# Patient Record
Sex: Female | Born: 1985
Health system: Southern US, Community
[De-identification: ages and names within clinical notes are randomized; demographics above are authoritative.]

## PROBLEM LIST (undated history)

## (undated) DIAGNOSIS — S069X9A Unspecified intracranial injury with loss of consciousness of unspecified duration, initial encounter: Secondary | ICD-10-CM

## (undated) DIAGNOSIS — S069XAA Unspecified intracranial injury with loss of consciousness status unknown, initial encounter: Secondary | ICD-10-CM

## (undated) DIAGNOSIS — I10 Essential (primary) hypertension: Secondary | ICD-10-CM

## (undated) DIAGNOSIS — I255 Ischemic cardiomyopathy: Secondary | ICD-10-CM

## (undated) DIAGNOSIS — R87629 Unspecified abnormal cytological findings in specimens from vagina: Secondary | ICD-10-CM

## (undated) DIAGNOSIS — E785 Hyperlipidemia, unspecified: Secondary | ICD-10-CM

## (undated) DIAGNOSIS — I2510 Atherosclerotic heart disease of native coronary artery without angina pectoris: Secondary | ICD-10-CM

## (undated) HISTORY — DX: Hyperlipidemia, unspecified: E78.5

## (undated) HISTORY — DX: Essential (primary) hypertension: I10

## (undated) HISTORY — DX: Atherosclerotic heart disease of native coronary artery without angina pectoris: I25.10

## (undated) HISTORY — DX: Unspecified abnormal cytological findings in specimens from vagina: R87.629

## (undated) HISTORY — DX: Ischemic cardiomyopathy: I25.5

## (undated) HISTORY — PX: CARDIAC CATHETERIZATION: SHX172

---

## 2014-05-06 ENCOUNTER — Encounter: Payer: Self-pay | Admitting: *Deleted

## 2015-02-07 NOTE — L&D Delivery Note (Addendum)
Delivery Note   Date of Delivery:   10/16/2015 Primary OB:   [facility] Gestational Age/EDD: 3968w0d by 10/23/2015, by Last Menstrual Period  Antepartum complications:  OB History    Gravida Para Term Preterm AB Living   3 1 1   1 1    SAB TAB Ectopic Multiple Live Births     1     1      Delivered By:   Vena AustriaStaebler, Andreas MD  Delivery Type:   TSVD Anesthesia:     IV Analgesia  Intrapartum complications:  GBS:     positive Laceration:     not checked (delivery handed over to Dr. Valentino Saxonherry) Episiotomy:    none Placenta:    Spontaneous Estimated Blood Loss:  pending Baby:     Liveborn female  APGAR (1 MIN): pending APGAR (5 MINS): pending Weight: pending Complications: precipitous delivery, thick meconium.  Remainder of delivery note per Dr. Lamar Laundryherry  Called in because baby was delivery precipitously, delivered without difficulty.  Vigorous at birth      Delivery Summary for UAL CorporationShatoya A [name]  Labor Events:   Preterm labor:   Rupture date:   Rupture time:   Rupture type: Artificial  Fluid Color: Moderate Meconium  Induction:   Augmentation:   Complications:   Cervical ripening:          Delivery:   Episiotomy:   Lacerations:   Repair suture:   Repair # of packets:   Blood loss (ml): 400   Information for the patient's newborn:  Arnetha Courserlston, Girl Beverly [name] [[id number]][[id number]]    Delivery 10/16/2015 2:19 PM by  Vaginal, Spontaneous Delivery Sex:  female Gestational Age: 4568w0d Delivery Clinician:   Living?:         APGARS  One minute Five minutes Ten minutes  Skin color:        Heart rate:        Grimace:        Muscle tone:        Breathing:        Totals: 8  9      Presentation/position:      Resuscitation:   Cord information:    Disposition of cord blood:     Blood gases sent?  Complications:   Placenta: Delivered:       appearance Newborn Measurements: Weight: 8 lb 0.8 oz (3650 g)  Height: 19.69"  Head circumference:    Chest circumference:    Other  providers:    Additional  information: Forceps:   Vacuum:   Breech:   Observed anomalies         Delivery Note At 2:19 PM a viable and healthy female was delivered via Vaginal, Spontaneous Delivery (Presentation: Vertex).  APGAR: 8, 9; weight 8 lb 0.8 oz (3650 g).   Placenta status: intact, spontaneous removal.  Cord: 3-vessel with the following complications: None.  Cord pH: Not obtained.  Anesthesia: IV analgesia Episiotomy:  None Lacerations: 1st degree;Perineal Suture Repair: 2.0 Vicryl Est. Blood Loss (mL): 400  Mom to postpartum.  Baby to Couplet care / Skin to Skin.      [name], MD [facility] 10/16/2015 2:57 PM

## 2015-02-19 DIAGNOSIS — Z3169 Encounter for other general counseling and advice on procreation: Secondary | ICD-10-CM | POA: Diagnosis not present

## 2015-02-19 DIAGNOSIS — Z3201 Encounter for pregnancy test, result positive: Secondary | ICD-10-CM | POA: Diagnosis not present

## 2015-03-26 ENCOUNTER — Ambulatory Visit (INDEPENDENT_AMBULATORY_CARE_PROVIDER_SITE_OTHER): Payer: 59 | Admitting: Obstetrics and Gynecology

## 2015-03-26 VITALS — BP 122/78 | HR 83 | Ht 70.0 in | Wt 226.5 lb

## 2015-03-26 DIAGNOSIS — Z1389 Encounter for screening for other disorder: Secondary | ICD-10-CM

## 2015-03-26 DIAGNOSIS — Z36 Encounter for antenatal screening of mother: Secondary | ICD-10-CM | POA: Diagnosis not present

## 2015-03-26 DIAGNOSIS — Z369 Encounter for antenatal screening, unspecified: Secondary | ICD-10-CM

## 2015-03-26 DIAGNOSIS — Z331 Pregnant state, incidental: Secondary | ICD-10-CM

## 2015-03-26 DIAGNOSIS — Z113 Encounter for screening for infections with a predominantly sexual mode of transmission: Secondary | ICD-10-CM

## 2015-03-26 DIAGNOSIS — R638 Other symptoms and signs concerning food and fluid intake: Secondary | ICD-10-CM | POA: Diagnosis not present

## 2015-03-26 DIAGNOSIS — Z3687 Encounter for antenatal screening for uncertain dates: Secondary | ICD-10-CM

## 2015-03-26 DIAGNOSIS — Z3490 Encounter for supervision of normal pregnancy, unspecified, unspecified trimester: Secondary | ICD-10-CM

## 2015-03-26 NOTE — Patient Instructions (Signed)
Minor Illnesses and Medications in Pregnancy  Cold/Flu:  Sudafed for congestion- Robitussin (plain) for cough- Tylenol for discomfort.  Please follow the directions on the label.  Try not to take any more than needed.  OTC Saline nasal spray and air humidifier or cool-mist  Vaporizer to sooth nasal irritation and to loosen congestion.  It is also important to increase intake of non carbonated fluids, especially if you have a fever.  Constipation:  Colace-2 capsules at bedtime; Metamucil- follow directions on label; Senokot- 1 tablet at bedtime.  Any one of these medications can be used.  It is also very important to increase fluids and fruits along with regular exercise.  If problem persists please call the office.  Diarrhea:  Kaopectate as directed on the label.  Eat a bland diet and increase fluids.  Avoid highly seasoned foods.  Headache:  Tylenol 1 or 2 tablets every 3-4 hours as needed  Indigestion:  Maalox, Mylanta, Tums or Rolaids- as directed on label.  Also try to eat small meals and avoid fatty, greasy or spicy foods.  Nausea with or without Vomiting:  Nausea in pregnancy is caused by increased levels of hormones in the body which influence the digestive system and cause irritation when stomach acids accumulate.  Symptoms usually subside after 1st trimester of pregnancy.  Try the following: 1. Keep saltines, graham crackers or dry toast by your bed to eat upon awakening. 2. Don't let your stomach get empty.  Try to eat 5-6 small meals per day instead of 3 large ones. 3. Avoid greasy fatty or highly seasoned foods.  4. Take OTC Unisom 1 tablet at bed time along with OTC Vitamin B6 25-50 mg 3 times per day.    If nausea continues with vomiting and you are unable to keep down food and fluids you may need a prescription medication.  Please notify your provider.   Sore throat:  Chloraseptic spray, throat lozenges and or plain Tylenol.  Vaginal Yeast Infection:  OTC Monistat for 7 days as  directed on label.  If symptoms do not resolve within a week notify provider.  If any of the above problems do not subside with recommended treatment please call the office for further assistance.   Do not take Aspirin, Advil, Motrin or Ibuprofen.  * * OTC= Over the counter Pregnancy and Zika Virus Disease Zika virus disease, or Zika, is an illness that can spread to people from mosquitoes that carry the virus. It may also spread from person to person through infected body fluids. Zika first occurred in [place], but recently it has spread to new areas. The virus occurs in tropical climates. The location of Zika continues to change. Most people who become infected with Zika virus do not develop serious illness. However, Zika may cause birth defects in an unborn baby whose mother is infected with the virus. It may also increase the risk of miscarriage. WHAT ARE THE SYMPTOMS OF ZIKA VIRUS DISEASE? In many cases, people who have been infected with Zika virus do not develop any symptoms. If symptoms appear, they usually start about a week after the person is infected. Symptoms are usually mild. They may include: 2. Fever. 3. Rash. 4. Red eyes. 5. Joint pain. HOW DOES ZIKA VIRUS DISEASE SPREAD? The main way that Zika virus spreads is through the bite of a certain type of mosquito. Unlike most types of mosquitos, which bite only at night, the type of mosquito that carries Zika virus bites both at night and   during the day. Zika virus can also spread through sexual contact, through a blood transfusion, and from a mother to her baby before or during birth. Once you have had Zika virus disease, it is unlikely that you will get it again. CAN I PASS ZIKA TO MY BABY DURING PREGNANCY? Yes, Zika can pass from a mother to her baby before or during birth. WHAT PROBLEMS CAN ZIKA CAUSE FOR MY BABY? A woman who is infected with Zika virus while pregnant is at risk of having her baby born with a condition in which the  brain or head is smaller than expected (microcephaly). Babies who have microcephaly can have developmental delays, seizures, hearing problems, and vision problems. Having Zika virus disease during pregnancy can also increase the risk of miscarriage. HOW CAN ZIKA VIRUS DISEASE BE PREVENTED? There is no vaccine to prevent Zika. The best way to prevent the disease is to avoid infected mosquitoes and avoid exposure to body fluids that can spread the virus. Avoid any possible exposure to Zika by taking the following precautions. For women and their sex partners:  Avoid traveling to high-risk areas. The locations where Zika is being reported change often. To identify high-risk areas, check the CDC travel website: www.cdc.gov/zika/geo/index.html  If you or your sex partner must travel to a high-risk area, talk with a health care provider before and after traveling.  Take all precautions to avoid mosquito bites if you live in, or travel to, any of the high-risk areas. Insect repellents are safe to use during pregnancy.  Ask your health care provider when it is safe to have sexual contact. For women:  If you are pregnant or trying to become pregnant, avoid sexual contact with persons who may have been exposed to Zika virus, persons who have possible symptoms of Zika, or persons whose history you are unsure about. If you choose to have sexual contact with someone who may have been exposed to Zika virus, use condoms correctly during the entire duration of sexual activity, every time. Do not share sexual devices, as you may be exposed to body fluids.  Ask your health care provider about when it is safe to attempt pregnancy after a possible exposure to Zika virus. WHAT STEPS SHOULD I TAKE TO AVOID MOSQUITO BITES? Take these steps to avoid mosquito bites when you are in a high-risk area:  Wear loose clothing that covers your arms and legs.  Limit your outdoor activities.  Do not open windows unless they  have window screens.  Sleep under mosquito nets.  Use insect repellent. The best insect repellents have:  DEET, picaridin, oil of lemon eucalyptus (OLE), or IR3535 in them.  Higher amounts of an active ingredient in them.  Remember that insect repellents are safe to use during pregnancy.  Do not use OLE on children who are younger than 3 years of age. Do not use insect repellent on babies who are younger than 2 months of age.  Cover your child's stroller with mosquito netting. Make sure the netting fits snugly and that any loose netting does not cover your child's mouth or nose. Do not use a blanket as a mosquito-protection cover.  Do not apply insect repellent underneath clothing.  If you are using sunscreen, apply the sunscreen before applying the insect repellent.  Treat clothing with permethrin. Do not apply permethrin directly to your skin. Follow label directions for safe use.  Get rid of standing water, where mosquitoes may reproduce. Standing water is often found in items such   as buckets, bowls, animal food dishes, and flowerpots. When you return from traveling to any high-risk area, continue taking actions to protect yourself against mosquito bites for 3 weeks, even if you show no signs of illness. This will prevent spreading Zika virus to uninfected mosquitoes. WHAT SHOULD I KNOW ABOUT THE SEXUAL TRANSMISSION OF ZIKA? People can spread Zika to their sexual partners during vaginal, anal, or oral sex, or by sharing sexual devices. Many people with Zika do not develop symptoms, so a person could spread the disease without knowing that they are infected. The greatest risk is to women who are pregnant or who may become pregnant. Zika virus can live longer in semen than it can live in blood. Couples can prevent sexual transmission of the virus by:  Using condoms correctly during the entire duration of sexual activity, every time. This includes vaginal, anal, and oral sex.  Not  sharing sexual devices. Sharing increases your risk of being exposed to body fluid from another person.  Avoiding all sexual activity until your health care provider says it is safe. SHOULD I BE TESTED FOR ZIKA VIRUS? A sample of your blood can be tested for Zika virus. A pregnant woman should be tested if she may have been exposed to the virus or if she has symptoms of Zika. She may also have additional tests done during her pregnancy, such ultrasound testing. Talk with your health care provider about which tests are recommended.   This information is not intended to replace advice given to you by your health care provider. Make sure you discuss any questions you have with your health care provider.   Document Released: 10/14/2014 Document Reviewed: 10/07/2014 Elsevier Interactive Patient Education 2016 Else[employer] Hyperemesis Gravidarum Hyperemesis gravidarum is a severe form of nausea and vomiting that happens during pregnancy. Hyperemesis is worse than morning sickness. It may cause you to have nausea or vomiting all day for many days. It may keep you from eating and drinking enough food and liquids. Hyperemesis usually occurs during the first half (the first 20 weeks) of pregnancy. It often goes away once a woman is in her second half of pregnancy. However, sometimes hyperemesis continues through an entire pregnancy.  CAUSES  The cause of this condition is not completely known but is thought to be related to changes in the body's hormones when pregnant. It could be from the high level of the pregnancy hormone or an increase in estrogen in the body.  SIGNS AND SYMPTOMS  6. Severe nausea and vomiting. 7. Nausea that does not go away. 8. Vomiting that does not allow you to keep any food down. 9. Weight loss and body fluid loss (dehydration). 10. Having no desire to eat or not liking food you have previously enjoyed. DIAGNOSIS  Your health care provider will do a physical exam and ask you  about your symptoms. He or she may also order blood tests and urine tests to make sure something else is not causing the problem.  TREATMENT  You may only need medicine to control the problem. If medicines do not control the nausea and vomiting, you will be treated in the hospital to prevent dehydration, increased acid in the blood (acidosis), weight loss, and changes in the electrolytes in your body that may harm the unborn baby (fetus). You may need IV fluids.  HOME CARE INSTRUCTIONS   Only take over-the-counter or prescription medicines as directed by your health care provider.  Try eating a couple of dry crackers or   toast in the morning before getting out of bed.  Avoid foods and smells that upset your stomach.  Avoid fatty and spicy foods.  Eat 5-6 small meals a day.  Do not drink when eating meals. Drink between meals.  For snacks, eat high-protein foods, such as cheese.  Eat or suck on things that have ginger in them. Ginger helps nausea.  Avoid food preparation. The smell of food can spoil your appetite.  Avoid iron pills and iron in your multivitamins until after 3-4 months of being pregnant. However, consult with your health care provider before stopping any prescribed iron pills. SEEK MEDICAL CARE IF:   Your abdominal pain increases.  You have a severe headache.  You have vision problems.  You are losing weight. SEEK IMMEDIATE MEDICAL CARE IF:   You are unable to keep fluids down.  You vomit blood.  You have constant nausea and vomiting.  You have excessive weakness.  You have extreme thirst.  You have dizziness or fainting.  You have a fever or persistent symptoms for more than 2-3 days.  You have a fever and your symptoms suddenly get worse. MAKE SURE YOU:   Understand these instructions.  Will watch your condition.  Will get help right away if you are not doing well or get worse.   This information is not intended to replace advice given to you  by your health care provider. Make sure you discuss any questions you have with your health care provider.   Document Released: 01/23/2005 Document Revised: 11/13/2012 Document Reviewed: 09/04/2012 Elsevier Interactive Patient Education 2016 Elsevier Inc. Commonly Asked Questions During Pregnancy  Cats: A parasite can be excreted in cat feces.  To avoid exposure you need to have another person empty the little box.  If you must empty the litter box you will need to wear gloves.  Wash your hands after handling your cat.  This parasite can also be found in raw or undercooked meat so this should also be avoided.  Colds, Sore Throats, Flu: Please check your medication sheet to see what you can take for symptoms.  If your symptoms are unrelieved by these medications please call the office.  Dental Work: Most any dental work your dentist recommends is permitted.  X-rays should only be taken during the first trimester if absolutely necessary.  Your abdomen should be shielded with a lead apron during all x-rays.  Please notify your provider prior to receiving any x-rays.  Novocaine is fine; gas is not recommended.  If your dentist requires a note from us prior to dental work please call the office and we will provide one for you.  Exercise: Exercise is an important part of staying healthy during your pregnancy.  You may continue most exercises you were accustomed to prior to pregnancy.  Later in your pregnancy you will most likely notice you have difficulty with activities requiring balance like riding a bicycle.  It is important that you listen to your body and avoid activities that put you at a higher risk of falling.  Adequate rest and staying well hydrated are a must!  If you have questions about the safety of specific activities ask your provider.    Exposure to Children with illness: Try to avoid obvious exposure; report any symptoms to us when noted,  If you have chicken pos, red measles or mumps, you  should be immune to these diseases.   Please do not take any vaccines while pregnant unless you have checked with   your OB provider.  Fetal Movement: After 28 weeks we recommend you do "kick counts" twice daily.  Lie or sit down in a calm quiet environment and count your baby movements "kicks".  You should feel your baby at least 10 times per hour.  If you have not felt 10 kicks within the first hour get up, walk around and have something sweet to eat or drink then repeat for an additional hour.  If count remains less than 10 per hour notify your provider.  Fumigating: Follow your pest control agent's advice as to how long to stay out of your home.  Ventilate the area well before re-entering.  Hemorrhoids:   Most over-the-counter preparations can be used during pregnancy.  Check your medication to see what is safe to use.  It is important to use a stool softener or fiber in your diet and to drink lots of liquids.  If hemorrhoids seem to be getting worse please call the office.   Hot Tubs:  Hot tubs Jacuzzis and saunas are not recommended while pregnant.  These increase your internal body temperature and should be avoided.  Intercourse:  Sexual intercourse is safe during pregnancy as long as you are comfortable, unless otherwise advised by your provider.  Spotting may occur after intercourse; report any bright red bleeding that is heavier than spotting.  Labor:  If you know that you are in labor, please go to the hospital.  If you are unsure, please call the office and let us help you decide what to do.  Lifting, straining, etc:  If your job requires heavy lifting or straining please check with your provider for any limitations.  Generally, you should not lift items heavier than that you can lift simply with your hands and arms (no back muscles)  Painting:  Paint fumes do not harm your pregnancy, but may make you ill and should be avoided if possible.  Latex or water based paints have less odor than  oils.  Use adequate ventilation while painting.  Permanents & Hair Color:  Chemicals in hair dyes are not recommended as they cause increase hair dryness which can increase hair loss during pregnancy.  " Highlighting" and permanents are allowed.  Dye may be absorbed differently and permanents may not hold as well during pregnancy.  Sunbathing:  Use a sunscreen, as skin burns easily during pregnancy.  Drink plenty of fluids; avoid over heating.  Tanning Beds:  Because their possible side effects are still unknown, tanning beds are not recommended.  Ultrasound Scans:  Routine ultrasounds are performed at approximately 20 weeks.  You will be able to see your baby's general anatomy an if you would like to know the gender this can usually be determined as well.  If it is questionable when you conceived you may also receive an ultrasound early in your pregnancy for dating purposes.  Otherwise ultrasound exams are not routinely performed unless there is a medical necessity.  Although you can request a scan we ask that you pay for it when conducted because insurance does not cover " patient request" scans.  Work: If your pregnancy proceeds without complications you may work until your due date, unless your physician or employer advises otherwise.  Round Ligament Pain/Pelvic Discomfort:  Sharp, shooting pains not associated with bleeding are fairly common, usually occurring in the second trimester of pregnancy.  They tend to be worse when standing up or when you remain standing for long periods of time.  These are the result   of pressure of certain pelvic ligaments called "round ligaments".  Rest, Tylenol and heat seem to be the most effective relief.  As the womb and fetus grow, they rise out of the pelvis and the discomfort improves.  Please notify the office if your pain seems different than that described.  It may represent a more serious condition.   

## 2015-03-26 NOTE — Progress Notes (Signed)
I have reviewed the record and concur with information as documented by Fenton Malling, LPN.

## 2015-03-26 NOTE — Progress Notes (Signed)
   Carmin A Raul Del presents for NOB nurse interview visit. G-3.  [id number]. Pregnancy confirmation at [facility] On 02/19/2015 with EDD: 10/24/2015. Pregnancy education material explained and given. No cats in the home. NOB labs ordered. TSH/HbgA1c due to Increased BMI, HIV labs and Drug screen were explained optional and she could opt out of tests but did not decline. Drug screen ordered. Pt has smoked marijuana/cigarettes and stopped after she found out she was pregnant. PNV encouraged. NT to discuss with provider. To schedule ultrasound for dating/viability. Pt. To follow up with provider in 3 weeks for NOB physical.  All questions answered.  ZIKA EXPOSURE SCREEN:  The patient has not traveled to a [place] Virus endemic area within the past 6 months, nor has she had unprotected sex with a partner who has travelled to a [place] endemic region within the past 6 months. The patient has been advised to notify us if these factors change any time during this current pregnancy, so adequate testing and monitoring can be initiated.

## 2015-03-27 LAB — PAIN MGT SCRN (14 DRUGS), UR
AMPHETAMINE SCRN UR: NEGATIVE ng/mL
BUPRENORPHINE, URINE: NEGATIVE ng/mL
Barbiturate Screen, Ur: NEGATIVE ng/mL
Benzodiazepine Screen, Urine: NEGATIVE ng/mL
CANNABINOIDS UR QL SCN: POSITIVE ng/mL
COCAINE(METAB.) SCREEN, URINE: NEGATIVE ng/mL
Creatinine(Crt), U: 308.7 mg/dL — ABNORMAL HIGH (ref 20.0–300.0)
FENTANYL, URINE: NEGATIVE pg/mL
MEPERIDINE SCREEN, URINE: NEGATIVE ng/mL
METHADONE SCREEN, URINE: NEGATIVE ng/mL
OPIATE SCRN UR: NEGATIVE ng/mL
Oxycodone+Oxymorphone Ur Ql Scn: NEGATIVE ng/mL
PCP SCRN UR: NEGATIVE ng/mL
PROPOXYPHENE SCREEN: NEGATIVE ng/mL
Ph of Urine: 5.8 (ref 4.5–8.9)
TRAMADOL UR QL SCN: NEGATIVE ng/mL

## 2015-03-27 LAB — URINALYSIS, ROUTINE W REFLEX MICROSCOPIC
BILIRUBIN UA: NEGATIVE
GLUCOSE, UA: NEGATIVE
LEUKOCYTES UA: NEGATIVE
Nitrite, UA: NEGATIVE
PH UA: 6 (ref 5.0–7.5)
RBC UA: NEGATIVE
UUROB: 1 mg/dL (ref 0.2–1.0)

## 2015-03-27 LAB — NICOTINE SCREEN, URINE: COTININE UR QL SCN: NEGATIVE ng/mL

## 2015-03-28 LAB — CULTURE, OB URINE

## 2015-03-28 LAB — URINE CULTURE, OB REFLEX

## 2015-03-29 LAB — GC/CHLAMYDIA PROBE AMP
CHLAMYDIA, DNA PROBE: NEGATIVE
NEISSERIA GONORRHOEAE BY PCR: NEGATIVE

## 2015-03-30 LAB — ANTIBODY SCREEN: Antibody Screen: NEGATIVE

## 2015-03-30 LAB — HIV ANTIBODY (ROUTINE TESTING W REFLEX): HIV Screen 4th Generation wRfx: NONREACTIVE

## 2015-03-30 LAB — VARICELLA ZOSTER ANTIBODY, IGM: Varicella IgM: <0.91 index (ref 0.00–0.90)

## 2015-03-30 LAB — CBC WITH DIFFERENTIAL/PLATELET
BASOS: 0 %
Basophils Absolute: 0 10*3/uL (ref 0.0–0.2)
EOS (ABSOLUTE): 0.1 10*3/uL (ref 0.0–0.4)
EOS: 1 %
HEMATOCRIT: 35.3 % (ref 34.0–46.6)
Hemoglobin: 11.9 g/dL (ref 11.1–15.9)
IMMATURE GRANULOCYTES: 0 %
Immature Grans (Abs): 0 10*3/uL (ref 0.0–0.1)
LYMPHS ABS: 2.8 10*3/uL (ref 0.7–3.1)
Lymphs: 31 %
MCH: 30 pg (ref 26.6–33.0)
MCHC: 33.7 g/dL (ref 31.5–35.7)
MCV: 89 fL (ref 79–97)
MONOS ABS: 0.6 10*3/uL (ref 0.1–0.9)
Monocytes: 7 %
Neutrophils Absolute: 5.4 10*3/uL (ref 1.4–7.0)
Neutrophils: 61 %
Platelets: 434 10*3/uL — ABNORMAL HIGH (ref 150–379)
RBC: 3.97 x10E6/uL (ref 3.77–5.28)
RDW: 14.3 % (ref 12.3–15.4)
WBC: 9 10*3/uL (ref 3.4–10.8)

## 2015-03-30 LAB — HEMOGLOBIN A1C
Est. average glucose Bld gHb Est-mCnc: 120 mg/dL
Hgb A1c MFr Bld: 5.8 % — ABNORMAL HIGH (ref 4.8–5.6)

## 2015-03-30 LAB — RUBELLA ANTIBODY, IGM

## 2015-03-30 LAB — RH TYPE: Rh Factor: POSITIVE

## 2015-03-30 LAB — ABO

## 2015-03-30 LAB — HEPATITIS B SURFACE ANTIGEN: HEP B S AG: NEGATIVE

## 2015-03-30 LAB — SICKLE CELL SCREEN: Sickle Cell Screen: NEGATIVE

## 2015-03-30 LAB — RPR: RPR Ser Ql: NONREACTIVE

## 2015-03-30 LAB — TSH: TSH: 1.38 u[IU]/mL (ref 0.450–4.500)

## 2015-04-09 ENCOUNTER — Ambulatory Visit (INDEPENDENT_AMBULATORY_CARE_PROVIDER_SITE_OTHER): Payer: 59

## 2015-04-09 DIAGNOSIS — Z3687 Encounter for antenatal screening for uncertain dates: Secondary | ICD-10-CM

## 2015-04-09 DIAGNOSIS — Z36 Encounter for antenatal screening of mother: Secondary | ICD-10-CM | POA: Diagnosis not present

## 2015-04-09 DIAGNOSIS — Z331 Pregnant state, incidental: Secondary | ICD-10-CM

## 2015-04-09 DIAGNOSIS — Z3490 Encounter for supervision of normal pregnancy, unspecified, unspecified trimester: Secondary | ICD-10-CM

## 2015-04-22 ENCOUNTER — Ambulatory Visit (INDEPENDENT_AMBULATORY_CARE_PROVIDER_SITE_OTHER): Payer: 59 | Admitting: Obstetrics and Gynecology

## 2015-04-22 ENCOUNTER — Encounter: Payer: Self-pay | Admitting: Obstetrics and Gynecology

## 2015-04-22 VITALS — BP 111/72 | HR 73 | Wt 230.5 lb

## 2015-04-22 DIAGNOSIS — O99210 Obesity complicating pregnancy, unspecified trimester: Secondary | ICD-10-CM

## 2015-04-22 DIAGNOSIS — Z124 Encounter for screening for malignant neoplasm of cervix: Secondary | ICD-10-CM

## 2015-04-22 DIAGNOSIS — Z3482 Encounter for supervision of other normal pregnancy, second trimester: Secondary | ICD-10-CM | POA: Diagnosis not present

## 2015-04-22 DIAGNOSIS — R110 Nausea: Secondary | ICD-10-CM

## 2015-04-22 DIAGNOSIS — Z3492 Encounter for supervision of normal pregnancy, unspecified, second trimester: Secondary | ICD-10-CM

## 2015-04-22 DIAGNOSIS — Z8742 Personal history of other diseases of the female genital tract: Secondary | ICD-10-CM

## 2015-04-22 DIAGNOSIS — R7303 Prediabetes: Secondary | ICD-10-CM

## 2015-04-22 LAB — POCT URINALYSIS DIPSTICK
Bilirubin, UA: NEGATIVE
Blood, UA: NEGATIVE
Glucose, UA: NEGATIVE
Ketones, UA: 5
LEUKOCYTES UA: NEGATIVE
Nitrite, UA: NEGATIVE
PH UA: 7.5
PROTEIN UA: NEGATIVE
Spec Grav, UA: 1.01
UROBILINOGEN UA: NEGATIVE

## 2015-04-22 MED ORDER — METRONIDAZOLE 500 MG PO TABS: 500.0000 mg | ORAL_TABLET | Freq: Two times a day (BID) | ORAL | Status: DC

## 2015-04-22 NOTE — Progress Notes (Signed)
OBSTETRIC INITIAL PRENATAL VISIT  Subjective:    Beverly [name] is being seen today for her first obstetrical visit.  This is not a planned pregnancy. She is a 30 y.o. 763P1011 female at 9252w5d gestation, Estimated Date of Delivery: 10/23/15 with last menstrual period of 01/16/2015 (approximate), consistent with 7 week sono. Her obstetrical history is significant for obesity (Class I). Relationship with FOB: significant other, not living together. Patient does intend to breast feed. Pregnancy history fully reviewed.  Patient was previously on OCPs (however notes seldomly missing a pill) at time of conception (on pills x 5 months, transitioning from a Nexplanon).    Obstetric History   G3   P1   T1   P0   A1   TAB1   SAB0   E0   M0   L1     # Outcome Date GA Lbr Len/2nd Weight Sex Delivery Anes PTL Lv  3 Current           2 Term 06/10/11 6722w0d  7 lb 5 oz (3.317 kg) M Vag-Spont  N Y  1 TAB 2012        FD      Gynecologic History:  Last pap smear was 2012.  Results were normal.  Admits h/o abnormal pap smear in the past (~age 24, thinks it was HPV+). Was just followed, per patient.  Denies history of STIs.    Past Medical History  Diagnosis Date  . Vaginal Pap smear, abnormal     when she was 30yo  . Hypertension     last pregnancy    Family History  Problem Relation Age of Onset  . Diabetes Maternal Grandmother   . Diabetes Paternal Grandmother   . Hyperlipidemia Mother   . Hypertension Mother   . Rashes / Skin problems Son     ezcema  . Seizures Son   . Seizures Maternal Grandmother   . Thyroid disease Maternal Grandmother     History reviewed. No pertinent past surgical history.    Social History   Social History  . Marital Status: Single    Spouse Name: N/A  . Number of Children: N/A  . 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 Smoking status: Former Games developermoker  . Smokeless tobacco: Never Used  .  Alcohol Use: No  . Drug Use: 5.00 per week    Special: Marijuana     Comment: stopped when she found out she was pregnant  . Sexual Activity:    Partners: Male    Pharmacist, hospitalBirth Control/ Protection: None   Other Topics Concern  . Not on file   Social History Narrative    Current Outpatient Prescriptions on File Prior to Visit  Medication Sig Dispense Refill  . Prenatal Vit-Fe Fumarate-FA (PRENATAL MULTIVITAMIN) TABS tablet Take 1 tablet by mouth daily at 12 noon.     No current facility-administered medications on file prior to visit.    No Known Allergies   Review of Systems General:Positive -Weight Loss; Not Present- Fever, Weight Gain. Skin:Not Present- Rash. HEENT:Not Present- Blurred Vision, Headache and Bleeding Gums. Respiratory:Not Present- Difficulty Breathing. Breast:Not Present- Breast Mass. Cardiovascular:Not Present- Chest Pain, Elevated Blood Pressure, Fainting / Blacking Out and Shortness of Breath. Gastrointestinal:Present - Nausea. Not Present- Abdominal Pain, Constipation, Vomiting. Female Genitourinary:Present - Vaginal discharge (x 2 months, with odor, thin, white). Not Present- Frequency, Painful Urination, Pelvic Pain, Vaginal Bleeding, Vaginal Discharge,  Contractions, regular, Fetal Movements Decreased, Urinary Complaints and Vaginal Fluid. Musculoskeletal:Not Present- Back Pain and Leg Cramps. Neurological:Not Present- Dizziness. Psychiatric:Not Present- Depression.     Objective:   Blood pressure 111/72, pulse 73, weight 230 lb 8 oz (104.554 kg), last menstrual period 01/16/2015.  Body mass index is 33.07 kg/(m^2).  General Appearance:    Alert, cooperative, no distress, appears stated age, mild obesity  Head:    Normocephalic, without obvious abnormality, atraumatic  Eyes:    PERRL, conjunctiva/corneas clear, EOM's intact, both eyes  Ears:    Normal external ear canals, both ears  Nose:   Nares normal, septum midline, mucosa normal, no  drainage or sinus tenderness  Throat:   Lips, mucosa, and tongue normal; teeth and gums normal  Neck:   Supple, symmetrical, trachea midline, no adenopathy; thyroid: no enlargement/tenderness/nodules; no carotid bruit or JVD  Back:     Symmetric, no curvature, ROM normal, no CVA tenderness  Lungs:     Clear to auscultation bilaterally, respirations unlabored  Chest Wall:    No tenderness or deformity   Heart:    Regular rate and rhythm, S1 and S2 normal, no murmur, rub or gallop  Breast Exam:    No tenderness, masses, or nipple abnormality  Abdomen:     Soft, non-tender, bowel sounds active all four quadrants, no masses, no organomegaly.  FHT 153 bpm.  Genitalia:    Pelvic:external genitalia normal, vagina without lesions or tenderness, rectovaginal septum normal.  Moderate amount of thin white discharge, positive whiff test. Cervix normal in appearance, no cervical motion tenderness, no adnexal masses or tenderness.  Pregnancy positive findings: uterine enlargement: 13 wk size, nontender.   Rectal:    Normal external sphincter.  No hemorrhoids appreciated. Internal exam not done.   Extremities:   Extremities normal, atraumatic, no cyanosis or edema  Pulses:   2+ and symmetric all extremities  Skin:   Skin color, texture, turgor normal, no rashes or lesions  Lymph nodes:   Cervical, supraclavicular, and axillary nodes normal  Neurologic:   CNII-XII intact, normal strength, sensation and reflexes throughout      Assessment:   Pregnancy at 13 and 5/7 weeks   Nausea of pregnancy Bacterial vaginosis Mild obesity (Class I) Remote h/o abnormal pap    Plan:   Initial labs reviewed. Pap smear performed today.  Prenatal vitamins encouraged. Problem list reviewed and updated. Counseled on Vitamin B6 and doxylamine.  Patient notes mostly food aversions with associated nausea.  Counseled on avoiding certain foods that can cause intolerance.  Mild obesity with elevated HgbA1c (5.8%).  Will need  early glucola.  To be performed next visit.  Bacterial vaginosis to be treated with Flagyl 500 mg BID x 7 days.  Discussed diagnosis, hygiene precautions.  New OB counseling:  The patient has been given an overview regarding routine prenatal care.  Recommendations regarding diet, weight gain, and exercise in pregnancy were given. Prenatal testing, optional genetic testing, and ultrasound use in pregnancy were reviewed.  AFP3 discussed: undecided.  Given information on Panorama and 1st trimester screen.  Patient understands that if serum screen desired, needs to be performed by next week, otherwise, would need to have 2nd trimester screen.  Benefits of Breast Feeding were discussed. The patient is encouraged to consider nursing her baby post partum. Follow up in 4 weeks.  50% of 30 min visit spent on counseling and coordination of care.

## 2015-04-22 NOTE — Patient Instructions (Signed)

## 2015-04-26 ENCOUNTER — Ambulatory Visit: Payer: Self-pay | Admitting: Physician Assistant

## 2015-04-26 ENCOUNTER — Encounter: Payer: Self-pay | Admitting: Physician Assistant

## 2015-04-26 VITALS — BP 112/70 | Temp 98.5°F

## 2015-04-26 DIAGNOSIS — R21 Rash and other nonspecific skin eruption: Secondary | ICD-10-CM

## 2015-04-26 NOTE — Progress Notes (Signed)
S: c/o rash on abdomen, itchy, no pain, no fever/chills, pt is 14 weeks preg  O: vitals wnl, nad, skin on abdomen with several herald patch appearing lesions, no scaling, no drainage, no vesicles, n/v intact  A: rash  P: reassurance, may just be from pregnancy

## 2015-04-28 ENCOUNTER — Telehealth: Payer: Self-pay | Admitting: Obstetrics and Gynecology

## 2015-04-28 DIAGNOSIS — O2686 Pruritic urticarial papules and plaques of pregnancy (PUPPP): Secondary | ICD-10-CM

## 2015-04-28 LAB — PAP IG, CT-NG, RFX HPV ASCU
CHLAMYDIA, NUC. ACID AMP: NEGATIVE
Gonococcus by Nucleic Acid Amp: NEGATIVE
PAP Smear Comment: 0

## 2015-04-28 MED ORDER — HYDROCORTISONE 2.5 % EX CREA: TOPICAL_CREAM | Freq: Two times a day (BID) | CUTANEOUS | Status: DC

## 2015-04-28 NOTE — Telephone Encounter (Signed)
Called pt no answer, LM informing her that rx would be sent in for hydrocortisone 2.5% cream.

## 2015-04-28 NOTE — Telephone Encounter (Signed)
She went to employee health with rash on her stomach and thigh and breast area. She said they called it pupps. Is there something she can put on it, they wouldn't give her anything b/c she is pregnant. She is about to scratch herself to death. It is very itchy.

## 2015-05-19 ENCOUNTER — Encounter: Payer: 59 | Admitting: Obstetrics and Gynecology

## 2015-05-20 ENCOUNTER — Other Ambulatory Visit: Payer: 59

## 2015-06-11 ENCOUNTER — Other Ambulatory Visit: Payer: Self-pay | Admitting: Obstetrics and Gynecology

## 2015-06-11 ENCOUNTER — Other Ambulatory Visit: Payer: 59

## 2015-06-11 ENCOUNTER — Telehealth: Payer: Self-pay | Admitting: Obstetrics and Gynecology

## 2015-06-11 DIAGNOSIS — Z3493 Encounter for supervision of normal pregnancy, unspecified, third trimester: Secondary | ICD-10-CM

## 2015-06-11 DIAGNOSIS — O99210 Obesity complicating pregnancy, unspecified trimester: Secondary | ICD-10-CM | POA: Diagnosis not present

## 2015-06-11 DIAGNOSIS — Z131 Encounter for screening for diabetes mellitus: Secondary | ICD-10-CM

## 2015-06-11 DIAGNOSIS — R7303 Prediabetes: Secondary | ICD-10-CM | POA: Diagnosis not present

## 2015-06-11 DIAGNOSIS — Z3492 Encounter for supervision of normal pregnancy, unspecified, second trimester: Secondary | ICD-10-CM

## 2015-06-12 LAB — GLUCOSE, 1 HOUR GESTATIONAL: Gestational Diabetes Screen: 135 mg/dL (ref 65–139)

## 2015-06-22 ENCOUNTER — Encounter: Payer: 59 | Admitting: Obstetrics and Gynecology

## 2015-06-22 ENCOUNTER — Ambulatory Visit (INDEPENDENT_AMBULATORY_CARE_PROVIDER_SITE_OTHER): Payer: 59 | Admitting: Obstetrics and Gynecology

## 2015-06-22 ENCOUNTER — Ambulatory Visit (INDEPENDENT_AMBULATORY_CARE_PROVIDER_SITE_OTHER): Payer: 59

## 2015-06-22 VITALS — BP 107/67 | HR 92 | Wt 231.0 lb

## 2015-06-22 DIAGNOSIS — Z1379 Encounter for other screening for genetic and chromosomal anomalies: Secondary | ICD-10-CM | POA: Diagnosis not present

## 2015-06-22 DIAGNOSIS — Z3492 Encounter for supervision of normal pregnancy, unspecified, second trimester: Secondary | ICD-10-CM

## 2015-06-22 DIAGNOSIS — Z3482 Encounter for supervision of other normal pregnancy, second trimester: Secondary | ICD-10-CM

## 2015-06-22 DIAGNOSIS — O0932 Supervision of pregnancy with insufficient antenatal care, second trimester: Secondary | ICD-10-CM

## 2015-06-22 LAB — POCT URINALYSIS DIPSTICK
BILIRUBIN UA: NEGATIVE
Glucose, UA: NEGATIVE
KETONES UA: NEGATIVE
Nitrite, UA: NEGATIVE
PH UA: 6
RBC UA: NEGATIVE
Spec Grav, UA: >=1.03
Urobilinogen, UA: NEGATIVE

## 2015-06-22 NOTE — Progress Notes (Signed)
ROB: Patient doing well.  Denies complaints. Missed last appointment due to son being ill, just now able to reschedule. S/p anatomy scan today (normal but incomplete). Desires Panorama, will order.  RTC in 4 weeks.

## 2015-07-06 ENCOUNTER — Encounter: Payer: Self-pay | Admitting: Obstetrics and Gynecology

## 2015-07-29 ENCOUNTER — Ambulatory Visit (INDEPENDENT_AMBULATORY_CARE_PROVIDER_SITE_OTHER): Payer: 59 | Admitting: Obstetrics and Gynecology

## 2015-07-29 ENCOUNTER — Ambulatory Visit (INDEPENDENT_AMBULATORY_CARE_PROVIDER_SITE_OTHER): Payer: 59

## 2015-07-29 VITALS — BP 104/64 | HR 85 | Wt 237.2 lb

## 2015-07-29 DIAGNOSIS — Z23 Encounter for immunization: Secondary | ICD-10-CM

## 2015-07-29 DIAGNOSIS — R638 Other symptoms and signs concerning food and fluid intake: Secondary | ICD-10-CM

## 2015-07-29 DIAGNOSIS — Z3493 Encounter for supervision of normal pregnancy, unspecified, third trimester: Secondary | ICD-10-CM | POA: Diagnosis not present

## 2015-07-29 DIAGNOSIS — Z131 Encounter for screening for diabetes mellitus: Secondary | ICD-10-CM

## 2015-07-29 DIAGNOSIS — Z3482 Encounter for supervision of other normal pregnancy, second trimester: Secondary | ICD-10-CM | POA: Diagnosis not present

## 2015-07-29 DIAGNOSIS — Z3492 Encounter for supervision of normal pregnancy, unspecified, second trimester: Secondary | ICD-10-CM

## 2015-07-29 LAB — POCT URINALYSIS DIPSTICK
Bilirubin, UA: NEGATIVE
GLUCOSE UA: NEGATIVE
Ketones, UA: NEGATIVE
NITRITE UA: NEGATIVE
RBC UA: NEGATIVE
Spec Grav, UA: 1.01
UROBILINOGEN UA: NEGATIVE
pH, UA: 7.5

## 2015-07-29 MED ORDER — TETANUS-DIPHTH-ACELL PERTUSSIS 5-2.5-18.5 LF-MCG/0.5 IM SUSP
0.5000 mL | Freq: Once | INTRAMUSCULAR | Status: AC
  Administered 2015-07-29: 0.5 mL via INTRAMUSCULAR

## 2015-07-29 NOTE — Progress Notes (Signed)
ROB: Denies complaints. For 28 week labs next visit (patient to late in day to perform glucola). Discussed cord blood banking. Tdap given today, blood consent signed. Discussed contraception, desires Nexplanon. Desires to breast feed. Patient's TWG ok as she has gained back what was lost during early portion of pregnancy (TWG thus 11 lbs). RTC in 2 weeks.

## 2015-07-30 ENCOUNTER — Other Ambulatory Visit: Payer: 59

## 2015-08-20 ENCOUNTER — Other Ambulatory Visit: Payer: 59

## 2015-08-20 DIAGNOSIS — Z131 Encounter for screening for diabetes mellitus: Secondary | ICD-10-CM | POA: Diagnosis not present

## 2015-08-20 DIAGNOSIS — Z3493 Encounter for supervision of normal pregnancy, unspecified, third trimester: Secondary | ICD-10-CM | POA: Diagnosis not present

## 2015-08-21 LAB — GLUCOSE, 1 HOUR GESTATIONAL: Gestational Diabetes Screen: 120 mg/dL (ref 65–139)

## 2015-08-21 LAB — CBC
HEMATOCRIT: 29.9 % — AB (ref 34.0–46.6)
HEMOGLOBIN: 9.4 g/dL — AB (ref 11.1–15.9)
MCH: 27.5 pg (ref 26.6–33.0)
MCHC: 31.4 g/dL — ABNORMAL LOW (ref 31.5–35.7)
MCV: 87 fL (ref 79–97)
Platelets: 331 10*3/uL (ref 150–379)
RBC: 3.42 x10E6/uL — AB (ref 3.77–5.28)
RDW: 15 % (ref 12.3–15.4)
WBC: 7.9 10*3/uL (ref 3.4–10.8)

## 2015-08-27 ENCOUNTER — Telehealth: Payer: Self-pay

## 2015-08-27 DIAGNOSIS — D509 Iron deficiency anemia, unspecified: Secondary | ICD-10-CM

## 2015-08-27 DIAGNOSIS — O99013 Anemia complicating pregnancy, third trimester: Principal | ICD-10-CM

## 2015-08-27 MED ORDER — FERROUS SULFATE 325 (65 FE) MG PO TABS: 325.0000 mg | ORAL_TABLET | Freq: Every day | ORAL | Status: DC

## 2015-08-27 NOTE — Telephone Encounter (Signed)
-----   Message from Hildred LaserAnika Cherry, MD sent at 08/25/2015 12:00 AM EDT ----- Please inform patient of normal glucola, but needs to begin iron supplement twice daily for anemia.  She can get OTC or we can prescribe her something.

## 2015-08-27 NOTE — Telephone Encounter (Signed)
Called pt no answer, LM for pt informing her of information below. RX sent in for iron supplement

## 2015-09-01 ENCOUNTER — Encounter: Payer: Self-pay | Admitting: Obstetrics and Gynecology

## 2015-09-01 ENCOUNTER — Ambulatory Visit (INDEPENDENT_AMBULATORY_CARE_PROVIDER_SITE_OTHER): Payer: 59 | Admitting: Obstetrics and Gynecology

## 2015-09-01 VITALS — BP 104/66 | HR 97 | Wt 241.7 lb

## 2015-09-01 DIAGNOSIS — R638 Other symptoms and signs concerning food and fluid intake: Secondary | ICD-10-CM

## 2015-09-01 DIAGNOSIS — Z3483 Encounter for supervision of other normal pregnancy, third trimester: Secondary | ICD-10-CM

## 2015-09-01 DIAGNOSIS — O99019 Anemia complicating pregnancy, unspecified trimester: Secondary | ICD-10-CM

## 2015-09-01 DIAGNOSIS — D509 Iron deficiency anemia, unspecified: Secondary | ICD-10-CM | POA: Insufficient documentation

## 2015-09-01 DIAGNOSIS — Z3493 Encounter for supervision of normal pregnancy, unspecified, third trimester: Secondary | ICD-10-CM

## 2015-09-01 DIAGNOSIS — O99013 Anemia complicating pregnancy, third trimester: Secondary | ICD-10-CM

## 2015-09-01 LAB — POCT URINALYSIS DIPSTICK
BILIRUBIN UA: NEGATIVE
GLUCOSE UA: NEGATIVE
KETONES UA: 40
NITRITE UA: NEGATIVE
PH UA: 6.5
RBC UA: NEGATIVE
Spec Grav, UA: >=1.03
Urobilinogen, UA: NEGATIVE

## 2015-09-01 MED ORDER — PRENATAL GUMMIES/DHA & FA 0.4-32.5 MG PO CHEW: 2.0000 | CHEWABLE_TABLET | Freq: Every day | ORAL | 3 refills | Status: DC

## 2015-09-01 MED ORDER — POLYSACCHARIDE IRON COMPLEX 150 MG PO CAPS: 150.0000 mg | ORAL_CAPSULE | Freq: Every day | ORAL | Status: DC

## 2015-09-01 NOTE — Progress Notes (Signed)
ROB: Patient denies complaints.  Notes that she has to space visits longer due to her son's multiple doctor's appointments.  S/p normal 1 hr glucola.  Mild anemia noted.  Advised on taking iron tablet daily.  Also encouraged patient to take PNV.  Notes difficulty swallowing. Advised on gummies. RTC in 3 weeks. For 36 week labs at that time.

## 2015-09-22 ENCOUNTER — Encounter: Payer: 59 | Admitting: Obstetrics and Gynecology

## 2015-09-22 NOTE — Progress Notes (Signed)
Patient left before seeing provider.

## 2015-09-28 ENCOUNTER — Ambulatory Visit (INDEPENDENT_AMBULATORY_CARE_PROVIDER_SITE_OTHER): Payer: 59 | Admitting: Obstetrics and Gynecology

## 2015-09-28 VITALS — BP 100/63 | HR 88 | Wt 241.6 lb

## 2015-09-28 DIAGNOSIS — Z3493 Encounter for supervision of normal pregnancy, unspecified, third trimester: Secondary | ICD-10-CM

## 2015-09-28 DIAGNOSIS — O2613 Low weight gain in pregnancy, third trimester: Secondary | ICD-10-CM

## 2015-09-28 DIAGNOSIS — Z113 Encounter for screening for infections with a predominantly sexual mode of transmission: Secondary | ICD-10-CM | POA: Diagnosis not present

## 2015-09-28 DIAGNOSIS — Z36 Encounter for antenatal screening of mother: Secondary | ICD-10-CM | POA: Diagnosis not present

## 2015-09-28 DIAGNOSIS — Z3685 Encounter for antenatal screening for Streptococcus B: Secondary | ICD-10-CM

## 2015-09-28 DIAGNOSIS — O2610 Low weight gain in pregnancy, unspecified trimester: Secondary | ICD-10-CM | POA: Insufficient documentation

## 2015-09-28 DIAGNOSIS — Z3483 Encounter for supervision of other normal pregnancy, third trimester: Secondary | ICD-10-CM

## 2015-09-28 LAB — POCT URINALYSIS DIPSTICK
BILIRUBIN UA: NEGATIVE
Blood, UA: NEGATIVE
Glucose, UA: NEGATIVE
Ketones, UA: 40
NITRITE UA: NEGATIVE
PH UA: 6
Spec Grav, UA: 1.025
Urobilinogen, UA: 0.2

## 2015-09-28 NOTE — Progress Notes (Signed)
ROB: Denies complaints. Does note CSX CorporationBraxton Hicks.  Discussed labor precautions. 36 week labs done today.  TWG 9 lbs (however patient had also lost 6 lbs in 1st trimester, so has truly gained total of 15 lbs this pregnancy).  RTC in 1-2 weeks (needs spacing due to schedule).

## 2015-10-01 LAB — GC/CHLAMYDIA PROBE AMP
Chlamydia trachomatis, NAA: NEGATIVE
NEISSERIA GONORRHOEAE BY PCR: NEGATIVE

## 2015-10-02 LAB — CULTURE, BETA STREP (GROUP B ONLY): STREP GP B CULTURE: POSITIVE — AB

## 2015-10-12 ENCOUNTER — Ambulatory Visit (INDEPENDENT_AMBULATORY_CARE_PROVIDER_SITE_OTHER): Payer: 59 | Admitting: Obstetrics and Gynecology

## 2015-10-12 VITALS — BP 112/70 | HR 90 | Wt 241.9 lb

## 2015-10-12 DIAGNOSIS — O26899 Other specified pregnancy related conditions, unspecified trimester: Secondary | ICD-10-CM

## 2015-10-12 DIAGNOSIS — Z3493 Encounter for supervision of normal pregnancy, unspecified, third trimester: Secondary | ICD-10-CM

## 2015-10-12 DIAGNOSIS — R102 Pelvic and perineal pain: Secondary | ICD-10-CM

## 2015-10-12 DIAGNOSIS — Z3483 Encounter for supervision of other normal pregnancy, third trimester: Secondary | ICD-10-CM

## 2015-10-12 LAB — POCT URINALYSIS DIPSTICK
Bilirubin, UA: NEGATIVE
Blood, UA: NEGATIVE
GLUCOSE UA: NEGATIVE
KETONES UA: NEGATIVE
Leukocytes, UA: NEGATIVE
Nitrite, UA: NEGATIVE
PH UA: 6.5
SPEC GRAV UA: 1.025
Urobilinogen, UA: NEGATIVE

## 2015-10-12 NOTE — Progress Notes (Signed)
ROB: Patient notes increased pelvic pressure and discomfort.  Inquires about elective IOL as she notes that she is miserable.  Advised on Tylenol, warm baths, belly bands. Discussed option of elective IOL at 39 weeks, including risks and benefits.  Patient desires to proceed.  Scheduled for 10/16/2015 (will come in evening before for antibiotics and cervical ripening).

## 2015-10-15 ENCOUNTER — Inpatient Hospital Stay
Admission: EM | Admit: 2015-10-15 | Discharge: 2015-10-18 | DRG: 775 | Disposition: A | Discharge status: Home / Self Care | Payer: 59 | Attending: Obstetrics and Gynecology | Admitting: Obstetrics and Gynecology

## 2015-10-15 DIAGNOSIS — D62 Acute posthemorrhagic anemia: Secondary | ICD-10-CM | POA: Diagnosis present

## 2015-10-15 DIAGNOSIS — Z8249 Family history of ischemic heart disease and other diseases of the circulatory system: Secondary | ICD-10-CM | POA: Diagnosis not present

## 2015-10-15 DIAGNOSIS — O99824 Streptococcus B carrier state complicating childbirth: Secondary | ICD-10-CM | POA: Diagnosis present

## 2015-10-15 DIAGNOSIS — R102 Pelvic and perineal pain: Secondary | ICD-10-CM

## 2015-10-15 DIAGNOSIS — Z833 Family history of diabetes mellitus: Secondary | ICD-10-CM

## 2015-10-15 DIAGNOSIS — Z3A39 39 weeks gestation of pregnancy: Secondary | ICD-10-CM | POA: Diagnosis present

## 2015-10-15 DIAGNOSIS — O9081 Anemia of the puerperium: Secondary | ICD-10-CM | POA: Diagnosis present

## 2015-10-15 DIAGNOSIS — Z87891 Personal history of nicotine dependence: Secondary | ICD-10-CM

## 2015-10-15 DIAGNOSIS — Z3493 Encounter for supervision of normal pregnancy, unspecified, third trimester: Secondary | ICD-10-CM | POA: Diagnosis not present

## 2015-10-15 DIAGNOSIS — Z79899 Other long term (current) drug therapy: Secondary | ICD-10-CM | POA: Diagnosis not present

## 2015-10-15 DIAGNOSIS — O26899 Other specified pregnancy related conditions, unspecified trimester: Secondary | ICD-10-CM

## 2015-10-15 LAB — CBC
HEMATOCRIT: 31.5 % — AB (ref 35.0–47.0)
HEMOGLOBIN: 10.8 g/dL — AB (ref 12.0–16.0)
MCH: 28.1 pg (ref 26.0–34.0)
MCHC: 34.2 g/dL (ref 32.0–36.0)
MCV: 82.4 fL (ref 80.0–100.0)
PLATELETS: 306 10*3/uL (ref 150–440)
RBC: 3.82 MIL/uL (ref 3.80–5.20)
RDW: 17.5 % — ABNORMAL HIGH (ref 11.5–14.5)
WBC: 8.3 10*3/uL (ref 3.6–11.0)

## 2015-10-15 LAB — TYPE AND SCREEN
ABO/RH(D): A POS
Antibody Screen: NEGATIVE

## 2015-10-15 MED ORDER — LACTATED RINGERS IV SOLN
INTRAVENOUS | Status: DC
  Administered 2015-10-15 – 2015-10-16 (×2): via INTRAVENOUS

## 2015-10-15 MED ORDER — SODIUM CHLORIDE 0.9 % IV SOLN
2.0000 g | Freq: Once | INTRAVENOUS | Status: AC
  Administered 2015-10-15: 2 g via INTRAVENOUS
  Filled 2015-10-15: qty 2000

## 2015-10-15 MED ORDER — MISOPROSTOL 25 MCG QUARTER TABLET
25.0000 ug | ORAL_TABLET | ORAL | Status: DC | PRN
  Administered 2015-10-15 – 2015-10-16 (×3): 25 ug via VAGINAL
  Filled 2015-10-15 (×3): qty 1

## 2015-10-15 MED ORDER — SODIUM CHLORIDE 0.9 % IV SOLN
1.0000 g | INTRAVENOUS | Status: DC
  Administered 2015-10-16 (×4): 1 g via INTRAVENOUS
  Filled 2015-10-15 (×4): qty 1000

## 2015-10-15 MED ORDER — ZOLPIDEM TARTRATE 5 MG PO TABS
5.0000 mg | ORAL_TABLET | Freq: Every evening | ORAL | Status: DC | PRN
Start: 2015-10-15 — End: 2015-10-16
  Administered 2015-10-16: 5 mg via ORAL
  Filled 2015-10-15: qty 1

## 2015-10-15 MED ORDER — OXYCODONE-ACETAMINOPHEN 5-325 MG PO TABS: 1.0000 | ORAL_TABLET | ORAL | Status: DC | PRN

## 2015-10-15 MED ORDER — OXYTOCIN 40 UNITS IN LACTATED RINGERS INFUSION - SIMPLE MED
2.5000 [IU]/h | INTRAVENOUS | Status: DC
  Filled 2015-10-15 (×2): qty 1000

## 2015-10-15 MED ORDER — LIDOCAINE HCL (PF) 1 % IJ SOLN
30.0000 mL | INTRAMUSCULAR | Status: DC | PRN
  Administered 2015-10-16: 5 mL via SUBCUTANEOUS
  Filled 2015-10-15: qty 30

## 2015-10-15 MED ORDER — BUTORPHANOL TARTRATE 1 MG/ML IJ SOLN
1.0000 mg | INTRAMUSCULAR | Status: DC | PRN
  Administered 2015-10-16: 1 mg via INTRAVENOUS
  Filled 2015-10-15: qty 1

## 2015-10-15 MED ORDER — ACETAMINOPHEN 325 MG PO TABS: 650.0000 mg | ORAL_TABLET | ORAL | Status: DC | PRN

## 2015-10-15 MED ORDER — OXYTOCIN BOLUS FROM INFUSION
500.0000 mL | Freq: Once | INTRAVENOUS | Status: AC
  Administered 2015-10-16: 500 mL via INTRAVENOUS

## 2015-10-15 MED ORDER — LACTATED RINGERS IV SOLN
500.0000 mL | INTRAVENOUS | Status: DC | PRN
  Administered 2015-10-16: 1000 mL via INTRAVENOUS

## 2015-10-15 MED ORDER — TERBUTALINE SULFATE 1 MG/ML IJ SOLN: 0.2500 mg | Freq: Once | INTRAMUSCULAR | Status: DC | PRN

## 2015-10-15 MED ORDER — OXYCODONE-ACETAMINOPHEN 5-325 MG PO TABS: 2.0000 | ORAL_TABLET | ORAL | Status: DC | PRN

## 2015-10-15 MED ORDER — ONDANSETRON HCL 4 MG/2ML IJ SOLN: 4.0000 mg | Freq: Four times a day (QID) | INTRAMUSCULAR | Status: DC | PRN

## 2015-10-15 MED ORDER — SOD CITRATE-CITRIC ACID 500-334 MG/5ML PO SOLN: 30.0000 mL | ORAL | Status: DC | PRN

## 2015-10-16 ENCOUNTER — Encounter: Payer: Self-pay | Admitting: Obstetrics and Gynecology

## 2015-10-16 DIAGNOSIS — Z3493 Encounter for supervision of normal pregnancy, unspecified, third trimester: Secondary | ICD-10-CM

## 2015-10-16 MED ORDER — LACTATED RINGERS IV SOLN: INTRAVENOUS | Status: DC

## 2015-10-16 MED ORDER — OXYCODONE-ACETAMINOPHEN 5-325 MG PO TABS
1.0000 | ORAL_TABLET | ORAL | Status: DC | PRN
  Administered 2015-10-16 – 2015-10-17 (×4): 1 via ORAL
  Filled 2015-10-16 (×3): qty 1

## 2015-10-16 MED ORDER — IBUPROFEN 600 MG PO TABS
ORAL_TABLET | ORAL | Status: AC
  Administered 2015-10-16: 600 mg via ORAL
  Filled 2015-10-16: qty 1

## 2015-10-16 MED ORDER — DIPHENHYDRAMINE HCL 25 MG PO CAPS
25.0000 mg | ORAL_CAPSULE | Freq: Four times a day (QID) | ORAL | Status: DC | PRN
  Administered 2015-10-17: 25 mg via ORAL
  Filled 2015-10-16: qty 1

## 2015-10-16 MED ORDER — WITCH HAZEL-GLYCERIN EX PADS: 1.0000 "application " | MEDICATED_PAD | CUTANEOUS | Status: DC | PRN

## 2015-10-16 MED ORDER — COCONUT OIL OIL
1.0000 "application " | TOPICAL_OIL | Status: DC | PRN
  Administered 2015-10-17: 1 via TOPICAL
  Filled 2015-10-16: qty 120

## 2015-10-16 MED ORDER — SENNOSIDES-DOCUSATE SODIUM 8.6-50 MG PO TABS
2.0000 | ORAL_TABLET | ORAL | Status: DC
  Administered 2015-10-18: 2 via ORAL
  Filled 2015-10-16: qty 2

## 2015-10-16 MED ORDER — OXYTOCIN 10 UNIT/ML IJ SOLN
INTRAMUSCULAR | Status: AC
  Filled 2015-10-16: qty 2

## 2015-10-16 MED ORDER — ONDANSETRON HCL 4 MG/2ML IJ SOLN: 4.0000 mg | INTRAMUSCULAR | Status: DC | PRN

## 2015-10-16 MED ORDER — AMMONIA AROMATIC IN INHA
RESPIRATORY_TRACT | Status: AC
  Filled 2015-10-16: qty 10

## 2015-10-16 MED ORDER — ONDANSETRON HCL 4 MG PO TABS: 4.0000 mg | ORAL_TABLET | ORAL | Status: DC | PRN

## 2015-10-16 MED ORDER — PRENATAL MULTIVITAMIN CH
1.0000 | ORAL_TABLET | Freq: Every day | ORAL | Status: DC
  Administered 2015-10-17 – 2015-10-18 (×2): 1 via ORAL
  Filled 2015-10-16 (×2): qty 1

## 2015-10-16 MED ORDER — DIBUCAINE 1 % RE OINT: 1.0000 "application " | TOPICAL_OINTMENT | RECTAL | Status: DC | PRN

## 2015-10-16 MED ORDER — IBUPROFEN 600 MG PO TABS
600.0000 mg | ORAL_TABLET | Freq: Four times a day (QID) | ORAL | Status: DC
  Administered 2015-10-16 – 2015-10-18 (×8): 600 mg via ORAL
  Filled 2015-10-16 (×7): qty 1

## 2015-10-16 MED ORDER — OXYCODONE-ACETAMINOPHEN 5-325 MG PO TABS: 2.0000 | ORAL_TABLET | ORAL | Status: DC | PRN

## 2015-10-16 MED ORDER — ACETAMINOPHEN 325 MG PO TABS: 650.0000 mg | ORAL_TABLET | ORAL | Status: DC | PRN

## 2015-10-16 MED ORDER — BENZOCAINE-MENTHOL 20-0.5 % EX AERO
1.0000 "application " | INHALATION_SPRAY | CUTANEOUS | Status: DC | PRN
  Filled 2015-10-16: qty 56

## 2015-10-16 MED ORDER — ZOLPIDEM TARTRATE 5 MG PO TABS: 5.0000 mg | ORAL_TABLET | Freq: Every evening | ORAL | Status: DC | PRN

## 2015-10-16 MED ORDER — OXYCODONE-ACETAMINOPHEN 5-325 MG PO TABS
ORAL_TABLET | ORAL | Status: AC
  Administered 2015-10-16: 1 via ORAL
  Filled 2015-10-16: qty 1

## 2015-10-16 MED ORDER — OXYTOCIN 40 UNITS IN LACTATED RINGERS INFUSION - SIMPLE MED: 1.0000 m[IU]/min | INTRAVENOUS | Status: DC

## 2015-10-16 MED ORDER — MISOPROSTOL 200 MCG PO TABS
ORAL_TABLET | ORAL | Status: AC
  Administered 2015-10-16: 25 ug via VAGINAL
  Filled 2015-10-16: qty 4

## 2015-10-16 MED ORDER — SIMETHICONE 80 MG PO CHEW: 80.0000 mg | CHEWABLE_TABLET | ORAL | Status: DC | PRN

## 2015-10-16 MED ORDER — TETANUS-DIPHTH-ACELL PERTUSSIS 5-2.5-18.5 LF-MCG/0.5 IM SUSP: 0.5000 mL | Freq: Once | INTRAMUSCULAR | Status: DC

## 2015-10-16 NOTE — Progress Notes (Signed)
Intrapartum Progress Note  S: Patient complains of contractions, pain 4/10  O: Blood pressure 117/70, pulse 85, temperature 98.5 F (36.9 C), temperature source Oral, resp. rate 16, height 5\' 9"  (1.753 m), weight 240 lb (108.9 kg), last menstrual period 01/16/2015. Gen App: NAD, mild distress with contractions Abdomen: soft, gravid FHT: baseline 130 bpm.  Accels present.  Decels absent. moderate in degree variability.   Tocometer: contractions q 2-4 minutes Cervix: 2.5-3/60/-3 Extremities: Nontender, no edema.   Labs: No new labs   Assessment:  1: SIUP at 6466w0d 2. Elective IOL at term 3. GBS  Positive  Plan:  1. AROM with moderate meconium. Fetal tracing reassuring.  Continue to monitor, will amnioinfuse if necessary.  2. Augmentation with Pitocin as needed, per protocol. 3. Pain management with IV meds for now, epidural is desired.  4. Continue Ampicillin for GBS prophylaxis 5. Anticipate vaginal delivery   Hildred LaserAnika Kaari Zeigler, MD  [facility] 10/16/2015 12:29 PM

## 2015-10-16 NOTE — H&P (Signed)
Obstetric History and Physical  Beverly [name] is a 30 y.o. G3P1011 with IUP at [redacted]w[redacted]d presenting for elective IOL at term. Patient states she has been having  irregular, every 10-12 minutes contractions, none vaginal bleeding, intact membranes, with active fetal movement.    Prenatal Course Source of Care: [facility] with onset of care at 9 weeks Pregnancy complications or risks: Patient Active Problem List   Diagnosis Date Noted  . Pelvic pain affecting pregnancy 10/15/2015  . Supervision of normal pregnancy in third trimester 09/28/2015  . Poor weight gain of pregnancy 09/28/2015  . Iron deficiency anemia of pregnancy 09/01/2015  . Increased BMI (body mass index) 07/29/2015   She plans to breastfeed She desires Nexplanon for postpartum contraception.   Prenatal labs and studies: ABO, Rh: --/--/A POS (09/08 2044) Antibody: NEG (09/08 2044) Rubella: <20.0 (02/17 1458) RPR: Non Reactive (02/17 1458)  HBsAg: Negative (02/17 1458)  HIV: Non Reactive (02/17 1458)  GBS: Positive (08/22 0430) 1 hr Glucola:  Normal (120) Genetic screening normal Panorama Anatomy US normal   Past Medical History:  Diagnosis Date  . Hypertension    last pregnancy  . Vaginal Pap smear, abnormal    when she was 30yo    No past surgical history on file.  OB History  Gravida Para Term Preterm AB Living  3 1 1   1 1   SAB TAB Ectopic Multiple Live Births    1     1    # Outcome Date GA Lbr Len/2nd Weight Sex Delivery Anes PTL Lv  3 Current           2 Term 06/10/11 [redacted]w[redacted]d  7 lb 5 oz (3.317 kg) M Vag-Spont  N LIV  1 TAB 2012        FD      Social History   Social History  . Marital status: Single    Spouse name: N/A  . Number of children: N/A  . 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 Smoking status: Former Games developer  . Smokeless tobacco: Never Used  . Alcohol use No  . Drug use:     Frequency: 5.0 times per  week    Types: Marijuana     Comment: stopped when she found out she was pregnant  . Sexual activity: Yes    Partners: Male    Birth control/ protection: None   Other Topics Concern  . None   Social History Narrative  . None    Family History  Problem Relation Age of Onset  . Hyperlipidemia Mother   . Hypertension Mother   . Diabetes Maternal Grandmother   . Seizures Maternal Grandmother   . Thyroid disease Maternal Grandmother   . Diabetes Paternal Grandmother   . Rashes / Skin problems Son     ezcema  . Seizures Son     Facility-Administered Medications Prior to Admission  Medication Dose Route Frequency Provider Last Rate Last Dose  . iron polysaccharides (NIFEREX) capsule 150 mg  150 mg Oral Daily [name], MD       Prescriptions Prior to Admission  Medication Sig Dispense Refill Last Dose  . Prenatal MV-Min-FA-Omega-3 (PRENATAL GUMMIES/DHA & FA) 0.4-32.5 MG CHEW Chew 2 tablets by mouth daily. 60 tablet 3 Taking    No Known Allergies  Review of Systems: Negative except for what is mentioned in HPI.  Physical Exam: BP 132/72  Pulse 86   Temp 97.8 F (36.6 C) (Oral)   Resp 18   Ht 5\' 9"  (1.753 m)   Wt 240 lb (108.9 kg)   LMP 01/16/2015 (Approximate)   BMI 35.44 kg/m  CONSTITUTIONAL: Well-developed, well-nourished female in no acute distress.  HENT:  Normocephalic, atraumatic, External right and left ear normal. Oropharynx is clear and moist EYES: Conjunctivae and EOM are normal. Pupils are equal, round, and reactive to light. No scleral icterus.  NECK: Normal range of motion, supple, no masses SKIN: Skin is warm and dry. No rash noted. Not diaphoretic. No erythema. No pallor. NEUROLOGIC: Alert and oriented to person, place, and time. Normal reflexes, muscle tone coordination. No cranial nerve deficit noted. PSYCHIATRIC: Normal mood and affect. Normal behavior. Normal judgment and thought content. CARDIOVASCULAR: Normal heart rate noted, regular  rhythm RESPIRATORY: Effort and breath sounds normal, no problems with respiration noted ABDOMEN: Soft, nontender, nondistended, gravid. MUSCULOSKELETAL: Normal range of motion. No edema and no tenderness. 2+ distal pulses.  Cervical Exam: Dilatation 1 cm   Effacement 40%   Station -3   Presentation: cephalic FHT:  Baseline rate 125 bpm   Variability moderate  Accelerations present   Decelerations none Contractions: occasional   Pertinent Labs/Studies:   Results for orders placed or performed during the hospital encounter of 10/15/15 (from the past 24 hour(s))  CBC     Status: Abnormal   Collection Time: 10/15/15  8:44 PM  Result Value Ref Range   WBC 8.3 3.6 - 11.0 K/uL   RBC 3.82 3.80 - 5.20 MIL/uL   Hemoglobin 10.8 (L) 12.0 - 16.0 g/dL   HCT 19.131.5 (L) 47.835.0 - 29.547.0 %   MCV 82.4 80.0 - 100.0 fL   MCH 28.1 26.0 - 34.0 pg   MCHC 34.2 32.0 - 36.0 g/dL   RDW 62.117.5 (H) 30.811.5 - 65.714.5 %   Platelets 306 150 - 440 K/uL  Type and screen     Status: None   Collection Time: 10/15/15  8:44 PM  Result Value Ref Range   ABO/RH(D) A POS    Antibody Screen NEG    Sample Expiration 10/18/2015     Assessment : Beverly [name] is a 30 y.o. G3P1011 at 132w0d being admitted for elective IOL at term.  Plan: Labor:  Induction with Cytotec, per protocol FWB: Reassuring fetal heart tracing.  GBS positive.  Receiving Ampicillin for GBS prophylaxis.  Delivery plan: Hopeful for vaginal delivery   Hildred LaserAnika Coral Timme, MD [facility]

## 2015-10-16 NOTE — Progress Notes (Signed)
3rd dose of Cytotec placed at 8:12 a.m.   Hildred LaserAnika Donta Fuster, MD

## 2015-10-17 LAB — CBC
HEMATOCRIT: 26.8 % — AB (ref 35.0–47.0)
HEMOGLOBIN: 8.9 g/dL — AB (ref 12.0–16.0)
MCH: 27.4 pg (ref 26.0–34.0)
MCHC: 33.3 g/dL (ref 32.0–36.0)
MCV: 82.3 fL (ref 80.0–100.0)
Platelets: 291 10*3/uL (ref 150–440)
RBC: 3.26 MIL/uL — ABNORMAL LOW (ref 3.80–5.20)
RDW: 17 % — AB (ref 11.5–14.5)
WBC: 11.8 10*3/uL — ABNORMAL HIGH (ref 3.6–11.0)

## 2015-10-17 LAB — RPR: RPR: NONREACTIVE

## 2015-10-17 MED ORDER — FERROUS SULFATE 325 (65 FE) MG PO TABS
325.0000 mg | ORAL_TABLET | Freq: Two times a day (BID) | ORAL | Status: DC
  Administered 2015-10-17 – 2015-10-18 (×2): 325 mg via ORAL
  Filled 2015-10-17 (×2): qty 1

## 2015-10-17 NOTE — Progress Notes (Signed)
Post Partum Day # 1, s/p SVD  Subjective: no complaints, up ad lib, voiding and tolerating PO  Objective: Temp:  [97.5 F (36.4 C)-98.5 F (36.9 C)] 98.3 F (36.8 C) (09/10 0716) Pulse Rate:  [71-96] 93 (09/10 0716) Resp:  [18] 18 (09/10 0716) BP: (108-146)/(69-89) 128/72 (09/10 0716) SpO2:  [97 %-99 %] 99 % (09/10 0716)  Physical Exam:  General: alert and no distress  Lungs: clear to auscultation bilaterally Breasts: normal appearance, no masses or tenderness Heart: regular rate and rhythm, S1, S2 normal, no murmur, click, rub or gallop Pelvis: Lochia: appropriate, Uterine Fundus: firm Extremities: DVT Evaluation: No evidence of DVT seen on physical exam. Negative Homan's sign. No cords or calf tenderness. No significant calf/ankle edema.   Recent Labs  10/15/15 2044 10/17/15 0531  HGB 10.8* 8.9*  HCT 31.5* 26.8*    Assessment/Plan: 30 y.o. Z6X0960G3P2012 female PPD#1 s/p routine SVD at 39 weeks.  Plan for discharge tomorrow, Breastfeeding, Lactation consult and Contraception breastfeeding Mild anemia postpartum, asymptomatic.  Will treat with iron PO supplementation.    LOS: 2 days   Hildred LaserAnika Kahlia Lagunes [facility]

## 2015-10-18 DIAGNOSIS — Z0289 Encounter for other administrative examinations: Secondary | ICD-10-CM

## 2015-10-18 LAB — VARICELLA ZOSTER ANTIBODY, IGG: Varicella IgG: 3315 index (ref >165)

## 2015-10-18 LAB — RUBELLA SCREEN: Rubella: 3.04 index (ref >0.99)

## 2015-10-18 MED ORDER — VARICELLA VIRUS VACCINE LIVE 1350 PFU/0.5ML IJ SUSR
0.5000 mL | Freq: Once | INTRAMUSCULAR | Status: DC
  Filled 2015-10-18: qty 0.5

## 2015-10-18 MED ORDER — IBUPROFEN 800 MG PO TABS: 800.0000 mg | ORAL_TABLET | Freq: Three times a day (TID) | ORAL | 1 refills | Status: DC | PRN

## 2015-10-18 MED ORDER — MEASLES, MUMPS & RUBELLA VAC ~~LOC~~ INJ
0.5000 mL | INJECTION | Freq: Once | SUBCUTANEOUS | Status: DC
  Filled 2015-10-18: qty 0.5

## 2015-10-18 MED ORDER — DOCUSATE SODIUM 100 MG PO CAPS: 100.0000 mg | ORAL_CAPSULE | Freq: Two times a day (BID) | ORAL | 2 refills | Status: DC | PRN

## 2015-10-18 NOTE — Discharge Instructions (Signed)
General Postpartum Discharge Instructions ° °Do not drink alcohol or take tranquilizers.  °Do not take medicine that has not been prescribed by your doctor.  °Take showers instead of baths until your doctor gives you permission to take baths.  °No sexual intercourse or placement of anything in the vagina for 6 weeks or as instructed by your doctor. °Only take prescription or over-the-counter medicines  for pain, discomfort, or fever as directed by your doctor. Take medicines (antibiotics) that kill germs if they are prescribed for you. °  °Call the office or go to the Emergency Room if:  °You feel sick to your stomach (nauseous).  °You start to throw up (vomit).  °You have trouble eating or drinking.  °You have an oral temperature above 101.  °You have constipation that is not helped by adjusting diet or increasing fluid intake. Pain medicines are a common cause of constipation.  °You have foul smelling vaginal discharge or odor.  °You have bleeding requiring changing more than 1 pad per hour. °You have any other concerns. ° °SEEK IMMEDIATE MEDICAL CARE IF:  °You have persistent dizziness.  °You have difficulty breathing or shortness of breath.  °You have an oral temperature above 102.5, not controlled by medicine.  ° ° ° ° °

## 2015-10-18 NOTE — Progress Notes (Signed)
Post Partum Day # 2, s/p SVD  Subjective: no complaints, up ad lib, voiding and tolerating PO  Objective: Temp:  [97.7 F (36.5 C)-98.5 F (36.9 C)] 98.3 F (36.8 C) (09/11 0815) Pulse Rate:  [73-86] 76 (09/11 0815) Resp:  [17-20] 17 (09/11 0815) BP: (122-139)/(73-79) 139/75 (09/11 0815) SpO2:  [98 %-100 %] 98 % (09/11 0815)  Physical Exam:  General: alert and no distress  Lungs: clear to auscultation bilaterally Breasts: normal appearance, no masses or tenderness Heart: regular rate and rhythm, S1, S2 normal, no murmur, click, rub or gallop Pelvis: Lochia: appropriate, Uterine Fundus: firm Extremities: DVT Evaluation: No evidence of DVT seen on physical exam. Negative Homan's sign. No cords or calf tenderness. No significant calf/ankle edema.   Recent Labs  10/15/15 2044 10/17/15 0531  HGB 10.8* 8.9*  HCT 31.5* 26.8*    Assessment/Plan: 30 y.o. Z6X0960G3P2012 female PPD#2 s/p routine SVD at 39 weeks.  Discharge home, Breastfeeding and Contraception Nexplanon Mild anemia postpartum, asymptomatic.  Will treat with iron PO supplementation.    LOS: 3 days   Hildred LaserAnika Megan Presti [facility]

## 2015-10-18 NOTE — Progress Notes (Signed)
Patient understands all discharge instructions and the need to make follow up appointments. Patient discharge via wheelchair with auxillary. 

## 2015-10-18 NOTE — Discharge Summary (Signed)
Obstetric Discharge Summary Reason for Admission: induction of labor, elective at 39 weeks Prenatal Procedures: ultrasound Intrapartum Procedures: spontaneous vaginal delivery Postpartum Procedures: none Complications-Operative and Postpartum: 1st degree perineal laceration Hemoglobin  Date Value Ref Range Status  10/17/2015 8.9 (L) 12.0 - 16.0 g/dL Final   HCT  Date Value Ref Range Status  10/17/2015 26.8 (L) 35.0 - 47.0 % Final   Hematocrit  Date Value Ref Range Status  08/20/2015 29.9 (L) 34.0 - 46.6 % Final    Physical Exam:  General: alert and no distress Lochia: appropriate Uterine Fundus: firm Incision: None DVT Evaluation: No evidence of DVT seen on physical exam. Negative Homan's sign. No cords or calf tenderness. No significant calf/ankle edema.  Discharge Diagnoses: Term Pregnancy-delivered and Mild anemia (secondary to anticipated acute blood loss), asymptomatic  Discharge Information: Date: 10/18/2015 Activity: pelvic rest Diet: routine Medications: PNV, Ibuprofen, Colace and Iron Condition: stable Instructions: refer to practice specific booklet Discharge to: home Follow-up Information    Beverly LaserAnika Cambrie Sonnenfeld, MD. Schedule an appointment as soon as possible for a visit in 6 week(s).   Specialties:  Obstetrics and Gynecology, Radiology Why:  postpartum visit. 8 weeks for Nexplanon insertion Contact information: [address] [place] [address] [phone]           Newborn Data: Live born female  Birth Weight: 8 lb 0.8 oz (3650 g) APGAR: 8, 9  Home with mother.  Beverly Lasernika Stavroula [name] 10/18/2015, 8:52 AM

## 2015-10-19 ENCOUNTER — Encounter: Payer: 59 | Admitting: Obstetrics and Gynecology

## 2015-10-26 ENCOUNTER — Encounter: Payer: 59 | Admitting: Obstetrics and Gynecology

## 2015-11-30 ENCOUNTER — Ambulatory Visit: Payer: 59 | Admitting: Obstetrics and Gynecology

## 2016-01-24 NOTE — Telephone Encounter (Signed)
error 

## 2016-03-16 ENCOUNTER — Ambulatory Visit (INDEPENDENT_AMBULATORY_CARE_PROVIDER_SITE_OTHER): Payer: 59 | Admitting: Obstetrics and Gynecology

## 2016-03-16 ENCOUNTER — Encounter: Payer: Self-pay | Admitting: Obstetrics and Gynecology

## 2016-03-16 VITALS — BP 143/82 | HR 65 | Wt 207.1 lb

## 2016-03-16 DIAGNOSIS — Z3201 Encounter for pregnancy test, result positive: Secondary | ICD-10-CM

## 2016-03-16 DIAGNOSIS — N912 Amenorrhea, unspecified: Secondary | ICD-10-CM

## 2016-03-16 DIAGNOSIS — Z331 Pregnant state, incidental: Secondary | ICD-10-CM | POA: Diagnosis not present

## 2016-03-16 LAB — POCT URINE PREGNANCY: PREG TEST UR: POSITIVE — AB

## 2016-03-16 NOTE — Progress Notes (Signed)
    GYNECOLOGY PROGRESS NOTE  Subjective:    Patient ID: Beverly [name], female    DOB: 02/28/1985, 31 y.o.   MRN: [id number]  HPI  Patient is a 31 y.o. 403P2012 female who presented for discussion of birth control.  Patient's UPT was positive, informed by nurse.  Patient left prior to being seen by MD.

## 2016-04-20 ENCOUNTER — Encounter: Payer: 59 | Admitting: Obstetrics and Gynecology

## 2016-05-18 ENCOUNTER — Ambulatory Visit (INDEPENDENT_AMBULATORY_CARE_PROVIDER_SITE_OTHER): Payer: 59 | Admitting: Obstetrics and Gynecology

## 2016-05-18 ENCOUNTER — Encounter: Payer: Self-pay | Admitting: Obstetrics and Gynecology

## 2016-05-18 VITALS — BP 124/76 | HR 56 | Ht 70.0 in | Wt 203.1 lb

## 2016-05-18 DIAGNOSIS — Z9289 Personal history of other medical treatment: Secondary | ICD-10-CM | POA: Diagnosis not present

## 2016-05-18 DIAGNOSIS — Z30011 Encounter for initial prescription of contraceptive pills: Secondary | ICD-10-CM | POA: Diagnosis not present

## 2016-05-18 MED ORDER — NORETHINDRONE 0.35 MG PO TABS
1.0000 | ORAL_TABLET | Freq: Every day | ORAL | 11 refills | Status: DC
Start: 2016-05-18 — End: 2018-04-17

## 2016-05-18 NOTE — Progress Notes (Signed)
Pt is interested in starting OCPs.

## 2016-05-18 NOTE — Progress Notes (Signed)
     Subjective:    Beverly [name] is a 31 y.o.  G57P2012 female who presents for contraception counseling. The patient has no complaints today. The patient is sexually active. Pertinent past medical history: patient with recent h/o elective pregnancy termination 4-6 weeks ago.    Menstrual History: OB History    Gravida Para Term Preterm AB Living   SAB TAB Ectopic Multiple Live Births     1   0 2      Menarche age: 34 Patient's last menstrual period was 05/14/2016 (exact date).    The following portions of the patient's history were reviewed and updated as appropriate: allergies, current medications, past family history, past medical history, past social history, past surgical history and problem list.  Review of Systems A comprehensive review of systems was negative.   Objective:    BP 124/76   Pulse (!) 56   Ht  (1.778 m)   Wt 203 lb 2 oz (92.1 kg)   LMP 05/14/2016 (Exact Date)   BMI 29.15 kg/m  General appearance: alert and no distress  Remainder of exam deferred.  Assessment:    31 y.o. female seeking contraception, no contraindications.  H/o recent pregnancy termination  Plan:   Reviewed all forms of birth control options available including abstinence; over the counter/barrier methods; hormonal contraceptive medication including pill, patch, ring, injection,contraceptive implant; hormonal and nonhormonal IUDs; permanent sterilization options including vasectomy and the various tubal sterilization modalities. Risks and benefits reviewed.  Questions were answered.  Patient desires a method that will not interfere with her breastfeeding.  Reviewed best options. Patient desires to try mini-pill.  Stressed importance of taking medication at the same time each day. Patient notes understanding.  Instructed on Sunday start as patient's LMP was this week.    [name], MD [facility]

## 2016-08-31 DIAGNOSIS — H5213 Myopia, bilateral: Secondary | ICD-10-CM | POA: Diagnosis not present

## 2016-09-01 DIAGNOSIS — Z0184 Encounter for antibody response examination: Secondary | ICD-10-CM | POA: Diagnosis not present

## 2018-04-17 ENCOUNTER — Ambulatory Visit: Payer: Self-pay | Admitting: Obstetrics and Gynecology

## 2018-04-17 ENCOUNTER — Other Ambulatory Visit: Payer: Self-pay

## 2018-04-17 ENCOUNTER — Encounter: Payer: Self-pay | Admitting: Obstetrics and Gynecology

## 2018-04-17 VITALS — BP 122/68 | HR 82 | Ht 70.0 in | Wt 214.8 lb

## 2018-04-17 DIAGNOSIS — R1909 Other intra-abdominal and pelvic swelling, mass and lump: Secondary | ICD-10-CM

## 2018-04-17 NOTE — Progress Notes (Signed)
    GYNECOLOGY PROGRESS NOTE  Subjective:    Patient ID: Beverly [name], female    DOB: 24-Sep-1985, 33 y.o.   MRN: [id number]  HPI  Patient is a 33 y.o. G21P2012 female who presents for complaints of a growth from her umbilicus, thinks it is a hernia. .  Notes that she first noticed it after the birth of her child in 2017. Has slowly been growing since then, with uptake in growth rate over the past year.  She does report some occasional sharp pains from the area (usually around the time of her cycle). Denies any associated nausea/vomiting, fevers, chills. Desires removal.   The following portions of the patient's history were reviewed and updated as appropriate:  She  has a past medical history of Hypertension and Vaginal Pap smear, abnormal.   She  has no past surgical history on file.   Her family history includes Diabetes in her maternal grandmother and paternal grandmother; Hyperlipidemia in her mother; Hypertension in her mother; Rashes / Skin problems in her son; Seizures in her maternal grandmother and son; Thyroid disease in her maternal grandmother.   She  reports that she has quit smoking. She has never used smokeless tobacco. She reports current alcohol use. She reports that she does not use drugs.    No current outpatient medications on file prior to visit.   Current Facility-Administered Medications on File Prior to Visit  Medication Dose Route Frequency Provider Last Rate Last Dose  . iron polysaccharides (NIFEREX) capsule 150 mg  150 mg Oral Daily [name], MD        She has No Known Allergies..   Review of Systems Pertinent items noted in HPI and remainder of comprehensive ROS otherwise negative.   Objective:   Blood pressure 122/68, pulse 82, height 5\' 10"  (1.778 m), weight 214 lb 12.8 oz (97.4 kg), last menstrual period 04/14/2018, unknown if currently breastfeeding. General appearance: alert and no distress Abdomen: normal findings: bowel sounds normal, no  organomegaly and soft, non-tender Skin: 2 x 4 flesh-colored verrucous mass protruding from the umbilicus, contiguous with the skin, non-tender, mobile, firm. No discharge or drainage noted.   Assessment:   Umbilical mass  Plan:   - Umbilical mass with appearance of possible large keloid vs other etiology present.  By exam does not appear to have underlying umbilical hernia contained but cannot be certain.   Does however appear to be benign.  Discussed findings with patient. Recommend that she see a Development worker, international aid for desired removal.  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 Advised that it will likely be an outpatient procedure.  - To f/u if symptoms worsen prior to being seen by General Surgery.    [name], MD [facility]

## 2018-04-17 NOTE — Progress Notes (Signed)
Pt stated that after the birth of her baby 2017 she noticed sharp pains in her belly button area. Pt stated that she has always had a belly button that you could not see but know she stated it looks like she has 3 belly button. Pt stated that the pain from the area is always there during her cycle.

## 2018-04-26 ENCOUNTER — Encounter: Payer: Self-pay | Admitting: Obstetrics and Gynecology

## 2018-06-18 ENCOUNTER — Telehealth: Payer: Self-pay | Admitting: Obstetrics and Gynecology

## 2018-06-18 NOTE — Telephone Encounter (Signed)
The patient called and stated that she would like to speak with her nurse. The patient is needing to know where her referral was specifically sent so she is able to reach out to them. The patient stated that her issue is getting worse and is hoping to speak with someone as soon as possible. Please advise.

## 2018-06-18 NOTE — Telephone Encounter (Signed)
Spoke with patient and gave information regarding referral. -[facility]

## 2019-09-07 DIAGNOSIS — I213 ST elevation (STEMI) myocardial infarction of unspecified site: Secondary | ICD-10-CM

## 2019-09-07 HISTORY — DX: ST elevation (STEMI) myocardial infarction of unspecified site: I21.3

## 2019-09-23 ENCOUNTER — Other Ambulatory Visit: Payer: Self-pay

## 2019-09-23 ENCOUNTER — Inpatient Hospital Stay
Admission: EM | Admit: 2019-09-23 | Discharge: 2019-09-23 | DRG: 776 | Disposition: A | Discharge status: Other Acute Inpatient Hospital | Payer: Medicaid Other | Attending: Internal Medicine | Admitting: Internal Medicine

## 2019-09-23 ENCOUNTER — Encounter: Payer: Self-pay | Admitting: Emergency Medicine

## 2019-09-23 ENCOUNTER — Emergency Department: Payer: Medicaid Other

## 2019-09-23 ENCOUNTER — Encounter
Admission: EM | Disposition: A | Discharge status: Other Acute Inpatient Hospital | Payer: Self-pay | Source: Home / Self Care | Attending: Internal Medicine

## 2019-09-23 DIAGNOSIS — I5041 Acute combined systolic (congestive) and diastolic (congestive) heart failure: Secondary | ICD-10-CM | POA: Diagnosis present

## 2019-09-23 DIAGNOSIS — I469 Cardiac arrest, cause unspecified: Secondary | ICD-10-CM

## 2019-09-23 DIAGNOSIS — I472 Ventricular tachycardia: Secondary | ICD-10-CM

## 2019-09-23 DIAGNOSIS — I4901 Ventricular fibrillation: Secondary | ICD-10-CM | POA: Diagnosis present

## 2019-09-23 DIAGNOSIS — I462 Cardiac arrest due to underlying cardiac condition: Secondary | ICD-10-CM | POA: Diagnosis present

## 2019-09-23 DIAGNOSIS — I253 Aneurysm of heart: Secondary | ICD-10-CM

## 2019-09-23 DIAGNOSIS — O9943 Diseases of the circulatory system complicating the puerperium: Principal | ICD-10-CM | POA: Diagnosis present

## 2019-09-23 DIAGNOSIS — I4729 Other ventricular tachycardia: Secondary | ICD-10-CM

## 2019-09-23 DIAGNOSIS — I2102 ST elevation (STEMI) myocardial infarction involving left anterior descending coronary artery: Secondary | ICD-10-CM | POA: Diagnosis not present

## 2019-09-23 DIAGNOSIS — I2582 Chronic total occlusion of coronary artery: Secondary | ICD-10-CM | POA: Diagnosis present

## 2019-09-23 DIAGNOSIS — Z87891 Personal history of nicotine dependence: Secondary | ICD-10-CM

## 2019-09-23 DIAGNOSIS — R079 Chest pain, unspecified: Secondary | ICD-10-CM

## 2019-09-23 DIAGNOSIS — I429 Cardiomyopathy, unspecified: Secondary | ICD-10-CM | POA: Diagnosis not present

## 2019-09-23 DIAGNOSIS — R0789 Other chest pain: Secondary | ICD-10-CM | POA: Diagnosis not present

## 2019-09-23 DIAGNOSIS — Z20822 Contact with and (suspected) exposure to covid-19: Secondary | ICD-10-CM | POA: Diagnosis present

## 2019-09-23 DIAGNOSIS — R778 Other specified abnormalities of plasma proteins: Secondary | ICD-10-CM | POA: Diagnosis not present

## 2019-09-23 HISTORY — PX: LEFT HEART CATH AND CORONARY ANGIOGRAPHY: CATH118249

## 2019-09-23 HISTORY — DX: Ventricular fibrillation: I49.01

## 2019-09-23 LAB — BASIC METABOLIC PANEL
Anion gap: 12 (ref 5–15)
Anion gap: 13 (ref 5–15)
BUN: 6 mg/dL (ref 6–20)
BUN: 8 mg/dL (ref 6–20)
CO2: 24 mmol/L (ref 22–32)
CO2: 21 mmol/L — ABNORMAL LOW (ref 22–32)
Calcium: 8.4 mg/dL — ABNORMAL LOW (ref 8.9–10.3)
Calcium: 7.8 mg/dL — ABNORMAL LOW (ref 8.9–10.3)
Chloride: 105 mmol/L (ref 98–111)
Chloride: 103 mmol/L (ref 98–111)
Creatinine, Ser: 0.52 mg/dL (ref 0.44–1.00)
Creatinine, Ser: 0.7 mg/dL (ref 0.44–1.00)
GFR calc Af Amer: >60 mL/min (ref >60)
GFR calc Af Amer: >60 mL/min (ref >60)
GFR calc non Af Amer: >60 mL/min (ref >60)
GFR calc non Af Amer: >60 mL/min (ref >60)
Glucose, Bld: 98 mg/dL (ref 70–99)
Glucose, Bld: 110 mg/dL — ABNORMAL HIGH (ref 70–99)
Potassium: 3.1 mmol/L — ABNORMAL LOW (ref 3.5–5.1)
Potassium: 3.1 mmol/L — ABNORMAL LOW (ref 3.5–5.1)
Sodium: 141 mmol/L (ref 135–145)
Sodium: 137 mmol/L (ref 135–145)

## 2019-09-23 LAB — TROPONIN I (HIGH SENSITIVITY)
Troponin I (High Sensitivity): 127 ng/L (ref <18)
Troponin I (High Sensitivity): 2078 ng/L (ref <18)

## 2019-09-23 LAB — SARS CORONAVIRUS 2 BY RT PCR (HOSPITAL ORDER, PERFORMED IN ~~LOC~~ HOSPITAL LAB): SARS Coronavirus 2: NEGATIVE

## 2019-09-23 LAB — CBC
HCT: 30.5 % — ABNORMAL LOW (ref 36.0–46.0)
HCT: 26.7 % — ABNORMAL LOW (ref 36.0–46.0)
Hemoglobin: 9.9 g/dL — ABNORMAL LOW (ref 12.0–15.0)
Hemoglobin: 8.4 g/dL — ABNORMAL LOW (ref 12.0–15.0)
MCH: 26.5 pg (ref 26.0–34.0)
MCH: 26.8 pg (ref 26.0–34.0)
MCHC: 32.5 g/dL (ref 30.0–36.0)
MCHC: 31.5 g/dL (ref 30.0–36.0)
MCV: 81.8 fL (ref 80.0–100.0)
MCV: 85 fL (ref 80.0–100.0)
Platelets: 569 10*3/uL — ABNORMAL HIGH (ref 150–400)
Platelets: 590 10*3/uL — ABNORMAL HIGH (ref 150–400)
RBC: 3.73 MIL/uL — ABNORMAL LOW (ref 3.87–5.11)
RBC: 3.14 MIL/uL — ABNORMAL LOW (ref 3.87–5.11)
RDW: 16.3 % — ABNORMAL HIGH (ref 11.5–15.5)
RDW: 16.6 % — ABNORMAL HIGH (ref 11.5–15.5)
WBC: 9 10*3/uL (ref 4.0–10.5)
WBC: 20.3 10*3/uL — ABNORMAL HIGH (ref 4.0–10.5)
nRBC: 0 % (ref 0.0–0.2)
nRBC: 0 % (ref 0.0–0.2)

## 2019-09-23 LAB — URINALYSIS, COMPLETE (UACMP) WITH MICROSCOPIC
Bacteria, UA: NONE SEEN
Bilirubin Urine: NEGATIVE
Glucose, UA: NEGATIVE mg/dL
Ketones, ur: 80 mg/dL — AB
Nitrite: NEGATIVE
Protein, ur: NEGATIVE mg/dL
Specific Gravity, Urine: 1.014 (ref 1.005–1.030)
pH: 7 (ref 5.0–8.0)

## 2019-09-23 LAB — BLOOD GAS, ARTERIAL
Acid-base deficit: 2.3 mmol/L — ABNORMAL HIGH (ref 0.0–2.0)
Bicarbonate: 22.4 mmol/L (ref 20.0–28.0)
FIO2: 0.45
MECHVT: 450 mL
O2 Saturation: 94.7 %
PEEP: 5 cmH2O
Patient temperature: 37
RATE: 16 resp/min
pCO2 arterial: 37 mmHg (ref 32.0–48.0)
pH, Arterial: 7.39 (ref 7.350–7.450)
pO2, Arterial: 75 mmHg — ABNORMAL LOW (ref 83.0–108.0)

## 2019-09-23 LAB — BRAIN NATRIURETIC PEPTIDE: B Natriuretic Peptide: 72.4 pg/mL (ref 0.0–100.0)

## 2019-09-23 LAB — TRIGLYCERIDES: Triglycerides: 117 mg/dL (ref <150)

## 2019-09-23 LAB — PROTIME-INR
INR: 1.3 — ABNORMAL HIGH (ref 0.8–1.2)
Prothrombin Time: 16 seconds — ABNORMAL HIGH (ref 11.4–15.2)

## 2019-09-23 LAB — GLUCOSE, CAPILLARY: Glucose-Capillary: 88 mg/dL (ref 70–99)

## 2019-09-23 LAB — MAGNESIUM: Magnesium: 2.1 mg/dL (ref 1.7–2.4)

## 2019-09-23 LAB — APTT: aPTT: 45 seconds — ABNORMAL HIGH (ref 24–36)

## 2019-09-23 SURGERY — LEFT HEART CATH AND CORONARY ANGIOGRAPHY
Anesthesia: Moderate Sedation

## 2019-09-23 MED ORDER — POTASSIUM CHLORIDE 10 MEQ/100ML IV SOLN
10.0000 meq | INTRAVENOUS | Status: AC
  Administered 2019-09-23 (×3): 10 meq via INTRAVENOUS
  Filled 2019-09-23 (×3): qty 100

## 2019-09-23 MED ORDER — MIDAZOLAM HCL 2 MG/2ML IJ SOLN
INTRAMUSCULAR | Status: AC
  Filled 2019-09-23: qty 2

## 2019-09-23 MED ORDER — MAGNESIUM SULFATE 2 GM/50ML IV SOLN
INTRAVENOUS | Status: AC
  Administered 2019-09-23: 2 g
  Filled 2019-09-23: qty 50

## 2019-09-23 MED ORDER — FENTANYL CITRATE (PF) 100 MCG/2ML IJ SOLN
100.0000 ug | INTRAMUSCULAR | Status: AC | PRN
  Administered 2019-09-23 (×3): 100 ug via INTRAVENOUS
  Filled 2019-09-23 (×4): qty 2

## 2019-09-23 MED ORDER — PROPOFOL 1000 MG/100ML IV EMUL
0.0000 ug/kg/min | INTRAVENOUS | Status: DC
  Administered 2019-09-23: 50 ug/kg/min via INTRAVENOUS
  Administered 2019-09-23: 30 ug/kg/min via INTRAVENOUS
  Filled 2019-09-23: qty 100

## 2019-09-23 MED ORDER — IOHEXOL 300 MG/ML  SOLN
INTRAMUSCULAR | Status: DC | PRN
  Administered 2019-09-23: 95 mL

## 2019-09-23 MED ORDER — FAMOTIDINE IN NACL 20-0.9 MG/50ML-% IV SOLN
20.0000 mg | Freq: Two times a day (BID) | INTRAVENOUS | Status: DC
  Filled 2019-09-23: qty 50

## 2019-09-23 MED ORDER — FUROSEMIDE 10 MG/ML IJ SOLN
INTRAMUSCULAR | Status: AC
  Filled 2019-09-23: qty 4

## 2019-09-23 MED ORDER — LIDOCAINE HCL (PF) 1 % IJ SOLN
INTRAMUSCULAR | Status: DC | PRN
  Administered 2019-09-23: 4 mL

## 2019-09-23 MED ORDER — SODIUM CHLORIDE 0.9 % IV SOLN: INTRAVENOUS | Status: DC

## 2019-09-23 MED ORDER — AMIODARONE HCL IN DEXTROSE 360-4.14 MG/200ML-% IV SOLN
60.0000 mg/h | INTRAVENOUS | Status: AC
  Administered 2019-09-23: 60 mg/h via INTRAVENOUS

## 2019-09-23 MED ORDER — IOHEXOL 350 MG/ML SOLN
100.0000 mL | Freq: Once | INTRAVENOUS | Status: AC | PRN
  Administered 2019-09-23: 100 mL via INTRAVENOUS

## 2019-09-23 MED ORDER — BIVALIRUDIN TRIFLUOROACETATE 250 MG IV SOLR
INTRAVENOUS | Status: AC
  Filled 2019-09-23: qty 250

## 2019-09-23 MED ORDER — NOREPINEPHRINE 4 MG/250ML-% IV SOLN: 0.0000 ug/min | INTRAVENOUS | Status: DC

## 2019-09-23 MED ORDER — LIDOCAINE HCL (PF) 1 % IJ SOLN
INTRAMUSCULAR | Status: AC
  Filled 2019-09-23: qty 30

## 2019-09-23 MED ORDER — AMIODARONE HCL IN DEXTROSE 360-4.14 MG/200ML-% IV SOLN
INTRAVENOUS | Status: AC
  Administered 2019-09-23: 60 mg/h via INTRAVENOUS
  Filled 2019-09-23: qty 200

## 2019-09-23 MED ORDER — HEPARIN (PORCINE) IN NACL 1000-0.9 UT/500ML-% IV SOLN
INTRAVENOUS | Status: DC | PRN
  Administered 2019-09-23: 500 mL

## 2019-09-23 MED ORDER — PROPOFOL 1000 MG/100ML IV EMUL
INTRAVENOUS | Status: AC
  Administered 2019-09-23: 5 ug/kg/min
  Filled 2019-09-23: qty 100

## 2019-09-23 MED ORDER — HEPARIN SODIUM (PORCINE) 5000 UNIT/ML IJ SOLN
5000.0000 [IU] | Freq: Three times a day (TID) | INTRAMUSCULAR | Status: DC
  Filled 2019-09-23: qty 1

## 2019-09-23 MED ORDER — HEPARIN (PORCINE) IN NACL 1000-0.9 UT/500ML-% IV SOLN
INTRAVENOUS | Status: AC
  Filled 2019-09-23: qty 1000

## 2019-09-23 MED ORDER — MIDAZOLAM HCL 2 MG/2ML IJ SOLN: 2.0000 mg | INTRAMUSCULAR | Status: DC | PRN

## 2019-09-23 MED ORDER — PROPOFOL 1000 MG/100ML IV EMUL
INTRAVENOUS | Status: AC
  Filled 2019-09-23: qty 100

## 2019-09-23 MED ORDER — FUROSEMIDE 10 MG/ML IJ SOLN
INTRAMUSCULAR | Status: DC | PRN
  Administered 2019-09-23: 40 mg via INTRAVENOUS

## 2019-09-23 MED ORDER — FENTANYL CITRATE (PF) 100 MCG/2ML IJ SOLN
100.0000 ug | INTRAMUSCULAR | Status: DC | PRN
  Administered 2019-09-23: 100 ug via INTRAVENOUS

## 2019-09-23 MED ORDER — FENTANYL CITRATE (PF) 100 MCG/2ML IJ SOLN
INTRAMUSCULAR | Status: AC
  Filled 2019-09-23: qty 2

## 2019-09-23 MED ORDER — ASPIRIN 300 MG RE SUPP
300.0000 mg | RECTAL | Status: AC
  Administered 2019-09-23: 300 mg via RECTAL
  Filled 2019-09-23: qty 1

## 2019-09-23 MED ORDER — AMIODARONE HCL IN DEXTROSE 360-4.14 MG/200ML-% IV SOLN: 30.0000 mg/h | INTRAVENOUS | Status: DC

## 2019-09-23 MED ORDER — MIDAZOLAM HCL 2 MG/2ML IJ SOLN
2.0000 mg | INTRAMUSCULAR | Status: AC | PRN
  Administered 2019-09-23 (×3): 2 mg via INTRAVENOUS
  Filled 2019-09-23 (×3): qty 2

## 2019-09-23 MED ORDER — MIDAZOLAM HCL 2 MG/2ML IJ SOLN
2.0000 mg | Freq: Once | INTRAMUSCULAR | Status: AC
  Administered 2019-09-23: 2 mg via INTRAVENOUS
  Filled 2019-09-23: qty 2

## 2019-09-23 MED ORDER — MIDAZOLAM HCL 2 MG/2ML IJ SOLN
INTRAMUSCULAR | Status: DC | PRN
  Administered 2019-09-23 (×3): 2 mg via INTRAVENOUS

## 2019-09-23 MED ORDER — FENTANYL CITRATE (PF) 100 MCG/2ML IJ SOLN
INTRAMUSCULAR | Status: DC | PRN
  Administered 2019-09-23 (×3): 25 ug via INTRAVENOUS

## 2019-09-23 MED ORDER — AMIODARONE IV BOLUS ONLY 150 MG/100ML
INTRAVENOUS | Status: AC
  Filled 2019-09-23: qty 100

## 2019-09-23 MED ORDER — AMIODARONE HCL IN DEXTROSE 360-4.14 MG/200ML-% IV SOLN
INTRAVENOUS | Status: AC
  Filled 2019-09-23: qty 200

## 2019-09-23 SURGICAL SUPPLY — 9 items
CATH INFINITI 5FR ANG PIGTAIL (CATHETERS) ×2 IMPLANT
CATH INFINITI 5FR JL4 (CATHETERS) ×2 IMPLANT
CATH INFINITI JR4 5F (CATHETERS) ×2 IMPLANT
KIT MANI 3VAL PERCEP (MISCELLANEOUS) ×2 IMPLANT
NEEDLE PERC 18GX7CM (NEEDLE) ×2 IMPLANT
PACK CARDIAC CATH (CUSTOM PROCEDURE TRAY) ×2 IMPLANT
SHEATH AVANTI 5FR X 11CM (SHEATH) ×2 IMPLANT
SHEATH AVANTI 6FR X 11CM (SHEATH) IMPLANT
WIRE GUIDERIGHT .035X150 (WIRE) ×2 IMPLANT

## 2019-09-23 NOTE — Code Documentation (Signed)
Pt intubated by Dr. Larinda Buttery at this time and is being bagged by Dr. Larinda Buttery. Rt on way to room.

## 2019-09-23 NOTE — Code Documentation (Signed)
First Epi given by Herbert Seta RN per Dr. Larinda Buttery order. Pads in place at this time.

## 2019-09-23 NOTE — Code Documentation (Signed)
LR bolus started at this time.

## 2019-09-23 NOTE — H&P (View-Only) (Signed)
Cardiology Consultation:   Patient ID: Beverly [name] MRN: [id number]; DOB: [birth year]  Admit date: 09/23/2019 Date of Consult: 09/23/2019  Primary Care Provider: Patient, No Pcp Per [facility] Cardiologist: New - Nneka Blanda [facility] Electrophysiologist:  None    Patient Profile:   Beverly [name] is a 34 y.o. female with a hx of gestational hypertension who is being seen today for the evaluation of cardiac arrest and abnormal EKG at the request of Dr. Larinda Buttery.  History of Present Illness:   Beverly [name] presented to the emergency department today for evaluation of intermittent chest pain and fatigue.  She is currently intubated and sedated, with history obtained from the ED team, chart, and the patient's mother.  EMS was 1st summoned earlier today due to complaints of vague chest discomfort.  She has had experience this a few weeks earlier while still pregnant but did not seek further evaluation.  Since her emergency C-section 2 weeks ago, Beverly [name] has felt very fatigued.  It should be noted that she had significant anemia around the time of her delivery requiring PRBC transfusion.  Upon arrival in the ED, Beverly [name]'s chest pain had actually resolved.  Initial evaluation was unrevealing other than a mildly elevated high-sensitivity troponin of 127.  Initial EKG showed normal sinus rhythm with left posterior fascicular block, poor R wave progression, and nonspecific ST segment changes.  She suddenly became unresponsive and was noted to have polymorphic VT that degenerated rapidly to VF.  CPR was started and the patient ultimately successfully resuscitated after multiple defibrillations.  Initial post ROSC EKG showed sinus tachycardia versus atrial flutter with widespread ST segment elevation.  Repeat EKG then showed resolution of ST elevation with global ST depression.  Most recent tracing demonstrates sinus tachycardia with nonspecific ST changes.   Past Medical History:  Diagnosis Date     Hypertension    last pregnancy   Vaginal Pap smear, abnormal    when she was 34yo    History reviewed. No pertinent surgical history.   Home Medications:  Prior to Admission medications   Medication Sig Start Date [place] Rose[place]ar Date Taking? Authorizing Provider  acetaminophen (TYLENOL) 500 MG tablet Take 500-1,000 mg by mouth every 6 (six) hours as needed for mild pain or fever.   Yes [provider]  ibuprofen (ADVIL) 200 MG tablet Take 400-800 mg by mouth every 6 (six) hours as needed for fever or mild pain.   Yes [provider]  oxyCODONE (OXY IR/ROXICODONE) 5 MG immediate release tablet Take 5 mg by mouth every 4 (four) hours as needed for severe pain.   Yes [provider]    Inpatient Medications: Scheduled Meds:  heparin  5,000 Units Subcutaneous Q8H   iron polysaccharides  150 mg Oral Daily   Continuous Infusions:  sodium chloride 10 mL/hr at 09/23/19 1915   amiodarone 60 mg/hr (09/23/19 1743)   amiodarone     famotidine (PEPCID) IV     norepinephrine (LEVOPHED) Adult infusion     potassium chloride 10 mEq (09/23/19 1920)   propofol (DIPRIVAN) infusion 40 mcg/kg/min (09/23/19 1854)   PRN Meds: fentaNYL (SUBLIMAZE) injection, fentaNYL (SUBLIMAZE) injection, iohexol, midazolam, midazolam  Allergies:   No Known Allergies  Social History:   Social History   Socioeconomic History   Marital status: Single    Spouse name: Not on file   Number of children: Not on file   Years of education: Not on file   Highest education level: Not on file  Occupational History   Occupation: oncology    Employer: [employer]  Tobacco Use   Smoking status: Former Smoker   Smokeless tobacco: Never Used  Building services engineer Use: Never used  Substance and Sexual Activity   Alcohol use: Yes    Alcohol/week: 0.0 standard drinks    Comment: occass   Drug use: No    Frequency: 5.0 times per week    Types: Marijuana    Comment: stopped when  she found out she was pregnant   Sexual activity: Not Currently    Partners: Male    Birth control/protection: None  Other Topics Concern   Not on file  Social History Narrative   Not on file   Social Determinants of Health   Financial Resource Strain:    Difficulty of Paying Living Expenses:   Food Insecurity:    Worried About Programme researcher, broadcasting/film/video in the Last Year:    Barista in the Last Year:   Transportation Needs:    Freight forwarder (Medical):    Lack of Transportation (Non-Medical):   Physical Activity:    Days of Exercise per Week:    Minutes of Exercise per Session:   Stress:    Feeling of Stress :   Social Connections:    Frequency of Communication with Friends and Family:    Frequency of Social Gatherings with Friends and Family:    Attends Religious Services:    Active Member of Clubs or Organizations:    Attends Engineer, structural:    Marital Status:   Intimate Partner Violence:    Fear of Current or Ex-Partner:    Emotionally Abused:    Physically Abused:    Sexually Abused:     Family History:   Family History  Problem Relation Age of Onset   Hyperlipidemia Mother    Hypertension Mother    Diabetes Maternal Grandmother    Seizures Maternal Grandmother    Thyroid disease Maternal Grandmother    Diabetes Paternal Grandmother    Rashes / Skin problems Son        ezcema   Seizures Son      ROS:  Review of Systems  Unable to perform ROS: Intubated    Physical Exam/Data:   Vitals:   09/23/19 1855 09/23/19 1910 09/23/19 1915 09/23/19 1941  BP: 113/83 126/85 126/86 118/86  Pulse: (!) 126 (!) 119 (!) 117 (!) 118  Resp: (!) 29 (!) 29 (!) 28 (!) 23  Temp:      TempSrc:      SpO2: 100% 100% 100% 100%  Weight:      Height:       No intake or output data in the 24 hours ending 09/23/19 2009 Last 3 Weights 09/23/2019 04/17/2018 05/18/2016  Weight (lbs) 235 lb 214 lb 12.8 oz 203 lb 2 oz  Weight  (kg) 106.595 kg 97.433 kg 92.137 kg  Some encounter information is confidential and restricted. Go to Review Flowsheets activity to see all data.     Body mass index is 33.72 kg/m.  General: Intubated and sedated.  Mother and significant other at the bedside. HEENT: Endotracheal tube in place. Lymph: no adenopathy Neck: Unable to assess JVP due to positioning and support devices. Endocrine:  No thryomegaly Vascular: 1+ radial pulses bilaterally. Cardiac: Tachycardic but regular without murmurs, rubs, or gallops. Lungs: Clear anteriorly without wheezes or crackles. Abd: soft, nontender, no hepatomegaly  Ext: no edema Musculoskeletal:  No deformities  Skin: warm and dry  Neuro: Intubated and sedated.  Not responsive to verbal or tactile stimuli. Psych: Unable to assess due to intubation/sedation.  EKG:  The EKG from today at 19:24 was personally reviewed and demonstrates: Sinus tachycardia with left axis deviation, septal Q waves, and nonspecific ST segment changes. Telemetry:  Telemetry was personally reviewed and demonstrates: Sinus rhythm and sinus tachycardia.  At 17:15, patient had sudden onset of polymorphic ventricular tachycardia degenerating to ventricular fibrillation.  Relevant CV Studies: Limited bedside echo demonstrates normal LV size with LVEF approximately 35%.  There appears to be mid and apical anterior/anterolateral as well as apical inferior hypokinesis/akinesis.  A small pericardial effusion is also noted.  Laboratory Data:  High Sensitivity Troponin:   Recent Labs  Lab 09/23/19 1539  TROPONINIHS 127*     Chemistry Recent Labs  Lab 09/23/19 1539  NA 141  K 3.1*  CL 105  CO2 24  GLUCOSE 98  BUN 6  CREATININE 0.52  CALCIUM 8.4*  GFRNONAA >60  GFRAA >60  ANIONGAP 12    No results for input(s): PROT, ALBUMIN, AST, ALT, ALKPHOS, BILITOT in the last 168 hours. Hematology Recent Labs  Lab 09/23/19 1539 09/23/19 1844  WBC 9.0 20.3*  RBC 3.73* 3.14*    HGB 9.9* 8.4*  HCT 30.5* 26.7*  MCV 81.8 85.0  MCH 26.5 26.8  MCHC 32.5 31.5  RDW 16.3* 16.6*  PLT 569* 590*   BNP Recent Labs  Lab 09/23/19 1539  BNP 72.4    DDimer No results for input(s): DDIMER in the last 168 hours.   Radiology/Studies:  DG Chest 2 View  Result Date: 09/23/2019 CLINICAL DATA:  34 year old female with chest pain EXAM: CHEST - 2 VIEW COMPARISON:  None. FINDINGS: Small bilateral pleural effusions, right greater left. Minimal bibasilar atelectasis. Infiltrate is not excluded clinical correlation is recommended. No pneumothorax. There is mild cardiomegaly. No acute osseous pathology. IMPRESSION: 1. Small bilateral pleural effusions, right greater left. 2. Mild cardiomegaly. Electronically Signed   By: Elgie CollardArash  Radparvar M.D.   On: 09/23/2019 16:19   DG Abdomen 1 View  Result Date: 09/23/2019 CLINICAL DATA:  Intubated, enteric catheter placement EXAM: ABDOMEN - 1 VIEW COMPARISON:  None. FINDINGS: Two frontal views of the lower chest and upper abdomen demonstrates enteric catheter tip projecting over gastric body. Defibrillator pad overlies the cardiac apex. Bowel gas pattern is unremarkable. IMPRESSION: 1. Enteric catheter overlying gastric body. Electronically Signed   By: Sharlet SalinaMichael  Brown M.D.   On: 09/23/2019 18:43   DG Chest Portable 1 View  Result Date: 09/23/2019 CLINICAL DATA:  Chest pain, intubated EXAM: PORTABLE CHEST 1 VIEW COMPARISON:  09/23/2019 FINDINGS: Single frontal view of the chest demonstrates endotracheal tube overlying tracheal air column tip 1.6 cm above carina. Enteric catheter passes below the diaphragm tip excluded by collimation. External defibrillator pads overlie the cardiac apex. The cardiac silhouette is enlarged but stable. Progressive central vascular congestion with small bilateral pleural effusions. No pneumothorax. IMPRESSION: 1. Support devices as above. 2. Progressive central vascular congestion with trace bilateral pleural effusion.  Electronically Signed   By: Sharlet SalinaMichael  Brown M.D.   On: 09/23/2019 18:42    Assessment and Plan:   Cardiac arrest: Patient noted to have sudden onset of polymorphic VT degenerating to VF for which she was successfully resuscitated.  EKG prior to this showed some subtle nonspecific ST segment changes but no obvious ischemia.  Given patient's age and recent delivery, considerations include SCAD leading to ischemia mediated VT/VF as  well as acute PE.  Patient is currently undergoing CT to exclude PE as well as intracranial and abdominal pathology.  If no clear etiology for the patient's cardiac arrest is identified, we will plan to proceed with left heart catheterization and possible PCI this evening.  I have spoken with the patient's mother, Beverly [name], who has provided consent on her daughter's behalf.  I have reviewed the risks, indications, and alternatives to cardiac catheterization, possible angioplasty, and stenting with the patient. Risks include but are not limited to bleeding, infection, vascular injury, stroke, myocardial infection, arrhythmia, kidney injury, radiation-related injury in the case of prolonged fluoroscopy use, emergency cardiac surgery, and death. The patient understands the risks of serious complication is 1-2 in 1000 with diagnostic cardiac cath and 1-2% or less with angioplasty/stenting.  In the meantime, patient should be maintained on amiodarone.  If blood pressure tolerates, low-dose beta-blocker will be added as well.  Defer targeted temperature therapy to ED and critical care teams.  Elevated troponin and cardiomyopathy: Concerning for ACS/SCAD.  PE or stress-induced cardiomyopathy would be other considerations.  Patient currently undergoing CTA of the chest to exclude PE.  If no obvious cause for her cardiomyopathy/cardiac arrest is identified by CT imaging, we'll plan to proceed with urgent cardiac catheterization as above.  Unless she has total occlusion of a major  epicardial coronary artery, I would favor conservative management.  If blood pressure tolerates, consider addition of low-dose beta-blocker later today as well as ACE inhibitor/ARB prior to discharge.  I suggest trending troponin until it has peaked, then stop.  Heparin should be deferred pending exclusion of SCAD.  Disposition: Patient to be admitted to ICU following aforementioned work-up, with further management of ventilator support per critical care team.  For questions or updates, please contact [facility] Please consult [email or web address] for contact info under [facility].  Signed, Yvonne Kendall, MD  09/23/2019 8:09 PM

## 2019-09-23 NOTE — Interval H&P Note (Signed)
History and Physical Interval Note:  09/23/2019 9:51 PM  Beverly [name]  has presented today for surgery, with the diagnosis of chest pain and cardiac arrest.  The various methods of treatment have been discussed with the patient and family. After consideration of risks, benefits and other options for treatment, the patient has consented to  Procedure(s): Coronary/Graft Acute MI Revascularization (N/A) LEFT HEART CATH AND CORONARY ANGIOGRAPHY (N/A) as a surgical intervention.  The patient's history has been reviewed, patient examined, no change in status, stable for surgery.  I have reviewed the patient's chart and labs.  Questions were answered to the patient's satisfaction.  Cath Lab Visit (complete for each Cath Lab visit)  Clinical Evaluation Leading to the Procedure:   ACS: Yes.    Non-ACS:  N/A  [name]

## 2019-09-23 NOTE — ED Notes (Signed)
Waiting for respiratory to take patient to CT at this time.

## 2019-09-23 NOTE — Code Documentation (Signed)
Pt shocked at this time x1.

## 2019-09-23 NOTE — Consult Note (Addendum)
°Cardiology Consultation:  ° °Patient ID: Beverly [name] °MRN: [id number]; DOB: 07/11/1985 ° °Admit date: 09/23/2019 °Date of Consult: 09/23/2019 ° °Primary Care Provider: Patient, No Pcp Per °[facility] Cardiologist: New - Tessy Pawelski °[facility] Electrophysiologist:  None  ° ° °Patient Profile:  ° °Beverly [name] is a 34 y.o. female with a hx of gestational hypertension who is being seen today for the evaluation of cardiac arrest and abnormal EKG at the request of Dr. Jessup. ° °History of Present Illness:  ° °Beverly [name] presented to the emergency department today for evaluation of intermittent chest pain and fatigue.  She is currently intubated and sedated, with history obtained from the ED team, chart, and the patient's mother.  EMS was 1st summoned earlier today due to complaints of vague chest discomfort.  She has had experience this a few weeks earlier while still pregnant but did not seek further evaluation.  Since her emergency C-section 2 weeks ago, Beverly [name] has felt very fatigued.  It should be noted that she had significant anemia around the time of her delivery requiring PRBC transfusion. ° °Upon arrival in the ED, Beverly [name]'s chest pain had actually resolved.  Initial evaluation was unrevealing other than a mildly elevated high-sensitivity troponin of 127.  Initial EKG showed normal sinus rhythm with left posterior fascicular block, poor R wave progression, and nonspecific ST segment changes.  She suddenly became unresponsive and was noted to have polymorphic VT that degenerated rapidly to VF.  CPR was started and the patient ultimately successfully resuscitated after multiple defibrillations.  Initial post ROSC EKG showed sinus tachycardia versus atrial flutter with widespread ST segment elevation.  Repeat EKG then showed resolution of ST elevation with global ST depression.  Most recent tracing demonstrates sinus tachycardia with nonspecific ST changes. ° ° °Past Medical History:  °Diagnosis Date    °• Hypertension   ° last pregnancy  °• Vaginal Pap smear, abnormal   ° when she was 34yo  ° ° °History reviewed. No pertinent surgical history.  ° °Home Medications:  °Prior to Admission medications   °Medication Sig Start Date Isolde Skaff Date Taking? Authorizing Provider  °acetaminophen (TYLENOL) 500 MG tablet Take 500-1,000 mg by mouth every 6 (six) hours as needed for mild pain or fever.   Yes [provider]  °ibuprofen (ADVIL) 200 MG tablet Take 400-800 mg by mouth every 6 (six) hours as needed for fever or mild pain.   Yes [provider]  °oxyCODONE (OXY IR/ROXICODONE) 5 MG immediate release tablet Take 5 mg by mouth every 4 (four) hours as needed for severe pain.   Yes [provider]  ° ° °Inpatient Medications: °Scheduled Meds: °• heparin  5,000 Units Subcutaneous Q8H  °• iron polysaccharides  150 mg Oral Daily  ° °Continuous Infusions: °• sodium chloride 10 mL/hr at 09/23/19 1915  °• amiodarone 60 mg/hr (09/23/19 1743)  °• amiodarone    °• famotidine (PEPCID) IV    °• norepinephrine (LEVOPHED) Adult infusion    °• potassium chloride 10 mEq (09/23/19 1920)  °• propofol (DIPRIVAN) infusion 40 mcg/kg/min (09/23/19 1854)  ° °PRN Meds: °fentaNYL (SUBLIMAZE) injection, fentaNYL (SUBLIMAZE) injection, iohexol, midazolam, midazolam ° °Allergies:   No Known Allergies ° °Social History:   °Social History  ° °Socioeconomic History  °• Marital status: Single  °  Spouse name: Not on file  °• Number of children: Not on file  °• Years of education: Not on file  °• Highest education level: Not on file  °  Occupational History  °• Occupation: oncology  °  Employer: [employer]  °Tobacco Use  °• Smoking status: Former Smoker  °• Smokeless tobacco: Never Used  °Vaping Use  °• Vaping Use: Never used  °Substance and Sexual Activity  °• Alcohol use: Yes  °  Alcohol/week: 0.0 standard drinks  °  Comment: occass  °• Drug use: No  °  Frequency: 5.0 times per week  °  Types: Marijuana  °  Comment: stopped when  she found out she was pregnant  °• Sexual activity: Not Currently  °  Partners: Male  °  Birth control/protection: None  °Other Topics Concern  °• Not on file  °Social History Narrative  °• Not on file  ° °Social Determinants of Health  ° °Financial Resource Strain:   °• Difficulty of Paying Living Expenses:   °Food Insecurity:   °• Worried About Running Out of Food in the Last Year:   °• Ran Out of Food in the Last Year:   °Transportation Needs:   °• Lack of Transportation (Medical):   °• Lack of Transportation (Non-Medical):   °Physical Activity:   °• Days of Exercise per Week:   °• Minutes of Exercise per Session:   °Stress:   °• Feeling of Stress :   °Social Connections:   °• Frequency of Communication with Friends and Family:   °• Frequency of Social Gatherings with Friends and Family:   °• Attends Religious Services:   °• Active Member of Clubs or Organizations:   °• Attends Club or Organization Meetings:   °• Marital Status:   °Intimate Partner Violence:   °• Fear of Current or Ex-Partner:   °• Emotionally Abused:   °• Physically Abused:   °• Sexually Abused:   °  °Family History:   °Family History  °Problem Relation Age of Onset  °• Hyperlipidemia Mother   °• Hypertension Mother   °• Diabetes Maternal Grandmother   °• Seizures Maternal Grandmother   °• Thyroid disease Maternal Grandmother   °• Diabetes Paternal Grandmother   °• Rashes / Skin problems Son   °     ezcema  °• Seizures Son   °  ° °ROS:  °Review of Systems  °Unable to perform ROS: Intubated  ° ° °Physical Exam/Data:  ° °Vitals:  ° 09/23/19 1855 09/23/19 1910 09/23/19 1915 09/23/19 1941  °BP: 113/83 126/85 126/86 118/86  °Pulse: (!) 126 (!) 119 (!) 117 (!) 118  °Resp: (!) 29 (!) 29 (!) 28 (!) 23  °Temp:      °TempSrc:      °SpO2: 100% 100% 100% 100%  °Weight:      °Height:      ° °No intake or output data in the 24 hours ending 09/23/19 2009 °Last 3 Weights 09/23/2019 04/17/2018 05/18/2016  °Weight (lbs) 235 lb 214 lb 12.8 oz 203 lb 2 oz  °Weight  (kg) 106.595 kg 97.433 kg 92.137 kg  °Some encounter information is confidential and restricted. Go to Review Flowsheets activity to see all data.  °   °Body mass index is 33.72 kg/m².  °General: Intubated and sedated.  Mother and significant other at the bedside. °HEENT: Endotracheal tube in place. °Lymph: no adenopathy °Neck: Unable to assess JVP due to positioning and support devices. °Endocrine:  No thryomegaly °Vascular: 1+ radial pulses bilaterally. °Cardiac: Tachycardic but regular without murmurs, rubs, or gallops. °Lungs: Clear anteriorly without wheezes or crackles. °Abd: soft, nontender, no hepatomegaly  °Ext: no edema °Musculoskeletal:  No deformities °  Skin: warm and dry  °Neuro: Intubated and sedated.  Not responsive to verbal or tactile stimuli. °Psych: Unable to assess due to intubation/sedation. ° °EKG:  The EKG from today at 19:24 was personally reviewed and demonstrates: Sinus tachycardia with left axis deviation, septal Q waves, and nonspecific ST segment changes. °Telemetry:  Telemetry was personally reviewed and demonstrates: Sinus rhythm and sinus tachycardia.  At 17:15, patient had sudden onset of polymorphic ventricular tachycardia degenerating to ventricular fibrillation. ° °Relevant CV Studies: °Limited bedside echo demonstrates normal LV size with LVEF approximately 35%.  There appears to be mid and apical anterior/anterolateral as well as apical inferior hypokinesis/akinesis.  A small pericardial effusion is also noted. ° °Laboratory Data: ° °High Sensitivity Troponin:   °Recent Labs  °Lab 09/23/19 °1539  °TROPONINIHS 127*  °   °Chemistry °Recent Labs  °Lab 09/23/19 °1539  °NA 141  °K 3.1*  °CL 105  °CO2 24  °GLUCOSE 98  °BUN 6  °CREATININE 0.52  °CALCIUM 8.4*  °GFRNONAA >60  °GFRAA >60  °ANIONGAP 12  °  °No results for input(s): PROT, ALBUMIN, AST, ALT, ALKPHOS, BILITOT in the last 168 hours. °Hematology °Recent Labs  °Lab 09/23/19 °1539 09/23/19 °1844  °WBC 9.0 20.3*  °RBC 3.73* 3.14*    °HGB 9.9* 8.4*  °HCT 30.5* 26.7*  °MCV 81.8 85.0  °MCH 26.5 26.8  °MCHC 32.5 31.5  °RDW 16.3* 16.6*  °PLT 569* 590*  ° °BNP °Recent Labs  °Lab 09/23/19 °1539  °BNP 72.4  °  °DDimer No results for input(s): DDIMER in the last 168 hours. ° ° °Radiology/Studies:  °DG Chest 2 View ° °Result Date: 09/23/2019 °CLINICAL DATA:  34-year-old female with chest pain EXAM: CHEST - 2 VIEW COMPARISON:  None. FINDINGS: Small bilateral pleural effusions, right greater left. Minimal bibasilar atelectasis. Infiltrate is not excluded clinical correlation is recommended. No pneumothorax. There is mild cardiomegaly. No acute osseous pathology. IMPRESSION: 1. Small bilateral pleural effusions, right greater left. 2. Mild cardiomegaly. Electronically Signed   By: Arash  Radparvar M.D.   On: 09/23/2019 16:19  ° °DG Abdomen 1 View ° °Result Date: 09/23/2019 °CLINICAL DATA:  Intubated, enteric catheter placement EXAM: ABDOMEN - 1 VIEW COMPARISON:  None. FINDINGS: Two frontal views of the lower chest and upper abdomen demonstrates enteric catheter tip projecting over gastric body. Defibrillator pad overlies the cardiac apex. Bowel gas pattern is unremarkable. IMPRESSION: 1. Enteric catheter overlying gastric body. Electronically Signed   By: Michael  Brown M.D.   On: 09/23/2019 18:43  ° °DG Chest Portable 1 View ° °Result Date: 09/23/2019 °CLINICAL DATA:  Chest pain, intubated EXAM: PORTABLE CHEST 1 VIEW COMPARISON:  09/23/2019 FINDINGS: Single frontal view of the chest demonstrates endotracheal tube overlying tracheal air column tip 1.6 cm above carina. Enteric catheter passes below the diaphragm tip excluded by collimation. External defibrillator pads overlie the cardiac apex. The cardiac silhouette is enlarged but stable. Progressive central vascular congestion with small bilateral pleural effusions. No pneumothorax. IMPRESSION: 1. Support devices as above. 2. Progressive central vascular congestion with trace bilateral pleural effusion.  Electronically Signed   By: Michael  Brown M.D.   On: 09/23/2019 18:42  ° ° °Assessment and Plan:  ° °Cardiac arrest: °Patient noted to have sudden onset of polymorphic VT degenerating to VF for which she was successfully resuscitated.  EKG prior to this showed some subtle nonspecific ST segment changes but no obvious ischemia.  Given patient's age and recent delivery, considerations include SCAD leading to ischemia mediated VT/VF as   well as acute PE.  Patient is currently undergoing CT to exclude PE as well as intracranial and abdominal pathology.  If no clear etiology for the patient's cardiac arrest is identified, we will plan to proceed with left heart catheterization and possible PCI this evening.  I have spoken with the patient's mother, Yolonda Scotton, who has provided consent on her daughter's behalf.  I have reviewed the risks, indications, and alternatives to cardiac catheterization, possible angioplasty, and stenting with the patient. Risks include but are not limited to bleeding, infection, vascular injury, stroke, myocardial infection, arrhythmia, kidney injury, radiation-related injury in the case of prolonged fluoroscopy use, emergency cardiac surgery, and death. The patient understands the risks of serious complication is 1-2 in 1000 with diagnostic cardiac cath and 1-2% or less with angioplasty/stenting.  In the meantime, patient should be maintained on amiodarone.  If blood pressure tolerates, low-dose beta-blocker will be added as well.  Defer targeted temperature therapy to ED and critical care teams. ° °Elevated troponin and cardiomyopathy: °Concerning for ACS/SCAD.  PE or stress-induced cardiomyopathy would be other considerations.  Patient currently undergoing CTA of the chest to exclude PE.  If no obvious cause for her cardiomyopathy/cardiac arrest is identified by CT imaging, we'll plan to proceed with urgent cardiac catheterization as above.  Unless she has total occlusion of a major  epicardial coronary artery, I would favor conservative management.  If blood pressure tolerates, consider addition of low-dose beta-blocker later today as well as ACE inhibitor/ARB prior to discharge.  I suggest trending troponin until it has peaked, then stop.  Heparin should be deferred pending exclusion of SCAD. ° °Disposition: °Patient to be admitted to ICU following aforementioned work-up, with further management of ventilator support per critical care team. ° °For questions or updates, please contact [facility] °Please consult [email or web address] for contact info under [facility]. ° °Signed, °Terri Rorrer, MD  °09/23/2019 8:09 PM ° °

## 2019-09-23 NOTE — Code Documentation (Signed)
Epi given by Preston Fleeting.

## 2019-09-23 NOTE — ED Notes (Signed)
Pt states that she has had slight chest pain and has felt slightly off and that's why she called EMS. NAD noted and pt placed on cardiac leads by this Rn at this time.

## 2019-09-23 NOTE — ED Notes (Signed)
Pt changed at this time by this Rn and Mac Rn. Patient had jelly like orange diarrhea. Pt sheets changed at this time, clean new chux and brief placed under patient. Patient in bed at this time and appears to be comfortable.

## 2019-09-23 NOTE — ED Provider Notes (Signed)
[facility] Emergency Department Provider Note   ____________________________________________   First MD Initiated Contact with Patient 09/23/19 1640     (approximate)  I have reviewed the triage vital signs and the nursing notes.   HISTORY  Chief Complaint Chest Pain    HPI Beverly [name] is a 34 y.o. female with past medical history of gestational hypertension who presents to the ED complaining of chest pain.  Patient reports that she had an emergency C-section performed 2 weeks ago at [facility] due to premature rupture of membranes with breech presentation.  She had no issues following delivery and was discharged on time, per chart had mildly elevated blood pressure during admission which was not treated.  She developed a dull ache in her chest shortly after eating earlier today that has since resolved.  She denies any associated shortness of breath, had some leg swelling following delivery that has since resolved.  She denies any recent fevers or cough.        Past Medical History:  Diagnosis Date  . Hypertension    last pregnancy  . Vaginal Pap smear, abnormal    when she was 34yo    Patient Active Problem List   Diagnosis Date Noted  . Cardiac arrest ([place]) 09/23/2019  . Pelvic pain affecting pregnancy 10/15/2015  . Supervision of normal pregnancy in third trimester 09/28/2015  . Poor weight gain of pregnancy 09/28/2015  . Iron deficiency anemia of pregnancy 09/01/2015  . Increased BMI (body mass index) 07/29/2015    History reviewed. No pertinent surgical history.  Prior to Admission medications   Medication Sig Start Date End Date Taking? Authorizing Provider  acetaminophen (TYLENOL) 500 MG tablet Take 500-1,000 mg by mouth every 6 (six) hours as needed for mild pain or fever.   Yes [provider]  ibuprofen (ADVIL) 200 MG tablet Take 400-800 mg by mouth every 6 (six) hours as needed for fever or mild pain.   Yes [provider]  oxyCODONE (OXY IR/ROXICODONE) 5 MG immediate release tablet Take 5 mg by mouth every 4 (four) hours as needed for severe pain.   Yes [provider]    Allergies Patient has no known allergies.  Family History  Problem Relation Age of Onset  . Hyperlipidemia Mother   . Hypertension Mother   . Diabetes Maternal Grandmother   . Seizures Maternal Grandmother   . Thyroid disease Maternal Grandmother   . Diabetes Paternal Grandmother   . Rashes / Skin problems Son        ezcema  . Seizures Son     Social History Social History   Tobacco Use  . Smoking status: Former Research scientist (life sciences)  . Smokeless tobacco: Never Used  Vaping Use  . Vaping Use: Never used  Substance Use Topics  . Alcohol use: Yes    Alcohol/week: 0.0 standard drinks    Comment: occass  . Drug use: No    Frequency: 5.0 times per week    Types: Marijuana    Comment: stopped when she found out she was pregnant    Review of Systems  Constitutional: No fever/chills Eyes: No visual changes. ENT: No sore throat. Cardiovascular: Positive for chest pain. Respiratory: Denies shortness of breath. Gastrointestinal: No abdominal pain.  No nausea, no vomiting.  No diarrhea.  No constipation. Genitourinary: Negative for dysuria. Musculoskeletal: Negative for back pain. Skin: Negative for rash. Neurological: Negative for headaches, focal weakness or numbness.  ____________________________________________   PHYSICAL EXAM:  VITAL  SIGNS: ED Triage Vitals  Enc Vitals Group     BP 09/23/19 1522 (!) 149/92     Pulse Rate 09/23/19 1522 85     Resp 09/23/19 1522 16     Temp 09/23/19 1522 98.9 F (37.2 C)     Temp Source 09/23/19 1522 Oral     SpO2 09/23/19 1522 97 %     Weight 09/23/19 1551 235 lb (106.6 kg)     Height 09/23/19 1551 _0  (1.778 m)     Head Circumference --      Peak Flow --      Pain Score 09/23/19 1525 0     Pain Loc --      Pain Edu? --      Excl. in [place]? --      Constitutional: Alert and oriented. Eyes: Conjunctivae are normal. Head: Atraumatic. Nose: No congestion/rhinnorhea. Mouth/Throat: Mucous membranes are moist. Neck: Normal ROM Cardiovascular: Normal rate, regular rhythm. Grossly normal heart sounds. Respiratory: Normal respiratory effort.  No retractions. Lungs CTAB. Gastrointestinal: Soft and mildly tender to palpation diffusely. C-section site clean, dry, and intact. No distention. Genitourinary: deferred Musculoskeletal: No lower extremity tenderness nor edema. Neurologic:  Normal speech and language. No gross focal neurologic deficits are appreciated. Skin:  Skin is warm, dry and intact. No rash noted. Psychiatric: Mood and affect are normal. Speech and behavior are normal.  ____________________________________________   LABS (all labs ordered are listed, but only abnormal results are displayed)  Labs Reviewed  BASIC METABOLIC PANEL - Abnormal; Notable for the following components:      Result Value   Potassium 3.1 (*)    Calcium 8.4 (*)    All other components within normal limits  CBC - Abnormal; Notable for the following components:   RBC 3.73 (*)    Hemoglobin 9.9 (*)    HCT 30.5 (*)    RDW 16.3 (*)    Platelets 569 (*)    All other components within normal limits  URINALYSIS, COMPLETE (UACMP) WITH MICROSCOPIC - Abnormal; Notable for the following components:   Color, Urine YELLOW (*)    APPearance HAZY (*)    Hgb urine dipstick MODERATE (*)    Ketones, ur 80 (*)    Leukocytes,Ua SMALL (*)    All other components within normal limits  PROTIME-INR - Abnormal; Notable for the following components:   Prothrombin Time 16.0 (*)    INR 1.3 (*)    All other components within normal limits  APTT - Abnormal; Notable for the following components:   aPTT 45 (*)    All other components within normal limits  BLOOD GAS, ARTERIAL - Abnormal; Notable for the following components:   pO2, Arterial 75 (*)    Acid-base  deficit 2.3 (*)    All other components within normal limits  CBC - Abnormal; Notable for the following components:   WBC 20.3 (*)    RBC 3.14 (*)    Hemoglobin 8.4 (*)    HCT 26.7 (*)    RDW 16.6 (*)    Platelets 590 (*)    All other components within normal limits  TROPONIN I (HIGH SENSITIVITY) - Abnormal; Notable for the following components:   Troponin I (High Sensitivity) 127 (*)    All other components within normal limits  SARS CORONAVIRUS 2 BY RT PCR (HOSPITAL ORDER, [facility])  BRAIN NATRIURETIC PEPTIDE  TRIGLYCERIDES  MAGNESIUM  GLUCOSE, CAPILLARY  BLOOD GAS, ARTERIAL  BASIC METABOLIC PANEL  BLOOD GAS, ARTERIAL  BASIC METABOLIC PANEL  POC URINE PREG, ED  TROPONIN I (HIGH SENSITIVITY)   ____________________________________________  EKG  ED ECG REPORT I, Blake Divine, the attending physician, personally viewed and interpreted this ECG.   Date: 09/23/2019  EKG Time: 15:16  Rate: 86  Rhythm: normal sinus rhythm  Axis: Normal  Intervals:none  ST&T Change: Q waves inveriorly  ED ECG REPORT I, Blake Divine, the attending physician, personally viewed and interpreted this ECG.   Date: 09/23/2019  EKG Time: 17:41  Rate: 140  Rhythm: normal sinus rhythm vs atrial flutter  Axis: LAD  Intervals:Prolonged QT  ST&T Change: ST elevation inferiorly and anteriorly  ED ECG REPORT I, Blake Divine, the attending physician, personally viewed and interpreted this ECG.   Date: 09/23/2019  EKG Time: 18:00  Rate: 115  Rhythm: sinus tachycardia  Axis: Normal  Intervals:Prolonged QT  ST&T Change: Anterolateral Q waves     PROCEDURES  Procedure(s) performed (including Critical Care):  Procedure Name: Intubation Date/Time: 09/23/2019 6:55 PM Performed by: Blake Divine, MD Pre-anesthesia Checklist: Patient identified, Patient being monitored, Emergency Drugs available, Timeout performed and Suction available Oxygen Delivery  Method: Ambu bag Preoxygenation: Pre-oxygenation with 100% oxygen Ventilation: Mask ventilation without difficulty Laryngoscope Size: Mac and 4 Grade View: Grade I Tube size: 7.0 mm Number of attempts: 1 Airway Equipment and Method: Video-laryngoscopy Placement Confirmation: ETT inserted through vocal cords under direct vision,  CO2 detector and Breath sounds checked- equal and bilateral Secured at: 24 cm Tube secured with: ETT holder    .Critical Care Performed by: Blake Divine, MD Authorized by: Blake Divine, MD   Critical care provider statement:    Critical care time (minutes):  70   Critical care time was exclusive of:  Separately billable procedures and treating other patients and teaching time   Critical care was necessary to treat or prevent imminent or life-threatening deterioration of the following conditions:  Cardiac failure   Critical care was time spent personally by me on the following activities:  Discussions with consultants, evaluation of patient's response to treatment, examination of patient, ordering and performing treatments and interventions, ordering and review of laboratory studies, ordering and review of radiographic studies, pulse oximetry, re-evaluation of patient's condition, obtaining history from patient or surrogate and review of old charts   I assumed direction of critical care for this patient from another provider in my specialty: no       ____________________________________________   INITIAL IMPRESSION / ASSESSMENT AND PLAN / ED COURSE       34 year old female with possible history of gestational hypertension presents to the ED complaining of central chest ache that has since resolved.  She is now 2 weeks postpartum following emergency C-section at [facility] but had uncomplicated course after this.  Blood pressure noted to be mildly elevated here, however this is consistent with her history of gestational hypertension and I do not suspect  preeclampsia given no protein in her urine.  Chest x-ray does show bilateral pleural effusions with mild cardiomegaly, patient additionally noted to have mildly elevated troponin at 127.  Presentation concerning for postpartum cardiomyopathy.  Case discussed with Dr. Amalia Hailey of OB/GYN, who recommends further consultation with cardiology.  Repeat troponin pending at this time.  ----------------------------------------- 6:56 PM on 09/23/2019 -----------------------------------------  Called to patient's room when she was found to be in cardiac arrest.  CPR initiated and initial rhythm appears consistent with ventricular fibrillation.  Patient given epinephrine x1 and defibrillated 3 times, after which ROSC  was achieved.  She was given amiodarone bolus and started on drip, also given 2 g of magnesium.  Initial EKG following ROSC has inferior and anterior ST elevation, case discussed with Dr. Saunders Revel of cardiology who unfortunately is already in the Cath Lab with another patient.  He recommends discussion with [facility] for potential transfer to Cath Lab.  Case discussed with Dr. Irish Lack at [facility] and EKG at this time appears improved with resolution of ST elevation.  Heart rate is also improved and it is possible that the ST elevation was due to atrial flutter.  Dr. Irish Lack states that transfer for Cath Lab not indicated at this time as ACS seems less likely to be the etiology of patient's cardiac arrest.  He will further discuss with Dr. Saunders Revel once he is done in the Cath Lab.  I also spoke with Dr. Harrell Gave of cardiology who agrees with plan to hold off on heparin, would consider scad which we will further evaluate for with CTA to additionally assess for PE.  Patient remains hemodynamically stable at this time, is waking up more and fighting the ventilator, but no obvious purposeful movement. Differential also includes torsades as latest EKG shows prolonged QT, patient given IV magnesium. Case  discussed with Dr. Mortimer Fries in the ICU for admission.  ----------------------------------------- 9:02 PM on 09/23/2019 -----------------------------------------  CT head is negative for acute process, CTA of chest shows no evidence of PE but does show atelectasis versus infiltrate.  I would favor atelectasis given she did not have any symptoms of pneumonia prior to her arrest.  She has a trace amount of fluid intrabdominally, likely related to her recent C-section.  Patient was evaluated by Dr. Saunders Revel of cardiology, who will plan on taking patient to the Cath Lab.      ____________________________________________   FINAL CLINICAL IMPRESSION(S) / ED DIAGNOSES  Final diagnoses:  Cardiac arrest ([place])  Nonspecific chest pain     ED Discharge Orders    None       Note:  This document was prepared using Dragon voice recognition software and may include unintentional dictation errors.   Blake Divine, MD 09/23/19 2103

## 2019-09-23 NOTE — Code Documentation (Addendum)
Pt shocked for 3rd time and 4th time. 200 joules for each shock.

## 2019-09-23 NOTE — Progress Notes (Signed)
   On call STEMI pacer activated at [facility] for this patient at [facility], as [facility] cath team already doing a case.  Was called for possible STEMI in this 34 y/o who is 2 weeks postpartum.  I reviewed the ECGs.  Initial ECG did not show significant ST elevation.  After VF arrest, ECG showed HR in th 140 with markedly abnormal ST changes.  Possible atrial flutter, given the rate.    After [place], ECG showed sinus tach with mild, diffuse ST depression which seems compatible with recent cardiac arrest. D/w Dr. Larinda Buttery.  Doubt the underlying pathology is STEMI.  Cancel code STEMI and continue with workup.  Stat echo would be helpful as well to look for cardiac issues.   Corky Crafts, MD

## 2019-09-23 NOTE — Code Documentation (Signed)
2 mg of magnesium started by Herbert Seta RN.

## 2019-09-23 NOTE — Code Documentation (Signed)
Propafol drip started at 5 at this time per Dr. Larinda Buttery by Herbert Seta RN.

## 2019-09-23 NOTE — ED Notes (Signed)
Pt back from CT at this time 

## 2019-09-23 NOTE — Code Documentation (Signed)
Epi given by Heather RN.  

## 2019-09-23 NOTE — Code Documentation (Signed)
Amiodarone drip started a 60 mg/hr.

## 2019-09-23 NOTE — H&P (Addendum)
Cardiology Admission History and Physical:   Patient ID: Beverly [name] MRN: [id number]; DOB: 1985-04-10   Admission date: 09/23/19  Primary Care Provider: Patient, No Pcp Per [facility] Cardiologist: No primary care provider on file.  [facility] Electrophysiologist:  None   Chief Complaint:  Cardiac Arrest  Patient Profile:   Beverly [name] is a 34 y.o. female with gestational hypertension and recent C-section two weeks prior.   History of Present Illness:   [name] presented to the ED 09/23/19 with intermittent chest discomfort.  Had discomfort during her pregnancy (third trimester) but was accomapnied by increase fatigue post delivery.  Patient had pRBCs for deliver but deliver was otherwise uneventful.  In [facility]  ED hsTrop 127.  CP had resolved.  Initial EKG:  Sinus rhythm, broad T waves without TWI; qtc 464.  VF Arrest at 1727:  PMVT-VF.  Defib X 4; Mg Amio 300 mg total and drip, and Epi given.  Change in ST morphology at 1741 and 1800 post arrest.    Bedside Echo suggestive of LAD disease with EF 35%.  LHC showed an ostial LAD lesion and dissection flap in the ostium could not be ruled out.  Amiodarone drip continue; sedation started; patient intubated as part of CODE BLUE.  Through EMS Care, patient had occasional gag and RR over set rate; given fentanyl by EMS.  At [facility], BP 118/70 with heart rate 110.  EKG showed sinus tach with improvement of ST changes (worse at 1741).  No further VT.  Family notes that she had one episode 6 months back of "heart fluttering."  Notes that she mentioned it again after taking tylenol two weeks prior. No family hx of SCD, unexplained cardiomyopathy, accidental death (swimming/car crash).  No hx of arrythmia; maternal grandmother had heart surgery as an older woman NOS in the setting of smoking and medication non-adherence.  No issues with two other pregnancies.   Past Medical History:  Diagnosis Date  . Hypertension     last pregnancy  . Vaginal Pap smear, abnormal    when she was 34yo    No past surgical history on file.   Medications Prior to Admission: Prior to Admission medications   Medication Sig Start Date End Date Taking? Authorizing Provider  acetaminophen (TYLENOL) 500 MG tablet Take 500-1,000 mg by mouth every 6 (six) hours as needed for mild pain or fever.    [provider]  ibuprofen (ADVIL) 200 MG tablet Take 400-800 mg by mouth every 6 (six) hours as needed for fever or mild pain.    [provider]  oxyCODONE (OXY IR/ROXICODONE) 5 MG immediate release tablet Take 5 mg by mouth every 4 (four) hours as needed for severe pain.    [provider]    Allergies:   No Known Allergies  Social History:   Social History   Socioeconomic History  . Marital status: Single    Spouse name: Not on file  . Number of children: Not on file  . Years of education: Not on file  . Highest education level: Not on file  Occupational History  . Occupation: oncology    Employer: [employer]  Tobacco Use  . Smoking status: Former Games developer  . Smokeless tobacco: Never Used  Vaping Use  . Vaping Use: Never used  Substance and Sexual Activity  . Alcohol use: Yes    Alcohol/week: 0.0 standard drinks    Comment: occass  . Drug use: No  Frequency: 5.0 times per week    Types: Marijuana    Comment: stopped when she found out she was pregnant  . Sexual activity: Not Currently    Partners: Male    Birth control/protection: None  Other Topics Concern  . Not on file  Social History Narrative  . Not on file   Social Determinants of Health   Financial Resource Strain:   . Difficulty of Paying Living Expenses:   Food Insecurity:   . Worried About Programme researcher, broadcasting/film/video in the Last Year:   . Barista in the Last Year:   Transportation Needs:   . Freight forwarder (Medical):   Marland Kitchen Lack of Transportation (Non-Medical):   Physical Activity:   . Days of Exercise per  Week:   . Minutes of Exercise per Session:   Stress:   . Feeling of Stress :   Social Connections:   . Frequency of Communication with Friends and Family:   . Frequency of Social Gatherings with Friends and Family:   . Attends Religious Services:   . Active Member of Clubs or Organizations:   . Attends Banker Meetings:   Marland Kitchen Marital Status:   Intimate Partner Violence:   . Fear of Current or Ex-Partner:   . Emotionally Abused:   Marland Kitchen Physically Abused:   . Sexually Abused:     Family History:  As above and: The patient's family history includes Diabetes in her maternal grandmother and paternal grandmother; Hyperlipidemia in her mother; Hypertension in her mother; Rashes / Skin problems in her son; Seizures in her maternal grandmother and son; Thyroid disease in her maternal grandmother.    ROS:  Unable to obtain:  Intubated and sedated.  Physical Exam/Data:   Vitals:   09/24/19 0026  Pulse: (!) 120  Resp: (!) 23  Temp: (!) 101.3 F (38.5 C)  TempSrc: Oral  Weight: 103.1 kg  Height: 5\' 10"  (1.778 m)   No intake or output data in the 24 hours ending 09/24/19 0052 Last 3 Weights 09/24/2019 09/23/2019 04/17/2018  Weight (lbs) 227 lb 4.7 oz 235 lb 214 lb 12.8 oz  Weight (kg) 103.1 kg 106.595 kg 97.433 kg  Some encounter information is confidential and restricted. Go to Review Flowsheets activity to see all data.     Body mass index is 32.61 kg/m.  General:  Intubated and sedated female HEENT: normal Lymph: no adenopathy Neck: no JVD while supine Endocrine:  No thryomegaly Cardiac:  normal S1, S2; regular tachycardia; no murmur  Lungs:  clear to auscultation bilaterally, mechanical breath sounds Abd: soft, nontender, no hepatomegaly; C section scare and abdominal surgery scar (from endometriosis surgery) Ext: no edema Skin: warm and dry   EKG:  sinus tachy with improvement of ST changes in series  Laboratory Data:  High Sensitivity Troponin:   Recent Labs    Lab 09/23/19 1539 09/23/19 1816  TROPONINIHS 127* 2,078*      Chemistry Recent Labs  Lab 09/23/19 1539 09/23/19 2240  NA 141 137  K 3.1* 3.1*  CL 105 103  CO2 24 21*  GLUCOSE 98 110*  BUN 6 8  CREATININE 0.52 0.70  CALCIUM 8.4* 7.8*  GFRNONAA >60 >60  GFRAA >60 >60  ANIONGAP 12 13    Hematology Recent Labs  Lab 09/23/19 1539 09/23/19 1844  WBC 9.0 20.3*  RBC 3.73* 3.14*  HGB 9.9* 8.4*  HCT 30.5* 26.7*  MCV 81.8 85.0  MCH 26.5 26.8  MCHC 32.5 31.5  RDW 16.3* 16.6*  PLT 569* 590*   BNP Recent Labs  Lab 09/23/19 1539  BNP 72.4     Radiology/Studies:  DG Chest 2 View  Result Date: 09/23/2019 CLINICAL DATA:  34 year old female with chest pain EXAM: CHEST - 2 VIEW COMPARISON:  None. FINDINGS: Small bilateral pleural effusions, right greater left. Minimal bibasilar atelectasis. Infiltrate is not excluded clinical correlation is recommended. No pneumothorax. There is mild cardiomegaly. No acute osseous pathology. IMPRESSION: 1. Small bilateral pleural effusions, right greater left. 2. Mild cardiomegaly. Electronically Signed   By: Elgie Collard M.D.   On: 09/23/2019 16:19   DG Abdomen 1 View  Result Date: 09/23/2019 CLINICAL DATA:  Intubated, enteric catheter placement EXAM: ABDOMEN - 1 VIEW COMPARISON:  None. FINDINGS: Two frontal views of the lower chest and upper abdomen demonstrates enteric catheter tip projecting over gastric body. Defibrillator pad overlies the cardiac apex. Bowel gas pattern is unremarkable. IMPRESSION: 1. Enteric catheter overlying gastric body. Electronically Signed   By: Sharlet Salina M.D.   On: 09/23/2019 18:43   CT Head Wo Contrast  Result Date: 09/23/2019 CLINICAL DATA:  Cardiac arrest EXAM: CT HEAD WITHOUT CONTRAST TECHNIQUE: Contiguous axial images were obtained from the base of the skull through the vertex without intravenous contrast. COMPARISON:  None. FINDINGS: Brain: There is no mass, hemorrhage or extra-axial collection. The  size and configuration of the ventricles and extra-axial CSF spaces are normal. The brain parenchyma is normal, without acute or chronic infarction. Punctate colloid cyst at the level of the foramina of [place] is incidentally noted. Vascular: No abnormal hyperdensity of the major intracranial arteries or dural venous sinuses. No intracranial atherosclerosis. Skull: The visualized skull base, calvarium and extracranial soft tissues are normal. Sinuses/Orbits: No fluid levels or advanced mucosal thickening of the visualized paranasal sinuses. No mastoid or middle ear effusion. The orbits are normal. IMPRESSION: Normal head CT. Electronically Signed   By: Deatra Robinson M.D.   On: 09/23/2019 20:43   CT Angio Chest PE W/Cm &/Or Wo Cm  Result Date: 09/23/2019 CLINICAL DATA:  Chest pain. COVID positive. EXAM: CT ANGIOGRAPHY CHEST WITH CONTRAST TECHNIQUE: Multidetector CT imaging of the chest was performed using the standard protocol during bolus administration of intravenous contrast. Multiplanar CT image reconstructions and MIPs were obtained to evaluate the vascular anatomy. CONTRAST:  OMNIPAQUE IOHEXOL 350 MG/ML SOLN COMPARISON:  None. FINDINGS: Cardiovascular: Satisfactory opacification of the pulmonary arteries to the segmental level. No evidence of pulmonary embolism. Normal heart size. A very small pericardial effusion is present. Mediastinum/Nodes: No enlarged mediastinal, hilar, or axillary lymph nodes. Endotracheal and nasogastric tubes are in place. Thyroid gland, trachea, and esophagus demonstrate no significant findings. Lungs/Pleura: Moderate severity infiltrates and/or atelectasis are seen within the bilateral upper lobes, right greater than left, and posterior aspects of the bilateral lower lobes, left greater than right. Small bilateral pleural effusions are seen. No pneumothorax is identified. Upper Abdomen: A mild amount of fluid is suspected within the region in between the right kidney and  liver. Musculoskeletal: No chest wall abnormality. No acute or significant osseous findings. Review of the MIP images confirms the above findings. IMPRESSION: 1. No CT evidence of acute pulmonary embolism. 2. Moderate severity infiltrates and/or atelectasis along the bilateral upper lobes, right greater than left, and posterior aspects of the bilateral lower lobes, left greater than right. 3. Small bilateral pleural effusions. 4. Very small pericardial effusion. 5. Mild amount of fluid suspected within the region in between the right kidney and  liver. Electronically Signed   By: Aram Candelahaddeus  Houston M.D.   On: 09/23/2019 20:43   CT Abdomen Pelvis W Contrast  Result Date: 09/23/2019 CLINICAL DATA:  Abdominal pain. EXAM: CT ABDOMEN AND PELVIS WITH CONTRAST TECHNIQUE: Multidetector CT imaging of the abdomen and pelvis was performed using the standard protocol following bolus administration of intravenous contrast. CONTRAST:  100mL OMNIPAQUE IOHEXOL 350 MG/ML SOLN COMPARISON:  None. FINDINGS: Lower chest: Moderate severity areas of atelectasis and/or infiltrate are seen within the posterior aspect of the bilateral lung bases, left greater than right. Small bilateral pleural effusions are seen. A small pericardial effusion is noted. Hepatobiliary: No focal liver abnormality is seen. No gallstones, gallbladder wall thickening, or biliary dilatation. Pancreas: Unremarkable. No pancreatic ductal dilatation or surrounding inflammatory changes. Spleen: Normal in size without focal abnormality. Adrenals/Urinary Tract: Adrenal glands are unremarkable. Kidneys are normal, without renal calculi, focal lesion, or hydronephrosis. A Foley catheter is seen within the lumen of the urinary bladder. Stomach/Bowel: A nasogastric tube is seen with its distal tip noted within the body of the stomach. Stomach is within normal limits. Appendix appears normal. No evidence of bowel dilatation. Vascular/Lymphatic: No significant vascular  findings are present. No enlarged abdominal or pelvic lymph nodes. Reproductive: The uterus is enlarged and heterogeneous in appearance. Other: A C-section scar seen along the midline of the anterior pelvic wall. A small amount of fluid is seen in between the right kidney and right lobe of the liver, as well as within the posterior aspect of the pelvis. Musculoskeletal: No acute or significant osseous findings. IMPRESSION: 1. Moderate severity areas of atelectasis and/or infiltrate within the posterior aspect of the bilateral lung bases, left greater than right, with small bilateral pleural effusions. 2. Small pericardial effusion. 3. Enlarged, heterogeneous uterus, likely representing a fibroid uterus. 4. Small amount of fluid seen in between the right kidney and right lobe of the liver, as well as within the posterior aspect of the pelvis. Electronically Signed   By: Aram Candelahaddeus  Houston M.D.   On: 09/23/2019 20:48   CARDIAC CATHETERIZATION  Result Date: 09/23/2019 Conclusions: 1. No coronary artery occlusion or critical stenosis. 2. There is aneurysmal dilation of the ostial and proximal LAD with possible ulceration or dissection of uncertain chronicity.  TIMI-3 flow is noted throughout the LAD and its branches. 3. No angiographically significant disease involving the LCx or RCA. 4. Severely reduced left ventricular systolic function with mid/apical anterior, apical, and apical inferior hypokinesis/akinesis. 5. Severely elevated left ventricular filling pressure. Recommendations: 1. Continue indefinite aspirin 81 mg daily.  Defer P2Y12 inhibitor given recent acute blood loss anemia in the setting of c-section. 2. Defer IV heparin given concern for possible dissection involving aneurysmal segment of proximal LAD. 3. [facility] and surgical consultation/intervention if the patient were to become unstable with evidence of compromised flow in the LAD. 4. Continue amiodarone  infusion. 5. Continue diuresis; patient received furosemide 40 mg IV x 1 at the end of catheterization. 6. Add evidence based medical therapy for heart failure as blood pressure tolerates and the patient is better compensated. Yvonne Kendallhristopher End, MD [facility]   DG Chest Portable 1 View  Result Date: 09/23/2019 CLINICAL DATA:  Chest pain, intubated EXAM: PORTABLE CHEST 1 VIEW COMPARISON:  09/23/2019 FINDINGS: Single frontal view of the chest demonstrates endotracheal tube overlying tracheal air column tip 1.6 cm above carina. Enteric catheter passes below the diaphragm tip excluded by collimation. External defibrillator pads overlie the cardiac apex. The  cardiac silhouette is enlarged but stable. Progressive central vascular congestion with small bilateral pleural effusions. No pneumothorax. IMPRESSION: 1. Support devices as above. 2. Progressive central vascular congestion with trace bilateral pleural effusion. Electronically Signed   By: Sharlet Salina M.D.   On: 09/23/2019 18:42    Assessment and Plan:   Coronary Artery Aneursm Possible Coronary Artery Dissection New Cardiomyopathy Polymorphic VT/VF- Cardiac arrest Recent Prior C Section Critically ill patient - will continue ASA and [place] Heparin - will continue Amiodarone drip and propofol; no plans for extubation overnight/early AM - if decompensates will discussed with interventional team/surgery for possible emergency surgery - will plan for AM lasix repeat dosing (elevated LVEDP on LCP) - AM Early Echo   - Discussed with critical care team: -- Discuss the merits of cooling given 6 hours without and relatively short, inpatient, arrest; will defer at this time -- vent management -- critical care support; HD stable, will defer central access at this time  Severity of Illness: The appropriate patient status for this patient is INPATIENT. Inpatient status is judged to be reasonable and necessary in order to provide the required intensity  of service to ensure the patient's safety. The patient's presenting symptoms, physical exam findings, and initial radiographic and laboratory data in the context of their chronic comorbidities is felt to place them at high risk for further clinical deterioration. Furthermore, it is not anticipated that the patient will be medically stable for discharge from the hospital within 2 midnights of admission. The following factors support the patient status of inpatient.   " The patient's presenting symptoms include cardiac arrest/chest pain. " The worrisome physical exam findings include VT Arrest. " The initial radiographic and laboratory data are worrisome because of NSTEMI; coronary artery aneurysm. " The chronic co-morbidities include recent surgery.   * I certify that at the point of admission it is my clinical judgment that the patient will require inpatient hospital care spanning beyond 2 midnights from the point of admission due to high intensity of service, high risk for further deterioration and high frequency of surveillance required.*    For questions or updates, please contact [facility] Please consult [email or web address] for contact info under     Signed, Christell Constant, MD  09/24/2019 12:52 AM    Discussed with 2H team, critical care team, and family.  hPOA [facility] [phone].  Riley Lam MD

## 2019-09-23 NOTE — ED Notes (Signed)
Trop 127, charge RN notified.

## 2019-09-23 NOTE — ED Triage Notes (Addendum)
First nurse note- here for CP. 2 weeks PP with HTN per EMS.  NAD at this time.  Denies swelling. Pulled for EKG

## 2019-09-23 NOTE — Code Documentation (Signed)
This RN found patient pulseless at bedside at this time. This RN started compressions and called for help from charge RN. Doctor Larinda Buttery at bedside with additional staff coming in.

## 2019-09-23 NOTE — ED Notes (Signed)
Dr. End at bedside. 

## 2019-09-23 NOTE — Code Documentation (Signed)
amio 150 mg ordered by [place]. Pulses back at this time. Amiodarone 150 mg bolus given at this time.

## 2019-09-24 ENCOUNTER — Inpatient Hospital Stay (HOSPITAL_COMMUNITY): Payer: Medicaid Other

## 2019-09-24 ENCOUNTER — Encounter: Payer: Self-pay | Admitting: Internal Medicine

## 2019-09-24 ENCOUNTER — Inpatient Hospital Stay (HOSPITAL_COMMUNITY)
Admission: EM | Admit: 2019-09-24 | Discharge: 2019-11-28 | DRG: 004 | Disposition: A | Discharge status: Home Health | Payer: Medicaid Other | Source: Other Acute Inpatient Hospital | Attending: Student | Admitting: Student

## 2019-09-24 DIAGNOSIS — Z87891 Personal history of nicotine dependence: Secondary | ICD-10-CM

## 2019-09-24 DIAGNOSIS — J9601 Acute respiratory failure with hypoxia: Secondary | ICD-10-CM | POA: Diagnosis present

## 2019-09-24 DIAGNOSIS — I2102 ST elevation (STEMI) myocardial infarction involving left anterior descending coronary artery: Principal | ICD-10-CM | POA: Diagnosis present

## 2019-09-24 DIAGNOSIS — I469 Cardiac arrest, cause unspecified: Secondary | ICD-10-CM

## 2019-09-24 DIAGNOSIS — I4729 Other ventricular tachycardia: Secondary | ICD-10-CM

## 2019-09-24 DIAGNOSIS — E876 Hypokalemia: Secondary | ICD-10-CM | POA: Diagnosis present

## 2019-09-24 DIAGNOSIS — I472 Ventricular tachycardia: Secondary | ICD-10-CM

## 2019-09-24 DIAGNOSIS — D649 Anemia, unspecified: Secondary | ICD-10-CM | POA: Diagnosis present

## 2019-09-24 DIAGNOSIS — G931 Anoxic brain damage, not elsewhere classified: Secondary | ICD-10-CM | POA: Diagnosis not present

## 2019-09-24 DIAGNOSIS — R197 Diarrhea, unspecified: Secondary | ICD-10-CM | POA: Diagnosis not present

## 2019-09-24 DIAGNOSIS — D75839 Thrombocytosis, unspecified: Secondary | ICD-10-CM | POA: Diagnosis not present

## 2019-09-24 DIAGNOSIS — I471 Supraventricular tachycardia: Secondary | ICD-10-CM | POA: Diagnosis present

## 2019-09-24 DIAGNOSIS — J9690 Respiratory failure, unspecified, unspecified whether with hypoxia or hypercapnia: Secondary | ICD-10-CM | POA: Diagnosis present

## 2019-09-24 DIAGNOSIS — R1310 Dysphagia, unspecified: Secondary | ICD-10-CM

## 2019-09-24 DIAGNOSIS — R34 Anuria and oliguria: Secondary | ICD-10-CM | POA: Diagnosis not present

## 2019-09-24 DIAGNOSIS — I2542 Coronary artery dissection: Secondary | ICD-10-CM | POA: Diagnosis present

## 2019-09-24 DIAGNOSIS — R7989 Other specified abnormal findings of blood chemistry: Secondary | ICD-10-CM | POA: Diagnosis present

## 2019-09-24 DIAGNOSIS — E669 Obesity, unspecified: Secondary | ICD-10-CM | POA: Diagnosis present

## 2019-09-24 DIAGNOSIS — J156 Pneumonia due to other aerobic Gram-negative bacteria: Secondary | ICD-10-CM | POA: Diagnosis present

## 2019-09-24 DIAGNOSIS — I255 Ischemic cardiomyopathy: Secondary | ICD-10-CM | POA: Diagnosis present

## 2019-09-24 DIAGNOSIS — R7303 Prediabetes: Secondary | ICD-10-CM

## 2019-09-24 DIAGNOSIS — Z98891 History of uterine scar from previous surgery: Secondary | ICD-10-CM

## 2019-09-24 DIAGNOSIS — J9611 Chronic respiratory failure with hypoxia: Secondary | ICD-10-CM | POA: Diagnosis not present

## 2019-09-24 DIAGNOSIS — O903 Peripartum cardiomyopathy: Secondary | ICD-10-CM | POA: Diagnosis present

## 2019-09-24 DIAGNOSIS — G9389 Other specified disorders of brain: Secondary | ICD-10-CM | POA: Diagnosis present

## 2019-09-24 DIAGNOSIS — I5041 Acute combined systolic (congestive) and diastolic (congestive) heart failure: Secondary | ICD-10-CM | POA: Diagnosis not present

## 2019-09-24 DIAGNOSIS — Z9911 Dependence on respirator [ventilator] status: Secondary | ICD-10-CM

## 2019-09-24 DIAGNOSIS — O9089 Other complications of the puerperium, not elsewhere classified: Secondary | ICD-10-CM | POA: Diagnosis present

## 2019-09-24 DIAGNOSIS — F419 Anxiety disorder, unspecified: Secondary | ICD-10-CM | POA: Diagnosis present

## 2019-09-24 DIAGNOSIS — G9341 Metabolic encephalopathy: Secondary | ICD-10-CM | POA: Diagnosis present

## 2019-09-24 DIAGNOSIS — J168 Pneumonia due to other specified infectious organisms: Secondary | ICD-10-CM | POA: Diagnosis not present

## 2019-09-24 DIAGNOSIS — R413 Other amnesia: Secondary | ICD-10-CM | POA: Diagnosis present

## 2019-09-24 DIAGNOSIS — I2541 Coronary artery aneurysm: Secondary | ICD-10-CM | POA: Diagnosis present

## 2019-09-24 DIAGNOSIS — E11649 Type 2 diabetes mellitus with hypoglycemia without coma: Secondary | ICD-10-CM | POA: Diagnosis present

## 2019-09-24 DIAGNOSIS — G4700 Insomnia, unspecified: Secondary | ICD-10-CM | POA: Diagnosis present

## 2019-09-24 DIAGNOSIS — R5381 Other malaise: Secondary | ICD-10-CM | POA: Diagnosis not present

## 2019-09-24 DIAGNOSIS — I462 Cardiac arrest due to underlying cardiac condition: Secondary | ICD-10-CM | POA: Diagnosis present

## 2019-09-24 DIAGNOSIS — Z833 Family history of diabetes mellitus: Secondary | ICD-10-CM

## 2019-09-24 DIAGNOSIS — J15211 Pneumonia due to Methicillin susceptible Staphylococcus aureus: Secondary | ICD-10-CM | POA: Diagnosis present

## 2019-09-24 DIAGNOSIS — Z930 Tracheostomy status: Secondary | ICD-10-CM

## 2019-09-24 DIAGNOSIS — Z6833 Body mass index (BMI) 33.0-33.9, adult: Secondary | ICD-10-CM

## 2019-09-24 DIAGNOSIS — I110 Hypertensive heart disease with heart failure: Secondary | ICD-10-CM | POA: Diagnosis present

## 2019-09-24 DIAGNOSIS — Z452 Encounter for adjustment and management of vascular access device: Secondary | ICD-10-CM | POA: Diagnosis not present

## 2019-09-24 DIAGNOSIS — R569 Unspecified convulsions: Secondary | ICD-10-CM | POA: Diagnosis present

## 2019-09-24 DIAGNOSIS — R410 Disorientation, unspecified: Secondary | ICD-10-CM | POA: Diagnosis not present

## 2019-09-24 DIAGNOSIS — Z4659 Encounter for fitting and adjustment of other gastrointestinal appliance and device: Secondary | ICD-10-CM

## 2019-09-24 DIAGNOSIS — Z95828 Presence of other vascular implants and grafts: Secondary | ICD-10-CM

## 2019-09-24 DIAGNOSIS — E1165 Type 2 diabetes mellitus with hyperglycemia: Secondary | ICD-10-CM | POA: Diagnosis not present

## 2019-09-24 DIAGNOSIS — R1312 Dysphagia, oropharyngeal phase: Secondary | ICD-10-CM | POA: Diagnosis present

## 2019-09-24 DIAGNOSIS — R0789 Other chest pain: Secondary | ICD-10-CM | POA: Diagnosis present

## 2019-09-24 DIAGNOSIS — I4901 Ventricular fibrillation: Secondary | ICD-10-CM | POA: Diagnosis not present

## 2019-09-24 DIAGNOSIS — I2510 Atherosclerotic heart disease of native coronary artery without angina pectoris: Secondary | ICD-10-CM | POA: Diagnosis present

## 2019-09-24 DIAGNOSIS — Z8249 Family history of ischemic heart disease and other diseases of the circulatory system: Secondary | ICD-10-CM

## 2019-09-24 LAB — CBC
HCT: 32 % — ABNORMAL LOW (ref 36.0–46.0)
HCT: 32 % — ABNORMAL LOW (ref 36.0–46.0)
Hemoglobin: 9.8 g/dL — ABNORMAL LOW (ref 12.0–15.0)
Hemoglobin: 9.7 g/dL — ABNORMAL LOW (ref 12.0–15.0)
MCH: 25.4 pg — ABNORMAL LOW (ref 26.0–34.0)
MCH: 25.5 pg — ABNORMAL LOW (ref 26.0–34.0)
MCHC: 30.6 g/dL (ref 30.0–36.0)
MCHC: 30.3 g/dL (ref 30.0–36.0)
MCV: 82.9 fL (ref 80.0–100.0)
MCV: 84.2 fL (ref 80.0–100.0)
Platelets: 672 10*3/uL — ABNORMAL HIGH (ref 150–400)
Platelets: 757 10*3/uL — ABNORMAL HIGH (ref 150–400)
RBC: 3.86 MIL/uL — ABNORMAL LOW (ref 3.87–5.11)
RBC: 3.8 MIL/uL — ABNORMAL LOW (ref 3.87–5.11)
RDW: 16.5 % — ABNORMAL HIGH (ref 11.5–15.5)
RDW: 16.8 % — ABNORMAL HIGH (ref 11.5–15.5)
WBC: 11.6 10*3/uL — ABNORMAL HIGH (ref 4.0–10.5)
WBC: 19 10*3/uL — ABNORMAL HIGH (ref 4.0–10.5)
nRBC: 0 % (ref 0.0–0.2)
nRBC: 0 % (ref 0.0–0.2)

## 2019-09-24 LAB — ECHOCARDIOGRAM COMPLETE
Height: 70 in
S' Lateral: 3.7 cm
Single Plane A4C EF: 34.6 %
Weight: 3636.71 oz

## 2019-09-24 LAB — BASIC METABOLIC PANEL
Anion gap: 14 (ref 5–15)
Anion gap: 15 (ref 5–15)
Anion gap: 14 (ref 5–15)
BUN: 8 mg/dL (ref 6–20)
BUN: 7 mg/dL (ref 6–20)
BUN: 8 mg/dL (ref 6–20)
CO2: 21 mmol/L — ABNORMAL LOW (ref 22–32)
CO2: 21 mmol/L — ABNORMAL LOW (ref 22–32)
CO2: 22 mmol/L (ref 22–32)
Calcium: 8 mg/dL — ABNORMAL LOW (ref 8.9–10.3)
Calcium: 8.5 mg/dL — ABNORMAL LOW (ref 8.9–10.3)
Calcium: 8.8 mg/dL — ABNORMAL LOW (ref 8.9–10.3)
Chloride: 102 mmol/L (ref 98–111)
Chloride: 102 mmol/L (ref 98–111)
Chloride: 102 mmol/L (ref 98–111)
Creatinine, Ser: 0.94 mg/dL (ref 0.44–1.00)
Creatinine, Ser: 0.75 mg/dL (ref 0.44–1.00)
Creatinine, Ser: 0.74 mg/dL (ref 0.44–1.00)
GFR calc Af Amer: >60 mL/min (ref >60)
GFR calc Af Amer: >60 mL/min (ref >60)
GFR calc Af Amer: >60 mL/min (ref >60)
GFR calc non Af Amer: >60 mL/min (ref >60)
GFR calc non Af Amer: >60 mL/min (ref >60)
GFR calc non Af Amer: >60 mL/min (ref >60)
Glucose, Bld: 114 mg/dL — ABNORMAL HIGH (ref 70–99)
Glucose, Bld: 125 mg/dL — ABNORMAL HIGH (ref 70–99)
Glucose, Bld: 90 mg/dL (ref 70–99)
Potassium: 2.7 mmol/L — CL (ref 3.5–5.1)
Potassium: 4.1 mmol/L (ref 3.5–5.1)
Potassium: 3.7 mmol/L (ref 3.5–5.1)
Sodium: 137 mmol/L (ref 135–145)
Sodium: 138 mmol/L (ref 135–145)
Sodium: 138 mmol/L (ref 135–145)

## 2019-09-24 LAB — POCT I-STAT 7, (LYTES, BLD GAS, ICA,H+H)
Acid-Base Excess: 0 mmol/L (ref 0.0–2.0)
Acid-base deficit: 1 mmol/L (ref 0.0–2.0)
Bicarbonate: 24 mmol/L (ref 20.0–28.0)
Bicarbonate: 22.4 mmol/L (ref 20.0–28.0)
Calcium, Ion: 1.1 mmol/L — ABNORMAL LOW (ref 1.15–1.40)
Calcium, Ion: 1.08 mmol/L — ABNORMAL LOW (ref 1.15–1.40)
HCT: 31 % — ABNORMAL LOW (ref 36.0–46.0)
HCT: 30 % — ABNORMAL LOW (ref 36.0–46.0)
Hemoglobin: 10.5 g/dL — ABNORMAL LOW (ref 12.0–15.0)
Hemoglobin: 10.2 g/dL — ABNORMAL LOW (ref 12.0–15.0)
O2 Saturation: 95 %
O2 Saturation: 99 %
Patient temperature: 38.3
Patient temperature: 38
Potassium: 3 mmol/L — ABNORMAL LOW (ref 3.5–5.1)
Potassium: 3.3 mmol/L — ABNORMAL LOW (ref 3.5–5.1)
Sodium: 137 mmol/L (ref 135–145)
Sodium: 138 mmol/L (ref 135–145)
TCO2: 25 mmol/L (ref 22–32)
TCO2: 23 mmol/L (ref 22–32)
pCO2 arterial: 39.7 mmHg (ref 32.0–48.0)
pCO2 arterial: 33.5 mmHg (ref 32.0–48.0)
pH, Arterial: 7.396 (ref 7.350–7.450)
pH, Arterial: 7.436 (ref 7.350–7.450)
pO2, Arterial: 80 mmHg — ABNORMAL LOW (ref 83.0–108.0)
pO2, Arterial: 157 mmHg — ABNORMAL HIGH (ref 83.0–108.0)

## 2019-09-24 LAB — GLUCOSE, CAPILLARY
Glucose-Capillary: 103 mg/dL — ABNORMAL HIGH (ref 70–99)
Glucose-Capillary: 114 mg/dL — ABNORMAL HIGH (ref 70–99)
Glucose-Capillary: 117 mg/dL — ABNORMAL HIGH (ref 70–99)
Glucose-Capillary: 79 mg/dL (ref 70–99)
Glucose-Capillary: 81 mg/dL (ref 70–99)
Glucose-Capillary: 81 mg/dL (ref 70–99)
Glucose-Capillary: 96 mg/dL (ref 70–99)

## 2019-09-24 LAB — TROPONIN I (HIGH SENSITIVITY)
Troponin I (High Sensitivity): 24421 ng/L (ref <18)
Troponin I (High Sensitivity): >27000 ng/L (ref <18)

## 2019-09-24 LAB — COMPREHENSIVE METABOLIC PANEL
ALT: 69 U/L — ABNORMAL HIGH (ref 0–44)
AST: 200 U/L — ABNORMAL HIGH (ref 15–41)
Albumin: 2.7 g/dL — ABNORMAL LOW (ref 3.5–5.0)
Alkaline Phosphatase: 99 U/L (ref 38–126)
Anion gap: 15 (ref 5–15)
BUN: 8 mg/dL (ref 6–20)
CO2: 21 mmol/L — ABNORMAL LOW (ref 22–32)
Calcium: 8 mg/dL — ABNORMAL LOW (ref 8.9–10.3)
Chloride: 101 mmol/L (ref 98–111)
Creatinine, Ser: 0.88 mg/dL (ref 0.44–1.00)
GFR calc Af Amer: >60 mL/min (ref >60)
GFR calc non Af Amer: >60 mL/min (ref >60)
Glucose, Bld: 115 mg/dL — ABNORMAL HIGH (ref 70–99)
Potassium: 2.7 mmol/L — CL (ref 3.5–5.1)
Sodium: 137 mmol/L (ref 135–145)
Total Bilirubin: 0.6 mg/dL (ref 0.3–1.2)
Total Protein: 6.9 g/dL (ref 6.5–8.1)

## 2019-09-24 LAB — HEMOGLOBIN A1C
Hgb A1c MFr Bld: 5.7 % — ABNORMAL HIGH (ref 4.8–5.6)
Mean Plasma Glucose: 116.89 mg/dL

## 2019-09-24 LAB — PROTIME-INR
INR: 1.2 (ref 0.8–1.2)
Prothrombin Time: 14.7 seconds (ref 11.4–15.2)

## 2019-09-24 LAB — TSH: TSH: 0.511 u[IU]/mL (ref 0.350–4.500)

## 2019-09-24 LAB — MAGNESIUM
Magnesium: 2 mg/dL (ref 1.7–2.4)
Magnesium: 2 mg/dL (ref 1.7–2.4)

## 2019-09-24 LAB — MRSA PCR SCREENING: MRSA by PCR: NEGATIVE

## 2019-09-24 LAB — TRIGLYCERIDES: Triglycerides: 102 mg/dL (ref <150)

## 2019-09-24 LAB — PHOSPHORUS: Phosphorus: 4.5 mg/dL (ref 2.5–4.6)

## 2019-09-24 LAB — BRAIN NATRIURETIC PEPTIDE: B Natriuretic Peptide: 250.4 pg/mL — ABNORMAL HIGH (ref 0.0–100.0)

## 2019-09-24 LAB — APTT: aPTT: 32 seconds (ref 24–36)

## 2019-09-24 MED ORDER — FENTANYL CITRATE (PF) 100 MCG/2ML IJ SOLN
50.0000 ug | INTRAMUSCULAR | Status: DC | PRN
  Administered 2019-09-24 – 2019-10-04 (×19): 100 ug via INTRAVENOUS
  Filled 2019-09-24 (×3): qty 2
  Filled 2019-09-24: qty 4
  Filled 2019-09-24: qty 2
  Filled 2019-09-24: qty 4
  Filled 2019-09-24: qty 2
  Filled 2019-09-24 (×3): qty 4
  Filled 2019-09-24 (×4): qty 2
  Filled 2019-09-24: qty 4
  Filled 2019-09-24: qty 2

## 2019-09-24 MED ORDER — ACETAMINOPHEN 325 MG PO TABS
650.0000 mg | ORAL_TABLET | ORAL | Status: DC | PRN
  Administered 2019-09-24 – 2019-09-26 (×5): 650 mg
  Filled 2019-09-24 (×7): qty 2

## 2019-09-24 MED ORDER — POTASSIUM CHLORIDE 20 MEQ/15ML (10%) PO SOLN
40.0000 meq | Freq: Two times a day (BID) | ORAL | Status: AC
  Administered 2019-09-24 (×2): 40 meq
  Filled 2019-09-24 (×2): qty 30

## 2019-09-24 MED ORDER — CHLORHEXIDINE GLUCONATE CLOTH 2 % EX PADS
6.0000 | MEDICATED_PAD | Freq: Every day | CUTANEOUS | Status: DC
  Administered 2019-09-24 – 2019-11-05 (×42): 6 via TOPICAL

## 2019-09-24 MED ORDER — DOCUSATE SODIUM 50 MG/5ML PO LIQD
100.0000 mg | Freq: Two times a day (BID) | ORAL | Status: DC
  Administered 2019-09-24 – 2019-09-29 (×5): 100 mg via ORAL
  Filled 2019-09-24 (×5): qty 10

## 2019-09-24 MED ORDER — SODIUM CHLORIDE 0.9 % IV SOLN: INTRAVENOUS | Status: DC

## 2019-09-24 MED ORDER — ORAL CARE MOUTH RINSE
15.0000 mL | OROMUCOSAL | Status: DC
  Administered 2019-09-24 – 2019-11-01 (×304): 15 mL via OROMUCOSAL

## 2019-09-24 MED ORDER — PANTOPRAZOLE SODIUM 40 MG IV SOLR
40.0000 mg | INTRAVENOUS | Status: DC
  Administered 2019-09-24 – 2019-10-11 (×18): 40 mg via INTRAVENOUS
  Filled 2019-09-24 (×19): qty 40

## 2019-09-24 MED ORDER — INSULIN ASPART 100 UNIT/ML ~~LOC~~ SOLN
0.0000 [IU] | SUBCUTANEOUS | Status: DC
  Administered 2019-09-27 – 2019-09-30 (×5): 2 [IU] via SUBCUTANEOUS
  Administered 2019-09-30: 3 [IU] via SUBCUTANEOUS
  Administered 2019-09-30 – 2019-10-31 (×44): 2 [IU] via SUBCUTANEOUS
  Administered 2019-11-01: 3 [IU] via SUBCUTANEOUS
  Administered 2019-11-02 – 2019-11-05 (×7): 2 [IU] via SUBCUTANEOUS
  Administered 2019-11-05: 3 [IU] via SUBCUTANEOUS
  Administered 2019-11-06 – 2019-11-09 (×3): 2 [IU] via SUBCUTANEOUS
  Administered 2019-11-09 – 2019-11-11 (×3): 3 [IU] via SUBCUTANEOUS
  Administered 2019-11-11 – 2019-11-12 (×4): 2 [IU] via SUBCUTANEOUS
  Administered 2019-11-12: 3 [IU] via SUBCUTANEOUS
  Administered 2019-11-12 – 2019-11-26 (×14): 2 [IU] via SUBCUTANEOUS

## 2019-09-24 MED ORDER — FUROSEMIDE 10 MG/ML IJ SOLN
40.0000 mg | Freq: Once | INTRAMUSCULAR | Status: AC
  Administered 2019-09-24: 40 mg via INTRAVENOUS
  Filled 2019-09-24: qty 4

## 2019-09-24 MED ORDER — AMIODARONE HCL IN DEXTROSE 360-4.14 MG/200ML-% IV SOLN
60.0000 mg/h | INTRAVENOUS | Status: DC
  Filled 2019-09-24: qty 400

## 2019-09-24 MED ORDER — CHLORHEXIDINE GLUCONATE 0.12% ORAL RINSE (MEDLINE KIT)
15.0000 mL | Freq: Two times a day (BID) | OROMUCOSAL | Status: DC
  Administered 2019-09-24 – 2019-11-05 (×82): 15 mL via OROMUCOSAL

## 2019-09-24 MED ORDER — POTASSIUM CHLORIDE 10 MEQ/100ML IV SOLN
10.0000 meq | INTRAVENOUS | Status: DC
  Filled 2019-09-24: qty 100

## 2019-09-24 MED ORDER — AMIODARONE HCL IN DEXTROSE 360-4.14 MG/200ML-% IV SOLN: 60.0000 mg/h | INTRAVENOUS | Status: DC

## 2019-09-24 MED ORDER — POTASSIUM CHLORIDE 10 MEQ/100ML IV SOLN
10.0000 meq | INTRAVENOUS | Status: AC
  Administered 2019-09-24 (×4): 10 meq via INTRAVENOUS
  Filled 2019-09-24 (×4): qty 100

## 2019-09-24 MED ORDER — PROPOFOL 1000 MG/100ML IV EMUL
0.0000 ug/kg/min | INTRAVENOUS | Status: DC
  Administered 2019-09-24: 40 ug/kg/min via INTRAVENOUS
  Administered 2019-09-24: 25 ug/kg/min via INTRAVENOUS
  Administered 2019-09-24: 40 ug/kg/min via INTRAVENOUS
  Administered 2019-09-24: 30 ug/kg/min via INTRAVENOUS
  Administered 2019-09-24: 40 ug/kg/min via INTRAVENOUS
  Administered 2019-09-24: 35 ug/kg/min via INTRAVENOUS
  Administered 2019-09-25 – 2019-09-26 (×8): 45 ug/kg/min via INTRAVENOUS
  Administered 2019-09-26: 25 ug/kg/min via INTRAVENOUS
  Filled 2019-09-24: qty 200
  Filled 2019-09-24: qty 100
  Filled 2019-09-24: qty 200
  Filled 2019-09-24 (×2): qty 100
  Filled 2019-09-24: qty 200
  Filled 2019-09-24 (×7): qty 100

## 2019-09-24 MED ORDER — LORAZEPAM 2 MG/ML IJ SOLN: 2.0000 mg | INTRAMUSCULAR | Status: DC | PRN

## 2019-09-24 MED ORDER — POTASSIUM CHLORIDE 20 MEQ/15ML (10%) PO SOLN
40.0000 meq | Freq: Once | ORAL | Status: AC
  Administered 2019-09-24: 40 meq
  Filled 2019-09-24: qty 30

## 2019-09-24 MED ORDER — FENTANYL CITRATE (PF) 100 MCG/2ML IJ SOLN
50.0000 ug | INTRAMUSCULAR | Status: AC | PRN
  Administered 2019-09-24 – 2019-09-25 (×3): 50 ug via INTRAVENOUS
  Filled 2019-09-24: qty 2

## 2019-09-24 MED ORDER — AMIODARONE HCL IN DEXTROSE 360-4.14 MG/200ML-% IV SOLN: 30.0000 mg/h | INTRAVENOUS | Status: DC

## 2019-09-24 MED ORDER — SODIUM CHLORIDE 0.9 % IV SOLN
2.0000 g | Freq: Every day | INTRAVENOUS | Status: AC
  Administered 2019-09-24 – 2019-09-28 (×5): 2 g via INTRAVENOUS
  Filled 2019-09-24: qty 20
  Filled 2019-09-24: qty 2
  Filled 2019-09-24: qty 20
  Filled 2019-09-24 (×2): qty 2

## 2019-09-24 MED ORDER — SODIUM CHLORIDE 0.9% FLUSH
10.0000 mL | Freq: Two times a day (BID) | INTRAVENOUS | Status: DC
  Administered 2019-09-24 – 2019-10-03 (×15): 10 mL
  Administered 2019-10-05: 30 mL
  Administered 2019-10-06 – 2019-10-07 (×4): 10 mL

## 2019-09-24 MED ORDER — POLYETHYLENE GLYCOL 3350 17 G PO PACK
17.0000 g | PACK | Freq: Every day | ORAL | Status: DC
  Administered 2019-09-25 – 2019-09-27 (×3): 17 g via ORAL
  Filled 2019-09-24 (×3): qty 1

## 2019-09-24 MED ORDER — LORAZEPAM 2 MG/ML IJ SOLN
INTRAMUSCULAR | Status: AC
  Administered 2019-09-24: 2 mg via INTRAVENOUS
  Filled 2019-09-24: qty 1

## 2019-09-24 MED ORDER — ACETAMINOPHEN 325 MG PO TABS
650.0000 mg | ORAL_TABLET | ORAL | Status: DC | PRN
  Filled 2019-09-24: qty 2

## 2019-09-24 MED ORDER — SODIUM CHLORIDE 0.9 % IV SOLN
500.0000 mg | Freq: Every day | INTRAVENOUS | Status: DC
  Administered 2019-09-24: 500 mg via INTRAVENOUS
  Filled 2019-09-24 (×2): qty 500

## 2019-09-24 MED ORDER — LEVETIRACETAM IN NACL 1000 MG/100ML IV SOLN
1000.0000 mg | Freq: Two times a day (BID) | INTRAVENOUS | Status: DC
  Administered 2019-09-24 (×3): 1000 mg via INTRAVENOUS
  Filled 2019-09-24 (×3): qty 100

## 2019-09-24 MED ORDER — ASPIRIN EC 81 MG PO TBEC: 81.0000 mg | DELAYED_RELEASE_TABLET | Freq: Every day | ORAL | Status: DC

## 2019-09-24 MED ORDER — LORAZEPAM 2 MG/ML IJ SOLN: 2.0000 mg | Freq: Once | INTRAMUSCULAR | Status: AC

## 2019-09-24 MED ORDER — SODIUM CHLORIDE 0.9% FLUSH: 10.0000 mL | INTRAVENOUS | Status: DC | PRN

## 2019-09-24 MED ORDER — SODIUM CHLORIDE 0.9 % IV SOLN
1.0000 g | Freq: Every day | INTRAVENOUS | Status: DC
  Administered 2019-09-24: 1 g via INTRAVENOUS
  Filled 2019-09-24 (×2): qty 10

## 2019-09-24 NOTE — CV Procedure (Signed)
Echo attempted 2 times today patient havinf other procedures done. Will try again after dialysis.

## 2019-09-24 NOTE — Progress Notes (Signed)
eLink Physician-Brief Progress Note Patient Name: Beverly [name] DOB: 1985/07/12 MRN: [id number]   Date of Service  09/24/2019  HPI/Events of Note  Request to review CXR for ETT position and Abdominal film for gastric tube position. ETT tip 2 cm above carina and gastric tube tip in mid stomach   eICU Interventions  ETT and gastric tube positions are satisfactory.      Intervention Category Major Interventions: Other:  Lenell Antu 09/24/2019, 1:36 AM

## 2019-09-24 NOTE — Progress Notes (Signed)
LTM started; no initial skin breakdown was seen; nurse educated on event button; pt being monitored by [facility], same leads.

## 2019-09-24 NOTE — Progress Notes (Signed)
EEG Completed; Results Pending  

## 2019-09-24 NOTE — Consult Note (Signed)
Pt unresponsive, mother and other family members at bedside. The family states pt got sick suddenly. They says that it's 50/50 chance this could go either way. The chaplain offered caring and supportive presence, prayers and blessings. Further visits will be offered.

## 2019-09-24 NOTE — Progress Notes (Addendum)
NAME:  Beverly [name], MRN:  [id number], DOB:  1985/03/21, LOS: 0 ADMISSION DATE:  09/24/2019, CONSULTATION DATE:  8/18 REFERRING MD:  Dr. Eldridge Dace, CHIEF COMPLAINT:  VF arrest   Brief History   34 year old female 2 weeks post partum presented with chest pain and suffered VF arrest.    History of present illness   Patient is encephalopathic and/or intubated. Therefore history has been obtained from chart review.  34 year old female with PMH as below, which is significant for HTN and 2 weeks post-partum. She had emergency C-section delivery due to premature rupture of membranes. She did have some gestational hypertension, but her pregnancy was otherwise uncomplicated. On 8/17 she developed dull chest pain shortly after eating, which prompted ED presentation at [facility]. Resolved spontaneously. No associated symptoms. While in ED, she suffered VT > VF arrest with ROSC after defib x 3 and epi x 1. Immediately after arrest ST elevation was noted on EKG, which did improve on repeat. Differential ACS, SCAD, and toresades/metabolic. Electrolytes were replaced. She was taken to the cath lab at [facility]  Where aneurysmal dilation of the ostial and proximal LAD and was transferred to [facility] for ICU admission.   Past Medical History   has a past medical history of Hypertension and Vaginal Pap smear, abnormal.  Significant Hospital Events   8/17 chest pain, VF arrest  Consults:    Procedures:  8/18> Central Line  Significant Diagnostic Tests:  LHC 8/17 > No coronary artery occlusion or critical stenosis. There is aneurysmal dilation of the ostial and proximal LAD with possible ulceration or dissection of uncertain chronicity.  TIMI-3 flow is noted throughout the LAD and its branches. Severely reduced left ventricular systolic function with mid/apical anterior, apical, and apical inferior hypokinesis/akinesis. LV gram 30% EF.  CT head 8/17 > normal  CTA chest 8/17 > no evidence of PE,  moderate severity infiltrates vs ATX, small bilateral pleural effusions. Very small pericaradial effusion.  CT abdomen/pelvis 8/17 > Enlarged, heterogeneous uterus, Small amount of fluid seen in between the right kidney and right lobe of the liver.   Micro Data:  Blood 8/18 >  Antimicrobials:  Ceftriazone 8/18 > Azithromycin 8/18 >  Interim history/subjective:  Sedated, intubated. Spoke with mother at bedside regarding plan and answered all questions. Continuing to keep normothermic until AM 8/20.   Objective   Blood pressure (!) 115/91, pulse (!) 109, temperature 97.9 F (36.6 C), resp. rate (!) 21, height 5\' 10"  (1.778 m), weight 103.1 kg, SpO2 96 %, unknown if currently breastfeeding.    Vent Mode: PRVC FiO2 (%):  [40 %-100 %] 40 % Set Rate:  [16 bmp] 16 bmp Vt Set:  [450 mL-540 mL] 540 mL PEEP:  [5 cmH20-10 cmH20] 8 cmH20 Plateau Pressure:  [10 cmH20] 10 cmH20   Intake/Output Summary (Last 24 hours) at 09/24/2019 0715 Last data filed at 09/24/2019 0700 Gross per 24 hour  Intake 1215.3 ml  Output 925 ml  Net 290.3 ml   Filed Weights   09/24/19 0026 09/24/19 0500  Weight: 103.1 kg 103.1 kg    Examination: Physical Exam Constitutional:      General: She is not in acute distress.    Appearance: She is not diaphoretic.     Comments: Sedated, intubated. Occasional shivering.  HENT:     Head: Normocephalic and atraumatic.  Eyes:     Conjunctiva/sclera: Conjunctivae normal.     Pupils: Pupils are equal, round, and reactive to light.  Cardiovascular:     Rate and Rhythm: Regular rhythm. Tachycardia present.     Pulses: Normal pulses.     Heart sounds: Normal heart sounds. No murmur heard.  No friction rub. No gallop.   Pulmonary:     Effort: Pulmonary effort is normal.     Breath sounds: Normal breath sounds.     Comments: Coarse breathing sounds auscultated bilaterally.  Abdominal:     General: Abdomen is flat. Bowel sounds are normal.     Tenderness: There is no  abdominal tenderness. There is no guarding.  Musculoskeletal:        General: No swelling.     Right lower leg: No edema.     Left lower leg: No edema.  Neurological:     Comments: Recoils to pain.     Resolved Hospital Problem list     Assessment & Plan:   VF Arrest: New Onset Cardiomyopathy, Peripartum vs Stress Induced Hypokalemia  Etiology not entirely clear. Cath non-revealing for underlying etiology. S/P Central line placement.  - management per cardiology - Amiodarone infusion - ASA, SQ heparin - Febrile on admission on TTM for 37 F to keep normothermic. Will take off 9/20 in the AM.   Acute HFrEF - Management per cardiology.   - Echo pending - Diuresis per primary, UOP O/N.   Acute hypoxemic respiratory failure secondary to cardiac arrest, pulmonary edema. Concern for PNA in L base. WBC trending down 11.6 from 20.3, fevers ON with Tmax of 101.3 F. Currently cooled to 58F - Full vent support, SBT. - Propofol, PRN fentanyl for RASS goal 0 to -1 - VAP bundle - Empiric CAP ceftriaxone and azithromycin one dose given ON.   Hypokalemia K: 3.3 this AM. Repleting.  - replete K goal > 4   Post-partum: 2 weeks post emergency C-section.  - Monitor   Best practice:  Diet: NPO Pain/Anxiety/Delirium protocol (if indicated): Propofol per PAD protocol VAP protocol (if indicated): yes DVT prophylaxis: SQH GI prophylaxis: PPI Glucose control: SSI Mobility: BR Code Status: FULL Family Communication: spoke with mother at bedside.  Disposition: ICU  Labs   CBC: Recent Labs  Lab 09/23/19 1539 09/23/19 1844 09/24/19 0039 09/24/19 0057 09/24/19 0338  WBC 9.0 20.3* 11.6*  --   --   HGB 9.9* 8.4* 9.8* 10.5* 10.2*  HCT 30.5* 26.7* 32.0* 31.0* 30.0*  MCV 81.8 85.0 82.9  --   --   PLT 569* 590* 672*  --   --     Basic Metabolic Panel: Recent Labs  Lab 09/23/19 1539 09/23/19 1851 09/23/19 2240 09/24/19 0039 09/24/19 0057 09/24/19 0338  NA 141  --  137  137  137 137 138  K 3.1*  --  3.1* 2.7*  2.7* 3.0* 3.3*  CL 105  --  103 101  102  --   --   CO2 24  --  21* 21*  21*  --   --   GLUCOSE 98  --  110* 115*  114*  --   --   BUN 6  --  8 8  8   --   --   CREATININE 0.52  --  0.70 0.88  0.94  --   --   CALCIUM 8.4*  --  7.8* 8.0*  8.0*  --   --   MG  --  2.1  --  2.0  --   --    GFR: Estimated Creatinine Clearance: 119.2 mL/min (by C-G formula based on  SCr of 0.88 mg/dL). Recent Labs  Lab 09/23/19 1539 09/23/19 1844 09/24/19 0039  WBC 9.0 20.3* 11.6*    Liver Function Tests: Recent Labs  Lab 09/24/19 0039  AST 200*  ALT 69*  ALKPHOS 99  BILITOT 0.6  PROT 6.9  ALBUMIN 2.7*   No results for input(s): LIPASE, AMYLASE in the last 168 hours. No results for input(s): AMMONIA in the last 168 hours.  ABG    Component Value Date/Time   PHART 7.436 09/24/2019 0338   PCO2ART 33.5 09/24/2019 0338   PO2ART 157 (H) 09/24/2019 0338   HCO3 22.4 09/24/2019 0338   TCO2 23 09/24/2019 0338   ACIDBASEDEF 1.0 09/24/2019 0338   O2SAT 99.0 09/24/2019 0338     Coagulation Profile: Recent Labs  Lab 09/23/19 1844  INR 1.3*    Cardiac Enzymes: No results for input(s): CKTOTAL, CKMB, CKMBINDEX, TROPONINI in the last 168 hours.  HbA1C: Hgb A1c MFr Bld  Date/Time Value Ref Range Status  03/26/2015 02:58 PM 5.8 (H) 4.8 - 5.6 % Final    Comment:             Pre-diabetes: 5.7 - 6.4          Diabetes: >6.4          Glycemic control for adults with diabetes: <7.0     CBG: Recent Labs  Lab 09/23/19 2056 09/24/19 0029 09/24/19 0341  GLUCAP 88 114* 96    Review of Systems:   Patient is encephalopathic and/or intubated. Therefore history has been obtained from chart review.   Past Medical History  She,  has a past medical history of Hypertension and Vaginal Pap smear, abnormal.   Surgical History   No past surgical history on file.   Social History   reports that she has quit smoking. She has never used smokeless  tobacco. She reports current alcohol use. She reports that she does not use drugs.   Family History   Her family history includes Diabetes in her maternal grandmother and paternal grandmother; Hyperlipidemia in her mother; Hypertension in her mother; Rashes / Skin problems in her son; Seizures in her maternal grandmother and son; Thyroid disease in her maternal grandmother.   Allergies No Known Allergies   Home Medications  Prior to Admission medications   Medication Sig Start Date End Date Taking? Authorizing Provider  acetaminophen (TYLENOL) 500 MG tablet Take 500-1,000 mg by mouth every 6 (six) hours as needed for mild pain or fever.    [provider]  ibuprofen (ADVIL) 200 MG tablet Take 400-800 mg by mouth every 6 (six) hours as needed for fever or mild pain.    [provider]  oxyCODONE (OXY IR/ROXICODONE) 5 MG immediate release tablet Take 5 mg by mouth every 4 (four) hours as needed for severe pain.    [provider]         Dolan Amen, MD IMTS, PGY-2 09/24/2019,12:59 PM

## 2019-09-24 NOTE — Consult Note (Addendum)
NAME:  Beverly [name], MRN:  [id number], DOB:  04/28/1985, LOS: 0 ADMISSION DATE:  09/24/2019, CONSULTATION DATE:  8/18 REFERRING MD:  Dr. Eldridge Dace, CHIEF COMPLAINT:  VF arrest   Brief History   34 year old female 2 weeks post partum presented with chest pain and suffered VF arrest.    History of present illness   Patient is encephalopathic and/or intubated. Therefore history has been obtained from chart review.  34 year old female with PMH as below, which is significant for HTN and 2 weeks post-partum. She had emergency C-section delivery due to premature rupture of membranes. She did have some gestational hypertension, but her pregnancy was otherwise uncomplicated. On 8/17 she developed dull chest pain shortly after eating, which prompted ED presentation at [facility]. Resolved spontaneously. No associated symptoms. While in ED, she suffered VT > VF arrest with ROSC after defib x 3 and epi x 1. Immediately after arrest ST elevation was noted on EKG, which did improve on repeat. Differential ACS, SCAD, and toresades/metabolic. Electrolytes were replaced. She was taken to the cath lab at [facility]  Where aneurysmal dilation of the ostial and proximal LAD and was transferred to [facility] for ICU admission.   Past Medical History   has a past medical history of Hypertension and Vaginal Pap smear, abnormal.  Significant Hospital Events   8/17 chest pain, VF arrest  Consults:    Procedures:    Significant Diagnostic Tests:  LHC 8/17 > No coronary artery occlusion or critical stenosis. There is aneurysmal dilation of the ostial and proximal LAD with possible ulceration or dissection of uncertain chronicity.  TIMI-3 flow is noted throughout the LAD and its branches. Severely reduced left ventricular systolic function with mid/apical anterior, apical, and apical inferior hypokinesis/akinesis. LV gram 30% EF.  CT head 8/17 > normal  CTA chest 8/17 > no evidence of PE, moderate severity  infiltrates vs ATX, small bilateral pleural effusions. Very small pericaradial effusion.  CT abdomen/pelvis 8/17 > Enlarged, heterogeneous uterus, Small amount of fluid seen in between the right kidney and right lobe of the liver.   Micro Data:  Blood 8/18 >  Antimicrobials:  Ceftriazone 8/18 > Azithromycin 8/18 >  Interim history/subjective:    Objective   Pulse (!) 120, temperature (!) 101.3 F (38.5 C), temperature source Oral, resp. rate (!) 23, height 5\' 10"  (1.778 m), weight 103.1 kg, unknown if currently breastfeeding.    Vent Mode: PRVC FiO2 (%):  [45 %-100 %] 100 % Set Rate:  [16 bmp] 16 bmp Vt Set:  [450 mL-540 mL] 540 mL PEEP:  [5 cmH20-8 cmH20] 8 cmH20 Plateau Pressure:  [10 cmH20] 10 cmH20  No intake or output data in the 24 hours ending 09/24/19 0053 Filed Weights   09/24/19 0026  Weight: 103.1 kg    Examination: General: overweight young adult female in NAD HENT: [place]/AT, no appreciable JVD Lungs:coarse bilaterally.  Cardiovascular: RRR, no MRG Abdomen: Soft, protuberant.  Extremities: No acute deformity Neuro: Sedated. Occasional eye opening and gagging that is rhythmic in nature. Upward gaze deviation. Pupils 37mm and sluggish.   Resolved Hospital Problem list     Assessment & Plan:   VF arrest: etiology not entirely clear. Likely cardiac of some kind. Stress vs peripartum cardiomyopathy or less likely SCAD per cardiology team. Cath not obvious.   - management per cardiology - Amiodarone infusion - ASA, SQ heparin - Trend troponin - Febrile on admission, will start TTM 36 for fever avoidance as no  cooling blankets are available.  - Will place CVL due to poor venous access and multiple drips.   Acute HFrEF - Management per cardiology.   - Echo pending - Diuresis per primary  Acute hypoxemic respiratory failure secondary to cardiac arrest, pulmonary edema. Concern for PNA in L base.  - Full vent support - SBT in AM - Propofol, PRN fentanyl for  RASS goal 0 to -1 - VAP bundle - Empiric CAP coverage, low threshold to DC.   Hypokalemia - replete K goal > 4   Post-partum: 2 weeks post emergency C-section.  - Monitor   Best practice:  Diet: NPO Pain/Anxiety/Delirium protocol (if indicated): Propofol per PAD protocol VAP protocol (if indicated): yes DVT prophylaxis: SQH GI prophylaxis: PPI Glucose control: SSI Mobility: BR Code Status: FULL Family Communication: Family updated bedside.  Disposition: ICU  Labs   CBC: Recent Labs  Lab 09/23/19 1539 09/23/19 1844  WBC 9.0 20.3*  HGB 9.9* 8.4*  HCT 30.5* 26.7*  MCV 81.8 85.0  PLT 569* 590*    Basic Metabolic Panel: Recent Labs  Lab 09/23/19 1539 09/23/19 1851 09/23/19 2240  NA 141  --  137  K 3.1*  --  3.1*  CL 105  --  103  CO2 24  --  21*  GLUCOSE 98  --  110*  BUN 6  --  8  CREATININE 0.52  --  0.70  CALCIUM 8.4*  --  7.8*  MG  --  2.1  --    GFR: Estimated Creatinine Clearance: 131.2 mL/min (by C-G formula based on SCr of 0.7 mg/dL). Recent Labs  Lab 09/23/19 1539 09/23/19 1844  WBC 9.0 20.3*    Liver Function Tests: No results for input(s): AST, ALT, ALKPHOS, BILITOT, PROT, ALBUMIN in the last 168 hours. No results for input(s): LIPASE, AMYLASE in the last 168 hours. No results for input(s): AMMONIA in the last 168 hours.  ABG    Component Value Date/Time   PHART 7.39 09/23/2019 2044   PCO2ART 37 09/23/2019 2044   PO2ART 75 (L) 09/23/2019 2044   HCO3 22.4 09/23/2019 2044   ACIDBASEDEF 2.3 (H) 09/23/2019 2044   O2SAT 94.7 09/23/2019 2044     Coagulation Profile: Recent Labs  Lab 09/23/19 1844  INR 1.3*    Cardiac Enzymes: No results for input(s): CKTOTAL, CKMB, CKMBINDEX, TROPONINI in the last 168 hours.  HbA1C: Hgb A1c MFr Bld  Date/Time Value Ref Range Status  03/26/2015 02:58 PM 5.8 (H) 4.8 - 5.6 % Final    Comment:             Pre-diabetes: 5.7 - 6.4          Diabetes: >6.4          Glycemic control for adults with  diabetes: <7.0     CBG: Recent Labs  Lab 09/23/19 2056 09/24/19 0029  GLUCAP 88 114*    Review of Systems:   Patient is encephalopathic and/or intubated. Therefore history has been obtained from chart review.   Past Medical History  She,  has a past medical history of Hypertension and Vaginal Pap smear, abnormal.   Surgical History   No past surgical history on file.   Social History   reports that she has quit smoking. She has never used smokeless tobacco. She reports current alcohol use. She reports that she does not use drugs.   Family History   Her family history includes Diabetes in her maternal grandmother and paternal grandmother; Hyperlipidemia in  her mother; Hypertension in her mother; Rashes / Skin problems in her son; Seizures in her maternal grandmother and son; Thyroid disease in her maternal grandmother.   Allergies No Known Allergies   Home Medications  Prior to Admission medications   Medication Sig Start Date End Date Taking? Authorizing Provider  acetaminophen (TYLENOL) 500 MG tablet Take 500-1,000 mg by mouth every 6 (six) hours as needed for mild pain or fever.    [provider]  ibuprofen (ADVIL) 200 MG tablet Take 400-800 mg by mouth every 6 (six) hours as needed for fever or mild pain.    [provider]  oxyCODONE (OXY IR/ROXICODONE) 5 MG immediate release tablet Take 5 mg by mouth every 4 (four) hours as needed for severe pain.    [provider]     Critical care time: 50 mins     Joneen Roach, AGACNP-BC [place] [facility]  See Amion for personal pager PCCM on call pager [phone]  09/24/2019 2:43 AM

## 2019-09-24 NOTE — Progress Notes (Signed)
  Echocardiogram 2D Echocardiogram has been performed.  Delcie Roch 09/24/2019, 4:01 PM

## 2019-09-24 NOTE — Procedures (Signed)
Central Venous Catheter Insertion Procedure Note [place] CURL [id number] 18-Apr-1985  Procedure: Insertion of Central Venous Catheter Indications: Assessment of intravascular volume, Drug and/or fluid administration and Frequent blood sampling  Procedure Details Consent: Risks of procedure as well as the alternatives and risks of each were explained to the (patient/caregiver).  Consent for procedure obtained. Time Out: Verified patient identification, verified procedure, site/side was marked, verified correct patient position, special equipment/implants available, medications/allergies/relevent history reviewed, required imaging and test results available.  Performed  Maximum sterile technique was used including antiseptics, cap, gloves, gown, hand hygiene, mask and sheet. Skin prep: Chlorhexidine; local anesthetic administered A antimicrobial bonded/coated triple lumen catheter was placed in the left internal jugular vein using the Seldinger technique. Catheter placed to 19 cm. Blood aspirated via all 3 ports and then flushed x 3. Line sutured x 2 and dressing applied.  Ultrasound guidance used.Yes.    Evaluation Blood flow good Complications: No apparent complications Patient did tolerate procedure well. Chest X-ray ordered to verify placement.  CXR: pending.       Joneen Roach, AGACNP-BC [place] [facility]  See Amion for personal pager PCCM on call pager [phone]  09/24/2019 3:20 AM

## 2019-09-24 NOTE — Progress Notes (Signed)
Progress Note  Patient Name: Beverly [name] Date of Encounter: 09/24/2019  [facility] Cardiologist: No primary care provider on file.   Subjective   Intubated, sedated, hypothermia protocol.  Mother is at the bedside.  Inpatient Medications    Scheduled Meds: . [START ON 09/25/2019] aspirin EC  81 mg Oral Daily  . chlorhexidine gluconate (MEDLINE KIT)  15 mL Mouth Rinse BID  . Chlorhexidine Gluconate Cloth  6 each Topical Daily  . docusate  100 mg Oral BID  . insulin aspart  0-15 Units Subcutaneous Q4H  . mouth rinse  15 mL Mouth Rinse 10 times per day  . pantoprazole (PROTONIX) IV  40 mg Intravenous Q24H  . polyethylene glycol  17 g Oral Daily  . potassium chloride  40 mEq Per Tube BID  . sodium chloride flush  10-40 mL Intracatheter Q12H   Continuous Infusions: . sodium chloride 10 mL/hr at 09/24/19 0900  . amiodarone 30 mg/hr (09/24/19 0629)  . azithromycin Stopped (09/24/19 0600)  . cefTRIAXone (ROCEPHIN)  IV Stopped (09/24/19 0449)  . levETIRAcetam 1,000 mg (09/24/19 1014)  . propofol (DIPRIVAN) infusion 40 mcg/kg/min (09/24/19 1000)   PRN Meds: acetaminophen, fentaNYL (SUBLIMAZE) injection, fentaNYL (SUBLIMAZE) injection, sodium chloride flush   Vital Signs    Vitals:   09/24/19 0800 09/24/19 0850 09/24/19 0900 09/24/19 1000  BP: (!) 120/92 (!) 117/53 (!) 117/53 117/84  Pulse: (!) 111 (!) 107 (!) 107 (!) 105  Resp: (!) 22 (!) '29 18 15  ' Temp: (!) 97 F (36.1 C) (!) 97.5 F (36.4 C) 98.1 F (36.7 C) 97.9 F (36.6 C)  TempSrc: Esophageal     SpO2: 97% 95% 94% 93%  Weight:      Height:        Intake/Output Summary (Last 24 hours) at 09/24/2019 1045 Last data filed at 09/24/2019 0900 Gross per 24 hour  Intake 1398.38 ml  Output 1000 ml  Net 398.38 ml   Last 3 Weights 09/24/2019 09/24/2019 09/23/2019  Weight (lbs) 227 lb 4.7 oz 227 lb 4.7 oz 235 lb  Weight (kg) 103.1 kg 103.1 kg 106.595 kg  Some encounter information is confidential and restricted.  Go to Review Flowsheets activity to see all data.      Telemetry    Sinus rhythm with no arrhythmia - Personally Reviewed  ECG    Sinus tachycardia 107 bpm, age-indeterminate anterolateral infarct, QT prolongation with QTC 600 ms - Personally Reviewed  Physical Exam  Intubated, sedated on hypothermia protocol GEN: No acute distress.   Neck: No JVD Cardiac:  Tachycardic and regular, no murmurs, rubs, or gallops.  Respiratory:  Coarse breath sounds bilaterally. GI: Soft, nontender, non-distended  MS: No edema; No deformity. Neuro:  Nonfocal  Psych: Normal affect   Labs    High Sensitivity Troponin:   Recent Labs  Lab 09/23/19 1539 09/23/19 1816 09/24/19 0039 09/24/19 0826  TROPONINIHS 127* 2,078* 24,421* >27,000*      Chemistry Recent Labs  Lab 09/23/19 2240 09/23/19 2240 09/24/19 0039 09/24/19 0039 09/24/19 0057 09/24/19 0338 09/24/19 0826  NA 137   < > 137  137   < > 137 138 138  K 3.1*   < > 2.7*  2.7*   < > 3.0* 3.3* 4.1  CL 103  --  101  102  --   --   --  102  CO2 21*  --  21*  21*  --   --   --  21*  GLUCOSE 110*  --  115*  114*  --   --   --  125*  BUN 8  --  8  8  --   --   --  7  CREATININE 0.70  --  0.88  0.94  --   --   --  0.75  CALCIUM 7.8*  --  8.0*  8.0*  --   --   --  8.5*  PROT  --   --  6.9  --   --   --   --   ALBUMIN  --   --  2.7*  --   --   --   --   AST  --   --  200*  --   --   --   --   ALT  --   --  69*  --   --   --   --   ALKPHOS  --   --  99  --   --   --   --   BILITOT  --   --  0.6  --   --   --   --   GFRNONAA >60  --  >60  >60  --   --   --  >60  GFRAA >60  --  >60  >60  --   --   --  >60  ANIONGAP 13  --  15  14  --   --   --  15   < > = values in this interval not displayed.     Hematology Recent Labs  Lab 09/23/19 1844 09/23/19 1844 09/24/19 0039 09/24/19 0039 09/24/19 0057 09/24/19 0338 09/24/19 0826  WBC 20.3*  --  11.6*  --   --   --  19.0*  RBC 3.14*  --  3.86*  --   --   --  3.80*  HGB 8.4*    < > 9.8*   < > 10.5* 10.2* 9.7*  HCT 26.7*   < > 32.0*   < > 31.0* 30.0* 32.0*  MCV 85.0  --  82.9  --   --   --  84.2  MCH 26.8  --  25.4*  --   --   --  25.5*  MCHC 31.5  --  30.6  --   --   --  30.3  RDW 16.6*  --  16.5*  --   --   --  16.8*  PLT 590*  --  672*  --   --   --  757*   < > = values in this interval not displayed.    BNP Recent Labs  Lab 09/23/19 1539 09/24/19 0039  BNP 72.4 250.4*     DDimer No results for input(s): DDIMER in the last 168 hours.   Radiology    DG Chest 1 View  Result Date: 09/24/2019 CLINICAL DATA:  Post cardiac arrest. EXAM: CHEST  1 VIEW COMPARISON:  Radiograph and CT yesterday. FINDINGS: Endotracheal tube tip 2.3 cm from the carina. Enteric tube tip and side-port below the diaphragm not included in the field of view. Similar cardiomegaly, pericardial effusion on prior CT. Slight progression in diffuse bilateral airspace opacities. More confluent retrocardiac opacity. Small pleural effusions which were better seen on CT. No pneumothorax. IMPRESSION: 1. Endotracheal tube tip 2.3 cm from the carina. Enteric tube tip and side-port below the diaphragm not included in the field of view. 2. Slight progression in diffuse bilateral airspace opacities, pulmonary edema versus multifocal  pneumonia. 3. Similar cardiomegaly, pericardial effusion on prior CT. Electronically Signed   By: Keith Rake M.D.   On: 09/24/2019 01:37   DG Chest 2 View  Result Date: 09/23/2019 CLINICAL DATA:  34 year old female with chest pain EXAM: CHEST - 2 VIEW COMPARISON:  None. FINDINGS: Small bilateral pleural effusions, right greater left. Minimal bibasilar atelectasis. Infiltrate is not excluded clinical correlation is recommended. No pneumothorax. There is mild cardiomegaly. No acute osseous pathology. IMPRESSION: 1. Small bilateral pleural effusions, right greater left. 2. Mild cardiomegaly. Electronically Signed   By: Anner Crete M.D.   On: 09/23/2019 16:19   DG  Abdomen 1 View  Result Date: 09/23/2019 CLINICAL DATA:  Intubated, enteric catheter placement EXAM: ABDOMEN - 1 VIEW COMPARISON:  None. FINDINGS: Two frontal views of the lower chest and upper abdomen demonstrates enteric catheter tip projecting over gastric body. Defibrillator pad overlies the cardiac apex. Bowel gas pattern is unremarkable. IMPRESSION: 1. Enteric catheter overlying gastric body. Electronically Signed   By: Randa Ngo M.D.   On: 09/23/2019 18:43   CT Head Wo Contrast  Result Date: 09/23/2019 CLINICAL DATA:  Cardiac arrest EXAM: CT HEAD WITHOUT CONTRAST TECHNIQUE: Contiguous axial images were obtained from the base of the skull through the vertex without intravenous contrast. COMPARISON:  None. FINDINGS: Brain: There is no mass, hemorrhage or extra-axial collection. The size and configuration of the ventricles and extra-axial CSF spaces are normal. The brain parenchyma is normal, without acute or chronic infarction. Punctate colloid cyst at the level of the foramina of [place] is incidentally noted. Vascular: No abnormal hyperdensity of the major intracranial arteries or dural venous sinuses. No intracranial atherosclerosis. Skull: The visualized skull base, calvarium and extracranial soft tissues are normal. Sinuses/Orbits: No fluid levels or advanced mucosal thickening of the visualized paranasal sinuses. No mastoid or middle ear effusion. The orbits are normal. IMPRESSION: Normal head CT. Electronically Signed   By: Ulyses Jarred M.D.   On: 09/23/2019 20:43   CT Angio Chest PE W/Cm &/Or Wo Cm  Result Date: 09/23/2019 CLINICAL DATA:  Chest pain. COVID positive. EXAM: CT ANGIOGRAPHY CHEST WITH CONTRAST TECHNIQUE: Multidetector CT imaging of the chest was performed using the standard protocol during bolus administration of intravenous contrast. Multiplanar CT image reconstructions and MIPs were obtained to evaluate the vascular anatomy. CONTRAST:  145m OMNIPAQUE IOHEXOL 350 MG/ML SOLN  COMPARISON:  None. FINDINGS: Cardiovascular: Satisfactory opacification of the pulmonary arteries to the segmental level. No evidence of pulmonary embolism. Normal heart size. A very small pericardial effusion is present. Mediastinum/Nodes: No enlarged mediastinal, hilar, or axillary lymph nodes. Endotracheal and nasogastric tubes are in place. Thyroid gland, trachea, and esophagus demonstrate no significant findings. Lungs/Pleura: Moderate severity infiltrates and/or atelectasis are seen within the bilateral upper lobes, right greater than left, and posterior aspects of the bilateral lower lobes, left greater than right. Small bilateral pleural effusions are seen. No pneumothorax is identified. Upper Abdomen: A mild amount of fluid is suspected within the region in between the right kidney and liver. Musculoskeletal: No chest wall abnormality. No acute or significant osseous findings. Review of the MIP images confirms the above findings. IMPRESSION: 1. No CT evidence of acute pulmonary embolism. 2. Moderate severity infiltrates and/or atelectasis along the bilateral upper lobes, right greater than left, and posterior aspects of the bilateral lower lobes, left greater than right. 3. Small bilateral pleural effusions. 4. Very small pericardial effusion. 5. Mild amount of fluid suspected within the region in between the right kidney and  liver. Electronically Signed   By: Virgina Norfolk M.D.   On: 09/23/2019 20:43   CT Abdomen Pelvis W Contrast  Result Date: 09/23/2019 CLINICAL DATA:  Abdominal pain. EXAM: CT ABDOMEN AND PELVIS WITH CONTRAST TECHNIQUE: Multidetector CT imaging of the abdomen and pelvis was performed using the standard protocol following bolus administration of intravenous contrast. CONTRAST:  125m OMNIPAQUE IOHEXOL 350 MG/ML SOLN COMPARISON:  None. FINDINGS: Lower chest: Moderate severity areas of atelectasis and/or infiltrate are seen within the posterior aspect of the bilateral lung bases,  left greater than right. Small bilateral pleural effusions are seen. A small pericardial effusion is noted. Hepatobiliary: No focal liver abnormality is seen. No gallstones, gallbladder wall thickening, or biliary dilatation. Pancreas: Unremarkable. No pancreatic ductal dilatation or surrounding inflammatory changes. Spleen: Normal in size without focal abnormality. Adrenals/Urinary Tract: Adrenal glands are unremarkable. Kidneys are normal, without renal calculi, focal lesion, or hydronephrosis. A Foley catheter is seen within the lumen of the urinary bladder. Stomach/Bowel: A nasogastric tube is seen with its distal tip noted within the body of the stomach. Stomach is within normal limits. Appendix appears normal. No evidence of bowel dilatation. Vascular/Lymphatic: No significant vascular findings are present. No enlarged abdominal or pelvic lymph nodes. Reproductive: The uterus is enlarged and heterogeneous in appearance. Other: A C-section scar seen along the midline of the anterior pelvic wall. A small amount of fluid is seen in between the right kidney and right lobe of the liver, as well as within the posterior aspect of the pelvis. Musculoskeletal: No acute or significant osseous findings. IMPRESSION: 1. Moderate severity areas of atelectasis and/or infiltrate within the posterior aspect of the bilateral lung bases, left greater than right, with small bilateral pleural effusions. 2. Small pericardial effusion. 3. Enlarged, heterogeneous uterus, likely representing a fibroid uterus. 4. Small amount of fluid seen in between the right kidney and right lobe of the liver, as well as within the posterior aspect of the pelvis. Electronically Signed   By: TVirgina NorfolkM.D.   On: 09/23/2019 20:48   CARDIAC CATHETERIZATION  Result Date: 09/23/2019 Conclusions: 1. No coronary artery occlusion or critical stenosis. 2. There is aneurysmal dilation of the ostial and proximal LAD with possible ulceration or  dissection of uncertain chronicity.  TIMI-3 flow is noted throughout the LAD and its branches. 3. No angiographically significant disease involving the LCx or RCA. 4. Severely reduced left ventricular systolic function with mid/apical anterior, apical, and apical inferior hypokinesis/akinesis. 5. Severely elevated left ventricular filling pressure. Recommendations: 1. Continue indefinite aspirin 81 mg daily.  Defer P2Y12 inhibitor given recent acute blood loss anemia in the setting of c-section. 2. Defer IV heparin given concern for possible dissection involving aneurysmal segment of proximal LAD. 3. [facility] and [facility] consultation/intervention if the patient were to become unstable with evidence of compromised flow in the LAD. 4. Continue amiodarone infusion. 5. Continue diuresis; patient received furosemide 40 mg IV x 1 at the end of catheterization. 6. Add evidence based medical therapy for heart failure as blood pressure tolerates and the patient is better compensated. CNelva Bush MD [facility]   DG CHEST PORT 1 VIEW  Result Date: 09/24/2019 CLINICAL DATA:  Central line EXAM: PORTABLE CHEST 1 VIEW COMPARISON:  Earlier today FINDINGS: New left IJ line with tip at the upper right atrium. No pneumothorax or mediastinal widening. Enteric tube which reaches the stomach, which remains gas distended. Endotracheal tube tip is just below the clavicular heads. Cardiopericardial  enlargement and bilateral airspace disease. IMPRESSION: 1. New central line with tip at the upper right atrium. No pneumothorax. 2. Unremarkable enteric tube positioning. The stomach remains gas distended. 3. Stable airspace disease and cardiac enlargement. Electronically Signed   By: Monte Fantasia M.D.   On: 09/24/2019 04:22   DG Chest Portable 1 View  Result Date: 09/23/2019 CLINICAL DATA:  Chest pain, intubated EXAM: PORTABLE CHEST 1 VIEW COMPARISON:  09/23/2019 FINDINGS: Single  frontal view of the chest demonstrates endotracheal tube overlying tracheal air column tip 1.6 cm above carina. Enteric catheter passes below the diaphragm tip excluded by collimation. External defibrillator pads overlie the cardiac apex. The cardiac silhouette is enlarged but stable. Progressive central vascular congestion with small bilateral pleural effusions. No pneumothorax. IMPRESSION: 1. Support devices as above. 2. Progressive central vascular congestion with trace bilateral pleural effusion. Electronically Signed   By: Randa Ngo M.D.   On: 09/23/2019 18:42   EEG adult  Result Date: 09/24/2019 [name], MD     09/24/2019  9:55 AM Patient Name: FEY COGHILL MRN: [id number] Epilepsy Attending: [name] Referring Physician/Provider: Dr. Therese [name] Date: 09/24/2019 Duration: 25.44 mins Patient history: 34 year old female status post cardiac arrest and seizure-like activity.  EEG to assess for seizures. Level of alertness: Comatose AEDs during EEG study: Keppra, propofol Technical aspects: This EEG study was done with scalp electrodes positioned according to the 10-20 International system of electrode placement. Electrical activity was acquired at a sampling rate of '500Hz'  and reviewed with a high frequency filter of '70Hz'  and a low frequency filter of '1Hz' . EEG data were recorded continuously and digitally stored. Description: EEG showed continuous generalized 3 to 5 Hz theta-delta slowing which at times appeared sharply contoured.  EEG was reactive to tactile stimulation.  Hyperventilation and photic stimulation were not performed.   ABNORMALITY -Continuous slow, generalized IMPRESSION: This study is suggestive of severe diffuse encephalopathy, nonspecific to etiology.  No seizures or definite epileptiform discharges were seen throughout the recording. [name]    Cardiac Studies   See section above  Patient Profile     34 y.o. female with anterior MI and  ventricular fibrillation cardiac arrest  Assessment & Plan    1.  VF arrest/torsades: Suspect ischemia driven.  Reviewed cardiac catheterization films.  At the time of heart catheterization following her arrest, the patient was found to have aneurysmal dilatation of the proximal LAD with a filling defect suggestive of dissection.  Clinical scenario in a young woman 2 weeks postpartum puts her at high risk of spontaneous coronary artery dissection.  Agree with current supportive measures.  The patient is treated with aspirin.  Probably best to avoid heparin at this point unless recurrent ischemia is demonstrated.  She has had a large infarct with troponin greater than 27,000.  Awaiting 2D echocardiogram.  Currently stable without further arrhythmia on IV amiodarone. 2.  Acute encephalopathy: TTM management per the critical care team.  The patient will undergo cooling over the next 24 hours.  Explained the rationale of this to the patient's mother at the bedside. 3.  Acute systolic heart failure: Diuresed with IV furosemide as tolerated.  Await 2D echocardiogram.  Bilateral pulmonary edema demonstrated on chest x-ray this morning. 4.  QT prolongation: Suspect combination of amiodarone, azithromycin, and hypothermia.  We will stop amiodarone this morning as her rhythm is stable.  We will start patient on low-dose beta-blockade.  The patient is critically ill with multiple organ systems failure  and requires high complexity decision making for assessment and support, frequent evaluation and titration of therapies, application of advanced monitoring technologies and extensive interpretation of multiple databases.   Critical Care Time devoted to patient care services described in this note is 40 minutes.  For questions or updates, please contact [place] Please consult [email or web address] for contact info under        Signed, Sherren Mocha, MD  09/24/2019, 10:45 AM

## 2019-09-24 NOTE — Progress Notes (Signed)
[place] pads placed and 36 TTM started at 0130

## 2019-09-24 NOTE — Procedures (Signed)
Patient Name: Beverly [name]  MRN: [id number]  Epilepsy Attending: Charlsie Quest  Referring Physician/Provider: Dr. Lindie Spruce Date: 09/24/2019 Duration: 25.44 mins  Patient history: 34 year old female status post cardiac arrest and seizure-like activity.  EEG to assess for seizures.  Level of alertness: Comatose  AEDs during EEG study: Keppra, propofol  Technical aspects: This EEG study was done with scalp electrodes positioned according to the 10-20 International system of electrode placement. Electrical activity was acquired at a sampling rate of 500Hz  and reviewed with a high frequency filter of 70Hz  and a low frequency filter of 1Hz . EEG data were recorded continuously and digitally stored.   Description: EEG showed continuous generalized 3 to 5 Hz theta-delta slowing which at times appeared sharply contoured.  EEG was reactive to tactile stimulation.  Hyperventilation and photic stimulation were not performed.     ABNORMALITY -Continuous slow, generalized  IMPRESSION: This study is suggestive of severe diffuse encephalopathy, nonspecific to etiology.  No seizures or definite epileptiform discharges were seen throughout the recording.   [name] 

## 2019-09-25 LAB — CBC
HCT: 32.7 % — ABNORMAL LOW (ref 36.0–46.0)
Hemoglobin: 10 g/dL — ABNORMAL LOW (ref 12.0–15.0)
MCH: 26 pg (ref 26.0–34.0)
MCHC: 30.6 g/dL (ref 30.0–36.0)
MCV: 85.2 fL (ref 80.0–100.0)
Platelets: 697 10*3/uL — ABNORMAL HIGH (ref 150–400)
RBC: 3.84 MIL/uL — ABNORMAL LOW (ref 3.87–5.11)
RDW: 16.8 % — ABNORMAL HIGH (ref 11.5–15.5)
WBC: 12.5 10*3/uL — ABNORMAL HIGH (ref 4.0–10.5)
nRBC: 0 % (ref 0.0–0.2)

## 2019-09-25 LAB — GLUCOSE, CAPILLARY
Glucose-Capillary: 107 mg/dL — ABNORMAL HIGH (ref 70–99)
Glucose-Capillary: 61 mg/dL — ABNORMAL LOW (ref 70–99)
Glucose-Capillary: 61 mg/dL — ABNORMAL LOW (ref 70–99)
Glucose-Capillary: 66 mg/dL — ABNORMAL LOW (ref 70–99)
Glucose-Capillary: 71 mg/dL (ref 70–99)
Glucose-Capillary: 74 mg/dL (ref 70–99)
Glucose-Capillary: 84 mg/dL (ref 70–99)
Glucose-Capillary: 85 mg/dL (ref 70–99)

## 2019-09-25 LAB — COMPREHENSIVE METABOLIC PANEL
ALT: 64 U/L — ABNORMAL HIGH (ref 0–44)
AST: 151 U/L — ABNORMAL HIGH (ref 15–41)
Albumin: 2.7 g/dL — ABNORMAL LOW (ref 3.5–5.0)
Alkaline Phosphatase: 95 U/L (ref 38–126)
Anion gap: 14 (ref 5–15)
BUN: 10 mg/dL (ref 6–20)
CO2: 21 mmol/L — ABNORMAL LOW (ref 22–32)
Calcium: 8.9 mg/dL (ref 8.9–10.3)
Chloride: 101 mmol/L (ref 98–111)
Creatinine, Ser: 0.74 mg/dL (ref 0.44–1.00)
GFR calc Af Amer: >60 mL/min (ref >60)
GFR calc non Af Amer: >60 mL/min (ref >60)
Glucose, Bld: 83 mg/dL (ref 70–99)
Potassium: 3.8 mmol/L (ref 3.5–5.1)
Sodium: 136 mmol/L (ref 135–145)
Total Bilirubin: 0.7 mg/dL (ref 0.3–1.2)
Total Protein: 6.9 g/dL (ref 6.5–8.1)

## 2019-09-25 LAB — PHOSPHORUS: Phosphorus: 4 mg/dL (ref 2.5–4.6)

## 2019-09-25 LAB — TRIGLYCERIDES: Triglycerides: 155 mg/dL — ABNORMAL HIGH (ref <150)

## 2019-09-25 LAB — MAGNESIUM: Magnesium: 1.8 mg/dL (ref 1.7–2.4)

## 2019-09-25 MED ORDER — CARVEDILOL 3.125 MG PO TABS
3.1250 mg | ORAL_TABLET | Freq: Two times a day (BID) | ORAL | Status: DC
  Administered 2019-09-25 – 2019-09-26 (×3): 3.125 mg
  Filled 2019-09-25 (×2): qty 1

## 2019-09-25 MED ORDER — CARVEDILOL 3.125 MG PO TABS: 3.1250 mg | ORAL_TABLET | Freq: Two times a day (BID) | ORAL | Status: DC

## 2019-09-25 MED ORDER — FUROSEMIDE 10 MG/ML IJ SOLN
20.0000 mg | Freq: Once | INTRAMUSCULAR | Status: AC
  Administered 2019-09-25: 20 mg via INTRAVENOUS
  Filled 2019-09-25: qty 2

## 2019-09-25 MED ORDER — DEXTROSE 50 % IV SOLN
INTRAVENOUS | Status: AC
  Administered 2019-09-25: 50 mL
  Filled 2019-09-25: qty 50

## 2019-09-25 MED ORDER — ASPIRIN 81 MG PO CHEW
81.0000 mg | CHEWABLE_TABLET | Freq: Every day | ORAL | Status: DC
  Administered 2019-09-25 – 2019-11-28 (×65): 81 mg
  Filled 2019-09-25 (×64): qty 1

## 2019-09-25 MED ORDER — MAGNESIUM SULFATE 2 GM/50ML IV SOLN
2.0000 g | Freq: Once | INTRAVENOUS | Status: AC
  Administered 2019-09-25: 2 g via INTRAVENOUS
  Filled 2019-09-25: qty 50

## 2019-09-25 MED ORDER — PROSOURCE TF PO LIQD
90.0000 mL | Freq: Three times a day (TID) | ORAL | Status: DC
  Administered 2019-09-25 – 2019-09-28 (×10): 90 mL
  Administered 2019-09-28: 45 mL
  Administered 2019-09-29 (×2): 90 mL
  Filled 2019-09-25 (×12): qty 90

## 2019-09-25 MED ORDER — LEVETIRACETAM IN NACL 1000 MG/100ML IV SOLN
1000.0000 mg | Freq: Once | INTRAVENOUS | Status: AC
  Administered 2019-09-25: 1000 mg via INTRAVENOUS
  Filled 2019-09-25: qty 100

## 2019-09-25 MED ORDER — LEVETIRACETAM IN NACL 1500 MG/100ML IV SOLN
1500.0000 mg | Freq: Two times a day (BID) | INTRAVENOUS | Status: DC
  Administered 2019-09-25 – 2019-10-06 (×23): 1500 mg via INTRAVENOUS
  Filled 2019-09-25 (×24): qty 100

## 2019-09-25 MED ORDER — VITAL HIGH PROTEIN PO LIQD
1000.0000 mL | ORAL | Status: DC
  Administered 2019-09-25 – 2019-09-29 (×5): 1000 mL

## 2019-09-25 MED ORDER — POTASSIUM CHLORIDE 20 MEQ/15ML (10%) PO SOLN
40.0000 meq | Freq: Once | ORAL | Status: AC
  Administered 2019-09-25: 40 meq via ORAL
  Filled 2019-09-25: qty 30

## 2019-09-25 MED ORDER — MIDAZOLAM HCL 2 MG/2ML IJ SOLN
1.0000 mg | INTRAMUSCULAR | Status: DC | PRN
  Administered 2019-09-26 (×3): 2 mg via INTRAVENOUS
  Administered 2019-09-28: 1 mg via INTRAVENOUS
  Administered 2019-09-28: 2 mg via INTRAVENOUS
  Administered 2019-09-29 (×3): 1 mg via INTRAVENOUS
  Filled 2019-09-25 (×7): qty 2

## 2019-09-25 MED FILL — Fentanyl Citrate Preservative Free (PF) Inj 100 MCG/2ML: INTRAMUSCULAR | Qty: 2 | Status: AC

## 2019-09-25 NOTE — Progress Notes (Signed)
eLink Physician-Brief Progress Note Patient Name: Beverly [name] DOB: [birth year] MRN: [id number]   Date of Service  09/25/2019  HPI/Events of Note  Patient appeared to posture and this was reported to the neurologist who reviewed her cEEG and felt was suggestive of seizures so he increased her Keppra dose.  eICU Interventions  Will add PRN Versed for treatment of active seizures and to supplement her propofol since bedside RN reports that she has been intermittently biting on her ET tube.        Thomasene Lot Zebastian Carico 09/25/2019, 4:06 AM

## 2019-09-25 NOTE — Procedures (Addendum)
Patient Name: Beverly [name]  MRN: [id number]  Epilepsy Attending: Charlsie Quest  Referring Physician/Provider: Dr. Lindie Spruce Duration: 09/24/2019 0950 to 09/25/2019 0950  Patient history: 34 year old female status post cardiac arrest and seizure-like activity.  EEG to assess for seizures.  Level of alertness: Comatose  AEDs during EEG study: Keppra, propofol  Technical aspects: This EEG study was done with scalp electrodes positioned according to the 10-20 International system of electrode placement. Electrical activity was acquired at a sampling rate of 500Hz  and reviewed with a high frequency filter of 70Hz  and a low frequency filter of 1Hz . EEG data were recorded continuously and digitally stored.   Description: EEG showed continuous generalized 3 to 5 Hz theta-delta slowing which at times appeared sharply contoured. EEG was reactive to tactile stimulation.  Hyperventilation and photic stimulation were not performed.    Event button was pressed on 09/25/2019 at 0339 for unclear reasons. Concomitant eeg before, during and after the event didn't show any eeg change to suggest seizure.  ABNORMALITY -Continuous slow, generalized  IMPRESSION: This study is suggestive of severe diffuse encephalopathy, nonspecific to etiology.  No seizures or definite epileptiform discharges were seen throughout the recording.   Beverly [name] 

## 2019-09-25 NOTE — Progress Notes (Signed)
NAME:  Beverly [name], MRN:  [id number], DOB:  1985-05-29, LOS: 1 ADMISSION DATE:  09/24/2019, CONSULTATION DATE:  8/18 REFERRING MD:  Dr. Eldridge Dace, CHIEF COMPLAINT:  VF arrest   Brief History   34 year old female 2 weeks post partum presented with chest pain and suffered VF arrest.    History of present illness   Patient is encephalopathic and/or intubated. Therefore history has been obtained from chart review.  34 year old female with PMH as below, which is significant for HTN and 2 weeks post-partum. She had emergency C-section delivery due to premature rupture of membranes. She did have some gestational hypertension, but her pregnancy was otherwise uncomplicated. On 8/17 she developed dull chest pain shortly after eating, which prompted ED presentation at [facility]. Resolved spontaneously. No associated symptoms. While in ED, she suffered VT > VF arrest with ROSC after defib x 3 and epi x 1. Immediately after arrest ST elevation was noted on EKG, which did improve on repeat. Differential ACS, SCAD, and toresades/metabolic. Electrolytes were replaced. She was taken to the cath lab at [facility]  Where aneurysmal dilation of the ostial and proximal LAD and was transferred to [facility] for ICU admission.   Past Medical History   has a past medical history of Hypertension and Vaginal Pap smear, abnormal.  Significant Hospital Events   8/17 chest pain, VF arrest  Consults:    Procedures:  8/18> Central Line  Significant Diagnostic Tests:  LHC 8/17 > No coronary artery occlusion or critical stenosis. There is aneurysmal dilation of the ostial and proximal LAD with possible ulceration or dissection of uncertain chronicity.  TIMI-3 flow is noted throughout the LAD and its branches. Severely reduced left ventricular systolic function with mid/apical anterior, apical, and apical inferior hypokinesis/akinesis. LV gram 30% EF.  CT head 8/17 > normal  CTA chest 8/17 > no evidence of PE,  moderate severity infiltrates vs ATX, small bilateral pleural effusions. Very small pericaradial effusion.  CT abdomen/pelvis 8/17 > Enlarged, heterogeneous uterus, Small amount of fluid seen in between the right kidney and right lobe of the liver. Echo 8/18> LVEF 30-35%, septal and apical akinesis distal anterior wall an d mid/distal inferior wall hypokinesis hyperdynamic basal function. LV size mildly dilated.    Micro Data:  Blood 8/18 >  Antimicrobials:  Ceftriazone 8/18 > Azithromycin 8/18 >  Interim history/subjective:   Possible seizure activity reported ON, spoke with neurology and no seizure events on EEG. Spoke with mother about weaning sedation tonight, and seeing how [name] is Friday morning. All questions and concerns were addressed.   Objective   Blood pressure 125/86, pulse (!) 111, temperature 99.1 F (37.3 C), resp. rate 19, height 5\' 10"  (1.778 m), weight 104.5 kg, SpO2 96 %, unknown if currently breastfeeding.    Vent Mode: PRVC FiO2 (%):  [40 %] 40 % Set Rate:  [16 bmp] 16 bmp Vt Set:  [540 mL] 540 mL PEEP:  [8 cmH20] 8 cmH20 Plateau Pressure:  [9 cmH20-22 cmH20] 22 cmH20   Intake/Output Summary (Last 24 hours) at 09/25/2019 0739 Last data filed at 09/25/2019 0700 Gross per 24 hour  Intake 1525.29 ml  Output 2915 ml  Net -1389.71 ml   Filed Weights   09/24/19 0026 09/24/19 0500 09/25/19 0400  Weight: 103.1 kg 103.1 kg 104.5 kg    Examination: Physical Exam Constitutional:      General: She is not in acute distress.    Appearance: Normal appearance.  Comments: Sedated, intubated  HENT:     Head: Normocephalic and atraumatic.  Cardiovascular:     Rate and Rhythm: Regular rhythm. Tachycardia present.     Heart sounds: Normal heart sounds. No murmur heard.  No friction rub. No gallop.   Pulmonary:     Effort: Pulmonary effort is normal.     Breath sounds: No wheezing, rhonchi or rales.  Abdominal:     General: Abdomen is flat. Bowel sounds are  normal.     Tenderness: There is no abdominal tenderness.  Musculoskeletal:     Right lower leg: No edema.     Left lower leg: No edema.      Resolved Hospital Problem list   Hypokalemia  Assessment & Plan:   VF Arrest: New Onset Peripartum Cardiomyopathy: No further VF/VT, amio DC'd yesterday in light of prolonged QTc, which has normalized this AM.  - management per cardiology - ASA, SQ heparin - TTM to keep normothermic 37 F weaning off sedation at midnight.   Acute HFrEF   Given total lasix of 80 mg IV yesterday with total 2.7L Output, good UOP down 1.3L today. Echo showed LVEF of 30-35% with anterior infarct.  - Coreg 3.125 mg BID by cardiology - Continue to diurese per primary.   Acute Hypoxemic Respiratory Failure 2/2 to Cardiac Arrest, Pulmonary Edema.  - Full vent support, SBT. - Propofol, PRN fentanyl, PRN Versed for RASS goal 0 to -1 - VAP bundle   Hypokalemia K: 3.8 this AM. Continuing to replete as needed for K>4.  - replete K goal > 4   Post-partum: 2 weeks post emergency C-section.  - Monitor   Best practice:  Diet: NPO Pain/Anxiety/Delirium protocol (if indicated): Propofol per PAD protocol VAP protocol (if indicated): yes DVT prophylaxis: SCDs GI prophylaxis: PPI Glucose control: SSI Mobility: BR Code Status: FULL Family Communication: spoke with mother at bedside.  Disposition: ICU  Labs   CBC: Recent Labs  Lab 09/23/19 1539 09/23/19 1539 09/23/19 1844 09/23/19 1844 09/24/19 0039 09/24/19 0057 09/24/19 0338 09/24/19 0826 09/25/19 0325  WBC 9.0  --  20.3*  --  11.6*  --   --  19.0* 12.5*  HGB 9.9*   < > 8.4*   < > 9.8* 10.5* 10.2* 9.7* 10.0*  HCT 30.5*   < > 26.7*   < > 32.0* 31.0* 30.0* 32.0* 32.7*  MCV 81.8  --  85.0  --  82.9  --   --  84.2 85.2  PLT 569*  --  590*  --  672*  --   --  757* 697*   < > = values in this interval not displayed.    Basic Metabolic Panel: Recent Labs  Lab 09/23/19 1539 09/23/19 1539 09/23/19 1851  09/23/19 2240 09/23/19 2240 09/24/19 0039 09/24/19 0057 09/24/19 0338 09/24/19 0826 09/24/19 1630  NA 141   < >  --  137   < > 137  137 137 138 138 138  K 3.1*   < >  --  3.1*   < > 2.7*  2.7* 3.0* 3.3* 4.1 3.7  CL 105  --   --  103  --  101  102  --   --  102 102  CO2 24  --   --  21*  --  21*  21*  --   --  21* 22  GLUCOSE 98  --   --  110*  --  115*  114*  --   --  125* 90  BUN 6  --   --  8  --  8  8  --   --  7 8  CREATININE 0.52  --   --  0.70  --  0.88  0.94  --   --  0.75 0.74  CALCIUM 8.4*  --   --  7.8*  --  8.0*  8.0*  --   --  8.5* 8.8*  MG  --   --  2.1  --   --  2.0  --   --  2.0  --   PHOS  --   --   --   --   --   --   --   --  4.5  --    < > = values in this interval not displayed.   GFR: Estimated Creatinine Clearance: 132.1 mL/min (by C-G formula based on SCr of 0.74 mg/dL). Recent Labs  Lab 09/23/19 1844 09/24/19 0039 09/24/19 0826 09/25/19 0325  WBC 20.3* 11.6* 19.0* 12.5*    Liver Function Tests: Recent Labs  Lab 09/24/19 0039  AST 200*  ALT 69*  ALKPHOS 99  BILITOT 0.6  PROT 6.9  ALBUMIN 2.7*   No results for input(s): LIPASE, AMYLASE in the last 168 hours. No results for input(s): AMMONIA in the last 168 hours.  ABG    Component Value Date/Time   PHART 7.436 09/24/2019 0338   PCO2ART 33.5 09/24/2019 0338   PO2ART 157 (H) 09/24/2019 0338   HCO3 22.4 09/24/2019 0338   TCO2 23 09/24/2019 0338   ACIDBASEDEF 1.0 09/24/2019 0338   O2SAT 99.0 09/24/2019 0338     Coagulation Profile: Recent Labs  Lab 09/23/19 1844 09/24/19 0824  INR 1.3* 1.2    Cardiac Enzymes: No results for input(s): CKTOTAL, CKMB, CKMBINDEX, TROPONINI in the last 168 hours.  HbA1C: Hgb A1c MFr Bld  Date/Time Value Ref Range Status  09/24/2019 08:26 AM 5.7 (H) 4.8 - 5.6 % Final    Comment:    (NOTE) Pre diabetes:          5.7%-6.4%  Diabetes:              >6.4%  Glycemic control for   <7.0% adults with diabetes   03/26/2015 02:58 PM 5.8 (H) 4.8  - 5.6 % Final    Comment:             Pre-diabetes: 5.7 - 6.4          Diabetes: >6.4          Glycemic control for adults with diabetes: <7.0     CBG: Recent Labs  Lab 09/24/19 1502 09/24/19 1938 09/24/19 2346 09/25/19 0411 09/25/19 0414  GLUCAP 81 79 81 66* 84    Review of Systems:   Patient is encephalopathic and/or intubated. Therefore history has been obtained from chart review.   Past Medical History  She,  has a past medical history of Hypertension and Vaginal Pap smear, abnormal.   Surgical History    Past Surgical History:  Procedure Laterality Date  . LEFT HEART CATH AND CORONARY ANGIOGRAPHY N/A 09/23/2019   Procedure: LEFT HEART CATH AND CORONARY ANGIOGRAPHY;  Surgeon: Yvonne KendallEnd, Christopher, MD;  Location: [facility] INVASIVE CV LAB;  Service: Cardiovascular;  Laterality: N/A;     Social History   reports that she has quit smoking. She has never used smokeless tobacco. She reports current alcohol use. She reports that she does not use drugs.   Family History  Her family history includes Diabetes in her maternal grandmother and paternal grandmother; Hyperlipidemia in her mother; Hypertension in her mother; Rashes / Skin problems in her son; Seizures in her maternal grandmother and son; Thyroid disease in her maternal grandmother.   Allergies No Known Allergies   Home Medications  Prior to Admission medications   Medication Sig Start Date End Date Taking? Authorizing Provider  acetaminophen (TYLENOL) 500 MG tablet Take 500-1,000 mg by mouth every 6 (six) hours as needed for mild pain or fever.    [provider]  ibuprofen (ADVIL) 200 MG tablet Take 400-800 mg by mouth every 6 (six) hours as needed for fever or mild pain.    [provider]  oxyCODONE (OXY IR/ROXICODONE) 5 MG immediate release tablet Take 5 mg by mouth every 4 (four) hours as needed for severe pain.    [provider]         Dolan Amen, MD IMTS, PGY-2 09/25/2019,7:39  AM

## 2019-09-25 NOTE — Progress Notes (Signed)
Initial Nutrition Assessment  DOCUMENTATION CODES:   Not applicable  INTERVENTION:   Tube feeding:  -Vital High Protein @ 45 ml/hr via OG (1080 ml) -90 ml ProSource TID  Provides: 1320 kcals (2080 kcal with propofol), 161 grams protein, 903 ml free water.    NUTRITION DIAGNOSIS:   Increased nutrient needs related to post-op healing as evidenced by estimated needs.  GOAL:   Patient will meet greater than or equal to 90% of their needs   MONITOR:   Vent status, Skin, TF tolerance, I & O's, Labs, Weight trends  REASON FOR ASSESSMENT:   Ventilator   ASSESSMENT:   Patient with PMH significant for HTN and 2 weeks post partum emergency C-section. Presents this admission with new onset peripartum cardiomyopathy with VF arrest.   On TTM36. Seizure activity noted overnight, neurological status continues to be assessed. On propofol. Will discuss feeding with CCM. OG confirmed in stomach.   Admission weight: 103.1 kg  Current weight: 104.5 kg   Patient is currently intubated on ventilator support MV: 12 L/min Temp (24hrs), Avg:98.8 F (37.1 C), Min:96.8 F (36 C), Max:99.7 F (37.6 C)  Propofol: 28.8 ml/hr- provides 760 kcal from lipids daily   I/O: -1,158 ml since admit  UOP: 2,905 ml x 24 hrs   Drips: propofol Medications: colace, SS novolog, miralax Labs: CBG 61-84  Diet Order:   Diet Order            Diet NPO time specified  Diet effective now                 EDUCATION NEEDS:   Not appropriate for education at this time  Skin:  Skin Assessment: Reviewed RN Assessment  Last BM:  8/18  Height:   Ht Readings from Last 1 Encounters:  09/24/19 5\' 10"  (1.778 m)    Weight:   Wt Readings from Last 1 Encounters:  09/25/19 104.5 kg    BMI:  Body mass index is 33.06 kg/m.  Estimated Nutritional Needs:   Kcal:  2034 kcal  Protein:  151-189 grams  Fluid:  >/= 2 L/day   20[address] RD, LDN Clinical Nutrition Pager listed in AMION

## 2019-09-25 NOTE — Progress Notes (Signed)
Progress Note  Patient Name: Beverly [name] Date of Encounter: 09/25/2019  [facility] Cardiologist: No primary care provider on file.   Subjective   Intubated, sedated.  Mother at bedside.  Events overnight noted.  Patient had some activity the may have been indicative of seizures overnight.  Her Keppra dose was increased.  Inpatient Medications    Scheduled Meds: . aspirin EC  81 mg Oral Daily  . chlorhexidine gluconate (MEDLINE KIT)  15 mL Mouth Rinse BID  . Chlorhexidine Gluconate Cloth  6 each Topical Daily  . docusate  100 mg Oral BID  . insulin aspart  0-15 Units Subcutaneous Q4H  . mouth rinse  15 mL Mouth Rinse 10 times per day  . pantoprazole (PROTONIX) IV  40 mg Intravenous Q24H  . polyethylene glycol  17 g Oral Daily  . sodium chloride flush  10-40 mL Intracatheter Q12H   Continuous Infusions: . sodium chloride 10 mL/hr at 09/25/19 0700  . cefTRIAXone (ROCEPHIN)  IV Stopped (09/24/19 2250)  . levETIRAcetam    . propofol (DIPRIVAN) infusion 45 mcg/kg/min (09/25/19 0743)   PRN Meds: acetaminophen, fentaNYL (SUBLIMAZE) injection, midazolam, sodium chloride flush   Vital Signs    Vitals:   09/25/19 0530 09/25/19 0600 09/25/19 0700 09/25/19 0818  BP: 112/74 107/70 125/86 115/83  Pulse: 99 (!) 101 (!) 111 (!) 106  Resp: _0 Temp: (!) 96.8 F (36 C) (!) 97.3 F (36.3 C) 99.1 F (37.3 C)   TempSrc:      SpO2: 100% 99% 96% 100%  Weight:      Height:        Intake/Output Summary (Last 24 hours) at 09/25/2019 0845 Last data filed at 09/25/2019 0700 Gross per 24 hour  Intake 1411.2 ml  Output 2840 ml  Net -1428.8 ml   Last 3 Weights 09/25/2019 09/24/2019 09/24/2019  Weight (lbs) 230 lb 6.1 oz 227 lb 4.7 oz 227 lb 4.7 oz  Weight (kg) 104.5 kg 103.1 kg 103.1 kg  Some encounter information is confidential and restricted. Go to Review Flowsheets activity to see all data.      Telemetry    Sinus rhythm without arrhythmia - Personally  Reviewed  ECG    Sinus tachycardia 108 bpm, age-indeterminate anterior/lateral infarct - Personally Reviewed  Physical Exam  Intubated, sedated GEN: No acute distress.   Neck: No JVD Cardiac:  Tachycardic and regular, no murmurs, rubs, or gallops.  Respiratory: Clear to auscultation bilaterally. GI: Soft, nontender, non-distended  MS: No edema; No deformity. Neuro:  Nonfocal  Psych: Unable to assess  Labs    High Sensitivity Troponin:   Recent Labs  Lab 09/23/19 1539 09/23/19 1816 09/24/19 0039 09/24/19 0826  TROPONINIHS 127* 2,078* 24,421* >27,000*      Chemistry Recent Labs  Lab 09/24/19 0039 09/24/19 0057 09/24/19 0826 09/24/19 1630 09/25/19 0325  NA 137  137   < > 138 138 136  K 2.7*  2.7*   < > 4.1 3.7 3.8  CL 101  102   < > 102 102 101  CO2 21*  21*   < > 21* 22 21*  GLUCOSE 115*  114*   < > 125* 90 83  BUN 8  8   < > _1 CREATININE 0.88  0.94   < > 0.75 0.74 0.74  CALCIUM 8.0*  8.0*   < > 8.5* 8.8* 8.9  PROT 6.9  --   --   --  6.9  ALBUMIN 2.7*  --   --   --  2.7*  AST 200*  --   --   --  151*  ALT 69*  --   --   --  64*  ALKPHOS 99  --   --   --  95  BILITOT 0.6  --   --   --  0.7  GFRNONAA >60  >60   < > >60 >60 >60  GFRAA >60  >60   < > >60 >60 >60  ANIONGAP 15  14   < > _0 < > = values in this interval not displayed.     Hematology Recent Labs  Lab 09/24/19 0039 09/24/19 0057 09/24/19 0338 09/24/19 0826 09/25/19 0325  WBC 11.6*  --   --  19.0* 12.5*  RBC 3.86*  --   --  3.80* 3.84*  HGB 9.8*   < > 10.2* 9.7* 10.0*  HCT 32.0*   < > 30.0* 32.0* 32.7*  MCV 82.9  --   --  84.2 85.2  MCH 25.4*  --   --  25.5* 26.0  MCHC 30.6  --   --  30.3 30.6  RDW 16.5*  --   --  16.8* 16.8*  PLT 672*  --   --  757* 697*   < > = values in this interval not displayed.    BNP Recent Labs  Lab 09/23/19 1539 09/24/19 0039  BNP 72.4 250.4*     DDimer No results for input(s): DDIMER in the last 168 hours.   Radiology     DG Chest 1 View  Result Date: 09/24/2019 CLINICAL DATA:  Post cardiac arrest. EXAM: CHEST  1 VIEW COMPARISON:  Radiograph and CT yesterday. FINDINGS: Endotracheal tube tip 2.3 cm from the carina. Enteric tube tip and side-port below the diaphragm not included in the field of view. Similar cardiomegaly, pericardial effusion on prior CT. Slight progression in diffuse bilateral airspace opacities. More confluent retrocardiac opacity. Small pleural effusions which were better seen on CT. No pneumothorax. IMPRESSION: 1. Endotracheal tube tip 2.3 cm from the carina. Enteric tube tip and side-port below the diaphragm not included in the field of view. 2. Slight progression in diffuse bilateral airspace opacities, pulmonary edema versus multifocal pneumonia. 3. Similar cardiomegaly, pericardial effusion on prior CT. Electronically Signed   By: Keith Rake M.D.   On: 09/24/2019 01:37   DG Chest 2 View  Result Date: 09/23/2019 CLINICAL DATA:  34 year old female with chest pain EXAM: CHEST - 2 VIEW COMPARISON:  None. FINDINGS: Small bilateral pleural effusions, right greater left. Minimal bibasilar atelectasis. Infiltrate is not excluded clinical correlation is recommended. No pneumothorax. There is mild cardiomegaly. No acute osseous pathology. IMPRESSION: 1. Small bilateral pleural effusions, right greater left. 2. Mild cardiomegaly. Electronically Signed   By: Anner Crete M.D.   On: 09/23/2019 16:19   DG Abdomen 1 View  Result Date: 09/23/2019 CLINICAL DATA:  Intubated, enteric catheter placement EXAM: ABDOMEN - 1 VIEW COMPARISON:  None. FINDINGS: Two frontal views of the lower chest and upper abdomen demonstrates enteric catheter tip projecting over gastric body. Defibrillator pad overlies the cardiac apex. Bowel gas pattern is unremarkable. IMPRESSION: 1. Enteric catheter overlying gastric body. Electronically Signed   By: Randa Ngo M.D.   On: 09/23/2019 18:43   CT Head Wo Contrast  Result  Date: 09/23/2019 CLINICAL DATA:  Cardiac arrest EXAM: CT HEAD WITHOUT CONTRAST TECHNIQUE: Contiguous axial images were obtained  from the base of the skull through the vertex without intravenous contrast. COMPARISON:  None. FINDINGS: Brain: There is no mass, hemorrhage or extra-axial collection. The size and configuration of the ventricles and extra-axial CSF spaces are normal. The brain parenchyma is normal, without acute or chronic infarction. Punctate colloid cyst at the level of the foramina of [place] is incidentally noted. Vascular: No abnormal hyperdensity of the major intracranial arteries or dural venous sinuses. No intracranial atherosclerosis. Skull: The visualized skull base, calvarium and extracranial soft tissues are normal. Sinuses/Orbits: No fluid levels or advanced mucosal thickening of the visualized paranasal sinuses. No mastoid or middle ear effusion. The orbits are normal. IMPRESSION: Normal head CT. Electronically Signed   By: Ulyses Jarred M.D.   On: 09/23/2019 20:43   CT Angio Chest PE W/Cm &/Or Wo Cm  Result Date: 09/23/2019 CLINICAL DATA:  Chest pain. COVID positive. EXAM: CT ANGIOGRAPHY CHEST WITH CONTRAST TECHNIQUE: Multidetector CT imaging of the chest was performed using the standard protocol during bolus administration of intravenous contrast. Multiplanar CT image reconstructions and MIPs were obtained to evaluate the vascular anatomy. CONTRAST:  197m OMNIPAQUE IOHEXOL 350 MG/ML SOLN COMPARISON:  None. FINDINGS: Cardiovascular: Satisfactory opacification of the pulmonary arteries to the segmental level. No evidence of pulmonary embolism. Normal heart size. A very small pericardial effusion is present. Mediastinum/Nodes: No enlarged mediastinal, hilar, or axillary lymph nodes. Endotracheal and nasogastric tubes are in place. Thyroid gland, trachea, and esophagus demonstrate no significant findings. Lungs/Pleura: Moderate severity infiltrates and/or atelectasis are seen within the  bilateral upper lobes, right greater than left, and posterior aspects of the bilateral lower lobes, left greater than right. Small bilateral pleural effusions are seen. No pneumothorax is identified. Upper Abdomen: A mild amount of fluid is suspected within the region in between the right kidney and liver. Musculoskeletal: No chest wall abnormality. No acute or significant osseous findings. Review of the MIP images confirms the above findings. IMPRESSION: 1. No CT evidence of acute pulmonary embolism. 2. Moderate severity infiltrates and/or atelectasis along the bilateral upper lobes, right greater than left, and posterior aspects of the bilateral lower lobes, left greater than right. 3. Small bilateral pleural effusions. 4. Very small pericardial effusion. 5. Mild amount of fluid suspected within the region in between the right kidney and liver. Electronically Signed   By: TVirgina NorfolkM.D.   On: 09/23/2019 20:43   CT Abdomen Pelvis W Contrast  Result Date: 09/23/2019 CLINICAL DATA:  Abdominal pain. EXAM: CT ABDOMEN AND PELVIS WITH CONTRAST TECHNIQUE: Multidetector CT imaging of the abdomen and pelvis was performed using the standard protocol following bolus administration of intravenous contrast. CONTRAST:  1047mOMNIPAQUE IOHEXOL 350 MG/ML SOLN COMPARISON:  None. FINDINGS: Lower chest: Moderate severity areas of atelectasis and/or infiltrate are seen within the posterior aspect of the bilateral lung bases, left greater than right. Small bilateral pleural effusions are seen. A small pericardial effusion is noted. Hepatobiliary: No focal liver abnormality is seen. No gallstones, gallbladder wall thickening, or biliary dilatation. Pancreas: Unremarkable. No pancreatic ductal dilatation or surrounding inflammatory changes. Spleen: Normal in size without focal abnormality. Adrenals/Urinary Tract: Adrenal glands are unremarkable. Kidneys are normal, without renal calculi, focal lesion, or hydronephrosis. A  Foley catheter is seen within the lumen of the urinary bladder. Stomach/Bowel: A nasogastric tube is seen with its distal tip noted within the body of the stomach. Stomach is within normal limits. Appendix appears normal. No evidence of bowel dilatation. Vascular/Lymphatic: No significant vascular findings are present. No enlarged  abdominal or pelvic lymph nodes. Reproductive: The uterus is enlarged and heterogeneous in appearance. Other: A C-section scar seen along the midline of the anterior pelvic wall. A small amount of fluid is seen in between the right kidney and right lobe of the liver, as well as within the posterior aspect of the pelvis. Musculoskeletal: No acute or significant osseous findings. IMPRESSION: 1. Moderate severity areas of atelectasis and/or infiltrate within the posterior aspect of the bilateral lung bases, left greater than right, with small bilateral pleural effusions. 2. Small pericardial effusion. 3. Enlarged, heterogeneous uterus, likely representing a fibroid uterus. 4. Small amount of fluid seen in between the right kidney and right lobe of the liver, as well as within the posterior aspect of the pelvis. Electronically Signed   By: Virgina Norfolk M.D.   On: 09/23/2019 20:48   CARDIAC CATHETERIZATION  Result Date: 09/23/2019 Conclusions: 1. No coronary artery occlusion or critical stenosis. 2. There is aneurysmal dilation of the ostial and proximal LAD with possible ulceration or dissection of uncertain chronicity.  TIMI-3 flow is noted throughout the LAD and its branches. 3. No angiographically significant disease involving the LCx or RCA. 4. Severely reduced left ventricular systolic function with mid/apical anterior, apical, and apical inferior hypokinesis/akinesis. 5. Severely elevated left ventricular filling pressure. Recommendations: 1. Continue indefinite aspirin 81 mg daily.  Defer P2Y12 inhibitor given recent acute blood loss anemia in the setting of c-section. 2. Defer  IV heparin given concern for possible dissection involving aneurysmal segment of proximal LAD. 3. Transfer to [facility] for continued management and surgical consultation/intervention if the patient were to become unstable with evidence of compromised flow in the LAD. 4. Continue amiodarone infusion. 5. Continue diuresis; patient received furosemide 40 mg IV x 1 at the end of catheterization. 6. Add evidence based medical therapy for heart failure as blood pressure tolerates and the patient is better compensated. Nelva Bush, MD [facility]   DG CHEST PORT 1 VIEW  Result Date: 09/24/2019 CLINICAL DATA:  Central line EXAM: PORTABLE CHEST 1 VIEW COMPARISON:  Earlier today FINDINGS: New left IJ line with tip at the upper right atrium. No pneumothorax or mediastinal widening. Enteric tube which reaches the stomach, which remains gas distended. Endotracheal tube tip is just below the clavicular heads. Cardiopericardial enlargement and bilateral airspace disease. IMPRESSION: 1. New central line with tip at the upper right atrium. No pneumothorax. 2. Unremarkable enteric tube positioning. The stomach remains gas distended. 3. Stable airspace disease and cardiac enlargement. Electronically Signed   By: Monte Fantasia M.D.   On: 09/24/2019 04:22   DG Chest Portable 1 View  Result Date: 09/23/2019 CLINICAL DATA:  Chest pain, intubated EXAM: PORTABLE CHEST 1 VIEW COMPARISON:  09/23/2019 FINDINGS: Single frontal view of the chest demonstrates endotracheal tube overlying tracheal air column tip 1.6 cm above carina. Enteric catheter passes below the diaphragm tip excluded by collimation. External defibrillator pads overlie the cardiac apex. The cardiac silhouette is enlarged but stable. Progressive central vascular congestion with small bilateral pleural effusions. No pneumothorax. IMPRESSION: 1. Support devices as above. 2. Progressive central vascular congestion with trace bilateral pleural effusion.  Electronically Signed   By: Randa Ngo M.D.   On: 09/23/2019 18:42   EEG adult  Result Date: 09/24/2019 [name], MD     09/24/2019  9:55 AM Patient Name: Beverly [name] MRN: [id number] Epilepsy Attending: [name] Referring Physician/Provider: Dr. Therese [name] Date: 09/24/2019 Duration: 25.44 mins Patient history: 34 year old female status  post cardiac arrest and seizure-like activity.  EEG to assess for seizures. Level of alertness: Comatose AEDs during EEG study: Keppra, propofol Technical aspects: This EEG study was done with scalp electrodes positioned according to the 10-20 International system of electrode placement. Electrical activity was acquired at a sampling rate of _0  and reviewed with a high frequency filter of _1  and a low frequency filter of _2 . EEG data were recorded continuously and digitally stored. Description: EEG showed continuous generalized 3 to 5 Hz theta-delta slowing which at times appeared sharply contoured.  EEG was reactive to tactile stimulation.  Hyperventilation and photic stimulation were not performed.   ABNORMALITY -Continuous slow, generalized IMPRESSION: This study is suggestive of severe diffuse encephalopathy, nonspecific to etiology.  No seizures or definite epileptiform discharges were seen throughout the recording. [name]   ECHOCARDIOGRAM COMPLETE  Result Date: 09/24/2019    ECHOCARDIOGRAM REPORT   Patient Name:   Beverly [name] Date of Exam: 09/24/2019 Medical Rec #:  [id number]        Height:       70.0 in Accession #:    [id number]       Weight:       227.3 lb Date of Birth:  01-12-1986         BSA:          2.204 m Patient Age:    34 years         BP:           125/83 mmHg Patient Gender: F                HR:           108 bpm. Exam Location:  Inpatient Procedure: 2D Echo Indications:    cardiac arrest  History:        Patient has no prior history of Echocardiogram examinations.                 Arrythmias:V-Tach.   Sonographer:    Johny Chess Referring Phys: [id number] [place] A Gas[place] S[place]  Sonographer Comments: Echo performed with patient supine and on artificial respirator. Image acquisition challenging due to respiratory motion and Image acquisition challenging due to patient body habitus. IMPRESSIONS  1. Septal and apical akinesis Distal anterior wall and mid/distal inferior wall hypokinesis Hyperdynamic basal function . Left ventricular ejection fraction, by estimation, is 30 to 35%. The left ventricle has moderately decreased function. The left ventricle demonstrates regional wall motion abnormalities (see scoring diagram/findings for description). The left ventricular internal cavity size was mildly dilated. Left ventricular diastolic parameters are indeterminate.  2. Right ventricular systolic function is normal. The right ventricular size is normal.  3. The mitral valve is normal in structure. Trivial mitral valve regurgitation. No evidence of mitral stenosis.  4. The aortic valve was not well visualized. Aortic valve regurgitation is not visualized. No aortic stenosis is present.  5. The inferior vena cava is normal in size with greater than 50% respiratory variability, suggesting right atrial pressure of 3 mmHg. FINDINGS  Left Ventricle: Septal and apical akinesis Distal anterior wall and mid/distal inferior wall hypokinesis Hyperdynamic basal function. Left ventricular ejection fraction, by estimation, is 30 to 35%. The left ventricle has moderately decreased function. The left ventricle demonstrates regional wall motion abnormalities. The left ventricular internal cavity size was mildly dilated. There is no left ventricular hypertrophy. Left ventricular diastolic parameters are indeterminate. Right Ventricle: The right ventricular size is normal. No increase in right ventricular wall  thickness. Right ventricular systolic function is normal. Left Atrium: Left atrial size was normal in size. Right Atrium: Right  atrial size was normal in size. Pericardium: Trivial pericardial effusion is present. The pericardial effusion is lateral to the left ventricle. Mitral Valve: The mitral valve is normal in structure. Normal mobility of the mitral valve leaflets. Trivial mitral valve regurgitation. No evidence of mitral valve stenosis. Tricuspid Valve: The tricuspid valve is normal in structure. Tricuspid valve regurgitation is not demonstrated. No evidence of tricuspid stenosis. Aortic Valve: The aortic valve was not well visualized. Aortic valve regurgitation is not visualized. No aortic stenosis is present. Pulmonic Valve: The pulmonic valve was normal in structure. Pulmonic valve regurgitation is not visualized. No evidence of pulmonic stenosis. Aorta: The aortic root is normal in size and structure. Venous: The inferior vena cava is normal in size with greater than 50% respiratory variability, suggesting right atrial pressure of 3 mmHg. IAS/Shunts: No atrial level shunt detected by color flow Doppler.  LEFT VENTRICLE PLAX 2D LVIDd:         5.10 cm LVIDs:         3.70 cm LV PW:         1.00 cm LV IVS:        1.10 cm LVOT diam:     2.10 cm LV SV:         56 LV SV Index:   26 LVOT Area:     3.46 cm  LV Volumes (MOD) LV vol d, MOD A4C: 112.0 ml LV vol s, MOD A4C: 73.3 ml LV SV MOD A4C:     112.0 ml RIGHT VENTRICLE RV S prime:     13.30 cm/s TAPSE (M-mode): 2.2 cm LEFT ATRIUM             Index       RIGHT ATRIUM           Index LA diam:        3.10 cm 1.41 cm/m  RA Area:     14.70 cm LA Vol (A2C):   64.1 ml 29.08 ml/m RA Volume:   38.80 ml  17.60 ml/m LA Vol (A4C):   70.3 ml 31.90 ml/m LA Biplane Vol: 74.0 ml 33.58 ml/m  AORTIC VALVE LVOT Vmax:   93.30 cm/s LVOT Vmean:  66.000 cm/s LVOT VTI:    0.163 m  AORTA Ao Root diam: 3.30 cm Ao Asc diam:  2.80 cm  SHUNTS Systemic VTI:  0.16 m Systemic Diam: 2.10 cm Jenkins Rouge MD Electronically signed by Jenkins Rouge MD Signature Date/Time: 09/24/2019/4:08:10 PM    Final     Cardiac  Studies   2D Echo: IMPRESSIONS    1. Septal and apical akinesis Distal anterior wall and mid/distal  inferior wall hypokinesis Hyperdynamic basal function . Left ventricular  ejection fraction, by estimation, is 30 to 35%. The left ventricle has  moderately decreased function. The left  ventricle demonstrates regional wall motion abnormalities (see scoring  diagram/findings for description). The left ventricular internal cavity  size was mildly dilated. Left ventricular diastolic parameters are  indeterminate.  2. Right ventricular systolic function is normal. The right ventricular  size is normal.  3. The mitral valve is normal in structure. Trivial mitral valve  regurgitation. No evidence of mitral stenosis.  4. The aortic valve was not well visualized. Aortic valve regurgitation  is not visualized. No aortic stenosis is present.  5. The inferior vena cava is normal in size with greater than 50%  respiratory variability, suggesting right atrial pressure of 3 mmHg.  Patient Profile     34 y.o. female with VF arrest and anterior MI  Assessment & Plan    1.  VF arrest/torsades: Suspect ischemia driven.  Reviewed cardiac catheterization films.  At the time of heart catheterization following her arrest, the patient was found to have aneurysmal dilatation of the proximal LAD with a filling defect suggestive of dissection.  Clinical scenario in a young woman 2 weeks postpartum puts her at high risk of spontaneous coronary artery dissection.  Agree with current supportive measures.  The patient is treated with aspirin.  Probably best to avoid heparin at this point unless recurrent ischemia is demonstrated.  She has had a large infarct with troponin greater than 27,000. 2D echo consistent with anterior infarct, LVEF 35%.   amiodarone stopped yesterday because of QT prolongation - QT normalized today. No further arrhythmia. 2.  Acute encephalopathy: TTM management per the critical care  team. Seizure activity noted overnight. Neuro team following. Treated with Keppra.  3.  Acute systolic heart failure: Diuresed with IV furosemide as tolerated. LVEF 35%. Received IV lasix x 2 yesterday with good urine output, creatinine and electrolytes ok this am. Continue to supplement K (3.8 this am). Start carvedilol 3.125 mg BID. 4.  QT prolongation: Suspect combination of amiodarone, azithromycin, and hypothermia.  improved today after stopping IV amiodarone. azithro stopped yesterday as well.   Plan discussed with the patient's mother.  She will start rewarming later today.  Clinical follow-up of her neurologic status.  Stable from cardiac perspective this morning.  As she is oxygenating well today, will not give IV furosemide this morning.  The patient is critically ill with multiple organ systems failure and requires high complexity decision making for assessment and support, frequent evaluation and titration of therapies, application of advanced monitoring technologies and extensive interpretation of multiple databases.   Critical Care Time devoted to patient care services described in this note is 35 minutes.  For questions or updates, please contact [place] Please consult [email or web address] for contact info under     Signed, Sherren Mocha, MD  09/25/2019, 8:45 AM

## 2019-09-25 NOTE — Progress Notes (Signed)
Spoke to Dr. Warrick Parisian about leaving pt on CPAP/PS over night.  Pt is pulling Vt of 700-800 on PS is more synchronous with the vent and looks more comfortable. Will continue to monitor pt closely for any changes and full vent support if needed.

## 2019-09-25 NOTE — Discharge Summary (Signed)
Discharge Summary    Patient ID: Beverly [name] MRN: [id number]; DOB: 1985/06/04  Admit date: 09/23/2019 Discharge date: 09/25/2019  Primary Care Provider: Patient, No Pcp Per  Primary Cardiologist: New - Shelton Soler Primary Electrophysiologist:  None   Discharge Diagnoses    Active Problems:   Cardiac arrest [facility])    Diagnostic Studies/Procedures    LHC (09/23/19): 1. No coronary artery occlusion or critical stenosis. 2. There is aneurysmal dilation of the ostial and proximal LAD with possible ulceration or dissection of uncertain chronicity.  TIMI-3 flow is noted throughout the LAD and its branches. 3. No angiographically significant disease involving the LCx or RCA. 4. Severely reduced left ventricular systolic function with mid/apical anterior, apical, and apical inferior hypokinesis/akinesis. 5. Severely elevated left ventricular filling pressure.  _____________   History of Present Illness     Beverly [name] is a 34 y.o. female with gestational hypertension, who initially presented to the emergency department with intermittent chest pain 2 weeks status post emergency C-section.  In the emergency department, she had witnessed cardiac arrest with polymorphic ventricular tachycardia degenerating quickly to ventricular fibrillation.  Hospital Course     Consultants: Critical care medicine  Following VF arrest, the patient was defibrillated multiple times with ultimate return of ROSC after about 5 minutes.  She was intubated and sedated.  Initial post ROSC EKG showed tachycardia with widespread ST elevation most pronounced in the anterior leads.  Repeat EKG shortly thereafter showed resolution of ST elevation.  However, given VT/VF arrest, dynamic EKG changes, and presenting chest pain 2 weeks postpartum, the patient was taken for urgent cardiac catheterization.  This revealed aneurysmal dilation of the ostial/proximal LAD with concern for possible dissection within the aneurysmal  segment.  The patient was hemodynamically stable without further ventricular tachycardia on amiodarone infusion.  Decision was made to transfer her urgently to [facility] for continued postarrest management and to have cardiac surgery available for emergent CABG were her proximal LAD to reocclude.  Did the patient have an acute coronary syndrome (MI, NSTEMI, STEMI, etc) this admission?:  Yes                               AHA/ACC Clinical Performance & Quality Measures: 1. Aspirin prescribed? - Yes 2. ADP Receptor Inhibitor (Plavix/Clopidogrel, Brilinta/Ticagrelor or Effient/Prasugrel) prescribed (includes medically managed patients)? - No - Concern for spontaneous coronary artery dissection and anemia recently postpartum 3. Beta Blocker prescribed? - No - Resolving shock 4. High Intensity Statin (Lipitor 40-80mg  or Crestor 20-40mg ) prescribed? - No - Patient acutely ill, intubated and undergoing targeted temperature management. 5. EF assessed during THIS hospitalization? - Yes 6. For EF <40%, was ACEI/ARB prescribed? - No - Reason:  Acute decompensated heart failure. 7. For EF <40%, Aldosterone Antagonist (Spironolactone or Eplerenone) prescribed? - No - Reason:  Acute decompensated heart failure with recent cardiac arrest.  Ongoing work-up prior to initiation of aldosterone antagonist. 8. Cardiac Rehab Phase II ordered (including medically managed patients)? - No - Patient critically ill and requiring transfer to higher level of care.   _____________  Discharge Vitals Blood pressure (!) 140/93, pulse (!) 117, temperature 98.9 F (37.2 C), temperature source Oral, resp. rate 20, height  (1.778 m), weight 106.6 kg, SpO2 99 %, unknown if currently breastfeeding.  Filed Weights   09/23/19 1551  Weight: 106.6 kg    Labs & Radiologic Studies    CBC Recent Labs  09/24/19 0826 09/25/19 0325  WBC 19.0* 12.5*  HGB 9.7* 10.0*  HCT 32.0* 32.7*  MCV 84.2 85.2  PLT 757* 697*    Basic Metabolic Panel Recent Labs    16/10/96 0826 09/24/19 0826 09/24/19 1630 09/25/19 0325  NA 138   < > 138 136  K 4.1   < > 3.7 3.8  CL 102   < > 102 101  CO2 21*   < > 22 21*  GLUCOSE 125*   < > 90 83  BUN 7   < > 8 10  CREATININE 0.75   < > 0.74 0.74  CALCIUM 8.5*   < > 8.8* 8.9  MG 2.0  --   --  1.8  PHOS 4.5  --   --  4.0   < > = values in this interval not displayed.   Liver Function Tests Recent Labs    09/24/19 0039 09/25/19 0325  AST 200* 151*  ALT 69* 64*  ALKPHOS 99 95  BILITOT 0.6 0.7  PROT 6.9 6.9  ALBUMIN 2.7* 2.7*   No results for input(s): LIPASE, AMYLASE in the last 72 hours. High Sensitivity Troponin:   Recent Labs  Lab 09/23/19 1539 09/23/19 1816 09/24/19 0039 09/24/19 0826  TROPONINIHS 127* 2,078* 24,421* >27,000*    BNP Invalid input(s): POCBNP D-Dimer No results for input(s): DDIMER in the last 72 hours. Hemoglobin A1C Recent Labs    09/24/19 0826  HGBA1C 5.7*   Fasting Lipid Panel Recent Labs    09/25/19 0325  TRIG 155*   Thyroid Function Tests Recent Labs    09/24/19 0039  TSH 0.511   _____________  DG Chest 1 View  Result Date: 09/24/2019 CLINICAL DATA:  Post cardiac arrest. EXAM: CHEST  1 VIEW COMPARISON:  Radiograph and CT yesterday. FINDINGS: Endotracheal tube tip 2.3 cm from the carina. Enteric tube tip and side-port below the diaphragm not included in the field of view. Similar cardiomegaly, pericardial effusion on prior CT. Slight progression in diffuse bilateral airspace opacities. More confluent retrocardiac opacity. Small pleural effusions which were better seen on CT. No pneumothorax. IMPRESSION: 1. Endotracheal tube tip 2.3 cm from the carina. Enteric tube tip and side-port below the diaphragm not included in the field of view. 2. Slight progression in diffuse bilateral airspace opacities, pulmonary edema versus multifocal pneumonia. 3. Similar cardiomegaly, pericardial effusion on prior CT. Electronically  Signed   By: Narda Rutherford M.D.   On: 09/24/2019 01:37   DG Chest 2 View  Result Date: 09/23/2019 CLINICAL DATA:  35 year old female with chest pain EXAM: CHEST - 2 VIEW COMPARISON:  None. FINDINGS: Small bilateral pleural effusions, right greater left. Minimal bibasilar atelectasis. Infiltrate is not excluded clinical correlation is recommended. No pneumothorax. There is mild cardiomegaly. No acute osseous pathology. IMPRESSION: 1. Small bilateral pleural effusions, right greater left. 2. Mild cardiomegaly. Electronically Signed   By: Elgie Collard M.D.   On: 09/23/2019 16:19   DG Abdomen 1 View  Result Date: 09/23/2019 CLINICAL DATA:  Intubated, enteric catheter placement EXAM: ABDOMEN - 1 VIEW COMPARISON:  None. FINDINGS: Two frontal views of the lower chest and upper abdomen demonstrates enteric catheter tip projecting over gastric body. Defibrillator pad overlies the cardiac apex. Bowel gas pattern is unremarkable. IMPRESSION: 1. Enteric catheter overlying gastric body. Electronically Signed   By: Sharlet Salina M.D.   On: 09/23/2019 18:43   CT Head Wo Contrast  Result Date: 09/23/2019 CLINICAL DATA:  Cardiac arrest EXAM: CT HEAD WITHOUT CONTRAST  TECHNIQUE: Contiguous axial images were obtained from the base of the skull through the vertex without intravenous contrast. COMPARISON:  None. FINDINGS: Brain: There is no mass, hemorrhage or extra-axial collection. The size and configuration of the ventricles and extra-axial CSF spaces are normal. The brain parenchyma is normal, without acute or chronic infarction. Punctate colloid cyst at the level of the foramina of [place] is incidentally noted. Vascular: No abnormal hyperdensity of the major intracranial arteries or dural venous sinuses. No intracranial atherosclerosis. Skull: The visualized skull base, calvarium and extracranial soft tissues are normal. Sinuses/Orbits: No fluid levels or advanced mucosal thickening of the visualized paranasal  sinuses. No mastoid or middle ear effusion. The orbits are normal. IMPRESSION: Normal head CT. Electronically Signed   By: Deatra Robinson M.D.   On: 09/23/2019 20:43   CT Angio Chest PE W/Cm &/Or Wo Cm  Result Date: 09/23/2019 CLINICAL DATA:  Chest pain. COVID positive. EXAM: CT ANGIOGRAPHY CHEST WITH CONTRAST TECHNIQUE: Multidetector CT imaging of the chest was performed using the standard protocol during bolus administration of intravenous contrast. Multiplanar CT image reconstructions and MIPs were obtained to evaluate the vascular anatomy. CONTRAST:  OMNIPAQUE IOHEXOL 350 MG/ML SOLN COMPARISON:  None. FINDINGS: Cardiovascular: Satisfactory opacification of the pulmonary arteries to the segmental level. No evidence of pulmonary embolism. Normal heart size. A very small pericardial effusion is present. Mediastinum/Nodes: No enlarged mediastinal, hilar, or axillary lymph nodes. Endotracheal and nasogastric tubes are in place. Thyroid gland, trachea, and esophagus demonstrate no significant findings. Lungs/Pleura: Moderate severity infiltrates and/or atelectasis are seen within the bilateral upper lobes, right greater than left, and posterior aspects of the bilateral lower lobes, left greater than right. Small bilateral pleural effusions are seen. No pneumothorax is identified. Upper Abdomen: A mild amount of fluid is suspected within the region in between the right kidney and liver. Musculoskeletal: No chest wall abnormality. No acute or significant osseous findings. Review of the MIP images confirms the above findings. IMPRESSION: 1. No CT evidence of acute pulmonary embolism. 2. Moderate severity infiltrates and/or atelectasis along the bilateral upper lobes, right greater than left, and posterior aspects of the bilateral lower lobes, left greater than right. 3. Small bilateral pleural effusions. 4. Very small pericardial effusion. 5. Mild amount of fluid suspected within the region in between the right  kidney and liver. Electronically Signed   By: Aram Candela M.D.   On: 09/23/2019 20:43   CT Abdomen Pelvis W Contrast  Result Date: 09/23/2019 CLINICAL DATA:  Abdominal pain. EXAM: CT ABDOMEN AND PELVIS WITH CONTRAST TECHNIQUE: Multidetector CT imaging of the abdomen and pelvis was performed using the standard protocol following bolus administration of intravenous contrast. CONTRAST:  OMNIPAQUE IOHEXOL 350 MG/ML SOLN COMPARISON:  None. FINDINGS: Lower chest: Moderate severity areas of atelectasis and/or infiltrate are seen within the posterior aspect of the bilateral lung bases, left greater than right. Small bilateral pleural effusions are seen. A small pericardial effusion is noted. Hepatobiliary: No focal liver abnormality is seen. No gallstones, gallbladder wall thickening, or biliary dilatation. Pancreas: Unremarkable. No pancreatic ductal dilatation or surrounding inflammatory changes. Spleen: Normal in size without focal abnormality. Adrenals/Urinary Tract: Adrenal glands are unremarkable. Kidneys are normal, without renal calculi, focal lesion, or hydronephrosis. A Foley catheter is seen within the lumen of the urinary bladder. Stomach/Bowel: A nasogastric tube is seen with its distal tip noted within the body of the stomach. Stomach is within normal limits. Appendix appears normal. No evidence of bowel dilatation. Vascular/Lymphatic: No significant  vascular findings are present. No enlarged abdominal or pelvic lymph nodes. Reproductive: The uterus is enlarged and heterogeneous in appearance. Other: A C-section scar seen along the midline of the anterior pelvic wall. A small amount of fluid is seen in between the right kidney and right lobe of the liver, as well as within the posterior aspect of the pelvis. Musculoskeletal: No acute or significant osseous findings. IMPRESSION: 1. Moderate severity areas of atelectasis and/or infiltrate within the posterior aspect of the bilateral lung bases,  left greater than right, with small bilateral pleural effusions. 2. Small pericardial effusion. 3. Enlarged, heterogeneous uterus, likely representing a fibroid uterus. 4. Small amount of fluid seen in between the right kidney and right lobe of the liver, as well as within the posterior aspect of the pelvis. Electronically Signed   By: Aram Candelahaddeus  Houston M.D.   On: 09/23/2019 20:48   CARDIAC CATHETERIZATION  Result Date: 09/23/2019 Conclusions: 1. No coronary artery occlusion or critical stenosis. 2. There is aneurysmal dilation of the ostial and proximal LAD with possible ulceration or dissection of uncertain chronicity.  TIMI-3 flow is noted throughout the LAD and its branches. 3. No angiographically significant disease involving the LCx or RCA. 4. Severely reduced left ventricular systolic function with mid/apical anterior, apical, and apical inferior hypokinesis/akinesis. 5. Severely elevated left ventricular filling pressure. Recommendations: 1. Continue indefinite aspirin 81 mg daily.  Defer P2Y12 inhibitor given recent acute blood loss anemia in the setting of c-section. 2. Defer IV heparin given concern for possible dissection involving aneurysmal segment of proximal LAD. 3. [facility] and surgical consultation/intervention if the patient were to become unstable with evidence of compromised flow in the LAD. 4. Continue amiodarone infusion. 5. Continue diuresis; patient received furosemide 40 mg IV x 1 at the [place] [facility] of catheterization. 6. Add evidence based medical therapy for heart failure as blood pressure tolerates and the patient is better compensated. Yvonne Kendallhristopher Keshana Klemz, MD [facility]   DG CHEST PORT 1 VIEW  Result Date: 09/24/2019 CLINICAL DATA:  Central line EXAM: PORTABLE CHEST 1 VIEW COMPARISON:  Earlier today FINDINGS: New left IJ line with tip at the upper right atrium. No pneumothorax or mediastinal widening. Enteric tube which reaches the stomach, which  remains gas distended. Endotracheal tube tip is just below the clavicular heads. Cardiopericardial enlargement and bilateral airspace disease. IMPRESSION: 1. New central line with tip at the upper right atrium. No pneumothorax. 2. Unremarkable enteric tube positioning. The stomach remains gas distended. 3. Stable airspace disease and cardiac enlargement. Electronically Signed   By: Marnee SpringJonathon  Watts M.D.   On: 09/24/2019 04:22   DG Chest Portable 1 View  Result Date: 09/23/2019 CLINICAL DATA:  Chest pain, intubated EXAM: PORTABLE CHEST 1 VIEW COMPARISON:  09/23/2019 FINDINGS: Single frontal view of the chest demonstrates endotracheal tube overlying tracheal air column tip 1.6 cm above carina. Enteric catheter passes below the diaphragm tip excluded by collimation. External defibrillator pads overlie the cardiac apex. The cardiac silhouette is enlarged but stable. Progressive central vascular congestion with small bilateral pleural effusions. No pneumothorax. IMPRESSION: 1. Support devices as above. 2. Progressive central vascular congestion with trace bilateral pleural effusion. Electronically Signed   By: Sharlet SalinaMichael  Brown M.D.   On: 09/23/2019 18:42   EEG adult  Result Date: 09/24/2019 Charlsie QuestYadav, Priyanka O, MD     09/24/2019  9:55 AM Patient Name: Ventura BrunsShatoya A Rowlands MRN: [id number] Epilepsy Attending: Charlsie QuestPriyanka O Yadav Referring Physician/Provider: Dr. Lindie SpruceMahesh Chandrashaker Date: 09/24/2019 Duration: 25.44  mins Patient history: 34 year old female status post cardiac arrest and seizure-like activity.  EEG to assess for seizures. Level of alertness: Comatose AEDs during EEG study: Keppra, propofol Technical aspects: This EEG study was done with scalp electrodes positioned according to the 10-20 International system of electrode placement. Electrical activity was acquired at a sampling rate of 500Hz  and reviewed with a high frequency filter of 70Hz  and a low frequency filter of 1Hz . EEG data were recorded continuously and  digitally stored. Description: EEG showed continuous generalized 3 to 5 Hz theta-delta slowing which at times appeared sharply contoured.  EEG was reactive to tactile stimulation.  Hyperventilation and photic stimulation were not performed.   ABNORMALITY -Continuous slow, generalized IMPRESSION: This study is suggestive of severe diffuse encephalopathy, nonspecific to etiology.  No seizures or definite epileptiform discharges were seen throughout the recording. Priyanka   Overnight EEG with video  Result Date: 09/25/2019 , MD     09/25/2019  9:43 AM Patient Name: MICHAELLA IMAI MRN: Charlsie Quest Epilepsy Attending: 09/27/2019 Referring Physician/Provider: Dr. [name] Duration: 09/24/2019 0950 to 09/25/2019 0930  Patient history: 34 year old female status post cardiac arrest and seizure-like activity.  EEG to assess for seizures.  Level of alertness: Comatose  AEDs during EEG study: Keppra, propofol  Technical aspects: This EEG study was done with scalp electrodes positioned according to the 10-20 International system of electrode placement. Electrical activity was acquired at a sampling rate of 500Hz  and reviewed with a high frequency filter of 70Hz  and a low frequency filter of 1Hz . EEG data were recorded continuously and digitally stored.  Description: EEG showed continuous generalized 3 to 5 Hz theta-delta slowing which at times appeared sharply contoured. EEG was reactive to tactile stimulation.  Hyperventilation and photic stimulation were not performed.  Event button was pressed on 09/25/2019 at 0339 for unclear reasons. Concomitant eeg before, during and after the event didn't show any eeg change to suggest seizure.  ABNORMALITY -Continuous slow, generalized  IMPRESSION: This study is suggestive of severe diffuse encephalopathy, nonspecific to etiology.  No seizures or definite epileptiform discharges were seen throughout the recording.   09/27/2019    ECHOCARDIOGRAM COMPLETE  Result Date: 09/24/2019    ECHOCARDIOGRAM REPORT   Patient Name:   COLANDRA OHANIAN Date of Exam: 09/24/2019 Medical Rec #:         Height:       70.0 in Accession #:    09/27/2019       Weight:       227.3 lb Date of Birth:  06/16/1985         BSA:          2.204 m Patient Age:    34 years         BP:           125/83 mmHg Patient Gender: F                HR:           108 bpm. Exam Location:  Inpatient Procedure: 2D Echo Indications:    cardiac arrest  History:        Patient has no prior history of Echocardiogram examinations.                 Arrythmias:V-Tach.  Sonographer:    [name] Referring Phys: 09/26/2019 MAHESH A [id number]  Sonographer Comments: Echo performed with patient supine and on artificial respirator. Image acquisition  challenging due to respiratory motion and Image acquisition challenging due to patient body habitus. IMPRESSIONS  1. Septal and apical akinesis Distal anterior wall and mid/distal inferior wall hypokinesis Hyperdynamic basal function . Left ventricular ejection fraction, by estimation, is 30 to 35%. The left ventricle has moderately decreased function. The left ventricle demonstrates regional wall motion abnormalities (see scoring diagram/findings for description). The left ventricular internal cavity size was mildly dilated. Left ventricular diastolic parameters are indeterminate.  2. Right ventricular systolic function is normal. The right ventricular size is normal.  3. The mitral valve is normal in structure. Trivial mitral valve regurgitation. No evidence of mitral stenosis.  4. The aortic valve was not well visualized. Aortic valve regurgitation is not visualized. No aortic stenosis is present.  5. The inferior vena cava is normal in size with greater than 50% respiratory variability, suggesting right atrial pressure of 3 mmHg. FINDINGS  Left Ventricle: Septal and apical akinesis Distal anterior wall and mid/distal inferior  wall hypokinesis Hyperdynamic basal function. Left ventricular ejection fraction, by estimation, is 30 to 35%. The left ventricle has moderately decreased function. The left ventricle demonstrates regional wall motion abnormalities. The left ventricular internal cavity size was mildly dilated. There is no left ventricular hypertrophy. Left ventricular diastolic parameters are indeterminate. Right Ventricle: The right ventricular size is normal. No increase in right ventricular wall thickness. Right ventricular systolic function is normal. Left Atrium: Left atrial size was normal in size. Right Atrium: Right atrial size was normal in size. Pericardium: Trivial pericardial effusion is present. The pericardial effusion is lateral to the left ventricle. Mitral Valve: The mitral valve is normal in structure. Normal mobility of the mitral valve leaflets. Trivial mitral valve regurgitation. No evidence of mitral valve stenosis. Tricuspid Valve: The tricuspid valve is normal in structure. Tricuspid valve regurgitation is not demonstrated. No evidence of tricuspid stenosis. Aortic Valve: The aortic valve was not well visualized. Aortic valve regurgitation is not visualized. No aortic stenosis is present. Pulmonic Valve: The pulmonic valve was normal in structure. Pulmonic valve regurgitation is not visualized. No evidence of pulmonic stenosis. Aorta: The aortic root is normal in size and structure. Venous: The inferior vena cava is normal in size with greater than 50% respiratory variability, suggesting right atrial pressure of 3 mmHg. IAS/Shunts: No atrial level shunt detected by color flow Doppler.  LEFT VENTRICLE PLAX 2D LVIDd:         5.10 cm LVIDs:         3.70 cm LV PW:         1.00 cm LV IVS:        1.10 cm LVOT diam:     2.10 cm LV SV:         56 LV SV Index:   26 LVOT Area:     3.46 cm  LV Volumes (MOD) LV vol d, MOD A4C: 112.0 ml LV vol s, MOD A4C: 73.3 ml LV SV MOD A4C:     112.0 ml RIGHT VENTRICLE RV S prime:      13.30 cm/s TAPSE (M-mode): 2.2 cm LEFT ATRIUM             Index       RIGHT ATRIUM           Index LA diam:        3.10 cm 1.41 cm/m  RA Area:     14.70 cm LA Vol (A2C):   64.1 ml 29.08 ml/m RA Volume:   38.80 ml  17.60 ml/m LA Vol (A4C):   70.3 ml 31.90 ml/m LA Biplane Vol: 74.0 ml 33.58 ml/m  AORTIC VALVE LVOT Vmax:   93.30 cm/s LVOT Vmean:  66.000 cm/s LVOT VTI:    0.163 m  AORTA Ao Root diam: 3.30 cm Ao Asc diam:  2.80 cm  SHUNTS Systemic VTI:  0.16 m Systemic Diam: 2.10 cm Charlton Haws MD Electronically signed by Charlton Haws MD Signature Date/Time: 09/24/2019/4:08:10 PM    Final    Disposition   Pt is being discharged home today in good condition.  Follow-up Plans & Appointments       Discharge Medications   Continue amiodarone infusion, aspirin, and propofol pending transfer to [facility].   Outstanding Labs/Studies   Transthoracic echocardiogram ordered, to be completed at [facility].  Duration of Discharge Encounter   Greater than 30 minutes including physician time.  Signed, Yvonne Kendall, MD 09/25/2019, 1:46 PM

## 2019-09-25 NOTE — Progress Notes (Signed)
eLink Physician-Brief Progress Note Patient Name: Beverly [name] DOB: 16-Dec-1985 MRN: [id number]   Date of Service  09/25/2019  HPI/Events of Note  Patient comfortable on pressure support and dyssynchronous on full ventilator support, RT asking if patient  Can be left on pressure support.  eICU Interventions  RT instructed to leave patient on PS and only rest on the ventilator for development of respiratory fatigue.        Thomasene Lot Maryssa Giampietro 09/25/2019, 7:55 PM

## 2019-09-25 NOTE — Progress Notes (Signed)
Chaplain engaged in initial visit with Beverly [name] and her mother Beverly [name].  Chaplain offered support and will continue to follow-up.

## 2019-09-26 LAB — BASIC METABOLIC PANEL
Anion gap: 9 (ref 5–15)
BUN: 12 mg/dL (ref 6–20)
CO2: 24 mmol/L (ref 22–32)
Calcium: 8.6 mg/dL — ABNORMAL LOW (ref 8.9–10.3)
Chloride: 104 mmol/L (ref 98–111)
Creatinine, Ser: 0.59 mg/dL (ref 0.44–1.00)
GFR calc Af Amer: >60 mL/min (ref >60)
GFR calc non Af Amer: >60 mL/min (ref >60)
Glucose, Bld: 94 mg/dL (ref 70–99)
Potassium: 3.7 mmol/L (ref 3.5–5.1)
Sodium: 137 mmol/L (ref 135–145)

## 2019-09-26 LAB — CBC
HCT: 32.7 % — ABNORMAL LOW (ref 36.0–46.0)
Hemoglobin: 9.9 g/dL — ABNORMAL LOW (ref 12.0–15.0)
MCH: 26.2 pg (ref 26.0–34.0)
MCHC: 30.3 g/dL (ref 30.0–36.0)
MCV: 86.5 fL (ref 80.0–100.0)
Platelets: 726 10*3/uL — ABNORMAL HIGH (ref 150–400)
RBC: 3.78 MIL/uL — ABNORMAL LOW (ref 3.87–5.11)
RDW: 16.9 % — ABNORMAL HIGH (ref 11.5–15.5)
WBC: 7.8 10*3/uL (ref 4.0–10.5)
nRBC: 0 % (ref 0.0–0.2)

## 2019-09-26 LAB — GLUCOSE, CAPILLARY
Glucose-Capillary: 74 mg/dL (ref 70–99)
Glucose-Capillary: 78 mg/dL (ref 70–99)
Glucose-Capillary: 83 mg/dL (ref 70–99)
Glucose-Capillary: 84 mg/dL (ref 70–99)
Glucose-Capillary: 87 mg/dL (ref 70–99)
Glucose-Capillary: 95 mg/dL (ref 70–99)

## 2019-09-26 LAB — MAGNESIUM: Magnesium: 2.1 mg/dL (ref 1.7–2.4)

## 2019-09-26 LAB — LIPID PANEL
Cholesterol: 220 mg/dL — ABNORMAL HIGH (ref 0–200)
HDL: 48 mg/dL (ref >40)
LDL Cholesterol: 122 mg/dL — ABNORMAL HIGH (ref 0–99)
Total CHOL/HDL Ratio: 4.6 RATIO
Triglycerides: 251 mg/dL — ABNORMAL HIGH (ref <150)
VLDL: 50 mg/dL — ABNORMAL HIGH (ref 0–40)

## 2019-09-26 LAB — TRIGLYCERIDES: Triglycerides: 248 mg/dL — ABNORMAL HIGH (ref <150)

## 2019-09-26 MED ORDER — ENOXAPARIN SODIUM 40 MG/0.4ML ~~LOC~~ SOLN
40.0000 mg | SUBCUTANEOUS | Status: AC
  Administered 2019-09-26 – 2019-10-06 (×11): 40 mg via SUBCUTANEOUS
  Filled 2019-09-26 (×11): qty 0.4

## 2019-09-26 MED ORDER — POTASSIUM CHLORIDE 20 MEQ/15ML (10%) PO SOLN
40.0000 meq | Freq: Once | ORAL | Status: AC
  Administered 2019-09-26: 40 meq via ORAL
  Filled 2019-09-26: qty 30

## 2019-09-26 MED ORDER — LOSARTAN POTASSIUM 25 MG PO TABS
25.0000 mg | ORAL_TABLET | Freq: Every day | ORAL | Status: DC
  Filled 2019-09-26: qty 1

## 2019-09-26 MED ORDER — CARVEDILOL 6.25 MG PO TABS
6.2500 mg | ORAL_TABLET | Freq: Two times a day (BID) | ORAL | Status: DC
  Administered 2019-09-26 – 2019-09-27 (×2): 6.25 mg
  Filled 2019-09-26: qty 1

## 2019-09-26 MED ORDER — PROPOFOL 1000 MG/100ML IV EMUL
0.0000 ug/kg/min | INTRAVENOUS | Status: DC
  Administered 2019-09-26 – 2019-09-27 (×2): 50 ug/kg/min via INTRAVENOUS
  Administered 2019-09-27: 5 ug/kg/min via INTRAVENOUS
  Administered 2019-09-28: 10 ug/kg/min via INTRAVENOUS
  Administered 2019-09-28 (×2): 40 ug/kg/min via INTRAVENOUS
  Administered 2019-09-29: 10 ug/kg/min via INTRAVENOUS
  Administered 2019-09-29 (×2): 50 ug/kg/min via INTRAVENOUS
  Administered 2019-09-30 (×2): 20 ug/kg/min via INTRAVENOUS
  Filled 2019-09-26 (×23): qty 100

## 2019-09-26 MED ORDER — ATORVASTATIN CALCIUM 80 MG PO TABS
80.0000 mg | ORAL_TABLET | Freq: Every day | ORAL | Status: DC
  Administered 2019-09-26 – 2019-11-28 (×64): 80 mg
  Filled 2019-09-26 (×64): qty 1

## 2019-09-26 MED ORDER — ATORVASTATIN CALCIUM 80 MG PO TABS: 80.0000 mg | ORAL_TABLET | Freq: Every day | ORAL | Status: DC

## 2019-09-26 MED ORDER — LOSARTAN POTASSIUM 25 MG PO TABS
25.0000 mg | ORAL_TABLET | Freq: Every day | ORAL | Status: DC
  Administered 2019-09-26 – 2019-09-28 (×3): 25 mg
  Filled 2019-09-26 (×3): qty 1

## 2019-09-26 NOTE — Plan of Care (Signed)
Pt remains in 2heart, intubated and sedated. With TTM in use. Patients temp frequently above goal temp despite the use of tylenol. Continuous EEG in progress.

## 2019-09-26 NOTE — Progress Notes (Signed)
NAME:  Beverly [name], MRN:  [id number], DOB:  07/11/1985, LOS: 2 ADMISSION DATE:  09/24/2019, CONSULTATION DATE:  8/18 REFERRING MD:  Dr. Eldridge DaceVaranasi, CHIEF COMPLAINT:  VF arrest   Brief History   34 year old female 2 weeks post partum presented with chest pain and suffered VF arrest.    History of present illness   Patient is encephalopathic and/or intubated. Therefore history has been obtained from chart review.  34 year old female with PMH as below, which is significant for HTN and 2 weeks post-partum. She had emergency C-section delivery due to premature rupture of membranes. She did have some gestational hypertension, but her pregnancy was otherwise uncomplicated. On 8/17 she developed dull chest pain shortly after eating, which prompted [facility]. Resolved spontaneously. No associated symptoms. While in ED, she suffered VT > VF arrest with ROSC after defib x 3 and epi x 1. Immediately after arrest ST elevation was noted on EKG, which did improve on repeat. Differential ACS, SCAD, and toresades/metabolic. Electrolytes were replaced. She [facility]  Where aneurysmal dilation of the ostial and proximal LAD and [facility]   Past Medical History   has a past medical history of Hypertension and Vaginal Pap smear, abnormal.  Significant Hospital Events   8/17 chest pain, VF arrest  Consults:    Procedures:  8/18> Central Line  Significant Diagnostic Tests:  LHC 8/17 > No coronary artery occlusion or critical stenosis. There is aneurysmal dilation of the ostial and proximal LAD with possible ulceration or dissection of uncertain chronicity.  TIMI-3 flow is noted throughout the LAD and its branches. Severely reduced left ventricular systolic function with mid/apical anterior, apical, and apical inferior hypokinesis/akinesis. LV gram 30% EF.  CT head 8/17 > normal  CTA chest 8/17 > no evidence of PE,  moderate severity infiltrates vs ATX, small bilateral pleural effusions. Very small pericaradial effusion.  CT abdomen/pelvis 8/17 > Enlarged, heterogeneous uterus, Small amount of fluid seen in between the right kidney and right lobe of the liver. Echo 8/18> LVEF 30-35%, septal and apical akinesis distal anterior wall an d mid/distal inferior wall hypokinesis hyperdynamic basal function. LV size mildly dilated.    Micro Data:  Blood 8/18 >  Antimicrobials:  Ceftriazone 8/18 > Azithromycin 8/18 >8/18  Interim history/subjective:  Patient placed on pressure support (CPAP/PS) ON, pulling Vt of 700-900. Patient still sedated.  Spoke with nursing at bedside this AM. States that patient's temperatures have continued to fluctuate, still kept normothermic by [employer]. Nursing noted that propofol has been decreased from 45 to 20, but has noticed increased activity on the EEG after propofol was decreased.   Spoke with mother at bedside, discussed holding sedation to allow her to awake. If she continues to improve, may extubate today.   Objective   Blood pressure 110/89, pulse 98, temperature 98.6 F (37 C), temperature source Esophageal, resp. rate 18, height 5\' 10"  (1.778 m), weight 104.5 kg, SpO2 100 %, unknown if currently breastfeeding. CVP:  [3 mmHg-11 mmHg] 7 mmHg  Vent Mode: CPAP;PSV FiO2 (%):  [40 %] 40 % PEEP:  [5 cmH20] 5 cmH20 Pressure Support:  [10 cmH20] 10 cmH20   Intake/Output Summary (Last 24 hours) at 09/26/2019 0701 Last data filed at 09/26/2019 0600 Gross per 24 hour  Intake 2048.44 ml  Output 2055 ml  Net -6.56 ml   Filed Weights   09/24/19 0500 09/25/19 0400  09/26/19 0400  Weight: 103.1 kg 104.5 kg 104.5 kg    Examination:  Physical Exam Constitutional:      Comments: Sedated, intubated  HENT:     Mouth/Throat:     Comments: Large amount of clear oral secretions.  Eyes:     Pupils: Pupils are equal, round, and reactive to light.  Cardiovascular:     Rate and  Rhythm: Normal rate and regular rhythm.     Pulses: Normal pulses.     Heart sounds: Normal heart sounds. No murmur heard.  No friction rub. No gallop.   Pulmonary:     Effort: Pulmonary effort is normal.     Breath sounds: No wheezing, rhonchi or rales.  Chest:     Chest wall: No tenderness.  Abdominal:     General: Abdomen is flat. Bowel sounds are normal.     Tenderness: There is no abdominal tenderness.  Skin:    General: Skin is warm.      Resolved Hospital Problem list   Hypokalemia  Assessment & Plan:   VF Arrest New Onset Post Partum Cardiomyopathy: - Management per cardiology - ASA  - TTM to keep normothermic 37 F  Acute HFrEF Negative 6 mL,CVP range 7-9, doing well with intermittent lasix.   - Coreg 3.125 mg BID by cardiology - Losartan 25 mg QD by cardiology - Continue to diurese per primary.   Acute Hypoxemic Respiratory Failure 2/2 to Cardiac Arrest, Pulmonary Edema.  - Off sedation this morning, continue to monitor for mental status improvement. May extubate later this afternoon if mentation improves.  - VAP bundle   Hypokalemia K: 3.7 this AM. Continuing to replete as needed for K>4.  - Replete K goal > 4  - Trend BMP  Post-partum:  2 weeks post emergency C-section.  - Monitor   Best practice:  Diet: TF Pain/Anxiety/Delirium protocol (if indicated): Propofol per PAD protocol VAP protocol (if indicated): yes DVT prophylaxis: SCDs GI prophylaxis: PPI Glucose control: SSI Mobility: BR Code Status: FULL  Family Communication: Mother updated at bedside. Disposition: ICU  Labs   CBC: Recent Labs  Lab 09/23/19 1844 09/23/19 1844 09/24/19 0039 09/24/19 0039 09/24/19 0057 09/24/19 0338 09/24/19 0826 09/25/19 0325 09/26/19 0427  WBC 20.3*  --  11.6*  --   --   --  19.0* 12.5* 7.8  HGB 8.4*   < > 9.8*   < > 10.5* 10.2* 9.7* 10.0* 9.9*  HCT 26.7*   < > 32.0*   < > 31.0* 30.0* 32.0* 32.7* 32.7*  MCV 85.0  --  82.9  --   --   --  84.2 85.2  86.5  PLT 590*  --  672*  --   --   --  757* 697* 726*   < > = values in this interval not displayed.    Basic Metabolic Panel: Recent Labs  Lab 09/23/19 1851 09/23/19 2240 09/24/19 0039 09/24/19 0057 09/24/19 0338 09/24/19 0826 09/24/19 1630 09/25/19 0325 09/26/19 0427  NA  --    < > 137  137   < > 138 138 138 136 137  K  --    < > 2.7*  2.7*   < > 3.3* 4.1 3.7 3.8 3.7  CL  --    < > 101  102  --   --  102 102 101 104  CO2  --    < > 21*  21*  --   --  21* 22 21* 24  GLUCOSE  --    < > 115*  114*  --   --  125* 90 83 94  BUN  --    < > 8  8  --   --  7 8 10 12   CREATININE  --    < > 0.88  0.94  --   --  0.75 0.74 0.74 0.59  CALCIUM  --    < > 8.0*  8.0*  --   --  8.5* 8.8* 8.9 8.6*  MG 2.1  --  2.0  --   --  2.0  --  1.8 2.1  PHOS  --   --   --   --   --  4.5  --  4.0  --    < > = values in this interval not displayed.   GFR: Estimated Creatinine Clearance: 132.1 mL/min (by C-G formula based on SCr of 0.59 mg/dL). Recent Labs  Lab 09/24/19 0039 09/24/19 0826 09/25/19 0325 09/26/19 0427  WBC 11.6* 19.0* 12.5* 7.8    Liver Function Tests: Recent Labs  Lab 09/24/19 0039 09/25/19 0325  AST 200* 151*  ALT 69* 64*  ALKPHOS 99 95  BILITOT 0.6 0.7  PROT 6.9 6.9  ALBUMIN 2.7* 2.7*   No results for input(s): LIPASE, AMYLASE in the last 168 hours. No results for input(s): AMMONIA in the last 168 hours.  ABG    Component Value Date/Time   PHART 7.436 09/24/2019 0338   PCO2ART 33.5 09/24/2019 0338   PO2ART 157 (H) 09/24/2019 0338   HCO3 22.4 09/24/2019 0338   TCO2 23 09/24/2019 0338   ACIDBASEDEF 1.0 09/24/2019 0338   O2SAT 99.0 09/24/2019 0338     Coagulation Profile: Recent Labs  Lab 09/23/19 1844 09/24/19 0824  INR 1.3* 1.2    Cardiac Enzymes: No results for input(s): CKTOTAL, CKMB, CKMBINDEX, TROPONINI in the last 168 hours.  HbA1C: Hgb A1c MFr Bld  Date/Time Value Ref Range Status  09/24/2019 08:26 AM 5.7 (H) 4.8 - 5.6 % Final     Comment:    (NOTE) Pre diabetes:          5.7%-6.4%  Diabetes:              >6.4%  Glycemic control for   <7.0% adults with diabetes   03/26/2015 02:58 PM 5.8 (H) 4.8 - 5.6 % Final    Comment:             Pre-diabetes: 5.7 - 6.4          Diabetes: >6.4          Glycemic control for adults with diabetes: <7.0     CBG: Recent Labs  Lab 09/25/19 1222 09/25/19 1603 09/25/19 1954 09/25/19 2346 09/26/19 0346  GLUCAP 61* 107* 74 85 95    Review of Systems:   Patient is encephalopathic and/or intubated. Therefore history has been obtained from chart review.   Past Medical History  She,  has a past medical history of Hypertension and Vaginal Pap smear, abnormal.   Surgical History    Past Surgical History:  Procedure Laterality Date  . LEFT HEART CATH AND CORONARY ANGIOGRAPHY N/A 09/23/2019   Procedure: LEFT HEART CATH AND CORONARY ANGIOGRAPHY;  Surgeon: 09/25/2019, MD;  Location: [facility] INVASIVE CV LAB;  Service: Cardiovascular;  Laterality: N/A;     Social History   reports that she has quit smoking. She has never used smokeless tobacco. She reports current alcohol use. She reports that she  does not use drugs.   Family History   Her family history includes Diabetes in her maternal grandmother and paternal grandmother; Hyperlipidemia in her mother; Hypertension in her mother; Rashes / Skin problems in her son; Seizures in her maternal grandmother and son; Thyroid disease in her maternal grandmother.   Allergies No Known Allergies   Home Medications  Prior to Admission medications   Medication Sig Start Date End Date Taking? Authorizing Provider  acetaminophen (TYLENOL) 500 MG tablet Take 500-1,000 mg by mouth every 6 (six) hours as needed for mild pain or fever.    [provider]  ibuprofen (ADVIL) 200 MG tablet Take 400-800 mg by mouth every 6 (six) hours as needed for fever or mild pain.    [provider]  oxyCODONE (OXY IR/ROXICODONE) 5 MG  immediate release tablet Take 5 mg by mouth every 4 (four) hours as needed for severe pain.    [provider]         Dolan Amen, MD IMTS, PGY-2 09/26/2019,7:01 AM

## 2019-09-26 NOTE — Progress Notes (Signed)
Discontinued LTM, no skin breakdown was seen.

## 2019-09-26 NOTE — Progress Notes (Signed)
Progress Note  Patient Name: Beverly [name] Date of Encounter: 09/26/2019  [facility] Cardiologist: No primary care provider on file.   Subjective   Intubated, sedated, not responsive  Inpatient Medications    Scheduled Meds: . aspirin  81 mg Per Tube Daily  . carvedilol  3.125 mg Per Tube BID WC  . chlorhexidine gluconate (MEDLINE KIT)  15 mL Mouth Rinse BID  . Chlorhexidine Gluconate Cloth  6 each Topical Daily  . docusate  100 mg Oral BID  . feeding supplement (PROSource TF)  90 mL Per Tube TID  . feeding supplement (VITAL HIGH PROTEIN)  1,000 mL Per Tube Q24H  . insulin aspart  0-15 Units Subcutaneous Q4H  . mouth rinse  15 mL Mouth Rinse 10 times per day  . pantoprazole (PROTONIX) IV  40 mg Intravenous Q24H  . polyethylene glycol  17 g Oral Daily  . sodium chloride flush  10-40 mL Intracatheter Q12H   Continuous Infusions: . sodium chloride 10 mL/hr at 09/26/19 0700  . cefTRIAXone (ROCEPHIN)  IV 2 g (09/25/19 2214)  . levETIRAcetam 1,500 mg (09/25/19 2108)  . propofol (DIPRIVAN) infusion 30 mcg/kg/min (09/26/19 0700)   PRN Meds: acetaminophen, fentaNYL (SUBLIMAZE) injection, midazolam, sodium chloride flush   Vital Signs    Vitals:   09/26/19 0400 09/26/19 0500 09/26/19 0600 09/26/19 0700  BP: 130/88 119/87 110/89 115/87  Pulse: (!) 107 100 98 93  Resp: 20 17 18 13   Temp: 98.6 F (37 C) (!) 97.5 F (36.4 C) 98.6 F (37 C) 98.4 F (36.9 C)  TempSrc:   Esophageal   SpO2: 100% 100% 100% 100%  Weight: 104.5 kg     Height:        Intake/Output Summary (Last 24 hours) at 09/26/2019 0758 Last data filed at 09/26/2019 0700 Gross per 24 hour  Intake 2177.57 ml  Output 2055 ml  Net 122.57 ml   Last 3 Weights 09/26/2019 09/25/2019 09/24/2019  Weight (lbs) 230 lb 6.1 oz 230 lb 6.1 oz 227 lb 4.7 oz  Weight (kg) 104.5 kg 104.5 kg 103.1 kg  Some encounter information is confidential and restricted. Go to Review Flowsheets activity to see all data.       Telemetry    Sinus rhythm/sinus tach, no significant or sustained arrhythmia - Personally Reviewed   Physical Exam  Intubated, unresponsive GEN: as above   Neck: No JVD Cardiac: tachy and irregular, no murmurs, rubs, or gallops.  Respiratory: Clear to auscultation bilaterally. GI: Soft, nontender, non-distended  MS: trace diffuse edema; No deformity. Neuro:  non-responsive, pupils reactive Psych: unable to assess Lines: left IJ, peripheral IV's clear  Labs    High Sensitivity Troponin:   Recent Labs  Lab 09/23/19 1539 09/23/19 1816 09/24/19 0039 09/24/19 0826  TROPONINIHS 127* 2,078* 24,421* >27,000*      Chemistry Recent Labs  Lab 09/24/19 0039 09/24/19 0057 09/24/19 1630 09/25/19 0325 09/26/19 0427  NA 137  137   < > 138 136 137  K 2.7*  2.7*   < > 3.7 3.8 3.7  CL 101  102   < > 102 101 104  CO2 21*  21*   < > 22 21* 24  GLUCOSE 115*  114*   < > 90 83 94  BUN 8  8   < > 8 10 12   CREATININE 0.88  0.94   < > 0.74 0.74 0.59  CALCIUM 8.0*  8.0*   < > 8.8* 8.9 8.6*  PROT  6.9  --   --  6.9  --   ALBUMIN 2.7*  --   --  2.7*  --   AST 200*  --   --  151*  --   ALT 69*  --   --  64*  --   ALKPHOS 99  --   --  95  --   BILITOT 0.6  --   --  0.7  --   GFRNONAA >60  >60   < > >60 >60 >60  GFRAA >60  >60   < > >60 >60 >60  ANIONGAP 15  14   < > 14 14 9    < > = values in this interval not displayed.     Hematology Recent Labs  Lab 09/24/19 0826 09/25/19 0325 09/26/19 0427  WBC 19.0* 12.5* 7.8  RBC 3.80* 3.84* 3.78*  HGB 9.7* 10.0* 9.9*  HCT 32.0* 32.7* 32.7*  MCV 84.2 85.2 86.5  MCH 25.5* 26.0 26.2  MCHC 30.3 30.6 30.3  RDW 16.8* 16.8* 16.9*  PLT 757* 697* 726*    BNP Recent Labs  Lab 09/23/19 1539 09/24/19 0039  BNP 72.4 250.4*     DDimer No results for input(s): DDIMER in the last 168 hours.   Radiology    EEG adult  Result Date: 09/24/2019 [name], MD     09/24/2019  9:55 AM Patient Name: Beverly [name] MRN:  [id number] Epilepsy Attending: [name] Referring Physician/Provider: Dr. Therese [name] Date: 09/24/2019 Duration: 25.44 mins Patient history: 34 year old female status post cardiac arrest and seizure-like activity.  EEG to assess for seizures. Level of alertness: Comatose AEDs during EEG study: Keppra, propofol Technical aspects: This EEG study was done with scalp electrodes positioned according to the 10-20 International system of electrode placement. Electrical activity was acquired at a sampling rate of 500Hz  and reviewed with a high frequency filter of 70Hz  and a low frequency filter of 1Hz . EEG data were recorded continuously and digitally stored. Description: EEG showed continuous generalized 3 to 5 Hz theta-delta slowing which at times appeared sharply contoured.  EEG was reactive to tactile stimulation.  Hyperventilation and photic stimulation were not performed.   ABNORMALITY -Continuous slow, generalized IMPRESSION: This study is suggestive of severe diffuse encephalopathy, nonspecific to etiology.  No seizures or definite epileptiform discharges were seen throughout the recording. Priyanka Barbra Sarks   Overnight EEG with video  Result Date: 09/25/2019 [name], MD     09/25/2019  9:43 AM Patient Name: Beverly [name] MRN: [id number] Epilepsy Attending: [name] Referring Physician/Provider: Dr. Therese [name] Duration: 09/24/2019 0950 to 09/25/2019 0930  Patient history: 34 year old female status post cardiac arrest and seizure-like activity.  EEG to assess for seizures.  Level of alertness: Comatose  AEDs during EEG study: Keppra, propofol  Technical aspects: This EEG study was done with scalp electrodes positioned according to the 10-20 International system of electrode placement. Electrical activity was acquired at a sampling rate of 500Hz  and reviewed with a high frequency filter of 70Hz  and a low frequency filter of 1Hz . EEG data were recorded continuously and  digitally stored.  Description: EEG showed continuous generalized 3 to 5 Hz theta-delta slowing which at times appeared sharply contoured. EEG was reactive to tactile stimulation.  Hyperventilation and photic stimulation were not performed.  Event button was pressed on 09/25/2019 at 0339 for unclear reasons. Concomitant eeg before, during and after the event didn't show any eeg change to suggest seizure.  ABNORMALITY -  Continuous slow, generalized  IMPRESSION: This study is suggestive of severe diffuse encephalopathy, nonspecific to etiology.  No seizures or definite epileptiform discharges were seen throughout the recording.   [name]   ECHOCARDIOGRAM COMPLETE  Result Date: 09/24/2019    ECHOCARDIOGRAM REPORT   Patient Name:   ADRIEANNA BOTELER Date of Exam: 09/24/2019 Medical Rec #:  [id number]        Height:       70.0 in Accession #:    [id number]       Weight:       227.3 lb Date of Birth:  09/11/1985         BSA:          2.204 m Patient Age:    34 years         BP:           125/83 mmHg Patient Gender: F                HR:           108 bpm. Exam Location:  Inpatient Procedure: 2D Echo Indications:    cardiac arrest  History:        Patient has no prior history of Echocardiogram examinations.                 Arrythmias:V-Tach.  Sonographer:    Johny Chess Referring Phys: [id number] [place] A Gasper Sells  Sonographer Comments: Echo performed with patient supine and on artificial respirator. Image acquisition challenging due to respiratory motion and Image acquisition challenging due to patient body habitus. IMPRESSIONS  1. Septal and apical akinesis Distal anterior wall and mid/distal inferior wall hypokinesis Hyperdynamic basal function . Left ventricular ejection fraction, by estimation, is 30 to 35%. The left ventricle has moderately decreased function. The left ventricle demonstrates regional wall motion abnormalities (see scoring diagram/findings for description). The left ventricular  internal cavity size was mildly dilated. Left ventricular diastolic parameters are indeterminate.  2. Right ventricular systolic function is normal. The right ventricular size is normal.  3. The mitral valve is normal in structure. Trivial mitral valve regurgitation. No evidence of mitral stenosis.  4. The aortic valve was not well visualized. Aortic valve regurgitation is not visualized. No aortic stenosis is present.  5. The inferior vena cava is normal in size with greater than 50% respiratory variability, suggesting right atrial pressure of 3 mmHg. FINDINGS  Left Ventricle: Septal and apical akinesis Distal anterior wall and mid/distal inferior wall hypokinesis Hyperdynamic basal function. Left ventricular ejection fraction, by estimation, is 30 to 35%. The left ventricle has moderately decreased function. The left ventricle demonstrates regional wall motion abnormalities. The left ventricular internal cavity size was mildly dilated. There is no left ventricular hypertrophy. Left ventricular diastolic parameters are indeterminate. Right Ventricle: The right ventricular size is normal. No increase in right ventricular wall thickness. Right ventricular systolic function is normal. Left Atrium: Left atrial size was normal in size. Right Atrium: Right atrial size was normal in size. Pericardium: Trivial pericardial effusion is present. The pericardial effusion is lateral to the left ventricle. Mitral Valve: The mitral valve is normal in structure. Normal mobility of the mitral valve leaflets. Trivial mitral valve regurgitation. No evidence of mitral valve stenosis. Tricuspid Valve: The tricuspid valve is normal in structure. Tricuspid valve regurgitation is not demonstrated. No evidence of tricuspid stenosis. Aortic Valve: The aortic valve was not well visualized. Aortic valve regurgitation is not visualized. No aortic stenosis is present. Pulmonic Valve:  The pulmonic valve was normal in structure. Pulmonic valve  regurgitation is not visualized. No evidence of pulmonic stenosis. Aorta: The aortic root is normal in size and structure. Venous: The inferior vena cava is normal in size with greater than 50% respiratory variability, suggesting right atrial pressure of 3 mmHg. IAS/Shunts: No atrial level shunt detected by color flow Doppler.  LEFT VENTRICLE PLAX 2D LVIDd:         5.10 cm LVIDs:         3.70 cm LV PW:         1.00 cm LV IVS:        1.10 cm LVOT diam:     2.10 cm LV SV:         56 LV SV Index:   26 LVOT Area:     3.46 cm  LV Volumes (MOD) LV vol d, MOD A4C: 112.0 ml LV vol s, MOD A4C: 73.3 ml LV SV MOD A4C:     112.0 ml RIGHT VENTRICLE RV S prime:     13.30 cm/s TAPSE (M-mode): 2.2 cm LEFT ATRIUM             Index       RIGHT ATRIUM           Index LA diam:        3.10 cm 1.41 cm/m  RA Area:     14.70 cm LA Vol (A2C):   64.1 ml 29.08 ml/m RA Volume:   38.80 ml  17.60 ml/m LA Vol (A4C):   70.3 ml 31.90 ml/m LA Biplane Vol: 74.0 ml 33.58 ml/m  AORTIC VALVE LVOT Vmax:   93.30 cm/s LVOT Vmean:  66.000 cm/s LVOT VTI:    0.163 m  AORTA Ao Root diam: 3.30 cm Ao Asc diam:  2.80 cm  SHUNTS Systemic VTI:  0.16 m Systemic Diam: 2.10 cm Jenkins Rouge MD Electronically signed by Jenkins Rouge MD Signature Date/Time: 09/24/2019/4:08:10 PM    Final     Cardiac Studies   See section above (reviewed)  Patient Profile     34 y.o. female 2 weeks post-partum with VF/torsades arrest in the setting of anterior MI  Assessment & Plan    1. VF arrest (in-hospital): in setting of anterior infarct. Rhythm has stabilized, off amiodarone, no significant arrhythmia on continuous telemetry. Continue beta-blockade. 2. Acute systolic heart failure: secondary to anterior MI, LVEF 35%. Tolerating carvedilol at low dose. Start losartan 25 mg today. Has diuresed well with intermittent IV lasix. CVP < 10.  3. CAD/Anterior MI: angio reviewed again. Appears to have aneurysmal proximal LAD with dissection, likely culprit. Had TIMI-3 flow  at cath but large infarct. Would anticipate relook catheterization once she recovers, likely medical therapy but will need to reassess. Continue ASA,  beta-blocker. Add lipid panel and start statin. LFT's were elevated but likely secondary to MI. Repeat LFT"s in am.  4. Encephalopathy, post-arrest. TTM continues, normothermia being maintained. Doesn't appear to be making much progress yet.  Neuro following.   Cardiac dispo: add losartan, await neurologic recovery, anticipate relook cardiac cath pending neuro status/overall recovery.   For questions or updates, please contact [facility] Please consult [email or web address] for contact info under        Signed, Sherren Mocha, MD  09/26/2019, 7:58 AM

## 2019-09-26 NOTE — Procedures (Addendum)
Patient Name:Beverly [name] MWN:[id number] Epilepsy Attending:Jozef Eisenbeis [name] Referring Physician/Provider:Dr. Lindie Spruce Duration:09/25/2019 0950 to 09/26/2019 1150  Patient history:34 year old female status post cardiac arrest and seizure-like activity. EEG to assess for seizures.  Level of alertness:Comatose  AEDs during EEG study:Keppra, propofol  Technical aspects: This EEG study was done with scalp electrodes positioned according to the 10-20 International system of electrode placement. Electrical activity was acquired at a sampling rate of 500Hz  and reviewed with a high frequency filter of 70Hz  and a low frequency filter of 1Hz . EEG data were recorded continuously and digitally stored.   Description:EEG showed continuous generalized 6-9Hz  theta-alpha activity as well as 2-3Hz  delta activity. EEG was reactive to tactile stimulation. Hyperventilation and photic stimulation were not performed.   Event button was pressed on 09/26/2019 at 0753 and at 0927 for unclear reasons. Concomitant eeg before, during and after the event didn't show any eeg change to suggest seizure.   ABNORMALITY -Continuous slow, generalized  IMPRESSION: This study issuggestive of severe diffuse encephalopathy, nonspecific to etiology.No seizures ordefinite epileptiform discharges were seen throughout the recording.  EEG appears to be improving compared to previous study.    Alishia Lebo 

## 2019-09-27 LAB — BASIC METABOLIC PANEL
Anion gap: 13 (ref 5–15)
Anion gap: 13 (ref 5–15)
BUN: 17 mg/dL (ref 6–20)
BUN: 17 mg/dL (ref 6–20)
CO2: 24 mmol/L (ref 22–32)
CO2: 25 mmol/L (ref 22–32)
Calcium: 9.2 mg/dL (ref 8.9–10.3)
Calcium: 9 mg/dL (ref 8.9–10.3)
Chloride: 104 mmol/L (ref 98–111)
Chloride: 104 mmol/L (ref 98–111)
Creatinine, Ser: 0.72 mg/dL (ref 0.44–1.00)
Creatinine, Ser: 0.66 mg/dL (ref 0.44–1.00)
GFR calc Af Amer: >60 mL/min (ref >60)
GFR calc Af Amer: >60 mL/min (ref >60)
GFR calc non Af Amer: >60 mL/min (ref >60)
GFR calc non Af Amer: >60 mL/min (ref >60)
Glucose, Bld: 95 mg/dL (ref 70–99)
Glucose, Bld: 111 mg/dL — ABNORMAL HIGH (ref 70–99)
Potassium: 3.7 mmol/L (ref 3.5–5.1)
Potassium: 3.5 mmol/L (ref 3.5–5.1)
Sodium: 141 mmol/L (ref 135–145)
Sodium: 142 mmol/L (ref 135–145)

## 2019-09-27 LAB — CORTISOL: Cortisol, Plasma: 27 ug/dL

## 2019-09-27 LAB — HEPATIC FUNCTION PANEL
ALT: 40 U/L (ref 0–44)
AST: 41 U/L (ref 15–41)
Albumin: 2.4 g/dL — ABNORMAL LOW (ref 3.5–5.0)
Alkaline Phosphatase: 84 U/L (ref 38–126)
Bilirubin, Direct: <0.1 mg/dL (ref 0.0–0.2)
Total Bilirubin: 0.6 mg/dL (ref 0.3–1.2)
Total Protein: 6.6 g/dL (ref 6.5–8.1)

## 2019-09-27 LAB — GLUCOSE, CAPILLARY
Glucose-Capillary: 100 mg/dL — ABNORMAL HIGH (ref 70–99)
Glucose-Capillary: 121 mg/dL — ABNORMAL HIGH (ref 70–99)
Glucose-Capillary: 126 mg/dL — ABNORMAL HIGH (ref 70–99)
Glucose-Capillary: 72 mg/dL (ref 70–99)
Glucose-Capillary: 87 mg/dL (ref 70–99)
Glucose-Capillary: 95 mg/dL (ref 70–99)

## 2019-09-27 LAB — CBC
HCT: 33.2 % — ABNORMAL LOW (ref 36.0–46.0)
Hemoglobin: 9.8 g/dL — ABNORMAL LOW (ref 12.0–15.0)
MCH: 25.9 pg — ABNORMAL LOW (ref 26.0–34.0)
MCHC: 29.5 g/dL — ABNORMAL LOW (ref 30.0–36.0)
MCV: 87.6 fL (ref 80.0–100.0)
Platelets: 679 10*3/uL — ABNORMAL HIGH (ref 150–400)
RBC: 3.79 MIL/uL — ABNORMAL LOW (ref 3.87–5.11)
RDW: 17.1 % — ABNORMAL HIGH (ref 11.5–15.5)
WBC: 8.6 10*3/uL (ref 4.0–10.5)
nRBC: 0 % (ref 0.0–0.2)

## 2019-09-27 LAB — COOXEMETRY PANEL
Carboxyhemoglobin: 1.2 % (ref 0.5–1.5)
Methemoglobin: 1.3 % (ref 0.0–1.5)
O2 Saturation: 72.1 %
Total hemoglobin: 9.6 g/dL — ABNORMAL LOW (ref 12.0–16.0)

## 2019-09-27 LAB — TRIGLYCERIDES: Triglycerides: 368 mg/dL — ABNORMAL HIGH (ref <150)

## 2019-09-27 MED ORDER — CARVEDILOL 6.25 MG PO TABS
6.2500 mg | ORAL_TABLET | Freq: Two times a day (BID) | ORAL | Status: DC
  Administered 2019-09-27 – 2019-09-28 (×2): 6.25 mg
  Filled 2019-09-27 (×2): qty 1

## 2019-09-27 MED ORDER — DIGOXIN 125 MCG PO TABS
0.1250 mg | ORAL_TABLET | Freq: Every day | ORAL | Status: DC
  Administered 2019-09-27 – 2019-09-29 (×3): 0.125 mg via ORAL
  Filled 2019-09-27 (×3): qty 1

## 2019-09-27 MED ORDER — ACETAMINOPHEN 325 MG PO TABS
650.0000 mg | ORAL_TABLET | ORAL | Status: DC
  Filled 2019-09-27: qty 2

## 2019-09-27 MED ORDER — ACETAMINOPHEN 160 MG/5ML PO SOLN
650.0000 mg | ORAL | Status: DC
  Administered 2019-09-27 – 2019-10-06 (×54): 650 mg
  Filled 2019-09-27 (×54): qty 20.3

## 2019-09-27 MED ORDER — SODIUM CHLORIDE 0.9 % IV BOLUS
500.0000 mL | Freq: Once | INTRAVENOUS | Status: AC
  Administered 2019-09-27: 500 mL via INTRAVENOUS

## 2019-09-27 MED ORDER — DEXTROSE 10 % IV SOLN: INTRAVENOUS | Status: DC

## 2019-09-27 MED ORDER — SPIRONOLACTONE 12.5 MG HALF TABLET
12.5000 mg | ORAL_TABLET | Freq: Every day | ORAL | Status: DC
  Administered 2019-09-27 – 2019-09-28 (×2): 12.5 mg via ORAL
  Filled 2019-09-27 (×3): qty 1

## 2019-09-27 MED ORDER — POTASSIUM CHLORIDE 20 MEQ PO PACK
40.0000 meq | PACK | Freq: Once | ORAL | Status: AC
  Administered 2019-09-27: 40 meq via ORAL
  Filled 2019-09-27: qty 2

## 2019-09-27 MED ORDER — POTASSIUM CHLORIDE CRYS ER 20 MEQ PO TBCR
40.0000 meq | EXTENDED_RELEASE_TABLET | Freq: Once | ORAL | Status: DC
  Filled 2019-09-27: qty 2

## 2019-09-27 NOTE — Progress Notes (Addendum)
ADVANCED HF PROGRESS  Patient Name: Beverly [name] Date of Encounter: 09/27/2019  [facility] Cardiologist: No primary care provider on file.   Subjective   Rewarmed but remains unresponsive. Sedation stopped this am. Remains febrile. CVP 2-3  Inpatient Medications    Scheduled Meds: . acetaminophen (TYLENOL) oral liquid 160 mg/5 mL  650 mg Per Tube Q4H  . aspirin  81 mg Per Tube Daily  . atorvastatin  80 mg Per Tube Daily  . carvedilol  6.25 mg Per Tube BID  . chlorhexidine gluconate (MEDLINE KIT)  15 mL Mouth Rinse BID  . Chlorhexidine Gluconate Cloth  6 each Topical Daily  . docusate  100 mg Oral BID  . enoxaparin (LOVENOX) injection  40 mg Subcutaneous Q24H  . feeding supplement (PROSource TF)  90 mL Per Tube TID  . feeding supplement (VITAL HIGH PROTEIN)  1,000 mL Per Tube Q24H  . insulin aspart  0-15 Units Subcutaneous Q4H  . losartan  25 mg Per Tube Daily  . mouth rinse  15 mL Mouth Rinse 10 times per day  . pantoprazole (PROTONIX) IV  40 mg Intravenous Q24H  . polyethylene glycol  17 g Oral Daily  . sodium chloride flush  10-40 mL Intracatheter Q12H   Continuous Infusions: . sodium chloride 10 mL/hr at 09/27/19 0900  . cefTRIAXone (ROCEPHIN)  IV 2 g (09/26/19 2148)  . dextrose 20 mL/hr at 09/27/19 0900  . levETIRAcetam 1,500 mg (09/26/19 2127)  . propofol (DIPRIVAN) infusion Stopped (09/27/19 0915)   PRN Meds: fentaNYL (SUBLIMAZE) injection, midazolam, sodium chloride flush   Vital Signs    Vitals:   09/27/19 0645 09/27/19 0700 09/27/19 0800 09/27/19 0843  BP: 123/83 (!) 130/94 123/90   Pulse: (!) 115 (!) 120 (!) 122   Resp: 14 14 12 17   Temp: 98.8 F (37.1 C) 99 F (37.2 C) 99.9 F (37.7 C)   TempSrc:  Esophageal    SpO2: 100% 100% 100%   Weight:      Height:        Intake/Output Summary (Last 24 hours) at 09/27/2019 1006 Last data filed at 09/27/2019 0900 Gross per 24 hour  Intake 1242.86 ml  Output 1735 ml  Net -492.14 ml   Last 3  Weights 09/27/2019 09/26/2019 09/25/2019  Weight (lbs) 218 lb 15.7 oz 230 lb 6.1 oz 230 lb 6.1 oz  Weight (kg) 99.33 kg 104.5 kg 104.5 kg  Some encounter information is confidential and restricted. Go to Review Flowsheets activity to see all data.      Telemetry    Sinus tach 120s Personally reviewed   Physical Exam   General: Intubated. Unresponsive HEENT: normal + ETT Neck: supple. no JVD. Cor: PMI nondisplaced.Tachy regular + s3 Lungs: clear Abdomen: soft, nontender, nondistended. No hepatosplenomegaly. No bruits or masses. Good bowel sounds. Extremities: no cyanosis, clubbing, rash, edema Neuro: alert & orientedx3, cranial nerves grossly intact. moves all 4 extremities w/o difficulty. Affect pleasant   Labs    High Sensitivity Troponin:   Recent Labs  Lab 09/23/19 1539 09/23/19 1816 09/24/19 0039 09/24/19 0826  TROPONINIHS 127* 2,078* 24,421* >27,000*      Chemistry Recent Labs  Lab 09/24/19 0039 09/24/19 0057 09/25/19 0325 09/25/19 0325 09/26/19 0427 09/27/19 0321 09/27/19 0642  NA 137  137   < > 136   < > 137 141 142  K 2.7*  2.7*   < > 3.8   < > 3.7 3.7 3.5  CL 101  102   < >  101   < > 104 104 104  CO2 21*  21*   < > 21*   < > 24 24 25   GLUCOSE 115*  114*   < > 83   < > 94 95 111*  BUN 8  8   < > 10   < > 12 17 17   CREATININE 0.88  0.94   < > 0.74   < > 0.59 0.72 0.66  CALCIUM 8.0*  8.0*   < > 8.9   < > 8.6* 9.2 9.0  PROT 6.9  --  6.9  --   --  6.6  --   ALBUMIN 2.7*  --  2.7*  --   --  2.4*  --   AST 200*  --  151*  --   --  41  --   ALT 69*  --  64*  --   --  40  --   ALKPHOS 99  --  95  --   --  84  --   BILITOT 0.6  --  0.7  --   --  0.6  --   GFRNONAA >60  >60   < > >60   < > >60 >60 >60  GFRAA >60  >60   < > >60   < > >60 >60 >60  ANIONGAP 15  14   < > 14   < > 9 13 13    < > = values in this interval not displayed.     Hematology Recent Labs  Lab 09/25/19 0325 09/26/19 0427 09/27/19 0328  WBC 12.5* 7.8 8.6  RBC 3.84* 3.78*  3.79*  HGB 10.0* 9.9* 9.8*  HCT 32.7* 32.7* 33.2*  MCV 85.2 86.5 87.6  MCH 26.0 26.2 25.9*  MCHC 30.6 30.3 29.5*  RDW 16.8* 16.9* 17.1*  PLT 697* 726* 679*    BNP Recent Labs  Lab 09/23/19 1539 09/24/19 0039  BNP 72.4 250.4*     DDimer No results for input(s): DDIMER in the last 168 hours.   Radiology    No results found.  Cardiac Studies   See section above (reviewed)  Patient Profile     34 y.o. female 2 weeks post-partum with VF/torsades arrest in the setting of anterior MI  Assessment & Plan    1. VF arrest (in-hospital): in setting of anterior infarct. Rhythm has stabilized, off amiodarone, no significant arrhythmia on continuous telemetry. Continue beta-blockade.  - Now rewarmed but not waking up. Concern for anoxic injury - Sedation stopped by CCM this am. They are considering MRI in am if not improving  - EEG 8/20 diffuse slowing but improved 2. Acute systolic heart failure: secondary to anterior MI, LVEF 35%.  - Tolerating carvedilol 6.25 bid - On losartan 25 mg  - Has diuresed well with intermit IV lasix. CVP < 10 - Remains tachy. K low - Add spiro 12.5. Check co-ox - CVP 2-3 can give back some fluid if needed 3. CAD/Anterior MI: angio reviewed again. Appears to have aneurysmal proximal LAD with dissection, likely culprit. Had TIMI-3 flow at cath but large infarct. Would anticipate relook catheterization once she recovers, likely medical therapy but will need to reassess.  - Continue ASA,  beta-blocker and statin.  4. Encephalopathy, post-arrest. - plan as above 5. Hypokalemia - supp. Add spiro  CRITICAL CARE Performed by: Glori Bickers  Total critical care time: 35 minutes  Critical care time was exclusive of separately billable procedures and treating other patients.  Critical  care was necessary to treat or prevent imminent or life-threatening deterioration.  Critical care was time spent personally by me (independent of midlevel providers  or residents) on the following activities: development of treatment plan with patient and/or surrogate as well as nursing, discussions with consultants, evaluation of patient's response to treatment, examination of patient, obtaining history from patient or surrogate, ordering and performing treatments and interventions, ordering and review of laboratory studies, ordering and review of radiographic studies, pulse oximetry and re-evaluation of patient's condition.    For questions or updates, please contact [place] Please consult [email or web address] for contact info under        Signed, Glori Bickers, MD  09/27/2019, 10:06 AM

## 2019-09-27 NOTE — Plan of Care (Signed)
SAT / SBT Attempt today started at 0907 - Propofol stopped all sedation off MV from [facility] to PS/CPAP 40% 8/5 - Patient tolerating MV well, taking spontaneous breath with mild stimulation.  RR 7-12.   Sedation remained off.  No PRNs given.  RASS -4 (GOAL -1)  +Gag, -Cough, +Pupils (Brisk/ PERRLA), +Dolls Eyes, +Corneals, +Pain Localized BLE, -Pain response BUE/Clavicular, No following commands, No spontaneous movement at this time.  NSR-ST.  CVP 1-5.  12hr UOP .  Administering 500 saline bolus per orders - awaiting results.  Mother at bedside, visiting: updated. Verbalized understanding and stated no additional questions at this time.     Docia Chuck, RN CVICU      Problem: Education: Goal: Knowledge of General Education information will improve Description: Including pain rating scale, medication(s)/side effects and non-pharmacologic comfort measures Outcome: Progressing   Problem: Health Behavior/Discharge Planning: Goal: Ability to manage health-related needs will improve Outcome: Progressing   Problem: Clinical Measurements: Goal: Ability to maintain clinical measurements within normal limits will improve Outcome: Progressing Goal: Will remain free from infection Outcome: Progressing Goal: Diagnostic test results will improve Outcome: Progressing Goal: Respiratory complications will improve Outcome: Progressing Goal: Cardiovascular complication will be avoided Outcome: Progressing   Problem: Activity: Goal: Risk for activity intolerance will decrease Outcome: Progressing   Problem: Nutrition: Goal: Adequate nutrition will be maintained Outcome: Progressing   Problem: Coping: Goal: Level of anxiety will decrease Outcome: Progressing   Problem: Elimination: Goal: Will not experience complications related to bowel motility Outcome: Progressing Goal: Will not experience complications related to urinary retention Outcome: Progressing   Problem: Pain  Managment: Goal: General experience of comfort will improve Outcome: Progressing   Problem: Safety: Goal: Ability to remain free from injury will improve Outcome: Progressing   Problem: Skin Integrity: Goal: Risk for impaired skin integrity will decrease Outcome: Progressing

## 2019-09-27 NOTE — Progress Notes (Signed)
NAME:  REDELL BHANDARI, MRN:  [id number], DOB:  1985-10-13, LOS: 3 ADMISSION DATE:  09/24/2019, CONSULTATION DATE:  8/18 REFERRING MD:  Dr. Eldridge Dace, CHIEF COMPLAINT:  VF arrest   Brief History   34 year old female 2 weeks post partum presented with chest pain and suffered VF arrest.    History of present illness   Patient is encephalopathic and/or intubated. Therefore history has been obtained from chart review.  34 year old female with PMH as below, which is significant for HTN and 2 weeks post-partum. She had emergency C-section delivery due to premature rupture of membranes. She did have some gestational hypertension, but her pregnancy was otherwise uncomplicated. On 8/17 she developed dull chest pain shortly after eating, which prompted ED presentation at [facility]. Resolved spontaneously. No associated symptoms. While in ED, she suffered VT > VF arrest with ROSC after defib x 3 and epi x 1. Immediately after arrest ST elevation was noted on EKG, which did improve on repeat. Differential ACS, SCAD, and toresades/metabolic. Electrolytes were replaced. She was taken to the cath lab at [facility]  Where aneurysmal dilation of the ostial and proximal LAD and was transferred to [facility] for ICU admission.   Past Medical History   has a past medical history of Hypertension and Vaginal Pap smear, abnormal.  Significant Hospital Events   8/17 chest pain, VF arrest  Consults:    Procedures:  8/18> Central Line  Significant Diagnostic Tests:  LHC 8/17 > No coronary artery occlusion or critical stenosis. There is aneurysmal dilation of the ostial and proximal LAD with possible ulceration or dissection of uncertain chronicity.  TIMI-3 flow is noted throughout the LAD and its branches. Severely reduced left ventricular systolic function with mid/apical anterior, apical, and apical inferior hypokinesis/akinesis. LV gram 30% EF.  CT head 8/17 > normal  CTA chest 8/17 > no evidence of PE,  moderate severity infiltrates vs ATX, small bilateral pleural effusions. Very small pericaradial effusion.  CT abdomen/pelvis 8/17 > Enlarged, heterogeneous uterus, Small amount of fluid seen in between the right kidney and right lobe of the liver. Echo 8/18> LVEF 30-35%, septal and apical akinesis distal anterior wall an d mid/distal inferior wall hypokinesis hyperdynamic basal function. LV size mildly dilated.    Micro Data:  Blood 8/18 >  Antimicrobials:  Ceftriazone 8/18 > Azithromycin 8/18 >8/18  Interim history/subjective:  Patient placed on pressure support (CPAP/PS) ON, pulling Vt of 700-900. Patient still sedated.  Spoke with nursing at bedside this AM. States that patient's temperatures have continued to fluctuate, still kept normothermic by [employer]. Nursing noted that propofol has been decreased from 45 to 20, but has noticed increased activity on the EEG after propofol was decreased.   Spoke with mother at bedside, discussed holding sedation to allow her to awake. If she continues to improve, may extubate today.   Objective   Blood pressure 123/90, pulse (!) 122, temperature 99.9 F (37.7 C), resp. rate 17, height 5\' 10"  (1.778 m), weight 99.3 kg, SpO2 100 %, unknown if currently breastfeeding. CVP:  [0 mmHg-10 mmHg] 2 mmHg  Vent Mode: CPAP;PSV FiO2 (%):  [40 %] 40 % Set Rate:  [16 bmp] 16 bmp Vt Set:  [540 mL] 540 mL PEEP:  [5 cmH20] 5 cmH20 Pressure Support:  [5 cmH20-10 cmH20] 5 cmH20 Plateau Pressure:  [12 cmH20-14 cmH20] 14 cmH20   Intake/Output Summary (Last 24 hours) at 09/27/2019 0859 Last data filed at 09/27/2019 0700 Gross per 24 hour  Intake 1622.41 ml  Output 1880 ml  Net -257.59 ml   Filed Weights   09/25/19 0400 09/26/19 0400 09/27/19 0500  Weight: 104.5 kg 104.5 kg 99.3 kg    Examination: GEN: sedated woman on vent HEENT: ETT in place, mild thick secretions CV: RRR, ext warm PULM: Scattered rhonci and wheezing, triggering vent GI: soft,  hypoactive BS EXT: no edema NEURO:  No corneals Pupils are reactive Opens eyes to pain but no motor response Triggers vent, has cough/gag  PSYCH: cannot assess SKIN: no rashes  Persistent hypoglycemia now on d10 gtt TG a bit up BMP stable Stable thrombocythemia  Resolved Hospital Problem list   Hypokalemia  Assessment & Plan:   VF Arrest New Onset Post Partum Cardiomyopathy HFrEF - HF management and diuretics per cardiology  - TTM to keep normothermic 37 F - EEG showing some improvement - Agitation with any sedation wean - Will benefit from MRI brain if still nothing purposeful on sedation weans, plan on doing this tomorrow  Acute Hypoxemic Respiratory Failure 2/2 to Cardiac Arrest, Pulmonary Edema.  - Mental status precludes extubation at this time - Vent support, VAP bundle  Persistent hypoglycemia - Check cortisol - TF + D10   Best practice:  Diet: TF Pain/Anxiety/Delirium protocol (if indicated): wean VAP protocol (if indicated): yes DVT prophylaxis: lovenox GI prophylaxis: PPI Glucose control: SSI Mobility: BR Code Status: FULL  Family Communication: will update when they come in Disposition: ICU    The patient is critically ill with multiple organ systems failure and requires high complexity decision making for assessment and support, frequent evaluation and titration of therapies, application of advanced monitoring technologies and extensive interpretation of multiple databases. Critical Care Time devoted to patient care services described in this note independent of APP/resident time (if applicable)  is 34 minutes.   Myrla Halsted MD [facility] 09/27/2019 9:09 AM Personal pager: [phone] If unanswered, please page CCM On-call: [phone]

## 2019-09-27 NOTE — Progress Notes (Signed)
eLink Physician-Brief Progress Note Patient Name: Beverly [name] DOB: 17-Apr-1985 MRN: [id number]   Date of Service  09/27/2019  HPI/Events of Note  Hypoglycemia - Blood glucose = 83 --> 74 --> 72. Tube feeds at goal.   eICU Interventions  Plan: 1. D10W to run IV at 20 mL/hour.      Intervention Category Major Interventions: Other:  Lenell Antu 09/27/2019, 5:45 AM

## 2019-09-28 ENCOUNTER — Inpatient Hospital Stay (HOSPITAL_COMMUNITY): Payer: Medicaid Other

## 2019-09-28 DIAGNOSIS — I469 Cardiac arrest, cause unspecified: Secondary | ICD-10-CM | POA: Diagnosis not present

## 2019-09-28 DIAGNOSIS — G931 Anoxic brain damage, not elsewhere classified: Secondary | ICD-10-CM

## 2019-09-28 LAB — CBC
HCT: 29.3 % — ABNORMAL LOW (ref 36.0–46.0)
Hemoglobin: 8.8 g/dL — ABNORMAL LOW (ref 12.0–15.0)
MCH: 26.2 pg (ref 26.0–34.0)
MCHC: 30 g/dL (ref 30.0–36.0)
MCV: 87.2 fL (ref 80.0–100.0)
Platelets: 655 10*3/uL — ABNORMAL HIGH (ref 150–400)
RBC: 3.36 MIL/uL — ABNORMAL LOW (ref 3.87–5.11)
RDW: 17.1 % — ABNORMAL HIGH (ref 11.5–15.5)
WBC: 7.9 10*3/uL (ref 4.0–10.5)
nRBC: 0 % (ref 0.0–0.2)

## 2019-09-28 LAB — BASIC METABOLIC PANEL
Anion gap: 9 (ref 5–15)
BUN: 14 mg/dL (ref 6–20)
CO2: 27 mmol/L (ref 22–32)
Calcium: 8.9 mg/dL (ref 8.9–10.3)
Chloride: 106 mmol/L (ref 98–111)
Creatinine, Ser: 0.45 mg/dL (ref 0.44–1.00)
GFR calc Af Amer: >60 mL/min (ref >60)
GFR calc non Af Amer: >60 mL/min (ref >60)
Glucose, Bld: 126 mg/dL — ABNORMAL HIGH (ref 70–99)
Potassium: 3.4 mmol/L — ABNORMAL LOW (ref 3.5–5.1)
Sodium: 142 mmol/L (ref 135–145)

## 2019-09-28 LAB — GLUCOSE, CAPILLARY
Glucose-Capillary: 112 mg/dL — ABNORMAL HIGH (ref 70–99)
Glucose-Capillary: 100 mg/dL — ABNORMAL HIGH (ref 70–99)
Glucose-Capillary: 108 mg/dL — ABNORMAL HIGH (ref 70–99)
Glucose-Capillary: 96 mg/dL (ref 70–99)
Glucose-Capillary: 95 mg/dL (ref 70–99)
Glucose-Capillary: 78 mg/dL (ref 70–99)

## 2019-09-28 LAB — MAGNESIUM: Magnesium: 1.8 mg/dL (ref 1.7–2.4)

## 2019-09-28 LAB — TRIGLYCERIDES: Triglycerides: 213 mg/dL — ABNORMAL HIGH (ref <150)

## 2019-09-28 MED ORDER — MAGNESIUM SULFATE 2 GM/50ML IV SOLN
2.0000 g | Freq: Once | INTRAVENOUS | Status: AC
  Administered 2019-09-28: 2 g via INTRAVENOUS
  Filled 2019-09-28: qty 50

## 2019-09-28 MED ORDER — POTASSIUM CHLORIDE 20 MEQ/15ML (10%) PO SOLN
40.0000 meq | Freq: Once | ORAL | Status: AC
  Administered 2019-09-28: 40 meq
  Filled 2019-09-28: qty 30

## 2019-09-28 MED ORDER — PROPRANOLOL HCL 20 MG PO TABS
20.0000 mg | ORAL_TABLET | Freq: Two times a day (BID) | ORAL | Status: DC
  Administered 2019-09-28 – 2019-09-29 (×2): 20 mg via ORAL
  Filled 2019-09-28 (×2): qty 1

## 2019-09-28 MED ORDER — SODIUM CHLORIDE 0.9 % IV BOLUS
250.0000 mL | Freq: Once | INTRAVENOUS | Status: AC
  Administered 2019-09-28: 250 mL via INTRAVENOUS

## 2019-09-28 NOTE — Progress Notes (Signed)
Assisted tele visit to patient with mother.  Maudry Zeidan DNP eLink RN

## 2019-09-28 NOTE — Progress Notes (Signed)
ADVANCED HF PROGRESS  Patient Name: Beverly [name] Date of Encounter: 09/28/2019  [facility] Cardiologist: No primary care provider on file.   Subjective   Remains on TTM for normothermia at 37 degrees.  Remains unresponsive on vent. Had recurrent seizures/posturing this am. Given versed. Brain MRI pending.   Co-ox 72% CVP 2-3     Inpatient Medications    Scheduled Meds: . acetaminophen (TYLENOL) oral liquid 160 mg/5 mL  650 mg Per Tube Q4H  . aspirin  81 mg Per Tube Daily  . atorvastatin  80 mg Per Tube Daily  . carvedilol  6.25 mg Per Tube BID  . chlorhexidine gluconate (MEDLINE KIT)  15 mL Mouth Rinse BID  . Chlorhexidine Gluconate Cloth  6 each Topical Daily  . digoxin  0.125 mg Oral Daily  . docusate  100 mg Oral BID  . enoxaparin (LOVENOX) injection  40 mg Subcutaneous Q24H  . feeding supplement (PROSource TF)  90 mL Per Tube TID  . feeding supplement (VITAL HIGH PROTEIN)  1,000 mL Per Tube Q24H  . insulin aspart  0-15 Units Subcutaneous Q4H  . losartan  25 mg Per Tube Daily  . mouth rinse  15 mL Mouth Rinse 10 times per day  . pantoprazole (PROTONIX) IV  40 mg Intravenous Q24H  . polyethylene glycol  17 g Oral Daily  . sodium chloride flush  10-40 mL Intracatheter Q12H  . spironolactone  12.5 mg Oral Daily   Continuous Infusions: . sodium chloride 10 mL/hr at 09/27/19 2300  . cefTRIAXone (ROCEPHIN)  IV 2 g (09/27/19 2217)  . dextrose 20 mL/hr at 09/28/19 0841  . levETIRAcetam 1,500 mg (09/27/19 2152)  . magnesium sulfate bolus IVPB 2 g (09/28/19 0838)  . propofol (DIPRIVAN) infusion 10 mcg/kg/min (09/28/19 0738)   PRN Meds: fentaNYL (SUBLIMAZE) injection, midazolam, sodium chloride flush   Vital Signs    Vitals:   09/28/19 0630 09/28/19 0645 09/28/19 0700 09/28/19 0808  BP: 138/83 (!) 136/98 (!) 136/99   Pulse: (!) 107 (!) 106 (!) 101   Resp: (!) 25 15 15 12   Temp: 98.8 F (37.1 C) 99.1 F (37.3 C) 99.5 F (37.5 C)   TempSrc:      SpO2: 100%  100% 100%   Weight:      Height:        Intake/Output Summary (Last 24 hours) at 09/28/2019 0915 Last data filed at 09/28/2019 0900 Gross per 24 hour  Intake 2337.53 ml  Output 1170 ml  Net 1167.53 ml   Last 3 Weights 09/28/2019 09/27/2019 09/26/2019  Weight (lbs) 221 lb 6.5 oz 218 lb 15.7 oz 230 lb 6.1 oz  Weight (kg) 100.43 kg 99.33 kg 104.5 kg  Some encounter information is confidential and restricted. Go to Review Flowsheets activity to see all data.      Telemetry    Sinus 90-110Personally reviewed   Physical Exam   General: Intubated/unresponsive HEENT: normal Neck: supple. no JVD. Carotids 2+ bilat; no bruits. No lymphadenopathy or thryomegaly appreciated. Cor: PMI nondisplaced. Regular mildly tachy . No rubs, gallops or murmurs. Lungs: clear Abdomen: soft, nontender, nondistended. No hepatosplenomegaly. No bruits or masses. Good bowel sounds. Extremities: no cyanosis, clubbing, rash, edema Neuro: unresponsive   Labs    High Sensitivity Troponin:   Recent Labs  Lab 09/23/19 1539 09/23/19 1816 09/24/19 0039 09/24/19 0826  TROPONINIHS 127* 2,078* 24,421* >27,000*      Chemistry Recent Labs  Lab 09/24/19 4174 09/24/19 0057 09/25/19 0325 09/26/19 0814  09/27/19 0321 09/27/19 0642 09/28/19 0227  NA 137  137   < > 136   < > 141 142 142  K 2.7*  2.7*   < > 3.8   < > 3.7 3.5 3.4*  CL 101  102   < > 101   < > 104 104 106  CO2 21*  21*   < > 21*   < > 24 25 27   GLUCOSE 115*  114*   < > 83   < > 95 111* 126*  BUN 8  8   < > 10   < > 17 17 14   CREATININE 0.88  0.94   < > 0.74   < > 0.72 0.66 0.45  CALCIUM 8.0*  8.0*   < > 8.9   < > 9.2 9.0 8.9  PROT 6.9  --  6.9  --  6.6  --   --   ALBUMIN 2.7*  --  2.7*  --  2.4*  --   --   AST 200*  --  151*  --  41  --   --   ALT 69*  --  64*  --  40  --   --   ALKPHOS 99  --  95  --  84  --   --   BILITOT 0.6  --  0.7  --  0.6  --   --   GFRNONAA >60  >60   < > >60   < > >60 >60 >60  GFRAA >60  >60   < > >60    < > >60 >60 >60  ANIONGAP 15  14   < > 14   < > 13 13 9    < > = values in this interval not displayed.     Hematology Recent Labs  Lab 09/26/19 0427 09/27/19 0328 09/28/19 0227  WBC 7.8 8.6 7.9  RBC 3.78* 3.79* 3.36*  HGB 9.9* 9.8* 8.8*  HCT 32.7* 33.2* 29.3*  MCV 86.5 87.6 87.2  MCH 26.2 25.9* 26.2  MCHC 30.3 29.5* 30.0  RDW 16.9* 17.1* 17.1*  PLT 726* 679* 655*    BNP Recent Labs  Lab 09/23/19 1539 09/24/19 0039  BNP 72.4 250.4*     DDimer No results for input(s): DDIMER in the last 168 hours.   Radiology    No results found.  Cardiac Studies   See section above (reviewed)  Patient Profile     34 y.o. female 2 weeks post-partum with VF/torsades arrest in the setting of anterior MI  Assessment & Plan    1. VF arrest (in-hospital): in setting of anterior infarct. Rhythm has stabilized, off amiodarone, no significant arrhythmia on continuous telemetry. Continue beta-blockade.  - Now rewarmed but not waking up. Concern for anoxic injury - EEG 8/20 diffuse slowing but improved - More seizures/posturing this am. Brain MRI pending. Prognosis very concerning  2. Acute systolic heart failure: secondary to anterior MI, LVEF 35%.  - On carvedilol 6.25 bid, losartan 25, dig 0.125 and spiro 12.5 - Co-ox 71% - CVP 2-3 can give back some fluid if needed 3. CAD/Anterior MI: angio reviewed again. Appears to have aneurysmal proximal LAD with dissection, likely culprit. Had TIMI-3 flow at cath but large infarct. Would anticipate relook catheterization once she recovers, likely medical therapy but will need to reassess.  - Continue ASA,  beta-blocker and statin.  4. Encephalopathy, post-arrest. - plan as above 5. Hypokalemia/hypomag - continue to [place] Performed  by: Glori Bickers  Total critical care time: 35 minutes  Critical care time was exclusive of separately billable procedures and treating other patients.  Critical care was necessary to  treat or prevent imminent or life-threatening deterioration.  Critical care was time spent personally by me (independent of midlevel providers or residents) on the following activities: development of treatment plan with patient and/or surrogate as well as nursing, discussions with consultants, evaluation of patient's response to treatment, examination of patient, obtaining history from patient or surrogate, ordering and performing treatments and interventions, ordering and review of laboratory studies, ordering and review of radiographic studies, pulse oximetry and re-evaluation of patient's condition.    For questions or updates, please contact [place] Please consult [email or web address] for contact info under        Signed, Glori Bickers, MD  09/28/2019, 9:15 AM

## 2019-09-28 NOTE — Progress Notes (Signed)
eLink Physician-Brief Progress Note Patient Name: Beverly [name] DOB: 05-30-1985 MRN: [id number]   Date of Service  09/28/2019  HPI/Events of Note  Oliguria - CVP = 4 and LVEF = 30-35%  eICU Interventions  Plan: 1. Bolus with 0.9 NaCl 250 mL IV over 1 hour.      Intervention Category Major Interventions: Other:  Charma Mocarski Dennard Nip 09/28/2019, 1:19 AM

## 2019-09-28 NOTE — Plan of Care (Signed)
  Problem: Clinical Measurements: Goal: Will remain free from infection Outcome: Progressing Goal: Respiratory complications will improve Outcome: Progressing Goal: Cardiovascular complication will be avoided Outcome: Progressing   Problem: Nutrition: Goal: Adequate nutrition will be maintained Outcome: Progressing   Problem: Skin Integrity: Goal: Risk for impaired skin integrity will decrease Outcome: Progressing

## 2019-09-28 NOTE — Procedures (Signed)
History: 34 year old female with concern for post anoxic encephalopathy  Sedation: Propofol  Technique: This is a 21 channel routine scalp EEG performed at the bedside with bipolar and monopolar montages arranged in accordance to the international 10/20 system of electrode placement. One channel was dedicated to EKG recording.    Background: The beginning of the EEG appears most consistent with a sedated EEG with frontocentrally, runs of alpha range activity.  Following stimulation/reduction of propofol, she does have some generalized irregular delta and theta range activities which become more prominent after stimulation(reactive EEG), but no definite posterior dominant rhythm is seen.  Photic stimulation: Physiologic driving is not performed  EEG Abnormalities: 1) generalized irregular delta and theta range activities 2) absent posterior dominant rhythm  Clinical Interpretation: This EEG is consistent with a generalized nonspecific cerebral dysfunction as can be seen with propofol sedation, anoxic encephalopathy, toxic/metabolic encephalopathy, among other causes.  There was reactivity to external stimuli seen on the study.  There was no seizure or seizure predisposition recorded on this study. Please note that lack of epileptiform activity on EEG does not preclude the possibility of epilepsy.   Ritta Slot, MD [facility] [phone]  If 7pm- 7am, please page neurology on call as listed in AMION.

## 2019-09-28 NOTE — Progress Notes (Signed)
NAME:  Beverly [name], MRN:  [id number], DOB:  Dec 10, 1985, LOS: 4 ADMISSION DATE:  09/24/2019, CONSULTATION DATE:  8/18 REFERRING MD:  Dr. Eldridge Dace, CHIEF COMPLAINT:  VF arrest   Brief History   34 year old female 2 weeks post partum presented with chest pain and suffered VF arrest.    History of present illness   Patient is encephalopathic and/or intubated. Therefore history has been obtained from chart review.  34 year old female with PMH as below, which is significant for HTN and 2 weeks post-partum. She had emergency C-section delivery due to premature rupture of membranes. She did have some gestational hypertension, but her pregnancy was otherwise uncomplicated. On 8/17 she developed dull chest pain shortly after eating, which prompted ED presentation at [facility]. Resolved spontaneously. No associated symptoms. While in ED, she suffered VT > VF arrest with ROSC after defib x 3 and epi x 1. Immediately after arrest ST elevation was noted on EKG, which did improve on repeat. Differential ACS, SCAD, and toresades/metabolic. Electrolytes were replaced. She was taken to the cath lab at [facility]  Where aneurysmal dilation of the ostial and proximal LAD and was transferred to [facility] for ICU admission.   Past Medical History   has a past medical history of Hypertension and Vaginal Pap smear, abnormal.  Significant Hospital Events   8/17 chest pain, VF arrest  Consults:    Procedures:  8/18> Central Line  Significant Diagnostic Tests:  LHC 8/17 > No coronary artery occlusion or critical stenosis. There is aneurysmal dilation of the ostial and proximal LAD with possible ulceration or dissection of uncertain chronicity.  TIMI-3 flow is noted throughout the LAD and its branches. Severely reduced left ventricular systolic function with mid/apical anterior, apical, and apical inferior hypokinesis/akinesis. LV gram 30% EF.  CT head 8/17 > normal  CTA chest 8/17 > no evidence of PE,  moderate severity infiltrates vs ATX, small bilateral pleural effusions. Very small pericaradial effusion.  CT abdomen/pelvis 8/17 > Enlarged, heterogeneous uterus, Small amount of fluid seen in between the right kidney and right lobe of the liver. Echo 8/18> LVEF 30-35%, septal and apical akinesis distal anterior wall an d mid/distal inferior wall hypokinesis hyperdynamic basal function. LV size mildly dilated.    Micro Data:  Blood 8/18 >  Antimicrobials:  Ceftriazone 8/18 > Azithromycin 8/18 >8/18  Interim history/subjective:  Remains poorly responsive. On PS. Posturing vs. Seizure like activity this am given versed.  Objective   Blood pressure (!) 136/98, pulse (!) 106, temperature 99.1 F (37.3 C), resp. rate 15, height 5\' 10"  (1.778 m), weight 100.4 kg, SpO2 100 %, unknown if currently breastfeeding. CVP:  [0 mmHg-24 mmHg] 18 mmHg  Vent Mode: PRVC FiO2 (%):  [40 %] 40 % Set Rate:  [15 bmp] 15 bmp Vt Set:  [520 mL] 520 mL PEEP:  [5 cmH20] 5 cmH20 Pressure Support:  [5 cmH20-8 cmH20] 8 cmH20 Plateau Pressure:  [14 cmH20-17 cmH20] 17 cmH20   Intake/Output Summary (Last 24 hours) at 09/28/2019 0705 Last data filed at 09/28/2019 0600 Gross per 24 hour  Intake 2804.08 ml  Output 1095 ml  Net 1709.08 ml   Filed Weights   09/26/19 0400 09/27/19 0500 09/28/19 0332  Weight: 104.5 kg 99.3 kg 100.4 kg    Examination: GEN: sedated woman on vent HEENT: ETT in place, mild thick secretions CV: RRR, ext warm PULM: Scattered rhonci and wheezing, triggering vent GI: soft, hypoactive BS EXT: no edema NEURO:  No corneals Pupils are reactive Doll's eyes present Mild shoulder shrug nonlocalizing to pain Triggers vent, has weak cough/gag PSYCH: cannot assess SKIN: no rashes  Persistent hypoglycemia now on d10 gtt TG a bit up BMP stable Stable thrombocythemia  Resolved Hospital Problem list   Hypokalemia  Assessment & Plan:   VF Arrest New Onset Post Partum  Cardiomyopathy HFrEF - HF management and diuretics per cardiology  - TTM to keep normothermic 37 F -  MRI Brain today  Acute Hypoxemic Respiratory Failure 2/2 to Cardiac Arrest, Pulmonary Edema.  - Mental status precludes extubation at this time - Vent support, VAP bundle  Persistent hypoglycemia- cortisol okay - TF + D10   Best practice:  Diet: TF Pain/Anxiety/Delirium protocol (if indicated): wean VAP protocol (if indicated): yes DVT prophylaxis: lovenox GI prophylaxis: PPI Glucose control: SSI Mobility: BR Code Status: FULL  Family Communication: updated mother at bedside 8/21 Disposition: ICU    The patient is critically ill with multiple organ systems failure and requires high complexity decision making for assessment and support, frequent evaluation and titration of therapies, application of advanced monitoring technologies and extensive interpretation of multiple databases. Critical Care Time devoted to patient care services described in this note independent of APP/resident time (if applicable)  is 38 minutes.   Myrla Halsted MD [facility] 09/28/2019 7:05 AM Personal pager: [phone] If unanswered, please page CCM On-call: #[phone]

## 2019-09-28 NOTE — Progress Notes (Signed)
EEG complete - results pending 

## 2019-09-29 ENCOUNTER — Inpatient Hospital Stay (HOSPITAL_COMMUNITY): Payer: Medicaid Other

## 2019-09-29 DIAGNOSIS — I4901 Ventricular fibrillation: Secondary | ICD-10-CM

## 2019-09-29 DIAGNOSIS — Z9911 Dependence on respirator [ventilator] status: Secondary | ICD-10-CM

## 2019-09-29 DIAGNOSIS — G9341 Metabolic encephalopathy: Secondary | ICD-10-CM | POA: Diagnosis present

## 2019-09-29 DIAGNOSIS — D649 Anemia, unspecified: Secondary | ICD-10-CM

## 2019-09-29 DIAGNOSIS — J9690 Respiratory failure, unspecified, unspecified whether with hypoxia or hypercapnia: Secondary | ICD-10-CM | POA: Diagnosis present

## 2019-09-29 LAB — BASIC METABOLIC PANEL
Anion gap: 9 (ref 5–15)
BUN: 12 mg/dL (ref 6–20)
CO2: 30 mmol/L (ref 22–32)
Calcium: 8.9 mg/dL (ref 8.9–10.3)
Chloride: 105 mmol/L (ref 98–111)
Creatinine, Ser: 0.49 mg/dL (ref 0.44–1.00)
GFR calc Af Amer: >60 mL/min (ref >60)
GFR calc non Af Amer: >60 mL/min (ref >60)
Glucose, Bld: 97 mg/dL (ref 70–99)
Potassium: 4 mmol/L (ref 3.5–5.1)
Sodium: 144 mmol/L (ref 135–145)

## 2019-09-29 LAB — COOXEMETRY PANEL
Carboxyhemoglobin: 0.8 % (ref 0.5–1.5)
Methemoglobin: 0.8 % (ref 0.0–1.5)
O2 Saturation: 66.2 %
Total hemoglobin: 9.6 g/dL — ABNORMAL LOW (ref 12.0–16.0)

## 2019-09-29 LAB — URINALYSIS, ROUTINE W REFLEX MICROSCOPIC
Bilirubin Urine: NEGATIVE
Glucose, UA: NEGATIVE mg/dL
Hgb urine dipstick: NEGATIVE
Ketones, ur: 20 mg/dL — AB
Leukocytes,Ua: NEGATIVE
Nitrite: NEGATIVE
Protein, ur: NEGATIVE mg/dL
Specific Gravity, Urine: 1.032 — ABNORMAL HIGH (ref 1.005–1.030)
pH: 5 (ref 5.0–8.0)

## 2019-09-29 LAB — MAGNESIUM: Magnesium: 1.9 mg/dL (ref 1.7–2.4)

## 2019-09-29 LAB — CULTURE, BLOOD (ROUTINE X 2)
Culture: NO GROWTH
Special Requests: ADEQUATE

## 2019-09-29 LAB — CBC
HCT: 31.6 % — ABNORMAL LOW (ref 36.0–46.0)
Hemoglobin: 9.3 g/dL — ABNORMAL LOW (ref 12.0–15.0)
MCH: 25.5 pg — ABNORMAL LOW (ref 26.0–34.0)
MCHC: 29.4 g/dL — ABNORMAL LOW (ref 30.0–36.0)
MCV: 86.8 fL (ref 80.0–100.0)
Platelets: 643 10*3/uL — ABNORMAL HIGH (ref 150–400)
RBC: 3.64 MIL/uL — ABNORMAL LOW (ref 3.87–5.11)
RDW: 17.1 % — ABNORMAL HIGH (ref 11.5–15.5)
WBC: 9 10*3/uL (ref 4.0–10.5)
nRBC: 0 % (ref 0.0–0.2)

## 2019-09-29 LAB — POCT I-STAT 7, (LYTES, BLD GAS, ICA,H+H)
Acid-Base Excess: 6 mmol/L — ABNORMAL HIGH (ref 0.0–2.0)
Bicarbonate: 31.4 mmol/L — ABNORMAL HIGH (ref 20.0–28.0)
Calcium, Ion: 1.23 mmol/L (ref 1.15–1.40)
HCT: 27 % — ABNORMAL LOW (ref 36.0–46.0)
Hemoglobin: 9.2 g/dL — ABNORMAL LOW (ref 12.0–15.0)
O2 Saturation: 99 %
Patient temperature: 99.5
Potassium: 3.5 mmol/L (ref 3.5–5.1)
Sodium: 142 mmol/L (ref 135–145)
TCO2: 33 mmol/L — ABNORMAL HIGH (ref 22–32)
pCO2 arterial: 47.4 mmHg (ref 32.0–48.0)
pH, Arterial: 7.43 (ref 7.350–7.450)
pO2, Arterial: 121 mmHg — ABNORMAL HIGH (ref 83.0–108.0)

## 2019-09-29 LAB — GLUCOSE, CAPILLARY
Glucose-Capillary: 88 mg/dL (ref 70–99)
Glucose-Capillary: 88 mg/dL (ref 70–99)
Glucose-Capillary: 86 mg/dL (ref 70–99)
Glucose-Capillary: 99 mg/dL (ref 70–99)
Glucose-Capillary: 110 mg/dL — ABNORMAL HIGH (ref 70–99)
Glucose-Capillary: 105 mg/dL — ABNORMAL HIGH (ref 70–99)

## 2019-09-29 LAB — EXPECTORATED SPUTUM ASSESSMENT W GRAM STAIN, RFLX TO RESP C: Special Requests: NORMAL

## 2019-09-29 LAB — PROCALCITONIN: Procalcitonin: 0.64 ng/mL

## 2019-09-29 MED ORDER — FREE WATER
200.0000 mL | Status: DC
  Administered 2019-09-29 – 2019-10-01 (×11): 200 mL

## 2019-09-29 MED ORDER — POLYETHYLENE GLYCOL 3350 17 G PO PACK
17.0000 g | PACK | Freq: Every day | ORAL | Status: DC
  Administered 2019-10-11: 17 g
  Filled 2019-09-29 (×3): qty 1

## 2019-09-29 MED ORDER — HYDRALAZINE HCL 20 MG/ML IJ SOLN
10.0000 mg | INTRAMUSCULAR | Status: DC | PRN
  Filled 2019-09-29 (×2): qty 1

## 2019-09-29 MED ORDER — SACUBITRIL-VALSARTAN 49-51 MG PO TABS
1.0000 | ORAL_TABLET | Freq: Two times a day (BID) | ORAL | Status: DC
  Administered 2019-09-29: 1 via ORAL
  Filled 2019-09-29: qty 1

## 2019-09-29 MED ORDER — PRENATAL MULTIVITAMIN CH
1.0000 | ORAL_TABLET | Freq: Every day | ORAL | Status: DC
  Administered 2019-09-30 – 2019-11-28 (×59): 1
  Filled 2019-09-29 (×61): qty 1

## 2019-09-29 MED ORDER — SACUBITRIL-VALSARTAN 49-51 MG PO TABS
1.0000 | ORAL_TABLET | Freq: Two times a day (BID) | ORAL | Status: DC
  Administered 2019-09-29 – 2019-10-01 (×6): 1
  Filled 2019-09-29 (×6): qty 1

## 2019-09-29 MED ORDER — DOCUSATE SODIUM 50 MG/5ML PO LIQD
100.0000 mg | Freq: Two times a day (BID) | ORAL | Status: DC
  Administered 2019-09-30 – 2019-10-12 (×16): 100 mg
  Filled 2019-09-29 (×19): qty 10

## 2019-09-29 MED ORDER — VANCOMYCIN HCL IN DEXTROSE 1-5 GM/200ML-% IV SOLN
1000.0000 mg | Freq: Three times a day (TID) | INTRAVENOUS | Status: DC
  Administered 2019-09-30 – 2019-10-01 (×4): 1000 mg via INTRAVENOUS
  Filled 2019-09-29 (×6): qty 200

## 2019-09-29 MED ORDER — VITAL AF 1.2 CAL PO LIQD
1000.0000 mL | ORAL | Status: DC
  Administered 2019-09-29 – 2019-10-01 (×3): 1000 mL

## 2019-09-29 MED ORDER — FREE WATER: 250.0000 mL | Status: DC

## 2019-09-29 MED ORDER — SPIRONOLACTONE 25 MG PO TABS
25.0000 mg | ORAL_TABLET | Freq: Every day | ORAL | Status: DC
  Administered 2019-09-29: 25 mg via ORAL
  Filled 2019-09-29: qty 1

## 2019-09-29 MED ORDER — SODIUM CHLORIDE 0.9 % IV SOLN
2.0000 g | Freq: Three times a day (TID) | INTRAVENOUS | Status: AC
  Administered 2019-09-29 – 2019-10-05 (×19): 2 g via INTRAVENOUS
  Filled 2019-09-29 (×19): qty 2

## 2019-09-29 MED ORDER — CARVEDILOL 6.25 MG PO TABS
6.2500 mg | ORAL_TABLET | Freq: Two times a day (BID) | ORAL | Status: DC
  Administered 2019-09-29 – 2019-11-28 (×115): 6.25 mg
  Filled 2019-09-29 (×87): qty 1
  Filled 2019-09-29 (×2): qty 2
  Filled 2019-09-29 (×28): qty 1

## 2019-09-29 MED ORDER — SPIRONOLACTONE 25 MG PO TABS
25.0000 mg | ORAL_TABLET | Freq: Every day | ORAL | Status: DC
  Administered 2019-09-30 – 2019-11-28 (×61): 25 mg
  Filled 2019-09-29 (×61): qty 1

## 2019-09-29 MED ORDER — PROSOURCE TF PO LIQD
45.0000 mL | Freq: Four times a day (QID) | ORAL | Status: DC
  Administered 2019-09-29 – 2019-10-08 (×29): 45 mL
  Filled 2019-09-29 (×26): qty 45

## 2019-09-29 MED ORDER — VANCOMYCIN HCL 2000 MG/400ML IV SOLN
2000.0000 mg | Freq: Once | INTRAVENOUS | Status: AC
  Administered 2019-09-29: 2000 mg via INTRAVENOUS
  Filled 2019-09-29: qty 400

## 2019-09-29 MED ORDER — DIGOXIN 125 MCG PO TABS
0.1250 mg | ORAL_TABLET | Freq: Every day | ORAL | Status: DC
  Administered 2019-09-30 – 2019-11-28 (×60): 0.125 mg
  Filled 2019-09-29 (×61): qty 1

## 2019-09-29 MED ORDER — MAGNESIUM SULFATE 2 GM/50ML IV SOLN
2.0000 g | Freq: Once | INTRAVENOUS | Status: AC
  Administered 2019-09-29: 2 g via INTRAVENOUS
  Filled 2019-09-29: qty 50

## 2019-09-29 NOTE — Progress Notes (Addendum)
ADVANCED HF PROGRESS  Patient Name: Beverly [name] Date of Encounter: 09/29/2019  [facility] Cardiologist: No primary care provider on file.   Subjective   Remains on TTM for normothermia at 37 degrees. Water temp very low so concerning for underlying fevers.   Remains unresponsive on vent. On propofol 50 currently. Tried to wean sedation this am but BP spiked.  Brain MRI normal  CVP 6-7  Inpatient Medications    Scheduled Meds: . acetaminophen (TYLENOL) oral liquid 160 mg/5 mL  650 mg Per Tube Q4H  . aspirin  81 mg Per Tube Daily  . atorvastatin  80 mg Per Tube Daily  . chlorhexidine gluconate (MEDLINE KIT)  15 mL Mouth Rinse BID  . Chlorhexidine Gluconate Cloth  6 each Topical Daily  . digoxin  0.125 mg Oral Daily  . docusate  100 mg Oral BID  . enoxaparin (LOVENOX) injection  40 mg Subcutaneous Q24H  . feeding supplement (PROSource TF)  90 mL Per Tube TID  . feeding supplement (VITAL HIGH PROTEIN)  1,000 mL Per Tube Q24H  . insulin aspart  0-15 Units Subcutaneous Q4H  . losartan  25 mg Per Tube Daily  . mouth rinse  15 mL Mouth Rinse 10 times per day  . pantoprazole (PROTONIX) IV  40 mg Intravenous Q24H  . polyethylene glycol  17 g Oral Daily  . propranolol  20 mg Oral BID  . sodium chloride flush  10-40 mL Intracatheter Q12H  . spironolactone  12.5 mg Oral Daily   Continuous Infusions: . sodium chloride 10 mL/hr at 09/27/19 2300  . dextrose 20 mL/hr at 09/29/19 0700  . levETIRAcetam 1,500 mg (09/28/19 2159)  . propofol (DIPRIVAN) infusion 50 mcg/kg/min (09/29/19 0845)   PRN Meds: fentaNYL (SUBLIMAZE) injection, midazolam, sodium chloride flush   Vital Signs    Vitals:   09/29/19 0700 09/29/19 0800 09/29/19 0808 09/29/19 0809  BP: (!) 151/95 (!) 144/96  (!) 144/96  Pulse:  96  98  Resp: _0 Temp:   99.7 F (37.6 C)   TempSrc:   Oral   SpO2:  100%  100%  Weight:      Height:        Intake/Output Summary (Last 24 hours) at 09/29/2019  0920 Last data filed at 09/29/2019 0700 Gross per 24 hour  Intake 1560.18 ml  Output 1470 ml  Net 90.18 ml   Last 3 Weights 09/29/2019 09/28/2019 09/27/2019  Weight (lbs) 227 lb 11.8 oz 221 lb 6.5 oz 218 lb 15.7 oz  Weight (kg) 103.3 kg 100.43 kg 99.33 kg  Some encounter information is confidential and restricted. Go to Review Flowsheets activity to see all data.      Telemetry    Sinus 110-120 Personally reviewed   Physical Exam   General: Intubated/unresponsive HEENT: normal + ETT Neck: supple. no JVD. Carotids 2+ bilat; no bruits. No lymphadenopathy or thryomegaly appreciated. Cor: PMI nondisplaced. Regular tachy. No rubs, gallops or murmurs. Lungs: clear Abdomen: soft, nontender, nondistended. No hepatosplenomegaly. No bruits or masses. Good bowel sounds. Extremities: no cyanosis, clubbing, rash, edema Neuro: unresponsive + gag   Labs    High Sensitivity Troponin:   Recent Labs  Lab 09/23/19 1539 09/23/19 1816 09/24/19 0039 09/24/19 0826  TROPONINIHS 127* 2,078* 24,421* >27,000*      Chemistry Recent Labs  Lab 09/24/19 8921 09/24/19 0057 09/25/19 0325 09/26/19 0427 09/27/19 0321 09/27/19 0321 09/27/19 1941 09/28/19 0227 09/29/19 0350  NA 137  137   < >  136   < > 141   < > 142 142 144  K 2.7*  2.7*   < > 3.8   < > 3.7   < > 3.5 3.4* 4.0  CL 101  102   < > 101   < > 104   < > 104 106 105  CO2 21*  21*   < > 21*   < > 24   < > _0 GLUCOSE 115*  114*   < > 83   < > 95   < > 111* 126* 97  BUN 8  8   < > 10   < > 17   < > _1 CREATININE 0.88  0.94   < > 0.74   < > 0.72   < > 0.66 0.45 0.49  CALCIUM 8.0*  8.0*   < > 8.9   < > 9.2   < > 9.0 8.9 8.9  PROT 6.9  --  6.9  --  6.6  --   --   --   --   ALBUMIN 2.7*  --  2.7*  --  2.4*  --   --   --   --   AST 200*  --  151*  --  41  --   --   --   --   ALT 69*  --  64*  --  40  --   --   --   --   ALKPHOS 99  --  95  --  84  --   --   --   --   BILITOT 0.6  --  0.7  --  0.6  --   --   --   --    GFRNONAA >60  >60   < > >60   < > >60   < > >60 >60 >60  GFRAA >60  >60   < > >60   < > >60   < > >60 >60 >60  ANIONGAP 15  14   < > 14   < > 13   < > _2 < > = values in this interval not displayed.     Hematology Recent Labs  Lab 09/27/19 0328 09/28/19 0227 09/29/19 0350  WBC 8.6 7.9 9.0  RBC 3.79* 3.36* 3.64*  HGB 9.8* 8.8* 9.3*  HCT 33.2* 29.3* 31.6*  MCV 87.6 87.2 86.8  MCH 25.9* 26.2 25.5*  MCHC 29.5* 30.0 29.4*  RDW 17.1* 17.1* 17.1*  PLT 679* 655* 643*    BNP Recent Labs  Lab 09/23/19 1539 09/24/19 0039  BNP 72.4 250.4*     DDimer No results for input(s): DDIMER in the last 168 hours.   Radiology    EEG  Result Date: 09/28/2019 Greta Doom, MD     09/28/2019 11:16 AM History: 34 year old female with concern for post anoxic encephalopathy Sedation: Propofol Technique: This is a 21 channel routine scalp EEG performed at the bedside with bipolar and monopolar montages arranged in accordance to the international 10/20 system of electrode placement. One channel was dedicated to EKG recording. Background: The beginning of the EEG appears most consistent with a sedated EEG with frontocentrally, runs of alpha range activity.  Following stimulation/reduction of propofol, she does have some generalized irregular delta and theta range activities which become more prominent after stimulation(reactive EEG), but no definite posterior dominant rhythm is seen. Photic stimulation: Physiologic driving is not  performed EEG Abnormalities: 1) generalized irregular delta and theta range activities 2) absent posterior dominant rhythm Clinical Interpretation: This EEG is consistent with a generalized nonspecific cerebral dysfunction as can be seen with propofol sedation, anoxic encephalopathy, toxic/metabolic encephalopathy, among other causes.  There was reactivity to external stimuli seen on the study. There was no seizure or seizure predisposition recorded on this study.  Please note that lack of epileptiform activity on EEG does not preclude the possibility of epilepsy. Roland Rack, MD [facility] [phone] If 7pm- 7am, please page neurology on call as listed in [place].   MR BRAIN WO CONTRAST  Result Date: 09/29/2019 CLINICAL DATA:  Anoxic brain injury EXAM: MRI HEAD WITHOUT CONTRAST TECHNIQUE: Multiplanar, multiecho pulse sequences of the brain and surrounding structures were obtained without intravenous contrast. COMPARISON:  None. FINDINGS: BRAIN: No acute infarct, acute hemorrhage or extra-axial collection. Normal white matter signal. Normal volume of CSF spaces. No chronic microhemorrhage. Normal midline structures. VASCULAR: Major flow voids are preserved. SKULL AND UPPER CERVICAL SPINE: Normal calvarium and skull base. Visualized upper cervical spine and soft tissues are normal. SINUSES/ORBITS: No paranasal sinus fluid levels or advanced mucosal thickening. No mastoid or middle ear effusion. Normal orbits. IMPRESSION: Normal brain MRI. Electronically Signed   By: Ulyses Jarred M.D.   On: 09/29/2019 03:49   DG Abd Portable 1V  Result Date: 09/29/2019 CLINICAL DATA:  Status post orogastric tube adjustment. EXAM: PORTABLE ABDOMEN - 1 VIEW COMPARISON:  09/23/2019 FINDINGS: An enteric tube has been advanced and now terminates over the distal aspect of the stomach with side hole projecting over the gastric body. No dilated loops of bowel are seen in the included portion of the abdomen to suggest obstruction. A central venous catheter terminates near the superior cavoatrial junction. Mild left basilar lung opacity is incompletely evaluated though may reflect atelectasis. IMPRESSION: Advancement of enteric tube as above. Electronically Signed   By: Logan Bores M.D.   On: 09/29/2019 08:54    Cardiac Studies   See section above (reviewed)  Patient Profile     34 y.o. female 2 weeks post-partum with VF/torsades arrest in the setting of anterior  MI  Assessment & Plan    1. VF arrest (in-hospital): in setting of anterior infarct. Rhythm has stabilized, off amiodarone, no significant arrhythmia on continuous telemetry. Continue beta-blockade.  - Now rewarmed but not waking up. Concern for anoxic injury - EEG 8/20 diffuse slowing but improved - Brain MRI normal. - Will work on lowering BP so we can wean sedation. Will d/w CCM and Neurology 2. Acute systolic heart failure: secondary to anterior MI, LVEF 35%.  - On carvedilol 6.25 bid, losartan 25, dig 0.125 and spiro 12.5 - Co-ox 71% over the weekend. Will repeat - Will switch losartan to Entresto with high pressures - Add PRN hydralazine 3. CAD/Anterior MI: angio reviewed again. Appears to have aneurysmal proximal LAD with dissection, likely culprit. Had TIMI-3 flow at cath but large infarct. Would anticipate relook catheterization once she recovers, likely medical therapy but will need to reassess.  - Continue ASA,  beta-blocker and statin.  4. Encephalopathy, post-arrest. - plan as above 5. Hypokalemia/hypomag - continue to supp 6. Hypertension - changes as above 7. Fevers - ? Neurogenic vs infectious - completes 5/5 days of ceftriaxone today - will pan culture  CRITICAL CARE Performed by: Glori Bickers  Total critical care time: 35 minutes  Critical care time was exclusive of separately billable procedures and treating other patients.  Critical care  was necessary to treat or prevent imminent or life-threatening deterioration.  Critical care was time spent personally by me (independent of midlevel providers or residents) on the following activities: development of treatment plan with patient and/or surrogate as well as nursing, discussions with consultants, evaluation of patient's response to treatment, examination of patient, obtaining history from patient or surrogate, ordering and performing treatments and interventions, ordering and review of laboratory studies,  ordering and review of radiographic studies, pulse oximetry and re-evaluation of patient's condition.    For questions or updates, please contact [place] Please consult [email or web address] for contact info under        Signed, Glori Bickers, MD  09/29/2019, 9:20 AM

## 2019-09-29 NOTE — Progress Notes (Signed)
Assisted daughter with tele-visit via elink 

## 2019-09-29 NOTE — Lactation Note (Signed)
Lactation Consultation Note  Patient Name: Beverly [name] Date: 09/29/2019   RN Liborio Nixon called the [facility] number and left a message to let [facility] know that she couldn't pump any volume out of mother's breasts today. Mom is in critical condition, asked her night shift RN (who picked up the call) to assess if mom is engorged and she reported mom's breasts do not feel hard, they feel soft.   Asked RN to follow up with lactation in case mom needed to be seen, but she voiced mom is not conscious, they're pumping her every 4 hours. Unable to find baby's records but per RN baby is already 80 weeks old and her hospital ED/heart stay since 08/17 might have already impacted her supply.   Maternal Data    Feeding    LATCH Score                   Interventions    Lactation Tools Discussed/Used     Consult Status      Farrell Broerman Venetia Constable 09/29/2019, 8:54 PM

## 2019-09-29 NOTE — Progress Notes (Signed)
Chaplain engaged in follow-up visit with Beverly [name] and her [name].  Beverly [name] shared how hard it has been to see Beverly [name] go back to being sedated and to hear thoughts like, "She should be waking up."  Beverly [name] holds a great amount of faith and hope that Beverly [name] will wake up and be alert.  Chaplain affirmed how hard it can be to hear different reports and ideas but also uplifted the positive MRI results that her nurse shared this morning.  Chaplain prayed with mom around [name] and will continue to follow-up.

## 2019-09-29 NOTE — Progress Notes (Signed)
NAME:  Beverly [name], MRN:  [id number], DOB:  Aug 31, 1985, LOS: 5 ADMISSION DATE:  09/24/2019, CONSULTATION DATE:  8/18 REFERRING MD:  Dr. Eldridge Dace, CHIEF COMPLAINT:  VF arrest   Brief History   34 year old female 2 weeks post partum presented with chest pain and suffered VF arrest.    History of present illness   Patient is encephalopathic and/or intubated. Therefore history has been obtained from chart review.  34 year old female with PMH as below, which is significant for HTN and 2 weeks post-partum. She had emergency C-section delivery due to premature rupture of membranes. She did have some gestational hypertension, but her pregnancy was otherwise uncomplicated. On 8/17 she developed dull chest pain shortly after eating, which prompted ED presentation at [facility]. Resolved spontaneously. No associated symptoms.   Past Medical History   has a past medical history of Hypertension and Vaginal Pap smear, abnormal.  Significant Hospital Events   8/17 chest pain, VF arrest; While in ED, she suffered VT > VF arrest with ROSC after defib x 3 and epi x 1. Immediately after arrest ST elevation was noted on EKG, which did improve on repeat. Differential ACS, SCAD, and toresades/metabolic. Electrolytes were replaced. She was taken to the cath lab at [facility]  Where aneurysmal dilation of the ostial and proximal LAD and was transferred to [place] [facility] for ICU admission.  8/23 possibly febrile. Pan-cultures sent. Still on full support. Gets hypertensive when sedation let up.   Consults:    Procedures:  8/18> Central Line  Significant Diagnostic Tests:  LHC 8/17 > No coronary artery occlusion or critical stenosis. There is aneurysmal dilation of the ostial and proximal LAD with possible ulceration or dissection of uncertain chronicity.  TIMI-3 flow is noted throughout the LAD and its branches. Severely reduced left ventricular systolic function with mid/apical anterior, apical, and apical  inferior hypokinesis/akinesis. LV gram 30% EF.  CT head 8/17 > normal  CTA chest 8/17 > no evidence of PE, moderate severity infiltrates vs ATX, small bilateral pleural effusions. Very small pericaradial effusion.  CT abdomen/pelvis 8/17 > Enlarged, heterogeneous uterus, Small amount of fluid seen in between the right kidney and right lobe of the liver. Echo 8/18> LVEF 30-35%, septal and apical akinesis distal anterior wall an d mid/distal inferior wall hypokinesis hyperdynamic basal function. LV size mildly dilated.  MRI brain 8/23: Normal  Micro Data:  Blood 8/18 > Respiratory 8/23>> UC 8/23>>> Blood 8/23>> Antimicrobials:  Ceftriazone 8/18 > Azithromycin 8/18 >8/18  Interim history/subjective:   Still unresponsive.  Currently on fairly high dose propofol, gets hypertensive when weaned lower Objective   Blood pressure (Abnormal) 144/96, pulse 98, temperature 99.7 F (37.6 C), temperature source Oral, resp. rate 18, height 5\' 10"  (1.778 m), weight 103.3 kg, SpO2 100 %, unknown if currently breastfeeding. CVP:  [1 mmHg-8 mmHg] 6 mmHg  Vent Mode: PRVC FiO2 (%):  [40 %-100 %] 40 % Set Rate:  [15 bmp-155 bmp] 15 bmp Vt Set:  [520 mL] 520 mL PEEP:  [5 cmH20] 5 cmH20 Plateau Pressure:  [9 cmH20-17 cmH20] 9 cmH20   Intake/Output Summary (Last 24 hours) at 09/29/2019 1022 Last data filed at 09/29/2019 0700 Gross per 24 hour  Intake 1398.39 ml  Output 1470 ml  Net -71.61 ml   Filed Weights   09/27/19 0500 09/28/19 0332 09/29/19 0500  Weight: 99.3 kg 100.4 kg 103.3 kg    Examination: General: 34 year old black female currently sedated on propofol infusion HEENT normocephalic  atraumatic no jugular venous distention appreciated orally intubated Pulmonary: Clear to auscultation currently on full support Cardiac: Regular rate and rhythm Abdomen: Soft nontender Neuro: Heavily sedated on propofol GU: Clear yellow Extremities: Warm and dry  Resolved Hospital Problem list     Hypokalemia  Assessment & Plan:   VF Arrest New Onset Post Partum Cardiomyopathy, EF 35% HFrEF Plan Continuing carvedilol 6.25 mg twice daily,  digoxin and spironolactone Heart failure team adding Entresto As needed hydralazine Planning on repeat catheterization recovery from the current illness Continue aspirin and statin  Acute metabolic status post cardiac arrest -Currently MRI negative EEG negative for seizures Plan Trying to wean propofol Arterial blood gas today Supportive care PAD protocol RASS goal 0 Dc cooling, rx temp w/ apap   Acute Hypoxemic Respiratory Failure 2/2 to Cardiac Arrest, Pulmonary Edema.  Portable chest x-ray personally reviewed: Last film was on 8/18: Bilateral patchy infiltrates has not had a film since Mental status is her primary barrier to extubation Plan Chest x-ray now PAD protocol RASS goal 0 to -1 VAP bundle Sputum culture ordered by heart failure team, agree reasonable infection certainly aspiration is a consideration  Possible fever.  Currently on temperature management protocol to ensure normothermia.  However temperature of machine very low suggesting possible masking a fever.  No leukocytosis Plan Blood, sputum, urine all sent  Persistent hypoglycemia- cortisol okay Was placed on D10, glucose still around 80 Plan Continue every 4 hours glucose checks Advance tube feeds Continue D10 for now but hopefully can stop this once tube feeds at goal  Mild anemia without evidence of bleeding Plan Trending CBC Transfuse for hemoglobin less than 7 Best practice:  Diet: TF Pain/Anxiety/Delirium protocol (if indicated): wean VAP protocol (if indicated): yes DVT prophylaxis: lovenox GI prophylaxis: PPI Glucose control: SSI Mobility: BR Code Status: FULL  Family Communication: updated mother at bedside 8/21 Disposition: ICU   My critical care time 35 minutes  Simonne Martinet ACNP-BC [facility] Pulmonary/Critical Care Pager #  [phone] OR # [phone] if no answer

## 2019-09-29 NOTE — Progress Notes (Signed)
Brief nutrition follow-up:  Pt remains on vent support, propofol at 11.1 ml/hr Tolerating Vital High Protein at 45 ml/hr  RN indicating during rounds that pt is lactating and RNs are utilizing breast pump; however milk production is significantly decreased from several days ago. Down to 1 ounce from 10-12 ounces.   Plan to increase free water flush from TF to provide adequate hydration. Adjust TF now that pt receiving less propofol  Interventions:   Add Prenatal MVI daily  Change to Vital AF 1.2 at 65 ml/hr  Pro-Source 45 mL QID  Add Free Water Flush of 200 mL q 4 hours  Provides 161 g of protein, 2032 kcals. Total free water 2463 mL   Romelle Starcher MS, RDN, LDN, CNSC Registered Dietitian III Clinical Nutrition RD Pager and On-Call Pager Number Located in [place]

## 2019-09-29 NOTE — Progress Notes (Signed)
Pharmacy Antibiotic Note  Beverly [name] is a 34 y.o. female admitted on 09/24/2019 2 weeks post-partum with VF/torsades arrest in the setting of anterior MI. Pharmacy has been consulted for vancomycin/cefepime dosing for PNA. Just completed 5 days of ceftriaxone today. Afebrile, WBC wnl. SCr 0.49 stable.  Plan: Cefepime 2g IV q8h Vancomycin 2g IV x1; then 1 mg IV Q 8 hrs Monitor clinical progress, c/s, renal function F/u de-escalation plan/LOT, vancomycin levels as indicated   Height: 5\' 10"  (177.8 cm) Weight: 103.3 kg (227 lb 11.8 oz) IBW/kg (Calculated) : 68.5  Temp (24hrs), Avg:98.3 F (36.8 C), Min:83.3 F (28.5 C), Max:100 F (37.8 C)  Recent Labs  Lab 09/25/19 0325 09/25/19 0325 09/26/19 0427 09/27/19 0321 09/27/19 0328 09/27/19 0642 09/28/19 0227 09/29/19 0350  WBC 12.5*  --  7.8  --  8.6  --  7.9 9.0  CREATININE 0.74   < > 0.59 0.72  --  0.66 0.45 0.49   < > = values in this interval not displayed.    Estimated Creatinine Clearance: 131.3 mL/min (by C-G formula based on SCr of 0.49 mg/dL).    No Known Allergies  Antimicrobials this admission: 8/23 vancomycin >>  8/23 cefepime >>   Dose adjustments this admission:   Microbiology results:   9/23, PharmD, BCPS Please check AMION for all [facility] contact numbers Clinical Pharmacist 09/29/2019 5:59 PM

## 2019-09-30 DIAGNOSIS — J156 Pneumonia due to other aerobic Gram-negative bacteria: Secondary | ICD-10-CM

## 2019-09-30 DIAGNOSIS — I2102 ST elevation (STEMI) myocardial infarction involving left anterior descending coronary artery: Principal | ICD-10-CM

## 2019-09-30 LAB — CBC
HCT: 28.4 % — ABNORMAL LOW (ref 36.0–46.0)
Hemoglobin: 8.5 g/dL — ABNORMAL LOW (ref 12.0–15.0)
MCH: 25.7 pg — ABNORMAL LOW (ref 26.0–34.0)
MCHC: 29.9 g/dL — ABNORMAL LOW (ref 30.0–36.0)
MCV: 85.8 fL (ref 80.0–100.0)
Platelets: 556 10*3/uL — ABNORMAL HIGH (ref 150–400)
RBC: 3.31 MIL/uL — ABNORMAL LOW (ref 3.87–5.11)
RDW: 17.2 % — ABNORMAL HIGH (ref 11.5–15.5)
WBC: 7.9 10*3/uL (ref 4.0–10.5)
nRBC: 0 % (ref 0.0–0.2)

## 2019-09-30 LAB — BASIC METABOLIC PANEL
Anion gap: 10 (ref 5–15)
Anion gap: 7 (ref 5–15)
BUN: 10 mg/dL (ref 6–20)
BUN: 10 mg/dL (ref 6–20)
CO2: 27 mmol/L (ref 22–32)
CO2: 28 mmol/L (ref 22–32)
Calcium: 8.5 mg/dL — ABNORMAL LOW (ref 8.9–10.3)
Calcium: 8.4 mg/dL — ABNORMAL LOW (ref 8.9–10.3)
Chloride: 104 mmol/L (ref 98–111)
Chloride: 106 mmol/L (ref 98–111)
Creatinine, Ser: 0.5 mg/dL (ref 0.44–1.00)
Creatinine, Ser: 0.47 mg/dL (ref 0.44–1.00)
GFR calc Af Amer: >60 mL/min (ref >60)
GFR calc Af Amer: >60 mL/min (ref >60)
GFR calc non Af Amer: >60 mL/min (ref >60)
GFR calc non Af Amer: >60 mL/min (ref >60)
Glucose, Bld: 122 mg/dL — ABNORMAL HIGH (ref 70–99)
Glucose, Bld: 151 mg/dL — ABNORMAL HIGH (ref 70–99)
Potassium: 2.6 mmol/L — CL (ref 3.5–5.1)
Potassium: 3 mmol/L — ABNORMAL LOW (ref 3.5–5.1)
Sodium: 141 mmol/L (ref 135–145)
Sodium: 141 mmol/L (ref 135–145)

## 2019-09-30 LAB — URINE CULTURE
Culture: NO GROWTH
Special Requests: NORMAL

## 2019-09-30 LAB — MAGNESIUM: Magnesium: 1.7 mg/dL (ref 1.7–2.4)

## 2019-09-30 LAB — COOXEMETRY PANEL
Carboxyhemoglobin: 1.3 % (ref 0.5–1.5)
Methemoglobin: 1.4 % (ref 0.0–1.5)
O2 Saturation: 75.9 %
Total hemoglobin: 9.3 g/dL — ABNORMAL LOW (ref 12.0–16.0)

## 2019-09-30 LAB — GLUCOSE, CAPILLARY
Glucose-Capillary: 105 mg/dL — ABNORMAL HIGH (ref 70–99)
Glucose-Capillary: 121 mg/dL — ABNORMAL HIGH (ref 70–99)
Glucose-Capillary: 122 mg/dL — ABNORMAL HIGH (ref 70–99)
Glucose-Capillary: 124 mg/dL — ABNORMAL HIGH (ref 70–99)
Glucose-Capillary: 129 mg/dL — ABNORMAL HIGH (ref 70–99)
Glucose-Capillary: 151 mg/dL — ABNORMAL HIGH (ref 70–99)

## 2019-09-30 MED ORDER — POTASSIUM CHLORIDE 10 MEQ/50ML IV SOLN
10.0000 meq | INTRAVENOUS | Status: AC
  Administered 2019-09-30 (×4): 10 meq via INTRAVENOUS
  Filled 2019-09-30 (×4): qty 50

## 2019-09-30 MED ORDER — MAGNESIUM SULFATE 2 GM/50ML IV SOLN
2.0000 g | Freq: Once | INTRAVENOUS | Status: AC
  Administered 2019-09-30: 2 g via INTRAVENOUS
  Filled 2019-09-30: qty 50

## 2019-09-30 MED ORDER — POTASSIUM CHLORIDE 20 MEQ PO PACK
30.0000 meq | PACK | Freq: Two times a day (BID) | ORAL | Status: AC
  Administered 2019-09-30 (×2): 30 meq
  Filled 2019-09-30 (×2): qty 2

## 2019-09-30 MED ORDER — POTASSIUM CHLORIDE 20 MEQ/15ML (10%) PO SOLN
40.0000 meq | Freq: Once | ORAL | Status: AC
  Administered 2019-09-30: 40 meq
  Filled 2019-09-30: qty 30

## 2019-09-30 NOTE — Progress Notes (Signed)
K 2.6, Mg 1.7 Electrolytes replaced per protocol

## 2019-09-30 NOTE — Progress Notes (Signed)
NAME:  Beverly [name], MRN:  [id number], DOB:  [birth year], LOS: 6 ADMISSION DATE:  09/24/2019, CONSULTATION DATE:  8/18 REFERRING MD:  Dr. Eldridge Dace, CHIEF COMPLAINT:  VF arrest   Brief History   34 year old female 2 weeks post partum presented with chest pain and suffered VF arrest.    History of present illness   Patient is encephalopathic and/or intubated. Therefore history has been obtained from chart review.  34 year old female with PMH as below, which is significant for HTN and 2 weeks post-partum. She had emergency C-section delivery due to premature rupture of membranes. She did have some gestational hypertension, but her pregnancy was otherwise uncomplicated. On 8/17 she developed dull chest pain shortly after eating, which prompted ED presentation at [facility]. Resolved spontaneously. No associated symptoms.   Past Medical History   has a past medical history of Hypertension and Vaginal Pap smear, abnormal.  Significant Hospital Events   8/17 chest pain, VF arrest; While in ED, she suffered VT > VF arrest with ROSC after defib x 3 and epi x 1. Immediately after arrest ST elevation was noted on EKG, which did improve on repeat. Differential ACS, SCAD, and toresades/metabolic. Electrolytes were replaced. She was taken to the cath lab at [facility]  Where aneurysmal dilation of the ostial and proximal LAD and was transferred to [facility] for ICU admission.  8/23 possibly febrile. Pan-cultures sent. Still on full support. Gets hypertensive when sedation let up. abx started later in day when temp >101 8/24 opens eyes to stimulus. + strong cough. Tolerating PSV but mental status still not supporting extubation   Consults:    Procedures:  8/18> Central Line  Significant Diagnostic Tests:  LHC 8/17 > No coronary artery occlusion or critical stenosis. There is aneurysmal dilation of the ostial and proximal LAD with possible ulceration or dissection of uncertain chronicity.  TIMI-3 flow  is noted throughout the LAD and its branches. Severely reduced left ventricular systolic function with mid/apical anterior, apical, and apical inferior hypokinesis/akinesis. LV gram 30% EF.  CT head 8/17 > normal  CTA chest 8/17 > no evidence of PE, moderate severity infiltrates vs ATX, small bilateral pleural effusions. Very small pericaradial effusion.  CT abdomen/pelvis 8/17 > Enlarged, heterogeneous uterus, Small amount of fluid seen in between the right kidney and right lobe of the liver. Echo 8/18> LVEF 30-35%, septal and apical akinesis distal anterior wall an d mid/distal inferior wall hypokinesis hyperdynamic basal function. LV size mildly dilated.  MRI brain 8/23: Normal  Micro Data:  Blood 8/18 > Respiratory 8/23: Few GPC, rare G VR rare Enterobacter>>> UC 8/23>>> negative Blood 8/23>> Antimicrobials:  Ceftriazone 8/18 >completed 5 days Azithromycin 8/18 >8/18 Cefepime 8/23 Vancomycin 8/23 Interim history/subjective:  Currently tolerating pressure support ventilation with tidal volume in the 5-600 range and no accessory use  Objective   Blood pressure 134/75, pulse (Abnormal) 105, temperature 99.7 F (37.6 C), resp. rate 18, height 5\' 10"  (1.778 m), weight 103.3 kg, SpO2 97 %, unknown if currently breastfeeding. CVP:  [2 mmHg-6 mmHg] 6 mmHg  Vent Mode: PSV;CPAP FiO2 (%):  [40 %] 40 % Set Rate:  [15 bmp] 15 bmp Vt Set:  [520 mL] 520 mL PEEP:  [5 cmH20] 5 cmH20 Pressure Support:  [8 cmH20] 8 cmH20 Plateau Pressure:  [14 cmH20-16 cmH20] 16 cmH20   Intake/Output Summary (Last 24 hours) at 09/30/2019 1118 Last data filed at 09/30/2019 1000 Gross per 24 hour  Intake 2259.86 ml  Output 2665 ml  Net -405.14 ml   Filed Weights   09/28/19 0332 09/29/19 0500 09/30/19 0400  Weight: 100.4 kg 103.3 kg 103.3 kg    Examination: General 34 year old black female currently off sedation, not awake but tolerating pressure support ventilation.  Opens eyes to noxious stimulus and cough  only  HEENT normocephalic atraumatic mucous membranes moist pupils equal and reactive  Pulmonary coarse scattered rhonchi no accessory use on pressure support ventilation  Cardiac mildly tachycardic rhythm no murmur rub or gallop  Abdomen soft nontender  Extremities are warm, positive edema in upper and lower extremities capillary refill brisk  GU clear yellow  Neuro still unresponsive but does open eyes to cough and painful stimulus not moving extremities  Resolved Hospital Problem list   Hypokalemia  Assessment & Plan:   VF Arrest New Onset Post Partum Cardiomyopathy, EF 35% HFrEF Plan Continuing carvedilol, Entresto, digoxin and spironolactone at the direction of heart failure team Anticipating starting BiDil in the near future assuming blood pressure remained stable Continue aspirin and statin At some point will require repeat catheterization Continue telemetry monitoring  Acute metabolic status post cardiac arrest -Currently MRI negative EEG negative for seizures; now off sedation Plan Avoiding fever Limit sedation with RASS goal 0 Treat infection  Acute Hypoxemic Respiratory Failure 2/2 to Cardiac Arrest, Pulmonary Edema further complicated by Enterobacter pneumonia -Mental status remains her primary barrier to extubation Plan Continue pressure support ventilation as tolerated VAP bundle PAD protocol with RASS goal 0 Day #2 cefepime and vancomycin, can probably discontinue vancomycin tomorrow assuming no other culture data positive Await final sensitivities to Enterobacter She may require tracheostomy  Fever Likely secondary to pneumonia Plan Continue as needed antipyretics  Persistent hypoglycemia- cortisol okay Was placed on D10, glucose still around 80 Plan Continue D10 at current rate  Continue every 4 hour glucose checks Continue tube feeds   Mild anemia without evidence of bleeding Plan Trending CBC Transfuse for hemoglobin less than 7 Best  practice:  Diet: TF Pain/Anxiety/Delirium protocol (if indicated): wean VAP protocol (if indicated): yes DVT prophylaxis: lovenox GI prophylaxis: PPI Glucose control: SSI Mobility: BR Code Status: FULL  Family Communication: updated mother at bedside 8/21 Disposition: ICU  My critical care time is 31 minutes  Simonne Martinet ACNP-BC [facility] Pulmonary/Critical Care Pager # [phone] OR # [phone] if no answer

## 2019-09-30 NOTE — Plan of Care (Signed)

## 2019-09-30 NOTE — Progress Notes (Addendum)
ADVANCED HF PROGRESS  Patient Name: Beverly [name] Date of Encounter: 09/30/2019  [facility] Cardiologist: No primary care provider on file.   Subjective   Remains intubated and sedated.   Febrile overnight, mTemp 101.1. Pan cultures pending. Remains on empiric vanc + cefepime.   Currently sinus tach on tele, low 100s. Had one 9 beat run of NSVT yesterday. No further recurrence.   K low at 2.6. Mg 1.7.   BP improved today, [id number] systolic.   Co-ox 76%.   Inpatient Medications    Scheduled Meds: . acetaminophen (TYLENOL) oral liquid 160 mg/5 mL  650 mg Per Tube Q4H  . aspirin  81 mg Per Tube Daily  . atorvastatin  80 mg Per Tube Daily  . carvedilol  6.25 mg Per Tube BID WC  . chlorhexidine gluconate (MEDLINE KIT)  15 mL Mouth Rinse BID  . Chlorhexidine Gluconate Cloth  6 each Topical Daily  . digoxin  0.125 mg Per Tube Daily  . docusate  100 mg Per Tube BID  . enoxaparin (LOVENOX) injection  40 mg Subcutaneous Q24H  . feeding supplement (PROSource TF)  45 mL Per Tube QID  . free water  200 mL Per Tube Q4H  . insulin aspart  0-15 Units Subcutaneous Q4H  . mouth rinse  15 mL Mouth Rinse 10 times per day  . pantoprazole (PROTONIX) IV  40 mg Intravenous Q24H  . polyethylene glycol  17 g Per Tube Daily  . prenatal multivitamin  1 tablet Per Tube Q1200  . sacubitril-valsartan  1 tablet Per Tube BID  . sodium chloride flush  10-40 mL Intracatheter Q12H  . spironolactone  25 mg Per Tube Daily   Continuous Infusions: . sodium chloride 10 mL/hr at 09/27/19 2300  . ceFEPime (MAXIPIME) IV Stopped (09/30/19 0236)  . dextrose 10 mL/hr at 09/30/19 0700  . feeding supplement (VITAL AF 1.2 CAL) 1,000 mL (09/29/19 1701)  . levETIRAcetam 1,500 mg (09/29/19 2226)  . potassium chloride 50 mL/hr at 09/30/19 0658  . propofol (DIPRIVAN) infusion Stopped (09/30/19 0603)  . vancomycin Stopped (09/30/19 0455)   PRN Meds: fentaNYL (SUBLIMAZE) injection, hydrALAZINE, midazolam,  sodium chloride flush   Vital Signs    Vitals:   09/30/19 0300 09/30/19 0310 09/30/19 0400 09/30/19 0500  BP: (!) 141/94  (!) 140/91 127/74  Pulse: (!) 113 (!) 114 (!) 111 (!) 111  Resp: _0 Temp: 100.2 F (37.9 C)  100 F (37.8 C) 100.2 F (37.9 C)  TempSrc:   Esophageal   SpO2: 98% 100% 98% 96%  Weight:   103.3 kg   Height:        Intake/Output Summary (Last 24 hours) at 09/30/2019 0715 Last data filed at 09/30/2019 0700 Gross per 24 hour  Intake 2132.13 ml  Output 2565 ml  Net -432.87 ml   Last 3 Weights 09/30/2019 09/29/2019 09/28/2019  Weight (lbs) 227 lb 11.8 oz 227 lb 11.8 oz 221 lb 6.5 oz  Weight (kg) 103.3 kg 103.3 kg 100.43 kg  Some encounter information is confidential and restricted. Go to Review Flowsheets activity to see all data.      Telemetry    Sinus tach low 100s, 1 9 beat run of NSVT 8/23 ~9:30 am. No further recurrence.  Personally reviewed   Physical Exam   General: young female, intubated and sedated.  HEENT: normal + ETT Neck: supple. JVD not well visualized. Carotids 2+ bilat; no bruits. No lymphadenopathy or thryomegaly appreciated. Cor: PMI  nondisplaced. Regular rhythm, mildly tachy. No rubs, gallops or murmurs. Lungs: intubated and clear, no wheezing  Abdomen: mildly obese, soft, nondistended. No hepatosplenomegaly. No bruits or masses. Good bowel sounds. Extremities: no cyanosis, clubbing, rash, edema + Bilateral SCDs  Neuro: intubated and sedated    Labs    High Sensitivity Troponin:   Recent Labs  Lab 09/23/19 1539 09/23/19 1816 09/24/19 0039 09/24/19 0826  TROPONINIHS 127* 2,078* 24,421* >27,000*      Chemistry Recent Labs  Lab 09/24/19 0039 09/24/19 0057 09/25/19 0325 09/26/19 8144 09/27/19 0321 09/27/19 8185 09/28/19 0227 09/28/19 0227 09/29/19 0350 09/29/19 1212 09/30/19 0211  NA 137  137   < > 136   < > 141   < > 142   < > 144 142 141  K 2.7*  2.7*   < > 3.8   < > 3.7   < > 3.4*   < > 4.0 3.5 2.6*    CL 101  102   < > 101   < > 104   < > 106  --  105  --  104  CO2 21*  21*   < > 21*   < > 24   < > 27  --  30  --  27  GLUCOSE 115*  114*   < > 83   < > 95   < > 126*  --  97  --  122*  BUN 8  8   < > 10   < > 17   < > 14  --  12  --  10  CREATININE 0.88  0.94   < > 0.74   < > 0.72   < > 0.45  --  0.49  --  0.50  CALCIUM 8.0*  8.0*   < > 8.9   < > 9.2   < > 8.9  --  8.9  --  8.5*  PROT 6.9  --  6.9  --  6.6  --   --   --   --   --   --   ALBUMIN 2.7*  --  2.7*  --  2.4*  --   --   --   --   --   --   AST 200*  --  151*  --  41  --   --   --   --   --   --   ALT 69*  --  64*  --  40  --   --   --   --   --   --   ALKPHOS 99  --  95  --  84  --   --   --   --   --   --   BILITOT 0.6  --  0.7  --  0.6  --   --   --   --   --   --   GFRNONAA >60  >60   < > >60   < > >60   < > >60  --  >60  --  >60  GFRAA >60  >60   < > >60   < > >60   < > >60  --  >60  --  >60  ANIONGAP 15  14   < > 14   < > 13   < > 9  --  9  --  10   < > = values in this interval not displayed.  Hematology Recent Labs  Lab 09/28/19 0227 09/28/19 0227 09/29/19 0350 09/29/19 1212 09/30/19 0211  WBC 7.9  --  9.0  --  7.9  RBC 3.36*  --  3.64*  --  3.31*  HGB 8.8*   < > 9.3* 9.2* 8.5*  HCT 29.3*   < > 31.6* 27.0* 28.4*  MCV 87.2  --  86.8  --  85.8  MCH 26.2  --  25.5*  --  25.7*  MCHC 30.0  --  29.4*  --  29.9*  RDW 17.1*  --  17.1*  --  17.2*  PLT 655*  --  643*  --  556*   < > = values in this interval not displayed.    BNP Recent Labs  Lab 09/23/19 1539 09/24/19 0039  BNP 72.4 250.4*     DDimer No results for input(s): DDIMER in the last 168 hours.   Radiology    EEG  Result Date: 09/28/2019 Greta Doom, MD     09/28/2019 11:16 AM History: 34 year old female with concern for post anoxic encephalopathy Sedation: Propofol Technique: This is a 21 channel routine scalp EEG performed at the bedside with bipolar and monopolar montages arranged in accordance to the international  10/20 system of electrode placement. One channel was dedicated to EKG recording. Background: The beginning of the EEG appears most consistent with a sedated EEG with frontocentrally, runs of alpha range activity.  Following stimulation/reduction of propofol, she does have some generalized irregular delta and theta range activities which become more prominent after stimulation(reactive EEG), but no definite posterior dominant rhythm is seen. Photic stimulation: Physiologic driving is not performed EEG Abnormalities: 1) generalized irregular delta and theta range activities 2) absent posterior dominant rhythm Clinical Interpretation: This EEG is consistent with a generalized nonspecific cerebral dysfunction as can be seen with propofol sedation, anoxic encephalopathy, toxic/metabolic encephalopathy, among other causes.  There was reactivity to external stimuli seen on the study. There was no seizure or seizure predisposition recorded on this study. Please note that lack of epileptiform activity on EEG does not preclude the possibility of epilepsy. Roland Rack, MD [facility] [phone] If 7pm- 7am, please page neurology on call as listed in [place].   MR BRAIN WO CONTRAST  Result Date: 09/29/2019 CLINICAL DATA:  Anoxic brain injury EXAM: MRI HEAD WITHOUT CONTRAST TECHNIQUE: Multiplanar, multiecho pulse sequences of the brain and surrounding structures were obtained without intravenous contrast. COMPARISON:  None. FINDINGS: BRAIN: No acute infarct, acute hemorrhage or extra-axial collection. Normal white matter signal. Normal volume of CSF spaces. No chronic microhemorrhage. Normal midline structures. VASCULAR: Major flow voids are preserved. SKULL AND UPPER CERVICAL SPINE: Normal calvarium and skull base. Visualized upper cervical spine and soft tissues are normal. SINUSES/ORBITS: No paranasal sinus fluid levels or advanced mucosal thickening. No mastoid or middle ear effusion. Normal orbits.  IMPRESSION: Normal brain MRI. Electronically Signed   By: Ulyses Jarred M.D.   On: 09/29/2019 03:49   DG Chest Port 1 View  Result Date: 09/29/2019 CLINICAL DATA:  Intubated EXAM: PORTABLE CHEST 1 VIEW COMPARISON:  09/24/2019 FINDINGS: Unchanged support apparatus including endotracheal tube, esophagogastric tube and left neck vascular catheter. Interval improvement in diffuse interstitial and heterogeneous airspace opacity. There may be small persistent layering pleural effusions and or atelectasis. Cardiomegaly. IMPRESSION: Interval improvement in diffuse interstitial and heterogeneous airspace opacity. There may be small persistent layering pleural effusions and or atelectasis. No new airspace opacity. Electronically Signed   By: Dorna Bloom.D.  On: 09/29/2019 11:15   DG Abd Portable 1V  Result Date: 09/29/2019 CLINICAL DATA:  Status post orogastric tube adjustment. EXAM: PORTABLE ABDOMEN - 1 VIEW COMPARISON:  09/23/2019 FINDINGS: An enteric tube has been advanced and now terminates over the distal aspect of the stomach with side hole projecting over the gastric body. No dilated loops of bowel are seen in the included portion of the abdomen to suggest obstruction. A central venous catheter terminates near the superior cavoatrial junction. Mild left basilar lung opacity is incompletely evaluated though may reflect atelectasis. IMPRESSION: Advancement of enteric tube as above. Electronically Signed   By: Logan Bores M.D.   On: 09/29/2019 08:54    Cardiac Studies   See section above (reviewed)  Patient Profile     34 y.o. female 2 weeks post-partum with VF/torsades arrest in the setting of anterior MI  Assessment & Plan    1. VF arrest (in-hospital): in setting of anterior infarct. Rhythm has stabilized, off amiodarone, no significant arrhythmia on continuous telemetry. Continue beta-blockade.  - Now rewarmed but not waking up. Concern for anoxic injury - EEG 8/20 diffuse slowing but  improved - Brain MRI normal. - BP now improved. Will wean sedation to try to wean off vent. Will d/w CCM and Neurology - aggressively supp K and Mg 2. Acute systolic heart failure: secondary to anterior MI, LVEF 35%.  - On carvedilol 6.25 bid, Entresto 49-51 bid, dig 0.125 and spiro 25 - Co-ox 76% - plan to add Bidil next if BP allows  3. CAD/Anterior MI: angio reviewed again. Appears to have aneurysmal proximal LAD with dissection, likely culprit. Had TIMI-3 flow at cath but large infarct. Would anticipate relook catheterization once she recovers, likely medical therapy but will need to reassess.  - Continue ASA,  beta-blocker and statin.  4. Encephalopathy, post-arrest. - plan as above 5. Hypokalemia/hypomag - K 2.6 today, Mg 1.7, aggressively supp. Discussed w/ pharmacy  6. Hypertension - improved today on current regimen  7. Fevers - ? Neurogenic vs infectious - mTemp overnight 101.1 - pan cultures pending  - continue empiric vanc + cefepime      Signed, Lyda Jester, PA-C  09/30/2019, 7:15 AM    Agree with above.  Remains intubated. Has been off propofol since 6am. Will open eyes at times. Withdraws to pain. Will not follow commands. Still febrile. Cooling pads off. Co-ox 76% CVP line not hooked up. Supping K.   General:  Intubated. Responds to pain  HEENT: normal + ETT Neck: supple. JVP hard to see Carotids 2+ bilat; no bruits. No lymphadenopathy or thryomegaly appreciated. Cor: PMI nondisplaced. Regular tachy Lungs: clear Abdomen: obese  soft, nontender, nondistended. No hepatosplenomegaly. No bruits or masses. Good bowel sounds. Extremities: no cyanosis, clubbing, rash, tr edema Neuro: withdraws to pain. Will open eyes spontaneously. +gag. Will not follow commands.   Cardiac output stable. I suspect she is mildly volume overloaded. Will re-setup CVP line. Continue current regime. Had brief NSVT yesterday. Supping K.   Neuro status seems to be improving slowly.  Await Neuro input. Continue empiric abx. Follow cultures.   CRITICAL CARE Performed by: Glori Bickers  Total critical care time: 35 minutes  Critical care time was exclusive of separately billable procedures and treating other patients.  Critical care was necessary to treat or prevent imminent or life-threatening deterioration.  Critical care was time spent personally by me (independent of midlevel providers or residents) on the following activities: development of treatment plan with patient and/or surrogate  as well as nursing, discussions with consultants, evaluation of patient's response to treatment, examination of patient, obtaining history from patient or surrogate, ordering and performing treatments and interventions, ordering and review of laboratory studies, ordering and review of radiographic studies, pulse oximetry and re-evaluation of patient's condition.  Glori Bickers, MD  7:49 AM

## 2019-09-30 NOTE — Progress Notes (Signed)
40cc of breast milk pumped through this shift from patient

## 2019-10-01 ENCOUNTER — Inpatient Hospital Stay: Payer: Self-pay

## 2019-10-01 LAB — CBC
HCT: 29 % — ABNORMAL LOW (ref 36.0–46.0)
Hemoglobin: 8.5 g/dL — ABNORMAL LOW (ref 12.0–15.0)
MCH: 25.4 pg — ABNORMAL LOW (ref 26.0–34.0)
MCHC: 29.3 g/dL — ABNORMAL LOW (ref 30.0–36.0)
MCV: 86.6 fL (ref 80.0–100.0)
Platelets: 544 10*3/uL — ABNORMAL HIGH (ref 150–400)
RBC: 3.35 MIL/uL — ABNORMAL LOW (ref 3.87–5.11)
RDW: 17.2 % — ABNORMAL HIGH (ref 11.5–15.5)
WBC: 9.2 10*3/uL (ref 4.0–10.5)
nRBC: 0 % (ref 0.0–0.2)

## 2019-10-01 LAB — GLUCOSE, CAPILLARY
Glucose-Capillary: 105 mg/dL — ABNORMAL HIGH (ref 70–99)
Glucose-Capillary: 131 mg/dL — ABNORMAL HIGH (ref 70–99)
Glucose-Capillary: 123 mg/dL — ABNORMAL HIGH (ref 70–99)
Glucose-Capillary: 121 mg/dL — ABNORMAL HIGH (ref 70–99)
Glucose-Capillary: 108 mg/dL — ABNORMAL HIGH (ref 70–99)

## 2019-10-01 LAB — TRIGLYCERIDES: Triglycerides: 145 mg/dL (ref <150)

## 2019-10-01 LAB — BASIC METABOLIC PANEL
Anion gap: 7 (ref 5–15)
BUN: 9 mg/dL (ref 6–20)
CO2: 26 mmol/L (ref 22–32)
Calcium: 8.4 mg/dL — ABNORMAL LOW (ref 8.9–10.3)
Chloride: 106 mmol/L (ref 98–111)
Creatinine, Ser: 0.45 mg/dL (ref 0.44–1.00)
GFR calc Af Amer: >60 mL/min (ref >60)
GFR calc non Af Amer: >60 mL/min (ref >60)
Glucose, Bld: 152 mg/dL — ABNORMAL HIGH (ref 70–99)
Potassium: 3.4 mmol/L — ABNORMAL LOW (ref 3.5–5.1)
Sodium: 139 mmol/L (ref 135–145)

## 2019-10-01 LAB — MAGNESIUM: Magnesium: 1.8 mg/dL (ref 1.7–2.4)

## 2019-10-01 LAB — CULTURE, RESPIRATORY W GRAM STAIN: Special Requests: NORMAL

## 2019-10-01 LAB — COOXEMETRY PANEL
Carboxyhemoglobin: 1.5 % (ref 0.5–1.5)
Methemoglobin: 1.4 % (ref 0.0–1.5)
O2 Saturation: 87.1 %
Total hemoglobin: 9 g/dL — ABNORMAL LOW (ref 12.0–16.0)

## 2019-10-01 MED ORDER — POTASSIUM CHLORIDE 10 MEQ/50ML IV SOLN
10.0000 meq | INTRAVENOUS | Status: AC
  Administered 2019-10-01 (×4): 10 meq via INTRAVENOUS
  Filled 2019-10-01 (×4): qty 50

## 2019-10-01 MED ORDER — MAGNESIUM SULFATE 2 GM/50ML IV SOLN
2.0000 g | Freq: Once | INTRAVENOUS | Status: AC
  Administered 2019-10-01: 2 g via INTRAVENOUS
  Filled 2019-10-01: qty 50

## 2019-10-01 MED ORDER — ISOSORB DINITRATE-HYDRALAZINE 20-37.5 MG PO TABS
1.0000 | ORAL_TABLET | Freq: Three times a day (TID) | ORAL | Status: DC
  Administered 2019-10-01: 1 via ORAL
  Filled 2019-10-01: qty 1

## 2019-10-01 MED ORDER — ISOSORB DINITRATE-HYDRALAZINE 20-37.5 MG PO TABS: 1.0000 | ORAL_TABLET | Freq: Three times a day (TID) | ORAL | Status: DC

## 2019-10-01 MED ORDER — POTASSIUM CHLORIDE 20 MEQ/15ML (10%) PO SOLN
40.0000 meq | Freq: Once | ORAL | Status: AC
  Administered 2019-10-01: 40 meq via ORAL
  Filled 2019-10-01: qty 30

## 2019-10-01 MED ORDER — FREE WATER
200.0000 mL | Freq: Four times a day (QID) | Status: DC
  Administered 2019-10-01 – 2019-10-11 (×34): 200 mL

## 2019-10-01 NOTE — Progress Notes (Signed)
Assisted tele visit to patient with family member.  Bernetta Sutley McEachran, RN  

## 2019-10-01 NOTE — Plan of Care (Signed)
  Problem: Clinical Measurements: Goal: Cardiovascular complication will be avoided Outcome: Progressing   Problem: Nutrition: Goal: Adequate nutrition will be maintained Outcome: Progressing   Problem: Coping: Goal: Level of anxiety will decrease Outcome: Progressing   Problem: Elimination: Goal: Will not experience complications related to urinary retention Outcome: Progressing   Problem: Pain Managment: Goal: General experience of comfort will improve Outcome: Progressing   Problem: Safety: Goal: Ability to remain free from injury will improve Outcome: Progressing   Problem: Skin Integrity: Goal: Risk for impaired skin integrity will decrease Outcome: Progressing   Problem: Education: Goal: Knowledge of General Education information will improve Description: Including pain rating scale, medication(s)/side effects and non-pharmacologic comfort measures Outcome: Not Progressing   Problem: Health Behavior/Discharge Planning: Goal: Ability to manage health-related needs will improve Outcome: Not Progressing   Problem: Clinical Measurements: Goal: Ability to maintain clinical measurements within normal limits will improve Outcome: Not Progressing Goal: Will remain free from infection Outcome: Not Progressing Goal: Diagnostic test results will improve Outcome: Not Progressing Goal: Respiratory complications will improve Outcome: Not Progressing   Problem: Activity: Goal: Risk for activity intolerance will decrease Outcome: Not Progressing   Problem: Elimination: Goal: Will not experience complications related to bowel motility Outcome: Not Progressing

## 2019-10-01 NOTE — Progress Notes (Signed)
NAME:  Beverly [name], MRN:  [id number], DOB:  1985-09-16, LOS: 7 ADMISSION DATE:  09/24/2019, CONSULTATION DATE:  8/18 REFERRING MD:  Dr. Eldridge Dace, CHIEF COMPLAINT:  VF arrest   Brief History   34 year old female 2 weeks post partum presented with chest pain and suffered VF arrest.    History of present illness   Patient is encephalopathic and/or intubated. Therefore history has been obtained from chart review.  34 year old female with PMH as below, which is significant for HTN and 2 weeks post-partum. She had emergency C-section delivery due to premature rupture of membranes. She did have some gestational hypertension, but her pregnancy was otherwise uncomplicated. On 8/17 she developed dull chest pain shortly after eating, which prompted ED presentation at [facility]. Resolved spontaneously. No associated symptoms.   Past Medical History   has a past medical history of Hypertension and Vaginal Pap smear, abnormal.  Significant Hospital Events   8/17 chest pain, VF arrest; While in ED, she suffered VT > VF arrest with ROSC after defib x 3 and epi x 1. Immediately after arrest ST elevation was noted on EKG, which did improve on repeat. Differential ACS, SCAD, and toresades/metabolic. Electrolytes were replaced. She was taken to the cath lab at [facility]  Where aneurysmal dilation of the ostial and proximal LAD and was transferred to [facility] for ICU admission.  8/23 possibly febrile. Pan-cultures sent. Still on full support. Gets hypertensive when sedation let up. abx started later in day when temp >101 8/24 opens eyes to stimulus. + strong cough. Tolerating PSV but mental status still not supporting extubation  8/25 about the same  Consults:    Procedures:  8/18> Central Line  Significant Diagnostic Tests:  [facility] 8/17 > No coronary artery occlusion or critical stenosis. There is aneurysmal dilation of the ostial and proximal LAD with possible ulceration or dissection of uncertain  chronicity.  TIMI-3 flow is noted throughout the LAD and its branches. Severely reduced left ventricular systolic function with mid/apical anterior, apical, and apical inferior hypokinesis/akinesis. LV gram 30% EF.  CT head 8/17 > normal  CTA chest 8/17 > no evidence of PE, moderate severity infiltrates vs ATX, small bilateral pleural effusions. Very small pericaradial effusion.  CT abdomen/pelvis 8/17 > Enlarged, heterogeneous uterus, Small amount of fluid seen in between the right kidney and right lobe of the liver. Echo 8/18> LVEF 30-35%, septal and apical akinesis distal anterior wall an d mid/distal inferior wall hypokinesis hyperdynamic basal function. LV size mildly dilated.  MRI brain 8/23: Normal  Micro Data:  Blood 8/18 > Respiratory 8/23: Few GPC, rare G VR rare Enterobacter UC 8/23>>> negative Blood 8/23>> Antimicrobials:  Ceftriazone 8/18 >completed 5 days Azithromycin 8/18 >8/18 Cefepime 8/23 Vancomycin 8/23 Interim history/subjective:  No distress   Objective   Blood pressure 130/80, pulse (Abnormal) 104, temperature 99.7 F (37.6 C), resp. rate (Abnormal) 22, height 5\' 10"  (1.778 m), weight 103.3 kg, SpO2 98 %, unknown if currently breastfeeding. CVP:  [7 mmHg] 7 mmHg  Vent Mode: CPAP;PSV FiO2 (%):  [40 %-100 %] 40 % Set Rate:  [15 bmp] 15 bmp Vt Set:  [520 mL] 520 mL PEEP:  [5 cmH20] 5 cmH20 Pressure Support:  [5 cmH20-8 cmH20] 5 cmH20 Plateau Pressure:  [15 cmH20-18 cmH20] 18 cmH20   Intake/Output Summary (Last 24 hours) at 10/01/2019 1039 Last data filed at 10/01/2019 0900 Gross per 24 hour  Intake 2442.36 ml  Output 2840 ml  Net -397.64 ml  Filed Weights   09/28/19 0332 09/29/19 0500 09/30/19 0400  Weight: 100.4 kg 103.3 kg 103.3 kg    Examination: General: 34 year old black female remains minimally responsive on mechanical ventilator HEENT normocephalic atraumatic no jugular venous distention appreciated orally intubated Pulmonary: Clear to  auscultation with occasional rhonchi that improve with suctioning and cough Cardiac: Regular rate and rhythm Abdomen: Soft nontender Neuro: Opens eyes to noxious stimulus, still has cough.  Nursing has seen her spontaneously move right lower extremity and left hand but still not yet following commands GU: Clear yellow Extremities: Dependent edema  Resolved Hospital Problem list   Hypokalemia  Assessment & Plan:   VF Arrest New Onset Post Partum Cardiomyopathy, EF 35% HFrEF Plan Continuing carvedilol, Entresto, digoxin, and spironolactone as directed by heart failure team  Adding BiDil  Continue aspirin and Plavix  Continue telemetry  Eventually will need repeat heart catheterization assuming neurological recovery    Acute metabolic status post cardiac arrest -Currently MRI negative EEG negative for seizures; now off sedation Plan Avoiding fever  Limit sedation  Treat infection   Acute Hypoxemic Respiratory Failure 2/2 to Cardiac Arrest, Pulmonary Edema further complicated by Enterobacter pneumonia (S) to cefepime  Tolerating pressure support ventilation well however mental status precludes extubation still Plan Continue pressure support ventilation  VAP bundle  PAD protocol RASS goal 0  Day #3 cefepime, discontinue vancomycin.  Although the Enterobacter is sensitive to ceftriaxone she had just stopped ceftriaxone approximately a day before spiking fever.  Therefore we will complete a 7-day course of cefepime May require tracheostomy  Fever Likely secondary to pneumonia Plan Continue as needed antipyretics  Fluid electrolyte imbalance: Hypokalemia Plan Replace recheck  Persistent hypoglycemia- cortisol okay Was placed on D10,  Plan Continue glucose checks every 4 Discontinue D10 Tube feeds  Mild anemia without evidence of bleeding Plan Trending CBC Transfuse for hemoglobin less than 7 Best practice:  Diet: TF Pain/Anxiety/Delirium protocol (if indicated):  wean VAP protocol (if indicated): yes DVT prophylaxis: lovenox GI prophylaxis: PPI Glucose control: SSI Mobility: BR Code Status: FULL  Family Communication: updated mother at bedside 8/21 Disposition: ICU  My cct 34 minutes  Simonne Martinet ACNP-BC [facility] Pulmonary/Critical Care Pager # [phone] OR # [phone] if no answer

## 2019-10-01 NOTE — Progress Notes (Addendum)
ADVANCED HF PROGRESS  Patient Name: Beverly [name] Date of Encounter: 10/01/2019  [facility] Cardiologist: No primary care provider on file.   Subjective   Remains intubated. Off sedation. Will open eyes but not tracking or following commands.   No further VT/VF on tele. Currently sinus tach, low 100s.   K 3.4. Mg 1.8.   Co-ox 87%. CVP 7   SBPs 120s-130s. DBPs 90s.   mTemp overnight 100. Remains on empiric vanc + cefepime. BC NGTD. Respiratory cultures growing rare enterobacter aerogenes.    Inpatient Medications    Scheduled Meds: . acetaminophen (TYLENOL) oral liquid 160 mg/5 mL  650 mg Per Tube Q4H  . aspirin  81 mg Per Tube Daily  . atorvastatin  80 mg Per Tube Daily  . carvedilol  6.25 mg Per Tube BID WC  . chlorhexidine gluconate (MEDLINE KIT)  15 mL Mouth Rinse BID  . Chlorhexidine Gluconate Cloth  6 each Topical Daily  . digoxin  0.125 mg Per Tube Daily  . docusate  100 mg Per Tube BID  . enoxaparin (LOVENOX) injection  40 mg Subcutaneous Q24H  . feeding supplement (PROSource TF)  45 mL Per Tube QID  . free water  200 mL Per Tube Q4H  . insulin aspart  0-15 Units Subcutaneous Q4H  . mouth rinse  15 mL Mouth Rinse 10 times per day  . pantoprazole (PROTONIX) IV  40 mg Intravenous Q24H  . polyethylene glycol  17 g Per Tube Daily  . prenatal multivitamin  1 tablet Per Tube Q1200  . sacubitril-valsartan  1 tablet Per Tube BID  . sodium chloride flush  10-40 mL Intracatheter Q12H  . spironolactone  25 mg Per Tube Daily   Continuous Infusions: . sodium chloride 10 mL/hr at 09/27/19 2300  . ceFEPime (MAXIPIME) IV Stopped (10/01/19 0423)  . dextrose 10 mL/hr at 10/01/19 0600  . feeding supplement (VITAL AF 1.2 CAL) 65 mL/hr at 09/30/19 1600  . levETIRAcetam 1,500 mg (09/30/19 2300)  . magnesium sulfate bolus IVPB 2 g (10/01/19 0705)  . potassium chloride 10 mEq (10/01/19 0704)  . propofol (DIPRIVAN) infusion Stopped (09/30/19 0603)  . vancomycin Stopped  (10/01/19 0526)   PRN Meds: fentaNYL (SUBLIMAZE) injection, hydrALAZINE, midazolam, sodium chloride flush   Vital Signs    Vitals:   10/01/19 0407 10/01/19 0500 10/01/19 0600 10/01/19 0700  BP: 127/79 132/83 131/78 129/86  Pulse: 98 (!) 101 (!) 103 (!) 104  Resp: _0 Temp:  99.7 F (37.6 C) 100 F (37.8 C) 99.9 F (37.7 C)  TempSrc:      SpO2: 99% 92% 96% 98%  Weight:      Height:        Intake/Output Summary (Last 24 hours) at 10/01/2019 0719 Last data filed at 10/01/2019 0600 Gross per 24 hour  Intake 2431.45 ml  Output 2915 ml  Net -483.55 ml   Last 3 Weights 09/30/2019 09/29/2019 09/28/2019  Weight (lbs) 227 lb 11.8 oz 227 lb 11.8 oz 221 lb 6.5 oz  Weight (kg) 103.3 kg 103.3 kg 100.43 kg  Some encounter information is confidential and restricted. Go to Review Flowsheets activity to see all data.      Telemetry    Sinus tach low 100s, no further VT/VF.  Personally reviewed   Physical Exam   CVP 7  General: young female, intubated, opens eyes but not following commands HEENT: normal + ETT Neck: supple. JVD not well visualized. Carotids 2+ bilat; no  bruits. No lymphadenopathy or thryomegaly appreciated. Cor: PMI nondisplaced. Regular rhythm, mildly tachy. No rubs, gallops or murmurs. Lungs: intubated, CTAB Abdomen: soft, nondistended. No hepatosplenomegaly. No bruits or masses. Good bowel sounds. Extremities: no cyanosis, clubbing, rash, no edema + Bilateral SCDs  Neuro: intubated, off sedation but not following commands    Labs    High Sensitivity Troponin:   Recent Labs  Lab 09/23/19 1539 09/23/19 1816 09/24/19 0039 09/24/19 0826  TROPONINIHS 127* 2,078* 24,421* >27,000*      Chemistry Recent Labs  Lab 09/25/19 0325 09/26/19 0427 09/27/19 0321 09/27/19 0642 09/30/19 0211 09/30/19 1320 10/01/19 0530  NA 136   < > 141   < > 141 141 139  K 3.8   < > 3.7   < > 2.6* 3.0* 3.4*  CL 101   < > 104   < > 104 106 106  CO2 21*   < > 24   < > _0 GLUCOSE 83   < > 95   < > 122* 151* 152*  BUN 10   < > 17   < > _1 CREATININE 0.74   < > 0.72   < > 0.50 0.47 0.45  CALCIUM 8.9   < > 9.2   < > 8.5* 8.4* 8.4*  PROT 6.9  --  6.6  --   --   --   --   ALBUMIN 2.7*  --  2.4*  --   --   --   --   AST 151*  --  41  --   --   --   --   ALT 64*  --  40  --   --   --   --   ALKPHOS 95  --  84  --   --   --   --   BILITOT 0.7  --  0.6  --   --   --   --   GFRNONAA >60   < > >60   < > >60 >60 >60  GFRAA >60   < > >60   < > >60 >60 >60  ANIONGAP 14   < > 13   < > _2 < > = values in this interval not displayed.     Hematology Recent Labs  Lab 09/29/19 0350 09/29/19 0350 09/29/19 1212 09/30/19 0211 10/01/19 0530  WBC 9.0  --   --  7.9 9.2  RBC 3.64*  --   --  3.31* 3.35*  HGB 9.3*   < > 9.2* 8.5* 8.5*  HCT 31.6*   < > 27.0* 28.4* 29.0*  MCV 86.8  --   --  85.8 86.6  MCH 25.5*  --   --  25.7* 25.4*  MCHC 29.4*  --   --  29.9* 29.3*  RDW 17.1*  --   --  17.2* 17.2*  PLT 643*  --   --  556* 544*   < > = values in this interval not displayed.    BNP No results for input(s): BNP, PROBNP in the last 168 hours.   DDimer No results for input(s): DDIMER in the last 168 hours.   Radiology    DG Chest Port 1 View  Result Date: 09/29/2019 CLINICAL DATA:  Intubated EXAM: PORTABLE CHEST 1 VIEW COMPARISON:  09/24/2019 FINDINGS: Unchanged support apparatus including endotracheal tube, esophagogastric tube and left neck vascular catheter. Interval improvement in diffuse interstitial and heterogeneous  airspace opacity. There may be small persistent layering pleural effusions and or atelectasis. Cardiomegaly. IMPRESSION: Interval improvement in diffuse interstitial and heterogeneous airspace opacity. There may be small persistent layering pleural effusions and or atelectasis. No new airspace opacity. Electronically Signed   By: Eddie Candle M.D.   On: 09/29/2019 11:15   DG Abd Portable 1V  Result Date: 09/29/2019 CLINICAL DATA:   Status post orogastric tube adjustment. EXAM: PORTABLE ABDOMEN - 1 VIEW COMPARISON:  09/23/2019 FINDINGS: An enteric tube has been advanced and now terminates over the distal aspect of the stomach with side hole projecting over the gastric body. No dilated loops of bowel are seen in the included portion of the abdomen to suggest obstruction. A central venous catheter terminates near the superior cavoatrial junction. Mild left basilar lung opacity is incompletely evaluated though may reflect atelectasis. IMPRESSION: Advancement of enteric tube as above. Electronically Signed   By: Logan Bores M.D.   On: 09/29/2019 08:54    Cardiac Studies   See section above (reviewed)  Patient Profile     34 y.o. female 2 weeks post-partum with VF/torsades arrest in the setting of anterior MI  Assessment & Plan    1. VF arrest (in-hospital): in setting of anterior infarct. Rhythm has stabilized, off amiodarone, no significant arrhythmia on continuous telemetry. Continue beta-blockade.  - Now rewarmed and off sedation but not following commands. Concern for anoxic injury - EEG 8/20 diffuse slowing but improved - Brain MRI normal. - Vent management per PCCM - aggressively supp K (3.4) and Mg (1.8) 2. Acute systolic heart failure: secondary to anterior MI, LVEF 35%.  - On carvedilol 6.25 bid, Entresto 49-51 bid, dig 0.125 and spiro 25 - Co-ox 87% - Volume status ok, CVP 7. No loop diuretic requirements currently.   - Further Increase Entresto to 97-103 bid today  - plan to add Bidil next if BP allows  3. CAD/Anterior MI: angio reviewed again. Appears to have aneurysmal proximal LAD with dissection, likely culprit. Had TIMI-3 flow at cath but large infarct. Would anticipate relook catheterization once she recovers, likely medical therapy but will need to reassess.  - Continue ASA,  beta-blocker and statin.  4. Encephalopathy, post-arrest. - plan as above 5. Hypokalemia/hypomag - K 3.4 today, Mg 1.8,  aggressively supp. Discussed w/ pharmacy  6. Hypertension - improved today on current regimen  7. Fevers - ? Neurogenic vs infectious - Low grade fever overnight, mTemp 100  - BC NGTD - Respiratory cultures growing rare enterobacter aerogenes.  - continue empiric vanc + cefepime      Signed, Lyda Jester, PA-C  10/01/2019, 7:19 AM    Agree with above.  Will open eyes but otherwise unresponsive. No further VT. Hemodynamics stable. CVP 7. Co-ox ok.   General:  On vent. Intubated. Opens eyes but won't follow commands  HEENT: normal +ETT Neck: supple. no JVD. Carotids 2+ bilat; no bruits. No lymphadenopathy or thryomegaly appreciated. Cor: PMI nondisplaced. Regular rate & rhythm. No rubs, gallops or murmurs. Lungs: clear Abdomen: soft, nontender, nondistended. No hepatosplenomegaly. No bruits or masses. Good bowel sounds. Extremities: no cyanosis, clubbing, rash, edema Neuro: Opens eyes but won't follow commands + gag  Neuro exam mildly improved but still won't follow commands. Hemodynamically stable. No further VT. Will supp lytes. Continue to follow Neuro status.   D/w CCM.   CRITICAL CARE Performed by: Glori Bickers  Total critical care time: 35 minutes  Critical care time was exclusive of separately billable procedures and treating  other patients.  Critical care was necessary to treat or prevent imminent or life-threatening deterioration.  Critical care was time spent personally by me (independent of midlevel providers or residents) on the following activities: development of treatment plan with patient and/or surrogate as well as nursing, discussions with consultants, evaluation of patient's response to treatment, examination of patient, obtaining history from patient or surrogate, ordering and performing treatments and interventions, ordering and review of laboratory studies, ordering and review of radiographic studies, pulse oximetry and re-evaluation of patient's  condition.  Glori Bickers, MD  8:27 AM

## 2019-10-02 LAB — DIGOXIN LEVEL: Digoxin Level: <0.2 ng/mL — ABNORMAL LOW (ref 0.8–2.0)

## 2019-10-02 LAB — CBC
HCT: 28.2 % — ABNORMAL LOW (ref 36.0–46.0)
Hemoglobin: 8.2 g/dL — ABNORMAL LOW (ref 12.0–15.0)
MCH: 25.1 pg — ABNORMAL LOW (ref 26.0–34.0)
MCHC: 29.1 g/dL — ABNORMAL LOW (ref 30.0–36.0)
MCV: 86.2 fL (ref 80.0–100.0)
Platelets: 524 10*3/uL — ABNORMAL HIGH (ref 150–400)
RBC: 3.27 MIL/uL — ABNORMAL LOW (ref 3.87–5.11)
RDW: 17.3 % — ABNORMAL HIGH (ref 11.5–15.5)
WBC: 7.6 10*3/uL (ref 4.0–10.5)
nRBC: 0 % (ref 0.0–0.2)

## 2019-10-02 LAB — GLUCOSE, CAPILLARY
Glucose-Capillary: 109 mg/dL — ABNORMAL HIGH (ref 70–99)
Glucose-Capillary: 114 mg/dL — ABNORMAL HIGH (ref 70–99)
Glucose-Capillary: 116 mg/dL — ABNORMAL HIGH (ref 70–99)
Glucose-Capillary: 118 mg/dL — ABNORMAL HIGH (ref 70–99)
Glucose-Capillary: 118 mg/dL — ABNORMAL HIGH (ref 70–99)
Glucose-Capillary: 120 mg/dL — ABNORMAL HIGH (ref 70–99)
Glucose-Capillary: 97 mg/dL (ref 70–99)

## 2019-10-02 LAB — BASIC METABOLIC PANEL
Anion gap: 9 (ref 5–15)
Anion gap: 12 (ref 5–15)
BUN: 11 mg/dL (ref 6–20)
BUN: 11 mg/dL (ref 6–20)
CO2: 28 mmol/L (ref 22–32)
CO2: 26 mmol/L (ref 22–32)
Calcium: 8.6 mg/dL — ABNORMAL LOW (ref 8.9–10.3)
Calcium: 8.8 mg/dL — ABNORMAL LOW (ref 8.9–10.3)
Chloride: 104 mmol/L (ref 98–111)
Chloride: 104 mmol/L (ref 98–111)
Creatinine, Ser: 0.42 mg/dL — ABNORMAL LOW (ref 0.44–1.00)
Creatinine, Ser: 0.51 mg/dL (ref 0.44–1.00)
GFR calc Af Amer: >60 mL/min (ref >60)
GFR calc Af Amer: >60 mL/min (ref >60)
GFR calc non Af Amer: >60 mL/min (ref >60)
GFR calc non Af Amer: >60 mL/min (ref >60)
Glucose, Bld: 107 mg/dL — ABNORMAL HIGH (ref 70–99)
Glucose, Bld: 120 mg/dL — ABNORMAL HIGH (ref 70–99)
Potassium: 3.5 mmol/L (ref 3.5–5.1)
Potassium: 4.1 mmol/L (ref 3.5–5.1)
Sodium: 141 mmol/L (ref 135–145)
Sodium: 142 mmol/L (ref 135–145)

## 2019-10-02 LAB — COOXEMETRY PANEL
Carboxyhemoglobin: 1.4 % (ref 0.5–1.5)
Methemoglobin: 1.2 % (ref 0.0–1.5)
O2 Saturation: 76.3 %
Total hemoglobin: 8.5 g/dL — ABNORMAL LOW (ref 12.0–16.0)

## 2019-10-02 LAB — MAGNESIUM: Magnesium: 1.7 mg/dL (ref 1.7–2.4)

## 2019-10-02 MED ORDER — POTASSIUM CHLORIDE 20 MEQ/15ML (10%) PO SOLN
40.0000 meq | Freq: Once | ORAL | Status: AC
  Administered 2019-10-02: 40 meq
  Filled 2019-10-02: qty 30

## 2019-10-02 MED ORDER — SODIUM CHLORIDE 0.9% FLUSH
10.0000 mL | Freq: Two times a day (BID) | INTRAVENOUS | Status: DC
  Administered 2019-10-02 – 2019-10-09 (×16): 10 mL
  Administered 2019-10-09: 30 mL
  Administered 2019-10-10: 10 mL
  Administered 2019-10-10: 20 mL
  Administered 2019-10-11 – 2019-10-15 (×8): 10 mL
  Administered 2019-10-15: 20 mL
  Administered 2019-10-16: 10 mL
  Administered 2019-10-16: 20 mL
  Administered 2019-10-17 – 2019-10-24 (×14): 10 mL
  Administered 2019-10-25: 20 mL
  Administered 2019-10-25 – 2019-10-26 (×2): 10 mL
  Administered 2019-10-26 – 2019-10-27 (×2): 20 mL
  Administered 2019-10-28: 10 mL
  Administered 2019-10-28: 20 mL
  Administered 2019-10-29 – 2019-11-05 (×9): 10 mL

## 2019-10-02 MED ORDER — SODIUM CHLORIDE 0.9% FLUSH
10.0000 mL | INTRAVENOUS | Status: DC | PRN
  Administered 2019-10-20 – 2019-10-21 (×2): 10 mL

## 2019-10-02 MED ORDER — POTASSIUM CHLORIDE 20 MEQ/15ML (10%) PO SOLN
40.0000 meq | ORAL | Status: AC
  Administered 2019-10-02 (×2): 40 meq
  Filled 2019-10-02 (×2): qty 30

## 2019-10-02 MED ORDER — SACUBITRIL-VALSARTAN 97-103 MG PO TABS
1.0000 | ORAL_TABLET | Freq: Two times a day (BID) | ORAL | Status: DC
  Administered 2019-10-02 – 2019-10-14 (×25): 1
  Filled 2019-10-02 (×28): qty 1

## 2019-10-02 MED ORDER — VITAL AF 1.2 CAL PO LIQD
1000.0000 mL | ORAL | Status: DC
  Administered 2019-10-02 – 2019-10-04 (×4): 1000 mL

## 2019-10-02 MED ORDER — FUROSEMIDE 10 MG/ML IJ SOLN
80.0000 mg | Freq: Once | INTRAMUSCULAR | Status: AC
  Administered 2019-10-02: 80 mg via INTRAVENOUS
  Filled 2019-10-02: qty 8

## 2019-10-02 MED ORDER — MAGNESIUM SULFATE 2 GM/50ML IV SOLN
2.0000 g | Freq: Once | INTRAVENOUS | Status: AC
  Administered 2019-10-02: 2 g via INTRAVENOUS
  Filled 2019-10-02: qty 50

## 2019-10-02 MED ORDER — SACUBITRIL-VALSARTAN 97-103 MG PO TABS
1.0000 | ORAL_TABLET | Freq: Two times a day (BID) | ORAL | Status: DC
  Filled 2019-10-02: qty 1

## 2019-10-02 NOTE — Progress Notes (Signed)
K 3.5, Mg 1.7 Electrolytes replaced per protocol

## 2019-10-02 NOTE — Plan of Care (Signed)
  Problem: Clinical Measurements: Goal: Will remain free from infection Outcome: Progressing Goal: Diagnostic test results will improve Outcome: Progressing Goal: Cardiovascular complication will be avoided Outcome: Progressing   Problem: Nutrition: Goal: Adequate nutrition will be maintained Outcome: Progressing   Problem: Elimination: Goal: Will not experience complications related to urinary retention Outcome: Progressing   Problem: Safety: Goal: Ability to remain free from injury will improve Outcome: Progressing   Problem: Skin Integrity: Goal: Risk for impaired skin integrity will decrease Outcome: Progressing   Problem: Education: Goal: Knowledge of General Education information will improve Description: Including pain rating scale, medication(s)/side effects and non-pharmacologic comfort measures Outcome: Not Progressing   Problem: Health Behavior/Discharge Planning: Goal: Ability to manage health-related needs will improve Outcome: Not Progressing   Problem: Clinical Measurements: Goal: Ability to maintain clinical measurements within normal limits will improve Outcome: Not Progressing Goal: Respiratory complications will improve Outcome: Not Progressing   Problem: Activity: Goal: Risk for activity intolerance will decrease Outcome: Not Progressing   Problem: Coping: Goal: Level of anxiety will decrease Outcome: Not Progressing   Problem: Elimination: Goal: Will not experience complications related to bowel motility Outcome: Not Progressing   Problem: Pain Managment: Goal: General experience of comfort will improve Outcome: Not Progressing

## 2019-10-02 NOTE — Progress Notes (Signed)
NAME:  Beverly [name], MRN:  [id number], DOB:  September 27, 1985, LOS: 8 ADMISSION DATE:  09/24/2019, CONSULTATION DATE:  8/18 REFERRING MD:  Dr. Eldridge Dace, CHIEF COMPLAINT:  VF arrest   Brief History   34 year old female 2 weeks post partum presented with chest pain and suffered VF arrest.    History of present illness   Patient is encephalopathic and/or intubated. Therefore history has been obtained from chart review.  34 year old female with PMH as below, which is significant for HTN and 2 weeks post-partum. She had emergency C-section delivery due to premature rupture of membranes. She did have some gestational hypertension, but her pregnancy was otherwise uncomplicated. On 8/17 she developed dull chest pain shortly after eating, which prompted ED presentation at [facility]. Resolved spontaneously. No associated symptoms.   Past Medical History   has a past medical history of Hypertension and Vaginal Pap smear, abnormal.  Significant Hospital Events   8/17 chest pain, VF arrest; While in ED, she suffered VT > VF arrest with ROSC after defib x 3 and epi x 1. Immediately after arrest ST elevation was noted on EKG, which did improve on repeat. Differential ACS, SCAD, and toresades/metabolic. Electrolytes were replaced. She was taken to the cath lab at [facility]  Where aneurysmal dilation of the ostial and proximal LAD and was transferred to [facility] for ICU admission.  8/23 possibly febrile. Pan-cultures sent. Still on full support. Gets hypertensive when sedation let up. abx started later in day when temp >101 8/24 opens eyes to stimulus. + strong cough. Tolerating PSV but mental status still not supporting extubation  8/25 about the same  Consults:    Procedures:  8/18> Central Line  Significant Diagnostic Tests:  [facility] 8/17 > No coronary artery occlusion or critical stenosis. There is aneurysmal dilation of the ostial and proximal LAD with possible ulceration or dissection of uncertain  chronicity.  TIMI-3 flow is noted throughout the LAD and its branches. Severely reduced left ventricular systolic function with mid/apical anterior, apical, and apical inferior hypokinesis/akinesis. LV gram 30% EF.  CT head 8/17 > normal  CTA chest 8/17 > no evidence of PE, moderate severity infiltrates vs ATX, small bilateral pleural effusions. Very small pericaradial effusion.  CT abdomen/pelvis 8/17 > Enlarged, heterogeneous uterus, Small amount of fluid seen in between the right kidney and right lobe of the liver. Echo 8/18> LVEF 30-35%, septal and apical akinesis distal anterior wall an d mid/distal inferior wall hypokinesis hyperdynamic basal function. LV size mildly dilated.  MRI brain 8/23: Normal  Micro Data:  Blood 8/18 > Respiratory 8/23: Few GPC, rare G VR rare Enterobacter UC 8/23>>> negative Blood 8/23>> Antimicrobials:  Ceftriazone 8/18 >completed 5 days Azithromycin 8/18 >8/18 Cefepime 8/23 Vancomycin 8/23-->8/25 Interim history/subjective:  About the same   Objective   Blood pressure (Abnormal) 142/93, pulse 97, temperature 99.1 F (37.3 C), resp. rate 15, height 5\' 10"  (1.778 m), weight 104.9 kg, SpO2 97 %, unknown if currently breastfeeding. CVP:  [3 mmHg-7 mmHg] 3 mmHg  Vent Mode: CPAP;PSV FiO2 (%):  [40 %] 40 % Set Rate:  [15 bmp] 15 bmp Vt Set:  [520 mL] 520 mL PEEP:  [5 cmH20] 5 cmH20 Pressure Support:  [5 cmH20] 5 cmH20 Plateau Pressure:  [14 cmH20-16 cmH20] 16 cmH20   Intake/Output Summary (Last 24 hours) at 10/02/2019 1114 Last data filed at 10/02/2019 1000 Gross per 24 hour  Intake 2265.51 ml  Output 5000 ml  Net -2734.49 ml  Filed Weights   09/29/19 0500 09/30/19 0400 10/02/19 0239  Weight: 103.3 kg 103.3 kg 104.9 kg    Examination: General 34 yobf still not purposeful. Looks comfortable on PSV HENT NCAT right IJ CVL in place orally intubated Pulm scattered rhonchi equal chest rise VTs 500s on PSV 5 Card rrr Abd soft not tender + bowel  sounds Ext dependent edema  Neuro still has cough. Almost decorticate posturing w/ sig noxious stim otherwise not responsive GU cl yellow   Resolved Hospital Problem list   Hypokalemia  Assessment & Plan:   VF Arrest New Onset Post Partum Cardiomyopathy, EF 35% HFrEF Plan Cont coreg, entresto, dig, spironolactone and Bidil as directed by HF team  Cont asa and plavix Heart cath if neurologically recovers    Acute metabolic status post cardiac arrest -Currently MRI negative EEG negative for seizures; now off sedation Plan Avoid fever and sedation  Treat infection   Acute Hypoxemic Respiratory Failure 2/2 to Cardiac Arrest, Pulmonary Edema further complicated by Enterobacter pneumonia (S) to cefepime  Tolerating pressure support ventilation well however mental status precludes extubation still Plan Cont PSV but not candidate for extubation given MS VAP bundle  PAD Protocol; RASS goal 0 Cefepime day 4 of 7 Anticipate trach Monday/tuesday   Fever Likely secondary to pneumonia Plan PRN antipyretics  Fluid electrolyte imbalance: Hypokalemia Plan Replace recheck  Persistent hypoglycemia- cortisol okay Was placed on D10, now off Plan Cont tubefeeds  Mild anemia without evidence of bleeding Plan Trending cbc and transfuse for hgb < 7 Best practice:  Diet: TF Pain/Anxiety/Delirium protocol (if indicated): wean VAP protocol (if indicated): yes DVT prophylaxis: lovenox GI prophylaxis: PPI Glucose control: SSI Mobility: BR Code Status: FULL  Family Communication: updated mother at bedside 8/21 Disposition: ICU  My cct 32 min   Simonne Martinet ACNP-BC [facility] Pulmonary/Critical Care Pager # [phone] OR # [phone] if no answer

## 2019-10-02 NOTE — Progress Notes (Signed)
Nutrition Follow Up  DOCUMENTATION CODES:   Not applicable  INTERVENTION:   Tube feeding:  -Vital AF 1.2 @ 70 ml/hr via OG (1680 ml) -45 ml ProSource QID -Free water flushes 200 ml Q6 hours   Provides: 2176 kcals, 170 grams protein, 1362 ml free water (2162 ml with flushes).   NUTRITION DIAGNOSIS:   Increased nutrient needs related to post-op healing as evidenced by estimated needs.  Ongoing  GOAL:   Patient will meet greater than or equal to 90% of their needs   Addressed via TF  MONITOR:   Vent status, Skin, TF tolerance, I & O's, Labs, Weight trends  REASON FOR ASSESSMENT:   Ventilator   ASSESSMENT:   Patient with PMH significant for HTN and 2 weeks post partum emergency C-section. Presents this admission with new onset peripartum cardiomyopathy with VF arrest.   Mental status precludes extubation. May require trach early next week. Heart cath if neuro status improves. Off propofol. CBGs improved, D10 d/c. Tolerating tube feeding at goal.   Admission weight: 103.1 kg  Current weight: 104.9 kg   Patient remains intubated on ventilator support MV: 5.3 L/min Temp (24hrs), Avg:99.1 F (37.3 C), Min:98.2 F (36.8 C), Max:99.7 F (37.6 C)  Medications: colace, SS novolog, miralax, prenatal MVI, aldactone Labs: CBG 97-152  Diet Order:   Diet Order            Diet NPO time specified  Diet effective now                 EDUCATION NEEDS:   Not appropriate for education at this time  Skin:  Skin Assessment: Reviewed RN Assessment  Last BM:  8/25  Height:   Ht Readings from Last 1 Encounters:  09/24/19 5\' 10"  (1.778 m)    Weight:   Wt Readings from Last 1 Encounters:  10/02/19 104.9 kg    BMI:  Body mass index is 33.18 kg/m.  Estimated Nutritional Needs:   Kcal:  10/04/19 kcal  Protein:  151-189 grams  Fluid:  >/= 1.8 L/day   [id number] RD, LDN Clinical Nutrition Pager listed in AMION

## 2019-10-02 NOTE — Progress Notes (Signed)
Peripherally Inserted Central Catheter Placement  The IV Nurse has discussed with the patient and/or persons authorized to consent for the patient, the purpose of this procedure and the potential benefits and risks involved with this procedure.  The benefits include less needle sticks, lab draws from the catheter, and the patient may be discharged home with the catheter. Risks include, but not limited to, infection, bleeding, blood clot (thrombus formation), and puncture of an artery; nerve damage and irregular heartbeat and possibility to perform a PICC exchange if needed/ordered by physician.  Alternatives to this procedure were also discussed.  Bard Power PICC patient education guide, fact sheet on infection prevention and patient information card has been provided to patient /or left at bedside.    PICC Placement Documentation  PICC Double Lumen 10/02/19 PICC Right Basilic 45 cm 0 cm (Active)  Indication for Insertion or Continuance of Line Prolonged intravenous therapies 10/02/19 1040  Exposed Catheter (cm) 0 cm 10/02/19 1040  Site Assessment Clean;Dry;Intact 10/02/19 1040  Lumen #1 Status Flushed;Blood return noted;Saline locked 10/02/19 1040  Lumen #2 Status Flushed;Blood return noted;Saline locked 10/02/19 1040  Dressing Type Transparent;Securing device 10/02/19 1040  Dressing Status Antimicrobial disc in place;Intact;Dry;Clean 10/02/19 1040  Dressing Change Due 10/09/19 10/02/19 1040       Romie Jumper 10/02/2019, 11:05 AM

## 2019-10-02 NOTE — Progress Notes (Signed)
ADVANCED HF PROGRESS  Patient Name: Beverly [name] Date of Encounter: 10/02/2019  [facility] Cardiologist: No primary care provider on file.   Subjective   Will arouse to voice and pain but will not follow commands or track.  SBP stable. Weight climbing. CVP 7. Co-ox 76%. No further VT   Respiratory cultures growing rare enterobacter aerogenes. BCX negative   Inpatient Medications    Scheduled Meds: . acetaminophen (TYLENOL) oral liquid 160 mg/5 mL  650 mg Per Tube Q4H  . aspirin  81 mg Per Tube Daily  . atorvastatin  80 mg Per Tube Daily  . carvedilol  6.25 mg Per Tube BID WC  . chlorhexidine gluconate (MEDLINE KIT)  15 mL Mouth Rinse BID  . Chlorhexidine Gluconate Cloth  6 each Topical Daily  . digoxin  0.125 mg Per Tube Daily  . docusate  100 mg Per Tube BID  . enoxaparin (LOVENOX) injection  40 mg Subcutaneous Q24H  . feeding supplement (PROSource TF)  45 mL Per Tube QID  . free water  200 mL Per Tube Q6H  . insulin aspart  0-15 Units Subcutaneous Q4H  . mouth rinse  15 mL Mouth Rinse 10 times per day  . pantoprazole (PROTONIX) IV  40 mg Intravenous Q24H  . polyethylene glycol  17 g Per Tube Daily  . prenatal multivitamin  1 tablet Per Tube Q1200  . sacubitril-valsartan  1 tablet Per Tube BID  . sodium chloride flush  10-40 mL Intracatheter Q12H  . spironolactone  25 mg Per Tube Daily   Continuous Infusions: . sodium chloride 20 mL/hr at 10/02/19 0623  . ceFEPime (MAXIPIME) IV Stopped (10/02/19 0254)  . feeding supplement (VITAL AF 1.2 CAL) 1,000 mL (10/01/19 2221)  . levETIRAcetam 1,500 mg (10/01/19 2118)  . propofol (DIPRIVAN) infusion Stopped (09/30/19 0603)   PRN Meds: fentaNYL (SUBLIMAZE) injection, hydrALAZINE, midazolam, sodium chloride flush   Vital Signs    Vitals:   10/02/19 0343 10/02/19 0400 10/02/19 0500 10/02/19 0600  BP: 112/89 135/89 139/87 139/84  Pulse: 69 85 76 77  Resp:  '14 14 16  ' Temp:  98.2 F (36.8 C) 98.4 F (36.9 C) 98.8  F (37.1 C)  TempSrc:  Bladder    SpO2: 100% 99% 100% 100%  Weight:      Height:        Intake/Output Summary (Last 24 hours) at 10/02/2019 0705 Last data filed at 10/02/2019 0093 Gross per 24 hour  Intake 2710.58 ml  Output 2575 ml  Net 135.58 ml   Last 3 Weights 10/02/2019 09/30/2019 09/29/2019  Weight (lbs) 231 lb 4.2 oz 227 lb 11.8 oz 227 lb 11.8 oz  Weight (kg) 104.9 kg 103.3 kg 103.3 kg  Some encounter information is confidential and restricted. Go to Review Flowsheets activity to see all data.      Telemetry    Sinus 70-80s, no further VT/VF.  Personally reviewed    Physical Exam   General:  Awake on vent. Will respond to pain and voice but will not track or follow comamnds HEENT: normal + ETT Neck: supple. JVP 7. Carotids 2+ bilat; no bruits. No lymphadenopathy or thryomegaly appreciated. Cor: PMI nondisplaced. Regular rate & rhythm. No rubs, gallops or murmurs. Lungs: clear Abdomen: soft, nontender, nondistended. No hepatosplenomegaly. No bruits or masses. Good bowel sounds. Extremities: no cyanosis, clubbing, rash, 1+ edema Neuro:Awake on vent. Will respond to pain and voice but will not track or follow comamnds  Labs    High  Sensitivity Troponin:   Recent Labs  Lab 09/23/19 1539 09/23/19 1816 09/24/19 0039 09/24/19 0826  TROPONINIHS 127* 2,078* 24,421* >27,000*      Chemistry Recent Labs  Lab 09/27/19 0321 09/27/19 0642 09/30/19 1320 10/01/19 0530 10/02/19 0349  NA 141   < > 141 139 141  K 3.7   < > 3.0* 3.4* 3.5  CL 104   < > 106 106 104  CO2 24   < > '28 26 28  ' GLUCOSE 95   < > 151* 152* 107*  BUN 17   < > '10 9 11  ' CREATININE 0.72   < > 0.47 0.45 0.42*  CALCIUM 9.2   < > 8.4* 8.4* 8.6*  PROT 6.6  --   --   --   --   ALBUMIN 2.4*  --   --   --   --   AST 41  --   --   --   --   ALT 40  --   --   --   --   ALKPHOS 84  --   --   --   --   BILITOT 0.6  --   --   --   --   GFRNONAA >60   < > >60 >60 >60  GFRAA >60   < > >60 >60 >60  ANIONGAP  13   < > '7 7 9   ' < > = values in this interval not displayed.     Hematology Recent Labs  Lab 09/30/19 0211 10/01/19 0530 10/02/19 0349  WBC 7.9 9.2 7.6  RBC 3.31* 3.35* 3.27*  HGB 8.5* 8.5* 8.2*  HCT 28.4* 29.0* 28.2*  MCV 85.8 86.6 86.2  MCH 25.7* 25.4* 25.1*  MCHC 29.9* 29.3* 29.1*  RDW 17.2* 17.2* 17.3*  PLT 556* 544* 524*    BNP No results for input(s): BNP, PROBNP in the last 168 hours.   DDimer No results for input(s): DDIMER in the last 168 hours.   Radiology    [place] EKG SITE RITE  Result Date: 10/01/2019 If [facility] image not attached, placement could not be confirmed due to current cardiac rhythm.   Cardiac Studies   See section above (reviewed)  Patient Profile     34 y.o. female 2 weeks post-partum with VF/torsades arrest in the setting of anterior MI  Assessment & Plan    1. VF arrest (in-hospital): in setting of anterior infarct. Rhythm has stabilized, off amiodarone, no significant arrhythmia on continuous telemetry. Continue beta-blockade.  - Now rewarmed and off sedation. Will awaken and withdraw to pain but not following commands. Concern for anoxic injury - EEG 8/20 diffuse slowing but improved - Brain MRI normal. - Vent management per PCCM - May be worthwhile to get Neuro involved again - VT quiescent. Keep K> 4.0 Mg > 2.0  2. Acute systolic heart failure: secondary to anterior MI, LVEF 35%.  - On carvedilol 6.25 bid, Entresto 97-103bid, dig 0.125 and spiro 25 - Co-ox 76% - Volume status climbing. Will give IV lasix today - Can add Bidil as needed 3. CAD/Anterior MI: angio reviewed again. Appears to have aneurysmal proximal LAD with dissection, likely culprit. Had TIMI-3 flow at cath but large infarct. Would anticipate relook catheterization once she recovers, likely medical therapy but will need to reassess.  - Continue ASA,  beta-blocker and statin.  - no evidence of ongoing ischemia  4. Encephalopathy, post-arrest. - plan as above 5.  Hypokalemia/hypomag - K 3.5 today,  Mg 1.7, aggressively supp. Discussed w/ pharmacy  6. Hypertension - improved today on current regimen  7. Fevers - ? Neurogenic vs infectious - Low grade fever overnight, mTemp 100  - BC NGTD - Respiratory cultures growing rare enterobacter aerogenes.  - continue empiric vanc + cefepime  - CCM managing abx   CRITICAL CARE Performed by: Glori Bickers  Total critical care time: 35 minutes  Critical care time was exclusive of separately billable procedures and treating other patients.  Critical care was necessary to treat or prevent imminent or life-threatening deterioration.  Critical care was time spent personally by me (independent of midlevel providers or residents) on the following activities: development of treatment plan with patient and/or surrogate as well as nursing, discussions with consultants, evaluation of patient's response to treatment, examination of patient, obtaining history from patient or surrogate, ordering and performing treatments and interventions, ordering and review of laboratory studies, ordering and review of radiographic studies, pulse oximetry and re-evaluation of patient's condition.      Signed, Glori Bickers, MD  10/02/2019, 7:05 AM

## 2019-10-02 NOTE — Progress Notes (Signed)
Breast milk pumped every four hours this shift for a total of 25cc from patient.

## 2019-10-02 NOTE — Progress Notes (Signed)
Breast pumped q4 hours. Total milk output was 7 cc for day shift 7a-7P

## 2019-10-03 ENCOUNTER — Encounter (HOSPITAL_COMMUNITY): Payer: Self-pay | Admitting: Internal Medicine

## 2019-10-03 DIAGNOSIS — J168 Pneumonia due to other specified infectious organisms: Secondary | ICD-10-CM

## 2019-10-03 LAB — BASIC METABOLIC PANEL
Anion gap: 10 (ref 5–15)
BUN: 13 mg/dL (ref 6–20)
CO2: 27 mmol/L (ref 22–32)
Calcium: 9 mg/dL (ref 8.9–10.3)
Chloride: 104 mmol/L (ref 98–111)
Creatinine, Ser: 0.45 mg/dL (ref 0.44–1.00)
GFR calc Af Amer: >60 mL/min (ref >60)
GFR calc non Af Amer: >60 mL/min (ref >60)
Glucose, Bld: 117 mg/dL — ABNORMAL HIGH (ref 70–99)
Potassium: 3.9 mmol/L (ref 3.5–5.1)
Sodium: 141 mmol/L (ref 135–145)

## 2019-10-03 LAB — GLUCOSE, CAPILLARY
Glucose-Capillary: 107 mg/dL — ABNORMAL HIGH (ref 70–99)
Glucose-Capillary: 111 mg/dL — ABNORMAL HIGH (ref 70–99)
Glucose-Capillary: 113 mg/dL — ABNORMAL HIGH (ref 70–99)
Glucose-Capillary: 116 mg/dL — ABNORMAL HIGH (ref 70–99)
Glucose-Capillary: 117 mg/dL — ABNORMAL HIGH (ref 70–99)
Glucose-Capillary: 130 mg/dL — ABNORMAL HIGH (ref 70–99)

## 2019-10-03 LAB — COOXEMETRY PANEL
Carboxyhemoglobin: 1.3 % (ref 0.5–1.5)
Methemoglobin: 1.3 % (ref 0.0–1.5)
O2 Saturation: 70.6 %
Total hemoglobin: 9.7 g/dL — ABNORMAL LOW (ref 12.0–16.0)

## 2019-10-03 LAB — MAGNESIUM
Magnesium: 1.7 mg/dL (ref 1.7–2.4)
Magnesium: 1.7 mg/dL (ref 1.7–2.4)

## 2019-10-03 MED ORDER — POTASSIUM CHLORIDE 20 MEQ/15ML (10%) PO SOLN
40.0000 meq | Freq: Once | ORAL | Status: AC
  Administered 2019-10-03: 40 meq
  Filled 2019-10-03: qty 30

## 2019-10-03 MED ORDER — MAGNESIUM SULFATE 2 GM/50ML IV SOLN
2.0000 g | Freq: Once | INTRAVENOUS | Status: AC
  Administered 2019-10-03: 2 g via INTRAVENOUS
  Filled 2019-10-03: qty 50

## 2019-10-03 MED ORDER — CHLORHEXIDINE GLUCONATE 0.12 % MT SOLN
OROMUCOSAL | Status: AC
  Filled 2019-10-03: qty 15

## 2019-10-03 NOTE — Lactation Note (Signed)
Lactation Consultation Note  Patient Name: Beverly [name] Date: 10/03/2019   I received a call from Delories Heinz, RN, informing me that Mom has had her breasts pumped q4h & her expressed breast milk output has progressively decreased to nil. I explained that if Mom's breasts are soft (and the RN concurred that they were), then her breasts have likely involuted & there is no reason to continue attempting to express her milk.     RN was unsure of what had been done with previously expressed breast milk. I recommended that if milk had not been thrown out, then a pharmacist be asked about safety of Mom's medications before it being sent home to give to infant.   Lurline Hare [facility] 10/03/2019, 8:23 AM

## 2019-10-03 NOTE — Progress Notes (Signed)
During assessment, RT noticed patients tube was out to 19 cm at lip. RT advanced tube back to 23 cm at lip. Patient tolerated well. Vitals normal. Patient currently weaning on ventilator.

## 2019-10-03 NOTE — Progress Notes (Signed)
0757 Pulled patient up in bed with NT and patient HR to 250. Pt suctioned to attempt to bring HR down. Bensimhon, MD and Chestine Spore, MD to bedside. Pt HR spontaneously broke to 107 without meds. Will monitor closely and not reposition at this time.  Delories Heinz, RN

## 2019-10-03 NOTE — Progress Notes (Signed)
NAME:  SAVVY PEETERS, MRN:  [id number], DOB:  19-Oct-1985, LOS: 9 ADMISSION DATE:  09/24/2019, CONSULTATION DATE:  8/18 REFERRING MD:  Dr. Eldridge Dace, CHIEF COMPLAINT:  VF arrest   Brief History   34 year old female 2 weeks post partum presented with chest pain and suffered VF arrest.    History of present illness   Patient is encephalopathic and/or intubated. Therefore history has been obtained from chart review.  34 year old female with PMH as below, which is significant for HTN and 2 weeks post-partum. She had emergency C-section delivery due to premature rupture of membranes. She did have some gestational hypertension, but her pregnancy was otherwise uncomplicated. On 8/17 she developed dull chest pain shortly after eating, which prompted ED presentation at [facility]. Resolved spontaneously. No associated symptoms.   Past Medical History   has a past medical history of Hypertension and Vaginal Pap smear, abnormal.  Significant Hospital Events   8/17 chest pain, VF arrest; While in ED, she suffered VT > VF arrest with ROSC after defib x 3 and epi x 1. Immediately after arrest ST elevation was noted on EKG, which did improve on repeat. Differential ACS, SCAD, and toresades/metabolic. Electrolytes were replaced. She was taken to the cath lab at [facility]  Where aneurysmal dilation of the ostial and proximal LAD and was transferred to [facility] for ICU admission.  8/23 possibly febrile. Pan-cultures sent. Still on full support. Gets hypertensive when sedation let up. abx started later in day when temp >101 8/24 opens eyes to stimulus. + strong cough. Tolerating PSV but mental status still not supporting extubation  8/25 about the same  8/27: Seems to be moving a little bit more but mostly posturing.  Opening eyes more.  Had a run of SVT. Consults:    Procedures:  8/18> Central Line, removed 8/26 PICC line placed  Significant Diagnostic Tests:  LHC 8/17 > No coronary artery occlusion or  critical stenosis. There is aneurysmal dilation of the ostial and proximal LAD with possible ulceration or dissection of uncertain chronicity.  TIMI-3 flow is noted throughout the LAD and its branches. Severely reduced left ventricular systolic function with mid/apical anterior, apical, and apical inferior hypokinesis/akinesis. LV gram 30% EF.  CT head 8/17 > normal  CTA chest 8/17 > no evidence of PE, moderate severity infiltrates vs ATX, small bilateral pleural effusions. Very small pericaradial effusion.  CT abdomen/pelvis 8/17 > Enlarged, heterogeneous uterus, Small amount of fluid seen in between the right kidney and right lobe of the liver. Echo 8/18> LVEF 30-35%, septal and apical akinesis distal anterior wall an d mid/distal inferior wall hypokinesis hyperdynamic basal function. LV size mildly dilated.  MRI brain 8/23: Normal  Micro Data:  Blood 8/18 > Respiratory 8/23: Few GPC, rare G VR rare Enterobacter UC 8/23>>> negative Blood 8/23>> Antimicrobials:  Ceftriazone 8/18 >completed 5 days Azithromycin 8/18 >8/18 Cefepime 8/23 Vancomycin 8/23-->8/25 Interim history/subjective:  Had a run of SVT this morning otherwise no significant change  Objective   Blood pressure (Abnormal) 149/98, pulse 88, temperature 99.3 F (37.4 C), resp. rate 11, height 5\' 10"  (1.778 m), weight 102 kg, SpO2 98 %, unknown if currently breastfeeding. CVP:  [5 mmHg-8 mmHg] 8 mmHg  Vent Mode: CPAP;PSV FiO2 (%):  [40 %] 40 % Set Rate:  [15 bmp] 15 bmp Vt Set:  [520 mL] 520 mL PEEP:  [5 cmH20] 5 cmH20 Pressure Support:  [5 cmH20-8 cmH20] 8 cmH20 Plateau Pressure:  [11 cmH20-18 cmH20]  18 cmH20   Intake/Output Summary (Last 24 hours) at 10/03/2019 1029 Last data filed at 10/03/2019 0900 Gross per 24 hour  Intake 2014.58 ml  Output 2250 ml  Net -235.42 ml   Filed Weights   09/30/19 0400 10/02/19 0239 10/03/19 0123  Weight: 103.3 kg 104.9 kg 102 kg    Examination: General 34 year old black female  remains orally intubated, tolerating pressure support ventilation Neuro: Will open eyes with cough and painful stimulus.  Seems to be having intermittent posturing.  Still not purposeful.  Has been getting as needed morphine during bath time Pulmonary: Coarse scattered rhonchi equal chest rise bilateral Abdomen soft nontender Cardiac current regular rate and rhythm Extremities warm dry dependent edema GU clear yellow  Resolved Hospital Problem list   Hypokalemia  Assessment & Plan:   VF Arrest New Onset Post Partum Cardiomyopathy, EF 35% HFrEF New SVT on 8/27 Plan Continue Entresto, Coreg, spironolactone and BiDil as directed by heart failure team Continue aspirin and Plavix Continue telemetry monitoring Ensure potassium greater than 4 and magnesium greater than 2  Acute metabolic status post cardiac arrest -Currently MRI negative EEG negative for seizures; now off sedation Plan Treating infection Avoiding fever  Acute Hypoxemic Respiratory Failure 2/2 to Cardiac Arrest, Pulmonary Edema further complicated by Enterobacter pneumonia (S) to cefepime  Tolerating pressure support ventilation well however mental status precludes extubation still Plan Continue pressure support ventilation as tolerated  VAP bundle  PAD protocol  Cefepime day 5 of 7  Anticipate tracheostomy Monday or Tuesday     Fever Likely secondary to pneumonia Plan PRN antipyretics  Fluid electrolyte imbalance: Hypokalemia Plan We will check periodically  Persistent hypoglycemia- cortisol okay Was placed on D10, now off Plan Continue tube feeds  Mild anemia without evidence of bleeding Plan Intermittent CBC Best practice:  Diet: TF Pain/Anxiety/Delirium protocol (if indicated): wean VAP protocol (if indicated): yes DVT prophylaxis: lovenox GI prophylaxis: PPI Glucose control: SSI Mobility: BR Code Status: FULL  Family Communication: updated mother at bedside 8/21 Disposition: ICU  My  cct 32 min   Simonne Martinet ACNP-BC [facility] Pulmonary/Critical Care Pager # [phone] OR # [phone] if no answer

## 2019-10-03 NOTE — Progress Notes (Signed)
Assisted tele visit to patient with mother.  Sunny Schlein Toshiro Hanken RN

## 2019-10-03 NOTE — Progress Notes (Addendum)
ADVANCED HF PROGRESS  Patient Name: Beverly [name] Date of Encounter: 10/03/2019  [facility] Cardiologist: No primary care provider on file.   Subjective   Remains on vent.   Opening eyes. Moving more spontaneously but having some posturing. Fighting tube more this am.   Had run SVT at 240bpm this am broke with ET suctioning with Dr. Carlis Abbott and me in the room.    Diuresed well with IV lasix well yesterday. Weight down 7 pounds.  Co-ox 71%   Respiratory cultures growing rare enterobacter aerogenes. BCX negative   Inpatient Medications    Scheduled Meds: . acetaminophen (TYLENOL) oral liquid 160 mg/5 mL  650 mg Per Tube Q4H  . aspirin  81 mg Per Tube Daily  . atorvastatin  80 mg Per Tube Daily  . carvedilol  6.25 mg Per Tube BID WC  . chlorhexidine gluconate (MEDLINE KIT)  15 mL Mouth Rinse BID  . Chlorhexidine Gluconate Cloth  6 each Topical Daily  . digoxin  0.125 mg Per Tube Daily  . docusate  100 mg Per Tube BID  . enoxaparin (LOVENOX) injection  40 mg Subcutaneous Q24H  . feeding supplement (PROSource TF)  45 mL Per Tube QID  . free water  200 mL Per Tube Q6H  . insulin aspart  0-15 Units Subcutaneous Q4H  . mouth rinse  15 mL Mouth Rinse 10 times per day  . pantoprazole (PROTONIX) IV  40 mg Intravenous Q24H  . polyethylene glycol  17 g Per Tube Daily  . prenatal multivitamin  1 tablet Per Tube Q1200  . sacubitril-valsartan  1 tablet Per Tube BID  . sodium chloride flush  10-40 mL Intracatheter Q12H  . sodium chloride flush  10-40 mL Intracatheter Q12H  . spironolactone  25 mg Per Tube Daily   Continuous Infusions: . sodium chloride 10 mL/hr at 10/02/19 1100  . ceFEPime (MAXIPIME) IV 2 g (10/03/19 0137)  . feeding supplement (VITAL AF 1.2 CAL) 1,000 mL (10/03/19 0749)  . levETIRAcetam 1,500 mg (10/02/19 2143)  . propofol (DIPRIVAN) infusion Stopped (09/30/19 0603)   PRN Meds: fentaNYL (SUBLIMAZE) injection, hydrALAZINE, midazolam, sodium chloride flush,  sodium chloride flush   Vital Signs    Vitals:   10/03/19 0803 10/03/19 0808 10/03/19 0813 10/03/19 0827  BP:  (!) 168/94    Pulse: (!) 103 (!) 102 (!) 106 (!) 102  Resp: 18 16 (!) 31 (!) 0  Temp: 99.3 F (37.4 C) 99.5 F (37.5 C)  99.5 F (37.5 C)  TempSrc:      SpO2: 96% 97% 96% 96%  Weight:      Height:        Intake/Output Summary (Last 24 hours) at 10/03/2019 0828 Last data filed at 10/03/2019 0700 Gross per 24 hour  Intake 1791.81 ml  Output 4575 ml  Net -2783.19 ml   Last 3 Weights 10/03/2019 10/02/2019 09/30/2019  Weight (lbs) 224 lb 13.9 oz 231 lb 4.2 oz 227 lb 11.8 oz  Weight (kg) 102 kg 104.9 kg 103.3 kg  Some encounter information is confidential and restricted. Go to Review Flowsheets activity to see all data.      Telemetry    Sinus tach 100-110s, SVT to 240 this am  Personally reviewed    Physical Exam   General:  On vent. Eyes open fighting tube but won;t track or follow commands. No response to pain  HEENT: normal + ETT Neck: supple. no JVD. Carotids 2+ bilat; no bruits. No lymphadenopathy or thryomegaly  appreciated. Cor: PMI nondisplaced. Regular rate & rhythm. No rubs, gallops or murmurs. Lungs: clear Abdomen: soft, nontender, nondistended. No hepatosplenomegaly. No bruits or masses. Good bowel sounds. Extremities: no cyanosis, clubbing, rash, edema Neuro: Eyes open fighting tube but won;t track or follow commands. No response to pain   Labs    High Sensitivity Troponin:   Recent Labs  Lab 09/23/19 1539 09/23/19 1816 09/24/19 0039 09/24/19 0826  TROPONINIHS 127* 2,078* 24,421* >27,000*      Chemistry Recent Labs  Lab 09/27/19 0321 09/27/19 3154 10/02/19 0349 10/02/19 1400 10/03/19 0308  NA 141   < > 141 142 141  K 3.7   < > 3.5 4.1 3.9  CL 104   < > 104 104 104  CO2 24   < > _0 GLUCOSE 95   < > 107* 120* 117*  BUN 17   < > _1 CREATININE 0.72   < > 0.42* 0.51 0.45  CALCIUM 9.2   < > 8.6* 8.8* 9.0  PROT 6.6  --    --   --   --   ALBUMIN 2.4*  --   --   --   --   AST 41  --   --   --   --   ALT 40  --   --   --   --   ALKPHOS 84  --   --   --   --   BILITOT 0.6  --   --   --   --   GFRNONAA >60   < > >60 >60 >60  GFRAA >60   < > >60 >60 >60  ANIONGAP 13   < > _2 < > = values in this interval not displayed.     Hematology Recent Labs  Lab 09/30/19 0211 10/01/19 0530 10/02/19 0349  WBC 7.9 9.2 7.6  RBC 3.31* 3.35* 3.27*  HGB 8.5* 8.5* 8.2*  HCT 28.4* 29.0* 28.2*  MCV 85.8 86.6 86.2  MCH 25.7* 25.4* 25.1*  MCHC 29.9* 29.3* 29.1*  RDW 17.2* 17.2* 17.3*  PLT 556* 544* 524*    BNP No results for input(s): BNP, PROBNP in the last 168 hours.   DDimer No results for input(s): DDIMER in the last 168 hours.   Radiology    [place] EKG SITE RITE  Result Date: 10/01/2019 If [facility] image not attached, placement could not be confirmed due to current cardiac rhythm.   Cardiac Studies   See section above (reviewed)  Patient Profile     34 y.o. female 2 weeks post-partum with VF/torsades arrest in the setting of anterior MI  Assessment & Plan    1. VF arrest (in-hospital): in setting of anterior infarct. Rhythm has stabilized, off amiodarone, no significant arrhythmia on continuous telemetry. Continue beta-blockade.  - Now rewarmed and off sedation. - EEG 8/20 diffuse slowing but improved - Brain MRI normal. - Vent management per PCCM - Neuro exam seems to be improving slowly but still not following commands - VT quiescent. Keep K> 4.0 Mg > 2.0  2. Acute systolic heart failure: secondary to anterior MI, LVEF 35%.  - On carvedilol 6.25 bid, Entresto 97-103bid, dig 0.125 and spiro 25 - Co-ox 71% - Volume status improved with lasix yesterday. Can redose as needed - Can add Bidil as needed 3. CAD/Anterior MI: angio reviewed again. Appears to have aneurysmal proximal LAD with dissection, likely culprit. Had TIMI-3 flow at cath  but large infarct. Would anticipate relook  catheterization once she recovers, likely medical therapy but will need to reassess.  - Continue ASA,  beta-blocker and statin.  - No evidence of ischemia currently  4. Encephalopathy, post-arrest.Likely anoxic brain injury  - plan as above - agree with plan for trach 5. Hypokalemia/hypomag - K 3.9 today wil supp  6. Hypertension - improved today on current regimen  7. Fevers - ? Neurogenic vs infectious - Low grade fever overnight, mTemp 99.9 - BC NGTD - Respiratory cultures growing rare enterobacter aerogenes.  - continue empiric vanc + cefepime  - CCM managing abx  8. SVT on 10/03/19 - broke with suctioning.  - follow  We will see again Monday unless called over the weekend  CRITICAL CARE Performed by: Glori Bickers  Total critical care time: 35 minutes  Critical care time was exclusive of separately billable procedures and treating other patients.  Critical care was necessary to treat or prevent imminent or life-threatening deterioration.  Critical care was time spent personally by me (independent of midlevel providers or residents) on the following activities: development of treatment plan with patient and/or surrogate as well as nursing, discussions with consultants, evaluation of patient's response to treatment, examination of patient, obtaining history from patient or surrogate, ordering and performing treatments and interventions, ordering and review of laboratory studies, ordering and review of radiographic studies, pulse oximetry and re-evaluation of patient's condition.      Signed, Glori Bickers, MD  10/03/2019, 8:28 AM

## 2019-10-03 NOTE — Progress Notes (Signed)
Pharmacy Antibiotic Note  Beverly [name] is a 34 y.o. female admitted on 09/24/2019 2 weeks post-partum with VF/torsades arrest in the setting of anterior MI. Pharmacy has been consulted for vancomycin/cefepime dosing for PNA. Just completed 5 days of ceftriaxone today.   Afebrile, WBC wnl. SCr 0.4 stable.  Plan: Cefepime 2g IV q8h to end on 8/30    Height: 5\' 10"  (177.8 cm) Weight: 102 kg (224 lb 13.9 oz) IBW/kg (Calculated) : 68.5  Temp (24hrs), Avg:99.2 F (37.3 C), Min:98.2 F (36.8 C), Max:99.9 F (37.7 C)  Recent Labs  Lab 09/28/19 0227 09/28/19 0227 09/29/19 0350 09/29/19 0350 09/30/19 0211 09/30/19 0211 09/30/19 1320 10/01/19 0530 10/02/19 0349 10/02/19 1400 10/03/19 0308  WBC 7.9  --  9.0  --  7.9  --   --  9.2 7.6  --   --   CREATININE 0.45   < > 0.49   < > 0.50   < > 0.47 0.45 0.42* 0.51 0.45   < > = values in this interval not displayed.    Estimated Creatinine Clearance: 130.5 mL/min (by C-G formula based on SCr of 0.45 mg/dL).    No Known Allergies  Antimicrobials this admission: Azithro 8/18 >> 8/18 Ceftriaxone 8/18 >> 8/23 Cefepime 8/23 >>(8/30) Vanc 8/23 >>8/25  Microbiology results:  8/23 UA: neg 8/23 Sputum Cx: enterobacter (S cefepime) 8/23 Ucx: ngtd final 8/23 Bcx: ngtd 8/18 BCx: ngtd final  9/18 PharmD., BCPS Clinical Pharmacist 10/03/2019 12:27 PM

## 2019-10-03 NOTE — Progress Notes (Signed)
Spoke with Lactation Consultant Kim on Mother Baby, as patients milk output was zero for the last shift and was 5 ml prior shift. Lactation Consultant suggests stopping attempts to pump at this time as patients breasts are soft and no milk is able to be expressed. Chestine Spore, MD made aware.  Delories Heinz, RN

## 2019-10-03 NOTE — Progress Notes (Signed)
°  Patient's breasts pumped every four hours. No milk output for the shift.

## 2019-10-04 LAB — GLUCOSE, CAPILLARY
Glucose-Capillary: 104 mg/dL — ABNORMAL HIGH (ref 70–99)
Glucose-Capillary: 111 mg/dL — ABNORMAL HIGH (ref 70–99)
Glucose-Capillary: 111 mg/dL — ABNORMAL HIGH (ref 70–99)
Glucose-Capillary: 113 mg/dL — ABNORMAL HIGH (ref 70–99)
Glucose-Capillary: 113 mg/dL — ABNORMAL HIGH (ref 70–99)
Glucose-Capillary: 114 mg/dL — ABNORMAL HIGH (ref 70–99)

## 2019-10-04 LAB — BASIC METABOLIC PANEL
Anion gap: 8 (ref 5–15)
BUN: 13 mg/dL (ref 6–20)
CO2: 27 mmol/L (ref 22–32)
Calcium: 8.9 mg/dL (ref 8.9–10.3)
Chloride: 103 mmol/L (ref 98–111)
Creatinine, Ser: 0.48 mg/dL (ref 0.44–1.00)
GFR calc Af Amer: >60 mL/min (ref >60)
GFR calc non Af Amer: >60 mL/min (ref >60)
Glucose, Bld: 126 mg/dL — ABNORMAL HIGH (ref 70–99)
Potassium: 3.5 mmol/L (ref 3.5–5.1)
Sodium: 138 mmol/L (ref 135–145)

## 2019-10-04 LAB — CULTURE, BLOOD (ROUTINE X 2)
Culture: NO GROWTH
Culture: NO GROWTH
Special Requests: ADEQUATE
Special Requests: ADEQUATE

## 2019-10-04 LAB — TRIGLYCERIDES: Triglycerides: 115 mg/dL (ref <150)

## 2019-10-04 LAB — MAGNESIUM: Magnesium: 1.9 mg/dL (ref 1.7–2.4)

## 2019-10-04 MED ORDER — POTASSIUM CHLORIDE 20 MEQ PO PACK
40.0000 meq | PACK | ORAL | Status: AC
  Administered 2019-10-04 (×2): 40 meq
  Filled 2019-10-04 (×2): qty 2

## 2019-10-04 MED ORDER — MAGNESIUM SULFATE 2 GM/50ML IV SOLN
2.0000 g | Freq: Once | INTRAVENOUS | Status: AC
  Administered 2019-10-04: 2 g via INTRAVENOUS
  Filled 2019-10-04: qty 50

## 2019-10-04 NOTE — Progress Notes (Signed)
NAME:  RAFEEF [name], MRN:  [id number], DOB:  [birth year], LOS: 10 ADMISSION DATE:  09/24/2019, CONSULTATION DATE:  8/18 REFERRING MD:  Dr. Eldridge [name], CHIEF COMPLAINT:  VF arrest   Brief History   34 year old female 2 weeks post partum presented with chest pain and suffered VF arrest.    History of present illness   Patient is encephalopathic and/or intubated. Therefore history has been obtained from chart review.  34 year old female with PMH as below, which is significant for HTN and 2 weeks post-partum. She had emergency C-section delivery due to premature rupture of membranes. She did have some gestational hypertension, but her pregnancy was otherwise uncomplicated. On 8/17 she developed dull chest pain shortly after eating, which prompted ED presentation at [facility]. Resolved spontaneously. No associated symptoms.   Past Medical History   has a past medical history of Hypertension and Vaginal Pap smear, abnormal.  Significant Hospital Events   8/17 chest pain, VF arrest; While in ED, she suffered VT > VF arrest with ROSC after defib x 3 and epi x 1. Immediately after arrest ST elevation was noted on EKG, which did improve on repeat. Differential ACS, SCAD, and toresades/metabolic. Electrolytes were replaced. She was taken to the cath lab at [facility]  Where aneurysmal dilation of the ostial and proximal LAD and was transferred to [facility] for ICU admission.  8/23 possibly febrile. Pan-cultures sent. Still on full support. Gets hypertensive when sedation let up. abx started later in day when temp >101 8/24 opens eyes to stimulus. + strong cough. Tolerating PSV but mental status still not supporting extubation  8/25 about the same  8/27: Seems to be moving a little bit more but mostly posturing.  Opening eyes more.  Had a run of SVT. Consults:    Procedures:  8/18> Central Line, removed 8/26 PICC line placed  Significant Diagnostic Tests:  LHC 8/17 > No coronary artery occlusion  or critical stenosis. There is aneurysmal dilation of the ostial and proximal LAD with possible ulceration or dissection of uncertain chronicity.  TIMI-3 flow is noted throughout the LAD and its branches. Severely reduced left ventricular systolic function with mid/apical anterior, apical, and apical inferior hypokinesis/akinesis. LV gram 30% EF.  CT head 8/17 > normal  CTA chest 8/17 > no evidence of PE, moderate severity infiltrates vs ATX, small bilateral pleural effusions. Very small pericaradial effusion.  CT abdomen/pelvis 8/17 > Enlarged, heterogeneous uterus, Small amount of fluid seen in between the right kidney and right lobe of the liver. Echo 8/18> LVEF 30-35%, septal and apical akinesis distal anterior wall an d mid/distal inferior wall hypokinesis hyperdynamic basal function. LV size mildly dilated.  MRI brain 8/23: Normal  Micro Data:  Blood 8/18 > Respiratory 8/23: Few GPC, rare G VR rare Enterobacter UC 8/23>>> negative Blood 8/23>> Antimicrobials:  Ceftriazone 8/18 >completed 5 days Azithromycin 8/18 >8/18 Cefepime 8/23 >>  Vancomycin 8/23-->8/25 Interim history/subjective:   Currently off continuous sedating medication.  She did require some pushes of fentanyl overnight for tachycardia and coughing when she was being moved No mental status changes or improvement reported Currently tolerating PSV 8  Objective   Blood pressure (!) 131/92, pulse 86, temperature 99.3 F (37.4 C), resp. rate (!) 21, height 5\' 10"  (1.778 m), weight 98.6 kg, SpO2 96 %, unknown if currently breastfeeding. CVP:  [0 mmHg-9 mmHg] 4 mmHg  Vent Mode: CPAP;PSV FiO2 (%):  [40 %] 40 % Set Rate:  [15 bmp] 15 bmp  Vt Set:  [520 mL] 520 mL PEEP:  [5 cmH20] 5 cmH20 Pressure Support:  [8 cmH20] 8 cmH20 Plateau Pressure:  [15 cmH20-18 cmH20] 15 cmH20   Intake/Output Summary (Last 24 hours) at 10/04/2019 0942 Last data filed at 10/04/2019 0800 Gross per 24 hour  Intake 2200 ml  Output 2300 ml  Net  -100 ml   Filed Weights   10/02/19 0239 10/03/19 0123 10/04/19 0500  Weight: 104.9 kg 102 kg 98.6 kg    Examination: General 34 year old black female ill-appearing Neuro: Grimace with pain, question posturing to pain.  Does not open eyes or track, does not follow commands or have any purposeful movement Pulmonary: Coarse bilaterally, no wheezing Abdomen nondistended, positive bowel sounds Cardiac regular, distant, no murmur Extremities trace pretibial edema GU clear yellow  Resolved Hospital Problem list   Hypokalemia  Assessment & Plan:   VF Arrest New Onset Post Partum Cardiomyopathy, EF 35% HFrEF New SVT on 8/27 Plan On carvedilol, Entresto, spironolactone, BiDil.  Appreciate cardiology management Aspirin and Plavix Digoxin as ordered.  Follow level Replace electrolytes, potassium and magnesium on 8/28  Acute metabolic encephalopathy post cardiac arrest -Currently MRI negative EEG negative for seizures; now off sedation Plan Remains on prophylactic Keppra, question duration Avoiding fever Minimizing sedation  Acute Hypoxemic Respiratory Failure 2/2 to Cardiac Arrest, Pulmonary Edema further complicated by Enterobacter pneumonia (S) to cefepime  Tolerating pressure support ventilation well however mental status precludes extubation  Plan Okay to continue PSV as tolerated but no plans for extubation given mental status VAP prevention order set Cefepime day 6 of 7 for Enterobacter pneumonia She will require tracheostomy to allow long-term ventilation in assessment of neurological improvement   Fever Likely secondary to pneumonia Plan PRN antipyretics  Fluid electrolyte imbalance: Hypokalemia and hypomagnesemia Plan Replace electrolytes on 8/28 Follow BMP  Persistent hypoglycemia- cortisol okay Was placed on D10, now off Plan Tube feeding as ordered Follow CBG  Mild anemia without evidence of bleeding Plan Follow intermittent CBC  Best practice:    Diet: TF Pain/Anxiety/Delirium protocol (if indicated): wean VAP protocol (if indicated): yes DVT prophylaxis: lovenox GI prophylaxis: PPI Glucose control: SSI Mobility: BR Code Status: FULL  Family Communication: updated mother at bedside 8/21 Disposition: ICU  Independent critical care time 32 minutes  Levy Pupa, MD, PhD 10/04/2019, 9:52 AM [facility] [phone] or if no answer [phone]

## 2019-10-05 ENCOUNTER — Inpatient Hospital Stay (HOSPITAL_COMMUNITY): Payer: Medicaid Other

## 2019-10-05 LAB — GLUCOSE, CAPILLARY
Glucose-Capillary: 106 mg/dL — ABNORMAL HIGH (ref 70–99)
Glucose-Capillary: 112 mg/dL — ABNORMAL HIGH (ref 70–99)
Glucose-Capillary: 112 mg/dL — ABNORMAL HIGH (ref 70–99)
Glucose-Capillary: 113 mg/dL — ABNORMAL HIGH (ref 70–99)
Glucose-Capillary: 116 mg/dL — ABNORMAL HIGH (ref 70–99)
Glucose-Capillary: 126 mg/dL — ABNORMAL HIGH (ref 70–99)

## 2019-10-05 LAB — CBC
HCT: 32.3 % — ABNORMAL LOW (ref 36.0–46.0)
Hemoglobin: 9.7 g/dL — ABNORMAL LOW (ref 12.0–15.0)
MCH: 25.9 pg — ABNORMAL LOW (ref 26.0–34.0)
MCHC: 30 g/dL (ref 30.0–36.0)
MCV: 86.4 fL (ref 80.0–100.0)
Platelets: 627 10*3/uL — ABNORMAL HIGH (ref 150–400)
RBC: 3.74 MIL/uL — ABNORMAL LOW (ref 3.87–5.11)
RDW: 17.6 % — ABNORMAL HIGH (ref 11.5–15.5)
WBC: 11.4 10*3/uL — ABNORMAL HIGH (ref 4.0–10.5)
nRBC: 0 % (ref 0.0–0.2)

## 2019-10-05 LAB — BASIC METABOLIC PANEL
Anion gap: 6 (ref 5–15)
BUN: 18 mg/dL (ref 6–20)
CO2: 28 mmol/L (ref 22–32)
Calcium: 9.2 mg/dL (ref 8.9–10.3)
Chloride: 102 mmol/L (ref 98–111)
Creatinine, Ser: 0.42 mg/dL — ABNORMAL LOW (ref 0.44–1.00)
GFR calc Af Amer: >60 mL/min (ref >60)
GFR calc non Af Amer: >60 mL/min (ref >60)
Glucose, Bld: 103 mg/dL — ABNORMAL HIGH (ref 70–99)
Potassium: 4.1 mmol/L (ref 3.5–5.1)
Sodium: 136 mmol/L (ref 135–145)

## 2019-10-05 LAB — DIGOXIN LEVEL: Digoxin Level: <0.2 ng/mL — ABNORMAL LOW (ref 0.8–2.0)

## 2019-10-05 LAB — PHOSPHORUS: Phosphorus: 4 mg/dL (ref 2.5–4.6)

## 2019-10-05 LAB — MAGNESIUM: Magnesium: 1.9 mg/dL (ref 1.7–2.4)

## 2019-10-05 MED ORDER — MAGNESIUM SULFATE 2 GM/50ML IV SOLN
2.0000 g | Freq: Once | INTRAVENOUS | Status: AC
  Administered 2019-10-05: 2 g via INTRAVENOUS
  Filled 2019-10-05: qty 50

## 2019-10-05 NOTE — Progress Notes (Signed)
NAME:  Beverly [name], MRN:  [id number], DOB:  [birth year], LOS: 11 ADMISSION DATE:  09/24/2019, CONSULTATION DATE:  8/18 REFERRING MD:  Dr. Eldridge Dace, CHIEF COMPLAINT:  VF arrest   Brief History   34 year old female 2 weeks post partum presented with chest pain and suffered VF arrest.    History of present illness   Patient is encephalopathic and/or intubated. Therefore history has been obtained from chart review.  34 year old female with PMH as below, which is significant for HTN and 2 weeks post-partum. She had emergency C-section delivery due to premature rupture of membranes. She did have some gestational hypertension, but her pregnancy was otherwise uncomplicated. On 8/17 she developed dull chest pain shortly after eating, which prompted ED presentation at [facility]. Resolved spontaneously. No associated symptoms.   Past Medical History   has a past medical history of Hypertension and Vaginal Pap smear, abnormal.  Significant Hospital Events   8/17 chest pain, VF arrest; While in ED, she suffered VT > VF arrest with ROSC after defib x 3 and epi x 1. Immediately after arrest ST elevation was noted on EKG, which did improve on repeat. Differential ACS, SCAD, and toresades/metabolic. Electrolytes were replaced. She was taken to the cath lab at [facility]  Where aneurysmal dilation of the ostial and proximal LAD and was transferred to [facility] for ICU admission.  8/23 possibly febrile. Pan-cultures sent. Still on full support. Gets hypertensive when sedation let up. abx started later in day when temp >101 8/24 opens eyes to stimulus. + strong cough. Tolerating PSV but mental status still not supporting extubation  8/25 about the same  8/27: Seems to be moving a little bit more but mostly posturing.  Opening eyes more.  Had a run of SVT. Consults:    Procedures:  8/18> Central Line, removed 8/26 PICC line placed  Significant Diagnostic Tests:  LHC 8/17 > No coronary artery occlusion  or critical stenosis. There is aneurysmal dilation of the ostial and proximal LAD with possible ulceration or dissection of uncertain chronicity.  TIMI-3 flow is noted throughout the LAD and its branches. Severely reduced left ventricular systolic function with mid/apical anterior, apical, and apical inferior hypokinesis/akinesis. LV gram 30% EF.  CT head 8/17 > normal  CTA chest 8/17 > no evidence of PE, moderate severity infiltrates vs ATX, small bilateral pleural effusions. Very small pericaradial effusion.  CT abdomen/pelvis 8/17 > Enlarged, heterogeneous uterus, Small amount of fluid seen in between the right kidney and right lobe of the liver. Echo 8/18> LVEF 30-35%, septal and apical akinesis distal anterior wall an d mid/distal inferior wall hypokinesis hyperdynamic basal function. LV size mildly dilated.  MRI brain 8/23: Normal  Micro Data:  Blood 8/18 > Respiratory 8/23: Few GPC, rare G VR rare Enterobacter UC 8/23>>> negative Blood 8/23>> Antimicrobials:  Ceftriazone 8/18 >completed 5 days Azithromycin 8/18 >8/18 Cefepime 8/23 >>  Vancomycin 8/23-->8/25 Interim history/subjective:   Off continuous sedation No significant changes reported overnight  Objective   Blood pressure 127/86, pulse 77, temperature 99.7 F (37.6 C), resp. rate 16, height 5\' 10"  (1.778 m), weight 99.5 kg, SpO2 100 %, unknown if currently breastfeeding. CVP:  [3 mmHg-5 mmHg] 4 mmHg  Vent Mode: PSV;CPAP FiO2 (%):  [40 %] 40 % Set Rate:  [15 bmp] 15 bmp Vt Set:  [520 mL] 520 mL PEEP:  [5 cmH20] 5 cmH20 Pressure Support:  [8 cmH20-10 cmH20] 10 cmH20 Plateau Pressure:  [13 cmH20-15 cmH20] 13  cmH20   Intake/Output Summary (Last 24 hours) at 10/05/2019 8676 Last data filed at 10/05/2019 0700 Gross per 24 hour  Intake 1831.5 ml  Output 2980 ml  Net -1148.5 ml   Filed Weights   10/03/19 0123 10/04/19 0500 10/05/19 0327  Weight: 102 kg 98.6 kg 99.5 kg    Examination: General 34 year old black female  obese ill-appearing woman Neuro: Opens eyes to voice, may have tracked, spontaneously moving right upper extremity, does not follow commands Pulmonary: Coarse bilaterally, no wheeze Abdomen nondistended, positive bowel sounds Cardiac regular, distant, no murmur Extremities trace pretibial edema   Resolved Hospital Problem list   Hypokalemia  Assessment & Plan:   VF Arrest New Onset Post Partum Cardiomyopathy, EF 35% HFrEF New SVT on 8/27 Plan Appreciate cardiology management.  Continue carvedilol, Entresto, spironolactone, BiDil Aspirin and Plavix Digoxin as ordered, level remains less than 0.2 Follow BMP, replace electrolytes as indicated  Acute metabolic encephalopathy post cardiac arrest -Currently MRI negative EEG negative for seizures; now off sedation Plan Remains on prophylactic Keppra, question duration for this Avoid fevers Minimizing sedation  Acute Hypoxemic Respiratory Failure 2/2 to Cardiac Arrest, Pulmonary Edema further complicated by Enterobacter pneumonia (S) to cefepime  Tolerating pressure support ventilation well however mental status precludes extubation  Plan PSV as she can tolerate but no plans for extubation due to mental status, airway protection Planning for tracheostomy to allow long-term ventilation, follow-up for neurological prognosis and improvement Cefepime day 7 of 7 for Enterobacter pneumonia on 8/29 VAP prevention order set  Fever Likely secondary to pneumonia Plan PRN antipyretics  Fluid electrolyte imbalance: Hypokalemia and hypomagnesemia Plan Replace electrolytes as indicated Follow BMP, urine output  Persistent hypoglycemia- cortisol okay Was placed on D10, now off Plan Tube feeding as ordered Follow CBG  Mild anemia without evidence of bleeding Plan Follow intermittent CBC  Best practice:  Diet: TF Pain/Anxiety/Delirium protocol (if indicated): continuous sedation off VAP protocol (if indicated): yes DVT  prophylaxis: lovenox GI prophylaxis: PPI Glucose control: SSI Mobility: BR Code Status: FULL  Family Communication:  Disposition: ICU  Independent critical care time 31 minutes  Levy Pupa, MD, PhD 10/05/2019, 9:24 AM [facility] [phone] or if no answer [phone]

## 2019-10-05 NOTE — Progress Notes (Signed)
Radiology recommended PICC retracted back to adjust PICC tip placement. PICC pulled back 2cm, repeat CXR shows PICC tip in satisfactory position at [facility].

## 2019-10-06 ENCOUNTER — Inpatient Hospital Stay (HOSPITAL_COMMUNITY): Payer: Medicaid Other

## 2019-10-06 DIAGNOSIS — I5041 Acute combined systolic (congestive) and diastolic (congestive) heart failure: Secondary | ICD-10-CM | POA: Diagnosis not present

## 2019-10-06 LAB — CBC
HCT: 31.4 % — ABNORMAL LOW (ref 36.0–46.0)
Hemoglobin: 9.4 g/dL — ABNORMAL LOW (ref 12.0–15.0)
MCH: 25.8 pg — ABNORMAL LOW (ref 26.0–34.0)
MCHC: 29.9 g/dL — ABNORMAL LOW (ref 30.0–36.0)
MCV: 86.3 fL (ref 80.0–100.0)
Platelets: 639 10*3/uL — ABNORMAL HIGH (ref 150–400)
RBC: 3.64 MIL/uL — ABNORMAL LOW (ref 3.87–5.11)
RDW: 17.6 % — ABNORMAL HIGH (ref 11.5–15.5)
WBC: 11.1 10*3/uL — ABNORMAL HIGH (ref 4.0–10.5)
nRBC: 0 % (ref 0.0–0.2)

## 2019-10-06 LAB — GLUCOSE, CAPILLARY
Glucose-Capillary: 97 mg/dL (ref 70–99)
Glucose-Capillary: 116 mg/dL — ABNORMAL HIGH (ref 70–99)
Glucose-Capillary: 118 mg/dL — ABNORMAL HIGH (ref 70–99)
Glucose-Capillary: 110 mg/dL — ABNORMAL HIGH (ref 70–99)
Glucose-Capillary: 103 mg/dL — ABNORMAL HIGH (ref 70–99)

## 2019-10-06 LAB — BASIC METABOLIC PANEL
Anion gap: 10 (ref 5–15)
BUN: 13 mg/dL (ref 6–20)
CO2: 28 mmol/L (ref 22–32)
Calcium: 9.4 mg/dL (ref 8.9–10.3)
Chloride: 102 mmol/L (ref 98–111)
Creatinine, Ser: 0.48 mg/dL (ref 0.44–1.00)
GFR calc Af Amer: >60 mL/min (ref >60)
GFR calc non Af Amer: >60 mL/min (ref >60)
Glucose, Bld: 108 mg/dL — ABNORMAL HIGH (ref 70–99)
Potassium: 4 mmol/L (ref 3.5–5.1)
Sodium: 140 mmol/L (ref 135–145)

## 2019-10-06 LAB — COOXEMETRY PANEL
Carboxyhemoglobin: 0.9 % (ref 0.5–1.5)
Methemoglobin: 0.7 % (ref 0.0–1.5)
O2 Saturation: 73 %
Total hemoglobin: 9.9 g/dL — ABNORMAL LOW (ref 12.0–16.0)

## 2019-10-06 LAB — MAGNESIUM: Magnesium: 2 mg/dL (ref 1.7–2.4)

## 2019-10-06 MED ORDER — ENOXAPARIN SODIUM 40 MG/0.4ML ~~LOC~~ SOLN: 40.0000 mg | SUBCUTANEOUS | Status: DC

## 2019-10-06 MED ORDER — MIDAZOLAM HCL 2 MG/2ML IJ SOLN
5.0000 mg | Freq: Once | INTRAMUSCULAR | Status: AC
  Administered 2019-10-06: 2 mg via INTRAVENOUS
  Filled 2019-10-06: qty 6

## 2019-10-06 MED ORDER — LEVETIRACETAM IN NACL 500 MG/100ML IV SOLN
500.0000 mg | Freq: Two times a day (BID) | INTRAVENOUS | Status: AC
  Administered 2019-10-07 (×3): 500 mg via INTRAVENOUS
  Filled 2019-10-06 (×3): qty 100

## 2019-10-06 MED ORDER — ACETAMINOPHEN 160 MG/5ML PO SOLN
650.0000 mg | ORAL | Status: DC | PRN
  Administered 2019-10-12 – 2019-11-02 (×7): 650 mg
  Filled 2019-10-06 (×9): qty 20.3

## 2019-10-06 MED ORDER — CEFAZOLIN SODIUM-DEXTROSE 2-4 GM/100ML-% IV SOLN
2.0000 g | INTRAVENOUS | Status: AC
  Administered 2019-10-07: 2 g via INTRAVENOUS
  Filled 2019-10-06: qty 100

## 2019-10-06 MED ORDER — VECURONIUM BROMIDE 10 MG IV SOLR
10.0000 mg | Freq: Once | INTRAVENOUS | Status: AC
  Administered 2019-10-06: 10 mg via INTRAVENOUS
  Filled 2019-10-06: qty 10

## 2019-10-06 MED ORDER — FENTANYL CITRATE (PF) 100 MCG/2ML IJ SOLN
200.0000 ug | Freq: Once | INTRAMUSCULAR | Status: AC
  Administered 2019-10-06: 100 ug via INTRAVENOUS
  Filled 2019-10-06: qty 4

## 2019-10-06 MED ORDER — ETOMIDATE 2 MG/ML IV SOLN
40.0000 mg | Freq: Once | INTRAVENOUS | Status: AC
  Administered 2019-10-06: 20 mg via INTRAVENOUS
  Filled 2019-10-06: qty 20

## 2019-10-06 MED ORDER — LEVETIRACETAM 100 MG/ML PO SOLN
500.0000 mg | Freq: Two times a day (BID) | ORAL | Status: DC
  Filled 2019-10-06 (×2): qty 5

## 2019-10-06 NOTE — Procedures (Signed)
Diagnostic Bronchoscopy  Beverly [name]  [id number]  May 09, 1985  Date:10/06/19  Time:2:29 PM   Provider Performing:Elif Yonts C Mai Longnecker   Procedure: Diagnostic Bronchoscopy (08022)  Indication(s) Assist with direct visualization of tracheostomy placement  Consent Risks of the procedure as well as the alternatives and risks of each were explained to the patient and/or caregiver.  Consent for the procedure was obtained.   Anesthesia See separate tracheostomy note   Time Out Verified patient identification, verified procedure, site/side was marked, verified correct patient position, special equipment/implants available, medications/allergies/relevant history reviewed, required imaging and test results available.   Sterile Technique Usual hand hygiene, masks, gowns, and gloves were used   Procedure Description Bronchoscope advanced through endotracheal tube and into airway.  After suctioning out tracheal secretions, bronchoscope used to provide direct visualization of tracheostomy placement.   Complications/Tolerance None; patient tolerated the procedure well.   EBL None  Specimen(s) None

## 2019-10-06 NOTE — Consult Note (Signed)
Chief Complaint: Patient was seen in consultation today for percutaneous gastric tube placement at the request of Dr Adaline Sill Smith   Supervising Physician: Ruel FavorsShick, Trevor  Patient Status[facility]  History of Present Illness: Beverly [name] is a 34 y.o. female   Emergent C section secondary premature rupture of membranes  Presented to ED 2 weeks post partum with Chest pain Suffered VF arrest-- [facility]; ROSC after dfib x 3 and epi x 1 [facility] Enterobacter PNA New SVT on 8/27- resolved  Vent and trach Metabolic encephalopathy Resp failure Deconditioning Dysphagia Long term care  Request for percutaneous gastric tube placement Per CCM   Past Medical History:  Diagnosis Date  . Hypertension    last pregnancy  . Vaginal Pap smear, abnormal    when she was 34yo    Past Surgical History:  Procedure Laterality Date  . LEFT HEART CATH AND CORONARY ANGIOGRAPHY N/A 09/23/2019   Procedure: LEFT HEART CATH AND CORONARY ANGIOGRAPHY;  Surgeon: Yvonne KendallEnd, Christopher, MD;  Location: [facility] INVASIVE CV LAB;  Service: Cardiovascular;  Laterality: N/A;    Allergies: Patient has no known allergies.  Medications: Prior to Admission medications   Medication Sig Start Date End Date Taking? Authorizing Provider  ibuprofen (ADVIL) 200 MG tablet Take 400-800 mg by mouth every 6 (six) hours as needed for fever or mild pain.   Yes [provider]  oxyCODONE (OXY IR/ROXICODONE) 5 MG immediate release tablet Take 5 mg by mouth every 4 (four) hours as needed for severe pain.   Yes [provider]  acetaminophen (TYLENOL) 500 MG tablet Take 500-1,000 mg by mouth every 6 (six) hours as needed for mild pain or fever.    [provider]     Family History  Problem Relation Age of Onset  . Hyperlipidemia Mother   . Hypertension Mother   . Diabetes Maternal Grandmother   . Seizures Maternal Grandmother   . Thyroid disease Maternal Grandmother   .  Diabetes Paternal Grandmother   . Rashes / Skin problems Son        ezcema  . Seizures Son     Social History   Socioeconomic History  . Marital status: Single    Spouse name: Not on file  . Number of children: Not on file  . Years of education: Not on file  . Highest education level: Not on file  Occupational History  . Occupation: oncology    Employer: [employer]  Tobacco Use  . Smoking status: Former Games developermoker  . Smokeless tobacco: Never Used  Vaping Use  . Vaping Use: Never used  Substance and Sexual Activity  . Alcohol use: Yes    Alcohol/week: 0.0 standard drinks    Comment: occass  . Drug use: No    Frequency: 5.0 times per week    Types: Marijuana    Comment: stopped when she found out she was pregnant  . Sexual activity: Not Currently    Partners: Male    Birth control/protection: None  Other Topics Concern  . Not on file  Social History Narrative  . Not on file   Social Determinants of Health   Financial Resource Strain:   . Difficulty of Paying Living Expenses: Not on file  Food Insecurity:   . Worried About Programme researcher, broadcasting/film/videounning Out of Food in the Last Year: Not on file  . Ran Out of Food in the Last Year: Not on file  Transportation Needs:   . Lack of  Transportation (Medical): Not on file  . Lack of Transportation (Non-Medical): Not on file  Physical Activity:   . Days of Exercise per Week: Not on file  . Minutes of Exercise per Session: Not on file  Stress:   . Feeling of Stress : Not on file  Social Connections:   . Frequency of Communication with Friends and Family: Not on file  . Frequency of Social Gatherings with Friends and Family: Not on file  . Attends Religious Services: Not on file  . Active Member of Clubs or Organizations: Not on file  . Attends Banker Meetings: Not on file  . Marital Status: Not on file    Review of Systems: A 12 point ROS discussed and pertinent positives are indicated in the HPI above.  All other systems are  negative.   Vital Signs: BP 126/80   Pulse 88   Temp 99.5 F (37.5 C) (Oral)   Resp 17   Ht 5\' 10"  (1.778 m)   Wt 218 lb 4.1 oz (99 kg)   SpO2 100%   BMI 31.32 kg/m   Physical Exam Vitals reviewed.  Cardiovascular:     Rate and Rhythm: Normal rate and regular rhythm.  Pulmonary:     Comments: vent Skin:    General: Skin is warm.  Neurological:     Comments: No response to me  Psychiatric:     Comments: Mother at bedside     Imaging: EEG  Result Date: 09/28/2019 Rejeana Brock, MD     09/28/2019 11:16 AM History: 34 year old female with concern for post anoxic encephalopathy Sedation: Propofol Technique: This is a 21 channel routine scalp EEG performed at the bedside with bipolar and monopolar montages arranged in accordance to the international 10/20 system of electrode placement. One channel was dedicated to EKG recording. Background: The beginning of the EEG appears most consistent with a sedated EEG with frontocentrally, runs of alpha range activity.  Following stimulation/reduction of propofol, she does have some generalized irregular delta and theta range activities which become more prominent after stimulation(reactive EEG), but no definite posterior dominant rhythm is seen. Photic stimulation: Physiologic driving is not performed EEG Abnormalities: 1) generalized irregular delta and theta range activities 2) absent posterior dominant rhythm Clinical Interpretation: This EEG is consistent with a generalized nonspecific cerebral dysfunction as can be seen with propofol sedation, anoxic encephalopathy, toxic/metabolic encephalopathy, among other causes.  There was reactivity to external stimuli seen on the study. There was no seizure or seizure predisposition recorded on this study. Please note that lack of epileptiform activity on EEG does not preclude the possibility of epilepsy. Ritta Slot, MD [facility] [phone] If 7pm- 7am, please page  neurology on call as listed in AMION.   DG Chest 1 View  Result Date: 09/24/2019 CLINICAL DATA:  Post cardiac arrest. EXAM: CHEST  1 VIEW COMPARISON:  Radiograph and CT yesterday. FINDINGS: Endotracheal tube tip 2.3 cm from the carina. Enteric tube tip and side-port below the diaphragm not included in the field of view. Similar cardiomegaly, pericardial effusion on prior CT. Slight progression in diffuse bilateral airspace opacities. More confluent retrocardiac opacity. Small pleural effusions which were better seen on CT. No pneumothorax. IMPRESSION: 1. Endotracheal tube tip 2.3 cm from the carina. Enteric tube tip and side-port below the diaphragm not included in the field of view. 2. Slight progression in diffuse bilateral airspace opacities, pulmonary edema versus multifocal pneumonia. 3. Similar cardiomegaly, pericardial effusion on prior CT. Electronically Signed  By: Narda Rutherford M.D.   On: 09/24/2019 01:37   DG Chest 2 View  Result Date: 09/23/2019 CLINICAL DATA:  34 year old female with chest pain EXAM: CHEST - 2 VIEW COMPARISON:  None. FINDINGS: Small bilateral pleural effusions, right greater left. Minimal bibasilar atelectasis. Infiltrate is not excluded clinical correlation is recommended. No pneumothorax. There is mild cardiomegaly. No acute osseous pathology. IMPRESSION: 1. Small bilateral pleural effusions, right greater left. 2. Mild cardiomegaly. Electronically Signed   By: Elgie Collard M.D.   On: 09/23/2019 16:19   DG Abdomen 1 View  Result Date: 09/23/2019 CLINICAL DATA:  Intubated, enteric catheter placement EXAM: ABDOMEN - 1 VIEW COMPARISON:  None. FINDINGS: Two frontal views of the lower chest and upper abdomen demonstrates enteric catheter tip projecting over gastric body. Defibrillator pad overlies the cardiac apex. Bowel gas pattern is unremarkable. IMPRESSION: 1. Enteric catheter overlying gastric body. Electronically Signed   By: Sharlet Salina M.D.   On: 09/23/2019  18:43   CT Head Wo Contrast  Result Date: 09/23/2019 CLINICAL DATA:  Cardiac arrest EXAM: CT HEAD WITHOUT CONTRAST TECHNIQUE: Contiguous axial images were obtained from the base of the skull through the vertex without intravenous contrast. COMPARISON:  None. FINDINGS: Brain: There is no mass, hemorrhage or extra-axial collection. The size and configuration of the ventricles and extra-axial CSF spaces are normal. The brain parenchyma is normal, without acute or chronic infarction. Punctate colloid cyst at the level of the foramina of [place] is incidentally noted. Vascular: No abnormal hyperdensity of the major intracranial arteries or dural venous sinuses. No intracranial atherosclerosis. Skull: The visualized skull base, calvarium and extracranial soft tissues are normal. Sinuses/Orbits: No fluid levels or advanced mucosal thickening of the visualized paranasal sinuses. No mastoid or middle ear effusion. The orbits are normal. IMPRESSION: Normal head CT. Electronically Signed   By: Deatra Robinson M.D.   On: 09/23/2019 20:43   CT Angio Chest PE W/Cm &/Or Wo Cm  Result Date: 09/23/2019 CLINICAL DATA:  Chest pain. COVID positive. EXAM: CT ANGIOGRAPHY CHEST WITH CONTRAST TECHNIQUE: Multidetector CT imaging of the chest was performed using the standard protocol during bolus administration of intravenous contrast. Multiplanar CT image reconstructions and MIPs were obtained to evaluate the vascular anatomy. CONTRAST:  OMNIPAQUE IOHEXOL 350 MG/ML SOLN COMPARISON:  None. FINDINGS: Cardiovascular: Satisfactory opacification of the pulmonary arteries to the segmental level. No evidence of pulmonary embolism. Normal heart size. A very small pericardial effusion is present. Mediastinum/Nodes: No enlarged mediastinal, hilar, or axillary lymph nodes. Endotracheal and nasogastric tubes are in place. Thyroid gland, trachea, and esophagus demonstrate no significant findings. Lungs/Pleura: Moderate severity infiltrates  and/or atelectasis are seen within the bilateral upper lobes, right greater than left, and posterior aspects of the bilateral lower lobes, left greater than right. Small bilateral pleural effusions are seen. No pneumothorax is identified. Upper Abdomen: A mild amount of fluid is suspected within the region in between the right kidney and liver. Musculoskeletal: No chest wall abnormality. No acute or significant osseous findings. Review of the MIP images confirms the above findings. IMPRESSION: 1. No CT evidence of acute pulmonary embolism. 2. Moderate severity infiltrates and/or atelectasis along the bilateral upper lobes, right greater than left, and posterior aspects of the bilateral lower lobes, left greater than right. 3. Small bilateral pleural effusions. 4. Very small pericardial effusion. 5. Mild amount of fluid suspected within the region in between the right kidney and liver. Electronically Signed   By: Aram Candela M.D.   On:  09/23/2019 20:43   MR BRAIN WO CONTRAST  Result Date: 09/29/2019 CLINICAL DATA:  Anoxic brain injury EXAM: MRI HEAD WITHOUT CONTRAST TECHNIQUE: Multiplanar, multiecho pulse sequences of the brain and surrounding structures were obtained without intravenous contrast. COMPARISON:  None. FINDINGS: BRAIN: No acute infarct, acute hemorrhage or extra-axial collection. Normal white matter signal. Normal volume of CSF spaces. No chronic microhemorrhage. Normal midline structures. VASCULAR: Major flow voids are preserved. SKULL AND UPPER CERVICAL SPINE: Normal calvarium and skull base. Visualized upper cervical spine and soft tissues are normal. SINUSES/ORBITS: No paranasal sinus fluid levels or advanced mucosal thickening. No mastoid or middle ear effusion. Normal orbits. IMPRESSION: Normal brain MRI. Electronically Signed   By: Deatra Robinson M.D.   On: 09/29/2019 03:49   CT Abdomen Pelvis W Contrast  Result Date: 09/23/2019 CLINICAL DATA:  Abdominal pain. EXAM: CT ABDOMEN AND  PELVIS WITH CONTRAST TECHNIQUE: Multidetector CT imaging of the abdomen and pelvis was performed using the standard protocol following bolus administration of intravenous contrast. CONTRAST:  OMNIPAQUE IOHEXOL 350 MG/ML SOLN COMPARISON:  None. FINDINGS: Lower chest: Moderate severity areas of atelectasis and/or infiltrate are seen within the posterior aspect of the bilateral lung bases, left greater than right. Small bilateral pleural effusions are seen. A small pericardial effusion is noted. Hepatobiliary: No focal liver abnormality is seen. No gallstones, gallbladder wall thickening, or biliary dilatation. Pancreas: Unremarkable. No pancreatic ductal dilatation or surrounding inflammatory changes. Spleen: Normal in size without focal abnormality. Adrenals/Urinary Tract: Adrenal glands are unremarkable. Kidneys are normal, without renal calculi, focal lesion, or hydronephrosis. A Foley catheter is seen within the lumen of the urinary bladder. Stomach/Bowel: A nasogastric tube is seen with its distal tip noted within the body of the stomach. Stomach is within normal limits. Appendix appears normal. No evidence of bowel dilatation. Vascular/Lymphatic: No significant vascular findings are present. No enlarged abdominal or pelvic lymph nodes. Reproductive: The uterus is enlarged and heterogeneous in appearance. Other: A C-section scar seen along the midline of the anterior pelvic wall. A small amount of fluid is seen in between the right kidney and right lobe of the liver, as well as within the posterior aspect of the pelvis. Musculoskeletal: No acute or significant osseous findings. IMPRESSION: 1. Moderate severity areas of atelectasis and/or infiltrate within the posterior aspect of the bilateral lung bases, left greater than right, with small bilateral pleural effusions. 2. Small pericardial effusion. 3. Enlarged, heterogeneous uterus, likely representing a fibroid uterus. 4. Small amount of fluid seen in  between the right kidney and right lobe of the liver, as well as within the posterior aspect of the pelvis. Electronically Signed   By: Aram Candela M.D.   On: 09/23/2019 20:48   CARDIAC CATHETERIZATION  Result Date: 09/23/2019 Conclusions: 1. No coronary artery occlusion or critical stenosis. 2. There is aneurysmal dilation of the ostial and proximal LAD with possible ulceration or dissection of uncertain chronicity.  TIMI-3 flow is noted throughout the LAD and its branches. 3. No angiographically significant disease involving the LCx or RCA. 4. Severely reduced left ventricular systolic function with mid/apical anterior, apical, and apical inferior hypokinesis/akinesis. 5. Severely elevated left ventricular filling pressure. Recommendations: 1. Continue indefinite aspirin 81 mg daily.  Defer P2Y12 inhibitor given recent acute blood loss anemia in the setting of c-section. 2. Defer IV heparin given concern for possible dissection involving aneurysmal segment of proximal LAD. 3. Transfer to [facility] for continued management and surgical consultation/intervention if the patient were to become unstable with  evidence of compromised flow in the LAD. 4. Continue amiodarone infusion. 5. Continue diuresis; patient received furosemide 40 mg IV x 1 at the end of catheterization. 6. Add evidence based medical therapy for heart failure as blood pressure tolerates and the patient is better compensated. Yvonne Kendall, MD [facility]   DG Chest Port 1 View  Result Date: 10/05/2019 CLINICAL DATA:  S/P PICC central line placement EXAM: PORTABLE CHEST 1 VIEW COMPARISON:  Chest radiograph 10/05/2019 FINDINGS: Interval repositioning of a right upper extremity PICC with tip projecting at the cavoatrial junction. Otherwise stable support apparatus. No pneumothorax. Low lung volumes. Stable cardiomediastinal contours with enlarged heart size. Possible left basilar atelectasis. IMPRESSION: Interval repositioning of a  right upper extremity PICC with tip projecting at the cavoatrial junction. Electronically Signed   By: Emmaline Kluver M.D.   On: 10/05/2019 16:44   DG Chest Port 1 View  Result Date: 10/05/2019 CLINICAL DATA:  Follow-up diffuse pulmonary opacities. EXAM: PORTABLE CHEST 1 VIEW COMPARISON:  September 29, 2019 FINDINGS: The ETT is in good position. The NG tube terminates below today's film. A new right PICC line terminates within the right side of the heart, approximately 5 cm deep to the caval atrial junction. Stable cardiomegaly. The hila and mediastinum are unchanged. No pneumothorax. No overt edema. No focal infiltrates. IMPRESSION: 1. A new right PICC line terminates 5 cm into the right side of the heart. Recommend withdrawing 5 cm. Other support apparatus as above. 2. No other acute abnormalities are identified. These results will be called to the ordering clinician or representative by the Radiologist Assistant, and communication documented in the PACS or [employer]. Electronically Signed   By: Gerome Sam III M.D   On: 10/05/2019 10:43   DG Chest Port 1 View  Result Date: 09/29/2019 CLINICAL DATA:  Intubated EXAM: PORTABLE CHEST 1 VIEW COMPARISON:  09/24/2019 FINDINGS: Unchanged support apparatus including endotracheal tube, esophagogastric tube and left neck vascular catheter. Interval improvement in diffuse interstitial and heterogeneous airspace opacity. There may be small persistent layering pleural effusions and or atelectasis. Cardiomegaly. IMPRESSION: Interval improvement in diffuse interstitial and heterogeneous airspace opacity. There may be small persistent layering pleural effusions and or atelectasis. No new airspace opacity. Electronically Signed   By: Lauralyn Primes M.D.   On: 09/29/2019 11:15   DG CHEST PORT 1 VIEW  Result Date: 09/24/2019 CLINICAL DATA:  Central line EXAM: PORTABLE CHEST 1 VIEW COMPARISON:  Earlier today FINDINGS: New left IJ line with tip at the upper right  atrium. No pneumothorax or mediastinal widening. Enteric tube which reaches the stomach, which remains gas distended. Endotracheal tube tip is just below the clavicular heads. Cardiopericardial enlargement and bilateral airspace disease. IMPRESSION: 1. New central line with tip at the upper right atrium. No pneumothorax. 2. Unremarkable enteric tube positioning. The stomach remains gas distended. 3. Stable airspace disease and cardiac enlargement. Electronically Signed   By: Marnee Spring M.D.   On: 09/24/2019 04:22   DG Chest Portable 1 View  Result Date: 09/23/2019 CLINICAL DATA:  Chest pain, intubated EXAM: PORTABLE CHEST 1 VIEW COMPARISON:  09/23/2019 FINDINGS: Single frontal view of the chest demonstrates endotracheal tube overlying tracheal air column tip 1.6 cm above carina. Enteric catheter passes below the diaphragm tip excluded by collimation. External defibrillator pads overlie the cardiac apex. The cardiac silhouette is enlarged but stable. Progressive central vascular congestion with small bilateral pleural effusions. No pneumothorax. IMPRESSION: 1. Support devices as above. 2. Progressive central vascular congestion with  trace bilateral pleural effusion. Electronically Signed   By: Sharlet Salina M.D.   On: 09/23/2019 18:42   DG Abd Portable 1V  Result Date: 09/29/2019 CLINICAL DATA:  Status post orogastric tube adjustment. EXAM: PORTABLE ABDOMEN - 1 VIEW COMPARISON:  09/23/2019 FINDINGS: An enteric tube has been advanced and now terminates over the distal aspect of the stomach with side hole projecting over the gastric body. No dilated loops of bowel are seen in the included portion of the abdomen to suggest obstruction. A central venous catheter terminates near the superior cavoatrial junction. Mild left basilar lung opacity is incompletely evaluated though may reflect atelectasis. IMPRESSION: Advancement of enteric tube as above. Electronically Signed   By: Sebastian Ache M.D.   On:  09/29/2019 08:54   EEG adult  Result Date: 09/24/2019 Charlsie Quest, MD     09/24/2019  9:55 AM Patient Name: DAPHNEY HOPKE MRN: [id number] Epilepsy Attending: Charlsie Quest Referring Physician/Provider: Dr. Lindie Spruce Date: 09/24/2019 Duration: 25.44 mins Patient history: 34 year old female status post cardiac arrest and seizure-like activity.  EEG to assess for seizures. Level of alertness: Comatose AEDs during EEG study: Keppra, propofol Technical aspects: This EEG study was done with scalp electrodes positioned according to the 10-20 International system of electrode placement. Electrical activity was acquired at a sampling rate of 500Hz  and reviewed with a high frequency filter of 70Hz  and a low frequency filter of 1Hz . EEG data were recorded continuously and digitally stored. Description: EEG showed continuous generalized 3 to 5 Hz theta-delta slowing which at times appeared sharply contoured.  EEG was reactive to tactile stimulation.  Hyperventilation and photic stimulation were not performed.   ABNORMALITY -Continuous slow, generalized IMPRESSION: This study is suggestive of severe diffuse encephalopathy, nonspecific to etiology.  No seizures or definite epileptiform discharges were seen throughout the recording. Priyanka [name]   Overnight EEG with video  Result Date: 09/25/2019 Charlsie Quest, MD     09/26/2019  9:52 AM Patient Name: KAILA DEVRIES MRN: [id number] Epilepsy Attending: Charlsie Quest Referring Physician/Provider: Dr. Lindie Spruce Duration: 09/24/2019 0950 to 09/25/2019 0950  Patient history: 34 year old female status post cardiac arrest and seizure-like activity.  EEG to assess for seizures.  Level of alertness: Comatose  AEDs during EEG study: Keppra, propofol  Technical aspects: This EEG study was done with scalp electrodes positioned according to the 10-20 International system of electrode placement. Electrical activity was acquired at a sampling  rate of 500Hz  and reviewed with a high frequency filter of 70Hz  and a low frequency filter of 1Hz . EEG data were recorded continuously and digitally stored.  Description: EEG showed continuous generalized 3 to 5 Hz theta-delta slowing which at times appeared sharply contoured. EEG was reactive to tactile stimulation.  Hyperventilation and photic stimulation were not performed.  Event button was pressed on 09/25/2019 at 0339 for unclear reasons. Concomitant eeg before, during and after the event didn't show any eeg change to suggest seizure.  ABNORMALITY -Continuous slow, generalized  IMPRESSION: This study is suggestive of severe diffuse encephalopathy, nonspecific to etiology.  No seizures or definite epileptiform discharges were seen throughout the recording.   Charlsie Quest   ECHOCARDIOGRAM COMPLETE  Result Date: 09/24/2019    ECHOCARDIOGRAM REPORT   Patient Name:   RHAYNE CHATWIN Date of Exam: 09/24/2019 Medical Rec #:  [id number]        Height:       70.0 in Accession #:    [id number]  Weight:       227.3 lb Date of Birth:  [birth year]         BSA:          2.204 m Patient Age:    34 years         BP:           125/83 mmHg Patient Gender: F                HR:           108 bpm. Exam Location:  Inpatient Procedure: 2D Echo Indications:    cardiac arrest  History:        Patient has no prior history of Echocardiogram examinations.                 Arrythmias:V-Tach.  Sonographer:    Delcie Roch Referring Phys: [id number] MAHESH A Izora Ribas  Sonographer Comments: Echo performed with patient supine and on artificial respirator. Image acquisition challenging due to respiratory motion and Image acquisition challenging due to patient body habitus. IMPRESSIONS  1. Septal and apical akinesis Distal anterior wall and mid/distal inferior wall hypokinesis Hyperdynamic basal function . Left ventricular ejection fraction, by estimation, is 30 to 35%. The left ventricle has moderately decreased function.  The left ventricle demonstrates regional wall motion abnormalities (see scoring diagram/findings for description). The left ventricular internal cavity size was mildly dilated. Left ventricular diastolic parameters are indeterminate.  2. Right ventricular systolic function is normal. The right ventricular size is normal.  3. The mitral valve is normal in structure. Trivial mitral valve regurgitation. No evidence of mitral stenosis.  4. The aortic valve was not well visualized. Aortic valve regurgitation is not visualized. No aortic stenosis is present.  5. The inferior vena cava is normal in size with greater than 50% respiratory variability, suggesting right atrial pressure of 3 mmHg. FINDINGS  Left Ventricle: Septal and apical akinesis Distal anterior wall and mid/distal inferior wall hypokinesis Hyperdynamic basal function. Left ventricular ejection fraction, by estimation, is 30 to 35%. The left ventricle has moderately decreased function. The left ventricle demonstrates regional wall motion abnormalities. The left ventricular internal cavity size was mildly dilated. There is no left ventricular hypertrophy. Left ventricular diastolic parameters are indeterminate. Right Ventricle: The right ventricular size is normal. No increase in right ventricular wall thickness. Right ventricular systolic function is normal. Left Atrium: Left atrial size was normal in size. Right Atrium: Right atrial size was normal in size. Pericardium: Trivial pericardial effusion is present. The pericardial effusion is lateral to the left ventricle. Mitral Valve: The mitral valve is normal in structure. Normal mobility of the mitral valve leaflets. Trivial mitral valve regurgitation. No evidence of mitral valve stenosis. Tricuspid Valve: The tricuspid valve is normal in structure. Tricuspid valve regurgitation is not demonstrated. No evidence of tricuspid stenosis. Aortic Valve: The aortic valve was not well visualized. Aortic valve  regurgitation is not visualized. No aortic stenosis is present. Pulmonic Valve: The pulmonic valve was normal in structure. Pulmonic valve regurgitation is not visualized. No evidence of pulmonic stenosis. Aorta: The aortic root is normal in size and structure. Venous: The inferior vena cava is normal in size with greater than 50% respiratory variability, suggesting right atrial pressure of 3 mmHg. IAS/Shunts: No atrial level shunt detected by color flow Doppler.  LEFT VENTRICLE PLAX 2D LVIDd:         5.10 cm LVIDs:         3.70 cm LV PW:  1.00 cm LV IVS:        1.10 cm LVOT diam:     2.10 cm LV SV:         56 LV SV Index:   26 LVOT Area:     3.46 cm  LV Volumes (MOD) LV vol d, MOD A4C: 112.0 ml LV vol s, MOD A4C: 73.3 ml LV SV MOD A4C:     112.0 ml RIGHT VENTRICLE RV S prime:     13.30 cm/s TAPSE (M-mode): 2.2 cm LEFT ATRIUM             Index       RIGHT ATRIUM           Index LA diam:        3.10 cm 1.41 cm/m  RA Area:     14.70 cm LA Vol (A2C):   64.1 ml 29.08 ml/m RA Volume:   38.80 ml  17.60 ml/m LA Vol (A4C):   70.3 ml 31.90 ml/m LA Biplane Vol: 74.0 ml 33.58 ml/m  AORTIC VALVE LVOT Vmax:   93.30 cm/s LVOT Vmean:  66.000 cm/s LVOT VTI:    0.163 m  AORTA Ao Root diam: 3.30 cm Ao Asc diam:  2.80 cm  SHUNTS Systemic VTI:  0.16 m Systemic Diam: 2.10 cm Charlton Haws MD Electronically signed by Charlton Haws MD Signature Date/Time: 09/24/2019/4:08:10 PM    Final    [place] EKG SITE RITE  Result Date: 10/01/2019 If Site Rite image not attached, placement could not be confirmed due to current cardiac rhythm.   Labs:  CBC: Recent Labs    10/01/19 0530 10/02/19 0349 10/05/19 0403 10/06/19 0355  WBC 9.2 7.6 11.4* 11.1*  HGB 8.5* 8.2* 9.7* 9.4*  HCT 29.0* 28.2* 32.3* 31.4*  PLT 544* 524* 627* 639*    COAGS: Recent Labs    09/23/19 1844 09/24/19 0824  INR 1.3* 1.2  APTT 45* 32    BMP: Recent Labs    10/03/19 0308 10/04/19 0332 10/05/19 0403 10/06/19 0355  NA 141 138 136 140  K  3.9 3.5 4.1 4.0  CL 104 103 102 102  CO2 27 27 28 28   GLUCOSE 117* 126* 103* 108*  BUN 13 13 18 13   CALCIUM 9.0 8.9 9.2 9.4  CREATININE 0.45 0.48 0.42* 0.48  GFRNONAA >60 >60 >60 >60  GFRAA >60 >60 >60 >60    LIVER FUNCTION TESTS: Recent Labs    09/24/19 0039 09/25/19 0325 09/27/19 0321  BILITOT 0.6 0.7 0.6  AST 200* 151* 41  ALT 69* 64* 40  ALKPHOS 99 95 84  PROT 6.9 6.9 6.6  ALBUMIN 2.7* 2.7* 2.4*    TUMOR MARKERS: No results for input(s): AFPTM, CEA, CA199, CHROMGRNA in the last 8760 hours.  Assessment and Plan:  Cardiac arrest 2 week post partum VF and ROSC after dfib x 3 and epi x 1 Vent-- for trach Deconditioning Dysphagia Need for long term care Scheduled for percutaneous gastric tube placement Risks and benefits image guided gastrostomy tube placement was discussed with the patient's mother at bedside including, but not limited to the need for a barium enema during the procedure, bleeding, infection, peritonitis and/or damage to adjacent structures.  All questions were answered, she is agreeable to proceed. Consent signed and in chart.   Thank you for this interesting consult.  I greatly enjoyed meeting Beverly [name] and look forward to participating in their care.  A copy of this report was sent to the requesting  provider on this date.  Electronically Signed: Robet Leu, PA-C 10/06/2019, 1:20 PM   I spent a total of 20 Minutes    in face to face in clinical consultation, greater than 50% of which was counseling/coordinating care for percutaneous gastric tube placement

## 2019-10-06 NOTE — Progress Notes (Signed)
NAME:  Beverly [name], MRN:  [id number], DOB:  [birth year], LOS: 12 ADMISSION DATE:  09/24/2019, CONSULTATION DATE:  8/18 REFERRING MD:  Dr. Eldridge Dace, CHIEF COMPLAINT:  VF arrest   Brief History   34 year old female 2 weeks post partum presented with chest pain and suffered VF arrest.    History of present illness   Patient is encephalopathic and/or intubated. Therefore history has been obtained from chart review.  34 year old female with PMH as below, which is significant for HTN and 2 weeks post-partum. She had emergency C-section delivery due to premature rupture of membranes. She did have some gestational hypertension, but her pregnancy was otherwise uncomplicated. On 8/17 she developed dull chest pain shortly after eating, which prompted ED presentation at [facility]. Resolved spontaneously. No associated symptoms.   Past Medical History   has a past medical history of Hypertension and Vaginal Pap smear, abnormal.  Significant Hospital Events   8/17 chest pain, VF arrest; While in ED, she suffered VT > VF arrest with ROSC after defib x 3 and epi x 1. Immediately after arrest ST elevation was noted on EKG, which did improve on repeat. Differential ACS, SCAD, and toresades/metabolic. Electrolytes were replaced. She was taken to the cath lab at [facility]  Where aneurysmal dilation of the ostial and proximal LAD and was transferred to [facility] for ICU admission.  8/23 possibly febrile. Pan-cultures sent. Still on full support. Gets hypertensive when sedation let up. abx started later in day when temp >101 8/24 opens eyes to stimulus. + strong cough. Tolerating PSV but mental status still not supporting extubation  8/25 about the same  8/27: Seems to be moving a little bit more but mostly posturing.  Opening eyes more.  Had a run of SVT. Consults:    Procedures:  8/18> Central Line, removed 8/26 PICC line placed  Significant Diagnostic Tests:  LHC 8/17 > No coronary artery occlusion  or critical stenosis. There is aneurysmal dilation of the ostial and proximal LAD with possible ulceration or dissection of uncertain chronicity.  TIMI-3 flow is noted throughout the LAD and its branches. Severely reduced left ventricular systolic function with mid/apical anterior, apical, and apical inferior hypokinesis/akinesis. LV gram 30% EF.  CT head 8/17 > normal  CTA chest 8/17 > no evidence of PE, moderate severity infiltrates vs ATX, small bilateral pleural effusions. Very small pericaradial effusion.  CT abdomen/pelvis 8/17 > Enlarged, heterogeneous uterus, Small amount of fluid seen in between the right kidney and right lobe of the liver. Echo 8/18> LVEF 30-35%, septal and apical akinesis distal anterior wall an d mid/distal inferior wall hypokinesis hyperdynamic basal function. LV size mildly dilated.  MRI brain 8/23: Normal  Micro Data:  Blood 8/18 > Respiratory 8/23: Few GPC, rare G VR rare Enterobacter UC 8/23>>> negative Blood 8/23>> Antimicrobials:  Ceftriazone 8/18 >completed 5 days Azithromycin 8/18 >8/18 Cefepime 8/23 >>  Vancomycin 8/23-->8/25 Interim history/subjective:  No events. Opens eyes to voice, cannot get to track.   Objective   Blood pressure 122/75, pulse 82, temperature 99.7 F (37.6 C), temperature source Oral, resp. rate 17, height 5\' 10"  (1.778 m), weight 99 kg, SpO2 100 %, unknown if currently breastfeeding. CVP:  [1 mmHg-5 mmHg] 3 mmHg  Vent Mode: CPAP;PSV FiO2 (%):  [30 %-40 %] 30 % Set Rate:  [15 bmp] 15 bmp Vt Set:  [520 mL] 520 mL PEEP:  [5 cmH20] 5 cmH20 Pressure Support:  [5 cmH20-10 cmH20] 5 cmH20 Plateau  Pressure:  [16 cmH20] 16 cmH20   Intake/Output Summary (Last 24 hours) at 10/06/2019 0940 Last data filed at 10/06/2019 0800 Gross per 24 hour  Intake 1907.49 ml  Output 3135 ml  Net -1227.51 ml   Filed Weights   10/04/19 0500 10/05/19 0327 10/06/19 0327  Weight: 98.6 kg 99.5 kg 99 kg    Examination: GEN: no acute  distress HEENT: ETT in place, small/moderate secretions CV: RRR, ext warm PULM: Clear, no wheezing GI: Soft, +BS EXT: No edema NEURO: moves RUE occasionally, opens eyes to voice, no tracking for me PSYCH: cannot assess SKIN: no rashes  BMP benign SvO2 73% Afebrile 8/23 resp culture enterobacter, end date for therapy is 8/29  Resolved Hospital Problem list   Hypokalemia  Assessment & Plan:   VF Arrest New Onset Post Partum Cardiomyopathy, EF 35% HFrEF New SVT on 8/27 Plan Appreciate cardiology management.  Continue carvedilol, Entresto, spironolactone, BiDil, digoxin, aspirin Follow BMP, replace electrolytes as indicated  Acute metabolic encephalopathy post cardiac arrest -Currently MRI negative EEG negative for seizures; now off sedation.  Plan is to continue maximal level of care in hopes for recovery. Plan -Keppra to PO, end date 7 days -Avoid fevers -Minimizing sedation -Trach/PEG and vent wean  Acute Hypoxemic Respiratory Failure 2/2 to Cardiac Arrest, Pulmonary Edema further complicated by Enterobacter pneumonia (S) to cefepime  Tolerating pressure support ventilation well however mental status precludes extubation.  Cefepime end date 8/29 for 7 days therapy. Plan - Trach then vent wean  Best practice:  Diet: TF Pain/Anxiety/Delirium protocol (if indicated): continuous sedation off VAP protocol (if indicated): yes DVT prophylaxis: lovenox GI prophylaxis: PPI Glucose control: SSI Mobility: BR Code Status: FULL  Family Communication: will update at bedside Disposition: ICU    The patient is critically ill with multiple organ systems failure and requires high complexity decision making for assessment and support, frequent evaluation and titration of therapies, application of advanced monitoring technologies and extensive interpretation of multiple databases. Critical Care Time devoted to patient care services described in this note independent of APP/resident  time (if applicable)  is 32 minutes.   Myrla Halsted MD [facility] 10/06/2019 9:45 AM Personal pager: [phone] If unanswered, please page CCM On-call: [phone]

## 2019-10-06 NOTE — Progress Notes (Signed)
eLink Physician-Brief Progress Note Patient Name: Beverly [name] DOB: 12/27/1985 MRN: [id number]   Date of Service  10/06/2019  HPI/Events of Note  Patient needs order for Keppra changed to iv as she is currently NPO.  eICU Interventions  Keppra changed to 500 mg iv Q 12 hours.        Thomasene Lot [name] 10/06/2019, 11:54 PM

## 2019-10-06 NOTE — Procedures (Signed)
Bedside Tracheostomy Insertion Procedure Note   Patient Details:   Name: Beverly [name] DOB: Jan 31, 1986 MRN: [id number]  Procedure: Tracheostomy  Pre Procedure Assessment: ET Tube Size: 7 ET Tube secured at lip (cm): 23 Bite block in place: Yes Breath Sounds: Clear and Diminished  Post Procedure Assessment: BP 126/80    Pulse 88    Temp 99.5 F (37.5 C) (Oral)    Resp 17    Ht 5\' 10"  (1.778 m)    Wt 99 kg    SpO2 100%    BMI 31.32 kg/m  O2 sats: stable throughout Complications: No apparent complications Patient did tolerate procedure well Tracheostomy Brand:Shiley Tracheostomy Style:Cuffed Tracheostomy Size: 6 Tracheostomy Secured , velcro Tracheostomy Placement Confirmation:Trach cuff visualized and in place and Chest X ray ordered for placement    BRA:[id number] 10/06/2019, 2:32 PM

## 2019-10-06 NOTE — Procedures (Signed)
Percutaneous Tracheostomy Procedure Note   Beverly [name]  [id number]  Jul 05, 1985  Date:10/06/19  Time:2:39 PM   Provider Performing:Rafay Dahan Celine Mans  Procedure: Percutaneous Tracheostomy with Bronchoscopic Guidance (62703)  Indication(s) Respiratory Failure.  Consent Risks of the procedure as well as the alternatives and risks of each were explained to the patient and/or caregiver.  Consent for the procedure was obtained.  Anesthesia Etomidate, Versed, Fentanyl, Vecuronium   Time Out Verified patient identification, verified procedure, site/side was marked, verified correct patient position, special equipment/implants available, medications/allergies/relevant history reviewed, required imaging and test results available.   Sterile Technique Maximal sterile technique including sterile barrier drape, hand hygiene, sterile gown, sterile gloves, mask, hair covering.   Procedure Description Appropriate anatomy identified by palpation.  Patient's neck prepped and draped in sterile fashion.  1% lidocaine with epinephrine was used to anesthetize skin overlying neck.  1.5cm incision made and blunt dissection performed until tracheal rings could be easily palpated.   Then a size 6 Shiley tracheostomy was placed under bronchoscopic visualization using usual Seldinger technique and serial dilation.   Bronchoscope confirmed placement above the carina.  Tracheostomy was sutured in place with adhesive pad to protect skin under pressure.    Patient connected to ventilator.   Complications/Tolerance None; patient tolerated the procedure well. Chest X-ray is ordered to confirm no post-procedural complication.   EBL Minimal   Specimen(s) None    Rutherford Guys, [place] [facility] 10/06/2019, 2:40 PM

## 2019-10-06 NOTE — Progress Notes (Signed)
SLP Cancellation Note  Patient Details Name: SINDHU NGUYEN MRN: [id number] DOB: [birth year]   Cancelled treatment:         Patient with new tracheostomy. Orders for SLP eval and treat for PMSV and swallowing received. Will follow pt closely for readiness for SLP interventions as appropriate.     Mahala Menghini., M.A. CCC-SLP Acute Rehabilitation Services Pager [phone] Office [phone]  10/06/2019, 3:06 PM

## 2019-10-06 NOTE — Progress Notes (Addendum)
ADVANCED HF PROGRESS  Patient Name: Beverly [name] Date of Encounter: 10/06/2019  [facility] Cardiologist: No primary care provider on file.   Subjective   Remains on vent.   Opens eyes does not follow commands or track  Inpatient Medications    Scheduled Meds: . acetaminophen (TYLENOL) oral liquid 160 mg/5 mL  650 mg Per Tube Q4H  . aspirin  81 mg Per Tube Daily  . atorvastatin  80 mg Per Tube Daily  . carvedilol  6.25 mg Per Tube BID WC  . chlorhexidine gluconate (MEDLINE KIT)  15 mL Mouth Rinse BID  . Chlorhexidine Gluconate Cloth  6 each Topical Daily  . digoxin  0.125 mg Per Tube Daily  . docusate  100 mg Per Tube BID  . enoxaparin (LOVENOX) injection  40 mg Subcutaneous Q24H  . feeding supplement (PROSource TF)  45 mL Per Tube QID  . free water  200 mL Per Tube Q6H  . insulin aspart  0-15 Units Subcutaneous Q4H  . mouth rinse  15 mL Mouth Rinse 10 times per day  . pantoprazole (PROTONIX) IV  40 mg Intravenous Q24H  . polyethylene glycol  17 g Per Tube Daily  . prenatal multivitamin  1 tablet Per Tube Q1200  . sacubitril-valsartan  1 tablet Per Tube BID  . sodium chloride flush  10-40 mL Intracatheter Q12H  . sodium chloride flush  10-40 mL Intracatheter Q12H  . spironolactone  25 mg Per Tube Daily   Continuous Infusions: . sodium chloride 10 mL/hr at 10/06/19 0700  . feeding supplement (VITAL AF 1.2 CAL) 1,000 mL (10/04/19 1230)  . levETIRAcetam 1,500 mg (10/05/19 2156)  . propofol (DIPRIVAN) infusion Stopped (09/30/19 0603)   PRN Meds: fentaNYL (SUBLIMAZE) injection, hydrALAZINE, midazolam, sodium chloride flush, sodium chloride flush   Vital Signs    Vitals:   10/06/19 0400 10/06/19 0500 10/06/19 0600 10/06/19 0700  BP: 115/67 114/78 115/76 119/83  Pulse: 78 83 76 82  Resp: 15 16 16 15   Temp: 99.6 F (37.6 C)     TempSrc: Oral     SpO2: 100% 100% 100% 100%  Weight:      Height:        Intake/Output Summary (Last 24 hours) at 10/06/2019  0726 Last data filed at 10/06/2019 0700 Gross per 24 hour  Intake 1787.49 ml  Output 3245 ml  Net -1457.51 ml   Last 3 Weights 10/06/2019 10/05/2019 10/04/2019  Weight (lbs) 218 lb 4.1 oz 219 lb 5.7 oz 217 lb 6 oz  Weight (kg) 99 kg 99.5 kg 98.6 kg  Some encounter information is confidential and restricted. Go to Review Flowsheets activity to see all data.      Telemetry   SR 80s    Physical Exam   General:  On vent.  HEENT: normal ETT Neck: supple. JVP does not appear elevated. Carotids 2+ bilat; no bruits. No lymphadenopathy or thryomegaly appreciated. Cor: PMI nondisplaced. Regular rate & rhythm. No rubs, gallops or murmurs. Lungs: clear Abdomen: soft, nontender, nondistended. No hepatosplenomegaly. No bruits or masses. Good bowel sounds. Extremities: no cyanosis, clubbing, rash, edema. RLE and LLE SCDs. RUE PICC Neuro: Opens eyes. Does not follow commands.    Labs    High Sensitivity Troponin:   Recent Labs  Lab 09/23/19 1539 09/23/19 1816 09/24/19 0039 09/24/19 0826  TROPONINIHS 127* 2,078* 24,421* >27,000*      Chemistry Recent Labs  Lab 10/04/19 0332 10/05/19 0403 10/06/19 0355  NA 138 136 140  K 3.5 4.1 4.0  CL 103 102 102  CO2 27 28 28   GLUCOSE 126* 103* 108*  BUN 13 18 13   CREATININE 0.48 0.42* 0.48  CALCIUM 8.9 9.2 9.4  GFRNONAA >60 >60 >60  GFRAA >60 >60 >60  ANIONGAP 8 6 10      Hematology Recent Labs  Lab 10/02/19 0349 10/05/19 0403 10/06/19 0355  WBC 7.6 11.4* 11.1*  RBC 3.27* 3.74* 3.64*  HGB 8.2* 9.7* 9.4*  HCT 28.2* 32.3* 31.4*  MCV 86.2 86.4 86.3  MCH 25.1* 25.9* 25.8*  MCHC 29.1* 30.0 29.9*  RDW 17.3* 17.6* 17.6*  PLT 524* 627* 639*    BNP No results for input(s): BNP, PROBNP in the last 168 hours.   DDimer No results for input(s): DDIMER in the last 168 hours.   Radiology    DG Chest Port 1 View  Result Date: 10/05/2019 CLINICAL DATA:  S/P PICC central line placement EXAM: PORTABLE CHEST 1 VIEW COMPARISON:  Chest  radiograph 10/05/2019 FINDINGS: Interval repositioning of a right upper extremity PICC with tip projecting at the cavoatrial junction. Otherwise stable support apparatus. No pneumothorax. Low lung volumes. Stable cardiomediastinal contours with enlarged heart size. Possible left basilar atelectasis. IMPRESSION: Interval repositioning of a right upper extremity PICC with tip projecting at the cavoatrial junction. Electronically Signed   By: Audie Pinto M.D.   On: 10/05/2019 16:44   DG Chest Port 1 View  Result Date: 10/05/2019 CLINICAL DATA:  Follow-up diffuse pulmonary opacities. EXAM: PORTABLE CHEST 1 VIEW COMPARISON:  September 29, 2019 FINDINGS: The ETT is in good position. The NG tube terminates below today's film. A new right PICC line terminates within the right side of the heart, approximately 5 cm deep to the caval atrial junction. Stable cardiomegaly. The hila and mediastinum are unchanged. No pneumothorax. No overt edema. No focal infiltrates. IMPRESSION: 1. A new right PICC line terminates 5 cm into the right side of the heart. Recommend withdrawing 5 cm. Other support apparatus as above. 2. No other acute abnormalities are identified. These results will be called to the ordering clinician or representative by the Radiologist Assistant, and communication documented in the PACS or [employer]. Electronically Signed   By: Dorise Bullion III M.D   On: 10/05/2019 10:43    Cardiac Studies   See section above (reviewed)  Patient Profile     34 y.o. female 2 weeks post-partum with VF/torsades arrest in the setting of anterior MI  Assessment & Plan    1. VF arrest (in-hospital): in setting of anterior infarct. Rhythm has stabilized, off amiodarone, no significant arrhythmia on continuous telemetry. Continue beta-blockade.  - Now rewarmed and off sedation. - EEG 8/20 diffuse slowing but improved - Brain MRI normal. - Vent management per PCCM - Not following commands.  - VT  quiescent. Keep K> 4.0 Mg > 2.0  2. Acute systolic heart failure: secondary to anterior MI, LVEF 35%.  - On carvedilol 6.25 bid, Entresto 97-103bid, dig 0.125 and spiro 25 mg daily.  - Check CO-OX - Can add Bidil as needed 3. CAD/Anterior MI: angio reviewed again. Appears to have aneurysmal proximal LAD with dissection, likely culprit. Had TIMI-3 flow at cath but large infarct. Would anticipate relook catheterization once she recovers, likely medical therapy but will need to reassess.  - Continue ASA,  beta-blocker and statin.  - No evidence of ischemia currently  4. Encephalopathy, post-arrest.Likely anoxic brain injury  - plan as above - agree with plan for trach 5. Hypokalemia/hypomag -  K 4 -Mag 2 6. Hypertension -Stable. 7. Fevers - ? Neurogenic vs infectious - Low grade fever overnight, mTemp 99.6 - BC NGTD - Respiratory cultures growing rare enterobacter aerogenes.  - Completed antibiotics course 8. SVT on 10/03/19 - Resolved     Signed, Darrick Grinder, NP  10/06/2019, 7:26 AM    Agree with above.   Little change in mental status over the weekend. Will open eyes but will not track. Not responding to commands. Continues with low-grade fevers. Co-ox pending. Volume status looks ok. Rhythm stable  General:  On vent. Eyes open. Will not track or follow commands. + gag HEENT: normal + ETT Neck: supple. no JVD. Carotids 2+ bilat; no bruits. No lymphadenopathy or thryomegaly appreciated. Cor: PMI nondisplaced. Regular rate & rhythm. No rubs, gallops or murmurs. Lungs: clear Abdomen: obesesoft, nontender, nondistended. No hepatosplenomegaly. No bruits or masses. Good bowel sounds. Extremities: no cyanosis, clubbing, rash, edema Neuro: On vent. Eyes open. Will not track or follow commands. + gag  Little change in neuro status overnight. Remain concerned for significant neuro injury. HF and rhythm currently stable. No further VT or SVT.   Likely plan for trach with CCM later today.  Will discuss.   CRITICAL CARE Performed by: Glori Bickers  Total critical care time: 35 minutes  Critical care time was exclusive of separately billable procedures and treating other patients.  Critical care was necessary to treat or prevent imminent or life-threatening deterioration.  Critical care was time spent personally by me (independent of midlevel providers or residents) on the following activities: development of treatment plan with patient and/or surrogate as well as nursing, discussions with consultants, evaluation of patient's response to treatment, examination of patient, obtaining history from patient or surrogate, ordering and performing treatments and interventions, ordering and review of laboratory studies, ordering and review of radiographic studies, pulse oximetry and re-evaluation of patient's condition.  Glori Bickers, MD  7:52 AM

## 2019-10-07 ENCOUNTER — Inpatient Hospital Stay (HOSPITAL_COMMUNITY): Payer: Medicaid Other

## 2019-10-07 HISTORY — PX: IR GASTROSTOMY TUBE MOD SED: IMG625

## 2019-10-07 LAB — BASIC METABOLIC PANEL
Anion gap: 12 (ref 5–15)
BUN: 13 mg/dL (ref 6–20)
CO2: 25 mmol/L (ref 22–32)
Calcium: 9.2 mg/dL (ref 8.9–10.3)
Chloride: 103 mmol/L (ref 98–111)
Creatinine, Ser: 0.45 mg/dL (ref 0.44–1.00)
GFR calc Af Amer: >60 mL/min (ref >60)
GFR calc non Af Amer: >60 mL/min (ref >60)
Glucose, Bld: 86 mg/dL (ref 70–99)
Potassium: 3.8 mmol/L (ref 3.5–5.1)
Sodium: 140 mmol/L (ref 135–145)

## 2019-10-07 LAB — PHOSPHORUS: Phosphorus: 4.6 mg/dL (ref 2.5–4.6)

## 2019-10-07 LAB — GLUCOSE, CAPILLARY
Glucose-Capillary: 101 mg/dL — ABNORMAL HIGH (ref 70–99)
Glucose-Capillary: 108 mg/dL — ABNORMAL HIGH (ref 70–99)
Glucose-Capillary: 90 mg/dL (ref 70–99)
Glucose-Capillary: 92 mg/dL (ref 70–99)
Glucose-Capillary: 94 mg/dL (ref 70–99)
Glucose-Capillary: 96 mg/dL (ref 70–99)

## 2019-10-07 LAB — MAGNESIUM: Magnesium: 1.9 mg/dL (ref 1.7–2.4)

## 2019-10-07 LAB — AMMONIA: Ammonia: 15 umol/L (ref 9–35)

## 2019-10-07 MED ORDER — LIDOCAINE HCL 1 % IJ SOLN
INTRAMUSCULAR | Status: AC
  Filled 2019-10-07: qty 20

## 2019-10-07 MED ORDER — ENOXAPARIN SODIUM 40 MG/0.4ML ~~LOC~~ SOLN
40.0000 mg | SUBCUTANEOUS | Status: DC
  Administered 2019-10-07 – 2019-11-28 (×53): 40 mg via SUBCUTANEOUS
  Filled 2019-10-07 (×53): qty 0.4

## 2019-10-07 MED ORDER — LEVETIRACETAM 100 MG/ML PO SOLN
500.0000 mg | Freq: Two times a day (BID) | ORAL | Status: AC
  Administered 2019-10-08 – 2019-10-12 (×10): 500 mg
  Filled 2019-10-07 (×11): qty 5

## 2019-10-07 MED ORDER — LIDOCAINE HCL (PF) 1 % IJ SOLN
INTRAMUSCULAR | Status: AC | PRN
  Administered 2019-10-07: 10 mL

## 2019-10-07 MED ORDER — FENTANYL CITRATE (PF) 100 MCG/2ML IJ SOLN
INTRAMUSCULAR | Status: AC
  Filled 2019-10-07: qty 2

## 2019-10-07 MED ORDER — POTASSIUM CHLORIDE 10 MEQ/50ML IV SOLN
10.0000 meq | INTRAVENOUS | Status: AC
  Administered 2019-10-07 (×4): 10 meq via INTRAVENOUS
  Filled 2019-10-07 (×4): qty 50

## 2019-10-07 MED ORDER — MIDAZOLAM HCL 2 MG/2ML IJ SOLN
INTRAMUSCULAR | Status: AC
  Filled 2019-10-07: qty 2

## 2019-10-07 MED ORDER — MAGNESIUM SULFATE 2 GM/50ML IV SOLN
2.0000 g | Freq: Once | INTRAVENOUS | Status: AC
  Administered 2019-10-07: 2 g via INTRAVENOUS
  Filled 2019-10-07: qty 50

## 2019-10-07 MED ORDER — IOHEXOL 300 MG/ML  SOLN
50.0000 mL | Freq: Once | INTRAMUSCULAR | Status: AC | PRN
  Administered 2019-10-07: 15 mL

## 2019-10-07 MED ORDER — CEFAZOLIN SODIUM-DEXTROSE 2-4 GM/100ML-% IV SOLN
INTRAVENOUS | Status: AC
  Filled 2019-10-07: qty 100

## 2019-10-07 NOTE — Progress Notes (Addendum)
ADVANCED HF PROGRESS  Patient Name: Kayren Eaves Date of Encounter: 10/07/2019  [facility] Cardiologist: No primary care provider on file.   Subjective   S/P Trach 10/06/19    No arrhythmias overnight.   No purposeful movement. + Gag.     Inpatient Medications    Scheduled Meds: . aspirin  81 mg Per Tube Daily  . atorvastatin  80 mg Per Tube Daily  . carvedilol  6.25 mg Per Tube BID WC  . chlorhexidine gluconate (MEDLINE KIT)  15 mL Mouth Rinse BID  . Chlorhexidine Gluconate Cloth  6 each Topical Daily  . digoxin  0.125 mg Per Tube Daily  . docusate  100 mg Per Tube BID  . [START ON 10/08/2019] enoxaparin (LOVENOX) injection  40 mg Subcutaneous Q24H  . feeding supplement (PROSource TF)  45 mL Per Tube QID  . free water  200 mL Per Tube Q6H  . insulin aspart  0-15 Units Subcutaneous Q4H  . mouth rinse  15 mL Mouth Rinse 10 times per day  . pantoprazole (PROTONIX) IV  40 mg Intravenous Q24H  . polyethylene glycol  17 g Per Tube Daily  . prenatal multivitamin  1 tablet Per Tube Q1200  . sacubitril-valsartan  1 tablet Per Tube BID  . sodium chloride flush  10-40 mL Intracatheter Q12H  . sodium chloride flush  10-40 mL Intracatheter Q12H  . spironolactone  25 mg Per Tube Daily   Continuous Infusions: . sodium chloride 10 mL/hr at 10/07/19 0700  .  ceFAZolin (ANCEF) IV    . feeding supplement (VITAL AF 1.2 CAL) Stopped (10/06/19 1300)  . levETIRAcetam Stopped (10/07/19 0110)  . magnesium sulfate bolus IVPB 2 g (10/07/19 0713)  . potassium chloride 10 mEq (10/07/19 0711)  . propofol (DIPRIVAN) infusion Stopped (09/30/19 0603)   PRN Meds: acetaminophen (TYLENOL) oral liquid 160 mg/5 mL, fentaNYL (SUBLIMAZE) injection, hydrALAZINE, midazolam, sodium chloride flush, sodium chloride flush   Vital Signs    Vitals:   10/07/19 0500 10/07/19 0600 10/07/19 0700 10/07/19 0727  BP: 121/72 129/80 125/82   Pulse: 77 93 89   Resp: _0 Temp:    99.6 F (37.6 C)    TempSrc:    Oral  SpO2: 100% 100% 100%   Weight:      Height:        Intake/Output Summary (Last 24 hours) at 10/07/2019 0742 Last data filed at 10/07/2019 0700 Gross per 24 hour  Intake 746.08 ml  Output 1870 ml  Net -1123.92 ml   Last 3 Weights 10/06/2019 10/05/2019 10/04/2019  Weight (lbs) 218 lb 4.1 oz 219 lb 5.7 oz 217 lb 6 oz  Weight (kg) 99 kg 99.5 kg 98.6 kg  Some encounter information is confidential and restricted. Go to Review Flowsheets activity to see all data.      Telemetry   SR 80s    Physical Exam  CVP 4  General: On Vent through trach  HEENT: normal Neck: Trach/ supple. no JVD. Carotids 2+ bilat; no bruits. No lymphadenopathy or thryomegaly appreciated. Cor: PMI nondisplaced. Regular rate & rhythm. No rubs, gallops or murmurs. Lungs: clear Abdomen: soft, nontender, nondistended. No hepatosplenomegaly. No bruits or masses. Good bowel sounds. Extremities: no cyanosis, clubbing, rash, edema Neuro: + gag. Opens eyes. No purposeful movemnt.    Labs    High Sensitivity Troponin:   Recent Labs  Lab 09/23/19 1539 09/23/19 1816 09/24/19 0039 09/24/19 0826  TROPONINIHS 127* 2,078* 24,421* >27,000*  Chemistry Recent Labs  Lab 10/05/19 0403 10/06/19 0355 10/07/19 0353  NA 136 140 140  K 4.1 4.0 3.8  CL 102 102 103  CO2 _0 GLUCOSE 103* 108* 86  BUN _1 CREATININE 0.42* 0.48 0.45  CALCIUM 9.2 9.4 9.2  GFRNONAA >60 >60 >60  GFRAA >60 >60 >60  ANIONGAP _2 Hematology Recent Labs  Lab 10/02/19 0349 10/05/19 0403 10/06/19 0355  WBC 7.6 11.4* 11.1*  RBC 3.27* 3.74* 3.64*  HGB 8.2* 9.7* 9.4*  HCT 28.2* 32.3* 31.4*  MCV 86.2 86.4 86.3  MCH 25.1* 25.9* 25.8*  MCHC 29.1* 30.0 29.9*  RDW 17.3* 17.6* 17.6*  PLT 524* 627* 639*    BNP No results for input(s): BNP, PROBNP in the last 168 hours.   DDimer No results for input(s): DDIMER in the last 168 hours.   Radiology    DG Chest Port 1 View  Result Date:  10/06/2019 CLINICAL DATA:  Status post tracheostomy. EXAM: PORTABLE CHEST 1 VIEW COMPARISON:  Prior chest radiographs 10/05/2019 and earlier. FINDINGS: Interval placement of a tracheostomy tube. The tracheostomy tube tip projects at the level of the clavicular heads. Unchanged position of a right-sided PICC. Unchanged cardiomegaly. New from the prior examination there is opacity at the right lung base which may reflect atelectasis and/or pleural effusion. A right basilar pneumonia cannot be excluded. Minimal left basilar atelectasis. No evidence of pneumothorax. No acute bony abnormality identified. IMPRESSION: Interval placement of a tracheostomy tube with tip projecting at the level of the clavicular heads. New from the prior examination of 10/05/2019, there is right basilar opacity which may reflect atelectasis and/or pleural effusion. Right basilar aspiration/pneumonia is difficult to exclude. Persistent minimal left basilar atelectasis. Unchanged cardiomegaly. Electronically Signed   By: Kellie Simmering DO   On: 10/06/2019 14:57   DG Chest Port 1 View  Result Date: 10/05/2019 CLINICAL DATA:  S/P PICC central line placement EXAM: PORTABLE CHEST 1 VIEW COMPARISON:  Chest radiograph 10/05/2019 FINDINGS: Interval repositioning of a right upper extremity PICC with tip projecting at the cavoatrial junction. Otherwise stable support apparatus. No pneumothorax. Low lung volumes. Stable cardiomediastinal contours with enlarged heart size. Possible left basilar atelectasis. IMPRESSION: Interval repositioning of a right upper extremity PICC with tip projecting at the cavoatrial junction. Electronically Signed   By: Audie Pinto M.D.   On: 10/05/2019 16:44    Cardiac Studies   See section above (reviewed)  Patient Profile     34 y.o. female 2 weeks post-partum with VF/torsades arrest in the setting of anterior MI  Assessment & Plan    1. VF arrest (in-hospital): in setting of anterior infarct. Rhythm has  stabilized, off amiodarone, no significant arrhythmia on continuous telemetry. Continue beta-blockade.  - Now rewarmed and off sedation. - EEG 8/20 diffuse slowing but improved - Brain MRI normal. - Vent management per PCCM - VT quiescent. Keep K> 4.0 Mg > 2.0  2. Acute systolic heart failure: secondary to anterior MI, 09/24/19 LVEF 35%.  - Of note she will receive Peg later today .  - On carvedilol 6.25 bid, Entresto 97-103bid, dig 0.125 and spiro 25 mg daily.  -CO-OX 73%.  - Can add Bidil as needed 3. CAD/Anterior MI: angio reviewed again. Appears to have aneurysmal proximal LAD with dissection, likely culprit. Had TIMI-3 flow at cath but large infarct. Would anticipate relook catheterization once she recovers, likely medical therapy but will need to reassess.  -  Continue ASA,  beta-blocker and statin.  - No evidence of ischemia currently  4. Encephalopathy, post-arrest.Likely anoxic brain injury  - MRI 09/29/19 negative.  - plan as above -S/P Trach  5. Hypokalemia/hypomag - Replace  K and Mag  6. Hypertension -Stable. 7. Fevers - ? Neurogenic vs infectious - Low grade fever overnight, mTemp 100.5  - BC NGTD - Respiratory cultures growing rare enterobacter aerogenes.  - Completed antibiotics course 8. SVT on 10/03/19 - Resolved.   Peg later today.     Signed, Darrick Grinder, NP  10/07/2019, 7:42 AM     Agree with above. S/p trach yesterday. Remains hemodynamically stable off pressors/inotropes. No further VT/SVT  Neuro status unchanged. Responds only to deep pain.   General:  Eyes open but no tracking or following commands HEENT: normal with roving/bobbng eyes Neck: supple. no JVD. + trach Carotids 2+ bilat; no bruits. No lymphadenopathy or thryomegaly appreciated. Cor: PMI nondisplaced. Regular rate & rhythm. No rubs, gallops or murmurs. Lungs: clear Abdomen: obese soft, nontender, nondistended. No hepatosplenomegaly. No bruits or masses. Good bowel sounds. Extremities: no  cyanosis, clubbing, rash, edema Neuro: awake but not following commands  Currently stabilized from cardiac standpoint but neuro exam remains very concerning and has not made any progress in over 1 week. I d/w CCM. Consider repeating MRI and/or getting Neuro back involved for further prognostication.   CRITICAL CARE Performed by: Glori Bickers  Total critical care time: 35 minutes  Critical care time was exclusive of separately billable procedures and treating other patients.  Critical care was necessary to treat or prevent imminent or life-threatening deterioration.  Critical care was time spent personally by me (independent of midlevel providers or residents) on the following activities: development of treatment plan with patient and/or surrogate as well as nursing, discussions with consultants, evaluation of patient's response to treatment, examination of patient, obtaining history from patient or surrogate, ordering and performing treatments and interventions, ordering and review of laboratory studies, ordering and review of radiographic studies, pulse oximetry and re-evaluation of patient's condition.  Glori Bickers, MD  9:01 AM

## 2019-10-07 NOTE — Procedures (Signed)
Interventional Radiology Procedure Note  Procedure: Placement of percutaneous 20F pull-through gastrostomy tube. Complications: None Recommendations: - NPO except for sips and chips remainder of today and overnight - Maintain G-tube to LWS until tomorrow morning  - May advance diet as tolerated and begin using tube tomorrow morning  Signed,  Tyronza Happe K. Alfredo Spong, MD   

## 2019-10-07 NOTE — Progress Notes (Signed)
Pt transported on ventilator with RT and RN to MRI and back. Vital signs remained stable. Pt suctioned multiple times via trach and orally for frothy white/ frothy pink tinged secretions. RT will continue to monitor.

## 2019-10-07 NOTE — Progress Notes (Signed)
RT NOTE: RT transported patient on ventilator from room 2H16 to IR and back to room 2H16 with no complications. Vitals are stable. RT will continue to monitor.

## 2019-10-07 NOTE — Progress Notes (Signed)
EEG completed, results pending. 

## 2019-10-07 NOTE — Evaluation (Signed)
Occupational Therapy Evaluation Patient Details Name: Beverly [name] MRN: [id number] DOB: 07-11-1985 Today's Date: 10/07/2019    History of Present Illness 34 yo admitted 09/23/19 2 weeks post partum after emergent C section due to rupture of membranes with VF arrest. Pt with enterobacter PNA, trach 8/30, encephalopathy. PMHx: HTN   Clinical Impression   This 34 y/o female presents with the above. PTA pt independent with ADL and functional mobility, is the mother of 3 children (youngest is approx 16 wks old). Pt lethargic initially but does open and maintain eyes open with bed egress to chair position. Pt withdrawing to painful/noxious stimuli in bil UE/LEs (delayed in UEs) but with no other purposeful movement noted. Pt blinking to threat but also inconsistent/delayed (?possible tracking end of session but no consistent). She is currently totalA for ADL, tolerating bed egress to chair with VSS on trach/vent (40% FiO2, PEEP 5) and pt left upright in chair position end of session with RN made aware. [name] also present end of session, answering PLOF questions and very supportive. Pt to benefit from continued acute OT services to further progress her cognition and overall mobility/ADL status. Will follow.     Follow Up Recommendations  Other (comment) (to be further assessed )    Equipment Recommendations  Other (comment) (TBA)           Precautions / Restrictions Precautions Precautions: Fall Precaution Comments: flexi, bil prevalon, trach, vent  Restrictions Weight Bearing Restrictions: No      Mobility Bed Mobility Overal bed mobility: Needs Assistance Bed Mobility: Supine to Sit     Supine to sit: Total assist;+2 for physical assistance     General bed mobility comments: bed egress to chair position; totalA to pull trunk away from [facility]  Transfers                 General transfer comment: not appropriate to attempt     Balance                                            ADL either performed or assessed with clinical judgement   ADL Overall ADL's : Needs assistance/impaired                                       General ADL Comments: currently totalA     Vision   Vision Assessment?: Yes Additional Comments: pt blinks to threat (delayed and inconsistently), ?questionable tracking end of session but very limited. pt opening eyes/maintianing eye open once upright in chair position      Perception     Praxis      Pertinent Vitals/Pain Pain Assessment: Faces Faces Pain Scale: Hurts a little bit Pain Location: withraw from noxious/painful stimuli only Pain Descriptors / Indicators: Other (Comment) (withdraw) Pain Intervention(s): Monitored during session     Hand Dominance     Extremity/Trunk Assessment Upper Extremity Assessment Upper Extremity Assessment: Generalized weakness;Difficult to assess due to impaired cognition;RUE deficits/detail;LUE deficits/detail RUE Deficits / Details: withdraws to pain (after x3 attempts/3 different digits), no tone noted with PROM LUE Deficits / Details: withdraws to pain (after x3 attempts/3 different digits), no tone noted with PROM   Lower Extremity Assessment Lower Extremity Assessment: Defer to PT evaluation       Communication  Communication Communication: Tracheostomy   Cognition Arousal/Alertness: Lethargic;Awake/alert Behavior During Therapy: Flat affect Overall Cognitive Status: Impaired/Different from baseline Area of Impairment: Rancho level                               General Comments: pt opening eyes once upright in chair position, withdraws to noxious/painful stimuli in all extremities (more delayed in UEs). no other command follow noted, questionable tracking end of session    General Comments       Exercises     Shoulder Instructions      Home Living Family/patient expects to be discharged to:: Private residence Living  Arrangements: Parent;Children Available Help at Discharge: Family;Available 24 hours/day (mother/boyfriend ) Type of Home: House Home Access: Stairs to enter [employer] of Steps: 3   Home Layout: One level               Home Equipment: None   Additional Comments: [name] reports plans to discharge to mother's home       Prior Functioning/Environment Level of Independence: Independent        Comments: has 3 children, 40 y/o, 4 y/o and 4 weeks        OT Problem List: Decreased strength;Decreased range of motion;Decreased activity tolerance;Impaired balance (sitting and/or standing);Decreased cognition;Decreased safety awareness;Decreased knowledge of use of DME or AE;Decreased knowledge of precautions;Cardiopulmonary status limiting activity;Impaired UE functional use;Decreased coordination;Impaired vision/perception      OT Treatment/Interventions: Self-care/ADL training;Therapeutic exercise;Energy conservation;DME and/or AE instruction;Therapeutic activities;Patient/family education;Balance training;Cognitive remediation/compensation;Visual/perceptual remediation/compensation    OT Goals(Current goals can be found in the care plan section) Acute Rehab OT Goals Patient Stated Goal: none stated  OT Goal Formulation: Patient unable to participate in goal setting Time For Goal Achievement: 10/21/19 Potential to Achieve Goals: Fair  OT Frequency: Min 2X/week   Barriers to D/C:            Co-evaluation PT/OT/SLP Co-Evaluation/Treatment: Yes Reason for Co-Treatment: Complexity of the patient's impairments (multi-system involvement);Necessary to address cognition/behavior during functional activity   OT goals addressed during session: Strengthening/ROM      AM-PAC OT "6 Clicks" Daily Activity     Outcome Measure Help from another person eating meals?: Total Help from another person taking care of personal grooming?: Total Help from another person  toileting, which includes using toliet, bedpan, or urinal?: Total Help from another person bathing (including washing, rinsing, drying)?: Total Help from another person to put on and taking off regular upper body clothing?: Total Help from another person to put on and taking off regular lower body clothing?: Total 6 Click Score: 6   End of Session Equipment Utilized During Treatment: Oxygen Nurse Communication: Mobility status  Activity Tolerance: Patient tolerated treatment well Patient left: in bed;with call bell/phone within reach;with SCD's reapplied;with family/visitor present  OT Visit Diagnosis: Other abnormalities of gait and mobility (R26.89);Other symptoms and signs involving cognitive function;Muscle weakness (generalized) (M62.81)                Time: 1275-1700 OT Time Calculation (min): 27 min Charges:  OT General Charges $OT Visit: 1 Visit OT Evaluation $OT Eval High Complexity: 1 High  Marcy Siren, OT Acute Rehabilitation Services Pager [phone] Office [phone]  Orlando Penner 10/07/2019, 10:51 AM

## 2019-10-07 NOTE — Evaluation (Signed)
Physical Therapy Evaluation Patient Details Name: Beverly [name] MRN: [id number] DOB: [birth year] Today's Date: 10/07/2019   History of Present Illness  34 yo admitted 09/23/19 2 weeks post partum after emergent C section due to rupture of membranes with VF arrest. Pt with enterobacter PNA, trach 8/30, encephalopathy. PMHx: HTN  Clinical Impression  Pt with eyes closed on arrival and not initially able to be aroused with auditory cues. Transitioned pt to upright chair position of bed with pt opening eyes without visual focus initially or blink to threat. Pt with improved eye control and blink to threat with increased time in sitting and stimulation but without visual tracking. Pt with visual response to auditory stimulus at end of session only not during. Pt with response to noxious stimuli and presents as a Rancho II level. Pt with PROM all extremities performed and brief periods of head control . Pt with decreased cognition, balance, functional mobility who will benefit from acute therapy for neurologic stimulation, balance and functional activity to decrease burden of care. Mom present end of session and educated for all above and requested she bring pictures, music, items from home for stimulation.   PRVC 40%, peep 5 with SpO2 100%    Follow Up Recommendations LTACH    Equipment Recommendations  Other (comment) (defer to next venue)    Recommendations for Other Services OT consult     Precautions / Restrictions Precautions Precautions: Fall Precaution Comments: flexi, bil prevalon, trach, vent  Restrictions Weight Bearing Restrictions: No      Mobility  Bed Mobility Overal bed mobility: Needs Assistance Bed Mobility: Supine to Sit     Supine to sit: Total assist;+2 for physical assistance     General bed mobility comments: bed egress to chair position; totalA to pull trunk away from [facility], no trunk activation with LOB or perturbation  Transfers                  General transfer comment: not appropriate to attempt   Ambulation/Gait                Stairs            Wheelchair Mobility    Modified Rankin (Stroke Patients Only)       Balance Overall balance assessment: Needs assistance   Sitting balance-Leahy Scale: Zero                                       Pertinent Vitals/Pain Pain Assessment: Faces Faces Pain Scale: Hurts a little bit Pain Location: withdraw from noxious/painful stimuli only Pain Descriptors / Indicators: Other (Comment) (withdraw) Pain Intervention(s): Monitored during session;Repositioned    Home Living Family/patient expects to be discharged to:: Private residence Living Arrangements: Parent;Children Available Help at Discharge: Family;Available 24 hours/day Type of Home: House Home Access: Stairs to enter   [employer] of Steps: 3 Home Layout: One level Home Equipment: None Additional Comments: [name] reports plans to discharge to mother's home. pt normally lives in an apartment with elevator with her kids and boyfriend    Prior Function Level of Independence: Independent         Comments: has 3 children, 75 y/o, 4 y/o and 4 weeks     Hand Dominance        Extremity/Trunk Assessment   Upper Extremity Assessment Upper Extremity Assessment: Defer to OT evaluation RUE Deficits / Details: withdraws to  pain (after x3 attempts/3 different digits), no tone noted with PROM LUE Deficits / Details: withdraws to pain (after x3 attempts/3 different digits), no tone noted with PROM    Lower Extremity Assessment Lower Extremity Assessment: RLE deficits/detail;LLE deficits/detail RLE Deficits / Details: withdrawal to pain with nailbed pressure, PROM WFL no other AROM LLE Deficits / Details: withdrawal to pain with nailbed pressure, PROM WFL no other AROM    Cervical / Trunk Assessment Cervical / Trunk Assessment:  (lack of control of neck with pt able to be  postioned in neutral and maintain head control grossly 3 min but unable to repeat)  Communication   Communication: Tracheostomy  Cognition Arousal/Alertness: Lethargic Behavior During Therapy: Flat affect Overall Cognitive Status: Impaired/Different from baseline Area of Impairment: Rancho level               Rancho Levels of Cognitive Functioning Rancho Los Amigos Scales of Cognitive Functioning: Generalized response               General Comments: pt opening eyes once upright in chair position, withdraws to noxious/painful stimuli in all extremities (more delayed in UEs). no other command follow noted, questionable visual response to auditory stimuli end of session. Pt with automatic yawn and did move RUE x 1 during mouth suctioning      General Comments      Exercises General Exercises - Lower Extremity Ankle Circles/Pumps: PROM;Both;Seated;10 reps Hip ABduction/ADduction: PROM;Both;Seated;10 reps Hip Flexion/Marching: PROM;Both;10 reps;Seated   Assessment/Plan    PT Assessment Patient needs continued PT services  PT Problem List Decreased strength;Decreased mobility;Decreased coordination;Decreased activity tolerance;Decreased cognition;Decreased balance;Decreased knowledge of use of DME;Cardiopulmonary status limiting activity;Decreased safety awareness;Decreased range of motion       PT Treatment Interventions Functional mobility training;Therapeutic activities;Patient/family education;Cognitive remediation;Neuromuscular re-education;Balance training;Therapeutic exercise    PT Goals (Current goals can be found in the Care Plan section)  Acute Rehab PT Goals Patient Stated Goal: return home, care for her kids PT Goal Formulation: With family Time For Goal Achievement: 10/21/19 Potential to Achieve Goals: Fair    Frequency Min 2X/week   Barriers to discharge        Co-evaluation PT/OT/SLP Co-Evaluation/Treatment: Yes Reason for Co-Treatment: Complexity  of the patient's impairments (multi-system involvement);For patient/therapist safety PT goals addressed during session: Mobility/safety with mobility;Strengthening/ROM OT goals addressed during session: Strengthening/ROM       AM-PAC PT "6 Clicks" Mobility  Outcome Measure Help needed turning from your back to your side while in a flat bed without using bedrails?: Total Help needed moving from lying on your back to sitting on the side of a flat bed without using bedrails?: Total Help needed moving to and from a bed to a chair (including a wheelchair)?: Total Help needed standing up from a chair using your arms (e.g., wheelchair or bedside chair)?: Total Help needed to walk in hospital room?: Total Help needed climbing 3-5 steps with a railing? : Total 6 Click Score: 6    End of Session   Activity Tolerance: Patient tolerated treatment well Patient left: in bed;with bed alarm set Nurse Communication: Mobility status;Need for lift equipment PT Visit Diagnosis: Other abnormalities of gait and mobility (R26.89);Difficulty in walking, not elsewhere classified (R26.2);Other symptoms and signs involving the nervous system (R29.898)    Time: 5427-0623 PT Time Calculation (min) (ACUTE ONLY): 28 min   Charges:   PT Evaluation $PT Eval High Complexity: 1 High          Murat Rideout P, PT Acute Rehabilitation  Services Pager: [phone] Office: [phone]   Enedina Finner Katryna Tschirhart 10/07/2019, 12:13 PM

## 2019-10-07 NOTE — Progress Notes (Signed)
SLP Cancellation Note  Patient Details Name: Beverly [name] MRN: [id number] DOB: 07/05/1985   Cancelled treatment:       Reason Eval/Treat Not Completed: Medical issues which prohibited therapy. Pt with new trach, continues on vent today. Responsive to noxious stimuli only. Pt will go for PEG later today.  Will continue efforts to proceed with PMSV evaluation and BSE when appropriate.   Mieke Brinley B. Murvin Natal, [facility], CCC-SLP Speech Language Pathologist Office: [phone]  [name] 10/07/2019, 9:08 AM

## 2019-10-07 NOTE — Progress Notes (Addendum)
NAME:  Beverly [name], MRN:  [id number], DOB:  24-Feb-1985, LOS: 13 ADMISSION DATE:  09/24/2019, CONSULTATION DATE:  8/18 REFERRING MD:  Dr. Eldridge Dace, CHIEF COMPLAINT:  VF arrest   Brief History   34 year old female 2 weeks post partum presented with chest pain and suffered VF arrest.    History of present illness   Patient is encephalopathic and/or intubated. Therefore history has been obtained from chart review.  34 year old female with PMH as below, which is significant for HTN and 2 weeks post-partum. She had emergency C-section delivery due to premature rupture of membranes. She did have some gestational hypertension, but her pregnancy was otherwise uncomplicated. On 8/17 she developed dull chest pain shortly after eating, which prompted ED presentation at [facility]. Resolved spontaneously. No associated symptoms.   Past Medical History   has a past medical history of Hypertension and Vaginal Pap smear, abnormal.  Significant Hospital Events   8/17 chest pain, VF arrest; While in ED, she suffered VT > VF arrest with ROSC after defib x 3 and epi x 1. Immediately after arrest ST elevation was noted on EKG, which did improve on repeat. Differential ACS, SCAD, and toresades/metabolic. Electrolytes were replaced. She was taken to the cath lab at [facility]  Where aneurysmal dilation of the ostial and proximal LAD and was transferred to [facility] for ICU admission.  8/23 possibly febrile. Pan-cultures sent. Still on full support. Gets hypertensive when sedation let up. abx started later in day when temp >101 8/24 opens eyes to stimulus. + strong cough. Tolerating PSV but mental status still not supporting extubation  8/25 about the same  8/27: Seems to be moving a little bit more but mostly posturing.  Opening eyes more.  Had a run of SVT. Consults:    Procedures:  8/18> Central Line, removed 8/26 PICC line placed  Significant Diagnostic Tests:  LHC 8/17 > No coronary artery occlusion  or critical stenosis. There is aneurysmal dilation of the ostial and proximal LAD with possible ulceration or dissection of uncertain chronicity.  TIMI-3 flow is noted throughout the LAD and its branches. Severely reduced left ventricular systolic function with mid/apical anterior, apical, and apical inferior hypokinesis/akinesis. LV gram 30% EF.  CT head 8/17 > normal  CTA chest 8/17 > no evidence of PE, moderate severity infiltrates vs ATX, small bilateral pleural effusions. Very small pericaradial effusion.  CT abdomen/pelvis 8/17 > Enlarged, heterogeneous uterus, Small amount of fluid seen in between the right kidney and right lobe of the liver. Echo 8/18> LVEF 30-35%, septal and apical akinesis distal anterior wall an d mid/distal inferior wall hypokinesis hyperdynamic basal function. LV size mildly dilated.  MRI brain 8/23: Normal  Micro Data:  Blood 8/18 > Respiratory 8/23: Few GPC, rare G VR rare Enterobacter UC 8/23>>> negative Blood 8/23>> Antimicrobials:  Ceftriazone 8/18 >completed 5 days Azithromycin 8/18 >8/18 Cefepime 8/23 >>  Vancomycin 8/23-->8/25 Interim history/subjective:  No events. Exam stable, opens eyes to painful stimuli and withdraws RUE.  Objective   Blood pressure 133/83, pulse 89, temperature 99.6 F (37.6 C), temperature source Oral, resp. rate 15, height 5\' 10"  (1.778 m), weight 99 kg, SpO2 100 %, unknown if currently breastfeeding. CVP:  [3 mmHg-5 mmHg] 3 mmHg  Vent Mode: PRVC FiO2 (%):  [30 %-100 %] 40 % Set Rate:  [15 bmp] 15 bmp Vt Set:  [520 mL] 520 mL PEEP:  [5 cmH20] 5 cmH20 Pressure Support:  [8 cmH20] 8 cmH20 Plateau  Pressure:  [16 cmH20-17 cmH20] 16 cmH20   Intake/Output Summary (Last 24 hours) at 10/07/2019 0918 Last data filed at 10/07/2019 0804 Gross per 24 hour  Intake 668.21 ml  Output 1810 ml  Net -1141.79 ml   Filed Weights   10/04/19 0500 10/05/19 0327 10/06/19 0327  Weight: 98.6 kg 99.5 kg 99 kg    Examination: GEN: no  acute distress HEENT: ETT in place, small/moderate secretions CV: RRR, ext warm PULM: Clear, no wheezing GI: Soft, +BS EXT: No edema NEURO: moves RUE occasionally, opens eyes to pain today,  PSYCH: cannot assess SKIN: no rashes  BMP benign SvO2 73% Afebrile 8/23 resp culture enterobacter, end date for therapy is 8/29  Resolved Hospital Problem list   Hypokalemia  Assessment & Plan:   VF Arrest New Onset Post Partum Cardiomyopathy, EF 35% HFrEF New SVT on 8/27 Plan -Appreciate cardiology management.  Continue carvedilol, Entresto, spironolactone, BiDil, digoxin, aspirin -Follow BMP, replace electrolytes as indicated  Acute metabolic encephalopathy post cardiac arrest -Currently MRI negative EEG negative for seizures; now off sedation.  Plan is to continue maximal level of care in hopes for recovery. Plan -Keppra to PO, end date 7 days -Avoid fevers -Minimizing sedation, after discussion with Dr. Gala Romney 8/31 who has watched her progression, he is concerned regarding current plateau in mental status, will get 30 min EEG, ammonia level, and repeat MRI and potentially re-engage neurology based on results -PEG today  Acute Hypoxemic Respiratory Failure 2/2 to Cardiac Arrest, Pulmonary Edema further complicated by Enterobacter pneumonia (S) to cefepime  Tolerating pressure support ventilation well however mental status precludes extubation.  Cefepime end date 8/29 for 7 days therapy. Plan - Vent wean as able  Best practice:  Diet: TF Pain/Anxiety/Delirium protocol (if indicated): not needed VAP protocol (if indicated): yes DVT prophylaxis: lovenox GI prophylaxis: PPI Glucose control: SSI Mobility: BR Code Status: FULL  Family Communication: updated at bedside 8/30 Disposition: ICU    The patient is critically ill with multiple organ systems failure and requires high complexity decision making for assessment and support, frequent evaluation and titration of  therapies, application of advanced monitoring technologies and extensive interpretation of multiple databases. Critical Care Time devoted to patient care services described in this note independent of APP/resident time (if applicable)  is 34 minutes.   Myrla Halsted MD [facility] 10/07/2019 9:18 AM Personal pager: [phone] If unanswered, please page CCM On-call: #[phone]

## 2019-10-07 NOTE — Procedures (Signed)
Patient Name:Beverly [name] TDH:[id number] Epilepsy Attending:Yishai Rehfeld [name] Referring Physician/Provider:Dr. Levon Hedger Date: 10/07/2019 Duration:23.35 mins  Patient history:34 year old female status post cardiac arrest and seizure-like activity. EEG to assess for seizures.  Level of alertness:Comatose  AEDs during EEG study:Keppra  Technical aspects: This EEG study was done with scalp electrodes positioned according to the 10-20 International system of electrode placement. Electrical activity was acquired at a sampling rate of 500Hz  and reviewed with a high frequency filter of 70Hz  and a low frequency filter of 1Hz . EEG data were recorded continuously and digitally stored.   Description:EEG showed continuous generalized 3-6Hz  theta-delta slowing as well as 2-3Hz  delta activity. EEG was reactive to tactile stimulation. Hyperventilation and photic stimulation were not performed.  ABNORMALITY -Continuous slow, generalized  IMPRESSION: This study issuggestive of severe diffuse encephalopathy, nonspecific to etiology.No seizures ordefinite epileptiform discharges were seen throughout the recording.   Caterin Tabares 

## 2019-10-07 NOTE — Sedation Documentation (Signed)
Per Dr Archer Asa, no sedation medication to start.  Monitor patient,  Verbal orders read back and verified.

## 2019-10-08 LAB — URINALYSIS, ROUTINE W REFLEX MICROSCOPIC
Bilirubin Urine: NEGATIVE
Glucose, UA: NEGATIVE mg/dL
Ketones, ur: NEGATIVE mg/dL
Nitrite: NEGATIVE
Protein, ur: 30 mg/dL — AB
RBC / HPF: >50 RBC/hpf — ABNORMAL HIGH (ref 0–5)
Specific Gravity, Urine: 1.031 — ABNORMAL HIGH (ref 1.005–1.030)
pH: 5 (ref 5.0–8.0)

## 2019-10-08 LAB — GLUCOSE, CAPILLARY
Glucose-Capillary: 119 mg/dL — ABNORMAL HIGH (ref 70–99)
Glucose-Capillary: 125 mg/dL — ABNORMAL HIGH (ref 70–99)
Glucose-Capillary: 130 mg/dL — ABNORMAL HIGH (ref 70–99)
Glucose-Capillary: 86 mg/dL (ref 70–99)
Glucose-Capillary: 96 mg/dL (ref 70–99)
Glucose-Capillary: 98 mg/dL (ref 70–99)

## 2019-10-08 LAB — PHOSPHORUS: Phosphorus: 4.7 mg/dL — ABNORMAL HIGH (ref 2.5–4.6)

## 2019-10-08 LAB — CBC
HCT: 30.9 % — ABNORMAL LOW (ref 36.0–46.0)
Hemoglobin: 9.3 g/dL — ABNORMAL LOW (ref 12.0–15.0)
MCH: 25.5 pg — ABNORMAL LOW (ref 26.0–34.0)
MCHC: 30.1 g/dL (ref 30.0–36.0)
MCV: 84.9 fL (ref 80.0–100.0)
Platelets: 654 10*3/uL — ABNORMAL HIGH (ref 150–400)
RBC: 3.64 MIL/uL — ABNORMAL LOW (ref 3.87–5.11)
RDW: 18 % — ABNORMAL HIGH (ref 11.5–15.5)
WBC: 14.6 10*3/uL — ABNORMAL HIGH (ref 4.0–10.5)
nRBC: 0 % (ref 0.0–0.2)

## 2019-10-08 LAB — BASIC METABOLIC PANEL
Anion gap: 12 (ref 5–15)
BUN: 13 mg/dL (ref 6–20)
CO2: 25 mmol/L (ref 22–32)
Calcium: 9.6 mg/dL (ref 8.9–10.3)
Chloride: 100 mmol/L (ref 98–111)
Creatinine, Ser: 0.52 mg/dL (ref 0.44–1.00)
GFR calc Af Amer: >60 mL/min (ref >60)
GFR calc non Af Amer: >60 mL/min (ref >60)
Glucose, Bld: 97 mg/dL (ref 70–99)
Potassium: 3.8 mmol/L (ref 3.5–5.1)
Sodium: 137 mmol/L (ref 135–145)

## 2019-10-08 LAB — MAGNESIUM: Magnesium: 1.8 mg/dL (ref 1.7–2.4)

## 2019-10-08 LAB — PROCALCITONIN: Procalcitonin: 0.75 ng/mL

## 2019-10-08 MED ORDER — MAGNESIUM SULFATE 2 GM/50ML IV SOLN
2.0000 g | Freq: Once | INTRAVENOUS | Status: AC
  Administered 2019-10-08: 2 g via INTRAVENOUS
  Filled 2019-10-08: qty 50

## 2019-10-08 MED ORDER — PROSOURCE TF PO LIQD
90.0000 mL | Freq: Three times a day (TID) | ORAL | Status: DC
  Administered 2019-10-08 – 2019-10-14 (×18): 90 mL
  Filled 2019-10-08 (×21): qty 90

## 2019-10-08 MED ORDER — VITAL 1.5 CAL PO LIQD
1000.0000 mL | ORAL | Status: DC
  Administered 2019-10-08 – 2019-10-13 (×7): 1000 mL
  Filled 2019-10-08 (×8): qty 1000

## 2019-10-08 MED ORDER — MODAFINIL 100 MG PO TABS
100.0000 mg | ORAL_TABLET | Freq: Every day | ORAL | Status: AC
  Administered 2019-10-08 – 2019-10-10 (×3): 100 mg
  Filled 2019-10-08 (×3): qty 1

## 2019-10-08 MED ORDER — SCOPOLAMINE 1 MG/3DAYS TD PT72
1.0000 | MEDICATED_PATCH | TRANSDERMAL | Status: DC
  Administered 2019-10-08 – 2019-11-01 (×8): 1.5 mg via TRANSDERMAL
  Filled 2019-10-08 (×9): qty 1

## 2019-10-08 MED ORDER — POTASSIUM CHLORIDE 20 MEQ PO PACK
40.0000 meq | PACK | Freq: Once | ORAL | Status: AC
  Administered 2019-10-08: 40 meq
  Filled 2019-10-08: qty 2

## 2019-10-08 NOTE — Progress Notes (Signed)
SLP Cancellation Note  Patient Details Name: Beverly [name] MRN: [id number] DOB: 1986/01/02   Cancelled treatment:       Reason Eval/Treat Not Completed: Medical issues which prohibited therapy (Pt still currently on vent. SLP will follow up. )  [name] I. Vear Clock, MS, CCC-SLP Acute Rehabilitation Services Office number [phone] Pager [phone]  [name] 10/08/2019, 11:55 AM

## 2019-10-08 NOTE — Consult Note (Addendum)
Neurology Consultation Reason for Consult: Altered mental status, neuro prognostication Referring Physician: Dr. Arvilla Meres  CC: Cardiac arrest  History is obtained from: Chart review as patient encephalopathic  HPI: Beverly [name] is a 34 y.o. female who initially presented to the emergency room on 8/70/2021 for chest pain.  While in the ED patient had VT/VF cardiac arrest requiring defibrillation x3 and epinephrine x1.  Postarrest, EKG showed sinus tachycardia versus atrial flutter with widespread ST elevation and therefore patient was taken to Cath Lab.  Coronary angiography showed reduced LV function with apical hypokinesis and aneurysmal proximal LAD with dissection.  Patient underwent TTM to keep normothermic at 37.5 degrees.  Patient has been off sedation but continues to be encephalopathic.  Therefore neurology was consulted for further recommendations.  Of note, patient was 2 weeks postpartum at the time of admission.  ROS: Unable to obtain due to altered mental status.   Past Medical History:  Diagnosis Date  . Hypertension    last pregnancy  . Vaginal Pap smear, abnormal    when she was 34yo    Family History  Problem Relation Age of Onset  . Hyperlipidemia Mother   . Hypertension Mother   . Diabetes Maternal Grandmother   . Seizures Maternal Grandmother   . Thyroid disease Maternal Grandmother   . Diabetes Paternal Grandmother   . Rashes / Skin problems Son        ezcema  . Seizures Son    Social History: Per chart review, reports that she has quit smoking. She has never used smokeless tobacco. She reports current alcohol use. She reports that she does not use drugs.  Exam: Current vital signs: BP 126/83   Pulse (!) 107   Temp 100.1 F (37.8 C)   Resp (!) 24   Ht 5\' 10"  (1.778 m)   Wt 95.6 kg   SpO2 99%   BMI 30.24 kg/m  Vital signs in last 24 hours: Temp:  [98.7 F (37.1 C)-100.4 F (38 C)] 100.1 F (37.8 C) (09/01 1113) Pulse Rate:  [79-114]  107 (09/01 0900) Resp:  [13-29] 24 (09/01 0900) BP: (112-154)/(64-99) 126/83 (09/01 0806) SpO2:  [92 %-100 %] 99 % (09/01 0900) FiO2 (%):  [40 %-100 %] 40 % (09/01 0806) Weight:  [95.6 kg] 95.6 kg (09/01 0200)   Physical Exam  Constitutional: Appears well-developed and well-nourished.  Psych: Unable to assess secondary to altered mental status Eyes: No scleral injection HENT: No OP obstrucion Head: Normocephalic.  Cardiovascular: Normal rate and regular rhythm.  Respiratory: +trach, non-labored breathing GI: Soft.  No distension  Skin: Warm, no apparent ulcers Neuro: Opens eyes spontaneously, does not track examiner, does not blink to threat, pupils equally round and reactive, no apparent facial asymmetry, moving all 4 extremities with antigravity strength to noxious stimuli.  I have reviewed labs in epic and the results pertinent to this consultation are: WBC 14.6, hemoglobin 9.3, platelets 654, phosphorus 4.7, normal ammonia,  I have reviewed the images obtained: MRI brain without contrast 10/07/2019: No evidence of anoxic brain injury.  Normal MRI brain  MRI brain without contrast 09/29/2019: Normal MRI brain.  ASSESSMENT/PLAN: 34 year old female status post cardiac arrest on 8/70/81, continues to be encephalopathic.  Cardiac arrest Suspected anoxic/hypoxic brain injury Leukocytosis Anemia Thrombocytosis Hyperphosphatemia -LTM EEG continues to be suggestive of encephalopathy. -MRI brain did not show any evidence of anoxic/hypoxic brain injury.    Recommendations: -Even though MRI brain is not showing any anoxic/hypoxic brain injury, there is high  likelihood that patient sustained at least some degree of anoxic/hypoxic brain injury. -Currently, patient opens eyes spontaneously, is moving all 4 extremities but still not following commands. - In- hospital cardiac arrest, opening eyes on exam, no evidence of significant anoxic/hypoxic brain injury on MRI and a continuous  reactive EEG are suggestive of continuing brain recovery.  However it is difficult to prognosticate the extent of improvement.  - Patient's mother is at bedside.  Discussed that patient most likely sustained some amount of hypoxic/anoxic brain injury.  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 -Agree with critical care team starting Provigil 100mg  dailyfor neuro stimulation.  -Keppra started due to concern for seizures.  However no definite seizures or epileptiform abnormalities were seen on EEG.  Can consider discontinuing Keppra if patient remains seizure-free. -Continue PT/OT, neuro protective measures  I have spent a total of 80 minutes with the patient reviewing hospital notes,  test results, labs and examining the patient as well as establishing an assessment and plan that was discussed personally with the patient's mother and Dr .  > 50% of time was spent in direct patient care.    Katrinka Blazing Epilepsy [facility] neuro[facility]ist

## 2019-10-08 NOTE — Progress Notes (Signed)
ADVANCED HF PROGRESS  Patient Name: Beverly [name] Date of Encounter: 10/08/2019  [facility] Cardiologist: No primary care provider on file.   Subjective   Now s/p Trach & PEG  Had repeat brain MRI and EEG. Brain MRI ok. EEG with suggestive of severe diffuse encephalopathy. No seizures.   Inpatient Medications    Scheduled Meds: . aspirin  81 mg Per Tube Daily  . atorvastatin  80 mg Per Tube Daily  . carvedilol  6.25 mg Per Tube BID WC  . chlorhexidine gluconate (MEDLINE KIT)  15 mL Mouth Rinse BID  . Chlorhexidine Gluconate Cloth  6 each Topical Daily  . digoxin  0.125 mg Per Tube Daily  . docusate  100 mg Per Tube BID  . enoxaparin (LOVENOX) injection  40 mg Subcutaneous Q24H  . feeding supplement (PROSource TF)  45 mL Per Tube QID  . free water  200 mL Per Tube Q6H  . insulin aspart  0-15 Units Subcutaneous Q4H  . levETIRAcetam  500 mg Per Tube BID  . mouth rinse  15 mL Mouth Rinse 10 times per day  . pantoprazole (PROTONIX) IV  40 mg Intravenous Q24H  . polyethylene glycol  17 g Per Tube Daily  . prenatal multivitamin  1 tablet Per Tube Q1200  . sacubitril-valsartan  1 tablet Per Tube BID  . sodium chloride flush  10-40 mL Intracatheter Q12H  . spironolactone  25 mg Per Tube Daily   Continuous Infusions: . sodium chloride Stopped (10/07/19 1511)  . feeding supplement (VITAL AF 1.2 CAL) Stopped (10/06/19 1300)   PRN Meds: acetaminophen (TYLENOL) oral liquid 160 mg/5 mL, hydrALAZINE, midazolam, sodium chloride flush   Vital Signs    Vitals:   10/08/19 0600 10/08/19 0700 10/08/19 0735 10/08/19 0806  BP: 123/83 124/75  126/83  Pulse: (!) 107 (!) 107  (!) 111  Resp: 18 17  18   Temp:   (!) 100.4 F (38 C)   TempSrc:      SpO2: 98% 96%  98%  Weight:      Height:        Intake/Output Summary (Last 24 hours) at 10/08/2019 0811 Last data filed at 10/08/2019 0529 Gross per 24 hour  Intake 834.15 ml  Output 1195 ml  Net -360.85 ml   Last 3 Weights 10/08/2019  10/06/2019 10/05/2019  Weight (lbs) 210 lb 12.2 oz 218 lb 4.1 oz 219 lb 5.7 oz  Weight (kg) 95.6 kg 99 kg 99.5 kg  Some encounter information is confidential and restricted. Go to Review Flowsheets activity to see all data.      Telemetry   Sinus tach 100-110 Personally reviewed  Physical Exam   General:  On vent. Will open eyes but not follow commands HEENT: normal + trach Neck: supple. no JVD. Carotids 2+ bilat; no bruits. No lymphadenopathy or thryomegaly appreciated. Cor: PMI nondisplaced. Regular tachy. No rubs, gallops or murmurs. Lungs: clear Abdomen: obese soft, nontender, nondistended. No hepatosplenomegaly. No bruits or masses. Good bowel sounds. + PEG Extremities: no cyanosis, clubbing, rash, edema Neuro: Will open eyes but not follow commands. + gag. Responds to deep pain    Labs    High Sensitivity Troponin:   Recent Labs  Lab 09/23/19 1539 09/23/19 1816 09/24/19 0039 09/24/19 0826  TROPONINIHS 127* 2,078* 24,421* >27,000*      Chemistry Recent Labs  Lab 10/06/19 0355 10/07/19 0353 10/08/19 0335  NA 140 140 137  K 4.0 3.8 3.8  CL 102 103 100  CO2 28 25 25   GLUCOSE 108* 86 97  BUN 13 13 13   CREATININE 0.48 0.45 0.52  CALCIUM 9.4 9.2 9.6  GFRNONAA >60 >60 >60  GFRAA >60 >60 >60  ANIONGAP 10 12 12      Hematology Recent Labs  Lab 10/05/19 0403 10/06/19 0355 10/08/19 0335  WBC 11.4* 11.1* 14.6*  RBC 3.74* 3.64* 3.64*  HGB 9.7* 9.4* 9.3*  HCT 32.3* 31.4* 30.9*  MCV 86.4 86.3 84.9  MCH 25.9* 25.8* 25.5*  MCHC 30.0 29.9* 30.1  RDW 17.6* 17.6* 18.0*  PLT 627* 639* 654*    BNP No results for input(s): BNP, PROBNP in the last 168 hours.   DDimer No results for input(s): DDIMER in the last 168 hours.   Radiology    EEG  Result Date: 10/07/2019 [name], MD     10/07/2019  4:11 PM Patient Name:Beverly [name] OEV:[id number] Epilepsy Attending:Priyanka Barbra Sarks Referring Physician/Provider:Dr. Ina Homes Date: 10/07/2019  Duration:23.35 mins  Patient history:34 year old female status post cardiac arrest and seizure-like activity. EEG to assess for seizures.  Level of alertness:Comatose  AEDs during EEG study:Keppra  Technical aspects: This EEG study was done with scalp electrodes positioned according to the 10-20 International system of electrode placement. Electrical activity was acquired at a sampling rate of 500Hz  and reviewed with a high frequency filter of 70Hz  and a low frequency filter of 1Hz . EEG data were recorded continuously and digitally stored.  Description:EEG showed continuous generalized 3-6Hz  theta-delta slowing as well as 2-3Hz  delta activity. EEG was reactive to tactile stimulation. Hyperventilation and photic stimulation were not performed. ABNORMALITY -Continuous slow, generalized  IMPRESSION: This study issuggestive of severe diffuse encephalopathy, nonspecific to etiology.No seizures ordefinite epileptiform discharges were seen throughout the recording.  [name]  MR BRAIN WO CONTRAST  Result Date: 10/07/2019 CLINICAL DATA:  Anoxic brain injury. EXAM: MRI HEAD WITHOUT CONTRAST TECHNIQUE: Multiplanar, multiecho pulse sequences of the brain and surrounding structures were obtained without intravenous contrast. COMPARISON:  09/29/2019 MRI head.  09/23/2019 head CT. FINDINGS: Some image sequences are degraded by motion artifact. Brain: No focal parenchymal signal abnormality. No diffusion-weighted signal abnormality. No acute infarct or intracranial hemorrhage. No midline shift, ventriculomegaly or extra-axial fluid collection. No mass lesion. Vascular: Normal flow voids. Skull and upper cervical spine: Normal marrow signal. Sinuses/Orbits: Normal orbits. Clear paranasal sinuses. No mastoid effusion. Other: None. IMPRESSION: No evidence of anoxic brain injury. Normal MRI of the brain. Electronically Signed   By: Primitivo Gauze M.D.   On: 10/07/2019 14:17   IR GASTROSTOMY TUBE  MOD SED  Result Date: 10/07/2019 INDICATION: 34 year old female with persistently altered mental status and dysphagia. She requires percutaneous gastrostomy tube for long-term nutrition. EXAM: Fluoroscopically guided placement of percutaneous pull-through gastrostomy tube Interventional Radiologist:  Criselda Peaches, MD MEDICATIONS: 2 g Ancef; Antibiotics were administered within 1 hour of the procedure. ANESTHESIA/SEDATION: None CONTRAST:  65m OMNIPAQUE IOHEXOL 300 MG/ML  SOLN FLUOROSCOPY TIME:  Fluoroscopy Time: 0 minutes 36 seconds (4 mGy). COMPLICATIONS: None immediate. PROCEDURE: Informed written consent was obtained from the patient after a thorough discussion of the procedural risks, benefits and alternatives. All questions were addressed. Maximal Sterile Barrier Technique was utilized including caps, mask, sterile gowns, sterile gloves, sterile drape, hand hygiene and skin antiseptic. A timeout was performed prior to the initiation of the procedure. Maximal barrier sterile technique utilized including caps, mask, sterile gowns, sterile gloves, large sterile drape, hand hygiene, and chlorhexadine skin prep. An angled catheter was advanced over  a wire under fluoroscopic guidance through the nose, down the esophagus and into the body of the stomach. The stomach was then insufflated with several 100 ml of air. Fluoroscopy confirmed location of the gastric bubble, as well as inferior displacement of the barium stained colon. Under direct fluoroscopic guidance, a single T-tack was placed, and the anterior gastric wall drawn up against the anterior abdominal wall. Percutaneous access was then obtained into the mid gastric body with an 18 gauge sheath needle. Aspiration of air, and injection of contrast material under fluoroscopy confirmed needle placement. An Amplatz wire was advanced in the gastric body and the access needle exchanged for a 9-French vascular sheath. A snare device was advanced through the  vascular sheath and an Amplatz wire advanced through the angled catheter. The Amplatz wire was successfully snared and this was pulled up through the esophagus and out the mouth. A 20-French Alinda Dooms MIC-PEG tube was then connected to the snare and pulled through the mouth, down the esophagus, into the stomach and out to the anterior abdominal wall. Hand injection of contrast material confirmed intragastric location. The T-tack retention suture was then cut. The pull through peg tube was then secured with the external bumper and capped. The patient will be observed for several hours with the newly placed tube on low wall suction to evaluate for any post procedure complication. The patient tolerated the procedure well, there is no immediate complication. IMPRESSION: Successful placement of a 20 French pull through gastrostomy tube. Electronically Signed   By: Jacqulynn Cadet M.D.   On: 10/07/2019 16:45   DG Chest Port 1 View  Result Date: 10/06/2019 CLINICAL DATA:  Status post tracheostomy. EXAM: PORTABLE CHEST 1 VIEW COMPARISON:  Prior chest radiographs 10/05/2019 and earlier. FINDINGS: Interval placement of a tracheostomy tube. The tracheostomy tube tip projects at the level of the clavicular heads. Unchanged position of a right-sided PICC. Unchanged cardiomegaly. New from the prior examination there is opacity at the right lung base which may reflect atelectasis and/or pleural effusion. A right basilar pneumonia cannot be excluded. Minimal left basilar atelectasis. No evidence of pneumothorax. No acute bony abnormality identified. IMPRESSION: Interval placement of a tracheostomy tube with tip projecting at the level of the clavicular heads. New from the prior examination of 10/05/2019, there is right basilar opacity which may reflect atelectasis and/or pleural effusion. Right basilar aspiration/pneumonia is difficult to exclude. Persistent minimal left basilar atelectasis. Unchanged cardiomegaly.  Electronically Signed   By: Kellie Simmering DO   On: 10/06/2019 14:57    Cardiac Studies   See section above (reviewed)  Patient Profile     34 y.o. female 2 weeks post-partum with VF/torsades arrest in the setting of anterior MI  Assessment & Plan    1. VF arrest (in-hospital): in setting of anterior infarct. Rhythm has stabilized, off amiodarone, no significant arrhythmia on continuous telemetry. Continue beta-blockade.  - Now rewarmed and off sedation. But remains essentially unresponsive - EEG 8/20 diffuse slowing but improved - Brain MRI normal x 2. EEG still with diffuse slowing - Vent management per PCCM - VT quiescent. Keep K> 4.0 Mg > 2.0  2. Acute systolic heart failure: secondary to anterior MI, 09/24/19 LVEF 35%.  - On carvedilol 6.25 bid, Entresto 97-103bid, dig 0.125 and spiro 25 mg daily.  - CO-OX 73% yesterday. Weight down.  - More tachycardic today. -> concern for developing sepsis  3. CAD/Anterior MI: angio reviewed again. Appears to have aneurysmal proximal LAD with dissection, likely culprit.  Had TIMI-3 flow at cath but large infarct. Would anticipate relook catheterization once she recovers, likely medical therapy but will need to reassess.  - Continue ASA,  beta-blocker and statin.  - No evidence of ischemia currently 4. Encephalopathy, post-arrest.Likely anoxic brain injury  - remains essentially unresponsive - MRI 09/29/19 & 10/07/19 negative.  - EEG 8/31 EEG still with diffuse slowing 5. Hypokalemia/hypomag - Replace  K and Mag  6. Hypertension -Stable. 7. Fevers - ? Neurogenic vs infectious - Continues with low grade fever overnight, mTemp 100.4 - BC NGTD - Respiratory cultures growing rare enterobacter aerogenes.  - Completed antibiotics course - WBC on rise again. More tachy. Concern for developing sepsis.  - Repeat cultures  8. SVT on 10/03/19 - Resolved.   CRITICAL CARE Performed by: Glori Bickers  Total critical care time: 35  minutes  Critical care time was exclusive of separately billable procedures and treating other patients.  Critical care was necessary to treat or prevent imminent or life-threatening deterioration.  Critical care was time spent personally by me (independent of midlevel providers or residents) on the following activities: development of treatment plan with patient and/or surrogate as well as nursing, discussions with consultants, evaluation of patient's response to treatment, examination of patient, obtaining history from patient or surrogate, ordering and performing treatments and interventions, ordering and review of laboratory studies, ordering and review of radiographic studies, pulse oximetry and re-evaluation of patient's condition.     Signed, Glori Bickers, MD  10/08/2019, 8:11 AM

## 2019-10-08 NOTE — Progress Notes (Signed)
Occupational Therapy Treatment Patient Details Name: Beverly [name] MRN: [id number] DOB: [birth year] Today's Date: 10/08/2019    History of present illness 34 yo admitted 09/23/19 2 weeks post partum after emergent C section due to rupture of membranes with VF arrest. Pt with enterobacter PNA, trach 8/30, encephalopathy. PMHx: HTN   OT comments  Much of session focus on attempts to engage and stimulate pt. Pt sporadically opening eyes to auditory stimuli and with HOB upright (via chair position) start of session. Pt continues to have delayed response to noxious stimuli in bil UE (most notably in 4th and 5th digit) as well as delayed blink to threat when eyes are open. VSS throughout activity completion. Use of music for additional engagement (per pt's mother pt enjoys [place]). Assisted RN with repositioning to L side for pressure relief during session. Will continue per POC at this time.    Follow Up Recommendations  Other (comment) (to be further assesessed )    Equipment Recommendations  Other (comment) (TBA)          Precautions / Restrictions Precautions Precautions: Fall Precaution Comments: flexi, bil prevalon, trach, vent  Restrictions Weight Bearing Restrictions: No       Mobility Bed Mobility Overal bed mobility: Needs Assistance Bed Mobility: Supine to Sit     Supine to sit: Total assist     General bed mobility comments: bed egress to chair   Transfers                 General transfer comment: not appropriate to attempt     Balance                                           ADL either performed or assessed with clinical judgement   ADL Overall ADL's : Needs assistance/impaired                                       General ADL Comments: totalA, use of hand over hand via RUE for face washing task      Vision   Additional Comments: opening eyes moreso at start of session with auditory stimuli and HOB upright.  delayed blink to threat    Perception     Praxis      Cognition Arousal/Alertness: Lethargic Behavior During Therapy: Flat affect Overall Cognitive Status: Impaired/Different from baseline Area of Impairment: Rancho level               Rancho Levels of Cognitive Functioning Ranch[place] Los Amigos Scales of Cognitive Functioning: Generalized response               General Comments: pt with sporadic eye opening initially to auditory stimuli as well as with bed upright in chair position.         Exercises Exercises: General Upper Extremity General Exercises - Upper Extremity Shoulder Flexion: PROM;Both;10 reps Shoulder Horizontal ABduction: PROM;Both;10 reps Shoulder Horizontal ADduction: PROM;Both;10 reps Elbow Flexion: PROM;Both;10 reps Elbow Extension: PROM;Both;10 reps   Shoulder Instructions       General Comments      Pertinent Vitals/ Pain       Pain Assessment: Faces Faces Pain Scale: Hurts a little bit Pain Location: withdraw from noxious/painful stimuli only Pain Descriptors / Indicators:  (withdraw) Pain Intervention(s): Monitored during session  Home Living                                          Prior Functioning/Environment              Frequency  Min 2X/week        Progress Toward Goals  OT Goals(current goals can now be found in the care plan section)  Progress towards OT goals: Progressing toward goals  Acute Rehab OT Goals Patient Stated Goal: return home, care for her kids OT Goal Formulation: Patient unable to participate in goal setting Time For Goal Achievement: 10/21/19 Potential to Achieve Goals: Fair ADL Goals Pt Will Perform Grooming: with max assist;sitting Pt/caregiver will Perform Home Exercise Program: Increased strength;Increased ROM;Both right and left upper extremity;With minimal assist;With written HEP provided Additional ADL Goal #1: Pt will follow 1 step command with 25%  accuracy. Additional ADL Goal #2: Pt will visually track/attend to meaningful item with max cues.  Plan Discharge plan remains appropriate    Co-evaluation                 AM-PAC OT "6 Clicks" Daily Activity     Outcome Measure   Help from another person eating meals?: Total Help from another person taking care of personal grooming?: Total Help from another person toileting, which includes using toliet, bedpan, or urinal?: Total Help from another person bathing (including washing, rinsing, drying)?: Total Help from another person to put on and taking off regular upper body clothing?: Total Help from another person to put on and taking off regular lower body clothing?: Total 6 Click Score: 6    End of Session Equipment Utilized During Treatment: Oxygen  OT Visit Diagnosis: Other abnormalities of gait and mobility (R26.89);Other symptoms and signs involving cognitive function;Muscle weakness (generalized) (M62.81)   Activity Tolerance Patient tolerated treatment well   Patient Left in bed;with call bell/phone within reach   Nurse Communication Mobility status        Time: 5027-7412 OT Time Calculation (min): 21 min  Charges: OT General Charges $OT Visit: 1 Visit OT Treatments $Self Care/Home Management : 8-22 mins  Marcy Siren, OT Acute Rehabilitation Services Pager [phone] Office [phone]    Orlando Penner 10/08/2019, 2:22 PM

## 2019-10-08 NOTE — Progress Notes (Signed)
NAME:  Beverly [name], MRN:  [id number], DOB:  03/07/1985, LOS: 14 ADMISSION DATE:  09/24/2019, CONSULTATION DATE:  8/18 REFERRING MD:  Dr. Eldridge Dace, CHIEF COMPLAINT:  VF arrest   Brief History   34 year old female 2 weeks post partum presented with chest pain and suffered VF arrest.    History of present illness   Patient is encephalopathic and/or intubated. Therefore history has been obtained from chart review.  34 year old female with PMH as below, which is significant for HTN and 2 weeks post-partum. She had emergency C-section delivery due to premature rupture of membranes. She did have some gestational hypertension, but her pregnancy was otherwise uncomplicated. On 8/17 she developed dull chest pain shortly after eating, which prompted ED presentation at [facility]. Resolved spontaneously. No associated symptoms.   Past Medical History   has a past medical history of Hypertension and Vaginal Pap smear, abnormal.  Significant Hospital Events   8/17 chest pain, VF arrest; While in ED, she suffered VT > VF arrest with ROSC after defib x 3 and epi x 1. Immediately after arrest ST elevation was noted on EKG, which did improve on repeat. Differential ACS, SCAD, and toresades/metabolic. Electrolytes were replaced. She was taken to the cath lab at [facility]  Where aneurysmal dilation of the ostial and proximal LAD and was transferred to [facility] for ICU admission.  8/23 possibly febrile. Pan-cultures sent. Still on full support. Gets hypertensive when sedation let up. abx started later in day when temp >101 8/24 opens eyes to stimulus. + strong cough. Tolerating PSV but mental status still not supporting extubation  8/25 about the same  8/27: Seems to be moving a little bit more but mostly posturing.  Opening eyes more.  Had a run of SVT. Consults:    Procedures:  8/18> Central Line, removed 8/26 PICC line placed 8/30 trach 8/31 PEG  Significant Diagnostic Tests:  LHC 8/17 > No  coronary artery occlusion or critical stenosis. There is aneurysmal dilation of the ostial and proximal LAD with possible ulceration or dissection of uncertain chronicity.  TIMI-3 flow is noted throughout the LAD and its branches. Severely reduced left ventricular systolic function with mid/apical anterior, apical, and apical inferior hypokinesis/akinesis. LV gram 30% EF.  CT head 8/17 > normal  CTA chest 8/17 > no evidence of PE, moderate severity infiltrates vs ATX, small bilateral pleural effusions. Very small pericaradial effusion.  CT abdomen/pelvis 8/17 > Enlarged, heterogeneous uterus, Small amount of fluid seen in between the right kidney and right lobe of the liver. Echo 8/18> LVEF 30-35%, septal and apical akinesis distal anterior wall an d mid/distal inferior wall hypokinesis hyperdynamic basal function. LV size mildly dilated.  MRI brain 8/23: Normal  Micro Data:  Blood 8/18 > Respiratory 8/23: Few GPC, rare G VR rare Enterobacter UC 8/23>>> negative Blood 8/23>> Antimicrobials:  Ceftriazone 8/18 >completed 5 days Azithromycin 8/18 >8/18 Cefepime 8/23 >>  Vancomycin 8/23-->8/25 Interim history/subjective:  No events. Opens eyes occasionally to stimulation with some questionable purposeful movement of RUE.  Objective   Blood pressure 126/83, pulse (!) 107, temperature (!) 100.4 F (38 C), resp. rate (!) 24, height 5\' 10"  (1.778 m), weight 95.6 kg, SpO2 99 %, unknown if currently breastfeeding. CVP:  [6 mmHg-8 mmHg] 8 mmHg  Vent Mode: PSV;CPAP FiO2 (%):  [40 %-100 %] 40 % Set Rate:  [15 bmp] 15 bmp Vt Set:  [520 mL] 520 mL PEEP:  [5 cmH20] 5 cmH20 Pressure Support:  [  8 cmH20] 8 cmH20 Plateau Pressure:  [16 cmH20-19 cmH20] 19 cmH20   Intake/Output Summary (Last 24 hours) at 10/08/2019 1100 Last data filed at 10/08/2019 0800 Gross per 24 hour  Intake 834.15 ml  Output 1335 ml  Net -500.85 ml   Filed Weights   10/05/19 0327 10/06/19 0327 10/08/19 0200  Weight: 99.5 kg 99  kg 95.6 kg    Examination: GEN: no acute distress HEENT: trach in place, copious oral secretions CV: RRR, ext warm PULM: Clear, no wheezing GI: Soft, +BS, PEG in place EXT: No edema NEURO: moves RUE occasionally, opens eyes to pain PSYCH: cannot assess SKIN: no rashes  BMP low mg/K to be repleted WBC slightly up Borderline fevers  Resolved Hospital Problem list   Hypokalemia  Assessment & Plan:   VF Arrest New Onset Post Partum Cardiomyopathy, EF 35% HFrEF New SVT on 8/27 -Appreciate cardiology management.  Continue carvedilol, Entresto, spironolactone, BiDil, digoxin, aspirin -Follow BMP, replace electrolytes as indicated  Acute metabolic encephalopathy post cardiac arrest Currently MRI negative x 2, EEG negative for seizures; now off sedation.  Plan is to continue maximal level of care in hopes for recovery. - s/p trach/PEG -Keppra to PO, end date 7 days -Avoid fevers -All sedation held, Dr. Gala Romney is reaching back out to neurology to help with timeline of prognostication  Acute Hypoxemic Respiratory Failure 2/2 to Cardiac Arrest, Pulmonary Edema further complicated by Enterobacter pneumonia (S) to cefepime  Tolerating pressure support ventilation well however mental status precludes extubation.  Cefepime end date 8/29 for 7 days therapy. Plan - Vent wean as able  Borderline fevers, elevated WBC- pan cultured, check Pct, monitor curve and hold on abx for now  Best practice:  Diet: TF Pain/Anxiety/Delirium protocol (if indicated): not needed VAP protocol (if indicated): yes DVT prophylaxis: lovenox GI prophylaxis: PPI Glucose control: SSI Mobility: BR Code Status: FULL  Family Communication: will update when they come in Disposition: ICU   The patient is critically ill with multiple organ systems failure and requires high complexity decision making for assessment and support, frequent evaluation and titration of therapies, application of advanced  monitoring technologies and extensive interpretation of multiple databases. Critical Care Time devoted to patient care services described in this note independent of APP/resident time (if applicable)  is 32 minutes.   Myrla Halsted MD [facility] 10/08/2019 11:00 AM Personal pager: [phone] If unanswered, please page CCM On-call: #[phone]

## 2019-10-08 NOTE — Progress Notes (Signed)
Referring Physician(s): Levon Hedger  Supervising Physician: Irish Lack  Patient Status:  [facility] - In-pt  Chief Complaint: G-tube placement 10/07/19 in IR - seen today for site check.  Subjective:  Sedated, tracheostomy, ventilated. No family or staff at bedside today.   Allergies: Patient has no known allergies.  Medications: Prior to Admission medications   Medication Sig Start Date End Date Taking? Authorizing Provider  ibuprofen (ADVIL) 200 MG tablet Take 400-800 mg by mouth every 6 (six) hours as needed for fever or mild pain.   Yes [provider]  oxyCODONE (OXY IR/ROXICODONE) 5 MG immediate release tablet Take 5 mg by mouth every 4 (four) hours as needed for severe pain.   Yes [provider]  acetaminophen (TYLENOL) 500 MG tablet Take 500-1,000 mg by mouth every 6 (six) hours as needed for mild pain or fever.    [provider]     Vital Signs: BP 126/83    Pulse (!) 107    Temp (!) 100.4 F (38 C)    Resp (!) 24    Ht 5\' 10"  (1.778 m)    Wt 210 lb 12.2 oz (95.6 kg)    SpO2 99%    BMI 30.24 kg/m   Physical Exam Vitals and nursing note reviewed.  Constitutional:      General: She is not in acute distress.    Appearance: She is ill-appearing.  HENT:     Head: Normocephalic.  Cardiovascular:     Rate and Rhythm: Tachycardia present.  Pulmonary:     Comments: (+) trach, vent Abdominal:     General: There is no distension.     Palpations: Abdomen is soft.     Comments: (+) G tube to wall suction. Insertion site with scant dried blood on dressing. No active bleeding or drainage.   Skin:    General: Skin is warm and dry.  Neurological:     Mental Status: Mental status is at baseline.     Imaging: EEG  Result Date: 10/07/2019 10/09/2019, MD     10/07/2019  4:11 PM Patient Name:Beverly [name] 10/09/2019 [facility] Attending:Priyanka ZRA:[id number] Referring Physician/Provider:Dr. [name] Date: 10/07/2019  Duration:23.35 mins  Patient history:34 year old female status post cardiac arrest and seizure-like activity. EEG to assess for seizures.  Level of alertness:Comatose  AEDs during EEG study:Keppra  Technical aspects: This EEG study was done with scalp electrodes positioned according to the 10-20 International system of electrode placement. Electrical activity was acquired at [name] sampling rate of 500Hz  and reviewed with [name] high frequency filter of 70Hz  and [name] low frequency filter of 1Hz . EEG data were recorded continuously and digitally stored.  Description:EEG showed continuous generalized 3-6Hz  theta-delta slowing as well as 2-3Hz  delta activity. EEG was reactive to tactile stimulation. Hyperventilation and photic stimulation were not performed. ABNORMALITY -Continuous slow, generalized  IMPRESSION: This study issuggestive of severe diffuse encephalopathy, nonspecific to etiology.No seizures ordefinite epileptiform discharges were seen throughout the recording.  34  MR BRAIN WO CONTRAST  Result Date: 10/07/2019 CLINICAL DATA:  Anoxic brain injury. EXAM: MRI HEAD WITHOUT CONTRAST TECHNIQUE: Multiplanar, multiecho pulse sequences of the brain and surrounding structures were obtained without intravenous contrast. COMPARISON:  09/29/2019 MRI head.  09/23/2019 head CT. FINDINGS: Some image sequences are degraded by motion artifact. Brain: No focal parenchymal signal abnormality. No diffusion-weighted signal abnormality. No acute infarct or intracranial hemorrhage. No midline shift, ventriculomegaly or extra-axial fluid collection. No mass lesion. Vascular: Normal flow voids.  Skull and upper cervical spine: Normal marrow signal. Sinuses/Orbits: Normal orbits. Clear paranasal sinuses. No mastoid effusion. Other: None. IMPRESSION: No evidence of anoxic brain injury. Normal MRI of the brain. Electronically Signed   By: Stana Bunting M.D.   On: 10/07/2019 14:17   IR GASTROSTOMY TUBE  MOD SED  Result Date: 10/07/2019 INDICATION: 34 year old female with persistently altered mental status and dysphagia. She requires percutaneous gastrostomy tube for long-term nutrition. EXAM: Fluoroscopically guided placement of percutaneous pull-through gastrostomy tube Interventional Radiologist:  Sterling Big, MD MEDICATIONS: 2 g Ancef; Antibiotics were administered within 1 hour of the procedure. ANESTHESIA/SEDATION: None CONTRAST:  70mL OMNIPAQUE IOHEXOL 300 MG/ML  SOLN FLUOROSCOPY TIME:  Fluoroscopy Time: 0 minutes 36 seconds (4 mGy). COMPLICATIONS: None immediate. PROCEDURE: Informed written consent was obtained from the patient after [name] thorough discussion of the procedural risks, benefits and alternatives. All questions were addressed. Maximal Sterile Barrier Technique was utilized including caps, mask, sterile gowns, sterile gloves, sterile drape, hand hygiene and skin antiseptic. [name] timeout was performed prior to the initiation of the procedure. Maximal barrier sterile technique utilized including caps, mask, sterile gowns, sterile gloves, large sterile drape, hand hygiene, and chlorhexadine skin prep. An angled catheter was advanced over [name] wire under fluoroscopic guidance through the nose, down the esophagus and into the body of the stomach. The stomach was then insufflated with several 100 ml of air. Fluoroscopy confirmed location of the gastric bubble, as well as inferior displacement of the barium stained colon. Under direct fluoroscopic guidance, [name] single T-tack was placed, and the anterior gastric wall drawn up against the anterior abdominal wall. Percutaneous access was then obtained into the mid gastric body with an 18 gauge sheath needle. Aspiration of air, and injection of contrast material under fluoroscopy confirmed needle placement. An Amplatz wire was advanced in the gastric body and the access needle exchanged for [name] 9-French vascular sheath. [name] snare device was advanced through the  vascular sheath and an Amplatz wire advanced through the angled catheter. The Amplatz wire was successfully snared and this was pulled up through the esophagus and out the mouth. [name] 20-French Burnell Blanks MIC-PEG tube was then connected to the snare and pulled through the mouth, down the esophagus, into the stomach and out to the anterior abdominal wall. Hand injection of contrast material confirmed intragastric location. The T-tack retention suture was then cut. The pull through peg tube was then secured with the external bumper and capped. The patient will be observed for several hours with the newly placed tube on low wall suction to evaluate for any post procedure complication. The patient tolerated the procedure well, there is no immediate complication. IMPRESSION: Successful placement of [name] 20 French pull through gastrostomy tube. Electronically Signed   By: Malachy Moan M.D.   On: 10/07/2019 16:45   DG Chest Port 1 View  Result Date: 10/06/2019 CLINICAL DATA:  Status post tracheostomy. EXAM: PORTABLE CHEST 1 VIEW COMPARISON:  Prior chest radiographs 10/05/2019 and earlier. FINDINGS: Interval placement of [name] tracheostomy tube. The tracheostomy tube tip projects at the level of the clavicular heads. Unchanged position of [name] right-sided PICC. Unchanged cardiomegaly. New from the prior examination there is opacity at the right lung base which may reflect atelectasis and/or pleural effusion. [name] right basilar pneumonia cannot be excluded. Minimal left basilar atelectasis. No evidence of pneumothorax. No acute bony abnormality identified. IMPRESSION: Interval placement of [name] tracheostomy tube with tip projecting at the level of the clavicular heads. New from the prior  examination of 10/05/2019, there is right basilar opacity which may reflect atelectasis and/or pleural effusion. Right basilar aspiration/pneumonia is difficult to exclude. Persistent minimal left basilar atelectasis. Unchanged cardiomegaly.  Electronically Signed   By: Jackey Loge DO   On: 10/06/2019 14:57   DG Chest Port 1 View  Result Date: 10/05/2019 CLINICAL DATA:  S/P PICC central line placement EXAM: PORTABLE CHEST 1 VIEW COMPARISON:  Chest radiograph 10/05/2019 FINDINGS: Interval repositioning of [name] right upper extremity PICC with tip projecting at the cavoatrial junction. Otherwise stable support apparatus. No pneumothorax. Low lung volumes. Stable cardiomediastinal contours with enlarged heart size. Possible left basilar atelectasis. IMPRESSION: Interval repositioning of [name] right upper extremity PICC with tip projecting at the cavoatrial junction. Electronically Signed   By: Emmaline Kluver M.D.   On: 10/05/2019 16:44   DG Chest Port 1 View  Result Date: 10/05/2019 CLINICAL DATA:  Follow-up diffuse pulmonary opacities. EXAM: PORTABLE CHEST 1 VIEW COMPARISON:  September 29, 2019 FINDINGS: The ETT is in good position. The NG tube terminates below today's film. [name] new right PICC line terminates within the right side of the heart, approximately 5 cm deep to the caval atrial junction. Stable cardiomegaly. The hila and mediastinum are unchanged. No pneumothorax. No overt edema. No focal infiltrates. IMPRESSION: 1. [name] new right PICC line terminates 5 cm into the right side of the heart. Recommend withdrawing 5 cm. Other support apparatus as above. 2. No other acute abnormalities are identified. These results will be called to the ordering clinician or representative by the Radiologist Assistant, and communication documented in the PACS or [employer]. Electronically Signed   By: Gerome Sam III M.D   On: 10/05/2019 10:43    Labs:  CBC: Recent Labs    10/02/19 0349 10/05/19 0403 10/06/19 0355 10/08/19 0335  WBC 7.6 11.4* 11.1* 14.6*  HGB 8.2* 9.7* 9.4* 9.3*  HCT 28.2* 32.3* 31.4* 30.9*  PLT 524* 627* 639* 654*    COAGS: Recent Labs    09/23/19 1844 09/24/19 0824  INR 1.3* 1.2  APTT 45* 32    BMP: Recent Labs     10/05/19 0403 10/06/19 0355 10/07/19 0353 10/08/19 0335  NA 136 140 140 137  K 4.1 4.0 3.8 3.8  CL 102 102 103 100  CO2 28 28 25 25   GLUCOSE 103* 108* 86 97  BUN 18 13 13 13   CALCIUM 9.2 9.4 9.2 9.6  CREATININE 0.42* 0.48 0.45 0.52  GFRNONAA >60 >60 >60 >60  GFRAA >60 >60 >60 >60    LIVER FUNCTION TESTS: Recent Labs    09/24/19 0039 09/25/19 0325 09/27/19 0321  BILITOT 0.6 0.7 0.6  AST 200* 151* 41  ALT 69* 64* 40  ALKPHOS 99 95 84  PROT 6.9 6.9 6.6  ALBUMIN 2.7* 2.7* 2.4*    Assessment and Plan:  34 y/o F s/p placement of percutaneous 20 Fr pull-through gastrostomy tub 10/07/19 in IR seen today for site check.   Insertion site clean, dry, without drainage or bleeding. Tube remains to suction this AM.  May remove from suction and begin use for meds, feeding, free water, etc. now  Further plans per primary team - please call IR with any questions or concerns.  Electronically Signed: 34, PA-C 10/08/2019, 10:49 AM   I spent [name] total of 15 Minutes at the the patient's bedside AND on the patient's hospital floor or unit, greater than 50% of which was counseling/coordinating care for g tube site check.

## 2019-10-08 NOTE — Progress Notes (Signed)
Nutrition Follow-up  DOCUMENTATION CODES:   Not applicable  INTERVENTION:   Tube feeding:  -Vital 1.5 @ 50 ml/hr via PEG (1200 ml) -90 ml ProSource TID -Free water flushes 200 ml Q6 hrs   Provides: 2040 kcals,147 grams protein, 917 ml free water. Meets 100% of needs (1717 ml total free water).   NUTRITION DIAGNOSIS:   Increased nutrient needs related to post-op healing as evidenced by estimated needs.  Ongoing  GOAL:   Patient will meet greater than or equal to 90% of their needs  Addressed via TF  MONITOR:   Vent status, Skin, TF tolerance, I & O's, Labs, Weight trends  REASON FOR ASSESSMENT:   Ventilator    ASSESSMENT:   Patient with PMH significant for HTN and 2 weeks post partum emergency C-section. Presents this admission with new onset peripartum cardiomyopathy with VF arrest.   8/30- trach  Pt remains unresponsive. Repeat MRI negative. Had PEG placed yesterday. Okay to use at noon today. Transition formula to better meet needs.   Admission weight: 103.1 kg  Current weight: 95.6 kg   Patient requiring ventilator support via trach  MV: 10.1 L/min Temp (24hrs), Avg:99.9 F (37.7 C), Min:98.7 F (37.1 C), Max:100.4 F (38 C)   I/O: -7,594 ml since admit  UOP: 1,255 ml x 24 hrs  Stool: 100 ml x 24 hrs   Medications: colace, SS novolog, miralax, prenatal MVI, aldactone  Labs: CBG 86-108  NUTRITION - FOCUSED PHYSICAL EXAM:    Most Recent Value  Orbital Region No depletion  Upper Arm Region No depletion  Thoracic and Lumbar Region Unable to assess  Buccal Region No depletion  Temple Region No depletion  Clavicle Bone Region No depletion  Clavicle and Acromion Bone Region No depletion  Scapular Bone Region Unable to assess  Dorsal Hand No depletion  Patellar Region No depletion  Anterior Thigh Region No depletion  Posterior Calf Region No depletion  Edema (RD Assessment) Mild  Hair Reviewed  Eyes Unable to assess  Mouth Unable to assess   Skin Reviewed  Nails Reviewed     Diet Order:   Diet Order            Diet NPO time specified  Diet effective midnight                 EDUCATION NEEDS:   Not appropriate for education at this time  Skin:  Skin Assessment: Reviewed RN Assessment  Last BM:  8/31  Height:   Ht Readings from Last 1 Encounters:  09/24/19 5\' 10"  (1.778 m)    Weight:   Wt Readings from Last 1 Encounters:  10/08/19 95.6 kg    BMI:  Body mass index is 30.24 kg/m.  Estimated Nutritional Needs:   Kcal:  12/08/19 kcal  Protein:  143-191 grams  Fluid:  >/= 1.8 L/day   [phone] RD, LDN Clinical Nutrition Pager listed in AMION

## 2019-10-09 LAB — BASIC METABOLIC PANEL
Anion gap: 10 (ref 5–15)
BUN: 13 mg/dL (ref 6–20)
CO2: 25 mmol/L (ref 22–32)
Calcium: 9.4 mg/dL (ref 8.9–10.3)
Chloride: 104 mmol/L (ref 98–111)
Creatinine, Ser: 0.52 mg/dL (ref 0.44–1.00)
GFR calc Af Amer: >60 mL/min (ref >60)
GFR calc non Af Amer: >60 mL/min (ref >60)
Glucose, Bld: 122 mg/dL — ABNORMAL HIGH (ref 70–99)
Potassium: 4.1 mmol/L (ref 3.5–5.1)
Sodium: 139 mmol/L (ref 135–145)

## 2019-10-09 LAB — GLUCOSE, CAPILLARY
Glucose-Capillary: 112 mg/dL — ABNORMAL HIGH (ref 70–99)
Glucose-Capillary: 117 mg/dL — ABNORMAL HIGH (ref 70–99)
Glucose-Capillary: 118 mg/dL — ABNORMAL HIGH (ref 70–99)
Glucose-Capillary: 124 mg/dL — ABNORMAL HIGH (ref 70–99)
Glucose-Capillary: 125 mg/dL — ABNORMAL HIGH (ref 70–99)
Glucose-Capillary: 132 mg/dL — ABNORMAL HIGH (ref 70–99)
Glucose-Capillary: 94 mg/dL (ref 70–99)

## 2019-10-09 LAB — MAGNESIUM: Magnesium: 2.1 mg/dL (ref 1.7–2.4)

## 2019-10-09 LAB — CBC
HCT: 30.9 % — ABNORMAL LOW (ref 36.0–46.0)
Hemoglobin: 9.2 g/dL — ABNORMAL LOW (ref 12.0–15.0)
MCH: 25.4 pg — ABNORMAL LOW (ref 26.0–34.0)
MCHC: 29.8 g/dL — ABNORMAL LOW (ref 30.0–36.0)
MCV: 85.4 fL (ref 80.0–100.0)
Platelets: 671 10*3/uL — ABNORMAL HIGH (ref 150–400)
RBC: 3.62 MIL/uL — ABNORMAL LOW (ref 3.87–5.11)
RDW: 17.9 % — ABNORMAL HIGH (ref 11.5–15.5)
WBC: 13.5 10*3/uL — ABNORMAL HIGH (ref 4.0–10.5)
nRBC: 0 % (ref 0.0–0.2)

## 2019-10-09 LAB — URINE CULTURE: Culture: NO GROWTH

## 2019-10-09 LAB — COOXEMETRY PANEL
Carboxyhemoglobin: 1.3 % (ref 0.5–1.5)
Methemoglobin: 0.6 % (ref 0.0–1.5)
O2 Saturation: 78.2 %
Total hemoglobin: 8.6 g/dL — ABNORMAL LOW (ref 12.0–16.0)

## 2019-10-09 LAB — PHOSPHORUS: Phosphorus: 4 mg/dL (ref 2.5–4.6)

## 2019-10-09 MED ORDER — VANCOMYCIN HCL 2000 MG/400ML IV SOLN
2000.0000 mg | Freq: Once | INTRAVENOUS | Status: AC
  Administered 2019-10-09: 2000 mg via INTRAVENOUS
  Filled 2019-10-09: qty 400

## 2019-10-09 MED ORDER — VANCOMYCIN HCL 1500 MG/300ML IV SOLN
1500.0000 mg | Freq: Two times a day (BID) | INTRAVENOUS | Status: DC
  Administered 2019-10-09 – 2019-10-10 (×2): 1500 mg via INTRAVENOUS
  Filled 2019-10-09 (×2): qty 300

## 2019-10-09 MED ORDER — SODIUM CHLORIDE 0.9 % IV SOLN
2.0000 g | Freq: Three times a day (TID) | INTRAVENOUS | Status: AC
  Administered 2019-10-09 – 2019-10-15 (×21): 2 g via INTRAVENOUS
  Filled 2019-10-09 (×23): qty 2

## 2019-10-09 NOTE — Progress Notes (Addendum)
ADVANCED HF PROGRESS  Patient Name: Beverly [name] Date of Encounter: 10/09/2019  [facility] Cardiologist: No primary care provider on file.   Subjective   Now s/p Trach & PEG  On vent does not follow commands.   Inpatient Medications    Scheduled Meds: . aspirin  81 mg Per Tube Daily  . atorvastatin  80 mg Per Tube Daily  . carvedilol  6.25 mg Per Tube BID WC  . chlorhexidine gluconate (MEDLINE KIT)  15 mL Mouth Rinse BID  . Chlorhexidine Gluconate Cloth  6 each Topical Daily  . digoxin  0.125 mg Per Tube Daily  . docusate  100 mg Per Tube BID  . enoxaparin (LOVENOX) injection  40 mg Subcutaneous Q24H  . feeding supplement (PROSource TF)  90 mL Per Tube TID  . free water  200 mL Per Tube Q6H  . insulin aspart  0-15 Units Subcutaneous Q4H  . levETIRAcetam  500 mg Per Tube BID  . mouth rinse  15 mL Mouth Rinse 10 times per day  . modafinil  100 mg Per Tube Daily  . pantoprazole (PROTONIX) IV  40 mg Intravenous Q24H  . polyethylene glycol  17 g Per Tube Daily  . prenatal multivitamin  1 tablet Per Tube Q1200  . sacubitril-valsartan  1 tablet Per Tube BID  . scopolamine  1 patch Transdermal Q72H  . sodium chloride flush  10-40 mL Intracatheter Q12H  . spironolactone  25 mg Per Tube Daily   Continuous Infusions: . sodium chloride Stopped (10/07/19 1511)  . feeding supplement (VITAL 1.5 CAL) 1,000 mL (10/09/19 0342)   PRN Meds: acetaminophen (TYLENOL) oral liquid 160 mg/5 mL, hydrALAZINE, midazolam, sodium chloride flush   Vital Signs    Vitals:   10/09/19 0403 10/09/19 0500 10/09/19 0600 10/09/19 0700  BP: (!) 142/95 (!) 135/93 140/90 138/90  Pulse:  99 (!) 106 (!) 104  Resp: (!) 22 (!) 21 (!) 26 (!) 25  Temp:      TempSrc:      SpO2:  98% 100% 99%  Weight:      Height:        Intake/Output Summary (Last 24 hours) at 10/09/2019 0748 Last data filed at 10/09/2019 0700 Gross per 24 hour  Intake 650 ml  Output 1235 ml  Net -585 ml   Last 3 Weights  10/09/2019 10/08/2019 10/06/2019  Weight (lbs) 211 lb 10.3 oz 210 lb 12.2 oz 218 lb 4.1 oz  Weight (kg) 96 kg 95.6 kg 99 kg  Some encounter information is confidential and restricted. Go to Review Flowsheets activity to see all data.       Telemetry   Sinus Tach 100-110s   Physical Exam  CVP 5-6  General: On vent through trach.  Neck: Trach, no JVD. Carotids 2+ bilat; no bruits. No lymphadenopathy or thryomegaly appreciated. Cor: PMI nondisplaced. Regular rate & rhythm. No rubs, gallops or murmurs. Lungs: clear Abdomen: peg , soft, nontender, nondistended. No hepatosplenomegaly. No bruits or masses. Good bowel sounds. Extremities: no cyanosis, clubbing, rash, edema Neuro: Opens eyes. No purposeful movement. + Gag   Labs    High Sensitivity Troponin:   Recent Labs  Lab 09/23/19 1539 09/23/19 1816 09/24/19 0039 09/24/19 0826  TROPONINIHS 127* 2,078* 24,421* >27,000*      Chemistry Recent Labs  Lab 10/07/19 0353 10/08/19 0335 10/09/19 0346  NA 140 137 139  K 3.8 3.8 4.1  CL 103 100 104  CO2 _0 GLUCOSE 86  97 122*  BUN _0 CREATININE 0.45 0.52 0.52  CALCIUM 9.2 9.6 9.4  GFRNONAA >60 >60 >60  GFRAA >60 >60 >60  ANIONGAP _1 Hematology Recent Labs  Lab 10/06/19 0355 10/08/19 0335 10/09/19 0346  WBC 11.1* 14.6* 13.5*  RBC 3.64* 3.64* 3.62*  HGB 9.4* 9.3* 9.2*  HCT 31.4* 30.9* 30.9*  MCV 86.3 84.9 85.4  MCH 25.8* 25.5* 25.4*  MCHC 29.9* 30.1 29.8*  RDW 17.6* 18.0* 17.9*  PLT 639* 654* 671*    BNP No results for input(s): BNP, PROBNP in the last 168 hours.   DDimer No results for input(s): DDIMER in the last 168 hours.   Radiology    EEG  Result Date: 10/07/2019 [name], MD     10/07/2019  4:11 PM Patient Name:Beverly [name] AOZ:[id number] Epilepsy Attending:Priyanka Barbra Sarks Referring Physician/Provider:Dr. Ina Homes Date: 10/07/2019 Duration:23.35 mins  Patient history:34 year old female status post cardiac  arrest and seizure-like activity. EEG to assess for seizures.  Level of alertness:Comatose  AEDs during EEG study:Keppra  Technical aspects: This EEG study was done with scalp electrodes positioned according to the 10-20 International system of electrode placement. Electrical activity was acquired at a sampling rate of _2  and reviewed with a high frequency filter of _3  and a low frequency filter of _4 . EEG data were recorded continuously and digitally stored.  Description:EEG showed continuous generalized 3-_5  theta-delta slowing as well as 2-_6  delta activity. EEG was reactive to tactile stimulation. Hyperventilation and photic stimulation were not performed. ABNORMALITY -Continuous slow, generalized  IMPRESSION: This study issuggestive of severe diffuse encephalopathy, nonspecific to etiology.No seizures ordefinite epileptiform discharges were seen throughout the recording.  [name]  MR BRAIN WO CONTRAST  Result Date: 10/07/2019 CLINICAL DATA:  Anoxic brain injury. EXAM: MRI HEAD WITHOUT CONTRAST TECHNIQUE: Multiplanar, multiecho pulse sequences of the brain and surrounding structures were obtained without intravenous contrast. COMPARISON:  09/29/2019 MRI head.  09/23/2019 head CT. FINDINGS: Some image sequences are degraded by motion artifact. Brain: No focal parenchymal signal abnormality. No diffusion-weighted signal abnormality. No acute infarct or intracranial hemorrhage. No midline shift, ventriculomegaly or extra-axial fluid collection. No mass lesion. Vascular: Normal flow voids. Skull and upper cervical spine: Normal marrow signal. Sinuses/Orbits: Normal orbits. Clear paranasal sinuses. No mastoid effusion. Other: None. IMPRESSION: No evidence of anoxic brain injury. Normal MRI of the brain. Electronically Signed   By: Primitivo Gauze M.D.   On: 10/07/2019 14:17   IR GASTROSTOMY TUBE MOD SED  Result Date: 10/07/2019 INDICATION: 34 year old female with  persistently altered mental status and dysphagia. She requires percutaneous gastrostomy tube for long-term nutrition. EXAM: Fluoroscopically guided placement of percutaneous pull-through gastrostomy tube Interventional Radiologist:  Criselda Peaches, MD MEDICATIONS: 2 g Ancef; Antibiotics were administered within 1 hour of the procedure. ANESTHESIA/SEDATION: None CONTRAST:  63m OMNIPAQUE IOHEXOL 300 MG/ML  SOLN FLUOROSCOPY TIME:  Fluoroscopy Time: 0 minutes 36 seconds (4 mGy). COMPLICATIONS: None immediate. PROCEDURE: Informed written consent was obtained from the patient after a thorough discussion of the procedural risks, benefits and alternatives. All questions were addressed. Maximal Sterile Barrier Technique was utilized including caps, mask, sterile gowns, sterile gloves, sterile drape, hand hygiene and skin antiseptic. A timeout was performed prior to the initiation of the procedure. Maximal barrier sterile technique utilized including caps, mask, sterile gowns, sterile gloves, large sterile drape, hand hygiene, and chlorhexadine skin prep. An angled catheter was advanced over a wire under fluoroscopic guidance through the  nose, down the esophagus and into the body of the stomach. The stomach was then insufflated with several 100 ml of air. Fluoroscopy confirmed location of the gastric bubble, as well as inferior displacement of the barium stained colon. Under direct fluoroscopic guidance, a single T-tack was placed, and the anterior gastric wall drawn up against the anterior abdominal wall. Percutaneous access was then obtained into the mid gastric body with an 18 gauge sheath needle. Aspiration of air, and injection of contrast material under fluoroscopy confirmed needle placement. An Amplatz wire was advanced in the gastric body and the access needle exchanged for a 9-French vascular sheath. A snare device was advanced through the vascular sheath and an Amplatz wire advanced through the angled  catheter. The Amplatz wire was successfully snared and this was pulled up through the esophagus and out the mouth. A 20-French Alinda Dooms MIC-PEG tube was then connected to the snare and pulled through the mouth, down the esophagus, into the stomach and out to the anterior abdominal wall. Hand injection of contrast material confirmed intragastric location. The T-tack retention suture was then cut. The pull through peg tube was then secured with the external bumper and capped. The patient will be observed for several hours with the newly placed tube on low wall suction to evaluate for any post procedure complication. The patient tolerated the procedure well, there is no immediate complication. IMPRESSION: Successful placement of a 20 French pull through gastrostomy tube. Electronically Signed   By: Jacqulynn Cadet M.D.   On: 10/07/2019 16:45    Cardiac Studies   See section above (reviewed)  Patient Profile     34 y.o. female 2 weeks post-partum with VF/torsades arrest in the setting of anterior MI  Assessment & Plan    1. VF arrest (in-hospital): in setting of anterior infarct. Rhythm has stabilized, off amiodarone, no significant arrhythmia on continuous telemetry. Continue beta-blockade.  - Now rewarmed and off sedation. But remains essentially unresponsive - EEG 8/20 diffuse slowing but improved - Brain MRI normal x 2. EEG still with diffuse slowing - Vent management per PCCM - VT quiescent. Keep K> 4.0 Mg > 2.0  2. Acute systolic heart failure: secondary to anterior MI, 09/24/19 LVEF 35%.  -CVP 6. Volume status stable.  - On carvedilol 6.25 bid, Entresto 97-103bid, dig 0.125 and spiro 25 mg daily.  - Check CO-OX  - More tachycardic today. -> concern for developing sepsis  3. CAD/Anterior MI: angio reviewed again. Appears to have aneurysmal proximal LAD with dissection, likely culprit. Had TIMI-3 flow at cath but large infarct. Would anticipate relook catheterization once she  recovers, likely medical therapy but will need to reassess.  - Continue ASA,  beta-blocker and statin.  - No evidence of ischemia currently 4. Encephalopathy, post-arrest.Likely anoxic brain injury  - remains essentially unresponsive - MRI 09/29/19 & 10/07/19 negative.  - EEG 8/31 EEG still with diffuse slowing 5. Hypokalemia/hypomag -Stable.  6. Hypertension -Stable. 7. Fevers - ? Neurogenic vs infectious - Continues with low grade fever overnight, mTemp 100.3 - Blx Cultures repeated 9/1 - Respiratory Cx  - UA - moderate leukocytes.  - Start vanc cefepime today.  8. SVT on 10/03/19 - Resolved.      Signed, Darrick Grinder, NP  10/09/2019, 7:48 AM    Agree with above.   Will open eyes but not follow commands or move purposely. D/w Neuro yesterday. Appreciate their input. Started modafinil.   HF status ok. Volume ok. WBC up. UA and resp  gram stain +  General:  Awake on vent.  Will open eyes. Not following commands  HEENT: normal Neck: supple. +TRACH Carotids 2+ bilat; no bruits. No lymphadenopathy or thryomegaly appreciated. Cor: PMI nondisplaced. Regular rate & rhythm. No rubs, gallops or murmurs. Lungs: clear Abdomen: soft, nontender, nondistended. No hepatosplenomegaly. No bruits or masses. Good bowel sounds. + PEG Extremities: no cyanosis, clubbing, rash, edema Neuro: awake but not following commands or moving purposefully  Appears to have some degree of anoxic brain injury. Neuro feels she may continue to recover slowly.  HF status improved. UA and resp gs + .Will start abx (CCM to adjust as needed). Await cultures.   Needs LTAC.  CRITICAL CARE Performed by: Glori Bickers  Total critical care time: 35 minutes  Critical care time was exclusive of separately billable procedures and treating other patients.  Critical care was necessary to treat or prevent imminent or life-threatening deterioration.  Critical care was time spent personally by me (independent of midlevel  providers or residents) on the following activities: development of treatment plan with patient and/or surrogate as well as nursing, discussions with consultants, evaluation of patient's response to treatment, examination of patient, obtaining history from patient or surrogate, ordering and performing treatments and interventions, ordering and review of laboratory studies, ordering and review of radiographic studies, pulse oximetry and re-evaluation of patient's condition.  Glori Bickers, MD  8:12 AM

## 2019-10-09 NOTE — Progress Notes (Signed)
RT NOTE: RT placed patient on 35% ATC. Patient tolerating well at this time. Vitals are stable and sats are 95%. RT will continue to monitor.

## 2019-10-09 NOTE — Progress Notes (Signed)
NAME:  Beverly [name], MRN:  [id number], DOB:  May 10, 1985, LOS: 15 ADMISSION DATE:  09/24/2019, CONSULTATION DATE:  8/18 REFERRING MD:  Dr. Eldridge Dace, CHIEF COMPLAINT:  VF arrest   Brief History   34 year old female 2 weeks post partum presented with chest pain and suffered VF arrest.    History of present illness   Patient is encephalopathic and/or intubated. Therefore history has been obtained from chart review.  34 year old female with PMH as below, which is significant for HTN and 2 weeks post-partum. She had emergency C-section delivery due to premature rupture of membranes. She did have some gestational hypertension, but her pregnancy was otherwise uncomplicated. On 8/17 she developed dull chest pain shortly after eating, which prompted ED presentation at [facility]. Resolved spontaneously. No associated symptoms.   Past Medical History   has a past medical history of Hypertension and Vaginal Pap smear, abnormal.  Significant Hospital Events   8/17 chest pain, VF arrest; While in ED, she suffered VT > VF arrest with ROSC after defib x 3 and epi x 1. Immediately after arrest ST elevation was noted on EKG, which did improve on repeat. Differential ACS, SCAD, and toresades/metabolic. Electrolytes were replaced. She was taken to the cath lab at [facility]  Where aneurysmal dilation of the ostial and proximal LAD and was transferred to [facility] for ICU admission.  8/23 possibly febrile. Pan-cultures sent. Still on full support. Gets hypertensive when sedation let up. abx started later in day when temp >101 8/24 opens eyes to stimulus. + strong cough. Tolerating PSV but mental status still not supporting extubation  8/25 about the same  8/27: Seems to be moving a little bit more but mostly posturing.  Opening eyes more.  Had a run of SVT. Consults:    Procedures:  8/18> Central Line, removed 8/26 PICC line placed 8/30 trach 8/31 PEG  Significant Diagnostic Tests:  LHC 8/17 > No  coronary artery occlusion or critical stenosis. There is aneurysmal dilation of the ostial and proximal LAD with possible ulceration or dissection of uncertain chronicity.  TIMI-3 flow is noted throughout the LAD and its branches. Severely reduced left ventricular systolic function with mid/apical anterior, apical, and apical inferior hypokinesis/akinesis. LV gram 30% EF.  CT head 8/17 > normal  CTA chest 8/17 > no evidence of PE, moderate severity infiltrates vs ATX, small bilateral pleural effusions. Very small pericaradial effusion.  CT abdomen/pelvis 8/17 > Enlarged, heterogeneous uterus, Small amount of fluid seen in between the right kidney and right lobe of the liver. Echo 8/18> LVEF 30-35%, septal and apical akinesis distal anterior wall an d mid/distal inferior wall hypokinesis hyperdynamic basal function. LV size mildly dilated.  MRI brain 8/23: Normal  Micro Data:  See micro tab  Latest: Blood 9/1 >> ngtd Urine 9/1 >> Sputum 9/1 >>  Antimicrobials:  See fever tab  Currently Vanc 9/2>> Cefepime 9/2>>  Interim history/subjective:  No events. Low grade fevers, started on HCAP coverage.  Objective   Blood pressure 138/90, pulse (!) 105, temperature 99.9 F (37.7 C), temperature source Oral, resp. rate (!) 24, height 5\' 10"  (1.778 m), weight 96 kg, SpO2 98 %, unknown if currently breastfeeding.    Vent Mode: PSV;CPAP FiO2 (%):  [28 %-40 %] 28 % Set Rate:  [15 bmp] 15 bmp PEEP:  [5 cmH20] 5 cmH20 Pressure Support:  [8 cmH20] 8 cmH20 Plateau Pressure:  [10 cmH20-14 cmH20] 10 cmH20   Intake/Output Summary (Last 24 hours)  at 10/09/2019 0840 Last data filed at 10/09/2019 0700 Gross per 24 hour  Intake 650 ml  Output 1095 ml  Net -445 ml   Filed Weights   10/06/19 0327 10/08/19 0200 10/09/19 0342  Weight: 99 kg 95.6 kg 96 kg    Examination: GEN: no acute distress HEENT: trach in place, copious oral secretions, thick mucoid secretions CV: RRR, ext warm PULM:  tachypneic, rhonci GI: Soft, +BS, PEG in place EXT: No edema NEURO: moves RUE occasionally, opens eyes to pain PSYCH: cannot assess SKIN: no rashes  Ammonia normal K/Mg/Cr good WBC remains slightly elevated Borderline fevers  Resolved Hospital Problem list    Assessment & Plan:   VF Arrest New Onset Post Partum Cardiomyopathy, EF 35% HFrEF New SVT on 8/27 -Appreciate cardiology management.  Continue carvedilol, Entresto, spironolactone, BiDil, digoxin, aspirin -Follow BMP, replace electrolytes as indicated  Acute metabolic encephalopathy post cardiac arrest Currently MRI negative x 2, EEG negative for seizures; now off sedation.  Plan is to continue maximal level of care in hopes for recovery. - s/p trach/PEG - Keppra to PO, end date 7 days - Avoid fevers - Trial of modafenil  Acute Hypoxemic Respiratory Failure 2/2 to Cardiac Arrest, Pulmonary Edema further complicated by Enterobacter pneumonia (S) to cefepime  Tolerating pressure support ventilation well however mental status precludes extubation.  Cefepime end date 8/29 for 7 days therapy. Borderline fevers, elevated WBC - TC trial today - HCAP coverage started, f/u culture data, pct was minimally elevated, low threshold to dc  Best practice:  Diet: TF Pain/Anxiety/Delirium protocol (if indicated): not needed VAP protocol (if indicated): yes DVT prophylaxis: lovenox GI prophylaxis: PPI Glucose control: SSI Mobility: BR Code Status: FULL  Family Communication: will update when they come in Disposition: stable to start looking for LTACH placement   The patient is critically ill with multiple organ systems failure and requires high complexity decision making for assessment and support, frequent evaluation and titration of therapies, application of advanced monitoring technologies and extensive interpretation of multiple databases. Critical Care Time devoted to patient care services described in this note independent  of APP/resident time (if applicable)  is 31 minutes.   Myrla Halsted MD [facility] 10/09/2019 8:40 AM Personal pager: [phone] If unanswered, please page CCM On-call: [phone]

## 2019-10-09 NOTE — Progress Notes (Signed)
Pharmacy Antibiotic Note  Beverly [name] is a 34 y.o. female admitted on 09/24/2019 s/p cardiac arrest.  Recently completed 7-day course of cefepime for enterobacter in sputum culture. Now with staph aureus and rare GNR growing in most recent sputum culture. Pharmacy has been consulted for Vancomycin and cefepime dosing. Intermittent fevers, WBC elevated but trending down at 13.5 today. Renal function stable making good urine output.  Plan: Vancomycin 2000mg  IV once, then 1750 mg IV Q12 hrs Cefepime 2g IV Q8 hrs Monitor renal function, cultures/sensitivities, clinical progression Check vancomycin trough at steady state   Height: 5\' 10"  (177.8 cm) Weight: 96 kg (211 lb 10.3 oz) IBW/kg (Calculated) : 68.5  Temp (24hrs), Avg:99.7 F (37.6 C), Min:98.5 F (36.9 C), Max:100.3 F (37.9 C)  Recent Labs  Lab 10/05/19 0403 10/06/19 0355 10/07/19 0353 10/08/19 0335 10/09/19 0346  WBC 11.4* 11.1*  --  14.6* 13.5*  CREATININE 0.42* 0.48 0.45 0.52 0.52    Estimated Creatinine Clearance: 126.7 mL/min (by C-G formula based on SCr of 0.52 mg/dL).    No Known Allergies  Antimicrobials this admission: Azithro 8/18 >> 8/18 Ceftriaxone 8/18 >> 8/23  Cefepime 8/23 >> 8/30; 9/2>> Vanc 8/23 >>8/25; 9/2>>  Dose adjustments this admission: N/A  Microbiology results: 9/1 TA: few staph aureus/rare GNR 9/1 Ucx: neg 9/1 Bcx: ngtd 8/23 UA: neg 8/23 Sputum Cx: enterobacter (S cefepime) 8/23 Ucx: ngtd final 8/23 Bcx: neg 8/18 BCx: neg  9/23, PharmD PGY2 Cardiology Pharmacy Resident Phone: [phone] 10/09/2019  2:03 PM  Please check [email or web address] for unit-specific pharmacy phone numbers.

## 2019-10-09 NOTE — Progress Notes (Signed)
Physical Therapy Treatment Patient Details Name: Beverly [name] MRN: [id number] DOB: [birth year] Today's Date: 10/09/2019    History of Present Illness 34 yo admitted 09/23/19 2 weeks post partum after emergent C section due to rupture of membranes with VF arrest. Pt with enterobacter PNA, trach 8/30, encephalopathy. PMHx: HTN     PT Comments    Pt maintains only eye opening when positioned into sitting, PROM tolerance. Pt with automatic reflexive bil knee extension with coughing and withdrawal to noxious stimuli all extremities with 5 sec delay. Pt unable to manage secretions and no active engagement. Beyonce played during session with visual, auditory and tactile stimulus. No response to pictures of children in room. PROM all extremities provided with repostioning and upright chair position end of session. Will continue efforts for engagement, brain stimulation, and active movement.   CpAP 28% FiO2 , peep 8, SpO2 100% HR 100 134/92 (104)    Follow Up Recommendations  LTACH     Equipment Recommendations  Other (comment) (TBD)    Recommendations for Other Services       Precautions / Restrictions Precautions Precautions: Fall Precaution Comments: flexi, bil prevalon, trach, vent, PEG    Mobility  Bed Mobility Overal bed mobility: Needs Assistance Bed Mobility: Supine to Sit;Sit to Supine;Rolling Rolling: Total assist;+2 for physical assistance   Supine to sit: Total assist     General bed mobility comments: bed egress to chair and return. total assist to roll bil for pad change. Total +2 to slide toward [facility]  Transfers                 General transfer comment: not appropriate to attempt   Ambulation/Gait                 Stairs             Wheelchair Mobility    Modified Rankin (Stroke Patients Only)       Balance Overall balance assessment: Needs assistance   Sitting balance-Leahy Scale: Zero Sitting balance - Comments: pt without head  or neck engagement or control with trunk shifted off surface                                    Cognition Arousal/Alertness: Lethargic Behavior During Therapy: Flat affect Overall Cognitive Status: Impaired/Different from baseline                 Rancho Levels of Cognitive Functioning Rancho Mirant Scales of Cognitive Functioning: Generalized response               General Comments: pt with eye opening in sitting without visual fixation or tracking      Exercises General Exercises - Upper Extremity Shoulder Flexion: PROM;Both;10 reps;Seated Elbow Flexion: PROM;Both;10 reps;Seated Elbow Extension: PROM;Both;10 reps;Seated General Exercises - Lower Extremity Ankle Circles/Pumps: PROM;Both;Seated;10 reps Long Arc Quad: PROM;Both;Seated;10 reps Hip ABduction/ADduction: PROM;Both;Seated;10 reps Hip Flexion/Marching: PROM;Both;10 reps;Seated    General Comments        Pertinent Vitals/Pain Pain Assessment: No/denies pain    Home Living                      Prior Function            PT Goals (current goals can now be found in the care plan section) Progress towards PT goals: Not progressing toward goals - comment    Frequency  Min 2X/week      PT Plan Current plan remains appropriate    Co-evaluation              AM-PAC PT "6 Clicks" Mobility   Outcome Measure  Help needed turning from your back to your side while in a flat bed without using bedrails?: Total Help needed moving from lying on your back to sitting on the side of a flat bed without using bedrails?: Total Help needed moving to and from a bed to a chair (including a wheelchair)?: Total Help needed standing up from a chair using your arms (e.g., wheelchair or bedside chair)?: Total Help needed to walk in hospital room?: Total Help needed climbing 3-5 steps with a railing? : Total 6 Click Score: 6    End of Session   Activity Tolerance: Patient  tolerated treatment well Patient left: in bed;with bed alarm set Nurse Communication: Mobility status;Need for lift equipment PT Visit Diagnosis: Other abnormalities of gait and mobility (R26.89);Difficulty in walking, not elsewhere classified (R26.2);Other symptoms and signs involving the nervous system (R29.898)     Time: 1601-0932 PT Time Calculation (min) (ACUTE ONLY): 29 min  Charges:  $Therapeutic Activity: 23-37 mins                     Stephonie Wilcoxen P, PT Acute Rehabilitation Services Pager: [phone] Office: [phone]    Manveer Gomes B Anoop Hemmer 10/09/2019, 2:02 PM

## 2019-10-10 LAB — CBC
HCT: 30.1 % — ABNORMAL LOW (ref 36.0–46.0)
Hemoglobin: 9 g/dL — ABNORMAL LOW (ref 12.0–15.0)
MCH: 25.9 pg — ABNORMAL LOW (ref 26.0–34.0)
MCHC: 29.9 g/dL — ABNORMAL LOW (ref 30.0–36.0)
MCV: 86.7 fL (ref 80.0–100.0)
Platelets: 665 10*3/uL — ABNORMAL HIGH (ref 150–400)
RBC: 3.47 MIL/uL — ABNORMAL LOW (ref 3.87–5.11)
RDW: 18 % — ABNORMAL HIGH (ref 11.5–15.5)
WBC: 11.7 10*3/uL — ABNORMAL HIGH (ref 4.0–10.5)
nRBC: 0 % (ref 0.0–0.2)

## 2019-10-10 LAB — CULTURE, RESPIRATORY W GRAM STAIN

## 2019-10-10 LAB — MAGNESIUM: Magnesium: 1.8 mg/dL (ref 1.7–2.4)

## 2019-10-10 LAB — GLUCOSE, CAPILLARY
Glucose-Capillary: 116 mg/dL — ABNORMAL HIGH (ref 70–99)
Glucose-Capillary: 100 mg/dL — ABNORMAL HIGH (ref 70–99)
Glucose-Capillary: 130 mg/dL — ABNORMAL HIGH (ref 70–99)
Glucose-Capillary: 129 mg/dL — ABNORMAL HIGH (ref 70–99)
Glucose-Capillary: 94 mg/dL (ref 70–99)

## 2019-10-10 LAB — BASIC METABOLIC PANEL
Anion gap: 8 (ref 5–15)
BUN: 13 mg/dL (ref 6–20)
CO2: 24 mmol/L (ref 22–32)
Calcium: 9.5 mg/dL (ref 8.9–10.3)
Chloride: 106 mmol/L (ref 98–111)
Creatinine, Ser: 0.41 mg/dL — ABNORMAL LOW (ref 0.44–1.00)
GFR calc Af Amer: >60 mL/min (ref >60)
GFR calc non Af Amer: >60 mL/min (ref >60)
Glucose, Bld: 117 mg/dL — ABNORMAL HIGH (ref 70–99)
Potassium: 4.1 mmol/L (ref 3.5–5.1)
Sodium: 138 mmol/L (ref 135–145)

## 2019-10-10 LAB — PHOSPHORUS: Phosphorus: 4.7 mg/dL — ABNORMAL HIGH (ref 2.5–4.6)

## 2019-10-10 LAB — COOXEMETRY PANEL
Carboxyhemoglobin: 1.6 % — ABNORMAL HIGH (ref 0.5–1.5)
Methemoglobin: 1.1 % (ref 0.0–1.5)
O2 Saturation: 77.7 %
Total hemoglobin: 9.5 g/dL — ABNORMAL LOW (ref 12.0–16.0)

## 2019-10-10 MED ORDER — MAGNESIUM SULFATE 2 GM/50ML IV SOLN
2.0000 g | Freq: Once | INTRAVENOUS | Status: AC
  Administered 2019-10-10: 2 g via INTRAVENOUS
  Filled 2019-10-10: qty 50

## 2019-10-10 MED ORDER — FUROSEMIDE 10 MG/ML IJ SOLN
40.0000 mg | Freq: Once | INTRAMUSCULAR | Status: AC
  Administered 2019-10-10: 40 mg via INTRAVENOUS
  Filled 2019-10-10: qty 4

## 2019-10-10 NOTE — Progress Notes (Signed)
NAME:  Beverly [name], MRN:  [id number], DOB:  04/30/1985, LOS: 16 ADMISSION DATE:  09/24/2019, CONSULTATION DATE:  8/18 REFERRING MD:  Dr. Eldridge Dace, CHIEF COMPLAINT:  VF arrest   Brief History   33 year old female 2 weeks post partum presented with chest pain and suffered VF arrest.    History of present illness   Patient is encephalopathic and/or intubated. Therefore history has been obtained from chart review.  34 year old female with PMH as below, which is significant for HTN and 2 weeks post-partum. She had emergency C-section delivery due to premature rupture of membranes. She did have some gestational hypertension, but her pregnancy was otherwise uncomplicated. On 8/17 she developed dull chest pain shortly after eating, which prompted ED presentation at [facility]. Resolved spontaneously. No associated symptoms.   Past Medical History   has a past medical history of Hypertension and Vaginal Pap smear, abnormal.  Significant Hospital Events   8/17 chest pain, VF arrest; While in ED, she suffered VT > VF arrest with ROSC after defib x 3 and epi x 1. Immediately after arrest ST elevation was noted on EKG, which did improve on repeat. Differential ACS, SCAD, and toresades/metabolic. Electrolytes were replaced. She was taken to the cath lab at [facility]  Where aneurysmal dilation of the ostial and proximal LAD and was transferred to [facility] for ICU admission.  8/23 possibly febrile. Pan-cultures sent. Still on full support. Gets hypertensive when sedation let up. abx started later in day when temp >101 8/24 opens eyes to stimulus. + strong cough. Tolerating PSV but mental status still not supporting extubation  8/25 about the same  8/27: Seems to be moving a little bit more but mostly posturing.  Opening eyes more.  Had a run of SVT. Consults:    Procedures:  8/18> Central Line, removed 8/26 PICC line placed 8/30 trach 8/31 PEG  Significant Diagnostic Tests:  LHC 8/17 > No  coronary artery occlusion or critical stenosis. There is aneurysmal dilation of the ostial and proximal LAD with possible ulceration or dissection of uncertain chronicity.  TIMI-3 flow is noted throughout the LAD and its branches. Severely reduced left ventricular systolic function with mid/apical anterior, apical, and apical inferior hypokinesis/akinesis. LV gram 30% EF.  CT head 8/17 > normal  CTA chest 8/17 > no evidence of PE, moderate severity infiltrates vs ATX, small bilateral pleural effusions. Very small pericaradial effusion.  CT abdomen/pelvis 8/17 > Enlarged, heterogeneous uterus, Small amount of fluid seen in between the right kidney and right lobe of the liver. Echo 8/18> LVEF 30-35%, septal and apical akinesis distal anterior wall an d mid/distal inferior wall hypokinesis hyperdynamic basal function. LV size mildly dilated.  MRI brain 8/23: Normal  Micro Data:  See micro tab  Latest: Blood 9/1 >> ngtd Urine 9/1 >> Sputum 9/1 >>  Antimicrobials:  See fever tab  Currently Vanc 9/2>> Cefepime 9/2>>  Interim history/subjective:  No events. Low grade fevers, started on HCAP coverage.  Objective   Blood pressure 129/86, pulse 95, temperature 97.7 F (36.5 C), resp. rate (!) 22, height 5\' 10"  (1.778 m), weight 96.6 kg, SpO2 98 %, unknown if currently breastfeeding.    FiO2 (%):  [28 %-35 %] 28 %   Intake/Output Summary (Last 24 hours) at 10/10/2019 1057 Last data filed at 10/10/2019 0903 Gross per 24 hour  Intake 2141.29 ml  Output 2850 ml  Net -708.71 ml   Filed Weights   10/08/19 0200 10/09/19 0342  10/10/19 0339  Weight: 95.6 kg 96 kg 96.6 kg    Examination: GEN: no acute distress HEENT: trach in place, copious oral secretions, thick mucoid secretions CV: RRR, ext warm PULM: less tachypneic, +rhonci GI: Soft, +BS, PEG in place EXT: No edema NEURO: moving eyes occasionally tracking, more awake today PSYCH: cannot assess SKIN: no rashes  Ammonia normal Cr  good, mag repleted WBC improved Borderline fevers  Resolved Hospital Problem list    Assessment & Plan:   VF Arrest New Onset Post Partum Cardiomyopathy, EF 35% HFrEF New SVT on 8/27 -Appreciate cardiology management.  Continue carvedilol, Entresto, spironolactone, BiDil, digoxin, aspirin -Follow BMP, replace electrolytes as indicated  Acute metabolic encephalopathy post cardiac arrest Currently MRI negative x 2, EEG negative for seizures; now off sedation.  Plan is to continue maximal level of care in hopes for recovery. - s/p trach/PEG - Keppra to PO, end date 7 days - Avoid fevers - Trial of modafenil  Acute Hypoxemic Respiratory Failure 2/2 to Cardiac Arrest, Pulmonary Edema further complicated by Enterobacter and MSSA pneumonia (S) to cefepime.  8/30 trach - Indefinite TC trial - Trach sutures out 9/4  Best practice:  Diet: TF Pain/Anxiety/Delirium protocol (if indicated): not needed VAP protocol (if indicated): yes DVT prophylaxis: lovenox GI prophylaxis: PPI Glucose control: SSI Mobility: BR Code Status: FULL  Family Communication: updated at bedside Disposition: stable to start looking for LTACH placement   The patient is critically ill with multiple organ systems failure and requires high complexity decision making for assessment and support, frequent evaluation and titration of therapies, application of advanced monitoring technologies and extensive interpretation of multiple databases. Critical Care Time devoted to patient care services described in this note independent of APP/resident time (if applicable)  is 32 minutes.   Myrla Halsted MD [facility] 10/10/2019 10:57 AM Personal pager: [phone] If unanswered, please page CCM On-call: [phone]

## 2019-10-10 NOTE — Evaluation (Signed)
[facility] Speaking Valve - Evaluation Patient Details  Name: Beverly [name] MRN: [id number] Date of Birth: 18-Mar-1985  Today's Date: 10/10/2019 Time: 1130-1144 SLP Time Calculation (min) (ACUTE ONLY): 14 min  Past Medical History:  Past Medical History:  Diagnosis Date  . Hypertension    last pregnancy  . Vaginal Pap smear, abnormal    when she was 34yo   Past Surgical History:  Past Surgical History:  Procedure Laterality Date  . IR GASTROSTOMY TUBE MOD SED  10/07/2019  . LEFT HEART CATH AND CORONARY ANGIOGRAPHY N/A 09/23/2019   Procedure: LEFT HEART CATH AND CORONARY ANGIOGRAPHY;  Surgeon: Yvonne Kendall, MD;  Location: [facility] INVASIVE CV LAB;  Service: Cardiovascular;  Laterality: N/A;   HPI:  Pt is a 34 y.o. female with PMH of HTN who was admitted 09/23/19 2 weeks post partum after emergent C section due to rupture of membranes with VF arrest. Pt with enterobacter PNA and encephalopathy, trach placed 8/30 and PEG on 8/31. MRI 8/31: No evidence of anoxic brain injury.   Assessment / Plan / Recommendation Clinical Impression  Pt was seen for PMSV evaluation. She was alert throughout the evaluation but did not communicate verbally. Pt was educated regarding the anatomy of the larynx, the impact of the trach on voicing, and the goals of the session. She did not indicate any comprehension of this information and the extent of her understanding is questioned. [name] cuff was deflated upon SLP's entry. She presented with vitals of RR 21, SpO2 97, and HR 103 at baseline. She tolerated finger occlusion with minimal and inconsistent air trapping. She tolerated PMSV for 1 minute with no significant change in vital but her RR reduced to 18 and she demonstrated increased WOB. PMSV was removed and pt returned to baseline. A second trial was attempted, and vitals remained stable pt again demonstrated increased WOB after one minute. It is recommened that PMSV be used with SLP only and SLP will continue to  follow pt. SLP Visit Diagnosis: Aphonia (R49.1)    SLP Assessment  Patient needs continued Speech Lanaguage Pathology Services    Follow Up Recommendations  LTACH    Frequency and Duration min 2x/week  2 weeks    PMSV Trial PMSV was placed for: 1 minute Able to redirect subglottic air through upper airway: No Able to Attain Phonation: No attempt to phonate Able to Expectorate Secretions: Yes Level of Secretion Expectoration with PMSV: Tracheal Breath Support for Phonation:  (Not observed) Intelligibility: Not tested Respirations During Trial:  (18-24) SpO2 During Trial:  (97-98) Pulse During Trial:  (106-108) Behavior: Alert;No attempt to communicate   Tracheostomy Tube       Vent Dependency       Cuff Deflation Trial  Marzetta Lanza I. Vear Clock, MS, CCC-SLP Acute Rehabilitation Services Office number [phone] Pager [phone]  Tolerated Cuff Deflation:  (cuff deflated at baseline)        [name] 10/10/2019, 1:24 PM

## 2019-10-10 NOTE — Progress Notes (Signed)
ADVANCED HF PROGRESS  Patient Name: Beverly [name] Date of Encounter: 10/10/2019  [facility] Cardiologist: No primary care provider on file.   Subjective   Now s/p Trach & PEG  Awake on vent through trach.   More awake today but not following commands.   Inpatient Medications    Scheduled Meds: . aspirin  81 mg Per Tube Daily  . atorvastatin  80 mg Per Tube Daily  . carvedilol  6.25 mg Per Tube BID WC  . chlorhexidine gluconate (MEDLINE KIT)  15 mL Mouth Rinse BID  . Chlorhexidine Gluconate Cloth  6 each Topical Daily  . digoxin  0.125 mg Per Tube Daily  . docusate  100 mg Per Tube BID  . enoxaparin (LOVENOX) injection  40 mg Subcutaneous Q24H  . feeding supplement (PROSource TF)  90 mL Per Tube TID  . free water  200 mL Per Tube Q6H  . insulin aspart  0-15 Units Subcutaneous Q4H  . levETIRAcetam  500 mg Per Tube BID  . mouth rinse  15 mL Mouth Rinse 10 times per day  . modafinil  100 mg Per Tube Daily  . pantoprazole (PROTONIX) IV  40 mg Intravenous Q24H  . polyethylene glycol  17 g Per Tube Daily  . prenatal multivitamin  1 tablet Per Tube Q1200  . sacubitril-valsartan  1 tablet Per Tube BID  . scopolamine  1 patch Transdermal Q72H  . sodium chloride flush  10-40 mL Intracatheter Q12H  . spironolactone  25 mg Per Tube Daily   Continuous Infusions: . sodium chloride Stopped (10/10/19 0533)  . ceFEPime (MAXIPIME) IV 200 mL/hr at 10/10/19 0600  . feeding supplement (VITAL 1.5 CAL) 50 mL/hr at 10/10/19 0600  . vancomycin Stopped (10/09/19 2249)   PRN Meds: acetaminophen (TYLENOL) oral liquid 160 mg/5 mL, hydrALAZINE, midazolam, sodium chloride flush   Vital Signs    Vitals:   10/10/19 0437 10/10/19 0500 10/10/19 0600 10/10/19 0726  BP: 124/83 124/74 126/80   Pulse: 97 99 95   Resp: (!) 25 (!) 22 (!) 22   Temp:    97.7 F (36.5 C)  TempSrc:      SpO2:  100% 99%   Weight:      Height:        Intake/Output Summary (Last 24 hours) at 10/10/2019  0821 Last data filed at 10/10/2019 0600 Gross per 24 hour  Intake 1900.01 ml  Output 2350 ml  Net -449.99 ml   Last 3 Weights 10/10/2019 10/09/2019 10/08/2019  Weight (lbs) 212 lb 15.4 oz 211 lb 10.3 oz 210 lb 12.2 oz  Weight (kg) 96.6 kg 96 kg 95.6 kg  Some encounter information is confidential and restricted. Go to Review Flowsheets activity to see all data.       Telemetry   Sinus 90-100 Personally reviewed   Physical Exam   General: On vent through trach Awake but not following commands HEENT: normal Neck: supple. JVP up + trachCarotids 2+ bilat; no bruits. No lymphadenopathy or thryomegaly appreciated. Cor: PMI nondisplaced. Regular rate & rhythm. No rubs, gallops or murmurs. Lungs: clear Abdomen: + PEG soft, nontender, nondistended. No hepatosplenomegaly. No bruits or masses. Good bowel sounds. Extremities: no cyanosis, clubbing, rash, 1+ edema Neuro: awake but not following comamnds  Labs    High Sensitivity Troponin:   Recent Labs  Lab 09/23/19 1539 09/23/19 1816 09/24/19 0039 09/24/19 0826  TROPONINIHS 127* 2,078* 24,421* >27,000*      Chemistry Recent Labs  Lab 10/07/19 [phone]  10/08/19 0335 10/09/19 0346  NA 140 137 139  K 3.8 3.8 4.1  CL 103 100 104  CO2 _0 GLUCOSE 86 97 122*  BUN _1 CREATININE 0.45 0.52 0.52  CALCIUM 9.2 9.6 9.4  GFRNONAA >60 >60 >60  GFRAA >60 >60 >60  ANIONGAP _2 Hematology Recent Labs  Lab 10/08/19 0335 10/09/19 0346 10/10/19 0350  WBC 14.6* 13.5* 11.7*  RBC 3.64* 3.62* 3.47*  HGB 9.3* 9.2* 9.0*  HCT 30.9* 30.9* 30.1*  MCV 84.9 85.4 86.7  MCH 25.5* 25.4* 25.9*  MCHC 30.1 29.8* 29.9*  RDW 18.0* 17.9* 18.0*  PLT 654* 671* 665*    BNP No results for input(s): BNP, PROBNP in the last 168 hours.   DDimer No results for input(s): DDIMER in the last 168 hours.   Radiology    No results found.  Cardiac Studies   See section above (reviewed)  Patient Profile     34 y.o. female 2 weeks  post-partum with VF/torsades arrest in the setting of anterior MI  Assessment & Plan    1. VF arrest (in-hospital): in setting of anterior infarct. Rhythm has stabilized, off amiodarone, no significant arrhythmia on continuous telemetry. Continue beta-blockade.  - Now rewarmed and off sedation. But remains essentially unresponsive - EEG 8/20 diffuse slowing but improved - Brain MRI normal x 2. EEG still with diffuse slowing - Remains on vent through trach.  - VT quiescent. Keep K> 4.0 Mg > 2.0  2. Acute systolic heart failure: secondary to anterior MI, 09/24/19 LVEF 35%.   Volume up. Will give IV lasix x 1.  - On carvedilol 6.25 bid, Entresto 97-103bid, dig 0.125 and spiro 25 mg daily.  - Co-ox 78% 3. CAD/Anterior MI: angio reviewed again. Appears to have aneurysmal proximal LAD with dissection, likely culprit. Had TIMI-3 flow at cath but large infarct. Would anticipate relook catheterization once she recovers, likely medical therapy but will need to reassess.  - Continue ASA,  beta-blocker and statin.  - No evidence of ischemia currently 4. Encephalopathy, post-arrest.Likely anoxic brain injury  - more awake but will not follow commands - MRI 09/29/19 & 10/07/19 negative.  - EEG 8/31 EEG still with diffuse slowing 5. Hypokalemia/hypomag -Stable.  6. Hypertension -Stable. 7. Fevers - ? Neurogenic vs infectious - Continues with low grade fever overnight, mTemp 99.5 - Blx Cultures repeated 9/1 - Respiratory Cx  - UA - moderate leukocytes.  - On vanc cefepime 8. SVT on 10/03/19 - Resolved.   Needs LTAC. hopefully neuro status will imprve     Signed, Glori Bickers, MD  10/10/2019, 8:21 AM

## 2019-10-10 NOTE — Plan of Care (Signed)

## 2019-10-10 NOTE — Progress Notes (Signed)
Assisted tele visit to patient with mother.  [name], RN  

## 2019-10-11 LAB — COOXEMETRY PANEL
Carboxyhemoglobin: 1.4 % (ref 0.5–1.5)
Methemoglobin: 1.2 % (ref 0.0–1.5)
O2 Saturation: 67.7 %
Total hemoglobin: 9.8 g/dL — ABNORMAL LOW (ref 12.0–16.0)

## 2019-10-11 LAB — PHOSPHORUS: Phosphorus: 4.3 mg/dL (ref 2.5–4.6)

## 2019-10-11 LAB — CBC
HCT: 31.1 % — ABNORMAL LOW (ref 36.0–46.0)
Hemoglobin: 9.1 g/dL — ABNORMAL LOW (ref 12.0–15.0)
MCH: 25.1 pg — ABNORMAL LOW (ref 26.0–34.0)
MCHC: 29.3 g/dL — ABNORMAL LOW (ref 30.0–36.0)
MCV: 85.9 fL (ref 80.0–100.0)
Platelets: 643 10*3/uL — ABNORMAL HIGH (ref 150–400)
RBC: 3.62 MIL/uL — ABNORMAL LOW (ref 3.87–5.11)
RDW: 17.5 % — ABNORMAL HIGH (ref 11.5–15.5)
WBC: 10.3 10*3/uL (ref 4.0–10.5)
nRBC: 0 % (ref 0.0–0.2)

## 2019-10-11 LAB — BASIC METABOLIC PANEL
Anion gap: 9 (ref 5–15)
BUN: 19 mg/dL (ref 6–20)
CO2: 23 mmol/L (ref 22–32)
Calcium: 9.1 mg/dL (ref 8.9–10.3)
Chloride: 105 mmol/L (ref 98–111)
Creatinine, Ser: 0.47 mg/dL (ref 0.44–1.00)
GFR calc Af Amer: >60 mL/min (ref >60)
GFR calc non Af Amer: >60 mL/min (ref >60)
Glucose, Bld: 129 mg/dL — ABNORMAL HIGH (ref 70–99)
Potassium: 3.9 mmol/L (ref 3.5–5.1)
Sodium: 137 mmol/L (ref 135–145)

## 2019-10-11 LAB — GLUCOSE, CAPILLARY
Glucose-Capillary: 102 mg/dL — ABNORMAL HIGH (ref 70–99)
Glucose-Capillary: 111 mg/dL — ABNORMAL HIGH (ref 70–99)
Glucose-Capillary: 111 mg/dL — ABNORMAL HIGH (ref 70–99)
Glucose-Capillary: 114 mg/dL — ABNORMAL HIGH (ref 70–99)
Glucose-Capillary: 117 mg/dL — ABNORMAL HIGH (ref 70–99)
Glucose-Capillary: 121 mg/dL — ABNORMAL HIGH (ref 70–99)

## 2019-10-11 LAB — MAGNESIUM: Magnesium: 2.1 mg/dL (ref 1.7–2.4)

## 2019-10-11 MED FILL — Medication: Qty: 1 | Status: AC

## 2019-10-11 NOTE — Progress Notes (Signed)
Assisted with video call for family member 

## 2019-10-11 NOTE — Progress Notes (Signed)
NAME:  Beverly [name], MRN:  [id number], DOB:  Mar 23, 1985, LOS: 17 ADMISSION DATE:  09/24/2019, CONSULTATION DATE:  8/18 REFERRING MD:  Dr. Eldridge Dace, CHIEF COMPLAINT:  VF arrest   Brief History   34 year old female 2 weeks post partum presented with chest pain and suffered VF arrest.    History of present illness   Patient is encephalopathic and/or intubated. Therefore history has been obtained from chart review.  34 year old female with PMH as below, which is significant for HTN and 2 weeks post-partum. She had emergency C-section delivery due to premature rupture of membranes. She did have some gestational hypertension, but her pregnancy was otherwise uncomplicated. On 8/17 she developed dull chest pain shortly after eating, which prompted ED presentation at [facility]. Resolved spontaneously. No associated symptoms.   Past Medical History   has a past medical history of Hypertension and Vaginal Pap smear, abnormal.  Significant Hospital Events   8/17 chest pain, VF arrest; While in ED, she suffered VT > VF arrest with ROSC after defib x 3 and epi x 1. Immediately after arrest ST elevation was noted on EKG, which did improve on repeat. Differential ACS, SCAD, and toresades/metabolic. Electrolytes were replaced. She was taken to the cath lab at [facility]  Where aneurysmal dilation of the ostial and proximal LAD and was transferred to [facility] for ICU admission.  8/23 possibly febrile. Pan-cultures sent. Still on full support. Gets hypertensive when sedation let up. abx started later in day when temp >101 8/24 opens eyes to stimulus. + strong cough. Tolerating PSV but mental status still not supporting extubation  8/25 about the same  8/27: Seems to be moving a little bit more but mostly posturing.  Opening eyes more.  Had a run of SVT. Consults:    Procedures:  8/18> Central Line, removed 8/26 PICC line placed 8/30 trach 8/31 PEG  Significant Diagnostic Tests:  LHC 8/17 > No  coronary artery occlusion or critical stenosis. There is aneurysmal dilation of the ostial and proximal LAD with possible ulceration or dissection of uncertain chronicity.  TIMI-3 flow is noted throughout the LAD and its branches. Severely reduced left ventricular systolic function with mid/apical anterior, apical, and apical inferior hypokinesis/akinesis. LV gram 30% EF.  CT head 8/17 > normal  CTA chest 8/17 > no evidence of PE, moderate severity infiltrates vs ATX, small bilateral pleural effusions. Very small pericaradial effusion.  CT abdomen/pelvis 8/17 > Enlarged, heterogeneous uterus, Small amount of fluid seen in between the right kidney and right lobe of the liver. Echo 8/18> LVEF 30-35%, septal and apical akinesis distal anterior wall an d mid/distal inferior wall hypokinesis hyperdynamic basal function. LV size mildly dilated.  MRI brain 8/23: Normal  Micro Data:  See micro tab  Latest: Blood 9/1 >> ngtd Urine 9/1 >> Sputum 9/1 >>  Antimicrobials:  See fever tab  Currently Vanc 9/2>> Cefepime 9/2>>  Interim history/subjective:  No events. Low grade fevers, started on HCAP coverage. Has been on trach collar for over 48h.   Objective   Blood pressure 118/71, pulse 85, temperature 98.8 F (37.1 C), temperature source Oral, resp. rate 20, height 5\' 10"  (1.778 m), weight 96.3 kg, SpO2 98 %, unknown if currently breastfeeding.    FiO2 (%):  [28 %] 28 %   Intake/Output Summary (Last 24 hours) at 10/11/2019 1622 Last data filed at 10/11/2019 1200 Gross per 24 hour  Intake 1289.93 ml  Output 1000 ml  Net 289.93 ml  Filed Weights   10/09/19 0342 10/10/19 0339 10/11/19 0443  Weight: 96 kg 96.6 kg 96.3 kg    Examination: GEN: no acute distress HEENT: trach in place, copious oral secretions, thick mucoid secretions CV: RRR, ext warm PULM: less tachypneic, +rhonci GI: Soft, +BS, PEG in place EXT: No edema NEURO: moving eyes occasionally tracking, more awake  today PSYCH: cannot assess SKIN: no rashes  Ammonia normal Cr good, mag repleted WBC improved Borderline fevers  Resolved Hospital Problem list    Assessment & Plan:   VF Arrest New Onset Post Partum Cardiomyopathy, EF 35% HFrEF New SVT on 8/27 -Appreciate cardiology management.  Continue carvedilol, Entresto, spironolactone, BiDil, digoxin, aspirin -Follow BMP, replace electrolytes as indicated  Acute metabolic encephalopathy post cardiac arrest Currently MRI negative x 2, EEG negative for seizures; now off sedation.  Plan is to continue maximal level of care in hopes for recovery. - s/p trach/PEG - Keppra to PO, end date 7 days - Avoid fevers - Trial of modafenil  Acute Hypoxemic Respiratory Failure 2/2 to Cardiac Arrest, Pulmonary Edema further complicated by Enterobacter and MSSA pneumonia (S) to cefepime.  8/30 trach - Indefinite TC trial - Trach sutures out 9/4 - Ready to transfer to PCU  Best practice:  Diet: TF Pain/Anxiety/Delirium protocol (if indicated): not needed VAP protocol (if indicated): yes DVT prophylaxis: lovenox GI prophylaxis: PPI Glucose control: SSI Mobility: BR Code Status: FULL  Family Communication: updated at bedside Disposition: stable to start looking for LTACH placement   The patient is critically ill with multiple organ systems failure and requires high complexity decision making for assessment and support, frequent evaluation and titration of therapies, application of advanced monitoring technologies and extensive interpretation of multiple databases. Critical Care Time devoted to patient care services described in this note independent of APP/resident time (if applicable)  is 32 minutes.   Myrla Halsted MD [facility] 10/11/2019 4:22 PM Personal pager: [phone] If unanswered, please page CCM On-call: #[phone]

## 2019-10-11 NOTE — Progress Notes (Signed)
ADVANCED HF PROGRESS  Patient Name: Beverly [name] Date of Encounter: 10/11/2019  [facility] Cardiologist: No primary care provider on file.   Subjective   Now s/p Trach & PEG  Awake on vent through trach. Put still not following commands.   Given lasix yesterday. Weight up a pound.  Inpatient Medications    Scheduled Meds: . aspirin  81 mg Per Tube Daily  . atorvastatin  80 mg Per Tube Daily  . carvedilol  6.25 mg Per Tube BID WC  . chlorhexidine gluconate (MEDLINE KIT)  15 mL Mouth Rinse BID  . Chlorhexidine Gluconate Cloth  6 each Topical Daily  . digoxin  0.125 mg Per Tube Daily  . docusate  100 mg Per Tube BID  . enoxaparin (LOVENOX) injection  40 mg Subcutaneous Q24H  . feeding supplement (PROSource TF)  90 mL Per Tube TID  . free water  200 mL Per Tube Q6H  . insulin aspart  0-15 Units Subcutaneous Q4H  . levETIRAcetam  500 mg Per Tube BID  . mouth rinse  15 mL Mouth Rinse 10 times per day  . pantoprazole (PROTONIX) IV  40 mg Intravenous Q24H  . polyethylene glycol  17 g Per Tube Daily  . prenatal multivitamin  1 tablet Per Tube Q1200  . sacubitril-valsartan  1 tablet Per Tube BID  . scopolamine  1 patch Transdermal Q72H  . sodium chloride flush  10-40 mL Intracatheter Q12H  . spironolactone  25 mg Per Tube Daily   Continuous Infusions: . sodium chloride 10 mL/hr at 10/11/19 0100  . ceFEPime (MAXIPIME) IV Stopped (10/10/19 2247)  . feeding supplement (VITAL 1.5 CAL) 1,000 mL (10/10/19 1557)   PRN Meds: acetaminophen (TYLENOL) oral liquid 160 mg/5 mL, hydrALAZINE, midazolam, sodium chloride flush   Vital Signs    Vitals:   10/10/19 2300 10/11/19 0000 10/11/19 0100 10/11/19 0400  BP: 107/67 115/70 111/75   Pulse: 96 (!) 103 94   Resp: 20 19 20    Temp:    98.1 F (36.7 C)  TempSrc:    Oral  SpO2: 99% 100% 99%   Weight:      Height:        Intake/Output Summary (Last 24 hours) at 10/11/2019 0433 Last data filed at 10/11/2019 0100 Gross per 24  hour  Intake 1935.12 ml  Output 2900 ml  Net -964.88 ml   Last 3 Weights 10/10/2019 10/09/2019 10/08/2019  Weight (lbs) 212 lb 15.4 oz 211 lb 10.3 oz 210 lb 12.2 oz  Weight (kg) 96.6 kg 96 kg 95.6 kg  Some encounter information is confidential and restricted. Go to Review Flowsheets activity to see all data.       Telemetry   Sinus 90s Personally reviewed   Physical Exam   General: On vent through trach Awake but not following commands HEENT: normal Neck: supple. + trach. Carotids 2+ bilat; no bruits. No lymphadenopathy or thryomegaly appreciated. Cor: PMI nondisplaced. Regular rate & rhythm. No rubs, gallops or murmurs. Lungs: clear Abdomen: obese  + PEG soft, nontender, nondistended. No hepatosplenomegaly. No bruits or masses. Good bowel sounds. Extremities: no cyanosis, clubbing, rash, tr-1+ edema Neuro: awake but not following commands   Labs    High Sensitivity Troponin:   Recent Labs  Lab 09/23/19 1539 09/23/19 1816 09/24/19 0039 09/24/19 0826  TROPONINIHS 127* 2,078* 24,421* >27,000*      Chemistry Recent Labs  Lab 10/08/19 0335 10/09/19 0346 10/10/19 0350  NA 137 139 138  K 3.8  4.1 4.1  CL 100 104 106  CO2 25 25 24   GLUCOSE 97 122* 117*  BUN 13 13 13   CREATININE 0.52 0.52 0.41*  CALCIUM 9.6 9.4 9.5  GFRNONAA >60 >60 >60  GFRAA >60 >60 >60  ANIONGAP 12 10 8      Hematology Recent Labs  Lab 10/08/19 0335 10/09/19 0346 10/10/19 0350  WBC 14.6* 13.5* 11.7*  RBC 3.64* 3.62* 3.47*  HGB 9.3* 9.2* 9.0*  HCT 30.9* 30.9* 30.1*  MCV 84.9 85.4 86.7  MCH 25.5* 25.4* 25.9*  MCHC 30.1 29.8* 29.9*  RDW 18.0* 17.9* 18.0*  PLT 654* 671* 665*    BNP No results for input(s): BNP, PROBNP in the last 168 hours.   DDimer No results for input(s): DDIMER in the last 168 hours.   Radiology    No results found.  Cardiac Studies   See section above (reviewed)  Patient Profile     34 y.o. female 2 weeks post-partum with VF/torsades arrest in the setting  of anterior MI  Assessment & Plan    1. VF arrest (in-hospital): in setting of anterior infarct. Rhythm has stabilized, off amiodarone, no significant arrhythmia on continuous telemetry. Continue beta-blockade.  - Now rewarmed and off sedation. But remains essentially unresponsive - EEG 8/20 diffuse slowing but improved - Brain MRI normal x 2. EEG still with diffuse slowing - Remains on vent through trach.  - VT quiescent. Keep K> 4.0 Mg > 2.0  2. Acute systolic heart failure: secondary to anterior MI, 09/24/19 LVEF 35%.   Volume up.  - On carvedilol 6.25 bid, Entresto 97-103bid, dig 0.125 and spiro 25 mg daily.  - Co-ox 77% - Volume up slightly. Will stop free water 3. CAD/Anterior MI: angio reviewed again. Appears to have aneurysmal proximal LAD with dissection, likely culprit. Had TIMI-3 flow at cath but large infarct. Would anticipate relook catheterization if/whenshe recovers - Continue ASA,  beta-blocker and statin.  - No evidence ischemia currently - Continue medical therapy for now 4. Encephalopathy, post-arrest.Likely anoxic brain injury  - awake but not following commands. Modofanil stopped.  - MRI 09/29/19 & 10/07/19 negative.  - EEG 8/31 EEG still with diffuse slowing 5. Hypokalemia/hypomag -Stable.  6. Hypertension -Stable. 7. Fevers - ? Neurogenic vs infectious - Continues with low grade fever overnight, mTemp 100.6 - Blx Cultures repeated 9/1 NGTD. UCx negative - Respiratory Cx = few Staph aureus, rare enterobacter aerogenes - On vanc cefepime 8. SVT on 10/03/19 - Resolved.   Await LTAC placement   CRITICAL CARE Performed by: Glori Bickers  Total critical care time: 35 minutes  Critical care time was exclusive of separately billable procedures and treating other patients.  Critical care was necessary to treat or prevent imminent or life-threatening deterioration.  Critical care was time spent personally by me (independent of midlevel providers or  residents) on the following activities: development of treatment plan with patient and/or surrogate as well as nursing, discussions with consultants, evaluation of patient's response to treatment, examination of patient, obtaining history from patient or surrogate, ordering and performing treatments and interventions, ordering and review of laboratory studies, ordering and review of radiographic studies, pulse oximetry and re-evaluation of patient's condition.      Signed, Glori Bickers, MD  10/11/2019, 4:33 AM

## 2019-10-12 LAB — GLUCOSE, CAPILLARY
Glucose-Capillary: 100 mg/dL — ABNORMAL HIGH (ref 70–99)
Glucose-Capillary: 108 mg/dL — ABNORMAL HIGH (ref 70–99)
Glucose-Capillary: 112 mg/dL — ABNORMAL HIGH (ref 70–99)
Glucose-Capillary: 118 mg/dL — ABNORMAL HIGH (ref 70–99)
Glucose-Capillary: 118 mg/dL — ABNORMAL HIGH (ref 70–99)
Glucose-Capillary: 89 mg/dL (ref 70–99)

## 2019-10-12 LAB — BASIC METABOLIC PANEL
Anion gap: 8 (ref 5–15)
BUN: 19 mg/dL (ref 6–20)
CO2: 26 mmol/L (ref 22–32)
Calcium: 9.4 mg/dL (ref 8.9–10.3)
Chloride: 105 mmol/L (ref 98–111)
Creatinine, Ser: 0.43 mg/dL — ABNORMAL LOW (ref 0.44–1.00)
GFR calc Af Amer: >60 mL/min (ref >60)
GFR calc non Af Amer: >60 mL/min (ref >60)
Glucose, Bld: 116 mg/dL — ABNORMAL HIGH (ref 70–99)
Potassium: 4.3 mmol/L (ref 3.5–5.1)
Sodium: 139 mmol/L (ref 135–145)

## 2019-10-12 LAB — CBC
HCT: 30.4 % — ABNORMAL LOW (ref 36.0–46.0)
Hemoglobin: 9 g/dL — ABNORMAL LOW (ref 12.0–15.0)
MCH: 25.1 pg — ABNORMAL LOW (ref 26.0–34.0)
MCHC: 29.6 g/dL — ABNORMAL LOW (ref 30.0–36.0)
MCV: 84.9 fL (ref 80.0–100.0)
Platelets: 722 10*3/uL — ABNORMAL HIGH (ref 150–400)
RBC: 3.58 MIL/uL — ABNORMAL LOW (ref 3.87–5.11)
RDW: 17.2 % — ABNORMAL HIGH (ref 11.5–15.5)
WBC: 8.9 10*3/uL (ref 4.0–10.5)
nRBC: 0 % (ref 0.0–0.2)

## 2019-10-12 LAB — BRAIN NATRIURETIC PEPTIDE: B Natriuretic Peptide: 172.2 pg/mL — ABNORMAL HIGH (ref 0.0–100.0)

## 2019-10-12 LAB — MAGNESIUM: Magnesium: 1.8 mg/dL (ref 1.7–2.4)

## 2019-10-12 NOTE — Progress Notes (Signed)
@  2158 paged Dr. Carren Rang, on-call for attending, regarding pt's 14 beat run of V-Tach @2105 . Page promptly returned and no new orders received. Will continue to monitor.

## 2019-10-12 NOTE — Progress Notes (Signed)
Pharmacy Antibiotic Note  Beverly [name] is a 34 y.o. female admitted on 09/24/2019 s/p cardiac arrest.  Recently completed 7-day course of cefepime for enterobacter in sputum culture. Now with MSSA and enterobacter growing in most recent sputum culture. Pharmacy has been consulted for cefepime dosing.   Patient is afebrile, WBC, WNL. Renal function stable making good urine output.  Plan: Cefepime 2g IV Q8 hrs Monitor renal function, cultures/sensitivities, clinical progression, LOT  Height: 5\' 10"  (177.8 cm) Weight: 96.3 kg (212 lb 4.9 oz) IBW/kg (Calculated) : 68.5  Temp (24hrs), Avg:98.9 F (37.2 C), Min:97.8 F (36.6 C), Max:99.8 F (37.7 C)  Recent Labs  Lab 10/08/19 0335 10/09/19 0346 10/10/19 0350 10/11/19 0440 10/12/19 0656  WBC 14.6* 13.5* 11.7* 10.3 8.9  CREATININE 0.52 0.52 0.41* 0.47 0.43*    Estimated Creatinine Clearance: 126.9 mL/min (A) (by C-G formula based on SCr of 0.43 mg/dL (L)).    No Known Allergies  Antimicrobials this admission: Azithro 8/18 >> 8/18 Ceftriaxone 8/18 >> 8/23  Cefepime 8/23 >> 8/30; 9/2>> Vanc 8/23 >>8/25; 9/2>>9/3  Dose adjustments this admission: N/A  Microbiology results: 9/1 TA: few MSSA/rare enterobacter (S cefepime) 9/1 Ucx: neg 9/1 Bcx: ngtd 8/23 UA: neg 8/23 Sputum Cx: enterobacter (S cefepime) 8/23 Ucx: ngtd final 8/23 Bcx: neg 8/18 BCx: neg  9/18, PharmD PGY1 Acute Care Pharmacy Resident Phone: [phone] 10/12/2019 1:43 PM  Please check [email or web address] for unit specific pharmacy phone numbers.

## 2019-10-12 NOTE — Progress Notes (Signed)
PROGRESS NOTE    Beverly [name]   MRN:[id number]  DOB: 12/07/1985  DOA: 09/24/2019 PCP: Patient, No Pcp Per   Brief Narrative:  Beverly [name] 8/17 chest pain, VF arrest; While in ED, she suffered VT > VF arrest with ROSC after defib x 3 and epi x 1. Immediately after arrest ST elevation was noted on EKG, which did improve on repeat. Differential ACS, SCAD, and toresades/metabolic. Electrolytes were replaced. She was taken to the cath lab at [facility]  Where aneurysmal dilation of the ostial and proximal LAD and was transferred to [facility] for ICU admission. 8/23 possibly febrile. Pan-cultures sent. Still on full support. Gets hypertensive when sedation let up. abx started later in day when temp >101 8/24 opens eyes to stimulus. + strong cough. Tolerating PSV but mental status still not supporting extubation  8/25 about the same  8/27: Seems to be moving a little bit more but mostly posturing.  Opening eyes more.  Had a run of SVT.   Subjective: Encephalpathic. Non verbal.     Assessment & Plan:   Active Problems:   Cardiac arrest  - now is s/p trach and Peg - continue current care  Acute metabolic encephalopathy - Mostly unresponsive  Diarrhea - hold laxatives    Acute combined systolic and diastolic heart failure  - euvolemic- follow      Time spent in minutes: 35 DVT prophylaxis: Full code Code Status: Full code Family Communication:  Disposition Plan:  Status is: Inpatient  Remains inpatient appropriate because:remains encephalopathic   Dispo: The patient is from: Home              Anticipated d/c is to: SNF              Anticipated d/c date is: > 3 days              Patient currently is not medically stable to d/c.      Consultants:   PCCM  Cardiology    Antimicrobials:  Anti-infectives (From admission, onward)   Start     Dose/Rate Route Frequency Ordered Stop   10/09/19 2100  vancomycin (VANCOREADY) IVPB 1500 mg/300 mL  Status:   Discontinued        1,500 mg 150 mL/hr over 120 Minutes Intravenous Every 12 hours 10/09/19 1237 10/10/19 1121   10/09/19 0800  ceFEPIme (MAXIPIME) 2 g in sodium chloride 0.9 % 100 mL IVPB        2 g 200 mL/hr over 30 Minutes Intravenous Every 8 hours 10/09/19 0752 10/16/19 0559   10/09/19 0800  vancomycin (VANCOREADY) IVPB 2000 mg/400 mL        2,000 mg 200 mL/hr over 120 Minutes Intravenous  Once 10/09/19 0752 10/09/19 1147   10/07/19 1540  ceFAZolin (ANCEF) 2-4 GM/100ML-% IVPB       Note to Pharmacy: Sykes, Ashley   : cabinet override      10/07/19 1540 10/08/19 0344   10/07/19 1400  ceFAZolin (ANCEF) IVPB 2g/100 mL premix        2 g 200 mL/hr over 30 Minutes Intravenous To Radiology 10/06/19 1339 10/07/19 1627   09/30/19 0300  vancomycin (VANCOCIN) IVPB 1000 mg/200 mL premix  Status:  Discontinued        1,000 mg 200 mL/hr over 60 Minutes Intravenous Every 8 hours 09/29/19 1809 10/01/19 0853   09/29/19 1815  ceFEPIme (MAXIPIME) 2 g in sodium chloride 0.9 % 100 mL IVPB          2 g 200 mL/hr over 30 Minutes Intravenous Every 8 hours 09/29/19 1803 10/05/19 1801   09/29/19 1815  vancomycin (VANCOREADY) IVPB 2000 mg/400 mL        2,000 mg 200 mL/hr over 120 Minutes Intravenous  Once 09/29/19 1803 09/29/19 2137   09/24/19 2200  cefTRIAXone (ROCEPHIN) 2 g in sodium chloride 0.9 % 100 mL IVPB        2 g 200 mL/hr over 30 Minutes Intravenous Daily at bedtime 09/24/19 1201 09/28/19 2338   09/24/19 0330  cefTRIAXone (ROCEPHIN) 1 g in sodium chloride 0.9 % 100 mL IVPB  Status:  Discontinued        1 g 200 mL/hr over 30 Minutes Intravenous Daily at bedtime 09/24/19 0245 09/24/19 1201   09/24/19 0300  azithromycin (ZITHROMAX) 500 mg in sodium chloride 0.9 % 250 mL IVPB  Status:  Discontinued        500 mg 250 mL/hr over 60 Minutes Intravenous Daily at bedtime 09/24/19 0245 09/24/19 1103       Objective: Vitals:   10/12/19 1129 10/12/19 1137 10/12/19 1142 10/12/19 1527  BP:  117/81 137/86    Pulse: 89 87 86 88  Resp: 20 (!) _0 Temp:  99.8 F (37.7 C) 99.8 F (37.7 C)   TempSrc:  Axillary Axillary   SpO2: 98%  100% 99%  Weight:      Height:        Intake/Output Summary (Last 24 hours) at 10/12/2019 1627 Last data filed at 10/12/2019 0300 Gross per 24 hour  Intake 9.99 ml  Output --  Net 9.99 ml   Filed Weights   10/09/19 0342 10/10/19 0339 10/11/19 0443  Weight: 96 kg 96.6 kg 96.3 kg    Examination: General exam: Appears comfortable  HEENT: PERRLA, oral mucosa moist, no sclera icterus or thrush- clear yellowish secretions from trach Respiratory system: rhonchi. Respiratory effort normal. Cardiovascular system: S1 & S2 heard, RRR.   Gastrointestinal system: Abdomen soft, non-tender, nondistended. Normal bowel sounds. Central nervous system: opens eyes, does not follow commands or attempt to communicate Extremities: No cyanosis, clubbing or edema Skin: No rashes or ulcers      Data Reviewed: I have personally reviewed following labs and imaging studies  CBC: Recent Labs  Lab 10/08/19 0335 10/09/19 0346 10/10/19 0350 10/11/19 0440 10/12/19 0656  WBC 14.6* 13.5* 11.7* 10.3 8.9  HGB 9.3* 9.2* 9.0* 9.1* 9.0*  HCT 30.9* 30.9* 30.1* 31.1* 30.4*  MCV 84.9 85.4 86.7 85.9 84.9  PLT 654* 671* 665* 643* 563*   Basic Metabolic Panel: Recent Labs  Lab 10/07/19 0353 10/07/19 0353 10/08/19 0335 10/09/19 0346 10/10/19 0350 10/11/19 0440 10/12/19 0656  NA 140   < > 137 139 138 137 139  K 3.8   < > 3.8 4.1 4.1 3.9 4.3  CL 103   < > 100 104 106 105 105  CO2 25   < > _1 GLUCOSE 86   < > 97 122* 117* 129* 116*  BUN 13   < > _2 CREATININE 0.45   < > 0.52 0.52 0.41* 0.47 0.43*  CALCIUM 9.2   < > 9.6 9.4 9.5 9.1 9.4  MG 1.9   < > 1.8 2.1 1.8 2.1 1.8  PHOS 4.6  --  4.7* 4.0 4.7* 4.3  --    < > = values in this interval not displayed.   GFR: Estimated Creatinine Clearance: 126.9 mL/min (  A) (by C-G formula based on SCr of 0.43  mg/dL (L)). Liver Function Tests: No results for input(s): AST, ALT, ALKPHOS, BILITOT, PROT, ALBUMIN in the last 168 hours. No results for input(s): LIPASE, AMYLASE in the last 168 hours. Recent Labs  Lab 10/07/19 1136  AMMONIA 15   Coagulation Profile: No results for input(s): INR, PROTIME in the last 168 hours. Cardiac Enzymes: No results for input(s): CKTOTAL, CKMB, CKMBINDEX, TROPONINI in the last 168 hours. BNP (last 3 results) No results for input(s): PROBNP in the last 8760 hours. HbA1C: No results for input(s): HGBA1C in the last 72 hours. CBG: Recent Labs  Lab 10/11/19 2121 10/12/19 0030 10/12/19 0402 10/12/19 0840 10/12/19 1134  GLUCAP 111* 108* 118* 100* 112*   Lipid Profile: No results for input(s): CHOL, HDL, LDLCALC, TRIG, CHOLHDL, LDLDIRECT in the last 72 hours. Thyroid Function Tests: No results for input(s): TSH, T4TOTAL, FREET4, T3FREE, THYROIDAB in the last 72 hours. Anemia Panel: No results for input(s): VITAMINB12, FOLATE, FERRITIN, TIBC, IRON, RETICCTPCT in the last 72 hours. Urine analysis:    Component Value Date/Time   COLORURINE YELLOW 10/08/2019 1316   APPEARANCEUR HAZY (A) 10/08/2019 1316   APPEARANCEUR Clear 03/26/2015 1458   LABSPEC 1.031 (H) 10/08/2019 1316   PHURINE 5.0 10/08/2019 1316   GLUCOSEU NEGATIVE 10/08/2019 1316   HGBUR MODERATE (A) 10/08/2019 1316   BILIRUBINUR NEGATIVE 10/08/2019 1316   BILIRUBINUR neg 10/12/2015 1709   BILIRUBINUR Negative 03/26/2015 [place] 10/08/2019 1316   PROTEINUR 30 (A) 10/08/2019 1316   UROBILINOGEN negative 10/12/2015 1709   NITRITE NEGATIVE 10/08/2019 1316   LEUKOCYTESUR MODERATE (A) 10/08/2019 1316   Sepsis Labs: _0 (procalcitonin:4,lacticidven:4) ) Recent Results (from the past 240 hour(s))  Culture, blood (routine x 2)     Status: None (Preliminary result)   Collection Time: 10/08/19  9:01 AM   Specimen: BLOOD RIGHT HAND  Result Value Ref Range Status    Specimen Description BLOOD RIGHT HAND  Final   Special Requests   Final    BOTTLES DRAWN AEROBIC ONLY Blood Culture adequate volume   Culture   Final    NO GROWTH 4 DAYS Performed at [facility], [place] [address] [place], [place] [address]    Report Status PENDING  Incomplete  Culture, blood (routine x 2)     Status: None (Preliminary result)   Collection Time: 10/08/19  9:03 AM   Specimen: BLOOD RIGHT HAND  Result Value Ref Range Status   Specimen Description BLOOD RIGHT HAND  Final   Special Requests   Final    BOTTLES DRAWN AEROBIC ONLY Blood Culture adequate volume   Culture   Final    NO GROWTH 4 DAYS Performed at [facility], [place] [address] [place], [place] [address]    Report Status PENDING  Incomplete  Culture, respiratory (non-expectorated)     Status: None   Collection Time: 10/08/19 10:55 AM   Specimen: Tracheal Aspirate; Respiratory  Result Value Ref Range Status   Specimen Description TRACHEAL ASPIRATE  Final   Special Requests NONE  Final   Gram Stain   Final    FEW WBC PRESENT, PREDOMINANTLY PMN RARE GRAM NEGATIVE RODS RARE GRAM POSITIVE COCCI IN CHAINS Performed at [facility], [place] [address], [place], [place] [address]    Culture   Final    FEW STAPHYLOCOCCUS AUREUS RARE ENTEROBACTER AEROGENES    Report Status 10/10/2019 FINAL  Final   Organism ID, Bacteria STAPHYLOCOCCUS  AUREUS  Final   Organism ID, Bacteria ENTEROBACTER AEROGENES  Final      Susceptibility   Enterobacter aerogenes - MIC*    CEFAZOLIN >=64 RESISTANT Resistant     CEFEPIME <=0.12 SENSITIVE Sensitive     CEFTAZIDIME 4 SENSITIVE Sensitive     CEFTRIAXONE 2 INTERMEDIATE Intermediate     CIPROFLOXACIN <=0.25 SENSITIVE Sensitive     GENTAMICIN <=1 SENSITIVE Sensitive     IMIPENEM 1 SENSITIVE Sensitive     TRIMETH/SULFA <=20 SENSITIVE Sensitive     PIP/TAZO <=4 SENSITIVE Sensitive     * RARE ENTEROBACTER AEROGENES   Staphylococcus aureus - MIC*    CIPROFLOXACIN <=0.5  SENSITIVE Sensitive     ERYTHROMYCIN RESISTANT Resistant     GENTAMICIN <=0.5 SENSITIVE Sensitive     OXACILLIN <=0.25 SENSITIVE Sensitive     TETRACYCLINE <=1 SENSITIVE Sensitive     VANCOMYCIN 1 SENSITIVE Sensitive     TRIMETH/SULFA <=10 SENSITIVE Sensitive     CLINDAMYCIN RESISTANT Resistant     RIFAMPIN <=0.5 SENSITIVE Sensitive     Inducible Clindamycin POSITIVE Resistant     * FEW STAPHYLOCOCCUS AUREUS  Culture, Urine     Status: None   Collection Time: 10/08/19  1:16 PM   Specimen: Urine, Random  Result Value Ref Range Status   Specimen Description URINE, RANDOM  Final   Special Requests NONE  Final   Culture   Final    NO GROWTH Performed at [facility], [address] [place],  [address]    Report Status 10/09/2019 FINAL  Final         Radiology Studies: No results found.    Scheduled Meds: . aspirin  81 mg Per Tube Daily  . atorvastatin  80 mg Per Tube Daily  . carvedilol  6.25 mg Per Tube BID WC  . chlorhexidine gluconate (MEDLINE KIT)  15 mL Mouth Rinse BID  . Chlorhexidine Gluconate Cloth  6 each Topical Daily  . digoxin  0.125 mg Per Tube Daily  . enoxaparin (LOVENOX) injection  40 mg Subcutaneous Q24H  . feeding supplement (PROSource TF)  90 mL Per Tube TID  . insulin aspart  0-15 Units Subcutaneous Q4H  . levETIRAcetam  500 mg Per Tube BID  . mouth rinse  15 mL Mouth Rinse 10 times per day  . prenatal multivitamin  1 tablet Per Tube Q1200  . sacubitril-valsartan  1 tablet Per Tube BID  . scopolamine  1 patch Transdermal Q72H  . sodium chloride flush  10-40 mL Intracatheter Q12H  . spironolactone  25 mg Per Tube Daily   Continuous Infusions: . sodium chloride 10 mL/hr at 10/12/19 0156  . ceFEPime (MAXIPIME) IV 2 g (10/12/19 1344)  . feeding supplement (VITAL 1.5 CAL) 1,000 mL (10/12/19 1342)     LOS: 18 days      Saima Rizwan, MD [facility] Pager: [email or web address] 10/12/2019, 4:27 PM  

## 2019-10-13 LAB — BASIC METABOLIC PANEL
Anion gap: 10 (ref 5–15)
BUN: 17 mg/dL (ref 6–20)
CO2: 24 mmol/L (ref 22–32)
Calcium: 9.5 mg/dL (ref 8.9–10.3)
Chloride: 102 mmol/L (ref 98–111)
Creatinine, Ser: 0.41 mg/dL — ABNORMAL LOW (ref 0.44–1.00)
GFR calc Af Amer: >60 mL/min (ref >60)
GFR calc non Af Amer: >60 mL/min (ref >60)
Glucose, Bld: 104 mg/dL — ABNORMAL HIGH (ref 70–99)
Potassium: 4.1 mmol/L (ref 3.5–5.1)
Sodium: 136 mmol/L (ref 135–145)

## 2019-10-13 LAB — GLUCOSE, CAPILLARY
Glucose-Capillary: 102 mg/dL — ABNORMAL HIGH (ref 70–99)
Glucose-Capillary: 106 mg/dL — ABNORMAL HIGH (ref 70–99)
Glucose-Capillary: 111 mg/dL — ABNORMAL HIGH (ref 70–99)
Glucose-Capillary: 111 mg/dL — ABNORMAL HIGH (ref 70–99)
Glucose-Capillary: 112 mg/dL — ABNORMAL HIGH (ref 70–99)
Glucose-Capillary: 98 mg/dL (ref 70–99)

## 2019-10-13 LAB — CULTURE, BLOOD (ROUTINE X 2)
Culture: NO GROWTH
Culture: NO GROWTH
Special Requests: ADEQUATE
Special Requests: ADEQUATE

## 2019-10-13 LAB — MAGNESIUM: Magnesium: 1.9 mg/dL (ref 1.7–2.4)

## 2019-10-13 LAB — BRAIN NATRIURETIC PEPTIDE: B Natriuretic Peptide: 186.1 pg/mL — ABNORMAL HIGH (ref 0.0–100.0)

## 2019-10-13 NOTE — Progress Notes (Signed)
Physical Therapy Treatment Patient Details Name: Beverly [name] MRN: [id number] DOB: 1985/08/09 Today's Date: 10/13/2019    History of Present Illness Pt is 34 yo admitted 09/23/19 2 weeks post partum after emergent C section due to rupture of membranes with VF arrest. Pt with enterobacter PNA, trach 8/30, PEG, encephalopathy. PMHx: HTN    PT Comments    Pt with limited progress.  She is on trach collar today with VSS and did move eyes to midline in sitting, but still not tracking or fixated with vision.  Continues to required total A for all transfers.  Pt spontaneously moves extremities, but not on commands.  Provided PROM and educated family of PROM.  Cont POC>   Follow Up Recommendations  LTACH     Equipment Recommendations  Other (comment) (further assessment next venue)    Recommendations for Other Services       Precautions / Restrictions Precautions Precautions: Fall Precaution Comments: flexi, bil prevalon, trach, PEG    Mobility  Bed Mobility Overal bed mobility: Needs Assistance Bed Mobility: Supine to Sit;Sit to Supine;Rolling Rolling: Total assist;+2 for physical assistance   Supine to sit: Total assist;+2 for physical assistance Sit to supine: Total assist;+2 for physical assistance   General bed mobility comments: Total x 2 for all with positioning of extremities and use of bed pad to facilitate.  Transfers                 General transfer comment: not appropriate to attempt   Ambulation/Gait                 Stairs             Wheelchair Mobility    Modified Rankin (Stroke Patients Only)       Balance Overall balance assessment: Needs assistance   Sitting balance-Leahy Scale: Zero Sitting balance - Comments: Pt without trunk engagement.  Tended to keep head flexed but did minimally assist when therapist held head up. Required total A for balance.  WOrked on [employer] shifting and weighbearing through bil elbows with support. Sat  EOB for 5-7 minutes.                                    Cognition Arousal/Alertness: Lethargic Behavior During Therapy: Flat affect Overall Cognitive Status: Impaired/Different from baseline Area of Impairment: Rancho level               Rancho Levels of Cognitive Functioning Ranch[place] Amigos Scales of Cognitive Functioning: Generalized response               General Comments: pt with eye opening in sitting without visual fixation or tracking- tends to gaze L but did move eyes midline when sitting      Exercises General Exercises - Lower Extremity Ankle Circles/Pumps: PROM;Both;10 reps;Supine Heel Slides: PROM;Both;10 reps;Supine Hip ABduction/ADduction: PROM;Both;10 reps;Supine Other Exercises Other Exercises: 1x10 PROM bil Shoulder flex/ext, elbow flex/ext, scapular rotation    General Comments General comments (skin integrity, edema, etc.): MOther present - discuessed Role of PT and purpose of movemnts.  Encouraged family to perform PROM as albe to maintain ROM and prevent contractures      Pertinent Vitals/Pain Pain Assessment: No/denies pain    Home Living                      Prior Function  PT Goals (current goals can now be found in the care plan section) Acute Rehab PT Goals Patient Stated Goal: return home, care for her kids PT Goal Formulation: With family Time For Goal Achievement: 10/21/19 Potential to Achieve Goals: Fair Progress towards PT goals: Not progressing toward goals - comment (limited progress)    Frequency    Min 2X/week      PT Plan Current plan remains appropriate    Co-evaluation              AM-PAC PT "6 Clicks" Mobility   Outcome Measure  Help needed turning from your back to your side while in a flat bed without using bedrails?: Total Help needed moving from lying on your back to sitting on the side of a flat bed without using bedrails?: Total Help needed moving to and from  a bed to a chair (including a wheelchair)?: Total Help needed standing up from a chair using your arms (e.g., wheelchair or bedside chair)?: Total Help needed to walk in hospital room?: Total Help needed climbing 3-5 steps with a railing? : Total 6 Click Score: 6    End of Session Equipment Utilized During Treatment: Oxygen Activity Tolerance: Patient tolerated treatment well Patient left: in bed;with bed alarm set Nurse Communication: Mobility status;Need for lift equipment PT Visit Diagnosis: Other abnormalities of gait and mobility (R26.89);Difficulty in walking, not elsewhere classified (R26.2);Other symptoms and signs involving the nervous system (R29.898)     Time: 1440-1505 PT Time Calculation (min) (ACUTE ONLY): 25 min  Charges:  $Therapeutic Exercise: 8-22 mins $Therapeutic Activity: 8-22 mins                     Anise Salvo, PT Acute Rehab Services Pager [phone] [facility] [phone]     Rayetta Humphrey 10/13/2019, 3:24 PM

## 2019-10-13 NOTE — Progress Notes (Signed)
Trach sutures removed per Dr. Katrinka Blazing.

## 2019-10-13 NOTE — Progress Notes (Signed)
PROGRESS NOTE    PHYLLISS STREGE   DQQ:[id number]  DOB: 10-Mar-1985  DOA: 09/24/2019 PCP: Patient, No Pcp Per   Brief Narrative:  Beverly [name] is a 34 y/o female with presented to the hospital for chest pain. She was 2 wks post emergency C section due to PROM. In the ED , her chest pain resolved but she subsequently had a VF arrest (polymorphin V tach and then V fib) with ROSC after defib x 3 and epi x 1. Immediately after arrest ST elevation was noted on EKG.  She was taken to the cath lab at [facility] where an aneurysmal dilatation (dissection?) of the LAD was identified. She was transferred to [facility] for ICU admission.   LHC 8/17 > No coronary artery occlusion or critical stenosis. There is aneurysmal dilation of the ostial and proximal LAD with possible ulceration or dissection of uncertain chronicity. TIMI-3 flow is noted throughout the LAD and its branches. Severely reduced left ventricular systolic function with mid/apical anterior, apical, and apical inferior hypokinesis/akinesis. LV gram 30% EF.   Echo 8/18> LVEF 30-35%, septal and apical akinesis distal anterior wall an d mid/distal inferior wall hypokinesis hyperdynamic basal function. LV size mildly dilated.  MRI brain 8/23: Normal  8/30 underwent tracheostomy 8/30 PEG tube placement  Remains encephalopathic. She will open eyes but does not track and does not follow commands. She does not attempt to talk. She is able to move all extremities independently.   Subjective: Encephalpathic. Non verbal.     Assessment & Plan:   Active Problems:   Cardiac arrest related to NSTEMI (anterior MI)  - suspected LAD dissection- cont Aspirin, Coreg, Digoxin, Lipitor - underwent rewarming protocol - now is s/p trach and Peg- cont Vital 50 cc/hr- currently not receiving free water (to prevent fluid overload) - has trach collar- a great deal of pale yellow secretions noted - Scopolamine patch ordered for increased trach  secretions - continue current care  Acute systolic cardiomyopathy with EF of 35% - cont Entresto and Spironolactone - heart failure team has been following- as mentioned above, hold free water and following I and O  Acute metabolic encephalopathy - likely a result of cardiac arrest and anoxic brain injury - will open eyes but otherwise  non communicative- does not follow commands  - all imaging (MRI) negative - neuro had no further recommendations  Low grade fevers - CXR > Right basilar aspiration/pneumonia is difficult to exclude. - ? Aspiration pneumonia - She had temps occasionally rising up to 100s - she is on Cefepime since 9/2 - last Temp was 100.9 on 9/3 - WBC improved from 13.5 on 9/2 to normal  Thrombocytosis - platelets now 722- follow  Diarrhea - hold laxatives and follow     Time spent in minutes: 35 DVT prophylaxis: Full code Code Status: Full code Family Communication:  Disposition Plan:  Status is: Inpatient  Remains inpatient appropriate because:remains encephalopathic   Dispo: The patient is from: Home              Anticipated d/c is to: SNF              Anticipated d/c date is: > 3 days              Patient currently is not medically stable to d/c.    Consultants:   PCCM  Cardiology  Neurology  IR  Antimicrobials:  Anti-infectives (From admission, onward)   Start     Dose/Rate Route  Frequency Ordered Stop   10/09/19 2100  vancomycin (VANCOREADY) IVPB 1500 mg/300 mL  Status:  Discontinued        1,500 mg 150 mL/hr over 120 Minutes Intravenous Every 12 hours 10/09/19 1237 10/10/19 1121   10/09/19 0800  ceFEPIme (MAXIPIME) 2 g in sodium chloride 0.9 % 100 mL IVPB        2 g 200 mL/hr over 30 Minutes Intravenous Every 8 hours 10/09/19 0752 10/16/19 0559   10/09/19 0800  vancomycin (VANCOREADY) IVPB 2000 mg/400 mL        2,000 mg 200 mL/hr over 120 Minutes Intravenous  Once 10/09/19 0752 10/09/19 1147   10/07/19 1540  ceFAZolin (ANCEF)  2-4 GM/100ML-% IVPB       Note to Pharmacy: [name] Manges   : cabinet override      10/07/19 1540 10/08/19 0344   10/07/19 1400  ceFAZolin (ANCEF) IVPB 2g/100 mL premix        2 g 200 mL/hr over 30 Minutes Intravenous To Radiology 10/06/19 1339 10/07/19 1627   09/30/19 0300  vancomycin (VANCOCIN) IVPB 1000 mg/200 mL premix  Status:  Discontinued        1,000 mg 200 mL/hr over 60 Minutes Intravenous Every 8 hours 09/29/19 1809 10/01/19 0853   09/29/19 1815  ceFEPIme (MAXIPIME) 2 g in sodium chloride 0.9 % 100 mL IVPB        2 g 200 mL/hr over 30 Minutes Intravenous Every 8 hours 09/29/19 1803 10/05/19 1801   09/29/19 1815  vancomycin (VANCOREADY) IVPB 2000 mg/400 mL        2,000 mg 200 mL/hr over 120 Minutes Intravenous  Once 09/29/19 1803 09/29/19 2137   09/24/19 2200  cefTRIAXone (ROCEPHIN) 2 g in sodium chloride 0.9 % 100 mL IVPB        2 g 200 mL/hr over 30 Minutes Intravenous Daily at bedtime 09/24/19 1201 09/28/19 2338   09/24/19 0330  cefTRIAXone (ROCEPHIN) 1 g in sodium chloride 0.9 % 100 mL IVPB  Status:  Discontinued        1 g 200 mL/hr over 30 Minutes Intravenous Daily at bedtime 09/24/19 0245 09/24/19 1201   09/24/19 0300  azithromycin (ZITHROMAX) 500 mg in sodium chloride 0.9 % 250 mL IVPB  Status:  Discontinued        500 mg 250 mL/hr over 60 Minutes Intravenous Daily at bedtime 09/24/19 0245 09/24/19 1103       Objective: Vitals:   10/13/19 0721 10/13/19 0750 10/13/19 1125 10/13/19 1204  BP: 109/73   112/69  Pulse: 79 83 94 73  Resp: 20 (!) 21 18 (!) 21  Temp: 99.9 F (37.7 C)   98.5 F (36.9 C)  TempSrc: Axillary   Axillary  SpO2: 99% 100% 98% 99%  Weight:      Height:        Intake/Output Summary (Last 24 hours) at 10/13/2019 1237 Last data filed at 10/13/2019 0600 Gross per 24 hour  Intake 3142.21 ml  Output 1165 ml  Net 1977.21 ml   Filed Weights   10/09/19 0342 10/10/19 0339 10/11/19 0443  Weight: 96 kg 96.6 kg 96.3 kg    Examination: General  exam: Appears comfortable  HEENT: PERRLA, trach with trach collar present- clear yellowish secretions from trach Respiratory system: rhonchi. Respiratory effort normal. Cardiovascular system: S1 & S2 heard, RRR.   Gastrointestinal system: Abdomen soft, non-tender, nondistended. Normal bowel sounds. PEG tube present  Central nervous system: opens eyes, does not follow commands or  attempt to communicate Extremities: No cyanosis, clubbing or edema Skin: No rashes or ulcers      Data Reviewed: I have personally reviewed following labs and imaging studies  CBC: Recent Labs  Lab 10/08/19 0335 10/09/19 0346 10/10/19 0350 10/11/19 0440 10/12/19 0656  WBC 14.6* 13.5* 11.7* 10.3 8.9  HGB 9.3* 9.2* 9.0* 9.1* 9.0*  HCT 30.9* 30.9* 30.1* 31.1* 30.4*  MCV 84.9 85.4 86.7 85.9 84.9  PLT 654* 671* 665* 643* 320*   Basic Metabolic Panel: Recent Labs  Lab 10/07/19 0353 10/07/19 0353 10/08/19 0335 10/08/19 0335 10/09/19 0346 10/10/19 0350 10/11/19 0440 10/12/19 0656 10/13/19 0440  NA 140   < > 137   < > 139 138 137 139 136  K 3.8   < > 3.8   < > 4.1 4.1 3.9 4.3 4.1  CL 103   < > 100   < > 104 106 105 105 102  CO2 25   < > 25   < > '25 24 23 26 24  ' GLUCOSE 86   < > 97   < > 122* 117* 129* 116* 104*  BUN 13   < > 13   < > '13 13 19 19 17  ' CREATININE 0.45   < > 0.52   < > 0.52 0.41* 0.47 0.43* 0.41*  CALCIUM 9.2   < > 9.6   < > 9.4 9.5 9.1 9.4 9.5  MG 1.9   < > 1.8   < > 2.1 1.8 2.1 1.8 1.9  PHOS 4.6  --  4.7*  --  4.0 4.7* 4.3  --   --    < > = values in this interval not displayed.   GFR: Estimated Creatinine Clearance: 126.9 mL/min (A) (by C-G formula based on SCr of 0.41 mg/dL (L)). Liver Function Tests: No results for input(s): AST, ALT, ALKPHOS, BILITOT, PROT, ALBUMIN in the last 168 hours. No results for input(s): LIPASE, AMYLASE in the last 168 hours. Recent Labs  Lab 10/07/19 1136  AMMONIA 15   Coagulation Profile: No results for input(s): INR, PROTIME in the last 168  hours. Cardiac Enzymes: No results for input(s): CKTOTAL, CKMB, CKMBINDEX, TROPONINI in the last 168 hours. BNP (last 3 results) No results for input(s): PROBNP in the last 8760 hours. HbA1C: No results for input(s): HGBA1C in the last 72 hours. CBG: Recent Labs  Lab 10/12/19 2022 10/13/19 0014 10/13/19 0428 10/13/19 0756 10/13/19 1129  GLUCAP 89 111* 102* 98 106*   Lipid Profile: No results for input(s): CHOL, HDL, LDLCALC, TRIG, CHOLHDL, LDLDIRECT in the last 72 hours. Thyroid Function Tests: No results for input(s): TSH, T4TOTAL, FREET4, T3FREE, THYROIDAB in the last 72 hours. Anemia Panel: No results for input(s): VITAMINB12, FOLATE, FERRITIN, TIBC, IRON, RETICCTPCT in the last 72 hours. Urine analysis:    Component Value Date/Time   COLORURINE YELLOW 10/08/2019 1316   APPEARANCEUR HAZY (A) 10/08/2019 1316   APPEARANCEUR Clear 03/26/2015 1458   LABSPEC 1.031 (H) 10/08/2019 1316   PHURINE 5.0 10/08/2019 1316   GLUCOSEU NEGATIVE 10/08/2019 1316   HGBUR MODERATE (A) 10/08/2019 1316   BILIRUBINUR NEGATIVE 10/08/2019 1316   BILIRUBINUR neg 10/12/2015 1709   BILIRUBINUR Negative 03/26/2015 1458   KETONESUR NEGATIVE 10/08/2019 1316   PROTEINUR 30 (A) 10/08/2019 1316   UROBILINOGEN negative 10/12/2015 1709   NITRITE NEGATIVE 10/08/2019 1316   LEUKOCYTESUR MODERATE (A) 10/08/2019 1316   Sepsis Labs: '@LABRCNTIP' (procalcitonin:4,lacticidven:4) ) Recent Results (from the past 240 hour(s))  Culture, blood (routine x  2)     Status: None   Collection Time: 10/08/19  9:01 AM   Specimen: BLOOD RIGHT HAND  Result Value Ref Range Status   Specimen Description BLOOD RIGHT HAND  Final   Special Requests   Final    BOTTLES DRAWN AEROBIC ONLY Blood Culture adequate volume   Culture   Final    NO GROWTH 5 DAYS Performed at [facility], [address] [place], [place] [address]    Report Status 10/13/2019 FINAL  Final  Culture, blood (routine x 2)     Status: None    Collection Time: 10/08/19  9:03 AM   Specimen: BLOOD RIGHT HAND  Result Value Ref Range Status   Specimen Description BLOOD RIGHT HAND  Final   Special Requests   Final    BOTTLES DRAWN AEROBIC ONLY Blood Culture adequate volume   Culture   Final    NO GROWTH 5 DAYS Performed at [facility], [place] [address], [place], [place] [address]    Report Status 10/13/2019 FINAL  Final  Culture, respiratory (non-expectorated)     Status: None   Collection Time: 10/08/19 10:55 AM   Specimen: Tracheal Aspirate; Respiratory  Result Value Ref Range Status   Specimen Description TRACHEAL ASPIRATE  Final   Special Requests NONE  Final   Gram Stain   Final    FEW WBC PRESENT, PREDOMINANTLY PMN RARE GRAM NEGATIVE RODS RARE GRAM POSITIVE COCCI IN CHAINS Performed at [facility], [place] [address] [place], [place] [address]    Culture   Final    FEW STAPHYLOCOCCUS AUREUS RARE ENTEROBACTER AEROGENES    Report Status 10/10/2019 FINAL  Final   Organism ID, Bacteria STAPHYLOCOCCUS AUREUS  Final   Organism ID, Bacteria ENTEROBACTER AEROGENES  Final      Susceptibility   Enterobacter aerogenes - MIC*    CEFAZOLIN >=64 RESISTANT Resistant     CEFEPIME <=0.12 SENSITIVE Sensitive     CEFTAZIDIME 4 SENSITIVE Sensitive     CEFTRIAXONE 2 INTERMEDIATE Intermediate     CIPROFLOXACIN <=0.25 SENSITIVE Sensitive     GENTAMICIN <=1 SENSITIVE Sensitive     IMIPENEM 1 SENSITIVE Sensitive     TRIMETH/SULFA <=20 SENSITIVE Sensitive     PIP/TAZO <=4 SENSITIVE Sensitive     * RARE ENTEROBACTER AEROGENES   Staphylococcus aureus - MIC*    CIPROFLOXACIN <=0.5 SENSITIVE Sensitive     ERYTHROMYCIN RESISTANT Resistant     GENTAMICIN <=0.5 SENSITIVE Sensitive     OXACILLIN <=0.25 SENSITIVE Sensitive     TETRACYCLINE <=1 SENSITIVE Sensitive     VANCOMYCIN 1 SENSITIVE Sensitive     TRIMETH/SULFA <=10 SENSITIVE Sensitive     CLINDAMYCIN RESISTANT Resistant     RIFAMPIN <=0.5 SENSITIVE Sensitive     Inducible  Clindamycin POSITIVE Resistant     * FEW STAPHYLOCOCCUS AUREUS  Culture, Urine     Status: None   Collection Time: 10/08/19  1:16 PM   Specimen: Urine, Random  Result Value Ref Range Status   Specimen Description URINE, RANDOM  Final   Special Requests NONE  Final   Culture   Final    NO GROWTH Performed at [facility], [address] [place], [place] [address]    Report Status 10/09/2019 FINAL  Final         Radiology Studies: No results found.    Scheduled Meds: . aspirin  81 mg Per Tube Daily  . atorvastatin  80 mg  Per Tube Daily  . carvedilol  6.25 mg Per Tube BID WC  . chlorhexidine gluconate (MEDLINE KIT)  15 mL Mouth Rinse BID  . Chlorhexidine Gluconate Cloth  6 each Topical Daily  . digoxin  0.125 mg Per Tube Daily  . enoxaparin (LOVENOX) injection  40 mg Subcutaneous Q24H  . feeding supplement (PROSource TF)  90 mL Per Tube TID  . insulin aspart  0-15 Units Subcutaneous Q4H  . mouth rinse  15 mL Mouth Rinse 10 times per day  . prenatal multivitamin  1 tablet Per Tube Q1200  . sacubitril-valsartan  1 tablet Per Tube BID  . scopolamine  1 patch Transdermal Q72H  . sodium chloride flush  10-40 mL Intracatheter Q12H  . spironolactone  25 mg Per Tube Daily   Continuous Infusions: . sodium chloride Stopped (10/13/19 0554)  . ceFEPime (MAXIPIME) IV 200 mL/hr at 10/13/19 0600  . feeding supplement (VITAL 1.5 CAL) 50 mL/hr at 10/12/19 1900     LOS: 19 days      Debbe Odea, MD [facility] Pager: [email or web address] 10/13/2019, 12:37 PM

## 2019-10-14 LAB — BASIC METABOLIC PANEL
Anion gap: 11 (ref 5–15)
BUN: 15 mg/dL (ref 6–20)
CO2: 25 mmol/L (ref 22–32)
Calcium: 9.8 mg/dL (ref 8.9–10.3)
Chloride: 104 mmol/L (ref 98–111)
Creatinine, Ser: 0.41 mg/dL — ABNORMAL LOW (ref 0.44–1.00)
GFR calc Af Amer: >60 mL/min (ref >60)
GFR calc non Af Amer: >60 mL/min (ref >60)
Glucose, Bld: 125 mg/dL — ABNORMAL HIGH (ref 70–99)
Potassium: 3.9 mmol/L (ref 3.5–5.1)
Sodium: 140 mmol/L (ref 135–145)

## 2019-10-14 LAB — GLUCOSE, CAPILLARY
Glucose-Capillary: 102 mg/dL — ABNORMAL HIGH (ref 70–99)
Glucose-Capillary: 103 mg/dL — ABNORMAL HIGH (ref 70–99)
Glucose-Capillary: 105 mg/dL — ABNORMAL HIGH (ref 70–99)
Glucose-Capillary: 122 mg/dL — ABNORMAL HIGH (ref 70–99)
Glucose-Capillary: 128 mg/dL — ABNORMAL HIGH (ref 70–99)
Glucose-Capillary: 98 mg/dL (ref 70–99)

## 2019-10-14 LAB — MAGNESIUM: Magnesium: 1.8 mg/dL (ref 1.7–2.4)

## 2019-10-14 LAB — BRAIN NATRIURETIC PEPTIDE: B Natriuretic Peptide: 200.4 pg/mL — ABNORMAL HIGH (ref 0.0–100.0)

## 2019-10-14 MED ORDER — OSMOLITE 1.5 CAL PO LIQD
1000.0000 mL | ORAL | Status: DC
  Administered 2019-10-14 – 2019-10-27 (×11): 1000 mL
  Filled 2019-10-14 (×25): qty 1000

## 2019-10-14 MED ORDER — MAGNESIUM SULFATE 2 GM/50ML IV SOLN
2.0000 g | Freq: Once | INTRAVENOUS | Status: AC
  Administered 2019-10-14: 2 g via INTRAVENOUS
  Filled 2019-10-14: qty 50

## 2019-10-14 MED ORDER — DIPHENOXYLATE-ATROPINE 2.5-0.025 MG/5ML PO LIQD
5.0000 mL | Freq: Four times a day (QID) | ORAL | Status: DC | PRN
  Filled 2019-10-14 (×2): qty 5

## 2019-10-14 MED ORDER — PROSOURCE TF PO LIQD
45.0000 mL | Freq: Four times a day (QID) | ORAL | Status: DC
  Administered 2019-10-14 – 2019-10-30 (×59): 45 mL
  Filled 2019-10-14 (×67): qty 45

## 2019-10-14 NOTE — Progress Notes (Addendum)
PROGRESS NOTE    Beverly [name]   IXV:[id number]  DOB: 05-Jan-1986  DOA: 09/24/2019 PCP: Patient, No Pcp Per   Brief Narrative:  Beverly [name] is a 34 y/o female with presented to the hospital for chest pain. She was 2 wks post emergency C section due to PROM. In the ED , her chest pain resolved but she subsequently had a VF arrest (polymorphin V tach and then V fib) with ROSC after defib x 3 and epi x 1. Immediately after arrest ST elevation was noted on EKG.  She was taken to the cath lab at [facility] where an aneurysmal dilatation (dissection?) of the LAD was identified. She was transferred to [facility] for ICU admission.   LHC 8/17 > No coronary artery occlusion or critical stenosis. There is aneurysmal dilation of the ostial and proximal LAD with possible ulceration or dissection of uncertain chronicity. TIMI-3 flow is noted throughout the LAD and its branches. Severely reduced left ventricular systolic function with mid/apical anterior, apical, and apical inferior hypokinesis/akinesis. LV gram 30% EF.   Echo 8/18> LVEF 30-35%, septal and apical akinesis distal anterior wall an d mid/distal inferior wall hypokinesis hyperdynamic basal function. LV size mildly dilated.  MRI brain 8/23: Normal  8/30 underwent tracheostomy 8/30 PEG tube placement  Remains encephalopathic. She will open eyes but does not track and does not follow commands. She does not attempt to talk. She is able to move all extremities independently.   Subjective: Ongoing encephalopathy.     Assessment & Plan:   Active Problems:   Cardiac arrest related to NSTEMI (anterior MI)  - suspected LAD dissection- cont Aspirin, Coreg, Digoxin, Lipitor - underwent rewarming protocol - now is s/p trach and Peg- cont Vital 50 cc/hr- currently not receiving free water (to prevent fluid overload) - has trach collar- a great deal of pale yellow secretions noted - Scopolamine patch ordered for increased trach  secretions- needs frequent suctioning - continue current care  Acute systolic cardiomyopathy with EF of 35% - cont Entresto and Spironolactone - heart failure team has been following- as mentioned above, hold free water and following I and O and daily weights  Acute metabolic encephalopathy - likely a result of cardiac arrest and anoxic brain injury - will open eyes but otherwise  non communicative- does not follow commands  - all imaging (MRI) negative - neuro had no further recommendations -  She does not seem to acknowledge me or follow commands but her mother was able to capture a picture of the patient looking at her and smiling at her    Low grade fevers - CXR > Right basilar aspiration/pneumonia is difficult to exclude. - ? Aspiration pneumonia - cultures of secretions> staph aureus, enterobacter aerogenes - She had temps occasionally rising up to 100s - she is on Cefepime since 9/2 - last Temp was 100.9 on 9/3 - WBC improved from 13.5 on 9/2 to normal  Thrombocytosis - platelets now 722- follow  Diarrhea - hold laxatives and follow- per record in Epic, stool output was about 200 cc yesterday- add PRN Lomotil     Time spent in minutes: 35 DVT prophylaxis: Full code Code Status: Full code Family Communication:  Disposition Plan:  Status is: Inpatient  Remains inpatient appropriate because:remains encephalopathic   Dispo: The patient is from: Home              Anticipated d/c is to: SNF  Anticipated d/c date is: > 3 days              Patient currently is not medically stable to d/c.    Consultants:   PCCM  Cardiology  Neurology  IR  Antimicrobials:  Anti-infectives (From admission, onward)   Start     Dose/Rate Route Frequency Ordered Stop   10/09/19 2100  vancomycin (VANCOREADY) IVPB 1500 mg/300 mL  Status:  Discontinued        1,500 mg 150 mL/hr over 120 Minutes Intravenous Every 12 hours 10/09/19 1237 10/10/19 1121   10/09/19 0800   ceFEPIme (MAXIPIME) 2 g in sodium chloride 0.9 % 100 mL IVPB        2 g 200 mL/hr over 30 Minutes Intravenous Every 8 hours 10/09/19 0752 10/16/19 0559   10/09/19 0800  vancomycin (VANCOREADY) IVPB 2000 mg/400 mL        2,000 mg 200 mL/hr over 120 Minutes Intravenous  Once 10/09/19 0752 10/09/19 1147   10/07/19 1540  ceFAZolin (ANCEF) 2-4 GM/100ML-% IVPB       Note to Pharmacy: [name] Manges   : cabinet override      10/07/19 1540 10/08/19 0344   10/07/19 1400  ceFAZolin (ANCEF) IVPB 2g/100 mL premix        2 g 200 mL/hr over 30 Minutes Intravenous To Radiology 10/06/19 1339 10/07/19 1627   09/30/19 0300  vancomycin (VANCOCIN) IVPB 1000 mg/200 mL premix  Status:  Discontinued        1,000 mg 200 mL/hr over 60 Minutes Intravenous Every 8 hours 09/29/19 1809 10/01/19 0853   09/29/19 1815  ceFEPIme (MAXIPIME) 2 g in sodium chloride 0.9 % 100 mL IVPB        2 g 200 mL/hr over 30 Minutes Intravenous Every 8 hours 09/29/19 1803 10/05/19 1801   09/29/19 1815  vancomycin (VANCOREADY) IVPB 2000 mg/400 mL        2,000 mg 200 mL/hr over 120 Minutes Intravenous  Once 09/29/19 1803 09/29/19 2137   09/24/19 2200  cefTRIAXone (ROCEPHIN) 2 g in sodium chloride 0.9 % 100 mL IVPB        2 g 200 mL/hr over 30 Minutes Intravenous Daily at bedtime 09/24/19 1201 09/28/19 2338   09/24/19 0330  cefTRIAXone (ROCEPHIN) 1 g in sodium chloride 0.9 % 100 mL IVPB  Status:  Discontinued        1 g 200 mL/hr over 30 Minutes Intravenous Daily at bedtime 09/24/19 0245 09/24/19 1201   09/24/19 0300  azithromycin (ZITHROMAX) 500 mg in sodium chloride 0.9 % 250 mL IVPB  Status:  Discontinued        500 mg 250 mL/hr over 60 Minutes Intravenous Daily at bedtime 09/24/19 0245 09/24/19 1103       Objective: Vitals:   10/14/19 0732 10/14/19 0828 10/14/19 1152 10/14/19 1314  BP: 113/78 113/78  120/90  Pulse: 80 84 79 82  Resp: (!) _0 (!) 23  Temp: 99 F (37.2 C)   98.9 F (37.2 C)  TempSrc: Oral   Axillary    SpO2: 99% 100% 93% 97%  Weight:      Height:        Intake/Output Summary (Last 24 hours) at 10/14/2019 1419 Last data filed at 10/14/2019 0700 Gross per 24 hour  Intake 1225.38 ml  Output 1385 ml  Net -159.62 ml   Filed Weights   10/09/19 0342 10/10/19 0339 10/11/19 0443  Weight: 96 kg 96.6 kg 96.3 kg  Examination: General exam: Appears comfortable  HEENT: PERRL, trach present with a large amount of pale yellow secretions draining out Respiratory system: Clear to auscultation. Respiratory effort normal.  Cardiovascular system: S1 & S2 heard,  No murmurs  Gastrointestinal system: Abdomen soft, non-tender, nondistended. Normal bowel sounds- PEG tube intact  Central nervous system: opens eyes, blinks to threat, moves extremities . Extremities: No cyanosis, clubbing or edema Skin: No rashes or ulcers        Data Reviewed: I have personally reviewed following labs and imaging studies  CBC: Recent Labs  Lab 10/08/19 0335 10/09/19 0346 10/10/19 0350 10/11/19 0440 10/12/19 0656  WBC 14.6* 13.5* 11.7* 10.3 8.9  HGB 9.3* 9.2* 9.0* 9.1* 9.0*  HCT 30.9* 30.9* 30.1* 31.1* 30.4*  MCV 84.9 85.4 86.7 85.9 84.9  PLT 654* 671* 665* 643* 546*   Basic Metabolic Panel: Recent Labs  Lab 10/08/19 0335 10/08/19 0335 10/09/19 0346 10/09/19 0346 10/10/19 0350 10/11/19 0440 10/12/19 0656 10/13/19 0440 10/14/19 0500  NA 137   < > 139   < > 138 137 139 136 140  K 3.8   < > 4.1   < > 4.1 3.9 4.3 4.1 3.9  CL 100   < > 104   < > 106 105 105 102 104  CO2 25   < > 25   < > _0 GLUCOSE 97   < > 122*   < > 117* 129* 116* 104* 125*  BUN 13   < > 13   < > _1 CREATININE 0.52   < > 0.52   < > 0.41* 0.47 0.43* 0.41* 0.41*  CALCIUM 9.6   < > 9.4   < > 9.5 9.1 9.4 9.5 9.8  MG 1.8   < > 2.1   < > 1.8 2.1 1.8 1.9 1.8  PHOS 4.7*  --  4.0  --  4.7* 4.3  --   --   --    < > = values in this interval not displayed.   GFR: Estimated Creatinine Clearance: 126.9 mL/min  (A) (by C-G formula based on SCr of 0.41 mg/dL (L)). Liver Function Tests: No results for input(s): AST, ALT, ALKPHOS, BILITOT, PROT, ALBUMIN in the last 168 hours. No results for input(s): LIPASE, AMYLASE in the last 168 hours. No results for input(s): AMMONIA in the last 168 hours. Coagulation Profile: No results for input(s): INR, PROTIME in the last 168 hours. Cardiac Enzymes: No results for input(s): CKTOTAL, CKMB, CKMBINDEX, TROPONINI in the last 168 hours. BNP (last 3 results) No results for input(s): PROBNP in the last 8760 hours. HbA1C: No results for input(s): HGBA1C in the last 72 hours. CBG: Recent Labs  Lab 10/13/19 2002 10/14/19 0038 10/14/19 0352 10/14/19 0816 10/14/19 1147  GLUCAP 112* 128* 105* 98 102*   Lipid Profile: No results for input(s): CHOL, HDL, LDLCALC, TRIG, CHOLHDL, LDLDIRECT in the last 72 hours. Thyroid Function Tests: No results for input(s): TSH, T4TOTAL, FREET4, T3FREE, THYROIDAB in the last 72 hours. Anemia Panel: No results for input(s): VITAMINB12, FOLATE, FERRITIN, TIBC, IRON, RETICCTPCT in the last 72 hours. Urine analysis:    Component Value Date/Time   COLORURINE YELLOW 10/08/2019 1316   APPEARANCEUR HAZY (A) 10/08/2019 1316   APPEARANCEUR Clear 03/26/2015 1458   LABSPEC 1.031 (H) 10/08/2019 1316   PHURINE 5.0 10/08/2019 1316   GLUCOSEU NEGATIVE 10/08/2019 1316   HGBUR MODERATE (A) 10/08/2019 1316   BILIRUBINUR NEGATIVE 10/08/2019  [place] neg 10/12/2015 1709   BILIRUBINUR Negative 03/26/2015 [place] 10/08/2019 1316   PROTEINUR 30 (A) 10/08/2019 1316   UROBILINOGEN negative 10/12/2015 1709   NITRITE NEGATIVE 10/08/2019 1316   LEUKOCYTESUR MODERATE (A) 10/08/2019 1316   Sepsis Labs: _0 (procalcitonin:4,lacticidven:4) ) Recent Results (from the past 240 hour(s))  Culture, blood (routine x 2)     Status: None   Collection Time: 10/08/19  9:01 AM   Specimen: BLOOD RIGHT HAND  Result Value  Ref Range Status   Specimen Description BLOOD RIGHT HAND  Final   Special Requests   Final    BOTTLES DRAWN AEROBIC ONLY Blood Culture adequate volume   Culture   Final    NO GROWTH 5 DAYS Performed at [facility], [place] [address], [place], [place] [address]    Report Status 10/13/2019 FINAL  Final  Culture, blood (routine x 2)     Status: None   Collection Time: 10/08/19  9:03 AM   Specimen: BLOOD RIGHT HAND  Result Value Ref Range Status   Specimen Description BLOOD RIGHT HAND  Final   Special Requests   Final    BOTTLES DRAWN AEROBIC ONLY Blood Culture adequate volume   Culture   Final    NO GROWTH 5 DAYS Performed at  [facility], [place] [address], [place], [place] [address]    Report Status 10/13/2019 FINAL  Final  Culture, respiratory (non-expectorated)     Status: None   Collection Time: 10/08/19 10:55 AM   Specimen: Tracheal Aspirate; Respiratory  Result Value Ref Range Status   Specimen Description TRACHEAL ASPIRATE  Final   Special Requests NONE  Final   Gram Stain   Final    FEW WBC PRESENT, PREDOMINANTLY PMN RARE GRAM NEGATIVE RODS RARE GRAM POSITIVE COCCI IN CHAINS Performed at [facility], [place] [address], [place], [place] [address]    Culture   Final    FEW STAPHYLOCOCCUS AUREUS RARE ENTEROBACTER AEROGENES    Report Status 10/10/2019 FINAL  Final   Organism ID, Bacteria STAPHYLOCOCCUS AUREUS  Final   Organism ID, Bacteria ENTEROBACTER AEROGENES  Final      Susceptibility   Enterobacter aerogenes - MIC*    CEFAZOLIN >=64 RESISTANT Resistant     CEFEPIME <=0.12 SENSITIVE Sensitive     CEFTAZIDIME 4 SENSITIVE Sensitive     CEFTRIAXONE 2 INTERMEDIATE Intermediate     CIPROFLOXACIN <=0.25 SENSITIVE Sensitive     GENTAMICIN <=1 SENSITIVE Sensitive     IMIPENEM 1 SENSITIVE Sensitive     TRIMETH/SULFA <=20 SENSITIVE Sensitive     PIP/TAZO <=4 SENSITIVE Sensitive     * RARE ENTEROBACTER AEROGENES   Staphylococcus aureus - MIC*     CIPROFLOXACIN <=0.5 SENSITIVE Sensitive     ERYTHROMYCIN RESISTANT Resistant     GENTAMICIN <=0.5 SENSITIVE Sensitive     OXACILLIN <=0.25 SENSITIVE Sensitive     TETRACYCLINE <=1 SENSITIVE Sensitive     VANCOMYCIN 1 SENSITIVE Sensitive     TRIMETH/SULFA <=10 SENSITIVE Sensitive     CLINDAMYCIN RESISTANT Resistant     RIFAMPIN <=0.5 SENSITIVE Sensitive     Inducible Clindamycin POSITIVE Resistant     * FEW STAPHYLOCOCCUS AUREUS  Culture, Urine     Status: None   Collection Time: 10/08/19  1:16 PM   Specimen: Urine, Random  Result Value Ref Range Status   Specimen Description URINE, RANDOM  Final   Special Requests NONE  Final  Culture   Final    NO GROWTH Performed at [facility], [place] [address] [place], [place] [address]    Report Status 10/09/2019 FINAL  Final         Radiology Studies: No results found.    Scheduled Meds: . aspirin  81 mg Per Tube Daily  . atorvastatin  80 mg Per Tube Daily  . carvedilol  6.25 mg Per Tube BID WC  . chlorhexidine gluconate (MEDLINE KIT)  15 mL Mouth Rinse BID  . Chlorhexidine Gluconate Cloth  6 each Topical Daily  . digoxin  0.125 mg Per Tube Daily  . enoxaparin (LOVENOX) injection  40 mg Subcutaneous Q24H  . feeding supplement (PROSource TF)  90 mL Per Tube TID  . insulin aspart  0-15 Units Subcutaneous Q4H  . mouth rinse  15 mL Mouth Rinse 10 times per day  . prenatal multivitamin  1 tablet Per Tube Q1200  . sacubitril-valsartan  1 tablet Per Tube BID  . scopolamine  1 patch Transdermal Q72H  . sodium chloride flush  10-40 mL Intracatheter Q12H  . spironolactone  25 mg Per Tube Daily   Continuous Infusions: . sodium chloride 10 mL/hr at 10/14/19 0700  . ceFEPime (MAXIPIME) IV Stopped (10/14/19 0631)  . feeding supplement (VITAL 1.5 CAL) 1,000 mL (10/13/19 1440)     LOS: 20 days      Debbe Odea, MD [facility] Pager: [email or web address] 10/14/2019, 2:19 PM

## 2019-10-14 NOTE — Progress Notes (Addendum)
ADVANCED HF PROGRESS  Patient Name: Beverly [name] Date of Encounter: 10/14/2019  [facility] Cardiologist: No primary care provider on file.   Subjective   Tracking over the weekend.   Inpatient Medications    Scheduled Meds: . aspirin  81 mg Per Tube Daily  . atorvastatin  80 mg Per Tube Daily  . carvedilol  6.25 mg Per Tube BID WC  . chlorhexidine gluconate (MEDLINE KIT)  15 mL Mouth Rinse BID  . Chlorhexidine Gluconate Cloth  6 each Topical Daily  . digoxin  0.125 mg Per Tube Daily  . enoxaparin (LOVENOX) injection  40 mg Subcutaneous Q24H  . feeding supplement (PROSource TF)  90 mL Per Tube TID  . insulin aspart  0-15 Units Subcutaneous Q4H  . mouth rinse  15 mL Mouth Rinse 10 times per day  . prenatal multivitamin  1 tablet Per Tube Q1200  . sacubitril-valsartan  1 tablet Per Tube BID  . scopolamine  1 patch Transdermal Q72H  . sodium chloride flush  10-40 mL Intracatheter Q12H  . spironolactone  25 mg Per Tube Daily   Continuous Infusions: . sodium chloride 10 mL/hr at 10/14/19 0700  . ceFEPime (MAXIPIME) IV Stopped (10/14/19 0631)  . feeding supplement (VITAL 1.5 CAL) 1,000 mL (10/13/19 1440)   PRN Meds: acetaminophen (TYLENOL) oral liquid 160 mg/5 mL, hydrALAZINE, sodium chloride flush   Vital Signs    Vitals:   10/14/19 0009 10/14/19 0020 10/14/19 0347 10/14/19 0732  BP:  103/74  113/78  Pulse: 83 85 85 80  Resp: _0 (!) 22  Temp: 99.3 F (37.4 C)   99 F (37.2 C)  TempSrc: Axillary   Oral  SpO2:  97% 99% 99%  Weight:      Height:        Intake/Output Summary (Last 24 hours) at 10/14/2019 0753 Last data filed at 10/14/2019 0700 Gross per 24 hour  Intake 1225.38 ml  Output 1385 ml  Net -159.62 ml   Last 3 Weights 10/11/2019 10/10/2019 10/09/2019  Weight (lbs) 212 lb 4.9 oz 212 lb 15.4 oz 211 lb 10.3 oz  Weight (kg) 96.3 kg 96.6 kg 96 kg  Some encounter information is confidential and restricted. Go to Review Flowsheets activity to see all  data.       Telemetry  SR 80-90s no SVT.    Physical Exam  General:   No resp difficulty HEENT: normal Neck:Trach on trach collar. ,  Supple. JVP 6-7 . Carotids 2+ bilat; no bruits. No lymphadenopathy or thryomegaly appreciated. Cor: PMI nondisplaced. Regular rate & rhythm. No rubs, gallops or murmurs. Lungs: Coarse throughout Abdomen: soft, nontender, nondistended. No hepatosplenomegaly. No bruits or masses. Good bowel sounds.Peg tube.  Extremities: no cyanosis, clubbing, rash, edema. RUE pICC Neuro: Tracking. MAE but not purposeful.   Labs    High Sensitivity Troponin:   Recent Labs  Lab 09/23/19 1539 09/23/19 1816 09/24/19 0039 09/24/19 0826  TROPONINIHS 127* 2,078* 24,421* >27,000*      Chemistry Recent Labs  Lab 10/12/19 0656 10/13/19 0440 10/14/19 0500  NA 139 136 140  K 4.3 4.1 3.9  CL 105 102 104  CO2 _1 GLUCOSE 116* 104* 125*  BUN _2 CREATININE 0.43* 0.41* 0.41*  CALCIUM 9.4 9.5 9.8  GFRNONAA >60 >60 >60  GFRAA >60 >60 >60  ANIONGAP _3 Hematology Recent Labs  Lab 10/10/19 0350 10/11/19 0440 10/12/19 0656  WBC 11.7*  10.3 8.9  RBC 3.47* 3.62* 3.58*  HGB 9.0* 9.1* 9.0*  HCT 30.1* 31.1* 30.4*  MCV 86.7 85.9 84.9  MCH 25.9* 25.1* 25.1*  MCHC 29.9* 29.3* 29.6*  RDW 18.0* 17.5* 17.2*  PLT 665* 643* 722*    BNP Recent Labs  Lab 10/12/19 0656 10/13/19 0440 10/14/19 0500  BNP 172.2* 186.1* 200.4*     DDimer No results for input(s): DDIMER in the last 168 hours.   Radiology    No results found.  Cardiac Studies   See section above (reviewed)  Patient Profile     34 y.o. female 2 weeks post-partum with VF/torsades arrest in the setting of anterior MI  Assessment & Plan    1. VF arrest (in-hospital): in setting of anterior infarct. Rhythm has stabilized, off amiodarone, no significant arrhythmia on continuous telemetry. Continue beta-blockade.  - Now rewarmed and off sedation. But remains essentially  unresponsive - EEG 8/20 diffuse slowing but improved - Brain MRI normal x 2. EEG still with diffuse slowing - Remains on vent through trach.  - VT quiescent. Keep K> 4.0 Mg > 2.0  2. Acute systolic heart failure: secondary to anterior MI, 09/24/19 LVEF 35%.  - Volume status stable.  - On carvedilol 6.25 bid, Entresto 97-103bid, dig 0.125 and spiro 25 mg daily.  3. CAD/Anterior MI: angio reviewed again. Appears to have aneurysmal proximal LAD with dissection, likely culprit. Had TIMI-3 flow at cath but large infarct.  - Continue ASA,  beta-blocker and statin.  - No evidence ischemia currently - Continue medical therapy for now 4. Encephalopathy, post-arrest.Likely anoxic brain injury  - Tracking more over the last 48 hours. Modofanil stopped.  - MRI 09/29/19 & 10/07/19 negative.  - EEG 8/31 EEG still with diffuse slowing 5. Hypokalemia/hypomag -Stable.  6. Hypertension -Stable. 7. Fevers - ? Neurogenic vs infectious - Blx Cultures repeated 9/1 NGTD. UCx negative - Respiratory Cx = few Staph aureus, rare enterobacter aerogenes -Completed antibiotic course.  8. SVT on 10/03/19 - Resolved. Continue current dose of carvedilol.   Await LTAC placement     Signed, Darrick Grinder, NP  10/14/2019, 7:53 AM     Patient seen and examined with the above-signed Advanced Practice Provider and/or Housestaff. I personally reviewed laboratory data, imaging studies and relevant notes. I independently examined the patient and formulated the important aspects of the plan. I have edited the note to reflect any of my changes or salient points. I have personally discussed the plan with the patient and/or family.  Remains on trach collar. Awake but not following commands. Volume status and vitals stable.   General:  Awake on trach collar HEENT: normal Neck: supple. + trach. no JVD. Carotids 2+ bilat; no bruits. No lymphadenopathy or thryomegaly appreciated. Cor: PMI nondisplaced. Regular rate & rhythm. No rubs,  gallops or murmurs. Lungs: clear Abdomen: soft, nontender, nondistended. No hepatosplenomegaly. No bruits or masses. Good bowel sounds. Extremities: no cyanosis, clubbing, rash, edema Neuro: awake not following commands  Still with severe encephalopathy. Stable from HF and CAD perspective. We will follow at a  distance. Please call with questions.

## 2019-10-14 NOTE — Progress Notes (Signed)
  Speech Language Pathology Treatment: Beverly [name] Speaking valve  Patient Details Name: Beverly [name] MRN: [id number] DOB: [birth year] Today's Date: 10/14/2019 Time: 1829-9371 SLP Time Calculation (min) (ACUTE ONLY): 20 min  Assessment / Plan / Recommendation Clinical Impression  Worked with pt in an effort to facilitate communication.  Pt alert; did not attend to left side, but attended to clinician, made consistent eye contact, and tracked when in right field.  RT arrived to change inner cannula and suction.  Pt was repositioned in bed and PMV was placed for brief intervals of one-three respiratory cycles. VS remained stable; however, pt had difficulty accessing upper airway. She did achieve low volume voicing on two occasions with maximal prompting.  Removal of valve led to immediate burst of air from trach, despite efforts to reposition and improve access.  Downsize to #4 and a change to a cuffless trach would be beneficial. Even if initial success at voicing is spontaneous, she can begin working on vocalizing with purpose.  SLP will continue to follow and will assess swallowing when appropriate.    HPI HPI: Pt is a 34 y.o. female with PMH of HTN who was admitted 09/23/19 2 weeks post partum after emergent C section due to rupture of membranes with VF arrest. Pt with enterobacter PNA and encephalopathy, trach placed 8/30 and PEG on 8/31. MRI 8/31: No evidence of anoxic brain injury.      SLP Plan  Continue with current plan of care       Recommendations   Bedside swallow eval when appropriate; Considering downsizing trach to #4 cuffless       Patient may use Passy-Muir Speech Valve: with SLP only         Oral Care Recommendations: Oral care QID Follow up Recommendations: LTACH SLP Visit Diagnosis: Aphonia (R49.1) Plan: Continue with current plan of care       GO                Beverly [name] 10/14/2019, 3:44 PM  Beverly [name] L. Beverly Frederic, MA CCC/SLP Acute  Rehabilitation Services Office number [phone] Pager [phone]

## 2019-10-14 NOTE — Progress Notes (Signed)
Nutrition Follow-up  DOCUMENTATION CODES:   Not applicable  INTERVENTION:   Transition to standard formula:  -Osmolite 1.5 @ 60  ml/hr via PEG -45 ml ProSource QID  Provides: 2320 kcals, 134 grams protein, 1097 ml free water.   NUTRITION DIAGNOSIS:   Increased nutrient needs related to post-op healing as evidenced by estimated needs.  Ongoing  GOAL:   Patient will meet greater than or equal to 90% of their needs  Addressed via TF  MONITOR:   Vent status, Skin, TF tolerance, I & O's, Labs, Weight trends  REASON FOR ASSESSMENT:   Ventilator    ASSESSMENT:   Patient with PMH significant for HTN and 2 weeks post partum emergency C-section. Presents this admission with new onset peripartum cardiomyopathy with VF arrest.   8/30- trach, PEG  Remains on trach collar. Tracking more over the last 48 hours. Tolerating tube feeding at goal rate via PEG. Nutrition needs adjusted. Transition to standard formula.   Admission weight: 103.1 kg  Current weight: 96.3 kg   I/O: -6,657 ml since 8/24 UOP: 1,200 ml x 24 hrs   Drips: NS @ 10 ml/hr Medications: SS novolog, prenatal MVI, aldactone Labs: CBG 98-112  Diet Order:   Diet Order    None      EDUCATION NEEDS:   Not appropriate for education at this time  Skin:  Skin Assessment: Reviewed RN Assessment  Last BM:  8/31  Height:   Ht Readings from Last 1 Encounters:  09/24/19 5\' 10"  (1.778 m)    Weight:   Wt Readings from Last 1 Encounters:  10/11/19 96.3 kg    BMI:  Body mass index is 30.46 kg/m.  Estimated Nutritional Needs:   Kcal:  2150-2350 kcal  Protein:  115-130 grams  Fluid:  >/= 2 L/day   12/11/19 RD, LDN Clinical Nutrition Pager listed in AMION

## 2019-10-14 NOTE — Progress Notes (Signed)
Occupational Therapy Treatment Patient Details Name: Beverly [name] MRN: [id number] DOB: [birth year] Today's Date: 10/14/2019    History of present illness Pt is 34 yo admitted 09/23/19 2 weeks post partum after emergent C section due to rupture of membranes with VF arrest. Pt with enterobacter PNA, trach 8/30, PEG, encephalopathy. PMHx: HTN   OT comments  Upon arrival, pt with incontinence in bed and requiring repositioning. Pt able to track therapist (doing so ~75% of time) with increased attention to R visual field over L. Pt tracking and sustaining gaze at pictures of her children. Pt requiring Total A +2 for rolling bed and peri care. Pt requiring Total hand over hand for washing her face once repositioned in bed. Pt also stretching back into extension with rolling to left and decreased tolerance for PROM of LUE. Pt will require post-acute rehab and will continue to follow and assess as admitted.    Follow Up Recommendations  SNF;LTACH    Equipment Recommendations  Other (comment) (TBA)    Recommendations for Other Services      Precautions / Restrictions Precautions Precautions: Fall Precaution Comments: flexi, bil prevalon, trach, PEG.       Mobility Bed Mobility Overal bed mobility: Needs Assistance Bed Mobility: Rolling Rolling: Total assist;+2 for physical assistance         General bed mobility comments: Total A to facilitate reach of UEs during rolling and bring hips over  Transfers                 General transfer comment: not appropriate to attempt     Balance                                           ADL either performed or assessed with clinical judgement   ADL Overall ADL's : Needs assistance/impaired     Grooming: Wash/dry face;Bed level;Total assistance Grooming Details (indicate cue type and reason): Total hand over hand for washing her face                     Toileting- Clothing Manipulation and Hygiene: Total  assistance Toileting - Clothing Manipulation Details (indicate cue type and reason): Total A for peri care at bed level       General ADL Comments: Total A     Vision   Vision Assessment?: Yes Additional Comments: Pt with increased attention to R visual field; tracking to right but decreased tracking to left. Pt making eye contact ~50% of time.    Perception     Praxis      Cognition Arousal/Alertness: Lethargic Behavior During Therapy: Flat affect Overall Cognitive Status: Impaired/Different from baseline Area of Impairment: Rancho level               Rancho Levels of Cognitive Functioning Ranch[place] Amigos Scales of Cognitive Functioning: Generalized response               General Comments: pt with eye opening in sitting without visual fixation or tracking- tends to gaze L but did move eyes midline when sitting        Exercises Exercises: General Upper Extremity General Exercises - Upper Extremity Shoulder Flexion: PROM;Both;10 reps;Seated;Limitations Shoulder Flexion Limitations: Resisting ROM of LUE and pulling back into flexion pattern Shoulder Horizontal ABduction: PROM;Both;10 reps Shoulder Horizontal ADduction: PROM;Both;10 reps Elbow Flexion: PROM;Both;10 reps;Seated Elbow Extension: PROM;Both;10 reps;Seated  Wrist Flexion: PROM;10 reps;Both;Supine Wrist Extension: PROM;10 reps;Both;Supine Digit Composite Flexion: PROM;Both;5 reps;Supine Composite Extension: PROM;Both;5 reps;Supine General Exercises - Lower Extremity Hip Flexion/Marching: PROM;Both;10 reps;Seated   Shoulder Instructions       General Comments VSS throughout on trach collar    Pertinent Vitals/ Pain       Pain Assessment: Faces Faces Pain Scale: No hurt Pain Intervention(s): Monitored during session  Home Living                                          Prior Functioning/Environment              Frequency  Min 2X/week        Progress Toward  Goals  OT Goals(current goals can now be found in the care plan section)  Progress towards OT goals: Progressing toward goals  Acute Rehab OT Goals Patient Stated Goal: return home, care for her kids OT Goal Formulation: Patient unable to participate in goal setting Time For Goal Achievement: 10/21/19 Potential to Achieve Goals: Fair ADL Goals Pt Will Perform Grooming: with max assist;sitting Pt/caregiver will Perform Home Exercise Program: Increased strength;Increased ROM;Both right and left upper extremity;With minimal assist;With written HEP provided Additional ADL Goal #1: Pt will follow 1 step command with 25% accuracy. Additional ADL Goal #2: Pt will visually track/attend to meaningful item with max cues.  Plan Discharge plan remains appropriate    Co-evaluation                 AM-PAC OT "6 Clicks" Daily Activity     Outcome Measure   Help from another person eating meals?: Total Help from another person taking care of personal grooming?: Total Help from another person toileting, which includes using toliet, bedpan, or urinal?: Total Help from another person bathing (including washing, rinsing, drying)?: Total Help from another person to put on and taking off regular upper body clothing?: Total Help from another person to put on and taking off regular lower body clothing?: Total 6 Click Score: 6    End of Session Equipment Utilized During Treatment: Oxygen (trach collar)  OT Visit Diagnosis: Other abnormalities of gait and mobility (R26.89);Other symptoms and signs involving cognitive function;Muscle weakness (generalized) (M62.81)   Activity Tolerance Patient tolerated treatment well   Patient Left in bed;with call bell/phone within reach;with bed alarm set   Nurse Communication Mobility status        Time: 1539-1600 OT Time Calculation (min): 21 min  Charges: OT General Charges $OT Visit: 1 Visit OT Treatments $Self Care/Home Management : 8-22  mins  Philander Ake MSOT, OTR/L Acute Rehab Pager: [phone] Office: [phone]   Theodoro Grist Martina Brodbeck 10/14/2019, 5:07 PM

## 2019-10-15 LAB — CBC
HCT: 27.6 % — ABNORMAL LOW (ref 36.0–46.0)
Hemoglobin: 8.3 g/dL — ABNORMAL LOW (ref 12.0–15.0)
MCH: 25.2 pg — ABNORMAL LOW (ref 26.0–34.0)
MCHC: 30.1 g/dL (ref 30.0–36.0)
MCV: 83.6 fL (ref 80.0–100.0)
Platelets: 572 10*3/uL — ABNORMAL HIGH (ref 150–400)
RBC: 3.3 MIL/uL — ABNORMAL LOW (ref 3.87–5.11)
RDW: 16.8 % — ABNORMAL HIGH (ref 11.5–15.5)
WBC: 7.4 10*3/uL (ref 4.0–10.5)
nRBC: 0 % (ref 0.0–0.2)

## 2019-10-15 LAB — GLUCOSE, CAPILLARY
Glucose-Capillary: 100 mg/dL — ABNORMAL HIGH (ref 70–99)
Glucose-Capillary: 105 mg/dL — ABNORMAL HIGH (ref 70–99)
Glucose-Capillary: 118 mg/dL — ABNORMAL HIGH (ref 70–99)
Glucose-Capillary: 121 mg/dL — ABNORMAL HIGH (ref 70–99)
Glucose-Capillary: 141 mg/dL — ABNORMAL HIGH (ref 70–99)
Glucose-Capillary: 142 mg/dL — ABNORMAL HIGH (ref 70–99)

## 2019-10-15 LAB — BRAIN NATRIURETIC PEPTIDE: B Natriuretic Peptide: 168.9 pg/mL — ABNORMAL HIGH (ref 0.0–100.0)

## 2019-10-15 LAB — BASIC METABOLIC PANEL
Anion gap: <3 — ABNORMAL LOW (ref 5–15)
BUN: 12 mg/dL (ref 6–20)
CO2: 21 mmol/L — ABNORMAL LOW (ref 22–32)
Calcium: 8.1 mg/dL — ABNORMAL LOW (ref 8.9–10.3)
Chloride: 117 mmol/L — ABNORMAL HIGH (ref 98–111)
Creatinine, Ser: 0.37 mg/dL — ABNORMAL LOW (ref 0.44–1.00)
GFR calc Af Amer: >60 mL/min (ref >60)
GFR calc non Af Amer: >60 mL/min (ref >60)
Glucose, Bld: 119 mg/dL — ABNORMAL HIGH (ref 70–99)
Potassium: 3.3 mmol/L — ABNORMAL LOW (ref 3.5–5.1)
Sodium: 138 mmol/L (ref 135–145)

## 2019-10-15 LAB — MAGNESIUM: Magnesium: 1.6 mg/dL — ABNORMAL LOW (ref 1.7–2.4)

## 2019-10-15 MED ORDER — SACUBITRIL-VALSARTAN 49-51 MG PO TABS
1.0000 | ORAL_TABLET | Freq: Two times a day (BID) | ORAL | Status: DC
  Administered 2019-10-15 – 2019-10-19 (×9): 1 via ORAL
  Filled 2019-10-15 (×9): qty 1

## 2019-10-15 MED ORDER — MAGNESIUM SULFATE 4 GM/100ML IV SOLN
4.0000 g | Freq: Once | INTRAVENOUS | Status: AC
  Administered 2019-10-15: 4 g via INTRAVENOUS
  Filled 2019-10-15: qty 100

## 2019-10-15 MED ORDER — POTASSIUM CHLORIDE 20 MEQ PO PACK
40.0000 meq | PACK | Freq: Two times a day (BID) | ORAL | Status: AC
  Administered 2019-10-15 (×2): 40 meq via ORAL
  Filled 2019-10-15 (×2): qty 2

## 2019-10-15 NOTE — Progress Notes (Signed)
ADVANCED HF PROGRESS  Patient Name: Beverly [name] Date of Encounter: 10/15/2019  [facility] Cardiologist: No primary care provider on file.   Subjective   Remains awake but not following commands. Seems to track some but not reliably. BP and weight stable  Inpatient Medications    Scheduled Meds: . aspirin  81 mg Per Tube Daily  . atorvastatin  80 mg Per Tube Daily  . carvedilol  6.25 mg Per Tube BID WC  . chlorhexidine gluconate (MEDLINE KIT)  15 mL Mouth Rinse BID  . Chlorhexidine Gluconate Cloth  6 each Topical Daily  . digoxin  0.125 mg Per Tube Daily  . enoxaparin (LOVENOX) injection  40 mg Subcutaneous Q24H  . feeding supplement (PROSource TF)  45 mL Per Tube QID  . insulin aspart  0-15 Units Subcutaneous Q4H  . mouth rinse  15 mL Mouth Rinse 10 times per day  . prenatal multivitamin  1 tablet Per Tube Q1200  . sacubitril-valsartan  1 tablet Per Tube BID  . scopolamine  1 patch Transdermal Q72H  . sodium chloride flush  10-40 mL Intracatheter Q12H  . spironolactone  25 mg Per Tube Daily   Continuous Infusions: . sodium chloride 10 mL/hr at 10/14/19 0700  . ceFEPime (MAXIPIME) IV 2 g (10/15/19 0541)  . feeding supplement (OSMOLITE 1.5 CAL) 1,000 mL (10/14/19 1823)   PRN Meds: acetaminophen (TYLENOL) oral liquid 160 mg/5 mL, diphenoxylate-atropine, hydrALAZINE, sodium chloride flush   Vital Signs    Vitals:   10/15/19 0000 10/15/19 0010 10/15/19 0341 10/15/19 0838  BP:  103/67  115/77  Pulse:  81  80  Resp:  16  19  Temp:  98.5 F (36.9 C)  98.2 F (36.8 C)  TempSrc:  Axillary  Axillary  SpO2: 98% 97% 98% 97%  Weight:      Height:        Intake/Output Summary (Last 24 hours) at 10/15/2019 0843 Last data filed at 10/14/2019 2306 Gross per 24 hour  Intake 129.16 ml  Output 100 ml  Net 29.16 ml   Last 3 Weights 10/11/2019 10/10/2019 10/09/2019  Weight (lbs) 212 lb 4.9 oz 212 lb 15.4 oz 211 lb 10.3 oz  Weight (kg) 96.3 kg 96.6 kg 96 kg  Some encounter  information is confidential and restricted. Go to Review Flowsheets activity to see all data.       Telemetry   SR 80s Personally reviewed   Physical Exam   General:  Awake on trach Not following commands. Tracks some but not reliaby HEENT: normal Neck: supple. no JVD.  + trachCarotids 2+ bilat; no bruits. No lymphadenopathy or thryomegaly appreciated. Cor: PMI nondisplaced. Regular rate & rhythm. No rubs, gallops or murmurs. Lungs: clear Abdomen: obese + PEG soft, nontender, nondistended. No hepatosplenomegaly. No bruits or masses. Good bowel sounds. Extremities: no cyanosis, clubbing, rash, edema Neuro: awake opens eyes. Not following commands   Labs    High Sensitivity Troponin:   Recent Labs  Lab 09/23/19 1539 09/23/19 1816 09/24/19 0039 09/24/19 0826  TROPONINIHS 127* 2,078* 24,421* >27,000*      Chemistry Recent Labs  Lab 10/13/19 0440 10/14/19 0500 10/15/19 0552  NA 136 140 138  K 4.1 3.9 3.3*  CL 102 104 117*  CO2 24 25 21*  GLUCOSE 104* 125* 119*  BUN _0 CREATININE 0.41* 0.41* 0.37*  CALCIUM 9.5 9.8 8.1*  GFRNONAA >60 >60 >60  GFRAA >60 >60 >60  ANIONGAP 10 11 <3*  Hematology Recent Labs  Lab 10/11/19 0440 10/12/19 0656 10/15/19 0552  WBC 10.3 8.9 7.4  RBC 3.62* 3.58* 3.30*  HGB 9.1* 9.0* 8.3*  HCT 31.1* 30.4* 27.6*  MCV 85.9 84.9 83.6  MCH 25.1* 25.1* 25.2*  MCHC 29.3* 29.6* 30.1  RDW 17.5* 17.2* 16.8*  PLT 643* 722* 572*    BNP Recent Labs  Lab 10/13/19 0440 10/14/19 0500 10/15/19 0552  BNP 186.1* 200.4* 168.9*     DDimer No results for input(s): DDIMER in the last 168 hours.   Radiology    No results found.  Cardiac Studies   See section above (reviewed)  Patient Profile     34 y.o. female 2 weeks post-partum with VF/torsades arrest in the setting of anterior MI  Assessment & Plan    1. VF arrest (in-hospital): in setting of anterior infarct. - Rhythm stable unfortunately with severe anoxic brain  injury. (see below) - EEG 8/20 diffuse slowing but improved - Brain MRI normal x 2. EEG still with diffuse slowing - Remains on vent through trach.  - VT quiescent. Keep K> 4.0 Mg > 2.0  2. Acute systolic heart failure: secondary to anterior MI, 09/24/19 LVEF 35%.  - Volume status stable.  - On carvedilol 6.25 bid, Entresto 97-103bid, dig 0.125 and spiro 25 mg daily.  - BP soft. Will cut Entresto to 49/51 for now 3. CAD/Anterior MI: angio reviewed again. Appears to have aneurysmal proximal LAD with dissection, likely culprit. Had TIMI-3 flow at cath but large infarct.  - Continue ASA,  beta-blocker and statin.  - No evidence ischemia currently - Continue medical therapy for now 4. Encephalopathy, post-arrest.Likely anoxic brain injury  - MRI 09/29/19 & 10/07/19 negative.  - EEG 8/31 EEGwith diffuse slowing - Seems slightly improved but still not following commands. Continue to follow for progress 5. Hypokalemia/hypomag -Stable.  6. Hypertension -Stable. 7. Fevers - Afebrile overnight - Abx per TRH 8. SVT on 10/03/19 - Resolved. Continue current dose of carvedilol.   Await LTAC placement     Signed, Glori Bickers, MD  10/15/2019, 8:43 AM

## 2019-10-15 NOTE — Progress Notes (Signed)
PROGRESS NOTE    Beverly [name]  GYJ:[id number] DOB: Oct 30, 1985 DOA: 09/24/2019 PCP: Patient, No Pcp Per     Brief Narrative: Per chart:" 34 y/o female with presented to the hospital for chest pain. She was 2 wks post emergency C section due to PROM. In the ED , her chest pain resolved but she subsequently had a VF arrest (polymorphin V tach and then V fib) with ROSC after defib x 3 and epi x 1. Immediately after arrest ST elevation was noted on EKG.  She was taken to the cath lab at [facility] where an aneurysmal dilatation (dissection?) of the LAD was identified. She was transferred to [facility] for ICU admission. LHC 8/17 >No coronary artery occlusion or critical stenosis. There is aneurysmal dilation of the ostial and proximal LAD with possible ulceration or dissection of uncertain chronicity. TIMI-3 flow is noted throughout the LAD and its branches. Severely reduced left ventricular systolic function with mid/apical anterior, apical, and apical inferior hypokinesis/akinesis. LV gram 30% EF.   Echo 8/18>LVEF 30-35%, septal and apical akinesis distal anterior wall an d mid/distal inferior wall hypokinesis hyperdynamic basal function. LV size mildly dilated.  MRI brain 8/23: Normal  8/30 underwent tracheostomy 8/30 PEG tube placement Continues to be encephalopathic, able to open eyes and track but not following any commands and talking, moving extremities independently  Subjective:  Alert,awake, staring,not following commands, mother at bedside on trach collar Mom says she smiled yesterday and this am she cried but patient appears to have blank look and not following any commands interactive   Assessment & Plan:  V FIb arrest ( in hospital) in the setting of NSTEMI/anterior MI: Edema stable, keep K above 4 and mag above 2.  Cardiology following.  CAD/anterior MI status post cardiac cath appears to have aneurysmal proximal LAD with dissection likely culprit, on aspirin  beta-blocker and statin.  Continue current medical therapy as per cardiology  Acute systolic cardiomyopathy EF 35%: Secondary to anterior MI, euvolemic continue Coreg and Entresto digoxin spironolactone.  Cut down Entresto due to soft BP.  Appreciate cardiology input  Acute anoxic encephalopathy/acute metabolic encephalopathy 2/2 acute cardiac arrest: EEG 8/20 diffuse slowing but improved brain MRI normal x2.  Does not follow any commands eyes are open able to track sound.  Possibly slight improvement but not significantly improved  Hypokalemia/hypomagnesemia: monitor and replete.  Hypertension: blood pressure stable continue Coreg Aldactone  SVT in 8/27 resolved continue Coreg  Low-grade fever: Imaging question aspiration pneumonia, on cefepime since 9/2 with stop date for 9/9.  Watch for temperature curve  Respiratory failure with trach collar in place.  Continue pulmonary hygiene  Thrombocytosis: Monitor, plt peaked 722k> 572k  Diarrhea: On as needed Lomotil.  Hold laxatives for diarrhea.  Rectal tube in place in the stool Output 129m.  DVT prophylaxis: enoxaparin (LOVENOX) injection 40 mg Start: 10/07/19 1000 Place and maintain sequential compression device Start: 09/24/19 0136 Code Status:   Code Status: Full Code  Family Communication: plan of care discussed with patient's mother at bedside. Status is: Inpatient  Remains inpatient appropriate because:IV treatments appropriate due to intensity of illness or inability to take PO and Inpatient level of care appropriate due to severity of illness  Dispo: The patient is from: Home              Anticipated d/c is to: LTAC              Anticipated d/c date is: 3 days  Patient currently is not medically stable to d/c.  Nutrition: Diet Order    None      Nutrition Problem: Increased nutrient needs Etiology: post-op healing Signs/Symptoms: estimated needs Interventions: Tube feeding Body mass index is 30.46  kg/m.  Consultants:see note  Procedures:see note Microbiology:see note Blood Culture    Component Value Date/Time   SDES URINE, RANDOM 10/08/2019 1316   SPECREQUEST NONE 10/08/2019 1316   CULT  10/08/2019 1316    NO GROWTH Performed at [facility] [address], [place], [place] [address]    REPTSTATUS 10/09/2019 FINAL 10/08/2019 1316    Other culture-see note  Medications: Scheduled Meds: . aspirin  81 mg Per Tube Daily  . atorvastatin  80 mg Per Tube Daily  . carvedilol  6.25 mg Per Tube BID WC  . chlorhexidine gluconate (MEDLINE KIT)  15 mL Mouth Rinse BID  . Chlorhexidine Gluconate Cloth  6 each Topical Daily  . digoxin  0.125 mg Per Tube Daily  . enoxaparin (LOVENOX) injection  40 mg Subcutaneous Q24H  . feeding supplement (PROSource TF)  45 mL Per Tube QID  . insulin aspart  0-15 Units Subcutaneous Q4H  . mouth rinse  15 mL Mouth Rinse 10 times per day  . potassium chloride  40 mEq Oral BID  . prenatal multivitamin  1 tablet Per Tube Q1200  . sacubitril-valsartan  1 tablet Oral BID  . scopolamine  1 patch Transdermal Q72H  . sodium chloride flush  10-40 mL Intracatheter Q12H  . spironolactone  25 mg Per Tube Daily   Continuous Infusions: . sodium chloride 10 mL/hr at 10/14/19 0700  . ceFEPime (MAXIPIME) IV 2 g (10/15/19 0541)  . feeding supplement (OSMOLITE 1.5 CAL) 1,000 mL (10/14/19 1823)  . magnesium sulfate bolus IVPB      Antimicrobials: Anti-infectives (From admission, onward)   Start     Dose/Rate Route Frequency Ordered Stop   10/09/19 2100  vancomycin (VANCOREADY) IVPB 1500 mg/300 mL  Status:  Discontinued        1,500 mg 150 mL/hr over 120 Minutes Intravenous Every 12 hours 10/09/19 1237 10/10/19 1121   10/09/19 0800  ceFEPIme (MAXIPIME) 2 g in sodium chloride 0.9 % 100 mL IVPB        2 g 200 mL/hr over 30 Minutes Intravenous Every 8 hours 10/09/19 0752 10/16/19 0559   10/09/19 0800  vancomycin (VANCOREADY) IVPB 2000 mg/400 mL         2,000 mg 200 mL/hr over 120 Minutes Intravenous  Once 10/09/19 0752 10/09/19 1147   10/07/19 1540  ceFAZolin (ANCEF) 2-4 GM/100ML-% IVPB       Note to Pharmacy: [name] Manges   : cabinet override      10/07/19 1540 10/08/19 0344   10/07/19 1400  ceFAZolin (ANCEF) IVPB 2g/100 mL premix        2 g 200 mL/hr over 30 Minutes Intravenous To Radiology 10/06/19 1339 10/07/19 1627   09/30/19 0300  vancomycin (VANCOCIN) IVPB 1000 mg/200 mL premix  Status:  Discontinued        1,000 mg 200 mL/hr over 60 Minutes Intravenous Every 8 hours 09/29/19 1809 10/01/19 0853   09/29/19 1815  ceFEPIme (MAXIPIME) 2 g in sodium chloride 0.9 % 100 mL IVPB        2 g 200 mL/hr over 30 Minutes Intravenous Every 8 hours 09/29/19 1803 10/05/19 1801   09/29/19 1815  vancomycin (VANCOREADY) IVPB 2000 mg/400 mL  2,000 mg 200 mL/hr over 120 Minutes Intravenous  Once 09/29/19 1803 09/29/19 2137   09/24/19 2200  cefTRIAXone (ROCEPHIN) 2 g in sodium chloride 0.9 % 100 mL IVPB        2 g 200 mL/hr over 30 Minutes Intravenous Daily at bedtime 09/24/19 1201 09/28/19 2338   09/24/19 0330  cefTRIAXone (ROCEPHIN) 1 g in sodium chloride 0.9 % 100 mL IVPB  Status:  Discontinued        1 g 200 mL/hr over 30 Minutes Intravenous Daily at bedtime 09/24/19 0245 09/24/19 1201   09/24/19 0300  azithromycin (ZITHROMAX) 500 mg in sodium chloride 0.9 % 250 mL IVPB  Status:  Discontinued        500 mg 250 mL/hr over 60 Minutes Intravenous Daily at bedtime 09/24/19 0245 09/24/19 1103     Objective: Vitals: Today's Vitals   10/15/19 0000 10/15/19 0010 10/15/19 0341 10/15/19 0838  BP:  103/67  115/77  Pulse:  81  80  Resp:  16  19  Temp:  98.5 F (36.9 C)  98.2 F (36.8 C)  TempSrc:  Axillary  Axillary  SpO2: 98% 97% 98% 97%  Weight:      Height:      PainSc:    0-No pain    Intake/Output Summary (Last 24 hours) at 10/15/2019 1013 Last data filed at 10/14/2019 2306 Gross per 24 hour  Intake 129.16 ml  Output 100 ml  Net  29.16 ml   Filed Weights   10/09/19 0342 10/10/19 0339 10/11/19 0443  Weight: 96 kg 96.6 kg 96.3 kg   Weight change:   Intake/Output from previous day: 09/07 0701 - 09/08 0700 In: 129.2 [I.V.:129.2] Out: 100 [Urine:50; Stool:50] Intake/Output this shift: No intake/output data recorded.  Examination: General exam: Alert not following commands, left leg hanging on side. HEENT:Oral mucosa moist, Ear/Nose WNL grossly,dentition normal. Respiratory system: TRACH COLLAR+, bilaterally clear,no wheezing or crackles,no use of accessory muscle, non tender. Cardiovascular system: S1 & S2 +, regular, No JVD. Gastrointestinal system: Abdomen soft,  PEG+,nD, BS+. Nervous System:Alert, awake, moving extremities and grossly nonfocal Extremities: No edema, distal peripheral pulses palpable.  Skin: No rashes,no icterus. MSK: Normal muscle bulk,tone, power  Data Reviewed: I have personally reviewed following labs and imaging studies CBC: Recent Labs  Lab 10/09/19 0346 10/10/19 0350 10/11/19 0440 10/12/19 0656 10/15/19 0552  WBC 13.5* 11.7* 10.3 8.9 7.4  HGB 9.2* 9.0* 9.1* 9.0* 8.3*  HCT 30.9* 30.1* 31.1* 30.4* 27.6*  MCV 85.4 86.7 85.9 84.9 83.6  PLT 671* 665* 643* 722* 161*   Basic Metabolic Panel: Recent Labs  Lab 10/09/19 0346 10/09/19 0346 10/10/19 0350 10/10/19 0350 10/11/19 0440 10/12/19 0656 10/13/19 0440 10/14/19 0500 10/15/19 0552  NA 139   < > 138   < > 137 139 136 140 138  K 4.1   < > 4.1   < > 3.9 4.3 4.1 3.9 3.3*  CL 104   < > 106   < > 105 105 102 104 117*  CO2 25   < > 24   < > 23 26 24 25  21*  GLUCOSE 122*   < > 117*   < > 129* 116* 104* 125* 119*  BUN 13   < > 13   < > 19 19 17 15 12   CREATININE 0.52   < > 0.41*   < > 0.47 0.43* 0.41* 0.41* 0.37*  CALCIUM 9.4   < > 9.5   < > 9.1 9.4 9.5  9.8 8.1*  MG 2.1   < > 1.8   < > 2.1 1.8 1.9 1.8 1.6*  PHOS 4.0  --  4.7*  --  4.3  --   --   --   --    < > = values in this interval not displayed.   GFR: Estimated  Creatinine Clearance: 126.9 mL/min (A) (by C-G formula based on SCr of 0.37 mg/dL (L)). Liver Function Tests: No results for input(s): AST, ALT, ALKPHOS, BILITOT, PROT, ALBUMIN in the last 168 hours. No results for input(s): LIPASE, AMYLASE in the last 168 hours. No results for input(s): AMMONIA in the last 168 hours. Coagulation Profile: No results for input(s): INR, PROTIME in the last 168 hours. Cardiac Enzymes: No results for input(s): CKTOTAL, CKMB, CKMBINDEX, TROPONINI in the last 168 hours. BNP (last 3 results) No results for input(s): PROBNP in the last 8760 hours. HbA1C: No results for input(s): HGBA1C in the last 72 hours. CBG: Recent Labs  Lab 10/14/19 1629 10/14/19 2024 10/15/19 0009 10/15/19 0459 10/15/19 0808  GLUCAP 103* 122* 141* 118* 142*   Lipid Profile: No results for input(s): CHOL, HDL, LDLCALC, TRIG, CHOLHDL, LDLDIRECT in the last 72 hours. Thyroid Function Tests: No results for input(s): TSH, T4TOTAL, FREET4, T3FREE, THYROIDAB in the last 72 hours. Anemia Panel: No results for input(s): VITAMINB12, FOLATE, FERRITIN, TIBC, IRON, RETICCTPCT in the last 72 hours. Sepsis Labs: No results for input(s): PROCALCITON, LATICACIDVEN in the last 168 hours.  Recent Results (from the past 240 hour(s))  Culture, blood (routine x 2)     Status: None   Collection Time: 10/08/19  9:01 AM   Specimen: BLOOD RIGHT HAND  Result Value Ref Range Status   Specimen Description BLOOD RIGHT HAND  Final   Special Requests   Final    BOTTLES DRAWN AEROBIC ONLY Blood Culture adequate volume   Culture   Final    NO GROWTH 5 DAYS Performed at [facility], [address] [place], [place] [address]    Report Status 10/13/2019 FINAL  Final  Culture, blood (routine x 2)     Status: None   Collection Time: 10/08/19  9:03 AM   Specimen: BLOOD RIGHT HAND  Result Value Ref Range Status   Specimen Description BLOOD RIGHT HAND  Final   Special Requests   Final    BOTTLES DRAWN  AEROBIC ONLY Blood Culture adequate volume   Culture   Final    NO GROWTH 5 DAYS Performed at [facility], [place] [address], [place], [place] [address]    Report Status 10/13/2019 FINAL  Final  Culture, respiratory (non-expectorated)     Status: None   Collection Time: 10/08/19 10:55 AM   Specimen: Tracheal Aspirate; Respiratory  Result Value Ref Range Status   Specimen Description TRACHEAL ASPIRATE  Final   Special Requests NONE  Final   Gram Stain   Final    FEW WBC PRESENT, PREDOMINANTLY PMN RARE GRAM NEGATIVE RODS RARE GRAM POSITIVE COCCI IN CHAINS Performed at [facility], [place] [address] [place],  [address]    Culture   Final    FEW STAPHYLOCOCCUS AUREUS RARE ENTEROBACTER AEROGENES    Report Status 10/10/2019 FINAL  Final   Organism ID, Bacteria STAPHYLOCOCCUS AUREUS  Final   Organism ID, Bacteria ENTEROBACTER AEROGENES  Final      Susceptibility   Enterobacter aerogenes - MIC*    CEFAZOLIN >=64 RESISTANT Resistant     CEFEPIME <=0.12  SENSITIVE Sensitive     CEFTAZIDIME 4 SENSITIVE Sensitive     CEFTRIAXONE 2 INTERMEDIATE Intermediate     CIPROFLOXACIN <=0.25 SENSITIVE Sensitive     GENTAMICIN <=1 SENSITIVE Sensitive     IMIPENEM 1 SENSITIVE Sensitive     TRIMETH/SULFA <=20 SENSITIVE Sensitive     PIP/TAZO <=4 SENSITIVE Sensitive     * RARE ENTEROBACTER AEROGENES   Staphylococcus aureus - MIC*    CIPROFLOXACIN <=0.5 SENSITIVE Sensitive     ERYTHROMYCIN RESISTANT Resistant     GENTAMICIN <=0.5 SENSITIVE Sensitive     OXACILLIN <=0.25 SENSITIVE Sensitive     TETRACYCLINE <=1 SENSITIVE Sensitive     VANCOMYCIN 1 SENSITIVE Sensitive     TRIMETH/SULFA <=10 SENSITIVE Sensitive     CLINDAMYCIN RESISTANT Resistant     RIFAMPIN <=0.5 SENSITIVE Sensitive     Inducible Clindamycin POSITIVE Resistant     * FEW STAPHYLOCOCCUS AUREUS  Culture, Urine     Status: None   Collection Time: 10/08/19  1:16 PM   Specimen: Urine, Random  Result Value Ref Range  Status   Specimen Description URINE, RANDOM  Final   Special Requests NONE  Final   Culture   Final    NO GROWTH Performed at [facility], [place] [address], [place], [place] [address]    Report Status 10/09/2019 FINAL  Final     Radiology Studies: No results found.   LOS: 21 days   Antonieta Pert, MD [facility]  10/15/2019, 10:13 AM

## 2019-10-16 LAB — BASIC METABOLIC PANEL
Anion gap: 11 (ref 5–15)
BUN: 15 mg/dL (ref 6–20)
CO2: 24 mmol/L (ref 22–32)
Calcium: 9.8 mg/dL (ref 8.9–10.3)
Chloride: 102 mmol/L (ref 98–111)
Creatinine, Ser: 0.44 mg/dL (ref 0.44–1.00)
GFR calc Af Amer: >60 mL/min (ref >60)
GFR calc non Af Amer: >60 mL/min (ref >60)
Glucose, Bld: 142 mg/dL — ABNORMAL HIGH (ref 70–99)
Potassium: 4.5 mmol/L (ref 3.5–5.1)
Sodium: 137 mmol/L (ref 135–145)

## 2019-10-16 LAB — GLUCOSE, CAPILLARY
Glucose-Capillary: 103 mg/dL — ABNORMAL HIGH (ref 70–99)
Glucose-Capillary: 107 mg/dL — ABNORMAL HIGH (ref 70–99)
Glucose-Capillary: 112 mg/dL — ABNORMAL HIGH (ref 70–99)
Glucose-Capillary: 118 mg/dL — ABNORMAL HIGH (ref 70–99)
Glucose-Capillary: 124 mg/dL — ABNORMAL HIGH (ref 70–99)
Glucose-Capillary: 132 mg/dL — ABNORMAL HIGH (ref 70–99)
Glucose-Capillary: 96 mg/dL (ref 70–99)
Glucose-Capillary: 96 mg/dL (ref 70–99)

## 2019-10-16 NOTE — Progress Notes (Signed)
  Speech Language Pathology Treatment: Beverly [name] Speaking valve  Patient Details Name: Beverly [name] MRN: [id number] DOB: 06/24/1985 Today's Date: 10/16/2019 Time: 3545-6256 SLP Time Calculation (min) (ACUTE ONLY): 10 min  Assessment / Plan / Recommendation Clinical Impression  Pt was seen for treatment and was alert throughout the session. [name] cuff was deflated upon SLP's entry and vitals were RR18, SpO2 98% and HR 102 at baseline. Pt tolerated multiple trials of finger occlusion without evidence of air trapping and exhibited throat clearing during trials with subsequent coughing and expectoration of clear secretions from the trach. Assistance was requested from nurse tech for repositioning of the pt in the bed prior to applying PMSV. During repositioning pt's gown and sheets were observed to be wet and it was discovered the the pt's purewick was not in place. The session was terminated prematurely to allow her to be changed and cleaned by nurse tech. On 9/7 SLP recommended that pt's trach be downsized to #4 and changed to a cuffless. SLP will continue to follow pt.    HPI HPI: Pt is a 34 y.o. female with PMH of HTN who was admitted 09/23/19 2 weeks post partum after emergent C section due to rupture of membranes with VF arrest. Pt with enterobacter PNA and encephalopathy, trach placed 8/30 and PEG on 8/31. MRI 8/31: No evidence of anoxic brain injury.      SLP Plan  Continue with current plan of care       Recommendations         Patient may use Passy-Muir Speech Valve: with SLP only PMSV Supervision: Full MD: Please consider changing trach tube to : Cuffless;Smaller size (#4 cuffless)         Oral Care Recommendations: Oral care QID Follow up Recommendations: LTACH SLP Visit Diagnosis: Aphonia (R49.1) Plan: Continue with current plan of care       Beverly [name] I. Vear Clock, MS, CCC-SLP Acute Rehabilitation Services Office number [phone] Pager [phone]                 Beverly [name] 10/16/2019, 5:15 PM

## 2019-10-16 NOTE — Progress Notes (Signed)
PROGRESS NOTE    Beverly [name]  TMH:[id number] DOB: Mar 25, 1985 DOA: 09/24/2019 PCP: Patient, No Pcp Per     Brief Narrative: Per chart:" 33 y/o female with presented to the hospital for chest pain. She was 2 wks post emergency C section due to PROM. In the ED , her chest pain resolved but she subsequently had a VF arrest (polymorphin V tach and then V fib) with ROSC after defib x 3 and epi x 1. Immediately after arrest ST elevation was noted on EKG.  She was taken to the cath lab at [facility] where an aneurysmal dilatation (dissection?) of the LAD was identified. She was transferred to [facility] for ICU admission. LHC 8/17 >No coronary artery occlusion or critical stenosis. There is aneurysmal dilation of the ostial and proximal LAD with possible ulceration or dissection of uncertain chronicity. TIMI-3 flow is noted throughout the LAD and its branches. Severely reduced left ventricular systolic function with mid/apical anterior, apical, and apical inferior hypokinesis/akinesis. LV gram 30% EF.   Echo 8/18>LVEF 30-35%, septal and apical akinesis distal anterior wall an d mid/distal inferior wall hypokinesis hyperdynamic basal function. LV size mildly dilated.  MRI brain 8/23: Normal  8/30 underwent tracheostomy 8/30 PEG tube placement Continues to be encephalopathic, able to open eyes and track but not following any commands and talking, moving extremities independently  Subjective: No family at bedside Alert, able to track at times No acute events overnight. On trach collar at 5 L, saturating well,no fever.  Assessment & Plan:  V FIb arrest (in hospital) in the setting of NSTEMI/anterior MI: rhythm stable. keep K above 4 and mag above 2.  Cardiology following.  On trach collar, peg feeding.  CAD/anterior MI status post cardiac cath appears to have aneurysmal proximal LAD with dissection likely culprit, on aspirin beta-blocker and statin.  Continue current medical with  aspirin, Coreg, Lipitor.   Acute systolic congestive heart failure with EF 35%: Secondary to anterior MI.  Currently euvolemic, continue on medical therapy with Coreg and Entresto digoxin spironolactone. Cardio has cut down Entresto due to soft BP.  Appreciate cardiology input  Acute anoxic encephalopathy/acute metabolic encephalopathy 2/2 V fib cardiac arrest: EEG 8/20 diffuse slowing but improved,  brain MRI normal x2.  Does not follow any commands eyes are open able to track some.  Is trach dependent and also PEG dependent.  Hypokalemia/hypomagnesemia: Improved  Hypertension: blood pressure stable in 100-120. Cont coreg Aldactone entresto.  SVT in 8/27 resolved continue Coreg  Low-grade fever: Imaging question aspiration pneumonia, on cefepime since 9/2 with stop date for 9/9.  Has been afebrile.  Respiratory failure with trach collar in place.  Continue pulmonary hygiene, continue respiratory support.  Thrombocytosis: Monitor, plt peaked 722k> 572k  Diarrhea: On as needed Lomotil.  Hold laxatives for diarrhea.  Rectal tube in place with stool Output.  DVT prophylaxis: enoxaparin (LOVENOX) injection 40 mg Start: 10/07/19 1000 Place and maintain sequential compression device Start: 09/24/19 0136 Code Status:   Code Status: Full Code  Family Communication: plan of care discussed with patient's mother at bedside. Status is: Inpatient  Remains inpatient appropriate because:IV treatments appropriate due to intensity of illness or inability to take PO and Inpatient level of care appropriate due to severity of illness  Dispo: The patient is from: Home              Anticipated d/c is to: LTAC              Anticipated d/c  date is: Once bed available              Patient currently medically stable for LTAC placement  Nutrition: Diet Order    None      Nutrition Problem: Increased nutrient needs Etiology: post-op healing Signs/Symptoms: estimated needs Interventions: Tube  feeding Body mass index is 30.46 kg/m.  Consultants:see note  Procedures:see note Microbiology:see note Blood Culture    Component Value Date/Time   SDES URINE, RANDOM 10/08/2019 1316   SPECREQUEST NONE 10/08/2019 1316   CULT  10/08/2019 1316    NO GROWTH Performed at [facility] [address] [place], [place] [address]    REPTSTATUS 10/09/2019 FINAL 10/08/2019 1316    Other culture-see note  Medications: Scheduled Meds: . aspirin  81 mg Per Tube Daily  . atorvastatin  80 mg Per Tube Daily  . carvedilol  6.25 mg Per Tube BID WC  . chlorhexidine gluconate (MEDLINE KIT)  15 mL Mouth Rinse BID  . Chlorhexidine Gluconate Cloth  6 each Topical Daily  . digoxin  0.125 mg Per Tube Daily  . enoxaparin (LOVENOX) injection  40 mg Subcutaneous Q24H  . feeding supplement (PROSource TF)  45 mL Per Tube QID  . insulin aspart  0-15 Units Subcutaneous Q4H  . mouth rinse  15 mL Mouth Rinse 10 times per day  . prenatal multivitamin  1 tablet Per Tube Q1200  . sacubitril-valsartan  1 tablet Oral BID  . scopolamine  1 patch Transdermal Q72H  . sodium chloride flush  10-40 mL Intracatheter Q12H  . spironolactone  25 mg Per Tube Daily   Continuous Infusions: . sodium chloride 10 mL/hr at 10/14/19 0700  . feeding supplement (OSMOLITE 1.5 CAL) 1,000 mL (10/15/19 1109)    Antimicrobials: Anti-infectives (From admission, onward)   Start     Dose/Rate Route Frequency Ordered Stop   10/09/19 2100  vancomycin (VANCOREADY) IVPB 1500 mg/300 mL  Status:  Discontinued        1,500 mg 150 mL/hr over 120 Minutes Intravenous Every 12 hours 10/09/19 1237 10/10/19 1121   10/09/19 0800  ceFEPIme (MAXIPIME) 2 g in sodium chloride 0.9 % 100 mL IVPB        2 g 200 mL/hr over 30 Minutes Intravenous Every 8 hours 10/09/19 0752 10/15/19 2147   10/09/19 0800  vancomycin (VANCOREADY) IVPB 2000 mg/400 mL        2,000 mg 200 mL/hr over 120 Minutes Intravenous  Once 10/09/19 0752 10/09/19 1147    10/07/19 1540  ceFAZolin (ANCEF) 2-4 GM/100ML-% IVPB       Note to Pharmacy: [name] Manges   : cabinet override      10/07/19 1540 10/08/19 0344   10/07/19 1400  ceFAZolin (ANCEF) IVPB 2g/100 mL premix        2 g 200 mL/hr over 30 Minutes Intravenous To Radiology 10/06/19 1339 10/07/19 1627   09/30/19 0300  vancomycin (VANCOCIN) IVPB 1000 mg/200 mL premix  Status:  Discontinued        1,000 mg 200 mL/hr over 60 Minutes Intravenous Every 8 hours 09/29/19 1809 10/01/19 0853   09/29/19 1815  ceFEPIme (MAXIPIME) 2 g in sodium chloride 0.9 % 100 mL IVPB        2 g 200 mL/hr over 30 Minutes Intravenous Every 8 hours 09/29/19 1803 10/05/19 1801   09/29/19 1815  vancomycin (VANCOREADY) IVPB 2000 mg/400 mL        2,000 mg 200 mL/hr over 120 Minutes Intravenous  Once 09/29/19 1803 09/29/19 2137   09/24/19 2200  cefTRIAXone (ROCEPHIN) 2 g in sodium chloride 0.9 % 100 mL IVPB        2 g 200 mL/hr over 30 Minutes Intravenous Daily at bedtime 09/24/19 1201 09/28/19 2338   09/24/19 0330  cefTRIAXone (ROCEPHIN) 1 g in sodium chloride 0.9 % 100 mL IVPB  Status:  Discontinued        1 g 200 mL/hr over 30 Minutes Intravenous Daily at bedtime 09/24/19 0245 09/24/19 1201   09/24/19 0300  azithromycin (ZITHROMAX) 500 mg in sodium chloride 0.9 % 250 mL IVPB  Status:  Discontinued        500 mg 250 mL/hr over 60 Minutes Intravenous Daily at bedtime 09/24/19 0245 09/24/19 1103     Objective: Vitals: Today's Vitals   10/16/19 0756 10/16/19 0850 10/16/19 1128 10/16/19 1230  BP: 101/67  102/64   Pulse: 92 80 85 89  Resp: (!) 21 19 16 19   Temp: 98.3 F (36.8 C)  98.4 F (36.9 C)   TempSrc: Oral  Axillary   SpO2: 97% 97% 98% 97%  Weight:      Height:      PainSc:       No intake or output data in the 24 hours ending 10/16/19 1414 Filed Weights   10/09/19 0342 10/10/19 0339 10/11/19 0443  Weight: 96 kg 96.6 kg 96.3 kg   Weight change:   Intake/Output from previous day: No intake/output data  recorded. Intake/Output this shift: No intake/output data recorded.  Examination:  General exam: AA, not communicative.  Not following commands on trach collar HEENT:Oral mucosa moist, Ear/Nose WNL grossly, dentition normal. Respiratory system: bilaterally clear,no wheezing or crackles,no use of accessory muscle Cardiovascular system: S1 & S2 +, No JVD,. Gastrointestinal system: Abdomen soft, NT,ND, BS+. PEG+ Nervous System:Alert, awake, spontaneously moving. Extremities: No edema, distal peripheral pulses palpable.  Skin: No rashes,no icterus. MSK: Normal muscle bulk,tone, power   Data Reviewed: I have personally reviewed following labs and imaging studies CBC: Recent Labs  Lab 10/10/19 0350 10/11/19 0440 10/12/19 0656 10/15/19 0552  WBC 11.7* 10.3 8.9 7.4  HGB 9.0* 9.1* 9.0* 8.3*  HCT 30.1* 31.1* 30.4* 27.6*  MCV 86.7 85.9 84.9 83.6  PLT 665* 643* 722* 099*   Basic Metabolic Panel: Recent Labs  Lab 10/10/19 0350 10/10/19 0350 10/11/19 0440 10/11/19 0440 10/12/19 0656 10/13/19 0440 10/14/19 0500 10/15/19 0552 10/16/19 0408  NA 138   < > 137   < > 139 136 140 138 137  K 4.1   < > 3.9   < > 4.3 4.1 3.9 3.3* 4.5  CL 106   < > 105   < > 105 102 104 117* 102  CO2 24   < > 23   < > 26 24 25  21* 24  GLUCOSE 117*   < > 129*   < > 116* 104* 125* 119* 142*  BUN 13   < > 19   < > 19 17 15 12 15   CREATININE 0.41*   < > 0.47   < > 0.43* 0.41* 0.41* 0.37* 0.44  CALCIUM 9.5   < > 9.1   < > 9.4 9.5 9.8 8.1* 9.8  MG 1.8   < > 2.1  --  1.8 1.9 1.8 1.6*  --   PHOS 4.7*  --  4.3  --   --   --   --   --   --    < > =  values in this interval not displayed.   GFR: Estimated Creatinine Clearance: 126.9 mL/min (by C-G formula based on SCr of 0.44 mg/dL). Liver Function Tests: No results for input(s): AST, ALT, ALKPHOS, BILITOT, PROT, ALBUMIN in the last 168 hours. No results for input(s): LIPASE, AMYLASE in the last 168 hours. No results for input(s): AMMONIA in the last 168  hours. Coagulation Profile: No results for input(s): INR, PROTIME in the last 168 hours. Cardiac Enzymes: No results for input(s): CKTOTAL, CKMB, CKMBINDEX, TROPONINI in the last 168 hours. BNP (last 3 results) No results for input(s): PROBNP in the last 8760 hours. HbA1C: No results for input(s): HGBA1C in the last 72 hours. CBG: Recent Labs  Lab 10/16/19 0014 10/16/19 0352 10/16/19 0606 10/16/19 0758 10/16/19 1051  GLUCAP 124* 132* 103* 118* 96   Lipid Profile: No results for input(s): CHOL, HDL, LDLCALC, TRIG, CHOLHDL, LDLDIRECT in the last 72 hours. Thyroid Function Tests: No results for input(s): TSH, T4TOTAL, FREET4, T3FREE, THYROIDAB in the last 72 hours. Anemia Panel: No results for input(s): VITAMINB12, FOLATE, FERRITIN, TIBC, IRON, RETICCTPCT in the last 72 hours. Sepsis Labs: No results for input(s): PROCALCITON, LATICACIDVEN in the last 168 hours.  Recent Results (from the past 240 hour(s))  Culture, blood (routine x 2)     Status: None   Collection Time: 10/08/19  9:01 AM   Specimen: BLOOD RIGHT HAND  Result Value Ref Range Status   Specimen Description BLOOD RIGHT HAND  Final   Special Requests   Final    BOTTLES DRAWN AEROBIC ONLY Blood Culture adequate volume   Culture   Final    NO GROWTH 5 DAYS Performed at [facility], [address], [place], [place] [address]    Report Status 10/13/2019 FINAL  Final  Culture, blood (routine x 2)     Status: None   Collection Time: 10/08/19  9:03 AM   Specimen: BLOOD RIGHT HAND  Result Value Ref Range Status   Specimen Description BLOOD RIGHT HAND  Final   Special Requests   Final    BOTTLES DRAWN AEROBIC ONLY Blood Culture adequate volume   Culture   Final    NO GROWTH 5 DAYS Performed at [facility], [place] [address] [place], [place] [address]    Report Status 10/13/2019 FINAL  Final  Culture, respiratory (non-expectorated)     Status: None   Collection Time: 10/08/19 10:55 AM   Specimen:  Tracheal Aspirate; Respiratory  Result Value Ref Range Status   Specimen Description TRACHEAL ASPIRATE  Final   Special Requests NONE  Final   Gram Stain   Final    FEW WBC PRESENT, PREDOMINANTLY PMN RARE GRAM NEGATIVE RODS RARE GRAM POSITIVE COCCI IN CHAINS Performed at [facility], [place] [address], [place], [place] [address]    Culture   Final    FEW STAPHYLOCOCCUS AUREUS RARE ENTEROBACTER AEROGENES    Report Status 10/10/2019 FINAL  Final   Organism ID, Bacteria STAPHYLOCOCCUS AUREUS  Final   Organism ID, Bacteria ENTEROBACTER AEROGENES  Final      Susceptibility   Enterobacter aerogenes - MIC*    CEFAZOLIN >=64 RESISTANT Resistant     CEFEPIME <=0.12 SENSITIVE Sensitive     CEFTAZIDIME 4 SENSITIVE Sensitive     CEFTRIAXONE 2 INTERMEDIATE Intermediate     CIPROFLOXACIN <=0.25 SENSITIVE Sensitive     GENTAMICIN <=1 SENSITIVE Sensitive     IMIPENEM 1 SENSITIVE Sensitive     TRIMETH/SULFA <=20 SENSITIVE  Sensitive     PIP/TAZO <=4 SENSITIVE Sensitive     * RARE ENTEROBACTER AEROGENES   Staphylococcus aureus - MIC*    CIPROFLOXACIN <=0.5 SENSITIVE Sensitive     ERYTHROMYCIN RESISTANT Resistant     GENTAMICIN <=0.5 SENSITIVE Sensitive     OXACILLIN <=0.25 SENSITIVE Sensitive     TETRACYCLINE <=1 SENSITIVE Sensitive     VANCOMYCIN 1 SENSITIVE Sensitive     TRIMETH/SULFA <=10 SENSITIVE Sensitive     CLINDAMYCIN RESISTANT Resistant     RIFAMPIN <=0.5 SENSITIVE Sensitive     Inducible Clindamycin POSITIVE Resistant     * FEW STAPHYLOCOCCUS AUREUS  Culture, Urine     Status: None   Collection Time: 10/08/19  1:16 PM   Specimen: Urine, Random  Result Value Ref Range Status   Specimen Description URINE, RANDOM  Final   Special Requests NONE  Final   Culture   Final    NO GROWTH Performed at [facility], [place] [address] [place], [place] [address]    Report Status 10/09/2019 FINAL  Final     Radiology Studies: No results found.   LOS: 22 days   Antonieta Pert,  MD [facility]  10/16/2019, 2:14 PM

## 2019-10-16 NOTE — Progress Notes (Signed)
Physical Therapy Treatment Patient Details Name: Beverly [name] MRN: [id number] DOB: 04-Jul-1985 Today's Date: 10/16/2019    History of Present Illness Pt is 34 yo admitted 09/23/19 2 weeks post partum after emergent C section due to rupture of membranes with VF arrest. Pt with enterobacter PNA, trach 8/30, PEG, encephalopathy. PMHx: HTN    PT Comments    Pt with response to auditory stimuli on arrival with eye opening and visual tracking in room. No command following, no active movement of bil UE/LE with PROM performed all extremities. Pt with no trunk activation or righting reaction in sitting. Pt with very limited improvement in visual tracking and will continue to work to stimulate pt and improve strength and function.     Follow Up Recommendations  LTACH;SNF;Supervision/Assistance - 24 hour     Equipment Recommendations  Wheelchair (measurements PT);Hospital bed    Recommendations for Other Services       Precautions / Restrictions Precautions Precautions: Fall Precaution Comments: flexi, bil prevalon, trach, PEG    Mobility  Bed Mobility Overal bed mobility: Needs Assistance Bed Mobility: Rolling Rolling: Total assist         General bed mobility comments: Total A to facilitate reach of UEs during rolling and bring hips over. In sitting total assist to bring trunk forward off of surface without righting reaction or assist with lack of neck extension/control  Transfers                 General transfer comment: not appropriate to attempt   Ambulation/Gait                 Stairs             Wheelchair Mobility    Modified Rankin (Stroke Patients Only)       Balance     Sitting balance-Leahy Scale: Zero                                      Cognition Arousal/Alertness: Awake/alert Behavior During Therapy: Flat affect Overall Cognitive Status: Impaired/Different from baseline                                  General Comments: pt opens eyes on arrival to auditory stimuli. Pt with visual fixation with tracking therapist and movement in room grossly 75% of the time but not to command      Exercises General Exercises - Upper Extremity Shoulder Flexion: PROM;Both;10 reps;Supine Elbow Flexion: PROM;Both;10 reps;Seated General Exercises - Lower Extremity Ankle Circles/Pumps: PROM;Both;10 reps;Supine Heel Slides: PROM;Both;10 reps;Supine Hip ABduction/ADduction: PROM;Both;10 reps;Supine    General Comments        Pertinent Vitals/Pain Pain Assessment: No/denies pain    Home Living                      Prior Function            PT Goals (current goals can now be found in the care plan section) Progress towards PT goals: Not progressing toward goals - comment    Frequency    Min 2X/week      PT Plan Current plan remains appropriate    Co-evaluation              AM-PAC PT "6 Clicks" Mobility   Outcome Measure  Help needed turning from  your back to your side while in a flat bed without using bedrails?: Total Help needed moving from lying on your back to sitting on the side of a flat bed without using bedrails?: Total Help needed moving to and from a bed to a chair (including a wheelchair)?: Total Help needed standing up from a chair using your arms (e.g., wheelchair or bedside chair)?: Total Help needed to walk in hospital room?: Total Help needed climbing 3-5 steps with a railing? : Total 6 Click Score: 6    End of Session Equipment Utilized During Treatment: Oxygen Activity Tolerance: Patient tolerated treatment well Patient left: in bed;with bed alarm set Nurse Communication: Mobility status;Need for lift equipment PT Visit Diagnosis: Other abnormalities of gait and mobility (R26.89);Difficulty in walking, not elsewhere classified (R26.2);Other symptoms and signs involving the nervous system (R29.898)     Time: 9702-6378 PT Time Calculation (min)  (ACUTE ONLY): 24 min  Charges:  $Therapeutic Exercise: 8-22 mins $Therapeutic Activity: 8-22 mins                     Gwin Eagon P, PT Acute Rehabilitation Services Pager: [phone] Office: [phone]    [name] [name] 10/16/2019, 1:41 PM

## 2019-10-17 LAB — GLUCOSE, CAPILLARY
Glucose-Capillary: 107 mg/dL — ABNORMAL HIGH (ref 70–99)
Glucose-Capillary: 118 mg/dL — ABNORMAL HIGH (ref 70–99)
Glucose-Capillary: 124 mg/dL — ABNORMAL HIGH (ref 70–99)
Glucose-Capillary: 135 mg/dL — ABNORMAL HIGH (ref 70–99)
Glucose-Capillary: 138 mg/dL — ABNORMAL HIGH (ref 70–99)
Glucose-Capillary: 94 mg/dL (ref 70–99)

## 2019-10-17 LAB — BASIC METABOLIC PANEL
Anion gap: 12 (ref 5–15)
BUN: 18 mg/dL (ref 6–20)
CO2: 25 mmol/L (ref 22–32)
Calcium: 10.2 mg/dL (ref 8.9–10.3)
Chloride: 101 mmol/L (ref 98–111)
Creatinine, Ser: 0.42 mg/dL — ABNORMAL LOW (ref 0.44–1.00)
GFR calc Af Amer: >60 mL/min (ref >60)
GFR calc non Af Amer: >60 mL/min (ref >60)
Glucose, Bld: 129 mg/dL — ABNORMAL HIGH (ref 70–99)
Potassium: 4.9 mmol/L (ref 3.5–5.1)
Sodium: 138 mmol/L (ref 135–145)

## 2019-10-17 NOTE — Progress Notes (Signed)
PROGRESS NOTE    Beverly [name]  MWN:[id number] DOB: Aug 24, 1985 DOA: 09/24/2019 PCP: Patient, No Pcp Per     Brief Narrative: Per chart:" 34 y/o female with presented to the hospital for chest pain. She was 2 wks post emergency C section due to PROM. In the ED , her chest pain resolved but she subsequently had a VF arrest (polymorphin V tach and then V fib) with ROSC after defib x 3 and epi x 1. Immediately after arrest ST elevation was noted on EKG.  She was taken to the cath lab at [facility] where an aneurysmal dilatation (dissection?) of the LAD was identified. She was transferred to [facility] for ICU admission. LHC 8/17 >No coronary artery occlusion or critical stenosis. There is aneurysmal dilation of the ostial and proximal LAD with possible ulceration or dissection of uncertain chronicity. TIMI-3 flow is noted throughout the LAD and its branches. Severely reduced left ventricular systolic function with mid/apical anterior, apical, and apical inferior hypokinesis/akinesis. LV gram 30% EF.   Echo 8/18>LVEF 30-35%, septal and apical akinesis distal anterior wall an d mid/distal inferior wall hypokinesis hyperdynamic basal function. LV size mildly dilated.  MRI brain 8/23: Normal  8/30 underwent tracheostomy 8/30 PEG tube placement Continues to be encephalopathic, able to open eyes and track but not following any commands and talking, moving extremities independently  Subjective:  No acute events overnight.  Afebrile. On trach collar intermittently mildly tachycardic  Assessment & Plan:  V FIb arrest (in hospital) in the setting of NSTEMI/anterior MI: No new changes.  Stable.  Monitor electrolytes intermittently.  Cardiology following.  Marland Kitchen  CAD/anterior MI status post cardiac cath appears to have aneurysmal proximal LAD with dissection likely culprit.  As per cardiology cont on current medical therapy with aspirin 81 mg, Lipitor 80 mg, Coreg.   Acute systolic congestive  heart failure with EF 35%: Secondary to anterior MI.  Overall euvolemic.  Continue with Coreg and Entresto digoxin spironolactone. Cardio has cut down Entresto due to soft BP.  Appreciate cardiology input.Wt at 212 lb, stable. On admit 227-230 lb.  Acute anoxic encephalopathy/acute metabolic encephalopathy 2/2 V fib cardiac arrest: EEG 8/20 diffuse slowing but improved,  brain MRI normal x2.  Nursing reports patient sometimes tearful looking at her family pictures.  See this morning tracking with eyes but does not follow any other commands.  Remains trach dependent, PEG tube dependent.   Hypokalemia/hypomagnesemia: Improved  Hypertension: Blood pressure well controlled in 100-1 20s. Cont coreg Aldactone entresto.  SVT in 8/27 - NSR. continue Coreg  Low-grade fever: Imaging question aspiration pneumonia, on cefepime started 9/2 -stopped  9/9.  Has been afebrile.  Respiratory failure with trach collar in place.  Continue pulmonary hygiene, continue respiratory support.  Thrombocytosis: Monitor, plt peaked 722k> 572k repeat CBC in a.m.  Diarrhea: Rectal tube with loose stool, on tube feed.  Hold laxatives.  Continue as needed Imodium.   DVT prophylaxis: enoxaparin (LOVENOX) injection 40 mg Start: 10/07/19 1000 Place and maintain sequential compression device Start: 09/24/19 0136 Code Status:   Code Status: Full Code  Family Communication: plan of care discussed with patient's mother at bedside previously and now no family at bedside. Status is: Inpatient  Remains inpatient appropriate because:IV treatments appropriate due to intensity of illness or inability to take PO and Inpatient level of care appropriate due to severity of illness  Dispo: The patient is from: Home              Anticipated  d/c is to: LTAC vs SNF- wiil need trach in place for at least 30 days prior to going to SNF.              Anticipated d/c date is: Once bed available              Patient currently medically stable for  LTAC placement  Nutrition: Diet Order    None      Nutrition Problem: Increased nutrient needs Etiology: post-op healing Signs/Symptoms: estimated needs Interventions: Tube feeding Body mass index is 30.46 kg/m.  Consultants:see note  Procedures:see note Microbiology:see note Blood Culture    Component Value Date/Time   SDES URINE, RANDOM 10/08/2019 1316   SPECREQUEST NONE 10/08/2019 1316   CULT  10/08/2019 1316    NO GROWTH Performed at [facility] [address] [place], [place] [address]    REPTSTATUS 10/09/2019 FINAL 10/08/2019 1316    Other culture-see note  Medications: Scheduled Meds: . aspirin  81 mg Per Tube Daily  . atorvastatin  80 mg Per Tube Daily  . carvedilol  6.25 mg Per Tube BID WC  . chlorhexidine gluconate (MEDLINE KIT)  15 mL Mouth Rinse BID  . Chlorhexidine Gluconate Cloth  6 each Topical Daily  . digoxin  0.125 mg Per Tube Daily  . enoxaparin (LOVENOX) injection  40 mg Subcutaneous Q24H  . feeding supplement (PROSource TF)  45 mL Per Tube QID  . insulin aspart  0-15 Units Subcutaneous Q4H  . mouth rinse  15 mL Mouth Rinse 10 times per day  . prenatal multivitamin  1 tablet Per Tube Q1200  . sacubitril-valsartan  1 tablet Oral BID  . scopolamine  1 patch Transdermal Q72H  . sodium chloride flush  10-40 mL Intracatheter Q12H  . spironolactone  25 mg Per Tube Daily   Continuous Infusions: . sodium chloride 10 mL/hr at 10/16/19 1600  . feeding supplement (OSMOLITE 1.5 CAL) 1,000 mL (10/15/19 1109)    Antimicrobials: Anti-infectives (From admission, onward)   Start     Dose/Rate Route Frequency Ordered Stop   10/09/19 2100  vancomycin (VANCOREADY) IVPB 1500 mg/300 mL  Status:  Discontinued        1,500 mg 150 mL/hr over 120 Minutes Intravenous Every 12 hours 10/09/19 1237 10/10/19 1121   10/09/19 0800  ceFEPIme (MAXIPIME) 2 g in sodium chloride 0.9 % 100 mL IVPB        2 g 200 mL/hr over 30 Minutes Intravenous Every 8 hours  10/09/19 0752 10/15/19 2147   10/09/19 0800  vancomycin (VANCOREADY) IVPB 2000 mg/400 mL        2,000 mg 200 mL/hr over 120 Minutes Intravenous  Once 10/09/19 0752 10/09/19 1147   10/07/19 1540  ceFAZolin (ANCEF) 2-4 GM/100ML-% IVPB       Note to Pharmacy: [name] Manges   : cabinet override      10/07/19 1540 10/08/19 0344   10/07/19 1400  ceFAZolin (ANCEF) IVPB 2g/100 mL premix        2 g 200 mL/hr over 30 Minutes Intravenous To Radiology 10/06/19 1339 10/07/19 1627   09/30/19 0300  vancomycin (VANCOCIN) IVPB 1000 mg/200 mL premix  Status:  Discontinued        1,000 mg 200 mL/hr over 60 Minutes Intravenous Every 8 hours 09/29/19 1809 10/01/19 0853   09/29/19 1815  ceFEPIme (MAXIPIME) 2 g in sodium chloride 0.9 % 100 mL IVPB        2 g 200 mL/hr over 30  Minutes Intravenous Every 8 hours 09/29/19 1803 10/05/19 1801   09/29/19 1815  vancomycin (VANCOREADY) IVPB 2000 mg/400 mL        2,000 mg 200 mL/hr over 120 Minutes Intravenous  Once 09/29/19 1803 09/29/19 2137   09/24/19 2200  cefTRIAXone (ROCEPHIN) 2 g in sodium chloride 0.9 % 100 mL IVPB        2 g 200 mL/hr over 30 Minutes Intravenous Daily at bedtime 09/24/19 1201 09/28/19 2338   09/24/19 0330  cefTRIAXone (ROCEPHIN) 1 g in sodium chloride 0.9 % 100 mL IVPB  Status:  Discontinued        1 g 200 mL/hr over 30 Minutes Intravenous Daily at bedtime 09/24/19 0245 09/24/19 1201   09/24/19 0300  azithromycin (ZITHROMAX) 500 mg in sodium chloride 0.9 % 250 mL IVPB  Status:  Discontinued        500 mg 250 mL/hr over 60 Minutes Intravenous Daily at bedtime 09/24/19 0245 09/24/19 1103     Objective: Vitals: Today's Vitals   10/17/19 0340 10/17/19 0404 10/17/19 0741 10/17/19 1115  BP: 109/62 109/62 (!) 129/101 116/82  Pulse: 87 100 95 90  Resp: 20 15 18  (!) 22  Temp: 97.9 F (36.6 C)  98.8 F (37.1 C) 99.5 F (37.5 C)  TempSrc: Axillary  Oral Axillary  SpO2: 94% 95% 96% 94%  Weight:      Height:      PainSc:         Intake/Output Summary (Last 24 hours) at 10/17/2019 1449 Last data filed at 10/16/2019 1600 Gross per 24 hour  Intake 2393.91 ml  Output --  Net 2393.91 ml   Filed Weights   10/09/19 0342 10/10/19 0339 10/11/19 0443  Weight: 96 kg 96.6 kg 96.3 kg   Weight change:   Intake/Output from previous day: 09/09 0701 - 09/10 0700 In: 2393.9 [I.V.:393.9; NG/GT:2000] Out: -  Intake/Output this shift: No intake/output data recorded.  Examination:  General exam: AA, tracks with eyes does not follow any commands, not communicating,  HEENT:Oral mucosa moist, Ear/Nose WNL grossly, dentition normal. Respiratory system: bilaterally clear, trach collar in place,no use of accessory muscle Cardiovascular system: S1 & S2 +, No JVD,. Gastrointestinal system: Abdomen soft, NT,ND, BS+. PEG+, Rectal tube in place. Nervous System:Alert, awake, moving extremities and grossly nonfocal Extremities: No edema, distal peripheral pulses palpable.  Skin: No rashes,no icterus. MSK: Normal muscle bulk,tone, power  Data Reviewed: I have personally reviewed following labs and imaging studies CBC: Recent Labs  Lab 10/11/19 0440 10/12/19 0656 10/15/19 0552  WBC 10.3 8.9 7.4  HGB 9.1* 9.0* 8.3*  HCT 31.1* 30.4* 27.6*  MCV 85.9 84.9 83.6  PLT 643* 722* 412*   Basic Metabolic Panel: Recent Labs  Lab 10/11/19 0440 10/11/19 0440 10/12/19 0656 10/12/19 0656 10/13/19 0440 10/14/19 0500 10/15/19 0552 10/16/19 0408 10/17/19 0330  NA 137   < > 139   < > 136 140 138 137 138  K 3.9   < > 4.3   < > 4.1 3.9 3.3* 4.5 4.9  CL 105   < > 105   < > 102 104 117* 102 101  CO2 23   < > 26   < > 24 25 21* 24 25  GLUCOSE 129*   < > 116*   < > 104* 125* 119* 142* 129*  BUN 19   < > 19   < > 17 15 12 15 18   CREATININE 0.47   < > 0.43*   < >  0.41* 0.41* 0.37* 0.44 0.42*  CALCIUM 9.1   < > 9.4   < > 9.5 9.8 8.1* 9.8 10.2  MG 2.1  --  1.8  --  1.9 1.8 1.6*  --   --   PHOS 4.3  --   --   --   --   --   --   --   --     < > = values in this interval not displayed.   GFR: Estimated Creatinine Clearance: 126.9 mL/min (A) (by C-G formula based on SCr of 0.42 mg/dL (L)). Liver Function Tests: No results for input(s): AST, ALT, ALKPHOS, BILITOT, PROT, ALBUMIN in the last 168 hours. No results for input(s): LIPASE, AMYLASE in the last 168 hours. No results for input(s): AMMONIA in the last 168 hours. Coagulation Profile: No results for input(s): INR, PROTIME in the last 168 hours. Cardiac Enzymes: No results for input(s): CKTOTAL, CKMB, CKMBINDEX, TROPONINI in the last 168 hours. BNP (last 3 results) No results for input(s): PROBNP in the last 8760 hours. HbA1C: No results for input(s): HGBA1C in the last 72 hours. CBG: Recent Labs  Lab 10/16/19 2328 10/17/19 0002 10/17/19 0327 10/17/19 0743 10/17/19 1121  GLUCAP 107* 135* 138* 124* 94   Lipid Profile: No results for input(s): CHOL, HDL, LDLCALC, TRIG, CHOLHDL, LDLDIRECT in the last 72 hours. Thyroid Function Tests: No results for input(s): TSH, T4TOTAL, FREET4, T3FREE, THYROIDAB in the last 72 hours. Anemia Panel: No results for input(s): VITAMINB12, FOLATE, FERRITIN, TIBC, IRON, RETICCTPCT in the last 72 hours. Sepsis Labs: No results for input(s): PROCALCITON, LATICACIDVEN in the last 168 hours.  Recent Results (from the past 240 hour(s))  Culture, blood (routine x 2)     Status: None   Collection Time: 10/08/19  9:01 AM   Specimen: BLOOD RIGHT HAND  Result Value Ref Range Status   Specimen Description BLOOD RIGHT HAND  Final   Special Requests   Final    BOTTLES DRAWN AEROBIC ONLY Blood Culture adequate volume   Culture   Final    NO GROWTH 5 DAYS Performed at [facility], [address], [place], [place] [address]    Report Status 10/13/2019 FINAL  Final  Culture, blood (routine x 2)     Status: None   Collection Time: 10/08/19  9:03 AM   Specimen: BLOOD RIGHT HAND  Result Value Ref Range Status   Specimen Description BLOOD  RIGHT HAND  Final   Special Requests   Final    BOTTLES DRAWN AEROBIC ONLY Blood Culture adequate volume   Culture   Final    NO GROWTH 5 DAYS Performed at [facility], [place] [address], [place], [place] [address]    Report Status 10/13/2019 FINAL  Final  Culture, respiratory (non-expectorated)     Status: None   Collection Time: 10/08/19 10:55 AM   Specimen: Tracheal Aspirate; Respiratory  Result Value Ref Range Status   Specimen Description TRACHEAL ASPIRATE  Final   Special Requests NONE  Final   Gram Stain   Final    FEW WBC PRESENT, PREDOMINANTLY PMN RARE GRAM NEGATIVE RODS RARE GRAM POSITIVE COCCI IN CHAINS Performed at [facility], [place] [address], [place], [place] [address]    Culture   Final    FEW STAPHYLOCOCCUS AUREUS RARE ENTEROBACTER AEROGENES    Report Status 10/10/2019 FINAL  Final   Organism ID, Bacteria STAPHYLOCOCCUS AUREUS  Final   Organism ID, Bacteria ENTEROBACTER AEROGENES  Final      Susceptibility   Enterobacter aerogenes - MIC*    CEFAZOLIN >=64 RESISTANT Resistant     CEFEPIME <=0.12 SENSITIVE Sensitive     CEFTAZIDIME 4 SENSITIVE Sensitive     CEFTRIAXONE 2 INTERMEDIATE Intermediate     CIPROFLOXACIN <=0.25 SENSITIVE Sensitive     GENTAMICIN <=1 SENSITIVE Sensitive     IMIPENEM 1 SENSITIVE Sensitive     TRIMETH/SULFA <=20 SENSITIVE Sensitive     PIP/TAZO <=4 SENSITIVE Sensitive     * RARE ENTEROBACTER AEROGENES   Staphylococcus aureus - MIC*    CIPROFLOXACIN <=0.5 SENSITIVE Sensitive     ERYTHROMYCIN RESISTANT Resistant     GENTAMICIN <=0.5 SENSITIVE Sensitive     OXACILLIN <=0.25 SENSITIVE Sensitive     TETRACYCLINE <=1 SENSITIVE Sensitive     VANCOMYCIN 1 SENSITIVE Sensitive     TRIMETH/SULFA <=10 SENSITIVE Sensitive     CLINDAMYCIN RESISTANT Resistant     RIFAMPIN <=0.5 SENSITIVE Sensitive     Inducible Clindamycin POSITIVE Resistant     * FEW STAPHYLOCOCCUS AUREUS  Culture, Urine     Status: None   Collection Time: 10/08/19   1:16 PM   Specimen: Urine, Random  Result Value Ref Range Status   Specimen Description URINE, RANDOM  Final   Special Requests NONE  Final   Culture   Final    NO GROWTH Performed at [facility], [address], [place], [place] [address]    Report Status 10/09/2019 FINAL  Final     Radiology Studies: No results found.   LOS: 23 days   Antonieta Pert, MD [facility]  10/17/2019, 2:49 PM

## 2019-10-18 LAB — GLUCOSE, CAPILLARY
Glucose-Capillary: 103 mg/dL — ABNORMAL HIGH (ref 70–99)
Glucose-Capillary: 108 mg/dL — ABNORMAL HIGH (ref 70–99)
Glucose-Capillary: 109 mg/dL — ABNORMAL HIGH (ref 70–99)
Glucose-Capillary: 112 mg/dL — ABNORMAL HIGH (ref 70–99)
Glucose-Capillary: 128 mg/dL — ABNORMAL HIGH (ref 70–99)
Glucose-Capillary: 130 mg/dL — ABNORMAL HIGH (ref 70–99)
Glucose-Capillary: 141 mg/dL — ABNORMAL HIGH (ref 70–99)

## 2019-10-18 LAB — BASIC METABOLIC PANEL
Anion gap: 9 (ref 5–15)
BUN: 24 mg/dL — ABNORMAL HIGH (ref 6–20)
CO2: 25 mmol/L (ref 22–32)
Calcium: 10 mg/dL (ref 8.9–10.3)
Chloride: 102 mmol/L (ref 98–111)
Creatinine, Ser: 0.48 mg/dL (ref 0.44–1.00)
GFR calc Af Amer: >60 mL/min (ref >60)
GFR calc non Af Amer: >60 mL/min (ref >60)
Glucose, Bld: 147 mg/dL — ABNORMAL HIGH (ref 70–99)
Potassium: 4.4 mmol/L (ref 3.5–5.1)
Sodium: 136 mmol/L (ref 135–145)

## 2019-10-18 LAB — CBC
HCT: 36.3 % (ref 36.0–46.0)
Hemoglobin: 10.9 g/dL — ABNORMAL LOW (ref 12.0–15.0)
MCH: 25.1 pg — ABNORMAL LOW (ref 26.0–34.0)
MCHC: 30 g/dL (ref 30.0–36.0)
MCV: 83.4 fL (ref 80.0–100.0)
Platelets: 713 10*3/uL — ABNORMAL HIGH (ref 150–400)
RBC: 4.35 MIL/uL (ref 3.87–5.11)
RDW: 17.1 % — ABNORMAL HIGH (ref 11.5–15.5)
WBC: 9.6 10*3/uL (ref 4.0–10.5)
nRBC: 0 % (ref 0.0–0.2)

## 2019-10-18 NOTE — Progress Notes (Signed)
PROGRESS NOTE    Beverly [name]  WVP:[id number] DOB: 20-Dec-1985 DOA: 09/24/2019 PCP: Patient, No Pcp Per     Brief Narrative: Per chart:" 34 y/o female with presented to the hospital for chest pain. She was 2 wks post emergency C section due to PROM. In the ED , her chest pain resolved but she subsequently had a VF arrest (polymorphin V tach and then V fib) with ROSC after defib x 3 and epi x 1. Immediately after arrest ST elevation was noted on EKG.  She was taken to the cath lab at [facility] where an aneurysmal dilatation (dissection?) of the LAD was identified. She was transferred to [facility] for ICU admission. LHC 8/17 >No coronary artery occlusion or critical stenosis. There is aneurysmal dilation of the ostial and proximal LAD with possible ulceration or dissection of uncertain chronicity. TIMI-3 flow is noted throughout the LAD and its branches. Severely reduced left ventricular systolic function with mid/apical anterior, apical, and apical inferior hypokinesis/akinesis. LV gram 30% EF.   Echo 8/18>LVEF 30-35%, septal and apical akinesis distal anterior wall an d mid/distal inferior wall hypokinesis hyperdynamic basal function. LV size mildly dilated.  MRI brain 8/23: Normal  8/30 underwent tracheostomy 8/30 PEG tube placement 9/10: Continues to be encephalopathic, able to open eyes and track but not following any commands and not talking,, is moving extremities independently  Subjective: Mother at the bedside. No acute events overnight.  Afebrile.  Vitals stable. Labs CBC, BMP relatively stable K4.4 and renal function is stable On trach collar at 5 L Is spontaneously moving extremities to some extent  Assessment & Plan:  V FIb arrest (in hospital) in the setting of NSTEMI/anterior MI: Seen by cardiology no new events.  Monitor electrolytes intermittently.  Continue on beta-blocker and other medical therapy as below   CAD/anterior MI status post cardiac cath appears  to have aneurysmal proximal LAD with dissection likely culprit.  Appreciate cardiology input.  Continue on current medical therapy with aspirin 81 mg, Lipitor 80 mg, Coreg.  Acute systolic congestive heart failure with EF 35%: 2/2 MI.  Compensated.  Continue current Coreg,Entresto digoxin spironolactone. Cardio has cut down Entresto due to soft BP. Last wt at 212 lb, stable. On admit 227-230 lb.  Acute anoxic encephalopathy/acute metabolic encephalopathy 2/2 V fib cardiac arrest: EEG 8/20 diffuse slowing but improved,  brain MRI normal x2.  Nursing reports patient sometimes tearful looking at her family pictures.seems to be tracking with eyes but not following any commands, is spontaneously  moving all her extremities.  Remains trach and PEG dependent   Hypokalemia/hypomagnesemia: Resolved Hypertension: BP 90-100s.Cont coreg Aldactone entresto. SVT in 8/27 - NSR. continue Coreg  Low-grade fever: Imaging question aspiration pneumonia, on cefepime started 9/2 -stopped  9/9.  Has been afebrile.  Respiratory failure with trach collar in place.  Continue pulmonary hygiene, continue respiratory support.  Thrombocytosis: Monitor, plt peaked 722k> 572k monitor intermittently..  Diarrhea: Rectal tube with loose stool, on tube feed. Hold laxatives.  Continue as needed Imodium.   DVT prophylaxis: enoxaparin (LOVENOX) injection 40 mg Start: 10/07/19 1000 Place and maintain sequential compression device Start: 09/24/19 0136 Code Status:   Code Status: Full Code  Family Communication: plan of care discussed with patient's mother at bedside previously and now no family at bedside. Status is: Inpatient  Remains inpatient appropriate because:IV treatments appropriate due to intensity of illness or inability to take PO and Inpatient level of care appropriate due to severity of illness  Dispo: The  patient is from: Home              Anticipated d/c is to: LTAC vs SNF- wiil need trach in place for at least 30  days prior to going to SNF.              Anticipated d/c date is: Once bed available              Patient currently medically stable for LTAC placement  Nutrition: Diet Order    None      Nutrition Problem: Increased nutrient needs Etiology: post-op healing Signs/Symptoms: estimated needs Interventions: Tube feeding Body mass index is 30.46 kg/m.  Consultants:see note  Procedures:see note Microbiology:see note Blood Culture    Component Value Date/Time   SDES URINE, RANDOM 10/08/2019 1316   SPECREQUEST NONE 10/08/2019 1316   CULT  10/08/2019 1316    NO GROWTH Performed at [facility] [address] [place], [place] [address]    REPTSTATUS 10/09/2019 FINAL 10/08/2019 1316    Other culture-see note  Medications: Scheduled Meds: . aspirin  81 mg Per Tube Daily  . atorvastatin  80 mg Per Tube Daily  . carvedilol  6.25 mg Per Tube BID WC  . chlorhexidine gluconate (MEDLINE KIT)  15 mL Mouth Rinse BID  . Chlorhexidine Gluconate Cloth  6 each Topical Daily  . digoxin  0.125 mg Per Tube Daily  . enoxaparin (LOVENOX) injection  40 mg Subcutaneous Q24H  . feeding supplement (PROSource TF)  45 mL Per Tube QID  . insulin aspart  0-15 Units Subcutaneous Q4H  . mouth rinse  15 mL Mouth Rinse 10 times per day  . prenatal multivitamin  1 tablet Per Tube Q1200  . sacubitril-valsartan  1 tablet Oral BID  . scopolamine  1 patch Transdermal Q72H  . sodium chloride flush  10-40 mL Intracatheter Q12H  . spironolactone  25 mg Per Tube Daily   Continuous Infusions: . sodium chloride 10 mL/hr at 10/16/19 1600  . feeding supplement (OSMOLITE 1.5 CAL) 1,000 mL (10/15/19 1109)    Antimicrobials: Anti-infectives (From admission, onward)   Start     Dose/Rate Route Frequency Ordered Stop   10/09/19 2100  vancomycin (VANCOREADY) IVPB 1500 mg/300 mL  Status:  Discontinued        1,500 mg 150 mL/hr over 120 Minutes Intravenous Every 12 hours 10/09/19 1237 10/10/19 1121   10/09/19  0800  ceFEPIme (MAXIPIME) 2 g in sodium chloride 0.9 % 100 mL IVPB        2 g 200 mL/hr over 30 Minutes Intravenous Every 8 hours 10/09/19 0752 10/15/19 2147   10/09/19 0800  vancomycin (VANCOREADY) IVPB 2000 mg/400 mL        2,000 mg 200 mL/hr over 120 Minutes Intravenous  Once 10/09/19 0752 10/09/19 1147   10/07/19 1540  ceFAZolin (ANCEF) 2-4 GM/100ML-% IVPB       Note to Pharmacy: [name] Manges   : cabinet override      10/07/19 1540 10/08/19 0344   10/07/19 1400  ceFAZolin (ANCEF) IVPB 2g/100 mL premix        2 g 200 mL/hr over 30 Minutes Intravenous To Radiology 10/06/19 1339 10/07/19 1627   09/30/19 0300  vancomycin (VANCOCIN) IVPB 1000 mg/200 mL premix  Status:  Discontinued        1,000 mg 200 mL/hr over 60 Minutes Intravenous Every 8 hours 09/29/19 1809 10/01/19 0853   09/29/19 1815  ceFEPIme (MAXIPIME) 2 g in sodium chloride  0.9 % 100 mL IVPB        2 g 200 mL/hr over 30 Minutes Intravenous Every 8 hours 09/29/19 1803 10/05/19 1801   09/29/19 1815  vancomycin (VANCOREADY) IVPB 2000 mg/400 mL        2,000 mg 200 mL/hr over 120 Minutes Intravenous  Once 09/29/19 1803 09/29/19 2137   09/24/19 2200  cefTRIAXone (ROCEPHIN) 2 g in sodium chloride 0.9 % 100 mL IVPB        2 g 200 mL/hr over 30 Minutes Intravenous Daily at bedtime 09/24/19 1201 09/28/19 2338   09/24/19 0330  cefTRIAXone (ROCEPHIN) 1 g in sodium chloride 0.9 % 100 mL IVPB  Status:  Discontinued        1 g 200 mL/hr over 30 Minutes Intravenous Daily at bedtime 09/24/19 0245 09/24/19 1201   09/24/19 0300  azithromycin (ZITHROMAX) 500 mg in sodium chloride 0.9 % 250 mL IVPB  Status:  Discontinued        500 mg 250 mL/hr over 60 Minutes Intravenous Daily at bedtime 09/24/19 0245 09/24/19 1103     Objective: Vitals: Today's Vitals   10/18/19 0500 10/18/19 0747 10/18/19 0824 10/18/19 1203  BP: 102/89 100/70  98/65  Pulse: 81 96 (!) 102 100  Resp: 18 17 17 20   Temp: 97.7 F (36.5 C) 99 F (37.2 C)  98.9 F (37.2  C)  TempSrc:  Axillary  Oral  SpO2:  97% 96% 98%  Weight:      Height:      PainSc:       No intake or output data in the 24 hours ending 10/18/19 1352 Filed Weights   10/09/19 0342 10/10/19 0339 10/11/19 0443  Weight: 96 kg 96.6 kg 96.3 kg   Weight change:   Intake/Output from previous day: No intake/output data recorded. Intake/Output this shift: No intake/output data recorded.  Examination:  General exam: Alert awake not following commands, tracks with eyes, on trach collar.   HEENT:Oral mucosa moist, Ear/Nose WNL grossly, dentition normal. Respiratory system: bilaterally clear, trach collar present no wheezing or crackles,no use of accessory muscle Cardiovascular system: S1 & S2 +, No JVD,. Gastrointestinal system: Abdomen soft, NT,ND, BS+. PRG+, flexi seal intact. Nervous System:Alert, awake, moving extremities spontaneously but not following commands, nonverbal Extremities: No edema, distal peripheral pulses palpable.  Skin: No rashes,no icterus. MSK: Normal muscle bulk,tone, power  Data Reviewed: I have personally reviewed following labs and imaging studies CBC: Recent Labs  Lab 10/12/19 0656 10/15/19 0552 10/18/19 0513  WBC 8.9 7.4 9.6  HGB 9.0* 8.3* 10.9*  HCT 30.4* 27.6* 36.3  MCV 84.9 83.6 83.4  PLT 722* 572* 163*   Basic Metabolic Panel: Recent Labs  Lab 10/12/19 0656 10/12/19 0656 10/13/19 0440 10/13/19 0440 10/14/19 0500 10/15/19 0552 10/16/19 0408 10/17/19 0330 10/18/19 0513  NA 139   < > 136   < > 140 138 137 138 136  K 4.3   < > 4.1   < > 3.9 3.3* 4.5 4.9 4.4  CL 105   < > 102   < > 104 117* 102 101 102  CO2 26   < > 24   < > 25 21* 24 25 25   GLUCOSE 116*   < > 104*   < > 125* 119* 142* 129* 147*  BUN 19   < > 17   < > 15 12 15 18  24*  CREATININE 0.43*   < > 0.41*   < > 0.41* 0.37* 0.44 0.42*  0.48  CALCIUM 9.4   < > 9.5   < > 9.8 8.1* 9.8 10.2 10.0  MG 1.8  --  1.9  --  1.8 1.6*  --   --   --    < > = values in this interval not  displayed.   GFR: Estimated Creatinine Clearance: 126.9 mL/min (by C-G formula based on SCr of 0.48 mg/dL). Liver Function Tests: No results for input(s): AST, ALT, ALKPHOS, BILITOT, PROT, ALBUMIN in the last 168 hours. No results for input(s): LIPASE, AMYLASE in the last 168 hours. No results for input(s): AMMONIA in the last 168 hours. Coagulation Profile: No results for input(s): INR, PROTIME in the last 168 hours. Cardiac Enzymes: No results for input(s): CKTOTAL, CKMB, CKMBINDEX, TROPONINI in the last 168 hours. BNP (last 3 results) No results for input(s): PROBNP in the last 8760 hours. HbA1C: No results for input(s): HGBA1C in the last 72 hours. CBG: Recent Labs  Lab 10/17/19 2009 10/18/19 0009 10/18/19 0439 10/18/19 0837 10/18/19 1206  GLUCAP 107* 130* 141* 128* 108*   Lipid Profile: No results for input(s): CHOL, HDL, LDLCALC, TRIG, CHOLHDL, LDLDIRECT in the last 72 hours. Thyroid Function Tests: No results for input(s): TSH, T4TOTAL, FREET4, T3FREE, THYROIDAB in the last 72 hours. Anemia Panel: No results for input(s): VITAMINB12, FOLATE, FERRITIN, TIBC, IRON, RETICCTPCT in the last 72 hours. Sepsis Labs: No results for input(s): PROCALCITON, LATICACIDVEN in the last 168 hours.  No results found for this or any previous visit (from the past 240 hour(s)).   Radiology Studies: No results found.   LOS: 24 days   Antonieta Pert, MD [facility]  10/18/2019, 1:52 PM

## 2019-10-19 LAB — BASIC METABOLIC PANEL
Anion gap: 10 (ref 5–15)
BUN: 25 mg/dL — ABNORMAL HIGH (ref 6–20)
CO2: 25 mmol/L (ref 22–32)
Calcium: 9.8 mg/dL (ref 8.9–10.3)
Chloride: 102 mmol/L (ref 98–111)
Creatinine, Ser: 0.48 mg/dL (ref 0.44–1.00)
GFR calc Af Amer: >60 mL/min (ref >60)
GFR calc non Af Amer: >60 mL/min (ref >60)
Glucose, Bld: 116 mg/dL — ABNORMAL HIGH (ref 70–99)
Potassium: 4.3 mmol/L (ref 3.5–5.1)
Sodium: 137 mmol/L (ref 135–145)

## 2019-10-19 LAB — CBC
HCT: 34.6 % — ABNORMAL LOW (ref 36.0–46.0)
Hemoglobin: 10.4 g/dL — ABNORMAL LOW (ref 12.0–15.0)
MCH: 25 pg — ABNORMAL LOW (ref 26.0–34.0)
MCHC: 30.1 g/dL (ref 30.0–36.0)
MCV: 83.2 fL (ref 80.0–100.0)
Platelets: 694 10*3/uL — ABNORMAL HIGH (ref 150–400)
RBC: 4.16 MIL/uL (ref 3.87–5.11)
RDW: 17.2 % — ABNORMAL HIGH (ref 11.5–15.5)
WBC: 8.4 10*3/uL (ref 4.0–10.5)
nRBC: 0 % (ref 0.0–0.2)

## 2019-10-19 LAB — GLUCOSE, CAPILLARY
Glucose-Capillary: 139 mg/dL — ABNORMAL HIGH (ref 70–99)
Glucose-Capillary: 119 mg/dL — ABNORMAL HIGH (ref 70–99)
Glucose-Capillary: 122 mg/dL — ABNORMAL HIGH (ref 70–99)
Glucose-Capillary: 127 mg/dL — ABNORMAL HIGH (ref 70–99)
Glucose-Capillary: 113 mg/dL — ABNORMAL HIGH (ref 70–99)

## 2019-10-19 MED ORDER — SACUBITRIL-VALSARTAN 24-26 MG PO TABS
1.0000 | ORAL_TABLET | Freq: Two times a day (BID) | ORAL | Status: DC
  Administered 2019-10-19 – 2019-10-20 (×2): 1 via ORAL
  Filled 2019-10-19 (×2): qty 1

## 2019-10-19 NOTE — Plan of Care (Signed)
Poc progressing.  

## 2019-10-19 NOTE — Progress Notes (Signed)
[name] mom called, updated on patient. Answered all questions and concerns. Pt is smiling every now and than, moving arms and legs independently. Does not follow command. Got little restless after she herd her kids on phone. Will continue to monitor.

## 2019-10-19 NOTE — Progress Notes (Addendum)
Patient scoring Yellow MEWS. Patient vitals checked hourly and closely monitored for assessment changes. Notified [name] Rapid Response RN [name] at 1430 by accident. Redirected call to correct [name] Rapid response RN to seek guidance. RN states they will look at the chart. (Rapid Response RN's name not given; RN states they do not want to give their name if they haven't seen the patient)  1632: Patient MEWS score green. Continue to monitor. Beverly [name], RRT RN came to floor. No new orders or interventions at this time.

## 2019-10-19 NOTE — Progress Notes (Signed)
PROGRESS NOTE    Beverly [name]  KVQ:[id number] DOB: 1985/03/02 DOA: 09/24/2019 PCP: Patient, No Pcp Per     Brief Narrative: Per chart:" 34 y/o female with presented to the hospital for chest pain. She was 2 wks post emergency C section due to PROM. In the ED , her chest pain resolved but she subsequently had a VF arrest (polymorphin V tach and then V fib) with ROSC after defib x 3 and epi x 1. Immediately after arrest ST elevation was noted on EKG.  She was taken to the cath lab at [facility] where an aneurysmal dilatation (dissection?) of the LAD was identified. She was transferred to [facility] for ICU admission. LHC 8/17 >No coronary artery occlusion or critical stenosis. There is aneurysmal dilation of the ostial and proximal LAD with possible ulceration or dissection of uncertain chronicity. TIMI-3 flow is noted throughout the LAD and its branches. Severely reduced left ventricular systolic function with mid/apical anterior, apical, and apical inferior hypokinesis/akinesis. LV gram 30% EF.   Echo 8/18>LVEF 30-35%, septal and apical akinesis distal anterior wall an d mid/distal inferior wall hypokinesis hyperdynamic basal function. LV size mildly dilated.  MRI brain 8/23: Normal  8/30 underwent tracheostomy 8/30 PEG tube placement 9/10: Continues to be encephalopathic, able to open eyes and track but not following any commands and not talking,is moving extremities independently  Subjective:  No family at bedside.  Patient is alert awake appears to be smiling today but does not follow any commands.  CBC BMP stable. Bp soft On trach collar at 5 L Is spontaneously moving extremities to some extent  Assessment & Plan:  V FIb arrest (in hospital) in the setting of NSTEMI/anterior MI: Seen by cardiology no new events.  Monitor electrolytes intermittently.  Continue on beta-blocker and other medical therapy as below   CAD/anterior MI status post cardiac cath appears to have  aneurysmal proximal LAD with dissection likely culprit.  Appreciate cardiology input.  Continue on current medical therapy with aspirin 81 mg, Lipitor 80 mg, Coreg.  Acute systolic congestive heart failure with EF 35%: 2/2 MI.  Compensated.  Continue current Coreg,Entresto digoxin spironolactone. Cardio has cut down Entresto due to soft BP- held today. Last wt at 212 lb, stable. On admit 227-230 lb.  Acute anoxic encephalopathy/acute metabolic encephalopathy 2/2 V fib cardiac arrest: EEG 8/20 diffuse slowing but improved,  brain MRI normal x2.  Nursing reports patient sometimes tearful looking at her family pictures.seems to be tracking with eyes but not following any commands, is spontaneously  moving all her extremities.  Remains trach and PEG dependent  Hypokalemia/hypomagnesemia: Resolved  Hypertension: Blood pressure has been soft 90s to low 100.  On coreg Aldactone entresto.  Entresto dose was cut down to 59/51 on 9/8.  Monitor. cardiology to see if we need adjust meds  SVT in 8/27 - NSR. continue Coreg  Low-grade fever: Imaging question aspiration pneumonia, was on cefepime completed on 9/9.   Respiratory failure with trach collar in place.  Continue pulmonary hygiene, continue respiratory support.  Thrombocytosis: Monitor, plt peaked 722k> 572k monitor intermittently..  Diarrhea: Rectal tube with loose stool, on tube feed. Hold laxatives.  Continue as needed Imodium.   DVT prophylaxis: enoxaparin (LOVENOX) injection 40 mg Start: 10/07/19 1000 Place and maintain sequential compression device Start: 09/24/19 0136 Code Status:   Code Status: Full Code  Family Communication: plan of care discussed with patient's mother at bedside multiple occasions  Status is: Inpatient Remains inpatient appropriate because:IV treatments  appropriate due to intensity of illness or inability to take PO and Inpatient level of care appropriate due to severity of illness  Dispo: The patient is from: Home               Anticipated d/c is to: LTAC vs SNF- wiil need trach in place for at least 30 days prior to going to SNF.              Anticipated d/c date is: Once bed available              Patient currently medically stable for LTAC placement  Nutrition: Diet Order    None      Nutrition Problem: Increased nutrient needs Etiology: post-op healing Signs/Symptoms: estimated needs Interventions: Tube feeding Body mass index is 30.46 kg/m.  Consultants:see note  Procedures:see note Microbiology:see note Blood Culture    Component Value Date/Time   SDES URINE, RANDOM 10/08/2019 1316   SPECREQUEST NONE 10/08/2019 1316   CULT  10/08/2019 1316    NO GROWTH Performed at [facility] [address] [place], [place] [address]    REPTSTATUS 10/09/2019 FINAL 10/08/2019 1316    Other culture-see note  Medications: Scheduled Meds: . aspirin  81 mg Per Tube Daily  . atorvastatin  80 mg Per Tube Daily  . carvedilol  6.25 mg Per Tube BID WC  . chlorhexidine gluconate (MEDLINE KIT)  15 mL Mouth Rinse BID  . Chlorhexidine Gluconate Cloth  6 each Topical Daily  . digoxin  0.125 mg Per Tube Daily  . enoxaparin (LOVENOX) injection  40 mg Subcutaneous Q24H  . feeding supplement (PROSource TF)  45 mL Per Tube QID  . insulin aspart  0-15 Units Subcutaneous Q4H  . mouth rinse  15 mL Mouth Rinse 10 times per day  . prenatal multivitamin  1 tablet Per Tube Q1200  . sacubitril-valsartan  1 tablet Oral BID  . scopolamine  1 patch Transdermal Q72H  . sodium chloride flush  10-40 mL Intracatheter Q12H  . spironolactone  25 mg Per Tube Daily   Continuous Infusions: . sodium chloride 10 mL/hr at 10/16/19 1600  . feeding supplement (OSMOLITE 1.5 CAL) 1,000 mL (10/19/19 0859)    Antimicrobials: Anti-infectives (From admission, onward)   Start     Dose/Rate Route Frequency Ordered Stop   10/09/19 2100  vancomycin (VANCOREADY) IVPB 1500 mg/300 mL  Status:  Discontinued        1,500 mg 150  mL/hr over 120 Minutes Intravenous Every 12 hours 10/09/19 1237 10/10/19 1121   10/09/19 0800  ceFEPIme (MAXIPIME) 2 g in sodium chloride 0.9 % 100 mL IVPB        2 g 200 mL/hr over 30 Minutes Intravenous Every 8 hours 10/09/19 0752 10/15/19 2147   10/09/19 0800  vancomycin (VANCOREADY) IVPB 2000 mg/400 mL        2,000 mg 200 mL/hr over 120 Minutes Intravenous  Once 10/09/19 0752 10/09/19 1147   10/07/19 1540  ceFAZolin (ANCEF) 2-4 GM/100ML-% IVPB       Note to Pharmacy: [name] Manges   : cabinet override      10/07/19 1540 10/08/19 0344   10/07/19 1400  ceFAZolin (ANCEF) IVPB 2g/100 mL premix        2 g 200 mL/hr over 30 Minutes Intravenous To Radiology 10/06/19 1339 10/07/19 1627   09/30/19 0300  vancomycin (VANCOCIN) IVPB 1000 mg/200 mL premix  Status:  Discontinued        1,000 mg  200 mL/hr over 60 Minutes Intravenous Every 8 hours 09/29/19 1809 10/01/19 0853   09/29/19 1815  ceFEPIme (MAXIPIME) 2 g in sodium chloride 0.9 % 100 mL IVPB        2 g 200 mL/hr over 30 Minutes Intravenous Every 8 hours 09/29/19 1803 10/05/19 1801   09/29/19 1815  vancomycin (VANCOREADY) IVPB 2000 mg/400 mL        2,000 mg 200 mL/hr over 120 Minutes Intravenous  Once 09/29/19 1803 09/29/19 2137   09/24/19 2200  cefTRIAXone (ROCEPHIN) 2 g in sodium chloride 0.9 % 100 mL IVPB        2 g 200 mL/hr over 30 Minutes Intravenous Daily at bedtime 09/24/19 1201 09/28/19 2338   09/24/19 0330  cefTRIAXone (ROCEPHIN) 1 g in sodium chloride 0.9 % 100 mL IVPB  Status:  Discontinued        1 g 200 mL/hr over 30 Minutes Intravenous Daily at bedtime 09/24/19 0245 09/24/19 1201   09/24/19 0300  azithromycin (ZITHROMAX) 500 mg in sodium chloride 0.9 % 250 mL IVPB  Status:  Discontinued        500 mg 250 mL/hr over 60 Minutes Intravenous Daily at bedtime 09/24/19 0245 09/24/19 1103     Objective: Vitals: Today's Vitals   10/19/19 0000 10/19/19 0409 10/19/19 0806 10/19/19 0815  BP:  105/71  96/80  Pulse: 82 95 92 100    Resp: _0 Temp:  98.3 F (36.8 C)  98.6 F (37 C)  TempSrc:  Oral  Oral  SpO2:  96% 96% 97%  Weight:      Height:      PainSc:        Intake/Output Summary (Last 24 hours) at 10/19/2019 1105 Last data filed at 10/19/2019 0500 Gross per 24 hour  Intake 920 ml  Output 750 ml  Net 170 ml   Filed Weights   10/09/19 0342 10/10/19 0339 10/11/19 0443  Weight: 96 kg 96.6 kg 96.3 kg   Weight change:   Intake/Output from previous day: 09/11 0701 - 09/12 0700 In: 920 [I.V.:60; NG/GT:800] Out: 750 [Urine:750] Intake/Output this shift: No intake/output data recorded.  Examination:  General exam: Alert awake appears to be smiling but does not follow any commands, nonverbal, spontaneously moving extremities.   HEENT:Oral mucosa moist, Ear/Nose WNL grossly, dentition normal. Respiratory system: bilaterally clear, trach collar present.  No wheezing or crackles,no use of accessory muscle Cardiovascular system: S1 & S2 +, No JVD,. Gastrointestinal system: Abdomen soft, NT,ND, BS+. PEG+ Nervous System:Alert, awake, moving extremities and grossly nonfocal Extremities: No edema, distal peripheral pulses palpable.  Skin: No rashes,no icterus. MSK: Normal muscle bulk,tone, power  Flexi-Seal present  Data Reviewed: I have personally reviewed following labs and imaging studies CBC: Recent Labs  Lab 10/15/19 0552 10/18/19 0513 10/19/19 0500  WBC 7.4 9.6 8.4  HGB 8.3* 10.9* 10.4*  HCT 27.6* 36.3 34.6*  MCV 83.6 83.4 83.2  PLT 572* 713* 580*   Basic Metabolic Panel: Recent Labs  Lab 10/13/19 0440 10/13/19 0440 10/14/19 0500 10/14/19 0500 10/15/19 0552 10/16/19 0408 10/17/19 0330 10/18/19 0513 10/19/19 0500  NA 136   < > 140   < > 138 137 138 136 137  K 4.1   < > 3.9   < > 3.3* 4.5 4.9 4.4 4.3  CL 102   < > 104   < > 117* 102 101 102 102  CO2 24   < > 25   < > 21*  _0 GLUCOSE 104*   < > 125*   < > 119* 142* 129* 147* 116*  BUN 17   < > 15   < > _1 24* 25*  CREATININE 0.41*   < > 0.41*   < > 0.37* 0.44 0.42* 0.48 0.48  CALCIUM 9.5   < > 9.8   < > 8.1* 9.8 10.2 10.0 9.8  MG 1.9  --  1.8  --  1.6*  --   --   --   --    < > = values in this interval not displayed.   GFR: Estimated Creatinine Clearance: 126.9 mL/min (by C-G formula based on SCr of 0.48 mg/dL). Liver Function Tests: No results for input(s): AST, ALT, ALKPHOS, BILITOT, PROT, ALBUMIN in the last 168 hours. No results for input(s): LIPASE, AMYLASE in the last 168 hours. No results for input(s): AMMONIA in the last 168 hours. Coagulation Profile: No results for input(s): INR, PROTIME in the last 168 hours. Cardiac Enzymes: No results for input(s): CKTOTAL, CKMB, CKMBINDEX, TROPONINI in the last 168 hours. BNP (last 3 results) No results for input(s): PROBNP in the last 8760 hours. HbA1C: No results for input(s): HGBA1C in the last 72 hours. CBG: Recent Labs  Lab 10/18/19 1540 10/18/19 2001 10/18/19 2351 10/19/19 0408 10/19/19 0817  GLUCAP 103* 112* 109* 139* 119*   Lipid Profile: No results for input(s): CHOL, HDL, LDLCALC, TRIG, CHOLHDL, LDLDIRECT in the last 72 hours. Thyroid Function Tests: No results for input(s): TSH, T4TOTAL, FREET4, T3FREE, THYROIDAB in the last 72 hours. Anemia Panel: No results for input(s): VITAMINB12, FOLATE, FERRITIN, TIBC, IRON, RETICCTPCT in the last 72 hours. Sepsis Labs: No results for input(s): PROCALCITON, LATICACIDVEN in the last 168 hours.  No results found for this or any previous visit (from the past 240 hour(s)).   Radiology Studies: No results found.   LOS: 25 days   Antonieta Pert, MD [facility]  10/19/2019, 11:05 AM

## 2019-10-20 LAB — CBC
HCT: 33.9 % — ABNORMAL LOW (ref 36.0–46.0)
Hemoglobin: 10.2 g/dL — ABNORMAL LOW (ref 12.0–15.0)
MCH: 25.1 pg — ABNORMAL LOW (ref 26.0–34.0)
MCHC: 30.1 g/dL (ref 30.0–36.0)
MCV: 83.3 fL (ref 80.0–100.0)
Platelets: 641 10*3/uL — ABNORMAL HIGH (ref 150–400)
RBC: 4.07 MIL/uL (ref 3.87–5.11)
RDW: 17 % — ABNORMAL HIGH (ref 11.5–15.5)
WBC: 7.8 10*3/uL (ref 4.0–10.5)
nRBC: 0 % (ref 0.0–0.2)

## 2019-10-20 LAB — GLUCOSE, CAPILLARY
Glucose-Capillary: 102 mg/dL — ABNORMAL HIGH (ref 70–99)
Glucose-Capillary: 106 mg/dL — ABNORMAL HIGH (ref 70–99)
Glucose-Capillary: 112 mg/dL — ABNORMAL HIGH (ref 70–99)
Glucose-Capillary: 115 mg/dL — ABNORMAL HIGH (ref 70–99)
Glucose-Capillary: 129 mg/dL — ABNORMAL HIGH (ref 70–99)
Glucose-Capillary: 138 mg/dL — ABNORMAL HIGH (ref 70–99)
Glucose-Capillary: 138 mg/dL — ABNORMAL HIGH (ref 70–99)

## 2019-10-20 LAB — BASIC METABOLIC PANEL
Anion gap: 9 (ref 5–15)
BUN: 21 mg/dL — ABNORMAL HIGH (ref 6–20)
CO2: 26 mmol/L (ref 22–32)
Calcium: 9.8 mg/dL (ref 8.9–10.3)
Chloride: 104 mmol/L (ref 98–111)
Creatinine, Ser: 0.48 mg/dL (ref 0.44–1.00)
GFR calc Af Amer: >60 mL/min (ref >60)
GFR calc non Af Amer: >60 mL/min (ref >60)
Glucose, Bld: 140 mg/dL — ABNORMAL HIGH (ref 70–99)
Potassium: 4.1 mmol/L (ref 3.5–5.1)
Sodium: 139 mmol/L (ref 135–145)

## 2019-10-20 MED ORDER — SODIUM CHLORIDE 0.9 % IV BOLUS
500.0000 mL | Freq: Once | INTRAVENOUS | Status: AC
  Administered 2019-10-20: 500 mL via INTRAVENOUS

## 2019-10-20 MED ORDER — SACUBITRIL-VALSARTAN 24-26 MG PO TABS
1.0000 | ORAL_TABLET | Freq: Two times a day (BID) | ORAL | Status: DC
  Administered 2019-10-20 – 2019-11-28 (×78): 1
  Filled 2019-10-20 (×78): qty 1

## 2019-10-20 NOTE — Plan of Care (Signed)

## 2019-10-20 NOTE — Progress Notes (Addendum)
ADVANCED HF PROGRESS  Patient Name: Beverly [name] Date of Encounter: 10/20/2019  [facility] Cardiologist: No primary care provider on file.   Subjective   Yesterday entresto was cut back to 24-26 mg due to hypotension.   Seems to tracking more.   Inpatient Medications    Scheduled Meds: . aspirin  81 mg Per Tube Daily  . atorvastatin  80 mg Per Tube Daily  . carvedilol  6.25 mg Per Tube BID WC  . chlorhexidine gluconate (MEDLINE KIT)  15 mL Mouth Rinse BID  . Chlorhexidine Gluconate Cloth  6 each Topical Daily  . digoxin  0.125 mg Per Tube Daily  . enoxaparin (LOVENOX) injection  40 mg Subcutaneous Q24H  . feeding supplement (PROSource TF)  45 mL Per Tube QID  . insulin aspart  0-15 Units Subcutaneous Q4H  . mouth rinse  15 mL Mouth Rinse 10 times per day  . prenatal multivitamin  1 tablet Per Tube Q1200  . sacubitril-valsartan  1 tablet Oral BID  . scopolamine  1 patch Transdermal Q72H  . sodium chloride flush  10-40 mL Intracatheter Q12H  . spironolactone  25 mg Per Tube Daily   Continuous Infusions: . sodium chloride 10 mL/hr at 10/16/19 1600  . feeding supplement (OSMOLITE 1.5 CAL) 1,000 mL (10/20/19 0151)   PRN Meds: acetaminophen (TYLENOL) oral liquid 160 mg/5 mL, diphenoxylate-atropine, hydrALAZINE, sodium chloride flush   Vital Signs    Vitals:   10/20/19 0311 10/20/19 0358 10/20/19 0812 10/20/19 0820  BP:  109/72  (!) 111/98  Pulse: 98 96 (!) 110 (!) 101  Resp: 17 18 19 20   Temp:  98.4 F (36.9 C)  98 F (36.7 C)  TempSrc:  Oral  Axillary  SpO2: 100% 100% 100% 99%  Weight:      Height:        Intake/Output Summary (Last 24 hours) at 10/20/2019 0947 Last data filed at 10/20/2019 0100 Gross per 24 hour  Intake 1830 ml  Output --  Net 1830 ml   Last 3 Weights 10/11/2019 10/10/2019 10/09/2019  Weight (lbs) 212 lb 4.9 oz 212 lb 15.4 oz 211 lb 10.3 oz  Weight (kg) 96.3 kg 96.6 kg 96 kg  Some encounter information is confidential and restricted. Go  to Review Flowsheets activity to see all data.       Telemetry   SR-ST 90-100s    Physical Exam   General:  No resp difficulty HEENT: normal Neck: supple. no JVD. Carotids 2+ bilat; no bruits. No lymphadenopathy or thryomegaly appreciated. Trach  Cor: PMI nondisplaced. Regular rate & rhythm. No rubs, gallops or murmurs. Lungs: clear Abdomen: soft, nontender, nondistended. No hepatosplenomegaly. No bruits or masses. Good bowel sounds. Extremities: no cyanosis, clubbing, rash, edema Neuro: Awake. Moves all extremities x4. Does not follow commands.   Labs    High Sensitivity Troponin:   Recent Labs  Lab 09/23/19 1539 09/23/19 1816 09/24/19 0039 09/24/19 0826  TROPONINIHS 127* 2,078* 24,421* >27,000*      Chemistry Recent Labs  Lab 10/18/19 0513 10/19/19 0500 10/20/19 0500  NA 136 137 139  K 4.4 4.3 4.1  CL 102 102 104  CO2 25 25 26   GLUCOSE 147* 116* 140*  BUN 24* 25* 21*  CREATININE 0.48 0.48 0.48  CALCIUM 10.0 9.8 9.8  GFRNONAA >60 >60 >60  GFRAA >60 >60 >60  ANIONGAP 9 10 9      Hematology Recent Labs  Lab 10/18/19 0513 10/19/19 0500 10/20/19 0500  WBC 9.6  8.4 7.8  RBC 4.35 4.16 4.07  HGB 10.9* 10.4* 10.2*  HCT 36.3 34.6* 33.9*  MCV 83.4 83.2 83.3  MCH 25.1* 25.0* 25.1*  MCHC 30.0 30.1 30.1  RDW 17.1* 17.2* 17.0*  PLT 713* 694* 641*    BNP Recent Labs  Lab 10/14/19 0500 10/15/19 0552  BNP 200.4* 168.9*     DDimer No results for input(s): DDIMER in the last 168 hours.   Radiology    No results found.  Cardiac Studies   See section above (reviewed)  Patient Profile     34 y.o. female 2 weeks post-partum with VF/torsades arrest in the setting of anterior MI  Assessment & Plan    1. VF arrest (in-hospital): in setting of anterior infarct. - Rhythm stable unfortunately with severe anoxic brain injury. (see below) - EEG 8/20 diffuse slowing but improved - Brain MRI normal x 2. EEG still with diffuse slowing - Remains on vent  through trach.  - VT quiescent. Keep K> 4.0 Mg > 2.0  2. Acute systolic heart failure: secondary to anterior MI, 09/24/19 LVEF 35%.  -Volume status stable.  - On carvedilol 6.25 bid - Continue Entresto 24-26 mg bid dose cut back due to soft BP.  - Continue dig 0.125  - Continue spiro 25 mg daily.  3. CAD/Anterior MI: angio reviewed again. Appears to have aneurysmal proximal LAD with dissection, likely culprit. Had TIMI-3 flow at cath but large infarct.  - Continue ASA,  beta-blocker and statin.  - Continue medical therapy for now 4. Encephalopathy, post-arrest.Likely anoxic brain injury  - MRI 09/29/19 & 10/07/19 negative.  - EEG 8/31 EEGwith diffuse slowing - Seems to be tracking more.  5. Hypokalemia/hypomag -Stable.  6. Hypertension -Stable. 7. Fevers -Remains afebrile.  - Abx per TRH 8. SVT on 10/03/19 - Resolved. Continue current dose of carvedilol.      Jeanmarie Hubert, NP  10/20/2019, 9:47 AM    Patient seen and examined with the above-signed Advanced Practice Provider and/or Housestaff. I personally reviewed laboratory data, imaging studies and relevant notes. I independently examined the patient and formulated the important aspects of the plan. I have edited the note to reflect any of my changes or salient points. I have personally discussed the plan with the patient and/or family.  More awake and seems to interact at times with smiling however does not do so reliably. Will not follow commands. Remains tachycardic. Entresto dose lowered due to low BP. Weight is down 18 pounds from admit.   General:  Awake on trach collar not following commands HEENT: normal Neck: supple. no JVD. + trach Carotids 2+ bilat; no bruits. No lymphadenopathy or thryomegaly appreciated. Cor: PMI nondisplaced. Regular tachy  No rubs, gallops or murmurs. Lungs: clear Abdomen: obese soft, nontender, nondistended. No hepatosplenomegaly. No bruits or masses. Good bowel sounds. Extremities: no  cyanosis, clubbing, rash, edema Neuro: awake alert but will not follow commands. Smiles at times.   Seems to be make slow neurological progress. HR remains high and BP low. Tm 99.1. WBC 7.8. Need to watch closely for infection but I suspect she may also be dry. Will give 500cc of fluid. I have touched base with PT regarding her f/u. They will see tomorrow.   Glori Bickers, MD  2:52 PM

## 2019-10-20 NOTE — Progress Notes (Signed)
PROGRESS NOTE    Beverly [name]  MQK:[id number] DOB: 11-03-1985 DOA: 09/24/2019 PCP: Patient, No Pcp Per     Brief Narrative: Per chart:" 34 y/o female with presented to the hospital for chest pain. She was 2 wks post emergency C section due to PROM. In the ED , her chest pain resolved but she subsequently had a VF arrest (polymorphin V tach and then V fib) with ROSC after defib x 3 and epi x 1. Immediately after arrest ST elevation was noted on EKG.  She was taken to the cath lab at [facility] where an aneurysmal dilatation (dissection?) of the LAD was identified. She was transferred to [facility] for ICU admission. LHC 8/17 >No coronary artery occlusion or critical stenosis. There is aneurysmal dilation of the ostial and proximal LAD with possible ulceration or dissection of uncertain chronicity. TIMI-3 flow is noted throughout the LAD and its branches. Severely reduced left ventricular systolic function with mid/apical anterior, apical, and apical inferior hypokinesis/akinesis. LV gram 30% EF.   Echo 8/18>LVEF 30-35%, septal and apical akinesis distal anterior wall an d mid/distal inferior wall hypokinesis hyperdynamic basal function. LV size mildly dilated.  MRI brain 8/23: Normal  8/30 underwent tracheostomy 8/30 PEG tube placement 9/10: Continues to be encephalopathic, able to open eyes and track but not following any commands and not talking,is moving extremities independently  Subjective: Patient is alert awake tracking with eyes, tries to smile but does not follow any commands/nonverbal.  Spontaneously is trying to move her extremities. Met the mother in the hallway. Remains on trach collar. Mildly tachycardic when she moves around Off flexiseal as stool is getting slightly more solid but on tube feed.  Assessment & Plan:  V FIb arrest (in hospital) in the setting of NSTEMI/anterior MI: [place] cardiology input.  Continue current cardiac meds Entresto dose adjusted.   On tele.  Monitor electrolytes intermittently.  CAD/anterior MI status post cardiac cath appears to have aneurysmal proximal LAD with dissection likely culprit.  Appreciate cardiology input.  Continue on current medical therapy with aspirin 81 mg, Lipitor 80 mg, Coreg.  Acute systolic congestive heart failure with EF 35%: 2/2 MI.  Compensated.  Entresto cut down 24-26 after discussing with  Cards 9/12 due to soft bp. Cont her coreg, digoxin spironolactone.On admit 227-230 lb.last weight t 212.  Acute anoxic encephalopathy/acute metabolic encephalopathy 2/2 V fib cardiac arrest: EEG 8/20 diffuse slowing but improved,  brain MRI normal x2.  Patient able to track, nonverbal spontaneously moving all extremities, does not follow instruction. Remains trach and PEG dependent  Hypokalemia/hypomagnesemia: Resolved  Hypertension: Blood pressure has been soft 90s to low 100.  On coreg Aldactone entresto.  Entresto dose was cut down to 59/51 on 9/8.  Monitor. cardiology to see if we need adjust meds  SVT in 8/27 - NSR. continue Coreg  Low-grade fever: Imaging question aspiration pneumonia, was on cefepime completed on 9/9.   Respiratory failure with trach collar in place.  Continue pulmonary hygiene, continue respiratory support.  Thrombocytosis: Monitor, plt peaked 722k> 572k monitor intermittently..  Diarrhea: Rectal tube with loose stool, on tube feed. Hold laxatives.  Continue as needed Imodium.   DVT prophylaxis: enoxaparin (LOVENOX) injection 40 mg Start: 10/07/19 1000 Place and maintain sequential compression device Start: 09/24/19 0136 Code Status:   Code Status: Full Code  Family Communication: plan of care discussed with patient's mother at bedside multiple occasions  Status is: Inpatient Remains inpatient appropriate because:IV treatments appropriate due to intensity of illness  or inability to take PO and Inpatient level of care appropriate due to severity of illness  Dispo: The patient  is from: Home              Anticipated d/c is to: LTAC vs SNF- wiil need trach in place for at least 30 days prior to going to SNF.              Anticipated d/c date is: Once bed available              Patient currently medically stable for LTAC placement  Nutrition: Diet Order    None      Nutrition Problem: Increased nutrient needs Etiology: post-op healing Signs/Symptoms: estimated needs Interventions: Tube feeding Body mass index is 30.46 kg/m.  Consultants:see note  Procedures:see note Microbiology:see note Blood Culture    Component Value Date/Time   SDES URINE, RANDOM 10/08/2019 1316   SPECREQUEST NONE 10/08/2019 1316   CULT  10/08/2019 1316    NO GROWTH Performed at [facility] [address], [place], [place] [address]    REPTSTATUS 10/09/2019 FINAL 10/08/2019 1316    Other culture-see note  Medications: Scheduled Meds: . aspirin  81 mg Per Tube Daily  . atorvastatin  80 mg Per Tube Daily  . carvedilol  6.25 mg Per Tube BID WC  . chlorhexidine gluconate (MEDLINE KIT)  15 mL Mouth Rinse BID  . Chlorhexidine Gluconate Cloth  6 each Topical Daily  . digoxin  0.125 mg Per Tube Daily  . enoxaparin (LOVENOX) injection  40 mg Subcutaneous Q24H  . feeding supplement (PROSource TF)  45 mL Per Tube QID  . insulin aspart  0-15 Units Subcutaneous Q4H  . mouth rinse  15 mL Mouth Rinse 10 times per day  . prenatal multivitamin  1 tablet Per Tube Q1200  . sacubitril-valsartan  1 tablet Per Tube BID  . scopolamine  1 patch Transdermal Q72H  . sodium chloride flush  10-40 mL Intracatheter Q12H  . spironolactone  25 mg Per Tube Daily   Continuous Infusions: . sodium chloride 10 mL/hr at 10/16/19 1600  . feeding supplement (OSMOLITE 1.5 CAL) 1,000 mL (10/20/19 0151)    Antimicrobials: Anti-infectives (From admission, onward)   Start     Dose/Rate Route Frequency Ordered Stop   10/09/19 2100  vancomycin (VANCOREADY) IVPB 1500 mg/300 mL  Status:  Discontinued         1,500 mg 150 mL/hr over 120 Minutes Intravenous Every 12 hours 10/09/19 1237 10/10/19 1121   10/09/19 0800  ceFEPIme (MAXIPIME) 2 g in sodium chloride 0.9 % 100 mL IVPB        2 g 200 mL/hr over 30 Minutes Intravenous Every 8 hours 10/09/19 0752 10/15/19 2147   10/09/19 0800  vancomycin (VANCOREADY) IVPB 2000 mg/400 mL        2,000 mg 200 mL/hr over 120 Minutes Intravenous  Once 10/09/19 0752 10/09/19 1147   10/07/19 1540  ceFAZolin (ANCEF) 2-4 GM/100ML-% IVPB       Note to Pharmacy: [name] Manges   : cabinet override      10/07/19 1540 10/08/19 0344   10/07/19 1400  ceFAZolin (ANCEF) IVPB 2g/100 mL premix        2 g 200 mL/hr over 30 Minutes Intravenous To Radiology 10/06/19 1339 10/07/19 1627   09/30/19 0300  vancomycin (VANCOCIN) IVPB 1000 mg/200 mL premix  Status:  Discontinued        1,000 mg 200 mL/hr over 60 Minutes  Intravenous Every 8 hours 09/29/19 1809 10/01/19 0853   09/29/19 1815  ceFEPIme (MAXIPIME) 2 g in sodium chloride 0.9 % 100 mL IVPB        2 g 200 mL/hr over 30 Minutes Intravenous Every 8 hours 09/29/19 1803 10/05/19 1801   09/29/19 1815  vancomycin (VANCOREADY) IVPB 2000 mg/400 mL        2,000 mg 200 mL/hr over 120 Minutes Intravenous  Once 09/29/19 1803 09/29/19 2137   09/24/19 2200  cefTRIAXone (ROCEPHIN) 2 g in sodium chloride 0.9 % 100 mL IVPB        2 g 200 mL/hr over 30 Minutes Intravenous Daily at bedtime 09/24/19 1201 09/28/19 2338   09/24/19 0330  cefTRIAXone (ROCEPHIN) 1 g in sodium chloride 0.9 % 100 mL IVPB  Status:  Discontinued        1 g 200 mL/hr over 30 Minutes Intravenous Daily at bedtime 09/24/19 0245 09/24/19 1201   09/24/19 0300  azithromycin (ZITHROMAX) 500 mg in sodium chloride 0.9 % 250 mL IVPB  Status:  Discontinued        500 mg 250 mL/hr over 60 Minutes Intravenous Daily at bedtime 09/24/19 0245 09/24/19 1103     Objective: Vitals: Today's Vitals   10/20/19 0812 10/20/19 0820 10/20/19 1143 10/20/19 1200  BP:  (!) 111/98   111/78  Pulse: (!) 110 (!) 101 96 (!) 121  Resp: 19 20 (!) 26 20  Temp:  98 F (36.7 C)  98.6 F (37 C)  TempSrc:  Axillary  Axillary  SpO2: 100% 99% 99% 100%  Weight:      Height:      PainSc:        Intake/Output Summary (Last 24 hours) at 10/20/2019 1205 Last data filed at 10/20/2019 0100 Gross per 24 hour  Intake 1830 ml  Output --  Net 1830 ml   Filed Weights   10/09/19 0342 10/10/19 0339 10/11/19 0443  Weight: 96 kg 96.6 kg 96.3 kg   Weight change:   Intake/Output from previous day: 09/12 0701 - 09/13 0700 In: 2030 [NG/GT:1880] Out: -  Intake/Output this shift: No intake/output data recorded.  Examination:  General exam: AA, nonverbal able to track, NAD, weak appearing. HEENT:Oral mucosa moist, Ear/Nose WNL grossly, dentition normal. Respiratory system: bilaterally clear, trach collar in place,no wheezing or crackles,no use of accessory muscle Cardiovascular system: S1 & S2 +, No JVD,. Gastrointestinal system: Abdomen soft, NT,ND, BS+. PEG+ no drainage. Nervous System:Alert, awake, moving extremities spontaneously,nonverbal does not follow instruction  Extremities: No edema, distal peripheral pulses palpable.  Skin: No rashes,no icterus. MSK: Normal muscle bulk,tone, power  Data Reviewed: I have personally reviewed following labs and imaging studies CBC: Recent Labs  Lab 10/15/19 0552 10/18/19 0513 10/19/19 0500 10/20/19 0500  WBC 7.4 9.6 8.4 7.8  HGB 8.3* 10.9* 10.4* 10.2*  HCT 27.6* 36.3 34.6* 33.9*  MCV 83.6 83.4 83.2 83.3  PLT 572* 713* 694* 161*   Basic Metabolic Panel: Recent Labs  Lab 10/14/19 0500 10/14/19 0500 10/15/19 0552 10/15/19 0552 10/16/19 0408 10/17/19 0330 10/18/19 0513 10/19/19 0500 10/20/19 0500  NA 140   < > 138   < > 137 138 136 137 139  K 3.9   < > 3.3*   < > 4.5 4.9 4.4 4.3 4.1  CL 104   < > 117*   < > 102 101 102 102 104  CO2 25   < > 21*   < > _0 26  GLUCOSE 125*   < > 119*   < > 142* 129* 147* 116* 140*   BUN 15   < > 12   < > 15 18 24* 25* 21*  CREATININE 0.41*   < > 0.37*   < > 0.44 0.42* 0.48 0.48 0.48  CALCIUM 9.8   < > 8.1*   < > 9.8 10.2 10.0 9.8 9.8  MG 1.8  --  1.6*  --   --   --   --   --   --    < > = values in this interval not displayed.   GFR: Estimated Creatinine Clearance: 126.9 mL/min (by C-G formula based on SCr of 0.48 mg/dL). Liver Function Tests: No results for input(s): AST, ALT, ALKPHOS, BILITOT, PROT, ALBUMIN in the last 168 hours. No results for input(s): LIPASE, AMYLASE in the last 168 hours. No results for input(s): AMMONIA in the last 168 hours. Coagulation Profile: No results for input(s): INR, PROTIME in the last 168 hours. Cardiac Enzymes: No results for input(s): CKTOTAL, CKMB, CKMBINDEX, TROPONINI in the last 168 hours. BNP (last 3 results) No results for input(s): PROBNP in the last 8760 hours. HbA1C: No results for input(s): HGBA1C in the last 72 hours. CBG: Recent Labs  Lab 10/19/19 2015 10/20/19 0008 10/20/19 0356 10/20/19 0816 10/20/19 1155  GLUCAP 113* 138* 129* 112* 106*   Lipid Profile: No results for input(s): CHOL, HDL, LDLCALC, TRIG, CHOLHDL, LDLDIRECT in the last 72 hours. Thyroid Function Tests: No results for input(s): TSH, T4TOTAL, FREET4, T3FREE, THYROIDAB in the last 72 hours. Anemia Panel: No results for input(s): VITAMINB12, FOLATE, FERRITIN, TIBC, IRON, RETICCTPCT in the last 72 hours. Sepsis Labs: No results for input(s): PROCALCITON, LATICACIDVEN in the last 168 hours.  No results found for this or any previous visit (from the past 240 hour(s)).   Radiology Studies: No results found.   LOS: 26 days   Antonieta Pert, MD [facility]  10/20/2019, 12:05 PM

## 2019-10-20 NOTE — Progress Notes (Signed)
  Speech Language Pathology Treatment:    Patient Details Name: Beverly [name] MRN: [id number] DOB: [birth year] Today's Date: 10/20/2019 Time: 9892-1194 SLP Time Calculation (min) (ACUTE ONLY): 21 min  Assessment / Plan / Recommendation Clinical Impression  Pt was seen for treatment with her mother present. Pt was more engaged and interactive than during prior sessions. She attempted to reposition herself in the bed and smiled slightly at the SLP following a joke. Pt required intermittent cueing to attend to her left but inconsistently oriented to that side with verbal stimulation. [name] was educated regarding the anatomy of the larynx, the impact of the trach on voicing, the goals of the session, and the plan of care. She verbalized understanding and all her questions were answered. Pt was repositioned with assistance from [place], [place]. Pt tolerated finger occlusion for brief periods without significant change in vitals but a "whoosh" of air was consistently noted following finger removal. Pt was repositioned further and it was ensured that the trach was appropriately positioned. This continued with brief, intermittent application of PMSV as was demonstrated on 9/7, suggesting continued air trapping and difficulty with accessing upper airway. On that date it was recommended that downsizing of pt's trach to #4 cuffless may be beneficial; this SLP is in agreement. Dr. Dayna Barker was contacted regarding this and PCCM was added to the discussion. SLP will continue to follow pt.     HPI HPI: Pt is a 34 y.o. female with PMH of HTN who was admitted 09/23/19 2 weeks post partum after emergent C section due to rupture of membranes with VF arrest. Pt with enterobacter PNA and encephalopathy, trach placed 8/30 and PEG on 8/31. MRI 8/31: No evidence of anoxic brain injury.      SLP Plan  Continue with current plan of care       Recommendations         Patient may use Passy-Muir Speech Valve: with SLP  only PMSV Supervision: Full MD: Please consider changing trach tube to : Cuffless;Smaller size         Oral Care Recommendations: Oral care QID Follow up Recommendations: LTACH SLP Visit Diagnosis: Aphonia (R49.1) Plan: Continue with current plan of care       Marcellene Shivley I. Vear Clock, MS, CCC-SLP Acute Rehabilitation Services Office number [phone] Pager [phone]                 [name] 10/20/2019, 5:32 PM

## 2019-10-21 LAB — BASIC METABOLIC PANEL
Anion gap: 8 (ref 5–15)
BUN: 16 mg/dL (ref 6–20)
CO2: 25 mmol/L (ref 22–32)
Calcium: 9.7 mg/dL (ref 8.9–10.3)
Chloride: 103 mmol/L (ref 98–111)
Creatinine, Ser: 0.46 mg/dL (ref 0.44–1.00)
GFR calc Af Amer: >60 mL/min (ref >60)
GFR calc non Af Amer: >60 mL/min (ref >60)
Glucose, Bld: 123 mg/dL — ABNORMAL HIGH (ref 70–99)
Potassium: 4 mmol/L (ref 3.5–5.1)
Sodium: 136 mmol/L (ref 135–145)

## 2019-10-21 LAB — GLUCOSE, CAPILLARY
Glucose-Capillary: 114 mg/dL — ABNORMAL HIGH (ref 70–99)
Glucose-Capillary: 131 mg/dL — ABNORMAL HIGH (ref 70–99)
Glucose-Capillary: 109 mg/dL — ABNORMAL HIGH (ref 70–99)
Glucose-Capillary: 103 mg/dL — ABNORMAL HIGH (ref 70–99)
Glucose-Capillary: 123 mg/dL — ABNORMAL HIGH (ref 70–99)

## 2019-10-21 LAB — CBC
HCT: 33.1 % — ABNORMAL LOW (ref 36.0–46.0)
Hemoglobin: 10.2 g/dL — ABNORMAL LOW (ref 12.0–15.0)
MCH: 26 pg (ref 26.0–34.0)
MCHC: 30.8 g/dL (ref 30.0–36.0)
MCV: 84.4 fL (ref 80.0–100.0)
Platelets: 568 10*3/uL — ABNORMAL HIGH (ref 150–400)
RBC: 3.92 MIL/uL (ref 3.87–5.11)
RDW: 17.2 % — ABNORMAL HIGH (ref 11.5–15.5)
WBC: 8.7 10*3/uL (ref 4.0–10.5)
nRBC: 0 % (ref 0.0–0.2)

## 2019-10-21 NOTE — Progress Notes (Signed)
NAME:  Beverly [name], MRN:  [id number], DOB:  Oct 20, 1985, LOS: 27 ADMISSION DATE:  09/24/2019, CONSULTATION DATE:  8/18 REFERRING MD:  Dr. Eldridge Dace, CHIEF COMPLAINT:  VF arrest   Brief History   34 year old female 2 weeks post partum presented with chest pain and suffered VF arrest.    History of present illness   Patient is encephalopathic and/or intubated. Therefore history has been obtained from chart review.  34 year old female with PMH as below, which is significant for HTN and 2 weeks post-partum. She had emergency C-section delivery due to premature rupture of membranes. She did have some gestational hypertension, but her pregnancy was otherwise uncomplicated. On 8/17 she developed dull chest pain shortly after eating, which prompted ED presentation at [facility]. Resolved spontaneously. No associated symptoms.   Past Medical History   has a past medical history of Hypertension and Vaginal Pap smear, abnormal.  Significant Hospital Events   8/17 chest pain, VF arrest; While in ED, she suffered VT > VF arrest with ROSC after defib x 3 and epi x 1. Immediately after arrest ST elevation was noted on EKG, which did improve on repeat. Differential ACS, SCAD, and toresades/metabolic. Electrolytes were replaced. She was taken to the cath lab at [facility]  Where aneurysmal dilation of the ostial and proximal LAD and was transferred to [facility] for ICU admission.  8/23 possibly febrile. Pan-cultures sent. Still on full support. Gets hypertensive when sedation let up. abx started later in day when temp >101 8/24 opens eyes to stimulus. + strong cough. Tolerating PSV but mental status still not supporting extubation  8/25 about the same  8/27: Seems to be moving a little bit more but mostly posturing.  Opening eyes more.  Had a run of SVT. 8/30 perc trach by PCCM  8/31 PEG with IR 9/2 started on vanc/cefepime for VAP coverage 9/4 transferred out of ICU  Consults:  PCCM   Procedures:    8/18> Central Line, removed 8/26 PICC line placed 8/30 trach 8/31 PEG  Significant Diagnostic Tests:  [facility] 8/17 > No coronary artery occlusion or critical stenosis. There is aneurysmal dilation of the ostial and proximal LAD with possible ulceration or dissection of uncertain chronicity.  TIMI-3 flow is noted throughout the LAD and its branches. Severely reduced left ventricular systolic function with mid/apical anterior, apical, and apical inferior hypokinesis/akinesis. LV gram 30% EF.  CT head 8/17 > normal  CTA chest 8/17 > no evidence of PE, moderate severity infiltrates vs ATX, small bilateral pleural effusions. Very small pericaradial effusion.  CT abdomen/pelvis 8/17 > Enlarged, heterogeneous uterus, Small amount of fluid seen in between the right kidney and right lobe of the liver. Echo 8/18> LVEF 30-35%, septal and apical akinesis distal anterior wall an d mid/distal inferior wall hypokinesis hyperdynamic basal function. LV size mildly dilated.  MRI brain 8/23: Normal  Micro Data:  See micro tab Blood 9/1 >> ngtd Sputum 9/1 >staph aureus, enterobacter aerogenes   Antimicrobials:   Vanc 9/2>> Cefepime 9/2>>  Interim history/subjective:   Improved secretion burden vs a few days ago per RNs SLP recommending PCCM eval for cuffless and or downsizing trach  Objective   Blood pressure 110/87, pulse 94, temperature 98 F (36.7 C), temperature source Axillary, resp. rate 20, height 5\' 10"  (1.778 m), weight 96.3 kg, SpO2 100 %, unknown if currently breastfeeding.    FiO2 (%):  [21 %] 21 %   Intake/Output Summary (Last 24 hours) at 10/21/2019  1246 Last data filed at 10/21/2019 0500 Gross per 24 hour  Intake 1740 ml  Output 0 ml  Net 1740 ml   Filed Weights   10/09/19 0342 10/10/19 0339 10/11/19 0443  Weight: 96 kg 96.6 kg 96.3 kg    Examination: GEN: wdwn adult F, NAD reclined in bed  HEENT: NCAT trach secure, trachea midline  CV: RRR s1s2 no rgm cap refill < 3sec  PULM:  Scattered upper lobe rhonchi. Symmetrical chest expansion, even and unlabored on ATC  GI: soft ndnt  EXT: Trace edema. No cyanosis or clubbing  NEURO: Moving extremities spontaneously, not following commands  SKIN: c/d/w without rash    Resolved Hospital Problem list    Assessment & Plan:    Acute Hypoxemic Respiratory Failure 2/2 to Cardiac Arrest, -c/bPulmonary Edema, Enterobacter and MSSA pneumonia (S) to cefepime.   - s/p tracheostomy 8/30, transferred out of ICU on ATC 9/4 P -Likely exchange for cuffless trach-- will discuss with PCCM physician RE downsizing or maintaining 6.0 (recent copious secretions have improved)  -PCCM will continue to follow for trach  -continues to tolerate ATC well    Best practice:  Diet: TF Pain/Anxiety/Delirium protocol (if indicated): not needed VAP protocol (if indicated): yes DVT prophylaxis: lovenox GI prophylaxis: n/a Glucose control: SSI Mobility:PT/OT Code Status: FULL  Family Communication: mother at bedside  Disposition: progressive    Tessie Fass MSN, AGACNP-BC [facility] [phone] If no answer, [phone] 10/21/2019, 1:30 PM

## 2019-10-21 NOTE — Progress Notes (Addendum)
ADVANCED HF PROGRESS  Patient Name: Beverly [name] Date of Encounter: 10/21/2019  [facility] Cardiologist: No primary care provider on file.   Subjective   Vital signs improved. NSR HR in the 70s. BP stable. No hypotension. SBPs 120s.   She is sleep on TC. Not following commands.   Labs pending.   Inpatient Medications    Scheduled Meds: . aspirin  81 mg Per Tube Daily  . atorvastatin  80 mg Per Tube Daily  . carvedilol  6.25 mg Per Tube BID WC  . chlorhexidine gluconate (MEDLINE KIT)  15 mL Mouth Rinse BID  . Chlorhexidine Gluconate Cloth  6 each Topical Daily  . digoxin  0.125 mg Per Tube Daily  . enoxaparin (LOVENOX) injection  40 mg Subcutaneous Q24H  . feeding supplement (PROSource TF)  45 mL Per Tube QID  . insulin aspart  0-15 Units Subcutaneous Q4H  . mouth rinse  15 mL Mouth Rinse 10 times per day  . prenatal multivitamin  1 tablet Per Tube Q1200  . sacubitril-valsartan  1 tablet Per Tube BID  . scopolamine  1 patch Transdermal Q72H  . sodium chloride flush  10-40 mL Intracatheter Q12H  . spironolactone  25 mg Per Tube Daily   Continuous Infusions: . sodium chloride 10 mL/hr at 10/16/19 1600  . feeding supplement (OSMOLITE 1.5 CAL) 60 mL/hr at 10/21/19 0500   PRN Meds: acetaminophen (TYLENOL) oral liquid 160 mg/5 mL, diphenoxylate-atropine, hydrALAZINE, sodium chloride flush   Vital Signs    Vitals:   10/21/19 0200 10/21/19 0300 10/21/19 0359 10/21/19 0742  BP: (!) 92/54 103/73 (!) 92/58 108/82  Pulse: 78 88 76 100  Resp: _0 Temp:   98.4 F (36.9 C) 98.1 F (36.7 C)  TempSrc:   Oral Axillary  SpO2:   100% 100%  Weight:      Height:        Intake/Output Summary (Last 24 hours) at 10/21/2019 0809 Last data filed at 10/21/2019 0500 Gross per 24 hour  Intake 1740 ml  Output 0 ml  Net 1740 ml   Last 3 Weights 10/11/2019 10/10/2019 10/09/2019  Weight (lbs) 212 lb 4.9 oz 212 lb 15.4 oz 211 lb 10.3 oz  Weight (kg) 96.3 kg 96.6 kg 96 kg    Some encounter information is confidential and restricted. Go to Review Flowsheets activity to see all data.       Telemetry   NSR 70s    Physical Exam   General:  Sleep on TC. Not following commands.  HEENT: normal Neck: supple. + TC. No JVD. Carotids 2+ bilat; no bruits. No lymphadenopathy or thryomegaly appreciated.  Cor: PMI nondisplaced. Regular rate & rhythm. No rubs, gallops or murmurs. Lungs: clear bilaterally, no wheezing  Abdomen: soft, nontender, nondistended. No hepatosplenomegaly. No bruits or masses. Good bowel sounds. Extremities: no cyanosis, clubbing, rash, edema, distal extremities (hands and feet) cool to touch  Neuro: not following commands   Labs    High Sensitivity Troponin:   Recent Labs  Lab 09/23/19 1539 09/23/19 1816 09/24/19 0039 09/24/19 0826  TROPONINIHS 127* 2,078* 24,421* >27,000*      Chemistry Recent Labs  Lab 10/18/19 0513 10/19/19 0500 10/20/19 0500  NA 136 137 139  K 4.4 4.3 4.1  CL 102 102 104  CO2 _1 GLUCOSE 147* 116* 140*  BUN 24* 25* 21*  CREATININE 0.48 0.48 0.48  CALCIUM 10.0 9.8 9.8  GFRNONAA >60 >60 >60  GFRAA >  60 >60 >60  ANIONGAP 9 10 9     Hematology Recent Labs  Lab 10/18/19 0513 10/19/19 0500 10/20/19 0500  WBC 9.6 8.4 7.8  RBC 4.35 4.16 4.07  HGB 10.9* 10.4* 10.2*  HCT 36.3 34.6* 33.9*  MCV 83.4 83.2 83.3  MCH 25.1* 25.0* 25.1*  MCHC 30.0 30.1 30.1  RDW 17.1* 17.2* 17.0*  PLT 713* 694* 641*    BNP Recent Labs  Lab 10/15/19 0552  BNP 168.9*     DDimer No results for input(s): DDIMER in the last 168 hours.   Radiology    No results found.  Cardiac Studies   See section above (reviewed)  Patient Profile     34 y.o. female 2 weeks post-partum with VF/torsades arrest in the setting of anterior MI  Assessment & Plan    1. VF arrest (in-hospital): in setting of anterior infarct. - Rhythm stable unfortunately with severe anoxic brain injury. (see below) - EEG 8/20 diffuse  slowing but improved - Brain MRI normal x 2. EEG still with diffuse slowing - Remains on vent through trach.  - VT quiescent. Keep K> 4.0 Mg > 2.0  2. Acute systolic heart failure: secondary to anterior MI, 09/24/19 LVEF 35%.  -Volume status stable.  - On carvedilol 6.25 bid - Continue Entresto 24-26 mg bid dose cut back due to soft BP.  - Continue dig 0.125  - Continue spiro 25 mg daily.  3. CAD/Anterior MI: angio reviewed again. Appears to have aneurysmal proximal LAD with dissection, likely culprit. Had TIMI-3 flow at cath but large infarct.  - Continue ASA,  beta-blocker and statin.  - Continue medical therapy for now 4. Encephalopathy, post-arrest. Likely anoxic brain injury  - MRI 09/29/19 & 10/07/19 negative.  - EEG 8/31 EEGwith diffuse slowing 5. Hypokalemia/hypomag - BMP pending  6. Hypertension - stable today  7. Fevers -Remains afebrile.  - Abx per TRH 8. SVT on 10/03/19 - Resolved. Continue current dose of carvedilol.      Signed, Brittainy Simmons, PA-C  10/21/2019, 8:09 AM    Patient seen and examined with the above-signed Advanced Practice Provider and/or Housestaff. I personally reviewed laboratory data, imaging studies and relevant notes. I independently examined the patient and formulated the important aspects of the plan. I have edited the note to reflect any of my changes or salient points. I have personally discussed the plan with the patient and/or family.  Vitals improved with 500cc NS yesterday. Wil continue to watch volume status closely. Remains awake and somewhat expressive. Still will not follow commands  General:  Awake on trach collar HEENT: normal Neck: supple. no JVD. Carotids 2+ bilat; no bruits. No lymphadenopathy or thryomegaly appreciated. Cor: PMI nondisplaced. Regular rate & rhythm. No rubs, gallops or murmurs. Lungs: clear Abdomen: soft, nontender, nondistended. No hepatosplenomegaly. No bruits or masses. Good bowel sounds. Extremities: no  cyanosis, clubbing, rash, edema Neuro: awake. Will track some not following commands  Vitals improved with IVF. Labs pending. Will continue to follow for Neuro recovery.  Daniel Bensimhon, MD  9:53 AM      

## 2019-10-21 NOTE — Progress Notes (Signed)
Physical Therapy Treatment Patient Details Name: Beverly [name] MRN: [id number] DOB: 05-03-1985 Today's Date: 10/21/2019    History of Present Illness Pt is 34 yo admitted 09/23/19 2 weeks post partum after emergent C section due to rupture of membranes with VF arrest. Pt with enterobacter PNA, trach 8/30, PEG, encephalopathy. PMHx: HTN    PT Comments    Pt moving both UEs and LEs upon arrival to room, does not follow commands with mobility so requires total assist +2 for all mobility at this time. Pt sat EOB ~5 minutes with bilateral elbow propping, AP leaning tasks, but requires significant physical assist to right balance. PT continuing to recommend SNF vs LTACH post-acutely, will continue to follow.    Follow Up Recommendations  LTACH;SNF;Supervision/Assistance - 24 hour     Equipment Recommendations  Wheelchair (measurements PT);Hospital bed    Recommendations for Other Services       Precautions / Restrictions Precautions Precautions: Fall Precaution Comments: trach, peg, incontinence Restrictions Weight Bearing Restrictions: No    Mobility  Bed Mobility Overal bed mobility: Needs Assistance Bed Mobility: Rolling;Supine to Sit;Sit to Supine Rolling: +2 for physical assistance;Total assist   Supine to sit: Total assist;+2 for physical assistance Sit to supine: Total assist;+2 for physical assistance   General bed mobility comments: Total assist for trunk and LE management, preference for extending trunk during mobility.  Transfers                 General transfer comment: not appropriate to attempt   Ambulation/Gait                 Stairs             Wheelchair Mobility    Modified Rankin (Stroke Patients Only)       Balance Overall balance assessment: Needs assistance Sitting-balance support: Bilateral upper extremity supported;Feet supported Sitting balance-Leahy Scale: Zero Sitting balance - Comments: elicited righting reaction  when displaced posterior x 1, but unable to engage core enough to prevent LOB. Preference for posterior leaning Postural control: Posterior lean                                  Cognition Arousal/Alertness: Awake/alert Behavior During Therapy: Flat affect (smiles slightly at times) Overall Cognitive Status: Impaired/Different from baseline                 Rancho Levels of Cognitive Functioning [place] Mirant Scales of Cognitive Functioning: Generalized response               General Comments: non verbal, does not follow commands, makes good eye contact and tracks PT/OT. Pt with periods of smiling during session.      Exercises Other Exercises Other Exercises: facilitated B scapular depression in sitting, B scapular mobilization in sidelying    General Comments General comments (skin integrity, edema, etc.): vss, required total assist for pericare      Pertinent Vitals/Pain Pain Assessment: No/denies pain Faces Pain Scale: No hurt Pain Intervention(s): Monitored during session    Home Living                      Prior Function            PT Goals (current goals can now be found in the care plan section) Acute Rehab PT Goals Patient Stated Goal: unable to state PT Goal Formulation: Patient unable to participate  in goal setting Time For Goal Achievement: 11/04/19 Potential to Achieve Goals: Fair Progress towards PT goals: Progressing toward goals    Frequency    Min 2X/week      PT Plan Current plan remains appropriate    Co-evaluation PT/OT/SLP Co-Evaluation/Treatment: Yes Reason for Co-Treatment: For patient/therapist safety;To address functional/ADL transfers;Necessary to address cognition/behavior during functional activity PT goals addressed during session: Mobility/safety with mobility OT goals addressed during session: Strengthening/ROM      AM-PAC PT "6 Clicks" Mobility   Outcome Measure  Help needed turning  from your back to your side while in a flat bed without using bedrails?: Total Help needed moving from lying on your back to sitting on the side of a flat bed without using bedrails?: Total Help needed moving to and from a bed to a chair (including a wheelchair)?: Total Help needed standing up from a chair using your arms (e.g., wheelchair or bedside chair)?: Total Help needed to walk in hospital room?: Total Help needed climbing 3-5 steps with a railing? : Total 6 Click Score: 6    End of Session Equipment Utilized During Treatment: Oxygen Activity Tolerance: Patient tolerated treatment well;Patient limited by fatigue Patient left: in bed;with bed alarm set;with call bell/phone within reach Nurse Communication: Mobility status;Need for lift equipment PT Visit Diagnosis: Other abnormalities of gait and mobility (R26.89);Difficulty in walking, not elsewhere classified (R26.2);Other symptoms and signs involving the nervous system (Y09.983)     Time: 3825-0539 PT Time Calculation (min) (ACUTE ONLY): 30 min  Charges:  $Therapeutic Activity: 8-22 mins                     Quantina Dershem E, PT Acute Rehabilitation Services Pager [phone]  Office [phone]     Montee Tallman D Despina Hidden 10/21/2019, 2:19 PM

## 2019-10-21 NOTE — Progress Notes (Signed)
PROGRESS NOTE    Beverly [name]  XYI:[id number] DOB: 02-18-1985 DOA: 09/24/2019 PCP: Patient, No Pcp Per     Brief Narrative: Per chart:" 34 y/o female with presented to the hospital for chest pain. She was 2 wks post emergency C section due to PROM. In the ED , her chest pain resolved but she subsequently had a VF arrest (polymorphin V tach and then V fib) with ROSC after defib x 3 and epi x 1. Immediately after arrest ST elevation was noted on EKG.  She was taken to the cath lab at [facility] where an aneurysmal dilatation (dissection?) of the LAD was identified. She was transferred to [facility] for ICU admission. LHC 8/17 >No coronary artery occlusion or critical stenosis. There is aneurysmal dilation of the ostial and proximal LAD with possible ulceration or dissection of uncertain chronicity. TIMI-3 flow is noted throughout the LAD and its branches. Severely reduced left ventricular systolic function with mid/apical anterior, apical, and apical inferior hypokinesis/akinesis. LV gram 30% EF.   Echo 8/18>LVEF 30-35%, septal and apical akinesis distal anterior wall an d mid/distal inferior wall hypokinesis hyperdynamic basal function. LV size mildly dilated.  MRI brain 8/23: Normal  8/30 underwent tracheostomy 8/30 PEG tube placement 9/10: Continues to be encephalopathic, able to open eyes and track but not following any commands and not talking,is moving extremities independently  Subjective:  Seen this morning.  Woke up on talk to her, able to track not following commands.   Nursing reports she has been incontinent with semisolid stool.   On trach collar.   No more hypotension,Entresto was cut down couple of days ago.  Assessment & Plan:  V FIb arrest (in hospital) in the setting of NSTEMI/anterior MI: [place] cardiology input.  Continue current cardiac meds Entresto dose adjusted for hypotension.  On  Tele, cardiology following  CAD/anterior MI status post cardiac  cath appears to have aneurysmal proximal LAD with dissection likely culprit.  Appreciate cardiology input.  Continue current aspirin 81 Lipitor 50 mg, beta-blocker and Entresto  Acute systolic congestive heart failure with EF 35%: 2/2 MI.  Compensated.  Entresto cut down 24-26 after discussing with  Cards 9/12 due to soft bp. Cont her coreg, digoxin spironolactone.On admit 227-230 lb.last weight t 212.  Acute anoxic encephalopathy/acute metabolic encephalopathy 2/2 V fib cardiac arrest: EEG 8/20 diffuse slowing but improved,  brain MRI normal x2.  Patient able to track, nonverbal spontaneously moving all extremities, does not follow instruction. Remains trach and PEG dependent  Hypokalemia/hypomagnesemia: Resolved  Hypertension: No more hypotension.  Entresto dose was cut down 24-26 on 9/13, cont on coreg Aldactone entresto.  Appreciate cardiology input.    SVT in 8/27 - NSR. continue Coreg  Low-grade fever: Resolved.  Imaging question aspiration pneumonia, was on cefepime completed on 9/9.   Respiratory failure with trach collar in place.  Continue pulmonary hygiene, continue respiratory support speech following closely, requesting downsizing to # 4 cuff notified PCCM  Thrombocytosis: Monitor, plt peaked 722k> 572k > 648> 568. monitor intermittently..  Diarrhea: Rectal tube with loose stool, on tube feed. Hold laxatives.  Continue as needed Imodium.   DVT prophylaxis: enoxaparin (LOVENOX) injection 40 mg Start: 10/07/19 1000 Place and maintain sequential compression device Start: 09/24/19 0136 Code Status:   Code Status: Full Code  Family Communication: plan of care discussed with patient's mother at bedside multiple occasions  Status is: Inpatient Remains inpatient appropriate because:IV treatments appropriate due to intensity of illness or inability to take PO  and Inpatient level of care appropriate due to severity of illness  Dispo: The patient is from: Home              Anticipated  d/c is to: LTAC vs SNF- PER CARE TEAM wiil need trach in place for at least 30 days prior to going to SNF.              Anticipated d/c date is: Once bed available              Patient currently medically stable for SNF  Nutrition: Diet Order    None      Nutrition Problem: Increased nutrient needs Etiology: post-op healing Signs/Symptoms: estimated needs Interventions: Tube feeding Body mass index is 30.46 kg/m.  Consultants:see note  Procedures:see note Microbiology:see note Blood Culture    Component Value Date/Time   SDES URINE, RANDOM 10/08/2019 1316   SPECREQUEST NONE 10/08/2019 1316   CULT  10/08/2019 1316    NO GROWTH Performed at [facility] [address] [place], [place] [address]    REPTSTATUS 10/09/2019 FINAL 10/08/2019 1316    Other culture-see note  Medications: Scheduled Meds: . aspirin  81 mg Per Tube Daily  . atorvastatin  80 mg Per Tube Daily  . carvedilol  6.25 mg Per Tube BID WC  . chlorhexidine gluconate (MEDLINE KIT)  15 mL Mouth Rinse BID  . Chlorhexidine Gluconate Cloth  6 each Topical Daily  . digoxin  0.125 mg Per Tube Daily  . enoxaparin (LOVENOX) injection  40 mg Subcutaneous Q24H  . feeding supplement (PROSource TF)  45 mL Per Tube QID  . insulin aspart  0-15 Units Subcutaneous Q4H  . mouth rinse  15 mL Mouth Rinse 10 times per day  . prenatal multivitamin  1 tablet Per Tube Q1200  . sacubitril-valsartan  1 tablet Per Tube BID  . scopolamine  1 patch Transdermal Q72H  . sodium chloride flush  10-40 mL Intracatheter Q12H  . spironolactone  25 mg Per Tube Daily   Continuous Infusions: . sodium chloride 10 mL/hr at 10/16/19 1600  . feeding supplement (OSMOLITE 1.5 CAL) 60 mL/hr at 10/21/19 0500    Antimicrobials: Anti-infectives (From admission, onward)   Start     Dose/Rate Route Frequency Ordered Stop   10/09/19 2100  vancomycin (VANCOREADY) IVPB 1500 mg/300 mL  Status:  Discontinued        1,500 mg 150 mL/hr over 120  Minutes Intravenous Every 12 hours 10/09/19 1237 10/10/19 1121   10/09/19 0800  ceFEPIme (MAXIPIME) 2 g in sodium chloride 0.9 % 100 mL IVPB        2 g 200 mL/hr over 30 Minutes Intravenous Every 8 hours 10/09/19 0752 10/15/19 2147   10/09/19 0800  vancomycin (VANCOREADY) IVPB 2000 mg/400 mL        2,000 mg 200 mL/hr over 120 Minutes Intravenous  Once 10/09/19 0752 10/09/19 1147   10/07/19 1540  ceFAZolin (ANCEF) 2-4 GM/100ML-% IVPB       Note to Pharmacy: [name] Manges   : cabinet override      10/07/19 1540 10/08/19 0344   10/07/19 1400  ceFAZolin (ANCEF) IVPB 2g/100 mL premix        2 g 200 mL/hr over 30 Minutes Intravenous To Radiology 10/06/19 1339 10/07/19 1627   09/30/19 0300  vancomycin (VANCOCIN) IVPB 1000 mg/200 mL premix  Status:  Discontinued        1,000 mg 200 mL/hr over 60 Minutes Intravenous Every  8 hours 09/29/19 1809 10/01/19 0853   09/29/19 1815  ceFEPIme (MAXIPIME) 2 g in sodium chloride 0.9 % 100 mL IVPB        2 g 200 mL/hr over 30 Minutes Intravenous Every 8 hours 09/29/19 1803 10/05/19 1801   09/29/19 1815  vancomycin (VANCOREADY) IVPB 2000 mg/400 mL        2,000 mg 200 mL/hr over 120 Minutes Intravenous  Once 09/29/19 1803 09/29/19 2137   09/24/19 2200  cefTRIAXone (ROCEPHIN) 2 g in sodium chloride 0.9 % 100 mL IVPB        2 g 200 mL/hr over 30 Minutes Intravenous Daily at bedtime 09/24/19 1201 09/28/19 2338   09/24/19 0330  cefTRIAXone (ROCEPHIN) 1 g in sodium chloride 0.9 % 100 mL IVPB  Status:  Discontinued        1 g 200 mL/hr over 30 Minutes Intravenous Daily at bedtime 09/24/19 0245 09/24/19 1201   09/24/19 0300  azithromycin (ZITHROMAX) 500 mg in sodium chloride 0.9 % 250 mL IVPB  Status:  Discontinued        500 mg 250 mL/hr over 60 Minutes Intravenous Daily at bedtime 09/24/19 0245 09/24/19 1103     Objective: Vitals: Today's Vitals   10/21/19 0200 10/21/19 0300 10/21/19 0359 10/21/19 0742  BP: (!) 92/54 103/73 (!) 92/58 108/82  Pulse: 78 88 76  100  Resp: _0 Temp:   98.4 F (36.9 C) 98.1 F (36.7 C)  TempSrc:   Oral Axillary  SpO2:   100% 100%  Weight:      Height:      PainSc:   0-No pain     Intake/Output Summary (Last 24 hours) at 10/21/2019 1133 Last data filed at 10/21/2019 0500 Gross per 24 hour  Intake 1740 ml  Output 0 ml  Net 1740 ml   Filed Weights   10/09/19 0342 10/10/19 0339 10/11/19 0443  Weight: 96 kg 96.6 kg 96.3 kg   Weight change:   Intake/Output from previous day: 09/13 0701 - 09/14 0700 In: 1740 [NG/GT:1680] Out: 0  Intake/Output this shift: No intake/output data recorded.  Examination:  General exam: AA, nonverbal, does not follow commands, intermittently moving extremities.   HEENT:Oral mucosa moist, Ear/Nose WNL grossly, dentition normal. Respiratory system: bilaterally clear,no wheezing or crackles,no use of accessory muscle. On TC Cardiovascular system: S1 & S2 +, No JVD,. Gastrointestinal system: Abdomen soft, NT,ND, BS+. Peg+ Nervous System:Alert, awake, moving extremities and grossly nonfocal Extremities: No edema, distal peripheral pulses palpable.  Skin: No rashes,no icterus. MSK: Normal muscle bulk,tone, power  Data Reviewed: I have personally reviewed following labs and imaging studies CBC: Recent Labs  Lab 10/15/19 0552 10/18/19 0513 10/19/19 0500 10/20/19 0500  WBC 7.4 9.6 8.4 7.8  HGB 8.3* 10.9* 10.4* 10.2*  HCT 27.6* 36.3 34.6* 33.9*  MCV 83.6 83.4 83.2 83.3  PLT 572* 713* 694* 462*   Basic Metabolic Panel: Recent Labs  Lab 10/15/19 0552 10/15/19 0552 10/16/19 0408 10/17/19 0330 10/18/19 0513 10/19/19 0500 10/20/19 0500  NA 138   < > 137 138 136 137 139  K 3.3*   < > 4.5 4.9 4.4 4.3 4.1  CL 117*   < > 102 101 102 102 104  CO2 21*   < > _1 GLUCOSE 119*   < > 142* 129* 147* 116* 140*  BUN 12   < > 15 18 24* 25* 21*  CREATININE 0.37*   < > 0.44  0.42* 0.48 0.48 0.48  CALCIUM 8.1*   < > 9.8 10.2 10.0 9.8 9.8  MG 1.6*  --   --   --    --   --   --    < > = values in this interval not displayed.   GFR: Estimated Creatinine Clearance: 126.9 mL/min (by C-G formula based on SCr of 0.48 mg/dL). Liver Function Tests: No results for input(s): AST, ALT, ALKPHOS, BILITOT, PROT, ALBUMIN in the last 168 hours. No results for input(s): LIPASE, AMYLASE in the last 168 hours. No results for input(s): AMMONIA in the last 168 hours. Coagulation Profile: No results for input(s): INR, PROTIME in the last 168 hours. Cardiac Enzymes: No results for input(s): CKTOTAL, CKMB, CKMBINDEX, TROPONINI in the last 168 hours. BNP (last 3 results) No results for input(s): PROBNP in the last 8760 hours. HbA1C: No results for input(s): HGBA1C in the last 72 hours. CBG: Recent Labs  Lab 10/20/19 1649 10/20/19 1954 10/20/19 2333 10/21/19 0401 10/21/19 0809  GLUCAP 115* 102* 138* 114* 131*   Lipid Profile: No results for input(s): CHOL, HDL, LDLCALC, TRIG, CHOLHDL, LDLDIRECT in the last 72 hours. Thyroid Function Tests: No results for input(s): TSH, T4TOTAL, FREET4, T3FREE, THYROIDAB in the last 72 hours. Anemia Panel: No results for input(s): VITAMINB12, FOLATE, FERRITIN, TIBC, IRON, RETICCTPCT in the last 72 hours. Sepsis Labs: No results for input(s): PROCALCITON, LATICACIDVEN in the last 168 hours.  No results found for this or any previous visit (from the past 240 hour(s)).   Radiology Studies: No results found.   LOS: 44 days   Antonieta Pert, MD [facility]  10/21/2019, 11:33 AM

## 2019-10-21 NOTE — Progress Notes (Signed)
Occupational Therapy Treatment Patient Details Name: Beverly [name] MRN: [id number] DOB: [birth year] Today's Date: 10/21/2019    History of present illness Pt is 34 yo admitted 09/23/19 2 weeks post partum after emergent C section due to rupture of membranes with VF arrest. Pt with enterobacter PNA, trach 8/30, PEG, encephalopathy. PMHx: HTN   OT comments  Pt restless in bed upon arrival. Rolled for pericare and linen change and sat EOB with +2 total assist. Pt initially with strong posterior lean in sitting, but improved to requiring max assist for balance. Pt demonstrating righting reaction when displaced posterior x 1, but ineffective to prevent LOB. B scapular mobilization performed in sidelying. Pt closing her eyes and calm at end of session.   Follow Up Recommendations  LTACH,SNF;Supervision/Assistance - 24 hour    Equipment Recommendations       Recommendations for Other Services      Precautions / Restrictions Precautions Precautions: Fall Precaution Comments: trach, peg, incontinence       Mobility Bed Mobility   Bed Mobility: Rolling;Supine to Sit;Sit to Supine Rolling: +2 for physical assistance;Total assist   Supine to sit: Total assist;+2 for physical assistance Sit to supine: Total assist;+2 for physical assistance   General bed mobility comments: assist for all aspects, rolls with less extension toward R  Transfers                 General transfer comment: not appropriate to attempt     Balance Overall balance assessment: Needs assistance Sitting-balance support: Bilateral upper extremity supported;Feet supported Sitting balance-Leahy Scale: Zero Sitting balance - Comments: elicited righting reaction when displaced posterior x 1, but unable to engage core to preven LOB                                   ADL either performed or assessed with clinical judgement   ADL                               Toileting- Clothing  Manipulation and Hygiene: Total assistance Toileting - Clothing Manipulation Details (indicate cue type and reason): Total A for peri care at bed level             Vision       Perception     Praxis      Cognition Arousal/Alertness: Awake/alert Behavior During Therapy: Flat affect (smiles slightly at times) Overall Cognitive Status: Impaired/Different from baseline                                 General Comments: non verbal, does not follow commands, makes good eye contact and tracks therapist         Exercises Exercises: Other exercises Other Exercises Other Exercises: facilitated B scapular depression in sitting, B scapular mobilization in sidelying   Shoulder Instructions       General Comments      Pertinent Vitals/ Pain       Pain Assessment: Faces Faces Pain Scale: No hurt  Home Living                                          Prior Functioning/Environment  Frequency  Min 2X/week        Progress Toward Goals  OT Goals(current goals can now be found in the care plan section)  Progress towards OT goals: Progressing toward goals  Acute Rehab OT Goals Patient Stated Goal: unable to state OT Goal Formulation: Patient unable to participate in goal setting Time For Goal Achievement: 11/04/19 Potential to Achieve Goals: Fair  Plan Discharge plan remains appropriate    Co-evaluation    PT/OT/SLP Co-Evaluation/Treatment: Yes Reason for Co-Treatment: For patient/therapist safety   OT goals addressed during session: Strengthening/ROM      AM-PAC OT "6 Clicks" Daily Activity     Outcome Measure   Help from another person eating meals?: Total Help from another person taking care of personal grooming?: Total Help from another person toileting, which includes using toliet, bedpan, or urinal?: Total Help from another person bathing (including washing, rinsing, drying)?: Total Help from another person  to put on and taking off regular upper body clothing?: Total Help from another person to put on and taking off regular lower body clothing?: Total 6 Click Score: 6    End of Session Equipment Utilized During Treatment: Oxygen (5L 28%)  OT Visit Diagnosis: Other symptoms and signs involving cognitive function;Muscle weakness (generalized) (M62.81)   Activity Tolerance Patient tolerated treatment well   Patient Left in bed;with call bell/phone within reach;with bed alarm set   Nurse Communication          Time: 6962-9528 OT Time Calculation (min): 32 min  Charges: OT General Charges $OT Visit: 1 Visit OT Treatments $Therapeutic Activity: 8-22 mins  Martie Round, OTR/L Acute Rehabilitation Services Pager: [phone] Office: [phone] Evern Bio 10/21/2019, 1:13 PM

## 2019-10-21 NOTE — Progress Notes (Signed)
Nutrition Follow-up  DOCUMENTATION CODES:   Not applicable  INTERVENTION:   Continue tube feeding: -Osmolite 1.5 @ 60  ml/hr via PEG (1440 ml) -45 ml ProSource QID  Provides: 2320 kcals, 134 grams protein, 1097 ml free water.   NUTRITION DIAGNOSIS:   Increased nutrient needs related to post-op healing as evidenced by estimated needs.  Ongoing  GOAL:   Patient will meet greater than or equal to 90% of their needs  Addressed via TF  MONITOR:   Vent status, Skin, TF tolerance, I & O's, Labs, Weight trends  REASON FOR ASSESSMENT:   Ventilator    ASSESSMENT:   Patient with PMH significant for HTN and 2 weeks post partum emergency C-section. Presents this admission with new onset peripartum cardiomyopathy with VF arrest.   8/30- trach, PEG  Remains on trach collar. Not following commands. Awaiting neuro recovery. Plan cuffless trach exchange. Tolerating tube feeding at goal. Monitor weight trends.   Admission weight: 103.1 kg  Current weight: 96.3 kg   Medications: SS novolog, prenatal, aldactone Labs: CBG 102-138  Diet Order:   Diet Order    None      EDUCATION NEEDS:   Not appropriate for education at this time  Skin:  Skin Assessment: Reviewed RN Assessment  Last BM:  9/13  Height:   Ht Readings from Last 1 Encounters:  09/24/19 5\' 10"  (1.778 m)    Weight:   Wt Readings from Last 1 Encounters:  10/11/19 96.3 kg    BMI:  Body mass index is 30.46 kg/m.  Estimated Nutritional Needs:   Kcal:  2150-2350 kcal  Protein:  115-130 grams  Fluid:  >/= 2 L/day   12/11/19 RD, LDN Clinical Nutrition Pager listed in AMION

## 2019-10-22 DIAGNOSIS — I469 Cardiac arrest, cause unspecified: Secondary | ICD-10-CM | POA: Diagnosis not present

## 2019-10-22 LAB — BASIC METABOLIC PANEL
Anion gap: 9 (ref 5–15)
BUN: 15 mg/dL (ref 6–20)
CO2: 26 mmol/L (ref 22–32)
Calcium: 10 mg/dL (ref 8.9–10.3)
Chloride: 103 mmol/L (ref 98–111)
Creatinine, Ser: 0.52 mg/dL (ref 0.44–1.00)
GFR calc Af Amer: >60 mL/min (ref >60)
GFR calc non Af Amer: >60 mL/min (ref >60)
Glucose, Bld: 125 mg/dL — ABNORMAL HIGH (ref 70–99)
Potassium: 3.9 mmol/L (ref 3.5–5.1)
Sodium: 138 mmol/L (ref 135–145)

## 2019-10-22 LAB — GLUCOSE, CAPILLARY
Glucose-Capillary: 101 mg/dL — ABNORMAL HIGH (ref 70–99)
Glucose-Capillary: 102 mg/dL — ABNORMAL HIGH (ref 70–99)
Glucose-Capillary: 103 mg/dL — ABNORMAL HIGH (ref 70–99)
Glucose-Capillary: 111 mg/dL — ABNORMAL HIGH (ref 70–99)
Glucose-Capillary: 115 mg/dL — ABNORMAL HIGH (ref 70–99)
Glucose-Capillary: 120 mg/dL — ABNORMAL HIGH (ref 70–99)
Glucose-Capillary: 124 mg/dL — ABNORMAL HIGH (ref 70–99)

## 2019-10-22 NOTE — Procedures (Signed)
Tracheostomy Change Note  Patient Details:   Name: Beverly [name] DOB: 11/23/1985 MRN: [id number]    Airway Documentation:     Evaluation  O2 sats: stable throughout Complications: No apparent complications Patient did tolerate procedure well. Bilateral Breath Sounds: Clear     Trach downsized to #4 cfs. No complications noted. ETCO2 detector positive for color change. BBS equal. Vitals remained stable throughout the procedure.   Guss Bunde 10/22/2019, 11:26 AM

## 2019-10-22 NOTE — Progress Notes (Addendum)
ADVANCED HF PROGRESS  Patient Name: Beverly [name] Date of Encounter: 10/22/2019  [facility] Cardiologist: No primary care provider on file.   Subjective   No events overnight.   Awake and alert today but not following commands.   RT plans to downsize TC today.   Continues w/ tube feeds.   Labs and VSS.   Inpatient Medications    Scheduled Meds: . aspirin  81 mg Per Tube Daily  . atorvastatin  80 mg Per Tube Daily  . carvedilol  6.25 mg Per Tube BID WC  . chlorhexidine gluconate (MEDLINE KIT)  15 mL Mouth Rinse BID  . Chlorhexidine Gluconate Cloth  6 each Topical Daily  . digoxin  0.125 mg Per Tube Daily  . enoxaparin (LOVENOX) injection  40 mg Subcutaneous Q24H  . feeding supplement (PROSource TF)  45 mL Per Tube QID  . insulin aspart  0-15 Units Subcutaneous Q4H  . mouth rinse  15 mL Mouth Rinse 10 times per day  . prenatal multivitamin  1 tablet Per Tube Q1200  . sacubitril-valsartan  1 tablet Per Tube BID  . scopolamine  1 patch Transdermal Q72H  . sodium chloride flush  10-40 mL Intracatheter Q12H  . spironolactone  25 mg Per Tube Daily   Continuous Infusions: . sodium chloride 10 mL/hr at 10/16/19 1600  . feeding supplement (OSMOLITE 1.5 CAL) 1,000 mL (10/21/19 1716)   PRN Meds: acetaminophen (TYLENOL) oral liquid 160 mg/5 mL, diphenoxylate-atropine, hydrALAZINE, sodium chloride flush   Vital Signs    Vitals:   10/22/19 0600 10/22/19 0700 10/22/19 0800 10/22/19 0811  BP: 106/81 104/73 102/66   Pulse: 85 79 78 100  Resp: _0 (!) 21  Temp:      TempSrc:      SpO2:    96%  Weight:      Height:        Intake/Output Summary (Last 24 hours) at 10/22/2019 0827 Last data filed at 10/22/2019 0416 Gross per 24 hour  Intake 2596 ml  Output 400 ml  Net 2196 ml   Last 3 Weights 10/11/2019 10/10/2019 10/09/2019  Weight (lbs) 212 lb 4.9 oz 212 lb 15.4 oz 211 lb 10.3 oz  Weight (kg) 96.3 kg 96.6 kg 96 kg  Some encounter information is confidential and  restricted. Go to Review Flowsheets activity to see all data.       Telemetry   NSR 70s    Physical Exam   General:  Wake and alert but still not following commands.  HEENT: normal Neck: supple. + TC. No JVD. Carotids 2+ bilat; no bruits. No lymphadenopathy or thryomegaly appreciated.  Cor: PMI nondisplaced. Regular rate & rhythm. No rubs, gallops or murmurs. Lungs: clear   Abdomen: soft, nontender, nondistended. No hepatosplenomegaly. No bruits or masses. Good bowel sounds. Extremities: no cyanosis, clubbing, rash, edema,  Neuro: awake and alert but not following commands   Labs    High Sensitivity Troponin:   Recent Labs  Lab 09/23/19 1539 09/23/19 1816 09/24/19 0039 09/24/19 0826  TROPONINIHS 127* 2,078* 24,421* >27,000*      Chemistry Recent Labs  Lab 10/20/19 0500 10/21/19 1113 10/22/19 0423  NA 139 136 138  K 4.1 4.0 3.9  CL 104 103 103  CO2 _1 GLUCOSE 140* 123* 125*  BUN 21* 16 15  CREATININE 0.48 0.46 0.52  CALCIUM 9.8 9.7 10.0  GFRNONAA >60 >60 >60  GFRAA >60 >60 >60  ANIONGAP 9 8 9  Hematology Recent Labs  Lab 10/19/19 0500 10/20/19 0500 10/21/19 1113  WBC 8.4 7.8 8.7  RBC 4.16 4.07 3.92  HGB 10.4* 10.2* 10.2*  HCT 34.6* 33.9* 33.1*  MCV 83.2 83.3 84.4  MCH 25.0* 25.1* 26.0  MCHC 30.1 30.1 30.8  RDW 17.2* 17.0* 17.2*  PLT 694* 641* 568*    BNP No results for input(s): BNP, PROBNP in the last 168 hours.   DDimer No results for input(s): DDIMER in the last 168 hours.   Radiology    No results found.  Cardiac Studies   See section above (reviewed)  Patient Profile     34 y.o. female 2 weeks post-partum with VF/torsades arrest in the setting of anterior MI  Assessment & Plan    1. VF arrest (in-hospital): in setting of anterior infarct. - Rhythm stable unfortunately with severe anoxic brain injury. (see below) - EEG 8/20 diffuse slowing but improved - Brain MRI normal x 2. EEG still with diffuse slowing -  Remains on vent through trach.  - VT quiescent. Keep K> 4.0 Mg > 2.0  2. Acute systolic heart failure: secondary to anterior MI, 09/24/19 LVEF 35%.  -Volume status appears stable but will ask RN to check daily wts (not checked since 9/4) - On carvedilol 6.25 bid - Continue Entresto 24-26 mg bid dose cut back due to soft BP.  - Continue dig 0.125. check dig level in AM  - Continue spiro 25 mg daily.  3. CAD/Anterior MI: angio reviewed again. Appears to have aneurysmal proximal LAD with dissection, likely culprit. Had TIMI-3 flow at cath but large infarct.  - Continue ASA,  beta-blocker and statin.  - Continue medical therapy for now 4. Encephalopathy, post-arrest. Likely anoxic brain injury  - MRI 09/29/19 & 10/07/19 negative.  - EEG 8/31 EEGwith diffuse slowing 5. Hypokalemia/hypomag - K 4.0 today. Check Mg in Am  6. Hypertension - stable today  7. Fevers -Remains afebrile.  - Abx per TRH 8. SVT on 10/03/19 - Resolved. Continue current dose of carvedilol.      Signed, Lyda Jester, PA-C  10/22/2019, 8:27 AM    Patient seen and examined with the above-signed Advanced Practice Provider and/or Housestaff. I personally reviewed laboratory data, imaging studies and relevant notes. I independently examined the patient and formulated the important aspects of the plan. I have edited the note to reflect any of my changes or salient points. I have personally discussed the plan with the patient and/or family.  Remains awake and somewhat interactive but not following commands. HF stable. Plan to downsize trach today.   General:  Awake and smiling at times Not following commands. HEENT: normal Neck: supple. +trach no JVD. Carotids 2+ bilat; no bruits. No lymphadenopathy or thryomegaly appreciated. Cor: PMI nondisplaced. Regular rate & rhythm. No rubs, gallops or murmurs. Lungs: clear Abdomen: soft, nontender, nondistended. No hepatosplenomegaly. No bruits or masses. Good bowel  sounds. Extremities: no cyanosis, clubbing, rash, edema Neuro: Awake and smiling at times Not following commands.  HF and rhythm stable. Slow neuro progress. We will follow at a distance.   Glori Bickers, MD  12:32 PM

## 2019-10-22 NOTE — Progress Notes (Signed)
  Speech Language Pathology Treatment: Beverly [name] Speaking valve  Patient Details Name: Beverly [name] MRN: [id number] DOB: [birth year] Today's Date: 10/22/2019 Time: [id number] SLP Time Calculation (min) (ACUTE ONLY): 26 min  Assessment / Plan / Recommendation Clinical Impression  Pt was seen for treatment with her cousin present. [name] trach was downsized to #4 cuffless today. Pt was very restless in the bed and required repositioning multiple times during the session. Pt tolerated finger occlusion without evidence of air trapping and some vocalization was noted. Vitals remained stable throughout the session and she tolerated PMSV placement for 20 minutes without any acute distress. Pt was able to clear her throat, vocalize and produce limited verbal output. Pt produced "Glynis Smiles" when asked her name and responded "yes" to some questions. Vocal intensity was reduced and vocal quality was breathy. Phonation was noted when she was cued for production of automatic sequences such as counting. However, the extent of her participation in this task is questioned since vocal intensity significantly reduced speech intelligibility. Pt is now able to access her upper airway and tolerate PMSV since trach has been downsized. SLP will continue to follow pt.    HPI HPI: Pt is a 34 y.o. female with PMH of HTN who was admitted 09/23/19 2 weeks post partum after emergent C section due to rupture of membranes with VF arrest. Pt with enterobacter PNA and encephalopathy, trach placed 8/30 and PEG on 8/31. MRI 8/31: No evidence of anoxic brain injury. Pt changed to #4 cuffless trach on 9/15      SLP Plan  Continue with current plan of care       Recommendations         Patient may use Passy-Muir Speech Valve: with SLP only PMSV Supervision: Full         Oral Care Recommendations: Oral care QID Follow up Recommendations: LTACH SLP Visit Diagnosis: Aphonia (R49.1) Plan: Continue with current plan of care        Khristian Seals I. Vear Clock, MS, CCC-SLP Acute Rehabilitation Services Office number [phone] Pager [phone]                [name] 10/22/2019, 5:54 PM

## 2019-10-22 NOTE — Progress Notes (Signed)
PROGRESS NOTE    Beverly [name]  UJW:[id number] DOB: [birth year] DOA: 09/24/2019 PCP: Patient, No Pcp Per     Brief Narrative:  34 year old female presents to the hospital 2 weeks postpartum with chest pain and in the ED sustained V. fib arrest with V. fib x3 epi x1, EKG showed ST elevation underwent Cath Lab at [facility] showed aneurysmal dilation(dissection?) of the LAD was identified. She was transferred to [facility] for ICU admission.LVEF 30% EF in cath lab.  Echo 8/18>LVEF 30-35%, septal and apical akinesis distal anterior wall an d mid/distal inferior wall hypokinesis hyperdynamic basal function. LV size mildly dilated.  Patient suffered anoxic brain injury. 8.23:MRI brain  8/30 underwent tracheostomy 8/30 PEG tube placement 9/10: Continues to be encephalopathic, able to open eyes and track but not following any commands and not talking,is moving extremities independently 9/15: Tracheostomy downsized to # 4 cufless.  Subjective: No acute events overnight.  Remains confused does not follow command nonverbal, slowly moving her arms and legs. Remains on trach collar.  Afebrile.  Assessment & Plan:  V FIb arrest (in hospital) in the setting of NSTEMI/anterior MI: No change in meds, continue on intermittent left leg monitoring, Coreg and monitoring telemetry.    CAD/anterior MI status post cardiac cath appears to have aneurysmal proximal LAD with dissection likely culprit.  Continue medical therapy w/ aspirin 81 Lipitor  Coreg, entresto.  Acute systolic congestive heart failure with EF 35%: 2/2 MI.  Compensated.  Entresto cut down 24-26 after discussing with  Cards 9/12 due to soft bp.  BP remains stable.  Continue on Coreg, Aldactone, digoxin   Acute anoxic encephalopathy/acute metabolic encephalopathy 2/2 V fib cardiac arrest: EEG 8/20 diffuse slowing but improved,  brain MRI normal x2.  Slow neuro progress alert awake but not following commands.  Slowly moving her  extremities.  Remains confused.  She is trach and PEG dependent.   Hypokalemia/hypomagnesemia: Resolved  Hypertension: Blood pressure stable after cutting down Entresto on 9/13.  Continue current medication with Entresto 24-26,coreg Aldactone/ Appreciate cardiology input.    SVT in 8/27 - NSR. continue Coreg  Low-grade fever: Resolved.  Imaging question aspiration pneumonia, was on cefepime completed on 9/9.   Respiratory failure with trach collar in place.  Continue pulmonary hygiene, continue respiratory support speech following closely,downsized to # 4 cuffless 9/15 by PCCM  Thrombocytosis: Monitor, plt peaked 722k> 572k > 648> 568. monitor intermittently..  Diarrhea: Off Flexi-Seal.  Hold laxatives.  Continue Imodium as needed she is on tube feeding likely explaining her diarrhea.    DVT prophylaxis: enoxaparin (LOVENOX) injection 40 mg Start: 10/07/19 1000 Place and maintain sequential compression device Start: 09/24/19 0136 Code Status:   Code Status: Full Code  Family Communication: plan of care discussed with patient's mother at bedside multiple occasions  Status is: Inpatient Remains inpatient appropriate because:IV treatments appropriate due to intensity of illness or inability to take PO and Inpatient level of care appropriate due to severity of illness  Dispo: The patient is from: Home              Anticipated d/c is to: LTAC vs SNF- PER CARE TEAM wiil need trach in place for at least 30 days prior to going to SNF.              Anticipated d/c date is: Once bed available              Patient currently medically stable for SNF  Nutrition: Diet Order  None      Nutrition Problem: Increased nutrient needs Etiology: post-op healing Signs/Symptoms: estimated needs Interventions: Tube feeding Body mass index is 30.46 kg/m.  Consultants:see note  Procedures:see note Microbiology:see note Blood Culture    Component Value Date/Time   SDES URINE, RANDOM 10/08/2019  1316   SPECREQUEST NONE 10/08/2019 1316   CULT  10/08/2019 1316    NO GROWTH Performed at [facility] [address] [place], [place] [address]    REPTSTATUS 10/09/2019 FINAL 10/08/2019 1316    Other culture-see note  Medications: Scheduled Meds: . aspirin  81 mg Per Tube Daily  . atorvastatin  80 mg Per Tube Daily  . carvedilol  6.25 mg Per Tube BID WC  . chlorhexidine gluconate (MEDLINE KIT)  15 mL Mouth Rinse BID  . Chlorhexidine Gluconate Cloth  6 each Topical Daily  . digoxin  0.125 mg Per Tube Daily  . enoxaparin (LOVENOX) injection  40 mg Subcutaneous Q24H  . feeding supplement (PROSource TF)  45 mL Per Tube QID  . insulin aspart  0-15 Units Subcutaneous Q4H  . mouth rinse  15 mL Mouth Rinse 10 times per day  . prenatal multivitamin  1 tablet Per Tube Q1200  . sacubitril-valsartan  1 tablet Per Tube BID  . scopolamine  1 patch Transdermal Q72H  . sodium chloride flush  10-40 mL Intracatheter Q12H  . spironolactone  25 mg Per Tube Daily   Continuous Infusions: . sodium chloride 10 mL/hr at 10/16/19 1600  . feeding supplement (OSMOLITE 1.5 CAL) 1,000 mL (10/21/19 1716)    Antimicrobials: Anti-infectives (From admission, onward)   Start     Dose/Rate Route Frequency Ordered Stop   10/09/19 2100  vancomycin (VANCOREADY) IVPB 1500 mg/300 mL  Status:  Discontinued        1,500 mg 150 mL/hr over 120 Minutes Intravenous Every 12 hours 10/09/19 1237 10/10/19 1121   10/09/19 0800  ceFEPIme (MAXIPIME) 2 g in sodium chloride 0.9 % 100 mL IVPB        2 g 200 mL/hr over 30 Minutes Intravenous Every 8 hours 10/09/19 0752 10/15/19 2147   10/09/19 0800  vancomycin (VANCOREADY) IVPB 2000 mg/400 mL        2,000 mg 200 mL/hr over 120 Minutes Intravenous  Once 10/09/19 0752 10/09/19 1147   10/07/19 1540  ceFAZolin (ANCEF) 2-4 GM/100ML-% IVPB       Note to Pharmacy: [name] Manges   : cabinet override      10/07/19 1540 10/08/19 0344   10/07/19 1400  ceFAZolin (ANCEF) IVPB  2g/100 mL premix        2 g 200 mL/hr over 30 Minutes Intravenous To Radiology 10/06/19 1339 10/07/19 1627   09/30/19 0300  vancomycin (VANCOCIN) IVPB 1000 mg/200 mL premix  Status:  Discontinued        1,000 mg 200 mL/hr over 60 Minutes Intravenous Every 8 hours 09/29/19 1809 10/01/19 0853   09/29/19 1815  ceFEPIme (MAXIPIME) 2 g in sodium chloride 0.9 % 100 mL IVPB        2 g 200 mL/hr over 30 Minutes Intravenous Every 8 hours 09/29/19 1803 10/05/19 1801   09/29/19 1815  vancomycin (VANCOREADY) IVPB 2000 mg/400 mL        2,000 mg 200 mL/hr over 120 Minutes Intravenous  Once 09/29/19 1803 09/29/19 2137   09/24/19 2200  cefTRIAXone (ROCEPHIN) 2 g in sodium chloride 0.9 % 100 mL IVPB        2 g  200 mL/hr over 30 Minutes Intravenous Daily at bedtime 09/24/19 1201 09/28/19 2338   09/24/19 0330  cefTRIAXone (ROCEPHIN) 1 g in sodium chloride 0.9 % 100 mL IVPB  Status:  Discontinued        1 g 200 mL/hr over 30 Minutes Intravenous Daily at bedtime 09/24/19 0245 09/24/19 1201   09/24/19 0300  azithromycin (ZITHROMAX) 500 mg in sodium chloride 0.9 % 250 mL IVPB  Status:  Discontinued        500 mg 250 mL/hr over 60 Minutes Intravenous Daily at bedtime 09/24/19 0245 09/24/19 1103     Objective: Vitals: Today's Vitals   10/22/19 0811 10/22/19 1057 10/22/19 1123 10/22/19 1145  BP:  109/74  129/84  Pulse: 100 83 (!) 111   Resp: (!) '21 18 20 ' (!) 25  Temp:  98.3 F (36.8 C)  98.5 F (36.9 C)  TempSrc:  Oral  Oral  SpO2: 96% 97% 99% 100%  Weight:      Height:      PainSc:        Intake/Output Summary (Last 24 hours) at 10/22/2019 1424 Last data filed at 10/22/2019 0416 Gross per 24 hour  Intake 2596 ml  Output 400 ml  Net 2196 ml   Filed Weights   10/09/19 0342 10/10/19 0339 10/11/19 0443  Weight: 96 kg 96.6 kg 96.3 kg   Weight change:   Intake/Output from previous day: 09/14 0701 - 09/15 0700 In: 2596 [NG/GT:2596] Out: 400 [Urine:400] Intake/Output this shift: No  intake/output data recorded.  Examination: General exam: AA confused nonverbal does not follow commands, NAD, weak appearing. HEENT:Oral mucosa moist, Ear/Nose WNL grossly, dentition normal. Respiratory system: bilaterally clear, trach collar in place,no wheezing or crackles,no use of accessory muscle Cardiovascular system: S1 & S2 +, No JVD,. Gastrointestinal system: Abdomen soft, NT,ND, BS+.  PEG tube present Nervous System:Alert, awake, moving extremities and grossly nonfocal Extremities: No edema, distal peripheral pulses palpable.  Skin: No rashes,no icterus. MSK: Normal muscle bulk,tone, power  Data Reviewed: I have personally reviewed following labs and imaging studies CBC: Recent Labs  Lab 10/18/19 0513 10/19/19 0500 10/20/19 0500 10/21/19 1113  WBC 9.6 8.4 7.8 8.7  HGB 10.9* 10.4* 10.2* 10.2*  HCT 36.3 34.6* 33.9* 33.1*  MCV 83.4 83.2 83.3 84.4  PLT 713* 694* 641* 122*   Basic Metabolic Panel: Recent Labs  Lab 10/18/19 0513 10/19/19 0500 10/20/19 0500 10/21/19 1113 10/22/19 0423  NA 136 137 139 136 138  K 4.4 4.3 4.1 4.0 3.9  CL 102 102 104 103 103  CO2 '25 25 26 25 26  ' GLUCOSE 147* 116* 140* 123* 125*  BUN 24* 25* 21* 16 15  CREATININE 0.48 0.48 0.48 0.46 0.52  CALCIUM 10.0 9.8 9.8 9.7 10.0   GFR: Estimated Creatinine Clearance: 126.9 mL/min (by C-G formula based on SCr of 0.52 mg/dL). Liver Function Tests: No results for input(s): AST, ALT, ALKPHOS, BILITOT, PROT, ALBUMIN in the last 168 hours. No results for input(s): LIPASE, AMYLASE in the last 168 hours. No results for input(s): AMMONIA in the last 168 hours. Coagulation Profile: No results for input(s): INR, PROTIME in the last 168 hours. Cardiac Enzymes: No results for input(s): CKTOTAL, CKMB, CKMBINDEX, TROPONINI in the last 168 hours. BNP (last 3 results) No results for input(s): PROBNP in the last 8760 hours. HbA1C: No results for input(s): HGBA1C in the last 72 hours. CBG: Recent Labs    Lab 10/21/19 1927 10/21/19 2356 10/22/19 0422 10/22/19 0807 10/22/19 1146  GLUCAP  123* 120* 124* 115* 102*   Lipid Profile: No results for input(s): CHOL, HDL, LDLCALC, TRIG, CHOLHDL, LDLDIRECT in the last 72 hours. Thyroid Function Tests: No results for input(s): TSH, T4TOTAL, FREET4, T3FREE, THYROIDAB in the last 72 hours. Anemia Panel: No results for input(s): VITAMINB12, FOLATE, FERRITIN, TIBC, IRON, RETICCTPCT in the last 72 hours. Sepsis Labs: No results for input(s): PROCALCITON, LATICACIDVEN in the last 168 hours.  No results found for this or any previous visit (from the past 240 hour(s)).   Radiology Studies: No results found.   LOS: 28 days   Antonieta Pert, MD [facility]  10/22/2019, 2:24 PM

## 2019-10-22 NOTE — Progress Notes (Signed)
NAME:  Beverly [name], MRN:  [id number], DOB:  08/31/1985, LOS: 28 ADMISSION DATE:  09/24/2019, CONSULTATION DATE:  8/18 REFERRING MD:  Dr. Eldridge Dace, CHIEF COMPLAINT:  VF arrest   Brief History   34 year old female 2 weeks post partum presented with chest pain and suffered VF arrest.    History of present illness   Patient is encephalopathic and/or intubated. Therefore history has been obtained from chart review.  34 year old female with PMH as below, which is significant for HTN and 2 weeks post-partum. She had emergency C-section delivery due to premature rupture of membranes. She did have some gestational hypertension, but her pregnancy was otherwise uncomplicated. On 8/17 she developed dull chest pain shortly after eating, which prompted ED presentation at [facility]. Resolved spontaneously. No associated symptoms.   Past Medical History   has a past medical history of Hypertension and Vaginal Pap smear, abnormal.  Significant Hospital Events   8/17 chest pain, VF arrest; While in ED, she suffered VT > VF arrest with ROSC after defib x 3 and epi x 1. Immediately after arrest ST elevation was noted on EKG, which did improve on repeat. Differential ACS, SCAD, and toresades/metabolic. Electrolytes were replaced. She was taken to the cath lab at [facility]  Where aneurysmal dilation of the ostial and proximal LAD and was transferred to [facility] for ICU admission.  8/23 possibly febrile. Pan-cultures sent. Still on full support. Gets hypertensive when sedation let up. abx started later in day when temp >101 8/24 opens eyes to stimulus. + strong cough. Tolerating PSV but mental status still not supporting extubation  8/25 about the same  8/27: Seems to be moving a little bit more but mostly posturing.  Opening eyes more.  Had a run of SVT. 8/30 perc trach by PCCM  8/31 PEG with IR 9/2 started on vanc/cefepime for VAP coverage 9/4 transferred out of ICU  Consults:  PCCM   Procedures:    8/18> Central Line, removed 8/26 PICC line placed 8/30 trach 8/31 PEG  Significant Diagnostic Tests:  [facility] 8/17 > No coronary artery occlusion or critical stenosis. There is aneurysmal dilation of the ostial and proximal LAD with possible ulceration or dissection of uncertain chronicity.  TIMI-3 flow is noted throughout the LAD and its branches. Severely reduced left ventricular systolic function with mid/apical anterior, apical, and apical inferior hypokinesis/akinesis. LV gram 30% EF.  CT head 8/17 > normal  CTA chest 8/17 > no evidence of PE, moderate severity infiltrates vs ATX, small bilateral pleural effusions. Very small pericaradial effusion.  CT abdomen/pelvis 8/17 > Enlarged, heterogeneous uterus, Small amount of fluid seen in between the right kidney and right lobe of the liver. Echo 8/18> LVEF 30-35%, septal and apical akinesis distal anterior wall an d mid/distal inferior wall hypokinesis hyperdynamic basal function. LV size mildly dilated.  MRI brain 8/23: Normal  Micro Data:  See micro tab Blood 9/1 >> ngtd Sputum 9/1 >staph aureus, enterobacter aerogenes   Antimicrobials:   Vanc 9/2>> Cefepime 9/2>>  Interim history/subjective:   NAEO Continues to have improved secretions   Objective   Blood pressure 102/66, pulse 100, temperature 98.7 F (37.1 C), temperature source Oral, resp. rate (!) 21, height 5\' 10"  (1.778 m), weight 96.3 kg, SpO2 96 %, unknown if currently breastfeeding.    FiO2 (%):  [21 %] 21 %   Intake/Output Summary (Last 24 hours) at 10/22/2019 0915 Last data filed at 10/22/2019 0416 Gross per 24 hour  Intake 2596 ml  Output 400 ml  Net 2196 ml   Filed Weights   10/09/19 0342 10/10/19 0339 10/11/19 0443  Weight: 96 kg 96.6 kg 96.3 kg    Examination: GEN: WDWN adult F, supine in bed NAD  HEENT: #6 cuffed trach secure. Trachea midline. Anicteric sclera  CV: RRR s1s2 no rgm cap refill brisk  PULM: Even, unlabored respirations. Scant secretions.  CTA bilaterally  GI: Soft round ndnt  EXT: Symmetrical bulk and tone no clubbing or cyanosis.  NEURO: Awake, alert. Does not follow commands. Moving BUE BLE spontaneously  SKIN: c/d/w without rash   Resolved Hospital Problem list    Assessment & Plan:   Acute Hypoxemic Respiratory Failure 2/2 to Cardiac Arrest, tracheostomy status  -resp failure c/bPulmonary Edema, Enterobacter and MSSA pneumonia (S) to cefepime   - s/p tracheostomy 8/30, transferred out of ICU on ATC 9/4 P -Order for downsize to #4 cuffless placed 9/14 late in day, plan to complete today 9/15  -continues to tolerate ATC well   -cont pulm hygiene  -cont routine trach care    PCCM will continue to follow for trach   Best practice:  Diet: TF Pain/Anxiety/Delirium protocol (if indicated): not needed VAP protocol (if indicated): yes DVT prophylaxis: lovenox GI prophylaxis: n/a Glucose control: SSI Mobility:PT/OT Code Status: FULL  Family Communication:  Per primary  Disposition: progressive

## 2019-10-23 LAB — MAGNESIUM: Magnesium: 1.9 mg/dL (ref 1.7–2.4)

## 2019-10-23 LAB — BASIC METABOLIC PANEL
Anion gap: 12 (ref 5–15)
BUN: 14 mg/dL (ref 6–20)
CO2: 24 mmol/L (ref 22–32)
Calcium: 10 mg/dL (ref 8.9–10.3)
Chloride: 102 mmol/L (ref 98–111)
Creatinine, Ser: 0.46 mg/dL (ref 0.44–1.00)
GFR calc Af Amer: >60 mL/min (ref >60)
GFR calc non Af Amer: >60 mL/min (ref >60)
Glucose, Bld: 115 mg/dL — ABNORMAL HIGH (ref 70–99)
Potassium: 4.1 mmol/L (ref 3.5–5.1)
Sodium: 138 mmol/L (ref 135–145)

## 2019-10-23 LAB — GLUCOSE, CAPILLARY
Glucose-Capillary: 123 mg/dL — ABNORMAL HIGH (ref 70–99)
Glucose-Capillary: 121 mg/dL — ABNORMAL HIGH (ref 70–99)
Glucose-Capillary: 107 mg/dL — ABNORMAL HIGH (ref 70–99)
Glucose-Capillary: 95 mg/dL (ref 70–99)
Glucose-Capillary: 99 mg/dL (ref 70–99)
Glucose-Capillary: 114 mg/dL — ABNORMAL HIGH (ref 70–99)

## 2019-10-23 LAB — DIGOXIN LEVEL: Digoxin Level: 0.2 ng/mL — ABNORMAL LOW (ref 0.8–2.0)

## 2019-10-23 NOTE — Progress Notes (Signed)
PROGRESS NOTE    Beverly [name]  JKD:[id number] DOB: April 28, 1985 DOA: 09/24/2019 PCP: Patient, No Pcp Per     Brief Narrative:  34 year old female presents to the hospital 2 weeks postpartum with chest pain and in the ED sustained V. fib arrest with V. fib x3 epi x1, EKG showed ST elevation underwent Cath Lab at [facility] showed aneurysmal dilation(dissection?) of the LAD was identified. She was transferred to [facility] for ICU admission.LVEF 30% EF in cath lab.  Echo 8/18>LVEF 30-35%, septal and apical akinesis distal anterior wall an d mid/distal inferior wall hypokinesis hyperdynamic basal function. LV size mildly dilated.  Patient suffered anoxic brain injury. 8.23:MRI brain  8/30 underwent tracheostomy 8/30 PEG tube placement 9/10: Continues to be encephalopathic, able to open eyes and track but not following any commands and not talking,is moving extremities independently 9/15: Tracheostomy downsized to # 4 cufless.  Subjective: Seen this morning mother at the bedside.  Patient is alert awake not following commands. Reportedly patient was able to tell her name to the speech therapist yesterday.  Assessment & Plan:  V FIb arrest (in hospital) in the setting of NSTEMI/anterior MI: No change in meds, continue on intermittent left leg monitoring, Coreg and monitoring telemetry.  Overall stable.  Potassium 4.1 renal function is stable mag 1.9.  CAD/anterior MI status post cardiac cath appears to have aneurysmal proximal LAD with dissection likely culprit.  Continue current medical therapy with aspirin 81 Lipitor  Coreg, entresto.  Acute systolic congestive heart failure with EF 35%: 2/2 MI.  Euvolemic.  Entresto dose was cut down 24-26 after discussing with  Cards 9/12 due to soft bp.  BP remains stable.Continue on Coreg, Aldactone, digoxin.  Monitor renal function intermittently.  Acute anoxic encephalopathy/acute metabolic encephalopathy 2/2 V fib cardiac arrest: EEG 8/20  diffuse slowing but improved,  brain MRI normal x2. Slowly moving her extremities.  Remains confused.  She is trach and PEG dependent.  Seems to be slowly improving.  Hypokalemia/hypomagnesemia: Stable  Hypertension: Blood pressure stable after cutting down Entresto on 9/13.  Continue current medication with Entresto 24-26,coreg Aldactone/ Appreciate cardiology input.    SVT in 8/27 - NSR. continue Coreg  Low-grade fever: Resolved.  Imaging question aspiration pneumonia, was on cefepime completed on 9/9.   Respiratory failure with trach collar in place.  Continue pulmonary hygiene, continue respiratory support speech following closely,downsized to # 4 cuffless 9/15 by PCCM  Thrombocytosis: Monitor, plt peaked 722k> 572k > 648> 568. monitor intermittently..  Diarrhea: Off Flexi-Seal.  Hold laxatives.  Continue Imodium as needed she is on tube feeding likely explaining her diarrhea.    DVT prophylaxis: enoxaparin (LOVENOX) injection 40 mg Start: 10/07/19 1000 Place and maintain sequential compression device Start: 09/24/19 0136 Code Status:   Code Status: Full Code  Family Communication: plan of care discussed with patient's mother at bedside multiple occasions and also today  Status is: Inpatient Remains inpatient appropriate because:IV treatments appropriate due to intensity of illness or inability to take PO and Inpatient level of care appropriate due to severity of illness  Dispo: The patient is from: Home              Anticipated d/c is to: LTAC vs SNF- PER CARE TEAM wiil need trach in place for at least 30 days prior to going to SNF.              Anticipated d/c date is: Once bed available  Patient currently medically stable for SNF  Nutrition: Diet Order    None      Nutrition Problem: Increased nutrient needs Etiology: post-op healing Signs/Symptoms: estimated needs Interventions: Tube feeding Body mass index is 33.15 kg/m.  Consultants:see note    Procedures:see note Microbiology:see note Blood Culture    Component Value Date/Time   SDES URINE, RANDOM 10/08/2019 1316   SPECREQUEST NONE 10/08/2019 1316   CULT  10/08/2019 1316    NO GROWTH Performed at [facility] [address], [place], [place] [address]    REPTSTATUS 10/09/2019 FINAL 10/08/2019 1316    Other culture-see note  Medications: Scheduled Meds: . aspirin  81 mg Per Tube Daily  . atorvastatin  80 mg Per Tube Daily  . carvedilol  6.25 mg Per Tube BID WC  . chlorhexidine gluconate (MEDLINE KIT)  15 mL Mouth Rinse BID  . Chlorhexidine Gluconate Cloth  6 each Topical Daily  . digoxin  0.125 mg Per Tube Daily  . enoxaparin (LOVENOX) injection  40 mg Subcutaneous Q24H  . feeding supplement (PROSource TF)  45 mL Per Tube QID  . insulin aspart  0-15 Units Subcutaneous Q4H  . mouth rinse  15 mL Mouth Rinse 10 times per day  . prenatal multivitamin  1 tablet Per Tube Q1200  . sacubitril-valsartan  1 tablet Per Tube BID  . scopolamine  1 patch Transdermal Q72H  . sodium chloride flush  10-40 mL Intracatheter Q12H  . spironolactone  25 mg Per Tube Daily   Continuous Infusions: . sodium chloride 10 mL/hr at 10/16/19 1600  . feeding supplement (OSMOLITE 1.5 CAL) 1,000 mL (10/21/19 1716)    Antimicrobials: Anti-infectives (From admission, onward)   Start     Dose/Rate Route Frequency Ordered Stop   10/09/19 2100  vancomycin (VANCOREADY) IVPB 1500 mg/300 mL  Status:  Discontinued        1,500 mg 150 mL/hr over 120 Minutes Intravenous Every 12 hours 10/09/19 1237 10/10/19 1121   10/09/19 0800  ceFEPIme (MAXIPIME) 2 g in sodium chloride 0.9 % 100 mL IVPB        2 g 200 mL/hr over 30 Minutes Intravenous Every 8 hours 10/09/19 0752 10/15/19 2147   10/09/19 0800  vancomycin (VANCOREADY) IVPB 2000 mg/400 mL        2,000 mg 200 mL/hr over 120 Minutes Intravenous  Once 10/09/19 0752 10/09/19 1147   10/07/19 1540  ceFAZolin (ANCEF) 2-4 GM/100ML-% IVPB       Note  to Pharmacy: [name] Manges   : cabinet override      10/07/19 1540 10/08/19 0344   10/07/19 1400  ceFAZolin (ANCEF) IVPB 2g/100 mL premix        2 g 200 mL/hr over 30 Minutes Intravenous To Radiology 10/06/19 1339 10/07/19 1627   09/30/19 0300  vancomycin (VANCOCIN) IVPB 1000 mg/200 mL premix  Status:  Discontinued        1,000 mg 200 mL/hr over 60 Minutes Intravenous Every 8 hours 09/29/19 1809 10/01/19 0853   09/29/19 1815  ceFEPIme (MAXIPIME) 2 g in sodium chloride 0.9 % 100 mL IVPB        2 g 200 mL/hr over 30 Minutes Intravenous Every 8 hours 09/29/19 1803 10/05/19 1801   09/29/19 1815  vancomycin (VANCOREADY) IVPB 2000 mg/400 mL        2,000 mg 200 mL/hr over 120 Minutes Intravenous  Once 09/29/19 1803 09/29/19 2137   09/24/19 2200  cefTRIAXone (ROCEPHIN) 2 g in sodium chloride  0.9 % 100 mL IVPB        2 g 200 mL/hr over 30 Minutes Intravenous Daily at bedtime 09/24/19 1201 09/28/19 2338   09/24/19 0330  cefTRIAXone (ROCEPHIN) 1 g in sodium chloride 0.9 % 100 mL IVPB  Status:  Discontinued        1 g 200 mL/hr over 30 Minutes Intravenous Daily at bedtime 09/24/19 0245 09/24/19 1201   09/24/19 0300  azithromycin (ZITHROMAX) 500 mg in sodium chloride 0.9 % 250 mL IVPB  Status:  Discontinued        500 mg 250 mL/hr over 60 Minutes Intravenous Daily at bedtime 09/24/19 0245 09/24/19 1103     Objective: Vitals: Today's Vitals   10/23/19 0828 10/23/19 0842 10/23/19 1115 10/23/19 1132  BP: 116/85 116/85 94/70 94/70   Pulse: 81 92 96 (!) 103  Resp: 18 19 (!) 21 17  Temp: 98.5 F (36.9 C)  98.2 F (36.8 C)   TempSrc: Axillary  Oral   SpO2: 99% 100% 100% 100%  Weight:      Height:      PainSc:        Intake/Output Summary (Last 24 hours) at 10/23/2019 1346 Last data filed at 10/22/2019 1730 Gross per 24 hour  Intake --  Output 3 ml  Net -3 ml   Filed Weights   10/10/19 0339 10/11/19 0443 10/23/19 0427  Weight: 96.6 kg 96.3 kg 104.8 kg   Weight change:   Intake/Output  from previous day: 09/15 0701 - 09/16 0700 In: -  Out: 3 [Stool:3] Intake/Output this shift: No intake/output data recorded.  Examination: General exam: AA, not following commands, nonverbal, NAD, weak appearing. HEENT:Oral mucosa moist, Ear/Nose WNL grossly, dentition normal. Respiratory system: bilaterally clear trach collar in place,no wheezing or crackles,no use of accessory muscle. Cardiovascular system: S1 & S2 +, No JVD. Gastrointestinal system: Abdomen soft, NT,ND, BS+.PEG+. Nervous System:Alert, awake, moving extremities slowly spontaneously on her own Extremities: No edema, distal peripheral pulses palpable.  Skin: No rashes,no icterus. MSK: Normal muscle bulk,tone, power.  Data Reviewed: I have personally reviewed following labs and imaging studies CBC: Recent Labs  Lab 10/18/19 0513 10/19/19 0500 10/20/19 0500 10/21/19 1113  WBC 9.6 8.4 7.8 8.7  HGB 10.9* 10.4* 10.2* 10.2*  HCT 36.3 34.6* 33.9* 33.1*  MCV 83.4 83.2 83.3 84.4  PLT 713* 694* 641* 976*   Basic Metabolic Panel: Recent Labs  Lab 10/19/19 0500 10/20/19 0500 10/21/19 1113 10/22/19 0423 10/23/19 0516  NA 137 139 136 138 138  K 4.3 4.1 4.0 3.9 4.1  CL 102 104 103 103 102  CO2 25 26 25 26 24   GLUCOSE 116* 140* 123* 125* 115*  BUN 25* 21* 16 15 14   CREATININE 0.48 0.48 0.46 0.52 0.46  CALCIUM 9.8 9.8 9.7 10.0 10.0  MG  --   --   --   --  1.9   GFR: Estimated Creatinine Clearance: 132.3 mL/min (by C-G formula based on SCr of 0.46 mg/dL). Liver Function Tests: No results for input(s): AST, ALT, ALKPHOS, BILITOT, PROT, ALBUMIN in the last 168 hours. No results for input(s): LIPASE, AMYLASE in the last 168 hours. No results for input(s): AMMONIA in the last 168 hours. Coagulation Profile: No results for input(s): INR, PROTIME in the last 168 hours. Cardiac Enzymes: No results for input(s): CKTOTAL, CKMB, CKMBINDEX, TROPONINI in the last 168 hours. BNP (last 3 results) No results for input(s):  PROBNP in the last 8760 hours. HbA1C: No results for input(s): HGBA1C  in the last 72 hours. CBG: Recent Labs  Lab 10/22/19 2037 10/22/19 2339 10/23/19 0429 10/23/19 0837 10/23/19 1126  GLUCAP 101* 103* 123* 121* 107*   Lipid Profile: No results for input(s): CHOL, HDL, LDLCALC, TRIG, CHOLHDL, LDLDIRECT in the last 72 hours. Thyroid Function Tests: No results for input(s): TSH, T4TOTAL, FREET4, T3FREE, THYROIDAB in the last 72 hours. Anemia Panel: No results for input(s): VITAMINB12, FOLATE, FERRITIN, TIBC, IRON, RETICCTPCT in the last 72 hours. Sepsis Labs: No results for input(s): PROCALCITON, LATICACIDVEN in the last 168 hours.  No results found for this or any previous visit (from the past 240 hour(s)).   Radiology Studies: No results found.   LOS: 13 days   Antonieta Pert, MD [facility]  10/23/2019, 1:46 PM

## 2019-10-24 DIAGNOSIS — R1310 Dysphagia, unspecified: Secondary | ICD-10-CM

## 2019-10-24 DIAGNOSIS — J9611 Chronic respiratory failure with hypoxia: Secondary | ICD-10-CM

## 2019-10-24 LAB — GLUCOSE, CAPILLARY
Glucose-Capillary: 108 mg/dL — ABNORMAL HIGH (ref 70–99)
Glucose-Capillary: 113 mg/dL — ABNORMAL HIGH (ref 70–99)
Glucose-Capillary: 113 mg/dL — ABNORMAL HIGH (ref 70–99)
Glucose-Capillary: 122 mg/dL — ABNORMAL HIGH (ref 70–99)
Glucose-Capillary: 123 mg/dL — ABNORMAL HIGH (ref 70–99)
Glucose-Capillary: 130 mg/dL — ABNORMAL HIGH (ref 70–99)

## 2019-10-24 NOTE — TOC Initial Note (Signed)
Transition of Care [facility]) - Initial/Assessment Note    Patient Details  Name: Beverly [name] MRN: [id number] Date of Birth: May 01, 1985  Transition of Care [facility]) CM/SW Contact:    Beverly [name], LCSWA Phone Number: 10/24/2019, 11:54 AM  Clinical Narrative:                  CSW spoke with patient's mother,Beverly [name] via phone. CSW introduced self and explained role. CSW discussed PT recommendation for short term rehab at [facility]. Patient's mother states she is agrees with recommendation. She reports patient lives in the home with her children and her significant other. She shared the patient has plenty of family support. CSW explained the SNF process,explained possible barrier (no payor source) and the probability of  limited SNF options. Patient's mother states understanding. CSW was given permission to send out SNF referrals.   CSW sent email for financial counselor Beverly [name] to assist family with Medicaid application. CSW informed patient's mother the application will be processed by [employer] and can take up to 90 days to process.   CSW will continue to follow and assist with discharge planning.  Beverly [name], MSW, LCSWA Clinical Social Worker    Expected Discharge Plan: Skilled Nursing Facility Barriers to Discharge: Continued Medical Work up, SNF Pending Medicaid, SNF Pending bed offer   Patient Goals and CMS Choice        Expected Discharge Plan and Services Expected Discharge Plan: Skilled Nursing Facility In-house Referral: Clinical Social Work                                            Prior Living Arrangements/Services   Lives with:: Self, Significant Other, Minor Children Patient language and need for interpreter reviewed:: No        Need for Family Participation in Patient Care: Yes (Comment) Care giver support system in place?: Yes (comment)   Criminal Activity/Legal Involvement Pertinent to Current Situation/Hospitalization: No  - Comment as needed  Activities of Daily Living Home Assistive Devices/Equipment: None ADL Screening (condition at time of admission) Patient's cognitive ability adequate to safely complete daily activities?: Yes Is the patient deaf or have difficulty hearing?: No Does the patient have difficulty seeing, even when wearing glasses/contacts?: No Does the patient have difficulty concentrating, remembering, or making decisions?: No Patient able to express need for assistance with ADLs?: Yes Does the patient have difficulty dressing or bathing?: No Independently performs ADLs?: Yes (appropriate for developmental age) Does the patient have difficulty walking or climbing stairs?: No Weakness of Legs: None Weakness of Arms/Hands: None  Permission Sought/Granted Permission sought to share information with : Family Supports Permission granted to share information with : Yes, Verbal Permission Granted  Share Information with NAME: Beverly [name]  Permission granted to share info w AGENCY: SNFs  Permission granted to share info w Relationship: mother  Permission granted to share info w Contact Information: [phone]  Emotional Assessment         Alcohol / Substance Use: Not Applicable Psych Involvement: No (comment)  Admission diagnosis:  Cardiac arrest [facility]) [I46.9] Patient Active Problem List   Diagnosis Date Noted  . Respiratory failure (HCC)   . Acute metabolic encephalopathy   . Encounter for central line placement   . Cardiac arrest (HCC) 09/23/2019  . Acute combined systolic and diastolic heart failure (HCC) 09/23/2019  . Ventricular tachycardia, polymorphic (  HCC) 09/23/2019  . Pelvic pain affecting pregnancy 10/15/2015  . Supervision of normal pregnancy in third trimester 09/28/2015  . Poor weight gain of pregnancy 09/28/2015  . Iron deficiency anemia of pregnancy 09/01/2015  . Increased BMI (body mass index) 07/29/2015   PCP:  Patient, No Pcp Per Pharmacy:    [id number] - [place], [place] - [address] AT [facility] [address] [place] [address] Phone: [phone] Fax: [phone]     Social Determinants of Health (SDOH) Interventions    Readmission Risk Interventions No flowsheet data found.

## 2019-10-24 NOTE — Progress Notes (Signed)
Physical Therapy Treatment Patient Details Name: Beverly [name] MRN: [id number] DOB: 1985-10-09 Today's Date: 10/24/2019    History of Present Illness Pt is 34 yo admitted 09/23/19 2 weeks post partum after emergent C section due to rupture of membranes with VF arrest. Pt with enterobacter PNA, trach 8/30, PEG, encephalopathy. PMHx: HTN    PT Comments    Pt demonstrating gradual progress.  Improved ability to assist with transfers with initiation.  Sat EOB for 12 mins with min-mod A for balance.  Pt was able to follow 1-2 simple commands today.  Pt moves all extremities spontaneously and demonstrates good ROM.  Cont POC and advancing mobility as able.  May be able to progress to chair transfer or attempt a stand if she continues to progress and demonstrate improved ability to follow commands.   Family asking about nursing sitting pt EOB - discussed with RN and recommended they could do this with assist of 2 and turning air down on mattress.     Follow Up Recommendations  LTACH;SNF;Supervision/Assistance - 24 hour     Equipment Recommendations  Wheelchair (measurements PT);Hospital bed (hoyer lift)    Recommendations for Other Services       Precautions / Restrictions Precautions Precautions: Fall Precaution Comments: trach, peg, incontinence    Mobility  Bed Mobility Overal bed mobility: Needs Assistance Bed Mobility: Rolling;Supine to Sit;Sit to Supine Rolling: +2 for physical assistance;Max assist   Supine to sit: +2 for physical assistance;Max assist Sit to supine: +2 for physical assistance;Max assist   General bed mobility comments: Pt was able to move legs toward EOB but required max A to lift trunk.  For return to supine provided trunk support and assist for legs.  Transfers                 General transfer comment: not appropriate to attempt   Ambulation/Gait                 Stairs             Wheelchair Mobility    Modified Rankin  (Stroke Patients Only)       Balance Overall balance assessment: Needs assistance Sitting-balance support: Bilateral upper extremity supported;Feet supported Sitting balance-Leahy Scale: Zero Sitting balance - Comments: Sat EOB for 12 minutes.  Pt was min-mod A for balance (min A 25% of time).  She was able to hold head up for 3 30 sec periods.  Worked on leaning forward hands on knees and leanign to each side propped on elbows.  Mother was present and assisted.  Bed air was deflated to low level.       Standing balance comment: unable                            Cognition Arousal/Alertness: Awake/alert Behavior During Therapy: Flat affect;Restless (slight smile at times) Overall Cognitive Status: Impaired/Different from baseline                 Rancho Levels of Cognitive Functioning Rancho [email or web address] Scales of Cognitive Functioning: Generalized response               General Comments: Non verbal, followed 1-2 simple commands with multimodal cues during session, made good eye contact and tracking.      Exercises General Exercises - Lower Extremity Ankle Circles/Pumps: PROM;Both;10 reps;Supine Long Arc Quad: Both;Seated;5 reps;AAROM;Left;PROM;Right (Pt performed 3 reps L LAQ on command with demo) Heel Slides: PROM;Both;10  reps;Supine    General Comments General comments (skin integrity, edema, etc.): VSS      Pertinent Vitals/Pain Pain Assessment: No/denies pain    Home Living                      Prior Function            PT Goals (current goals can now be found in the care plan section) Acute Rehab PT Goals Patient Stated Goal: unable to state PT Goal Formulation: Patient unable to participate in goal setting Time For Goal Achievement: 11/04/19 Potential to Achieve Goals: Fair Progress towards PT goals: Progressing toward goals    Frequency    Min 2X/week      PT Plan Current plan remains appropriate    Co-evaluation               AM-PAC PT "6 Clicks" Mobility   Outcome Measure  Help needed turning from your back to your side while in a flat bed without using bedrails?: Total Help needed moving from lying on your back to sitting on the side of a flat bed without using bedrails?: Total Help needed moving to and from a bed to a chair (including a wheelchair)?: Total Help needed standing up from a chair using your arms (e.g., wheelchair or bedside chair)?: Total Help needed to walk in hospital room?: Total Help needed climbing 3-5 steps with a railing? : Total 6 Click Score: 6    End of Session Equipment Utilized During Treatment: Oxygen Activity Tolerance: Patient tolerated treatment well Patient left: in bed;with bed alarm set;with call bell/phone within reach Nurse Communication: Mobility status;Need for lift equipment (Mother requesting nursing to assist to EOB - discussed w RN recommending assist of 2 and turning down air in bed to low level) PT Visit Diagnosis: Other abnormalities of gait and mobility (R26.89);Difficulty in walking, not elsewhere classified (R26.2);Other symptoms and signs involving the nervous system (R29.898)     Time: 8841-6606 PT Time Calculation (min) (ACUTE ONLY): 25 min  Charges:  $Therapeutic Activity: 23-37 mins                     Anise Salvo, PT Acute Rehab Services Pager [phone] [facility] [phone]     Rayetta Humphrey 10/24/2019, 4:44 PM

## 2019-10-24 NOTE — Progress Notes (Signed)
PROGRESS NOTE    Beverly [name]  DXA:[id number] DOB: 1985/08/11 DOA: 09/24/2019 PCP: Patient, No Pcp Per     Brief Narrative:  Sad history of 34 year old female who presented to the hospital on 8/18, 2 weeks postpartum with chest pain and in the ED sustained V. fib arrest with V. fib x3 epi x1. EKG showed ST elevation and she underwent Cath Lab at [facility] which showed aneurysmal dilation(dissection?) of the LAD.Marland Kitchen Transferred to [facility] hospital for ICU admission.LVEF 30% EF in cath lab.  Echo 8/18>LVEF 30-35%, septal and apical akinesis distal anterior wall an d mid/distal inferior wall hypokinesis hyperdynamic basal function. LV size mildly dilated.  Patient suffered anoxic brain injury and subsequently underwent tracheostomy and PEG tube placement.  She continues to be encephalopathic, able to open eyes and track but not following any commands and not talking,is moving extremities independently.  Tracheostomy downsized on 9/15.  Current plan is for patient go to LTAC versus skilled nursing, the latter of which cannot take the patient until 30 days after tracheostomy placed, which would be 9/30. No events overnight.  Patient about the same.  Awake, not following commands.  Assessment & Plan: V FIb arrest (in hospital) in the setting of NSTEMI/anterior MI: No change in meds, continue on intermittent left leg monitoring, Coreg and monitoring telemetry.  Overall stable.  Potassium 4.1 renal function is stable mag 1.9.  CAD/anterior MI status post cardiac cath appears to have aneurysmal proximal LAD with dissection likely culprit.  Continue current medical therapy with aspirin 81 Lipitor  Coreg, entresto.  Acute systolic congestive heart failure with EF 35%: 2/2 MI.  Euvolemic.  Entresto dose was cut down 24-26 after discussing with  Cards 9/12 due to soft bp.  BP remains stable.Continue on Coreg, Aldactone, digoxin.  Monitor renal function intermittently.  Acute anoxic encephalopathy/acute  metabolic encephalopathy 2/2 V fib cardiac arrest: EEG 8/20 diffuse slowing but improved,  brain MRI normal x2. Slowly moving her extremities.  Remains confused.  She is trach and PEG dependent.  Seems to be slowly improving.  Hypokalemia/hypomagnesemia: Stable  Hypertension: Blood pressure stable after cutting down Entresto on 9/13.  Continue current medication with Entresto 24-26,coreg Aldactone/ Appreciate cardiology input.    SVT in 8/27 - NSR. continue Coreg  Low-grade fever: Resolved.  Imaging question aspiration pneumonia, was on cefepime completed on 9/9.   Respiratory failure with trach collar in place.  Continue pulmonary hygiene, continue respiratory support speech following closely,downsized to # 4 cuffless 9/15 by PCCM  Thrombocytosis: Monitor, plt peaked 722k> 572k > 648> 568. monitor intermittently..  Diarrhea: Off Flexi-Seal.  Hold laxatives.  Continue Imodium as needed she is on tube feeding likely explaining her diarrhea.    DVT prophylaxis: enoxaparin (LOVENOX) injection 40 mg Start: 10/07/19 1000 Place and maintain sequential compression device Start: 09/24/19 0136 Code Status:   Code Status: Full Code  Family Communication: Left message for patient's mother  Status is: Inpatient Remains inpatient appropriate because:IV treatments appropriate due to intensity of illness or inability to take PO and Inpatient level of care appropriate due to severity of illness  Dispo: The patient is from: Home              Anticipated d/c is to: LTAC vs SNF- PER CARE TEAM wiil need trach in place for at least 30 days prior to going to SNF, which would be 9/30.              Anticipated d/c date is: Once bed  available              Patient currently medically stable for SNF  Nutrition: Diet Order    None      Nutrition Problem: Increased nutrient needs Etiology: post-op healing Signs/Symptoms: estimated needs Interventions: Tube feeding Body mass index is 23.39  kg/m.  Consultants:see note  Procedures:see note Microbiology:see note Blood Culture    Component Value Date/Time   SDES URINE, RANDOM 10/08/2019 1316   SPECREQUEST NONE 10/08/2019 1316   CULT  10/08/2019 1316    NO GROWTH Performed at [facility] [address] [place], [place] [address]    REPTSTATUS 10/09/2019 FINAL 10/08/2019 1316    Other culture-see note  Medications: Scheduled Meds: . aspirin  81 mg Per Tube Daily  . atorvastatin  80 mg Per Tube Daily  . carvedilol  6.25 mg Per Tube BID WC  . chlorhexidine gluconate (MEDLINE KIT)  15 mL Mouth Rinse BID  . Chlorhexidine Gluconate Cloth  6 each Topical Daily  . digoxin  0.125 mg Per Tube Daily  . enoxaparin (LOVENOX) injection  40 mg Subcutaneous Q24H  . feeding supplement (PROSource TF)  45 mL Per Tube QID  . insulin aspart  0-15 Units Subcutaneous Q4H  . mouth rinse  15 mL Mouth Rinse 10 times per day  . prenatal multivitamin  1 tablet Per Tube Q1200  . sacubitril-valsartan  1 tablet Per Tube BID  . scopolamine  1 patch Transdermal Q72H  . sodium chloride flush  10-40 mL Intracatheter Q12H  . spironolactone  25 mg Per Tube Daily   Continuous Infusions: . sodium chloride 10 mL/hr at 10/16/19 1600  . feeding supplement (OSMOLITE 1.5 CAL) 1,000 mL (10/24/19 0515)    Antimicrobials: Anti-infectives (From admission, onward)   Start     Dose/Rate Route Frequency Ordered Stop   10/09/19 2100  vancomycin (VANCOREADY) IVPB 1500 mg/300 mL  Status:  Discontinued        1,500 mg 150 mL/hr over 120 Minutes Intravenous Every 12 hours 10/09/19 1237 10/10/19 1121   10/09/19 0800  ceFEPIme (MAXIPIME) 2 g in sodium chloride 0.9 % 100 mL IVPB        2 g 200 mL/hr over 30 Minutes Intravenous Every 8 hours 10/09/19 0752 10/15/19 2147   10/09/19 0800  vancomycin (VANCOREADY) IVPB 2000 mg/400 mL        2,000 mg 200 mL/hr over 120 Minutes Intravenous  Once 10/09/19 0752 10/09/19 1147   10/07/19 1540  ceFAZolin (ANCEF)  2-4 GM/100ML-% IVPB       Note to Pharmacy: [name] Manges   : cabinet override      10/07/19 1540 10/08/19 0344   10/07/19 1400  ceFAZolin (ANCEF) IVPB 2g/100 mL premix        2 g 200 mL/hr over 30 Minutes Intravenous To Radiology 10/06/19 1339 10/07/19 1627   09/30/19 0300  vancomycin (VANCOCIN) IVPB 1000 mg/200 mL premix  Status:  Discontinued        1,000 mg 200 mL/hr over 60 Minutes Intravenous Every 8 hours 09/29/19 1809 10/01/19 0853   09/29/19 1815  ceFEPIme (MAXIPIME) 2 g in sodium chloride 0.9 % 100 mL IVPB        2 g 200 mL/hr over 30 Minutes Intravenous Every 8 hours 09/29/19 1803 10/05/19 1801   09/29/19 1815  vancomycin (VANCOREADY) IVPB 2000 mg/400 mL        2,000 mg 200 mL/hr over 120 Minutes Intravenous  Once 09/29/19 1803 09/29/19  2137   09/24/19 2200  cefTRIAXone (ROCEPHIN) 2 g in sodium chloride 0.9 % 100 mL IVPB        2 g 200 mL/hr over 30 Minutes Intravenous Daily at bedtime 09/24/19 1201 09/28/19 2338   09/24/19 0330  cefTRIAXone (ROCEPHIN) 1 g in sodium chloride 0.9 % 100 mL IVPB  Status:  Discontinued        1 g 200 mL/hr over 30 Minutes Intravenous Daily at bedtime 09/24/19 0245 09/24/19 1201   09/24/19 0300  azithromycin (ZITHROMAX) 500 mg in sodium chloride 0.9 % 250 mL IVPB  Status:  Discontinued        500 mg 250 mL/hr over 60 Minutes Intravenous Daily at bedtime 09/24/19 0245 09/24/19 1103     Objective: Vitals: Today's Vitals   10/24/19 0801 10/24/19 0829 10/24/19 1200 10/24/19 1219  BP: 113/79 110/70 103/71 103/71  Pulse: 86 (!) 102 94 (!) 106  Resp: 18 15 (!) 22 (!) 21  Temp: 98.5 F (36.9 C)  98.5 F (36.9 C)   TempSrc: Axillary  Axillary   SpO2: 98% 100% 96% 97%  Weight:      Height:      PainSc:       No intake or output data in the 24 hours ending 10/24/19 1254 Filed Weights   10/11/19 0443 10/23/19 0427 10/24/19 0500  Weight: 96.3 kg 104.8 kg 73.9 kg   Weight change: -30.8 kg  Intake/Output from previous day: No intake/output  data recorded. Intake/Output this shift: No intake/output data recorded.  Examination: General exam: Awake, not following commands, nonverbal HEENT: Normocephalic, status post tracheostomy Respiratory system: Trach collar in place, breathing not labored Cardiovascular system: Regular rate and rhythm, S1-S2 Gastrointestinal system: Soft, hypoactive bowel sounds, PEG noted Nervous System: Spontaneously moves her extremities Extremities: No edema.  Data Reviewed: I have personally reviewed following labs and imaging studies CBC: Recent Labs  Lab 10/18/19 0513 10/19/19 0500 10/20/19 0500 10/21/19 1113  WBC 9.6 8.4 7.8 8.7  HGB 10.9* 10.4* 10.2* 10.2*  HCT 36.3 34.6* 33.9* 33.1*  MCV 83.4 83.2 83.3 84.4  PLT 713* 694* 641* 885*   Basic Metabolic Panel: Recent Labs  Lab 10/19/19 0500 10/20/19 0500 10/21/19 1113 10/22/19 0423 10/23/19 0516  NA 137 139 136 138 138  K 4.3 4.1 4.0 3.9 4.1  CL 102 104 103 103 102  CO2 25 26 25 26 24   GLUCOSE 116* 140* 123* 125* 115*  BUN 25* 21* 16 15 14   CREATININE 0.48 0.48 0.46 0.52 0.46  CALCIUM 9.8 9.8 9.7 10.0 10.0  MG  --   --   --   --  1.9   GFR: Estimated Creatinine Clearance: 109.2 mL/min (by C-G formula based on SCr of 0.46 mg/dL). Liver Function Tests: No results for input(s): AST, ALT, ALKPHOS, BILITOT, PROT, ALBUMIN in the last 168 hours. No results for input(s): LIPASE, AMYLASE in the last 168 hours. No results for input(s): AMMONIA in the last 168 hours. Coagulation Profile: No results for input(s): INR, PROTIME in the last 168 hours. Cardiac Enzymes: No results for input(s): CKTOTAL, CKMB, CKMBINDEX, TROPONINI in the last 168 hours. BNP (last 3 results) No results for input(s): PROBNP in the last 8760 hours. HbA1C: No results for input(s): HGBA1C in the last 72 hours. CBG: Recent Labs  Lab 10/23/19 2001 10/23/19 2357 10/24/19 0406 10/24/19 0757 10/24/19 1225  GLUCAP 114* 123* 122* 113* 108*   Lipid  Profile: No results for input(s): CHOL, HDL, LDLCALC, TRIG, CHOLHDL,  LDLDIRECT in the last 72 hours. Thyroid Function Tests: No results for input(s): TSH, T4TOTAL, FREET4, T3FREE, THYROIDAB in the last 72 hours. Anemia Panel: No results for input(s): VITAMINB12, FOLATE, FERRITIN, TIBC, IRON, RETICCTPCT in the last 72 hours. Sepsis Labs: No results for input(s): PROCALCITON, LATICACIDVEN in the last 168 hours.  No results found for this or any previous visit (from the past 240 hour(s)).   Radiology Studies: No results found.   LOS: 30 days   Annita Brod, MD [facility]  10/24/2019, 12:54 PM

## 2019-10-25 LAB — GLUCOSE, CAPILLARY
Glucose-Capillary: 113 mg/dL — ABNORMAL HIGH (ref 70–99)
Glucose-Capillary: 113 mg/dL — ABNORMAL HIGH (ref 70–99)
Glucose-Capillary: 119 mg/dL — ABNORMAL HIGH (ref 70–99)
Glucose-Capillary: 124 mg/dL — ABNORMAL HIGH (ref 70–99)
Glucose-Capillary: 136 mg/dL — ABNORMAL HIGH (ref 70–99)
Glucose-Capillary: 91 mg/dL (ref 70–99)

## 2019-10-25 NOTE — Progress Notes (Signed)
PROGRESS NOTE    Beverly [name]  XKG:[id number]  DOB: April 25, 1985  DOA: 09/24/2019 PCP: Patient, No Pcp Per Outpatient Specialists:   Hospital course:  Sad history of 34 year old female who presented to the hospital on 8/18, 2 weeks postpartum with chest pain and in the ED sustained V. fib arrest with V. fib x3 epi x1. EKG showed ST elevation and she underwent Cath Lab at [facility] which showed aneurysmal dilation(dissection?) of the LAD.Marland Kitchen Transferred to [facility] hospital for ICU admission.LVEF 30% EF in cath lab.  Echo 8/18>LVEF 30-35%, septal and apical akinesis distal anterior wall an d mid/distal inferior wall hypokinesis hyperdynamic basal function. LV size mildly dilated.  Patient suffered anoxic brain injury and subsequently underwent tracheostomy and PEG tube placement.  She continues to be encephalopathic, able to open eyes and track but not following any commands and not talking,is moving extremities independently.  Tracheostomy downsized on 9/15.  Current plan is for patient go to LTAC versus skilled nursing, the latter of which cannot take the patient until 30 days after tracheostomy placed, which would be 9/30.   Subjective:  Attentive mother is at bedside who thinks that her daughter is doing better.  She states that she seems to squeeze her hand when asked to do so and that she raised her leg when asked to do so by PT.  Patient herself is nonverbal however she does clearly look at me when I am speaking to her.  She closes her eyes and when I speak her name she opens her eyes and looks at me.  She seems to register that I am there.   Objective: Vitals:   10/25/19 0738 10/25/19 1024 10/25/19 1200 10/25/19 1215  BP: 111/74 96/72 106/71   Pulse: 95 95 97 98  Resp: 18  20 20   Temp: 97.8 F (36.6 C)  98.5 F (36.9 C)   TempSrc: Axillary  Oral   SpO2: 100%  98% 98%  Weight:      Height:        Intake/Output Summary (Last 24 hours) at 10/25/2019 1555 Last data filed  at 10/25/2019 1119 Gross per 24 hour  Intake 120 ml  Output --  Net 120 ml   Filed Weights   10/23/19 0427 10/24/19 0500 10/25/19 0639  Weight: 104.8 kg 73.9 kg 73.6 kg     Exam:  General: Patient lying in bed with diapers on kicking her legs restlessly eyes intermittently closed and intermittently looking at me. Eyes: sclera anicteric, conjuctiva mild injection bilaterally CVS: S1-S2, regular  Respiratory:  decreased air entry bilaterally secondary to decreased inspiratory effort, rales at bases  GI: NABS, soft, NT  LE: No edema.  Neuro: Patient has some sort of altered state of consciousness  Assessment & Plan:   Altered state of consciousness Possibly secondary to anoxic encephalopathy that is post V. fib arrest EEG showed diffuse slowing but improving, brain MRI is within normal limits Per report she seems to be improving She is trach and PEG dependent Mother is at bedside Awaiting LTAC placement  V. fib arrest in the setting of anterior MI Cardiac catheter shows aneurysmal proximal LAD with dissection Patient is on aspirin 81, Entresto, Coreg and atorvastatin  HFrEF EF 35%, appears euvolemic at present Being followed by cardiology intermittently, Entresto was cut down due to lower BP Continue carvedilol, Aldactone, digoxin  HTN Continue Entresto, carvedilol and Aldactone  History of SVT Only in NSR, continue carvedilol  Diarrhea Thought to be secondary to tube  feed, as needed Imodium   DVT prophylaxis: SCD Code Status: Full Family Communication: Patient's mother was at bedside Disposition Plan:   Patient is from: Home  Anticipated Discharge Location: LTAC  Barriers to Discharge: Altered state of consciousness, trach and PEG dependent  Is patient medically stable for Discharge: Yes when LTAC bed is available   Consultants:  Cardiology  Procedures:  None  Antimicrobials:  None   Data Reviewed:  Basic Metabolic Panel: Recent Labs  Lab  10/19/19 0500 10/20/19 0500 10/21/19 1113 10/22/19 0423 10/23/19 0516  NA 137 139 136 138 138  K 4.3 4.1 4.0 3.9 4.1  CL 102 104 103 103 102  CO2 25 26 25 26 24   GLUCOSE 116* 140* 123* 125* 115*  BUN 25* 21* 16 15 14   CREATININE 0.48 0.48 0.46 0.52 0.46  CALCIUM 9.8 9.8 9.7 10.0 10.0  MG  --   --   --   --  1.9   Liver Function Tests: No results for input(s): AST, ALT, ALKPHOS, BILITOT, PROT, ALBUMIN in the last 168 hours. No results for input(s): LIPASE, AMYLASE in the last 168 hours. No results for input(s): AMMONIA in the last 168 hours. CBC: Recent Labs  Lab 10/19/19 0500 10/20/19 0500 10/21/19 1113  WBC 8.4 7.8 8.7  HGB 10.4* 10.2* 10.2*  HCT 34.6* 33.9* 33.1*  MCV 83.2 83.3 84.4  PLT 694* 641* 568*   Cardiac Enzymes: No results for input(s): CKTOTAL, CKMB, CKMBINDEX, TROPONINI in the last 168 hours. BNP (last 3 results) No results for input(s): PROBNP in the last 8760 hours. CBG: Recent Labs  Lab 10/24/19 1953 10/25/19 0004 10/25/19 0342 10/25/19 0755 10/25/19 1207  GLUCAP 130* 113* 113* 136* 124*    No results found for this or any previous visit (from the past 240 hour(s)).    Studies: No results found.   Scheduled Meds:  aspirin  81 mg Per Tube Daily   atorvastatin  80 mg Per Tube Daily   carvedilol  6.25 mg Per Tube BID WC   chlorhexidine gluconate (MEDLINE KIT)  15 mL Mouth Rinse BID   Chlorhexidine Gluconate Cloth  6 each Topical Daily   digoxin  0.125 mg Per Tube Daily   enoxaparin (LOVENOX) injection  40 mg Subcutaneous Q24H   feeding supplement (PROSource TF)  45 mL Per Tube QID   insulin aspart  0-15 Units Subcutaneous Q4H   mouth rinse  15 mL Mouth Rinse 10 times per day   prenatal multivitamin  1 tablet Per Tube Q1200   sacubitril-valsartan  1 tablet Per Tube BID   scopolamine  1 patch Transdermal Q72H   sodium chloride flush  10-40 mL Intracatheter Q12H   spironolactone  25 mg Per Tube Daily   Continuous  Infusions:  sodium chloride 10 mL/hr at 10/16/19 1600   feeding supplement (OSMOLITE 1.5 CAL) 60 mL/hr at 10/24/19 2000    Active Problems:   Cardiac arrest [facility])   Acute combined systolic and diastolic heart failure (HCC)   Ventricular tachycardia, polymorphic ([place])   Encounter for central line placement   Respiratory failure ([place])   Acute metabolic encephalopathy     Tyria Springer Derek Jack, [facility]  If 7PM-7AM, please contact night-coverage [email or web address] Password [email or web address] 10/25/2019, 3:55 PM    LOS: 31 days

## 2019-10-25 NOTE — Progress Notes (Signed)
Patients family (mother) in room during reassessment. Patient able to smile and frown on command when asked by family member. She was previously not able to complete this task in the AM. This may have been in relation to being more drowsy this AM. She was also able to kick both legs individually on command when asked by patients mother. Still no verbal, attempt at verbal, or any other form of communication. This includes blinking the eyes and/or hand gestures.   Beverly [name]

## 2019-10-26 LAB — GLUCOSE, CAPILLARY
Glucose-Capillary: 103 mg/dL — ABNORMAL HIGH (ref 70–99)
Glucose-Capillary: 111 mg/dL — ABNORMAL HIGH (ref 70–99)
Glucose-Capillary: 122 mg/dL — ABNORMAL HIGH (ref 70–99)
Glucose-Capillary: 129 mg/dL — ABNORMAL HIGH (ref 70–99)
Glucose-Capillary: 96 mg/dL (ref 70–99)
Glucose-Capillary: 99 mg/dL (ref 70–99)

## 2019-10-26 NOTE — TOC Progression Note (Signed)
Transition of Care [facility]) - Progression Note    Patient Details  Name: Beverly [name] MRN: [id number] Date of Birth: November 08, 1985  Transition of Care [facility]) CM/SW Contact  Eduard Roux, [place] Phone Number: 10/26/2019, 9:32 AM  Clinical Narrative:     Patient has no bed offers.  [facility] Financial Counselor Arline Asp has confirmed she has received CSW email request to screen and review patient for Medicaid.  Antony Blackbird, MSW, LCSWA Clinical Social Worker   Expected Discharge Plan: Skilled Nursing Facility Barriers to Discharge: Continued Medical Work up, SNF Pending Medicaid, SNF Pending bed offer  Expected Discharge Plan and Services Expected Discharge Plan: Skilled Nursing Facility In-house Referral: Clinical Social Work                                             Social Determinants of Health (SDOH) Interventions    Readmission Risk Interventions No flowsheet data found.

## 2019-10-26 NOTE — Progress Notes (Signed)
PROGRESS NOTE    Beverly [name]  BSW:[id number]  DOB: Feb 23, 1985  DOA: 09/24/2019 PCP: Patient, No Pcp Per Outpatient Specialists:   Hospital course:  Sad history of 34 year old female who presented to the hospital on 8/18, 2 weeks postpartum with chest pain and in the ED sustained V. fib arrest with V. fib x3 epi x1. EKG showed ST elevation and she underwent Cath Lab at [facility] which showed aneurysmal dilation(dissection?) of the LAD.Marland Kitchen Transferred to [facility] hospital for ICU admission.LVEF 30% EF in cath lab.  Echo 8/18>LVEF 30-35%, septal and apical akinesis distal anterior wall an d mid/distal inferior wall hypokinesis hyperdynamic basal function. LV size mildly dilated.  Patient suffered anoxic brain injury and subsequently underwent tracheostomy and PEG tube placement.  She continues to be encephalopathic, able to open eyes and track but not following any commands and not talking,is moving extremities independently.  Tracheostomy downsized on 9/15.  Current plan is for patient go to LTAC versus skilled nursing, the latter of which cannot take the patient until 30 days after tracheostomy placed, which would be 9/30.   Subjective:  Patient is sleeping.  She opens her eyes and looks at me when I touch her.  She does not respond when I asked her if she is in pain.  She does move her hands purposefully to her belly when I examine her does not seem to be in pain.  Objective: Vitals:   10/26/19 0802 10/26/19 0836 10/26/19 1200 10/26/19 1213  BP: 99/63  100/68   Pulse: 96 91 99 91  Resp: 18 18 18 16   Temp: (!) 97.5 F (36.4 C)  98.5 F (36.9 C)   TempSrc: Axillary  Oral   SpO2: 99% 99% 95% 99%  Weight:      Height:        Intake/Output Summary (Last 24 hours) at 10/26/2019 1302 Last data filed at 10/26/2019 0600 Gross per 24 hour  Intake 720 ml  Output --  Net 720 ml   Filed Weights   10/24/19 0500 10/25/19 0639 10/26/19 0418  Weight: 73.9 kg 73.6 kg 69.9 kg      Exam:  General: Patient lying in bed sleeping.  As noted above, patient does move her hands volitionally to her stomach when I examine her.  Does open her eyes and look at me when I am examining her. eyes: sclera anicteric, conjuctiva mild injection bilaterally CVS: S1-S2, regular  Respiratory:  decreased air entry bilaterally secondary to decreased inspiratory effort, rales at bases  GI: NABS, soft, NT  LE: No edema.  Neuro: Patient has some sort of altered state of consciousness  Assessment & Plan:   Altered state of consciousness Possibly secondary to anoxic encephalopathy that is post V. fib arrest EEG showed diffuse slowing but improving, brain MRI is within normal limits Per report she seems to be improving She is trach and PEG dependent Awaiting LTAC placement  V. fib arrest in the setting of anterior MI Cardiac catheter shows aneurysmal proximal LAD with dissection Patient is on aspirin 81, Entresto, Coreg and atorvastatin  HFrEF EF 35%, appears euvolemic at present Being followed by cardiology intermittently, Entresto was cut down due to lower BP Continue carvedilol, Aldactone, digoxin  HTN Continue Entresto, carvedilol and Aldactone  History of SVT Only in NSR, continue carvedilol  Diarrhea Thought to be secondary to tube feed, as needed Imodium   DVT prophylaxis: SCD Code Status: Full Family Communication: Patient's mother was at bedside Disposition Plan:  Patient is from: Home  Anticipated Discharge Location: LTAC  Barriers to Discharge: Altered state of consciousness, trach and PEG dependent  Is patient medically stable for Discharge: Yes when LTAC bed is available   Consultants:  Cardiology  Procedures:  None  Antimicrobials:  None   Data Reviewed:  Basic Metabolic Panel: Recent Labs  Lab 10/20/19 0500 10/21/19 1113 10/22/19 0423 10/23/19 0516  NA 139 136 138 138  K 4.1 4.0 3.9 4.1  CL 104 103 103 102  CO2 26 25 26 24    GLUCOSE 140* 123* 125* 115*  BUN 21* 16 15 14   CREATININE 0.48 0.46 0.52 0.46  CALCIUM 9.8 9.7 10.0 10.0  MG  --   --   --  1.9   Liver Function Tests: No results for input(s): AST, ALT, ALKPHOS, BILITOT, PROT, ALBUMIN in the last 168 hours. No results for input(s): LIPASE, AMYLASE in the last 168 hours. No results for input(s): AMMONIA in the last 168 hours. CBC: Recent Labs  Lab 10/20/19 0500 10/21/19 1113  WBC 7.8 8.7  HGB 10.2* 10.2*  HCT 33.9* 33.1*  MCV 83.3 84.4  PLT 641* 568*   Cardiac Enzymes: No results for input(s): CKTOTAL, CKMB, CKMBINDEX, TROPONINI in the last 168 hours. BNP (last 3 results) No results for input(s): PROBNP in the last 8760 hours. CBG: Recent Labs  Lab 10/25/19 2022 10/26/19 0008 10/26/19 0421 10/26/19 0814 10/26/19 1214  GLUCAP 91 111* 99 96 129*    No results found for this or any previous visit (from the past 240 hour(s)).    Studies: No results found.   Scheduled Meds: . aspirin  81 mg Per Tube Daily  . atorvastatin  80 mg Per Tube Daily  . carvedilol  6.25 mg Per Tube BID WC  . chlorhexidine gluconate (MEDLINE KIT)  15 mL Mouth Rinse BID  . Chlorhexidine Gluconate Cloth  6 each Topical Daily  . digoxin  0.125 mg Per Tube Daily  . enoxaparin (LOVENOX) injection  40 mg Subcutaneous Q24H  . feeding supplement (PROSource TF)  45 mL Per Tube QID  . insulin aspart  0-15 Units Subcutaneous Q4H  . mouth rinse  15 mL Mouth Rinse 10 times per day  . prenatal multivitamin  1 tablet Per Tube Q1200  . sacubitril-valsartan  1 tablet Per Tube BID  . scopolamine  1 patch Transdermal Q72H  . sodium chloride flush  10-40 mL Intracatheter Q12H  . spironolactone  25 mg Per Tube Daily   Continuous Infusions: . sodium chloride 10 mL/hr at 10/16/19 1600  . feeding supplement (OSMOLITE 1.5 CAL) 60 mL/hr at 10/26/19 0600    Active Problems:   Cardiac arrest [facility])   Acute combined systolic and diastolic heart failure (HCC)   Ventricular  tachycardia, polymorphic ([place])   Encounter for central line placement   Respiratory failure ([place])   Acute metabolic encephalopathy     Chauncy Mangiaracina Derek Jack, [facility]  If 7PM-7AM, please contact night-coverage [email or web address] Password [email or web address] 10/26/2019, 1:02 PM    LOS: 32 days

## 2019-10-27 DIAGNOSIS — J9601 Acute respiratory failure with hypoxia: Secondary | ICD-10-CM

## 2019-10-27 LAB — BASIC METABOLIC PANEL
Anion gap: 11 (ref 5–15)
BUN: 14 mg/dL (ref 6–20)
CO2: 26 mmol/L (ref 22–32)
Calcium: 9.9 mg/dL (ref 8.9–10.3)
Chloride: 100 mmol/L (ref 98–111)
Creatinine, Ser: 0.56 mg/dL (ref 0.44–1.00)
GFR calc Af Amer: >60 mL/min (ref >60)
GFR calc non Af Amer: >60 mL/min (ref >60)
Glucose, Bld: 145 mg/dL — ABNORMAL HIGH (ref 70–99)
Potassium: 3.8 mmol/L (ref 3.5–5.1)
Sodium: 137 mmol/L (ref 135–145)

## 2019-10-27 LAB — GLUCOSE, CAPILLARY
Glucose-Capillary: 101 mg/dL — ABNORMAL HIGH (ref 70–99)
Glucose-Capillary: 110 mg/dL — ABNORMAL HIGH (ref 70–99)
Glucose-Capillary: 112 mg/dL — ABNORMAL HIGH (ref 70–99)
Glucose-Capillary: 117 mg/dL — ABNORMAL HIGH (ref 70–99)
Glucose-Capillary: 122 mg/dL — ABNORMAL HIGH (ref 70–99)

## 2019-10-27 LAB — MAGNESIUM: Magnesium: 2 mg/dL (ref 1.7–2.4)

## 2019-10-27 MED ORDER — METOPROLOL TARTRATE 5 MG/5ML IV SOLN
INTRAVENOUS | Status: AC
  Filled 2019-10-27: qty 5

## 2019-10-27 MED ORDER — INFLUENZA VAC SPLIT QUAD 0.5 ML IM SUSY
0.5000 mL | PREFILLED_SYRINGE | INTRAMUSCULAR | Status: DC
  Filled 2019-10-27 (×3): qty 0.5

## 2019-10-27 NOTE — Progress Notes (Signed)
ADVANCED HF PROGRESS  Patient Name: Beverly [name] Date of Encounter: 10/27/2019  [facility] Cardiologist: No primary care provider on file.   Subjective   Patient currently sleeping on trach collar. She appears comfortable. Respirations unlabored.   I reviewed notes and discussed with nursing staff. She has been more alert and following some commands.   Inpatient Medications    Scheduled Meds: . aspirin  81 mg Per Tube Daily  . atorvastatin  80 mg Per Tube Daily  . carvedilol  6.25 mg Per Tube BID WC  . chlorhexidine gluconate (MEDLINE KIT)  15 mL Mouth Rinse BID  . Chlorhexidine Gluconate Cloth  6 each Topical Daily  . digoxin  0.125 mg Per Tube Daily  . enoxaparin (LOVENOX) injection  40 mg Subcutaneous Q24H  . feeding supplement (PROSource TF)  45 mL Per Tube QID  . insulin aspart  0-15 Units Subcutaneous Q4H  . mouth rinse  15 mL Mouth Rinse 10 times per day  . prenatal multivitamin  1 tablet Per Tube Q1200  . sacubitril-valsartan  1 tablet Per Tube BID  . scopolamine  1 patch Transdermal Q72H  . sodium chloride flush  10-40 mL Intracatheter Q12H  . spironolactone  25 mg Per Tube Daily   Continuous Infusions: . sodium chloride 10 mL/hr at 10/16/19 1600  . feeding supplement (OSMOLITE 1.5 CAL) 60 mL/hr at 10/27/19 0400   PRN Meds: acetaminophen (TYLENOL) oral liquid 160 mg/5 mL, diphenoxylate-atropine, hydrALAZINE, sodium chloride flush   Vital Signs    Vitals:   10/27/19 0436 10/27/19 0440 10/27/19 0822 10/27/19 0903  BP: 102/82  123/86   Pulse: (!) 123  (!) 101   Resp: 20  (!) 28   Temp: 98.3 F (36.8 C)  98.2 F (36.8 C) 98.2 F (36.8 C)  TempSrc: Oral  Oral Oral  SpO2: 96%  98%   Weight:  73.5 kg    Height:        Intake/Output Summary (Last 24 hours) at 10/27/2019 1046 Last data filed at 10/27/2019 0500 Gross per 24 hour  Intake 1320 ml  Output --  Net 1320 ml   Last 3 Weights 10/27/2019 10/26/2019 10/25/2019  Weight (lbs) 162 lb 154 lb 162  lb 2.7 oz  Weight (kg) 73.483 kg 69.854 kg 73.56 kg  Some encounter information is confidential and restricted. Go to Review Flowsheets activity to see all data.       Telemetry   NSR 90-100 Personally reviewed   Physical Exam   General: Asleep on trach. No resp difficulty HEENT: normal Neck: supple. +trach. Carotids 2+ bilat; no bruits. No lymphadenopathy or thryomegaly appreciated. Cor: PMI nondisplaced. Regular rate & rhythm. No rubs, gallops or murmurs. Lungs: clear Abdomen: obese soft, nontender, nondistended. No hepatosplenomegaly. No bruits or masses. Good bowel sounds. Extremities: no cyanosis, clubbing, rash, edema Neuro: asleep will awaken and interact some according to nurses  Labs    High Sensitivity Troponin:   No results for input(s): TROPONINIHS in the last 720 hours.    Chemistry Recent Labs  Lab 10/21/19 1113 10/22/19 0423 10/23/19 0516  NA 136 138 138  K 4.0 3.9 4.1  CL 103 103 102  CO2 _0 GLUCOSE 123* 125* 115*  BUN _1 CREATININE 0.46 0.52 0.46  CALCIUM 9.7 10.0 10.0  GFRNONAA >60 >60 >60  GFRAA >60 >60 >60  ANIONGAP _2 Hematology Recent Labs  Lab 10/21/19 1113  WBC  8.7  RBC 3.92  HGB 10.2*  HCT 33.1*  MCV 84.4  MCH 26.0  MCHC 30.8  RDW 17.2*  PLT 568*    BNP No results for input(s): BNP, PROBNP in the last 168 hours.   DDimer No results for input(s): DDIMER in the last 168 hours.   Radiology    No results found.  Cardiac Studies   See section above (reviewed)  Patient Profile     34 y.o. female 2 weeks post-partum with VF/torsades arrest in the setting of anterior MI  Assessment & Plan    1. VF arrest (in-hospital): in setting of anterior infarct. - Rhythm stable unfortunately with severe anoxic brain injury. (see below) - EEG 8/20 diffuse slowing but improved - Brain MRI normal x 2. EEG still with diffuse slowing - Remains on vent through trach.  - No VT on tele. Keep K> 4.0 Mg > 2.0  2.  Acute systolic heart failure: secondary to anterior MI, 09/24/19 LVEF 35%.  -Volume status and weight stable. May be a little dry given tachycardia - On carvedilol 6.25 bid - Continue Entresto 24-26 mg bid dose cut back due to soft BP.  - Continue dig 0.125  - Continue spiro 25 mg daily.  3. CAD/Anterior MI: angio reviewed again. Appears to have aneurysmal proximal LAD with dissection, likely culprit. Had TIMI-3 flow at cath but large infarct.  - No current s/s ischemia - Continue ASA,  beta-blocker and statin.  - Continue medical therapy for now 4. Encephalopathy, post-arrest. Likely anoxic brain injury  - MRI 09/29/19 & 10/07/19 negative.  - EEG 8/31 EEGwith diffuse slowing 5. Hypokalemia/hypomag - Repeat BMET pending 6. Hypertension - stable today  7. Fevers -Remains afebrile.  - Abx per TRH 8. SVT on 10/03/19 - Resolved. Continue current dose of carvedilol.      Signed, Glori Bickers, MD  10/27/2019, 10:46 AM

## 2019-10-27 NOTE — Progress Notes (Addendum)
NAME:  Beverly [name], MRN:  [id number], DOB:  1985-11-11, LOS: 33 ADMISSION DATE:  09/24/2019, CONSULTATION DATE:  8/18 REFERRING MD:  Dr. Eldridge Dace, CHIEF COMPLAINT:  VF arrest   Brief History   34 year old female 2 weeks post partum presented with chest pain and suffered VF arrest.   History of present illness   Patient is encephalopathic and/or intubated. Therefore history has been obtained from chart review.  34 year old female with PMH as below, which is significant for HTN and 2 weeks post-partum. She had emergency C-section delivery due to premature rupture of membranes. She did have some gestational hypertension, but her pregnancy was otherwise uncomplicated. On 8/17 she developed dull chest pain shortly after eating, which prompted ED presentation at [facility]. Resolved spontaneously. No associated symptoms.   Past Medical History   has a past medical history of Hypertension and Vaginal Pap smear, abnormal.  Significant Hospital Events   8/17 chest pain, VF arrest; While in ED, she suffered VT > VF arrest with ROSC after defib x 3 and epi x 1. Immediately after arrest ST elevation was noted on EKG, which did improve on repeat. Differential ACS, SCAD, and toresades/metabolic. Electrolytes were replaced. She was taken to the cath lab at [facility]  Where aneurysmal dilation of the ostial and proximal LAD and was transferred to [facility] for ICU admission.  8/23 possibly febrile. Pan-cultures sent. Still on full support. Gets hypertensive when sedation let up. abx started later in day when temp >101 8/24 opens eyes to stimulus. + strong cough. Tolerating PSV but mental status still not supporting extubation  8/25 about the same  8/27: Seems to be moving a little bit more but mostly posturing.  Opening eyes more.  Had a run of SVT. 8/30 perc trach by PCCM  8/31 PEG with IR 9/2 started on vanc/cefepime for VAP coverage 9/4 transferred out of ICU  Consults:  PCCM   Procedures:    8/18> Central Line, removed 8/26 PICC line placed 8/30 trach 8/31 PEG  Significant Diagnostic Tests:  [facility] 8/17 > No coronary artery occlusion or critical stenosis. There is aneurysmal dilation of the ostial and proximal LAD with possible ulceration or dissection of uncertain chronicity.  TIMI-3 flow is noted throughout the LAD and its branches. Severely reduced left ventricular systolic function with mid/apical anterior, apical, and apical inferior hypokinesis/akinesis. LV gram 30% EF.  CT head 8/17 > normal  CTA chest 8/17 > no evidence of PE, moderate severity infiltrates vs ATX, small bilateral pleural effusions. Very small pericaradial effusion.  CT abdomen/pelvis 8/17 > Enlarged, heterogeneous uterus, Small amount of fluid seen in between the right kidney and right lobe of the liver. Echo 8/18> LVEF 30-35%, septal and apical akinesis distal anterior wall an d mid/distal inferior wall hypokinesis hyperdynamic basal function. LV size mildly dilated.  MRI brain 8/23: Normal  Micro Data:  See micro tab Blood 9/1 >> ngtd Sputum 9/1 >staph aureus, enterobacter aerogenes   Antimicrobials:  Vanc 9/2>> 9/3 Cefepime 9/2>> 9/8  Interim history/subjective:  No acute events.  Eyes open, unable to communicate.  Objective   Blood pressure 96/67, pulse 95, temperature 98 F (36.7 C), temperature source Oral, resp. rate (!) 23, height 5\' 10"  (1.778 m), weight 73.5 kg, SpO2 100 %, unknown if currently breastfeeding.    FiO2 (%):  [21 %-28 %] 28 %   Intake/Output Summary (Last 24 hours) at 10/27/2019 1323 Last data filed at 10/27/2019 0500 Gross per 24  hour  Intake 1320 ml  Output --  Net 1320 ml   Filed Weights   10/25/19 0639 10/26/19 0418 10/27/19 0440  Weight: 73.6 kg 69.9 kg 73.5 kg    Examination: GEN: WDWN adult F, supine in bed NAD  HEENT: #4 cuffless trach secure. Trachea midline. Anicteric sclera  CV: RRR s1s2 no rgm cap refill brisk  PULM: Even, unlabored respirations.  Scant secretions. CTA bilaterally  GI: Soft round ndnt  EXT: Symmetrical bulk and tone no clubbing or cyanosis.  NEURO: Awake, tracks but does not follow commands. Moving BUE BLE spontaneously  SKIN: c/d/w without rash    Assessment & Plan:   Acute Hypoxemic Respiratory Failure 2/2 to Cardiac Arrest, tracheostomy status (currently with #4 cuffless)  -resp failure c/b Pulmonary Edema, Enterobacter and MSSA pneumonia (s/p course of cefepime). - s/p tracheostomy 8/30, transferred out of ICU on ATC 9/4. P - Continue ATC - Continue pulm hygiene  - Continue routine trach care    Rest per primary team.  PCCM will continue to follow for trach once weekly.  Please call if needs arise sooner.    Rutherford Guys, [place] [facility] 10/27/2019, 1:31 PM

## 2019-10-27 NOTE — Progress Notes (Signed)
Pt. Restless and seems to be a little more with it compared to yesterday. Pt. Throwing legs over bedrails. Have talked with pt. And have

## 2019-10-27 NOTE — Progress Notes (Signed)
Pt very restless in bed. Assisted by staff to sit on edge of bed for a few minutes. Pt weak and needed help supporting upper body. Oxygen saturation remained above 95%.  Versie Starks, RN

## 2019-10-27 NOTE — TOC Progression Note (Signed)
Transition of Care [facility]) - Progression Note    Patient Details  Name: Beverly [name] MRN: [id number] Date of Birth: 01-Nov-1985  [facility]) CM/SW Contact  Eduard Roux, [place] Phone Number: 10/27/2019, 12:26 PM  Clinical Narrative:     Patient has no SNF bed offers.  Antony Blackbird, MSW, LCSWA Clinical Social Worker   Expected Discharge Plan: Skilled Nursing Facility Barriers to Discharge: Continued Medical Work up, SNF Pending Medicaid, SNF Pending bed offer  Expected Discharge Plan and Services Expected Discharge Plan: Skilled Nursing Facility In-house Referral: Clinical Social Work                                             Social Determinants of Health (SDOH) Interventions    Readmission Risk Interventions No flowsheet data found.

## 2019-10-27 NOTE — Progress Notes (Signed)
PROGRESS NOTE    Beverly [name]  JTT:[id number]  DOB: [birth year]  DOA: 09/24/2019 PCP: Patient, No Pcp Per Outpatient Specialists:   Hospital course:  Sad history of 35 year old female who presented to the hospital on 8/18, 2 weeks postpartum with chest pain and in the ED sustained V. fib arrest with V. fib x3 epi x1. EKG showed ST elevation and she underwent Cath Lab at [facility] which showed aneurysmal dilation(dissection?) of the LAD.Marland Kitchen Transferred to [facility] hospital for ICU admission.LVEF 30% EF in cath lab.  Echo 8/18>LVEF 30-35%, septal and apical akinesis distal anterior wall an d mid/distal inferior wall hypokinesis hyperdynamic basal function. LV size mildly dilated.  Patient suffered anoxic brain injury and subsequently underwent tracheostomy and PEG tube placement.  She continues to be encephalopathic, able to open eyes and track but not following any commands and not talking,is moving extremities independently.  Tracheostomy downsized on 9/15.  Current plan is for patient go to LTAC versus skilled nursing, the latter of which cannot take the patient until 30 days after tracheostomy placed, which would be 9/30.   Subjective:  Patient is sleeping.  She opens her eyes when I speak to her and then closes her eyes again.  When I asked her to look at me she opens her eyes turns her head and looks at me and then closes her eyes.  Objective: Vitals:   10/27/19 1211 10/27/19 1215 10/27/19 1540 10/27/19 1556  BP: 96/67  126/67 126/67  Pulse: 92 95 (!) 111 (!) 101  Resp:  (!) 23  20  Temp: 98 F (36.7 C)  98.3 F (36.8 C)   TempSrc: Oral  Oral   SpO2: 99% 100% 99% 97%  Weight:      Height:        Intake/Output Summary (Last 24 hours) at 10/27/2019 1610 Last data filed at 10/27/2019 1220 Gross per 24 hour  Intake 1320 ml  Output --  Net 1320 ml   Filed Weights   10/25/19 0639 10/26/19 0418 10/27/19 0440  Weight: 73.6 kg 69.9 kg 73.5 kg     Exam:  General: Patient  lying in bed sleeping.  As noted above, does open her eyes and look at me with focal instructions.   Eyes: sclera anicteric, conjuctiva mild injection bilaterally CVS: S1-S2, regular  Respiratory:  decreased air entry bilaterally secondary to decreased inspiratory effort, rales at bases  GI: NABS, abdomen is a little more distended than it was yesterday but it is still soft and it is not tender to light or deep palpation. LE: No edema.  Neuro: Patient has some sort of altered state of consciousness  Assessment & Plan:   Altered state of consciousness Possibly secondary to anoxic encephalopathy that is post V. fib arrest EEG showed diffuse slowing but improving, brain MRI is within normal limits Per report she seems to be improving She is trach and PEG dependent Awaiting LTAC placement  V. fib arrest in the setting of anterior MI Cardiac catheter shows aneurysmal proximal LAD with dissection Patient is on aspirin 81, Entresto, Coreg and atorvastatin  HFrEF EF 35%, appears euvolemic at present Being followed by cardiology intermittently, Entresto was cut down due to lower BP Continue carvedilol, Aldactone, digoxin  HTN Continue Entresto, carvedilol and Aldactone  History of SVT Only in NSR, continue carvedilol  Diarrhea Thought to be secondary to tube feed, as needed Imodium   DVT prophylaxis: SCD Code Status: Full Family Communication: Patient's mother was at bedside Disposition  Plan:   Patient is from: Home  Anticipated Discharge Location: LTAC  Barriers to Discharge: Altered state of consciousness, trach and PEG dependent  Is patient medically stable for Discharge: Yes when LTAC bed is available   Consultants:  Cardiology  Procedures:  None  Antimicrobials:  None   Data Reviewed:  Basic Metabolic Panel: Recent Labs  Lab 10/21/19 1113 10/22/19 0423 10/23/19 0516 10/27/19 0500  NA 136 138 138 137  K 4.0 3.9 4.1 3.8  CL 103 103 102 100  CO2 25 26  24 26   GLUCOSE 123* 125* 115* 145*  BUN 16 15 14 14   CREATININE 0.46 0.52 0.46 0.56  CALCIUM 9.7 10.0 10.0 9.9  MG  --   --  1.9 2.0   Liver Function Tests: No results for input(s): AST, ALT, ALKPHOS, BILITOT, PROT, ALBUMIN in the last 168 hours. No results for input(s): LIPASE, AMYLASE in the last 168 hours. No results for input(s): AMMONIA in the last 168 hours. CBC: Recent Labs  Lab 10/21/19 1113  WBC 8.7  HGB 10.2*  HCT 33.1*  MCV 84.4  PLT 568*   Cardiac Enzymes: No results for input(s): CKTOTAL, CKMB, CKMBINDEX, TROPONINI in the last 168 hours. BNP (last 3 results) No results for input(s): PROBNP in the last 8760 hours. CBG: Recent Labs  Lab 10/26/19 2023 10/27/19 0030 10/27/19 0820 10/27/19 1143 10/27/19 1535  GLUCAP 103* 101* 122* 112* 117*    No results found for this or any previous visit (from the past 240 hour(s)).    Studies: No results found.   Scheduled Meds: . aspirin  81 mg Per Tube Daily  . atorvastatin  80 mg Per Tube Daily  . carvedilol  6.25 mg Per Tube BID WC  . chlorhexidine gluconate (MEDLINE KIT)  15 mL Mouth Rinse BID  . Chlorhexidine Gluconate Cloth  6 each Topical Daily  . digoxin  0.125 mg Per Tube Daily  . enoxaparin (LOVENOX) injection  40 mg Subcutaneous Q24H  . feeding supplement (PROSource TF)  45 mL Per Tube QID  . insulin aspart  0-15 Units Subcutaneous Q4H  . mouth rinse  15 mL Mouth Rinse 10 times per day  . prenatal multivitamin  1 tablet Per Tube Q1200  . sacubitril-valsartan  1 tablet Per Tube BID  . scopolamine  1 patch Transdermal Q72H  . sodium chloride flush  10-40 mL Intracatheter Q12H  . spironolactone  25 mg Per Tube Daily   Continuous Infusions: . sodium chloride 10 mL/hr at 10/16/19 1600  . feeding supplement (OSMOLITE 1.5 CAL) 1,000 mL (10/27/19 1537)    Active Problems:   Cardiac arrest ([place])   Acute combined systolic and diastolic heart failure (HCC)   Ventricular tachycardia, polymorphic ([place])    Encounter for central line placement   Respiratory failure ([place])   Acute metabolic encephalopathy     Deny Chevez Derek Jack, [facility]  If 7PM-7AM, please contact night-coverage [email or web address] Password [email or web address] 10/27/2019, 4:10 PM    LOS: 33 days

## 2019-10-27 NOTE — Progress Notes (Addendum)
RT capped pt's trach. Pt tolerating well at this time with VSS. RN instructed on what capping means and to remove cap if pt needs suctioning.

## 2019-10-28 LAB — GLUCOSE, CAPILLARY
Glucose-Capillary: 102 mg/dL — ABNORMAL HIGH (ref 70–99)
Glucose-Capillary: 108 mg/dL — ABNORMAL HIGH (ref 70–99)
Glucose-Capillary: 109 mg/dL — ABNORMAL HIGH (ref 70–99)
Glucose-Capillary: 117 mg/dL — ABNORMAL HIGH (ref 70–99)
Glucose-Capillary: 119 mg/dL — ABNORMAL HIGH (ref 70–99)
Glucose-Capillary: 125 mg/dL — ABNORMAL HIGH (ref 70–99)
Glucose-Capillary: 95 mg/dL (ref 70–99)

## 2019-10-28 MED ORDER — LORAZEPAM 2 MG/ML IJ SOLN: 1.0000 mg | Freq: Once | INTRAMUSCULAR | Status: DC | PRN

## 2019-10-28 MED ORDER — LORAZEPAM 2 MG/ML IJ SOLN
INTRAMUSCULAR | Status: AC
  Administered 2019-10-28: 1 mg via INTRAVENOUS
  Filled 2019-10-28: qty 1

## 2019-10-28 MED ORDER — LORAZEPAM 2 MG/ML IJ SOLN: 1.0000 mg | Freq: Once | INTRAMUSCULAR | Status: AC | PRN

## 2019-10-28 NOTE — Progress Notes (Signed)
Rehab Admissions Coordinator Note:  Patient was screened by Clois Dupes for appropriateness for an Inpatient Acute Rehab Consult per therapy change in recommendation.  At this time, we are recommending Inpatient Rehab consult. I will place order per protocol.  Clois Dupes RN MSN 10/28/2019, 4:22 PM  I can be reached at [phone].

## 2019-10-28 NOTE — Progress Notes (Signed)
PROGRESS NOTE    Beverly [name]  WUJ:[id number] DOB: 1985-10-20 DOA: 09/24/2019 PCP: Patient, No Pcp Per    Brief Narrative: 34 year old female presents to the hospital 2 weeks postpartum with chest pain and in the ED sustained V. fib arrest with V. fib x3 epi x1, EKG showed ST elevation underwent Cath Lab at [facility] showed aneurysmal dilation(dissection?) of the LAD was identified. She was transferred to [facility] for ICU admission.LVEF 30% EF in cath lab.  Echo 8/18>LVEF 30-35%, septal and apical akinesis distal anterior wall an d mid/distal inferior wall hypokinesis hyperdynamic basal function. LV size mildly dilated.  Patient suffered anoxic brain injury. 8.23:MRI brain  8/30 underwent tracheostomy 8/30 PEG tube placement 9/10: Continues to be encephalopathic, able to open eyes and track but not following any commands and not talking,is moving extremities independently 9/15: Tracheostomy downsized to # 4 cufless. 9/20:Trach CAP trial, PCCM following SNF planned for 9/30- 30 days out from trach  Subjective: Nursing report patient was agitated last night did not sleep was awake for 2 days, received Ativan 1 mg earlier this morning.  This morning she was sleepy briefly opens eyes to touch but goes back to sleep.  Slightly tachycardic.  No family at bedside.  Assessment & Plan:  V. fib arrest due to anterior MI status post cardiac cath, found to have aneurysmal proximal LAD with dissection likely culprit.  Seen by cardiology,  continue with aspirin 81 mg, Lipitor 80 mg, Coreg 6.25 twice daily, Aldactone, digoxin, Entresto  Acute systolic congestive heart failure with EF 35%: 2/2 MI.  Euvolemic.  Entresto dose was cut down 24-26 after discussing with  Cards 9/12 due to soft bp.  BP remains stable.Continue on Coreg, Aldactone, digoxin.  Monitor renal function intermittently.  Acute encephalopathy/confusion, possible anoxic brain injury : Due to #1.  Had extensive work-up with  EEG, MRI brain.  Now she is trach and PEG dependent.  Now on Trach Cap trial per PCCM/RT-deferred to pulmonary. Continue supportive care, may need sleeping aid to regulate her sleep cycle.  Hypokalemia/hypomagnesemia resolved.  Monitor intermittently  Hypertension continue carvedilol Aldactone and Entresto-dose was decreased due to soft BP  History of SVT on NSR, continue Coreg  Diarrhea off Flexi-Seal.  On tube feed  ?Aspiration pneumonia completed antibiotics previously  DVT prophylaxis: enoxaparin (LOVENOX) injection 40 mg Start: 10/07/19 1000 Place and maintain sequential compression device Start: 09/24/19 0136 Code Status:   Code Status: Full Code  Family Communication: plan of care discussed with patient at bedside.  Status is: Inpatient Remains inpatient appropriate because:Inpatient level of care appropriate due to severity of illness and For ongoing management of tracheostomy as per pulmonary  Dispo: The patient is from: Home              Anticipated d/c is to: SNF              Anticipated d/c date is: > 3 days              Patient currently is not medically stable to d/c. Nutrition: Diet Order    None      Nutrition Problem: Increased nutrient needs Etiology: post-op healing Signs/Symptoms: estimated needs Interventions: Tube feeding Body mass index is 23.22 kg/m.  Consultants:see note  Procedures:see note Microbiology:see note Blood Culture    Component Value Date/Time   SDES URINE, RANDOM 10/08/2019 1316   SPECREQUEST NONE 10/08/2019 1316   CULT  10/08/2019 1316    NO GROWTH Performed at [facility], [place] [address], [place], [place] [address]    REPTSTATUS 10/09/2019 FINAL 10/08/2019 1316    Other culture-see note  Medications: Scheduled Meds: . aspirin  81 mg Per Tube Daily  . atorvastatin  80 mg Per Tube Daily  . carvedilol  6.25 mg Per Tube BID WC  . chlorhexidine gluconate (MEDLINE KIT)  15 mL Mouth Rinse BID  . Chlorhexidine Gluconate  Cloth  6 each Topical Daily  . digoxin  0.125 mg Per Tube Daily  . enoxaparin (LOVENOX) injection  40 mg Subcutaneous Q24H  . feeding supplement (PROSource TF)  45 mL Per Tube QID  . influenza vac split quadrivalent PF  0.5 mL Intramuscular Tomorrow-1000  . insulin aspart  0-15 Units Subcutaneous Q4H  . mouth rinse  15 mL Mouth Rinse 10 times per day  . prenatal multivitamin  1 tablet Per Tube Q1200  . sacubitril-valsartan  1 tablet Per Tube BID  . scopolamine  1 patch Transdermal Q72H  . sodium chloride flush  10-40 mL Intracatheter Q12H  . spironolactone  25 mg Per Tube Daily   Continuous Infusions: . sodium chloride 10 mL/hr at 10/16/19 1600  . feeding supplement (OSMOLITE 1.5 CAL) 1,000 mL (10/27/19 1537)    Antimicrobials: Anti-infectives (From admission, onward)   Start     Dose/Rate Route Frequency Ordered Stop   10/09/19 2100  vancomycin (VANCOREADY) IVPB 1500 mg/300 mL  Status:  Discontinued        1,500 mg 150 mL/hr over 120 Minutes Intravenous Every 12 hours 10/09/19 1237 10/10/19 1121   10/09/19 0800  ceFEPIme (MAXIPIME) 2 g in sodium chloride 0.9 % 100 mL IVPB        2 g 200 mL/hr over 30 Minutes Intravenous Every 8 hours 10/09/19 0752 10/15/19 2147   10/09/19 0800  vancomycin (VANCOREADY) IVPB 2000 mg/400 mL        2,000 mg 200 mL/hr over 120 Minutes Intravenous  Once 10/09/19 0752 10/09/19 1147   10/07/19 1540  ceFAZolin (ANCEF) 2-4 GM/100ML-% IVPB       Note to Pharmacy: [name] Manges   : cabinet override      10/07/19 1540 10/08/19 0344   10/07/19 1400  ceFAZolin (ANCEF) IVPB 2g/100 mL premix        2 g 200 mL/hr over 30 Minutes Intravenous To Radiology 10/06/19 1339 10/07/19 1627   09/30/19 0300  vancomycin (VANCOCIN) IVPB 1000 mg/200 mL premix  Status:  Discontinued        1,000 mg 200 mL/hr over 60 Minutes Intravenous Every 8 hours 09/29/19 1809 10/01/19 0853   09/29/19 1815  ceFEPIme (MAXIPIME) 2 g in sodium chloride 0.9 % 100 mL IVPB        2 g 200 mL/hr  over 30 Minutes Intravenous Every 8 hours 09/29/19 1803 10/05/19 1801   09/29/19 1815  vancomycin (VANCOREADY) IVPB 2000 mg/400 mL        2,000 mg 200 mL/hr over 120 Minutes Intravenous  Once 09/29/19 1803 09/29/19 2137   09/24/19 2200  cefTRIAXone (ROCEPHIN) 2 g in sodium chloride 0.9 % 100 mL IVPB        2 g 200 mL/hr over 30 Minutes Intravenous Daily at bedtime 09/24/19 1201 09/28/19 2338   09/24/19 0330  cefTRIAXone (ROCEPHIN) 1 g in sodium chloride 0.9 % 100 mL IVPB  Status:  Discontinued        1 g 200 mL/hr over 30 Minutes Intravenous Daily at bedtime 09/24/19 0245 09/24/19 1201  09/24/19 0300  azithromycin (ZITHROMAX) 500 mg in sodium chloride 0.9 % 250 mL IVPB  Status:  Discontinued        500 mg 250 mL/hr over 60 Minutes Intravenous Daily at bedtime 09/24/19 0245 09/24/19 1103     Objective: Vitals: Today's Vitals   10/28/19 0900 10/28/19 1037 10/28/19 1130 10/28/19 1156  BP:   108/73 108/73  Pulse:   (!) 106 (!) 102  Resp: $Remo'20  17 18  'Lfzeb$ Temp:   98 F (36.7 C)   TempSrc:   Oral   SpO2:   98% 97%  Weight:      Height:      PainSc:  0-No pain      Intake/Output Summary (Last 24 hours) at 10/28/2019 1234 Last data filed at 10/27/2019 1748 Gross per 24 hour  Intake 0 ml  Output --  Net 0 ml   Filed Weights   10/26/19 0418 10/27/19 0440 10/28/19 0637  Weight: 69.9 kg 73.5 kg 73.4 kg   Weight change: -0.083 kg  Intake/Output from previous day: No intake/output data recorded. Intake/Output this shift: No intake/output data recorded. Examination: General exam: Sleepy/lethargic this morning, no family at bedside.   HEENT:Oral mucosa moist, Ear/Nose WNL grossly,dentition normal. Respiratory system: bilaterally clear, trach collar+no wheezing or crackles,no use of accessory muscle, non tender. Cardiovascular system: S1 & S2 +, regular, No JVD. Gastrointestinal system: Abdomen soft, NT,ND, BS+. Nervous System: Sleepy lethargic difficult assess neuro function.  Normally  she does not follow commands.   Extremities: No edema, distal peripheral pulses palpable.  Skin: No rashes,no icterus. MSK: Normal muscle bulk,tone, power  Data Reviewed: I have personally reviewed following labs and imaging studies CBC: No results for input(s): WBC, NEUTROABS, HGB, HCT, MCV, PLT in the last 168 hours. Basic Metabolic Panel: Recent Labs  Lab 10/22/19 0423 10/23/19 0516 10/27/19 0500  NA 138 138 137  K 3.9 4.1 3.8  CL 103 102 100  CO2 $Re'26 24 26  'zjx$ GLUCOSE 125* 115* 145*  BUN $Re'15 14 14  'Ilq$ CREATININE 0.52 0.46 0.56  CALCIUM 10.0 10.0 9.9  MG  --  1.9 2.0   GFR: Estimated Creatinine Clearance: 109.2 mL/min (by C-G formula based on SCr of 0.56 mg/dL). Liver Function Tests: No results for input(s): AST, ALT, ALKPHOS, BILITOT, PROT, ALBUMIN in the last 168 hours. No results for input(s): LIPASE, AMYLASE in the last 168 hours. No results for input(s): AMMONIA in the last 168 hours. Coagulation Profile: No results for input(s): INR, PROTIME in the last 168 hours. Cardiac Enzymes: No results for input(s): CKTOTAL, CKMB, CKMBINDEX, TROPONINI in the last 168 hours. BNP (last 3 results) No results for input(s): PROBNP in the last 8760 hours. HbA1C: No results for input(s): HGBA1C in the last 72 hours. CBG: Recent Labs  Lab 10/27/19 2015 10/28/19 0200 10/28/19 0643 10/28/19 0821 10/28/19 1124  GLUCAP 110* 117* 102* 95 109*   Lipid Profile: No results for input(s): CHOL, HDL, LDLCALC, TRIG, CHOLHDL, LDLDIRECT in the last 72 hours. Thyroid Function Tests: No results for input(s): TSH, T4TOTAL, FREET4, T3FREE, THYROIDAB in the last 72 hours. Anemia Panel: No results for input(s): VITAMINB12, FOLATE, FERRITIN, TIBC, IRON, RETICCTPCT in the last 72 hours. Sepsis Labs: No results for input(s): PROCALCITON, LATICACIDVEN in the last 168 hours.  No results found for this or any previous visit (from the past 240 hour(s)).   Radiology Studies: No results found.   LOS:  34 days   Antonieta Pert, MD [facility]  10/28/2019, 12:34  PM

## 2019-10-28 NOTE — Progress Notes (Signed)
Physical Therapy Treatment Patient Details Name: Beverly [name] MRN: [id number] DOB: [birth year] Today's Date: 10/28/2019    History of Present Illness Pt is 34 yo admitted 09/23/19 with VF arrest. Pt was 2 weeks post partum after emergent C section due to rupture of membranes with . Pt with enterobacter PNA, trach 8/30, PEG, encephalopathy. PMHx: HTN    PT Comments    Pt making excellent progress and was able to get OOB today with the [facility]. Lower extremities are definitely stronger and have better control than UE's and trunk. Believe pt is making enough progress to ask CIR to look at her to see if they feel she would be appropriate for CIR.    Follow Up Recommendations  CIR     Equipment Recommendations  Other (comment) (To be determined at next venue)    Recommendations for Other Services       Precautions / Restrictions Precautions Precautions: Fall Precaution Comments: trach, peg, incontinence    Mobility  Bed Mobility Overal bed mobility: Needs Assistance Bed Mobility: Supine to Sit     Supine to sit: +2 for physical assistance;Mod assist     General bed mobility comments: Assist to elevate trunk into sitting. Pt able to bring LE's off of the bed  Transfers Overall transfer level: Needs assistance   Transfers: Sit to/from Stand Sit to Stand: +2 physical assistance;Max assist         General transfer comment: Assist to bring hips up and for trunk control. [name] UE's too weak to hold onto [place]. Used Stedy for bed to recliner. Mod to max assist to control trunk in [place]. Pt pushing into extension at times.   Ambulation/Gait                 Stairs             Wheelchair Mobility    Modified Rankin (Stroke Patients Only)       Balance Overall balance assessment: Needs assistance Sitting-balance support: Bilateral upper extremity supported;Feet supported Sitting balance-Leahy Scale: Poor Sitting balance - Comments: Pt sat EOB for 6-7  minutes with min guard to max assist. At times pushing into extension requiring max assist to correct trunk and at other times could sit with min guard   Standing balance support: No upper extremity supported Standing balance-Leahy Scale: Zero Standing balance comment: Stood in Stedy x 30 seconds. At times upright with mod assist but at other times +2 max                            Cognition Arousal/Alertness: Awake/alert Behavior During Therapy: Flat affect (slight smile at times) Overall Cognitive Status: Impaired/Different from baseline                                 General Comments: Pt with capped trach and did try to whisper 1 or 2 times to questions. Followed some 1 step commands with tactile cueing. Good eye contact.       Exercises      General Comments        Pertinent Vitals/Pain Pain Assessment: Faces Faces Pain Scale: No hurt    Home Living                      Prior Function            PT Goals (current  goals can now be found in the care plan section) Acute Rehab PT Goals Patient Stated Goal: unable to state Progress towards PT goals: Progressing toward goals    Frequency    Min 3X/week      PT Plan Discharge plan needs to be updated;Frequency needs to be updated    Co-evaluation              AM-PAC PT "6 Clicks" Mobility   Outcome Measure  Help needed turning from your back to your side while in a flat bed without using bedrails?: A Lot Help needed moving from lying on your back to sitting on the side of a flat bed without using bedrails?: A Lot Help needed moving to and from a bed to a chair (including a wheelchair)?: Total Help needed standing up from a chair using your arms (e.g., wheelchair or bedside chair)?: Total Help needed to walk in hospital room?: Total Help needed climbing 3-5 steps with a railing? : Total 6 Click Score: 8    End of Session   Activity Tolerance: Patient tolerated  treatment well Patient left: with call bell/phone within reach;in chair;with family/visitor present;Other (comment) (lap belt for positioning) Nurse Communication: Mobility status;Need for lift equipment (Maximove or Stedy) PT Visit Diagnosis: Other abnormalities of gait and mobility (R26.89);Difficulty in walking, not elsewhere classified (R26.2);Other symptoms and signs involving the nervous system (R29.898)     Time: 1343-1411 PT Time Calculation (min) (ACUTE ONLY): 28 min  Charges:  $Therapeutic Activity: 23-37 mins                     [facility] PT Acute Rehabilitation Services Pager [phone] Office [phone]    [name] [facility] 10/28/2019, 3:46 PM

## 2019-10-28 NOTE — Evaluation (Signed)
Clinical/Bedside Swallow Evaluation Patient Details  Name: Beverly [name] MRN: [id number] Date of Birth: 12/10/1985  Today's Date: 10/28/2019 Time: SLP Start Time (ACUTE ONLY): 1230 SLP Stop Time (ACUTE ONLY): 1254 SLP Time Calculation (min) (ACUTE ONLY): 24 min  Past Medical History:  Past Medical History:  Diagnosis Date  . Hypertension    last pregnancy  . Vaginal Pap smear, abnormal    when she was 34yo   Past Surgical History:  Past Surgical History:  Procedure Laterality Date  . IR GASTROSTOMY TUBE MOD SED  10/07/2019  . LEFT HEART CATH AND CORONARY ANGIOGRAPHY N/A 09/23/2019   Procedure: LEFT HEART CATH AND CORONARY ANGIOGRAPHY;  Surgeon: Yvonne Kendall, MD;  Location: [facility] INVASIVE CV LAB;  Service: Cardiovascular;  Laterality: N/A;   HPI:  Pt is a 34 y.o. female with PMH of HTN who was admitted 09/23/19 2 weeks post partum after emergent C section due to rupture of membranes with VF arrest. Pt with enterobacter PNA and encephalopathy, trach placed 8/30 and PEG on 8/31. MRI 8/31: No evidence of anoxic brain injury. Pt changed to #4 cuffless trach on 9/15; capped on 9/21.    Assessment / Plan / Recommendation Clinical Impression  Pt ready for clinical swallow assessment today given improvements in alertness and ability to follow some commands.  Janina Mayo is now capped.  Pt presented with intermittent vocalizations.  She demonstrated improved attention to the left.  RN assisted with repositioning to optimize participation.  Pt demonstrated recognition of approaching spoon/ straw with appropriate oral motor response.  She masticated ice chips and pursed lips around straw with good seal, no spillage.  There was a palpable swallow response and there were no overt s/s of aspiration over multiple trials.  Pt vocalized and smiled intermittently.  Her mental status and ability to participate suggests readiness for instrumental swallow study.  D/W RN and pt's mother. Will schedule for next date.   SLP Visit Diagnosis: Dysphagia, unspecified (R13.10)    Aspiration Risk    tba   Diet Recommendation   NPO pending MBS       Other  Recommendations Oral Care Recommendations: Oral care QID   Follow up Recommendations          Swallow Study   General HPI: Pt is a 34 y.o. female with PMH of HTN who was admitted 09/23/19 2 weeks post partum after emergent C section due to rupture of membranes with VF arrest. Pt with enterobacter PNA and encephalopathy, trach placed 8/30 and PEG on 8/31. MRI 8/31: No evidence of anoxic brain injury. Pt changed to #4 cuffless trach on 9/15; capped on 9/21.  Type of Study: Bedside Swallow Evaluation Previous Swallow Assessment: no Diet Prior to this Study: NPO;PEG tube Temperature Spikes Noted: No Respiratory Status: Room air History of Recent Intubation: No Behavior/Cognition: Alert Oral Cavity Assessment: Within Functional Limits Oral Care Completed by SLP: Recent completion by staff Oral Cavity - Dentition: Adequate natural dentition Self-Feeding Abilities: Total assist Patient Positioning: Upright in bed Baseline Vocal Quality: Low vocal intensity Volitional Cough: Cognitively unable to elicit Volitional Swallow: Unable to elicit    Oral/Motor/Sensory Function Overall Oral Motor/Sensory Function:  (unable to follow oral-motor commands)   Ice Chips Ice chips: Within functional limits   Thin Liquid Thin Liquid: Within functional limits    Nectar Thick Nectar Thick Liquid: Not tested   Honey Thick Honey Thick Liquid: Not tested   Puree Puree: Not tested   Solid     Solid: Not  tested      Blenda Mounts Laurice 10/28/2019,12:59 PM   Marchelle Folks L. Samson Frederic, MA CCC/SLP Acute Rehabilitation Services Office number [phone] Pager [phone]

## 2019-10-28 NOTE — Progress Notes (Signed)
Nutrition Follow-up  DOCUMENTATION CODES:   Not applicable  INTERVENTION:   Plan MBS tomorrow. If passes, anticipate transition to nocturnal feeding to allow increased PO intake throughout the day.   Continue tube feeding: -Osmolite 1.5 @ 60  ml/hr via PEG (1440 ml) -45 ml ProSource QID  Provides: 2320 kcals, 134 grams protein, 1097 ml free water.   NUTRITION DIAGNOSIS:   Increased nutrient needs related to post-op healing as evidenced by estimated needs.  Ongoing  GOAL:   Patient will meet greater than or equal to 90% of their needs  Addressed via TF  MONITOR:   Vent status, Skin, TF tolerance, I & O's, Labs, Weight trends  REASON FOR ASSESSMENT:   Ventilator    ASSESSMENT:   Patient with PMH significant for HTN and 2 weeks post partum emergency C-section. Presents this admission with new onset peripartum cardiomyopathy with VF arrest.   8/30- trach, PEG 9/4- on ATC  Sleepy upon RD visit. Doing well with trach capping trials. Tolerating tube feeding at goal rate. Plan MBS tomorrow. Ancipitate transition to nocturnal feedings if passes. Would not d/c tube feeding until pt demonstrates consistent PO intake (>/=75%).   Admission weight: 103.1 kg  Current weight: 73.4 kg (question validity?)  Medications: SS novolog, prenatal MVI, aldactone Labs: CBG 95-122  Diet Order:   Diet Order    None      EDUCATION NEEDS:   Not appropriate for education at this time  Skin:  Skin Assessment: Reviewed RN Assessment  Last BM:  9/20  Height:   Ht Readings from Last 1 Encounters:  09/24/19 5\' 10"  (1.778 m)    Weight:   Wt Readings from Last 1 Encounters:  10/28/19 73.4 kg    BMI:  Body mass index is 23.22 kg/m.  Estimated Nutritional Needs:   Kcal:  2150-2350 kcal  Protein:  115-130 grams  Fluid:  >/= 2 L/day   10/30/19 RD, LDN Clinical Nutrition Pager listed in AMION

## 2019-10-29 ENCOUNTER — Inpatient Hospital Stay (HOSPITAL_COMMUNITY): Payer: Medicaid Other

## 2019-10-29 LAB — CBC
HCT: 36.1 % (ref 36.0–46.0)
Hemoglobin: 11.1 g/dL — ABNORMAL LOW (ref 12.0–15.0)
MCH: 25.9 pg — ABNORMAL LOW (ref 26.0–34.0)
MCHC: 30.7 g/dL (ref 30.0–36.0)
MCV: 84.1 fL (ref 80.0–100.0)
Platelets: 604 10*3/uL — ABNORMAL HIGH (ref 150–400)
RBC: 4.29 MIL/uL (ref 3.87–5.11)
RDW: 17.8 % — ABNORMAL HIGH (ref 11.5–15.5)
WBC: 8.8 10*3/uL (ref 4.0–10.5)
nRBC: 0 % (ref 0.0–0.2)

## 2019-10-29 LAB — GLUCOSE, CAPILLARY
Glucose-Capillary: 136 mg/dL — ABNORMAL HIGH (ref 70–99)
Glucose-Capillary: 122 mg/dL — ABNORMAL HIGH (ref 70–99)
Glucose-Capillary: 106 mg/dL — ABNORMAL HIGH (ref 70–99)
Glucose-Capillary: 147 mg/dL — ABNORMAL HIGH (ref 70–99)
Glucose-Capillary: 107 mg/dL — ABNORMAL HIGH (ref 70–99)
Glucose-Capillary: 109 mg/dL — ABNORMAL HIGH (ref 70–99)

## 2019-10-29 LAB — COMPREHENSIVE METABOLIC PANEL
ALT: 84 U/L — ABNORMAL HIGH (ref 0–44)
AST: 47 U/L — ABNORMAL HIGH (ref 15–41)
Albumin: 3.4 g/dL — ABNORMAL LOW (ref 3.5–5.0)
Alkaline Phosphatase: 129 U/L — ABNORMAL HIGH (ref 38–126)
Anion gap: 8 (ref 5–15)
BUN: 19 mg/dL (ref 6–20)
CO2: 26 mmol/L (ref 22–32)
Calcium: 9.8 mg/dL (ref 8.9–10.3)
Chloride: 103 mmol/L (ref 98–111)
Creatinine, Ser: 0.56 mg/dL (ref 0.44–1.00)
GFR calc Af Amer: >60 mL/min (ref >60)
GFR calc non Af Amer: >60 mL/min (ref >60)
Glucose, Bld: 148 mg/dL — ABNORMAL HIGH (ref 70–99)
Potassium: 4 mmol/L (ref 3.5–5.1)
Sodium: 137 mmol/L (ref 135–145)
Total Bilirubin: 0.5 mg/dL (ref 0.3–1.2)
Total Protein: 8 g/dL (ref 6.5–8.1)

## 2019-10-29 LAB — MAGNESIUM: Magnesium: 2 mg/dL (ref 1.7–2.4)

## 2019-10-29 MED ORDER — ALTEPLASE 2 MG IJ SOLR
2.0000 mg | Freq: Once | INTRAMUSCULAR | Status: AC
  Administered 2019-10-29: 2 mg

## 2019-10-29 MED ORDER — ALPRAZOLAM 0.25 MG PO TABS
0.2500 mg | ORAL_TABLET | Freq: Every evening | ORAL | Status: AC | PRN
  Administered 2019-10-29: 0.25 mg via ORAL
  Filled 2019-10-29: qty 1

## 2019-10-29 MED ORDER — MELATONIN 3 MG PO TABS
3.0000 mg | ORAL_TABLET | Freq: Every day | ORAL | Status: DC
  Administered 2019-10-29 – 2019-11-04 (×7): 3 mg via ORAL
  Filled 2019-10-29 (×7): qty 1

## 2019-10-29 NOTE — Progress Notes (Signed)
PROGRESS NOTE    Beverly [name]  HWE:[id number] DOB: 03/25/1985 DOA: 09/24/2019 PCP: Patient, No Pcp Per    Brief Narrative: 34 year old female presents to the hospital 2 weeks postpartum with chest pain and in the ED sustained V. fib arrest with V. fib x3 epi x1, EKG showed ST elevation underwent Cath Lab at [facility] showed aneurysmal dilation(dissection?) of the LAD was identified. She was transferred to [facility] for ICU admission.LVEF 30% EF in cath lab.  Echo 8/18>LVEF 30-35%, septal and apical akinesis distal anterior wall an d mid/distal inferior wall hypokinesis hyperdynamic basal function. LV size mildly dilated.  Patient suffered anoxic brain injury. 8.23:MRI brain  8/30 underwent tracheostomy 8/30 PEG tube placement 9/10: Continues to be encephalopathic, able to open eyes and track but not following any commands and not talking,is moving extremities independently 9/15: Tracheostomy downsized to # 4 cufless. 9/20:Trach CAP trial, PCCM following SNF planned for 9/30- 30 days out from trach 9/21:Patient is showing improvement with the speech therapy and PT-recommendation changed from SNF to inpatient rehab.   Subjective: Seen and examined.  Mother at the bedside.  Did not sleep well last night currently sleeping did wake up briefly but goes back to sleep. Mother hesitant to initiate any new medication but would like to try melatonin tonight Patient has been tachycardic in 100-130s, afebrile, BP stable MBS planned today per SLP  Assessment & Plan:  V. fib arrest due to anterior MI status post cardiac cath, found to have aneurysmal proximal LAD with dissection likely culprit.  Cardiology following monitoring perioperatively.  Continue on aspirin 81 mg, Lipitor 80 mg, Coreg 6.25 bid. Cont  Aldactone, digoxin, Entresto  Acute systolic congestive heart failure with EF 35%: 2/2 MI.  Euvolemic.  Entresto dose was cut down 24-26 after discussing with  Cards 9/12 due to soft  bp.  BP remains stable.Continue on Coreg, Aldactone, digoxin.   Due to renal function intermittently.  Intermittent tachycardia noted.  Acute encephalopathy/confusion, possible anoxic brain injury: Due to #1.  Had extensive work-up with EEG, MRI brain.  Patient has been trach and PEG dependent.  Now she is more alert awake responding some, speech planning for MBS today.  Trach capping trail started by [facility] 9/21.   Hypokalemia/hypomagnesemia labs stable.  Hypertension: BP stable soft in the low 100.  Continue carvedilol Aldactone and Entresto-dose was decreased due to soft BP  History of SVT on NSR, continue Coreg.  Diarrhea off Flexi-Seal.  ?Aspiration pneumonia completed antibiotics previously  DVT prophylaxis: enoxaparin (LOVENOX) injection 40 mg Start: 10/07/19 1000 Place and maintain sequential compression device Start: 09/24/19 0136 Code Status:   Code Status: Full Code  Family Communication: plan of care discussed with patient/s mother at the bedside.  Status is: Inpatient Remains inpatient appropriate because:Inpatient level of care appropriate due to severity of illness and For ongoing management of tracheostomy as per pulmonary  Dispo: The patient is from: Home              Anticipated d/c is to: CIR consulted 9/21              Anticipated d/c date is: > 3 days              Patient currently is not medically stable to d/c. Nutrition: Diet Order    None      Nutrition Problem: Increased nutrient needs Etiology: post-op healing Signs/Symptoms: estimated needs Interventions: Tube feeding Body mass index is 23.24 kg/m.  Consultants:see note  Procedures:see  note Microbiology:see note Blood Culture    Component Value Date/Time   SDES URINE, RANDOM 10/08/2019 1316   SPECREQUEST NONE 10/08/2019 1316   CULT  10/08/2019 1316    NO GROWTH Performed at [facility], [place] [address], [place], [place] [address]    REPTSTATUS 10/09/2019 FINAL 10/08/2019 1316      Other culture-see note  Medications: Scheduled Meds: . aspirin  81 mg Per Tube Daily  . atorvastatin  80 mg Per Tube Daily  . carvedilol  6.25 mg Per Tube BID WC  . chlorhexidine gluconate (MEDLINE KIT)  15 mL Mouth Rinse BID  . Chlorhexidine Gluconate Cloth  6 each Topical Daily  . digoxin  0.125 mg Per Tube Daily  . enoxaparin (LOVENOX) injection  40 mg Subcutaneous Q24H  . feeding supplement (PROSource TF)  45 mL Per Tube QID  . influenza vac split quadrivalent PF  0.5 mL Intramuscular Tomorrow-1000  . insulin aspart  0-15 Units Subcutaneous Q4H  . mouth rinse  15 mL Mouth Rinse 10 times per day  . prenatal multivitamin  1 tablet Per Tube Q1200  . sacubitril-valsartan  1 tablet Per Tube BID  . scopolamine  1 patch Transdermal Q72H  . sodium chloride flush  10-40 mL Intracatheter Q12H  . spironolactone  25 mg Per Tube Daily   Continuous Infusions: . sodium chloride 10 mL/hr at 10/16/19 1600  . feeding supplement (OSMOLITE 1.5 CAL) 1,000 mL (10/27/19 1537)    Antimicrobials: Anti-infectives (From admission, onward)   Start     Dose/Rate Route Frequency Ordered Stop   10/09/19 2100  vancomycin (VANCOREADY) IVPB 1500 mg/300 mL  Status:  Discontinued        1,500 mg 150 mL/hr over 120 Minutes Intravenous Every 12 hours 10/09/19 1237 10/10/19 1121   10/09/19 0800  ceFEPIme (MAXIPIME) 2 g in sodium chloride 0.9 % 100 mL IVPB        2 g 200 mL/hr over 30 Minutes Intravenous Every 8 hours 10/09/19 0752 10/15/19 2147   10/09/19 0800  vancomycin (VANCOREADY) IVPB 2000 mg/400 mL        2,000 mg 200 mL/hr over 120 Minutes Intravenous  Once 10/09/19 0752 10/09/19 1147   10/07/19 1540  ceFAZolin (ANCEF) 2-4 GM/100ML-% IVPB       Note to Pharmacy: [name] Manges   : cabinet override      10/07/19 1540 10/08/19 0344   10/07/19 1400  ceFAZolin (ANCEF) IVPB 2g/100 mL premix        2 g 200 mL/hr over 30 Minutes Intravenous To Radiology 10/06/19 1339 10/07/19 1627   09/30/19 0300   vancomycin (VANCOCIN) IVPB 1000 mg/200 mL premix  Status:  Discontinued        1,000 mg 200 mL/hr over 60 Minutes Intravenous Every 8 hours 09/29/19 1809 10/01/19 0853   09/29/19 1815  ceFEPIme (MAXIPIME) 2 g in sodium chloride 0.9 % 100 mL IVPB        2 g 200 mL/hr over 30 Minutes Intravenous Every 8 hours 09/29/19 1803 10/05/19 1801   09/29/19 1815  vancomycin (VANCOREADY) IVPB 2000 mg/400 mL        2,000 mg 200 mL/hr over 120 Minutes Intravenous  Once 09/29/19 1803 09/29/19 2137   09/24/19 2200  cefTRIAXone (ROCEPHIN) 2 g in sodium chloride 0.9 % 100 mL IVPB        2 g 200 mL/hr over 30 Minutes Intravenous Daily at bedtime 09/24/19 1201 09/28/19 2338   09/24/19 0330  cefTRIAXone (ROCEPHIN) 1 g in sodium chloride 0.9 % 100 mL IVPB  Status:  Discontinued        1 g 200 mL/hr over 30 Minutes Intravenous Daily at bedtime 09/24/19 0245 09/24/19 1201   09/24/19 0300  azithromycin (ZITHROMAX) 500 mg in sodium chloride 0.9 % 250 mL IVPB  Status:  Discontinued        500 mg 250 mL/hr over 60 Minutes Intravenous Daily at bedtime 09/24/19 0245 09/24/19 1103     Objective: Vitals: Today's Vitals   10/29/19 0119 10/29/19 0320 10/29/19 0357 10/29/19 0358  BP:  108/79 108/79   Pulse: (!) 122 (!) 106 (!) 135 (!) 136  Resp: 18 19 (!) 30 20  Temp:  97.9 F (36.6 C)    TempSrc:  Oral    SpO2: 94% 96% 95% 98%  Weight:  73.5 kg    Height:      PainSc:       No intake or output data in the 24 hours ending 10/29/19 0758 Filed Weights   10/27/19 0440 10/28/19 0637 10/29/19 0320  Weight: 73.5 kg 73.4 kg 73.5 kg   Weight change: 0.083 kg  Intake/Output from previous day: No intake/output data recorded. Intake/Output this shift: No intake/output data recorded.  Examination: General exam: Sleeping woke up on calling smiles but goes back to sleep.   HEENT:Oral mucosa moist, Ear/Nose WNL grossly, dentition normal. Respiratory system: bilaterally clear, trach+ no wheezing or crackles,no use of  accessory muscle Cardiovascular system: S1 & S2 +, No JVD,. Gastrointestinal system: Abdomen soft, PEG+ NT,ND, BS+ Nervous System: Sleeping able to wake up but goes back to sleep soon.   Extremities: No edema, distal peripheral pulses palpable.  Skin: No rashes,no icterus. MSK: Normal muscle bulk,tone, power.  Data Reviewed: I have personally reviewed following labs and imaging studies CBC: Recent Labs  Lab 10/29/19 0345  WBC 8.8  HGB 11.1*  HCT 36.1  MCV 84.1  PLT 235*   Basic Metabolic Panel: Recent Labs  Lab 10/23/19 0516 10/27/19 0500 10/29/19 0345  NA 138 137 137  K 4.1 3.8 4.0  CL 102 100 103  CO2 24 26 26   GLUCOSE 115* 145* 148*  BUN 14 14 19   CREATININE 0.46 0.56 0.56  CALCIUM 10.0 9.9 9.8  MG 1.9 2.0 2.0   GFR: Estimated Creatinine Clearance: 109.2 mL/min (by C-G formula based on SCr of 0.56 mg/dL). Liver Function Tests: Recent Labs  Lab 10/29/19 0345  AST 47*  ALT 84*  ALKPHOS 129*  BILITOT 0.5  PROT 8.0  ALBUMIN 3.4*   No results for input(s): LIPASE, AMYLASE in the last 168 hours. No results for input(s): AMMONIA in the last 168 hours. Coagulation Profile: No results for input(s): INR, PROTIME in the last 168 hours. Cardiac Enzymes: No results for input(s): CKTOTAL, CKMB, CKMBINDEX, TROPONINI in the last 168 hours. BNP (last 3 results) No results for input(s): PROBNP in the last 8760 hours. HbA1C: No results for input(s): HGBA1C in the last 72 hours. CBG: Recent Labs  Lab 10/28/19 1124 10/28/19 1554 10/28/19 2003 10/28/19 2350 10/29/19 0322  GLUCAP 109* 108* 125* 119* 136*   Lipid Profile: No results for input(s): CHOL, HDL, LDLCALC, TRIG, CHOLHDL, LDLDIRECT in the last 72 hours. Thyroid Function Tests: No results for input(s): TSH, T4TOTAL, FREET4, T3FREE, THYROIDAB in the last 72 hours. Anemia Panel: No results for input(s): VITAMINB12, FOLATE, FERRITIN, TIBC, IRON, RETICCTPCT in the last 72 hours. Sepsis Labs: No results for  input(s): PROCALCITON, LATICACIDVEN  in the last 168 hours.  No results found for this or any previous visit (from the past 240 hour(s)).   Radiology Studies: No results found.   LOS: 35 days   Antonieta Pert, MD [facility]  10/29/2019, 7:58 AM

## 2019-10-29 NOTE — Progress Notes (Signed)
Occupational Therapy Treatment Patient Details Name: Beverly [name] MRN: [id number] DOB: 1985/09/11 Today's Date: 10/29/2019    History of present illness Pt is 34 yo admitted 09/23/19 with VF arrest. Pt was 2 weeks post partum after emergent C section due to rupture of membranes with . Pt with enterobacter PNA, trach 8/30, PEG, encephalopathy. PMHx: HTN   OT comments  Pt quite sleepy today. Per RN, pt was up all night and had just gone to sleep at 6 am. [name] mom at bedside. Pt with flexor posturing of UEs, instructed mom in facilitating elbow extension and to avoid stimulating UE flex. Also addressed mom's request for a squeeze ball.  Updated d/c to inpatient rehab, but [facility] does not accept pt's insurance, so would need to go to another center.   Follow Up Recommendations   (post acute intensive rehab)    Equipment Recommendations  Other (comment) (defer to next venue)    Recommendations for Other Services      Precautions / Restrictions Precautions Precautions: Fall Precaution Comments: peg, incontinence       Mobility Bed Mobility Overal bed mobility: Needs Assistance             General bed mobility comments: +2 total assist to position in bed  Transfers                 General transfer comment: deferred due to lethargy    Balance                                           ADL either performed or assessed with clinical judgement   ADL                                         General ADL Comments: Total assist     Vision   Additional Comments: pt looking toward auditory stimulation   Perception     Praxis      Cognition Arousal/Alertness: Lethargic Behavior During Therapy: Flat affect Overall Cognitive Status: Impaired/Different from baseline                                 General Comments: pt not following commands this visit, per RN pt was up all night, went to sleep at 6am         Exercises General Exercises - Upper Extremity Shoulder Flexion: PROM;Both;10 reps;Supine Shoulder Horizontal ABduction: PROM;Both;10 reps Shoulder Horizontal ADduction: PROM;Both;10 reps Elbow Flexion: PROM;Both;10 reps;Supine Elbow Extension: PROM;Both;10 reps;Seated Wrist Flexion: PROM;10 reps;Both;Supine Wrist Extension: PROM;10 reps;Both;Supine Digit Composite Flexion: PROM;Both;5 reps;Supine Composite Extension: PROM;Both;5 reps;Supine   Shoulder Instructions       General Comments      Pertinent Vitals/ Pain       Pain Assessment: Faces Faces Pain Scale: No hurt  Home Living                                          Prior Functioning/Environment              Frequency  Min 2X/week        Progress Toward Goals  OT Goals(current  goals can now be found in the care plan section)  Progress towards OT goals: Progressing toward goals  Acute Rehab OT Goals Patient Stated Goal: unable to state OT Goal Formulation: Patient unable to participate in goal setting Time For Goal Achievement: 11/04/19 Potential to Achieve Goals: Fair  Plan Discharge plan needs to be updated    Co-evaluation                 AM-PAC OT "6 Clicks" Daily Activity     Outcome Measure   Help from another person eating meals?: Total Help from another person taking care of personal grooming?: Total Help from another person toileting, which includes using toliet, bedpan, or urinal?: Total Help from another person bathing (including washing, rinsing, drying)?: Total Help from another person to put on and taking off regular upper body clothing?: Total Help from another person to put on and taking off regular lower body clothing?: Total 6 Click Score: 6    End of Session    OT Visit Diagnosis: Other symptoms and signs involving cognitive function;Muscle weakness (generalized) (M62.81)   Activity Tolerance Patient limited by lethargy   Patient Left in bed;with  call bell/phone within reach;with bed alarm set;with family/visitor present   Nurse Communication Other (comment) (pt is not medicated, went to sleep at 6am)        Time: 3335-4562 OT Time Calculation (min): 12 min  Charges: OT General Charges $OT Visit: 1 Visit OT Treatments $Neuromuscular Re-education: 8-22 mins  Martie Round, OTR/L Acute Rehabilitation Services Pager: [phone] Office: [phone]   Evern Bio 10/29/2019, 11:26 AM

## 2019-10-29 NOTE — Progress Notes (Signed)
Inpatient Rehab Admissions Coordinator:   I met with pt.'s mother at bedside to discuss potential admission to CIR. She is interested in inpatient rehab; however, [facility] does not have a contract with Medicaid [employer] [facility] [place]. I will reach out to case manager to request looking into other IRF facilities that accept Pt.'s insurance. Pt.'s mother expressed an interest in [facility] IRF if available, as it is closer for there than [facility]  or [facility] in [place].  [facility] will sign off at this time.   Clemens Catholic, [place], [place] Admissions Coordinator  [phone] ([place]) [phone] (office)

## 2019-10-29 NOTE — Progress Notes (Signed)
  Speech Language Pathology Treatment: Dysphagia  Patient Details Name: Beverly [name] MRN: [id number] DOB: 1985-11-12 Today's Date: 10/29/2019 Time: 2863-8177 SLP Time Calculation (min) (ACUTE ONLY): 17 min  Assessment / Plan / Recommendation Clinical Impression  Pt was seen for dysphagia treatment. She was alert and cooperative. She produced her name and short phrases with encouragement. Pt was repositioned for the session but quickly became ill positioned soon thereafter. No s/sx of aspiration were noted with any solids or liquids. Oral holding was not observed with liquids via straw. Prolonged mastication with incisors only was noted with dysphagia 2 solids but pt was able to swallow these without significant oral residue. It is recommended that the current diet be continued and SLP will continue to follow pt.    HPI HPI: Pt is a 34 y.o. female with PMH of HTN who was admitted 09/23/19 2 weeks post partum after emergent C section due to rupture of membranes with VF arrest. Pt with enterobacter PNA and encephalopathy, trach placed 8/30 and PEG on 8/31. MRI 8/31: No evidence of anoxic brain injury. Pt changed to #4 cuffless trach on 9/15; capped on 9/21.       SLP Plan  Continue with current plan of care       Recommendations  Diet recommendations: Thin liquid Liquids provided via: Cup;Straw Medication Administration: Via alternative means Supervision: Staff to assist with self feeding;Full supervision/cueing for compensatory strategies Compensations: Slow rate;Small sips/bites Postural Changes and/or Swallow Maneuvers: Seated upright 90 degrees                Oral Care Recommendations: Oral care BID Follow up Recommendations: Inpatient Rehab SLP Visit Diagnosis: Dysphagia, oral phase (R13.11) Plan: Continue with current plan of care       Judee Hennick I. Vear Clock, MS, CCC-SLP Acute Rehabilitation Services Office number [phone] Pager [phone]                 [name] 10/29/2019, 5:16 PM

## 2019-10-29 NOTE — Progress Notes (Signed)
Modified Barium Swallow Progress Note  Patient Details  Name: VICIE CECH MRN: [id number] Date of Birth: [birth year]  Today's Date: 10/29/2019  Modified Barium Swallow completed.  Full report located under Chart Review in the Imaging Section.  Brief recommendations include the following:  Clinical Impression  Pt presents with oral dysphagia characterized by impaired posterior propulsion, impaired mastication, lingual pumping, and oral holding. Verbal and tactile cueing were intermittently effective in reducing length of bolus holding and facilitating deglutition. However, suctioning was required once and regular texture solids were removed from the pt's oral cavity following limited mastication with her incisors without any further bolus manipulation. [name] pharyngeal swallow was within normal limits without pharyngeal residue or any instances of laryngeal invasion. [name] impairments are likely cognitively based; however, the impact of the flavor of boluses on her performance is also considered. It is recommended that a clear liquid diet be initiated at this time and SLP will follow for dysphagia treatment.    Swallow Evaluation Recommendations       SLP Diet Recommendations: Thin liquid   Liquid Administration via: Cup;Straw   Medication Administration: Via alternative means           Postural Changes: Seated upright at 90 degrees   Oral Care Recommendations: Oral care BID       Aviona Martenson I. Vear Clock, MS, CCC-SLP Acute Rehabilitation Services Office number [phone] Pager [phone]  [name] 10/29/2019,11:36 AM

## 2019-10-29 NOTE — Progress Notes (Signed)
MD notified of increasing anxiety.  Pt rolling over on to stomach and swinging legs over bed rails.  Pt wrapping tube feeding tube around arm and pulling.  Temporarily holding tube feeding until calmer.  Pt then noted with arm stuck between bed rails.  Arm noted intact, ROM and no s/s of pain.   Verbal order for xanax prn 0.25 x 1 dose tonight and if needed try telesitter or call MD for 1:1 sitter.

## 2019-10-30 LAB — GLUCOSE, CAPILLARY
Glucose-Capillary: 90 mg/dL (ref 70–99)
Glucose-Capillary: 96 mg/dL (ref 70–99)
Glucose-Capillary: 97 mg/dL (ref 70–99)
Glucose-Capillary: 96 mg/dL (ref 70–99)
Glucose-Capillary: 82 mg/dL (ref 70–99)
Glucose-Capillary: 134 mg/dL — ABNORMAL HIGH (ref 70–99)

## 2019-10-30 MED ORDER — OSMOLITE 1.5 CAL PO LIQD
237.0000 mL | Freq: Every day | ORAL | Status: DC
  Administered 2019-10-30 – 2019-11-12 (×70): 237 mL
  Filled 2019-10-30 (×83): qty 237

## 2019-10-30 MED ORDER — OXCARBAZEPINE 150 MG PO TABS
75.0000 mg | ORAL_TABLET | Freq: Two times a day (BID) | ORAL | Status: DC
  Filled 2019-10-30 (×7): qty 0.5

## 2019-10-30 MED ORDER — PROSOURCE TF PO LIQD
45.0000 mL | Freq: Three times a day (TID) | ORAL | Status: DC
  Administered 2019-10-30 – 2019-11-18 (×54): 45 mL
  Filled 2019-10-30 (×58): qty 45

## 2019-10-30 MED ORDER — ALPRAZOLAM 0.5 MG PO TABS
0.5000 mg | ORAL_TABLET | Freq: Every day | ORAL | Status: AC
  Administered 2019-10-30: 0.5 mg via ORAL
  Filled 2019-10-30: qty 1

## 2019-10-30 NOTE — Progress Notes (Signed)
ADVANCED HF PROGRESS  Patient Name: Beverly [name] Date of Encounter: 10/30/2019  [facility] Cardiologist: No primary care provider on file.   Subjective   In bed. Restless. Will open eyes but not track reliably or follow commands for me. According to speech and PT she is making progress  Inpatient Medications    Scheduled Meds: . ALPRAZolam  0.5 mg Oral QHS  . aspirin  81 mg Per Tube Daily  . atorvastatin  80 mg Per Tube Daily  . carvedilol  6.25 mg Per Tube BID WC  . chlorhexidine gluconate (MEDLINE KIT)  15 mL Mouth Rinse BID  . Chlorhexidine Gluconate Cloth  6 each Topical Daily  . digoxin  0.125 mg Per Tube Daily  . enoxaparin (LOVENOX) injection  40 mg Subcutaneous Q24H  . feeding supplement (OSMOLITE 1.5 CAL)  237 mL Per Tube 6 X Daily  . feeding supplement (PROSource TF)  45 mL Per Tube TID  . influenza vac split quadrivalent PF  0.5 mL Intramuscular Tomorrow-1000  . insulin aspart  0-15 Units Subcutaneous Q4H  . mouth rinse  15 mL Mouth Rinse 10 times per day  . melatonin  3 mg Oral QHS  . OXcarbazepine  75 mg Oral BID  . prenatal multivitamin  1 tablet Per Tube Q1200  . sacubitril-valsartan  1 tablet Per Tube BID  . scopolamine  1 patch Transdermal Q72H  . sodium chloride flush  10-40 mL Intracatheter Q12H  . spironolactone  25 mg Per Tube Daily   Continuous Infusions: . sodium chloride 10 mL/hr at 10/16/19 1600   PRN Meds: acetaminophen (TYLENOL) oral liquid 160 mg/5 mL, diphenoxylate-atropine, hydrALAZINE, sodium chloride flush   Vital Signs    Vitals:   10/30/19 0731 10/30/19 0753 10/30/19 1129 10/30/19 1143  BP: 103/75 103/75 105/67 105/67  Pulse: 95 96 (!) 107 100  Resp: 18 16 18 16   Temp: 97.7 F (36.5 C)  (!) 97.5 F (36.4 C)   TempSrc: Axillary  Axillary   SpO2: 96% 96% 98% 99%  Weight:      Height:        Intake/Output Summary (Last 24 hours) at 10/30/2019 1436 Last data filed at 10/30/2019 0838 Gross per 24 hour  Intake 25 ml    Output --  Net 25 ml   Last 3 Weights 10/30/2019 10/29/2019 10/28/2019  Weight (lbs) 157 lb 162 lb 161 lb 13.1 oz  Weight (kg) 71.215 kg 73.483 kg 73.4 kg  Some encounter information is confidential and restricted. Go to Review Flowsheets activity to see all data.       Telemetry   NSR 90-105 Personally reviewed   Physical Exam   General:  Awake restless. Not communicative,  HEENT: normal Neck: supple. no JVD. Carotids 2+ bilat; no bruits. No lymphadenopathy or thryomegaly appreciated. Cor: PMI nondisplaced. Regular rate & rhythm. No rubs, gallops or murmurs. Lungs: clear Abdomen: obese soft, nontender, nondistended. No hepatosplenomegaly. No bruits or masses. Good bowel sounds. Extremities: no cyanosis, clubbing, rash, edema Neuro:  Awake restless. Not communicative,   Labs    High Sensitivity Troponin:   No results for input(s): TROPONINIHS in the last 720 hours.    Chemistry Recent Labs  Lab 10/27/19 0500 10/29/19 0345  NA 137 137  K 3.8 4.0  CL 100 103  CO2 26 26  GLUCOSE 145* 148*  BUN 14 19  CREATININE 0.56 0.56  CALCIUM 9.9 9.8  PROT  --  8.0  ALBUMIN  --  3.4*  AST  --  47*  ALT  --  84*  ALKPHOS  --  129*  BILITOT  --  0.5  GFRNONAA >60 >60  GFRAA >60 >60  ANIONGAP 11 8     Hematology Recent Labs  Lab 10/29/19 0345  WBC 8.8  RBC 4.29  HGB 11.1*  HCT 36.1  MCV 84.1  MCH 25.9*  MCHC 30.7  RDW 17.8*  PLT 604*    BNP No results for input(s): BNP, PROBNP in the last 168 hours.   DDimer No results for input(s): DDIMER in the last 168 hours.   Radiology    DG Swallowing Func-Speech Pathology  Result Date: 10/29/2019 Objective Swallowing Evaluation: Type of Study: Bedside Swallow Evaluation  Patient Details Name: Beverly [name] MRN: [id number] Date of Birth: 06-19-1985 Today's Date: 10/29/2019 Time: SLP Start Time (ACUTE ONLY): 1040 -SLP Stop Time (ACUTE ONLY): 1100 SLP Time Calculation (min) (ACUTE ONLY): 20 min Past Medical History: Past  Medical History: Diagnosis Date . Hypertension   last pregnancy . Vaginal Pap smear, abnormal   when she was 34yo Past Surgical History: Past Surgical History: Procedure Laterality Date . IR GASTROSTOMY TUBE MOD SED  10/07/2019 . LEFT HEART CATH AND CORONARY ANGIOGRAPHY N/A 09/23/2019  Procedure: LEFT HEART CATH AND CORONARY ANGIOGRAPHY;  Surgeon: Nelva Bush, MD;  Location: [place] [facility];  Service: Cardiovascular;  Laterality: N/A; HPI: Pt is a 34 y.o. female with PMH of HTN who was admitted 09/23/19 2 weeks post partum after emergent C section due to rupture of membranes with VF arrest. Pt with enterobacter PNA and encephalopathy, trach placed 8/30 and PEG on 8/31. MRI 8/31: No evidence of anoxic brain injury. Pt changed to #4 cuffless trach on 9/15; capped on 9/21.  No data recorded Assessment / Plan / Recommendation CHL IP CLINICAL IMPRESSIONS 10/29/2019 Clinical Impression Pt presents with oral dysphagia characterized by impaired posterior propulsion, impaired mastication, lingual pumping, and oral holding. Verbal and tactile cueing were intermittently effective in reducing length of bolus holding and facilitating deglutition. However, suctioning was required once and regular texture solids were removed from the pt's oral cavity following limited mastication with her incisors without any further bolus manipulation. [name] pharyngeal swallow was within normal limits without pharyngeal residue or any instances of laryngeal invasion. [name] impairments are likely cognitively based; however, the impact of the flavor of boluses on her performance is also considered. It is recommended that a clear liquid diet be initiated at this time and SLP will follow for dysphagia treatment. SLP Visit Diagnosis Dysphagia, oral phase (R13.11) Attention and concentration deficit following -- Frontal lobe and executive function deficit following -- Impact on safety and function Mild aspiration risk   CHL IP TREATMENT  RECOMMENDATION 10/29/2019 Treatment Recommendations Therapy as outlined in treatment plan below   Prognosis 10/29/2019 Prognosis for Safe Diet Advancement Good Barriers to Reach Goals Cognitive deficits Barriers/Prognosis Comment -- CHL IP DIET RECOMMENDATION 10/29/2019 SLP Diet Recommendations Thin liquid Liquid Administration via Cup;Straw Medication Administration Via alternative means Compensations -- Postural Changes Seated upright at 90 degrees   CHL IP OTHER RECOMMENDATIONS 10/29/2019 Recommended Consults -- Oral Care Recommendations Oral care BID Other Recommendations --   CHL IP FOLLOW UP RECOMMENDATIONS 10/29/2019 Follow up Recommendations Inpatient Rehab   CHL IP FREQUENCY AND DURATION 10/29/2019 Speech Therapy Frequency (ACUTE ONLY) min 2x/week Treatment Duration 2 weeks      CHL IP ORAL PHASE 10/29/2019 Oral Phase Impaired Oral - Pudding Teaspoon -- Oral - Pudding Cup --  Oral - Honey Teaspoon -- Oral - Honey Cup -- Oral - Nectar Teaspoon -- Oral - Nectar Cup -- Oral - Nectar Straw -- Oral - Thin Teaspoon -- Oral - Thin Cup Lingual pumping;Reduced posterior propulsion;Holding of bolus Oral - Thin Straw Lingual pumping;Reduced posterior propulsion;Holding of bolus Oral - Puree Lingual pumping;Reduced posterior propulsion;Holding of bolus Oral - Mech Soft -- Oral - Regular Lingual pumping;Reduced posterior propulsion;Holding of bolus;Impaired mastication Oral - Multi-Consistency -- Oral - Pill -- Oral Phase - Comment --  CHL IP PHARYNGEAL PHASE 10/29/2019 Pharyngeal Phase WFL Pharyngeal- Pudding Teaspoon -- Pharyngeal -- Pharyngeal- Pudding Cup -- Pharyngeal -- Pharyngeal- Honey Teaspoon -- Pharyngeal -- Pharyngeal- Honey Cup -- Pharyngeal -- Pharyngeal- Nectar Teaspoon -- Pharyngeal -- Pharyngeal- Nectar Cup -- Pharyngeal -- Pharyngeal- Nectar Straw -- Pharyngeal -- Pharyngeal- Thin Teaspoon -- Pharyngeal -- Pharyngeal- Thin Cup -- Pharyngeal -- Pharyngeal- Thin Straw -- Pharyngeal -- Pharyngeal- Puree --  Pharyngeal -- Pharyngeal- Mechanical Soft -- Pharyngeal -- Pharyngeal- Regular -- Pharyngeal -- Pharyngeal- Multi-consistency -- Pharyngeal -- Pharyngeal- Pill -- Pharyngeal -- Pharyngeal Comment --  No flowsheet data found. Shanika I. Hardin Negus, [place], [place] Office number [phone] Pager Winter 10/29/2019, 11:41 AM               Cardiac Studies   See section above (reviewed)  Patient Profile     34 y.o. female 2 weeks post-partum with VF/torsades arrest in the setting of anterior MI  Assessment & Plan    1. VF arrest (in-hospital): in setting of anterior infarct. - Rhythm stable unfortunately with severe anoxic brain injury. (see below) - EEG 8/20 diffuse slowing but improved - Brain MRI normal x 2. EEG still with diffuse slowing - Remains on vent through trach.  - No VT on tele. Keep K> 4.0 Mg > 2.0  2. Acute systolic heart failure: secondary to anterior MI, 09/24/19 LVEF 35%.  - Volume status stable  - On carvedilol 6.25 bid - Continue Entresto 24-26 mg bid dose cut back due to soft BP.  - Continue dig 0.125  - Continue spiro 25 mg daily.  3. CAD/Anterior MI: angio reviewed again. Appears to have aneurysmal proximal LAD with dissection, likely culprit. Had TIMI-3 flow at cath but large infarct.  - No current s/s ischemia - Continue ASA,  beta-blocker and statin.  - Continue medical therapy for now 4. Encephalopathy, post-arrest. Likely anoxic brain injury  - MRI 09/29/19 & 10/07/19 negative.  - EEG 8/31 EEGwith diffuse slowing 5. Hypokalemia/hypomag - stable recent k 4.0  6. Hypertension - stable today  - Abx per TRH 7. SVT on 10/03/19 - Resolved. Continue current dose of carvedilol.  - No recurrence  The HF team will sign off. Continue current meds. Call with questions.   Repeat echo 2-3 months.     Signed, Glori Bickers, MD  10/30/2019, 2:36 PM

## 2019-10-30 NOTE — Progress Notes (Addendum)
Nutrition Follow-up  DOCUMENTATION CODES:   Not applicable  INTERVENTION:   Transition to bolus feedings: -1 carton/ARC of Osmolite 1.5 six times daily  -45 ml ProSource TID  Provides: 2290 kcals, 123 grams protein, 1086 ml free water.   NUTRITION DIAGNOSIS:   Increased nutrient needs related to post-op healing as evidenced by estimated needs.  Ongoing  GOAL:   Patient will meet greater than or equal to 90% of their needs  Addressed via TF  MONITOR:   Vent status, Skin, TF tolerance, I & O's, Labs, Weight trends  REASON FOR ASSESSMENT:   Ventilator    ASSESSMENT:   Patient with PMH significant for HTN and 2 weeks post partum emergency C-section. Presents this admission with new onset peripartum cardiomyopathy with VF arrest.   8/30- trach, PEG 9/4- on ATC 9/20- trach downsized, capped  Diet advanced to clears after MBS yesterday. Pt having issues with agitation at night. Trying to pull out PEG. Abdominal binder on at this time. Transition to bolus feedings to allow pt to be free from lines. Continue feedings to meet 100% of nutrition needs until PO intake progresses and remains consistent.   Admission weight: 103.1 kg  Current weight: 71.2 kg   Medications: SS novolog, prenatal MVI, aldactone Labs: CBG 90-147  Diet Order:   Diet Order            Diet clear liquid Room service appropriate? No; Fluid consistency: Thin  Diet effective now                 EDUCATION NEEDS:   Not appropriate for education at this time  Skin:  Skin Assessment: Reviewed RN Assessment  Last BM:  9/21  Height:   Ht Readings from Last 1 Encounters:  09/24/19 5\' 10"  (1.778 m)    Weight:   Wt Readings from Last 1 Encounters:  10/30/19 71.2 kg    BMI:  Body mass index is 22.53 kg/m.  Estimated Nutritional Needs:   Kcal:  2150-2350 kcal  Protein:  115-130 grams  Fluid:  >/= 2 L/day   11/01/19 RD, LDN Clinical Nutrition Pager listed in AMION

## 2019-10-30 NOTE — Progress Notes (Signed)
Occupational Therapy Treatment Patient Details Name: Beverly [name] MRN: [id number] DOB: Nov 14, 1985 Today's Date: 10/30/2019    History of present illness Pt is a 34 y.o. female admitted 09/23/19 with intermittent chest discomfort; of note, pt 2-weeks post-partum (had emergent C-section); while in ED, pt with VF cardiac arrest requiring intubation. Brain MRI 8/23 with no acute abnormality. S/p trach and PEG tube 8/30. Course complicated by encephalopathy, anoxic brain injury, enterobacterial PNA. Transferred out of ICU On 9/4. PMH includes HTN.   OT comments  Pt progressing well towards OT goals this session. Mother present throughout session. Performed AAROM with BUE - educating mother on importance of ROM for function, cleaning etc. Pt then mod A +2 to come to EOB (initially max A to maintain seated balance and able to progress to moments of min guard), mod A +2 for sit<>stand from EOB with use of the Stedy. Pt continues to be hand over hand total A for all aspects of ADL - but does smile and enjoyed listening to [place] during mobility working on weight shifting. Pt will require comprehensive inpatient rehab level therapy prior to home to maximize safety and independence in ADL and functional transfers.   Follow Up Recommendations  CIR    Equipment Recommendations  Other (comment) (defer to next venue of care)    Recommendations for Other Services      Precautions / Restrictions Precautions Precautions: Fall;Other (comment) Precaution Comments: peg, incontinence Restrictions Weight Bearing Restrictions: No       Mobility Bed Mobility Overal bed mobility: Needs Assistance Bed Mobility: Supine to Sit     Supine to sit: Mod assist;+2 for physical assistance     General bed mobility comments: given time, Pt was able to shift weight, initiate movement mod A +2 for physical assist  Transfers Overall transfer level: Needs assistance Equipment used: 2 person hand held  assist Transfers: Sit to/from Stand Sit to Stand: Mod assist;+2 physical assistance         General transfer comment: Multiple sit<>stands with Stedy frame, modA+2 to assist trunk elevation and cue hip extension, maxA to place BUEs on bar support; from [place] seat, pt automatically standing multiple times with minA+1-2, reliant on BLEs locked in extension for stability; 1x standing trial from recliner with bilat HHA and modA+2, pt again with BLEs locked in extension and LLE leaning against seat    Balance Overall balance assessment: Needs assistance Sitting-balance support: Bilateral upper extremity supported;Feet supported Sitting balance-Leahy Scale: Poor Sitting balance - Comments: Initial maxA due to R-lateral posterior lean, BUE support and cues for anterior weight translation progressing to brief periods of min guard-minA with HHA for sitting balance Postural control: Posterior lean Standing balance support: No upper extremity supported Standing balance-Leahy Scale: Poor Standing balance comment: Reliant on UE support and external assist to maintain static standing without DME                           ADL either performed or assessed with clinical judgement   ADL Overall ADL's : Needs assistance/impaired     Grooming: Wash/dry face;Total assistance Grooming Details (indicate cue type and reason): Total hand over hand for washing her face                 Toilet Transfer: Moderate assistance;+2 for physical assistance;+2 for safety/equipment Antony Salmon)   Toileting- Clothing Manipulation and Hygiene: Total assistance Toileting - Clothing Manipulation Details (indicate cue type and reason): briefs on  Vision       Perception     Praxis      Cognition Arousal/Alertness: Awake/alert Behavior During Therapy: Flat affect Overall Cognitive Status: Impaired/Different from baseline Area of Impairment: Attention;Following commands;Problem  solving                   Current Attention Level: Focused;Sustained   Following Commands: Follows one step commands inconsistently     Problem Solving: Decreased initiation;Difficulty sequencing;Requires verbal cues;Requires tactile cues;Slow processing General Comments: Pt following some simple commands, demonstrates initiation of task with simple cues, increased time for this and inconsistent. Pt alert and interactive, smiling at therapist, responding well to mother in room. No verbalizations        Exercises Other Exercises Other Exercises: AAROM of BUE at shoulder, elbow, wrist and hands Other Exercises: BLE hip flexion and knee flex/ext noted, not consistent to command   Shoulder Instructions       General Comments Mother present and supportive. HR up to 153, down to 106 with seated rest; pt in no apparent distress. Pt with fatigue post-session, keeping eyes closed once settled and comfortable in recliner    Pertinent Vitals/ Pain       Pain Assessment: Faces Faces Pain Scale: No hurt Pain Intervention(s): Monitored during session;Repositioned  Home Living                                          Prior Functioning/Environment              Frequency  Min 2X/week        Progress Toward Goals  OT Goals(current goals can now be found in the care plan section)  Progress towards OT goals: Progressing toward goals  Acute Rehab OT Goals Patient Stated Goal: unable to state OT Goal Formulation: Patient unable to participate in goal setting Time For Goal Achievement: 11/04/19 Potential to Achieve Goals: Fair  Plan Frequency remains appropriate;Discharge plan needs to be updated    Co-evaluation    PT/OT/SLP Co-Evaluation/Treatment: Yes Reason for Co-Treatment: Complexity of the patient's impairments (multi-system involvement);Necessary to address cognition/behavior during functional activity;To address functional/ADL transfers PT goals  addressed during session: Mobility/safety with mobility;Balance;Strengthening/ROM OT goals addressed during session: ADL's and self-care;Strengthening/ROM      AM-PAC OT "6 Clicks" Daily Activity     Outcome Measure   Help from another person eating meals?: Total Help from another person taking care of personal grooming?: Total Help from another person toileting, which includes using toliet, bedpan, or urinal?: Total Help from another person bathing (including washing, rinsing, drying)?: Total Help from another person to put on and taking off regular upper body clothing?: Total Help from another person to put on and taking off regular lower body clothing?: Total 6 Click Score: 6    End of Session Equipment Utilized During Treatment: Gait belt Antony Salmon)  OT Visit Diagnosis: Other symptoms and signs involving cognitive function;Muscle weakness (generalized) (M62.81)   Activity Tolerance Patient tolerated treatment well   Patient Left in chair;with call bell/phone within reach;with family/visitor present (posey belt on)   Nurse Communication Mobility status;Need for lift equipment        Time: [phone] OT Time Calculation (min): 41 min  Charges: OT General Charges $OT Visit: 1 Visit OT Treatments $Therapeutic Activity: 8-22 mins  Nyoka Cowden OTR/L Acute Rehabilitation Services Pager: [phone] Office: [phone]  Evern Bio Aften Lipsey  10/30/2019, 12:52 PM

## 2019-10-30 NOTE — Plan of Care (Signed)
  Problem: Education: Goal: Knowledge of General Education information will improve Description: Including pain rating scale, medication(s)/side effects and non-pharmacologic comfort measures 10/30/2019 0931 by Viviana Simpler, RN Outcome: Adequate for Discharge    Entered in error

## 2019-10-30 NOTE — Progress Notes (Signed)
PROGRESS NOTE    Beverly [name]  LTJ:[id number] DOB: 1985/09/21 DOA: 09/24/2019 PCP: Patient, No Pcp Per    Brief Narrative: 34 year old female presents to the hospital 2 weeks postpartum with chest pain and in the ED sustained V. fib arrest with V. fib x3 epi x1, EKG showed ST elevation underwent Cath Lab at [facility] showed aneurysmal dilation(dissection?) of the LAD was identified. She was transferred to [facility] for ICU admission.LVEF 30% EF in cath lab.  Echo 8/18>LVEF 30-35%, septal and apical akinesis distal anterior wall an d mid/distal inferior wall hypokinesis hyperdynamic basal function. LV size mildly dilated.  Patient suffered anoxic brain injury. 8.23:MRI brain  8/30 underwent tracheostomy 8/30 PEG tube placement 9/10: Continues to be encephalopathic, able to open eyes and track but not following any commands and not talking,is moving extremities independently 9/15: Tracheostomy downsized to # 4 cufless. 9/20:Trach CAP trial, PCCM following SNF planned for 9/30- 30 days out from trach 9/21:Patient is showing improvement with the speech therapy and PT-recommendation changed from SNF to inpatient rehab.   Subjective: Alert,awake, anxious Slept few hours after xanax last night 0.25 mg Not interacting with me Mother not at bedside-but she arrived later and I came back and spoke to her  Assessment & Plan:  V. fib arrest due to anterior MI status post cardiac cath, found to have aneurysmal proximal LAD with dissection likely culprit.  Seen by cardiology and will continue on current Aspirin 81 mg, Lipitor 80 mg, Coreg 6.25 bid. Cont  Aldactone, digoxin, Entresto  Acute systolic congestive heart failure with EF 35%: 2/2 MI.  Euvolemic.  Entresto dose was cut down 24-26 after discussing with  Cards 9/12 due to soft bp.  BP stable intimately tachycardic BP remains stable.Continue on Coreg, Aldactone, digoxin.   Monitor renal function intermittently.  Acute  encephalopathy/confusion, possible anoxic brain injury: Due to #1.  Had extensive work-up with EEG, MRI brain.  Patient has been trach and PEG dependent.  Now she is more alert awake responding some, speech comprehending status is on diet.  Continue to feed-dietitian to continue to bolus feeding.  Patient has been agitated anxious and not getting enough sleep or sleep.  She is on melatonin.  Patient got low-dose Xanax last night. I have requested formal psychiatric consult.  Discussed with patient's mother in detail.    Hypokalemia/hypomagnesemia labs stable.  Monitor intermittently.  Hypertension: BP stable continue current carvedilol Aldactone and Entresto-dose was decreased due to soft BP  History of SVT on NSR, continue Coreg.  Diarrhea off Flexi-Seal.resolved.  ?Aspiration pneumonia completed antibiotics previously  DVT prophylaxis: enoxaparin (LOVENOX) injection 40 mg Start: 10/07/19 1000 Place and maintain sequential compression device Start: 09/24/19 0136 Code Status:   Code Status: Full Code  Family Communication: plan of care discussed with patient/s mother at the bedside again today.  Status is: Inpatient Remains inpatient appropriate because:Inpatient level of care appropriate due to severity of illness and For ongoing management of tracheostomy as per pulmonary  Dispo: The patient is from: Home              Anticipated d/c is to: CIR consulted 9/21              Anticipated d/c date is: 1-2 days once CIR available.              Patient currently is not medically stable to d/c. Nutrition: Diet Order            Diet clear liquid Room  service appropriate? No; Fluid consistency: Thin  Diet effective now                 Nutrition Problem: Increased nutrient needs Etiology: post-op healing Signs/Symptoms: estimated needs Interventions: Tube feeding Body mass index is 22.53 kg/m.  Consultants:see note  Procedures:see note Microbiology:see note Blood Culture     Component Value Date/Time   SDES URINE, RANDOM 10/08/2019 1316   SPECREQUEST NONE 10/08/2019 1316   CULT  10/08/2019 1316    NO GROWTH Performed at [facility] [address], [place] [employer], [place] [address]    REPTSTATUS 10/09/2019 FINAL 10/08/2019 1316    Other culture-see note  Medications: Scheduled Meds:  aspirin  81 mg Per Tube Daily   atorvastatin  80 mg Per Tube Daily   carvedilol  6.25 mg Per Tube BID WC   chlorhexidine gluconate (MEDLINE KIT)  15 mL Mouth Rinse BID   Chlorhexidine Gluconate Cloth  6 each Topical Daily   digoxin  0.125 mg Per Tube Daily   enoxaparin (LOVENOX) injection  40 mg Subcutaneous Q24H   feeding supplement (PROSource TF)  45 mL Per Tube QID   influenza vac split quadrivalent PF  0.5 mL Intramuscular Tomorrow-1000   insulin aspart  0-15 Units Subcutaneous Q4H   mouth rinse  15 mL Mouth Rinse 10 times per day   melatonin  3 mg Oral QHS   prenatal multivitamin  1 tablet Per Tube Q1200   sacubitril-valsartan  1 tablet Per Tube BID   scopolamine  1 patch Transdermal Q72H   sodium chloride flush  10-40 mL Intracatheter Q12H   spironolactone  25 mg Per Tube Daily   Continuous Infusions:  sodium chloride 10 mL/hr at 10/16/19 1600   feeding supplement (OSMOLITE 1.5 CAL) 1,000 mL (10/27/19 1537)    Antimicrobials: Anti-infectives (From admission, onward)   Start     Dose/Rate Route Frequency Ordered Stop   10/09/19 2100  vancomycin (VANCOREADY) IVPB 1500 mg/300 mL  Status:  Discontinued        1,500 mg 150 mL/hr over 120 Minutes Intravenous Every 12 hours 10/09/19 1237 10/10/19 1121   10/09/19 0800  ceFEPIme (MAXIPIME) 2 g in sodium chloride 0.9 % 100 mL IVPB        2 g 200 mL/hr over 30 Minutes Intravenous Every 8 hours 10/09/19 0752 10/15/19 2147   10/09/19 0800  vancomycin (VANCOREADY) IVPB 2000 mg/400 mL        2,000 mg 200 mL/hr over 120 Minutes Intravenous  Once 10/09/19 0752 10/09/19 1147   10/07/19 1540  ceFAZolin  (ANCEF) 2-4 GM/100ML-% IVPB       Note to Pharmacy: [name] Manges   : cabinet override      10/07/19 1540 10/08/19 0344   10/07/19 1400  ceFAZolin (ANCEF) IVPB 2g/100 mL premix        2 g 200 mL/hr over 30 Minutes Intravenous To Radiology 10/06/19 1339 10/07/19 1627   09/30/19 0300  vancomycin (VANCOCIN) IVPB 1000 mg/200 mL premix  Status:  Discontinued        1,000 mg 200 mL/hr over 60 Minutes Intravenous Every 8 hours 09/29/19 1809 10/01/19 0853   09/29/19 1815  ceFEPIme (MAXIPIME) 2 g in sodium chloride 0.9 % 100 mL IVPB        2 g 200 mL/hr over 30 Minutes Intravenous Every 8 hours 09/29/19 1803 10/05/19 1801   09/29/19 1815  vancomycin (VANCOREADY) IVPB 2000 mg/400 mL  2,000 mg 200 mL/hr over 120 Minutes Intravenous  Once 09/29/19 1803 09/29/19 2137   09/24/19 2200  cefTRIAXone (ROCEPHIN) 2 g in sodium chloride 0.9 % 100 mL IVPB        2 g 200 mL/hr over 30 Minutes Intravenous Daily at bedtime 09/24/19 1201 09/28/19 2338   09/24/19 0330  cefTRIAXone (ROCEPHIN) 1 g in sodium chloride 0.9 % 100 mL IVPB  Status:  Discontinued        1 g 200 mL/hr over 30 Minutes Intravenous Daily at bedtime 09/24/19 0245 09/24/19 1201   09/24/19 0300  azithromycin (ZITHROMAX) 500 mg in sodium chloride 0.9 % 250 mL IVPB  Status:  Discontinued        500 mg 250 mL/hr over 60 Minutes Intravenous Daily at bedtime 09/24/19 0245 09/24/19 1103     Objective: Vitals: Today's Vitals   10/30/19 0500 10/30/19 0533 10/30/19 0731 10/30/19 0753  BP:  111/85 103/75 103/75  Pulse:  100 95 96  Resp:  _0 Temp:  98.7 F (37.1 C) 97.7 F (36.5 C)   TempSrc:  Oral Axillary   SpO2:  98% 96% 96%  Weight: 71.2 kg     Height:      PainSc:        Intake/Output Summary (Last 24 hours) at 10/30/2019 0902 Last data filed at 10/30/2019 0838 Gross per 24 hour  Intake 25 ml  Output --  Net 25 ml   Filed Weights   10/28/19 0637 10/29/19 0320 10/30/19 0500  Weight: 73.4 kg 73.5 kg 71.2 kg   Weight  change: -2.268 kg  Intake/Output from previous day: 09/22 0701 - 09/23 0700 In: 25 [P.O.:25] Out: -  Intake/Output this shift: No intake/output data recorded.  Examination: General exam: AA,not interactive to me, NAD, weak appearing. HEENT:Oral mucosa moist, Ear/Nose WNL grossly, dentition normal. Respiratory system: bilaterally clear, trach in place, no wheezing or crackles,no use of accessory muscle Cardiovascular system: S1 & S2 +, No JVD,. Gastrointestinal system: Abdomen soft, NT,ND, BS+ Nervous System:Alert, awake, moving extremities and grossly nonfocal, not following commands for me Extremities: No edema, distal peripheral pulses palpable.  Skin: No rashes,no icterus. MSK: Normal muscle bulk,tone, power.  Data Reviewed: I have personally reviewed following labs and imaging studies CBC: Recent Labs  Lab 10/29/19 0345  WBC 8.8  HGB 11.1*  HCT 36.1  MCV 84.1  PLT 474*   Basic Metabolic Panel: Recent Labs  Lab 10/27/19 0500 10/29/19 0345  NA 137 137  K 3.8 4.0  CL 100 103  CO2 26 26  GLUCOSE 145* 148*  BUN 14 19  CREATININE 0.56 0.56  CALCIUM 9.9 9.8  MG 2.0 2.0   GFR: Estimated Creatinine Clearance: 109.2 mL/min (by C-G formula based on SCr of 0.56 mg/dL). Liver Function Tests: Recent Labs  Lab 10/29/19 0345  AST 47*  ALT 84*  ALKPHOS 129*  BILITOT 0.5  PROT 8.0  ALBUMIN 3.4*   No results for input(s): LIPASE, AMYLASE in the last 168 hours. No results for input(s): AMMONIA in the last 168 hours. Coagulation Profile: No results for input(s): INR, PROTIME in the last 168 hours. Cardiac Enzymes: No results for input(s): CKTOTAL, CKMB, CKMBINDEX, TROPONINI in the last 168 hours. BNP (last 3 results) No results for input(s): PROBNP in the last 8760 hours. HbA1C: No results for input(s): HGBA1C in the last 72 hours. CBG: Recent Labs  Lab 10/29/19 1644 10/29/19 2026 10/29/19 2323 10/30/19 0545 10/30/19 0748  GLUCAP 147* 107*  109* 90 96    Lipid Profile: No results for input(s): CHOL, HDL, LDLCALC, TRIG, CHOLHDL, LDLDIRECT in the last 72 hours. Thyroid Function Tests: No results for input(s): TSH, T4TOTAL, FREET4, T3FREE, THYROIDAB in the last 72 hours. Anemia Panel: No results for input(s): VITAMINB12, FOLATE, FERRITIN, TIBC, IRON, RETICCTPCT in the last 72 hours. Sepsis Labs: No results for input(s): PROCALCITON, LATICACIDVEN in the last 168 hours.  No results found for this or any previous visit (from the past 240 hour(s)).   Radiology Studies: DG Swallowing Func-Speech Pathology  Result Date: 10/29/2019 Objective Swallowing Evaluation: Type of Study: Bedside Swallow Evaluation  Patient Details Name: MEGYN LENG MRN: [id number] Date of Birth: [birth year] Today's Date: 10/29/2019 Time: SLP Start Time (ACUTE ONLY): 1040 -SLP Stop Time (ACUTE ONLY): 1100 SLP Time Calculation (min) (ACUTE ONLY): 20 min Past Medical History: Past Medical History: Diagnosis Date  Hypertension   last pregnancy  Vaginal Pap smear, abnormal   when she was 34yo Past Surgical History: Past Surgical History: Procedure Laterality Date  IR GASTROSTOMY TUBE MOD SED  10/07/2019  LEFT HEART CATH AND CORONARY ANGIOGRAPHY N/A 09/23/2019  Procedure: LEFT HEART CATH AND CORONARY ANGIOGRAPHY;  Surgeon: Nelva Bush, MD;  Location: [facility];  Service: Cardiovascular;  Laterality: N/A; HPI: Pt is a 35 y.o. female with PMH of HTN who was admitted 09/23/19 2 weeks post partum after emergent C section due to rupture of membranes with VF arrest. Pt with enterobacter PNA and encephalopathy, trach placed 8/30 and PEG on 8/31. MRI 8/31: No evidence of anoxic brain injury. Pt changed to #4 cuffless trach on 9/15; capped on 9/21.  No data recorded Assessment / Plan / Recommendation CHL IP CLINICAL IMPRESSIONS 10/29/2019 Clinical Impression Pt presents with oral dysphagia characterized by impaired posterior propulsion, impaired mastication, lingual pumping, and  oral holding. Verbal and tactile cueing were intermittently effective in reducing length of bolus holding and facilitating deglutition. However, suctioning was required once and regular texture solids were removed from the pt's oral cavity following limited mastication with her incisors without any further bolus manipulation. [name] pharyngeal swallow was within normal limits without pharyngeal residue or any instances of laryngeal invasion. [name] impairments are likely cognitively based; however, the impact of the flavor of boluses on her performance is also considered. It is recommended that a clear liquid diet be initiated at this time and SLP will follow for dysphagia treatment. SLP Visit Diagnosis Dysphagia, oral phase (R13.11) Attention and concentration deficit following -- Frontal lobe and executive function deficit following -- Impact on safety and function Mild aspiration risk   CHL IP TREATMENT RECOMMENDATION 10/29/2019 Treatment Recommendations Therapy as outlined in treatment plan below   Prognosis 10/29/2019 Prognosis for Safe Diet Advancement Good Barriers to Reach Goals Cognitive deficits Barriers/Prognosis Comment -- CHL IP DIET RECOMMENDATION 10/29/2019 SLP Diet Recommendations Thin liquid Liquid Administration via Cup;Straw Medication Administration Via alternative means Compensations -- Postural Changes Seated upright at 90 degrees   CHL IP OTHER RECOMMENDATIONS 10/29/2019 Recommended Consults -- Oral Care Recommendations Oral care BID Other Recommendations --   CHL IP FOLLOW UP RECOMMENDATIONS 10/29/2019 Follow up Recommendations Inpatient Rehab   CHL IP FREQUENCY AND DURATION 10/29/2019 Speech Therapy Frequency (ACUTE ONLY) min 2x/week Treatment Duration 2 weeks      CHL IP ORAL PHASE 10/29/2019 Oral Phase Impaired Oral - Pudding Teaspoon -- Oral - Pudding Cup -- Oral - Honey Teaspoon -- Oral - Honey Cup -- Oral - Nectar Teaspoon -- Oral - Nectar Cup --  Oral - Nectar Straw -- Oral - Thin Teaspoon -- Oral  - Thin Cup Lingual pumping;Reduced posterior propulsion;Holding of bolus Oral - Thin Straw Lingual pumping;Reduced posterior propulsion;Holding of bolus Oral - Puree Lingual pumping;Reduced posterior propulsion;Holding of bolus Oral - Mech Soft -- Oral - Regular Lingual pumping;Reduced posterior propulsion;Holding of bolus;Impaired mastication Oral - Multi-Consistency -- Oral - Pill -- Oral Phase - Comment --  CHL IP PHARYNGEAL PHASE 10/29/2019 Pharyngeal Phase WFL Pharyngeal- Pudding Teaspoon -- Pharyngeal -- Pharyngeal- Pudding Cup -- Pharyngeal -- Pharyngeal- Honey Teaspoon -- Pharyngeal -- Pharyngeal- Honey Cup -- Pharyngeal -- Pharyngeal- Nectar Teaspoon -- Pharyngeal -- Pharyngeal- Nectar Cup -- Pharyngeal -- Pharyngeal- Nectar Straw -- Pharyngeal -- Pharyngeal- Thin Teaspoon -- Pharyngeal -- Pharyngeal- Thin Cup -- Pharyngeal -- Pharyngeal- Thin Straw -- Pharyngeal -- Pharyngeal- Puree -- Pharyngeal -- Pharyngeal- Mechanical Soft -- Pharyngeal -- Pharyngeal- Regular -- Pharyngeal -- Pharyngeal- Multi-consistency -- Pharyngeal -- Pharyngeal- Pill -- Pharyngeal -- Pharyngeal Comment --  No flowsheet data found. Shanika I. Hardin Negus, [place], [place] Office number [phone] Pager [phone] Horton Marshall 10/29/2019, 11:41 AM                LOS: 21 days   Antonieta Pert, MD [facility]  10/30/2019, 9:02 AM

## 2019-10-30 NOTE — Progress Notes (Addendum)
Physical Therapy Treatment Patient Details Name: Beverly [name] MRN: [id number] DOB: November 28, 1985 Today's Date: 10/30/2019    History of Present Illness Pt is a 34 y.o. female admitted 09/23/19 with intermittent chest discomfort; of note, pt 2-weeks post-partum (had emergent C-section); while in ED, pt with VF cardiac arrest requiring intubation. Brain MRI 8/23 with no acute abnormality. S/p trach and PEG tube 8/30. Course complicated by encephalopathy, anoxic brain injury, enterobacterial PNA. Transferred out of ICU On 9/4. PMH includes HTN.   PT Comments    Pt progressing well with mobility this session; alert and appears happy to participate. Today's session focused on transfer training and standing tolerance, pt requiring modA+2 with and without Stedy standing frame. Initiated pre-gait activity with weight shifts to music. Pt following some simple commands with increased time and cues this session, no verbalizations. Mother present and supportive. Continue to recommend intensive CIR-level therapies to maximize functional mobility and independence prior to return home.   Follow Up Recommendations  CIR;Supervision/Assistance - 24 hour     Equipment Recommendations   (defer)    Recommendations for Other Services       Precautions / Restrictions Precautions Precautions: Fall;Other (comment) Precaution Comments: peg, incontinence Restrictions Weight Bearing Restrictions: No    Mobility  Bed Mobility Overal bed mobility: Needs Assistance Bed Mobility: Supine to Sit     Supine to sit: Mod assist;+2 for physical assistance        Transfers Overall transfer level: Needs assistance Equipment used: 2 person hand held assist Transfers: Sit to/from Stand Sit to Stand: Mod assist;+2 physical assistance         General transfer comment: Multiple sit<>stands with Stedy frame, modA+2 to assist trunk elevation and cue hip extension, maxA to place BUEs on bar support; from Pease seat,  pt automatically standing multiple times with minA+1-2, reliant on BLEs locked in extension for stability; 1x standing trial from recliner with bilat HHA and modA+2, pt again with BLEs locked in extension and LLE leaning against seat  Ambulation/Gait             General Gait Details: MaxA+2 to initiate weight shifts, no knee buckling with this but pt with LLE extended against recliner; did not initiate steps this session   Stairs             Wheelchair Mobility    Modified Rankin (Stroke Patients Only)       Balance Overall balance assessment: Needs assistance   Sitting balance-Leahy Scale: Poor Sitting balance - Comments: Initial maxA due to R-lateral posterior lean, BUE support and cues for anterior weight translation progressing to brief periods of min guard-minA with HHA for sitting balance     Standing balance-Leahy Scale: Poor Standing balance comment: Reliant on UE support and external assist to maintain static standing without DME                            Cognition Arousal/Alertness: Awake/alert Behavior During Therapy: Flat affect Overall Cognitive Status: Impaired/Different from baseline Area of Impairment: Attention;Following commands;Problem solving                   Current Attention Level: Focused;Sustained   Following Commands: Follows one step commands inconsistently     Problem Solving: Decreased initiation;Difficulty sequencing;Requires verbal cues;Requires tactile cues;Slow processing General Comments: Pt following some simple commands, demonstrates initiation of task with simple cues, increased time for this and inconsistent. Pt alert and interactive, smiling  at therapist, responding well to mother in room. No verbalizations      Exercises Other Exercises Other Exercises: BLE hip flexion and knee flex/ext noted, not consistent to command    General Comments General comments (skin integrity, edema, etc.): Mother present  and supportive. HR up to 153, down to 106 with seated rest; pt in no apparent distress. Pt with fatigue post-session, keeping eyes closed once settled and comfortable in recliner      Pertinent Vitals/Pain Pain Assessment: Faces Faces Pain Scale: No hurt Pain Intervention(s): Monitored during session    Home Living                      Prior Function            PT Goals (current goals can now be found in the care plan section) Acute Rehab PT Goals Time For Goal Achievement: 11/13/19 Progress towards PT goals: Progressing toward goals    Frequency    Min 3X/week      PT Plan Current plan remains appropriate    Co-evaluation PT/OT/SLP Co-Evaluation/Treatment: Yes Reason for Co-Treatment: Necessary to address cognition/behavior during functional activity;For patient/therapist safety;To address functional/ADL transfers PT goals addressed during session: Mobility/safety with mobility;Balance        AM-PAC PT "6 Clicks" Mobility   Outcome Measure  Help needed turning from your back to your side while in a flat bed without using bedrails?: A Lot Help needed moving from lying on your back to sitting on the side of a flat bed without using bedrails?: A Lot Help needed moving to and from a bed to a chair (including a wheelchair)?: Total Help needed standing up from a chair using your arms (e.g., wheelchair or bedside chair)?: A Lot Help needed to walk in hospital room?: Total Help needed climbing 3-5 steps with a railing? : Total 6 Click Score: 9    End of Session Equipment Utilized During Treatment: Gait belt Activity Tolerance: Patient tolerated treatment well Patient left: in chair;with call bell/phone within reach;with chair alarm set;with family/visitor present (posey belt chair alarm) Nurse Communication: Mobility status;Need for lift equipment PT Visit Diagnosis: Other abnormalities of gait and mobility (R26.89);Difficulty in walking, not elsewhere  classified (R26.2);Other symptoms and signs involving the nervous system (R29.898)     Time: 1000-1039 PT Time Calculation (min) (ACUTE ONLY): 39 min  Charges:  $Therapeutic Activity: 23-37 mins                    Ina Homes, PT, DPT Acute Rehabilitation Services  Pager [phone] Office [phone]  Malachy Chamber 10/30/2019, 11:29 AM

## 2019-10-30 NOTE — Progress Notes (Signed)
  Speech Language Pathology Treatment: Dysphagia  Patient Details Name: Beverly [name] MRN: [id number] DOB: Jul 16, 1985 Today's Date: 10/30/2019 Time: 4098-1191 SLP Time Calculation (min) (ACUTE ONLY): 21 min  Assessment / Plan / Recommendation Clinical Impression  Pt was seen for dysphagia treatment with her mother, Beverly [name], present. She reported that the pt tolerated lunch without difficulty and did not require significant cueing to swallow. Pt tolerated puree solids and thin liquids via straw without overt s/sx of aspiration. Oral holding was demonstrated with puree and significant verbal and tactile cueing was needed for pt to swallow. Provision of additional thin liquid boluses or prompting pt to open her mouth appeared to facilitate more timely swallowing of puree solids. Considering the level of support that was needed for swallowing of limited boluses of solids during this session, diet advancement is not clinically indicated as yet. SLP will continue to follow pt.    HPI HPI: Pt is a 34 y.o. female with PMH of HTN who was admitted 09/23/19 2 weeks post partum after emergent C section due to rupture of membranes with VF arrest. Pt with enterobacter PNA and encephalopathy, trach placed 8/30 and PEG on 8/31. MRI 8/31: No evidence of anoxic brain injury. Pt changed to #4 cuffless trach on 9/15; capped on 9/21.       SLP Plan  Continue with current plan of care       Recommendations  Diet recommendations: Thin liquid Liquids provided via: Cup;Straw Medication Administration: Via alternative means Supervision: Staff to assist with self feeding;Full supervision/cueing for compensatory strategies Postural Changes and/or Swallow Maneuvers: Seated upright 90 degrees                Oral Care Recommendations: Oral care BID Follow up Recommendations: Inpatient Rehab SLP Visit Diagnosis: Dysphagia, oral phase (R13.11) Plan: Continue with current plan of care       Beverly [name] I.  Vear Clock, MS, CCC-SLP Acute Rehabilitation Services Office number [phone] Pager [phone]                 [name] 10/30/2019, 5:12 PM

## 2019-10-30 NOTE — Progress Notes (Signed)
Patient's mother in room and request to speak with psychiatry about recommendation for trileptal. Paged Caryn Bee at this time via [facility] page. Patient's mother is refusing the trileptal for patient.

## 2019-10-30 NOTE — Plan of Care (Signed)
Continue to monitor

## 2019-10-30 NOTE — Consult Note (Signed)
  Psych consult placed for agitation and insomnia. Per chart review patient is post VF arrest, extended length of stay that has been complicated by coronary artery aneursm and possible coronary artery dissection. She has since stabilized and is now on med surg-cardiac unit, now experiencing agitation and insomnia. Patient with some improved sleep after brief trial of xanax 0.25. Due to cardiac history would refrain from use of antipsychotics for agitaiton. Will recommend increasing benzodiazpine and starting mood stabilizer  - Will increase xanax 0.5mg  po qhs prn for sleep.  WIll start trileptal 75mg  po BID Will refrain from use of antipsychotic as they may contribute to prolonged QTc.  -If sleep continues to cause worsening agitation and mood, will consider starting Trazodone.  Please feel free to reconsult, or call with any questions.

## 2019-10-31 LAB — GLUCOSE, CAPILLARY
Glucose-Capillary: 83 mg/dL (ref 70–99)
Glucose-Capillary: 130 mg/dL — ABNORMAL HIGH (ref 70–99)
Glucose-Capillary: 120 mg/dL — ABNORMAL HIGH (ref 70–99)
Glucose-Capillary: 106 mg/dL — ABNORMAL HIGH (ref 70–99)

## 2019-10-31 MED ORDER — ALPRAZOLAM 0.5 MG PO TABS
0.5000 mg | ORAL_TABLET | Freq: Every day | ORAL | Status: AC
  Administered 2019-10-31: 0.5 mg via ORAL
  Filled 2019-10-31: qty 1

## 2019-10-31 NOTE — Progress Notes (Signed)
PROGRESS NOTE    Beverly [name]  JAS:[id number] DOB: 30-Apr-1985 DOA: 09/24/2019 PCP: Patient, No Pcp Per    Brief Narrative: 34 year old female presents to the hospital 2 weeks postpartum with chest pain and in the ED sustained V. fib arrest with V. fib x3 epi x1, EKG showed ST elevation underwent Cath Lab at [facility] showed aneurysmal dilation(dissection?) of the LAD was identified. She was transferred to [facility] for ICU admission.LVEF 30% EF in cath lab.  Echo 8/18>LVEF 30-35%, septal and apical akinesis distal anterior wall an d mid/distal inferior wall hypokinesis hyperdynamic basal function. LV size mildly dilated.  Patient suffered anoxic brain injury. 8.23:MRI brain  8/30 underwent tracheostomy 8/30 PEG tube placement 9/10: Continues to be encephalopathic, able to open eyes and track but not following any commands and not talking,is moving extremities independently 9/15: Tracheostomy downsized to # 4 cufless. 9/20:Trach CAP trial, PCCM following SNF planned for 9/30- 30 days out from trach 9/21:Patient is showing improvement with the speech therapy and PT-recommendation changed from SNF to inpatient rehab.   Subjective: Nursing reports with Xanax 0.5 mg and very good sleep throughout the night. Briefly woke up but sleepy.  Now on bolus tube feeding and p.o. diet.  Reports that patient smiles back to them follows simple commands during the day but not to me  Assessment & Plan:  V. fib arrest due to anterior MI status post cardiac cath, found to have aneurysmal proximal LAD with dissection likely culprit.  Seen by cardiology and will continue on current Aspirin 81 mg, Lipitor 80 mg, Coreg 6.25 bid.  Aldactone 25 mg, digoxin 0.125 mg, Entresto 24-26 bid, cardio signed off 4/19  Acute systolic congestive heart failure with EF 35%: 2/2 MI.  Euvolemic.  Continue heart failure therapy with beta-blocker Entresto Aldactone  And digoxin as above  Acute  encephalopathy/confusion, possible anoxic brain injury: Due to #1.  Had extensive work-up with EEG, MRI brain.  Patient has been trach and PEG dependent.  Now she is more alert awake responding some, speech comprehending status is on diet.  Continue to feed-dietitian to continue to bolus feeding.  Patient has been agitated anxious and not getting enough sleep.  Seen by psychiatry now on 0.5 mg Xanax bedtime with good response for sleep.  Also placed Trileptal but patient's mother refusing.  Hypokalemia/hypomagnesemia labs stable.  Monitor intermittently.  Hypertension: BP stable continue current carvedilol Aldactone and Entresto-dose was decreased due to soft BP  History of SVT on NSR, continue Coreg.  Diarrhea off Flexi-Seal.resolved.  ?Aspiration pneumonia completed antibiotics previously  DVT prophylaxis: enoxaparin (LOVENOX) injection 40 mg Start: 10/07/19 1000 Place and maintain sequential compression device Start: 09/24/19 0136 Code Status:   Code Status: Full Code  Family Communication: plan of care discussed with patient/s mother at the bedside again today.  Status is: Inpatient Remains inpatient appropriate because:Inpatient level of care appropriate due to severity of illness and For ongoing management of tracheostomy as per pulmonary  Dispo: The patient is from: Home              Anticipated d/c is to: CIR consulted 9/21-for approval.              Anticipated d/c date is: once CIR available.               Patient currently is not medically stable for discharge.  Can go to CIR. Nutrition: Diet Order            Diet clear  liquid Room service appropriate? No; Fluid consistency: Thin  Diet effective now                 Nutrition Problem: Increased nutrient needs Etiology: post-op healing Signs/Symptoms: estimated needs Interventions: Tube feeding Body mass index is 22.53 kg/m.  Consultants:see note  Procedures:see note Microbiology:see note Blood Culture     Component Value Date/Time   SDES URINE, RANDOM 10/08/2019 1316   SPECREQUEST NONE 10/08/2019 1316   CULT  10/08/2019 1316    NO GROWTH Performed at [facility] [address], [place], [place] [address]    REPTSTATUS 10/09/2019 FINAL 10/08/2019 1316    Other culture-see note  Medications: Scheduled Meds: . aspirin  81 mg Per Tube Daily  . atorvastatin  80 mg Per Tube Daily  . carvedilol  6.25 mg Per Tube BID WC  . chlorhexidine gluconate (MEDLINE KIT)  15 mL Mouth Rinse BID  . Chlorhexidine Gluconate Cloth  6 each Topical Daily  . digoxin  0.125 mg Per Tube Daily  . enoxaparin (LOVENOX) injection  40 mg Subcutaneous Q24H  . feeding supplement (OSMOLITE 1.5 CAL)  237 mL Per Tube 6 X Daily  . feeding supplement (PROSource TF)  45 mL Per Tube TID  . influenza vac split quadrivalent PF  0.5 mL Intramuscular Tomorrow-1000  . insulin aspart  0-15 Units Subcutaneous Q4H  . mouth rinse  15 mL Mouth Rinse 10 times per day  . melatonin  3 mg Oral QHS  . OXcarbazepine  75 mg Oral BID  . prenatal multivitamin  1 tablet Per Tube Q1200  . sacubitril-valsartan  1 tablet Per Tube BID  . scopolamine  1 patch Transdermal Q72H  . sodium chloride flush  10-40 mL Intracatheter Q12H  . spironolactone  25 mg Per Tube Daily   Continuous Infusions: . sodium chloride 10 mL/hr at 10/16/19 1600    Antimicrobials: Anti-infectives (From admission, onward)   Start     Dose/Rate Route Frequency Ordered Stop   10/09/19 2100  vancomycin (VANCOREADY) IVPB 1500 mg/300 mL  Status:  Discontinued        1,500 mg 150 mL/hr over 120 Minutes Intravenous Every 12 hours 10/09/19 1237 10/10/19 1121   10/09/19 0800  ceFEPIme (MAXIPIME) 2 g in sodium chloride 0.9 % 100 mL IVPB        2 g 200 mL/hr over 30 Minutes Intravenous Every 8 hours 10/09/19 0752 10/15/19 2147   10/09/19 0800  vancomycin (VANCOREADY) IVPB 2000 mg/400 mL        2,000 mg 200 mL/hr over 120 Minutes Intravenous  Once 10/09/19 0752  10/09/19 1147   10/07/19 1540  ceFAZolin (ANCEF) 2-4 GM/100ML-% IVPB       Note to Pharmacy: [name] Manges   : cabinet override      10/07/19 1540 10/08/19 0344   10/07/19 1400  ceFAZolin (ANCEF) IVPB 2g/100 mL premix        2 g 200 mL/hr over 30 Minutes Intravenous To Radiology 10/06/19 1339 10/07/19 1627   09/30/19 0300  vancomycin (VANCOCIN) IVPB 1000 mg/200 mL premix  Status:  Discontinued        1,000 mg 200 mL/hr over 60 Minutes Intravenous Every 8 hours 09/29/19 1809 10/01/19 0853   09/29/19 1815  ceFEPIme (MAXIPIME) 2 g in sodium chloride 0.9 % 100 mL IVPB        2 g 200 mL/hr over 30 Minutes Intravenous Every 8 hours 09/29/19 1803 10/05/19 1801   09/29/19 1815  vancomycin (VANCOREADY) IVPB 2000 mg/400 mL        2,000 mg 200 mL/hr over 120 Minutes Intravenous  Once 09/29/19 1803 09/29/19 2137   09/24/19 2200  cefTRIAXone (ROCEPHIN) 2 g in sodium chloride 0.9 % 100 mL IVPB        2 g 200 mL/hr over 30 Minutes Intravenous Daily at bedtime 09/24/19 1201 09/28/19 2338   09/24/19 0330  cefTRIAXone (ROCEPHIN) 1 g in sodium chloride 0.9 % 100 mL IVPB  Status:  Discontinued        1 g 200 mL/hr over 30 Minutes Intravenous Daily at bedtime 09/24/19 0245 09/24/19 1201   09/24/19 0300  azithromycin (ZITHROMAX) 500 mg in sodium chloride 0.9 % 250 mL IVPB  Status:  Discontinued        500 mg 250 mL/hr over 60 Minutes Intravenous Daily at bedtime 09/24/19 0245 09/24/19 1103     Objective: Vitals: Today's Vitals   10/30/19 2316 10/30/19 2353 10/31/19 0208 10/31/19 0901  BP: 116/67   101/67  Pulse: 82 95 77 77  Resp: 19 (!) 21  18  Temp: 98.3 F (36.8 C)   98.1 F (36.7 C)  TempSrc: Oral   Axillary  SpO2: 98%  94% 96%  Weight:      Height:      PainSc:       No intake or output data in the 24 hours ending 10/31/19 0940 Filed Weights   10/28/19 0637 10/29/19 0320 10/30/19 0500  Weight: 73.4 kg 73.5 kg 71.2 kg   Weight change:   Intake/Output from previous day: No  intake/output data recorded. Intake/Output this shift: No intake/output data recorded.  Examination: General exam: Sleepy, on room air, NAD, weak appearing. HEENT:Oral mucosa moist, Ear/Nose WNL grossly, dentition normal. Respiratory system: bilaterally clear,no wheezing or crackles,no use of accessory muscle. Trach + Cardiovascular system: S1 & S2 +, No JVD,. Gastrointestinal system: Abdomen soft, NT,ND, BS+ Nervous System:moving extremities on her own,limited exam as she does not follow command for me/sleepy. Extremities: No edema, distal peripheral pulses palpable.  Skin: No rashes,no icterus. MSK: Normal muscle bulk,tone, power  Data Reviewed: I have personally reviewed following labs and imaging studies CBC: Recent Labs  Lab 10/29/19 0345  WBC 8.8  HGB 11.1*  HCT 36.1  MCV 84.1  PLT 846*   Basic Metabolic Panel: Recent Labs  Lab 10/27/19 0500 10/29/19 0345  NA 137 137  K 3.8 4.0  CL 100 103  CO2 26 26  GLUCOSE 145* 148*  BUN 14 19  CREATININE 0.56 0.56  CALCIUM 9.9 9.8  MG 2.0 2.0   GFR: Estimated Creatinine Clearance: 109.2 mL/min (by C-G formula based on SCr of 0.56 mg/dL). Liver Function Tests: Recent Labs  Lab 10/29/19 0345  AST 47*  ALT 84*  ALKPHOS 129*  BILITOT 0.5  PROT 8.0  ALBUMIN 3.4*   No results for input(s): LIPASE, AMYLASE in the last 168 hours. No results for input(s): AMMONIA in the last 168 hours. Coagulation Profile: No results for input(s): INR, PROTIME in the last 168 hours. Cardiac Enzymes: No results for input(s): CKTOTAL, CKMB, CKMBINDEX, TROPONINI in the last 168 hours. BNP (last 3 results) No results for input(s): PROBNP in the last 8760 hours. HbA1C: No results for input(s): HGBA1C in the last 72 hours. CBG: Recent Labs  Lab 10/30/19 1149 10/30/19 1620 10/30/19 2008 10/30/19 2314 10/31/19 0558  GLUCAP 97 96 82 134* 83   Lipid Profile: No results for input(s): CHOL, HDL, LDLCALC, TRIG,  CHOLHDL, LDLDIRECT in the  last 72 hours. Thyroid Function Tests: No results for input(s): TSH, T4TOTAL, FREET4, T3FREE, THYROIDAB in the last 72 hours. Anemia Panel: No results for input(s): VITAMINB12, FOLATE, FERRITIN, TIBC, IRON, RETICCTPCT in the last 72 hours. Sepsis Labs: No results for input(s): PROCALCITON, LATICACIDVEN in the last 168 hours.  No results found for this or any previous visit (from the past 240 hour(s)).   Radiology Studies: DG Swallowing Func-Speech Pathology  Result Date: 10/29/2019 Objective Swallowing Evaluation: Type of Study: Bedside Swallow Evaluation  Patient Details Name: BETTIE CAPISTRAN MRN: [id number] Date of Birth: 07-Oct-1985 Today's Date: 10/29/2019 Time: SLP Start Time (ACUTE ONLY): 1040 -SLP Stop Time (ACUTE ONLY): 1100 SLP Time Calculation (min) (ACUTE ONLY): 20 min Past Medical History: Past Medical History: Diagnosis Date . Hypertension   last pregnancy . Vaginal Pap smear, abnormal   when she was 34yo Past Surgical History: Past Surgical History: Procedure Laterality Date . IR GASTROSTOMY TUBE MOD SED  10/07/2019 . LEFT HEART CATH AND CORONARY ANGIOGRAPHY N/A 09/23/2019  Procedure: LEFT HEART CATH AND CORONARY ANGIOGRAPHY;  Surgeon: Nelva Bush, MD;  Location: [facility];  Service: Cardiovascular;  Laterality: N/A; HPI: Pt is a 34 y.o. female with PMH of HTN who was admitted 09/23/19 2 weeks post partum after emergent C section due to rupture of membranes with VF arrest. Pt with enterobacter PNA and encephalopathy, trach placed 8/30 and PEG on 8/31. MRI 8/31: No evidence of anoxic brain injury. Pt changed to #4 cuffless trach on 9/15; capped on 9/21.  No data recorded Assessment / Plan / Recommendation CHL IP CLINICAL IMPRESSIONS 10/29/2019 Clinical Impression Pt presents with oral dysphagia characterized by impaired posterior propulsion, impaired mastication, lingual pumping, and oral holding. Verbal and tactile cueing were intermittently effective in reducing length of bolus  holding and facilitating deglutition. However, suctioning was required once and regular texture solids were removed from the pt's oral cavity following limited mastication with her incisors without any further bolus manipulation. [name] pharyngeal swallow was within normal limits without pharyngeal residue or any instances of laryngeal invasion. [name] impairments are likely cognitively based; however, the impact of the flavor of boluses on her performance is also considered. It is recommended that a clear liquid diet be initiated at this time and SLP will follow for dysphagia treatment. SLP Visit Diagnosis Dysphagia, oral phase (R13.11) Attention and concentration deficit following -- Frontal lobe and executive function deficit following -- Impact on safety and function Mild aspiration risk   CHL IP TREATMENT RECOMMENDATION 10/29/2019 Treatment Recommendations Therapy as outlined in treatment plan below   Prognosis 10/29/2019 Prognosis for Safe Diet Advancement Good Barriers to Reach Goals Cognitive deficits Barriers/Prognosis Comment -- CHL IP DIET RECOMMENDATION 10/29/2019 SLP Diet Recommendations Thin liquid Liquid Administration via Cup;Straw Medication Administration Via alternative means Compensations -- Postural Changes Seated upright at 90 degrees   CHL IP OTHER RECOMMENDATIONS 10/29/2019 Recommended Consults -- Oral Care Recommendations Oral care BID Other Recommendations --   CHL IP FOLLOW UP RECOMMENDATIONS 10/29/2019 Follow up Recommendations Inpatient Rehab   CHL IP FREQUENCY AND DURATION 10/29/2019 Speech Therapy Frequency (ACUTE ONLY) min 2x/week Treatment Duration 2 weeks      CHL IP ORAL PHASE 10/29/2019 Oral Phase Impaired Oral - Pudding Teaspoon -- Oral - Pudding Cup -- Oral - Honey Teaspoon -- Oral - Honey Cup -- Oral - Nectar Teaspoon -- Oral - Nectar Cup -- Oral - Nectar Straw -- Oral - Thin Teaspoon -- Oral - Thin Cup  Lingual pumping;Reduced posterior propulsion;Holding of bolus Oral - Thin Straw  Lingual pumping;Reduced posterior propulsion;Holding of bolus Oral - Puree Lingual pumping;Reduced posterior propulsion;Holding of bolus Oral - Mech Soft -- Oral - Regular Lingual pumping;Reduced posterior propulsion;Holding of bolus;Impaired mastication Oral - Multi-Consistency -- Oral - Pill -- Oral Phase - Comment --  CHL IP PHARYNGEAL PHASE 10/29/2019 Pharyngeal Phase WFL Pharyngeal- Pudding Teaspoon -- Pharyngeal -- Pharyngeal- Pudding Cup -- Pharyngeal -- Pharyngeal- Honey Teaspoon -- Pharyngeal -- Pharyngeal- Honey Cup -- Pharyngeal -- Pharyngeal- Nectar Teaspoon -- Pharyngeal -- Pharyngeal- Nectar Cup -- Pharyngeal -- Pharyngeal- Nectar Straw -- Pharyngeal -- Pharyngeal- Thin Teaspoon -- Pharyngeal -- Pharyngeal- Thin Cup -- Pharyngeal -- Pharyngeal- Thin Straw -- Pharyngeal -- Pharyngeal- Puree -- Pharyngeal -- Pharyngeal- Mechanical Soft -- Pharyngeal -- Pharyngeal- Regular -- Pharyngeal -- Pharyngeal- Multi-consistency -- Pharyngeal -- Pharyngeal- Pill -- Pharyngeal -- Pharyngeal Comment --  No flowsheet data found. Shanika I. Hardin Negus, [place], [place] Office number [phone] Pager [phone] Horton Marshall 10/29/2019, 11:41 AM                LOS: 83 days   Antonieta Pert, MD [facility]  10/31/2019, 9:40 AM

## 2019-10-31 NOTE — Progress Notes (Addendum)
NAME:  Beverly [name], MRN:  [id number], DOB:  Oct 15, 1985, LOS: 37 ADMISSION DATE:  09/24/2019, CONSULTATION DATE:  8/18 REFERRING MD:  Dr. Eldridge Dace, CHIEF COMPLAINT:  VF arrest   Brief History   34 year old female 2 weeks post partum presented with chest pain and suffered VF arrest.   History of present illness   Patient is encephalopathic and/or intubated. Therefore history has been obtained from chart review.  34 year old female with PMH as below, which is significant for HTN and 2 weeks post-partum. She had emergency C-section delivery due to premature rupture of membranes. She did have some gestational hypertension, but her pregnancy was otherwise uncomplicated. On 8/17 she developed dull chest pain shortly after eating, which prompted ED presentation at [facility]. Resolved spontaneously. No associated symptoms.   Past Medical History   has a past medical history of Hypertension and Vaginal Pap smear, abnormal.  Significant Hospital Events   8/17 chest pain, VF arrest; While in ED, she suffered VT > VF arrest with ROSC after defib x 3 and epi x 1. Immediately after arrest ST elevation was noted on EKG, which did improve on repeat. Differential ACS, SCAD, and toresades/metabolic. Electrolytes were replaced. She was taken to the cath lab at [facility]  Where aneurysmal dilation of the ostial and proximal LAD and was transferred to [facility] for ICU admission.  8/23 possibly febrile. Pan-cultures sent. Still on full support. Gets hypertensive when sedation let up. abx started later in day when temp >101 8/24 opens eyes to stimulus. + strong cough. Tolerating PSV but mental status still not supporting extubation  8/25 about the same  8/27: Seems to be moving a little bit more but mostly posturing.  Opening eyes more.  Had a run of SVT. 8/30 perc trach by PCCM  8/31 PEG with IR 9/2 started on vanc/cefepime for VAP coverage 9/4 transferred out of ICU 9/24 Cap trial started   Consults:    PCCM   Procedures:  8/18> Central Line, removed 8/26 PICC line placed 8/30 trach 8/31 PEG  Significant Diagnostic Tests:  [facility] 8/17 > No coronary artery occlusion or critical stenosis. There is aneurysmal dilation of the ostial and proximal LAD with possible ulceration or dissection of uncertain chronicity.  TIMI-3 flow is noted throughout the LAD and its branches. Severely reduced left ventricular systolic function with mid/apical anterior, apical, and apical inferior hypokinesis/akinesis. LV gram 30% EF.  CT head 8/17 > normal  CTA chest 8/17 > no evidence of PE, moderate severity infiltrates vs ATX, small bilateral pleural effusions. Very small pericaradial effusion.  CT abdomen/pelvis 8/17 > Enlarged, heterogeneous uterus, Small amount of fluid seen in between the right kidney and right lobe of the liver. Echo 8/18> LVEF 30-35%, septal and apical akinesis distal anterior wall an d mid/distal inferior wall hypokinesis hyperdynamic basal function. LV size mildly dilated.  MRI brain 8/23: Normal  Micro Data:  See micro tab Blood 9/1 >> ngtd Sputum 9/1 >staph aureus, enterobacter aerogenes   Antimicrobials:  Vanc 9/2>> 9/3 Cefepime 9/2>> 9/8  Interim history/subjective:  Seen after being assisted back to bed Able to follow simple commands   Objective   Blood pressure 104/86, pulse 99, temperature 98.6 F (37 C), resp. rate 18, height 5\' 10"  (1.778 m), weight 71.2 kg, SpO2 98 %, unknown if currently breastfeeding.    FiO2 (%):  [21 %] 21 %  No intake or output data in the 24 hours ending 10/31/19 1634 11/02/19  10/28/19 0637 10/29/19 0320 10/30/19 0500  Weight: 73.4 kg 73.5 kg 71.2 kg    Examination: GEN: Adult female lying in bed in NAD  HEENT: #4 cuffless trach secure and capped. Trachea midline.   CV: s1s2 regular rate and rhythm, no murmur, rubs, or gallops,  PULM:  Clear to ascultation bilaterally, no increased work of breathing, tolerating trach capped well GI:  soft, bowel sounds active in all 4 quadrants, non-tender, non-distended, abdominal binder in place, PEG tube covered Extremities: warm/dry, no edema  Skin: no rashes or lesions   Assessment & Plan:   Acute Hypoxemic Respiratory Failure 2/2 to Cardiac Arrest, tracheostomy status (currently with #4 cuffless)  -resp failure c/b Pulmonary Edema, Enterobacter and MSSA pneumonia (s/p course of cefepime). - s/p tracheostomy 8/30, transferred out of ICU on ATC 9/4. P Janina Mayo has been capped since 9/20 ans she has tolerated it well  Her mentation is still limited but appears to be improving She is maintaining her secretions and can follow simple commands  Consider decannulation today vs soon given improving mentation will discuss with attending Continue ATC for now  Continue to encourage pulmonary hygiene    Rest per primary team.  PCCM will continue to follow for trach once weekly.  Please call if needs arise sooner.   Delfin Gant, NP-C [facility] Contact / Pager information can be found on Amion  10/31/2019, 4:37 PM

## 2019-10-31 NOTE — TOC Progression Note (Addendum)
Transition of Care (TOC) - Progression Note  Donn Pierini RN, BSN Transitions of Care Unit 4E- RN Case Manager See Treatment Team for direct phone #    Patient Details  Name: Beverly [name] MRN: [id number] Date of Birth: May 18, 1985  Transition of Care [facility]) CM/SW Contact  Zenda Alpers, Lenn Sink, RN Phone Number: 10/31/2019, 3:54 PM  Clinical Narrative:    Noted updated recs for INPT rehab and that [facility] had screened and spoken to mom- per CIR note from L. [facility] is not in-network with pt's insurance and mom is agreeable to look into other options in particular [facility]- reached out to [facility] to have them take a look at pt's clinicals and insurance info.  Spoke with Mom over phone this am to also discuss INPT rehab options- she confirmed [facility] would be her next choice mainly due to distance of w/s from home. However she is interested in speaking to both [facility] inpt rehab and [facility].  Received a call from Ann/Pam with [facility] this am and they have reviewed clinicals and plan to reach out to mom.   Update 1200- received another call from [facility] and they have finished reviewing case and after speaking with mom they feel like pt may be best served by the [facility] INPT rehab.  Have already placed a call to them regarding referral and awaiting return call.   1600- still await return call from [facility] IN[facility] rehab- tried calling mom to update on INPT rehab referrals- unable to leave msg- will try again later once [facility] returns call.  Also reached out to [facility] however they are not in network with pt's insurance plan.    Expected Discharge Plan: IP Rehab Facility Barriers to Discharge: Continued Medical Work up, SNF Pending Medicaid, SNF Pending bed offer  Expected Discharge Plan and Services Expected Discharge Plan: IP Rehab Facility In-house Referral: Clinical Social Work   Post Acute Care Choice: IP Rehab                                          Social Determinants of Health (SDOH) Interventions    Readmission Risk Interventions No flowsheet data found.

## 2019-10-31 NOTE — Progress Notes (Signed)
Patient had BM and voided. Patient cleaned and peri care done at this time. Patient transferred to chair using steady with 4 max assist. Chair alarm on and patient's mother at bedside.

## 2019-10-31 NOTE — Progress Notes (Signed)
Physical Therapy Treatment Patient Details Name: TIFFINE HENIGAN MRN: [id number] DOB: October 25, 1985 Today's Date: 10/31/2019    History of Present Illness Pt is a 34 y.o. female admitted 09/23/19 with intermittent chest discomfort; of note, pt 2-weeks post-partum (had emergent C-section); while in ED, pt with VF cardiac arrest requiring intubation. Brain MRI 8/23 with no acute abnormality. S/p trach and PEG tube 8/30. Course complicated by encephalopathy, anoxic brain injury, enterobacterial PNA. Transferred out of ICU On 9/4. PMH includes HTN.    PT Comments    Pt was seen for sitting bedside for balance training, to increase her control of trunk and leg muscles with softer mattress surface.  Pt apparently has been sleeping poorly for two days, and nursing requested a day of remaining in bed.  Pt is working with PT and mother encouraging her, but did have several moments of pt becoming agitated and pulling forward strongly with her core.  Pt did there ex with focus on LLE which seems weaker overall, and does follow instructions quite well.  Repositioned at end of session to be upright in the bed and able to be as interactive as possible with people coming into the room.     Follow Up Recommendations  CIR     Equipment Recommendations  Other (comment) (CIR to order)    Recommendations for Other Services       Precautions / Restrictions Precautions Precautions: Fall;Other (comment) Precaution Comments: peg, incontinence Restrictions Weight Bearing Restrictions: No    Mobility  Bed Mobility Overal bed mobility: Needs Assistance Bed Mobility: Supine to Sit Rolling: Max assist   Supine to sit: Max assist Sit to supine: Max assist      Transfers                 General transfer comment: deferred per nsg request  Ambulation/Gait                 Stairs             Wheelchair Mobility    Modified Rankin (Stroke Patients Only)       Balance Overall  balance assessment: Needs assistance Sitting-balance support: Bilateral upper extremity supported;Feet supported Sitting balance-Leahy Scale: Poor Sitting balance - Comments: between max assist depending on shift of wgt to min guard for short times Postural control: Posterior lean;Other (comment) (strong forward lean)                                  Cognition Arousal/Alertness: Awake/alert;Lethargic Behavior During Therapy: Flat affect Overall Cognitive Status: Impaired/Different from baseline Area of Impairment: Attention;Following commands;Problem solving               Rancho Levels of Cognitive Functioning Rancho Los Amigos Scales of Cognitive Functioning: Localized response   Current Attention Level: Selective;Sustained   Following Commands: Follows one step commands inconsistently     Problem Solving: Decreased initiation;Difficulty sequencing;Requires verbal cues;Requires tactile cues General Comments: pt is at times aggressive and pulling hard forward, upset with not getting to the chair.  Pt was requested by nursing to stay in bed today      Exercises General Exercises - Lower Extremity Ankle Circles/Pumps: AROM;PROM;Both Long Arc Quad: Both;10 reps Heel Slides: Both;10 reps Hip ABduction/ADduction: Both;10 reps Hip Flexion/Marching: Both;10 reps    General Comments General comments (skin integrity, edema, etc.): pt responding well to her mother being in room  Pertinent Vitals/Pain Pain Assessment: Faces Faces Pain Scale: No hurt    Home Living                      Prior Function            PT Goals (current goals can now be found in the care plan section) Acute Rehab PT Goals Patient Stated Goal: unable to state    Frequency    Min 3X/week      PT Plan Current plan remains appropriate    Co-evaluation              AM-PAC PT "6 Clicks" Mobility   Outcome Measure  Help needed turning from your back to  your side while in a flat bed without using bedrails?: A Lot Help needed moving from lying on your back to sitting on the side of a flat bed without using bedrails?: A Lot Help needed moving to and from a bed to a chair (including a wheelchair)?: Total Help needed standing up from a chair using your arms (e.g., wheelchair or bedside chair)?: Total Help needed to walk in hospital room?: Total Help needed climbing 3-5 steps with a railing? : Total 6 Click Score: 8    End of Session   Activity Tolerance: Patient tolerated treatment well Patient left: in bed;with call bell/phone within reach;with bed alarm set;with family/visitor present Nurse Communication: Mobility status PT Visit Diagnosis: Other abnormalities of gait and mobility (R26.89);Difficulty in walking, not elsewhere classified (R26.2);Other symptoms and signs involving the nervous system (R29.898)     Time: 1127-1206 PT Time Calculation (min) (ACUTE ONLY): 39 min  Charges:  $Therapeutic Activity: 23-37 mins $Neuromuscular Re-education: 8-22 mins                Ivar Drape 10/31/2019, 10:22 PM  Samul Dada, PT MS Acute Rehab Dept. Number: [facility] [id number] and [facility] [phone]

## 2019-11-01 LAB — GLUCOSE, CAPILLARY
Glucose-Capillary: 114 mg/dL — ABNORMAL HIGH (ref 70–99)
Glucose-Capillary: 153 mg/dL — ABNORMAL HIGH (ref 70–99)
Glucose-Capillary: 157 mg/dL — ABNORMAL HIGH (ref 70–99)
Glucose-Capillary: 161 mg/dL — ABNORMAL HIGH (ref 70–99)
Glucose-Capillary: 88 mg/dL (ref 70–99)
Glucose-Capillary: 99 mg/dL (ref 70–99)

## 2019-11-01 LAB — BASIC METABOLIC PANEL
Anion gap: 12 (ref 5–15)
BUN: 11 mg/dL (ref 6–20)
CO2: 25 mmol/L (ref 22–32)
Calcium: 9.8 mg/dL (ref 8.9–10.3)
Chloride: 102 mmol/L (ref 98–111)
Creatinine, Ser: 0.55 mg/dL (ref 0.44–1.00)
GFR calc Af Amer: >60 mL/min (ref >60)
GFR calc non Af Amer: >60 mL/min (ref >60)
Glucose, Bld: 122 mg/dL — ABNORMAL HIGH (ref 70–99)
Potassium: 4 mmol/L (ref 3.5–5.1)
Sodium: 139 mmol/L (ref 135–145)

## 2019-11-01 LAB — CBC
HCT: 33.6 % — ABNORMAL LOW (ref 36.0–46.0)
Hemoglobin: 10.2 g/dL — ABNORMAL LOW (ref 12.0–15.0)
MCH: 25.4 pg — ABNORMAL LOW (ref 26.0–34.0)
MCHC: 30.4 g/dL (ref 30.0–36.0)
MCV: 83.8 fL (ref 80.0–100.0)
Platelets: 533 10*3/uL — ABNORMAL HIGH (ref 150–400)
RBC: 4.01 MIL/uL (ref 3.87–5.11)
RDW: 17.6 % — ABNORMAL HIGH (ref 11.5–15.5)
WBC: 9.4 10*3/uL (ref 4.0–10.5)
nRBC: 0 % (ref 0.0–0.2)

## 2019-11-01 MED ORDER — ALPRAZOLAM 0.5 MG PO TABS
0.5000 mg | ORAL_TABLET | Freq: Every day | ORAL | Status: AC
  Administered 2019-11-01: 0.5 mg via ORAL
  Filled 2019-11-01: qty 1

## 2019-11-01 NOTE — Progress Notes (Signed)
Called to assess PICC line d/t flushing but no blood return. Caps and dressing changed. Able to draw labs, however sluggish blood return. RN at bedside. Patient not getting any medications IV route at this time. PICC was TPA'd 3 days ago. Recommend a chest xray vs line removal if still having trouble drawing blood.

## 2019-11-01 NOTE — Progress Notes (Signed)
RT removed pt's trach per order. Pt tolerated well and VSS. RN was made aware as well.

## 2019-11-01 NOTE — Progress Notes (Signed)
Pt mother requests discontinuing air mattress.  She states pt told her the bed was hurting her back.  MD aware.  Will d/c

## 2019-11-01 NOTE — Progress Notes (Signed)
PROGRESS NOTE    Beverly [name]  FXT:[id number] DOB: 08-10-1985 DOA: 09/24/2019 PCP: Patient, No Pcp Per    Brief Narrative: 34 year old female presents to the hospital 2 weeks postpartum with chest pain and in the ED sustained V. fib arrest with V. fib x3 epi x1, EKG showed ST elevation underwent Cath Lab at [facility] showed aneurysmal dilation(dissection?) of the LAD was identified. She was transferred to [facility] for ICU admission.LVEF 30% EF in cath lab.  Echo 8/18>LVEF 30-35%, septal and apical akinesis distal anterior wall an d mid/distal inferior wall hypokinesis hyperdynamic basal function. LV size mildly dilated.  Patient suffered anoxic brain injury. 8.23:MRI brain  8/30 underwent tracheostomy 8/30 PEG tube placement 9/10: Continues to be encephalopathic, able to open eyes and track but not following any commands and not talking,is moving extremities independently 9/15: Tracheostomy downsized to # 4 cufless. 9/20:Trach CAP trial, PCCM following SNF planned for 9/30- 30 days out from trach 9/21:Patient is showing improvement with the speech therapy and PT-recommendation changed from SNF to inpatient rehab.   Subjective:  With poor max assist patient was able to be transferred to the chair yesterday Nursing reports with Xanax 0.5 mg she slept well 9/23 PICC not working well although slowly abel to get blod out picc tema in room recommending removing it and leaving iv out, difficult to stick Pt is alert,awake, follows minimal commands  Assessment & Plan:  V. fib arrest due to anterior MI status post cardiac cath, found to have aneurysmal proximal LAD with dissection likely culprit. Cardiology has signed off, continue with Aspirin 81 mg, Lipitor 80 mg, Coreg 6.25 bid.  Aldactone 25 mg, digoxin 0.125 mg, Entresto 24-26 bid, cardio signed off 2/99  Acute systolic congestive heart failure with EF 35%: 2/2 MI. Remains euvolemic, Euvolemic. Admission  weight 227 pound  currently weight at 157.  Cont on  beta-blocker Entresto Aldactone  and digoxin as above  Acute encephalopathy/confusion, possible anoxic brain injury: Due to #1.  Had extensive work-up with EEG, MRI brain.  Patient has been trach and PEG dependent. Successfully tolerated trach capping and plan for decannulation soon per PCCM- defer to PCCM. On bolus feed via PEG.  Slowly improving mental status.  Cont melatonin for sleep, seen by psych due to ongoing agitation and insomnia initiate Trileptal but patient's mother refusing. She responded well with 0.5 mg Xanax at bedtime for sleep.  Hypokalemia/hypomagnesemia: Monitor intermittently.  Hypertension: BP at times soft several days ago so dose of Entresto was lowered. Continue current meds and monitor.   History of SVT on NSR, continue Coreg.  Diarrhea off Flexi-Seal.resolved.  ?Aspiration pneumonia completed antibiotics previously  DVT prophylaxis: enoxaparin (LOVENOX) injection 40 mg Start: 10/07/19 1000 Place and maintain sequential compression device Start: 09/24/19 0136 Code Status:   Code Status: Full Code  Family Communication: plan of care discussed with patient/s mother at the bedside again today.  Status is: Inpatient Remains inpatient appropriate because:Inpatient level of care appropriate due to severity of illness and For ongoing management of tracheostomy as per pulmonary  Dispo: The patient is from: Home              Anticipated d/c is to: CIR consulted 9/21-awaiting for approval.              Anticipated d/c date is: once CIR available.               Patient currently is not medically stable for discharge.  Nutrition: Diet Order  Diet clear liquid Room service appropriate? No; Fluid consistency: Thin  Diet effective now                 Nutrition Problem: Increased nutrient needs Etiology: post-op healing Signs/Symptoms: estimated needs Interventions: Tube feeding Body mass index is 22.53  kg/m.  Consultants:see note  Procedures:see note Microbiology:see note Blood Culture    Component Value Date/Time   SDES URINE, RANDOM 10/08/2019 1316   SPECREQUEST NONE 10/08/2019 1316   CULT  10/08/2019 1316    NO GROWTH Performed at [facility] [address] [place], [place] [address]    REPTSTATUS 10/09/2019 FINAL 10/08/2019 1316    Other culture-see note  Medications: Scheduled Meds: . aspirin  81 mg Per Tube Daily  . atorvastatin  80 mg Per Tube Daily  . carvedilol  6.25 mg Per Tube BID WC  . chlorhexidine gluconate (MEDLINE KIT)  15 mL Mouth Rinse BID  . Chlorhexidine Gluconate Cloth  6 each Topical Daily  . digoxin  0.125 mg Per Tube Daily  . enoxaparin (LOVENOX) injection  40 mg Subcutaneous Q24H  . feeding supplement (OSMOLITE 1.5 CAL)  237 mL Per Tube 6 X Daily  . feeding supplement (PROSource TF)  45 mL Per Tube TID  . influenza vac split quadrivalent PF  0.5 mL Intramuscular Tomorrow-1000  . insulin aspart  0-15 Units Subcutaneous Q4H  . mouth rinse  15 mL Mouth Rinse 10 times per day  . melatonin  3 mg Oral QHS  . OXcarbazepine  75 mg Oral BID  . prenatal multivitamin  1 tablet Per Tube Q1200  . sacubitril-valsartan  1 tablet Per Tube BID  . scopolamine  1 patch Transdermal Q72H  . sodium chloride flush  10-40 mL Intracatheter Q12H  . spironolactone  25 mg Per Tube Daily   Continuous Infusions: . sodium chloride 10 mL/hr at 10/16/19 1600    Antimicrobials: Anti-infectives (From admission, onward)   Start     Dose/Rate Route Frequency Ordered Stop   10/09/19 2100  vancomycin (VANCOREADY) IVPB 1500 mg/300 mL  Status:  Discontinued        1,500 mg 150 mL/hr over 120 Minutes Intravenous Every 12 hours 10/09/19 1237 10/10/19 1121   10/09/19 0800  ceFEPIme (MAXIPIME) 2 g in sodium chloride 0.9 % 100 mL IVPB        2 g 200 mL/hr over 30 Minutes Intravenous Every 8 hours 10/09/19 0752 10/15/19 2147   10/09/19 0800  vancomycin (VANCOREADY) IVPB  2000 mg/400 mL        2,000 mg 200 mL/hr over 120 Minutes Intravenous  Once 10/09/19 0752 10/09/19 1147   10/07/19 1540  ceFAZolin (ANCEF) 2-4 GM/100ML-% IVPB       Note to Pharmacy: [name] Manges   : cabinet override      10/07/19 1540 10/08/19 0344   10/07/19 1400  ceFAZolin (ANCEF) IVPB 2g/100 mL premix        2 g 200 mL/hr over 30 Minutes Intravenous To Radiology 10/06/19 1339 10/07/19 1627   09/30/19 0300  vancomycin (VANCOCIN) IVPB 1000 mg/200 mL premix  Status:  Discontinued        1,000 mg 200 mL/hr over 60 Minutes Intravenous Every 8 hours 09/29/19 1809 10/01/19 0853   09/29/19 1815  ceFEPIme (MAXIPIME) 2 g in sodium chloride 0.9 % 100 mL IVPB        2 g 200 mL/hr over 30 Minutes Intravenous Every 8 hours 09/29/19 1803 10/05/19 1801  09/29/19 1815  vancomycin (VANCOREADY) IVPB 2000 mg/400 mL        2,000 mg 200 mL/hr over 120 Minutes Intravenous  Once 09/29/19 1803 09/29/19 2137   09/24/19 2200  cefTRIAXone (ROCEPHIN) 2 g in sodium chloride 0.9 % 100 mL IVPB        2 g 200 mL/hr over 30 Minutes Intravenous Daily at bedtime 09/24/19 1201 09/28/19 2338   09/24/19 0330  cefTRIAXone (ROCEPHIN) 1 g in sodium chloride 0.9 % 100 mL IVPB  Status:  Discontinued        1 g 200 mL/hr over 30 Minutes Intravenous Daily at bedtime 09/24/19 0245 09/24/19 1201   09/24/19 0300  azithromycin (ZITHROMAX) 500 mg in sodium chloride 0.9 % 250 mL IVPB  Status:  Discontinued        500 mg 250 mL/hr over 60 Minutes Intravenous Daily at bedtime 09/24/19 0245 09/24/19 1103     Objective: Vitals: Today's Vitals   11/01/19 0113 11/01/19 0419 11/01/19 0810 11/01/19 0812  BP:  102/87 103/65 103/65  Pulse: 99 92 (!) 101 85  Resp: (!) 21 19  18   Temp:  98 F (36.7 C) 98.1 F (36.7 C)   TempSrc:  Oral Oral   SpO2: 100% 99% 100% 100%  Weight:      Height:      PainSc:       No intake or output data in the 24 hours ending 11/01/19 0946 Filed Weights   10/28/19 0637 10/29/19 0320 10/30/19 0500   Weight: 73.4 kg 73.5 kg 71.2 kg   Weight change:   Intake/Output from previous day: No intake/output data recorded. Intake/Output this shift: No intake/output data recorded.  Examination:  General exam: AA, tracks, follows mild commands. On RA HEENT:Oral mucosa moist, Ear/Nose WNL grossly, dentition normal. Respiratory system: bilaterally clear,no wheezing or crackles,no use of accessory muscle. Trach capped. Cardiovascular system: S1 & S2 +, No JVD,. Gastrointestinal system: Abdomen soft, NT, PEG+, ND, BS+ Nervous System:Alert, awake, moving all her extremities on her own and grossly nonfocal. Confused still. Extremities: No edema, distal peripheral pulses palpable.  Skin: No rashes,no icterus. MSK: Normal muscle bulk,tone, power  Data Reviewed: I have personally reviewed following labs and imaging studies CBC: Recent Labs  Lab 10/29/19 0345  WBC 8.8  HGB 11.1*  HCT 36.1  MCV 84.1  PLT 147*   Basic Metabolic Panel: Recent Labs  Lab 10/27/19 0500 10/29/19 0345  NA 137 137  K 3.8 4.0  CL 100 103  CO2 26 26  GLUCOSE 145* 148*  BUN 14 19  CREATININE 0.56 0.56  CALCIUM 9.9 9.8  MG 2.0 2.0   GFR: Estimated Creatinine Clearance: 109.2 mL/min (by C-G formula based on SCr of 0.56 mg/dL). Liver Function Tests: Recent Labs  Lab 10/29/19 0345  AST 47*  ALT 84*  ALKPHOS 129*  BILITOT 0.5  PROT 8.0  ALBUMIN 3.4*   No results for input(s): LIPASE, AMYLASE in the last 168 hours. No results for input(s): AMMONIA in the last 168 hours. Coagulation Profile: No results for input(s): INR, PROTIME in the last 168 hours. Cardiac Enzymes: No results for input(s): CKTOTAL, CKMB, CKMBINDEX, TROPONINI in the last 168 hours. BNP (last 3 results) No results for input(s): PROBNP in the last 8760 hours. HbA1C: No results for input(s): HGBA1C in the last 72 hours. CBG: Recent Labs  Lab 10/31/19 1659 10/31/19 2110 11/01/19 0026 11/01/19 0422 11/01/19 0807  GLUCAP 120*  106* 161* 88 157*   Lipid Profile:  No results for input(s): CHOL, HDL, LDLCALC, TRIG, CHOLHDL, LDLDIRECT in the last 72 hours. Thyroid Function Tests: No results for input(s): TSH, T4TOTAL, FREET4, T3FREE, THYROIDAB in the last 72 hours. Anemia Panel: No results for input(s): VITAMINB12, FOLATE, FERRITIN, TIBC, IRON, RETICCTPCT in the last 72 hours. Sepsis Labs: No results for input(s): PROCALCITON, LATICACIDVEN in the last 168 hours.  No results found for this or any previous visit (from the past 240 hour(s)).   Radiology Studies: No results found.   LOS: 85 days   Antonieta Pert, MD [facility]  11/01/2019, 9:46 AM

## 2019-11-02 LAB — CBC
HCT: 34.4 % — ABNORMAL LOW (ref 36.0–46.0)
Hemoglobin: 10.5 g/dL — ABNORMAL LOW (ref 12.0–15.0)
MCH: 25.7 pg — ABNORMAL LOW (ref 26.0–34.0)
MCHC: 30.5 g/dL (ref 30.0–36.0)
MCV: 84.3 fL (ref 80.0–100.0)
Platelets: 539 10*3/uL — ABNORMAL HIGH (ref 150–400)
RBC: 4.08 MIL/uL (ref 3.87–5.11)
RDW: 17.7 % — ABNORMAL HIGH (ref 11.5–15.5)
WBC: 8.7 10*3/uL (ref 4.0–10.5)
nRBC: 0 % (ref 0.0–0.2)

## 2019-11-02 LAB — BASIC METABOLIC PANEL
Anion gap: 11 (ref 5–15)
BUN: 12 mg/dL (ref 6–20)
CO2: 23 mmol/L (ref 22–32)
Calcium: 9.6 mg/dL (ref 8.9–10.3)
Chloride: 105 mmol/L (ref 98–111)
Creatinine, Ser: 0.49 mg/dL (ref 0.44–1.00)
GFR calc Af Amer: >60 mL/min (ref >60)
GFR calc non Af Amer: >60 mL/min (ref >60)
Glucose, Bld: 93 mg/dL (ref 70–99)
Potassium: 4.3 mmol/L (ref 3.5–5.1)
Sodium: 139 mmol/L (ref 135–145)

## 2019-11-02 LAB — GLUCOSE, CAPILLARY
Glucose-Capillary: 113 mg/dL — ABNORMAL HIGH (ref 70–99)
Glucose-Capillary: 120 mg/dL — ABNORMAL HIGH (ref 70–99)
Glucose-Capillary: 131 mg/dL — ABNORMAL HIGH (ref 70–99)
Glucose-Capillary: 136 mg/dL — ABNORMAL HIGH (ref 70–99)
Glucose-Capillary: 88 mg/dL (ref 70–99)

## 2019-11-02 MED ORDER — ALPRAZOLAM 0.5 MG PO TABS
0.5000 mg | ORAL_TABLET | Freq: Every day | ORAL | Status: AC
  Administered 2019-11-02: 0.5 mg via ORAL
  Filled 2019-11-02: qty 1

## 2019-11-02 MED ORDER — ACETAMINOPHEN 160 MG/5ML PO SOLN
650.0000 mg | ORAL | Status: DC | PRN
  Administered 2019-11-03 – 2019-11-08 (×6): 650 mg via ORAL
  Filled 2019-11-02 (×7): qty 20.3

## 2019-11-02 NOTE — Progress Notes (Signed)
PROGRESS NOTE    Beverly [name]  PFX:[id number] DOB: 01-23-1986 DOA: 09/24/2019 PCP: Patient, No Pcp Per    Brief Narrative: 34 year old female presents to the hospital 2 weeks postpartum with chest pain and in the ED sustained V. fib arrest with V. fib x3 epi x1, EKG showed ST elevation underwent Cath Lab at [facility] showed aneurysmal dilation(dissection?) of the LAD was identified. She was transferred to [facility] for ICU admission.LVEF 30% EF in cath lab.  Echo 8/18>LVEF 30-35%, septal and apical akinesis distal anterior wall an d mid/distal inferior wall hypokinesis hyperdynamic basal function. LV size mildly dilated.  Patient suffered anoxic brain injury. 8.23:MRI brain  8/30 underwent tracheostomy 8/30 PEG tube placement 9/10: Continues to be encephalopathic, able to open eyes and track but not following any commands and not talking,is moving extremities independently 9/15: Tracheostomy downsized to # 4 cufless. 9/20:Trach CAP trial, PCCM following SNF planned for 9/30- 30 days out from trach 9/21:Patient is showing improvement with the speech therapy and PT-recommendation changed from SNF to inpatient rehab.  9/25: Trach removed.  Subjective:  Afebrile.  Lurline Idol was removed 9/25. She is alert awake.  She smiled at me, voice is muffled but able to tell me her name " I am too sleepy" She slept well after 0.5 mg Xanax last night  Assessment & Plan:  V. fib arrest due to anterior MI status post cardiac cath, found to have aneurysmal proximal LAD with dissection likely culprit. Cardiology has signed off.  Per recommendation continue on aspirin 81, Lipitor 80,Coreg 6.25 bid,Aldactone 25 mg, digoxin 0.125 mg, Entresto 24-26 bid.Cardio signed off 3/29  Acute systolic congestive heart failure with EF 35%: 2/2 MI.  Patient is euvolemic.   Wt 191 lb, seems up, admission weight 227.Cont on  beta-blocker Entresto Aldactone  and digoxin as above  Acute encephalopathy/confusion,  possible anoxic brain injury: Due to #1.  Had extensive work-up with EEG, MRI brain.  Patient has been trach and PEG dependent. Successfully tolerated trach capping and plan for decannulation soon per PCCM- defer to PCCM. On bolus feed via PEG.  Slowly improving mental status.  Cont melatonin for sleep, seen by psych due to ongoing agitation and insomnia initiated Trileptal but patient's mother refusing it.She responded well with 0.5 mg Xanax at bedtime for sleep.  Hypokalemia/hypomagnesemia: Monitor intermittently.  Hypertension: BPs fairly stable in 90s to 100s. Dose of Entresto was lowered for soft BP.Continue current meds and monitor.   History of SVT on NSR,continue Coreg.  Diarrhea off Flexi-Seal. Resolved.  ?Aspiration pneumonia completed antibiotics previously  DVT prophylaxis: enoxaparin (LOVENOX) injection 40 mg Start: 10/07/19 1000 Place and maintain sequential compression device Start: 09/24/19 0136 Code Status:   Code Status: Full Code  Family Communication: plan of care discussed with patient/s mother at the bedside again today.  Status is: Inpatient Remains inpatient appropriate because:Inpatient level of care appropriate due to severity of illness and For ongoing management of tracheostomy as per pulmonary  Dispo: The patient is from: Home              Anticipated d/c is to: CIR consulted 9/21-awaiting on inpatient rehab              Anticipated d/c date is: once CIR available.               Patient currently is not medically stable for discharge.  Nutrition: Diet Order            Diet clear liquid Room  service appropriate? No; Fluid consistency: Thin  Diet effective now                 Nutrition Problem: Increased nutrient needs Etiology: post-op healing Signs/Symptoms: estimated needs Interventions: Tube feeding Body mass index is 27.52 kg/m.  Consultants:see note  Procedures:see note Microbiology:see note Blood Culture    Component Value Date/Time    SDES URINE, RANDOM 10/08/2019 1316   SPECREQUEST NONE 10/08/2019 1316   CULT  10/08/2019 1316    NO GROWTH Performed at [facility] [address], [place], [place] [address]    REPTSTATUS 10/09/2019 FINAL 10/08/2019 1316    Other culture-see note  Medications: Scheduled Meds: . aspirin  81 mg Per Tube Daily  . atorvastatin  80 mg Per Tube Daily  . carvedilol  6.25 mg Per Tube BID WC  . chlorhexidine gluconate (MEDLINE KIT)  15 mL Mouth Rinse BID  . Chlorhexidine Gluconate Cloth  6 each Topical Daily  . digoxin  0.125 mg Per Tube Daily  . enoxaparin (LOVENOX) injection  40 mg Subcutaneous Q24H  . feeding supplement (OSMOLITE 1.5 CAL)  237 mL Per Tube 6 X Daily  . feeding supplement (PROSource TF)  45 mL Per Tube TID  . influenza vac split quadrivalent PF  0.5 mL Intramuscular Tomorrow-1000  . insulin aspart  0-15 Units Subcutaneous Q4H  . melatonin  3 mg Oral QHS  . OXcarbazepine  75 mg Oral BID  . prenatal multivitamin  1 tablet Per Tube Q1200  . sacubitril-valsartan  1 tablet Per Tube BID  . sodium chloride flush  10-40 mL Intracatheter Q12H  . spironolactone  25 mg Per Tube Daily   Continuous Infusions: . sodium chloride 10 mL/hr at 10/16/19 1600    Antimicrobials: Anti-infectives (From admission, onward)   Start     Dose/Rate Route Frequency Ordered Stop   10/09/19 2100  vancomycin (VANCOREADY) IVPB 1500 mg/300 mL  Status:  Discontinued        1,500 mg 150 mL/hr over 120 Minutes Intravenous Every 12 hours 10/09/19 1237 10/10/19 1121   10/09/19 0800  ceFEPIme (MAXIPIME) 2 g in sodium chloride 0.9 % 100 mL IVPB        2 g 200 mL/hr over 30 Minutes Intravenous Every 8 hours 10/09/19 0752 10/15/19 2147   10/09/19 0800  vancomycin (VANCOREADY) IVPB 2000 mg/400 mL        2,000 mg 200 mL/hr over 120 Minutes Intravenous  Once 10/09/19 0752 10/09/19 1147   10/07/19 1540  ceFAZolin (ANCEF) 2-4 GM/100ML-% IVPB       Note to Pharmacy: [name] Manges   : cabinet override        10/07/19 1540 10/08/19 0344   10/07/19 1400  ceFAZolin (ANCEF) IVPB 2g/100 mL premix        2 g 200 mL/hr over 30 Minutes Intravenous To Radiology 10/06/19 1339 10/07/19 1627   09/30/19 0300  vancomycin (VANCOCIN) IVPB 1000 mg/200 mL premix  Status:  Discontinued        1,000 mg 200 mL/hr over 60 Minutes Intravenous Every 8 hours 09/29/19 1809 10/01/19 0853   09/29/19 1815  ceFEPIme (MAXIPIME) 2 g in sodium chloride 0.9 % 100 mL IVPB        2 g 200 mL/hr over 30 Minutes Intravenous Every 8 hours 09/29/19 1803 10/05/19 1801   09/29/19 1815  vancomycin (VANCOREADY) IVPB 2000 mg/400 mL        2,000 mg 200 mL/hr over 120 Minutes Intravenous  Once 09/29/19 1803 09/29/19 2137   09/24/19 2200  cefTRIAXone (ROCEPHIN) 2 g in sodium chloride 0.9 % 100 mL IVPB        2 g 200 mL/hr over 30 Minutes Intravenous Daily at bedtime 09/24/19 1201 09/28/19 2338   09/24/19 0330  cefTRIAXone (ROCEPHIN) 1 g in sodium chloride 0.9 % 100 mL IVPB  Status:  Discontinued        1 g 200 mL/hr over 30 Minutes Intravenous Daily at bedtime 09/24/19 0245 09/24/19 1201   09/24/19 0300  azithromycin (ZITHROMAX) 500 mg in sodium chloride 0.9 % 250 mL IVPB  Status:  Discontinued        500 mg 250 mL/hr over 60 Minutes Intravenous Daily at bedtime 09/24/19 0245 09/24/19 1103     Objective: Vitals: Today's Vitals   11/01/19 2004 11/02/19 0001 11/02/19 0415 11/02/19 0500  BP: 123/63 101/69 101/62   Pulse: 100 84 80   Resp: 18 16 18    Temp: 98.6 F (37 C) 98.4 F (36.9 C) 98.2 F (36.8 C)   TempSrc: Axillary Axillary Axillary   SpO2: 99% 100% 99%   Weight:    87 kg  Height:      PainSc: 0-No pain       Intake/Output Summary (Last 24 hours) at 11/02/2019 0745 Last data filed at 11/01/2019 1300 Gross per 24 hour  Intake 100 ml  Output --  Net 100 ml   Filed Weights   10/29/19 0320 10/30/19 0500 11/02/19 0500  Weight: 73.5 kg 71.2 kg 87 kg   Weight change:   Intake/Output from previous day: 09/25 0701  - 09/26 0700 In: 100 [P.O.:100] Out: -  Intake/Output this shift: No intake/output data recorded.  Examination:  General exam: AA follows some commands, voice muffled, old for age, not in distress HEENT:Oral mucosa moist, Ear/Nose WNL grossly, dentition normal. Respiratory system: bilaterally clear,no wheezing or crackles,no use of accessory muscle.  Trach site with dressing intact Cardiovascular system: S1 & S2 +, No JVD,. Gastrointestinal system: Abdomen soft, NT,ND, BS+ Nervous System:Alert, awake, moving extremities on her own, follows some commands  Extremities: No edema, distal peripheral pulses palpable.  Skin: No rashes,no icterus. MSK: Normal muscle bulk,tone, power  Data Reviewed: I have personally reviewed following labs and imaging studies CBC: Recent Labs  Lab 10/29/19 0345 11/01/19 1050 11/02/19 0202  WBC 8.8 9.4 8.7  HGB 11.1* 10.2* 10.5*  HCT 36.1 33.6* 34.4*  MCV 84.1 83.8 84.3  PLT 604* 533* 017*   Basic Metabolic Panel: Recent Labs  Lab 10/27/19 0500 10/29/19 0345 11/01/19 0914 11/02/19 0202  NA 137 137 139 139  K 3.8 4.0 4.0 4.3  CL 100 103 102 105  CO2 26 26 25 23   GLUCOSE 145* 148* 122* 93  BUN 14 19 11 12   CREATININE 0.56 0.56 0.55 0.49  CALCIUM 9.9 9.8 9.8 9.6  MG 2.0 2.0  --   --    GFR: Estimated Creatinine Clearance: 121 mL/min (by C-G formula based on SCr of 0.49 mg/dL). Liver Function Tests: Recent Labs  Lab 10/29/19 0345  AST 47*  ALT 84*  ALKPHOS 129*  BILITOT 0.5  PROT 8.0  ALBUMIN 3.4*   No results for input(s): LIPASE, AMYLASE in the last 168 hours. No results for input(s): AMMONIA in the last 168 hours. Coagulation Profile: No results for input(s): INR, PROTIME in the last 168 hours. Cardiac Enzymes: No results for input(s): CKTOTAL, CKMB, CKMBINDEX, TROPONINI in the last 168 hours. BNP (last 3  results) No results for input(s): PROBNP in the last 8760 hours. HbA1C: No results for input(s): HGBA1C in the last 72  hours. CBG: Recent Labs  Lab 11/01/19 1126 11/01/19 1649 11/01/19 2007 11/02/19 0002 11/02/19 0417  GLUCAP 153* 99 114* 131* 88   Lipid Profile: No results for input(s): CHOL, HDL, LDLCALC, TRIG, CHOLHDL, LDLDIRECT in the last 72 hours. Thyroid Function Tests: No results for input(s): TSH, T4TOTAL, FREET4, T3FREE, THYROIDAB in the last 72 hours. Anemia Panel: No results for input(s): VITAMINB12, FOLATE, FERRITIN, TIBC, IRON, RETICCTPCT in the last 72 hours. Sepsis Labs: No results for input(s): PROCALCITON, LATICACIDVEN in the last 168 hours.  No results found for this or any previous visit (from the past 240 hour(s)).   Radiology Studies: No results found.   LOS: 63 days   Antonieta Pert, MD [facility]  11/02/2019, 7:45 AM

## 2019-11-03 LAB — GLUCOSE, CAPILLARY
Glucose-Capillary: 105 mg/dL — ABNORMAL HIGH (ref 70–99)
Glucose-Capillary: 112 mg/dL — ABNORMAL HIGH (ref 70–99)
Glucose-Capillary: 113 mg/dL — ABNORMAL HIGH (ref 70–99)
Glucose-Capillary: 114 mg/dL — ABNORMAL HIGH (ref 70–99)
Glucose-Capillary: 121 mg/dL — ABNORMAL HIGH (ref 70–99)
Glucose-Capillary: 141 mg/dL — ABNORMAL HIGH (ref 70–99)
Glucose-Capillary: 89 mg/dL (ref 70–99)

## 2019-11-03 MED ORDER — ALPRAZOLAM 0.5 MG PO TABS
0.5000 mg | ORAL_TABLET | Freq: Once | ORAL | Status: AC
  Administered 2019-11-03: 0.5 mg via ORAL
  Filled 2019-11-03: qty 1

## 2019-11-03 NOTE — Progress Notes (Signed)
Physical Therapy Treatment Patient Details Name: Beverly [name] MRN: [id number] DOB: [birth year] Today's Date: 11/03/2019    History of Present Illness Pt is a 34 y.o. female admitted 09/23/19 with intermittent chest discomfort; of note, pt 2-weeks post-partum (had emergent C-section); while in ED, pt with VF cardiac arrest requiring intubation. Brain MRI 8/23 with no acute abnormality. S/p trach and PEG tube 8/30. Course complicated by encephalopathy, anoxic brain injury, enterobacterial PNA. Transferred out of ICU On 9/4. PMH includes HTN.    PT Comments    The pt is continuing to progress well with therapy goals at this time. She continues to require modA of 2 to complete bed mobility and sit-stand from the EOB with use of a stedy for safety and to maintain upright both seated and in standing position. The pt practiced multiple sit-stand transfers from various surfaces and requires modA of 2 to stand from the EOB, but minA of 2 only to stand from elevated surfaces such as stedy pads. The pt was able to complete significantly more activity (x3 sit-stands, 3-5 min bouts of standing, and standing wt shifts) with HR 110-120s this session, but it did increase to 140bpm with continued activity in standing position. The pt continues to be an excellent candidate for intensive therapies due to her good family support, motivation, and high prior level of function.     Follow Up Recommendations  CIR     Equipment Recommendations  Other (comment) (defer to post acute)    Recommendations for Other Services       Precautions / Restrictions Precautions Precautions: Fall Precaution Comments: peg, incontinence Restrictions Weight Bearing Restrictions: No    Mobility  Bed Mobility Overal bed mobility: Needs Assistance Bed Mobility: Supine to Sit Rolling: Max assist   Supine to sit: +2 for physical assistance;Mod assist Sit to supine: Max assist   General bed mobility comments: cues and max  assist to roll for changing bed pad and pericare, pt able to bridge breifly with minimal clearance to assist with donning brief, requires assist for LEs over EOB and to raise trunk to achieve sitting  Transfers Overall transfer level: Needs assistance Equipment used: Ambulation equipment used Transfers: Sit to/from Stand Sit to Stand: +2 physical assistance;Mod assist;Min assist         General transfer comment: +2 mod assist to rise and steady from bed to stedy, +2 min to rise from stedy platforms  Ambulation/Gait             General Gait Details: use of stedy for transfer at this time, maxA in stedy for wt shifting      Balance Overall balance assessment: Needs assistance Sitting-balance support: Bilateral upper extremity supported;Feet supported Sitting balance-Leahy Scale: Poor Sitting balance - Comments: max/mod assist, min guard briefly   Standing balance support: Bilateral upper extremity supported Standing balance-Leahy Scale: Poor Standing balance comment: hands placed on stedy cross bar, +2 min assist at pelvis and shoulders                            Cognition Arousal/Alertness: Awake/alert Behavior During Therapy: Flat affect (smiling at times) Overall Cognitive Status: Impaired/Different from baseline Area of Impairment: Attention;Following commands;Safety/judgement;Awareness;Problem solving                   Current Attention Level: Focused   Following Commands: Follows one step commands inconsistently;Follows one step commands with increased time Safety/Judgement: Decreased awareness of safety;Decreased awareness of  deficits Awareness: Intellectual Problem Solving: Decreased initiation;Difficulty sequencing;Requires verbal cues;Requires tactile cues;Slow processing General Comments: pt nodding her head slightly when responding to yes/no questions and inconsistently, pt responding favorably to use of music by [facility] during session       Exercises General Exercises - Lower Extremity Ankle Circles/Pumps: AROM;PROM;Both;10 reps;Supine Heel Slides: Both;10 reps;AAROM;Supine    General Comments General comments (skin integrity, edema, etc.): responding well with music by beyonce      Pertinent Vitals/Pain Pain Assessment: Faces Faces Pain Scale: No hurt Pain Intervention(s): Monitored during session;Repositioned           PT Goals (current goals can now be found in the care plan section) Acute Rehab PT Goals Patient Stated Goal: unable to state PT Goal Formulation: Patient unable to participate in goal setting Time For Goal Achievement: 11/13/19 Potential to Achieve Goals: Fair Additional Goals Additional Goal #1: pt will attend to visual target 5 sec 3/5 trials Progress towards PT goals: Progressing toward goals    Frequency    Min 3X/week      PT Plan Current plan remains appropriate    Co-evaluation PT/OT/SLP Co-Evaluation/Treatment: Yes Reason for Co-Treatment: For patient/therapist safety;To address functional/ADL transfers;Necessary to address cognition/behavior during functional activity PT goals addressed during session: Mobility/safety with mobility;Balance;Strengthening/ROM OT goals addressed during session: ADL's and self-care;Strengthening/ROM      AM-PAC PT "6 Clicks" Mobility   Outcome Measure  Help needed turning from your back to your side while in a flat bed without using bedrails?: A Little Help needed moving from lying on your back to sitting on the side of a flat bed without using bedrails?: A Lot Help needed moving to and from a bed to a chair (including a wheelchair)?: Total Help needed standing up from a chair using your arms (e.g., wheelchair or bedside chair)?: A Lot Help needed to walk in hospital room?: Total Help needed climbing 3-5 steps with a railing? : Total 6 Click Score: 10    End of Session Equipment Utilized During Treatment: Gait belt Activity Tolerance:  Patient tolerated treatment well Patient left: with call bell/phone within reach;with family/visitor present;in chair;with chair alarm set Nurse Communication: Mobility status PT Visit Diagnosis: Other abnormalities of gait and mobility (R26.89);Difficulty in walking, not elsewhere classified (R26.2);Other symptoms and signs involving the nervous system (E56.314)     Time: 9702-6378 PT Time Calculation (min) (ACUTE ONLY): 36 min  Charges:  $Therapeutic Exercise: 8-22 mins                     Rolm Baptise, PT, DPT   Acute Rehabilitation Department Pager #: [phone]   Gaetana Michaelis 11/03/2019, 3:23 PM

## 2019-11-03 NOTE — Progress Notes (Signed)
Per MD, ok for patient to be without IV access.

## 2019-11-03 NOTE — Progress Notes (Signed)
PROGRESS NOTE    Beverly [name]  LYY:[id number] DOB: 1985-07-22 DOA: 09/24/2019 PCP: Patient, No Pcp Per    Brief Narrative: 34 year old female presents to the hospital 2 weeks postpartum with chest pain and in the ED sustained V. fib arrest with V. fib x3 epi x1, EKG showed ST elevation underwent Cath Lab at [facility] showed aneurysmal dilation(dissection?) of the LAD was identified. She was transferred to [facility] for ICU admission.LVEF 30% EF in cath lab.  Echo 8/18>LVEF 30-35%, septal and apical akinesis distal anterior wall an d mid/distal inferior wall hypokinesis hyperdynamic basal function. LV size mildly dilated.  Patient suffered anoxic brain injury. 8.23:MRI brain  8/30 underwent tracheostomy 8/30 PEG tube placement 9/10: Continues to be encephalopathic, able to open eyes and track but not following any commands and not talking,is moving extremities independently 9/15: Tracheostomy downsized to # 4 cufless. 9/20:Trach CAP trial, PCCM following SNF planned for 9/30- 30 days out from trach 9/21:Patient is showing improvement with the speech therapy and PT-recommendation changed from SNF to inpatient rehab.  9/25: Trach removed.  Subjective: Patient is afebrile. Hemodynamically stable blood sugar stable Mother at bedside slept well w/ xanax low dose, still sleeping. PICC line has been out no IV access 2/2 difficult access  Assessment & Plan:  V. fib arrest due to anterior MI status post cardiac cath, found to have aneurysmal proximal LAD with dissection likely culprit. Cardiology has signed off and per recommendation continue with current medical therapy consisting aspirin 81, Lipitor 80,Coreg 6.25 bid,Aldactone 25 mg, digoxin 0.125 mg, Entresto 24-26 bid.Cardio signed off 1/27  Acute systolic congestive heart failure with EF 35%: 2/2 MI.  Weight seems to be trending up.  Monitor closely.Cont on coreg,Entresto Aldactone  and digoxin as above.  Respiratory status is  stable.  Acute encephalopathy/confusion, possible anoxic brain injury: Due to #1.  Had extensive work-up with EEG, MRI brain.  Patient has been trach and PEG dependent. Successfully tolerated trach capping and plan for decannulation soon per PCCM- defer to PCCM. On bolus feed via PEG.  Slowly improving mental status.  Cont melatonin for sleep, seen by psych due to ongoing agitation and insomnia initiated Trileptal but patient's mother refusing it.She responded well with 0.5 mg Xanax at bedtime for sleep.  Hypokalemia/hypomagnesemia: Labs are normal.  Hypertension: BPs fairly stable in 90s to 100s. Dose of Entresto was lowered for soft BP continue Coreg and Aldactone and monitor.  History of SVT on NSR,continue Coreg.  Diarrhea off Flexi-Seal. Resolved.  ?Aspiration pneumonia completed antibiotics previously  DVT prophylaxis: enoxaparin (LOVENOX) injection 40 mg Start: 10/07/19 1000 Place and maintain sequential compression device Start: 09/24/19 0136 Code Status:   Code Status: Full Code  Family Communication: plan of care discussed with patient/s mother at the bedside multiple times previously.  Updated mother at the bedside.  Status is: Inpatient Remains inpatient appropriate because:Inpatient level of care appropriate due to severity of illness and For ongoing management of tracheostomy as per pulmonary  Dispo: The patient is from: Home              Anticipated d/c is to: CIR consulted 9/2, CIR vs SNF              Anticipated d/c date is: once BED available.               Patient currently is not medically stable for discharge.  Nutrition: Diet Order            Diet clear  liquid Room service appropriate? No; Fluid consistency: Thin  Diet effective now                 Nutrition Problem: Increased nutrient needs Etiology: post-op healing Signs/Symptoms: estimated needs Interventions: Tube feeding Body mass index is 27.71 kg/m.  Consultants:see note  Procedures:see  note Microbiology:see note Blood Culture    Component Value Date/Time   SDES URINE, RANDOM 10/08/2019 1316   SPECREQUEST NONE 10/08/2019 1316   CULT  10/08/2019 1316    NO GROWTH Performed at [facility] [address], [place], [place] [address]    REPTSTATUS 10/09/2019 FINAL 10/08/2019 1316    Other culture-see note  Medications: Scheduled Meds: . aspirin  81 mg Per Tube Daily  . atorvastatin  80 mg Per Tube Daily  . carvedilol  6.25 mg Per Tube BID WC  . chlorhexidine gluconate (MEDLINE KIT)  15 mL Mouth Rinse BID  . Chlorhexidine Gluconate Cloth  6 each Topical Daily  . digoxin  0.125 mg Per Tube Daily  . enoxaparin (LOVENOX) injection  40 mg Subcutaneous Q24H  . feeding supplement (OSMOLITE 1.5 CAL)  237 mL Per Tube 6 X Daily  . feeding supplement (PROSource TF)  45 mL Per Tube TID  . influenza vac split quadrivalent PF  0.5 mL Intramuscular Tomorrow-1000  . insulin aspart  0-15 Units Subcutaneous Q4H  . melatonin  3 mg Oral QHS  . prenatal multivitamin  1 tablet Per Tube Q1200  . sacubitril-valsartan  1 tablet Per Tube BID  . sodium chloride flush  10-40 mL Intracatheter Q12H  . spironolactone  25 mg Per Tube Daily   Continuous Infusions: . sodium chloride 10 mL/hr at 10/16/19 1600    Antimicrobials: Anti-infectives (From admission, onward)   Start     Dose/Rate Route Frequency Ordered Stop   10/09/19 2100  vancomycin (VANCOREADY) IVPB 1500 mg/300 mL  Status:  Discontinued        1,500 mg 150 mL/hr over 120 Minutes Intravenous Every 12 hours 10/09/19 1237 10/10/19 1121   10/09/19 0800  ceFEPIme (MAXIPIME) 2 g in sodium chloride 0.9 % 100 mL IVPB        2 g 200 mL/hr over 30 Minutes Intravenous Every 8 hours 10/09/19 0752 10/15/19 2147   10/09/19 0800  vancomycin (VANCOREADY) IVPB 2000 mg/400 mL        2,000 mg 200 mL/hr over 120 Minutes Intravenous  Once 10/09/19 0752 10/09/19 1147   10/07/19 1540  ceFAZolin (ANCEF) 2-4 GM/100ML-% IVPB       Note to  Pharmacy: [name] Manges   : cabinet override      10/07/19 1540 10/08/19 0344   10/07/19 1400  ceFAZolin (ANCEF) IVPB 2g/100 mL premix        2 g 200 mL/hr over 30 Minutes Intravenous To Radiology 10/06/19 1339 10/07/19 1627   09/30/19 0300  vancomycin (VANCOCIN) IVPB 1000 mg/200 mL premix  Status:  Discontinued        1,000 mg 200 mL/hr over 60 Minutes Intravenous Every 8 hours 09/29/19 1809 10/01/19 0853   09/29/19 1815  ceFEPIme (MAXIPIME) 2 g in sodium chloride 0.9 % 100 mL IVPB        2 g 200 mL/hr over 30 Minutes Intravenous Every 8 hours 09/29/19 1803 10/05/19 1801   09/29/19 1815  vancomycin (VANCOREADY) IVPB 2000 mg/400 mL        2,000 mg 200 mL/hr over 120 Minutes Intravenous  Once 09/29/19 1803 09/29/19 2137  09/24/19 2200  cefTRIAXone (ROCEPHIN) 2 g in sodium chloride 0.9 % 100 mL IVPB        2 g 200 mL/hr over 30 Minutes Intravenous Daily at bedtime 09/24/19 1201 09/28/19 2338   09/24/19 0330  cefTRIAXone (ROCEPHIN) 1 g in sodium chloride 0.9 % 100 mL IVPB  Status:  Discontinued        1 g 200 mL/hr over 30 Minutes Intravenous Daily at bedtime 09/24/19 0245 09/24/19 1201   09/24/19 0300  azithromycin (ZITHROMAX) 500 mg in sodium chloride 0.9 % 250 mL IVPB  Status:  Discontinued        500 mg 250 mL/hr over 60 Minutes Intravenous Daily at bedtime 09/24/19 0245 09/24/19 1103     Objective: Vitals: Today's Vitals   11/02/19 1205 11/02/19 1619 11/03/19 0023 11/03/19 0432  BP:  110/71 126/85 110/72  Pulse:  87 98 85  Resp:  18 18 18   Temp:  98.9 F (37.2 C) 98.3 F (36.8 C)   TempSrc:  Axillary Oral Oral  SpO2:  100% 100% 100%  Weight:    87.6 kg  Height:      PainSc: 0-No pain       Intake/Output Summary (Last 24 hours) at 11/03/2019 0736 Last data filed at 11/02/2019 1107 Gross per 24 hour  Intake 60 ml  Output --  Net 60 ml   Filed Weights   10/30/19 0500 11/02/19 0500 11/03/19 0432  Weight: 71.2 kg 87 kg 87.6 kg   Weight change: 0.6 kg  Intake/Output  from previous day: 09/26 0701 - 09/27 0700 In: 60 [P.O.:60] Out: -  Intake/Output this shift: No intake/output data recorded.  Examination: General exam: sleeping, NAD, weak appearing. HEENT:Oral mucosa moist, Ear/Nose WNL grossly, dentition normal.  Trach site with dressing intact Respiratory system: bilaterally clear,no wheezing or crackles,no use of accessory muscle Cardiovascular system: S1 & S2 +, No JVD,. Gastrointestinal system: Abdomen soft, NT,ND, BS+. PEG+ Nervous System: able to move her extremities,sleeping  Extremities: No edema, distal peripheral pulses palpable.  Skin: No rashes,no icterus. MSK: Normal muscle bulk,tone, power  Data Reviewed: I have personally reviewed following labs and imaging studies CBC: Recent Labs  Lab 10/29/19 0345 11/01/19 1050 11/02/19 0202  WBC 8.8 9.4 8.7  HGB 11.1* 10.2* 10.5*  HCT 36.1 33.6* 34.4*  MCV 84.1 83.8 84.3  PLT 604* 533* 440*   Basic Metabolic Panel: Recent Labs  Lab 10/29/19 0345 11/01/19 0914 11/02/19 0202  NA 137 139 139  K 4.0 4.0 4.3  CL 103 102 105  CO2 26 25 23   GLUCOSE 148* 122* 93  BUN 19 11 12   CREATININE 0.56 0.55 0.49  CALCIUM 9.8 9.8 9.6  MG 2.0  --   --    GFR: Estimated Creatinine Clearance: 121.3 mL/min (by C-G formula based on SCr of 0.49 mg/dL). Liver Function Tests: Recent Labs  Lab 10/29/19 0345  AST 47*  ALT 84*  ALKPHOS 129*  BILITOT 0.5  PROT 8.0  ALBUMIN 3.4*   No results for input(s): LIPASE, AMYLASE in the last 168 hours. No results for input(s): AMMONIA in the last 168 hours. Coagulation Profile: No results for input(s): INR, PROTIME in the last 168 hours. Cardiac Enzymes: No results for input(s): CKTOTAL, CKMB, CKMBINDEX, TROPONINI in the last 168 hours. BNP (last 3 results) No results for input(s): PROBNP in the last 8760 hours. HbA1C: No results for input(s): HGBA1C in the last 72 hours. CBG: Recent Labs  Lab 11/02/19 1147 11/02/19 1608 11/02/19  2039  11/03/19 0024 11/03/19 0434  GLUCAP 120* 136* 113* 112* 89   Lipid Profile: No results for input(s): CHOL, HDL, LDLCALC, TRIG, CHOLHDL, LDLDIRECT in the last 72 hours. Thyroid Function Tests: No results for input(s): TSH, T4TOTAL, FREET4, T3FREE, THYROIDAB in the last 72 hours. Anemia Panel: No results for input(s): VITAMINB12, FOLATE, FERRITIN, TIBC, IRON, RETICCTPCT in the last 72 hours. Sepsis Labs: No results for input(s): PROCALCITON, LATICACIDVEN in the last 168 hours.  No results found for this or any previous visit (from the past 240 hour(s)).   Radiology Studies: No results found.   LOS: 40 days   Antonieta Pert, MD [facility]  11/03/2019, 7:36 AM

## 2019-11-03 NOTE — Plan of Care (Signed)
Continue to monitor

## 2019-11-03 NOTE — Progress Notes (Signed)
SLP Cancellation Note  Patient Details Name: Beverly [name] MRN: [id number] DOB: Oct 22, 1985   Cancelled treatment:       Reason Eval/Treat Not Completed: Patient declined, no reason specified (Pt resting at this time and her mother requested that she be seen later. SLP will follow up later as schedule allows.)  Sol Englert I. Vear Clock, MS, CCC-SLP Acute Rehabilitation Services Office number [phone] Pager [phone]  [name] 11/03/2019, 8:54 AM

## 2019-11-03 NOTE — Progress Notes (Signed)
Occupational Therapy Treatment Patient Details Name: Beverly [name] MRN: [id number] DOB: 04-21-1985 Today's Date: 11/03/2019    History of present illness Pt is a 34 y.o. female admitted 09/23/19 with intermittent chest discomfort; of note, pt 2-weeks post-partum (had emergent C-section); while in ED, pt with VF cardiac arrest requiring intubation. Brain MRI 8/23 with no acute abnormality. S/p trach and PEG tube 8/30. Course complicated by encephalopathy, anoxic brain injury, enterobacterial PNA. Transferred out of ICU On 9/4. PMH includes HTN.   OT comments  Pt awake with mom at bedside. Assisted with pericare and change of brief, sitting balance at EOB, sit>stand and standing balance with assist of stedy and self feeding with cup/straw. Pt smiling at times and nodding head to indicate yes/no with increased processing time and even "dancing" while in standing. [name] attention remains poor. Mom is eager for pt to get into an intensive rehab program.   Follow Up Recommendations   (post acute intensive rehab)    Equipment Recommendations  Other (comment) (defer to next venue)    Recommendations for Other Services      Precautions / Restrictions Precautions Precautions: Fall Precaution Comments: peg, incontinence       Mobility Bed Mobility Overal bed mobility: Needs Assistance Bed Mobility: Supine to Sit Rolling: Max assist   Supine to sit: +2 for physical assistance;Mod assist     General bed mobility comments: cues and max assist to roll for changing bed pad and pericare, pt able to bridge to assist with donning brief, requires assist for LEs over EOB and to raise trunk to achieve sitting   Transfers     Transfers: Sit to/from Stand Sit to Stand: +2 physical assistance;Mod assist;Min assist         General transfer comment: +2 mod assist to rise and steady from bed to stedy, +2 min to rise from stedy platforms    Balance Overall balance assessment: Needs  assistance Sitting-balance support: Bilateral upper extremity supported;Feet supported Sitting balance-Leahy Scale: Poor Sitting balance - Comments: max assist, min guard briefly   Standing balance support: Bilateral upper extremity supported Standing balance-Leahy Scale: Poor Standing balance comment: hands placed on stedy cross bar, +2 min assist at pelvis and shoulders                           ADL either performed or assessed with clinical judgement   ADL Overall ADL's : Needs assistance/impaired Eating/Feeding: Total assistance;Sitting Eating/Feeding Details (indicate cue type and reason): hand over hand assist to bring cup to mouth Grooming: Wash/dry face;Total assistance               Lower Body Dressing: Total assistance;Bed level Lower Body Dressing Details (indicate cue type and reason): for brief, bridges to assist     Toileting- Clothing Manipulation and Hygiene: Total assistance;+2 for physical assistance;Bed level Toileting - Clothing Manipulation Details (indicate cue type and reason): pt with urinary incontinence             Vision       Perception     Praxis      Cognition Arousal/Alertness: Awake/alert Behavior During Therapy: Flat affect (smiling at times) Overall Cognitive Status: Impaired/Different from baseline Area of Impairment: Attention;Following commands;Safety/judgement;Awareness;Problem solving                   Current Attention Level: Focused   Following Commands: Follows one step commands inconsistently;Follows one step commands with increased time (and  multimodal cues) Safety/Judgement: Decreased awareness of safety;Decreased awareness of deficits Awareness: Intellectual Problem Solving: Decreased initiation;Difficulty sequencing;Requires verbal cues;Requires tactile cues;Slow processing General Comments: pt nodding her head slightly when responding to yes/no questions and inconsistently, pt responding  favorably to use of music by [facility] during session        Exercises     Shoulder Instructions       General Comments      Pertinent Vitals/ Pain       Pain Assessment: Faces Faces Pain Scale: No hurt  Home Living                                          Prior Functioning/Environment              Frequency  Min 2X/week        Progress Toward Goals  OT Goals(current goals can now be found in the care plan section)  Progress towards OT goals: Progressing toward goals  Acute Rehab OT Goals Patient Stated Goal: unable to state OT Goal Formulation: Patient unable to participate in goal setting Time For Goal Achievement: 11/04/19 Potential to Achieve Goals: Fair  Plan Frequency remains appropriate;Discharge plan remains appropriate    Co-evaluation    PT/OT/SLP Co-Evaluation/Treatment: Yes Reason for Co-Treatment: For patient/therapist safety;Necessary to address cognition/behavior during functional activity   OT goals addressed during session: ADL's and self-care;Strengthening/ROM      AM-PAC OT "6 Clicks" Daily Activity     Outcome Measure   Help from another person eating meals?: Total Help from another person taking care of personal grooming?: Total Help from another person toileting, which includes using toliet, bedpan, or urinal?: Total Help from another person bathing (including washing, rinsing, drying)?: Total Help from another person to put on and taking off regular upper body clothing?: Total Help from another person to put on and taking off regular lower body clothing?: Total 6 Click Score: 6    End of Session    OT Visit Diagnosis: Other symptoms and signs involving cognitive function;Muscle weakness (generalized) (M62.81)   Activity Tolerance     Patient Left in chair;with call bell/phone within reach;with restraints reapplied;with family/visitor present   Nurse Communication          Time: 1610-9604 OT Time  Calculation (min): 39 min  Charges: OT General Charges $OT Visit: 1 Visit OT Treatments $Self Care/Home Management : 8-22 mins $Neuromuscular Re-education: 8-22 mins  Martie Round, OTR/L Acute Rehabilitation Services Pager: [phone] Office: [phone]   Evern Bio 11/03/2019, 12:46 PM

## 2019-11-04 DIAGNOSIS — I469 Cardiac arrest, cause unspecified: Secondary | ICD-10-CM | POA: Diagnosis not present

## 2019-11-04 LAB — GLUCOSE, CAPILLARY
Glucose-Capillary: 88 mg/dL (ref 70–99)
Glucose-Capillary: 134 mg/dL — ABNORMAL HIGH (ref 70–99)
Glucose-Capillary: 123 mg/dL — ABNORMAL HIGH (ref 70–99)
Glucose-Capillary: 139 mg/dL — ABNORMAL HIGH (ref 70–99)
Glucose-Capillary: 105 mg/dL — ABNORMAL HIGH (ref 70–99)

## 2019-11-04 MED ORDER — ALPRAZOLAM 0.25 MG PO TABS
0.2500 mg | ORAL_TABLET | Freq: Every day | ORAL | Status: DC | PRN
  Administered 2019-11-04 – 2019-11-05 (×2): 0.25 mg via ORAL
  Filled 2019-11-04 (×2): qty 1

## 2019-11-04 NOTE — Progress Notes (Signed)
  Speech Language Pathology Treatment: Dysphagia  Patient Details Name: Beverly [name] MRN: [id number] DOB: Mar 31, 1985 Today's Date: 11/04/2019 Time: 0910-0950 SLP Time Calculation (min) (ACUTE ONLY): 40 min  Assessment / Plan / Recommendation Clinical Impression  Pt seen at bedside for skilled ST intervention targeting goals for dysphagia. [name] was present during this session. Oral care was completed with suction. Pt oral cavity noted to be pink, moist, and healthy. Following oral care, pt accepted trials of ice cream/full liquid. Verbal cues required for pt to consistently open her mouth to accept a bolus when being fed. SLP assisted pt with self feeding via hand over hand, and pt opened her mouth more for those boluses. No oral residue of ice cream noted. Pt able to answer a few questions - she told me her name and her mother's name, answers yes/no, and that she prefers vanilla to chocolate. She requires significant encouragement to increase volume and open mouth. [name] asked what she could do between sessions - SLP encouraged mom to ask questions, review orientation information, encourage increased loudness of verbal responses.   Will advance diet to full liquid at this time, and continue to follow for diet tolerance, advancement and education.    HPI HPI: Pt is a 34 y.o. female with PMH of HTN who was admitted 09/23/19 2 weeks post partum after emergent C section due to rupture of membranes with VF arrest. Pt with enterobacter PNA and encephalopathy, trach placed 8/30 and PEG on 8/31. MRI 8/31: No evidence of anoxic brain injury. Pt changed to #4 cuffless trach on 9/15; capped on 9/21 and decannulated on 9/25.      SLP Plan  Continue with current plan of care       Recommendations  Diet recommendations: Thin liquid;Other(comment) (full liquids) Liquids provided via: Teaspoon;Cup;Straw Medication Administration: Via alternative means Supervision: Staff to assist with self  feeding;Full supervision/cueing for compensatory strategies Compensations: Slow rate;Small sips/bites;Minimize environmental distractions;Lingual sweep for clearance of pocketing Postural Changes and/or Swallow Maneuvers: Seated upright 90 degrees                Oral Care Recommendations: Oral care BID Follow up Recommendations: Inpatient Rehab SLP Visit Diagnosis: Dysphagia, oral phase (R13.11) Plan: Continue with current plan of care       GO               Jarquavious Fentress B. Murvin Natal, [place], CCC-SLP Speech Language Pathologist Office: [phone]  [name] 11/04/2019, 9:56 AM

## 2019-11-04 NOTE — Progress Notes (Signed)
Physical Therapy Treatment Patient Details Name: Beverly [name] MRN: [id number] DOB: 1985-06-11 Today's Date: 11/04/2019    History of Present Illness Pt is a 34 y.o. female admitted 09/23/19 with intermittent chest discomfort; of note, pt 2-weeks post-partum (had emergent C-section); while in ED, pt with VF cardiac arrest requiring intubation. Brain MRI 8/23 with no acute abnormality. S/p trach and PEG tube 8/30. Course complicated by encephalopathy, anoxic brain injury, enterobacterial PNA. Transferred out of ICU On 9/4. PMH includes HTN.    PT Comments    Pt was seen for transition to standing with HHA from 2 persons, initially for static standing bedside and then to sidestep up the bed.  Had difficulty getting a pulse reading from monitor and portable pulse ox today.  Pt sidestepped with HHA from 2 for bed to chair, with pt being quite tired after completion of standing work today.  Took a length of time to get her positioned in chair to manage her squirming and moving, and pt was finally more comfortable with sitting with promise from nursing to wash her hair after PT.  Follow acutely to see if a platform or Carley Hammed walker might be helpful soon.     Follow Up Recommendations  CIR     Equipment Recommendations  Other (comment) (TBD after rehab)    Recommendations for Other Services OT consult     Precautions / Restrictions Precautions Precautions: Fall Precaution Comments: peg, incontinence Restrictions Weight Bearing Restrictions: No    Mobility  Bed Mobility Overal bed mobility: Needs Assistance Bed Mobility: Supine to Sit     Supine to sit: Min assist;Mod assist;+2 for physical assistance;+2 for safety/equipment     General bed mobility comments: mod due to non purposeful agitated moving at times, then is more controlled at times  Transfers Overall transfer level: Needs assistance Equipment used: 2 person hand held assist Transfers: Sit to/from Stand Sit to Stand: Mod  assist;+2 physical assistance;+2 safety/equipment;From elevated surface         General transfer comment: 2 mod to stand bedside and 2 mod to sidestep on side of bed to top of bed, then to chair  Ambulation/Gait             General Gait Details: steps are related to her transfers   Stairs             Wheelchair Mobility    Modified Rankin (Stroke Patients Only)       Balance Overall balance assessment: Needs assistance Sitting-balance support: Bilateral upper extremity supported;Feet supported Sitting balance-Leahy Scale: Poor Sitting balance - Comments: mod max due to pt shifting unpredictably but at times is min guard Postural control: Posterior lean;Other (comment) Standing balance support: Bilateral upper extremity supported Standing balance-Leahy Scale: Poor                              Cognition Arousal/Alertness: Awake/alert Behavior During Therapy: Flat affect Overall Cognitive Status: Impaired/Different from baseline Area of Impairment: Attention;Following commands               Rancho Levels of Cognitive Functioning Rancho Los Amigos Scales of Cognitive Functioning: Confused/agitated   Current Attention Level: Focused   Following Commands: Follows one step commands inconsistently;Follows one step commands with increased time Safety/Judgement: Decreased awareness of safety;Decreased awareness of deficits Awareness: Intellectual Problem Solving: Slow processing;Decreased initiation;Difficulty sequencing;Requires verbal cues;Requires tactile cues General Comments: pt is more verbal today, more appropriate with single words  Exercises      General Comments General comments (skin integrity, edema, etc.): pt is unpredictable in movement which acounts for much of her assistance needed.  Shifting steps to get to chair from 2 HHA      Pertinent Vitals/Pain Pain Assessment: Faces Faces Pain Scale: No hurt    Home Living                       Prior Function            PT Goals (current goals can now be found in the care plan section) Acute Rehab PT Goals Patient Stated Goal: get up Progress towards PT goals: Progressing toward goals    Frequency    Min 3X/week      PT Plan Current plan remains appropriate    Co-evaluation              AM-PAC PT "6 Clicks" Mobility   Outcome Measure  Help needed turning from your back to your side while in a flat bed without using bedrails?: A Little Help needed moving from lying on your back to sitting on the side of a flat bed without using bedrails?: A Lot Help needed moving to and from a bed to a chair (including a wheelchair)?: A Lot Help needed standing up from a chair using your arms (e.g., wheelchair or bedside chair)?: A Lot Help needed to walk in hospital room?: A Lot Help needed climbing 3-5 steps with a railing? : Total 6 Click Score: 12    End of Session Equipment Utilized During Treatment: Gait belt Activity Tolerance: Patient tolerated treatment well Patient left: with call bell/phone within reach;with family/visitor present;in chair;with chair alarm set Nurse Communication: Mobility status PT Visit Diagnosis: Other abnormalities of gait and mobility (R26.89);Difficulty in walking, not elsewhere classified (R26.2);Other symptoms and signs involving the nervous system (R29.898)     Time: 1429-1510 PT Time Calculation (min) (ACUTE ONLY): 41 min  Charges:  $Therapeutic Activity: 23-37 mins $Neuromuscular Re-education: 8-22 mins                    Ivar Drape 11/04/2019, 3:52 PM  Samul Dada, PT MS Acute Rehab Dept. Number: [facility] [id number] and [facility] [phone]

## 2019-11-04 NOTE — TOC Progression Note (Signed)
Transition of Care (TOC) - Progression Note  Donn Pierini RN, BSN Transitions of Care Unit 4E- RN Case Manager See Treatment Team for direct phone #    Patient Details  Name: Beverly [name] MRN: [id number] Date of Birth: 23-Dec-1985  Transition of Care [facility]) CM/SW Contact  Zenda Alpers, Lenn Sink, RN Phone Number: 11/04/2019, 4:02 PM  Clinical Narrative:    Sherron Monday with Gunnar Fusi at [facility] INPT rehab [facility]) they are still reviewing clinicals for possible admission- their rehab MD has requested additional clinicals that have been sent as per request- await decision.    Expected Discharge Plan: IP Rehab Facility Barriers to Discharge: Continued Medical Work up, SNF Pending Medicaid, SNF Pending bed offer  Expected Discharge Plan and Services Expected Discharge Plan: IP Rehab Facility In-house Referral: Clinical Social Work   Post Acute Care Choice: IP Rehab                                         Social Determinants of Health (SDOH) Interventions    Readmission Risk Interventions No flowsheet data found.

## 2019-11-04 NOTE — Progress Notes (Signed)
PROGRESS NOTE    Beverly [name]  POE:[id number] DOB: [birth year] DOA: 09/24/2019 PCP: Patient, No Pcp Per    Brief Narrative: 34 year old female presents to the hospital 2 weeks postpartum with chest pain and in the ED sustained V. fib arrest with V. fib x3 epi x1, EKG showed ST elevation underwent Cath Lab at [facility] showed aneurysmal dilation(dissection?) of the LAD was identified. She was transferred to [facility] for ICU admission.LVEF 30% EF in cath lab.  Echo 8/18>LVEF 30-35%, septal and apical akinesis distal anterior wall an d mid/distal inferior wall hypokinesis hyperdynamic basal function. LV size mildly dilated.  Patient suffered anoxic brain injury. 8.23:MRI brain  8/30 underwent tracheostomy 8/30 PEG tube placement 9/10: Continues to be encephalopathic, able to open eyes and track but not following any commands and not talking,is moving extremities independently 9/15: Tracheostomy downsized to # 4 cufless. 9/20:Trach CAP trial, PCCM following SNF planned for 9/30- 30 days out from trach 9/21:Patient is showing improvement with the speech therapy and PT-recommendation changed from SNF to inpatient rehab.  9/25: Trach removed.  Subjective: SLP at bedside, Mother walked in Pt waking up- able to give me soft fist/hi5. Afebrile, blood pressure stable. Saturating well on room air. PICC line has been out no IV access 2/2 difficult access On pureed. trying icecream and some  Food this am  Assessment & Plan:  V. fib arrest due to anterior MI: s/p cardiac cath- aneurysmal proximal LAD with dissection likely culprit.On aspirin 81, Lipitor 80,Coreg 6.25 bid,Aldactone 25 mg, digoxin 0.125 mg, Entresto 24-26 bid per cardio and they signed off 9/23.) Monitoring in telemetry, monitor labs intermittently  Acute systolic congestive heart failure with EF 35%: 2/2 MI.  Weight seems to be trending up.  Monitor closely respiratory status fluid status. Continue on current  Coreg,Entresto Aldactone  and digoxin as above.  Acute encephalopathy/confusion, possible anoxic brain injury: Due to #1.  Had extensive work-up with EEG, MRI brain.  Patient has been trach and PEG dependent. Successfully tolerated trach capping and plan for decannulation soon per PCCM- defer to PCCM. On bolus feed via PEG.  Slowly improving mental status.  Cont melatonin for sleep, seen by psych due to ongoing agitation and insomnia initiated Trileptal but patient's mother refusing it.She responded well with 0.5 mg Xanax at bedtime for sleep.  Speech has seen the patient recommended full liquid diet Saw the patient,has recommended to advance to full liquid diet.  Hypokalemia/hypomagnesemia: Resolved.  Hypertension: BPs fairly stable in 90s to 100s. Dose of Entresto was lowered for soft BP. Tolerating Coreg Aldactone and Entresto. Monitor blood pressure.  History of SVT on NSR,continue Coreg.  Diarrhea off Flexi-Seal. Resolved.  ?Aspiration pneumonia completed antibiotics previously.  DVT prophylaxis: enoxaparin (LOVENOX) injection 40 mg Start: 10/07/19 1000 Place and maintain sequential compression device Start: 09/24/19 0136 Code Status:   Code Status: Full Code  Family Communication: plan of care discussed with patient/s mother at the bedside multiple times previously.  Updated mother at the bedside 9/27, 9/28.  Status is: Inpatient Remains inpatient appropriate because:Inpatient level of care appropriate due to severity of illness and For ongoing management of tracheostomy as per pulmonary  Dispo: The patient is from: Home              Anticipated d/c is to: CIR consulted 9/2, CIR vs SNF. TOC on board.              Anticipated d/c date is: once BED available.  Patient currently is medically stable for discharge to CIR/SNF.  Nutrition: Diet Order            Diet full liquid Room service appropriate? No; Fluid consistency: Thin  Diet effective now                   Nutrition Problem: Increased nutrient needs Etiology: post-op healing Signs/Symptoms: estimated needs Interventions: Tube feeding Body mass index is 27.93 kg/m.  Consultants:see note  Procedures:see note Microbiology:see note Blood Culture    Component Value Date/Time   SDES URINE, RANDOM 10/08/2019 1316   SPECREQUEST NONE 10/08/2019 1316   CULT  10/08/2019 1316    NO GROWTH Performed at [facility] [address], [place], [place] [address]    REPTSTATUS 10/09/2019 FINAL 10/08/2019 1316    Other culture-see note  Medications: Scheduled Meds: . aspirin  81 mg Per Tube Daily  . atorvastatin  80 mg Per Tube Daily  . carvedilol  6.25 mg Per Tube BID WC  . chlorhexidine gluconate (MEDLINE KIT)  15 mL Mouth Rinse BID  . Chlorhexidine Gluconate Cloth  6 each Topical Daily  . digoxin  0.125 mg Per Tube Daily  . enoxaparin (LOVENOX) injection  40 mg Subcutaneous Q24H  . feeding supplement (OSMOLITE 1.5 CAL)  237 mL Per Tube 6 X Daily  . feeding supplement (PROSource TF)  45 mL Per Tube TID  . influenza vac split quadrivalent PF  0.5 mL Intramuscular Tomorrow-1000  . insulin aspart  0-15 Units Subcutaneous Q4H  . melatonin  3 mg Oral QHS  . prenatal multivitamin  1 tablet Per Tube Q1200  . sacubitril-valsartan  1 tablet Per Tube BID  . sodium chloride flush  10-40 mL Intracatheter Q12H  . spironolactone  25 mg Per Tube Daily   Continuous Infusions: . sodium chloride 10 mL/hr at 10/16/19 1600    Antimicrobials: Anti-infectives (From admission, onward)   Start     Dose/Rate Route Frequency Ordered Stop   10/09/19 2100  vancomycin (VANCOREADY) IVPB 1500 mg/300 mL  Status:  Discontinued        1,500 mg 150 mL/hr over 120 Minutes Intravenous Every 12 hours 10/09/19 1237 10/10/19 1121   10/09/19 0800  ceFEPIme (MAXIPIME) 2 g in sodium chloride 0.9 % 100 mL IVPB        2 g 200 mL/hr over 30 Minutes Intravenous Every 8 hours 10/09/19 0752 10/15/19 2147   10/09/19 0800   vancomycin (VANCOREADY) IVPB 2000 mg/400 mL        2,000 mg 200 mL/hr over 120 Minutes Intravenous  Once 10/09/19 0752 10/09/19 1147   10/07/19 1540  ceFAZolin (ANCEF) 2-4 GM/100ML-% IVPB       Note to Pharmacy: [name] Manges   : cabinet override      10/07/19 1540 10/08/19 0344   10/07/19 1400  ceFAZolin (ANCEF) IVPB 2g/100 mL premix        2 g 200 mL/hr over 30 Minutes Intravenous To Radiology 10/06/19 1339 10/07/19 1627   09/30/19 0300  vancomycin (VANCOCIN) IVPB 1000 mg/200 mL premix  Status:  Discontinued        1,000 mg 200 mL/hr over 60 Minutes Intravenous Every 8 hours 09/29/19 1809 10/01/19 0853   09/29/19 1815  ceFEPIme (MAXIPIME) 2 g in sodium chloride 0.9 % 100 mL IVPB        2 g 200 mL/hr over 30 Minutes Intravenous Every 8 hours 09/29/19 1803 10/05/19 1801   09/29/19 1815  vancomycin (  VANCOREADY) IVPB 2000 mg/400 mL        2,000 mg 200 mL/hr over 120 Minutes Intravenous  Once 09/29/19 1803 09/29/19 2137   09/24/19 2200  cefTRIAXone (ROCEPHIN) 2 g in sodium chloride 0.9 % 100 mL IVPB        2 g 200 mL/hr over 30 Minutes Intravenous Daily at bedtime 09/24/19 1201 09/28/19 2338   09/24/19 0330  cefTRIAXone (ROCEPHIN) 1 g in sodium chloride 0.9 % 100 mL IVPB  Status:  Discontinued        1 g 200 mL/hr over 30 Minutes Intravenous Daily at bedtime 09/24/19 0245 09/24/19 1201   09/24/19 0300  azithromycin (ZITHROMAX) 500 mg in sodium chloride 0.9 % 250 mL IVPB  Status:  Discontinued        500 mg 250 mL/hr over 60 Minutes Intravenous Daily at bedtime 09/24/19 0245 09/24/19 1103     Objective: Vitals: Today's Vitals   11/04/19 0500 11/04/19 0509 11/04/19 0728 11/04/19 1054  BP:  102/81 108/64 115/74  Pulse:  90 83 92  Resp:  19 20 19   Temp:  98.3 F (36.8 C) 97.9 F (36.6 C) 98.1 F (36.7 C)  TempSrc:  Oral Axillary Oral  SpO2:  99% 97% 99%  Weight: 88.3 kg     Height:      PainSc:        Intake/Output Summary (Last 24 hours) at 11/04/2019 1201 Last data filed at  11/04/2019 0912 Gross per 24 hour  Intake 412 ml  Output --  Net 412 ml   Filed Weights   11/02/19 0500 11/03/19 0432 11/04/19 0500  Weight: 87 kg 87.6 kg 88.3 kg   Weight change: 0.7 kg  Intake/Output from previous day: 09/27 0701 - 09/28 0700 In: 402 [NG/GT:237] Out: -  Intake/Output this shift: Total I/O In: 10 [P.O.:10] Out: -   Examination: General exam: AAO, follows some commands,gave hi5 fist,  NAD, weak appearing. HEENT:Oral mucosa moist, Ear/Nose WNL grossly, dentition normal.trach site-dressing intact, Respiratory system: bilaterally clear,no wheezing or crackles,no use of accessory muscle Cardiovascular system: S1 & S2 +, No JVD,. Gastrointestinal system: Abdomen soft, NT,ND, BS+ Nervous System:Alert, awake,  Moving extremities and grossly nonfocal Extremities: No edema, distal peripheral pulses palpable.  Skin: No rashes,no icterus. MSK: Normal muscle bulk,tone, power  Data Reviewed: I have personally reviewed following labs and imaging studies CBC: Recent Labs  Lab 10/29/19 0345 11/01/19 1050 11/02/19 0202  WBC 8.8 9.4 8.7  HGB 11.1* 10.2* 10.5*  HCT 36.1 33.6* 34.4*  MCV 84.1 83.8 84.3  PLT 604* 533* 749*   Basic Metabolic Panel: Recent Labs  Lab 10/29/19 0345 11/01/19 0914 11/02/19 0202  NA 137 139 139  K 4.0 4.0 4.3  CL 103 102 105  CO2 26 25 23   GLUCOSE 148* 122* 93  BUN 19 11 12   CREATININE 0.56 0.55 0.49  CALCIUM 9.8 9.8 9.6  MG 2.0  --   --    GFR: Estimated Creatinine Clearance: 121.8 mL/min (by C-G formula based on SCr of 0.49 mg/dL). Liver Function Tests: Recent Labs  Lab 10/29/19 0345  AST 47*  ALT 84*  ALKPHOS 129*  BILITOT 0.5  PROT 8.0  ALBUMIN 3.4*   No results for input(s): LIPASE, AMYLASE in the last 168 hours. No results for input(s): AMMONIA in the last 168 hours. Coagulation Profile: No results for input(s): INR, PROTIME in the last 168 hours. Cardiac Enzymes: No results for input(s): CKTOTAL, CKMB,  CKMBINDEX, TROPONINI in the last  168 hours. BNP (last 3 results) No results for input(s): PROBNP in the last 8760 hours. HbA1C: No results for input(s): HGBA1C in the last 72 hours. CBG: Recent Labs  Lab 11/03/19 1951 11/03/19 2342 11/04/19 0509 11/04/19 0730 11/04/19 1056  GLUCAP 114* 113* 88 134* 123*   Lipid Profile: No results for input(s): CHOL, HDL, LDLCALC, TRIG, CHOLHDL, LDLDIRECT in the last 72 hours. Thyroid Function Tests: No results for input(s): TSH, T4TOTAL, FREET4, T3FREE, THYROIDAB in the last 72 hours. Anemia Panel: No results for input(s): VITAMINB12, FOLATE, FERRITIN, TIBC, IRON, RETICCTPCT in the last 72 hours. Sepsis Labs: No results for input(s): PROCALCITON, LATICACIDVEN in the last 168 hours.  No results found for this or any previous visit (from the past 240 hour(s)).   Radiology Studies: No results found.   LOS: 8 days   Antonieta Pert, MD [facility]  11/04/2019, 12:01 PM

## 2019-11-05 DIAGNOSIS — R5381 Other malaise: Secondary | ICD-10-CM | POA: Diagnosis present

## 2019-11-05 DIAGNOSIS — R1312 Dysphagia, oropharyngeal phase: Secondary | ICD-10-CM | POA: Diagnosis not present

## 2019-11-05 DIAGNOSIS — G931 Anoxic brain damage, not elsewhere classified: Secondary | ICD-10-CM | POA: Diagnosis present

## 2019-11-05 DIAGNOSIS — R410 Disorientation, unspecified: Secondary | ICD-10-CM | POA: Diagnosis present

## 2019-11-05 DIAGNOSIS — R1310 Dysphagia, unspecified: Secondary | ICD-10-CM

## 2019-11-05 DIAGNOSIS — I469 Cardiac arrest, cause unspecified: Secondary | ICD-10-CM | POA: Diagnosis not present

## 2019-11-05 LAB — COMPREHENSIVE METABOLIC PANEL
ALT: 54 U/L — ABNORMAL HIGH (ref 0–44)
AST: 32 U/L (ref 15–41)
Albumin: 3.5 g/dL (ref 3.5–5.0)
Alkaline Phosphatase: 118 U/L (ref 38–126)
Anion gap: 12 (ref 5–15)
BUN: 10 mg/dL (ref 6–20)
CO2: 27 mmol/L (ref 22–32)
Calcium: 10.2 mg/dL (ref 8.9–10.3)
Chloride: 101 mmol/L (ref 98–111)
Creatinine, Ser: 0.71 mg/dL (ref 0.44–1.00)
GFR calc Af Amer: >60 mL/min (ref >60)
GFR calc non Af Amer: >60 mL/min (ref >60)
Glucose, Bld: 109 mg/dL — ABNORMAL HIGH (ref 70–99)
Potassium: 4 mmol/L (ref 3.5–5.1)
Sodium: 140 mmol/L (ref 135–145)
Total Bilirubin: 0.3 mg/dL (ref 0.3–1.2)
Total Protein: 7.6 g/dL (ref 6.5–8.1)

## 2019-11-05 LAB — CBC
HCT: 36.3 % (ref 36.0–46.0)
Hemoglobin: 11.3 g/dL — ABNORMAL LOW (ref 12.0–15.0)
MCH: 26.4 pg (ref 26.0–34.0)
MCHC: 31.1 g/dL (ref 30.0–36.0)
MCV: 84.8 fL (ref 80.0–100.0)
Platelets: 523 10*3/uL — ABNORMAL HIGH (ref 150–400)
RBC: 4.28 MIL/uL (ref 3.87–5.11)
RDW: 18.2 % — ABNORMAL HIGH (ref 11.5–15.5)
WBC: 8.1 10*3/uL (ref 4.0–10.5)
nRBC: 0 % (ref 0.0–0.2)

## 2019-11-05 LAB — GLUCOSE, CAPILLARY
Glucose-Capillary: 108 mg/dL — ABNORMAL HIGH (ref 70–99)
Glucose-Capillary: 120 mg/dL — ABNORMAL HIGH (ref 70–99)
Glucose-Capillary: 125 mg/dL — ABNORMAL HIGH (ref 70–99)
Glucose-Capillary: 127 mg/dL — ABNORMAL HIGH (ref 70–99)
Glucose-Capillary: 170 mg/dL — ABNORMAL HIGH (ref 70–99)
Glucose-Capillary: 80 mg/dL (ref 70–99)
Glucose-Capillary: 95 mg/dL (ref 70–99)

## 2019-11-05 LAB — MAGNESIUM: Magnesium: 1.8 mg/dL (ref 1.7–2.4)

## 2019-11-05 MED ORDER — HYDROXYZINE HCL 10 MG PO TABS: 10.0000 mg | ORAL_TABLET | Freq: Three times a day (TID) | ORAL | Status: DC

## 2019-11-05 MED ORDER — HYDROXYZINE HCL 50 MG/ML IM SOLN
50.0000 mg | Freq: Once | INTRAMUSCULAR | Status: AC
  Administered 2019-11-05: 50 mg via INTRAMUSCULAR
  Filled 2019-11-05: qty 1

## 2019-11-05 MED ORDER — MELATONIN 3 MG PO TABS
3.0000 mg | ORAL_TABLET | Freq: Every evening | ORAL | Status: DC | PRN
  Administered 2019-11-06 – 2019-11-25 (×17): 3 mg via ORAL
  Filled 2019-11-05 (×18): qty 1

## 2019-11-05 MED ORDER — HYDROXYZINE HCL 10 MG PO TABS: 10.0000 mg | ORAL_TABLET | Freq: Three times a day (TID) | ORAL | Status: DC | PRN

## 2019-11-05 MED ORDER — HYDROXYZINE HCL 25 MG PO TABS
25.0000 mg | ORAL_TABLET | Freq: Three times a day (TID) | ORAL | Status: DC
  Administered 2019-11-05 – 2019-11-06 (×3): 25 mg
  Filled 2019-11-05 (×5): qty 1

## 2019-11-05 MED ORDER — OXCARBAZEPINE 150 MG PO TABS
75.0000 mg | ORAL_TABLET | Freq: Two times a day (BID) | ORAL | Status: DC
  Administered 2019-11-05 – 2019-11-25 (×40): 75 mg
  Filled 2019-11-05 (×42): qty 0.5

## 2019-11-05 NOTE — Progress Notes (Signed)
{  OT ALL OT Cancellation Note  Patient Details Name: ILYA ESS MRN: [id number] DOB: 02-May-1985   Cancelled Treatment:    Reason Eval/Treat Not Completed: Patient's level of consciousness (Unable to awake patient, mother present.  )  Kayelynn Abdou D Tryniti Laatsch 11/05/2019, 1:27 PM  11/05/2019  Luan Pulling, OTR/L  Acute Rehabilitation Services  Office:  [phone]

## 2019-11-05 NOTE — Progress Notes (Signed)
OT Cancellation Note  Patient Details Name: Beverly [name] MRN: [id number] DOB: 02-Jan-1986   Cancelled Treatment:    Reason Eval/Treat Not Completed: Patient's level of consciousness (unable to wake patient.  Will continue efforts.  )  Suzanna Obey 11/05/2019, 8:54 AM  11/05/2019  Luan Pulling, OTR/L  Acute Rehabilitation Services  Office:  [phone]

## 2019-11-05 NOTE — TOC Benefit Eligibility Note (Signed)
Transition of Care [facility]) Benefit Eligibility Note    Patient Details  Name: Beverly [name] MRN: [id number] Date of Birth: 06/30/1985   Medication/Dose: JARDIANCE 10 MG DAILY  and  FARXIGA  10 MG DAILY  Covered?: Yes     Prescription Coverage [facility]: [facility]  Spoke with Person/Company/Phone Number:: VICKIE  @  [employer] # [phone]  Co-Pay: $ 3.00  FOR EACH PRESCRIPTION     Deductible:  (NO DEDUCTIBLE  and  NO  OUT-OF-POCKET WITH MEDICAID PLAN)  Additional Notes: ALL BRAND  and  GENERIC MEDICATION  HAVE A  CO-PAY  $3.00  Erling Conte, Verlee Monte Phone Number: 11/05/2019, 2:06 PM

## 2019-11-05 NOTE — Progress Notes (Signed)
[facility] PROGRESS NOTE  TROI BECHTOLD BXI:[id number] DOB: 06-Jul-1985 DOA: 09/24/2019 PCP: Patient, No Pcp Per  Status: Inpatient-Remains inpatient appropriate because:Altered mental status, Ongoing diagnostic testing needed not appropriate for outpatient work up, Unsafe d/c plan, IV treatments appropriate due to intensity of illness or inability to take PO and Inpatient level of care appropriate due to severity of illness   Dispo: The patient is from: Home              Anticipated d/c is to: SNF vs CIR              Anticipated d/c date is: > 3 days              Patient currently is not medically stable to d/c.  Continues to have episodes of acute delirium and agitation in the context of anoxic brain injury.  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  PT and OT have seen patient and recommends SNF placement once otherwise medically stable.   Code Status: Full Family Communication: Patient only; will need to touch base with patient's mother on a regular basis DVT prophylaxis: Lovenox Vaccination status: Has not received Covid vaccine  HPI: 34 year old female presents to the hospital 2 weeks postpartum with chest pain and in the ED sustained V. fib arrest with V. fib x3 epi x1, EKG showed ST elevation underwent Cath Lab at [facility] showed aneurysmal dilation(dissection?) of the LAD was identified. She was transferred to [facility] for ICU admission.LVEF30% EF in cath lab.   Significant events Echo 8/18>LVEF 30-35%, septal and apical akinesis distal anterior wall an d mid/distal inferior wall hypokinesis hyperdynamic basal function. LV size mildly dilated.  Patient suffered anoxic brain injury. 8.23:MRI brain  8/30 underwent tracheostomy 8/30 PEG tube placement 9/10: Continues to be encephalopathic, able to open eyes and track but not following any commands and not talking,is moving extremities independently 9/15:  Tracheostomy downsized to # 4 cufless. 9/20:Trach CAP trial, PCCM following SNF planned for 9/30- 30 days out from trach 9/21:Patient is showing improvement with the speech therapy and PT-recommendation changed from SNF to inpatient rehab.  9/25: Trach removed.  Subjective: Upon my initial evaluation of the patient she was very lethargic.  Her eyes and make eye contact but drifted back off to sleep and was unable to follow any commands.  Later in the morning received call from patient's nurse.  Patient extremely agitated requiring multiple personnel to keep her in the bed.  She would not allow staff to give her any medications or food by mouth nor access her PEG tube for feedings.  Orders placed for one-time dose of IM Vistaril.  Subsequently patient has calmed down.  Objective: Vitals:   11/05/19 0742 11/05/19 1135  BP: 107/79 140/82  Pulse: (!) 101 90  Resp: 19   Temp: 98 F (36.7 C) 97.6 F (36.4 C)  SpO2: 100% 98%    Intake/Output Summary (Last 24 hours) at 11/05/2019 1305 Last data filed at 11/05/2019 0600 Gross per 24 hour  Intake 724 ml  Output 0 ml  Net 724 ml   Filed Weights   11/03/19 0432 11/04/19 0500 11/05/19 0550  Weight: 87.6 kg 88.3 kg 90 kg    Exam:  Constitutional: NAD, calm, peers to be comfortable comfortable Neck: normal, supple, no masses, no thyromegaly-dressing in place at site of previous trach Respiratory: clear to auscultation bilaterally, no wheezing, no crackles. Normal respiratory effort. No accessory muscle  use.  Room air Cardiovascular: Regular rate and rhythm, no murmurs / rubs / gallops. No extremity edema. 2+ pedal pulses. No carotid bruits.  Abdomen: no tenderness, no masses palpated. Bowel sounds positive.  PEG tube in place bolus feeds. Musculoskeletal: no clubbing / cyanosis. No joint deformity upper and lower extremities. Good ROM, no contractures. Normal muscle tone.  Skin: no rashes, lesions, ulcers. No induration Neurologic: CN 2-12  grossly intact except for left eye which appears disconjugate on exam.  Nonverbal and upon my evaluation was not following commands although previously has been doing so.  Sensation peers to be grossly intact, no evidence of extremity spasms on exam.  Lethargic and not following commands therefore unable to test strength Psychiatric: Lethargic upon my evaluation, nonverbal.  Later in day became extremely agitated requiring IM Vistaril for sedation.  Assessment/Plan: V. fib arrest due to anterior MI: -s/p cardiac cath- aneurysm proximal LAD with dissection was culprit. -Continue aspirin, Lipitor,Coreg   Acute systolic congestive heart failure with EF 35%:2/2 MI.   Daily weights with strict I's/O- currently +3 L 98%; unclear true dry weight noting at time of admission patient weighed 228 pounds with current weight 198 pounds Continue Coreg,Entresto Aldactone  and digoxin as above. Echocardiogram was completed 8/18 and anticipate will need to be repeated in November 3 month mark  Acute encephalopathy/acute delirium  2/2 anoxic brain injury:   -Had extensive work-up with EEG, MRI brain.   -Initially trach and PEG dependent.  But thus far has been decannulated -Oral intake remains inconsistent therefore PEG remains in place -Continues to have episodes of severe agitation-would like to avoid benzodiazepine and mother is hopeful can avoid psychotrophic agents. -I have given one-time dose of IM Vistaril today for excessive agitation and aggression and have started Vistaril 25 mg scheduled per tube every 8 hours -Psychiatry evaluated the patient on 9/23 and recommended avoiding benzodiazepines as well as not utilizing antipsychotics given her cardiac history as described above.  Recommended starting a mood nebulizer of Trileptal which we will begin on 9/29  Dysphagia -SLP following and recommended to advance to full liquid diet 9/28. -Patient continues to require verbal cues to consistently keep her  mouth open to except a bolus when being fed.  She is also been assisted with self-feeding by hand over hand methodology.  She continues to require significant encouragement to increase bolus volume and to open her mouth.  Physical deconditioning 2/2 anoxic brain injury -PT documents patient continuing to progress with therapy goals.  She continues to require mod assist of 2 to complete bed mobility and sit to stand from the edge of the bed and utilizes the Lane device for safety and to maintain upright seated and standing position -Tolerating longer episodes of therapy but becomes fatigued with heart rates into the 140s-recommendation is for aggressive inpatient rehabilitation  Nutrition Status: Nutrition Problem: Increased nutrient needs Etiology: post-op healing Signs/Symptoms: estimated needs Interventions: Tube feeding Estimated body mass index is 28.47 kg/m as calculated from the following:   Height as of this encounter: 5' 10"  (1.778 m).   Weight as of this encounter: 90 kg.   Hypokalemia/hypomagnesemia:  -Resolved.  Hypertension:  -BPs fairly stable in 90s to 100s.  -Entresto dose discontinued for suboptimal blood pressure readings   -Continue Coreg Aldactone and Entresto.  History of SVT  -Maintaining sinus rhythm with beta-blocker described above  Diarrhea  -Resolved.  ?Aspiration pneumonia  -Has completed completed antibiotics         Data Reviewed:  Basic Metabolic Panel: Recent Labs  Lab 11/01/19 0914 11/02/19 0202 11/05/19 0428  NA 139 139 140  K 4.0 4.3 4.0  CL 102 105 101  CO2 25 23 27   GLUCOSE 122* 93 109*  BUN 11 12 10   CREATININE 0.55 0.49 0.71  CALCIUM 9.8 9.6 10.2  MG  --   --  1.8   Liver Function Tests: Recent Labs  Lab 11/05/19 0428  AST 32  ALT 54*  ALKPHOS 118  BILITOT 0.3  PROT 7.6  ALBUMIN 3.5   No results for input(s): LIPASE, AMYLASE in the last 168 hours. No results for input(s): AMMONIA in the last 168  hours. CBC: Recent Labs  Lab 11/01/19 1050 11/02/19 0202 11/05/19 0428  WBC 9.4 8.7 8.1  HGB 10.2* 10.5* 11.3*  HCT 33.6* 34.4* 36.3  MCV 83.8 84.3 84.8  PLT 533* 539* 523*   Cardiac Enzymes: No results for input(s): CKTOTAL, CKMB, CKMBINDEX, TROPONINI in the last 168 hours. BNP (last 3 results) Recent Labs    10/13/19 0440 10/14/19 0500 10/15/19 0552  BNP 186.1* 200.4* 168.9*    ProBNP (last 3 results) No results for input(s): PROBNP in the last 8760 hours.  CBG: Recent Labs  Lab 11/04/19 2010 11/05/19 0036 11/05/19 0538 11/05/19 0745 11/05/19 1132  GLUCAP 105* 108* 95 120* 170*    No results found for this or any previous visit (from the past 240 hour(s)).   Studies: No results found.  Scheduled Meds:  aspirin  81 mg Per Tube Daily   atorvastatin  80 mg Per Tube Daily   carvedilol  6.25 mg Per Tube BID WC   chlorhexidine gluconate (MEDLINE KIT)  15 mL Mouth Rinse BID   Chlorhexidine Gluconate Cloth  6 each Topical Daily   digoxin  0.125 mg Per Tube Daily   enoxaparin (LOVENOX) injection  40 mg Subcutaneous Q24H   feeding supplement (OSMOLITE 1.5 CAL)  237 mL Per Tube 6 X Daily   feeding supplement (PROSource TF)  45 mL Per Tube TID   hydrOXYzine  25 mg Per Tube TID   influenza vac split quadrivalent PF  0.5 mL Intramuscular Tomorrow-1000   insulin aspart  0-15 Units Subcutaneous Q4H   prenatal multivitamin  1 tablet Per Tube Q1200   sacubitril-valsartan  1 tablet Per Tube BID   sodium chloride flush  10-40 mL Intracatheter Q12H   spironolactone  25 mg Per Tube Daily   Continuous Infusions:  sodium chloride 10 mL/hr at 10/16/19 1600    Active Problems:   Cardiac arrest [facility])   Acute combined systolic and diastolic heart failure (HCC)   Ventricular tachycardia, polymorphic ([place])   Encounter for central line placement   Respiratory failure ([place])   Acute metabolic encephalopathy   Anoxic brain injury  [facility])   Consultants:  Cardiology  PCCM  Interventional radiology  Neurology  CIR  Psychiatry  Procedures:  8/17 cardiac catheterization  8/18 EEG  8/18 echocardiogram 1. Septal and apical akinesis Distal anterior wall and mid/distal inferior wall hypokinesis Hyperdynamic basal function . Left ventricular ejection fraction, by estimation, is 30 to 35%. The left ventricle has moderately decreased function. The left ventricle demonstrates regional wall motion abnormalities (see scoring diagram/findings for description). The left ventricular internal cavity size was mildly dilated. Left ventricular diastolic parameters are indeterminate. 2. Right ventricular systolic function is normal. The right ventricular size is normal. 3. The mitral valve is normal in structure. Trivial mitral valve regurgitation. No evidence of mitral stenosis. 4.  The aortic valve was not well visualized. Aortic valve regurgitation is not visualized. No aortic stenosis is present. 5. The inferior vena cava is normal in size with greater  8/19 continuous EEG  8/22 EEG  8/31 EEG  8/31 gastrostomy tube placement per interventional radiology  Antibiotics: Anti-infectives (From admission, onward)   Start     Dose/Rate Route Frequency Ordered Stop   10/09/19 2100  vancomycin (VANCOREADY) IVPB 1500 mg/300 mL  Status:  Discontinued        1,500 mg 150 mL/hr over 120 Minutes Intravenous Every 12 hours 10/09/19 1237 10/10/19 1121   10/09/19 0800  ceFEPIme (MAXIPIME) 2 g in sodium chloride 0.9 % 100 mL IVPB        2 g 200 mL/hr over 30 Minutes Intravenous Every 8 hours 10/09/19 0752 10/15/19 2147   10/09/19 0800  vancomycin (VANCOREADY) IVPB 2000 mg/400 mL        2,000 mg 200 mL/hr over 120 Minutes Intravenous  Once 10/09/19 0752 10/09/19 1147   10/07/19 1540  ceFAZolin (ANCEF) 2-4 GM/100ML-% IVPB       Note to Pharmacy: [name] Manges   : cabinet override      10/07/19 1540 10/08/19 0344   10/07/19 1400   ceFAZolin (ANCEF) IVPB 2g/100 mL premix        2 g 200 mL/hr over 30 Minutes Intravenous To Radiology 10/06/19 1339 10/07/19 1627   09/30/19 0300  vancomycin (VANCOCIN) IVPB 1000 mg/200 mL premix  Status:  Discontinued        1,000 mg 200 mL/hr over 60 Minutes Intravenous Every 8 hours 09/29/19 1809 10/01/19 0853   09/29/19 1815  ceFEPIme (MAXIPIME) 2 g in sodium chloride 0.9 % 100 mL IVPB        2 g 200 mL/hr over 30 Minutes Intravenous Every 8 hours 09/29/19 1803 10/05/19 1801   09/29/19 1815  vancomycin (VANCOREADY) IVPB 2000 mg/400 mL        2,000 mg 200 mL/hr over 120 Minutes Intravenous  Once 09/29/19 1803 09/29/19 2137   09/24/19 2200  cefTRIAXone (ROCEPHIN) 2 g in sodium chloride 0.9 % 100 mL IVPB        2 g 200 mL/hr over 30 Minutes Intravenous Daily at bedtime 09/24/19 1201 09/28/19 2338   09/24/19 0330  cefTRIAXone (ROCEPHIN) 1 g in sodium chloride 0.9 % 100 mL IVPB  Status:  Discontinued        1 g 200 mL/hr over 30 Minutes Intravenous Daily at bedtime 09/24/19 0245 09/24/19 1201   09/24/19 0300  azithromycin (ZITHROMAX) 500 mg in sodium chloride 0.9 % 250 mL IVPB  Status:  Discontinued        500 mg 250 mL/hr over 60 Minutes Intravenous Daily at bedtime 09/24/19 0245 09/24/19 1103        Time spent: [place]  [facility] Pager [phone]. If 7PM-7AM, please contact night-coverage at [email or web address] 11/05/2019, 1:05 PM  LOS: 42 days

## 2019-11-05 NOTE — TOC Progression Note (Signed)
Transition of Care (TOC) - Progression Note  Donn Pierini RN, BSN Transitions of Care Unit 4E- RN Case Manager See Treatment Team for direct phone #    Patient Details  Name: Beverly [name] MRN: [id number] Date of Birth: 1986-01-10  Transition of Care [facility]) CM/SW Contact  Zenda Alpers, Lenn Sink, RN Phone Number: 11/05/2019, 9:59 AM  Clinical Narrative:    Received call from Gunnar Fusi at the [facility] [place] this am- unfortunately after review of clinics by the medical team they will not be able to offer a bed for patient at this time or in the near future. Have reached back out to [facility] at [facility] INPT rehab and they can re-review updated clinicals for possible admission- will re-fax current clinicals this am for review and await word from [facility] INPT rehab regarding possible admission and bed availability.  Have called and updated mom regarding above.   Expected Discharge Plan: IP Rehab Facility Barriers to Discharge: Continued Medical Work up, SNF Pending Medicaid, SNF Pending bed offer  Expected Discharge Plan and Services Expected Discharge Plan: IP Rehab Facility In-house Referral: Clinical Social Work   Post Acute Care Choice: IP Rehab                                         Social Determinants of Health (SDOH) Interventions    Readmission Risk Interventions No flowsheet data found.

## 2019-11-05 NOTE — Progress Notes (Signed)
Nutrition Follow-up  DOCUMENTATION CODES:   Not applicable  INTERVENTION:   Continue bolus feedings: -1 carton/ARC of Osmolite 1.5 six times daily  -45 ml ProSource TID  Provides: 2290 kcals, 123 grams protein, 1086 ml free water.   NUTRITION DIAGNOSIS:   Increased nutrient needs related to post-op healing as evidenced by estimated needs.  Ongoing  GOAL:   Patient will meet greater than or equal to 90% of their needs  Addressed via TF  MONITOR:   Vent status, Skin, TF tolerance, I & O's, Labs, Weight trends  REASON FOR ASSESSMENT:   Ventilator    ASSESSMENT:   Patient with PMH significant for HTN and 2 weeks post partum emergency C-section. Presents this admission with new onset peripartum cardiomyopathy with VF arrest.   8/30- trach, PEG 9/4- on ATC 9/20- trach downsized, capped 9/25- trach removed   Pt having episodes of agitation. Diet advanced yesterday to full liquids. Not taking much by mouth today given mental status. Tolerating bolus tube feedings. Continue tube feeding to meet 100% of needs until diet advance further and PO intake remains consistent.   Admission weight: 103.1 kg  Current weight: 90 kg (wt trending up)  Medications: SS novolog, prenatal MVI, aldactone Labs: CBG 95-170  Diet Order:   Diet Order            Diet full liquid Room service appropriate? No; Fluid consistency: Thin  Diet effective now                 EDUCATION NEEDS:   Not appropriate for education at this time  Skin:  Skin Assessment: Reviewed RN Assessment  Last BM:  9/27  Height:   Ht Readings from Last 1 Encounters:  09/24/19 5\' 10"  (1.778 m)    Weight:   Wt Readings from Last 1 Encounters:  11/05/19 90 kg    BMI:  Body mass index is 28.47 kg/m.  Estimated Nutritional Needs:   Kcal:  2150-2350 kcal  Protein:  115-130 grams  Fluid:  >/= 2 L/day   11/07/19 RD, LDN Clinical Nutrition Pager listed in AMION

## 2019-11-05 NOTE — Progress Notes (Addendum)
Mobility Specialist: Progress Note   11/05/19 1434  Mobility  Activity Dangled on edge of bed  Level of Assistance +2 (takes two people)  Assistive Device None  Mobility Response Tolerated fair  Mobility performed by Mobility specialist  $Mobility charge 1 Mobility   Pre-Mobility: 103 HR, 126/72 BP, 95% SpO2 Post-Mobility: 85 HR  Assisted pt to EOB to perform leg extensions. Pt was mod-max assist to EOB. Pt performed 2 sets of 5 reps on each leg and then stood for 30 seconds. Pt was +2 assist to stand w/ help from family member in room. Pt went into tachycardia after 30 seconds standing w/ her HR spiking to 143. After sitting on EOB pt's HR went back down to 118-121 and HR returned to normal after returning to supine in bed. Pt used bed pan at the end of the session. Informed RN and NT that sheets need to be changed.    [facility] Dyesha Henault Mobility Specialist

## 2019-11-06 DIAGNOSIS — I469 Cardiac arrest, cause unspecified: Secondary | ICD-10-CM | POA: Diagnosis not present

## 2019-11-06 DIAGNOSIS — R1312 Dysphagia, oropharyngeal phase: Secondary | ICD-10-CM | POA: Diagnosis not present

## 2019-11-06 LAB — GLUCOSE, CAPILLARY
Glucose-Capillary: 100 mg/dL — ABNORMAL HIGH (ref 70–99)
Glucose-Capillary: 148 mg/dL — ABNORMAL HIGH (ref 70–99)
Glucose-Capillary: 128 mg/dL — ABNORMAL HIGH (ref 70–99)
Glucose-Capillary: 102 mg/dL — ABNORMAL HIGH (ref 70–99)
Glucose-Capillary: 104 mg/dL — ABNORMAL HIGH (ref 70–99)

## 2019-11-06 MED ORDER — DAPAGLIFLOZIN PROPANEDIOL 5 MG PO TABS
5.0000 mg | ORAL_TABLET | Freq: Every day | ORAL | Status: DC
  Administered 2019-11-06 – 2019-11-26 (×21): 5 mg via ORAL
  Filled 2019-11-06 (×21): qty 1

## 2019-11-06 MED ORDER — HYDROXYZINE HCL 50 MG/ML IM SOLN
50.0000 mg | Freq: Four times a day (QID) | INTRAMUSCULAR | Status: DC | PRN
  Administered 2019-11-06 – 2019-11-18 (×7): 50 mg via INTRAMUSCULAR
  Filled 2019-11-06 (×10): qty 1

## 2019-11-06 NOTE — Progress Notes (Signed)
OT Cancellation Note  Patient Details Name: Beverly [name] MRN: [id number] DOB: 1986/01/18   Cancelled Treatment:    Reason Eval/Treat Not Completed: Other (comment) (Patient in with nursing tech for patient care.  )  Continue efforts as appropriate.    Demonte Dobratz D Shantera Monts 11/06/2019, 2:32 PM  11/06/2019  Luan Pulling, OTR/L  Acute Rehabilitation Services  Office:  [phone]
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
## 2019-11-06 NOTE — Progress Notes (Signed)
Physical Therapy Treatment Patient Details Name: Beverly [name] MRN: [id number] DOB: [birth year] Today's Date: 11/06/2019    History of Present Illness Pt is a 34 y.o. female admitted 09/23/19 with intermittent chest discomfort; of note, pt 2-weeks post-partum (had emergent C-section); while in ED, pt with VF cardiac arrest requiring intubation. Brain MRI 8/23 with no acute abnormality. S/p trach and PEG tube 8/30. Course complicated by encephalopathy, anoxic brain injury, enterobacterial PNA. Transferred out of ICU On 9/4. PMH includes HTN.    PT Comments    Pt is making good progress.  Today she was able to take steps with min-mod A of 2.  Had pt ambulate 9' with her arms around staff's shoulders.  She is communicating more but remains impulsive and needs significant cueing and guarding for safety.  Pt was eager to get up but upon sitting pt requesting to lay down but unable to say why.  She was diaphoretic - attempted to assess orthostatic BP but pt would not hold arm still for BP.  After a few mins pt more cooperative with sitting and diaphoresis improved - she was able to proceed with treatment and ambulation.  Cont to advance as able.     Follow Up Recommendations  CIR     Equipment Recommendations  Other (comment) (defer to post acute)    Recommendations for Other Services       Precautions / Restrictions Precautions Precautions: Fall Precaution Comments: peg, incontinence    Mobility  Bed Mobility Overal bed mobility: Needs Assistance Bed Mobility: Supine to Sit     Supine to sit: Min assist;+2 for safety/equipment Sit to supine: Min assist;+2 for safety/equipment   General bed mobility comments: +2 for safety; Pt able to get legs off bed but required assist for trunk to sit; for return to supine required assist to control trunk and legs; pt is impulsive - and required frequent cues to wait for staff to prep  Transfers Overall transfer level: Needs assistance Equipment  used: 2 person hand held assist Transfers: Sit to/from Stand;Stand Pivot Transfers Sit to Stand: Mod assist;+2 physical assistance;+2 safety/equipment;From elevated surface Stand pivot transfers: Mod assist;+2 physical assistance;+2 safety/equipment       General transfer comment: Sit to stand from bed x 1 and from chair x 2.  Required assist to power up and steady.  Assist to position legs under and lean forward to stand  Ambulation/Gait Ambulation/Gait assistance: Mod assist;+2 physical assistance Gait Distance (Feet): 9 Feet (2', 9',2') Assistive device: 2 person hand held assist Gait Pattern/deviations: Step-to pattern;Decreased stride length;Shuffle Gait velocity: decreased   General Gait Details: For ambulation of 9' had pt put arms over therapist and tech shoulders with therapist providing support at gait belt; family provided chair follow.  For 2' transfers bed to/from chair used HHA of 2.  Cued for posture and increased step length   Stairs             Wheelchair Mobility    Modified Rankin (Stroke Patients Only)       Balance Overall balance assessment: Needs assistance Sitting-balance support: Bilateral upper extremity supported;Feet supported Sitting balance-Leahy Scale: Poor Sitting balance - Comments: Pt shifting weight and unpredictable at time so always required at least min guard Postural control: Other (comment) (all directions) Standing balance support: Bilateral upper extremity supported Standing balance-Leahy Scale: Poor Standing balance comment: Requiring min A of 2 once standing  Cognition Arousal/Alertness: Awake/alert Behavior During Therapy: Impulsive Overall Cognitive Status: Impaired/Different from baseline Area of Impairment: Orientation;Problem solving;Attention;Rancho level;Following commands;Safety/judgement;Awareness               Rancho Levels of Cognitive Functioning [place]  Scales of Cognitive Functioning: Confused/inappropriate/non-agitated Orientation Level: Disoriented to;Place;Time;Situation Current Attention Level: Focused   Following Commands: Follows one step commands inconsistently Safety/Judgement: Decreased awareness of safety;Decreased awareness of deficits Awareness: Intellectual Problem Solving: Slow processing;Decreased initiation;Difficulty sequencing;Requires verbal cues;Requires tactile cues        Exercises      General Comments General comments (skin integrity, edema, etc.): Once sitting pt initially stating "lay down," but unable to state why. Mother encouraged pt and she stayed up. Did note pt was diaphoretic - attempted to get BP but pt moving arm and unable to get. He symptoms seemed to ease after a few mins of sitting but difficult to assess. HR was 120s-130s with activity      Pertinent Vitals/Pain Pain Assessment: No/denies pain    Home Living                      Prior Function            PT Goals (current goals can now be found in the care plan section) Acute Rehab PT Goals Patient Stated Goal: not stated PT Goal Formulation: Patient unable to participate in goal setting Time For Goal Achievement: 11/13/19 Potential to Achieve Goals: Good Progress towards PT goals: Progressing toward goals    Frequency    Min 3X/week      PT Plan Current plan remains appropriate    Co-evaluation              AM-PAC PT "6 Clicks" Mobility   Outcome Measure  Help needed turning from your back to your side while in a flat bed without using bedrails?: A Little Help needed moving from lying on your back to sitting on the side of a flat bed without using bedrails?: A Little Help needed moving to and from a bed to a chair (including a wheelchair)?: A Lot Help needed standing up from a chair using your arms (e.g., wheelchair or bedside chair)?: A Lot Help needed to walk in hospital room?: A Lot Help needed  climbing 3-5 steps with a railing? : Total 6 Click Score: 13    End of Session Equipment Utilized During Treatment: Gait belt Activity Tolerance: Patient tolerated treatment well Patient left: with call bell/phone within reach;with family/visitor present;in bed;with bed alarm set (attempted to position in chair x 2 but pt kept sliding forward) Nurse Communication: Mobility status PT Visit Diagnosis: Other abnormalities of gait and mobility (R26.89);Difficulty in walking, not elsewhere classified (R26.2);Other symptoms and signs involving the nervous system (R29.898)     Time: 6568-1275 PT Time Calculation (min) (ACUTE ONLY): 28 min  Charges:  $Gait Training: 8-22 mins $Therapeutic Activity: 8-22 mins                     Anise Salvo, PT Acute Rehab Services Pager [phone] [facility] [phone]     Rayetta Humphrey 11/06/2019, 1:47 PM

## 2019-11-06 NOTE — Progress Notes (Addendum)
[facility] PROGRESS NOTE  Beverly [name] ERX:[id number] DOB: 03/12/1985 DOA: 09/24/2019 PCP: Patient, No Pcp Per  Status: Inpatient-Remains inpatient appropriate because:Altered mental status, Ongoing diagnostic testing needed not appropriate for outpatient work up, Unsafe d/c plan, IV treatments appropriate due to intensity of illness or inability to take PO and Inpatient level of care appropriate due to severity of illness   Dispo: The patient is from: Home              Anticipated d/c is to: SNF vs CIR              Anticipated d/c date is: > 3 days              Patient currently is not medically stable to d/c.  Continues to have episodes of acute delirium and agitation in the context of anoxic brain injury.  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  PT and OT have seen patient and recommends SNF placement once otherwise medically stable.   Code Status: Full Family Communication: Mother at bedside 9/30 DVT prophylaxis: Lovenox Vaccination status: Has not received Covid vaccine  HPI: 34 year old female presents to the hospital 2 weeks postpartum with chest pain and in the ED sustained V. fib arrest with V. fib x3 epi x1, EKG showed ST elevation underwent Cath Lab at [facility] showed aneurysmal dilation(dissection?) of the LAD was identified. She was transferred to [facility] for ICU admission.LVEF30% EF in cath lab.   Significant events Echo 8/18>LVEF 30-35%, septal and apical akinesis distal anterior wall an d mid/distal inferior wall hypokinesis hyperdynamic basal function. LV size mildly dilated.  Patient suffered anoxic brain injury. 8.23:MRI brain  8/30 underwent tracheostomy 8/30 PEG tube placement 9/10: Continues to be encephalopathic, able to open eyes and track but not following any commands and not talking,is moving extremities independently 9/15: Tracheostomy downsized to # 4 cufless. 9/20:Trach CAP trial,  PCCM following SNF planned for 9/30- 30 days out from trach 9/21:Patient is showing improvement with the speech therapy and PT-recommendation changed from SNF to inpatient rehab.  9/25: Trach removed.  Subjective: Patient awake.  Appears to be drowsy from IM Vistaril given overnight.  Does make eye contact and at times appears to nod appropriately during my discussion of her presenting symptoms and current status.  She did nod that she did not quite understand what she was told.  Later mom at bedside and reports that daughter did not recognize her and did not remember that she was in the hospital.  Lengthy discussion held with mom regarding benefits of utilizing nonsedating medications to treat sequela of anoxic brain injury including Trileptal and possibly adding in an SSRI such as Lexapro or Zoloft.  She agrees with not using benzodiazepines and is aware that Vistaril will help with anxiety but has less sedation.  She is agreeable at this time to begin using Trileptal.  Objective: Vitals:   11/06/19 0745 11/06/19 0935  BP: 108/72 108/79  Pulse: 97 96  Resp:    Temp: 98.5 F (36.9 C)   SpO2: 99%     Intake/Output Summary (Last 24 hours) at 11/06/2019 1139 Last data filed at 11/05/2019 2159 Gross per 24 hour  Intake 624 ml  Output 0 ml  Net 624 ml   Filed Weights   11/04/19 0500 11/05/19 0550 11/06/19 0500  Weight: 88.3 kg 90 kg 90.1 kg    Exam:  Constitutional: NAD, calm, appears to be comfortable  Neck: normal, supple, no masses, no thyromegaly-dressing in place at site of previous trach Respiratory: clear to auscultation bilaterally, no wheezing, no crackles. Normal respiratory effort. Room air Cardiovascular: Regular rate and rhythm, no murmurs / rubs / gallops. No extremity edema. 2+ pedal pulses.   Abdomen: no tenderness, no masses palpated. Bowel sounds positive.  PEG tube in place bolus feeds. Musculoskeletal: no clubbing / cyanosis. No joint deformity upper and lower  extremities. Good ROM, no contractures. Normal muscle tone.  Skin: no rashes, lesions, ulcers. No induration Neurologic: CN 2-12 grossly intact although unable to accurately assess visual acuity given patient's limited ability to participate, mostly nonverbal-Sensation peers to be grossly intact, no evidence of extremity spasms on exam.  Moving all extremities spontaneously x4. Psychiatric: Awakened and appears to be mildly sedated, attempts to engage in conversation by making eye contact and several mouth or whispered responses.  Assessment/Plan: V. fib arrest due to anterior MI: -s/p cardiac cath- aneurysm proximal LAD with dissection was culprit. -Continue aspirin, Lipitor,Coreg   Acute systolic congestive heart failure with EF 35%:2/2 MI.   Daily weights with strict I's/O- currently +3 L 98%; unclear true dry weight noting at time of admission patient weighed 228 pounds with current weight 198 pounds Continue Coreg,Entresto Aldactone  and digoxin as above. Echocardiogram was completed 8/18 and anticipate will need to be repeated in November 3 month mark  Prediabetes diabetes 2 in obese -HgbA1c 5.7 -With underlying systolic heart failure will initiate Farxiga 5 mg daily  Acute encephalopathy/acute delirium  2/2 anoxic brain injury:   -Had extensive work-up with EEG, MRI brain.   -Initially trach and PEG dependent.  But thus far has been decannulated -Oral intake remains inconsistent therefore PEG remains in place -Continues to have episodes of confusion, agitation as well as amnesia.  The discussion with patient's mother on 9/30 he is agreeable to resuming the Trileptal initially recommended by psychiatry.  She to agrees that avoiding medications such as benzodiazepines would be best for patient.  She is agreeable to using scheduled Vistaril and if necessary adding an SSRI -Have written an order to allow family members to sit with patient overnight between the hours of 7 PM and 7 AM to  aid in her confusion and agitated and aggressive behaviors -Have also discussed with her mother to try to gradually bring the patient's children in to visit with her since this may help with her anxiety as well as help improve her amnesia symptoms.  Of note overnight patient became extremely agitated and was requesting to go home.  Dysphagia -SLP following and recommended to advance to full liquid diet 9/28. -Patient continues to require verbal cues to consistently keep her mouth open to except a bolus when being fed.  She is also been assisted with self-feeding by hand over hand methodology.  She continues to require significant encouragement to increase bolus volume and to open her mouth. -Mother encouraged to bring in any favorite foods of the patient that would be consistent with a full liquid diet such as a milkshake, etc.  Physical deconditioning 2/2 anoxic brain injury -PT documents patient continuing to progress with therapy goals.  She continues to require mod assist of 2 to complete bed mobility and sit to stand from the edge of the bed and utilizes the Fowler device for safety and to maintain upright seated and standing position -Tolerating longer episodes of therapy but becomes fatigued with heart rates into the 140s-recommendation is for aggressive inpatient rehabilitation  Nutrition Status:  Nutrition Problem: Increased nutrient needs Etiology: post-op healing Signs/Symptoms: estimated needs Interventions: Tube feeding Estimated body mass index is 28.5 kg/m as calculated from the following:   Height as of this encounter: '5\' 10"'  (1.778 m).   Weight as of this encounter: 90.1 kg.   Hypokalemia/hypomagnesemia:  -Resolved.  Hypertension:  -BPs fairly stable in 90s to 100s.  -Entresto dose discontinued for suboptimal blood pressure readings   -Continue Coreg Aldactone and Entresto.  History of SVT  -Maintaining sinus rhythm with beta-blocker described above  Diarrhea   -Resolved.  ?Aspiration pneumonia  -Has completed completed antibiotics   Addendum by attending physician Patient was personally seen and examined agree with above discussed with patient's mother at the bedside and also with the patient.  Patient is more alert awake follows some commands, was agitated yesterday appears to be anxious today/ She told me in muffled voice she is in the hospital and she came here as she was sick and dizziy- we discuss- when I informed that she had cardiac arrest was very tearful but hopeful for recovery. Continue with supportive measures, mother agrees with tarting Trileptal-per psych, may benefit with anxiolytics Xanax mostly at bedtime.  Will benefit with family member at bedside and mother is looking into having someone come during night.  Data Reviewed: Basic Metabolic Panel: Recent Labs  Lab 11/01/19 0914 11/02/19 0202 11/05/19 0428  NA 139 139 140  K 4.0 4.3 4.0  CL 102 105 101  CO2 '25 23 27  ' GLUCOSE 122* 93 109*  BUN '11 12 10  ' CREATININE 0.55 0.49 0.71  CALCIUM 9.8 9.6 10.2  MG  --   --  1.8   Liver Function Tests: Recent Labs  Lab 11/05/19 0428  AST 32  ALT 54*  ALKPHOS 118  BILITOT 0.3  PROT 7.6  ALBUMIN 3.5   No results for input(s): LIPASE, AMYLASE in the last 168 hours. No results for input(s): AMMONIA in the last 168 hours. CBC: Recent Labs  Lab 11/01/19 1050 11/02/19 0202 11/05/19 0428  WBC 9.4 8.7 8.1  HGB 10.2* 10.5* 11.3*  HCT 33.6* 34.4* 36.3  MCV 83.8 84.3 84.8  PLT 533* 539* 523*   Cardiac Enzymes: No results for input(s): CKTOTAL, CKMB, CKMBINDEX, TROPONINI in the last 168 hours. BNP (last 3 results) Recent Labs    10/13/19 0440 10/14/19 0500 10/15/19 0552  BNP 186.1* 200.4* 168.9*    ProBNP (last 3 results) No results for input(s): PROBNP in the last 8760 hours.  CBG: Recent Labs  Lab 11/05/19 1627 11/05/19 2027 11/05/19 2320 11/06/19 0459 11/06/19 0740  GLUCAP 125* 80 127* 100* 148*     No results found for this or any previous visit (from the past 240 hour(s)).   Studies: No results found.  Scheduled Meds: . aspirin  81 mg Per Tube Daily  . atorvastatin  80 mg Per Tube Daily  . carvedilol  6.25 mg Per Tube BID WC  . chlorhexidine gluconate (MEDLINE KIT)  15 mL Mouth Rinse BID  . Chlorhexidine Gluconate Cloth  6 each Topical Daily  . digoxin  0.125 mg Per Tube Daily  . enoxaparin (LOVENOX) injection  40 mg Subcutaneous Q24H  . feeding supplement (OSMOLITE 1.5 CAL)  237 mL Per Tube 6 X Daily  . feeding supplement (PROSource TF)  45 mL Per Tube TID  . hydrOXYzine  25 mg Per Tube TID  . influenza vac split quadrivalent PF  0.5 mL Intramuscular Tomorrow-1000  . insulin aspart  0-15 Units  Subcutaneous Q4H  . OXcarbazepine  75 mg Per Tube BID  . prenatal multivitamin  1 tablet Per Tube Q1200  . sacubitril-valsartan  1 tablet Per Tube BID  . sodium chloride flush  10-40 mL Intracatheter Q12H  . spironolactone  25 mg Per Tube Daily   Continuous Infusions: . sodium chloride 10 mL/hr at 10/16/19 1600    Active Problems:   Cardiac arrest [facility])   Acute combined systolic and diastolic heart failure (HCC)   Ventricular tachycardia, polymorphic ([place])   Encounter for central line placement   Respiratory failure ([place])   Acute metabolic encephalopathy   Anoxic brain injury ([place])   Oropharyngeal dysphagia   Physical deconditioning   Acute delirium   Consultants:  Cardiology  PCCM  Interventional radiology  Neurology  CIR  Psychiatry  Procedures:  8/17 cardiac catheterization  8/18 EEG  8/18 echocardiogram 1. Septal and apical akinesis Distal anterior wall and mid/distal inferior wall hypokinesis Hyperdynamic basal function . Left ventricular ejection fraction, by estimation, is 30 to 35%. The left ventricle has moderately decreased function. The left ventricle demonstrates regional wall motion abnormalities (see scoring diagram/findings for  description). The left ventricular internal cavity size was mildly dilated. Left ventricular diastolic parameters are indeterminate. 2. Right ventricular systolic function is normal. The right ventricular size is normal. 3. The mitral valve is normal in structure. Trivial mitral valve regurgitation. No evidence of mitral stenosis. 4. The aortic valve was not well visualized. Aortic valve regurgitation is not visualized. No aortic stenosis is present. 5. The inferior vena cava is normal in size with greater  8/19 continuous EEG  8/22 EEG  8/31 EEG  8/31 gastrostomy tube placement per interventional radiology  Antibiotics: Anti-infectives (From admission, onward)   Start     Dose/Rate Route Frequency Ordered Stop   10/09/19 2100  vancomycin (VANCOREADY) IVPB 1500 mg/300 mL  Status:  Discontinued        1,500 mg 150 mL/hr over 120 Minutes Intravenous Every 12 hours 10/09/19 1237 10/10/19 1121   10/09/19 0800  ceFEPIme (MAXIPIME) 2 g in sodium chloride 0.9 % 100 mL IVPB        2 g 200 mL/hr over 30 Minutes Intravenous Every 8 hours 10/09/19 0752 10/15/19 2147   10/09/19 0800  vancomycin (VANCOREADY) IVPB 2000 mg/400 mL        2,000 mg 200 mL/hr over 120 Minutes Intravenous  Once 10/09/19 0752 10/09/19 1147   10/07/19 1540  ceFAZolin (ANCEF) 2-4 GM/100ML-% IVPB       Note to Pharmacy: [name] Manges   : cabinet override      10/07/19 1540 10/08/19 0344   10/07/19 1400  ceFAZolin (ANCEF) IVPB 2g/100 mL premix        2 g 200 mL/hr over 30 Minutes Intravenous To Radiology 10/06/19 1339 10/07/19 1627   09/30/19 0300  vancomycin (VANCOCIN) IVPB 1000 mg/200 mL premix  Status:  Discontinued        1,000 mg 200 mL/hr over 60 Minutes Intravenous Every 8 hours 09/29/19 1809 10/01/19 0853   09/29/19 1815  ceFEPIme (MAXIPIME) 2 g in sodium chloride 0.9 % 100 mL IVPB        2 g 200 mL/hr over 30 Minutes Intravenous Every 8 hours 09/29/19 1803 10/05/19 1801   09/29/19 1815  vancomycin (VANCOREADY)  IVPB 2000 mg/400 mL        2,000 mg 200 mL/hr over 120 Minutes Intravenous  Once 09/29/19 1803 09/29/19 2137   09/24/19 2200  cefTRIAXone (  ROCEPHIN) 2 g in sodium chloride 0.9 % 100 mL IVPB        2 g 200 mL/hr over 30 Minutes Intravenous Daily at bedtime 09/24/19 1201 09/28/19 2338   09/24/19 0330  cefTRIAXone (ROCEPHIN) 1 g in sodium chloride 0.9 % 100 mL IVPB  Status:  Discontinued        1 g 200 mL/hr over 30 Minutes Intravenous Daily at bedtime 09/24/19 0245 09/24/19 1201   09/24/19 0300  azithromycin (ZITHROMAX) 500 mg in sodium chloride 0.9 % 250 mL IVPB  Status:  Discontinued        500 mg 250 mL/hr over 60 Minutes Intravenous Daily at bedtime 09/24/19 0245 09/24/19 1103       Time spent: [place] ANP  [facility] Pager [phone]. If 7PM-7AM, please contact night-coverage at [email or web address] 11/06/2019, 11:39 AM  LOS: 43 days

## 2019-11-06 NOTE — Progress Notes (Signed)
Mobility Specialist: Progress Note   11/06/19 1714  Mobility  Activity Dangled on edge of bed (Sit to stand x1)  Level of Assistance +2 (takes two people)  Assistive Device None  Mobility Response Tolerated fair  Mobility performed by Mobility specialist  Bed Position Semi-fowlers  $Mobility charge 1 Mobility   Pre-Mobility: 98 HR During Mobility: 132 HR Post-Mobility: 101 HR  Pt was mod-max assist to EOB from supine. Pt sat on EOB for a few minutes and then performed sit to stand 1x. Sit to stand was +2 assist w/ help from her mom. Pt stood for roughly 30-45 seconds before returning to bed.   [facility] Bud Kaeser Mobility Specialist

## 2019-11-06 NOTE — Progress Notes (Signed)
SLP Cancellation Note  Patient Details Name: Beverly [name] MRN: [id number] DOB: 11-28-1985   Cancelled treatment:       Reason Eval/Treat Not Completed: Other (comment) Per EMR, pt has been agitated and physically aggressive toward staff. Pt was given Hydroxyzine HCL and Dois Davenport, RN indicated that the pt has been lethargic and sleeping for most of this shift. It was agreed that SLP treatment be deferred today.   Taquanna Borras I. Vear Clock, MS, CCC-SLP Acute Rehabilitation Services Office number [phone] Pager [phone]  [name] 11/06/2019, 1:20 PM

## 2019-11-07 DIAGNOSIS — I469 Cardiac arrest, cause unspecified: Secondary | ICD-10-CM | POA: Diagnosis not present

## 2019-11-07 DIAGNOSIS — R1310 Dysphagia, unspecified: Secondary | ICD-10-CM | POA: Diagnosis not present

## 2019-11-07 DIAGNOSIS — R5381 Other malaise: Secondary | ICD-10-CM | POA: Diagnosis not present

## 2019-11-07 LAB — GLUCOSE, CAPILLARY
Glucose-Capillary: 100 mg/dL — ABNORMAL HIGH (ref 70–99)
Glucose-Capillary: 92 mg/dL (ref 70–99)
Glucose-Capillary: 150 mg/dL — ABNORMAL HIGH (ref 70–99)
Glucose-Capillary: 116 mg/dL — ABNORMAL HIGH (ref 70–99)
Glucose-Capillary: 120 mg/dL — ABNORMAL HIGH (ref 70–99)
Glucose-Capillary: 89 mg/dL (ref 70–99)

## 2019-11-07 MED ORDER — HYDROXYZINE HCL 10 MG PO TABS
10.0000 mg | ORAL_TABLET | Freq: Two times a day (BID) | ORAL | Status: DC
  Administered 2019-11-07 – 2019-11-18 (×23): 10 mg
  Filled 2019-11-07 (×25): qty 1

## 2019-11-07 NOTE — Progress Notes (Addendum)
Occupational Therapy Treatment Patient Details Name: Beverly [name] MRN: [id number] DOB: 12-Oct-1985 Today's Date: 11/07/2019    History of present illness Pt is a 34 y.o. female admitted 09/23/19 with intermittent chest discomfort; of note, pt 2-weeks post-partum (had emergent C-section); while in ED, pt with VF cardiac arrest requiring intubation. Brain MRI 8/23 with no acute abnormality. S/p trach and PEG tube 8/30. Course complicated by encephalopathy, anoxic brain injury, enterobacterial PNA. Transferred out of ICU On 9/4. PMH includes HTN.   OT comments  Patient cooperative and able to participate.  Impulsiveness, difficulty controlling movements, decreased UB strength and coordination, cognitive deficits, decreased sit/stand balance all significantly impact her self care and mobility independence compared to her prior level of function.  Able to trial grooming task sitting upright, leaning forward to reach for objects, both arms, and tried to have patient rub lotion to bilateral hands.  Hand over hand required for any fine/gross motor task.  Balance assist at all times.  ST in the room for treatment.  OT will continue to follow in the acute setting.  May benefit from co-treats to assist with maintaining EOB sitting for more functional tasks. Continued intensive rehab post acute is needed.      Follow Up Recommendations  CIR    Equipment Recommendations       Recommendations for Other Services Rehab consult    Precautions / Restrictions Precautions Precautions: Fall Precaution Comments: incontinence Restrictions Weight Bearing Restrictions: No       Mobility Bed Mobility Overal bed mobility: Needs Assistance Bed Mobility: Rolling Rolling: Mod assist   Supine to sit: Mod assist Sit to supine: Mod assist   General bed mobility comments: Remains impulsive, difficulty controlling her movements.  Leans back and foward unexpectedly.  Fair trunk control and poor UB coordination and  decreased strength.  Transfers                      Balance   Sitting-balance support: Bilateral upper extremity supported;Feet supported Sitting balance-Leahy Scale: Poor     Standing balance support: Bilateral upper extremity supported Standing balance-Leahy Scale: Poor                             ADL either performed or assessed with clinical judgement   ADL Overall ADL's : Needs assistance/impaired     Grooming: Wash/dry face;Total assistance Grooming Details (indicate cue type and reason): Total hand over hand for washing her face                                                        Frequency  Min 2X/week        Progress Toward Goals  OT Goals(current goals can now be found in the care plan section)     Acute Rehab OT Goals Patient Stated Goal: Unable to articulate OT Goal Formulation: Patient unable to participate in goal setting Time For Goal Achievement: 11/20/19 Potential to Achieve Goals: Fair  Plan Frequency remains appropriate;Discharge plan remains appropriate    Co-evaluation                 AM-PAC OT "6 Clicks" Daily Activity     Outcome Measure   Help from another person eating meals?: Total Help from another  person taking care of personal grooming?: Total Help from another person toileting, which includes using toliet, bedpan, or urinal?: Total Help from another person bathing (including washing, rinsing, drying)?: Total Help from another person to put on and taking off regular upper body clothing?: Total Help from another person to put on and taking off regular lower body clothing?: Total 6 Click Score: 6    End of Session Equipment Utilized During Treatment: Gait belt  OT Visit Diagnosis: Other symptoms and signs involving cognitive function;Muscle weakness (generalized) (M62.81)   Activity Tolerance Patient tolerated treatment well   Patient Left in bed;with call bell/phone  within reach;with bed alarm set;with nursing/sitter in room   Nurse Communication          Time: 8546-2703 OT Time Calculation (min): 20 min  Charges: OT General Charges $OT Visit: 1 Visit OT Treatments $Self Care/Home Management : 8-22 mins  11/07/2019  Rich, OTR/L  Acute Rehabilitation Services  Office:  [phone]    Suzanna Obey 11/07/2019, 11:54 AM

## 2019-11-07 NOTE — Progress Notes (Signed)
  Speech Language Pathology Treatment:    Patient Details Name: Beverly [name] MRN: [id number] DOB: May 04, 1985 Today's Date: 11/07/2019 Time: 5784-6962 SLP Time Calculation (min) (ACUTE ONLY): 17 min  Assessment / Plan / Recommendation Clinical Impression  Pt was seen for dysphagia treatment. She was alert and cooperative during the session. [name] was not present and some encouragement was needed for p.o. No s/sx of aspiration were noted with trials of solids or liquids. Mastication and bolus formation were prolonged with dysphagia 2 and regular texture solids. Intermittent cueing was required for mastication of regular texture solids which otherwise sat in her mouth with minimal effort at bolus manipulation. Cueing was intermittently required for swallowing of purees but level of cueing has improved since she was last seen by this SLP. It is recommended that the pt's current diet be maintained, but SLP is hopeful that she be able to progress to at least dysphagia 1 next week. SLP will continue to follow pt.      HPI HPI: Pt is a 34 y.o. female with PMH of HTN who was admitted 09/23/19 2 weeks post partum after emergent C section due to rupture of membranes with VF arrest. Pt with enterobacter PNA and encephalopathy, trach placed 8/30 and PEG on 8/31. MRI 8/31: No evidence of anoxic brain injury. Pt changed to #4 cuffless trach on 9/15; capped on 9/21 and decannulated on 9/25.      SLP Plan  Continue with current plan of care       Recommendations  Diet recommendations: Thin liquid (full liquids) Liquids provided via: Cup;Teaspoon;Straw Medication Administration: Via alternative means Supervision: Staff to assist with self feeding;Full supervision/cueing for compensatory strategies Compensations: Slow rate;Small sips/bites;Minimize environmental distractions;Follow solids with liquid Postural Changes and/or Swallow Maneuvers: Seated upright 90 degrees                Oral Care  Recommendations: Oral care BID;Staff/trained caregiver to provide oral care Follow up Recommendations: Inpatient Rehab SLP Visit Diagnosis: Dysphagia, oral phase (R13.11) Plan: Continue with current plan of care       Collin Hendley I. Vear Clock, MS, CCC-SLP Acute Rehabilitation Services Office number [phone] Pager [phone]                 [name] 11/07/2019, 1:06 PM

## 2019-11-07 NOTE — Progress Notes (Signed)
Mobility Specialist - Progress Note   11/07/19 1405  Mobility  Activity Ambulated in room;Sat and stood x 3  Level of Assistance +2 (takes two people)  Assistive Device None  Distance Ambulated (ft) 24 ft  Mobility Response Tolerated fair  Mobility performed by Mobility specialist;Nurse tech  $Mobility charge 1 Mobility   Pre-mobility: 96 HR During mobility: 125 HR Post-mobility: 105 HR  Pt initially did not want to walk and agreed to do 3 sit-to-stands, after her third time she decided she'd like to ambulate. Pt required +2 assistance for stability and safety. Pt back in bed after walk for tube feeding by RN.   Beverly [name] Mobility Specialist Mobility Specialist Phone: [phone]

## 2019-11-07 NOTE — Progress Notes (Addendum)
[facility] PROGRESS NOTE  Beverly [name] KCL:[id number] DOB: 1985/07/06 DOA: 09/24/2019 PCP: Patient, No Pcp Per  Status: Inpatient-Remains inpatient appropriate because:Altered mental status, Ongoing diagnostic testing needed not appropriate for outpatient work up, Unsafe d/c plan, IV treatments appropriate due to intensity of illness or inability to take PO and Inpatient level of care appropriate due to severity of illness   Dispo: The patient is from: Home              Anticipated d/c is to: SNF vs CIR              Anticipated d/c date is: > 3 days              Patient currently is not medically stable to d/c.  Continues to have episodes of acute delirium and agitation in the context of anoxic brain injury but as of 10/1 these episodes are improving with the addition of appropriate pharmacotherapy.  She requires  pharmacological measures to treat.  PT and OT have seen patient and recommend CIR vs SNF placement once otherwise medically stable.  Case has been reviewed by [facility] rehab and they felt they could not provide the level of care the patient needed.  Other local inpatient rehab options are being explored.  Unfortunately CIR is not "in network" for patient's insurance coverage.  Medicaid application is also pending.   Code Status: Full Family Communication: Mother at bedside 9/30 DVT prophylaxis: Lovenox Vaccination status: Has not received Covid vaccine  HPI: 34 year old female presents to the hospital 2 weeks postpartum with chest pain and in the ED sustained V. fib arrest with V. fib x3 epi x1, EKG showed ST elevation underwent Cath Lab at [facility] showed aneurysmal dilation(dissection?) of the LAD was identified. She was transferred to [facility] for ICU admission. LVEF 30% EF in cath lab.    Significant events Echo 8/18> LVEF 30-35%, septal and apical akinesis distal anterior wall an d mid/distal inferior wall hypokinesis hyperdynamic basal function. LV size mildly  dilated.  Patient suffered anoxic brain injury. 8.23:MRI brain  8/30 underwent tracheostomy 8/30 PEG tube placement 9/10: Continues to be encephalopathic, able to open eyes and track but not following any commands and not talking,is moving extremities independently 9/15: Tracheostomy downsized to # 4 cufless. 9/20:Trach CAP trial, PCCM following SNF planned for 9/30- 30 days out from trach 9/21:Patient is showing improvement with the speech therapy and PT-recommendation changed from SNF to inpatient rehab.  9/25: Trach removed.  Subjective: Patient awake and attempts to verbalize answers to questions and/or otherwise nods appropriately.  Remains confused but is not agitated or aggressive.  Appears mildly sedated and this was an issue yesterday-likely from combination of IM Vistaril as well as higher doses of Vistaril that are scheduled.  Objective: Vitals:   11/07/19 0824 11/07/19 0940  BP: 113/83 114/68  Pulse: (!) 101 86  Resp: 18   Temp: 98.5 F (36.9 C)   SpO2: 99%     Intake/Output Summary (Last 24 hours) at 11/07/2019 1005 Last data filed at 11/07/2019 0622 Gross per 24 hour  Intake 574 ml  Output --  Net 574 ml   Filed Weights   11/05/19 0550 11/06/19 0500 11/07/19 0104  Weight: 90 kg 90.1 kg 91.7 kg    Exam:  Constitutional: NAD, calm, appears to be comfortable and somewhat sedated Neck: normal, supple, no masses, no thyromegaly-dressing in place at site of previous trach Respiratory: clear to auscultation bilaterally, no wheezing, no crackles.  Normal respiratory effort. Room air Cardiovascular: Regular rate and rhythm, no murmurs / rubs / gallops. No extremity edema. 2+ pedal pulses.   Abdomen: no tenderness, no masses palpated. Bowel sounds positive.  PEG tube in place bolus feeds.  Domino binder in place Musculoskeletal: no clubbing / cyanosis. No joint deformity upper and lower extremities. Good ROM, no contractures. Normal muscle tone.  Skin: no rashes,  lesions, ulcers. No induration Neurologic: CN 2-12 grossly intact although unable to accurately assess visual acuity given patient's limited ability to participate (patient wore glasses prior to admission), mostly nonverbal-Sensation peers to be grossly intact, no evidence of extremity spasms on exam.  Moving all extremities spontaneously x4. Psychiatric: Awakened and appears to be mildly sedated, attempts to engage in conversation by making eye contact and several mouthed whispered responses.  Assessment/Plan: V. fib arrest due to anterior MI: -s/p cardiac cath- aneurysm proximal LAD with dissection was culprit. -Continue aspirin, Lipitor,Coreg    Acute systolic congestive heart failure with EF 35%: 2/2 MI.   Daily weights with strict I's/O- currently +3 L 98%; unclear true dry weight noting at time of admission patient weighed 228 pounds with current weight 198 pounds Continue Coreg,Entresto Aldactone  and digoxin as above. Echocardiogram was completed 8/18 and anticipate will need to be repeated in November 3 month mark  Prediabetes diabetes 2 in obese -HgbA1c 5.7 -With underlying systolic heart failure will initiate Farxiga 5 mg daily   Acute encephalopathy/acute delirium  2/2 anoxic brain injury:   -Had extensive work-up with EEG, MRI brain.   -Initially trach and PEG dependent.  But thus far has been decannulated -Oral intake remains inconsistent therefore PEG remains in place -Continues to have episodes of confusion, agitation as well as amnesia.  -Patient's mother agreeable to Trileptal as recommended by psychiatry.  She to agrees that avoiding medications such as benzodiazepines would be best for patient.  She is agreeable to using scheduled Vistaril and if necessary adding an SSRI -As of 10/1 patient appears somewhat sedated therefore have decreased scheduled Vistaril to 10 mg BID -Have written an order to allow family members to sit with patient overnight between the hours of 7 PM  and 7 AM to aid in her confusion and agitated and aggressive behaviors -Have also discussed with her mother to try to gradually bring the patient's children in to visit with her since this may help with her anxiety as well as help improve her amnesia symptoms.  Of note overnight patient became extremely agitated and was requesting to go home. -Have also placed order that to help improve patient's socialization she may be out of bed to chair and sit at nursing station -10/1 have asked CIR to reevaluate the patient now that she is more awake and participating with therapies  Dysphagia -SLP following and recommended to advance to full liquid diet 9/28. -Patient continues to require verbal cues to consistently keep her mouth open to except a bolus when being fed.  She is also been assisted with self-feeding by hand over hand methodology.  She continues to require significant encouragement to increase bolus volume and to open her mouth. -Mother encouraged to bring in any favorite foods of the patient that would be consistent with a full liquid diet such as a milkshake, etc.  Physical deconditioning 2/2 anoxic brain injury -PT documents patient continuing to progress with therapy goals.  She continues to require mod assist of two providers to complete bed mobility and sit to stand from the edge of  the bed and utilizes the [place] device for safety and to maintain upright seated and standing position -Tolerating longer episodes of therapy but becomes fatigued with heart rates into the 140s-recommendation is for aggressive inpatient rehabilitation  Nutrition Status: Nutrition Problem: Increased nutrient needs Etiology: post-op healing Signs/Symptoms: estimated needs Interventions: Tube feeding Estimated body mass index is 29.01 kg/m as calculated from the following:   Height as of this encounter: 5\' 10"  (1.778 m).   Weight as of this encounter: 91.7 kg.   Hypokalemia/hypomagnesemia:  -Resolved.    Hypertension:  -BPs fairly stable in 90s to 100s.  -Entresto dose discontinued for suboptimal blood pressure readings   -Continue Coreg Aldactone and Entresto.   History of SVT  -Maintaining sinus rhythm with beta-blocker described above   Diarrhea  -Resolved.   ?Aspiration pneumonia  -Has completed completed antibiotics     Data Reviewed: Basic Metabolic Panel: Recent Labs  Lab 11/01/19 0914 11/02/19 0202 11/05/19 0428  NA 139 139 140  K 4.0 4.3 4.0  CL 102 105 101  CO2 25 23 27   GLUCOSE 122* 93 109*  BUN 11 12 10   CREATININE 0.55 0.49 0.71  CALCIUM 9.8 9.6 10.2  MG  --   --  1.8   Liver Function Tests: Recent Labs  Lab 11/05/19 0428  AST 32  ALT 54*  ALKPHOS 118  BILITOT 0.3  PROT 7.6  ALBUMIN 3.5   No results for input(s): LIPASE, AMYLASE in the last 168 hours. No results for input(s): AMMONIA in the last 168 hours. CBC: Recent Labs  Lab 11/01/19 1050 11/02/19 0202 11/05/19 0428  WBC 9.4 8.7 8.1  HGB 10.2* 10.5* 11.3*  HCT 33.6* 34.4* 36.3  MCV 83.8 84.3 84.8  PLT 533* 539* 523*   Cardiac Enzymes: No results for input(s): CKTOTAL, CKMB, CKMBINDEX, TROPONINI in the last 168 hours. BNP (last 3 results) Recent Labs    10/13/19 0440 10/14/19 0500 10/15/19 0552  BNP 186.1* 200.4* 168.9*    ProBNP (last 3 results) No results for input(s): PROBNP in the last 8760 hours.  CBG: Recent Labs  Lab 11/06/19 1551 11/06/19 2001 11/07/19 0104 11/07/19 0336 11/07/19 0819  GLUCAP 102* 104* 100* 92 150*    No results found for this or any previous visit (from the past 240 hour(s)).   Studies: No results found.  Scheduled Meds:  aspirin  81 mg Per Tube Daily   atorvastatin  80 mg Per Tube Daily   carvedilol  6.25 mg Per Tube BID WC   dapagliflozin propanediol  5 mg Oral Daily   digoxin  0.125 mg Per Tube Daily   enoxaparin (LOVENOX) injection  40 mg Subcutaneous Q24H   feeding supplement (OSMOLITE 1.5 CAL)  237 mL Per Tube 6 X Daily    feeding supplement (PROSource TF)  45 mL Per Tube TID   hydrOXYzine  10 mg Per Tube BID   influenza vac split quadrivalent PF  0.5 mL Intramuscular Tomorrow-1000   insulin aspart  0-15 Units Subcutaneous Q4H   OXcarbazepine  75 mg Per Tube BID   prenatal multivitamin  1 tablet Per Tube Q1200   sacubitril-valsartan  1 tablet Per Tube BID   spironolactone  25 mg Per Tube Daily   Continuous Infusions:  sodium chloride Stopped (11/06/19 1900)    Active Problems:   Cardiac arrest (HCC)   Acute combined systolic and diastolic heart failure (HCC)   Ventricular tachycardia, polymorphic (HCC)   Encounter for central line placement   Respiratory  failure (HCC)   Acute metabolic encephalopathy   Anoxic brain injury [facility])   Oropharyngeal dysphagia   Physical deconditioning   Acute delirium   Consultants: Cardiology PCCM Interventional radiology Neurology CIR Psychiatry  Procedures: 8/17 cardiac catheterization 8/18 EEG 8/18 echocardiogram 1. Septal and apical akinesis Distal anterior wall and mid/distal inferior wall hypokinesis Hyperdynamic basal function . Left ventricular ejection fraction, by estimation, is 30 to 35%. The left ventricle has moderately decreased function. The left ventricle demonstrates regional wall motion abnormalities (see scoring diagram/findings for description). The left ventricular internal cavity size was mildly dilated. Left ventricular diastolic parameters are indeterminate. 2. Right ventricular systolic function is normal. The right ventricular size is normal. 3. The mitral valve is normal in structure. Trivial mitral valve regurgitation. No evidence of mitral stenosis. 4. The aortic valve was not well visualized. Aortic valve regurgitation is not visualized. No aortic stenosis is present. 5. The inferior vena cava is normal in size with greater 8/19 continuous EEG 8/22 EEG 8/31 EEG 8/31 gastrostomy tube placement per interventional  radiology  Antibiotics: Anti-infectives (From admission, onward)    Start     Dose/Rate Route Frequency Ordered Stop   10/09/19 2100  vancomycin (VANCOREADY) IVPB 1500 mg/300 mL  Status:  Discontinued        1,500 mg 150 mL/hr over 120 Minutes Intravenous Every 12 hours 10/09/19 1237 10/10/19 1121   10/09/19 0800  ceFEPIme (MAXIPIME) 2 g in sodium chloride 0.9 % 100 mL IVPB        2 g 200 mL/hr over 30 Minutes Intravenous Every 8 hours 10/09/19 0752 10/15/19 2147   10/09/19 0800  vancomycin (VANCOREADY) IVPB 2000 mg/400 mL        2,000 mg 200 mL/hr over 120 Minutes Intravenous  Once 10/09/19 0752 10/09/19 1147   10/07/19 1540  ceFAZolin (ANCEF) 2-4 GM/100ML-% IVPB       Note to Pharmacy: Erie Noe   : cabinet override      10/07/19 1540 10/08/19 0344   10/07/19 1400  ceFAZolin (ANCEF) IVPB 2g/100 mL premix        2 g 200 mL/hr over 30 Minutes Intravenous To Radiology 10/06/19 1339 10/07/19 1627   09/30/19 0300  vancomycin (VANCOCIN) IVPB 1000 mg/200 mL premix  Status:  Discontinued        1,000 mg 200 mL/hr over 60 Minutes Intravenous Every 8 hours 09/29/19 1809 10/01/19 0853   09/29/19 1815  ceFEPIme (MAXIPIME) 2 g in sodium chloride 0.9 % 100 mL IVPB        2 g 200 mL/hr over 30 Minutes Intravenous Every 8 hours 09/29/19 1803 10/05/19 1801   09/29/19 1815  vancomycin (VANCOREADY) IVPB 2000 mg/400 mL        2,000 mg 200 mL/hr over 120 Minutes Intravenous  Once 09/29/19 1803 09/29/19 2137   09/24/19 2200  cefTRIAXone (ROCEPHIN) 2 g in sodium chloride 0.9 % 100 mL IVPB        2 g 200 mL/hr over 30 Minutes Intravenous Daily at bedtime 09/24/19 1201 09/28/19 2338   09/24/19 0330  cefTRIAXone (ROCEPHIN) 1 g in sodium chloride 0.9 % 100 mL IVPB  Status:  Discontinued        1 g 200 mL/hr over 30 Minutes Intravenous Daily at bedtime 09/24/19 0245 09/24/19 1201   09/24/19 0300  azithromycin (ZITHROMAX) 500 mg in sodium chloride 0.9 % 250 mL IVPB  Status:  Discontinued        500  mg 250 mL/hr over  60 Minutes Intravenous Daily at bedtime 09/24/19 0245 09/24/19 1103        Time spent: 30    Junious Silk ANP  [facility] Pager [phone]. If 7PM-7AM, please contact night-coverage at [email or web address] 11/07/2019, 10:05 AM  LOS: 44 days   Patient was seen and examined. agree with above assessment and plan  She remains alert awake follows some commands but somewhat more sleepy today we will continue to work on minimizing patient's anxiety medication including Atarax. We are attempting to arrange rehab for her , social worker looking into rehab at [facility] and [name] Rainwater has been declined by [facility] in [place].  CIR here is out of network unfortunately.

## 2019-11-07 NOTE — TOC Progression Note (Addendum)
Transition of Care (TOC) - Progression Note  Donn Pierini RN, BSN Transitions of Care Unit 4E- RN Case Manager See Treatment Team for direct phone #    Patient Details  Name: Beverly [name] MRN: [id number] Date of Birth: 1985/09/14  Transition of Care [facility]) CM/SW Contact  Zenda Alpers, Lenn Sink, RN Phone Number: 11/07/2019, 10:12 AM  Clinical Narrative:    Received a call from Pam at [facility] this am - their medical team has reviewed clinicals and unfortunately they do not feel they can provide the level of care pt needs and are giving a denial for admission at this time- they will not be offering a bed.  Will speak to mom on seeing if she wants to check with other INPT rehabs further away such as [place] area.   1030- spoke with pt who give verbal consent to reach out to INPT rehabs in [place] and [facility], calls have been made to both facilities awake return calls.   1215- received return call from Amy at [facility] : [facility]- per conversation however they are not in network with pt's Medicaid advantage plan- still await return call from [facility] rehab.   Expected Discharge Plan: IP Rehab Facility Barriers to Discharge: Continued Medical Work up, SNF Pending Medicaid, SNF Pending bed offer  Expected Discharge Plan and Services Expected Discharge Plan: IP Rehab Facility In-house Referral: Clinical Social Work   Post Acute Care Choice: IP Rehab                                         Social Determinants of Health (SDOH) Interventions    Readmission Risk Interventions No flowsheet data found.

## 2019-11-08 DIAGNOSIS — I5041 Acute combined systolic (congestive) and diastolic (congestive) heart failure: Secondary | ICD-10-CM | POA: Diagnosis not present

## 2019-11-08 DIAGNOSIS — I469 Cardiac arrest, cause unspecified: Secondary | ICD-10-CM | POA: Diagnosis not present

## 2019-11-08 DIAGNOSIS — I472 Ventricular tachycardia: Secondary | ICD-10-CM | POA: Diagnosis not present

## 2019-11-08 DIAGNOSIS — J9601 Acute respiratory failure with hypoxia: Secondary | ICD-10-CM | POA: Diagnosis not present

## 2019-11-08 DIAGNOSIS — R5381 Other malaise: Secondary | ICD-10-CM

## 2019-11-08 LAB — GLUCOSE, CAPILLARY
Glucose-Capillary: 115 mg/dL — ABNORMAL HIGH (ref 70–99)
Glucose-Capillary: 95 mg/dL (ref 70–99)
Glucose-Capillary: 170 mg/dL — ABNORMAL HIGH (ref 70–99)
Glucose-Capillary: 105 mg/dL — ABNORMAL HIGH (ref 70–99)
Glucose-Capillary: 106 mg/dL — ABNORMAL HIGH (ref 70–99)
Glucose-Capillary: 120 mg/dL — ABNORMAL HIGH (ref 70–99)

## 2019-11-08 NOTE — Progress Notes (Signed)
Mobility Specialist - Progress Note   11/08/19 1126  Mobility  Activity Ambulated in hall  Level of Assistance +2 (takes two people)  Assistive Device Wheelchair  Mobility Response Tolerated fair  Mobility performed by Mobility specialist;Nurse tech  $Mobility charge 1 Mobility   Assisted NT with mobilizing pt in the hallway w/ a wheelchair as she has been restless today. Pt required increasingly frequent stops to correct her body position when sitting as she began feeling fatigued. She required +2 max assist to transfer from wheelchair to bed.   Mamie Levers Mobility Specialist Mobility Specialist Phone: [phone]

## 2019-11-08 NOTE — Progress Notes (Signed)
Patient ID: Beverly [name], female   DOB: 1985/08/22, 34 y.o.   MRN: [id number]  PROGRESS NOTE    Beverly [name]  JEH:[id number] DOB: 1985/09/17 DOA: 09/24/2019 PCP: Patient, No Pcp Per    Brief Narrative:  34 year old female presents to the hospital 2 weeks postpartum with chest pain and in the ED sustained V. fib arrest with V. fib x3 epi x1, EKG showed ST elevation underwent Cath Lab at [facility] showed aneurysmal dilation(dissection?) of the LAD was identified. She was transferred to [facility] for ICU admission.LVEF30% EF in cath lab.  Has since woken up and has evidence of anoxic brain injury.  Significant events Echo 8/18>LVEF 30-35%, septal and apical akinesis distal anterior wall an d mid/distal inferior wall hypokinesis hyperdynamic basal function. LV size mildly dilated.  Patient suffered anoxic brain injury. 8.23:MRI brain  8/30 underwent tracheostomy 8/30 PEG tube placement 9/10: Continues to be encephalopathic, able to open eyes and track but not following any commands and not talking,is moving extremities independently 9/15: Tracheostomy downsized to # 4 cufless. 9/20:Trach CAP trial, PCCM following SNF planned for 9/30- 30 days out from trach 9/21:Patient is showing improvement with the speech therapy and PT-recommendation changed from SNF to inpatient rehab.  9/25: Trach removed.  Assessment & Plan:   Active Problems:   Cardiac arrest (HCC)   Acute combined systolic and diastolic heart failure (HCC)   Ventricular tachycardia, polymorphic (HCC)   Encounter for central line placement   Respiratory failure (HCC)   Acute metabolic encephalopathy   Anoxic brain injury (HCC)   Dysphagia   Physical deconditioning   Acute delirium   V. fib arrest due to anterior MI: -s/pcardiac cath-aneurysm proximal LAD with dissection was culprit. -Continue aspirin, Lipitor,Coreg   Acute systolic congestive heart failure with EF 35%:2/2 MI.  Daily weights with strict  I's/O- currently +3 L 98%; unclear true dry weight noting at time of admission patient weighed 228 pounds with current weight 198 pounds Continue Coreg,Entresto Aldactone and digoxin as above. Echocardiogram was completed 8/18 and anticipate will need to be repeated in November 3 month mark  Prediabetes diabetes 2 in obese -HgbA1c 5.7 -With underlying systolic heart failure will initiate Farxiga 5 mg daily  Acute encephalopathy/acute delirium  2/2 anoxic brain injury:  -Had extensive work-up with EEG, MRI brain.  -Initially trach and PEG dependent.  But thus far has been decannulated -Oral intake remains inconsistent therefore PEG remains in place -Continues to have episodes of confusion, agitation as well as amnesia.  -Patient's mother agreeable to Trileptal as recommended by psychiatry.  She to agrees that avoiding medications such as benzodiazepines would be best for patient.  She is agreeable to using scheduled Vistaril and if necessary adding an SSRI -As of 10/1 patient appears somewhat sedated therefore have decreased scheduled Vistaril to 10 mg BID -Have written an order to allow family members to sit with patient overnight between the hours of 7 PM and 7 AM to aid in her confusion and agitated and aggressive behaviors -Have also discussed with her mother to try to gradually bring the patient's children in to visit with her since this may help with her anxiety as well as help improve her amnesia symptoms.  Of note overnight patient became extremely agitated and was requesting to go home. -Have also placed order that to help improve patient's socialization she may be out of bed to chair and sit at nursing station -10/1 have asked CIR to reevaluate the patient now that she  is more awake and participating with therapies CIR declined to admit the patient.  Recommend to see if she qualifies for inpatient [facility].  Awaiting to hear back.  Due to her ongoing confusion trying to get out of bed we  have ordered an enclosure bed for her that she can be a little more active.  Dysphagia -SLP following and recommended to advance to full liquid diet 9/28. -Patient continues to require verbal cues to consistently keep her mouth open to except a bolus when being fed.  She is also been assisted with self-feeding by hand over hand methodology.  She continues to require significant encouragement to increase bolus volume and to open her mouth. -Mother encouraged to bring in any favorite foods of the patient that would be consistent with a full liquid diet such as a milkshake, etc.  Physical deconditioning 2/2 anoxic brain injury -PT documents patient continuing to progress with therapy goals.  She continues to require mod assist of two providers to complete bed mobility and sit to stand from the edge of the bed and utilizes the [place] device for safety and to maintain upright seated and standing position -Tolerating longer episodes of therapy but becomes fatigued with heart rates into the 140s-we will need aggressive inpatient rehabilitation  Nutrition Status: Nutrition Problem: Increased nutrient needs Etiology: post-op healing Signs/Symptoms: estimated needs Interventions: Tube feeding Estimated body mass index is 29.01 kg/m as calculated from the following:   Height as of this encounter: 5\' 10"  (1.778 m).   Weight as of this encounter: 91.7 kg.   Hypokalemia/hypomagnesemia: -Resolved.  Hypertension: -BPs fairly stable in 90s to 100s.  -Entresto dose discontinued for suboptimal blood pressure readings   -Continue Coreg Aldactone and Entresto.  History of SVT  -Maintaining sinus rhythm with beta-blocker described above  Diarrhea  -Resolved.  ?Aspiration pneumonia -Has completed antibiotics    DVT prophylaxis: Lovenox SQ Code Status: Full code  Family Communication: None at bedside Disposition Plan: SNF versus inpatient rehab   Consultants:   Cardiology  Inpatient  rehab  Procedures:  Left heart cath  2D echo  EKG  Intubation  Trach  PEG tube placement  Antimicrobials: Anti-infectives (From admission, onward)   Start     Dose/Rate Route Frequency Ordered Stop   10/09/19 2100  vancomycin (VANCOREADY) IVPB 1500 mg/300 mL  Status:  Discontinued        1,500 mg 150 mL/hr over 120 Minutes Intravenous Every 12 hours 10/09/19 1237 10/10/19 1121   10/09/19 0800  ceFEPIme (MAXIPIME) 2 g in sodium chloride 0.9 % 100 mL IVPB        2 g 200 mL/hr over 30 Minutes Intravenous Every 8 hours 10/09/19 0752 10/15/19 2147   10/09/19 0800  vancomycin (VANCOREADY) IVPB 2000 mg/400 mL        2,000 mg 200 mL/hr over 120 Minutes Intravenous  Once 10/09/19 0752 10/09/19 1147   10/07/19 1540  ceFAZolin (ANCEF) 2-4 GM/100ML-% IVPB       Note to Pharmacy: 10/09/19   : cabinet override      10/07/19 1540 10/08/19 0344   10/07/19 1400  ceFAZolin (ANCEF) IVPB 2g/100 mL premix        2 g 200 mL/hr over 30 Minutes Intravenous To Radiology 10/06/19 1339 10/07/19 1627   09/30/19 0300  vancomycin (VANCOCIN) IVPB 1000 mg/200 mL premix  Status:  Discontinued        1,000 mg 200 mL/hr over 60 Minutes Intravenous Every 8 hours 09/29/19 1809 10/01/19 0853  09/29/19 1815  ceFEPIme (MAXIPIME) 2 g in sodium chloride 0.9 % 100 mL IVPB        2 g 200 mL/hr over 30 Minutes Intravenous Every 8 hours 09/29/19 1803 10/05/19 1801   09/29/19 1815  vancomycin (VANCOREADY) IVPB 2000 mg/400 mL        2,000 mg 200 mL/hr over 120 Minutes Intravenous  Once 09/29/19 1803 09/29/19 2137   09/24/19 2200  cefTRIAXone (ROCEPHIN) 2 g in sodium chloride 0.9 % 100 mL IVPB        2 g 200 mL/hr over 30 Minutes Intravenous Daily at bedtime 09/24/19 1201 09/28/19 2338   09/24/19 0330  cefTRIAXone (ROCEPHIN) 1 g in sodium chloride 0.9 % 100 mL IVPB  Status:  Discontinued        1 g 200 mL/hr over 30 Minutes Intravenous Daily at bedtime 09/24/19 0245 09/24/19 1201   09/24/19 0300  azithromycin  (ZITHROMAX) 500 mg in sodium chloride 0.9 % 250 mL IVPB  Status:  Discontinued        500 mg 250 mL/hr over 60 Minutes Intravenous Daily at bedtime 09/24/19 0245 09/24/19 1103       Subjective: Patient is awake and verbalizes that she is ready to go home.  She continues to try to get out of bed and has to be constantly redirected.  She is requiring almost one-on-one care for her safety.  Objective: Vitals:   11/08/19 0441 11/08/19 0615 11/08/19 0735 11/08/19 1008  BP:  (!) 95/47 120/68 119/69  Pulse:   65 92  Resp:   16   Temp:  98.5 F (36.9 C) 97.6 F (36.4 C)   TempSrc:  Oral Oral   SpO2:  99% 98%   Weight: 88.7 kg     Height:        Intake/Output Summary (Last 24 hours) at 11/08/2019 1321 Last data filed at 11/08/2019 0643 Gross per 24 hour  Intake 624 ml  Output --  Net 624 ml   Filed Weights   11/06/19 0500 11/07/19 0104 11/08/19 0441  Weight: 90.1 kg 91.7 kg 88.7 kg    Examination:  General exam: Appears calm and comfortable  Respiratory system: Clear to auscultation. Respiratory effort normal. Cardiovascular system: S1 & S2 heard, RRR.  Gastrointestinal system: Abdomen is nondistended, soft and nontender.  Central nervous system: Awake alert, dysarthric, moving all extremities.  Some truncal weakness is noted although her legs have excellent strength.  There are some mild contractures noted Extremities: Symmetric  Skin: No rashes Psychiatry: Judgement and insight appear normal. Mood & affect appropriate.     Data Reviewed: I have personally reviewed following labs and imaging studies  CBC: Recent Labs  Lab 11/02/19 0202 11/05/19 0428  WBC 8.7 8.1  HGB 10.5* 11.3*  HCT 34.4* 36.3  MCV 84.3 84.8  PLT 539* 523*   Basic Metabolic Panel: Recent Labs  Lab 11/02/19 0202 11/05/19 0428  NA 139 140  K 4.3 4.0  CL 105 101  CO2 23 27  GLUCOSE 93 109*  BUN 12 10  CREATININE 0.49 0.71  CALCIUM 9.6 10.2  MG  --  1.8   GFR: Estimated Creatinine  Clearance: 122.1 mL/min (by C-G formula based on SCr of 0.71 mg/dL). Liver Function Tests: Recent Labs  Lab 11/05/19 0428  AST 32  ALT 54*  ALKPHOS 118  BILITOT 0.3  PROT 7.6  ALBUMIN 3.5   CBG: Recent Labs  Lab 11/07/19 2026 11/08/19 0102 11/08/19 0439 11/08/19 0800 11/08/19 1145  GLUCAP 89 115* 95 170* 105*      Radiology Studies: No results found.   Scheduled Meds: . aspirin  81 mg Per Tube Daily  . atorvastatin  80 mg Per Tube Daily  . carvedilol  6.25 mg Per Tube BID WC  . dapagliflozin propanediol  5 mg Oral Daily  . digoxin  0.125 mg Per Tube Daily  . enoxaparin (LOVENOX) injection  40 mg Subcutaneous Q24H  . feeding supplement (OSMOLITE 1.5 CAL)  237 mL Per Tube 6 X Daily  . feeding supplement (PROSource TF)  45 mL Per Tube TID  . hydrOXYzine  10 mg Per Tube BID  . influenza vac split quadrivalent PF  0.5 mL Intramuscular Tomorrow-1000  . insulin aspart  0-15 Units Subcutaneous Q4H  . OXcarbazepine  75 mg Per Tube BID  . prenatal multivitamin  1 tablet Per Tube Q1200  . sacubitril-valsartan  1 tablet Per Tube BID  . spironolactone  25 mg Per Tube Daily   Continuous Infusions: . sodium chloride Stopped (11/06/19 1900)     LOS: 45 days    Reva Bores, MD 11/08/2019 1:21 PM [phone] [facility] If 7PM-7AM, please contact night-coverage 11/08/2019, 1:21 PM

## 2019-11-08 NOTE — Progress Notes (Signed)
Pt bed enclosure restraint delivered to unit per MD order.  Mother has requested it only be used when family member can not be in room to sit with Pt.  Mother at bedside all day, no restraints used.

## 2019-11-09 DIAGNOSIS — J9601 Acute respiratory failure with hypoxia: Secondary | ICD-10-CM | POA: Diagnosis not present

## 2019-11-09 DIAGNOSIS — I5041 Acute combined systolic (congestive) and diastolic (congestive) heart failure: Secondary | ICD-10-CM | POA: Diagnosis not present

## 2019-11-09 DIAGNOSIS — I472 Ventricular tachycardia: Secondary | ICD-10-CM | POA: Diagnosis not present

## 2019-11-09 DIAGNOSIS — I469 Cardiac arrest, cause unspecified: Secondary | ICD-10-CM | POA: Diagnosis not present

## 2019-11-09 LAB — GLUCOSE, CAPILLARY
Glucose-Capillary: 104 mg/dL — ABNORMAL HIGH (ref 70–99)
Glucose-Capillary: 106 mg/dL — ABNORMAL HIGH (ref 70–99)
Glucose-Capillary: 107 mg/dL — ABNORMAL HIGH (ref 70–99)
Glucose-Capillary: 115 mg/dL — ABNORMAL HIGH (ref 70–99)
Glucose-Capillary: 126 mg/dL — ABNORMAL HIGH (ref 70–99)
Glucose-Capillary: 157 mg/dL — ABNORMAL HIGH (ref 70–99)

## 2019-11-09 NOTE — Progress Notes (Signed)
Mobility Specialist - Progress Note   11/09/19 1350  Mobility  Activity  (Cancel)    The floor is short-staffed today, so +2 assistance cannot be provided for pt's safety. Will attempt to see pt tomorrow.    Mamie Levers Mobility Specialist Mobility Specialist Phone: [phone]

## 2019-11-09 NOTE — Progress Notes (Signed)
Patient ID: Beverly [name], female   DOB: [birth year], 34 y.o.   MRN: [id number]  PROGRESS NOTE    OTHA RICKLES  NAT:[id number] DOB: [birth year] DOA: 09/24/2019 PCP: Patient, No Pcp Per    Brief Narrative:  34 year old female presents to the hospital 2 weeks postpartum with chest pain and in the ED sustained V. fib arrest with V. fib x3 epi x1, EKG showed ST elevation underwent Cath Lab at [facility] showed aneurysmal dilation(dissection?) of the LAD was identified. She was transferred to [facility] for ICU admission.LVEF30% EF in cath lab.  Has since woken up and has evidence of anoxic brain injury.  Significant events Echo 8/18>LVEF 30-35%, septal and apical akinesis distal anterior wall an d mid/distal inferior wall hypokinesis hyperdynamic basal function. LV size mildly dilated.  Patient suffered anoxic brain injury. 8.23:MRI brain  8/30 underwent tracheostomy 8/30 PEG tube placement 9/10: Continues to be encephalopathic, able to open eyes and track but not following any commands and not talking,is moving extremities independently 9/15: Tracheostomy downsized to # 4 cufless. 9/20:Trach CAP trial, PCCM following SNF planned for 9/30- 30 days out from trach 9/21:Patient is showing improvement with the speech therapy and PT-recommendation changed from SNF to inpatient rehab.  9/25: Trach removed.    Assessment & Plan:   Active Problems:   Cardiac arrest (HCC)   Acute combined systolic and diastolic heart failure (HCC)   Ventricular tachycardia, polymorphic (HCC)   Encounter for central line placement   Respiratory failure (HCC)   Acute metabolic encephalopathy   Anoxic brain injury (HCC)   Dysphagia   Physical deconditioning   Acute delirium  V. fib arrest due to anterior MI: -s/pcardiac cath-aneurysm proximal LAD with dissection was culprit. -Continue aspirin, Lipitor,Coreg   Acute systolic congestive heart failure with EF 35%:2/2 MI. Daily weights with  strict I's/O- currently +3 L 98%; unclear true dry weight noting at time of admission patient weighed 228 pounds with current weight 198 pounds Continue Coreg,Entresto Aldactone and digoxin as above. Echocardiogram was completed 8/18 and anticipate will need to be repeated in November 3 month mark  Prediabetes diabetes 2 in obese -HgbA1c 5.7 -With underlying systolic heart failure will initiate Farxiga 5 mg daily  Acute encephalopathy/acute delirium 2/2 anoxic brain injury:  -Had extensive work-up with EEG, MRI brain.  -Initially trach and PEG dependent. But thus far has been decannulated -Oral intake remains inconsistent therefore PEG remains in place -Continues to have episodes of confusion, agitation as well as amnesia.-Patient's mother agreeable to Trileptalasrecommended by psychiatry. She to agrees that avoiding medications such as benzodiazepines would be best for patient. She is agreeable to using scheduled Vistaril and if necessary adding an SSRI -As of 10/1 patient appears somewhat sedated therefore have decreased scheduled Vistaril to 10 mgBID -Have written an order to allow family members to sit with patient overnight between the hours of 7 PM and 7 AM to aid in her confusion and agitated and aggressive behaviors -Have also discussed with her mother to try to gradually bring the patient's children in to visit with her since this may help with her anxiety as well as help improve her amnesia symptoms. Of note overnight patient became extremely agitated and was requesting to go home. -Have also placed order that to help improve patient's socialization she may be out of bed to chair and sit at nursing station -10/1 have asked CIR to reevaluate the patient now that she is more awake and participating with therapies CIR declined to  admit the patient.  Recommend to see if she qualifies for inpatient [facility].  Awaiting to hear back.  Due to her ongoing confusion trying to get out of  bed we have ordered an enclosure bed for her that she can be a little more active. Family has asked that we not use that if they are available and will sit with her to keep her calm.  Dysphagia -SLP following and recommended to advance to full liquid diet 9/28. -Patient continues to require verbal cues to consistently keep her mouth open to except a bolus when being fed. She is also been assisted with self-feeding by hand over hand methodology. She continues to require significant encouragement to increase bolus volume and to open her mouth. -Mother encouraged to bring in any favorite foods of the patient that would be consistent with a full liquid diet such as a milkshake, etc.  Physical deconditioning 2/2 anoxic brain injury -PT documents patient continuing to progress with therapy goals. She continues to require mod assist of two providersto complete bed mobility and sit to stand from the edge of the bed and utilizes the [place] device for safety and to maintain upright seated and standing position -Tolerating longer episodes of therapy but becomes fatigued with heart rates into the 140s-we will need aggressive inpatient rehabilitation  Nutrition Status: Nutrition Problem: Increased nutrient needs Etiology: post-op healing Signs/Symptoms: estimated needs Interventions: Tube feeding Estimated body mass index is 29.01 kg/m as calculated from the following: Height as of this encounter: 5\' 10"  (1.778 m). Weight as of this encounter: 91.7 kg.  Hypokalemia/hypomagnesemia: -Resolved.  Hypertension: -BPs fairly stable in 90s to 100s.  -Entresto dose discontinued for suboptimal blood pressure readings  -Continue Coreg, Aldactone, and Entresto.  History of SVT  -Maintaining sinus rhythm with beta-blocker described above  Diarrhea  -Resolved.  ?Aspiration pneumonia -Has completed antibiotics    DVT prophylaxis: Lovenox SQ Code Status: Full code  Family  Communication: Significant other at bedside Disposition Plan: SNF versus inpatient rehab  Patient remains inpatient due to unsafe discharge plan   Consultants:   Cardiology  Inpatient rehab  Procedures:  Left heart cath no coronary artery occlusion or stenosis, aneurysmal dilation of the proximal LAD with possible ulceration and dissection, no significant disease in the left circumflex or right coronary artery  2D echo septal and apical akinesis, inferior wall hypokinesis EF is 30 to 35%, right ventricular systolic function is normal, mitral valve is normal, aortic valve is without regurgitation, inferior vena cava is normal  EEG shows diffuse encephalopathy without seizures  Intubation  Trach  Peg tube placement  Antimicrobials: Anti-infectives (From admission, onward)   Start     Dose/Rate Route Frequency Ordered Stop   10/09/19 2100  vancomycin (VANCOREADY) IVPB 1500 mg/300 mL  Status:  Discontinued        1,500 mg 150 mL/hr over 120 Minutes Intravenous Every 12 hours 10/09/19 1237 10/10/19 1121   10/09/19 0800  ceFEPIme (MAXIPIME) 2 g in sodium chloride 0.9 % 100 mL IVPB        2 g 200 mL/hr over 30 Minutes Intravenous Every 8 hours 10/09/19 0752 10/15/19 2147   10/09/19 0800  vancomycin (VANCOREADY) IVPB 2000 mg/400 mL        2,000 mg 200 mL/hr over 120 Minutes Intravenous  Once 10/09/19 0752 10/09/19 1147   10/07/19 1540  ceFAZolin (ANCEF) 2-4 GM/100ML-% IVPB       Note to Pharmacy: 10/09/19   : cabinet override  10/07/19 1540 10/08/19 0344   10/07/19 1400  ceFAZolin (ANCEF) IVPB 2g/100 mL premix        2 g 200 mL/hr over 30 Minutes Intravenous To Radiology 10/06/19 1339 10/07/19 1627   09/30/19 0300  vancomycin (VANCOCIN) IVPB 1000 mg/200 mL premix  Status:  Discontinued        1,000 mg 200 mL/hr over 60 Minutes Intravenous Every 8 hours 09/29/19 1809 10/01/19 0853   09/29/19 1815  ceFEPIme (MAXIPIME) 2 g in sodium chloride 0.9 % 100 mL IVPB        2  g 200 mL/hr over 30 Minutes Intravenous Every 8 hours 09/29/19 1803 10/05/19 1801   09/29/19 1815  vancomycin (VANCOREADY) IVPB 2000 mg/400 mL        2,000 mg 200 mL/hr over 120 Minutes Intravenous  Once 09/29/19 1803 09/29/19 2137   09/24/19 2200  cefTRIAXone (ROCEPHIN) 2 g in sodium chloride 0.9 % 100 mL IVPB        2 g 200 mL/hr over 30 Minutes Intravenous Daily at bedtime 09/24/19 1201 09/28/19 2338   09/24/19 0330  cefTRIAXone (ROCEPHIN) 1 g in sodium chloride 0.9 % 100 mL IVPB  Status:  Discontinued        1 g 200 mL/hr over 30 Minutes Intravenous Daily at bedtime 09/24/19 0245 09/24/19 1201   09/24/19 0300  azithromycin (ZITHROMAX) 500 mg in sodium chloride 0.9 % 250 mL IVPB  Status:  Discontinued        500 mg 250 mL/hr over 60 Minutes Intravenous Daily at bedtime 09/24/19 0245 09/24/19 1103       Subjective: Patient is awake and alert she is talking on phone with her family and her significant other who is by her side.  Objective: Vitals:   11/08/19 2356 11/09/19 0400 11/09/19 0904 11/09/19 1653  BP: 105/69 115/80 111/75 106/86  Pulse: (!) 104 (!) 103 (!) 102 100  Resp: 16 17 19 20   Temp: 98.7 F (37.1 C) 98.4 F (36.9 C) 98.2 F (36.8 C) 100.1 F (37.8 C)  TempSrc: Oral Oral Oral Axillary  SpO2: 100% 98% 96% 95%  Weight:      Height:       No intake or output data in the 24 hours ending 11/09/19 1717 Filed Weights   11/06/19 0500 11/07/19 0104 11/08/19 0441  Weight: 90.1 kg 91.7 kg 88.7 kg    Examination:  General exam: Appears calm and comfortable  Respiratory system: Clear to auscultation. Respiratory effort normal. Cardiovascular system: S1 & S2 heard, RRR.  Gastrointestinal system: Abdomen is nondistended, soft and nontender.  Central nervous system: Awake and alert, calm, dysarthric, moving all extremities, some truncal weakness, contractures especially in her right hand. Extremities: Symmetric  Skin: No rashes   Data Reviewed: I have personally  reviewed following labs and imaging studies  CBC: Recent Labs  Lab 11/05/19 0428  WBC 8.1  HGB 11.3*  HCT 36.3  MCV 84.8  PLT 523*   Basic Metabolic Panel: Recent Labs  Lab 11/05/19 0428  NA 140  K 4.0  CL 101  CO2 27  GLUCOSE 109*  BUN 10  CREATININE 0.71  CALCIUM 10.2  MG 1.8   GFR: Estimated Creatinine Clearance: 122.1 mL/min (by C-G formula based on SCr of 0.71 mg/dL). Liver Function Tests: Recent Labs  Lab 11/05/19 0428  AST 32  ALT 54*  ALKPHOS 118  BILITOT 0.3  PROT 7.6  ALBUMIN 3.5   CBG: Recent Labs  Lab 11/09/19 0010 11/09/19  [id number] 11/09/19 0756 11/09/19 1250 11/09/19 1622  GLUCAP 115* 107* 157* 104* 126*      Radiology Studies: No results found.   Scheduled Meds: . aspirin  81 mg Per Tube Daily  . atorvastatin  80 mg Per Tube Daily  . carvedilol  6.25 mg Per Tube BID WC  . dapagliflozin propanediol  5 mg Oral Daily  . digoxin  0.125 mg Per Tube Daily  . enoxaparin (LOVENOX) injection  40 mg Subcutaneous Q24H  . feeding supplement (OSMOLITE 1.5 CAL)  237 mL Per Tube 6 X Daily  . feeding supplement (PROSource TF)  45 mL Per Tube TID  . hydrOXYzine  10 mg Per Tube BID  . influenza vac split quadrivalent PF  0.5 mL Intramuscular Tomorrow-1000  . insulin aspart  0-15 Units Subcutaneous Q4H  . OXcarbazepine  75 mg Per Tube BID  . prenatal multivitamin  1 tablet Per Tube Q1200  . sacubitril-valsartan  1 tablet Per Tube BID  . spironolactone  25 mg Per Tube Daily   Continuous Infusions: . sodium chloride Stopped (11/06/19 1900)     LOS: 46 days    Reva Boresanya S Kathye Cipriani, MD 11/09/2019 5:17 PM [phone] [facility] If 7PM-7AM, please contact night-coverage 11/09/2019, 5:17 PM

## 2019-11-10 DIAGNOSIS — I469 Cardiac arrest, cause unspecified: Secondary | ICD-10-CM | POA: Diagnosis not present

## 2019-11-10 DIAGNOSIS — R5381 Other malaise: Secondary | ICD-10-CM | POA: Diagnosis not present

## 2019-11-10 LAB — GLUCOSE, CAPILLARY
Glucose-Capillary: 108 mg/dL — ABNORMAL HIGH (ref 70–99)
Glucose-Capillary: 108 mg/dL — ABNORMAL HIGH (ref 70–99)
Glucose-Capillary: 115 mg/dL — ABNORMAL HIGH (ref 70–99)
Glucose-Capillary: 167 mg/dL — ABNORMAL HIGH (ref 70–99)
Glucose-Capillary: 86 mg/dL (ref 70–99)

## 2019-11-10 NOTE — Progress Notes (Signed)
[facility] PROGRESS NOTE  Beverly [name] LKG:[id number] DOB: 07/22/1985 DOA: 09/24/2019 PCP: Patient, No Pcp Per  Status: Inpatient-Remains inpatient appropriate because:Altered mental status, Ongoing diagnostic testing needed not appropriate for outpatient work up, Unsafe d/c plan, IV treatments appropriate due to intensity of illness or inability to take PO and Inpatient level of care appropriate due to severity of illness   Dispo: The patient is from: Home              Anticipated d/c is to: SNF vs inpatient rehabilitation at outside facility              Anticipated d/c date is: > 3 days              Patient currently is not medically stable to d/c.  Continues to have episodes of acute delirium and agitation in the context of anoxic brain injury but as of 10/1 these episodes are improving after the addition of appropriate pharmacotherapy. PT and OT have seen patient and recommended inpatient rehabilitation vs SNF placement once otherwise medically stable.  Case has been reviewed by [facility] and they felt they could not provide the level of care the patient needed.  Other local inpatient rehab options are being explored.  Unfortunately [facility] is not "in network" for patient's insurance coverage.  Medicaid application is also pending.   Code Status: Full Family Communication: Mother at bedside 10/4 DVT prophylaxis: Lovenox Vaccination status: Has not received Covid vaccine  HPI: 34 year old female presents to the hospital 2 weeks postpartum with chest pain and in the ED sustained V. fib arrest with V. fib x3 epi x1, EKG showed ST elevation [facility] showed aneurysmal dilation(dissection?) of the LAD was identified. [facility]. LVEF 30% EF in cath lab.    Significant events Echo 8/18> LVEF 30-35%, septal and apical akinesis distal anterior wall an d mid/distal inferior wall hypokinesis hyperdynamic basal function. LV  size mildly dilated.  Patient suffered anoxic brain injury. 8.23:MRI brain  8/30 underwent tracheostomy 8/30 PEG tube placement 9/10: Continues to be encephalopathic, able to open eyes and track but not following any commands and not talking,is moving extremities independently 9/15: Tracheostomy downsized to # 4 cufless. 9/20:Trach CAP trial, PCCM following SNF planned for 9/30- 30 days out from trach 9/21:Patient is showing improvement with the speech therapy and PT-recommendation changed from SNF to inpatient rehab.  9/25: Trach removed.  Subjective: Patient awake and very slowly responds verbally to questions asked.  Maintains eye contact.  Confused believing she was at home.  Did smile appropriately when jokes made.  Objective: Vitals:   11/10/19 0400 11/10/19 0736  BP: 117/77 105/70  Pulse: 98 (!) 104  Resp: 16   Temp: 98.2 F (36.8 C) 98.1 F (36.7 C)  SpO2: 97% 97%   No intake or output data in the 24 hours ending 11/10/19 1204 Filed Weights   11/06/19 0500 11/07/19 0104 11/08/19 0441  Weight: 90.1 kg 91.7 kg 88.7 kg    Exam:  Constitutional: NAD, calm, appears to be comfortable and somewhat sedated Respiratory: clear to auscultation bilaterally. Normal respiratory effort. Room air Cardiovascular: Regular rate and rhythm, no murmurs / rubs / gallops. No extremity edema. 2+ pedal pulses.   Abdomen: no tenderness, no masses palpated. Bowel sounds positive.  PEG tube in place-abdominal binder available if needed Musculoskeletal: no clubbing / cyanosis. No joint deformity upper and lower extremities. no contractures. Normal muscle  tone.  Skin: no rashes, lesions, ulcers. No induration Neurologic: CN 2-12 grossly intact although unable to accurately assess visual acuity given patient's limited ability to participate (patient wore glasses prior to admission), mostly nonverbal-Sensation grossly intact, no evidence of extremity spasms on exam.  Moving all extremities  spontaneously x4.  Patient left hand dominant and when checked today left side strength including shoulder shrug appears to be weaker at 3/5 with right side strength 4/5. Psychiatric: Awake and slow to respond, attempts to engage in conversation by making eye contact.  Responses initially were whispered but with mother's encouragement patient increased volume of voice to be better heard.  Assessment/Plan: V. fib arrest due to anterior MI: -s/p cardiac cath- aneurysm proximal LAD with dissection was culprit. -Continue aspirin, Lipitor,Coreg    Acute systolic congestive heart failure with EF 35%: 2/2 MI.   Daily weights with strict I's/O- has remained stable from a respiratory standpoint with persistent +3 L balance; unclear true dry weight noting at time of admission patient weighed 228 pounds with current weight 198 pounds Continue Coreg,Entresto Aldactone  and digoxin as above. Echocardiogram was completed 8/18 and anticipate will need to be repeated in November 3 month mark  Prediabetes diabetes 2 in obese -HgbA1c 5.7 -With underlying systolic heart failure will initiate Farxiga 5 mg daily   Acute encephalopathy/acute delirium  2/2 anoxic brain injury:   -Had extensive work-up with EEG, MRI brain.   -Initially trach and PEG dependent.  But thus far has been decannulated -Oral intake remains inconsistent therefore PEG remains in place -Continues to have episodes of confusion, agitation as well as amnesia.  -Continue Trileptal as recommended by psychiatry. -Continue Vistaril for anxiety -Have written an order to allow family members to sit with patient overnight between the hours of 7 PM and 7 AM to aid in her confusion and agitated and aggressive behaviors -Therapies recommend inpatient rehabilitation but due to patient's insurance and current rehab needs have been unable to locate appropriate facility  Dysphagia -SLP following and recommended to advance to full liquid diet  9/28. -Patient continues to require verbal cues to consistently keep her mouth open to except a bolus when being fed.  She is also been assisted with self-feeding by hand over hand methodology.  She continues to require significant encouragement to increase bolus volume and to open her mouth.  Physical deconditioning 2/2 anoxic brain injury -PT documents patient continuing to progress with therapy goals.  She continues to require mod assist of two providers to complete bed mobility and sit to stand from the edge of the bed and utilizes the [place] device for safety and to maintain upright seated and standing position -Tolerating longer episodes of therapy but becomes fatigued with heart rates into the 140s-recommendation is for aggressive inpatient rehabilitation  Nutrition Status: Nutrition Problem: Increased nutrient needs Etiology: post-op healing Signs/Symptoms: estimated needs Interventions: Tube feeding Estimated body mass index is 28.06 kg/m as calculated from the following:   Height as of this encounter: 5\' 10"  (1.778 m).   Weight as of this encounter: 88.7 kg.   Hypokalemia/hypomagnesemia:  -Resolved.   Hypertension:  -BPs fairly stable in 90s to 100s.  -Entresto dose discontinued for suboptimal blood pressure readings   -Continue Coreg Aldactone and Entresto.   History of SVT  -Maintaining sinus rhythm with beta-blocker described above   Diarrhea  -Resolved.   ?Aspiration pneumonia  -Has completed completed antibiotics     Data Reviewed: Basic Metabolic Panel: Recent Labs  Lab 11/05/19 0428  NA 140  K 4.0  CL 101  CO2 27  GLUCOSE 109*  BUN 10  CREATININE 0.71  CALCIUM 10.2  MG 1.8   Liver Function Tests: Recent Labs  Lab 11/05/19 0428  AST 32  ALT 54*  ALKPHOS 118  BILITOT 0.3  PROT 7.6  ALBUMIN 3.5   No results for input(s): LIPASE, AMYLASE in the last 168 hours. No results for input(s): AMMONIA in the last 168 hours. CBC: Recent Labs  Lab  11/05/19 0428  WBC 8.1  HGB 11.3*  HCT 36.3  MCV 84.8  PLT 523*   Cardiac Enzymes: No results for input(s): CKTOTAL, CKMB, CKMBINDEX, TROPONINI in the last 168 hours. BNP (last 3 results) Recent Labs    10/13/19 0440 10/14/19 0500 10/15/19 0552  BNP 186.1* 200.4* 168.9*    ProBNP (last 3 results) No results for input(s): PROBNP in the last 8760 hours.  CBG: Recent Labs  Lab 11/09/19 1622 11/09/19 2021 11/10/19 0010 11/10/19 0459 11/10/19 0733  GLUCAP 126* 106* 108* 115* 167*    No results found for this or any previous visit (from the past 240 hour(s)).   Studies: No results found.  Scheduled Meds: . aspirin  81 mg Per Tube Daily  . atorvastatin  80 mg Per Tube Daily  . carvedilol  6.25 mg Per Tube BID WC  . dapagliflozin propanediol  5 mg Oral Daily  . digoxin  0.125 mg Per Tube Daily  . enoxaparin (LOVENOX) injection  40 mg Subcutaneous Q24H  . feeding supplement (OSMOLITE 1.5 CAL)  237 mL Per Tube 6 X Daily  . feeding supplement (PROSource TF)  45 mL Per Tube TID  . hydrOXYzine  10 mg Per Tube BID  . influenza vac split quadrivalent PF  0.5 mL Intramuscular Tomorrow-1000  . insulin aspart  0-15 Units Subcutaneous Q4H  . OXcarbazepine  75 mg Per Tube BID  . prenatal multivitamin  1 tablet Per Tube Q1200  . sacubitril-valsartan  1 tablet Per Tube BID  . spironolactone  25 mg Per Tube Daily   Continuous Infusions: . sodium chloride Stopped (11/06/19 1900)    Active Problems:   Cardiac arrest (HCC)   Acute combined systolic and diastolic heart failure (HCC)   Ventricular tachycardia, polymorphic (HCC)   Encounter for central line placement   Respiratory failure (HCC)   Acute metabolic encephalopathy   Anoxic brain injury [facility])   Dysphagia   Physical deconditioning   Acute delirium   Consultants:  Cardiology  PCCM  Interventional radiology  Neurology  CIR  Psychiatry  Procedures:  8/17 cardiac catheterization  8/18 EEG  8/18  echocardiogram 1. Septal and apical akinesis Distal anterior wall and mid/distal inferior wall hypokinesis Hyperdynamic basal function . Left ventricular ejection fraction, by estimation, is 30 to 35%. The left ventricle has moderately decreased function. The left ventricle demonstrates regional wall motion abnormalities (see scoring diagram/findings for description). The left ventricular internal cavity size was mildly dilated. Left ventricular diastolic parameters are indeterminate. 2. Right ventricular systolic function is normal. The right ventricular size is normal. 3. The mitral valve is normal in structure. Trivial mitral valve regurgitation. No evidence of mitral stenosis. 4. The aortic valve was not well visualized. Aortic valve regurgitation is not visualized. No aortic stenosis is present. 5. The inferior vena cava is normal in size with greater  8/19 continuous EEG  8/22 EEG  8/31 EEG  8/31 gastrostomy tube placement per interventional radiology  Antibiotics: Anti-infectives (From admission, onward)  Start     Dose/Rate Route Frequency Ordered Stop   10/09/19 2100  vancomycin (VANCOREADY) IVPB 1500 mg/300 mL  Status:  Discontinued        1,500 mg 150 mL/hr over 120 Minutes Intravenous Every 12 hours 10/09/19 1237 10/10/19 1121   10/09/19 0800  ceFEPIme (MAXIPIME) 2 g in sodium chloride 0.9 % 100 mL IVPB        2 g 200 mL/hr over 30 Minutes Intravenous Every 8 hours 10/09/19 0752 10/15/19 2147   10/09/19 0800  vancomycin (VANCOREADY) IVPB 2000 mg/400 mL        2,000 mg 200 mL/hr over 120 Minutes Intravenous  Once 10/09/19 0752 10/09/19 1147   10/07/19 1540  ceFAZolin (ANCEF) 2-4 GM/100ML-% IVPB       Note to Pharmacy: Erie Noe   : cabinet override      10/07/19 1540 10/08/19 0344   10/07/19 1400  ceFAZolin (ANCEF) IVPB 2g/100 mL premix        2 g 200 mL/hr over 30 Minutes Intravenous To Radiology 10/06/19 1339 10/07/19 1627   09/30/19 0300  vancomycin (VANCOCIN)  IVPB 1000 mg/200 mL premix  Status:  Discontinued        1,000 mg 200 mL/hr over 60 Minutes Intravenous Every 8 hours 09/29/19 1809 10/01/19 0853   09/29/19 1815  ceFEPIme (MAXIPIME) 2 g in sodium chloride 0.9 % 100 mL IVPB        2 g 200 mL/hr over 30 Minutes Intravenous Every 8 hours 09/29/19 1803 10/05/19 1801   09/29/19 1815  vancomycin (VANCOREADY) IVPB 2000 mg/400 mL        2,000 mg 200 mL/hr over 120 Minutes Intravenous  Once 09/29/19 1803 09/29/19 2137   09/24/19 2200  cefTRIAXone (ROCEPHIN) 2 g in sodium chloride 0.9 % 100 mL IVPB        2 g 200 mL/hr over 30 Minutes Intravenous Daily at bedtime 09/24/19 1201 09/28/19 2338   09/24/19 0330  cefTRIAXone (ROCEPHIN) 1 g in sodium chloride 0.9 % 100 mL IVPB  Status:  Discontinued        1 g 200 mL/hr over 30 Minutes Intravenous Daily at bedtime 09/24/19 0245 09/24/19 1201   09/24/19 0300  azithromycin (ZITHROMAX) 500 mg in sodium chloride 0.9 % 250 mL IVPB  Status:  Discontinued        500 mg 250 mL/hr over 60 Minutes Intravenous Daily at bedtime 09/24/19 0245 09/24/19 1103       Time spent: 20    Junious Silk ANP  [facility] Pager [phone]. If 7PM-7AM, please contact night-coverage at [email or web address] 11/10/2019, 12:04 PM  LOS: 47 days

## 2019-11-10 NOTE — Progress Notes (Signed)
Pt bed enclosure arrived 10/2; Explained to family the purpose of bed is for patient safety. Family has refused the use of the bed.  The bed will be returned today.

## 2019-11-10 NOTE — Progress Notes (Signed)
Physical Therapy Treatment Patient Details Name: Beverly [name] MRN: [id number] DOB: [birth year] Today's Date: 11/10/2019    History of Present Illness Pt is a 34 y.o. female admitted 09/23/19 with intermittent chest discomfort; of note, pt 2-weeks post-partum (had emergent C-section); while in ED, pt with VF cardiac arrest requiring intubation. Brain MRI 8/23 with no acute abnormality. S/p trach and PEG tube 8/30. Course complicated by encephalopathy, anoxic brain injury, enterobacterial PNA. Transferred out of ICU On 9/4. PMH includes HTN.    PT Comments    Pt awake with mother present throughout session. Pt remains confused, impulsive with impaired speech but continues to progress with activity. Initial stand requiring mod +2 assist with progression to min assist with repeated trials and increased gait distance today. Plan remains appropriate with mom present beside pt in chair end of session.     Follow Up Recommendations  CIR;Supervision/Assistance - 24 hour     Equipment Recommendations  Other (comment) (TBD)    Recommendations for Other Services       Precautions / Restrictions Precautions Precautions: Fall Restrictions Weight Bearing Restrictions: No    Mobility  Bed Mobility Overal bed mobility: Needs Assistance Bed Mobility: Supine to Sit     Supine to sit: Mod assist     General bed mobility comments: mod assist to control direction of transfers with cues and assist to bring legs off bed prior to lifting trunk. Pt with initial posterior right lean but improved with tactile cues and facilitation EOB  Transfers Overall transfer level: Needs assistance   Transfers: Sit to/from Stand Sit to Stand: Mod assist;+2 physical assistance         General transfer comment: initial stand from bed with MOd +2 assist with cues for sequence and assist to rise. 2-4th trials from chair min +2 with hand over hand placement for hands on armrests to push  up  Ambulation/Gait Ambulation/Gait assistance: Mod assist;+2 physical assistance Gait Distance (Feet): 40 Feet Assistive device: 2 person hand held assist (3 musketeer gait) Gait Pattern/deviations: Step-to pattern;Shuffle;Trunk flexed   Gait velocity interpretation: 1.31 - 2.62 ft/sec, indicative of limited community ambulator General Gait Details: pt with decreased bil UE control and support during gait with assist at pelvis to control sequence and stability. chair follow for safety   Stairs             Wheelchair Mobility    Modified Rankin (Stroke Patients Only)       Balance Overall balance assessment: Needs assistance Sitting-balance support: Bilateral upper extremity supported;Feet supported Sitting balance-Leahy Scale: Poor     Standing balance support: Bilateral upper extremity supported Standing balance-Leahy Scale: Poor                              Cognition Arousal/Alertness: Awake/alert Behavior During Therapy: Impulsive Overall Cognitive Status: Impaired/Different from baseline Area of Impairment: Orientation;Problem solving;Attention;[place] level;Following commands;Safety/judgement;Awareness;Memory                 Orientation Level: Disoriented to;Place;Time;Situation Current Attention Level: Focused   Following Commands: Follows one step commands inconsistently Safety/Judgement: Decreased awareness of safety;Decreased awareness of deficits Awareness: Intellectual Problem Solving: Difficulty sequencing;Requires verbal cues;Requires tactile cues General Comments: pt unaware of who mother is in room. Following single step commands for mobility with increased accuracy with LUE than RUE. pt soft spoken with limited intelligible phrases      Exercises General Exercises - Lower Extremity Long Arc Quad: AROM;Both;5  reps;Seated    General Comments        Pertinent Vitals/Pain Pain Assessment: No/denies pain    Home Living                       Prior Function            PT Goals (current goals can now be found in the care plan section) Progress towards PT goals: Progressing toward goals    Frequency    Min 3X/week      PT Plan Current plan remains appropriate    Co-evaluation              AM-PAC PT "6 Clicks" Mobility   Outcome Measure  Help needed turning from your back to your side while in a flat bed without using bedrails?: A Lot Help needed moving from lying on your back to sitting on the side of a flat bed without using bedrails?: A Lot Help needed moving to and from a bed to a chair (including a wheelchair)?: A Lot Help needed standing up from a chair using your arms (e.g., wheelchair or bedside chair)?: A Lot Help needed to walk in hospital room?: A Lot Help needed climbing 3-5 steps with a railing? : Total 6 Click Score: 11    End of Session Equipment Utilized During Treatment: Gait belt Activity Tolerance: Patient tolerated treatment well Patient left: with call bell/phone within reach;with family/visitor present;in chair;with chair alarm set;with nursing/sitter in room Nurse Communication: Mobility status PT Visit Diagnosis: Other abnormalities of gait and mobility (R26.89);Difficulty in walking, not elsewhere classified (R26.2);Other symptoms and signs involving the nervous system (R29.898)     Time: 2010-0712 PT Time Calculation (min) (ACUTE ONLY): 24 min  Charges:  $Gait Training: 8-22 mins $Therapeutic Activity: 8-22 mins                     Rook Maue P, PT Acute Rehabilitation Services Pager: [phone] Office: [phone]    Margret Moat B Darris Staiger 11/10/2019, 1:09 PM

## 2019-11-10 NOTE — Progress Notes (Signed)
Occupational Therapy Treatment Patient Details Name: Beverly [name] MRN: [id number] DOB: [birth year] Today's Date: 11/10/2019    History of present illness Pt is a 34 y.o. female admitted 09/23/19 with intermittent chest discomfort; of note, pt 2-weeks post-partum (had emergent C-section); while in ED, pt with VF cardiac arrest requiring intubation. Brain MRI 8/23 with no acute abnormality. S/p trach and PEG tube 8/30. Course complicated by encephalopathy, anoxic brain injury, enterobacterial PNA. Transferred out of ICU On 9/4. PMH includes HTN.   OT comments  Patient met lying supine in bed with mother present at bedside. Patient unable to visually track OT in R or L visual field with preferred R gaze. Patient also unable to follow 1-step verbal commands this treatment session. Mother present at bedside reports earlier episode of agitation requiring medication administration. Since then, patient has been in a state of obtundation. +2 assist for anterior scoot toward head of bed and for rolling R<>L in supine. PROM to BUE to facilitate soft tissue elongation and decrease risk of contracture. Session concluded with patient lying supine in bed with call bell within reach, bed alarm activated, and mother present at bedside. Patient would benefit from continued acute OT services to maximize independence with self-care tasks and decrease caregiver burden in prep for safe d/c to next level of care.    Follow Up Recommendations  CIR    Equipment Recommendations  Other (comment) (Defer to next level of care)    Recommendations for Other Services Rehab consult    Precautions / Restrictions Precautions Precautions: Fall Precaution Comments: incontinence Restrictions Weight Bearing Restrictions: No       Mobility Bed Mobility Overal bed mobility: Needs Assistance Bed Mobility: Rolling Rolling: Total assist   Supine to sit: Mod assist     General bed mobility comments: Patient unable to  assist with rolling R<>L and anterior scoot toward HOB. Mother notes that she is usually able to assist with bed mobility. Per chart review, patient Mod A for supine to sit earlier this date.   Transfers Overall transfer level: Needs assistance   Transfers: Sit to/from Stand Sit to Stand: Mod assist;+2 physical assistance         General transfer comment: initial stand from bed with MOd +2 assist with cues for sequence and assist to rise. 2-4th trials from chair min +2 with hand over hand placement for hands on armrests to push up    Balance Overall balance assessment: Needs assistance                                         ADL either performed or assessed with clinical judgement   ADL                                               Vision       Perception     Praxis      Cognition Arousal/Alertness: Lethargic;Suspect due to medications Behavior During Therapy: Impulsive Overall Cognitive Status: Impaired/Different from baseline Area of Impairment: Orientation;Problem solving;Attention;[place] level;Following commands;Safety/judgement;Awareness;Memory                 Orientation Level: Disoriented to;Place;Time;Situation Current Attention Level: Focused   Following Commands: Follows one step commands inconsistently Safety/Judgement: Decreased awareness of safety;Decreased awareness of  deficits Awareness: Intellectual Problem Solving: Difficulty sequencing;Requires verbal cues;Requires tactile cues General Comments: Patient with recent medication administration after bout of aggitation. Patient unable to follow any verbal commands at this time.         Exercises General Exercises - Upper Extremity Shoulder Flexion: PROM;Both;10 reps;Supine Shoulder Horizontal ABduction: PROM;Both;10 reps Elbow Flexion: PROM;Both;10 reps;Supine Elbow Extension: PROM;Both;10 reps;Seated Wrist Flexion: PROM;10 reps;Both;Supine Wrist  Extension: PROM;10 reps;Both;Supine Digit Composite Flexion: PROM;10 reps;Supine Composite Extension: PROM;10 reps;Supine   Shoulder Instructions       General Comments      Pertinent Vitals/ Pain       Pain Assessment: Faces Faces Pain Scale: No hurt  Home Living                                          Prior Functioning/Environment              Frequency  Min 2X/week        Progress Toward Goals  OT Goals(current goals can now be found in the care plan section)  Progress towards OT goals: Progressing toward goals  Acute Rehab OT Goals Patient Stated Goal: Unable to articulate OT Goal Formulation: Patient unable to participate in goal setting Time For Goal Achievement: 11/20/19 Potential to Achieve Goals: Fair ADL Goals Pt Will Perform Grooming: with max assist;sitting Pt/caregiver will Perform Home Exercise Program: Increased strength;Increased ROM;Both right and left upper extremity;With minimal assist;With written HEP provided Additional ADL Goal #1: Pt will follow 1 step command with 25% accuracy. Additional ADL Goal #2: Pt will visually track/attend to meaningful item with max cues.  Plan Frequency remains appropriate;Discharge plan remains appropriate    Co-evaluation                 AM-PAC OT "6 Clicks" Daily Activity     Outcome Measure   Help from another person eating meals?: Total Help from another person taking care of personal grooming?: Total Help from another person toileting, which includes using toliet, bedpan, or urinal?: Total Help from another person bathing (including washing, rinsing, drying)?: Total Help from another person to put on and taking off regular upper body clothing?: Total Help from another person to put on and taking off regular lower body clothing?: Total 6 Click Score: 6    End of Session    OT Visit Diagnosis: Other symptoms and signs involving cognitive function;Muscle weakness  (generalized) (M62.81)   Activity Tolerance Patient limited by lethargy   Patient Left in bed;with call bell/phone within reach;with bed alarm set;with nursing/sitter in room;Other (comment) (Mother present at bedside. )   Nurse Communication          Time: [phone] OT Time Calculation (min): 11 min  Charges: OT General Charges $OT Visit: 1 Visit OT Treatments $Therapeutic Activity: 8-22 mins  Logyn Dedominicis H. OTR/L Supplemental OT, Department of rehab services [phone]   [place]. 11/10/2019, 2:16 PM

## 2019-11-10 NOTE — TOC Progression Note (Signed)
Transition of Care (TOC) - Progression Note  Sander Radon, BSN Transitions of Care Unit 4E- RN Case Manager See Treatment Team for direct phone #    Patient Details  Name: Beverly [name] MRN: [id number] Date of Birth: 07-21-1985  Transition of Care [facility]) CM/SW Contact  Zenda Alpers Lenn Sink, RN Phone Number: 11/10/2019, 2:51 PM  Clinical Narrative:    Follow up done with regards to search for INPT rehab center for patient-  Received call back from [facility]  INPT rehab- they are currently not accepting patients from outside facilities.   Spoke with mom at the bedside- discussed other options for INPT rehab facilities- at this time the next closest would be [facility] which there are 2 in the [place] area- mom agreeable to reach out the them. After that there are no more that are within 1.5 hr from home. Discussed with mom that we may have to start looking at [facility] options however these may be limited as well. Mom voiced understanding. If pt starts to show improvement with mentation and no longer needs medications for agitation and does not need sitters, then could potential reach back out to [facility] INPT rehab to see if they would reassess clinicals. TOC to continue to work on safe, appropriate transition plan.    Expected Discharge Plan: IP Rehab Facility Barriers to Discharge: Continued Medical Work up, SNF Pending Medicaid, SNF Pending bed offer  Expected Discharge Plan and Services Expected Discharge Plan: IP Rehab Facility In-house Referral: Clinical Social Work   Post Acute Care Choice: IP Rehab                                         Social Determinants of Health (SDOH) Interventions    Readmission Risk Interventions No flowsheet data found.

## 2019-11-10 NOTE — Plan of Care (Signed)
  Problem: Safety: Goal: Ability to remain free from injury will improve Outcome: Progressing   Problem: Pain Managment: Goal: General experience of comfort will improve Outcome: Progressing   Problem: Skin Integrity: Goal: Risk for impaired skin integrity will decrease Outcome: Progressing   Problem: Coping: Goal: Level of anxiety will decrease Outcome: Progressing   Problem: Nutrition: Goal: Adequate nutrition will be maintained Outcome: Progressing   Problem: Clinical Measurements: Goal: Respiratory complications will improve Outcome: Progressing

## 2019-11-11 DIAGNOSIS — I469 Cardiac arrest, cause unspecified: Secondary | ICD-10-CM | POA: Diagnosis not present

## 2019-11-11 DIAGNOSIS — R1310 Dysphagia, unspecified: Secondary | ICD-10-CM | POA: Diagnosis not present

## 2019-11-11 LAB — GLUCOSE, CAPILLARY
Glucose-Capillary: 122 mg/dL — ABNORMAL HIGH (ref 70–99)
Glucose-Capillary: 131 mg/dL — ABNORMAL HIGH (ref 70–99)
Glucose-Capillary: 148 mg/dL — ABNORMAL HIGH (ref 70–99)
Glucose-Capillary: 155 mg/dL — ABNORMAL HIGH (ref 70–99)
Glucose-Capillary: 72 mg/dL (ref 70–99)
Glucose-Capillary: 76 mg/dL (ref 70–99)

## 2019-11-11 MED ORDER — MAGNESIUM OXIDE 400 (241.3 MG) MG PO TABS
400.0000 mg | ORAL_TABLET | Freq: Every day | ORAL | Status: AC
  Administered 2019-11-11 – 2019-11-12 (×2): 400 mg
  Filled 2019-11-11 (×2): qty 1

## 2019-11-11 NOTE — Progress Notes (Signed)
[facility] PROGRESS NOTE  Beverly [name] KDT:[id number] DOB: 10-06-1985 DOA: 09/24/2019 PCP: Patient, No Pcp Per  Status: Inpatient-Remains inpatient appropriate because:Altered mental status, Ongoing diagnostic testing needed not appropriate for outpatient work up, Unsafe d/c plan, IV treatments appropriate due to intensity of illness or inability to take PO and Inpatient level of care appropriate due to severity of illness   Dispo: The patient is from: Home              Anticipated d/c is to: SNF vs inpatient rehabilitation at outside facility              Anticipated d/c date is: > 3 days              Patient currently is not medically stable to d/c.  Continues to have episodes of acute delirium and agitation in the context of anoxic brain injury but as of 10/1 these episodes are improving after the addition of appropriate pharmacotherapy. PT and OT have seen patient and recommended inpatient rehabilitation vs SNF placement once otherwise medically stable.  Case has been reviewed by [facility] IP rehab and they felt they could not provide the level of care the patient needed.  Other local inpatient rehab options are being explored.  Unfortunately [facility] is not "in network" for patient's insurance coverage.  Medicaid application is also pending.  **If unable to locate an inpatient rehabilitation facility need to consider discharge to a SNF with focus on management of brain injury**  Code Status: Full Family Communication: Mother at bedside 10/4 DVT prophylaxis: Lovenox Vaccination status: Has not received Covid vaccine  HPI: 34 year old female presents to the hospital 2 weeks postpartum with chest pain and in the ED sustained V. fib arrest with V. fib x3 epi x1, EKG showed ST elevation underwent Cath Lab at [facility] showed aneurysmal dilation(dissection?) of the LAD was identified. She was transferred to [facility] for ICU admission. LVEF 30% EF in cath lab.    Significant events Echo  8/18> LVEF 30-35%, septal and apical akinesis distal anterior wall an d mid/distal inferior wall hypokinesis hyperdynamic basal function. LV size mildly dilated.  Patient suffered anoxic brain injury. 8.23:MRI brain  8/30 underwent tracheostomy 8/30 PEG tube placement 9/10: Continues to be encephalopathic, able to open eyes and track but not following any commands and not talking,is moving extremities independently 9/15: Tracheostomy downsized to # 4 cufless. 9/20:Trach CAP trial, PCCM following SNF planned for 9/30- 30 days out from trach 9/21:Patient is showing improvement with the speech therapy and PT-recommendation changed from SNF to inpatient rehab.  9/25: Trach removed.  Subjective: Patient awake and more interactive today.  Smiles and engages in attempted conversation when discussing her children.  Objective: Vitals:   11/11/19 0458 11/11/19 0940  BP: 109/69 93/71  Pulse: 96 (!) 116  Resp: 18 18  Temp: 98.2 F (36.8 C) 98 F (36.7 C)  SpO2: 100% 95%    Intake/Output Summary (Last 24 hours) at 11/11/2019 1046 Last data filed at 11/10/2019 2200 Gross per 24 hour  Intake 437 ml  Output --  Net 437 ml   Filed Weights   11/07/19 0104 11/08/19 0441 11/11/19 0458  Weight: 91.7 kg 88.7 kg 88.2 kg    Exam:  Constitutional: NAD, calm, comfortable in appearance Respiratory: clear to auscultation bilaterally. Normal respiratory effort. Room air Cardiovascular: Regular rate and rhythm, no murmurs / rubs / gallops. No extremity edema. 2+ pedal pulses.   Abdomen: no tenderness, no masses palpated. Bowel  sounds positive.  PEG tube in place-abdominal binder available if needed Musculoskeletal: no clubbing / cyanosis. No joint deformity upper and lower extremities. no contractures. Normal muscle tone.  Skin: no rashes, lesions, ulcers. No induration Neurologic: CN 2-12 grossly intact although unable to accurately assess visual acuity given patient's limited ability to participate  (patient wore glasses prior to admission), attempted to verbally communicate with some distinct inappropriate words.  Continues to have some issues with impulsivity as evidenced by perseveration on certain words and some words are unintelligible. Sensation grossly intact, no evidence of extremity spasms on exam.  Moving all extremities spontaneously x4.  Patient left hand dominant and left side strength including shoulder shrug appears to be weaker at 3/5 with right side strength 4/5. Psychiatric: Awake and or engaging in conversation today.  Oriented to name and place at this time.  Smiles appropriately.   Assessment/Plan: V. fib arrest due to anterior MI: -s/p cardiac cath- aneurysm proximal LAD with dissection was culprit. -Continue aspirin, Lipitor,Coreg    Acute systolic congestive heart failure with EF 35%: 2/2 MI.   Daily weights with strict I's/O- has remained stable from a respiratory standpoint with persistent +3 L balance; unclear true dry weight noting at time of admission patient weighed 228 pounds with current weight 198 pounds Continue Coreg,Entresto Aldactone  and digoxin as above. Echocardiogram was completed 8/18 and anticipate will need to be repeated in November 3 month mark Last electrolyte panel was on 9/29-plan to repeat on 10/7  Prediabetes diabetes 2 in obese -HgbA1c 5.7 -With underlying systolic heart failure will initiate Farxiga 5 mg daily   Acute encephalopathy/acute delirium  2/2 anoxic brain injury:   -Had extensive work-up with EEG, MRI brain.   -Initially trach and PEG dependent.  But thus far has been decannulated -Oral intake remains inconsistent therefore PEG remains in place -Continues to have episodes of confusion, agitation as well as amnesia.  -Continue Trileptal as recommended by psychiatry. -Continue Vistaril for anxiety -Have written an order to allow family members to sit with patient overnight between the hours of 7 PM and 7 AM to aid in her  confusion and agitated and aggressive behaviors -Therapies recommend inpatient rehabilitation but due to patient's insurance and current rehab needs have been unable to locate appropriate facility  Dysphagia -SLP following and recommended to advance to full liquid diet 9/28. -Patient continues to require verbal cues to consistently keep her mouth open to except a bolus when being fed.  She is also been assisted with self-feeding by hand over hand methodology.  She continues to require significant encouragement to increase bolus volume and to open her mouth.  Physical deconditioning 2/2 anoxic brain injury -PT/OT documents slow but continued improvement during therapy sessions.  10/4 while working with PT initial stand did require moderate 2+ assist with progression and minimal assist after repeated trials and increase gait distance observed.  Nutrition Status: Nutrition Problem: Increased nutrient needs Etiology: post-op healing Signs/Symptoms: estimated needs Interventions: Tube feeding Estimated body mass index is 27.89 kg/m as calculated from the following:   Height as of this encounter: 5\' 10"  (1.778 m).   Weight as of this encounter: 88.2 kg.   Hypokalemia/hypomagnesemia:  -Magnesium slightly low at 1.8 on 9/29 -Started on magnesium 400 mg per tube daily on 10/5 -Pete labs on 10/7   Hypertension:  -BPs fairly stable in 90s to 100s.  -Entresto dose discontinued for suboptimal blood pressure readings   -Continue Coreg Aldactone and Entresto.   History of  SVT  -Maintaining sinus rhythm with beta-blocker described above   Diarrhea  -Resolved.   ?Aspiration pneumonia  -Has completed completed antibiotics     Data Reviewed: Basic Metabolic Panel: Recent Labs  Lab 11/05/19 0428  NA 140  K 4.0  CL 101  CO2 27  GLUCOSE 109*  BUN 10  CREATININE 0.71  CALCIUM 10.2  MG 1.8   Liver Function Tests: Recent Labs  Lab 11/05/19 0428  AST 32  ALT 54*  ALKPHOS 118   BILITOT 0.3  PROT 7.6  ALBUMIN 3.5   No results for input(s): LIPASE, AMYLASE in the last 168 hours. No results for input(s): AMMONIA in the last 168 hours. CBC: Recent Labs  Lab 11/05/19 0428  WBC 8.1  HGB 11.3*  HCT 36.3  MCV 84.8  PLT 523*   Cardiac Enzymes: No results for input(s): CKTOTAL, CKMB, CKMBINDEX, TROPONINI in the last 168 hours. BNP (last 3 results) Recent Labs    10/13/19 0440 10/14/19 0500 10/15/19 0552  BNP 186.1* 200.4* 168.9*    ProBNP (last 3 results) No results for input(s): PROBNP in the last 8760 hours.  CBG: Recent Labs  Lab 11/10/19 1736 11/10/19 2033 11/11/19 0054 11/11/19 0453 11/11/19 0831  GLUCAP 108* 86 122* 131* 148*    No results found for this or any previous visit (from the past 240 hour(s)).   Studies: No results found.  Scheduled Meds: . aspirin  81 mg Per Tube Daily  . atorvastatin  80 mg Per Tube Daily  . carvedilol  6.25 mg Per Tube BID WC  . dapagliflozin propanediol  5 mg Oral Daily  . digoxin  0.125 mg Per Tube Daily  . enoxaparin (LOVENOX) injection  40 mg Subcutaneous Q24H  . feeding supplement (OSMOLITE 1.5 CAL)  237 mL Per Tube 6 X Daily  . feeding supplement (PROSource TF)  45 mL Per Tube TID  . hydrOXYzine  10 mg Per Tube BID  . influenza vac split quadrivalent PF  0.5 mL Intramuscular Tomorrow-1000  . insulin aspart  0-15 Units Subcutaneous Q4H  . OXcarbazepine  75 mg Per Tube BID  . prenatal multivitamin  1 tablet Per Tube Q1200  . sacubitril-valsartan  1 tablet Per Tube BID  . spironolactone  25 mg Per Tube Daily   Continuous Infusions: . sodium chloride Stopped (11/06/19 1900)    Active Problems:   Cardiac arrest (HCC)   Acute combined systolic and diastolic heart failure (HCC)   Ventricular tachycardia, polymorphic (HCC)   Encounter for central line placement   Respiratory failure (HCC)   Acute metabolic encephalopathy   Anoxic brain injury [facility])   Dysphagia   Physical deconditioning    Acute delirium   Consultants:  Cardiology  PCCM  Interventional radiology  Neurology  CIR  Psychiatry  Procedures:  8/17 cardiac catheterization  8/18 EEG  8/18 echocardiogram 1. Septal and apical akinesis Distal anterior wall and mid/distal inferior wall hypokinesis Hyperdynamic basal function . Left ventricular ejection fraction, by estimation, is 30 to 35%. The left ventricle has moderately decreased function. The left ventricle demonstrates regional wall motion abnormalities (see scoring diagram/findings for description). The left ventricular internal cavity size was mildly dilated. Left ventricular diastolic parameters are indeterminate. 2. Right ventricular systolic function is normal. The right ventricular size is normal. 3. The mitral valve is normal in structure. Trivial mitral valve regurgitation. No evidence of mitral stenosis. 4. The aortic valve was not well visualized. Aortic valve regurgitation is not visualized. No aortic stenosis  is present. 5. The inferior vena cava is normal in size with greater  8/19 continuous EEG  8/22 EEG  8/31 EEG  8/31 gastrostomy tube placement per interventional radiology  Antibiotics: Anti-infectives (From admission, onward)   Start     Dose/Rate Route Frequency Ordered Stop   10/09/19 2100  vancomycin (VANCOREADY) IVPB 1500 mg/300 mL  Status:  Discontinued        1,500 mg 150 mL/hr over 120 Minutes Intravenous Every 12 hours 10/09/19 1237 10/10/19 1121   10/09/19 0800  ceFEPIme (MAXIPIME) 2 g in sodium chloride 0.9 % 100 mL IVPB        2 g 200 mL/hr over 30 Minutes Intravenous Every 8 hours 10/09/19 0752 10/15/19 2147   10/09/19 0800  vancomycin (VANCOREADY) IVPB 2000 mg/400 mL        2,000 mg 200 mL/hr over 120 Minutes Intravenous  Once 10/09/19 0752 10/09/19 1147   10/07/19 1540  ceFAZolin (ANCEF) 2-4 GM/100ML-% IVPB       Note to Pharmacy: Erie NoeSykes, Ashley   : cabinet override      10/07/19 1540 10/08/19 0344    10/07/19 1400  ceFAZolin (ANCEF) IVPB 2g/100 mL premix        2 g 200 mL/hr over 30 Minutes Intravenous To Radiology 10/06/19 1339 10/07/19 1627   09/30/19 0300  vancomycin (VANCOCIN) IVPB 1000 mg/200 mL premix  Status:  Discontinued        1,000 mg 200 mL/hr over 60 Minutes Intravenous Every 8 hours 09/29/19 1809 10/01/19 0853   09/29/19 1815  ceFEPIme (MAXIPIME) 2 g in sodium chloride 0.9 % 100 mL IVPB        2 g 200 mL/hr over 30 Minutes Intravenous Every 8 hours 09/29/19 1803 10/05/19 1801   09/29/19 1815  vancomycin (VANCOREADY) IVPB 2000 mg/400 mL        2,000 mg 200 mL/hr over 120 Minutes Intravenous  Once 09/29/19 1803 09/29/19 2137   09/24/19 2200  cefTRIAXone (ROCEPHIN) 2 g in sodium chloride 0.9 % 100 mL IVPB        2 g 200 mL/hr over 30 Minutes Intravenous Daily at bedtime 09/24/19 1201 09/28/19 2338   09/24/19 0330  cefTRIAXone (ROCEPHIN) 1 g in sodium chloride 0.9 % 100 mL IVPB  Status:  Discontinued        1 g 200 mL/hr over 30 Minutes Intravenous Daily at bedtime 09/24/19 0245 09/24/19 1201   09/24/19 0300  azithromycin (ZITHROMAX) 500 mg in sodium chloride 0.9 % 250 mL IVPB  Status:  Discontinued        500 mg 250 mL/hr over 60 Minutes Intravenous Daily at bedtime 09/24/19 0245 09/24/19 1103       Time spent: 20    Junious SilkAllison Desmen Schoffstall ANP  [facility] Pager [phone]. If 7PM-7AM, please contact night-coverage at [email or web address] 11/11/2019, 10:46 AM  LOS: 48 days

## 2019-11-11 NOTE — Progress Notes (Signed)
Mobility Specialist - Progress Note   11/11/19 1718  Mobility  Activity  (Cancel)    Unable to get assistance for chair follow and +2 support, will not see pt today.   Mamie Levers Mobility Specialist Mobility Specialist Phone: [phone]

## 2019-11-11 NOTE — TOC Progression Note (Signed)
Transition of Care (TOC) - Progression Note  Donn Pierini RN, BSN Transitions of Care Unit 4E- RN Case Manager See Treatment Team for direct phone #    Patient Details  Name: Beverly [name] MRN: [id number] Date of Birth: 12/21/1985  Transition of Care [facility]) CM/SW Contact  Zenda Alpers Lenn Sink, RN Phone Number: 11/11/2019, 1:54 PM  Clinical Narrative:    Have reached out to [facility] in [place]- have received return call from [facility]- per conversation they do accept Medicaid plans and she is willing to review clinicals on pt. Their main campus in [place] focuses on brain injuries.  Have faxed clinicals via epic to [facility] and will await to her from her.     Expected Discharge Plan: IP Rehab Facility Barriers to Discharge: Continued Medical Work up, SNF Pending Medicaid, SNF Pending bed offer  Expected Discharge Plan and Services Expected Discharge Plan: IP Rehab Facility In-house Referral: Clinical Social Work   Post Acute Care Choice: IP Rehab                                         Social Determinants of Health (SDOH) Interventions    Readmission Risk Interventions No flowsheet data found.

## 2019-11-12 ENCOUNTER — Inpatient Hospital Stay (HOSPITAL_COMMUNITY): Payer: Medicaid Other

## 2019-11-12 DIAGNOSIS — I469 Cardiac arrest, cause unspecified: Secondary | ICD-10-CM | POA: Diagnosis not present

## 2019-11-12 DIAGNOSIS — R1310 Dysphagia, unspecified: Secondary | ICD-10-CM | POA: Diagnosis not present

## 2019-11-12 DIAGNOSIS — R5381 Other malaise: Secondary | ICD-10-CM | POA: Diagnosis not present

## 2019-11-12 LAB — COMPREHENSIVE METABOLIC PANEL WITH GFR
ALT: 35 U/L (ref 0–44)
AST: 31 U/L (ref 15–41)
Albumin: 3.5 g/dL (ref 3.5–5.0)
Alkaline Phosphatase: 104 U/L (ref 38–126)
Anion gap: 12 (ref 5–15)
BUN: 13 mg/dL (ref 6–20)
CO2: 24 mmol/L (ref 22–32)
Calcium: 9.9 mg/dL (ref 8.9–10.3)
Chloride: 102 mmol/L (ref 98–111)
Creatinine, Ser: 0.56 mg/dL (ref 0.44–1.00)
GFR calc non Af Amer: >60 mL/min
Glucose, Bld: 100 mg/dL — ABNORMAL HIGH (ref 70–99)
Potassium: 4.4 mmol/L (ref 3.5–5.1)
Sodium: 138 mmol/L (ref 135–145)
Total Bilirubin: 0.2 mg/dL — ABNORMAL LOW (ref 0.3–1.2)
Total Protein: 7.9 g/dL (ref 6.5–8.1)

## 2019-11-12 LAB — CBC WITH DIFFERENTIAL/PLATELET
Abs Immature Granulocytes: 0.04 10*3/uL (ref 0.00–0.07)
Basophils Absolute: 0 10*3/uL (ref 0.0–0.1)
Basophils Relative: 0 %
Eosinophils Absolute: 0.1 10*3/uL (ref 0.0–0.5)
Eosinophils Relative: 1 %
HCT: 34.8 % — ABNORMAL LOW (ref 36.0–46.0)
Hemoglobin: 10.6 g/dL — ABNORMAL LOW (ref 12.0–15.0)
Immature Granulocytes: 1 %
Lymphocytes Relative: 37 %
Lymphs Abs: 3 10*3/uL (ref 0.7–4.0)
MCH: 25.4 pg — ABNORMAL LOW (ref 26.0–34.0)
MCHC: 30.5 g/dL (ref 30.0–36.0)
MCV: 83.5 fL (ref 80.0–100.0)
Monocytes Absolute: 0.6 10*3/uL (ref 0.1–1.0)
Monocytes Relative: 7 %
Neutro Abs: 4.4 10*3/uL (ref 1.7–7.7)
Neutrophils Relative %: 54 %
Platelets: 559 10*3/uL — ABNORMAL HIGH (ref 150–400)
RBC: 4.17 MIL/uL (ref 3.87–5.11)
RDW: 17.5 % — ABNORMAL HIGH (ref 11.5–15.5)
WBC: 8.2 10*3/uL (ref 4.0–10.5)
nRBC: 0 % (ref 0.0–0.2)

## 2019-11-12 LAB — GLUCOSE, CAPILLARY
Glucose-Capillary: 117 mg/dL — ABNORMAL HIGH (ref 70–99)
Glucose-Capillary: 103 mg/dL — ABNORMAL HIGH (ref 70–99)
Glucose-Capillary: 153 mg/dL — ABNORMAL HIGH (ref 70–99)
Glucose-Capillary: 129 mg/dL — ABNORMAL HIGH (ref 70–99)
Glucose-Capillary: 123 mg/dL — ABNORMAL HIGH (ref 70–99)
Glucose-Capillary: 111 mg/dL — ABNORMAL HIGH (ref 70–99)

## 2019-11-12 LAB — BLOOD GAS, VENOUS
Acid-Base Excess: 2.8 mmol/L — ABNORMAL HIGH (ref 0.0–2.0)
Bicarbonate: 26.9 mmol/L (ref 20.0–28.0)
Drawn by: 4636
FIO2: 21
O2 Saturation: 57.6 %
Patient temperature: 37
pCO2, Ven: 41.6 mmHg — ABNORMAL LOW (ref 44.0–60.0)
pH, Ven: 7.426 (ref 7.250–7.430)
pO2, Ven: 34.4 mmHg (ref 32.0–45.0)

## 2019-11-12 LAB — MAGNESIUM: Magnesium: 2 mg/dL (ref 1.7–2.4)

## 2019-11-12 MED ORDER — ENSURE ENLIVE PO LIQD
237.0000 mL | ORAL | Status: DC
  Administered 2019-11-12 – 2019-11-18 (×6): 237 mL via ORAL

## 2019-11-12 MED ORDER — OSMOLITE 1.5 CAL PO LIQD
237.0000 mL | Freq: Three times a day (TID) | ORAL | Status: DC
  Administered 2019-11-12 – 2019-11-18 (×18): 237 mL
  Filled 2019-11-12 (×20): qty 237

## 2019-11-12 NOTE — Progress Notes (Signed)
Mobility Specialist - Progress Note   11/12/19 1713  Mobility  Activity Ambulated in hall  Level of Assistance +2 (takes two people)  Assistive Device Other (Comment) (three musketeer hold, wheelchair to follow)  Distance Ambulated (ft) 40 ft  Mobility Response Tolerated fair  Mobility performed by Mobility specialist;Nurse  $Mobility charge 1 Mobility    Pre-mobility: 107 HR During mobility: 136 HR Post-mobility:105 HR  Pt required assistance for balance. She needed a seated rest break due to fatigue.   Mamie Levers Mobility Specialist Mobility Specialist Phone: [phone]

## 2019-11-12 NOTE — Progress Notes (Signed)
[facility] PROGRESS NOTE  Beverly [name] BLT:[id number] DOB: 11/01/1985 DOA: 09/24/2019 PCP: Patient, No Pcp Per  Status: Inpatient-Remains inpatient appropriate because:Altered mental status, Ongoing diagnostic testing needed not appropriate for outpatient work up, Unsafe d/c plan, IV treatments appropriate due to intensity of illness or inability to take PO and Inpatient level of care appropriate due to severity of illness   Dispo: The patient is from: Home              Anticipated d/c is to: SNF vs inpatient rehabilitation at outside facility              Anticipated d/c date is: > 3 days              Patient currently is not medically stable to d/c.  Continues to have episodes of acute delirium and agitation in the context of anoxic brain injury but as of 10/1 these episodes are improving after the addition of appropriate pharmacotherapy. PT and OT have seen patient and recommended inpatient rehabilitation vs SNF placement once otherwise medically stable.  Case has been reviewed by [facility] and they felt they could not provide the level of care the patient needed.  Other local inpatient rehab options are being explored.  Unfortunately [facility] is not "in network" for patient's insurance coverage.  Medicaid application is also pending.  10/5 [facility] in [place] reviewing clinicals but mother apprehensive about sending patient this far away noting he would be unable to visit patient daily given other responsibilities in the [place] area.  Unfortunately this would be an excellent facility for the patient since the main campus in [place] focuses on brain injuries.  **If unable to locate an inpatient rehabilitation facility need to consider discharge to a SNF with focus on management of brain injury**  Code Status: Full Family Communication: Mother at bedside 10/6 DVT prophylaxis: Lovenox Vaccination status: Has not received Covid vaccine  HPI: 34 year old female presents to  the hospital 2 weeks postpartum with chest pain and in the ED sustained V. fib arrest with V. fib x3 epi x1, EKG showed ST elevation underwent Cath Lab at [facility] showed aneurysmal dilation(dissection?) of the LAD was identified. She was transferred to [facility] for ICU admission. LVEF 30% EF in cath lab.    Significant events Echo 8/18> LVEF 30-35%, septal and apical akinesis distal anterior wall an d mid/distal inferior wall hypokinesis hyperdynamic basal function. LV size mildly dilated.  Patient suffered anoxic brain injury. 8.23:MRI brain  8/30 underwent tracheostomy 8/30 PEG tube placement 9/10: Continues to be encephalopathic, able to open eyes and track but not following any commands and not talking,is moving extremities independently 9/15: Tracheostomy downsized to # 4 cufless. 9/20:Trach CAP trial, PCCM following SNF planned for 9/30- 30 days out from trach 9/21:Patient is showing improvement with the speech therapy and PT-recommendation changed from SNF to inpatient rehab.  9/25: Trach removed.  Subjective: Patient awake and remains verbally interactive.  Still having episodes of lack of speech fluency and remains disoriented to year, time of day and current president.  Mother at side of bed and discharge planning and expectations discussed.  If patient unable to discharge to an inpatient rehabilitation unit family is interested in discharging to a skilled nursing facility if they are allowed daily contact with the patient.  Otherwise they will investigate whether they can take patient home.  Objective: Vitals:   11/12/19 0837 11/12/19 1134  BP: (!) 105/55 106/62  Pulse: 91 88  Resp: 18   Temp: (!) 97.5 F (36.4 C) 98.4 F (36.9 C)  SpO2: 97% 98%    Intake/Output Summary (Last 24 hours) at 11/12/2019 1204 Last data filed at 11/12/2019 0800 Gross per 24 hour  Intake 1132 ml  Output --  Net 1132 ml   Filed Weights   11/08/19 0441 11/11/19 0458 11/12/19 0402   Weight: 88.7 kg 88.2 kg 88.4 kg    Exam:  Constitutional: NAD, calm, comfortable  Respiratory: clear to auscultation bilaterally. Normal respiratory effort. Room air Cardiovascular: Regular rate and rhythm, no murmurs / rubs / gallops. No extremity edema. 2+ pedal pulses.   Abdomen: no tenderness, no masses palpated. Bowel sounds positive.  PEG tube in place Musculoskeletal: no clubbing / cyanosis. No contractures. Normal muscle tone.  Skin: no rashes, lesions, ulcers. No induration Neurologic: CN 2-12 grossly intact, speech patterns more coherent although still has some disfluency and evidence of perseveration sensation grossly intact, no evidence of extremity spasms on exam.  Moving all extremities spontaneously x4.  Patient left hand dominant and left side strength including shoulder shrug appears to be weaker at 3/5 with right side strength 4/5. Psychiatric: Awake and or engaging in conversation today.  Oriented to name and place at this time.  Smiles appropriately.   Assessment/Plan: V. fib arrest due to anterior MI: -s/p cardiac cath- aneurysm proximal LAD with dissection was culprit. -Continue aspirin, Lipitor,Coreg    Acute systolic congestive heart failure with EF 35%: 2/2 MI.   Daily weights with strict I's/O- has remained stable from a respiratory standpoint with persistent +3 L balance; unclear true dry weight noting at time of admission patient weighed 228 pounds with current weight 198 pounds Continue Coreg,Entresto Aldactone  and digoxin as above. Echocardiogram was completed 8/18 and anticipate will need to be repeated in November 3 month mark Last electrolyte panel was on 9/29-plan to repeat on 10/7  Prediabetes diabetes 2 in obese -HgbA1c 5.7 -With underlying systolic heart failure will initiate Farxiga 5 mg daily   Acute encephalopathy/acute delirium  2/2 anoxic brain injury:   -Had extensive work-up with EEG, MRI brain.   -Initially trach and PEG dependent.  But  thus far has been decannulated -Oral intake remains inconsistent therefore PEG remains in place -Continues to have episodes of confusion, agitation as well as amnesia.  -Continue Trileptal as recommended by psychiatry. -Continue Vistaril for anxiety -Have written an order to allow family members to sit with patient overnight between the hours of 7 PM and 7 AM to aid in her confusion and agitated and aggressive behaviors -Therapies recommend inpatient rehabilitation but due to patient's insurance and current rehab needs have been unable to locate appropriate facility; family interested in skilled nursing facility prior to patient discharging home if no significant visitation restrictions.  In addition discussed with mother discharge planning and long-term expectations regarding slow progress and that mobility is only one issue that needs to be addressed.  Over the long-term concerns over safety and impulsivity related to brain injury that would require a minimum of 1 year 24/7 care and could be a lifelong issue.  Dysphagia -SLP following and patient currently on dysphagia 3 diet but with inconsistent oral intake so recommendation is to continue tube feeding-May need to consider cutting tube feeding volume in order to promote increased appetite  Physical deconditioning 2/2 anoxic brain injury -PT/OT documents slow but continued improvement during therapy sessions.  10/4 while working with PT initial stand did require moderate 2+ assist with progression  and minimal assist after repeated trials and increase gait distance observed.  Nutrition Status: Nutrition Problem: Increased nutrient needs Etiology: post-op healing Signs/Symptoms: estimated needs Interventions: Tube feeding Estimated body mass index is 27.97 kg/m as calculated from the following:   Height as of this encounter: 5\' 10"  (1.778 m).   Weight as of this encounter: 88.4 kg.   Hypokalemia/hypomagnesemia:  -Magnesium slightly low at  1.8 on 9/29 -Started on magnesium 400 mg per tube daily on 10/5 -Pete labs on 10/7   Hypertension:  -BPs fairly stable in 90s to 100s.  -Entresto dose discontinued for suboptimal blood pressure readings   -Continue Coreg Aldactone and Entresto.   History of SVT  -Maintaining sinus rhythm with beta-blocker described above   Diarrhea  -Resolved.   ?Aspiration pneumonia  -Has completed completed antibiotics     Data Reviewed: Basic Metabolic Panel: No results for input(s): NA, K, CL, CO2, GLUCOSE, BUN, CREATININE, CALCIUM, MG, PHOS in the last 168 hours. Liver Function Tests: No results for input(s): AST, ALT, ALKPHOS, BILITOT, PROT, ALBUMIN in the last 168 hours. No results for input(s): LIPASE, AMYLASE in the last 168 hours. No results for input(s): AMMONIA in the last 168 hours. CBC: No results for input(s): WBC, NEUTROABS, HGB, HCT, MCV, PLT in the last 168 hours. Cardiac Enzymes: No results for input(s): CKTOTAL, CKMB, CKMBINDEX, TROPONINI in the last 168 hours. BNP (last 3 results) Recent Labs    10/13/19 0440 10/14/19 0500 10/15/19 0552  BNP 186.1* 200.4* 168.9*    ProBNP (last 3 results) No results for input(s): PROBNP in the last 8760 hours.  CBG: Recent Labs  Lab 11/11/19 2003 11/12/19 0010 11/12/19 0409 11/12/19 0751 11/12/19 1134  GLUCAP 155* 117* 103* 153* 129*    No results found for this or any previous visit (from the past 240 hour(s)).   Studies: No results found.  Scheduled Meds: . aspirin  81 mg Per Tube Daily  . atorvastatin  80 mg Per Tube Daily  . carvedilol  6.25 mg Per Tube BID WC  . dapagliflozin propanediol  5 mg Oral Daily  . digoxin  0.125 mg Per Tube Daily  . enoxaparin (LOVENOX) injection  40 mg Subcutaneous Q24H  . feeding supplement (OSMOLITE 1.5 CAL)  237 mL Per Tube 6 X Daily  . feeding supplement (PROSource TF)  45 mL Per Tube TID  . hydrOXYzine  10 mg Per Tube BID  . influenza vac split quadrivalent PF  0.5 mL  Intramuscular Tomorrow-1000  . insulin aspart  0-15 Units Subcutaneous Q4H  . OXcarbazepine  75 mg Per Tube BID  . prenatal multivitamin  1 tablet Per Tube Q1200  . sacubitril-valsartan  1 tablet Per Tube BID  . spironolactone  25 mg Per Tube Daily   Continuous Infusions: . sodium chloride Stopped (11/06/19 1900)    Active Problems:   Cardiac arrest (HCC)   Acute combined systolic and diastolic heart failure (HCC)   Ventricular tachycardia, polymorphic (HCC)   Encounter for central line placement   Respiratory failure (HCC)   Acute metabolic encephalopathy   Anoxic brain injury [facility])   Dysphagia   Physical deconditioning   Acute delirium   Consultants:  Cardiology  PCCM  Interventional radiology  Neurology  CIR  Psychiatry  Procedures:  8/17 cardiac catheterization  8/18 EEG  8/18 echocardiogram 1. Septal and apical akinesis Distal anterior wall and mid/distal inferior wall hypokinesis Hyperdynamic basal function . Left ventricular ejection fraction, by estimation, is 30 to 35%. The  left ventricle has moderately decreased function. The left ventricle demonstrates regional wall motion abnormalities (see scoring diagram/findings for description). The left ventricular internal cavity size was mildly dilated. Left ventricular diastolic parameters are indeterminate. 2. Right ventricular systolic function is normal. The right ventricular size is normal. 3. The mitral valve is normal in structure. Trivial mitral valve regurgitation. No evidence of mitral stenosis. 4. The aortic valve was not well visualized. Aortic valve regurgitation is not visualized. No aortic stenosis is present. 5. The inferior vena cava is normal in size with greater  8/19 continuous EEG  8/22 EEG  8/31 EEG  8/31 gastrostomy tube placement per interventional radiology  Antibiotics: Anti-infectives (From admission, onward)   Start     Dose/Rate Route Frequency Ordered Stop   10/09/19 2100   vancomycin (VANCOREADY) IVPB 1500 mg/300 mL  Status:  Discontinued        1,500 mg 150 mL/hr over 120 Minutes Intravenous Every 12 hours 10/09/19 1237 10/10/19 1121   10/09/19 0800  ceFEPIme (MAXIPIME) 2 g in sodium chloride 0.9 % 100 mL IVPB        2 g 200 mL/hr over 30 Minutes Intravenous Every 8 hours 10/09/19 0752 10/15/19 2147   10/09/19 0800  vancomycin (VANCOREADY) IVPB 2000 mg/400 mL        2,000 mg 200 mL/hr over 120 Minutes Intravenous  Once 10/09/19 0752 10/09/19 1147   10/07/19 1540  ceFAZolin (ANCEF) 2-4 GM/100ML-% IVPB       Note to Pharmacy: Erie NoeSykes, Ashley   : cabinet override      10/07/19 1540 10/08/19 0344   10/07/19 1400  ceFAZolin (ANCEF) IVPB 2g/100 mL premix        2 g 200 mL/hr over 30 Minutes Intravenous To Radiology 10/06/19 1339 10/07/19 1627   09/30/19 0300  vancomycin (VANCOCIN) IVPB 1000 mg/200 mL premix  Status:  Discontinued        1,000 mg 200 mL/hr over 60 Minutes Intravenous Every 8 hours 09/29/19 1809 10/01/19 0853   09/29/19 1815  ceFEPIme (MAXIPIME) 2 g in sodium chloride 0.9 % 100 mL IVPB        2 g 200 mL/hr over 30 Minutes Intravenous Every 8 hours 09/29/19 1803 10/05/19 1801   09/29/19 1815  vancomycin (VANCOREADY) IVPB 2000 mg/400 mL        2,000 mg 200 mL/hr over 120 Minutes Intravenous  Once 09/29/19 1803 09/29/19 2137   09/24/19 2200  cefTRIAXone (ROCEPHIN) 2 g in sodium chloride 0.9 % 100 mL IVPB        2 g 200 mL/hr over 30 Minutes Intravenous Daily at bedtime 09/24/19 1201 09/28/19 2338   09/24/19 0330  cefTRIAXone (ROCEPHIN) 1 g in sodium chloride 0.9 % 100 mL IVPB  Status:  Discontinued        1 g 200 mL/hr over 30 Minutes Intravenous Daily at bedtime 09/24/19 0245 09/24/19 1201   09/24/19 0300  azithromycin (ZITHROMAX) 500 mg in sodium chloride 0.9 % 250 mL IVPB  Status:  Discontinued        500 mg 250 mL/hr over 60 Minutes Intravenous Daily at bedtime 09/24/19 0245 09/24/19 1103       Time spent: 25    Junious SilkAllison Betha Shadix  ANP  [facility] Pager [phone]. If 7PM-7AM, please contact night-coverage at [email or web address] 11/12/2019, 12:04 PM  LOS: 49 days

## 2019-11-12 NOTE — Progress Notes (Signed)
  Speech Language Pathology Treatment: Dysphagia  Patient Details Name: Beverly [name] MRN: [id number] DOB: 19-Apr-1985 Today's Date: 11/12/2019 Time: 5621-3086 SLP Time Calculation (min) (ACUTE ONLY): 35 min  Assessment / Plan / Recommendation Clinical Impression  Patient today awake with decreased sustained attention to task of po intake.  RN reports pt will only accept a few bites/sips and then no longer wants more food.   Question if pt would eat more if diet was advanced.    Today pt willing to consume po - no family present but nursing in room.  SLP obtained graham cracker, pudding and gingerale for pt.  Intake provided using hand over hand assist to improve proprioception/sensorimotor input.  Prolonged mastication continues with inconsisent oral holding and pocketing.  Pt tries to put more food in her mouth before oral clearance and needs total cues to clear solids before taking more. Use of liquids facilitated oral transiting very effectively.    NO indication of aspiration and pt's largest factor for dysphagia is her mentation/cognitive status.  As RN reports intake is poor, recommend to advance diet with very strict precautions/monitoring.  Pt will have to use liquids to transit solids.    Would advise also to consider decreasing tube feeding to improve intake.  Will follow up for diet tolerance, use of compensation strategies. Nurses present in the room and observed session with SLP/pt usign compensation strategies.    HPI HPI: Pt is a 34 y.o. female with PMH of HTN who was admitted 09/23/19 2 weeks post partum after emergent C section due to rupture of membranes with VF arrest. Pt with enterobacter PNA and encephalopathy, trach placed 8/30 and PEG on 8/31. MRI 8/31: No evidence of anoxic brain injury. Pt changed to #4 cuffless trach on 9/15; capped on 9/21 and decannulated on 9/25.      SLP Plan  Continue with current plan of care       Recommendations  Diet recommendations:  Dysphagia 3 (mechanical soft);Thin liquid Liquids provided via: Cup;Teaspoon;Straw Medication Administration: Via alternative means Supervision: Staff to assist with self feeding;Full supervision/cueing for compensatory strategies Compensations: Slow rate;Small sips/bites;Minimize environmental distractions;Follow solids with liquid Postural Changes and/or Swallow Maneuvers: Seated upright 90 degrees                Oral Care Recommendations: Oral care BID;Staff/trained caregiver to provide oral care Follow up Recommendations: Inpatient Rehab SLP Visit Diagnosis: Dysphagia, oral phase (R13.11) Plan: Continue with current plan of care       GO                Chales Abrahams 11/12/2019, 9:43 AM   Rolena Infante, MS [facility] SLP Acute Rehab Services Office [phone]

## 2019-11-12 NOTE — Progress Notes (Signed)
Physical Therapy Treatment Patient Details Name: Beverly [name] MRN: [id number] DOB: 1986-01-24 Today's Date: 11/12/2019    History of Present Illness Pt is a 34 y.o. female admitted 09/23/19 with intermittent chest discomfort; of note, pt 2-weeks post-partum (had emergent C-section); while in ED, pt with VF cardiac arrest requiring intubation. Brain MRI 8/23 with no acute abnormality. S/p trach and PEG tube 8/30. Course complicated by encephalopathy, anoxic brain injury, enterobacterial PNA. Transferred out of ICU On 9/4. PMH includes HTN.    PT Comments    Pt tolerates treatment well, ambulating for increased distance overall this session. Pt continues to demonstrate significant spasticity in all extremities with poor coordination and ataxic gait which place her at a high falls risk. Pt also demonstrates poor insight into her current deficits and is impulsive which exacerbate her falls risk. Pt will continue to benefit from PT POC to aide in improving balance and coordination of movement in an effort to prevent falls. PT continues to recommend CIR as the pt was independent prior to admission and demonstrates the potential to make significant functional gains with high intensity inpatient PT services.   Follow Up Recommendations  CIR;Supervision/Assistance - 24 hour     Equipment Recommendations   (TBD)    Recommendations for Other Services       Precautions / Restrictions Precautions Precautions: Fall Precaution Comments: incontinence Restrictions Weight Bearing Restrictions: No    Mobility  Bed Mobility Overal bed mobility: Needs Assistance Bed Mobility: Supine to Sit;Sit to Supine     Supine to sit: Min assist Sit to supine: Mod assist   General bed mobility comments: pt requires assistance for safety with all bed mobility due to spasticity and extensor tone increased risk of sliding off bed  Transfers Overall transfer level: Needs assistance Equipment used: 2 person  hand held assist Transfers: Sit to/from [employer] Transfers Sit to Stand: Mod assist;+2 physical assistance Stand pivot transfers: Mod assist;+2 physical assistance       General transfer comment: pt with strong posterior lean initially after all standing attempts, poor control of UEs to bear weight, PT attempts to encourage anterior lean  Ambulation/Gait Ambulation/Gait assistance: Mod assist;+2 physical assistance Gait Distance (Feet): 40 Feet (additional trial of 25') Assistive device: 2 person hand held assist (and chair follow) Gait Pattern/deviations: Step-to pattern;Decreased weight shift to left;Ataxic;Leaning posteriorly Gait velocity: reduced Gait velocity interpretation: <1.8 ft/sec, indicate of risk for recurrent falls General Gait Details: pt with tendency to lean posteriorly and to right side, impaired control of step pattern with ataxic steps, pt with increased sway and assistance requirements when distracted. Pt remains unable to grip and control UEs enough to utilize support of RW safely   Stairs             Wheelchair Mobility    Modified Rankin (Stroke Patients Only)       Balance Overall balance assessment: Needs assistance Sitting-balance support: No upper extremity supported;Feet supported Sitting balance-Leahy Scale: Poor Sitting balance - Comments: min-modA, considerable variability in posture, intermittent extensor tone, pt impulsively attempts to stand multiple times during session Postural control: Posterior lean Standing balance support: Bilateral upper extremity supported Standing balance-Leahy Scale: Poor Standing balance comment: min-modA x2 due to posterior lean and unpredictable postural sway                            Cognition Arousal/Alertness: Awake/alert Behavior During Therapy: Impulsive Overall Cognitive Status: Impaired/Different from baseline Area  of Impairment: Orientation;Attention;Memory;Following  commands;Safety/judgement;Awareness;Problem solving               Rancho Levels of Cognitive Functioning Rancho Los Amigos Scales of Cognitive Functioning: Confused/inappropriate/non-agitated Orientation Level: Disoriented to;Place;Time;Situation Current Attention Level: Focused Memory: Decreased recall of precautions;Decreased short-term memory Following Commands: Follows one step commands with increased time;Follows multi-step commands inconsistently Safety/Judgement: Decreased awareness of safety;Decreased awareness of deficits Awareness: Intellectual Problem Solving: Slow processing;Difficulty sequencing;Requires verbal cues;Requires tactile cues        Exercises      General Comments General comments (skin integrity, edema, etc.): VSS on RA      Pertinent Vitals/Pain Pain Assessment: No/denies pain    Home Living                      Prior Function            PT Goals (current goals can now be found in the care plan section) Acute Rehab PT Goals Patient Stated Goal: to improve mobility quality PT Goal Formulation: With patient/family Time For Goal Achievement: 11/26/19 Potential to Achieve Goals: Fair Progress towards PT goals: Progressing toward goals    Frequency    Min 3X/week      PT Plan Current plan remains appropriate    Co-evaluation              AM-PAC PT "6 Clicks" Mobility   Outcome Measure  Help needed turning from your back to your side while in a flat bed without using bedrails?: A Lot Help needed moving from lying on your back to sitting on the side of a flat bed without using bedrails?: A Lot Help needed moving to and from a bed to a chair (including a wheelchair)?: A Lot Help needed standing up from a chair using your arms (e.g., wheelchair or bedside chair)?: A Lot Help needed to walk in hospital room?: A Lot Help needed climbing 3-5 steps with a railing? : Total 6 Click Score: 11    End of Session Equipment  Utilized During Treatment: Gait belt Activity Tolerance: Patient tolerated treatment well Patient left: in bed;with call bell/phone within reach;with bed alarm set;with family/visitor present Nurse Communication: Mobility status PT Visit Diagnosis: Other abnormalities of gait and mobility (R26.89);Difficulty in walking, not elsewhere classified (R26.2);Other symptoms and signs involving the nervous system (R29.898)     Time: 4235-3614 PT Time Calculation (min) (ACUTE ONLY): 25 min  Charges:  $Gait Training: 8-22 mins $Therapeutic Activity: 8-22 mins                     Arlyss Gandy, PT, DPT Acute Rehabilitation Pager: [phone]    Arlyss Gandy 11/12/2019, 3:08 PM

## 2019-11-12 NOTE — Progress Notes (Signed)
I was notified by staff of pt with possible neuro change and requested second assessment. Upon arrival, pt had returned to baseline and was selectively answering questions and making statements such as "they talk too much". Fiance at bedside.

## 2019-11-12 NOTE — Progress Notes (Addendum)
Nutrition Follow-up  DOCUMENTATION CODES:   Not applicable  INTERVENTION:   Advise against discontinuation of tube feeding until pt demonstrates consistent PO intake.   Try Ensure Enlive po daily, each supplement provides 350 kcal and 20 grams of protein  Decrease bolus feedings: -1 carton/ARC of Osmolite 1.5 three times daily  -45 ml ProSource TID  Provides: 1185 kcals, 78 grams protein, 543 ml free water. Meets 55% kcal and 68% protein needs.   NUTRITION DIAGNOSIS:   Increased nutrient needs related to post-op healing as evidenced by estimated needs.  Ongoing  GOAL:   Patient will meet greater than or equal to 90% of their needs  Addressed via TF  MONITOR:   Vent status, Skin, TF tolerance, I & O's, Labs, Weight trends  REASON FOR ASSESSMENT:   Ventilator    ASSESSMENT:   Patient with PMH significant for HTN and 2 weeks post partum emergency C-section. Presents this admission with new onset peripartum cardiomyopathy with VF arrest.   8/30- trach, PEG 9/4- on ATC 9/20- trach downsized, capped 9/25- trach removed   Diet advanced to dysphagia 3 with thin liquids today! Per mother, pt consumed grits and ice cream without difficulty for breakfast. Last 5 meal completions charted as 50%, 25%, 25%, 5%, 50%. Will trial decrease of bolus feedings to promote appetite. Mother to encourage pt to drink one Ensure daily. Advise against discontinuation of tube feeding until pt demonstrates consistent PO intake.   Admission weight: 103.1 kg  Current weight: 88.4 kg (stable over last week)  Medications: SS novolog, prenatal MVI, aldactone Labs: CBG 72-155  Diet Order:   Diet Order            Diet full liquid Room service appropriate? No; Fluid consistency: Thin  Diet effective now                 EDUCATION NEEDS:   Not appropriate for education at this time  Skin:  Skin Assessment: Skin Integrity Issues: Skin Integrity Issues:: Incisions Incisions:  neck  Last BM:  10/6  Height:   Ht Readings from Last 1 Encounters:  09/24/19 5\' 10"  (1.778 m)    Weight:   Wt Readings from Last 1 Encounters:  11/12/19 88.4 kg    BMI:  Body mass index is 27.97 kg/m.  Estimated Nutritional Needs:   Kcal:  2150-2350 kcal  Protein:  115-130 grams  Fluid:  >/= 2 L/day   01/12/20 RD, LDN Clinical Nutrition Pager listed in AMION

## 2019-11-12 NOTE — Progress Notes (Signed)
Upon change of shift, pt appeared was in bed with a fixed stare.  Not responding to verbal commands, will not smile, will not follow fingers or aware of staff.  Pt was unable to talk and interact with staff, no response to pain stimuli.  Her last known well time was 1815, pt was trying to get out of bed and was talking with staff.  Fiance came to bedside and unable to get patient to talk with him or react to him.  Patient has a fixed gazed that seems to be different from her normal baseline.  Obtained a blood sugar of 111, VSS, 100% on RA, EKG completed.  Orders received. Will continue to monitor.

## 2019-11-13 ENCOUNTER — Inpatient Hospital Stay (HOSPITAL_COMMUNITY): Payer: Medicaid Other

## 2019-11-13 DIAGNOSIS — G9341 Metabolic encephalopathy: Secondary | ICD-10-CM | POA: Diagnosis not present

## 2019-11-13 DIAGNOSIS — R1310 Dysphagia, unspecified: Secondary | ICD-10-CM | POA: Diagnosis not present

## 2019-11-13 DIAGNOSIS — I469 Cardiac arrest, cause unspecified: Secondary | ICD-10-CM | POA: Diagnosis not present

## 2019-11-13 DIAGNOSIS — R5381 Other malaise: Secondary | ICD-10-CM | POA: Diagnosis not present

## 2019-11-13 LAB — GLUCOSE, CAPILLARY
Glucose-Capillary: 104 mg/dL — ABNORMAL HIGH (ref 70–99)
Glucose-Capillary: 106 mg/dL — ABNORMAL HIGH (ref 70–99)
Glucose-Capillary: 126 mg/dL — ABNORMAL HIGH (ref 70–99)
Glucose-Capillary: 132 mg/dL — ABNORMAL HIGH (ref 70–99)
Glucose-Capillary: 146 mg/dL — ABNORMAL HIGH (ref 70–99)
Glucose-Capillary: 151 mg/dL — ABNORMAL HIGH (ref 70–99)
Glucose-Capillary: 97 mg/dL (ref 70–99)

## 2019-11-13 LAB — BASIC METABOLIC PANEL
Anion gap: 11 (ref 5–15)
BUN: 13 mg/dL (ref 6–20)
CO2: 24 mmol/L (ref 22–32)
Calcium: 9.6 mg/dL (ref 8.9–10.3)
Chloride: 103 mmol/L (ref 98–111)
Creatinine, Ser: 0.54 mg/dL (ref 0.44–1.00)
GFR calc non Af Amer: >60 mL/min (ref >60)
Glucose, Bld: 103 mg/dL — ABNORMAL HIGH (ref 70–99)
Potassium: 4.1 mmol/L (ref 3.5–5.1)
Sodium: 138 mmol/L (ref 135–145)

## 2019-11-13 MED ORDER — METAXALONE 800 MG PO TABS
400.0000 mg | ORAL_TABLET | Freq: Three times a day (TID) | ORAL | Status: DC
  Administered 2019-11-13 – 2019-11-24 (×30): 400 mg via ORAL
  Filled 2019-11-13 (×17): qty 0.5
  Filled 2019-11-13: qty 1
  Filled 2019-11-13 (×20): qty 0.5

## 2019-11-13 NOTE — Progress Notes (Addendum)
[facility] PROGRESS NOTE  Beverly [name] ZOX:[id number] DOB: July 22, 1985 DOA: 09/24/2019 PCP: Patient, No Pcp Per  Status: Inpatient-Remains inpatient appropriate because:Altered mental status, Ongoing diagnostic testing needed not appropriate for outpatient work up, Unsafe d/c plan, IV treatments appropriate due to intensity of illness or inability to take PO and Inpatient level of care appropriate due to severity of illness   Dispo: The patient is from: Home              Anticipated d/c is to: SNF vs inpatient rehabilitation at outside facility              Anticipated d/c date is: > 3 days              Patient currently is not medically stable to d/c.  Continues to have episodes of acute delirium and agitation in the context of anoxic brain injury but as of 10/1 these episodes are improving after the addition of appropriate pharmacotherapy. PT and OT have seen patient and recommended inpatient rehabilitation vs SNF placement once otherwise medically stable.  Case has been reviewed by [facility] and they felt they could not provide the level of care the patient needed.  Other local inpatient rehab options are being explored.  Unfortunately [facility] is not "in network" for patient's insurance coverage.  Medicaid application is also pending.  10/5 [facility] in [place] reviewing clinicals but mother apprehensive about sending patient this far away noting he would be unable to visit patient daily given other responsibilities in the [place] area.  Unfortunately this would be an excellent facility for the patient since the main campus in [place] focuses on brain injuries.  **If unable to locate an inpatient rehabilitation facility need to consider discharge to a SNF with focus on management of brain injury**  Code Status: Full Family Communication: Mother at bedside 10/7 DVT prophylaxis: Lovenox Vaccination status: Has not received Covid vaccine  HPI: 34 year old female presents to  the hospital 2 weeks postpartum with chest pain and in the ED sustained V. fib arrest with V. fib x3 epi x1, EKG showed ST elevation underwent Cath Lab at [facility] showed aneurysmal dilation(dissection?) of the LAD was identified. She was transferred to [facility] for ICU admission. LVEF 30% EF in cath lab.    Significant events Echo 8/18> LVEF 30-35%, septal and apical akinesis distal anterior wall an d mid/distal inferior wall hypokinesis hyperdynamic basal function. LV size mildly dilated.  Patient suffered anoxic brain injury. 8.23:MRI brain  8/30 underwent tracheostomy 8/30 PEG tube placement 9/10: Continues to be encephalopathic, able to open eyes and track but not following any commands and not talking,is moving extremities independently 9/15: Tracheostomy downsized to # 4 cufless. 9/20:Trach CAP trial, PCCM following SNF planned for 9/30- 30 days out from trach 9/21:Patient is showing improvement with the speech therapy and PT-recommendation changed from SNF to inpatient rehab.  9/25: Trach removed.  Subjective: Awake and alert today.  At baseline mentation.  Mother at bedside.  Some concerns overnight that patient may been having seizure activity based on staring behavior.  Patient and mother confirmed that she was actually irritated with her boyfriend and did not want to speak with him any further so she was basically ignoring him as well as other people that came in the room.  Informed both that as a precaution EEG previously ordered will be obtained to ensure no issues.  Objective: Vitals:   11/13/19 0812 11/13/19 0851  BP: 122/72 123/84  Pulse:  94 79  Resp:  18  Temp: (!) 97.5 F (36.4 C) 98.8 F (37.1 C)  SpO2: 96% 98%    Intake/Output Summary (Last 24 hours) at 11/13/2019 1058 Last data filed at 11/12/2019 1607 Gross per 24 hour  Intake 240 ml  Output --  Net 240 ml   Filed Weights   11/11/19 0458 11/12/19 0402 11/13/19 0438  Weight: 88.2 kg 88.4 kg 89.6 kg     Exam:  Constitutional: NAD, calm, comfortable  Respiratory: clear to auscultation bilaterally. Normal respiratory effort. Room air Cardiovascular: Regular rate and rhythm, no murmurs / rubs / gallops. No extremity edema. 2+ pedal pulses.   Abdomen: no tenderness, no masses palpated. Bowel sounds positive.  PEG tube in place Musculoskeletal: no clubbing / cyanosis. No contractures. Normal muscle tone.  Skin: no rashes, lesions, ulcers. No induration Neurologic: CN 2-12 grossly intact, speech patterns more coherent although still has some disfluency and evidence of perseveration sensation grossly intact, no evidence of extremity spasms on exam.  Moving all extremities spontaneously x4.  Patient left hand dominant and left side strength including shoulder shrug appears to be weaker at 3/5 with right side strength 4/5. Psychiatric: Awake and or engaging in conversation today.  Oriented to name and place at this time.  Smiles appropriately.   Assessment/Plan: V. fib arrest due to anterior MI: -s/p cardiac cath- aneurysm proximal LAD with dissection was culprit. -Continue aspirin, Lipitor,Coreg    Acute systolic congestive heart failure with EF 35%: 2/2 MI.   Daily weights with strict I's/O- has remained stable from a respiratory standpoint with persistent +3 L balance; unclear true dry weight noting at time of admission patient weighed 228 pounds with current weight 198 pounds Continue Coreg,Entresto Aldactone  and digoxin as above. Echocardiogram was completed 8/18 and anticipate will need to be repeated in November 3 month mark Last electrolyte panel was on 9/29-plan to repeat on 10/7  Prediabetes diabetes 2 in obese -HgbA1c 5.7 -With underlying systolic heart failure will initiate Farxiga 5 mg daily   Acute encephalopathy/acute delirium  2/2 anoxic brain injury:   -Had extensive work-up with EEG, MRI brain.   -Initially trach and PEG dependent.  But thus far has been  decannulated -Oral intake remains inconsistent therefore PEG remains in place -Continues to have episodes of confusion, agitation as well as amnesia.  -Continue Trileptal as recommended by psychiatry. -Continue Vistaril for anxiety -Have written an order to allow family members to sit with patient overnight between the hours of 7 PM and 7 AM to aid in her confusion and agitated and aggressive behaviors -Therapies recommend inpatient rehabilitation but due to patient's insurance and current rehab needs have been unable to locate appropriate facility; family interested in skilled nursing facility prior to patient discharging home if no significant visitation restrictions.  In addition discussed with mother discharge planning and long-term expectations regarding slow progress and that mobility is only one issue that needs to be addressed.  Over the long-term concerns over safety and impulsivity related to brain injury that would require a minimum of 1 year 24/7 care and could be a lifelong issue. -10/6 into the early hours of 10/7 there were some concerns patient may be experiencing seizure activity.  Was examined by covering MD overnight and EEG has been ordered.  Mother and patient both explain this as patient being irritated with her boyfriend who is at the bedside and she was simply ignoring him. -Also documented with significant spasticity during PT and OT sessions-Skelaxin  400 mg TID initiated on 10/7-we will not use baclofen at this juncture given CNS effects  Dysphagia -SLP following and patient currently on dysphagia 3 diet but with inconsistent oral intake so recommendation is to continue tube feeding-May need to consider cutting tube feeding volume in order to promote increased appetite  Physical deconditioning 2/2 anoxic brain injury -PT/OT documents slow but continued improvement during therapy sessions.  10/4 while working with PT initial stand did require moderate 2+ assist with progression  and minimal assist after repeated trials and increase gait distance observed.  Nutrition Status: Nutrition Problem: Increased nutrient needs Etiology: post-op healing Signs/Symptoms: estimated needs Interventions: Tube feeding Estimated body mass index is 28.34 kg/m as calculated from the following:   Height as of this encounter: 5\' 10"  (1.778 m).   Weight as of this encounter: 89.6 kg.   Hypokalemia/hypomagnesemia:  -Magnesium slightly low at 1.8 on 9/29 -Started on magnesium 400 mg per tube daily on 10/5 -Pete labs on 10/7   Hypertension:  -BPs fairly stable in 90s to 100s.  -Entresto dose discontinued for suboptimal blood pressure readings   -Continue Coreg Aldactone and Entresto.   History of SVT  -Maintaining sinus rhythm with beta-blocker described above   Diarrhea  -Resolved.   ?Aspiration pneumonia  -Has completed completed antibiotics     Data Reviewed: Basic Metabolic Panel: Recent Labs  Lab 11/12/19 2205 11/13/19 0240  NA 138 138  K 4.4 4.1  CL 102 103  CO2 24 24  GLUCOSE 100* 103*  BUN 13 13  CREATININE 0.56 0.54  CALCIUM 9.9 9.6  MG 2.0  --    Liver Function Tests: Recent Labs  Lab 11/12/19 2205  AST 31  ALT 35  ALKPHOS 104  BILITOT 0.2*  PROT 7.9  ALBUMIN 3.5   No results for input(s): LIPASE, AMYLASE in the last 168 hours. No results for input(s): AMMONIA in the last 168 hours. CBC: Recent Labs  Lab 11/12/19 2205  WBC 8.2  NEUTROABS 4.4  HGB 10.6*  HCT 34.8*  MCV 83.5  PLT 559*   Cardiac Enzymes: No results for input(s): CKTOTAL, CKMB, CKMBINDEX, TROPONINI in the last 168 hours. BNP (last 3 results) Recent Labs    10/13/19 0440 10/14/19 0500 10/15/19 0552  BNP 186.1* 200.4* 168.9*    ProBNP (last 3 results) No results for input(s): PROBNP in the last 8760 hours.  CBG: Recent Labs  Lab 11/12/19 1616 11/12/19 1914 11/13/19 0004 11/13/19 0434 11/13/19 0809  GLUCAP 123* 111* 146* 97 106*    No results found  for this or any previous visit (from the past 240 hour(s)).   Studies: CT HEAD WO CONTRAST  Result Date: 11/12/2019 CLINICAL DATA:  Delirium. EXAM: CT HEAD WITHOUT CONTRAST TECHNIQUE: Contiguous axial images were obtained from the base of the skull through the vertex without intravenous contrast. COMPARISON:  Head CT 09/23/2019.  Brain MRI 10/07/2019 FINDINGS: Brain: Slight motion artifact limitations. No intracranial hemorrhage, mass effect, or midline shift. No hydrocephalus. The basilar cisterns are patent. No evidence of territorial infarct or acute ischemia. Gray-white differentiation is preserved. No extra-axial or intracranial fluid collection. Vascular: No hyperdense vessel or unexpected calcification. Skull: No fracture or focal lesion. Sinuses/Orbits: Paranasal sinuses and mastoid air cells are clear. The visualized orbits are unremarkable. Other: None. IMPRESSION: Unremarkable noncontrast head CT allowing for mild motion artifact limitations. Electronically Signed   By: 10/09/2019 M.D.   On: 11/12/2019 22:10    Scheduled Meds: . aspirin  81 mg  Per Tube Daily  . atorvastatin  80 mg Per Tube Daily  . carvedilol  6.25 mg Per Tube BID WC  . dapagliflozin propanediol  5 mg Oral Daily  . digoxin  0.125 mg Per Tube Daily  . enoxaparin (LOVENOX) injection  40 mg Subcutaneous Q24H  . feeding supplement (ENSURE ENLIVE)  237 mL Oral Q24H  . feeding supplement (OSMOLITE 1.5 CAL)  237 mL Per Tube TID  . feeding supplement (PROSource TF)  45 mL Per Tube TID  . hydrOXYzine  10 mg Per Tube BID  . influenza vac split quadrivalent PF  0.5 mL Intramuscular Tomorrow-1000  . insulin aspart  0-15 Units Subcutaneous Q4H  . metaxalone  400 mg Oral TID  . OXcarbazepine  75 mg Per Tube BID  . prenatal multivitamin  1 tablet Per Tube Q1200  . sacubitril-valsartan  1 tablet Per Tube BID  . spironolactone  25 mg Per Tube Daily   Continuous Infusions: . sodium chloride Stopped (11/06/19 1900)     Active Problems:   Cardiac arrest (HCC)   Acute combined systolic and diastolic heart failure (HCC)   Ventricular tachycardia, polymorphic (HCC)   Encounter for central line placement   Respiratory failure (HCC)   Acute metabolic encephalopathy   Anoxic brain injury [facility](HCC)   Dysphagia   Physical deconditioning   Acute delirium   Consultants:  Cardiology  PCCM  Interventional radiology  Neurology  CIR  Psychiatry  Procedures:  8/17 cardiac catheterization  8/18 EEG  8/18 echocardiogram 1. Septal and apical akinesis Distal anterior wall and mid/distal inferior wall hypokinesis Hyperdynamic basal function . Left ventricular ejection fraction, by estimation, is 30 to 35%. The left ventricle has moderately decreased function. The left ventricle demonstrates regional wall motion abnormalities (see scoring diagram/findings for description). The left ventricular internal cavity size was mildly dilated. Left ventricular diastolic parameters are indeterminate. 2. Right ventricular systolic function is normal. The right ventricular size is normal. 3. The mitral valve is normal in structure. Trivial mitral valve regurgitation. No evidence of mitral stenosis. 4. The aortic valve was not well visualized. Aortic valve regurgitation is not visualized. No aortic stenosis is present. 5. The inferior vena cava is normal in size with greater  8/19 continuous EEG  8/22 EEG  8/31 EEG  8/31 gastrostomy tube placement per interventional radiology  Antibiotics: Anti-infectives (From admission, onward)   Start     Dose/Rate Route Frequency Ordered Stop   10/09/19 2100  vancomycin (VANCOREADY) IVPB 1500 mg/300 mL  Status:  Discontinued        1,500 mg 150 mL/hr over 120 Minutes Intravenous Every 12 hours 10/09/19 1237 10/10/19 1121   10/09/19 0800  ceFEPIme (MAXIPIME) 2 g in sodium chloride 0.9 % 100 mL IVPB        2 g 200 mL/hr over 30 Minutes Intravenous Every 8 hours 10/09/19  0752 10/15/19 2147   10/09/19 0800  vancomycin (VANCOREADY) IVPB 2000 mg/400 mL        2,000 mg 200 mL/hr over 120 Minutes Intravenous  Once 10/09/19 0752 10/09/19 1147   10/07/19 1540  ceFAZolin (ANCEF) 2-4 GM/100ML-% IVPB       Note to Pharmacy: [place]rie NoeSykes, Ashley   : cabinet override      10/07/19 1540 10/08/19 0344   10/07/19 1400  ceFAZolin (ANCEF) IVPB 2g/100 mL premix        2 g 200 mL/hr over 30 Minutes Intravenous To Radiology 10/06/19 1339 10/07/19 1627   09/30/19 0300  vancomycin (  VANCOCIN) IVPB 1000 mg/200 mL premix  Status:  Discontinued        1,000 mg 200 mL/hr over 60 Minutes Intravenous Every 8 hours 09/29/19 1809 10/01/19 0853   09/29/19 1815  ceFEPIme (MAXIPIME) 2 g in sodium chloride 0.9 % 100 mL IVPB        2 g 200 mL/hr over 30 Minutes Intravenous Every 8 hours 09/29/19 1803 10/05/19 1801   09/29/19 1815  vancomycin (VANCOREADY) IVPB 2000 mg/400 mL        2,000 mg 200 mL/hr over 120 Minutes Intravenous  Once 09/29/19 1803 09/29/19 2137   09/24/19 2200  cefTRIAXone (ROCEPHIN) 2 g in sodium chloride 0.9 % 100 mL IVPB        2 g 200 mL/hr over 30 Minutes Intravenous Daily at bedtime 09/24/19 1201 09/28/19 2338   09/24/19 0330  cefTRIAXone (ROCEPHIN) 1 g in sodium chloride 0.9 % 100 mL IVPB  Status:  Discontinued        1 g 200 mL/hr over 30 Minutes Intravenous Daily at bedtime 09/24/19 0245 09/24/19 1201   09/24/19 0300  azithromycin (ZITHROMAX) 500 mg in sodium chloride 0.9 % 250 mL IVPB  Status:  Discontinued        500 mg 250 mL/hr over 60 Minutes Intravenous Daily at bedtime 09/24/19 0245 09/24/19 1103       Time spent: 25    Junious Silk ANP  [facility] Pager [phone]. If 7PM-7AM, please contact night-coverage at [email or web address] 11/13/2019, 10:58 AM  LOS: 50 days

## 2019-11-13 NOTE — Progress Notes (Addendum)
   11/13/19 0851  SLP Visit Information  SLP Received On 11/13/19  Reason Eval/Treat Not Completed n/a  General Information  Behavior/Cognition Alert;Pleasant mood;Confused  Patient Positioning Upright in bed  HPI Pt is a 34 y.o. female with PMH of HTN who was admitted 09/23/19 2 weeks post partum after emergent C section due to rupture of membranes with VF arrest. Pt with enterobacter PNA and encephalopathy, trach placed 8/30 and PEG on 8/31. MRI 8/31: No evidence of anoxic brain injury. Pt changed to #4 cuffless trach on 9/15; capped on 9/21 and decannulated on 9/25. MBS 9/22, POs started - primary oral dysphagia.    Temperature Spikes Noted No  Respiratory Status Room air  Oral Cavity - Dentition Adequate natural dentition  Patient observed directly with PO's Yes  Type of PO's observed Dysphagia 3 (soft);Thin liquids  Feeding Needs assist;Able to feed self  Liquids provided via Straw  Treatment Provided  Treatment provided Dysphagia: Pt alert, communicating in full sentences, smiling and interactive. Her mother was present. Session focused on right-sided attention to oral cavity, recognition of residue, use of tongue sweep (which was effective) and liquid wash to clear solid residue. Pt stated "I know I'm holding it in my mouth but I don't know why!" She required verbal/visual cues to clear and follow-through with mastication.   Per MBS, her pharyngeal swallow is quite good, and oral issues appear to be sensory and attentional in origin.  Continue current dysphagia 3 diet, thin liquids.  Pt could really benefit from a speech/language/cognitive evaluation now that she is talking and interactive.  Requested eval via Secure Chat to hospitalist practice.     Dysphagia Treatment  Treatment Methods Skilled observation  Family/Caregiver Educated patient  Oral Phase Signs & Symptoms Prolonged bolus formation;Right pocketing;Oral holding  Type of cueing Verbal;Tactile;Visual  Amount of cueing  Moderate  Pain Assessment  Pain Assessment Faces  Faces Pain Scale 0  SLP - End of Session  Patient left in bed;with family/visitor present  Assessment / Recommendations / Plan  Plan Continue with current plan of care  Dysphagia Recommendations  Diet recommendations Dysphagia 3 (mechanical soft);Thin liquid  Liquids provided via Cup;Teaspoon;Straw  Medication Administration Via alternative means  Supervision Staff to assist with self feeding;Full supervision/cueing for compensatory strategies  Compensations Slow rate;Small sips/bites;Minimize environmental distractions;Follow solids with liquid  Postural Changes and/or Swallow Maneuvers Seated upright 90 degrees  General Recommendations  Oral Care Recommendations Oral care BID;Staff/trained caregiver to provide oral care  Follow up Recommendations Inpatient Rehab  SLP Visit Diagnosis Dysphagia, oral phase (R13.11)  Progression Toward Goals  Progression toward goals Progressing toward goals  SLP Time Calculation  SLP Start Time (ACUTE ONLY) 1428  SLP Stop Time (ACUTE ONLY) 1441  SLP Time Calculation (min) (ACUTE ONLY) 13 min  SLP Evaluations  $ SLP Speech Visit 1 Visit  SLP Evaluations  $Swallowing Treatment 1 Procedure

## 2019-11-13 NOTE — Progress Notes (Signed)
Mobility Specialist - Progress Note   11/13/19 1532  Mobility  Activity Ambulated in hall  Level of Assistance +2 (takes two people)  Teaching laboratory technician (for chair follow)  Distance Ambulated (ft) 100 ft (50 ft x 2)  Mobility Response Tolerated fair  Mobility performed by Mobility specialist;Nurse tech;Nurse  $Mobility charge 1 Mobility    Pre-mobility: 96 HR During mobility: 116 HR Post-mobility: 107 HR  Pt ambulated w/ three musketeer hold and wheelchair for chair follow. Pt remains impulsive and required assistance for stability. She required a sitting rest break due to fatigue. Pt back in bed after ambulation, mother in room.   Mamie Levers Mobility Specialist Mobility Specialist Phone: [phone]

## 2019-11-13 NOTE — Progress Notes (Signed)
HOSPITAL MEDICINE OVERNIGHT EVENT NOTE    I was notified by nursing at the beginning of shift of an approximate 30 minute episode of minimal responsiveness.  Patient stared forward, not responsive to verbal or painful stimuli.  Patient exhibited no tonic/clonic activity nor did she experience urinary or fecal incontinence.  Episode eventually resolved with patient exhibiting ability to move all 4 extremities and able to converse with staff with some associated lethargy.    I ordered CBCD, CMP, CT head, ECG and VBG all of which did not reveal any acute issue.  I went to evaluate the patient at the bedside at approximately 1AM and witnessed a similar episode, although this time the episode was much shorter.  Chart reviewed, patient has suspected anoxic brain injury S/P Vfib arrest.   Patient may have had similar neurologic findings in the past although with the prolonged hospitalization this is not entirely clear.  Patient may be experiencing seizure activity, or perhaps may be lethargic to the sedating agents the patient is receiving (Hydroxyzine, Oxcarbazepine).   Will get EEG in AM, notify day team to consider discussion with neurology if this persists.  I have asked nursing to notify me if she exhibits more episodes.    Marinda Elk  MD [facility]

## 2019-11-13 NOTE — Procedures (Signed)
Patient Name: Beverly [name]  MRN: [id number]  Epilepsy Attending: Charlsie Quest  Referring Physician/Provider: Dr Bethel Born Date: 11/13/2019 Duration: 24.23 minutes  Patient history: 34 year old female with seizure-like episode.  EEG to evaluate for seizures.  Level of alertness: Awake, asleep  AEDs during EEG study: Oxcarbazepine  Technical aspects: This EEG study was done with scalp electrodes positioned according to the 10-20 International system of electrode placement. Electrical activity was acquired at a sampling rate of 500Hz  and reviewed with a high frequency filter of 70Hz  and a low frequency filter of 1Hz . EEG data were recorded continuously and digitally stored.   Description: The posterior dominant rhythm consists of 9 Hz activity of moderate voltage (25-35 uV) seen predominantly in posterior head regions, symmetric and reactive to eye opening and eye closing. Sleep was characterized by vertex waves, sleep spindles (12 to 14 Hz), maximal frontocentral region. Physiologic photic driving was seen during photic stimulation.  Hyperventilation was not performed.     IMPRESSION: This study is within normal limits. No seizures or epileptiform discharges were seen throughout the recording.  Beverly [name] 

## 2019-11-13 NOTE — Progress Notes (Signed)
SLP Cancellation Note  Patient Details Name: Beverly [name] MRN: [id number] DOB: [birth year]   Cancelled treatment:       Reason Eval/Treat Not Completed: Other (comment) (pt currently lethargic per RN, Madison Hickman.  note events of last night.  SLP requested Tory contact SLP if pt awakens adequately to be seen as her diet was advanced yesterday.)   Chales Abrahams 11/13/2019, 8:52 AM  Rolena Infante, MS [facility] SLP Acute Rehab Services Office [phone]

## 2019-11-13 NOTE — Progress Notes (Signed)
EEG Completed; Results Pending  

## 2019-11-14 DIAGNOSIS — I469 Cardiac arrest, cause unspecified: Secondary | ICD-10-CM | POA: Diagnosis not present

## 2019-11-14 DIAGNOSIS — R5381 Other malaise: Secondary | ICD-10-CM | POA: Diagnosis not present

## 2019-11-14 LAB — GLUCOSE, CAPILLARY
Glucose-Capillary: 106 mg/dL — ABNORMAL HIGH (ref 70–99)
Glucose-Capillary: 129 mg/dL — ABNORMAL HIGH (ref 70–99)
Glucose-Capillary: 144 mg/dL — ABNORMAL HIGH (ref 70–99)
Glucose-Capillary: 89 mg/dL (ref 70–99)
Glucose-Capillary: 93 mg/dL (ref 70–99)

## 2019-11-14 NOTE — Progress Notes (Addendum)
Pt combative tonight- refused vitals and sugar from NT. When I went to change her tele battery- pt hitting and scratching this RN saying "don't touch my stuff". Currently refusing me to assess her and admin meds. Will re-attempt as able.  2300- pt still resistant but able to be convinced to let this RN take vitals and admin meds. No needs at this time. Will continue to monitor.  0000- continues to refused blood sugar checks

## 2019-11-14 NOTE — Progress Notes (Signed)
[facility] PROGRESS NOTE  Beverly [name] EAV:[id number] DOB: 12/09/1985 DOA: 09/24/2019 PCP: Patient, No Pcp Per  Status: Inpatient-Remains inpatient appropriate because:Altered mental status, Ongoing diagnostic testing needed not appropriate for outpatient work up, Unsafe d/c plan, IV treatments appropriate due to intensity of illness or inability to take PO and Inpatient level of care appropriate due to severity of illness   Dispo: The patient is from: Home              Anticipated d/c is to: SNF vs inpatient rehabilitation at outside facility              Anticipated d/c date is: > 3 days              Patient currently is not medically stable to d/c.  Continues to have episodes of acute delirium and agitation in the context of anoxic brain injury but as of 10/1 these episodes are improving after the addition of appropriate pharmacotherapy. PT and OT have seen patient and recommended inpatient rehabilitation vs SNF placement once otherwise medically stable.  Case has been reviewed by [facility] IP re[facility] and they felt they could not provide the level of care the patient needed.  Other local inpatient rehab options are being explored.  Unfortunately [facility] is not "in network" for patient's insurance coverage.  Medicaid application is also pending.  10/5 [facility] in [place] reviewing clinicals but mother apprehensive about sending patient this far away noting he would be unable to visit patient daily given other responsibilities in the [place] area.  Unfortunately this would be an excellent facility for the patient since the main campus in [place] focuses on brain injuries.  **If unable to locate an inpatient rehabilitation facility need to consider discharge to a SNF with focus on management of brain injury**  Code Status: Full Family Communication: Father at bedside 10/8 DVT prophylaxis: Lovenox Vaccination status: Has not received Covid vaccine  HPI: 34 year old female presents to  the hospital 2 weeks postpartum with chest pain and in the ED sustained V. fib arrest with V. fib x3 epi x1, EKG showed ST elevation underwent Cath Lab at [facility] showed aneurysmal dilation(dissection?) of the LAD was identified. She was transferred to [facility] for ICU admission. LVEF 30% EF in cath lab.    Significant events Echo 8/18> LVEF 30-35%, septal and apical akinesis distal anterior wall an d mid/distal inferior wall hypokinesis hyperdynamic basal function. LV size mildly dilated.  Patient suffered anoxic brain injury. 8.23:MRI brain  8/30 underwent tracheostomy 8/30 PEG tube placement 9/10: Continues to be encephalopathic, able to open eyes and track but not following any commands and not talking,is moving extremities independently 9/15: Tracheostomy downsized to # 4 cufless. 9/20:Trach CAP trial, PCCM following SNF planned for 9/30- 30 days out from trach 9/21:Patient is showing improvement with the speech therapy and PT-recommendation changed from SNF to inpatient rehab.  9/25: Trach removed.  Subjective: Patient alert and eating breakfast.  Needed to be pulled up in the bed.  Father at bedside.  Discussed with both patient and father option of discharging to SNF in [place] with brain injury rehabilitation focus.  Although both agree that this type of focus would be wonderful that it would be too hard both on the patient to be separated from her family and the family to try to regularly visit.  Objective: Vitals:   11/14/19 0425 11/14/19 0755  BP: 106/67 104/64  Pulse: 80 86  Resp: 18   Temp: (!) 97.5  F (36.4 C) 97.7 F (36.5 C)  SpO2: 98% 100%    Intake/Output Summary (Last 24 hours) at 11/14/2019 1216 Last data filed at 11/14/2019 1036 Gross per 24 hour  Intake 837 ml  Output --  Net 837 ml   Filed Weights   11/12/19 0402 11/13/19 0438 11/14/19 0530  Weight: 88.4 kg 89.6 kg 86.9 kg    Exam:  Constitutional: NAD, calm, comfortable  Respiratory: clear  to auscultation bilaterally. Normal respiratory effort. Room air Cardiovascular: Regular rate and rhythm, no murmurs / rubs / gallops. No extremity edema. 2+ pedal pulses.   Abdomen: no tenderness, no masses palpated. Bowel sounds positive.  PEG tube in place Musculoskeletal: no clubbing / cyanosis. No contractures. Normal muscle tone.  Skin: no rashes, lesions, ulcers. No induration Neurologic: CN 2-12 grossly intact, speech patterns more coherent although still has some disfluency and evidence of perseveration sensation grossly intact, no evidence of extremity spasms on exam.  Moving all extremities spontaneously x4.  Patient left hand dominant and left side strength including shoulder shrug appears to be weaker at 3/5 with right side strength 4/5. Psychiatric: Awake, engaging in appropriate conversation today.  Oriented to name and place at this time.  Also appears to be gaining insight into the severity of her illness.  Smiles appropriately.   Assessment/Plan: V. fib arrest due to anterior MI: -s/p cardiac cath- aneurysm proximal LAD with dissection was culprit. -Continue aspirin, Lipitor,Coreg    Acute systolic congestive heart failure with EF 35%: 2/2 MI.   Daily weights with strict I's/O- has remained stable from a respiratory standpoint with persistent +3 L balance; unclear true dry weight noting at time of admission patient weighed 228 pounds with current weight 198 pounds Continue Coreg,Entresto Aldactone  and digoxin as above. Echocardiogram was completed 8/18 and anticipate will need to be repeated in November 3 month mark Last electrolyte panel was on 9/29-plan to repeat on 10/7  Prediabetes diabetes 2 in obese -HgbA1c 5.7 -With underlying systolic heart failure will initiate Farxiga 5 mg daily   Acute encephalopathy/acute delirium 2/2 anoxic brain injury:   -Had extensive work-up with EEG, MRI brain.   -Initially trach and PEG dependent.  But thus far has been  decannulated -Oral intake remains inconsistent therefore PEG remains in place -Continues to have episodes of confusion, agitation as well as amnesia.  -Continue Trileptal as recommended by psychiatry. -Continue Vistaril for anxiety -Also documented with significant spasticity during PT and OT sessions-Skelaxin 400 mg TID initiated on 10/7-we will not use baclofen at this juncture given CNS effects -SLP working with patient for speech and cognition rehab  Dysphagia -SLP following and patient currently on dysphagia 3 diet but with inconsistent oral intake so recommendation is to continue tube feeding-May need to consider cutting tube feeding volume in order to promote increased appetite  Physical deconditioning 2/2 anoxic brain injury -PT/OT documents slow but continued improvement during therapy sessions.  10/8 continues to participate with therapies well.  Difficult issues identified are altered cognition, abnormal movements, mixed tone balance deficits and impulsivity which are impacting self-care mobility independence.  He is now demonstrating better sitting tolerance with less physical assist for balance which allows her to participate with independent grooming.  Continue recommendation for inpatient rehabilitation  Nutrition Status: Nutrition Problem: Increased nutrient needs Etiology: post-op healing Signs/Symptoms: estimated needs Interventions: Tube feeding Estimated body mass index is 27.49 kg/m as calculated from the following:   Height as of this encounter: 5\' 10"  (1.778 m).  Weight as of this encounter: 86.9 kg.   Hypokalemia/hypomagnesemia:  -Magnesium slightly low at 1.8 on 9/29 -Started on magnesium 400 mg per tube daily on 10/5 -Pete labs on 10/7   Hypertension:  -BPs fairly stable in 90s to 100s.  -Entresto dose discontinued for suboptimal blood pressure readings   -Continue Coreg, Aldactone and Entresto.   History of SVT  -Maintaining sinus rhythm with beta-blocker  described above   Diarrhea  -Resolved.   ?Aspiration pneumonia  -Has completed completed antibiotics     Data Reviewed: Basic Metabolic Panel: Recent Labs  Lab 11/12/19 2205 11/13/19 0240  NA 138 138  K 4.4 4.1  CL 102 103  CO2 24 24  GLUCOSE 100* 103*  BUN 13 13  CREATININE 0.56 0.54  CALCIUM 9.9 9.6  MG 2.0  --    Liver Function Tests: Recent Labs  Lab 11/12/19 2205  AST 31  ALT 35  ALKPHOS 104  BILITOT 0.2*  PROT 7.9  ALBUMIN 3.5   No results for input(s): LIPASE, AMYLASE in the last 168 hours. No results for input(s): AMMONIA in the last 168 hours. CBC: Recent Labs  Lab 11/12/19 2205  WBC 8.2  NEUTROABS 4.4  HGB 10.6*  HCT 34.8*  MCV 83.5  PLT 559*   Cardiac Enzymes: No results for input(s): CKTOTAL, CKMB, CKMBINDEX, TROPONINI in the last 168 hours. BNP (last 3 results) Recent Labs    10/13/19 0440 10/14/19 0500 10/15/19 0552  BNP 186.1* 200.4* 168.9*    ProBNP (last 3 results) No results for input(s): PROBNP in the last 8760 hours.  CBG: Recent Labs  Lab 11/13/19 2030 11/14/19 0017 11/14/19 0420 11/14/19 0839 11/14/19 1140  GLUCAP 126* 144* 89 93 106*    No results found for this or any previous visit (from the past 240 hour(s)).   Studies: EEG  Result Date: 11/13/2019 Charlsie Quest, MD     11/13/2019 12:37 PM Patient Name: Beverly [name] MRN: [id number] Epilepsy Attending: Charlsie Quest Referring Physician/Provider: Dr Bethel Born Date: 11/13/2019 Duration: 24.23 minutes Patient history: 34 year old female with seizure-like episode.  EEG to evaluate for seizures. Level of alertness: Awake, asleep AEDs during EEG study: Oxcarbazepine Technical aspects: This EEG study was done with scalp electrodes positioned according to the 10-20 International system of electrode placement. Electrical activity was acquired at a sampling rate of 500Hz  and reviewed with a high frequency filter of 70Hz  and a low frequency filter of 1Hz . EEG  data were recorded continuously and digitally stored. Description: The posterior dominant rhythm consists of 9 Hz activity of moderate voltage (25-35 uV) seen predominantly in posterior head regions, symmetric and reactive to eye opening and eye closing. Sleep was characterized by vertex waves, sleep spindles (12 to 14 Hz), maximal frontocentral region. Physiologic photic driving was seen during photic stimulation.  Hyperventilation was not performed.   IMPRESSION: This study is within normal limits. No seizures or epileptiform discharges were seen throughout the recording.   CT HEAD WO CONTRAST  Result Date: 11/12/2019 CLINICAL DATA:  Delirium. EXAM: CT HEAD WITHOUT CONTRAST TECHNIQUE: Contiguous axial images were obtained from the base of the skull through the vertex without intravenous contrast. COMPARISON:  Head CT 09/23/2019.  Brain MRI 10/07/2019 FINDINGS: Brain: Slight motion artifact limitations. No intracranial hemorrhage, mass effect, or midline shift. No hydrocephalus. The basilar cisterns are patent. No evidence of territorial infarct or acute ischemia. Gray-white differentiation is preserved. No extra-axial or intracranial fluid collection. Vascular: No hyperdense  vessel or unexpected calcification. Skull: No fracture or focal lesion. Sinuses/Orbits: Paranasal sinuses and mastoid air cells are clear. The visualized orbits are unremarkable. Other: None. IMPRESSION: Unremarkable noncontrast head CT allowing for mild motion artifact limitations. Electronically Signed   By: Narda RutherfordMelanie  Sanford M.D.   On: 11/12/2019 22:10    Scheduled Meds: . aspirin  81 mg Per Tube Daily  . atorvastatin  80 mg Per Tube Daily  . carvedilol  6.25 mg Per Tube BID WC  . dapagliflozin propanediol  5 mg Oral Daily  . digoxin  0.125 mg Per Tube Daily  . enoxaparin (LOVENOX) injection  40 mg Subcutaneous Q24H  . feeding supplement (ENSURE ENLIVE)  237 mL Oral Q24H  . feeding supplement (OSMOLITE 1.5  CAL)  237 mL Per Tube TID  . feeding supplement (PROSource TF)  45 mL Per Tube TID  . hydrOXYzine  10 mg Per Tube BID  . influenza vac split quadrivalent PF  0.5 mL Intramuscular Tomorrow-1000  . insulin aspart  0-15 Units Subcutaneous Q4H  . metaxalone  400 mg Oral TID  . OXcarbazepine  75 mg Per Tube BID  . prenatal multivitamin  1 tablet Per Tube Q1200  . sacubitril-valsartan  1 tablet Per Tube BID  . spironolactone  25 mg Per Tube Daily   Continuous Infusions: . sodium chloride Stopped (11/06/19 1900)    Active Problems:   Cardiac arrest (HCC)   Acute combined systolic and diastolic heart failure (HCC)   Ventricular tachycardia, polymorphic (HCC)   Encounter for central line placement   Respiratory failure (HCC)   Acute metabolic encephalopathy   Anoxic brain injury [facility](HCC)   Dysphagia   Physical deconditioning   Acute delirium   Consultants:  Cardiology  PCCM  Interventional radiology  Neurology  CIR  Psychiatry  Procedures:  8/17 cardiac catheterization  8/18 EEG  8/18 echocardiogram 1. Septal and apical akinesis Distal anterior wall and mid/distal inferior wall hypokinesis Hyperdynamic basal function . Left ventricular ejection fraction, by estimation, is 30 to 35%. The left ventricle has moderately decreased function. The left ventricle demonstrates regional wall motion abnormalities (see scoring diagram/findings for description). The left ventricular internal cavity size was mildly dilated. Left ventricular diastolic parameters are indeterminate. 2. Right ventricular systolic function is normal. The right ventricular size is normal. 3. The mitral valve is normal in structure. Trivial mitral valve regurgitation. No evidence of mitral stenosis. 4. The aortic valve was not well visualized. Aortic valve regurgitation is not visualized. No aortic stenosis is present. 5. The inferior vena cava is normal in size with greater  8/19 continuous EEG  8/22  EEG  8/31 EEG  8/31 gastrostomy tube placement per interventional radiology  Antibiotics: Anti-infectives (From admission, onward)   Start     Dose/Rate Route Frequency Ordered Stop   10/09/19 2100  vancomycin (VANCOREADY) IVPB 1500 mg/300 mL  Status:  Discontinued        1,500 mg 150 mL/hr over 120 Minutes Intravenous Every 12 hours 10/09/19 1237 10/10/19 1121   10/09/19 0800  ceFEPIme (MAXIPIME) 2 g in sodium chloride 0.9 % 100 mL IVPB        2 g 200 mL/hr over 30 Minutes Intravenous Every 8 hours 10/09/19 0752 10/15/19 2147   10/09/19 0800  vancomycin (VANCOREADY) IVPB 2000 mg/400 mL        2,000 mg 200 mL/hr over 120 Minutes Intravenous  Once 10/09/19 0752 10/09/19 1147   10/07/19 1540  ceFAZolin (ANCEF) 2-4 GM/100ML-% IVPB  Note to Pharmacy: Erie Noe   : cabinet override      10/07/19 1540 10/08/19 0344   10/07/19 1400  ceFAZolin (ANCEF) IVPB 2g/100 mL premix        2 g 200 mL/hr over 30 Minutes Intravenous To Radiology 10/06/19 1339 10/07/19 1627   09/30/19 0300  vancomycin (VANCOCIN) IVPB 1000 mg/200 mL premix  Status:  Discontinued        1,000 mg 200 mL/hr over 60 Minutes Intravenous Every 8 hours 09/29/19 1809 10/01/19 0853   09/29/19 1815  ceFEPIme (MAXIPIME) 2 g in sodium chloride 0.9 % 100 mL IVPB        2 g 200 mL/hr over 30 Minutes Intravenous Every 8 hours 09/29/19 1803 10/05/19 1801   09/29/19 1815  vancomycin (VANCOREADY) IVPB 2000 mg/400 mL        2,000 mg 200 mL/hr over 120 Minutes Intravenous  Once 09/29/19 1803 09/29/19 2137   09/24/19 2200  cefTRIAXone (ROCEPHIN) 2 g in sodium chloride 0.9 % 100 mL IVPB        2 g 200 mL/hr over 30 Minutes Intravenous Daily at bedtime 09/24/19 1201 09/28/19 2338   09/24/19 0330  cefTRIAXone (ROCEPHIN) 1 g in sodium chloride 0.9 % 100 mL IVPB  Status:  Discontinued        1 g 200 mL/hr over 30 Minutes Intravenous Daily at bedtime 09/24/19 0245 09/24/19 1201   09/24/19 0300  azithromycin (ZITHROMAX) 500 mg in  sodium chloride 0.9 % 250 mL IVPB  Status:  Discontinued        500 mg 250 mL/hr over 60 Minutes Intravenous Daily at bedtime 09/24/19 0245 09/24/19 1103       Time spent: 25    Junious Silk ANP  [facility] Pager [phone]. If 7PM-7AM, please contact night-coverage at [email or web address] 11/14/2019, 12:16 PM  LOS: 51 days

## 2019-11-14 NOTE — TOC Progression Note (Signed)
Transition of Care (TOC) - Progression Note  Donn Pierini RN, BSN Transitions of Care Unit 4E- RN Case Manager See Treatment Team for direct phone #    Patient Details  Name: JERMIA RIGSBY MRN: [id number] Date of Birth: 1985-11-04  Transition of Care [facility]) CM/SW Contact  Zenda Alpers, Lenn Sink, RN Phone Number: 11/14/2019, 4:00 PM  Clinical Narrative:    Have f/u with mom regarding INPT rehab options vs SNF options- have received word back from [facility] they can potentially offer bed would need to review more clinicals prior to offer- mom is still hesitant about this option due to the distance from home,  As per mom's request also reached out to [facility] in [place]- they are currently on a 2 week wait list and would have to review clinicals to see if they can provide what pt needs and check to see if they are in network with insurance.   Have gone ahead and faxed out with mom's permission to in-network STSNF options- [facility] choice [facility], will f/u on Monday to see if there are any bed offers from the skilled facilities. Can also reach back out to [facility] INPT rehab to see if they will re-look at patient as she has made some progress since the referral to them. At this point- mom understands her options for INPT rehab may only be for [facility] vs SNF if she has any bed offers there. CM to f/u on Monday.     Expected Discharge Plan: IP Rehab Facility Barriers to Discharge: Continued Medical Work up, SNF Pending Medicaid, SNF Pending bed offer  Expected Discharge Plan and Services Expected Discharge Plan: IP Rehab Facility In-house Referral: Clinical Social Work   Post Acute Care Choice: IP Rehab                                         Social Determinants of Health (SDOH) Interventions    Readmission Risk Interventions No flowsheet data found.

## 2019-11-14 NOTE — Progress Notes (Signed)
Physical Therapy Treatment Patient Details Name: Beverly [name] MRN: [id number] DOB: Sep 17, 1985 Today's Date: 11/14/2019    History of Present Illness Pt is a 34 y.o. female admitted 09/23/19 with intermittent chest discomfort; of note, pt 2-weeks post-partum (had emergent C-section); while in ED, pt with VF cardiac arrest requiring intubation. Brain MRI 8/23 with no acute abnormality. S/p trach and PEG tube 8/30. Course complicated by encephalopathy, anoxic brain injury, enterobacterial PNA. Transferred out of ICU On 9/4. PMH includes HTN.    PT Comments    Pt tolerates treatment well, demonstrating improved use of UEs for balance control this session. Pt does continue to require physical assistance for all mobility with intermittent spasticity and extensor tone that greatly increases falls risk. Pt also continues to demonstrate poor safety awareness at this time. Pt will benefit from continued acute PT POC to improve balance and functional mobility quality. PT recommends discharge to CIR with a brain injury focus being preferable.   Follow Up Recommendations  CIR;Supervision/Assistance - 24 hour     Equipment Recommendations  Wheelchair (measurements PT);Hospital bed (would need tilt in space wheelchair if going home today)    Recommendations for Other Services       Precautions / Restrictions Precautions Precautions: Fall Precaution Comments: incontinence Restrictions Weight Bearing Restrictions: No    Mobility  Bed Mobility Overal bed mobility: Needs Assistance Bed Mobility: Supine to Sit;Sit to Supine     Supine to sit: Min assist Sit to supine: Min assist   General bed mobility comments: minA for trunk management and safety in sup to sit, minA for LE management in sit to sup  Transfers Overall transfer level: Needs assistance Equipment used: 2 person hand held assist Transfers: Sit to/from Stand Sit to Stand: Mod assist;+2 physical assistance         General  transfer comment: physical assistance due to posterior lean, intermittent extensor tone in LEs increasing falls risk, PT provides RLE knee block  Ambulation/Gait Ambulation/Gait assistance: Mod assist;+2 physical assistance Gait Distance (Feet): 70 Feet (additional trial of 60') Assistive device: 2 person hand held assist Gait Pattern/deviations: Step-to pattern;Decreased step length - right;Decreased step length - left;Drifts right/left;Leaning posteriorly;Trunk flexed Gait velocity: reduced Gait velocity interpretation: <1.8 ft/sec, indicate of risk for recurrent falls General Gait Details: pt with increased postural sway, variable and in all directions but most frequently anteriorly this session. Cues for upright posture   Stairs             Wheelchair Mobility    Modified Rankin (Stroke Patients Only)       Balance Overall balance assessment: Needs assistance Sitting-balance support: No upper extremity supported;Feet supported Sitting balance-Leahy Scale: Poor Sitting balance - Comments: minA at edge of bed Postural control: Posterior lean Standing balance support: Bilateral upper extremity supported Standing balance-Leahy Scale: Poor Standing balance comment: min-modA x2 due to increased sway                            Cognition Arousal/Alertness: Awake/alert Behavior During Therapy: Impulsive Overall Cognitive Status: Impaired/Different from baseline Area of Impairment: Attention;Memory;Safety/judgement;Following commands;Awareness;Problem solving               Rancho Levels of Cognitive Functioning Rancho Los Amigos Scales of Cognitive Functioning: Confused/inappropriate/non-agitated   Current Attention Level: Sustained Memory: Decreased recall of precautions;Decreased short-term memory Following Commands: Follows one step commands with increased time Safety/Judgement: Decreased awareness of safety;Decreased awareness of deficits Awareness:  Intellectual Problem  Solving: Slow processing;Difficulty sequencing;Requires verbal cues;Requires tactile cues        Exercises      General Comments General comments (skin integrity, edema, etc.): VSS on RA      Pertinent Vitals/Pain Pain Assessment: Faces Faces Pain Scale: No hurt    Home Living                      Prior Function            PT Goals (current goals can now be found in the care plan section) Acute Rehab PT Goals Patient Stated Goal: To continue rehab at the next level. Progress towards PT goals: Progressing toward goals    Frequency    Min 3X/week      PT Plan Current plan remains appropriate    Co-evaluation PT/OT/SLP Co-Evaluation/Treatment: Yes Reason for Co-Treatment: Complexity of the patient's impairments (multi-system involvement);Necessary to address cognition/behavior during functional activity;For patient/therapist safety PT goals addressed during session: Mobility/safety with mobility;Balance;Strengthening/ROM OT goals addressed during session: ADL's and self-care      AM-PAC PT "6 Clicks" Mobility   Outcome Measure  Help needed turning from your back to your side while in a flat bed without using bedrails?: A Little Help needed moving from lying on your back to sitting on the side of a flat bed without using bedrails?: A Little Help needed moving to and from a bed to a chair (including a wheelchair)?: A Lot Help needed standing up from a chair using your arms (e.g., wheelchair or bedside chair)?: A Lot Help needed to walk in hospital room?: A Lot Help needed climbing 3-5 steps with a railing? : Total 6 Click Score: 13    End of Session Equipment Utilized During Treatment: Gait belt Activity Tolerance: Patient tolerated treatment well Patient left: in bed;with call bell/phone within reach;with bed alarm set;with family/visitor present Nurse Communication: Mobility status PT Visit Diagnosis: Other abnormalities of gait  and mobility (R26.89);Difficulty in walking, not elsewhere classified (R26.2);Other symptoms and signs involving the nervous system (R29.898)     Time: 6599-3570 PT Time Calculation (min) (ACUTE ONLY): 32 min  Charges:  $Gait Training: 8-22 mins                     Arlyss Gandy, PT, DPT Acute Rehabilitation Pager: [phone]    Arlyss Gandy 11/14/2019, 11:50 AM

## 2019-11-14 NOTE — Progress Notes (Signed)
Occupational Therapy Treatment Patient Details Name: Beverly [name] MRN: [id number] DOB: 30-Aug-1985 Today's Date: 11/14/2019    History of present illness Pt is a 34 y.o. female admitted 09/23/19 with intermittent chest discomfort; of note, pt 2-weeks post-partum (had emergent C-section); while in ED, pt with VF cardiac arrest requiring intubation. Brain MRI 8/23 with no acute abnormality. S/p trach and PEG tube 8/30. Course complicated by encephalopathy, anoxic brain injury, enterobacterial PNA. Transferred out of ICU On 9/4. PMH includes HTN.   OT comments  Patient continues to participate well with acute rehab.  Declines to cognition, abnormal movements, mixed tone, balance deficits, and impulsiveness continue to impact self care and mobility independence.  She is demonstrating better sitting tolerance with less physical assist for balance, this is allowing her to try and participate more with seated grooming, but hand over hand with moderate assist is needed.  OT to continue to follow in the acute setting; but she really needs to advance to the next level of rehab, so more intensive therapies can begin.       Follow Up Recommendations  CIR    Equipment Recommendations  Wheelchair (measurements OT);Wheelchair cushion (measurements OT);Hospital bed    Recommendations for Other Services      Precautions / Restrictions Precautions Precautions: Fall Precaution Comments: incontinence Restrictions Weight Bearing Restrictions: No                                                                      ADL either performed or assessed with clinical judgement   ADL       Grooming: Wash/dry face;Moderate assistance;Sitting Grooming Details (indicate cue type and reason): hand over hand for initial guidence.  patient able to wipe eye and face with Mod A for throughness.         Upper Body Dressing : Maximal assistance;Sitting Upper Body Dressing Details  (indicate cue type and reason): balance support, and hand over hand guidence.                 Functional mobility during ADLs: +2 for physical assistance;+2 for safety/equipment;Maximal assistance General ADL Comments: PT with cues for posture and + for balance support.       Prior Functioning/Environment              Frequency  Min 2X/week        Progress Toward Goals  OT Goals(current goals can now be found in the care plan section)  Progress towards OT goals: Progressing toward goals  Acute Rehab OT Goals Patient Stated Goal: To continue rehab at the next level. OT Goal Formulation: With family Time For Goal Achievement: 11/20/19 Potential to Achieve Goals: Fair  Plan Frequency remains appropriate;Discharge plan remains appropriate    Co-evaluation    PT/OT/SLP Co-Evaluation/Treatment: Yes Reason for Co-Treatment: Complexity of the patient's impairments (multi-system involvement);Necessary to address cognition/behavior during functional activity;For patient/therapist safety   OT goals addressed during session: ADL's and self-care      AM-PAC OT "6 Clicks" Daily Activity     Outcome Measure   Help from another person eating meals?: Total Help from another person taking care of personal grooming?: A Lot Help from another person toileting, which includes using toliet, bedpan, or urinal?: Total Help from  another person bathing (including washing, rinsing, drying)?: Total Help from another person to put on and taking off regular upper body clothing?: A Lot Help from another person to put on and taking off regular lower body clothing?: Total 6 Click Score: 8    End of Session Equipment Utilized During Treatment: Gait belt  OT Visit Diagnosis: Other symptoms and signs involving cognitive function;Muscle weakness (generalized) (M62.81)   Activity Tolerance     Patient Left in bed;with call bell/phone within reach;with bed alarm set;Other (comment);with  family/visitor present;with nursing/sitter in room   Nurse Communication          Time: [phone] OT Time Calculation (min): 29 min  Charges: OT General Charges $OT Visit: 1 Visit OT Treatments $Self Care/Home Management : 8-22 mins  11/14/2019  Rich, OTR/L  Acute Rehabilitation Services  Office:  [phone]    Beverly [name] 11/14/2019, 11:19 AM

## 2019-11-14 NOTE — Progress Notes (Signed)
Mobility Specialist - Progress Note   11/14/19 1719  Mobility  Activity Ambulated in hall  Level of Assistance +2 (takes two people)  Assistive Device Wheelchair  Distance Ambulated (ft) 220 ft (Intervals: 100 ft x 1, 60 ft x 2)  Mobility Response Tolerated well  Mobility performed by Mobility specialist;Nurse;Family member  $Mobility charge 1 Mobility   Pre-mobility: 100 HR During mobility: 120-136 HR Post-mobility: 96 HR  Pt ambulated with 2 person handheld assist and chair follow from her mother. She required less verbal cues for posture, but still remained impulsive, especially when she wants to sit down.    Mamie Levers Mobility Specialist Mobility Specialist Phone: [phone]

## 2019-11-15 DIAGNOSIS — G931 Anoxic brain damage, not elsewhere classified: Secondary | ICD-10-CM | POA: Diagnosis not present

## 2019-11-15 LAB — GLUCOSE, CAPILLARY
Glucose-Capillary: 111 mg/dL — ABNORMAL HIGH (ref 70–99)
Glucose-Capillary: 93 mg/dL (ref 70–99)
Glucose-Capillary: 87 mg/dL (ref 70–99)
Glucose-Capillary: 124 mg/dL — ABNORMAL HIGH (ref 70–99)

## 2019-11-15 NOTE — Progress Notes (Signed)
  Speech Language Pathology Treatment: Dysphagia  Patient Details Name: Beverly [name] MRN: [id number] DOB: October 30, 1985 Today's Date: 11/15/2019 Time: 9735-3299 SLP Time Calculation (min) (ACUTE ONLY): 13 min  Assessment / Plan / Recommendation Clinical Impression  Pt was seen for skilled ST targeting diagnostic treatment and diet tolerance.  [name] was present for this evaluation and she stated that the pt was having difficulty masticating Dysphagia 3 solids despite use of liquid wash and other strategies.  Pt was observed with trials from her breakfast tray and she was noted to exhibit prolonged mastication with bolus pocketing on the R side which required multiple cues and liquid washes to clear for each bite.  Pt was additionally observed with pureed solids and thin liquids and she exhibited suspected timely AP transport with no oral residue noted.  Mother requested that the pt's diet be downgraded to pureed solids.  Discussed trying Dysphagia 2 (fine chop) solids instead, and mother/patient were agreeable to this change.  Therefore, recommend diet change to Dysphagia 2 solids and thin liquids with full adherence to compensatory strategies (listed below).  SLP will f/u for diet tolerance and diagnostic treatment.      HPI HPI: Pt is a 34 y.o. female with PMH of HTN who was admitted 09/23/19 2 weeks post partum after emergent C section due to rupture of membranes with VF arrest. Pt with enterobacter PNA and encephalopathy, trach placed 8/30 and PEG on 8/31. MRI 8/31: No evidence of anoxic brain injury. Pt changed to #4 cuffless trach on 9/15; capped on 9/21 and decannulated on 9/25. MBS 9/22, POs started - primary oral dysphagia.        SLP Plan  Continue with current plan of care       Recommendations  Diet recommendations: Dysphagia 2 (fine chop) Liquids provided via: Cup;Teaspoon;Straw Medication Administration: Crushed with puree Supervision: Staff to assist with self feeding;Full  supervision/cueing for compensatory strategies Compensations: Slow rate;Small sips/bites;Minimize environmental distractions;Follow solids with liquid Postural Changes and/or Swallow Maneuvers: Seated upright 90 degrees                Oral Care Recommendations: Oral care BID;Staff/trained caregiver to provide oral care Follow up Recommendations: Inpatient Rehab SLP Visit Diagnosis: Dysphagia, oral phase (R13.11) Plan: Continue with current plan of care       GO              Villa Herb M.S., CCC-SLP Acute Rehabilitation Services Office: [phone]  Shanon Rosser [facility] 11/15/2019, 10:16 AM

## 2019-11-15 NOTE — Progress Notes (Signed)
Patient seen and examined, she remain confused but pleasant and interactive, Awaiting for placement, no overt interval changes, mother at bedside.

## 2019-11-15 NOTE — Evaluation (Signed)
Speech Language Pathology Evaluation Patient Details Name: Beverly [name] MRN: [id number] DOB: [birth year] Today's Date: 11/15/2019 Time: 5093-2671 SLP Time Calculation (min) (ACUTE ONLY): 22 min  Problem List:  Patient Active Problem List   Diagnosis Date Noted  . Anoxic brain injury (HCC) 11/05/2019  . Dysphagia 11/05/2019  . Physical deconditioning 11/05/2019  . Acute delirium 11/05/2019  . Respiratory failure (HCC)   . Acute metabolic encephalopathy   . Encounter for central line placement   . Cardiac arrest (HCC) 09/23/2019  . Acute combined systolic and diastolic heart failure (HCC) 09/23/2019  . Ventricular tachycardia, polymorphic (HCC) 09/23/2019  . Pelvic pain affecting pregnancy 10/15/2015  . Supervision of normal pregnancy in third trimester 09/28/2015  . Poor weight gain of pregnancy 09/28/2015  . Iron deficiency anemia of pregnancy 09/01/2015  . Increased BMI (body mass index) 07/29/2015   Past Medical History:  Past Medical History:  Diagnosis Date  . Hypertension    last pregnancy  . Vaginal Pap smear, abnormal    when she was 34yo   Past Surgical History:  Past Surgical History:  Procedure Laterality Date  . IR GASTROSTOMY TUBE MOD SED  10/07/2019  . LEFT HEART CATH AND CORONARY ANGIOGRAPHY N/A 09/23/2019   Procedure: LEFT HEART CATH AND CORONARY ANGIOGRAPHY;  Surgeon: Yvonne Kendall, MD;  Location: [facility] INVASIVE CV LAB;  Service: Cardiovascular;  Laterality: N/A;   HPI:  Pt is a 34 y.o. female with PMH of HTN who was admitted 09/23/19 2 weeks post partum after emergent C section due to rupture of membranes with VF arrest. Pt with enterobacter PNA and encephalopathy, trach placed 8/30 and PEG on 8/31. MRI 8/31: No evidence of anoxic brain injury. Pt changed to #4 cuffless trach on 9/15; capped on 9/21 and decannulated on 9/25. MBS 9/22, POs started - primary oral dysphagia.     Assessment / Plan / Recommendation Clinical Impression  Pt was seen for a  cognitive-linguistic evaluation.  She was encountered awake/alert with mother at bedside, but she fatigued quickly during this evaluation.  Mother was able to provide most of the pt's baseline information as the pt exhibited difficulty with recall.   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  She has three children (ages 11, 64, and 2 m.o.), and she was living with her children and boyfriend prior to admission.  Pt had difficulty recalling her children's names and ages, and she benefited from verbal cues from her mother.  Evaluation of expressive and receptive language was limited secondary to fatigue, but pt was observed to communicate in full sentences and she followed simple 1-2 step commands independently.  She was noted to have some pragmatic deficits c/b reduced topic maintenance; however, this may also be attributable to decreased sustained/focused attention.  Recommend additional diagnostic treatment targeting language abilities.  She exhibited mild dysarthria with speech intelligibility approximately 80% at the word level and 70% at the sentence level.  Cognitively, the pt exhibited moderate deficits in short-term/working memory, attention, safety judgement, and numeric problem solving (time/money management).  She benefited from semantic cues during memory tasks and from verbal cues during problem solving tasks.  She was oriented to herself and the time (given a clock), but she was not oriented to place or situation.  Recommend additional ST 5x weekly, high intensity (inpatient rehab) at time of discharge.  SLP will f/u for treatment acutely  per POC.      SLP Assessment  SLP Visit Diagnosis: Dysphagia, oral phase (R13.11)    Follow Up Recommendations  Inpatient Rehab    Frequency and Duration min 2x/week  2 weeks      SLP Evaluation Cognition  Overall Cognitive Status:  Impaired/Different from baseline Arousal/Alertness: Lethargic Orientation Level: Oriented to person;Disoriented to place;Oriented to time;Disoriented to situation Attention: Focused;Sustained Focused Attention: Impaired Focused Attention Impairment: Verbal basic;Functional basic Sustained Attention: Impaired Sustained Attention Impairment: Verbal basic;Functional basic Memory: Impaired Memory Impairment: Decreased short term memory Decreased Short Term Memory: Verbal basic Immediate Memory Recall: Sock;Blue;Bed Memory Recall Sock: With Cue Memory Recall Blue: With Cue Memory Recall Bed: With Cue Awareness: Impaired Awareness Impairment: Emergent impairment Problem Solving: Impaired Problem Solving Impairment: Functional basic;Verbal basic;Verbal complex Safety/Judgment: Impaired       Comprehension  Auditory Comprehension Overall Auditory Comprehension: Appears within functional limits for tasks assessed (limited evaluation secondary to fatigue ) Commands: Within Functional Limits Conversation: Simple Interfering Components: Attention EffectiveTechniques: Extra processing time    Expression Expression Primary Mode of Expression: Verbal Verbal Expression Overall Verbal Expression: Appears within functional limits for tasks assessed Level of Generative/Spontaneous Verbalization: Conversation Naming: No impairment Pragmatics: Impairment Impairments: Topic maintenance Interfering Components: Attention;Speech intelligibility   Oral / Motor  Oral Motor/Sensory Function Overall Oral Motor/Sensory Function: Within functional limits Motor Speech Overall Motor Speech: Impaired Respiration: Within functional limits Phonation: Low vocal intensity Resonance: Within functional limits Articulation: Impaired Level of Impairment: Word Intelligibility: Intelligibility reduced Word: 75-100% accurate Phrase: 75-100% accurate Sentence: 50-74% accurate Conversation: 50-74%  accurate Motor Planning: Witnin functional limits   GO                   Villa Herb., M.S., CCC-SLP Acute Rehabilitation Services Office: [phone]   Shanon Rosser [place] 11/15/2019, 10:39 AM

## 2019-11-16 DIAGNOSIS — G931 Anoxic brain damage, not elsewhere classified: Secondary | ICD-10-CM | POA: Diagnosis not present

## 2019-11-16 LAB — GLUCOSE, CAPILLARY
Glucose-Capillary: 99 mg/dL (ref 70–99)
Glucose-Capillary: 107 mg/dL — ABNORMAL HIGH (ref 70–99)
Glucose-Capillary: 113 mg/dL — ABNORMAL HIGH (ref 70–99)
Glucose-Capillary: 126 mg/dL — ABNORMAL HIGH (ref 70–99)
Glucose-Capillary: 103 mg/dL — ABNORMAL HIGH (ref 70–99)

## 2019-11-16 NOTE — Progress Notes (Signed)
Patient seen and examined, no overt event last night, she is drowsy this morning, not in acute distress, mother and RN at bedside. Awaiting for placement.

## 2019-11-17 DIAGNOSIS — I469 Cardiac arrest, cause unspecified: Secondary | ICD-10-CM | POA: Diagnosis not present

## 2019-11-17 DIAGNOSIS — I5041 Acute combined systolic (congestive) and diastolic (congestive) heart failure: Secondary | ICD-10-CM | POA: Diagnosis not present

## 2019-11-17 DIAGNOSIS — R410 Disorientation, unspecified: Secondary | ICD-10-CM | POA: Diagnosis not present

## 2019-11-17 DIAGNOSIS — G931 Anoxic brain damage, not elsewhere classified: Secondary | ICD-10-CM | POA: Diagnosis not present

## 2019-11-17 LAB — GLUCOSE, CAPILLARY
Glucose-Capillary: 108 mg/dL — ABNORMAL HIGH (ref 70–99)
Glucose-Capillary: 112 mg/dL — ABNORMAL HIGH (ref 70–99)
Glucose-Capillary: 117 mg/dL — ABNORMAL HIGH (ref 70–99)
Glucose-Capillary: 119 mg/dL — ABNORMAL HIGH (ref 70–99)
Glucose-Capillary: 122 mg/dL — ABNORMAL HIGH (ref 70–99)
Glucose-Capillary: 126 mg/dL — ABNORMAL HIGH (ref 70–99)

## 2019-11-17 NOTE — Progress Notes (Signed)
Physical Therapy Treatment Patient Details Name: Beverly [name] MRN: [id number] DOB: [birth year] Today's Date: 11/17/2019    History of Present Illness Pt is a 34 y.o. female admitted 09/23/19 with intermittent chest discomfort; of note, pt 2-weeks post-partum (had emergent C-section); while in ED, pt with VF cardiac arrest requiring intubation. Brain MRI 8/23 with no acute abnormality. S/p trach and PEG tube 8/30. Course complicated by encephalopathy, anoxic brain injury, enterobacterial PNA. Transferred out of ICU On 9/4. PMH includes HTN.    PT Comments    Pt continues to make steady progress. Continues to have significant impairment with all mobility and cognition.    Follow Up Recommendations  CIR;Supervision/Assistance - 24 hour     Equipment Recommendations  Wheelchair (measurements PT);Hospital bed    Recommendations for Other Services       Precautions / Restrictions Precautions Precautions: Fall;Other (comment) Precaution Comments: impulsive, incontinence    Mobility  Bed Mobility Overal bed mobility: Needs Assistance Bed Mobility: Supine to Sit;Sit to Supine     Supine to sit: Min guard Sit to supine: Min guard   General bed mobility comments: Assist for safety but no physical assist  Transfers Overall transfer level: Needs assistance Equipment used: 2 person hand held assist Transfers: Sit to/from Stand Sit to Stand: Mod assist;+2 physical assistance         General transfer comment: Assist to bring hips up and for trunk control  Ambulation/Gait Ambulation/Gait assistance: Mod assist;+2 physical assistance Gait Distance (Feet): 115 Feet (115' x 1, 50' x 1, 60' x 1) Assistive device: 2 person hand held assist Gait Pattern/deviations: Decreased step length - right;Decreased step length - left;Leaning posteriorly;Trunk flexed;Wide base of support;Staggering left;Staggering right;Step-through pattern Gait velocity: decr Gait velocity interpretation: <1.8  ft/sec, indicate of risk for recurrent falls General Gait Details: Significant assist to control trunk with incr sway primarily anteriorly this session but all other directions at times. Pt with decr lt foot clearance as pt fatigues. Pt easily distracted resulting in decr balance as well. Pt impulsive and needs frequent verbal and tactile cues for safety.    Stairs             Wheelchair Mobility    Modified Rankin (Stroke Patients Only)       Balance Overall balance assessment: Needs assistance Sitting-balance support: No upper extremity supported;Feet supported Sitting balance-Leahy Scale: Poor Sitting balance - Comments: Sits EOB min assist due to intermittent posterior lean Postural control: Posterior lean Standing balance support: Bilateral upper extremity supported Standing balance-Leahy Scale: Poor Standing balance comment: min-modA x2 due to increased sway                            Cognition Arousal/Alertness: Awake/alert Behavior During Therapy: Impulsive Overall Cognitive Status: Impaired/Different from baseline Area of Impairment: Attention;Memory;Safety/judgement;Following commands;Awareness;Problem solving               Rancho Levels of Cognitive Functioning Rancho Los Amigos Scales of Cognitive Functioning: Confused/inappropriate/non-agitated   Current Attention Level: Sustained Memory: Decreased recall of precautions;Decreased short-term memory Following Commands: Follows one step commands with increased time Safety/Judgement: Decreased awareness of safety;Decreased awareness of deficits Awareness: Intellectual Problem Solving: Slow processing;Difficulty sequencing;Requires verbal cues;Requires tactile cues        Exercises      General Comments General comments (skin integrity, edema, etc.): VSS on RA      Pertinent Vitals/Pain Pain Assessment: No/denies pain    Home Living  Prior Function             PT Goals (current goals can now be found in the care plan section) Acute Rehab PT Goals Patient Stated Goal: to improve mobility quality Progress towards PT goals: Progressing toward goals    Frequency    Min 3X/week      PT Plan Current plan remains appropriate    Co-evaluation              AM-PAC PT "6 Clicks" Mobility   Outcome Measure  Help needed turning from your back to your side while in a flat bed without using bedrails?: A Little Help needed moving from lying on your back to sitting on the side of a flat bed without using bedrails?: A Little Help needed moving to and from a bed to a chair (including a wheelchair)?: A Lot Help needed standing up from a chair using your arms (e.g., wheelchair or bedside chair)?: A Lot Help needed to walk in hospital room?: A Lot Help needed climbing 3-5 steps with a railing? : Total 6 Click Score: 13    End of Session Equipment Utilized During Treatment: Gait belt Activity Tolerance: Patient tolerated treatment well Patient left: in bed;with call bell/phone within reach;with bed alarm set;with family/visitor present Nurse Communication: Mobility status PT Visit Diagnosis: Other abnormalities of gait and mobility (R26.89);Difficulty in walking, not elsewhere classified (R26.2);Other symptoms and signs involving the nervous system (R29.898)     Time: 8841-6606 PT Time Calculation (min) (ACUTE ONLY): 26 min  Charges:  $Gait Training: 23-37 mins                     [facility] PT Acute Rehabilitation Services Pager [phone] Office [phone]    Beverly [name] [facility] 11/17/2019, 1:30 PM

## 2019-11-17 NOTE — TOC Progression Note (Signed)
Transition of Care (TOC) - Progression Note  Donn Pierini RN, BSN Transitions of Care Unit 4E- RN Case Manager See Treatment Team for direct phone #    Patient Details  Name: Beverly [name] MRN: [id number] Date of Birth: 10/11/1985  Transition of Care [facility]) CM/SW Contact  Zenda Alpers Lenn Sink, RN Phone Number: 11/17/2019, 3:19 PM  Clinical Narrative:    Call placed to both [facility] INPT rehab and [facility] INPT rehab to f/u on potential bed offers at either facility.  Mom also provided a list of STSNF options along with bed offers at this point in time should INPT rehab not work out or should she select not to pursue the INPT rehab in [place]. Will await return call from both INPT rehabs and f/u with mom tomorrow for choice.    Expected Discharge Plan: IP Rehab Facility Barriers to Discharge: Continued Medical Work up, SNF Pending Medicaid, SNF Pending bed offer  Expected Discharge Plan and Services Expected Discharge Plan: IP Rehab Facility In-house Referral: Clinical Social Work   Post Acute Care Choice: IP Rehab                                         Social Determinants of Health (SDOH) Interventions    Readmission Risk Interventions No flowsheet data found.

## 2019-11-17 NOTE — Progress Notes (Signed)
Mobility Specialist - Progress Note   11/17/19 1627  Mobility  Activity  (Cancel)   Pt currently w/ SLP.  Mamie Levers Mobility Specialist Mobility Specialist Phone: [phone]

## 2019-11-17 NOTE — Progress Notes (Addendum)
  Speech Language Pathology Treatment: Dysphagia;Cognitive-Linquistic  Patient Details Name: Beverly [name] MRN: [id number] DOB: [birth year] Today's Date: 11/17/2019 Time: 6269-4854 SLP Time Calculation (min) (ACUTE ONLY): 33 min  Assessment / Plan / Recommendation Clinical Impression  Pt was seen for treatment with her mother present. Pt was alert and verbal output was notably increased compared to when she was last seen by this SLP. She demonstrated 100% accuracy with confrontational naming and picture description. She was able to accurately provide her name but required cues for date of birth and age. In the absence of cueing, pt stated that she was born in [birth year] and ages provided included 54, 70, and 40. Pt was oriented to time with verbal prompts for reasoning. Cueing was required for attention as well as short-term and recent memory throughout the session. She was educated regarding compensatory strategies for speech intelligibility and used these strategies at the phrase level with 60% accuracy increasing to 100% with verbal prompts and models for reduced speaking rate and overarticulation. Clarification was frequently needed for speech intelligibility. Pt tolerated dysphagia 3 and regular texture solids without overt s/sx of aspiration or significant oral residue. [name] indicated that she was concerned about the pt "choking" when the pt was on dysphagia 3 since she was pocketing. She indicated that she believes the pt is only recently improving with her tolerance of dysphagia 2 and would therefore like for further advancement of her diet to be deferred. SLP will continue to follow pt.    HPI HPI: Pt is a 34 y.o. female with PMH of HTN who was admitted 09/23/19 2 weeks post partum after emergent C section due to rupture of membranes with VF arrest. Pt with enterobacter PNA and encephalopathy, trach placed 8/30 and PEG on 8/31. MRI 8/31: No evidence of anoxic brain injury. Pt changed to #4  cuffless trach on 9/15; capped on 9/21 and decannulated on 9/25. MBS 9/22, POs started - primary oral dysphagia.        SLP Plan  Continue with current plan of care       Recommendations  Diet recommendations: Dysphagia 2 (fine chop);Thin liquid Liquids provided via: Cup;Teaspoon;Straw Medication Administration: Crushed with puree Supervision: Staff to assist with self feeding;Full supervision/cueing for compensatory strategies Compensations: Slow rate;Small sips/bites;Minimize environmental distractions;Follow solids with liquid Postural Changes and/or Swallow Maneuvers: Seated upright 90 degrees                Oral Care Recommendations: Oral care BID;Staff/trained caregiver to provide oral care Follow up Recommendations: Inpatient Rehab SLP Visit Diagnosis: Dysphagia, oral phase (R13.11) Plan: Continue with current plan of care       Lakashia Collison I. Vear Clock, MS, CCC-SLP Acute Rehabilitation Services Office number [phone] Pager [phone]                [name] 11/17/2019, 5:10 PM

## 2019-11-17 NOTE — Progress Notes (Signed)
PROGRESS NOTE    Beverly [name]  VZD:[id number] DOB: 11/14/1985 DOA: 09/24/2019 PCP: Patient, No Pcp Per    Brief Narrative:  Patient is in the hospital for almost 2 months, please see hospital course for details.  Currently stabilized and waiting for rehab at a SNF.   Assessment & Plan:   Active Problems:   Cardiac arrest (HCC)   Acute combined systolic and diastolic heart failure (HCC)   Ventricular tachycardia, polymorphic (HCC)   Encounter for central line placement   Respiratory failure (HCC)   Acute metabolic encephalopathy   Anoxic brain injury (HCC)   Dysphagia   Physical deconditioning   Acute delirium  V. fib arrest due to anterior MI Acute systolic congestive heart failure with ejection fraction 25% secondary to ischemic cardiomyopathy Prediabetes Acute metabolic encephalopathy/acute delirium Dysphagia Profound physical deconditioning Electrolyte abnormalities  Plan: Patient is adequately stabilized.  She is on all supportive treatment and aggressive rehab.  Patient is optimized on ischemic medications, CHF therapy.  She is eating by mouth. Discharge planning in process.  Can be transferred to a skilled nursing level of care whenever available.   DVT prophylaxis: enoxaparin (LOVENOX) injection 40 mg Start: 10/07/19 1000 Place and maintain sequential compression device Start: 09/24/19 0136   Code Status: Full code Family Communication: Mother at the bedside Disposition Plan: Status is: Inpatient  Remains inpatient appropriate because:Unsafe d/c plan   Dispo: The patient is from: Home              Anticipated d/c is to: SNF              Anticipated d/c date is: 1 day              Patient currently is medically stable to d/c.         Consultants:   Multiple  Procedures:   Multiple  Antimicrobials:   Multiple, finished therapies   Subjective: Patient seen and examined.  No overnight events.  Mom was at the bedside.  There was some  problem ordering her breakfast and she was hungry. Mother is considering discussing with social worker about placement in [place].  Objective: Vitals:   11/16/19 2024 11/17/19 0020 11/17/19 0422 11/17/19 0811  BP: 120/70 112/67 102/66 124/77  Pulse: 87 89 78 (!) 103  Resp: 16 16 17 18   Temp: 98.5 F (36.9 C) 98.5 F (36.9 C) 98.4 F (36.9 C) 98.4 F (36.9 C)  TempSrc: Oral Oral Oral Oral  SpO2: 98% 99% 100% 100%  Weight:   89.1 kg   Height:        Intake/Output Summary (Last 24 hours) at 11/17/2019 1037 Last data filed at 11/16/2019 1800 Gross per 24 hour  Intake 710 ml  Output --  Net 710 ml   Filed Weights   11/14/19 0530 11/16/19 0436 11/17/19 0422  Weight: 86.9 kg 86 kg 89.1 kg    Examination:  General exam: Appears calm and comfortable.  On room air.  Looks pleasant with good sense of humor. Respiratory system: Clear to auscultation. Respiratory effort normal.  No added sound. Cardiovascular system: S1 & S2 heard, RRR.  Gastrointestinal system: Nontender.  G-tube in place. Central nervous system: Alert and oriented.  Patient has quadriparesis, 3 x 5 distal muscle groups.   Data Reviewed: I have personally reviewed following labs and imaging studies  CBC: Recent Labs  Lab 11/12/19 2205  WBC 8.2  NEUTROABS 4.4  HGB 10.6*  HCT 34.8*  MCV 83.5  PLT  559*   Basic Metabolic Panel: Recent Labs  Lab 11/12/19 2205 11/13/19 0240  NA 138 138  K 4.4 4.1  CL 102 103  CO2 24 24  GLUCOSE 100* 103*  BUN 13 13  CREATININE 0.56 0.54  CALCIUM 9.9 9.6  MG 2.0  --    GFR: Estimated Creatinine Clearance: 122.2 mL/min (by C-G formula based on SCr of 0.54 mg/dL). Liver Function Tests: Recent Labs  Lab 11/12/19 2205  AST 31  ALT 35  ALKPHOS 104  BILITOT 0.2*  PROT 7.9  ALBUMIN 3.5   No results for input(s): LIPASE, AMYLASE in the last 168 hours. No results for input(s): AMMONIA in the last 168 hours. Coagulation Profile: No results for input(s): INR,  PROTIME in the last 168 hours. Cardiac Enzymes: No results for input(s): CKTOTAL, CKMB, CKMBINDEX, TROPONINI in the last 168 hours. BNP (last 3 results) No results for input(s): PROBNP in the last 8760 hours. HbA1C: No results for input(s): HGBA1C in the last 72 hours. CBG: Recent Labs  Lab 11/16/19 1631 11/16/19 2024 11/17/19 0022 11/17/19 0420 11/17/19 0806  GLUCAP 126* 103* 108* 117* 122*   Lipid Profile: No results for input(s): CHOL, HDL, LDLCALC, TRIG, CHOLHDL, LDLDIRECT in the last 72 hours. Thyroid Function Tests: No results for input(s): TSH, T4TOTAL, FREET4, T3FREE, THYROIDAB in the last 72 hours. Anemia Panel: No results for input(s): VITAMINB12, FOLATE, FERRITIN, TIBC, IRON, RETICCTPCT in the last 72 hours. Sepsis Labs: No results for input(s): PROCALCITON, LATICACIDVEN in the last 168 hours.  No results found for this or any previous visit (from the past 240 hour(s)).       Radiology Studies: No results found.      Scheduled Meds: . aspirin  81 mg Per Tube Daily  . atorvastatin  80 mg Per Tube Daily  . carvedilol  6.25 mg Per Tube BID WC  . dapagliflozin propanediol  5 mg Oral Daily  . digoxin  0.125 mg Per Tube Daily  . enoxaparin (LOVENOX) injection  40 mg Subcutaneous Q24H  . feeding supplement (ENSURE ENLIVE)  237 mL Oral Q24H  . feeding supplement (OSMOLITE 1.5 CAL)  237 mL Per Tube TID  . feeding supplement (PROSource TF)  45 mL Per Tube TID  . hydrOXYzine  10 mg Per Tube BID  . influenza vac split quadrivalent PF  0.5 mL Intramuscular Tomorrow-1000  . insulin aspart  0-15 Units Subcutaneous Q4H  . metaxalone  400 mg Oral TID  . OXcarbazepine  75 mg Per Tube BID  . prenatal multivitamin  1 tablet Per Tube Q1200  . sacubitril-valsartan  1 tablet Per Tube BID  . spironolactone  25 mg Per Tube Daily   Continuous Infusions: . sodium chloride Stopped (11/06/19 1900)     LOS: 54 days    Time spent: 20 minutes    Dorcas Carrow,  MD [facility] Pager [phone]

## 2019-11-18 DIAGNOSIS — I469 Cardiac arrest, cause unspecified: Secondary | ICD-10-CM | POA: Diagnosis not present

## 2019-11-18 DIAGNOSIS — G931 Anoxic brain damage, not elsewhere classified: Secondary | ICD-10-CM | POA: Diagnosis not present

## 2019-11-18 DIAGNOSIS — R410 Disorientation, unspecified: Secondary | ICD-10-CM | POA: Diagnosis not present

## 2019-11-18 DIAGNOSIS — I5041 Acute combined systolic (congestive) and diastolic (congestive) heart failure: Secondary | ICD-10-CM | POA: Diagnosis not present

## 2019-11-18 LAB — GLUCOSE, CAPILLARY
Glucose-Capillary: 101 mg/dL — ABNORMAL HIGH (ref 70–99)
Glucose-Capillary: 104 mg/dL — ABNORMAL HIGH (ref 70–99)
Glucose-Capillary: 106 mg/dL — ABNORMAL HIGH (ref 70–99)
Glucose-Capillary: 111 mg/dL — ABNORMAL HIGH (ref 70–99)
Glucose-Capillary: 117 mg/dL — ABNORMAL HIGH (ref 70–99)
Glucose-Capillary: 95 mg/dL (ref 70–99)
Glucose-Capillary: 96 mg/dL (ref 70–99)

## 2019-11-18 MED ORDER — ENSURE ENLIVE PO LIQD
237.0000 mL | Freq: Two times a day (BID) | ORAL | Status: DC
  Administered 2019-11-19 – 2019-11-28 (×16): 237 mL via ORAL

## 2019-11-18 NOTE — Progress Notes (Signed)
Occupational Therapy Treatment Patient Details Name: Beverly [name] MRN: [id number] DOB: 1985/12/17 Today's Date: 11/18/2019    History of present illness Pt is a 34 y.o. female admitted 09/23/19 with intermittent chest discomfort; of note, pt 2-weeks post-partum (had emergent C-section); while in ED, pt with VF cardiac arrest requiring intubation. Brain MRI 8/23 with no acute abnormality. S/p trach and PEG tube 8/30. Course complicated by encephalopathy, anoxic brain injury, enterobacterial PNA. Transferred out of ICU On 9/4. PMH includes HTN.   OT comments  Pt. Was seen to increase ability to perform ADLs. Pt. Was very lethargic today and required extensive assist with grooming in supine. Pt. Required PROM for B UE for all joints. Ed pt. Mother on b UE HEP and will need further training.   Follow Up Recommendations  CIR    Equipment Recommendations  Wheelchair (measurements OT);Wheelchair cushion (measurements OT);Hospital bed    Recommendations for Other Services Rehab consult    Precautions / Restrictions Precautions Precautions: Fall;Other (comment) Precaution Comments: impulsive, incontinence Restrictions Weight Bearing Restrictions: No       Mobility Bed Mobility                  Transfers                      Balance                                           ADL either performed or assessed with clinical judgement   ADL       Grooming: Wash/dry face;Wash/dry hands;Bed level;Total assistance                                 General ADL Comments: Pt. very lethargic and difficult to arouse today.      Vision       Perception     Praxis      Cognition Arousal/Alertness: Lethargic   Overall Cognitive Status: Impaired/Different from baseline Area of Impairment: Attention;Memory;Safety/judgement;Following commands;Awareness;Problem solving                 Orientation Level: Disoriented  to;Place;Time;Situation     Following Commands: Follows one step commands inconsistently                Exercises General Exercises - Upper Extremity Shoulder Flexion: PROM;Both;20 reps;Supine Shoulder Extension: PROM;20 reps;Both;Supine Shoulder ABduction: PROM;Both;20 reps;Supine Shoulder ADduction: PROM;Both;20 reps;Supine Shoulder Horizontal ABduction: PROM;Both;20 reps;Supine Shoulder Horizontal ADduction: PROM;Both;20 reps;Supine Elbow Flexion: PROM;Both;20 reps;Supine Elbow Extension: PROM;Both;20 reps;Supine Wrist Flexion: PROM;Both;20 reps;Supine Wrist Extension: PROM;Both;20 reps;Supine Digit Composite Flexion: PROM;Both;20 reps;Supine Composite Extension: PROM;Both;15 reps;Supine   Shoulder Instructions       General Comments      Pertinent Vitals/ Pain       Pain Assessment: No/denies pain  Home Living                                          Prior Functioning/Environment              Frequency  Min 2X/week        Progress Toward Goals  OT Goals(current goals can now be found in the care plan section)  Progress towards  OT goals: Progressing toward goals  Acute Rehab OT Goals Patient Stated Goal: to improve mobility quality OT Goal Formulation: With family Time For Goal Achievement: 11/20/19 Potential to Achieve Goals: Fair ADL Goals Pt Will Perform Grooming: with max assist;sitting Pt/caregiver will Perform Home Exercise Program: Increased strength;Increased ROM;Both right and left upper extremity;With minimal assist;With written HEP provided Additional ADL Goal #1: Pt will follow 1 step command with 25% accuracy. Additional ADL Goal #2: Pt will visually track/attend to meaningful item with max cues.  Plan Frequency remains appropriate;Discharge plan remains appropriate    Co-evaluation                 AM-PAC OT "6 Clicks" Daily Activity     Outcome Measure   Help from another person eating meals?:  Total Help from another person taking care of personal grooming?: Total Help from another person toileting, which includes using toliet, bedpan, or urinal?: Total Help from another person bathing (including washing, rinsing, drying)?: Total Help from another person to put on and taking off regular upper body clothing?: Total Help from another person to put on and taking off regular lower body clothing?: Total 6 Click Score: 6    End of Session    OT Visit Diagnosis: Other symptoms and signs involving cognitive function;Muscle weakness (generalized) (M62.81)   Activity Tolerance Patient limited by lethargy   Patient Left in bed;with call bell/phone within reach;with bed alarm set;with family/visitor present   Nurse Communication  (ok therapy)        Time: 1610-9604 OT Time Calculation (min): 23 min  Charges: OT General Charges $OT Visit: 1 Visit OT Treatments $Self Care/Home Management : 8-22 mins $Therapeutic Exercise: 8-22 mins  Derrek Gu OT/L    Shlome Baldree 11/18/2019, 1:09 PM

## 2019-11-18 NOTE — Progress Notes (Signed)
PROGRESS NOTE    Beverly [name]  HAL:[id number] DOB: [birth year] DOA: 09/24/2019 PCP: Patient, No Pcp Per    Brief Narrative:  Patient is in the hospital for almost 2 months, please see hospital course for details.  Currently stabilized and waiting for rehab at a SNF or CIR   Assessment & Plan:   Active Problems:   Cardiac arrest (HCC)   Acute combined systolic and diastolic heart failure (HCC)   Ventricular tachycardia, polymorphic (HCC)   Encounter for central line placement   Respiratory failure (HCC)   Acute metabolic encephalopathy   Anoxic brain injury (HCC)   Dysphagia   Physical deconditioning   Acute delirium  V. fib arrest due to anterior MI Acute systolic congestive heart failure with ejection fraction 25% secondary to ischemic cardiomyopathy Prediabetes Acute metabolic encephalopathy/acute delirium Dysphagia Profound physical deconditioning Electrolyte abnormalities  Plan: Patient is adequately stabilized.  She is on all supportive treatment and aggressive rehab.  Patient is optimized on ischemic medications, CHF therapy.  She is eating by mouth. Discharge planning in process.   Patient can be transferred to a skilled nursing rehab or inpatient rehab whenever bed available.   DVT prophylaxis: enoxaparin (LOVENOX) injection 40 mg Start: 10/07/19 1000 Place and maintain sequential compression device Start: 09/24/19 0136   Code Status: Full code Family Communication: Mother at the bedside Disposition Plan: Status is: Inpatient  Remains inpatient appropriate because:Unsafe d/c plan   Dispo: The patient is from: Home              Anticipated d/c is to: SNF              Anticipated d/c date is: Whenever bed available.              Patient currently is medically stable to d/c.         Consultants:   Multiple  Procedures:   Multiple  Antimicrobials:   Multiple, finished therapies   Subjective: Seen and examined.  No overnight events.   Sleepy in the morning, later woke up and eating with help of mom.  Objective: Vitals:   11/17/19 2016 11/18/19 0500 11/18/19 0507 11/18/19 0842  BP: 117/69  105/74 108/72  Pulse: 95  96 96  Resp: 20  20 16   Temp: 98.4 F (36.9 C)  98.4 F (36.9 C) 98.4 F (36.9 C)  TempSrc: Oral  Oral Oral  SpO2: 100%  98% 100%  Weight:  86.9 kg    Height:        Intake/Output Summary (Last 24 hours) at 11/18/2019 1121 Last data filed at 11/17/2019 2103 Gross per 24 hour  Intake 797 ml  Output --  Net 797 ml   Filed Weights   11/16/19 0436 11/17/19 0422 11/18/19 0500  Weight: 86 kg 89.1 kg 86.9 kg    Examination:  General exam: Appears calm and comfortable.  On room air.  Pleasant on conversation. Respiratory system: Clear to auscultation. Respiratory effort normal.  No added sound. Cardiovascular system: S1 & S2 heard, RRR.  Gastrointestinal system: Nontender.  G-tube in place. Central nervous system: Alert and oriented.  Patient has quadriparesis, 3 x 5 distal muscle groups.   Data Reviewed: I have personally reviewed following labs and imaging studies  CBC: Recent Labs  Lab 11/12/19 2205  WBC 8.2  NEUTROABS 4.4  HGB 10.6*  HCT 34.8*  MCV 83.5  PLT 559*   Basic Metabolic Panel: Recent Labs  Lab 11/12/19 2205 11/13/19 0240  NA 138  138  K 4.4 4.1  CL 102 103  CO2 24 24  GLUCOSE 100* 103*  BUN 13 13  CREATININE 0.56 0.54  CALCIUM 9.9 9.6  MG 2.0  --    GFR: Estimated Creatinine Clearance: 121 mL/min (by C-G formula based on SCr of 0.54 mg/dL). Liver Function Tests: Recent Labs  Lab 11/12/19 2205  AST 31  ALT 35  ALKPHOS 104  BILITOT 0.2*  PROT 7.9  ALBUMIN 3.5   No results for input(s): LIPASE, AMYLASE in the last 168 hours. No results for input(s): AMMONIA in the last 168 hours. Coagulation Profile: No results for input(s): INR, PROTIME in the last 168 hours. Cardiac Enzymes: No results for input(s): CKTOTAL, CKMB, CKMBINDEX, TROPONINI in the last  168 hours. BNP (last 3 results) No results for input(s): PROBNP in the last 8760 hours. HbA1C: No results for input(s): HGBA1C in the last 72 hours. CBG: Recent Labs  Lab 11/17/19 1631 11/17/19 2018 11/18/19 0018 11/18/19 0511 11/18/19 0848  GLUCAP 119* 112* 95 111* 101*   Lipid Profile: No results for input(s): CHOL, HDL, LDLCALC, TRIG, CHOLHDL, LDLDIRECT in the last 72 hours. Thyroid Function Tests: No results for input(s): TSH, T4TOTAL, FREET4, T3FREE, THYROIDAB in the last 72 hours. Anemia Panel: No results for input(s): VITAMINB12, FOLATE, FERRITIN, TIBC, IRON, RETICCTPCT in the last 72 hours. Sepsis Labs: No results for input(s): PROCALCITON, LATICACIDVEN in the last 168 hours.  No results found for this or any previous visit (from the past 240 hour(s)).       Radiology Studies: No results found.      Scheduled Meds: . aspirin  81 mg Per Tube Daily  . atorvastatin  80 mg Per Tube Daily  . carvedilol  6.25 mg Per Tube BID WC  . dapagliflozin propanediol  5 mg Oral Daily  . digoxin  0.125 mg Per Tube Daily  . enoxaparin (LOVENOX) injection  40 mg Subcutaneous Q24H  . feeding supplement (ENSURE ENLIVE)  237 mL Oral Q24H  . feeding supplement (OSMOLITE 1.5 CAL)  237 mL Per Tube TID  . feeding supplement (PROSource TF)  45 mL Per Tube TID  . hydrOXYzine  10 mg Per Tube BID  . influenza vac split quadrivalent PF  0.5 mL Intramuscular Tomorrow-1000  . insulin aspart  0-15 Units Subcutaneous Q4H  . metaxalone  400 mg Oral TID  . OXcarbazepine  75 mg Per Tube BID  . prenatal multivitamin  1 tablet Per Tube Q1200  . sacubitril-valsartan  1 tablet Per Tube BID  . spironolactone  25 mg Per Tube Daily   Continuous Infusions: . sodium chloride Stopped (11/06/19 1900)     LOS: 55 days    Time spent: 20 minutes    Dorcas Carrow, MD [facility] Pager [phone]

## 2019-11-18 NOTE — Progress Notes (Signed)
Mobility Specialist - Progress Note   11/18/19 1621  Mobility  Activity Ambulated in hall  Level of Assistance +2 (takes two people)  Assistive Device Wheelchair  Distance Ambulated (ft) 240 ft (Intervals: 40 ft x 3, 120 ft x 1)  Mobility Response Tolerated fair  Mobility performed by Mobility specialist;Nurse;Family member  $Mobility charge 1 Mobility    She ambulated w/ 2 person hand-held assist and chair follow from her mother. HR between 120s-140s during ambulation. Pt was very irritable and impulsive during this walk. Seated rest breaks in intervals above due to fatigue. Pt back in bed w/ mother in room.    Mamie Levers Mobility Specialist Mobility Specialist Phone: [phone]

## 2019-11-18 NOTE — Progress Notes (Addendum)
Nutrition Follow-up  DOCUMENTATION CODES:   Not applicable  INTERVENTION:   Continue free water flushes for PEG tube maintenance    Discontinue bolus feedings to see if PO intake increases as patient is complaining of fullness (may need to restart if intake does progress)   Encourage at least two Ensure Enlives daily each supplement provides 350 kcal and 20 grams of protein  NUTRITION DIAGNOSIS:   Increased nutrient needs related to post-op healing as evidenced by estimated needs.  Ongoing  GOAL:   Patient will meet greater than or equal to 90% of their needs  Addressed via TF  MONITOR:   Vent status, Skin, TF tolerance, I & O's, Labs, Weight trends  REASON FOR ASSESSMENT:   Ventilator   ASSESSMENT:   Patient with PMH significant for HTN and 2 weeks post partum emergency C-section. Presents this admission with new onset peripartum cardiomyopathy with VF arrest.   8/30- trach, PEG 9/4- on ATC 9/20- trach downsized, capped 9/25- trach removed   Appetite slowly improving. Meal completions charted as 100%, 75%, 50%, 50%, 50%, and 50%. Pt taking sips of Ensure. Per SLP pt tolerating DYS 3 diet but mother concerned pt might choke as pt was pocketing food. Diet downgraded to DYS2 and further advancement will be deferred for now per mother's wishes.   Pt complains of fullness after eating and sometimes with tube feeding. Feels if tube feeding were to be held she could possibly eat more. Will hold for now and see if intake improves.   Admission weight: 103.1 kg  Current weight: 86.9 kg (stable over last week)  Medications: SS novolog, prenatal vitamin, aldactone Labs: CBG 95-126  Diet Order:   Diet Order            DIET DYS 2 Room service appropriate? Yes with Assist; Fluid consistency: Thin  Diet effective now                 EDUCATION NEEDS:   Not appropriate for education at this time  Skin:  Skin Assessment: Skin Integrity Issues: Skin Integrity  Issues:: Incisions Incisions: neck  Last BM:  10/11  Height:   Ht Readings from Last 1 Encounters:  09/24/19 5\' 10"  (1.778 m)    Weight:   Wt Readings from Last 1 Encounters:  11/18/19 86.9 kg    BMI:  Body mass index is 27.49 kg/m.  Estimated Nutritional Needs:   Kcal:  2150-2350 kcal  Protein:  115-130 grams  Fluid:  >/= 2 L/day   01/18/20 RD, LDN Clinical Nutrition Pager listed in [facility]

## 2019-11-19 DIAGNOSIS — I469 Cardiac arrest, cause unspecified: Secondary | ICD-10-CM | POA: Diagnosis not present

## 2019-11-19 DIAGNOSIS — G931 Anoxic brain damage, not elsewhere classified: Secondary | ICD-10-CM | POA: Diagnosis not present

## 2019-11-19 DIAGNOSIS — R5381 Other malaise: Secondary | ICD-10-CM | POA: Diagnosis not present

## 2019-11-19 LAB — CBC WITH DIFFERENTIAL/PLATELET
Abs Immature Granulocytes: 0.08 10*3/uL — ABNORMAL HIGH (ref 0.00–0.07)
Basophils Absolute: 0 10*3/uL (ref 0.0–0.1)
Basophils Relative: 0 %
Eosinophils Absolute: 0.1 10*3/uL (ref 0.0–0.5)
Eosinophils Relative: 1 %
HCT: 37.6 % (ref 36.0–46.0)
Hemoglobin: 11.6 g/dL — ABNORMAL LOW (ref 12.0–15.0)
Immature Granulocytes: 1 %
Lymphocytes Relative: 35 %
Lymphs Abs: 3.4 10*3/uL (ref 0.7–4.0)
MCH: 25.4 pg — ABNORMAL LOW (ref 26.0–34.0)
MCHC: 30.9 g/dL (ref 30.0–36.0)
MCV: 82.5 fL (ref 80.0–100.0)
Monocytes Absolute: 0.7 10*3/uL (ref 0.1–1.0)
Monocytes Relative: 7 %
Neutro Abs: 5.4 10*3/uL (ref 1.7–7.7)
Neutrophils Relative %: 56 %
Platelets: 646 10*3/uL — ABNORMAL HIGH (ref 150–400)
RBC: 4.56 MIL/uL (ref 3.87–5.11)
RDW: 18.2 % — ABNORMAL HIGH (ref 11.5–15.5)
WBC: 9.7 10*3/uL (ref 4.0–10.5)
nRBC: 0 % (ref 0.0–0.2)

## 2019-11-19 LAB — GLUCOSE, CAPILLARY
Glucose-Capillary: 104 mg/dL — ABNORMAL HIGH (ref 70–99)
Glucose-Capillary: 110 mg/dL — ABNORMAL HIGH (ref 70–99)
Glucose-Capillary: 98 mg/dL (ref 70–99)
Glucose-Capillary: 122 mg/dL — ABNORMAL HIGH (ref 70–99)
Glucose-Capillary: 126 mg/dL — ABNORMAL HIGH (ref 70–99)
Glucose-Capillary: 90 mg/dL (ref 70–99)

## 2019-11-19 LAB — BASIC METABOLIC PANEL
Anion gap: 10 (ref 5–15)
BUN: 11 mg/dL (ref 6–20)
CO2: 24 mmol/L (ref 22–32)
Calcium: 10.1 mg/dL (ref 8.9–10.3)
Chloride: 105 mmol/L (ref 98–111)
Creatinine, Ser: 0.61 mg/dL (ref 0.44–1.00)
GFR, Estimated: >60 mL/min (ref >60)
Glucose, Bld: 110 mg/dL — ABNORMAL HIGH (ref 70–99)
Potassium: 3.9 mmol/L (ref 3.5–5.1)
Sodium: 139 mmol/L (ref 135–145)

## 2019-11-19 LAB — PHOSPHORUS: Phosphorus: 5.3 mg/dL — ABNORMAL HIGH (ref 2.5–4.6)

## 2019-11-19 LAB — MAGNESIUM: Magnesium: 2 mg/dL (ref 1.7–2.4)

## 2019-11-19 MED ORDER — HYDROXYZINE HCL 50 MG/ML IM SOLN
25.0000 mg | Freq: Four times a day (QID) | INTRAMUSCULAR | Status: DC | PRN
  Administered 2019-11-20 (×2): 25 mg via INTRAMUSCULAR
  Filled 2019-11-19 (×3): qty 0.5

## 2019-11-19 NOTE — Progress Notes (Addendum)
  Speech Language Pathology Treatment: Dysphagia;Cognitive-Linquistic  Patient Details Name: Beverly [name] MRN: [id number] DOB: 1985/05/13 Today's Date: 11/19/2019 Time: 9211-9417 SLP Time Calculation (min) (ACUTE ONLY): 36 min  Assessment / Plan / Recommendation Clinical Impression  Pt was seen for treatment with her mother present. [name] expressed that she is torn regarding pt being in [place] and receiving more intense therapy vs her being nearby and being in a SNF. [name] was educated regarding the benefits of more intensive therapy as it relates to neurocognitive improvement. She verbalized understanding and agreement, but stated that she just wants to make sure she is okay. Pt was more communicative during this session and demonstrated improved awareness of impairments. She stated to her mother , "You know I'm not always going to be like this; I'm getting better." and her mother indicated that the pt expressed to her earlier that her many thought are confusing. Cueing was needed for use of compensatory strategies for speech intelligibility was self-correction was intermittently noted for reduced rate. Pt was able to recall some events from during the day and provide the correct year. With cueing she provided the accurate month but was unable to recall it subsequently. She achieved 67% accuracy with problem solving related to safety when verbal prompts were given. She participated in a sequencing task with frequent cueing for intermediate steps and details. Additional cognitive-linguistic tasks were deferred due to pt's c/o fatigue. She tolerated regular texture solids without overt s/sx of aspiration. However, cueing was needed for attention and to masticate instead of speaking. Mild oral residue was noted and was cleared with a liquid wash. It is recommended that her diet be advanced to dysphagia 3 solids and pt's mother is now in agreement with this. SLP will continue to follow pt.     HPI HPI: Pt is a 34 y.o. female with PMH of HTN who was admitted 09/23/19 2 weeks post partum after emergent C section due to rupture of membranes with VF arrest. Pt with enterobacter PNA and encephalopathy, trach placed 8/30 and PEG on 8/31. MRI 8/31: No evidence of anoxic brain injury. Pt changed to #4 cuffless trach on 9/15; capped on 9/21 and decannulated on 9/25. MBS 9/22, POs started - primary oral dysphagia.        SLP Plan  Continue with current plan of care       Recommendations  Diet recommendations: Dysphagia 3 (mechanical soft);Thin liquid Liquids provided via: Cup;Teaspoon;Straw Medication Administration: Crushed with puree Supervision: Staff to assist with self feeding;Full supervision/cueing for compensatory strategies Compensations: Slow rate;Small sips/bites;Minimize environmental distractions;Follow solids with liquid Postural Changes and/or Swallow Maneuvers: Seated upright 90 degrees                Oral Care Recommendations: Oral care BID;Staff/trained caregiver to provide oral care Follow up Recommendations: Inpatient Rehab SLP Visit Diagnosis: Dysphagia, oral phase (R13.11) Plan: Continue with current plan of care       Beverly Dubberly I. Vear Clock, MS, CCC-SLP Acute Rehabilitation Services Office number [phone] Pager [phone]                Beverly [name] 11/19/2019, 5:32 PM

## 2019-11-19 NOTE — Progress Notes (Signed)
Mobility Specialist - Progress Note   11/19/19 1532  Mobility  Activity  (Cancel)   Pt currently w/ SLP.  Mamie Levers Mobility Specialist Mobility Specialist Phone: [phone]

## 2019-11-19 NOTE — Progress Notes (Signed)
Physical Therapy Treatment Patient Details Name: Beverly [name] MRN: [id number] DOB: Jun 26, 1985 Today's Date: 11/19/2019    History of Present Illness Pt is a 34 y.o. female admitted 09/23/19 with intermittent chest discomfort; of note, pt 2-weeks post-partum (had emergent C-section); while in ED, pt with VF cardiac arrest requiring intubation. Brain MRI 8/23 with no acute abnormality. S/p trach and PEG tube 8/30. Course complicated by encephalopathy, anoxic brain injury, enterobacterial PNA. Transferred out of ICU On 9/4. PMH includes HTN.    PT Comments    Patient progressing with ambulation distance/tolerance.  Still rather ataxic and weak needing several rest breaks.  She was unable to focus long enough to answer my question about where she was having pain.  She reported she did not know where she was, stated city as [place] and that she was 22 and has 2 children.  PT will continue to follow, she remains appropriate for CIR.   Follow Up Recommendations  CIR;Supervision/Assistance - 24 hour     Equipment Recommendations  Wheelchair (measurements PT);Hospital bed    Recommendations for Other Services       Precautions / Restrictions Precautions Precautions: Fall;Other (comment) Precaution Comments: impulsive, incontinence    Mobility  Bed Mobility Overal bed mobility: Needs Assistance Bed Mobility: Supine to Sit     Supine to sit: Min assist     General bed mobility comments: pt with increased time, assist to balance trunk as coming upright  Transfers Overall transfer level: Needs assistance Equipment used: 2 person hand held assist Transfers: Sit to/from Stand Sit to Stand: +2 physical assistance;Min assist;From elevated surface         General transfer comment: elevated bed height, so did not need lifting help from bed, once in recliner, needed assist for balance with +2 for safety  Ambulation/Gait Ambulation/Gait assistance: Mod assist;+2 physical  assistance Gait Distance (Feet): 250 Feet (75' x 2, 50' x 2) Assistive device: 2 person hand held assist (+ another for chair follow) Gait Pattern/deviations: Step-through pattern;Wide base of support;Ataxic;Steppage;Trunk flexed;Leaning posteriorly     General Gait Details: mod support to control trunk with cues to prevent anterior LOB and posterior support for head over hips.  Several stops to rest, noted R knee buckling with fatigue and lost her briefs ending last ambulation bout.   Stairs             Wheelchair Mobility    Modified Rankin (Stroke Patients Only)       Balance Overall balance assessment: Needs assistance Sitting-balance support: Bilateral upper extremity supported   Sitting balance - Comments: UE support seated at EOB, assist for safety pt leaning forward to attempt to don shoes   Standing balance support: Bilateral upper extremity supported Standing balance-Leahy Scale: Poor Standing balance comment: static standing min A +2                            Cognition Arousal/Alertness: Awake/alert Behavior During Therapy: Flat affect Overall Cognitive Status: Impaired/Different from baseline Area of Impairment: Orientation;Attention;Memory;Following commands;Safety/judgement;Problem solving                 Orientation Level: Disoriented to;Place;Time;Situation Current Attention Level: Focused Memory: Decreased short-term memory Following Commands: Follows one step commands consistently;Follows one step commands with increased time Safety/Judgement: Decreased awareness of deficits;Decreased awareness of safety   Problem Solving: Slow processing;Requires verbal cues;Requires tactile cues        Exercises      General  Comments General comments (skin integrity, edema, etc.): HR max 124 with ambulation      Pertinent Vitals/Pain Pain Assessment: Faces Faces Pain Scale: Hurts little more Pain Location: c/o arm pain, not able to  localize till I squeezed L deltoid Pain Descriptors / Indicators: Discomfort;Tender Pain Intervention(s): Monitored during session;Repositioned    Home Living                      Prior Function            PT Goals (current goals can now be found in the care plan section) Progress towards PT goals: Progressing toward goals    Frequency    Min 3X/week      PT Plan Current plan remains appropriate    Co-evaluation              AM-PAC PT "6 Clicks" Mobility   Outcome Measure  Help needed turning from your back to your side while in a flat bed without using bedrails?: A Little Help needed moving from lying on your back to sitting on the side of a flat bed without using bedrails?: A Little Help needed moving to and from a bed to a chair (including a wheelchair)?: A Lot Help needed standing up from a chair using your arms (e.g., wheelchair or bedside chair)?: A Little Help needed to walk in hospital room?: Total Help needed climbing 3-5 steps with a railing? : Total 6 Click Score: 13    End of Session Equipment Utilized During Treatment: Gait belt Activity Tolerance: Patient tolerated treatment well Patient left: in bed;with call bell/phone within reach;with family/visitor present   PT Visit Diagnosis: Other abnormalities of gait and mobility (R26.89);Difficulty in walking, not elsewhere classified (R26.2);Other symptoms and signs involving the nervous system (R29.898)     Time: 3329-5188 PT Time Calculation (min) (ACUTE ONLY): 33 min  Charges:  $Gait Training: 23-37 mins                     Beverly [name], PT Acute Rehabilitation Services Pager:[phone] [phone] 11/19/2019    Elray Mcgregor 11/19/2019, 4:38 PM

## 2019-11-19 NOTE — Progress Notes (Signed)
[facility] PROGRESS NOTE  Beverly [name] UUV:[id number] DOB: [birth year] DOA: 09/24/2019 PCP: Patient, No Pcp Per  Status: Inpatient-Remains inpatient appropriate because:Altered mental status, Ongoing diagnostic testing needed not appropriate for outpatient work up, Unsafe d/c plan, IV treatments appropriate due to intensity of illness or inability to take PO and Inpatient level of care appropriate due to severity of illness   Dispo: The patient is from: Home              Anticipated d/c is to: SNF vs inpatient rehabilitation at outside facility              Anticipated d/c date is: > 3 days              Patient currently is not medically stable to d/c. 2/2 UNSAFE DC PLAN. PT and OT have seen patient and recommended inpatient rehabilitation vs SNF placement.  Case has been reviewed by [facility] and they felt they could not provide the level of care the patient needed.  Other local inpatient rehab options are being explored.  Unfortunately [facility] is not "in network" for patient's insurance coverage.  Medicaid application is also pending.  10/5 [facility] in [place] reviewing clinicals but mother apprehensive about sending patient this far away noting he would be unable to visit patient daily given other responsibilities in the [place] area.  Unfortunately this would be an excellent facility for the patient since the main campus in [place] focuses on brain injuries.  **If unable to locate an inpatient rehabilitation facility need to consider discharge to a SNF with focus on management of brain injury**  Code Status: Full Family Communication: Mother at bedside 10/13 DVT prophylaxis: Lovenox Vaccination status: Has not received Covid vaccine  HPI: 34 year old female presents to the hospital 2 weeks postpartum with chest pain and in the ED sustained V. fib arrest with V. fib x3 epi x1, EKG showed ST elevation underwent Cath Lab at [facility] showed aneurysmal dilation(dissection?) of  the LAD was identified. She was transferred to [facility] for ICU admission. LVEF 30% EF in cath lab.    Significant events Echo 8/18> LVEF 30-35%, septal and apical akinesis distal anterior wall an d mid/distal inferior wall hypokinesis hyperdynamic basal function. LV size mildly dilated.  Patient suffered anoxic brain injury. 8.23:MRI brain  8/30 underwent tracheostomy 8/30 PEG tube placement 9/10: Continues to be encephalopathic, able to open eyes and track but not following any commands and not talking,is moving extremities independently 9/15: Tracheostomy downsized to # 4 cufless. 9/20:Trach CAP trial, PCCM following SNF planned for 9/30- 30 days out from trach 9/21:Patient is showing improvement with the speech therapy and PT-recommendation changed from SNF to inpatient rehab.  9/25: Trach removed.  Subjective: Cooperative.  Disoriented but easily reoriented.  Mother at bedside.  Objective: Vitals:   11/19/19 0355 11/19/19 0816  BP: 116/70 98/71  Pulse: 89 100  Resp: 17 18  Temp: 97.9 F (36.6 C) 98.4 F (36.9 C)  SpO2: 93% 96%    Intake/Output Summary (Last 24 hours) at 11/19/2019 1124 Last data filed at 11/18/2019 1200 Gross per 24 hour  Intake 120 ml  Output --  Net 120 ml   Filed Weights   11/17/19 0422 11/18/19 0500 11/19/19 0355  Weight: 89.1 kg 86.9 kg 85.9 kg    Exam:  Constitutional: NAD, calm, comfortable  Respiratory: clear to auscultation bilaterally. Normal respiratory effort. Room air Cardiovascular: Regular rate and rhythm, no murmurs / rubs / gallops. No  extremity edema. 2+ pedal pulses.   Abdomen: no tenderness, no masses palpated. Bowel sounds positive.  PEG tube in place Musculoskeletal: no clubbing / cyanosis. No contractures. Normal muscle tone.  Skin: no rashes, lesions, ulcers. No induration Neurologic: CN 2-12 grossly intact, speech patterns more coherent although still has some disfluency and evidence of perseveration sensation  grossly intact, no evidence of extremity spasms on exam.  Moving all extremities spontaneously x4.  Patient left hand dominant and left side strength including shoulder shrug appears to be weaker at 3/5 with right side strength 4/5. Psychiatric: Awake, engaging in appropriate conversation today.  Oriented to name name.  Oriented to year.  When asked where she was she stated [place].  When asked what kind of place after [facility]   Assessment/Plan: V. fib arrest due to anterior MI: -s/p cardiac cath- aneurysm proximal LAD with dissection was culprit. -Continue aspirin, Lipitor,Coreg    Acute systolic congestive heart failure with EF 35% 2/2 MI.   Daily weights with strict I's/O- has remained stable from a respiratory standpoint with persistent +3 L balance; unclear true dry weight noting at time of admission patient weighed 228 pounds with current weight 189.3 pounds Continue Coreg,Entresto Aldactone  and digoxin as above. Echocardiogram was completed 8/18 and anticipate will need to be repeated in November 3 month mark Last electrolyte panel was on 9/29-plan to repeat on 10/7  Prediabetes diabetes 2 in obese -HgbA1c 5.7 -With underlying systolic heart failure will initiate Farxiga 5 mg daily   Acute encephalopathy/acute delirium 2/2 anoxic brain injury:   -Had extensive work-up with EEG, MRI brain.   -Initially trach and PEG dependent.  But thus far has been decannulated -Oral intake remains inconsistent therefore PEG remains in place -Continues to have episodes of confusion, agitation as well as amnesia.  -Continue Trileptal as recommended by psychiatry. -Due to lethargy scheduled Vistaril discontinued previously -Also documented with significant spasticity during PT and OT sessions-Skelaxin 400 mg TID initiated on 10/7-we will not use baclofen at this juncture given CNS effects -SLP working with patient for speech and cognition  rehab  Dysphagia -SLP following and patient currently on dysphagia 3 diet but with inconsistent oral intake so recommendation is to continue tube feeding-May need to consider cutting tube feeding volume in order to promote increased appetite  Physical deconditioning 2/2 anoxic brain injury -PT/OT documents slow but continued improvement during therapy sessions.  -10/12 mobility specialist documented patient was able to ambulate with 2 person hand-held assist in chair following from her mother.  Heart rate in the 1 20-1 40 range with ambulation.  Patient was somewhat irritable impulsive during the walk.  To sit several times due to fatigue. -10/12 OT documented patient with increased ability to perform ADLs.  Was lethargic and required extensive assist with grooming while supine. -Given documentation of lethargy during therapy sessions as well as mother noting patient very sleepy today have reviewed occasions and patient has periodically been receiving injectable Vistaril in the afternoon hours but did not receive any as of yet today.  Plan to decrease Vistaril dosing from 50 mg to 25 mg IM prn  Nutrition Status: Nutrition Problem: Increased nutrient needs Etiology: post-op healing Signs/Symptoms: estimated needs Interventions: Tube feeding Estimated body mass index is 27.16 kg/m as calculated from the following:   Height as of this encounter: 5\' 10"  (1.778 m).   Weight as of this encounter: 85.9 kg.   Hypokalemia/hypomagnesemia:  -Magnesium slightly low at  1.8 on 9/29 -Started on magnesium 400 mg per tube daily on 10/5 -Pete labs on 10/7   Hypertension:  -BPs fairly stable in 90s to 100s.  -Entresto dose discontinued for suboptimal blood pressure readings   -Continue Coreg, Aldactone and Entresto.   History of SVT  -Maintaining sinus rhythm with beta-blocker described above   Diarrhea  -Resolved.   ?Aspiration pneumonia  -Has completed completed antibiotics     Data  Reviewed: Basic Metabolic Panel: Recent Labs  Lab 11/12/19 2205 11/13/19 0240 11/19/19 0257  NA 138 138 139  K 4.4 4.1 3.9  CL 102 103 105  CO2 24 24 24   GLUCOSE 100* 103* 110*  BUN 13 13 11   CREATININE 0.56 0.54 0.61  CALCIUM 9.9 9.6 10.1  MG 2.0  --  2.0  PHOS  --   --  5.3*   Liver Function Tests: Recent Labs  Lab 11/12/19 2205  AST 31  ALT 35  ALKPHOS 104  BILITOT 0.2*  PROT 7.9  ALBUMIN 3.5   No results for input(s): LIPASE, AMYLASE in the last 168 hours. No results for input(s): AMMONIA in the last 168 hours. CBC: Recent Labs  Lab 11/12/19 2205 11/19/19 0257  WBC 8.2 9.7  NEUTROABS 4.4 5.4  HGB 10.6* 11.6*  HCT 34.8* 37.6  MCV 83.5 82.5  PLT 559* 646*   Cardiac Enzymes: No results for input(s): CKTOTAL, CKMB, CKMBINDEX, TROPONINI in the last 168 hours. BNP (last 3 results) Recent Labs    10/13/19 0440 10/14/19 0500 10/15/19 0552  BNP 186.1* 200.4* 168.9*    ProBNP (last 3 results) No results for input(s): PROBNP in the last 8760 hours.  CBG: Recent Labs  Lab 11/18/19 1625 11/18/19 1956 11/18/19 2335 11/19/19 0402 11/19/19 0814  GLUCAP 106* 104* 110* 104* 98    No results found for this or any previous visit (from the past 240 hour(s)).   Studies: No results found.  Scheduled Meds: . aspirin  81 mg Per Tube Daily  . atorvastatin  80 mg Per Tube Daily  . carvedilol  6.25 mg Per Tube BID WC  . dapagliflozin propanediol  5 mg Oral Daily  . digoxin  0.125 mg Per Tube Daily  . enoxaparin (LOVENOX) injection  40 mg Subcutaneous Q24H  . feeding supplement (ENSURE ENLIVE)  237 mL Oral BID BM  . influenza vac split quadrivalent PF  0.5 mL Intramuscular Tomorrow-1000  . insulin aspart  0-15 Units Subcutaneous Q4H  . metaxalone  400 mg Oral TID  . OXcarbazepine  75 mg Per Tube BID  . prenatal multivitamin  1 tablet Per Tube Q1200  . sacubitril-valsartan  1 tablet Per Tube BID  . spironolactone  25 mg Per Tube Daily   Continuous  Infusions: . sodium chloride Stopped (11/06/19 1900)    Active Problems:   Cardiac arrest (HCC)   Acute combined systolic and diastolic heart failure (HCC)   Ventricular tachycardia, polymorphic (HCC)   Encounter for central line placement   Respiratory failure (HCC)   Acute metabolic encephalopathy   Anoxic brain injury [facility])   Dysphagia   Physical deconditioning   Acute delirium   Consultants:  Cardiology  PCCM  Interventional radiology  Neurology  CIR  Psychiatry  Procedures:  8/17 cardiac catheterization  8/18 EEG  8/18 echocardiogram 1. Septal and apical akinesis Distal anterior wall and mid/distal inferior wall hypokinesis Hyperdynamic basal function . Left ventricular ejection fraction, by estimation, is 30 to 35%. The left ventricle has moderately decreased  function. The left ventricle demonstrates regional wall motion abnormalities (see scoring diagram/findings for description). The left ventricular internal cavity size was mildly dilated. Left ventricular diastolic parameters are indeterminate. 2. Right ventricular systolic function is normal. The right ventricular size is normal. 3. The mitral valve is normal in structure. Trivial mitral valve regurgitation. No evidence of mitral stenosis. 4. The aortic valve was not well visualized. Aortic valve regurgitation is not visualized. No aortic stenosis is present. 5. The inferior vena cava is normal in size with greater  8/19 continuous EEG  8/22 EEG  8/31 EEG  8/31 gastrostomy tube placement per interventional radiology  Antibiotics: Anti-infectives (From admission, onward)   Start     Dose/Rate Route Frequency Ordered Stop   10/09/19 2100  vancomycin (VANCOREADY) IVPB 1500 mg/300 mL  Status:  Discontinued        1,500 mg 150 mL/hr over 120 Minutes Intravenous Every 12 hours 10/09/19 1237 10/10/19 1121   10/09/19 0800  ceFEPIme (MAXIPIME) 2 g in sodium chloride 0.9 % 100 mL IVPB        2 g 200 mL/hr  over 30 Minutes Intravenous Every 8 hours 10/09/19 0752 10/15/19 2147   10/09/19 0800  vancomycin (VANCOREADY) IVPB 2000 mg/400 mL        2,000 mg 200 mL/hr over 120 Minutes Intravenous  Once 10/09/19 0752 10/09/19 1147   10/07/19 1540  ceFAZolin (ANCEF) 2-4 GM/100ML-% IVPB       Note to Pharmacy: Erie Noe   : cabinet override      10/07/19 1540 10/08/19 0344   10/07/19 1400  ceFAZolin (ANCEF) IVPB 2g/100 mL premix        2 g 200 mL/hr over 30 Minutes Intravenous To Radiology 10/06/19 1339 10/07/19 1627   09/30/19 0300  vancomycin (VANCOCIN) IVPB 1000 mg/200 mL premix  Status:  Discontinued        1,000 mg 200 mL/hr over 60 Minutes Intravenous Every 8 hours 09/29/19 1809 10/01/19 0853   09/29/19 1815  ceFEPIme (MAXIPIME) 2 g in sodium chloride 0.9 % 100 mL IVPB        2 g 200 mL/hr over 30 Minutes Intravenous Every 8 hours 09/29/19 1803 10/05/19 1801   09/29/19 1815  vancomycin (VANCOREADY) IVPB 2000 mg/400 mL        2,000 mg 200 mL/hr over 120 Minutes Intravenous  Once 09/29/19 1803 09/29/19 2137   09/24/19 2200  cefTRIAXone (ROCEPHIN) 2 g in sodium chloride 0.9 % 100 mL IVPB        2 g 200 mL/hr over 30 Minutes Intravenous Daily at bedtime 09/24/19 1201 09/28/19 2338   09/24/19 0330  cefTRIAXone (ROCEPHIN) 1 g in sodium chloride 0.9 % 100 mL IVPB  Status:  Discontinued        1 g 200 mL/hr over 30 Minutes Intravenous Daily at bedtime 09/24/19 0245 09/24/19 1201   09/24/19 0300  azithromycin (ZITHROMAX) 500 mg in sodium chloride 0.9 % 250 mL IVPB  Status:  Discontinued        500 mg 250 mL/hr over 60 Minutes Intravenous Daily at bedtime 09/24/19 0245 09/24/19 1103       Time spent: 20    Junious Silk ANP  [facility] Pager [phone]. If 7PM-7AM, please contact night-coverage at [email or web address] 11/19/2019, 11:24 AM  LOS: 56 days

## 2019-11-20 DIAGNOSIS — I469 Cardiac arrest, cause unspecified: Secondary | ICD-10-CM | POA: Diagnosis not present

## 2019-11-20 DIAGNOSIS — R5381 Other malaise: Secondary | ICD-10-CM | POA: Diagnosis not present

## 2019-11-20 DIAGNOSIS — G931 Anoxic brain damage, not elsewhere classified: Secondary | ICD-10-CM | POA: Diagnosis not present

## 2019-11-20 LAB — GLUCOSE, CAPILLARY
Glucose-Capillary: 114 mg/dL — ABNORMAL HIGH (ref 70–99)
Glucose-Capillary: 116 mg/dL — ABNORMAL HIGH (ref 70–99)
Glucose-Capillary: 123 mg/dL — ABNORMAL HIGH (ref 70–99)
Glucose-Capillary: 145 mg/dL — ABNORMAL HIGH (ref 70–99)
Glucose-Capillary: 57 mg/dL — ABNORMAL LOW (ref 70–99)
Glucose-Capillary: 92 mg/dL (ref 70–99)
Glucose-Capillary: 96 mg/dL (ref 70–99)

## 2019-11-20 MED ORDER — GLUCOSE 40 % PO GEL
ORAL | Status: AC
  Administered 2019-11-20: 37.5 g
  Filled 2019-11-20: qty 1

## 2019-11-20 NOTE — Progress Notes (Signed)
[facility] PROGRESS NOTE  Beverly [name] ZOX:[id number] DOB: Jan 29, 1986 DOA: 09/24/2019 PCP: Patient, No Pcp Per  Status: Inpatient-Remains inpatient appropriate because:Altered mental status, Ongoing diagnostic testing needed not appropriate for outpatient work up, Unsafe d/c plan, IV treatments appropriate due to intensity of illness or inability to take PO and Inpatient level of care appropriate due to severity of illness   Dispo: The patient is from: Home              Anticipated d/c is to: SNF vs inpatient rehabilitation at outside facility              Anticipated d/c date is: > 3 days              Patient currently is not medically stable to d/c. 2/2 UNSAFE DC PLAN. PT and OT have seen patient and recommended inpatient rehabilitation vs SNF placement.  Case has been reviewed by [facility] and they felt they could not provide the level of care the patient needed.  Other local inpatient rehab options are being explored.  Unfortunately [facility] is not "in network" for patient's insurance coverage.  Medicaid application is also pending.  10/5 [facility] in [place] reviewing clinicals but mother apprehensive about sending patient this far away noting he would be unable to visit patient daily given other responsibilities in the [place] area.  Unfortunately this would be an excellent facility for the patient since the main campus in [place] focuses on brain injuries.  Mother remains 46.  During PT session on 10/13 while mother and discussing discharge plan patient stated to her mother "you know I am not always going to be like this I am getting better"  **If unable to locate an inpatient rehabilitation facility need to consider discharge to a SNF with focus on management of brain injury**  Code Status: Full Family Communication: Mother at bedside 10/13 DVT prophylaxis: Lovenox Vaccination status: Has not received Covid vaccine  HPI: 34 year old female presents to the hospital 2  weeks postpartum with chest pain and in the ED sustained V. fib arrest with V. fib x3 epi x1, EKG showed ST elevation underwent Cath Lab at [facility] showed aneurysmal dilation(dissection?) of the LAD was identified. She was transferred to [facility] for ICU admission. LVEF 30% EF in cath lab.    Significant events Echo 8/18> LVEF 30-35%, septal and apical akinesis distal anterior wall an d mid/distal inferior wall hypokinesis hyperdynamic basal function. LV size mildly dilated.  Patient suffered anoxic brain injury. 8.23:MRI brain  8/30 underwent tracheostomy 8/30 PEG tube placement 9/10: Continues to be encephalopathic, able to open eyes and track but not following any commands and not talking,is moving extremities independently 9/15: Tracheostomy downsized to # 4 cufless. 9/20:Trach CAP trial, PCCM following SNF planned for 9/30- 30 days out from trach 9/21:Patient is showing improvement with the speech therapy and PT-recommendation changed from SNF to inpatient rehab.  9/25: Trach removed.  Subjective: Cooperative.  Remains somewhat disoriented but cooperative and easily redirected.  Objective: Vitals:   11/20/19 0759 11/20/19 1156  BP: 115/70 102/66  Pulse: 92 96  Resp: 16 18  Temp: 97.7 F (36.5 C) 98.3 F (36.8 C)  SpO2: 96% 100%    Intake/Output Summary (Last 24 hours) at 11/20/2019 1344 Last data filed at 11/19/2019 1400 Gross per 24 hour  Intake 237 ml  Output --  Net 237 ml   Filed Weights   11/17/19 0422 11/18/19 0500 11/19/19 0355  Weight: 89.1 kg  86.9 kg 85.9 kg    Exam:  Constitutional: NAD, calm, comfortable  Respiratory: clear to auscultation bilaterally. Normal respiratory effort. Room air Cardiovascular: Regular rate and rhythm, no murmurs / rubs / gallops. No extremity edema. 2+ pedal pulses.   Abdomen: no tenderness, no masses palpated. Bowel sounds positive.  PEG tube in place Musculoskeletal: no clubbing / cyanosis. No contractures. Normal  muscle tone.  Skin: no rashes, lesions, ulcers. No induration Neurologic: CN 2-12 grossly intact, speech patterns more coherent although still has some disfluency and evidence of perseveration sensation grossly intact, no evidence of extremity spasms on exam.  Moving all extremities spontaneously x4.  Patient left hand dominant and left side strength including shoulder shrug appears to be weaker at 3/5 with right side strength 4/5. Psychiatric: Awake, engaging in appropriate conversation today.  Oriented to name and place but not to date   Assessment/Plan: V. fib arrest due to anterior MI: -s/p cardiac cath- aneurysm proximal LAD with dissection was culprit. -Continue aspirin, Lipitor,Coreg    Acute systolic congestive heart failure with EF 35% 2/2 MI.   Daily weights with strict I's/O- has remained stable from a respiratory standpoint with persistent +3 L balance; unclear true dry weight noting at time of admission patient weighed 228 pounds with current weight 189.3 pounds Continue Coreg,Entresto Aldactone  and digoxin as above. Echocardiogram was completed 8/18 and anticipate will need to be repeated in November 3 month mark Last electrolyte panel was on 9/29-plan to repeat on 10/7  Prediabetes diabetes 2 in obese -HgbA1c 5.7 -With underlying systolic heart failure will initiate Farxiga 5 mg daily   Acute encephalopathy/acute delirium 2/2 anoxic brain injury:   -Had extensive work-up with EEG, MRI brain.   -Initially trach and PEG dependent.  But thus far has been decannulated -Oral intake remains inconsistent therefore PEG remains in place -Continues to have episodes of confusion, agitation as well as amnesia.  -Continue Trileptal as recommended by psychiatry. -Due to lethargy scheduled Vistaril discontinued previously -Also documented with significant spasticity during PT and OT sessions-Skelaxin 400 mg TID initiated on 10/7-we will not use baclofen at this juncture given CNS  effects -SLP working with patient for speech and cognition rehab  Dysphagia -SLP following and patient currently on dysphagia 3 diet but with inconsistent oral intake so recommendation is to continue tube feeding-May need to consider cutting tube feeding volume in order to promote increased appetite  Physical deconditioning 2/2 anoxic brain injury -PT/OT documents slow but continued improvement during therapy sessions.  -10/12 mobility specialist documented patient was able to ambulate with 2 person hand-held assist in chair following from her mother.  Heart rate in the 1 20-1 40 range with ambulation.  Patient was somewhat irritable impulsive during the walk.  To sit several times due to fatigue. -10/12 OT documented patient with increased ability to perform ADLs.  Was lethargic and required extensive assist with grooming while supine.  Nutrition Status: Nutrition Problem: Increased nutrient needs Etiology: post-op healing Signs/Symptoms: estimated needs Interventions: Tube feeding Estimated body mass index is 27.16 kg/m as calculated from the following:   Height as of this encounter: 5\' 10"  (1.778 m).   Weight as of this encounter: 85.9 kg.   Hypokalemia/hypomagnesemia:  -Magnesium slightly low at 1.8 on 9/29 -Started on magnesium 400 mg per tube daily on 10/5 -Pete labs on 10/7   Hypertension:  -BPs fairly stable in 90s to 100s.  -Entresto dose discontinued for suboptimal blood pressure readings   -Continue Coreg, Aldactone and  Entresto.   History of SVT  -Maintaining sinus rhythm with beta-blocker described above   Diarrhea  -Resolved.   ?Aspiration pneumonia  -Has completed completed antibiotics     Data Reviewed: Basic Metabolic Panel: Recent Labs  Lab 11/19/19 0257  NA 139  K 3.9  CL 105  CO2 24  GLUCOSE 110*  BUN 11  CREATININE 0.61  CALCIUM 10.1  MG 2.0  PHOS 5.3*   Liver Function Tests: No results for input(s): AST, ALT, ALKPHOS, BILITOT, PROT,  ALBUMIN in the last 168 hours. No results for input(s): LIPASE, AMYLASE in the last 168 hours. No results for input(s): AMMONIA in the last 168 hours. CBC: Recent Labs  Lab 11/19/19 0257  WBC 9.7  NEUTROABS 5.4  HGB 11.6*  HCT 37.6  MCV 82.5  PLT 646*   Cardiac Enzymes: No results for input(s): CKTOTAL, CKMB, CKMBINDEX, TROPONINI in the last 168 hours. BNP (last 3 results) Recent Labs    10/13/19 0440 10/14/19 0500 10/15/19 0552  BNP 186.1* 200.4* 168.9*    ProBNP (last 3 results) No results for input(s): PROBNP in the last 8760 hours.  CBG: Recent Labs  Lab 11/19/19 2010 11/19/19 2354 11/20/19 0404 11/20/19 0802 11/20/19 1202  GLUCAP 90 116* 96 114* 123*    No results found for this or any previous visit (from the past 240 hour(s)).   Studies: No results found.  Scheduled Meds: . aspirin  81 mg Per Tube Daily  . atorvastatin  80 mg Per Tube Daily  . carvedilol  6.25 mg Per Tube BID WC  . dapagliflozin propanediol  5 mg Oral Daily  . digoxin  0.125 mg Per Tube Daily  . enoxaparin (LOVENOX) injection  40 mg Subcutaneous Q24H  . feeding supplement  237 mL Oral BID BM  . influenza vac split quadrivalent PF  0.5 mL Intramuscular Tomorrow-1000  . insulin aspart  0-15 Units Subcutaneous Q4H  . metaxalone  400 mg Oral TID  . OXcarbazepine  75 mg Per Tube BID  . prenatal multivitamin  1 tablet Per Tube Q1200  . sacubitril-valsartan  1 tablet Per Tube BID  . spironolactone  25 mg Per Tube Daily   Continuous Infusions: . sodium chloride Stopped (11/06/19 1900)    Active Problems:   Cardiac arrest (HCC)   Acute combined systolic and diastolic heart failure (HCC)   Ventricular tachycardia, polymorphic (HCC)   Encounter for central line placement   Respiratory failure (HCC)   Acute metabolic encephalopathy   Anoxic brain injury [facility])   Dysphagia   Physical deconditioning   Acute delirium   Consultants:  Cardiology  PCCM  Interventional  radiology  Neurology  CIR  Psychiatry  Procedures:  8/17 cardiac catheterization  8/18 EEG  8/18 echocardiogram 1. Septal and apical akinesis Distal anterior wall and mid/distal inferior wall hypokinesis Hyperdynamic basal function . Left ventricular ejection fraction, by estimation, is 30 to 35%. The left ventricle has moderately decreased function. The left ventricle demonstrates regional wall motion abnormalities (see scoring diagram/findings for description). The left ventricular internal cavity size was mildly dilated. Left ventricular diastolic parameters are indeterminate. 2. Right ventricular systolic function is normal. The right ventricular size is normal. 3. The mitral valve is normal in structure. Trivial mitral valve regurgitation. No evidence of mitral stenosis. 4. The aortic valve was not well visualized. Aortic valve regurgitation is not visualized. No aortic stenosis is present. 5. The inferior vena cava is normal in size with greater  8/19 continuous EEG  8/22 EEG  8/31 EEG  8/31 gastrostomy tube placement per interventional radiology  Antibiotics: Anti-infectives (From admission, onward)   Start     Dose/Rate Route Frequency Ordered Stop   10/09/19 2100  vancomycin (VANCOREADY) IVPB 1500 mg/300 mL  Status:  Discontinued        1,500 mg 150 mL/hr over 120 Minutes Intravenous Every 12 hours 10/09/19 1237 10/10/19 1121   10/09/19 0800  ceFEPIme (MAXIPIME) 2 g in sodium chloride 0.9 % 100 mL IVPB        2 g 200 mL/hr over 30 Minutes Intravenous Every 8 hours 10/09/19 0752 10/15/19 2147   10/09/19 0800  vancomycin (VANCOREADY) IVPB 2000 mg/400 mL        2,000 mg 200 mL/hr over 120 Minutes Intravenous  Once 10/09/19 0752 10/09/19 1147   10/07/19 1540  ceFAZolin (ANCEF) 2-4 GM/100ML-% IVPB       Note to Pharmacy: Erie Noe   : cabinet override      10/07/19 1540 10/08/19 0344   10/07/19 1400  ceFAZolin (ANCEF) IVPB 2g/100 mL premix        2 g 200 mL/hr  over 30 Minutes Intravenous To Radiology 10/06/19 1339 10/07/19 1627   09/30/19 0300  vancomycin (VANCOCIN) IVPB 1000 mg/200 mL premix  Status:  Discontinued        1,000 mg 200 mL/hr over 60 Minutes Intravenous Every 8 hours 09/29/19 1809 10/01/19 0853   09/29/19 1815  ceFEPIme (MAXIPIME) 2 g in sodium chloride 0.9 % 100 mL IVPB        2 g 200 mL/hr over 30 Minutes Intravenous Every 8 hours 09/29/19 1803 10/05/19 1801   09/29/19 1815  vancomycin (VANCOREADY) IVPB 2000 mg/400 mL        2,000 mg 200 mL/hr over 120 Minutes Intravenous  Once 09/29/19 1803 09/29/19 2137   09/24/19 2200  cefTRIAXone (ROCEPHIN) 2 g in sodium chloride 0.9 % 100 mL IVPB        2 g 200 mL/hr over 30 Minutes Intravenous Daily at bedtime 09/24/19 1201 09/28/19 2338   09/24/19 0330  cefTRIAXone (ROCEPHIN) 1 g in sodium chloride 0.9 % 100 mL IVPB  Status:  Discontinued        1 g 200 mL/hr over 30 Minutes Intravenous Daily at bedtime 09/24/19 0245 09/24/19 1201   09/24/19 0300  azithromycin (ZITHROMAX) 500 mg in sodium chloride 0.9 % 250 mL IVPB  Status:  Discontinued        500 mg 250 mL/hr over 60 Minutes Intravenous Daily at bedtime 09/24/19 0245 09/24/19 1103       Time spent: 20    Junious Silk ANP  [facility] Pager [phone]. If 7PM-7AM, please contact night-coverage at [email or web address] 11/20/2019, 1:44 PM  LOS: 57 days

## 2019-11-20 NOTE — Progress Notes (Signed)
Mobility Specialist - Progress Note   11/20/19 1452  Mobility  Activity Ambulated in hall  Level of Assistance +2 (takes two people)  Assistive Device Wheelchair  Distance Ambulated (ft) 150 ft (Intervals: 100 ft x 1, 50 ft x 1)  Mobility Response Tolerated fair  Mobility performed by Mobility specialist;Nurse  $Mobility charge 1 Mobility    Pre-mobility: 112 HR During mobility: 125-142 HR Post-mobility: 111 HR  Pt ambulated w/ 2 person hand-held assist for stability and safety. Chair follow by RN. Pt continued to be irritable and impulsive while walking.   Mamie Levers Mobility Specialist Mobility Specialist Phone: [phone]

## 2019-11-20 NOTE — Progress Notes (Signed)
Occupational therapy progress note  Pt. Was agreeable to sitting eob for ADLs. Pt. Was s to min guard assist for balance for grooming eob. Pt. Refused any further grooming. Pt. Mother was ed on rom for b ue. Pt. Mother wants her to have splints for b hand for contracture management. This will be addressed. Pt.became agitated and was attempted to kick therapist during rom.      11/20/19 1000  OT Visit Information  Last OT Received On 11/20/19  Assistance Needed +2  History of Present Illness Pt is a 34 y.o. female admitted 09/23/19 with intermittent chest discomfort; of note, pt 2-weeks post-partum (had emergent C-section); while in ED, pt with VF cardiac arrest requiring intubation. Brain MRI 8/23 with no acute abnormality. S/p trach and PEG tube 8/30. Course complicated by encephalopathy, anoxic brain injury, enterobacterial PNA. Transferred out of ICU On 9/4. PMH includes HTN.  Precautions  Precautions Fall;Other (comment)  Precaution Comments very impulsive. agitated  Pain Assessment  Pain Assessment Faces  Faces Pain Scale 4  Pain Location b hands with stretching of tendons into ext  Pain Descriptors / Indicators Discomfort  Pain Intervention(s) Monitored during session  Cognition  Arousal/Alertness Awake/alert  Behavior During Therapy Agitated  Overall Cognitive Status Impaired/Different from baseline  Area of Impairment Orientation;Attention;Memory;Following commands;Safety/judgement;Problem solving  Orientation Level Disoriented to;Place;Time;Situation  Current Attention Level Focused  Memory Decreased short-term memory  Following Commands Follows multi-step commands inconsistently  Safety/Judgement Decreased awareness of deficits;Decreased awareness of safety  Awareness Intellectual  Problem Solving Slow processing;Requires verbal cues;Requires tactile cues  General Comments Pt. agiated and was kicking therapist  Difficult to assess due to Level of arousal  ADL  Grooming  Wash/dry face;Wash/dry hands;Bed level;Total assistance  General ADL Comments Pt. would only perform grooming then layed back down  Bed Mobility  Overal bed mobility Needs Assistance  Bed Mobility Supine to Sit;Sit to Supine  Supine to sit Min guard  Sit to supine Min guard  Balance  Sitting balance-Leahy Scale Fair  Standing balance-Leahy Scale Poor  Restrictions  Weight Bearing Restrictions No  General Comments  General comments (skin integrity, edema, etc.) hr 121 max   General Exercises - Upper Extremity  Wrist Flexion AAROM;10 reps;Supine  Wrist Extension AAROM;10 reps;Supine  Digit Composite Flexion AAROM;Both;20 reps  Composite Extension Self ROM;15 reps;Supine  OT - End of Session  Activity Tolerance Treatment limited secondary to agitation  Patient left in bed;with call bell/phone within reach;with bed alarm set;with family/visitor present  Nurse Communication  (ok therapy)  OT Assessment/Plan  OT Plan Frequency remains appropriate;Discharge plan remains appropriate  OT Visit Diagnosis Other symptoms and signs involving cognitive function;Muscle weakness (generalized) (M62.81)  OT Frequency (ACUTE ONLY) Min 2X/week  Recommendations for Other Services Rehab consult  Follow Up Recommendations CIR  OT Equipment Wheelchair (measurements OT);Wheelchair cushion (measurements OT);Hospital bed  AM-PAC OT "6 Clicks" Daily Activity Outcome Measure (Version 2)  Help from another person eating meals? 1  Help from another person taking care of personal grooming? 1  Help from another person toileting, which includes using toliet, bedpan, or urinal? 1  Help from another person bathing (including washing, rinsing, drying)? 1  Help from another person to put on and taking off regular upper body clothing? 1  Help from another person to put on and taking off regular lower body clothing? 1  6 Click Score 6  OT Goal Progression  Progress towards OT goals Progressing toward goals  Acute  Rehab OT Goals  Patient Stated  Goal to improve mobility quality  OT Goal Formulation With family  Time For Goal Achievement 12/04/19  Potential to Achieve Goals Fair  ADL Goals  Pt Will Perform Grooming with max assist;sitting  Pt/caregiver will Perform Home Exercise Program Increased strength;Increased ROM;Both right and left upper extremity;With minimal assist;With written HEP provided  Additional ADL Goal #1 Pt will follow 1 step command with 25% accuracy.  Additional ADL Goal #2 Pt will visually track/attend to meaningful item with max cues.  OT Time Calculation  OT Start Time (ACUTE ONLY) 1045  OT Stop Time (ACUTE ONLY) 1124  OT Time Calculation (min) 39 min  OT General Charges  $OT Visit 1 Visit  OT Treatments  $Self Care/Home Management  8-22 mins  $Therapeutic Exercise 23-37 mins    Derrek Gu OT/L

## 2019-11-21 DIAGNOSIS — I469 Cardiac arrest, cause unspecified: Secondary | ICD-10-CM | POA: Diagnosis not present

## 2019-11-21 DIAGNOSIS — R1312 Dysphagia, oropharyngeal phase: Secondary | ICD-10-CM | POA: Diagnosis not present

## 2019-11-21 LAB — GLUCOSE, CAPILLARY
Glucose-Capillary: 101 mg/dL — ABNORMAL HIGH (ref 70–99)
Glucose-Capillary: 106 mg/dL — ABNORMAL HIGH (ref 70–99)
Glucose-Capillary: 115 mg/dL — ABNORMAL HIGH (ref 70–99)
Glucose-Capillary: 132 mg/dL — ABNORMAL HIGH (ref 70–99)
Glucose-Capillary: 87 mg/dL (ref 70–99)
Glucose-Capillary: 98 mg/dL (ref 70–99)

## 2019-11-21 NOTE — Progress Notes (Signed)
Occupational Therapy Treatment Patient Details Name: Beverly [name] MRN: [id number] DOB: August 01, 1985 Today's Date: 11/21/2019    History of present illness Pt is a 34 y.o. female admitted 09/23/19 with intermittent chest discomfort; of note, pt 2-weeks post-partum (had emergent C-section); while in ED, pt with VF cardiac arrest requiring intubation. Brain MRI 8/23 with no acute abnormality. S/p trach and PEG tube 8/30. Course complicated by encephalopathy, anoxic brain injury, enterobacterial PNA. Transferred out of ICU On 9/4. PMH includes HTN.   OT comments  Patient assessed for splints this date.  OT received order from MD for bilateral resting hand splints.  Order placed for nursing, and [name] assess for initial fit.  OT to finalize fit, and establish wear and care schedule.  Patient has ongoing mixed flexion tone that she can break out of, but she is at a higher risk for flexion contractures to her bilateral hands.  OT discussed this with the patient and her mother.  The plan [name] be to wear them at night, allowing her more freedom during the day to use her hands as appropriate.  OT to continue to follow in the acute setting.  The patient needs intensive post acute rehab from a facility that specializes in head injuries.    Follow Up Recommendations  SNF    Equipment Recommendations  Wheelchair (measurements OT);Wheelchair cushion (measurements OT);Hospital bed    Recommendations for Other Services Rehab consult    Precautions / Restrictions   Falls                                                                                    General Comments      Pertinent Vitals/ Pain       Pain Assessment: No/denies pain                                                          Frequency  Min 2X/week        Progress Toward Goals  OT Goals(current goals can now be found in the care plan section)  Progress  towards OT goals: Progressing toward goals  Acute Rehab OT Goals Patient Stated Goal: Waiting for Rehab placement OT Goal Formulation: With patient/family Time For Goal Achievement: 12/04/19 Potential to Achieve Goals: Fair  Plan Frequency remains appropriate;Discharge plan remains appropriate    Co-evaluation                 AM-PAC OT "6 Clicks" Daily Activity     Outcome Measure   Help from another person eating meals?: Total Help from another person taking care of personal grooming?: Total Help from another person toileting, which includes using toliet, bedpan, or urinal?: Total Help from another person bathing (including washing, rinsing, drying)?: Total Help from another person to put on and taking off regular upper body clothing?: Total Help from another person to put on and taking off regular lower body clothing?: Total 6 Click Score: 6    End of Session  OT Visit Diagnosis: Other symptoms and signs involving cognitive function;Muscle weakness (generalized) (M62.81)   Activity Tolerance Patient tolerated treatment well   Patient Left in bed;with call bell/phone within reach;with bed alarm set;with family/visitor present   Nurse Communication  Order for resting hand splints.          Time: 2878-6767 OT Time Calculation (min): 22 min  Charges: OT General Charges $OT Visit: 1 Visit OT Treatments $Orthotics Fit/Training: 8-22 mins  11/21/2019  Rich, OTR/L  Acute Rehabilitation Services  Office:  [phone]    Suzanna Obey 11/21/2019, 3:44 PM

## 2019-11-21 NOTE — Progress Notes (Signed)
Inpatient Diabetes Program Recommendations  AACE/ADA: New Consensus Statement on Inpatient Glycemic Control (2015)  Target Ranges:  Prepandial:   less than 140 mg/dL      Peak postprandial:   less than 180 mg/dL (1-2 hours)      Critically ill patients:  140 - 180 mg/dL   Lab Results  Component Value Date   GLUCAP 115 (H) 11/21/2019   HGBA1C 5.7 (H) 09/24/2019    Review of Glycemic Control Results for LAPARIS, DURRETT (MRN [id number]) as of 11/21/2019 10:09  Ref. Range 11/20/2019 20:17 11/20/2019 21:32 11/20/2019 23:53 11/21/2019 03:54 11/21/2019 09:31  Glucose-Capillary Latest Ref Range: 70 - 99 mg/dL 57 (L) 366 (H) 98 440 (H) 115 (H)   Diabetes history:  Pre-daibetes Outpatient Diabetes medications None Current orders for Inpatient glycemic control:  Farxiga 5 mg daily Novolog 0-15 q4h  Inpatient Diabetes Program Recommendations:    Might consider decreasing correction to Novolog 0-9 q4h as patient was 57 mg/dL at 3474 last evening after receiving 2 units of Novolog.  Will continue to follow while inpatient.  Thank you, Dulce Sellar, RN, BSN Diabetes Coordinator Inpatient Diabetes Program [phone] (team pager from 8a-5p)

## 2019-11-21 NOTE — TOC Progression Note (Addendum)
Transition of Care [facility]) - Progression Note    Patient Details  Name: Beverly [name] MRN: [id number] Date of Birth: 07-13-1985  Transition of Care [facility]) CM/SW Contact  Graves-Bigelow, Lamar Laundry, RN Phone Number: 11/21/2019, 1:14 PM  Clinical Narrative: Case Manager reached out to Inpatient Rehab at [facility] and spoke with Velna Hatchet. Per Inpatient Rehab Coordinator, she feels that patient will not be a candidate for CIR-not able to tolerate the three hours of therapy and feels that the patient may be benefit from skilled nursing facility level of care. Case Manager did discuss this update with the CSW. Case Manager will continue to follow for additional transition of care needs.        1330 11-21-19 Case Manager tried to call mother at [phone] to inform her of information regarding CIR at [facility] and the mailbox was full. Hopefully she will see a missed call and return the phone call.   Expected Discharge Plan: IP Rehab Facility Barriers to Discharge: Continued Medical Work up, SNF Pending Medicaid, SNF Pending bed offer  Expected Discharge Plan and Services Expected Discharge Plan: IP Rehab Facility In-house Referral: Clinical Social Work   Post Acute Care Choice: IP Rehab    Readmission Risk Interventions No flowsheet data found.

## 2019-11-21 NOTE — Progress Notes (Signed)
Physical Therapy Treatment Patient Details Name: Beverly [name] MRN: [id number] DOB: Sep 30, 1985 Today's Date: 11/21/2019    History of Present Illness Pt is a 34 y.o. female admitted 09/23/19 with intermittent chest discomfort; of note, pt 2-weeks post-partum (had emergent C-section); while in ED, pt with VF cardiac arrest requiring intubation. Brain MRI 8/23 with no acute abnormality. S/p trach and PEG tube 8/30. Course complicated by encephalopathy, anoxic brain injury, enterobacterial PNA. Transferred out of ICU On 9/4. PMH includes HTN.    PT Comments    Pt continues to make good progress. Continue to recommend inpatient rehab.    Follow Up Recommendations  CIR;Supervision/Assistance - 24 hour     Equipment Recommendations  Wheelchair (measurements PT);Hospital bed    Recommendations for Other Services       Precautions / Restrictions Precautions Precautions: Fall;Other (comment) Precaution Comments: impulsive, incontinence    Mobility  Bed Mobility Overal bed mobility: Needs Assistance Bed Mobility: Supine to Sit;Sit to Supine     Supine to sit: Min guard Sit to supine: Min guard   General bed mobility comments: Assist for safety but no physical assist  Transfers Overall transfer level: Needs assistance Equipment used: 2 person hand held assist;Bilateral platform walker Transfers: Sit to/from Stand Sit to Stand: +2 physical assistance;Min assist         General transfer comment: Assist to bring hips up and for trunk control  Ambulation/Gait Ambulation/Gait assistance: Mod assist;+2 physical assistance Gait Distance (Feet): 175 Feet (x 2) Assistive device: 2 person hand held assist;Bilateral platform walker Gait Pattern/deviations: Decreased step length - right;Decreased step length - left;Leaning posteriorly;Trunk flexed;Wide base of support;Step-through pattern;Ataxic Gait velocity: decr Gait velocity interpretation: <1.8 ft/sec, indicate of risk for  recurrent falls General Gait Details: Used bilateral platform walker on first walk. Assist for trunk control and assist to steer, move walker. On second walk used hand held assist. Assist for trunk control. Knees with decr control as pt fatigued . Incr trunk sway in all directions   Stairs             Wheelchair Mobility    Modified Rankin (Stroke Patients Only)       Balance Overall balance assessment: Needs assistance Sitting-balance support: No upper extremity supported;Feet supported Sitting balance-Leahy Scale: Poor Sitting balance - Comments: Sat EOB with min guard. No loss of balance   Standing balance support: Bilateral upper extremity supported Standing balance-Leahy Scale: Poor Standing balance comment: Stood with bilateral platform walker with min assist x 4 minutes for pericare. Without walker pt +2 min/mod                            Cognition Arousal/Alertness: Awake/alert Behavior During Therapy: Impulsive Overall Cognitive Status: Impaired/Different from baseline Area of Impairment: Attention;Memory;Safety/judgement;Following commands;Awareness;Problem solving;Orientation                 Orientation Level: Disoriented to;Time Current Attention Level: Sustained Memory: Decreased recall of precautions;Decreased short-term memory Following Commands: Follows one step commands with increased time Safety/Judgement: Decreased awareness of safety;Decreased awareness of deficits Awareness: Intellectual Problem Solving: Slow processing;Difficulty sequencing;Requires verbal cues;Requires tactile cues        Exercises      General Comments        Pertinent Vitals/Pain Pain Assessment: No/denies pain    Home Living                      Prior Function  PT Goals (current goals can now be found in the care plan section) Acute Rehab PT Goals Patient Stated Goal: go home Progress towards PT goals: Progressing toward  goals    Frequency    Min 3X/week      PT Plan Current plan remains appropriate    Co-evaluation              AM-PAC PT "6 Clicks" Mobility   Outcome Measure  Help needed turning from your back to your side while in a flat bed without using bedrails?: A Little Help needed moving from lying on your back to sitting on the side of a flat bed without using bedrails?: A Little Help needed moving to and from a bed to a chair (including a wheelchair)?: A Lot Help needed standing up from a chair using your arms (e.g., wheelchair or bedside chair)?: A Little Help needed to walk in hospital room?: A Lot Help needed climbing 3-5 steps with a railing? : Total 6 Click Score: 14    End of Session Equipment Utilized During Treatment: Gait belt Activity Tolerance: Patient tolerated treatment well Patient left: in bed;with call bell/phone within reach;with family/visitor present Nurse Communication: Mobility status PT Visit Diagnosis: Other abnormalities of gait and mobility (R26.89);Difficulty in walking, not elsewhere classified (R26.2);Other symptoms and signs involving the nervous system (R29.898)     Time: 8144-8185 PT Time Calculation (min) (ACUTE ONLY): 28 min  Charges:  $Gait Training: 23-37 mins                     [facility] PT Acute Rehabilitation Services Pager [phone] Office [phone]    [name] [facility] 11/21/2019, 12:03 PM

## 2019-11-21 NOTE — Progress Notes (Signed)
  Speech Language Pathology Treatment: Dysphagia  Patient Details Name: Beverly [name] MRN: [id number] DOB: Oct 14, 1985 Today's Date: 11/21/2019 Time: 1240-1300 SLP Time Calculation (min) (ACUTE ONLY): 20 min  Assessment / Plan / Recommendation Clinical Impression  Pt was seen during lunch for dysphagia treatment with her mother present. [name] reported that the pt has been tolerating the current diet without significant difficulty but required encouragement yesterday to eat. She consumed a meal of mashed potatoes, green beans, meatloaf, and thin liquids. No s/sx of aspiration were noted during the session. She exhibited impaired bolus awareness with solids and oral holding was intermittently noted. Right-sided pocketing was observed with meats. With cueing, pt was able to demonstrate lingual sweeps to reduce residue and it was eliminated with a liquid wash. Pt was less responsive to verbal prompts for use of speech intelligibility strategies. However, this may have been due to her eating. It is recommended that her current diet be continued and SLP will continue to follow pt.    HPI HPI: Pt is a 34 y.o. female with PMH of HTN who was admitted 09/23/19 2 weeks post partum after emergent C section due to rupture of membranes with VF arrest. Pt with enterobacter PNA and encephalopathy, trach placed 8/30 and PEG on 8/31. MRI 8/31: No evidence of anoxic brain injury. Pt changed to #4 cuffless trach on 9/15; capped on 9/21 and decannulated on 9/25. MBS 9/22, POs started - primary oral dysphagia.        SLP Plan  Continue with current plan of care       Recommendations  Diet recommendations: Dysphagia 3 (mechanical soft);Thin liquid Liquids provided via: Cup;Teaspoon;Straw Medication Administration: Crushed with puree Supervision: Staff to assist with self feeding;Full supervision/cueing for compensatory strategies Compensations: Slow rate;Small sips/bites;Minimize environmental  distractions;Follow solids with liquid Postural Changes and/or Swallow Maneuvers: Seated upright 90 degrees                Oral Care Recommendations: Oral care BID;Staff/trained caregiver to provide oral care Follow up Recommendations: Inpatient Rehab SLP Visit Diagnosis: Dysphagia, oral phase (R13.11) Plan: Continue with current plan of care       Beverly [name] I. Beverly Clock, MS, CCC-SLP Acute Rehabilitation Services Office number [phone] Pager [phone]      Beverly [name] 11/21/2019, 2:18 PM

## 2019-11-21 NOTE — Progress Notes (Signed)
Orthopedic Tech Progress Note Patient Details:  Beverly [name] 10-17-1985 [id number] Called in order to [facility] Patient ID: Beverly [name], female   DOB: [birth year], 34 y.o.   MRN: [id number]   Beverly [name] 11/21/2019, 4:24 PM

## 2019-11-21 NOTE — Progress Notes (Signed)
[facility] PROGRESS NOTE  Beverly [name]:[id number] DOB: April 02, 1985 DOA: 09/24/2019 PCP: Patient, No Pcp Per  Status: Inpatient-Remains inpatient appropriate because:Altered mental status, Ongoing diagnostic testing needed not appropriate for outpatient work up, Unsafe d/c plan, IV treatments appropriate due to intensity of illness or inability to take PO and Inpatient level of care appropriate due to severity of illness   Dispo: The patient is from: Home              Anticipated d/c is to: SNF vs inpatient rehabilitation at outside facility              Anticipated d/c date is: > 3 days              Patient currently is not medically stable to d/c. 2/2 UNSAFE DC PLAN. PT and OT have seen patient and recommended inpatient rehabilitation vs SNF placement.  Case has been reviewed by [facility] IP rehab and they felt they could not provide the level of care the patient needed.  Other local inpatient rehab options are being explored.  Unfortunately CIR is not "in network" for patient's insurance coverage.  Medicaid application is also pending.  10/5 [facility] in [place] reviewing clinicals but mother apprehensive about sending patient this far away noting he would be unable to visit patient daily given other responsibilities in the [place] area.  Unfortunately this would be an excellent facility for the patient since the main campus in [place] focuses on brain injuries.  Mother remains 20.  During PT session on 10/13 while mother and discussing discharge plan patient stated to her mother "you know I am not always going to be like this I am getting better"  **If unable to locate an inpatient rehabilitation facility need to consider discharge to a SNF with focus on management of brain injury**  Code Status: Full Family Communication: Mother at bedside 10/13 DVT prophylaxis: Lovenox Vaccination status: Has not received Covid vaccine  HPI: 34 year old female presents to the hospital 2  weeks postpartum with chest pain and in the ED sustained V. fib arrest with V. fib x3 epi x1, EKG showed ST elevation underwent Cath Lab at [facility] showed aneurysmal dilation(dissection?) of the LAD was identified. She was transferred to [facility] for ICU admission. LVEF 30% EF in cath lab.    Significant events Echo 8/18> LVEF 30-35%, septal and apical akinesis distal anterior wall an d mid/distal inferior wall hypokinesis hyperdynamic basal function. LV size mildly dilated.  Patient suffered anoxic brain injury. 8.23:MRI brain  8/30 underwent tracheostomy 8/30 PEG tube placement 9/10: Continues to be encephalopathic, able to open eyes and track but not following any commands and not talking,is moving extremities independently 9/15: Tracheostomy downsized to # 4 cufless. 9/20:Trach CAP trial, PCCM following SNF planned for 9/30- 30 days out from trach 9/21:Patient is showing improvement with the speech therapy and PT-recommendation changed from SNF to inpatient rehab.  9/25: Trach removed.  Subjective: Patient awake and alert.  Father at bedside.  Attempting to feed self.  Objective: Vitals:   11/21/19 0933 11/21/19 1215  BP: 108/67 (!) 123/91  Pulse: 91 70  Resp: 18 20  Temp: 98 F (36.7 C) 97.7 F (36.5 C)  SpO2: 99% 98%   No intake or output data in the 24 hours ending 11/21/19 1219 Filed Weights   11/17/19 0422 11/18/19 0500 11/19/19 0355  Weight: 89.1 kg 86.9 kg 85.9 kg    Exam:  Constitutional: NAD, calm, comfortable  Respiratory: clear  to auscultation bilaterally. Normal respiratory effort. Room air Cardiovascular: Regular rate and rhythm, no murmurs / rubs / gallops. No extremity edema.  Abdomen: no tenderness, no masses palpated. Bowel sounds positive.  PEG tube in place tolerating solid diet although in small portions at 50% of meals Musculoskeletal: no clubbing / cyanosis. No contractures. Normal muscle tone.  Skin: no rashes, lesions, ulcers. No  induration Neurologic: CN 2-12 grossly intact, speech patterns more coherent although still has some disfluency and evidence of perseveration sensation grossly intact, no evidence of extremity spasms on exam.  Moving all extremities spontaneously x4.  Patient left hand dominant and left side strength including shoulder shrug appears to be weaker at 3/5 with right side strength 4/5. Psychiatric: Awake, engaging in appropriate conversation today.  Oriented to name and place but not to date   Assessment/Plan: V. fib arrest due to anterior MI: -s/p cardiac cath- aneurysm proximal LAD with dissection was culprit. -Continue aspirin, Lipitor,Coreg    Acute systolic congestive heart failure with EF 35% 2/2 MI.   Daily weights with strict I's/O- has remained stable from a respiratory standpoint with persistent positive balance; unclear true dry weight noting at time of admission patient weighed 228 pounds with current weight 189.3 pounds Continue Coreg,Entresto Aldactone  and digoxin as above. Echocardiogram was completed 8/18 and anticipate will need to be repeated in November which would be the 3 month mark Last electrolyte panel was on 9/29-repeated on 10/13 and remained stable therefore can repeat every 1 to 3 months unless any clinical changes  Prediabetes diabetes 2 in obese -HgbA1c 5.7 -With underlying systolic heart failure initiated Farxiga 5 mg daily during this admission -CBG stable although at times does have mild hypoglycemia when not eating well   Acute encephalopathy/acute delirium 2/2 anoxic brain injury:   -Had extensive work-up with EEG, MRI brain.   -Initially trach and PEG dependent.  But thus far has been decannulated -Oral intake remains inconsistent therefore PEG remains in place -Continues to have episodes of confusion, agitation as well as amnesia.  -Continue Trileptal as recommended by psychiatry. -Due to lethargy scheduled Vistaril discontinued previously -Also documented  with significant spasticity during PT and OT sessions-Skelaxin 400 mg TID initiated on 10/7-we will not use baclofen at this juncture given CNS effects -SLP working with patient for speech and cognition rehab  Dysphagia -SLP following and patient currently on dysphagia 3 diet but with inconsistent oral intake so recommendation is to continue tube feeding-May need to consider cutting tube feeding volume in order to promote increased appetite  Physical deconditioning 2/2 anoxic brain injury -PT/OT documents slow but continued improvement during therapy sessions.  -10/12 mobility specialist documented patient was able to ambulate with 2 person hand-held assist in chair following from her mother.  Heart rate in the 1 20-1 40 range with ambulation.  Patient was somewhat irritable impulsive during the walk.  To sit several times due to fatigue. -10/12 OT documented patient with increased ability to perform ADLs.  Was lethargic and required extensive assist with grooming while supine.  Nutrition Status: Nutrition Problem: Increased nutrient needs Etiology: post-op healing Signs/Symptoms: estimated needs Interventions: Tube feeding Estimated body mass index is 27.16 kg/m as calculated from the following:   Height as of this encounter: 5\' 10"  (1.778 m).   Weight as of this encounter: 85.9 kg.   Hypokalemia/hypomagnesemia:  -Magnesium slightly low at 1.8 on 9/29 -Started on magnesium 400 mg per tube daily on 10/5 -Pete labs on 10/7   Hypertension:  -  BPs fairly stable in 90s to 100s.  -Entresto dose discontinued for suboptimal blood pressure readings   -Continue Coreg, Aldactone and Entresto.   History of SVT  -Maintaining sinus rhythm with beta-blocker described above   Diarrhea  -Resolved.   ?Aspiration pneumonia  -Has completed completed antibiotics     Data Reviewed: Basic Metabolic Panel: Recent Labs  Lab 11/19/19 0257  NA 139  K 3.9  CL 105  CO2 24  GLUCOSE 110*  BUN 11   CREATININE 0.61  CALCIUM 10.1  MG 2.0  PHOS 5.3*   Liver Function Tests: No results for input(s): AST, ALT, ALKPHOS, BILITOT, PROT, ALBUMIN in the last 168 hours. No results for input(s): LIPASE, AMYLASE in the last 168 hours. No results for input(s): AMMONIA in the last 168 hours. CBC: Recent Labs  Lab 11/19/19 0257  WBC 9.7  NEUTROABS 5.4  HGB 11.6*  HCT 37.6  MCV 82.5  PLT 646*   Cardiac Enzymes: No results for input(s): CKTOTAL, CKMB, CKMBINDEX, TROPONINI in the last 168 hours. BNP (last 3 results) Recent Labs    10/13/19 0440 10/14/19 0500 10/15/19 0552  BNP 186.1* 200.4* 168.9*    ProBNP (last 3 results) No results for input(s): PROBNP in the last 8760 hours.  CBG: Recent Labs  Lab 11/20/19 2132 11/20/19 2353 11/21/19 0354 11/21/19 0931 11/21/19 1214  GLUCAP 145* 98 106* 115* 101*    No results found for this or any previous visit (from the past 240 hour(s)).   Studies: No results found.  Scheduled Meds: . aspirin  81 mg Per Tube Daily  . atorvastatin  80 mg Per Tube Daily  . carvedilol  6.25 mg Per Tube BID WC  . dapagliflozin propanediol  5 mg Oral Daily  . digoxin  0.125 mg Per Tube Daily  . enoxaparin (LOVENOX) injection  40 mg Subcutaneous Q24H  . feeding supplement  237 mL Oral BID BM  . influenza vac split quadrivalent PF  0.5 mL Intramuscular Tomorrow-1000  . insulin aspart  0-15 Units Subcutaneous Q4H  . metaxalone  400 mg Oral TID  . OXcarbazepine  75 mg Per Tube BID  . prenatal multivitamin  1 tablet Per Tube Q1200  . sacubitril-valsartan  1 tablet Per Tube BID  . spironolactone  25 mg Per Tube Daily   Continuous Infusions: . sodium chloride Stopped (11/06/19 1900)    Active Problems:   Cardiac arrest (HCC)   Acute combined systolic and diastolic heart failure (HCC)   Ventricular tachycardia, polymorphic (HCC)   Encounter for central line placement   Respiratory failure (HCC)   Acute metabolic encephalopathy   Anoxic  brain injury [facility])   Dysphagia   Physical deconditioning   Acute delirium   Consultants:  Cardiology  PCCM  Interventional radiology  Neurology  CIR  Psychiatry  Procedures:  8/17 cardiac catheterization  8/18 EEG  8/18 echocardiogram 1. Septal and apical akinesis Distal anterior wall and mid/distal inferior wall hypokinesis Hyperdynamic basal function . Left ventricular ejection fraction, by estimation, is 30 to 35%. The left ventricle has moderately decreased function. The left ventricle demonstrates regional wall motion abnormalities (see scoring diagram/findings for description). The left ventricular internal cavity size was mildly dilated. Left ventricular diastolic parameters are indeterminate. 2. Right ventricular systolic function is normal. The right ventricular size is normal. 3. The mitral valve is normal in structure. Trivial mitral valve regurgitation. No evidence of mitral stenosis. 4. The aortic valve was not well visualized. Aortic valve regurgitation is not  visualized. No aortic stenosis is present. 5. The inferior vena cava is normal in size with greater  8/19 continuous EEG  8/22 EEG  8/31 EEG  8/31 gastrostomy tube placement per interventional radiology  Antibiotics: Anti-infectives (From admission, onward)   Start     Dose/Rate Route Frequency Ordered Stop   10/09/19 2100  vancomycin (VANCOREADY) IVPB 1500 mg/300 mL  Status:  Discontinued        1,500 mg 150 mL/hr over 120 Minutes Intravenous Every 12 hours 10/09/19 1237 10/10/19 1121   10/09/19 0800  ceFEPIme (MAXIPIME) 2 g in sodium chloride 0.9 % 100 mL IVPB        2 g 200 mL/hr over 30 Minutes Intravenous Every 8 hours 10/09/19 0752 10/15/19 2147   10/09/19 0800  vancomycin (VANCOREADY) IVPB 2000 mg/400 mL        2,000 mg 200 mL/hr over 120 Minutes Intravenous  Once 10/09/19 0752 10/09/19 1147   10/07/19 1540  ceFAZolin (ANCEF) 2-4 GM/100ML-% IVPB       Note to Pharmacy: Erie NoeSykes, Ashley    : cabinet override      10/07/19 1540 10/08/19 0344   10/07/19 1400  ceFAZolin (ANCEF) IVPB 2g/100 mL premix        2 g 200 mL/hr over 30 Minutes Intravenous To Radiology 10/06/19 1339 10/07/19 1627   09/30/19 0300  vancomycin (VANCOCIN) IVPB 1000 mg/200 mL premix  Status:  Discontinued        1,000 mg 200 mL/hr over 60 Minutes Intravenous Every 8 hours 09/29/19 1809 10/01/19 0853   09/29/19 1815  ceFEPIme (MAXIPIME) 2 g in sodium chloride 0.9 % 100 mL IVPB        2 g 200 mL/hr over 30 Minutes Intravenous Every 8 hours 09/29/19 1803 10/05/19 1801   09/29/19 1815  vancomycin (VANCOREADY) IVPB 2000 mg/400 mL        2,000 mg 200 mL/hr over 120 Minutes Intravenous  Once 09/29/19 1803 09/29/19 2137   09/24/19 2200  cefTRIAXone (ROCEPHIN) 2 g in sodium chloride 0.9 % 100 mL IVPB        2 g 200 mL/hr over 30 Minutes Intravenous Daily at bedtime 09/24/19 1201 09/28/19 2338   09/24/19 0330  cefTRIAXone (ROCEPHIN) 1 g in sodium chloride 0.9 % 100 mL IVPB  Status:  Discontinued        1 g 200 mL/hr over 30 Minutes Intravenous Daily at bedtime 09/24/19 0245 09/24/19 1201   09/24/19 0300  azithromycin (ZITHROMAX) 500 mg in sodium chloride 0.9 % 250 mL IVPB  Status:  Discontinued        500 mg 250 mL/hr over 60 Minutes Intravenous Daily at bedtime 09/24/19 0245 09/24/19 1103       Time spent: 20    Junious SilkAllison Nevin Grizzle ANP  [facility] Pager [phone]. If 7PM-7AM, please contact night-coverage at [email or web address] 11/21/2019, 12:19 PM  LOS: 58 days

## 2019-11-22 DIAGNOSIS — G931 Anoxic brain damage, not elsewhere classified: Secondary | ICD-10-CM | POA: Diagnosis not present

## 2019-11-22 DIAGNOSIS — D75839 Thrombocytosis, unspecified: Secondary | ICD-10-CM

## 2019-11-22 DIAGNOSIS — I469 Cardiac arrest, cause unspecified: Secondary | ICD-10-CM | POA: Diagnosis not present

## 2019-11-22 LAB — GLUCOSE, CAPILLARY
Glucose-Capillary: 101 mg/dL — ABNORMAL HIGH (ref 70–99)
Glucose-Capillary: 102 mg/dL — ABNORMAL HIGH (ref 70–99)
Glucose-Capillary: 113 mg/dL — ABNORMAL HIGH (ref 70–99)
Glucose-Capillary: 117 mg/dL — ABNORMAL HIGH (ref 70–99)
Glucose-Capillary: 94 mg/dL (ref 70–99)
Glucose-Capillary: 96 mg/dL (ref 70–99)
Glucose-Capillary: 97 mg/dL (ref 70–99)
Glucose-Capillary: 98 mg/dL (ref 70–99)

## 2019-11-22 NOTE — Progress Notes (Signed)
Mobility Specialist - Progress Note   11/22/19 1447  Mobility  Activity Refused mobility    Pt refusing to ambulate today, would not specify why.   Mamie Levers Mobility Specialist Mobility Specialist Phone: [phone]

## 2019-11-22 NOTE — Progress Notes (Signed)
PROGRESS NOTE    KESHARA KIGER  GXQ:[id number] DOB: [birth year] DOA: 09/24/2019 PCP: Patient, No Pcp Per  As per NP Rennis Harding:  34 year old female presents to the hospital 2 weeks postpartum with chest pain and in the ED sustained V. fib arrest with V. fib x3 epi x1, EKG showed ST elevation underwent Cath Lab at [facility] showed aneurysmal dilation(dissection?) of the LAD was identified. She was transferred to [facility] for ICU admission.LVEF30% EF in cath lab.   Echo 8/18>LVEF 30-35%, septal and apical akinesis distal anterior wall an d mid/distal inferior wall hypokinesis hyperdynamic basal function. LV size mildly dilated.  Patient suffered anoxic brain injury. 8.23:MRI brain  8/30 underwent tracheostomy 8/30 PEG tube placement 9/10: Continues to be encephalopathic, able to open eyes and track but not following any commands and not talking,is moving extremities independently 9/15: Tracheostomy downsized to # 4 cufless. 9/20:Trach CAP trial, PCCM following SNF planned for 9/30- 30 days out from trach 9/21:Patient is showing improvement with the speech therapy and PT-recommendation changed from SNF to inpatient rehab.  9/25: Trach removed.     Assessment & Plan:   Active Problems:   Cardiac arrest (HCC)   Acute combined systolic and diastolic heart failure (HCC)   Ventricular tachycardia, polymorphic (HCC)   Encounter for central line placement   Respiratory failure (HCC)   Acute metabolic encephalopathy   Anoxic brain injury (HCC)   Dysphagia   Physical deconditioning   Acute delirium V. fib arrest due to anterior MI: s/p cardiac cath which showed aneurysm proximal LAD w/ dissection. Continue on aspirin, statin & coreg   Acute systolic congestive heart failure: echo shows EF 35%secondary to MI. Monitor I/Os. Continue on coreg, entresto, aldactone, digoxin. Will need a repeat echo in Nov 2021.   Pre-DM: w/ HbA1c 5.7. Continue on farxiga.   Acute encephalopathy:  secondary to anoxic brain injury.Initially trach and PEG dependent, but has since been decannulated. Continue w/ PEG placement. Continue on trileptal as per psych. Vistaril d/c secondary to lethargy. Continue on skelaxin 400mg  TID for spasticity. Will not use baclofen given CNS effects. Continue w/ speech therapy  Dysphagia: continue on dysphagia III as per speech. Continue w/ tube feeds via PEG as oral intake is inconsistent.  Physical deconditioning secondary anoxic brain injury. PT/OT recs SNF  Thrombocytosis: etiology unclear, likely reactive. Will continue to monitor  Hypokalemia: WNL today. Will continue to monitor   Hypomagnesemia: WNL today. Will continue to monitor   HTN: continue on coreg, aldactone, entresto  History of SVT: continue on coreg. Continue on tele   Diarrhea: resolved  Aspiration pneumonia: completed abx course      DVT prophylaxis:  lovenox Code Status: full  Family Communication:  Disposition Plan: likely d/c to SNF  Status is: Inpatient  Remains inpatient appropriate because:Unsafe d/c plan and IV treatments appropriate due to intensity of illness or inability to take PO   Dispo: The patient is from: Home              Anticipated d/c is to: SNF vs CIR, CM is working on this               Anticipated d/c date is: > 3 days              Patient currently is not medically stable to d/c.    Consultants:      Procedures:   Antimicrobials:    Subjective: Pt c/o fatigue. Pt is oriented to person only.  Objective:  Vitals:   11/21/19 2049 11/21/19 2100 11/22/19 0000 11/22/19 0513  BP: 95/65  (!) 113/59 105/63  Pulse: 88 90 72   Resp: 20  18 18   Temp: (!) 97.5 F (36.4 C)   98.8 F (37.1 C)  TempSrc: Oral   Oral  SpO2: 100%  98% 98%  Weight:      Height:       No intake or output data in the 24 hours ending 11/22/19 0715 Filed Weights   11/17/19 0422 11/18/19 0500 11/19/19 0355  Weight: 89.1 kg 86.9 kg 85.9 kg     Examination:  General exam: Appears calm and comfortable  Respiratory system: clear breath sounds b/l. No rales  Cardiovascular system: S1 & S2 +. No  rubs, gallops or clicks. . Gastrointestinal system: Abdomen is nondistended, soft and nontender. Normal bowel sounds heard. Central nervous system: Awake and alert. Oriented to person only  Psychiatry: Judgement and insight appear abnormal. Flat mood and affect.     Data Reviewed: I have personally reviewed following labs and imaging studies  CBC: Recent Labs  Lab 11/19/19 0257  WBC 9.7  NEUTROABS 5.4  HGB 11.6*  HCT 37.6  MCV 82.5  PLT 646*   Basic Metabolic Panel: Recent Labs  Lab 11/19/19 0257  NA 139  K 3.9  CL 105  CO2 24  GLUCOSE 110*  BUN 11  CREATININE 0.61  CALCIUM 10.1  MG 2.0  PHOS 5.3*   GFR: Estimated Creatinine Clearance: 120.3 mL/min (by C-G formula based on SCr of 0.61 mg/dL). Liver Function Tests: No results for input(s): AST, ALT, ALKPHOS, BILITOT, PROT, ALBUMIN in the last 168 hours. No results for input(s): LIPASE, AMYLASE in the last 168 hours. No results for input(s): AMMONIA in the last 168 hours. Coagulation Profile: No results for input(s): INR, PROTIME in the last 168 hours. Cardiac Enzymes: No results for input(s): CKTOTAL, CKMB, CKMBINDEX, TROPONINI in the last 168 hours. BNP (last 3 results) No results for input(s): PROBNP in the last 8760 hours. HbA1C: No results for input(s): HGBA1C in the last 72 hours. CBG: Recent Labs  Lab 11/21/19 1214 11/21/19 1551 11/21/19 2045 11/22/19 0014 11/22/19 0523  GLUCAP 101* 87 132* 113* 102*   Lipid Profile: No results for input(s): CHOL, HDL, LDLCALC, TRIG, CHOLHDL, LDLDIRECT in the last 72 hours. Thyroid Function Tests: No results for input(s): TSH, T4TOTAL, FREET4, T3FREE, THYROIDAB in the last 72 hours. Anemia Panel: No results for input(s): VITAMINB12, FOLATE, FERRITIN, TIBC, IRON, RETICCTPCT in the last 72 hours. Sepsis  Labs: No results for input(s): PROCALCITON, LATICACIDVEN in the last 168 hours.  No results found for this or any previous visit (from the past 240 hour(s)).       Radiology Studies: No results found.      Scheduled Meds: . aspirin  81 mg Per Tube Daily  . atorvastatin  80 mg Per Tube Daily  . carvedilol  6.25 mg Per Tube BID WC  . dapagliflozin propanediol  5 mg Oral Daily  . digoxin  0.125 mg Per Tube Daily  . enoxaparin (LOVENOX) injection  40 mg Subcutaneous Q24H  . feeding supplement  237 mL Oral BID BM  . influenza vac split quadrivalent PF  0.5 mL Intramuscular Tomorrow-1000  . insulin aspart  0-15 Units Subcutaneous Q4H  . metaxalone  400 mg Oral TID  . OXcarbazepine  75 mg Per Tube BID  . prenatal multivitamin  1 tablet Per Tube Q1200  . sacubitril-valsartan  1 tablet Per  Tube BID  . spironolactone  25 mg Per Tube Daily   Continuous Infusions: . sodium chloride Stopped (11/06/19 1900)     LOS: 59 days    Time spent: 30 mins     Charise Killian, MD [facility] Pager [phone]  If 7PM-7AM, please contact night-coverage [email or web address] 11/22/2019, 7:15 AM

## 2019-11-23 DIAGNOSIS — I5041 Acute combined systolic (congestive) and diastolic (congestive) heart failure: Secondary | ICD-10-CM | POA: Diagnosis not present

## 2019-11-23 DIAGNOSIS — G931 Anoxic brain damage, not elsewhere classified: Secondary | ICD-10-CM | POA: Diagnosis not present

## 2019-11-23 DIAGNOSIS — R5381 Other malaise: Secondary | ICD-10-CM | POA: Diagnosis not present

## 2019-11-23 LAB — IRON AND TIBC
Iron: 35 ug/dL (ref 28–170)
Saturation Ratios: 9 % — ABNORMAL LOW (ref 10.4–31.8)
TIBC: 403 ug/dL (ref 250–450)
UIBC: 368 ug/dL

## 2019-11-23 LAB — CBC
HCT: 35.7 % — ABNORMAL LOW (ref 36.0–46.0)
Hemoglobin: 11 g/dL — ABNORMAL LOW (ref 12.0–15.0)
MCH: 25.3 pg — ABNORMAL LOW (ref 26.0–34.0)
MCHC: 30.8 g/dL (ref 30.0–36.0)
MCV: 82.1 fL (ref 80.0–100.0)
Platelets: 616 10*3/uL — ABNORMAL HIGH (ref 150–400)
RBC: 4.35 MIL/uL (ref 3.87–5.11)
RDW: 17.8 % — ABNORMAL HIGH (ref 11.5–15.5)
WBC: 7.4 10*3/uL (ref 4.0–10.5)
nRBC: 0 % (ref 0.0–0.2)

## 2019-11-23 LAB — BASIC METABOLIC PANEL
Anion gap: 12 (ref 5–15)
BUN: 11 mg/dL (ref 6–20)
CO2: 23 mmol/L (ref 22–32)
Calcium: 9.6 mg/dL (ref 8.9–10.3)
Chloride: 104 mmol/L (ref 98–111)
Creatinine, Ser: 0.7 mg/dL (ref 0.44–1.00)
GFR, Estimated: >60 mL/min (ref >60)
Glucose, Bld: 98 mg/dL (ref 70–99)
Potassium: 3.4 mmol/L — ABNORMAL LOW (ref 3.5–5.1)
Sodium: 139 mmol/L (ref 135–145)

## 2019-11-23 LAB — GLUCOSE, CAPILLARY
Glucose-Capillary: 106 mg/dL — ABNORMAL HIGH (ref 70–99)
Glucose-Capillary: 115 mg/dL — ABNORMAL HIGH (ref 70–99)
Glucose-Capillary: 120 mg/dL — ABNORMAL HIGH (ref 70–99)
Glucose-Capillary: 122 mg/dL — ABNORMAL HIGH (ref 70–99)
Glucose-Capillary: 95 mg/dL (ref 70–99)
Glucose-Capillary: 95 mg/dL (ref 70–99)

## 2019-11-23 LAB — RETICULOCYTES
Immature Retic Fract: 29.2 % — ABNORMAL HIGH (ref 2.3–15.9)
RBC.: 4.5 MIL/uL (ref 3.87–5.11)
Retic Count, Absolute: 92.7 10*3/uL (ref 19.0–186.0)
Retic Ct Pct: 2.1 % (ref 0.4–3.1)

## 2019-11-23 LAB — TSH: TSH: 0.694 u[IU]/mL (ref 0.350–4.500)

## 2019-11-23 LAB — FERRITIN: Ferritin: 43 ng/mL (ref 11–307)

## 2019-11-23 LAB — MAGNESIUM: Magnesium: 2 mg/dL (ref 1.7–2.4)

## 2019-11-23 LAB — T4, FREE: Free T4: 0.91 ng/dL (ref 0.61–1.12)

## 2019-11-23 LAB — VITAMIN B12: Vitamin B-12: 2194 pg/mL — ABNORMAL HIGH (ref 180–914)

## 2019-11-23 LAB — FOLATE: Folate: 39.2 ng/mL (ref >5.9)

## 2019-11-23 MED ORDER — PANTOPRAZOLE SODIUM 40 MG PO TBEC
40.0000 mg | DELAYED_RELEASE_TABLET | Freq: Every day | ORAL | Status: DC
  Administered 2019-11-23 – 2019-11-27 (×5): 40 mg via ORAL
  Filled 2019-11-23 (×5): qty 1

## 2019-11-23 MED ORDER — POTASSIUM CHLORIDE CRYS ER 20 MEQ PO TBCR
20.0000 meq | EXTENDED_RELEASE_TABLET | Freq: Once | ORAL | Status: AC
  Administered 2019-11-23: 20 meq via ORAL

## 2019-11-23 MED ORDER — POTASSIUM CHLORIDE 10 MEQ/100ML IV SOLN: 10.0000 meq | Freq: Once | INTRAVENOUS | Status: DC

## 2019-11-23 NOTE — Progress Notes (Signed)
Mobility Specialist - Progress Note   11/23/19 1359  Mobility  Activity Ambulated in hall  Level of Assistance +2 (takes two people)  Assistive Device Four wheel walker (EVA walker)  Distance Ambulated (ft) 460 ft  Mobility Response Tolerated well  Mobility performed by Mobility specialist;Nurse tech;Family member  $Mobility charge 1 Mobility   Pre-mobility: 77 HR During mobility: 115 HR Post-mobility: 69 HR  Pt was able to ambulate to the end of the hallway and back to her room w/o any breaks. Her stability was greatly improved w/ use of the EVA walker compared to past walks w/ hand-held assist. +2 assistance still needed for safety. Chair follow by pt's mother. Pt back in bed after walk.   Beverly [name] Mobility Specialist Mobility Specialist Phone: [phone]

## 2019-11-23 NOTE — Progress Notes (Signed)
PROGRESS NOTE    Beverly [name]  KVQ:[id number] DOB: January 16, 1986 DOA: 09/24/2019 PCP: Patient, No Pcp Per  As per NP Rennis Harding:  34 year old female presents to the hospital 2 weeks postpartum with chest pain and in the ED sustained V. fib arrest with V. fib x3 epi x1, EKG showed ST elevation underwent Cath Lab at [facility] showed aneurysmal dilation(dissection?) of the LAD was identified. She was transferred to [facility] for ICU admission.LVEF30% EF in cath lab.   Echo 8/18>LVEF 30-35%, septal and apical akinesis distal anterior wall an d mid/distal inferior wall hypokinesis hyperdynamic basal function. LV size mildly dilated.  Patient suffered anoxic brain injury. 8.23:MRI brain  8/30 underwent tracheostomy 8/30 PEG tube placement 9/10: Continues to be encephalopathic, able to open eyes and track but not following any commands and not talking,is moving extremities independently 9/15: Tracheostomy downsized to # 4 cufless. 9/20:Trach CAP trial, PCCM following SNF planned for 9/30- 30 days out from trach 9/21:Patient is showing improvement with the speech therapy and PT-recommendation changed from SNF to inpatient rehab.  9/25: Trach removed.  Assessment & Plan:   Active Problems:   Cardiac arrest (HCC)   Acute combined systolic and diastolic heart failure (HCC)   Ventricular tachycardia, polymorphic (HCC)   Encounter for central line placement   Respiratory failure (HCC)   Acute metabolic encephalopathy   Anoxic brain injury (HCC)   Dysphagia   Physical deconditioning   Acute delirium V. fib arrest due to anterior MI: s/p cardiac cath which showed aneurysm proximal LAD w/ dissection. Continue on aspirin, statin & coreg. No change in regimen. Pt is stable.  Acute systolic congestive heart failure: echo shows EF 35%secondary to MI. Monitor I/Os. Continue on coreg, entresto, aldactone, digoxin. Will need a repeat echo in Nov 2021.  Pre-DM: w/ HbA1c 5.7. Continue on farxiga.     Acute encephalopathy: secondary to anoxic brain injury.Initially trach and PEG dependent, but has since been decannulated. Continue w/ PEG placement. Continue on trileptal as per psych. Vistaril d/c secondary to lethargy. Continue on skelaxin 400mg  TID for spasticity. Will not use baclofen given CNS effects. Continue w/ speech therapy. Pt is alert,awake and oriented and responds to mom and answers questions.   Dysphagia: continue on dysphagia III as per speech. Continue w/ tube feeds via PEG as oral intake is inconsistent.  Physical deconditioning secondary anoxic brain injury. PT/OT recs SNF  Anemia /Thrombocytosis: etiology unclear, likely reactive. Will continue to monitor Iron studies reflect ACD.  Hypokalemia: replace today and  continue to monitor .  Hypomagnesemia: WNL today. Will continue to monitor .  HTN: continue on coreg, aldactone, entresto   DVT prophylaxis:  lovenox Code Status: full  Family Communication:  Disposition Plan: likely d/c to SNF  Status is: Inpatient  Remains inpatient appropriate because:Unsafe d/c plan and IV treatments appropriate due to intensity of illness or inability to take PO   Dispo: The patient is from: Home              Anticipated d/c is to: SNF vs CIR, CM is working on this               Anticipated d/c date is: > 3 days              Patient currently is not medically stable to d/c.    Consultants:   PCCM: .  Neurology: Joneen Roach.   Procedures:  09/23/2019: Intubation.  09/23/2019: Cardiac Catherization.  09/24/2019: Echocardiogram  09/24/2019:  EEG   Antimicrobials:  Anti-infectives (From admission, onward)   Start     Dose/Rate Route Frequency Ordered Stop   10/09/19 2100  vancomycin (VANCOREADY) IVPB 1500 mg/300 mL  Status:  Discontinued        1,500 mg 150 mL/hr over 120 Minutes Intravenous Every 12 hours 10/09/19 1237 10/10/19 1121   10/09/19 0800  ceFEPIme (MAXIPIME) 2 g in sodium chloride  0.9 % 100 mL IVPB        2 g 200 mL/hr over 30 Minutes Intravenous Every 8 hours 10/09/19 0752 10/15/19 2147   10/09/19 0800  vancomycin (VANCOREADY) IVPB 2000 mg/400 mL        2,000 mg 200 mL/hr over 120 Minutes Intravenous  Once 10/09/19 0752 10/09/19 1147   10/07/19 1540  ceFAZolin (ANCEF) 2-4 GM/100ML-% IVPB       Note to Pharmacy: Erie Noe   : cabinet override      10/07/19 1540 10/08/19 0344   10/07/19 1400  ceFAZolin (ANCEF) IVPB 2g/100 mL premix        2 g 200 mL/hr over 30 Minutes Intravenous To Radiology 10/06/19 1339 10/07/19 1627   09/30/19 0300  vancomycin (VANCOCIN) IVPB 1000 mg/200 mL premix  Status:  Discontinued        1,000 mg 200 mL/hr over 60 Minutes Intravenous Every 8 hours 09/29/19 1809 10/01/19 0853   09/29/19 1815  ceFEPIme (MAXIPIME) 2 g in sodium chloride 0.9 % 100 mL IVPB        2 g 200 mL/hr over 30 Minutes Intravenous Every 8 hours 09/29/19 1803 10/05/19 1801   09/29/19 1815  vancomycin (VANCOREADY) IVPB 2000 mg/400 mL        2,000 mg 200 mL/hr over 120 Minutes Intravenous  Once 09/29/19 1803 09/29/19 2137   09/24/19 2200  cefTRIAXone (ROCEPHIN) 2 g in sodium chloride 0.9 % 100 mL IVPB        2 g 200 mL/hr over 30 Minutes Intravenous Daily at bedtime 09/24/19 1201 09/28/19 2338   09/24/19 0330  cefTRIAXone (ROCEPHIN) 1 g in sodium chloride 0.9 % 100 mL IVPB  Status:  Discontinued        1 g 200 mL/hr over 30 Minutes Intravenous Daily at bedtime 09/24/19 0245 09/24/19 1201   09/24/19 0300  azithromycin (ZITHROMAX) 500 mg in sodium chloride 0.9 % 250 mL IVPB  Status:  Discontinued        500 mg 250 mL/hr over 60 Minutes Intravenous Daily at bedtime 09/24/19 0245 09/24/19 1103       Subjective: Pt c/o fatigue. Pt is oriented to person only.  11/23/2019 Chart reviewed for past day notes and labs and imaging studies , blood cultures, micro,ID. Labs shows c/h normocytic anemia with elevated RDW and thrombocytosis.  Mom at bedside and has questions  about disability and d/w her that I would recommend she ask case manager for details on questions about disability.Mom verbalized understanding.   Objective: Vitals:   11/22/19 1606 11/22/19 1945 11/23/19 0013 11/23/19 0459  BP: 108/79 103/70 114/63 111/74  Pulse: 81 86 84 74  Resp: 18 18 18 18   Temp: 98 F (36.7 C) 98 F (36.7 C) 97.9 F (36.6 C) 98 F (36.7 C)  TempSrc: Oral Oral Oral Oral  SpO2: 98% 96% 98% 96%  Weight:    87.8 kg  Height:        Intake/Output Summary (Last 24 hours) at 11/23/2019 0728 Last data filed at 11/22/2019 1900 Gross per 24 hour  Intake 200 ml  Output --  Net 200 ml   Filed Weights   11/18/19 0500 11/19/19 0355 11/23/19 0459  Weight: 86.9 kg 85.9 kg 87.8 kg   Blood pressure 111/74, pulse 74, temperature 98 F (36.7 C), temperature source Oral, resp. rate 18, height 5\' 10"  (1.778 m), weight 87.8 kg, SpO2 96 %, unknown if currently breastfeeding. Examination: General exam: Appears calm and comfortable  Respiratory system: clear breath sounds b/l. No rales  Cardiovascular system: S1 & S2 +. No  rubs, gallops or clicks. . Gastrointestinal system: Abdomen is nondistended, soft and nontender. Normal bowel sounds heard. Central nervous system: Awake and alert. Oriented to person only  Psychiatry: Judgement and insight appear abnormal. Flat mood and affect.  Data Reviewed: I have personally reviewed following labs and imaging studies  CBC: Recent Labs  Lab 11/19/19 0257 11/23/19 0111  WBC 9.7 7.4  NEUTROABS 5.4  --   HGB 11.6* 11.0*  HCT 37.6 35.7*  MCV 82.5 82.1  PLT 646* 616*   Basic Metabolic Panel: Recent Labs  Lab 11/19/19 0257 11/23/19 0111  NA 139 139  K 3.9 3.4*  CL 105 104  CO2 24 23  GLUCOSE 110* 98  BUN 11 11  CREATININE 0.61 0.70  CALCIUM 10.1 9.6  MG 2.0  --   PHOS 5.3*  --    GFR: Estimated Creatinine Clearance: 121.4 mL/min (by C-G formula based on SCr of 0.7 mg/dL). CBG: Recent Labs  Lab 11/22/19 1601  11/22/19 1710 11/22/19 1948 11/22/19 2356 11/23/19 0457  GLUCAP 94 98 117* 96 106*    Intake/Output Summary (Last 24 hours) at 11/23/2019 0742 Last data filed at 11/22/2019 1900 Gross per 24 hour  Intake 200 ml  Output --  Net 200 ml   I/O last 3 completed shifts: In: 200 [P.O.:200] Out: -  No intake/output data recorded.  Radiology Studies: No results found.  Scheduled Meds: . aspirin  81 mg Per Tube Daily  . atorvastatin  80 mg Per Tube Daily  . carvedilol  6.25 mg Per Tube BID WC  . dapagliflozin propanediol  5 mg Oral Daily  . digoxin  0.125 mg Per Tube Daily  . enoxaparin (LOVENOX) injection  40 mg Subcutaneous Q24H  . feeding supplement  237 mL Oral BID BM  . influenza vac split quadrivalent PF  0.5 mL Intramuscular Tomorrow-1000  . insulin aspart  0-15 Units Subcutaneous Q4H  . metaxalone  400 mg Oral TID  . OXcarbazepine  75 mg Per Tube BID  . prenatal multivitamin  1 tablet Per Tube Q1200  . sacubitril-valsartan  1 tablet Per Tube BID  . spironolactone  25 mg Per Tube Daily   Continuous Infusions: . sodium chloride Stopped (11/06/19 1900)     LOS: 60 days   Time spent: 45 mins   11/08/19, MD [facility] Pager [phone]  If 7PM-7AM, please contact night-coverage [email or web address] 11/23/2019, 7:28 AM

## 2019-11-24 DIAGNOSIS — I469 Cardiac arrest, cause unspecified: Secondary | ICD-10-CM | POA: Diagnosis not present

## 2019-11-24 DIAGNOSIS — R5381 Other malaise: Secondary | ICD-10-CM | POA: Diagnosis not present

## 2019-11-24 DIAGNOSIS — G931 Anoxic brain damage, not elsewhere classified: Secondary | ICD-10-CM | POA: Diagnosis not present

## 2019-11-24 LAB — GLUCOSE, CAPILLARY
Glucose-Capillary: 106 mg/dL — ABNORMAL HIGH (ref 70–99)
Glucose-Capillary: 103 mg/dL — ABNORMAL HIGH (ref 70–99)
Glucose-Capillary: 108 mg/dL — ABNORMAL HIGH (ref 70–99)
Glucose-Capillary: 117 mg/dL — ABNORMAL HIGH (ref 70–99)
Glucose-Capillary: 95 mg/dL (ref 70–99)

## 2019-11-24 NOTE — Progress Notes (Signed)
Mobility Specialist - Progress Note   11/24/19 1659  Mobility  Activity Ambulated in hall  Level of Assistance +2 (takes two people)  Assistive Device Four wheel walker (EVA walker)  Distance Ambulated (ft) 450 ft (150 ft x 3)  Mobility Response Tolerated well  Mobility performed by Mobility specialist  $Mobility charge 1 Mobility    Pre-mobility: 80 HR Post-mobility: 85 HR  Pt ambulated w/ use of EVA walker and +2 assist for safety. Chair follow by nurse student. She required 2 seated rest breaks due to fatigue. ECG leads came off while ambulating, could not see HR during ambulation. Pt back in bed w/ mother in room.   Mamie Levers Mobility Specialist Mobility Specialist Phone: [phone]

## 2019-11-24 NOTE — TOC Progression Note (Signed)
Transition of Care [facility]) - Progression Note    Patient Details  Name: MALIKAH PRINCIPATO MRN: [id number] Date of Birth: 09/14/1985  Transition of Care [facility] [address] CM/SW Contact  Okey Dupre Lazaro Arms, LCSW Phone Number: 11/24/2019, 4:58 PM  Clinical Narrative:   Per handoff, referral sent to [facility] and requested [facility] confirm bed offer. Call made to [facility] (11:32 am) with [facility] [place] (also covering [facility]) and message left. Attempted to reach [place] later this afternoon (5:00 pm) and got her voice mail.   11:46 am: Call made to [facility] and spoke with Marylene Land in admissions regarding patient. CSW advised by Marylene Land that she would get in touch with Jacki Cones and have her call CSW regarding patient. CSW attempted to [facility] at 2:54 pm and got her VM, which is full. Follow-up will need to be made on Tuesday with both facilities..     Expected Discharge Plan: IP Rehab Facility Barriers to Discharge: Continued Medical Work up, SNF Pending Medicaid, SNF Pending bed offer  Expected Discharge Plan and Services Expected Discharge Plan: IP Rehab Facility In-house Referral: Clinical Social Work   Post Acute Care Choice: IP Rehab                                         Social Determinants of Health (SDOH) Interventions    Readmission Risk Interventions No flowsheet data found.

## 2019-11-24 NOTE — Progress Notes (Signed)
[facility] PROGRESS NOTE  Beverly [name] OZH:[id number] DOB: Sep 22, 1985 DOA: 09/24/2019 PCP: Patient, No Pcp Per  Status: Inpatient-Remains inpatient appropriate because:Altered mental status, Ongoing diagnostic testing needed not appropriate for outpatient work up, Unsafe d/c plan, IV treatments appropriate due to intensity of illness or inability to take PO and Inpatient level of care appropriate due to severity of illness   Dispo: The patient is from: Home              Anticipated d/c is to: SNF vs inpatient rehabilitation at outside facility              Anticipated d/c date is: > 3 days              Patient currently is not medically stable to d/c. 2/2 UNSAFE DC PLAN. PT and OT have seen patient and recommended inpatient rehabilitation vs SNF placement.  Case has been reviewed by [facility] and they felt they could not provide the level of care the patient needed.  Other local inpatient rehab options are being explored.  Unfortunately [facility] is not "in network" for patient's insurance coverage.  Medicaid application is also pending.  10/5 [facility] in [place] reviewing clinicals family hopeful for local facility with focus on brain injury rehab  **If unable to locate an inpatient rehabilitation facility need to consider discharge to a SNF with focus on management of brain injury**  Code Status: Full Family Communication: Mother at bedside 10/13 DVT prophylaxis: Lovenox Vaccination status: Has not received Covid vaccine  HPI: 34 year old female presents to the hospital 2 weeks postpartum with chest pain and in the ED sustained V. fib arrest with V. fib x3 epi x1, EKG showed ST elevation underwent Cath Lab at [facility] showed aneurysmal dilation(dissection?) of the LAD was identified. She was transferred to [facility] for ICU admission. LVEF 30% EF in cath lab.    Significant events Echo 8/18> LVEF 30-35%, septal and apical akinesis distal anterior wall an d mid/distal  inferior wall hypokinesis hyperdynamic basal function. LV size mildly dilated.  Patient suffered anoxic brain injury. 8.23:MRI brain  8/30 underwent tracheostomy 8/30 PEG tube placement 9/10: Continues to be encephalopathic, able to open eyes and track but not following any commands and not talking,is moving extremities independently 9/15: Tracheostomy downsized to # 4 cufless. 9/20:Trach CAP trial, PCCM following SNF planned for 9/30- 30 days out from trach 9/21:Patient is showing improvement with the speech therapy and PT-recommendation changed from SNF to inpatient rehab.  9/25: Trach removed.  Subjective: Patient awake and alert.  Smiled when I mentioned how well she had done with PT the previous day.  Had questions regarding disability application and different adaptive devices including her upper extremity splints.  Objective: Vitals:   11/24/19 0805 11/24/19 1128  BP: 128/73 (!) 110/59  Pulse: 92 92  Resp: 15 19  Temp: 98.2 F (36.8 C) 98.2 F (36.8 C)  SpO2: 100% 96%    Intake/Output Summary (Last 24 hours) at 11/24/2019 1316 Last data filed at 11/24/2019 1000 Gross per 24 hour  Intake 440 ml  Output --  Net 440 ml   Filed Weights   11/19/19 0355 11/23/19 0459 11/24/19 0538  Weight: 85.9 kg 87.8 kg 85.5 kg    Exam:  Constitutional: NAD, calm, comfortable  Respiratory: clear to auscultation bilaterally. Normal respiratory effort. Room air Cardiovascular: Regular rate and rhythm, no murmurs / rubs / gallops. No extremity edema.  Abdomen: no tenderness, no masses palpated. Bowel  sounds positive.  PEG tube in place tolerating solid diet although in small portions at 50 - 75% of meals Musculoskeletal: no clubbing / cyanosis. No contractures. Normal muscle tone.  Skin: no rashes, lesions, ulcers. No induration Neurologic: CN 2-12 grossly intact, speech patterns more coherent although still has some disfluency and evidence of perseveration sensation grossly intact, no  evidence of extremity spasms on exam.  Moving all extremities spontaneously x4.  Patient left hand dominant and left side strength including shoulder shrug appears to be weaker at 3/5 with right side strength 4/5. Psychiatric: Awake, engaging in appropriate conversation today.  Oriented to name and place    Assessment/Plan: V. fib arrest due to anterior MI: -s/p cardiac cath- aneurysm proximal LAD with dissection was culprit. -Continue aspirin, Lipitor,Coreg    Acute systolic congestive heart failure with EF 35% 2/2 MI.   Daily weights with strict I's/O- has remained stable from a respiratory standpoint with persistent positive balance; unclear true dry weight noting at time of admission patient weighed 228 pounds with current weight 189.3 pounds Continue Coreg,Entresto Aldactone  and digoxin as above. Echocardiogram was completed 8/18 and anticipate will need to be repeated in November which would be the 3 month mark Last electrolyte panel was on 9/29-repeated on 10/13 and remained stable therefore can repeat every 1 to 3 months unless any clinical changes  Prediabetes diabetes 2 in obese -HgbA1c 5.7 -With underlying systolic heart failure initiated Farxiga 5 mg daily during this admission -CBG stable although at times does have mild hypoglycemia when not eating well   Acute encephalopathy/acute delirium 2/2 anoxic brain injury:   -Had extensive work-up with EEG, MRI brain.   -Initially trach and PEG dependent.  But thus far has been decannulated -Oral intake remains inconsistent therefore PEG remains in place -Continues to have episodes of confusion, agitation as well as amnesia.  -Continue Trileptal as recommended by psychiatry. -Due to lethargy scheduled Vistaril discontinued previously -Also documented with significant spasticity during PT and OT sessions-Skelaxin 400 mg TID initiated on 10/7-we will not use baclofen at this juncture given CNS effects -SLP working with patient for  speech and cognition rehab  Dysphagia -SLP following and patient currently on dysphagia 3 diet but with inconsistent oral intake so recommendation is to continue tube feeding-May need to consider cutting tube feeding volume in order to promote increased appetite  Physical deconditioning 2/2 anoxic brain injury -PT/OT documents slow but continued improvement during therapy sessions.  -10/12 mobility specialist documented patient was able to ambulate with 2 person hand-held assist in chair following from her mother.  Heart rate in the 1 20-1 40 range with ambulation.  Patient was somewhat irritable impulsive during the walk.  To sit several times due to fatigue. -10/12 OT documented patient with increased ability to perform ADLs.  Was lethargic and required extensive assist with grooming while supine. -OT in process of fitting splints to upper extremities and once fit confirmed will develop a schedule of application to give to nurses -Mother also concerned about patient needing adaptive devices to assist in independent management of eating utensils.  Message sent to OT.  In the interim I instructed nursing staff to wrap washcloths around eating utensils and secure with tape-this may help patient hold utensils better.  Nutrition Status: Nutrition Problem: Increased nutrient needs Etiology: post-op healing Signs/Symptoms: estimated needs Interventions: Tube feeding Estimated body mass index is 27.05 kg/m as calculated from the following:   Height as of this encounter: 5\' 10"  (1.778 m).  Weight as of this encounter: 85.5 kg.   Hypokalemia/hypomagnesemia:  -Magnesium slightly low at 1.8 on 9/29 -Started on magnesium 400 mg per tube daily on 10/5 -Pete labs on 10/7   Hypertension:  -BPs fairly stable in 90s to 100s.  -Entresto dose discontinued for suboptimal blood pressure readings   -Continue Coreg, Aldactone and Entresto.   History of SVT  -Maintaining sinus rhythm with beta-blocker  described above   Diarrhea  -Resolved.   ?Aspiration pneumonia  -Has completed completed antibiotics     Data Reviewed: Basic Metabolic Panel: Recent Labs  Lab 11/19/19 0257 11/23/19 0111 11/23/19 1042  NA 139 139  --   K 3.9 3.4*  --   CL 105 104  --   CO2 24 23  --   GLUCOSE 110* 98  --   BUN 11 11  --   CREATININE 0.61 0.70  --   CALCIUM 10.1 9.6  --   MG 2.0  --  2.0  PHOS 5.3*  --   --    Liver Function Tests: No results for input(s): AST, ALT, ALKPHOS, BILITOT, PROT, ALBUMIN in the last 168 hours. No results for input(s): LIPASE, AMYLASE in the last 168 hours. No results for input(s): AMMONIA in the last 168 hours. CBC: Recent Labs  Lab 11/19/19 0257 11/23/19 0111  WBC 9.7 7.4  NEUTROABS 5.4  --   HGB 11.6* 11.0*  HCT 37.6 35.7*  MCV 82.5 82.1  PLT 646* 616*   Cardiac Enzymes: No results for input(s): CKTOTAL, CKMB, CKMBINDEX, TROPONINI in the last 168 hours. BNP (last 3 results) Recent Labs    10/13/19 0440 10/14/19 0500 10/15/19 0552  BNP 186.1* 200.4* 168.9*    ProBNP (last 3 results) No results for input(s): PROBNP in the last 8760 hours.  CBG: Recent Labs  Lab 11/23/19 2057 11/23/19 2356 11/24/19 0544 11/24/19 0802 11/24/19 1209  GLUCAP 115* 122* 106* 103* 108*    No results found for this or any previous visit (from the past 240 hour(s)).   Studies: No results found.  Scheduled Meds: . aspirin  81 mg Per Tube Daily  . atorvastatin  80 mg Per Tube Daily  . carvedilol  6.25 mg Per Tube BID WC  . dapagliflozin propanediol  5 mg Oral Daily  . digoxin  0.125 mg Per Tube Daily  . enoxaparin (LOVENOX) injection  40 mg Subcutaneous Q24H  . feeding supplement  237 mL Oral BID BM  . influenza vac split quadrivalent PF  0.5 mL Intramuscular Tomorrow-1000  . insulin aspart  0-15 Units Subcutaneous Q4H  . metaxalone  400 mg Oral TID  . OXcarbazepine  75 mg Per Tube BID  . pantoprazole  40 mg Oral Daily  . prenatal multivitamin  1  tablet Per Tube Q1200  . sacubitril-valsartan  1 tablet Per Tube BID  . spironolactone  25 mg Per Tube Daily   Continuous Infusions: . sodium chloride Stopped (11/06/19 1900)    Active Problems:   Cardiac arrest (HCC)   Acute combined systolic and diastolic heart failure (HCC)   Ventricular tachycardia, polymorphic (HCC)   Encounter for central line placement   Respiratory failure (HCC)   Acute metabolic encephalopathy   Anoxic brain injury [facility](HCC)   Dysphagia   Physical deconditioning   Acute delirium   Consultants:  Cardiology  PCCM  Interventional radiology  Neurology  CIR  Psychiatry  Procedures:  8/17 cardiac catheterization  8/18 EEG  8/18 echocardiogram 1. Septal and apical akinesis  Distal anterior wall and mid/distal inferior wall hypokinesis Hyperdynamic basal function . Left ventricular ejection fraction, by estimation, is 30 to 35%. The left ventricle has moderately decreased function. The left ventricle demonstrates regional wall motion abnormalities (see scoring diagram/findings for description). The left ventricular internal cavity size was mildly dilated. Left ventricular diastolic parameters are indeterminate. 2. Right ventricular systolic function is normal. The right ventricular size is normal. 3. The mitral valve is normal in structure. Trivial mitral valve regurgitation. No evidence of mitral stenosis. 4. The aortic valve was not well visualized. Aortic valve regurgitation is not visualized. No aortic stenosis is present. 5. The inferior vena cava is normal in size with greater  8/19 continuous EEG  8/22 EEG  8/31 EEG  8/31 gastrostomy tube placement per interventional radiology  Antibiotics: Anti-infectives (From admission, onward)   Start     Dose/Rate Route Frequency Ordered Stop   10/09/19 2100  vancomycin (VANCOREADY) IVPB 1500 mg/300 mL  Status:  Discontinued        1,500 mg 150 mL/hr over 120 Minutes Intravenous Every 12 hours  10/09/19 1237 10/10/19 1121   10/09/19 0800  ceFEPIme (MAXIPIME) 2 g in sodium chloride 0.9 % 100 mL IVPB        2 g 200 mL/hr over 30 Minutes Intravenous Every 8 hours 10/09/19 0752 10/15/19 2147   10/09/19 0800  vancomycin (VANCOREADY) IVPB 2000 mg/400 mL        2,000 mg 200 mL/hr over 120 Minutes Intravenous  Once 10/09/19 0752 10/09/19 1147   10/07/19 1540  ceFAZolin (ANCEF) 2-4 GM/100ML-% IVPB       Note to Pharmacy: Erie Noe   : cabinet override      10/07/19 1540 10/08/19 0344   10/07/19 1400  ceFAZolin (ANCEF) IVPB 2g/100 mL premix        2 g 200 mL/hr over 30 Minutes Intravenous To Radiology 10/06/19 1339 10/07/19 1627   09/30/19 0300  vancomycin (VANCOCIN) IVPB 1000 mg/200 mL premix  Status:  Discontinued        1,000 mg 200 mL/hr over 60 Minutes Intravenous Every 8 hours 09/29/19 1809 10/01/19 0853   09/29/19 1815  ceFEPIme (MAXIPIME) 2 g in sodium chloride 0.9 % 100 mL IVPB        2 g 200 mL/hr over 30 Minutes Intravenous Every 8 hours 09/29/19 1803 10/05/19 1801   09/29/19 1815  vancomycin (VANCOREADY) IVPB 2000 mg/400 mL        2,000 mg 200 mL/hr over 120 Minutes Intravenous  Once 09/29/19 1803 09/29/19 2137   09/24/19 2200  cefTRIAXone (ROCEPHIN) 2 g in sodium chloride 0.9 % 100 mL IVPB        2 g 200 mL/hr over 30 Minutes Intravenous Daily at bedtime 09/24/19 1201 09/28/19 2338   09/24/19 0330  cefTRIAXone (ROCEPHIN) 1 g in sodium chloride 0.9 % 100 mL IVPB  Status:  Discontinued        1 g 200 mL/hr over 30 Minutes Intravenous Daily at bedtime 09/24/19 0245 09/24/19 1201   09/24/19 0300  azithromycin (ZITHROMAX) 500 mg in sodium chloride 0.9 % 250 mL IVPB  Status:  Discontinued        500 mg 250 mL/hr over 60 Minutes Intravenous Daily at bedtime 09/24/19 0245 09/24/19 1103       Time spent: 20    Junious Silk ANP  [facility] Pager [phone]. If 7PM-7AM, please contact night-coverage at [email or web address] 11/24/2019, 1:16 PM  LOS: 61 days

## 2019-11-24 NOTE — Progress Notes (Signed)
Physical Therapy Treatment Patient Details Name: Beverly [name] MRN: [id number] DOB: 1985/10/15 Today's Date: 11/24/2019    History of Present Illness Pt is a 34 y.o. female admitted 09/23/19 with intermittent chest discomfort; of note, pt 2-weeks post-partum (had emergent C-section); while in ED, pt with VF cardiac arrest requiring intubation. Brain MRI 8/23 with no acute abnormality. S/p trach and PEG tube 8/30. Course complicated by encephalopathy, anoxic brain injury, enterobacterial PNA. Transferred out of ICU On 9/4. PMH includes HTN.    PT Comments    Pt admitted with above diagnosis. Pt was able to ambulate with need of EVa walker and mod assist +2 and 3rd person to follow with chair.  Pt is significantly limited by impulsivity and poor safety awareness needing cues for postural stability as well for cognitive deficits.  Needs continued PT.  Pt currently with functional limitations due to balance and endurance deficits. Pt will benefit from skilled PT to increase their independence and safety with mobility to allow discharge to the venue listed below.     Follow Up Recommendations  CIR;Supervision/Assistance - 24 hour     Equipment Recommendations  Wheelchair (measurements PT);Hospital bed    Recommendations for Other Services OT consult     Precautions / Restrictions Precautions Precautions: Fall;Other (comment) Precaution Comments: impulsive, incontinence- wears diapers  Restrictions Weight Bearing Restrictions: No    Mobility  Bed Mobility Overal bed mobility: Needs Assistance Bed Mobility: Supine to Sit;Sit to Supine Rolling: Mod assist   Supine to sit: Min guard Sit to supine: Min guard   General bed mobility comments: Pt had BM in diaper and PT changed pt prior to getting up.  Also put pts pants on and her shoes.  Assist for safety but no physical assist  Transfers Overall transfer level: Needs assistance Equipment used:  (EVa walker) Transfers: Sit to/from  Stand Sit to Stand: Mod assist;Min assist;+2 physical assistance         General transfer comment: Assist to bring hips up and for trunk control with varying ammounts of assist needed as pts impulsivity affects her safety and stability with transitions.    Ambulation/Gait Ambulation/Gait assistance: Mod assist;+2 physical assistance (3rd person for chair follow) Gait Distance (Feet): 375 Feet (200 feet, 175 feet) Assistive device:  (Eva walker) Gait Pattern/deviations: Decreased step length - right;Decreased step length - left;Leaning posteriorly;Trunk flexed;Wide base of support;Step-through pattern;Ataxic;Decreased stride length;Decreased dorsiflexion - right Gait velocity: decr Gait velocity interpretation: <1.8 ft/sec, indicate of risk for recurrent falls General Gait Details:  Assist for trunk control and assist to steer, move walker. Knees with decr control as pt fatigued . Incr trunk sway in all directions.  Needed tech to stand in front to slow the [place] walker down as pt pushes it too fast unless controlled by therapy staff.  Pt needs constant cues to look up as well as to slow down. Pt needing cues for postural stability as well. Pt with at least 5 second delay in following commands.  Pt also lacks awareness of deficits with signifcant impulsivity with all movements.    Stairs             Wheelchair Mobility    Modified Rankin (Stroke Patients Only)       Balance Overall balance assessment: Needs assistance Sitting-balance support: No upper extremity supported;Feet supported Sitting balance-Leahy Scale: Poor Sitting balance - Comments: Sat EOB with min guard. No loss of balance however needs cues as she will try to stand prior to PT wanting  her to stand.  Postural control: Posterior lean Standing balance support: Bilateral upper extremity supported Standing balance-Leahy Scale: Poor Standing balance comment: Stood with EVa  walker with mod assist of 2 persons                             Cognition Arousal/Alertness: Awake/alert Behavior During Therapy: Impulsive Overall Cognitive Status: Impaired/Different from baseline Area of Impairment: Attention;Memory;Safety/judgement;Following commands;Awareness;Problem solving;Orientation               Rancho Levels of Cognitive Functioning Ranch[place] Mirant Scales of Cognitive Functioning: Confused/inappropriate/non-agitated Orientation Level: Disoriented to;Time Current Attention Level: Sustained Memory: Decreased recall of precautions;Decreased short-term memory Following Commands: Follows one step commands with increased time Safety/Judgement: Decreased awareness of safety;Decreased awareness of deficits Awareness: Intellectual Problem Solving: Slow processing;Difficulty sequencing;Requires verbal cues;Requires tactile cues        Exercises      General Comments General comments (skin integrity, edema, etc.): VSS      Pertinent Vitals/Pain Pain Assessment: No/denies pain    Home Living                      Prior Function            PT Goals (current goals can now be found in the care plan section) Progress towards PT goals: Progressing toward goals    Frequency    Min 3X/week      PT Plan Current plan remains appropriate    Co-evaluation              AM-PAC PT "6 Clicks" Mobility   Outcome Measure  Help needed turning from your back to your side while in a flat bed without using bedrails?: A Little Help needed moving from lying on your back to sitting on the side of a flat bed without using bedrails?: A Little Help needed moving to and from a bed to a chair (including a wheelchair)?: A Lot Help needed standing up from a chair using your arms (e.g., wheelchair or bedside chair)?: A Lot Help needed to walk in hospital room?: A Lot Help needed climbing 3-5 steps with a railing? : Total 6 Click Score: 13    End of Session Equipment Utilized  During Treatment: Gait belt Activity Tolerance: Patient limited by fatigue Patient left: in bed;with call bell/phone within reach;with family/visitor present;with bed alarm set Nurse Communication: Mobility status PT Visit Diagnosis: Other abnormalities of gait and mobility (R26.89);Difficulty in walking, not elsewhere classified (R26.2);Other symptoms and signs involving the nervous system (R29.898)     Time: 2774-1287 PT Time Calculation (min) (ACUTE ONLY): 33 min  Charges:  $Gait Training: 23-37 mins                     Elen Acero W,PT Acute Rehabilitation Services Pager:  [phone]  Office:  [phone]     Berline Lopes 11/24/2019, 4:02 PM

## 2019-11-25 DIAGNOSIS — R1310 Dysphagia, unspecified: Secondary | ICD-10-CM | POA: Diagnosis not present

## 2019-11-25 DIAGNOSIS — G931 Anoxic brain damage, not elsewhere classified: Secondary | ICD-10-CM | POA: Diagnosis not present

## 2019-11-25 DIAGNOSIS — I469 Cardiac arrest, cause unspecified: Secondary | ICD-10-CM | POA: Diagnosis not present

## 2019-11-25 DIAGNOSIS — R5381 Other malaise: Secondary | ICD-10-CM | POA: Diagnosis not present

## 2019-11-25 LAB — GLUCOSE, CAPILLARY
Glucose-Capillary: 105 mg/dL — ABNORMAL HIGH (ref 70–99)
Glucose-Capillary: 81 mg/dL (ref 70–99)
Glucose-Capillary: 84 mg/dL (ref 70–99)
Glucose-Capillary: 87 mg/dL (ref 70–99)
Glucose-Capillary: 97 mg/dL (ref 70–99)
Glucose-Capillary: 99 mg/dL (ref 70–99)

## 2019-11-25 MED ORDER — OXCARBAZEPINE 150 MG PO TABS
75.0000 mg | ORAL_TABLET | Freq: Every morning | ORAL | Status: DC
  Administered 2019-11-26 – 2019-11-28 (×3): 75 mg
  Filled 2019-11-25 (×3): qty 0.5

## 2019-11-25 MED ORDER — OXCARBAZEPINE 150 MG PO TABS: 150.0000 mg | ORAL_TABLET | Freq: Two times a day (BID) | ORAL | Status: DC

## 2019-11-25 MED ORDER — FREE WATER
200.0000 mL | Freq: Four times a day (QID) | Status: DC
  Administered 2019-11-25 – 2019-11-28 (×11): 200 mL

## 2019-11-25 MED ORDER — OXCARBAZEPINE 150 MG PO TABS
150.0000 mg | ORAL_TABLET | Freq: Every day | ORAL | Status: DC
  Administered 2019-11-25 – 2019-11-27 (×3): 150 mg
  Filled 2019-11-25 (×4): qty 1

## 2019-11-25 MED ORDER — METAXALONE 800 MG PO TABS
400.0000 mg | ORAL_TABLET | Freq: Two times a day (BID) | ORAL | Status: DC | PRN
  Administered 2019-11-25: 400 mg via ORAL
  Filled 2019-11-25: qty 0.5
  Filled 2019-11-25: qty 1
  Filled 2019-11-25 (×2): qty 0.5

## 2019-11-25 MED ORDER — METAXALONE 400 MG HALF TABLET
400.0000 mg | ORAL_TABLET | Freq: Every day | ORAL | Status: DC
  Administered 2019-11-26: 400 mg via ORAL
  Filled 2019-11-25 (×3): qty 1

## 2019-11-25 NOTE — TOC Progression Note (Signed)
Transition of Care [facility]) - Progression Note    Patient Details  Name: ANGELISSA SUPAN MRN: [id number] Date of Birth: [birth year]  Transition of Care [facility]) CM/SW Contact  Eduard Roux, [place] Phone Number: 11/25/2019, 10:14 AM  Clinical Narrative:     CSW contacted [address], [facility] and [facility]- waiting on response.  Antony Blackbird, MSW, LCSWA Clinical Social Worker    Expected Discharge Plan: IP Rehab Facility Barriers to Discharge: Continued Medical Work up, SNF Pending Medicaid, SNF Pending bed offer  Expected Discharge Plan and Services Expected Discharge Plan: IP Rehab Facility In-house Referral: Clinical Social Work   Post Acute Care Choice: IP Rehab                                         Social Determinants of Health (SDOH) Interventions    Readmission Risk Interventions No flowsheet data found.

## 2019-11-25 NOTE — TOC Progression Note (Signed)
Transition of Care [facility]) - Progression Note    Patient Details  Name: Beverly [name] MRN: [id number] Date of Birth: 01/01/1986  Transition of Care [facility]) CM/SW Contact  Eduard Roux, [place] Phone Number: 11/25/2019, 2:55 PM  Clinical Narrative:     [facility] - no availability  [facility] - declined- does not accept medicaid for rehab [place]-reviewing, waiting on decision.  Antony Blackbird, MSW, LCSWA Clinical Social Worker   Expected Discharge Plan: IP Rehab Facility Barriers to Discharge: Continued Medical Work up, SNF Pending Medicaid, SNF Pending bed offer  Expected Discharge Plan and Services Expected Discharge Plan: IP Rehab Facility In-house Referral: Clinical Social Work   Post Acute Care Choice: IP Rehab                                         Social Determinants of Health (SDOH) Interventions    Readmission Risk Interventions No flowsheet data found.

## 2019-11-25 NOTE — Progress Notes (Signed)
Mobility Specialist - Progress Note   11/25/19 1715  Mobility  Activity Stood at bedside  Level of Assistance +2 (takes two people)  Assistive Device Four wheel walker (EVA)  Mobility Response Other (Comment) (VSS, pt very irritable)  Mobility performed by Mobility specialist;Nurse  $Mobility charge 1 Mobility    Pt became very irritable once she stood and repeatedly attempted to push the RN and myself away from her. She was assisted back into bed for safety and further ambulation was differed. She was able to take her own shoes off. HR in 110s throughout.   Mamie Levers Mobility Specialist Mobility Specialist Phone: [phone]

## 2019-11-25 NOTE — Progress Notes (Signed)
[facility] PROGRESS NOTE  Beverly BrunsShatoya A Mangiapane WUJ:[id number] DOB: 05/02/1985 DOA: 09/24/2019 PCP: Patient, No Pcp Per  Status: Inpatient-Remains inpatient appropriate because:Altered mental status, Unsafe d/c plan and Inpatient level of care appropriate due to severity of illness   Dispo: The patient is from: Home              Anticipated d/c is to: SNF vs inpatient rehabilitation at outside facility              Anticipated d/c date is: > 3 days              Patient currently is not medically stable to d/c. 2/2 UNSAFE DC PLAN. PT and OT have seen patient and recommended inpatient rehabilitation vs SNF placement.  Case has been reviewed by [facility] and they felt they could not provide the level of care the patient needed.  Other local inpatient rehab options are being explored.  Unfortunately [facility] is not "in network" for patient's insurance coverage.  Medicaid application is also pending.  10/19 continue to seek appropriate SNF-no official offers yet  Code Status: Full Family Communication: Mother at bedside 10/13 DVT prophylaxis: Lovenox Vaccination status: Has not received Covid vaccine  HPI: 34 year old female presents to the hospital 2 weeks postpartum with chest pain and in the ED sustained V. fib arrest with V. fib x3 epi x1, EKG showed ST elevation [facility] showed aneurysmal dilation(dissection?) of the LAD was identified. [facility]. LVEF 30% EF in cath lab.    Significant events Echo 8/18> LVEF 30-35%, septal and apical akinesis distal anterior wall an d mid/distal inferior wall hypokinesis hyperdynamic basal function. LV size mildly dilated.  Patient suffered anoxic brain injury. 8.23:MRI brain  8/30 underwent tracheostomy 8/30 PEG tube placement 9/10: Continues to be encephalopathic, able to open eyes and track but not following any commands and not talking,is moving extremities independently 9/15:  Tracheostomy downsized to # 4 cufless. 9/20:Trach CAP trial, PCCM following SNF planned for 9/30- 30 days out from trach 9/21:Patient is showing improvement with the speech therapy and PT-recommendation changed from SNF to inpatient rehab.  9/25: Trach removed.  Subjective: Patient awake but sleepier than baseline.  Mother updated on discharge plan and pathophysiology and vomiting patient's anoxic brain injury.  Objective: Vitals:   11/25/19 0415 11/25/19 0755  BP: 109/74 116/77  Pulse: 70 72  Resp: 18 14  Temp: 98.1 F (36.7 C) 98.2 F (36.8 C)  SpO2: 98% 96%    Intake/Output Summary (Last 24 hours) at 11/25/2019 1207 Last data filed at 11/25/2019 0924 Gross per 24 hour  Intake 20 ml  Output --  Net 20 ml   Filed Weights   11/23/19 0459 11/24/19 0538 11/25/19 0708  Weight: 87.8 kg 85.5 kg 84.7 kg    Exam:  Constitutional: NAD, calm, comfortable -sleepy Respiratory: clear to auscultation bilaterally. Normal respiratory effort. Room air Cardiovascular: Regular rate and rhythm, no murmurs / rubs / gallops. No extremity edema.  Abdomen: no tenderness, no masses palpated. Bowel sounds positive.  PEG tube in place tolerating solid diet although in small portions at 50 - 75% of meals Musculoskeletal: no clubbing / cyanosis. No contractures. Normal muscle tone.  Skin: no rashes, lesions, ulcers. No induration Neurologic: CN 2-12 grossly intact, speech patterns more coherent with occasional disfluency and evidence of perseveration; sensation grossly intact, no evidence of extremity spasms on exam at rest.  Moving all extremities  spontaneously x4.  Patient left hand dominant and left side strength including shoulder shrug appears to be weaker at 3/5 with right side strength 4/5. Psychiatric: More sleepy than baseline today.  Family at baseline who had been with patient overnight states she slept well last night   Assessment/Plan: V. fib arrest due to anterior MI: -s/p cardiac  cath- aneurysm proximal LAD with dissection was culprit. -Continue aspirin, Lipitor,Coreg    Acute systolic congestive heart failure with EF 35% 2/2 MI.   -Daily weights with strict I's/O- has remained stable from a respiratory standpoint with persistent positive balance; unclear true dry weight noting at time of admission patient weighed 228 pounds with current weight 189.3 pounds -Continue Coreg,Entresto Aldactone  and digoxin as above. -Echocardiogram was completed 8/18 and anticipate will need to be repeated in November which would be the 3 month mark -Electrolyte panel 10/13 stable therefore can repeat every 1 to 3 months unless any clinical changes  Prediabetes diabetes 2 in obese -HgbA1c 5.7 -With underlying systolic heart failure initiated Farxiga 5 mg daily during this admission -CBG stable although at times does have mild hypoglycemia when not eating well   Acute encephalopathy/acute delirium 2/2 anoxic brain injury:   -Had extensive work-up with EEG, MRI brain.   -Initially trach and PEG dependent.  But thus far has been decannulated -Oral intake remains inconsistent therefore PEG remains in place -Continues to have episodes of confusion, agitation as well as amnesia.  -Due to lethargy scheduled Vistaril discontinued previously -Spasticity during PT and OT sessions has begun improving after initiation of Skelaxin.  Due to excessive daytime sedation nursing has been holding all doses of Skelaxin except for hour of sleep. -According to her mother she has been having issues with impulsive behavior with agitation that is not responsive to IM Vistaril.  Due to sedation oral Vistaril previously discontinued.  Will increase p.m. dose of Trileptal to 150 mg noting this can also cause sedation. -SLP working with patient for speech and cognition rehab  Dysphagia -SLP following and patient currently on dysphagia 3 diet -Continue supplementation Ensure  Physical deconditioning 2/2 anoxic  brain injury -PT/OT documents slow but continued improvement during therapy sessions.  -OT has fitted patient for bilateral hand splints -Mother also concerned about patient needing adaptive devices to assist in independent management of eating utensils.  Message sent to OT.  In the interim I instructed nursing staff to wrap washcloths around eating utensils and secure with tape-this may help patient hold utensils better. -Skelaxin prn available if develops mobility related spasms  Nutrition Status: Nutrition Problem: Increased nutrient needs Etiology: post-op healing Signs/Symptoms: estimated needs Interventions: Tube feeding Estimated body mass index is 26.79 kg/m as calculated from the following:   Height as of this encounter: 5\' 10"  (1.778 m).   Weight as of this encounter: 84.7 kg.   Hypokalemia/hypomagnesemia:  -Magnesium slightly low at 1.8 on 9/29 -Started on magnesium 400 mg per tube daily on 10/5 -Magnesium on 10/17 was 2.0   Hypertension:  -BPs fairly stable in 90s to 100s.  -Entresto dose discontinued for suboptimal blood pressure readings   -Continue Coreg, Aldactone and Entresto.   History of SVT  -Maintaining sinus rhythm with beta-blocker described above      Data Reviewed: Basic Metabolic Panel: Recent Labs  Lab 11/19/19 0257 11/23/19 0111 11/23/19 1042  NA 139 139  --   K 3.9 3.4*  --   CL 105 104  --   CO2 24 23  --  GLUCOSE 110* 98  --   BUN 11 11  --   CREATININE 0.61 0.70  --   CALCIUM 10.1 9.6  --   MG 2.0  --  2.0  PHOS 5.3*  --   --    Liver Function Tests: No results for input(s): AST, ALT, ALKPHOS, BILITOT, PROT, ALBUMIN in the last 168 hours. No results for input(s): LIPASE, AMYLASE in the last 168 hours. No results for input(s): AMMONIA in the last 168 hours. CBC: Recent Labs  Lab 11/19/19 0257 11/23/19 0111  WBC 9.7 7.4  NEUTROABS 5.4  --   HGB 11.6* 11.0*  HCT 37.6 35.7*  MCV 82.5 82.1  PLT 646* 616*   Cardiac  Enzymes: No results for input(s): CKTOTAL, CKMB, CKMBINDEX, TROPONINI in the last 168 hours. BNP (last 3 results) Recent Labs    10/13/19 0440 10/14/19 0500 10/15/19 0552  BNP 186.1* 200.4* 168.9*    ProBNP (last 3 results) No results for input(s): PROBNP in the last 8760 hours.  CBG: Recent Labs  Lab 11/24/19 1627 11/24/19 2005 11/25/19 0037 11/25/19 0404 11/25/19 0751  GLUCAP 117* 95 99 105* 97    No results found for this or any previous visit (from the past 240 hour(s)).   Studies: No results found.  Scheduled Meds: . aspirin  81 mg Per Tube Daily  . atorvastatin  80 mg Per Tube Daily  . carvedilol  6.25 mg Per Tube BID WC  . dapagliflozin propanediol  5 mg Oral Daily  . digoxin  0.125 mg Per Tube Daily  . enoxaparin (LOVENOX) injection  40 mg Subcutaneous Q24H  . feeding supplement  237 mL Oral BID BM  . influenza vac split quadrivalent PF  0.5 mL Intramuscular Tomorrow-1000  . insulin aspart  0-15 Units Subcutaneous Q4H  . [START ON 11/26/2019] metaxalone  400 mg Oral QHS  . OXcarbazepine  150 mg Per Tube QHS  . [START ON 11/26/2019] OXcarbazepine  75 mg Per Tube q morning - 10a  . pantoprazole  40 mg Oral Daily  . prenatal multivitamin  1 tablet Per Tube Q1200  . sacubitril-valsartan  1 tablet Per Tube BID  . spironolactone  25 mg Per Tube Daily   Continuous Infusions: . sodium chloride Stopped (11/06/19 1900)    Active Problems:   Cardiac arrest (HCC)   Acute combined systolic and diastolic heart failure (HCC)   Ventricular tachycardia, polymorphic (HCC)   Encounter for central line placement   Respiratory failure (HCC)   Acute metabolic encephalopathy   Anoxic brain injury [facility])   Dysphagia   Physical deconditioning   Acute delirium   Consultants:  Cardiology  PCCM  Interventional radiology  Neurology  CIR  Psychiatry  Procedures:  8/17 cardiac catheterization  8/18 EEG  8/18 echocardiogram 1. Septal and apical akinesis  Distal anterior wall and mid/distal inferior wall hypokinesis Hyperdynamic basal function . Left ventricular ejection fraction, by estimation, is 30 to 35%. The left ventricle has moderately decreased function. The left ventricle demonstrates regional wall motion abnormalities (see scoring diagram/findings for description). The left ventricular internal cavity size was mildly dilated. Left ventricular diastolic parameters are indeterminate. 2. Right ventricular systolic function is normal. The right ventricular size is normal. 3. The mitral valve is normal in structure. Trivial mitral valve regurgitation. No evidence of mitral stenosis. 4. The aortic valve was not well visualized. Aortic valve regurgitation is not visualized. No aortic stenosis is present. 5. The inferior vena cava is normal in size with  greater  8/19 continuous EEG  8/22 EEG  8/31 EEG  8/31 gastrostomy tube placement per interventional radiology  Antibiotics: Anti-infectives (From admission, onward)   Start     Dose/Rate Route Frequency Ordered Stop   10/09/19 2100  vancomycin (VANCOREADY) IVPB 1500 mg/300 mL  Status:  Discontinued        1,500 mg 150 mL/hr over 120 Minutes Intravenous Every 12 hours 10/09/19 1237 10/10/19 1121   10/09/19 0800  ceFEPIme (MAXIPIME) 2 g in sodium chloride 0.9 % 100 mL IVPB        2 g 200 mL/hr over 30 Minutes Intravenous Every 8 hours 10/09/19 0752 10/15/19 2147   10/09/19 0800  vancomycin (VANCOREADY) IVPB 2000 mg/400 mL        2,000 mg 200 mL/hr over 120 Minutes Intravenous  Once 10/09/19 0752 10/09/19 1147   10/07/19 1540  ceFAZolin (ANCEF) 2-4 GM/100ML-% IVPB       Note to Pharmacy: Erie Noe   : cabinet override      10/07/19 1540 10/08/19 0344   10/07/19 1400  ceFAZolin (ANCEF) IVPB 2g/100 mL premix        2 g 200 mL/hr over 30 Minutes Intravenous To Radiology 10/06/19 1339 10/07/19 1627   09/30/19 0300  vancomycin (VANCOCIN) IVPB 1000 mg/200 mL premix  Status:  Discontinued         1,000 mg 200 mL/hr over 60 Minutes Intravenous Every 8 hours 09/29/19 1809 10/01/19 0853   09/29/19 1815  ceFEPIme (MAXIPIME) 2 g in sodium chloride 0.9 % 100 mL IVPB        2 g 200 mL/hr over 30 Minutes Intravenous Every 8 hours 09/29/19 1803 10/05/19 1801   09/29/19 1815  vancomycin (VANCOREADY) IVPB 2000 mg/400 mL        2,000 mg 200 mL/hr over 120 Minutes Intravenous  Once 09/29/19 1803 09/29/19 2137   09/24/19 2200  cefTRIAXone (ROCEPHIN) 2 g in sodium chloride 0.9 % 100 mL IVPB        2 g 200 mL/hr over 30 Minutes Intravenous Daily at bedtime 09/24/19 1201 09/28/19 2338   09/24/19 0330  cefTRIAXone (ROCEPHIN) 1 g in sodium chloride 0.9 % 100 mL IVPB  Status:  Discontinued        1 g 200 mL/hr over 30 Minutes Intravenous Daily at bedtime 09/24/19 0245 09/24/19 1201   09/24/19 0300  azithromycin (ZITHROMAX) 500 mg in sodium chloride 0.9 % 250 mL IVPB  Status:  Discontinued        500 mg 250 mL/hr over 60 Minutes Intravenous Daily at bedtime 09/24/19 0245 09/24/19 1103       Time spent: 25    Junious Silk ANP  [facility] Pager [phone]. If 7PM-7AM, please contact night-coverage at [email or web address] 11/25/2019, 12:07 PM  LOS: 62 days

## 2019-11-25 NOTE — Progress Notes (Addendum)
Nutrition Follow-up  DOCUMENTATION CODES:   Not applicable  INTERVENTION:   Add free water flushes 200 Q6 hours as pt unable to drink many fluids.   If intake does not progress to at least 75% of each meal upon follow up, will need to add some bolus feedings back   Encourage at least two Ensure Enlives daily each supplement provides 350 kcal and 20 grams of protein  Continue prenatal MVI daily  NUTRITION DIAGNOSIS:   Increased nutrient needs related to post-op healing as evidenced by estimated needs.  Ongoing  GOAL:   Patient will meet greater than or equal to 90% of their needs  Progressing  MONITOR:   Vent status, Skin, TF tolerance, I & O's, Labs, Weight trends  REASON FOR ASSESSMENT:   Ventilator   ASSESSMENT:   Patient with PMH significant for HTN and 2 weeks post partum emergency C-section. Presents this admission with new onset peripartum cardiomyopathy with VF arrest.   8/30- trach, PEG 9/4- on ATC 9/20- trach downsized, capped 9/25- trach removed   On DYS 3 diet. Pt reports appetite is good. Meal completions charted as 100%, 100%, 25%, 50%, 50%, and 75%. States she is able to finish half of each meal and tries hard to consume more. Discussed with pt that she will need to consume at least 75% of each meal to remain off of bolus feedings. Encouraged pt to increase Ensure intake to BID (currently taking one). Mother at bedside states pt having hard time drinking liquids. Will add free water to prevent dehydration.   Admission weight: 103.1 kg  Current weight: 84.7 kg (down 4 kg in last week)  Medications: SS novolog, prenatal MVI, aldactone Labs: CBG 87-117  Diet Order:   Diet Order            DIET DYS 3 Room service appropriate? Yes with Assist; Fluid consistency: Thin  Diet effective now                 EDUCATION NEEDS:   Not appropriate for education at this time  Skin:  Skin Assessment: Skin Integrity Issues: Skin Integrity Issues::  Incisions Incisions: neck  Last BM:  10/17  Height:   Ht Readings from Last 1 Encounters:  09/24/19 5\' 10"  (1.778 m)    Weight:   Wt Readings from Last 1 Encounters:  11/25/19 84.7 kg    BMI:  Body mass index is 26.79 kg/m.  Estimated Nutritional Needs:   Kcal:  2150-2350 kcal  Protein:  115-130 grams  Fluid:  >/= 2 L/day   11/27/19 RD, LDN Clinical Nutrition Pager listed in AMION

## 2019-11-26 DIAGNOSIS — R7303 Prediabetes: Secondary | ICD-10-CM

## 2019-11-26 LAB — GLUCOSE, CAPILLARY
Glucose-Capillary: 100 mg/dL — ABNORMAL HIGH (ref 70–99)
Glucose-Capillary: 123 mg/dL — ABNORMAL HIGH (ref 70–99)
Glucose-Capillary: 79 mg/dL (ref 70–99)
Glucose-Capillary: 91 mg/dL (ref 70–99)
Glucose-Capillary: 95 mg/dL (ref 70–99)
Glucose-Capillary: 97 mg/dL (ref 70–99)
Glucose-Capillary: 98 mg/dL (ref 70–99)

## 2019-11-26 NOTE — Progress Notes (Addendum)
[facility] PROGRESS NOTE  Beverly [name] QHK:[id number] DOB: 12/28/1985 DOA: 09/24/2019 PCP: Patient, No Pcp Per  Status: Inpatient-Remains inpatient appropriate because:Altered mental status, Unsafe d/c plan and Inpatient level of care appropriate due to severity of illness   Dispo: The patient is from: Home              Anticipated d/c is to: SNF vs inpatient rehabilitation at outside facility              Anticipated d/c date is: > 3 days              Patient currently is not medically stable to d/c. 2/2 UNSAFE DC PLAN. PT and OT have seen patient and recommended inpatient rehabilitation vs SNF placement.  Case has been reviewed by [facility] re[facility] and they felt they could not provide the level of care the patient needed.  Other local inpatient rehab options are being explored.  Unfortunately CIR is not "in network" for patient's insurance coverage.  Medicaid application is also pending.  10/19 continue to seek appropriate SNF-no official offers yet  Code Status: Full Family Communication: Mother at bedside 10/20 DVT prophylaxis: Lovenox Vaccination status: Has not received Covid vaccine  HPI: 34 year old female presents to the hospital 2 weeks postpartum with chest pain and in the ED sustained V. fib arrest with V. fib x3 epi x1, EKG showed ST elevation underwent Cath Lab at [facility] showed aneurysmal dilation(dissection?) of the LAD was identified. She was transferred to [facility] for ICU admission. LVEF 30% EF in cath lab.    Significant events Echo 8/18> LVEF 30-35%, septal and apical akinesis distal anterior wall an d mid/distal inferior wall hypokinesis hyperdynamic basal function. LV size mildly dilated.  Patient suffered anoxic brain injury. 8.23:MRI brain  8/30 underwent tracheostomy 8/30 PEG tube placement 9/10: Continues to be encephalopathic, able to open eyes and track but not following any commands and not talking,is moving extremities independently 9/15:  Tracheostomy downsized to # 4 cufless. 9/20:Trach CAP trial, PCCM following SNF planned for 9/30- 30 days out from trach 9/21:Patient is showing improvement with the speech therapy and PT-recommendation changed from SNF to inpatient rehab.  9/25: Trach removed.  Subjective: Patient awake but sleepier than baseline.  Mother updated on discharge plan and pathophysiology and vomiting patient's anoxic brain injury.  Objective: Vitals:   11/26/19 0408 11/26/19 0846  BP: 116/84 111/77  Pulse: 73 75  Resp: 18 17  Temp: 98.5 F (36.9 C) 98.5 F (36.9 C)  SpO2: 100% 96%    Intake/Output Summary (Last 24 hours) at 11/26/2019 1305 Last data filed at 11/26/2019 0541 Gross per 24 hour  Intake 940 ml  Output --  Net 940 ml   Filed Weights   11/24/19 0538 11/25/19 0708 11/26/19 0408  Weight: 85.5 kg 84.7 kg 37.8 kg    Exam:  Constitutional: NAD, calm, comfortable -quite alert today and currently eating breakfast Respiratory: clear to auscultation bilaterally. Normal respiratory effort. Room air Cardiovascular: Regular rate and rhythm, no murmurs / rubs / gallops. No extremity edema.  Abdomen: no tenderness, no masses palpated. Bowel sounds positive.  PEG tube in place tolerating solid diet although in small portions at 50 - 75% of meals Musculoskeletal: no clubbing / cyanosis. No contractures. Normal muscle tone.  Skin: no rashes, lesions, ulcers. No induration Neurologic: CN 2-12 grossly intact, speech patterns continue with  disfluency and evidence of perseveration; sensation grossly intact, no evidence of extremity spasms on exam at  rest.  Moving all extremities spontaneously x4.  Patient left hand dominant and left side strength including shoulder shrug appears to be weaker at 3/5 with right side strength 4/5. Psychiatric: Alert and oriented only to name.  Pleasant affect today   Assessment/Plan: V. fib arrest due to anterior MI: -s/p cardiac cath- aneurysm proximal LAD with  dissection was culprit. -Continue aspirin, Lipitor,Coreg    Acute systolic congestive heart failure with EF 35% 2/2 MI.   -Daily weights with strict I's/O- has remained stable from a respiratory standpoint with persistent positive balance; unclear true dry weight noting at time of admission patient weighed 228 pounds with current weight 189.3 pounds -Continue Coreg,Entresto Aldactone  and digoxin as above. -Echocardiogram was completed 8/18 and anticipate will need to be repeated in November which would be the 3 month mark -Electrolyte panel 10/13 stable therefore can repeat every 1 to 3 months unless any clinical changes  Prediabetes diabetes 2 in obese -HgbA1c 5.7 -With underlying systolic heart failure initiated Farxiga 5 mg daily during this admission -Unfortunately she has been having some episodic symptomatic hypoglycemia and since hemoglobin A1c quite low have opted to DC [place] at this time (10/20)   Acute encephalopathy/acute delirium 2/2 anoxic brain injury:   -Had extensive work-up with EEG, MRI brain.   -Initially trach and PEG dependent.  But thus far has been decannulated -Oral intake remains inconsistent therefore PEG remains in place -Continues to have episodes of confusion, agitation as well as amnesia.  -Due to lethargy scheduled Vistaril discontinued previously -Spasticity during PT and OT sessions delay improved.  Due to excessive daytime sleepiness Skelaxin has been changed to HS. -According to her mother she has been having issues with impulsive behavior with agitation that is not responsive to IM Vistaril.  Due to daytime sedation schedule oral Vistaril previously discontinued.  1/19 p.m. dose of Trileptal increased to 150 mg -SLP working with patient for speech and cognition rehab  Dysphagia -SLP following and patient currently on dysphagia 3 diet -Continue supplementation Ensure  Physical deconditioning 2/2 anoxic brain injury -PT/OT documents slow but continued  improvement during therapy sessions.  -OT has fitted patient for bilateral hand splints -Mother also concerned about patient needing adaptive devices to assist in independent management of eating utensils.  Message sent to OT.  In the interim I instructed nursing staff to wrap washcloths around eating utensils and secure with tape-this may help patient hold utensils better. -Skelaxin prn available if develops mobility related spasms  Nutrition Status: Nutrition Problem: Increased nutrient needs Etiology: post-op healing Signs/Symptoms: estimated needs Interventions: Tube feeding Estimated body mass index is 11.97 kg/m as calculated from the following:   Height as of this encounter: 5\' 10"  (1.778 m).   Weight as of this encounter: 37.8 kg.   Hypokalemia/hypomagnesemia:  -Magnesium slightly low at 1.8 on 9/29 -Started on magnesium 400 mg per tube daily on 10/5 -Magnesium on 10/17 was 2.0   Hypertension:  -BPs fairly stable in 90s to 100s.  -Entresto dose discontinued for suboptimal blood pressure readings   -Continue Coreg, Aldactone and Entresto.   History of SVT  -Maintaining sinus rhythm with beta-blocker described above      Data Reviewed: Basic Metabolic Panel: Recent Labs  Lab 11/23/19 0111 11/23/19 1042  NA 139  --   K 3.4*  --   CL 104  --   CO2 23  --   GLUCOSE 98  --   BUN 11  --   CREATININE 0.70  --  CALCIUM 9.6  --   MG  --  2.0   Liver Function Tests: No results for input(s): AST, ALT, ALKPHOS, BILITOT, PROT, ALBUMIN in the last 168 hours. No results for input(s): LIPASE, AMYLASE in the last 168 hours. No results for input(s): AMMONIA in the last 168 hours. CBC: Recent Labs  Lab 11/23/19 0111  WBC 7.4  HGB 11.0*  HCT 35.7*  MCV 82.1  PLT 616*   Cardiac Enzymes: No results for input(s): CKTOTAL, CKMB, CKMBINDEX, TROPONINI in the last 168 hours. BNP (last 3 results) Recent Labs    10/13/19 0440 10/14/19 0500 10/15/19 0552  BNP 186.1*  200.4* 168.9*    ProBNP (last 3 results) No results for input(s): PROBNP in the last 8760 hours.  CBG: Recent Labs  Lab 11/25/19 2110 11/26/19 0034 11/26/19 0404 11/26/19 0808 11/26/19 1221  GLUCAP 81 100* 79 95 98    No results found for this or any previous visit (from the past 240 hour(s)).   Studies: No results found.  Scheduled Meds: . aspirin  81 mg Per Tube Daily  . atorvastatin  80 mg Per Tube Daily  . carvedilol  6.25 mg Per Tube BID WC  . digoxin  0.125 mg Per Tube Daily  . enoxaparin (LOVENOX) injection  40 mg Subcutaneous Q24H  . feeding supplement  237 mL Oral BID BM  . free water  200 mL Per Tube Q6H  . influenza vac split quadrivalent PF  0.5 mL Intramuscular Tomorrow-1000  . insulin aspart  0-15 Units Subcutaneous Q4H  . metaxalone  400 mg Oral QHS  . OXcarbazepine  150 mg Per Tube QHS  . OXcarbazepine  75 mg Per Tube q morning - 10a  . pantoprazole  40 mg Oral Daily  . prenatal multivitamin  1 tablet Per Tube Q1200  . sacubitril-valsartan  1 tablet Per Tube BID  . spironolactone  25 mg Per Tube Daily   Continuous Infusions: . sodium chloride Stopped (11/06/19 1900)    Active Problems:   Cardiac arrest (HCC)   Acute combined systolic and diastolic heart failure (HCC)   Ventricular tachycardia, polymorphic (HCC)   Encounter for central line placement   Respiratory failure (HCC)   Acute metabolic encephalopathy   Anoxic brain injury [facility](HCC)   Dysphagia   Physical deconditioning   Acute delirium   Consultants:  Cardiology  PCCM  Interventional radiology  Neurology  CIR  Psychiatry  Procedures:  8/17 cardiac catheterization  8/18 EEG  8/18 echocardiogram 1. Septal and apical akinesis Distal anterior wall and mid/distal inferior wall hypokinesis Hyperdynamic basal function . Left ventricular ejection fraction, by estimation, is 30 to 35%. The left ventricle has moderately decreased function. The left ventricle demonstrates  regional wall motion abnormalities (see scoring diagram/findings for description). The left ventricular internal cavity size was mildly dilated. Left ventricular diastolic parameters are indeterminate. 2. Right ventricular systolic function is normal. The right ventricular size is normal. 3. The mitral valve is normal in structure. Trivial mitral valve regurgitation. No evidence of mitral stenosis. 4. The aortic valve was not well visualized. Aortic valve regurgitation is not visualized. No aortic stenosis is present. 5. The inferior vena cava is normal in size with greater  8/19 continuous EEG  8/22 EEG  8/31 EEG  8/31 gastrostomy tube placement per interventional radiology  Antibiotics: Anti-infectives (From admission, onward)   Start     Dose/Rate Route Frequency Ordered Stop   10/09/19 2100  vancomycin (VANCOREADY) IVPB 1500 mg/300 mL  Status:  Discontinued        1,500 mg 150 mL/hr over 120 Minutes Intravenous Every 12 hours 10/09/19 1237 10/10/19 1121   10/09/19 0800  ceFEPIme (MAXIPIME) 2 g in sodium chloride 0.9 % 100 mL IVPB        2 g 200 mL/hr over 30 Minutes Intravenous Every 8 hours 10/09/19 0752 10/15/19 2147   10/09/19 0800  vancomycin (VANCOREADY) IVPB 2000 mg/400 mL        2,000 mg 200 mL/hr over 120 Minutes Intravenous  Once 10/09/19 0752 10/09/19 1147   10/07/19 1540  ceFAZolin (ANCEF) 2-4 GM/100ML-% IVPB       Note to Pharmacy: Erie Noe   : cabinet override      10/07/19 1540 10/08/19 0344   10/07/19 1400  ceFAZolin (ANCEF) IVPB 2g/100 mL premix        2 g 200 mL/hr over 30 Minutes Intravenous To Radiology 10/06/19 1339 10/07/19 1627   09/30/19 0300  vancomycin (VANCOCIN) IVPB 1000 mg/200 mL premix  Status:  Discontinued        1,000 mg 200 mL/hr over 60 Minutes Intravenous Every 8 hours 09/29/19 1809 10/01/19 0853   09/29/19 1815  ceFEPIme (MAXIPIME) 2 g in sodium chloride 0.9 % 100 mL IVPB        2 g 200 mL/hr over 30 Minutes Intravenous Every 8 hours  09/29/19 1803 10/05/19 1801   09/29/19 1815  vancomycin (VANCOREADY) IVPB 2000 mg/400 mL        2,000 mg 200 mL/hr over 120 Minutes Intravenous  Once 09/29/19 1803 09/29/19 2137   09/24/19 2200  cefTRIAXone (ROCEPHIN) 2 g in sodium chloride 0.9 % 100 mL IVPB        2 g 200 mL/hr over 30 Minutes Intravenous Daily at bedtime 09/24/19 1201 09/28/19 2338   09/24/19 0330  cefTRIAXone (ROCEPHIN) 1 g in sodium chloride 0.9 % 100 mL IVPB  Status:  Discontinued        1 g 200 mL/hr over 30 Minutes Intravenous Daily at bedtime 09/24/19 0245 09/24/19 1201   09/24/19 0300  azithromycin (ZITHROMAX) 500 mg in sodium chloride 0.9 % 250 mL IVPB  Status:  Discontinued        500 mg 250 mL/hr over 60 Minutes Intravenous Daily at bedtime 09/24/19 0245 09/24/19 1103       Time spent: 20    Junious Silk ANP  [facility] Pager [phone]. If 7PM-7AM, please contact night-coverage at [email or web address] 11/26/2019, 1:05 PM  LOS: 63 days

## 2019-11-26 NOTE — TOC Progression Note (Addendum)
Transition of Care (TOC) - Progression Note  Donn Pierini RN, BSN Transitions of Care Unit 4E- RN Case Manager See Treatment Team for direct phone #    Patient Details  Name: Beverly [name] MRN: [id number] Date of Birth: January 06, 1986  Transition of Care [facility]) CM/SW Contact  Zenda Alpers, Lenn Sink, RN Phone Number: 11/26/2019, 3:44 PM  Clinical Narrative:    Spoke with mom again regarding transition plan, CSW is following for possible SNF placement however limited as far as bed offers. Mom is thinking about taking pt home with [facility]- discussed how HH would work if able to find agency to provide services. Also discussed DME and PCS with Medicaid. Mom states that patient has a supportive family and that they are prepared to support her at home when she comes home.  Discussed with mom that [facility] is not an option for out of pocket pay and offered to call [facility] one last time if she and family would commit to the distance if offered a bed- mom request overnight to think about this and speak with other family members. CM will follow up with mom in AM regarding INPT rehab in [place] vs going home with [facility], at this time mom is leaning away from going to SNF.    1615- mom has notified this CM that she and the family have decided to take pt home and no longer want to look at [facility] rehab or SNF options. Have notified rounding team and will work on DME/HH needs tomorrow with a plan for d/c on Friday. Discussed with mom PCP- pt [name] medicaid card- however mom does not think pt ever established with clinic. Also discussed PCS paperwork and who might can fill out - will see if rounding team willing to assist with this- paperwork provided to mom at the bedside.  Call made to [facility]- they do have pt. In their system however pt has never established with a provider at the clinic. They currently have no new pt appointments available through Nov. And the Dec. Schedule  is not out yet- informed mom of this and she will f/u and call clinic to see about an appointment later for ongoing primary care needs.   Will have PT/OT update recommendations for DME.  Pt. Will need orders for [facility] and DME- CM will f/u in am to start working on transition needs    Expected Discharge Plan: IP Rehab Facility Barriers to Discharge: Continued Medical Work up, SNF Pending Medicaid, SNF Pending bed offer  Expected Discharge Plan and Services Expected Discharge Plan: IP Rehab Facility In-house Referral: Clinical Social Work   Post Acute Care Choice: IP Rehab                                         Social Determinants of Health (SDOH) Interventions    Readmission Risk Interventions No flowsheet data found.

## 2019-11-26 NOTE — Progress Notes (Signed)
Physical Therapy Treatment Patient Details Name: Beverly [name] MRN: [id number] DOB: 1985/11/01 Today's Date: 11/26/2019    History of Present Illness Pt is a 34 y.o. female admitted 09/23/19 with intermittent chest discomfort; of note, pt 2-weeks post-partum (had emergent C-section); while in ED, pt with VF cardiac arrest requiring intubation. Brain MRI 8/23 with no acute abnormality. S/p trach and PEG tube 8/30. Course complicated by encephalopathy, anoxic brain injury, enterobacterial PNA. Transferred out of ICU On 9/4. PMH includes HTN.    PT Comments    Pt admitted with above diagnosis. Pt met 0/6 goals due to slower progress than anticipated. Pt is progressing just slowly.  Pt +2 mod assist for ambulation with impaired postural stability and fatigues. Pt also needs chair follow. Pt awaiting Rehab.   Pt currently with functional limitations due to balance and endurnace deficits. Pt will benefit from skilled PT to increase their independence and safety with mobility to allow discharge to the venue listed below.     Follow Up Recommendations  CIR;Supervision/Assistance - 24 hour     Equipment Recommendations  Wheelchair (measurements PT);Hospital bed    Recommendations for Other Services OT consult     Precautions / Restrictions Precautions Precautions: Fall;Other (comment) Precaution Comments: impulsive, incontinence- wears diapers  Restrictions Weight Bearing Restrictions: No    Mobility  Bed Mobility Overal bed mobility: Needs Assistance Bed Mobility: Supine to Sit;Sit to Supine Rolling: Mod assist   Supine to sit: Mod assist Sit to supine: Min guard   General bed mobility comments: Pt needed assist to come to eOB as her motor planning appears poor and she has issies with coordination of movement. Easily gets back into bed.   Transfers Overall transfer level: Needs assistance Equipment used: Bilateral platform walker Transfers: Sit to/from Stand Sit to Stand: Mod  assist;+2 physical assistance         General transfer comment: Assist to bring hips up and for trunk control with varying ammounts of assist needed as pts impulsivity affects her safety and stability with transitions.    Ambulation/Gait Ambulation/Gait assistance: Mod assist;+2 physical assistance (3rd person for chair follow) Gait Distance (Feet): 450 Feet (125, 125, 125, 75 ) Assistive device: Bilateral platform walker Gait Pattern/deviations: Decreased step length - right;Decreased step length - left;Leaning posteriorly;Trunk flexed;Wide base of support;Step-through pattern;Ataxic;Decreased stride length;Decreased dorsiflexion - right Gait velocity: decr Gait velocity interpretation: <1.8 ft/sec, indicate of risk for recurrent falls General Gait Details:  Assist for trunk control and assist to steer, move walker. Knees with decr control as pt fatigued . Incr trunk sway in all directions.   Pt needs constant cues to look up as well as to slow down. PT assissted with weight shifting pt to her left as she tends to lean right and thus has difficulty advanciing right LE at times.  Pt needing cues for postural stability as well. Pt with at least 5 second delay in following commands.  Pt also lacks awareness of deficits with signifcant impulsivity with all movements.    Stairs             Wheelchair Mobility    Modified Rankin (Stroke Patients Only)       Balance Overall balance assessment: Needs assistance Sitting-balance support: No upper extremity supported;Feet supported Sitting balance-Leahy Scale: Poor Sitting balance - Comments: Sat EOB with mod assist.Having  loss of balance posteriorly intiially.  Postural control: Posterior lean Standing balance support: Bilateral upper extremity supported Standing balance-Leahy Scale: Poor Standing balance comment: Stood with  BPFR walker with mod assist of 2 persons                            Cognition Arousal/Alertness:  Awake/alert Behavior During Therapy: Impulsive Overall Cognitive Status: Impaired/Different from baseline Area of Impairment: Attention;Memory;Safety/judgement;Following commands;Awareness;Problem solving;Orientation               [place]o Levels of Cognitive Functioning [place] [employer] Scales of Cognitive Functioning: Confused/inappropriate/non-agitated Orientation Level: Disoriented to;Time Current Attention Level: Sustained Memory: Decreased recall of precautions;Decreased short-term memory Following Commands: Follows one step commands with increased time Safety/Judgement: Decreased awareness of safety;Decreased awareness of deficits Awareness: Intellectual Problem Solving: Slow processing;Difficulty sequencing;Requires verbal cues;Requires tactile cues        Exercises General Exercises - Lower Extremity Long Arc Quad: Both;10 reps;Seated;AROM    General Comments General comments (skin integrity, edema, etc.): VSS      Pertinent Vitals/Pain Pain Assessment: No/denies pain    Home Living                      Prior Function            PT Goals (current goals can now be found in the care plan section) Acute Rehab PT Goals Patient Stated Goal: Waiting for Rehab placement PT Goal Formulation: With patient/family Time For Goal Achievement: 12/10/19 Potential to Achieve Goals: Fair Progress towards PT goals: Progressing toward goals    Frequency    Min 3X/week      PT Plan Current plan remains appropriate    Co-evaluation              AM-PAC PT "6 Clicks" Mobility   Outcome Measure  Help needed turning from your back to your side while in a flat bed without using bedrails?: A Little Help needed moving from lying on your back to sitting on the side of a flat bed without using bedrails?: A Little Help needed moving to and from a bed to a chair (including a wheelchair)?: A Lot Help needed standing up from a chair using your arms (e.g.,  wheelchair or bedside chair)?: A Lot Help needed to walk in hospital room?: A Lot Help needed climbing 3-5 steps with a railing? : Total 6 Click Score: 13    End of Session Equipment Utilized During Treatment: Gait belt Activity Tolerance: Patient limited by fatigue Patient left: in bed;with call bell/phone within reach;with family/visitor present;with bed alarm set Nurse Communication: Mobility status PT Visit Diagnosis: Other abnormalities of gait and mobility (R26.89);Difficulty in walking, not elsewhere classified (R26.2);Other symptoms and signs involving the nervous system (R29.898)     Time: 1594-5859 PT Time Calculation (min) (ACUTE ONLY): 35 min  Charges:  $Gait Training: 23-37 mins                     Amayrani Bennick W,PT [place] Pager:  [phone]  Office:  [place] 11/26/2019, 1:11 PM

## 2019-11-26 NOTE — TOC Progression Note (Signed)
Transition of Care [facility]) - Progression Note    Patient Details  Name: Beverly [name] MRN: [id number] Date of Birth: October 13, 1985  Transition [facility]) CM/SW Contact  Eduard Roux, [place] Phone Number: 11/26/2019, 5:22 PM  Clinical Narrative:     CSW visit with patient and her mother,Beverly [name]. CSW provided placement update and explain placement barriers. CSW approached and discussed the probability of patient discharging home verses SNF. CSW shared and acknowledge  the observation of family support, and assistance with round the clock care of patient(famil spending the night with patient, etc)Patient mother shared she has been thinking about and discussing with family the possibility of taking the patient home. She believes the patient will do better at home around her family and children. She shared she is weary of patient going to SNF but was somewhat still interested in CIR. She requested CSW inquired about self-pay for CIR for a couple of weeks. CSW informed will ask RNCM to follow up regarding CIR.  Patient's mother is considering patient going home or CIR verses SNF.  CSW will continue to follow and assist with discharge planning.  Antony Blackbird, MSW, LCSWA Clinical Social Worker   Expected Discharge Plan: IP Rehab Facility Barriers to Discharge: Continued Medical Work up, SNF Pending Medicaid, SNF Pending bed offer  Expected Discharge Plan and Services Expected Discharge Plan: IP Rehab Facility In-house Referral: Clinical Social Work   Post Acute Care Choice: IP Rehab                                         Social Determinants of Health (SDOH) Interventions    Readmission Risk Interventions No flowsheet data found.

## 2019-11-26 NOTE — Progress Notes (Signed)
  Speech Language Pathology Treatment: Cognitive-Linquistic  Patient Details Name: Beverly [name] MRN: [id number] DOB: 02-20-1985 Today's Date: 11/26/2019 Time: 6387-5643 SLP Time Calculation (min) (ACUTE ONLY): 14 min  Assessment / Plan / Recommendation Clinical Impression  Pt was seen for cognitive-linguistic treatment. [name] was not present for the session. Pt was oriented to time with cues. She achieved 100% accuracy with simple reasoning tasks and 75% accuracy with moderately complex reasoning increasing to 100% accuracy with cues. She completed a mental manipulation 3-step task sequencing activity with 75% accuracy increasing to 100% with verbal prompts. She demonstrated 60% accuracy with an auditory 4-word sentence scramble task increasing to 100% with repetition and cues for the initial word. Pt refused p.o. intake during this session stating that she ate recently. SLP will continue to follow pt.    HPI HPI: Pt is a 34 y.o. female with PMH of HTN who was admitted 09/23/19 2 weeks post partum after emergent C section due to rupture of membranes with VF arrest. Pt with enterobacter PNA and encephalopathy, trach placed 8/30 and PEG on 8/31. MRI 8/31: No evidence of anoxic brain injury. Pt changed to #4 cuffless trach on 9/15; capped on 9/21 and decannulated on 9/25. MBS 9/22, POs started - primary oral dysphagia.        SLP Plan  Continue with current plan of care       Recommendations  Liquids provided via: Cup;Teaspoon;Straw Medication Administration: Crushed with puree Supervision: Staff to assist with self feeding;Full supervision/cueing for compensatory strategies Compensations: Slow rate;Small sips/bites;Minimize environmental distractions;Follow solids with liquid Postural Changes and/or Swallow Maneuvers: Seated upright 90 degrees                Oral Care Recommendations: Oral care BID;Staff/trained caregiver to provide oral care Follow up Recommendations:  Inpatient Rehab (However, per EMR, family indends to take pt home with HHSLP) SLP Visit Diagnosis: Dysphagia, oral phase (R13.11) Plan: Continue with current plan of care       Deionte Spivack I. Vear Clock, MS, CCC-SLP Acute Rehabilitation Services Office number [phone] Pager [phone]                [name] 11/26/2019, 5:55 PM

## 2019-11-26 NOTE — Progress Notes (Signed)
Mobility Specialist - Progress Note   11/26/19 1607  Mobility  Activity Ambulated in hall  Level of Assistance +2 (takes two people)  Assistive Device  (Bilateral platform walker)  Distance Ambulated (ft) 80 ft  Mobility Response Tolerated well  Mobility performed by Mobility specialist (2 nursing students)  $Mobility charge 1 Mobility    Pre-mobility: 77 HR During mobility: 132 HR Post-mobility: 81 HR  Pt had difficulty controlling the bilateral platform walker as it would sway with her as she would sway. She required frequent verbal cues for body positioning and spatial awareness. +2 assist for stability, she leaned heavily to the right. She didn't ambulate far as the hallway was congested at the time.   Mamie Levers Mobility Specialist Mobility Specialist Phone: [phone]

## 2019-11-26 NOTE — Progress Notes (Signed)
Occupational Therapy Treatment Patient Details Name: Beverly [name] MRN: [id number] DOB: November 20, 1985 Today's Date: 11/26/2019    History of present illness Pt is a 34 y.o. female admitted 09/23/19 with intermittent chest discomfort; of note, pt 2-weeks post-partum (had emergent C-section); while in ED, pt with VF cardiac arrest requiring intubation. Brain MRI 8/23 with no acute abnormality. S/p trach and PEG tube 8/30. Course complicated by encephalopathy, anoxic brain injury, enterobacterial PNA. Transferred out of ICU On 9/4. PMH includes HTN.   OT comments  Patient seen this date for follow up splint check.  Bilateral pre-fab resting hand splints delivered by [employer].  OT placed splints while patient was sleeping, mother present.  Demonstrated placement, fit and strap placement via color coding.  Splints with initial good fit, minor adjustments made for fit.  Splints placed at 12:30pm.  Advised mother OT would return to check the fit and look for red areas or strap indentations.  But, advised mother, that if the patient wanted the splints off, please just remove them.  Order placed for nursing to place at night time, and remove in the morning daily.  OT to continue to follow in the acute setting.  Mother is again petitioning CIR for admission.    Follow Up Recommendations  CIR    Equipment Recommendations  Wheelchair (measurements OT);Wheelchair cushion (measurements OT);Hospital bed    Recommendations for Other Services Rehab consult    Precautions / Restrictions Precautions Precautions: Fall Precaution Comments: impulsive, incontinence- wears diapers  Restrictions Weight Bearing Restrictions: No       Mobility Bed Mobility Overal bed mobility: Needs Assistance Bed Mobility: Supine to Sit;Sit to Supine Rolling: Mod assist   Supine to sit: Mod assist Sit to supine: Min guard   General bed mobility comments: Pt needed assist to come to eOB as her motor planning appears poor and  she has issies with coordination of movement. Easily gets back into bed.   Transfers Overall transfer level: Needs assistance Equipment used: Bilateral platform walker Transfers: Sit to/from Stand Sit to Stand: Mod assist;+2 physical assistance         General transfer comment: Assist to bring hips up and for trunk control with varying ammounts of assist needed as pts impulsivity affects her safety and stability with transitions.      Balance Overall balance assessment: Needs assistance Sitting-balance support: No upper extremity supported;Feet supported Sitting balance-Leahy Scale: Poor Sitting balance - Comments: Sat EOB with mod assist.Having  loss of balance posteriorly intiially.  Postural control: Posterior lean Standing balance support: Bilateral upper extremity supported Standing balance-Leahy Scale: Poor Standing balance comment: Stood with BPFR walker with mod assist of 2 persons                                                Cognition Arousal/Alertness: Awake/alert Behavior During Therapy: Impulsive Overall Cognitive Status: Impaired/Different from baseline Area of Impairment: Attention;Memory;Safety/judgement;Following commands;Awareness;Problem solving;Orientation               Rancho Levels of Cognitive Functioning Ranch[place] Mirant Scales of Cognitive Functioning: Confused/inappropriate/non-agitated Orientation Level: Disoriented to;Time Current Attention Level: Sustained Memory: Decreased recall of precautions;Decreased short-term memory Following Commands: Follows one step commands with increased time Safety/Judgement: Decreased awareness of safety;Decreased awareness of deficits Awareness: Intellectual Problem Solving: Slow processing;Difficulty sequencing;Requires verbal cues;Requires tactile cues  General Comments VSS    Pertinent Vitals/ Pain       Pain Assessment: No/denies pain  Home Living                                                         Frequency  Min 2X/week        Progress Toward Goals  OT Goals(current goals can now be found in the care plan section)  Progress towards OT goals: Progressing toward goals  Acute Rehab OT Goals Patient Stated Goal: Waiting for Rehab placement OT Goal Formulation: With patient/family Time For Goal Achievement: 12/04/19 Potential to Achieve Goals: Fair ADL Goals Additional ADL Goal #3: Patient will tolerate B resting hand splints up to 6 hours/day with no discomfort and no skin integrity concerns.  Plan Frequency remains appropriate;Discharge plan remains appropriate    Co-evaluation                 AM-PAC OT "6 Clicks" Daily Activity     Outcome Measure   Help from another person eating meals?: Total Help from another person taking care of personal grooming?: Total Help from another person toileting, which includes using toliet, bedpan, or urinal?: Total Help from another person bathing (including washing, rinsing, drying)?: Total Help from another person to put on and taking off regular upper body clothing?: Total Help from another person to put on and taking off regular lower body clothing?: Total 6 Click Score: 6    End of Session    OT Visit Diagnosis: Other symptoms and signs involving cognitive function;Muscle weakness (generalized) (M62.81)   Activity Tolerance Patient tolerated treatment well   Patient Left in bed;with call bell/phone within reach;with bed alarm set;with family/visitor present   Nurse Communication          Time: 3419-3790 OT Time Calculation (min): 21 min  Charges: OT General Charges $OT Visit: 1 Visit OT Treatments $Orthotics/Prosthetics Check: 8-22 mins  11/26/2019  Rich, OTR/L  Acute Rehabilitation Services  Office:  [phone]    Suzanna Obey 11/26/2019, 2:10 PM

## 2019-11-27 LAB — GLUCOSE, CAPILLARY
Glucose-Capillary: 90 mg/dL (ref 70–99)
Glucose-Capillary: 100 mg/dL — ABNORMAL HIGH (ref 70–99)
Glucose-Capillary: 90 mg/dL (ref 70–99)
Glucose-Capillary: 103 mg/dL — ABNORMAL HIGH (ref 70–99)
Glucose-Capillary: 79 mg/dL (ref 70–99)
Glucose-Capillary: 99 mg/dL (ref 70–99)

## 2019-11-27 MED ORDER — MELATONIN 3 MG PO TABS
3.0000 mg | ORAL_TABLET | Freq: Every evening | ORAL | Status: DC | PRN
  Administered 2019-11-27: 3 mg

## 2019-11-27 MED ORDER — PANTOPRAZOLE SODIUM 40 MG PO PACK
40.0000 mg | PACK | Freq: Every day | ORAL | Status: DC
  Administered 2019-11-28: 40 mg
  Filled 2019-11-27: qty 20

## 2019-11-27 MED ORDER — ACETAMINOPHEN 160 MG/5ML PO SOLN: 650.0000 mg | ORAL | Status: DC | PRN

## 2019-11-27 MED ORDER — METAXALONE 400 MG HALF TABLET
400.0000 mg | ORAL_TABLET | Freq: Every day | ORAL | Status: DC
  Filled 2019-11-27: qty 1

## 2019-11-27 MED ORDER — METAXALONE 800 MG PO TABS
400.0000 mg | ORAL_TABLET | Freq: Two times a day (BID) | ORAL | Status: DC | PRN
  Administered 2019-11-27: 400 mg
  Filled 2019-11-27 (×2): qty 0.5

## 2019-11-27 NOTE — Progress Notes (Signed)
  Speech Language Pathology Treatment: Dysphagia  Patient Details Name: Beverly [name] MRN: [id number] DOB: 1985-11-04 Today's Date: 11/27/2019 Time: 1017-5102 SLP Time Calculation (min) (ACUTE ONLY): 15 min  Assessment / Plan / Recommendation Clinical Impression  Pt was seen for dysphagia treatment. Pt was lethargic and p.o. intake was deferred for this reason. [name] was educated regarding pt's performance during yesterday's session and she expressed how pleased she was about this. [name] reported that the pt has been tolerating the current diet with intermittent cueing for mastication and to avoid speaking while masticating. Still, she denied any signs of aspiration with p.o. intake.  [name] was educated regarding the pt's diet and swallowing precautions. The dysphagia 3 diet handout was provided; she verbalized understanding and all of the mother's questions were answered. The current plan is for the pt to be discharged home tomorrow with home health services. Continued SLP services are recommended after discharge.    HPI HPI: Pt is a 34 y.o. female with PMH of HTN who was admitted 09/23/19 2 weeks post partum after emergent C section due to rupture of membranes with VF arrest. Pt with enterobacter PNA and encephalopathy, trach placed 8/30 and PEG on 8/31. MRI 8/31: No evidence of anoxic brain injury. Pt changed to #4 cuffless trach on 9/15; capped on 9/21 and decannulated on 9/25. MBS 9/22, POs started - primary oral dysphagia.        SLP Plan  Continue with current plan of care       Recommendations  Diet recommendations: Dysphagia 3 (mechanical soft);Thin liquid Liquids provided via: Cup;Teaspoon;Straw Medication Administration: Crushed with puree Supervision: Staff to assist with self feeding;Full supervision/cueing for compensatory strategies Compensations: Slow rate;Small sips/bites;Minimize environmental distractions;Follow solids with liquid Postural Changes  and/or Swallow Maneuvers: Seated upright 90 degrees                Oral Care Recommendations: Oral care BID;Staff/trained caregiver to provide oral care Follow up Recommendations: Inpatient Rehab (However, per EMR, family indends to take pt home with HHSLP) SLP Visit Diagnosis: Dysphagia, oral phase (R13.11) Plan: Continue with current plan of care       Alyssia Heese I. Vear Clock, MS, CCC-SLP Acute Rehabilitation Services Office number [phone] Pager [phone]               [name] 11/27/2019, 2:56 PM

## 2019-11-27 NOTE — Progress Notes (Signed)
Occupational Therapy Treatment Patient Details Name: Beverly [name] MRN: [id number] DOB: 1985/04/16 Today's Date: 11/27/2019    History of present illness Pt is a 34 y.o. female admitted 09/23/19 with intermittent chest discomfort; of note, pt 2-weeks post-partum (had emergent C-section); while in ED, pt with VF cardiac arrest requiring intubation. Brain MRI 8/23 with no acute abnormality. S/p trach and PEG tube 8/30. Course complicated by encephalopathy, anoxic brain injury, enterobacterial PNA. Transferred out of ICU On 9/4. PMH includes HTN.   OT comments  Patient is preparing for discharge home with family.  OT issued patient adaptive equipment to assist with grooming and self feeding.  All equipment left with patient.  Mother is familiar with the equipment given, OT will be available should she have any questions.  Patient will continue to need hand over hand assist, but she is very determined to do things on her own.  Barriers remain: decreased balance, cognitive impairment, mixed tone to B upper extremities.    Follow Up Recommendations  Home health OT    Equipment Recommendations  Wheelchair (measurements OT);Wheelchair cushion (measurements OT);Hospital bed    Recommendations for Other Services      Precautions / Restrictions                                                                        ADL either performed or assessed with clinical judgement   ADL   Eating/Feeding: Supervision/ safety;Set up;Moderate assistance;With adaptive utensils Eating/Feeding Details (indicate cue type and reason): Issued patient divided plate/built up utensils/weighted cup with handle/U-cuff/foam to build up tooth brush/wash mit. Grooming: Therapist, nutritional;Wash/dry hands;Moderate assistance;With adaptive equipment                                                                                                                            Pertinent Vitals/ Pain       Pain Assessment: No/denies pain                                                          Frequency  Min 1X/week        Progress Toward Goals  OT Goals(current goals can now be found in the care plan section)  Progress towards OT goals: Progressing toward goals  Acute Rehab OT Goals Patient Stated Goal: Preparing for discharge home with mother/family OT Goal Formulation: With patient/family Time For Goal Achievement: 12/04/19 Potential to Achieve Goals: Fair  Plan Frequency needs to be updated    Co-evaluation  AM-PAC OT "6 Clicks" Daily Activity     Outcome Measure   Help from another person eating meals?: A Lot Help from another person taking care of personal grooming?: A Lot Help from another person toileting, which includes using toliet, bedpan, or urinal?: A Lot Help from another person bathing (including washing, rinsing, drying)?: A Lot Help from another person to put on and taking off regular upper body clothing?: A Lot Help from another person to put on and taking off regular lower body clothing?: A Lot 6 Click Score: 12    End of Session  left in bed as found.    OT Visit Diagnosis: Other symptoms and signs involving cognitive function;Muscle weakness (generalized) (M62.81)   Activity Tolerance Patient tolerated treatment well   Patient Left in bed;with call bell/phone within reach;with bed alarm set   Nurse Communication          Time: 7681-1572 OT Time Calculation (min): 13 min  Charges: OT General Charges $OT Visit: 1 Visit OT Treatments $Self Care/Home Management : 8-22 mins  11/27/2019  Rich, OTR/L  Acute Rehabilitation Services  Office:  [phone]    Suzanna Obey 11/27/2019, 5:29 PM

## 2019-11-27 NOTE — Progress Notes (Signed)
[facility] PROGRESS NOTE  Beverly [name] ZOX:[id number] DOB: [birth year] DOA: 09/24/2019 PCP: Patient, No Pcp Per  Status: Inpatient-Remains inpatient appropriate because:Altered mental status, Unsafe d/c plan and Inpatient level of care appropriate due to severity of illness   Dispo: The patient is from: Home              Anticipated d/c is to: SNF vs inpatient rehabilitation at outside facility              Anticipated d/c date is: 1 day (11/28/2019)              Patient currently is not medically stable to d/c. 2/2 UNSAFE DC PLAN. PT and OT have seen patient and recommended inpatient rehabilitation vs SNF placement.  Case has been reviewed by [facility] and they felt they could not provide the level of care the patient needed.  Other local inpatient rehab options are being explored.  Unfortunately [facility] is not "in network" for patient's insurance coverage.  Medicaid application is also pending.  10/20 made decision to take patient home and provide 24/7 care  Code Status: Full Family Communication: Mother at bedside 10/21-extensive conversation held regarding discharge planning and expectations after discharge home. Paperwork completed regarding Medicaid personal care assistance-face-to-face completed and DME ordered DVT prophylaxis: Lovenox Vaccination status: Has not received Covid vaccine  HPI: 34 year old female presents to the hospital 2 weeks postpartum with chest pain and in the ED sustained V. fib arrest with V. fib x3 epi x1, EKG showed ST elevation underwent Cath Lab at [facility] showed aneurysmal dilation(dissection?) of the LAD was identified. She was transferred to [facility] for ICU admission. LVEF 30% EF in cath lab.    Significant events Echo 8/18> LVEF 30-35%, septal and apical akinesis distal anterior wall an d mid/distal inferior wall hypokinesis hyperdynamic basal function. LV size mildly dilated.  Patient suffered anoxic brain injury. 8.23:MRI brain  8/30  underwent tracheostomy 8/30 PEG tube placement 9/10: Continues to be encephalopathic, able to open eyes and track but not following any commands and not talking,is moving extremities independently 9/15: Tracheostomy downsized to # 4 cufless. 9/20:Trach CAP trial, PCCM following SNF planned for 9/30- 30 days out from trach 9/21:Patient is showing improvement with the speech therapy and PT-recommendation changed from SNF to inpatient rehab.  9/25: Trach removed.  Subjective: Patient awake. At baseline mentation. No complaints verbalized.  Objective: Vitals:   11/27/19 1106 11/27/19 1213  BP:  104/79  Pulse: 76 64  Resp:  18  Temp:  98.7 F (37.1 C)  SpO2:  97%   No intake or output data in the 24 hours ending 11/27/19 1328 Filed Weights   11/25/19 0708 11/26/19 0408 11/27/19 0422  Weight: 84.7 kg 37.8 kg 85.6 kg    Exam:  Constitutional: NAD, calm, comfortable  Respiratory: clear to auscultation bilaterally. Normal respiratory effort. Room air Cardiovascular: Regular rate and rhythm, no murmurs / rubs / gallops. No extremity edema.  Abdomen: no tenderness, no masses palpated. Bowel sounds positive.  PEG tube in place tolerating solid diet although in small portions at 50 - 75% of meals Musculoskeletal: no clubbing / cyanosis. No contractures. Normal muscle tone.  Skin: no rashes, lesions, ulcers. No induration Neurologic: CN 2-12 grossly intact, speech patterns continue with  disfluency and evidence of perseveration; sensation grossly intact, no evidence of extremity spasms on exam at rest.  Moving all extremities spontaneously x4.  Patient left hand dominant and left side strength including shoulder  shrug appears to be weaker at 3/5 with right side strength 4/5. Psychiatric: Alert and oriented only to name.  Pleasant affect today   Assessment/Plan: V. fib arrest due to anterior MI: -s/p cardiac cath- aneurysm proximal LAD with dissection was culprit. -Continue aspirin,  Lipitor,Coreg after discharge-plan to send prescriptions to [facility]   Acute systolic congestive heart failure with EF 35% 2/2 MI.   -Daily weights with strict I's/O- has remained stable from a respiratory standpoint with persistent positive balance; unclear true dry weight noting at time of admission patient weighed 228 pounds with current weight 189.3 pounds -Continue Coreg,Entresto Aldactone  and digoxin as above. -Echocardiogram was completed 8/18 and anticipate will need to be repeated in November which would be the 3 month mark  Prediabetes diabetes 2 in obese -HgbA1c 5.7 -With underlying systolic heart failure initiated Farxiga 5 mg daily during this admission but discontinued in context of hypoglycemia   Acute encephalopathy/acute delirium 2/2 anoxic brain injury:   -Had extensive work-up with EEG, MRI brain.   -Initially trach and PEG dependent.  But thus far has been decannulated -Oral intake remains inconsistent therefore PEG remains in place -Continues to have episodes of confusion, agitation as well as amnesia.  -Due to lethargy scheduled Vistaril discontinued previously -Spasticity during PT and OT sessions delay improved.  Due to excessive daytime sleepiness Skelaxin has been changed to HS. -According to her mother she has been having issues with impulsive behavior with agitation that is not responsive to IM Vistaril.  Due to daytime sedation schedule oral Vistaril previously discontinued.  1/19 p.m. dose of Trileptal increased to 150 mg -Continue speech therapy for language and cognition after discharge  Dysphagia -SLP will continue to follow after discharge and patient currently on dysphagia 3 diet -Continue supplementation Ensure-orally or per tube if necessary  Physical deconditioning 2/2 anoxic brain injury -PT/OT documents slow but continued improvement during therapy sessions.  -OT has fitted patient for bilateral hand splints will be continued after  discharge -Patient will have brief home health PT and OT but will need to transition to outpatient PT and OT given likely chronic duration of patient's ongoing rehabilitative needs in relation to her anoxic brain injury  Nutrition Status: Nutrition Problem: Increased nutrient needs Etiology: post-op healing Signs/Symptoms: estimated needs Interventions: Tube feeding Estimated body mass index is 27.08 kg/m as calculated from the following:   Height as of this encounter: 5\' 10"  (1.778 m).   Weight as of this encounter: 85.6 kg.   Hypokalemia/hypomagnesemia:  -Magnesium on 10/17 was 2.0-continue replacement   Hypertension:  -BPs fairly stable in 90s to 100s.  -Entresto dose discontinued for suboptimal blood pressure readings   -Continue Coreg, Aldactone and Entresto.   History of SVT  -Maintaining sinus rhythm with beta-blocker described above      Data Reviewed: Basic Metabolic Panel: Recent Labs  Lab 11/23/19 0111 11/23/19 1042  NA 139  --   K 3.4*  --   CL 104  --   CO2 23  --   GLUCOSE 98  --   BUN 11  --   CREATININE 0.70  --   CALCIUM 9.6  --   MG  --  2.0   Liver Function Tests: No results for input(s): AST, ALT, ALKPHOS, BILITOT, PROT, ALBUMIN in the last 168 hours. No results for input(s): LIPASE, AMYLASE in the last 168 hours. No results for input(s): AMMONIA in the last 168 hours. CBC: Recent Labs  Lab 11/23/19 0111  WBC 7.4  HGB 11.0*  HCT 35.7*  MCV 82.1  PLT 616*   Cardiac Enzymes: No results for input(s): CKTOTAL, CKMB, CKMBINDEX, TROPONINI in the last 168 hours. BNP (last 3 results) Recent Labs    10/13/19 0440 10/14/19 0500 10/15/19 0552  BNP 186.1* 200.4* 168.9*    ProBNP (last 3 results) No results for input(s): PROBNP in the last 8760 hours.  CBG: Recent Labs  Lab 11/26/19 1949 11/26/19 2305 11/27/19 0424 11/27/19 0810 11/27/19 1209  GLUCAP 91 97 90 100* 90    No results found for this or any previous visit (from the  past 240 hour(s)).   Studies: No results found.  Scheduled Meds: . aspirin  81 mg Per Tube Daily  . atorvastatin  80 mg Per Tube Daily  . carvedilol  6.25 mg Per Tube BID WC  . digoxin  0.125 mg Per Tube Daily  . enoxaparin (LOVENOX) injection  40 mg Subcutaneous Q24H  . feeding supplement  237 mL Oral BID BM  . free water  200 mL Per Tube Q6H  . influenza vac split quadrivalent PF  0.5 mL Intramuscular Tomorrow-1000  . insulin aspart  0-15 Units Subcutaneous Q4H  . metaxalone  400 mg Oral QHS  . OXcarbazepine  150 mg Per Tube QHS  . OXcarbazepine  75 mg Per Tube q morning - 10a  . pantoprazole  40 mg Oral Daily  . prenatal multivitamin  1 tablet Per Tube Q1200  . sacubitril-valsartan  1 tablet Per Tube BID  . spironolactone  25 mg Per Tube Daily   Continuous Infusions: . sodium chloride Stopped (11/06/19 1900)    Active Problems:   Cardiac arrest (HCC)   Acute combined systolic and diastolic heart failure (HCC)   Ventricular tachycardia, polymorphic (HCC)   Encounter for central line placement   Respiratory failure (HCC)   Acute metabolic encephalopathy   Anoxic brain injury [facility])   Dysphagia   Physical deconditioning   Acute delirium   Prediabetes   Consultants:  Cardiology  PCCM  Interventional radiology  Neurology  CIR  Psychiatry  Procedures:  8/17 cardiac catheterization  8/18 EEG  8/18 echocardiogram 1. Septal and apical akinesis Distal anterior wall and mid/distal inferior wall hypokinesis Hyperdynamic basal function . Left ventricular ejection fraction, by estimation, is 30 to 35%. The left ventricle has moderately decreased function. The left ventricle demonstrates regional wall motion abnormalities (see scoring diagram/findings for description). The left ventricular internal cavity size was mildly dilated. Left ventricular diastolic parameters are indeterminate. 2. Right ventricular systolic function is normal. The right ventricular size is  normal. 3. The mitral valve is normal in structure. Trivial mitral valve regurgitation. No evidence of mitral stenosis. 4. The aortic valve was not well visualized. Aortic valve regurgitation is not visualized. No aortic stenosis is present. 5. The inferior vena cava is normal in size with greater  8/19 continuous EEG  8/22 EEG  8/31 EEG  8/31 gastrostomy tube placement per interventional radiology  Antibiotics: Anti-infectives (From admission, onward)   Start     Dose/Rate Route Frequency Ordered Stop   10/09/19 2100  vancomycin (VANCOREADY) IVPB 1500 mg/300 mL  Status:  Discontinued        1,500 mg 150 mL/hr over 120 Minutes Intravenous Every 12 hours 10/09/19 1237 10/10/19 1121   10/09/19 0800  ceFEPIme (MAXIPIME) 2 g in sodium chloride 0.9 % 100 mL IVPB        2 g 200 mL/hr over 30 Minutes Intravenous Every 8 hours 10/09/19  6578 10/15/19 2147   10/09/19 0800  vancomycin (VANCOREADY) IVPB 2000 mg/400 mL        2,000 mg 200 mL/hr over 120 Minutes Intravenous  Once 10/09/19 0752 10/09/19 1147   10/07/19 1540  ceFAZolin (ANCEF) 2-4 GM/100ML-% IVPB       Note to Pharmacy: Erie Noe   : cabinet override      10/07/19 1540 10/08/19 0344   10/07/19 1400  ceFAZolin (ANCEF) IVPB 2g/100 mL premix        2 g 200 mL/hr over 30 Minutes Intravenous To Radiology 10/06/19 1339 10/07/19 1627   09/30/19 0300  vancomycin (VANCOCIN) IVPB 1000 mg/200 mL premix  Status:  Discontinued        1,000 mg 200 mL/hr over 60 Minutes Intravenous Every 8 hours 09/29/19 1809 10/01/19 0853   09/29/19 1815  ceFEPIme (MAXIPIME) 2 g in sodium chloride 0.9 % 100 mL IVPB        2 g 200 mL/hr over 30 Minutes Intravenous Every 8 hours 09/29/19 1803 10/05/19 1801   09/29/19 1815  vancomycin (VANCOREADY) IVPB 2000 mg/400 mL        2,000 mg 200 mL/hr over 120 Minutes Intravenous  Once 09/29/19 1803 09/29/19 2137   09/24/19 2200  cefTRIAXone (ROCEPHIN) 2 g in sodium chloride 0.9 % 100 mL IVPB        2 g 200  mL/hr over 30 Minutes Intravenous Daily at bedtime 09/24/19 1201 09/28/19 2338   09/24/19 0330  cefTRIAXone (ROCEPHIN) 1 g in sodium chloride 0.9 % 100 mL IVPB  Status:  Discontinued        1 g 200 mL/hr over 30 Minutes Intravenous Daily at bedtime 09/24/19 0245 09/24/19 1201   09/24/19 0300  azithromycin (ZITHROMAX) 500 mg in sodium chloride 0.9 % 250 mL IVPB  Status:  Discontinued        500 mg 250 mL/hr over 60 Minutes Intravenous Daily at bedtime 09/24/19 0245 09/24/19 1103       Time spent: 35    Junious Silk ANP  [facility] Pager [phone]. If 7PM-7AM, please contact night-coverage at [email or web address] 11/27/2019, 1:28 PM  LOS: 64 days

## 2019-11-27 NOTE — TOC Progression Note (Addendum)
Transition of Care (TOC) - Progression Note  Donn Pierini RN, BSN Transitions of Care Unit 4E- RN Case Manager See Treatment Team for direct phone #    Patient Details  Name: Beverly [name] MRN: [id number] Date of Birth: 08-Mar-1985  Transition of [facility]) CM/SW Contact  Zenda Alpers, Lenn Sink, RN Phone Number: 11/27/2019, 3:56 PM  Clinical Narrative:    As per mom and family's decision to take pt home with [facility]- orders have been placed for DME and HH needs- have provided mom with list for [facility] choicePer CMS guidelines from medicare.gov website with star ratings (copy placed in shadow chart)- per mom she does not have a preference and defers to CM to try and secure and agency. Discussed DME needs with mom along with PT at bedside- will have DME delivered to home other than w/c which we will request to have delivered here to bedside. Address for delivery confirmed in Epic.  Rounding PA has completed PCS services paperwork and these have been faxed to the expedited line with [employer] to fax #[phone]- mom voices understanding that this is still a process that has to go through [place] for approval- Mom provided original paperwork.  Return call back to [facility] to have them f/u with mom today regarding disability questions- they are to call mom at the bedside.   Call made to [employer] line for DME needs- orders to be proceeded for w/c, 3n1, sliding board, platform walker, shower chair, and elevated toilet-   1330- notified by [employer] the Medicaid will not cover elevated toilet seat- mom made aware along with out of pocket cost should she want to go to  [employer] retail store.    Calls being made to [facility] agencies to see if services can be secured: [facility]- unable to staff in [place] area [facility]- [facility] with [employer]- unable to accept Medicaid at this time Christean Grief with Medicaid [facility]- unable to accept at this time [facility]- not servicing [place] area at this  time [facility]- not taking Medicaid in [place] area currently [facility]- Limited to referrals out of [facility] system currently [facility]- no availability in [place] at this time [facility]- unable to accept- no staffing availability   Discussed with mom about possibility of doing outpt therapies which mom was agreeable to referral to [facility] Neuro rehab- hopeful that [facility] can do some in home therapy first- and then change to outpt- will made outpt referral as per verbal order from A. Rennis Harding.   1530- all DME has been delivered to room per mom- mom feels like she should be able to get DME home in car. Per [employer] they will call mom regarding TF supplies.    TOC to f/u for final transition of care needs in am.    Expected Discharge Plan: IP Rehab Facility Barriers to Discharge: Barriers Resolved  Expected Discharge Plan and Services Expected Discharge Plan: IP Rehab Facility In-house Referral: Clinical Social Work Discharge Planning Services: CM Consult Post Acute Care Choice: Durable Medical Equipment, Home Health Living arrangements for the past 2 months: Apartment                 DME Arranged: 3-N-1, Hospital bed, Lightweight manual wheelchair with seat cushion, Other see comment, Tube feeding, Scientific laboratory technician, Eelevated commode seat DME Agency: [employer] Date DME Agency Contacted: 11/27/19 Time DME Agency Contacted: 1130 Representative spoke with at DME Agency: Silvio Pate HH Arranged: RN, PT, OT, Nurse's Aide, Social Work, Facilities manager Therapy  Date HH Agency Contacted: 11/27/19       Social Determinants of Health (SDOH) Interventions    Readmission Risk Interventions No flowsheet data found.

## 2019-11-27 NOTE — Progress Notes (Signed)
Mobility Specialist: Progress Note   11/27/19 1625  Mobility  Activity Ambulated in room  Level of Assistance +2 (takes two people) (Assist from RN and NT)  Assistive Device  (Animator)  Distance Ambulated (ft) 20 ft  Mobility Response Tolerated poorly  Mobility performed by Mobility specialist  Bed Position Semi-fowlers  $Mobility charge 1 Mobility   During Mobility: 129 HR Post-Mobility: 83 HR  Pt required frequent cues for hand placement and direction during ambulation. Pt struggled to follow cues from RN, NT, and mobility specialist to assist pt during ambulation. Pt back to bed with call bell at her side.   [facility] Wilma Michaelson Mobility Specialist

## 2019-11-27 NOTE — Progress Notes (Cosign Needed)
   Durable Medical Equipment (From admission, onward)     For home use only DME standard manual wheelchair with seat cushion  Once      Comments: Patient suffers from toxic brain injury after ventricular fibrillation cardiac arrest which impairs their ability to perform daily activities like mobilizing to bathroom, other living areas of the home, mobilizing to kitchen ni the home.  A platform walker will not resolve issue with performing activities of daily living to ongoing issues of ambulatory dysfunction and increased risk for falls with prolonged activity. A wheelchair will allow patient to safely perform daily activities. Patient can safely propel the wheelchair in the home or has a caregiver who can provide assistance. Length of need estimated at least 12 months, potentially longer depending on patient's progress Accessories: elevating leg rests (ELRs), wheel locks, extensions and anti-tippers. Back cushion, gel foam 2" seat cushion, desk length swing away arm rests  11/27/19 1143  11/27/19 0836   For home use only DME wheelchair cushion (seat and back)  Once       11/27/19 0835  11/27/19 0832    For home use only DME Hospital bed  Once      Question Answer Comment Length of Need 12 Months  Patient has (list medical condition): Anoxic brain injury with sequela of dysphagia, dilatory dysfunction, impulsivity and mood swings  The above medical condition requires: Patient requires the ability to reposition frequently  Head must be elevated greater than: 30 degrees  Bed type Semi-electric  Support Surface: Gel Overlay    11/27/19 0835  11/27/19 0829   For home use only DME 3 n 1  Once       11/27/19 4098

## 2019-11-27 NOTE — Progress Notes (Signed)
Physical Therapy Treatment Patient Details Name: Beverly [name] MRN: [id number] DOB: March 10, 1985 Today's Date: 11/27/2019    History of Present Illness Pt is a 34 y.o. female admitted 09/23/19 with intermittent chest discomfort; of note, pt 2-weeks post-partum (had emergent C-section); while in ED, pt with VF cardiac arrest requiring intubation. Brain MRI 8/23 with no acute abnormality. S/p trach and PEG tube 8/30. Course complicated by encephalopathy, anoxic brain injury, enterobacterial PNA. Transferred out of ICU On 9/4. PMH includes HTN.    PT Comments    Pt admitted with above diagnosis. Pt too fatigued to work with therapist today.  Attempts but too lethargic. Mom present and discussed d/c plan as mom wants to take pt home.  REcommendations as below. Also contacted OT regarding adaptive equipment for feeding and for ADLs and he agreed to address.  Will provide some more education to mom in the am as well.  Mom was educated today that this PT recommends pt be a wheelchair level initiially when she goes home as pt is requiring +2 mod assist currently with ambulation. Mom verbalizes understanding.  Will have mom assist with ambulation in the am when pt is feeling better so that she can know that therapy should be walking with pt currently. Pt currently with functional limitations due to balance and endurance deficits. Pt will benefit from skilled PT to increase their independence and safety with mobility to allow discharge to the venue listed below.     Follow Up Recommendations  Supervision/Assistance - 24 hour;Home health PT;Outpatient PT (recommend whichever pt has more coverage for)     Equipment Recommendations  Wheelchair - 18x16 with elevating leg rests, desk swing away armrests, and anti tippers. 18x16 pressure relieving cushion - 2" gel or foam. Hospital bed depending on what CM finds out about rails;Rolling walker with 5" wheels;3in1 (PT) (bilateral platforms for RW, sliding board)     Recommendations for Other Services       Precautions / Restrictions Precautions Precautions: Fall Precaution Comments: impulsive, incontinence- wears diapers  Restrictions Weight Bearing Restrictions: No    Mobility  Bed Mobility Overal bed mobility: Needs Assistance Bed Mobility: Supine to Sit;Sit to Supine Rolling: Min assist         General bed mobility comments: rolled only and unable to arouse pt to work with PT.  Mom states this is the calmest she has been since being at hospital.   Transfers                    Ambulation/Gait                 Stairs             Wheelchair Mobility    Modified Rankin (Stroke Patients Only)       Balance                                            Cognition Arousal/Alertness: Lethargic   Overall Cognitive Status: Impaired/Different from baseline Area of Impairment: Attention;Memory;Safety/judgement;Following commands;Awareness;Problem solving;Orientation               Rancho Levels of Cognitive Functioning Ranch[place] Mirant Scales of Cognitive Functioning: Confused/inappropriate/non-agitated Orientation Level: Disoriented to;Time Current Attention Level: Sustained Memory: Decreased recall of precautions;Decreased short-term memory Following Commands: Follows one step commands with increased time Safety/Judgement: Decreased awareness of safety;Decreased awareness of  deficits Awareness: Intellectual Problem Solving: Slow processing;Difficulty sequencing;Requires verbal cues;Requires tactile cues        Exercises      General Comments        Pertinent Vitals/Pain Pain Assessment: No/denies pain    Home Living                      Prior Function            PT Goals (current goals can now be found in the care plan section) Progress towards PT goals: Progressing toward goals    Frequency    Min 3X/week      PT Plan Discharge plan needs to be  updated    Co-evaluation              AM-PAC PT "6 Clicks" Mobility   Outcome Measure  Help needed turning from your back to your side while in a flat bed without using bedrails?: A Little Help needed moving from lying on your back to sitting on the side of a flat bed without using bedrails?: A Little Help needed moving to and from a bed to a chair (including a wheelchair)?: A Lot Help needed standing up from a chair using your arms (e.g., wheelchair or bedside chair)?: A Lot Help needed to walk in hospital room?: A Lot Help needed climbing 3-5 steps with a railing? : Total 6 Click Score: 13    End of Session   Activity Tolerance: Patient limited by lethargy Patient left: in bed;with call bell/phone within reach;with family/visitor present;with bed alarm set Nurse Communication: Mobility status PT Visit Diagnosis: Other abnormalities of gait and mobility (R26.89);Difficulty in walking, not elsewhere classified (R26.2);Other symptoms and signs involving the nervous system (R29.898)     Time: 1856-3149 PT Time Calculation (min) (ACUTE ONLY): 20 min  Charges:  $Self Care/Home Management: 8-22                     Beverly [name],PT Acute Rehabilitation Services Pager:  [phone]  Office:  [phone]     Beverly [name] 11/27/2019, 1:46 PM

## 2019-11-28 ENCOUNTER — Other Ambulatory Visit (HOSPITAL_COMMUNITY): Payer: Self-pay | Admitting: Nurse Practitioner

## 2019-11-28 LAB — BASIC METABOLIC PANEL
Anion gap: 13 (ref 5–15)
BUN: 8 mg/dL (ref 6–20)
CO2: 21 mmol/L — ABNORMAL LOW (ref 22–32)
Calcium: 9.4 mg/dL (ref 8.9–10.3)
Chloride: 103 mmol/L (ref 98–111)
Creatinine, Ser: 0.78 mg/dL (ref 0.44–1.00)
GFR, Estimated: >60 mL/min (ref >60)
Glucose, Bld: 93 mg/dL (ref 70–99)
Potassium: 3.2 mmol/L — ABNORMAL LOW (ref 3.5–5.1)
Sodium: 137 mmol/L (ref 135–145)

## 2019-11-28 LAB — DIGOXIN LEVEL: Digoxin Level: 0.6 ng/mL — ABNORMAL LOW (ref 1.0–2.0)

## 2019-11-28 LAB — GLUCOSE, CAPILLARY
Glucose-Capillary: 87 mg/dL (ref 70–99)
Glucose-Capillary: 96 mg/dL (ref 70–99)

## 2019-11-28 MED ORDER — PANTOPRAZOLE SODIUM 40 MG PO TBEC: 40.0000 mg | DELAYED_RELEASE_TABLET | Freq: Every day | ORAL | Status: DC

## 2019-11-28 MED ORDER — DIGOXIN 125 MCG PO TABS: 0.1250 mg | ORAL_TABLET | Freq: Every day | ORAL | 3 refills | Status: DC

## 2019-11-28 MED ORDER — PRENATAL MULTIVITAMIN CH: 1.0000 | ORAL_TABLET | Freq: Every day | ORAL | 3 refills | Status: DC

## 2019-11-28 MED ORDER — CARVEDILOL 6.25 MG PO TABS: 6.2500 mg | ORAL_TABLET | Freq: Two times a day (BID) | ORAL | 3 refills | Status: DC

## 2019-11-28 MED ORDER — FREE WATER: 200.0000 mL | Freq: Four times a day (QID) | Status: AC

## 2019-11-28 MED ORDER — ATORVASTATIN CALCIUM 80 MG PO TABS: 80.0000 mg | ORAL_TABLET | Freq: Every day | ORAL | 3 refills | Status: DC

## 2019-11-28 MED ORDER — MELATONIN 3 MG PO TABS: 3.0000 mg | ORAL_TABLET | Freq: Every evening | ORAL | 0 refills | Status: DC | PRN

## 2019-11-28 MED ORDER — POTASSIUM CHLORIDE 20 MEQ/15ML (10%) PO SOLN: 40.0000 meq | Freq: Once | ORAL | Status: DC

## 2019-11-28 MED ORDER — OXCARBAZEPINE 150 MG PO TABS: 150.0000 mg | ORAL_TABLET | Freq: Every day | ORAL | 3 refills | Status: DC

## 2019-11-28 MED ORDER — ENSURE ENLIVE PO LIQD
237.0000 mL | Freq: Two times a day (BID) | ORAL | 12 refills | Status: DC
Start: 2019-11-28 — End: 2020-12-23

## 2019-11-28 MED ORDER — OXCARBAZEPINE 150 MG PO TABS: 75.0000 mg | ORAL_TABLET | Freq: Every morning | ORAL | 3 refills | Status: DC

## 2019-11-28 MED ORDER — SACUBITRIL-VALSARTAN 24-26 MG PO TABS: 1.0000 | ORAL_TABLET | Freq: Two times a day (BID) | ORAL | 3 refills | Status: DC

## 2019-11-28 MED ORDER — ACETAMINOPHEN 160 MG/5ML PO SOLN: 650.0000 mg | ORAL | 0 refills | Status: DC | PRN

## 2019-11-28 MED ORDER — ASPIRIN 81 MG PO CHEW: 81.0000 mg | CHEWABLE_TABLET | Freq: Every day | ORAL | 3 refills | Status: DC

## 2019-11-28 MED ORDER — METAXALONE 400 MG PO TABS: 400.0000 mg | ORAL_TABLET | Freq: Every day | ORAL | 3 refills | Status: DC

## 2019-11-28 MED ORDER — SPIRONOLACTONE 25 MG PO TABS: 25.0000 mg | ORAL_TABLET | Freq: Every day | ORAL | 3 refills | Status: DC

## 2019-11-28 MED ORDER — PANTOPRAZOLE SODIUM 40 MG PO TBEC
40.0000 mg | DELAYED_RELEASE_TABLET | Freq: Every day | ORAL | 3 refills | Status: DC
Start: 2019-11-28 — End: 2019-11-28

## 2019-11-28 MED FILL — ENTRESTO 24 MG-26 MG TABLET: 24-26 | 30 days supply | Qty: 60 | Fill #0

## 2019-11-28 MED FILL — PANTOPRAZOLE SOD DR 40 MG T: 40 | 30 days supply | Qty: 30 | Fill #0

## 2019-11-28 MED FILL — CARVEDILOL 6.25 MG TABLET: 6.25 | 30 days supply | Qty: 60 | Fill #0

## 2019-11-28 MED FILL — ATORVASTATIN CALCIUM 80 MG: 80 | 30 days supply | Qty: 30 | Fill #0

## 2019-11-28 MED FILL — SM CHLD PAIN-FEVER 160 MG/5: 160 | 2 days supply | Qty: 118 | Fill #0

## 2019-11-28 MED FILL — OXcarbazepine 150 MG TABS: 150 | 30 days supply | Qty: 45 | Fill #0

## 2019-11-28 MED FILL — ASPIRIN LOW DOSE 81 MG CHEW: 81 | 30 days supply | Qty: 30 | Fill #0

## 2019-11-28 MED FILL — DIGOXIN 0.125 MG TABLET: 125 | 30 days supply | Qty: 30 | Fill #0

## 2019-11-28 MED FILL — SPIRONOLACTONE 25 MG TABLET: 25 | 30 days supply | Qty: 30 | Fill #0

## 2019-11-28 NOTE — TOC Transition Note (Signed)
Transition of Care (TOC) - CM/SW Discharge Note Donn Pierini RN, BSN Transitions of Care Unit 4E- RN Case Manager See Treatment Team for direct phone #    Patient Details  Name: Beverly [name] MRN: [id number] Date of Birth: [birth year]  Transition of Care [facility]) CM/SW Contact:  Darrold Span, RN Phone Number: 11/28/2019, 12:34 PM   Clinical Narrative:    Pt stable for transition home today, all DME has been delivered to room, Medicaid will not cover cost for TF- mom plans to buy from retail store out of pocket.  Call made to [facility]- they also are unable to accept referral as they are unable to bill Medicaid for services at this time.    After calling all HH agencies that service [place] area- unable to secure [facility] - mom aware and agreeable to outpt referral to [facility] re[facility]- PT/OT/SLP- verbal given by A. Rennis Harding- NP for oupt neuro rehab referral. Referral placed via epic to [facility] for outpt PT/OT/SLP- mom provided with contact info.   TOC pharmacy to deliver meds to bedside  [facility] office also f/u with mom this am regarding disability.  PT providing bedside education with mom on DME.    Final next level of care: Home w Home Health Services Barriers to Discharge: No Home Care Agency will accept this patient   Patient Goals and CMS Choice Patient states their goals for this hospitalization and ongoing recovery are:: return home CMS Medicare.gov Compare Post Acute Care list provided to:: Patient Represenative (must comment) (mother) Choice offered to / list presented to : Parent  Discharge Placement                 Home      Discharge Plan and Services In-house Referral: Clinical Social Work Discharge Planning Services: CM Consult Post Acute Care Choice: Durable Medical Equipment, Home Health          DME Arranged: 3-N-1, Hospital bed, Lightweight manual wheelchair with seat cushion, Other see comment, Tube feeding, Scientific laboratory technician, Eelevated  commode seat DME Agency: [employer] Date DME Agency Contacted: 11/27/19 Time DME Agency Contacted: 1130 Representative spoke with at DME Agency: Silvio Pate HH Arranged: RN, PT, OT, Nurse's Aide, Social Work, Speech Therapy   Date HH Agency Contacted: 11/27/19      Social Determinants of Health (SDOH) Interventions     Readmission Risk Interventions Readmission Risk Prevention Plan 11/28/2019  Transportation Screening Complete  PCP or Specialist Appt within 3-5 Days Not Complete  Not Complete comments clinic not scheduling until Dec. - mom to f/u  HRI or Home Care Consult Complete  Social Work Consult for Recovery Care Planning/Counseling Complete  Palliative Care Screening Not Applicable  Medication Review Oceanographer) Complete  Some recent data might be hidden

## 2019-11-28 NOTE — Plan of Care (Signed)
  Problem: Education: Goal: Knowledge of General Education information will improve Description: Including pain rating scale, medication(s)/side effects and non-pharmacologic comfort measures Outcome: Adequate for Discharge   

## 2019-11-28 NOTE — Progress Notes (Signed)
Discharge instructions (including medications) discussed with and copy provided to patient/caregiver 

## 2019-11-28 NOTE — Progress Notes (Signed)
11/28/19 1300  PT Visit Information  Last PT Received On 11/28/19  Assistance Needed +2  History of Present Illness Pt is a 34 y.o. female admitted 09/23/19 with intermittent chest discomfort; of note, pt 2-weeks post-partum (had emergent C-section); while in ED, pt with VF cardiac arrest requiring intubation. Brain MRI 8/23 with no acute abnormality. S/p trach and PEG tube 8/30. Course complicated by encephalopathy, anoxic brain injury, enterobacterial PNA. Transferred out of ICU On 9/4. PMH includes HTN.  Subjective Data  Patient Stated Goal Preparing for discharge home with mother/family  Precautions  Precautions Fall  Precaution Comments impulsive, incontinence- wears diapers   Restrictions  Weight Bearing Restrictions No  Pain Assessment  Pain Assessment No/denies pain  Cognition  Arousal/Alertness Awake/alert  Behavior During Therapy Impulsive  Overall Cognitive Status Impaired/Different from baseline  Area of Impairment Attention;Memory;Safety/judgement;Following commands;Awareness;Problem solving;Orientation  Orientation Level Disoriented to;Time;Situation  Current Attention Level Sustained  Memory Decreased recall of precautions;Decreased short-term memory  Following Commands Follows one step commands with increased time;Follows one step commands inconsistently  Safety/Judgement Decreased awareness of safety;Decreased awareness of deficits  Awareness Intellectual  Problem Solving Slow processing;Difficulty sequencing;Requires verbal cues;Requires tactile cues  Rancho Levels of Cognitive Functioning  Rancho Los Amigos Scales of Cognitive Functioning V  Bed Mobility  Overal bed mobility Needs Assistance  Bed Mobility Supine to Sit;Sit to Supine  Rolling Min assist;Min guard  Supine to sit Min assist  Sit to supine Min guard  General bed mobility comments Mom demonstrates appropriate technique for bed mobility and guarding pt.  Had to show mom how to set up wheelchair for  transfers.   Transfers  Overall transfer level Needs assistance  Equipment used Bilateral platform walker  Transfers Sit to/from Stand;Lateral/Scoot Transfers  Sit to Stand Mod assist;+2 physical assistance  Stand pivot transfers Min assist  General transfer comment Mom demonstrated multiple safe stand pivot transfers with pt. Disccussed with mom to place the 3N1 at [facility] and the wheelchair at foot of bed for room set up.  Mom aware that pt is to be wheelchair level initially at home for safety.   Mom is able with gait belt use to demonstate good technique guarding pt and providing min assist for stand pivot transfers.  Spent incr time educating pt and mom on wheelchair parts as well to include brakes, anti-tippers, moving armrests, applying foot rests and placed a safety belt on chair for pt so she would not stand up on her own.    Ambulation/Gait  General Gait Details deferred as session focused on education with mom for home.  Engineering geologist Yes  Wheelchair propulsion Both upper extremities  Wheelchair parts Needs assistance  Distance 50  Wheelchair Assistance Details (indicate cue type and reason) Pt needs mod cues to lock brakes and unlock.  Pt needs cues to push wheelchair as well but was able to advance wheelchair.  Pt also practiced turns. Pt could recall where brakes were vs pushing the chair.   Balance  Overall balance assessment Needs assistance  Sitting-balance support No upper extremity supported;Feet supported  Sitting balance-Leahy Scale Fair  Sitting balance - Comments Sat EOB with min guard assist as pt still has  loss of balance posteriorly intiially.   Postural control Posterior lean  Standing balance support Bilateral upper extremity supported  Standing balance-Leahy Scale Poor  Standing balance comment relies on UE support and external support due to bil UE and LE uncoordination. Pt stood to sink from wheelchair to the  bil PFRW with mod to min  assist of 2 and then was min gaurd assist to brush teeth. Had to have help with the toothpast but was able to brush teeth on her own.  Mom educated to only stand pt like this with 2 persons as pt does tend to have most difficulty with transtiion of sit to stand.  Pt stood a total of 10 min.   PT - End of Session  Equipment Utilized During Treatment Gait belt;Other (comment) (wheelchair)  Activity Tolerance Patient tolerated treatment well  Patient left in bed;with call bell/phone within reach;with family/visitor present;with bed alarm set  Nurse Communication Mobility status   PT - Assessment/Plan  PT Plan Current plan remains appropriate  PT Visit Diagnosis Other abnormalities of gait and mobility (R26.89);Difficulty in walking, not elsewhere classified (R26.2);Other symptoms and signs involving the nervous system (R29.898)  PT Frequency (ACUTE ONLY) Min 3X/week  Recommendations for Other Services OT consult  Follow Up Recommendations Supervision/Assistance - 24 hour;Home health PT;Outpatient PT (recommend whichever pt has more coverage for)  PT equipment Wheelchair (measurements PT);Hospital bed;Rolling walker with 5" wheels;3in1 (PT) (bilateral platforms, sliding board)  AM-PAC PT "6 Clicks" Mobility Outcome Measure (Version 2)  Help needed turning from your back to your side while in a flat bed without using bedrails? 3  Help needed moving from lying on your back to sitting on the side of a flat bed without using bedrails? 3  Help needed moving to and from a bed to a chair (including a wheelchair)? 3  Help needed standing up from a chair using your arms (e.g., wheelchair or bedside chair)? 2  Help needed to walk in hospital room? 2  Help needed climbing 3-5 steps with a railing?  1  6 Click Score 14  Consider Recommendation of Discharge To: CIR/SNF/LTACH  PT Goal Progression  Progress towards PT goals Progressing toward goals  PT Time Calculation  PT Start Time (ACUTE ONLY) 1138  PT  Stop Time (ACUTE ONLY) 1225  PT Time Calculation (min) (ACUTE ONLY) 47 min  PT General Charges  $$ ACUTE PT VISIT 1 Visit  PT Treatments  $Therapeutic Activity 8-22 mins  $Self Care/Home Management 8-22  $Wheel Chair Management 8-22 mins  Further education completed with mom.  Mom feels confident with mobility and equipment for home.   Dois Juarbe W,PT Acute Rehabilitation Services Pager:  [phone]  Office:  [phone]

## 2019-11-28 NOTE — Progress Notes (Signed)
Physical Therapy Treatment Patient Details Name: Beverly [name] MRN: [id number] DOB: Mar 07, 1985 Today's Date: 11/28/2019    History of Present Illness Pt is a 34 y.o. female admitted 09/23/19 with intermittent chest discomfort; of note, pt 2-weeks post-partum (had emergent C-section); while in ED, pt with VF cardiac arrest requiring intubation. Brain MRI 8/23 with no acute abnormality. S/p trach and PEG tube 8/30. Course complicated by encephalopathy, anoxic brain injury, enterobacterial PNA. Transferred out of ICU On 9/4. PMH includes HTN.    PT Comments    Pt admitted with above diagnosis. Session spent educating mom regarding home equipment and mobility.  Mom practiced transfers with and without sliding board and without board is easiest for mom currently.  Practiced wheelchair mobility as well.  Discussed safety issues with mom and spent incr time with education.  Needs another session and plan to return at 1130 this am.   Pt currently with functional limitations due to balance and endurance deficits. Pt will benefit from skilled PT to increase their independence and safety with mobility to allow discharge to the venue listed below.     Follow Up Recommendations  Supervision/Assistance - 24 hour;Home health PT;Outpatient PT (recommend whichever pt has more coverage for)     Equipment Recommendations  Wheelchair (measurements PT);Hospital bed;Rolling walker with 5" wheels;3in1 (PT) (bilateral platforms, sliding board)    Recommendations for Other Services       Precautions / Restrictions Precautions Precautions: Fall Precaution Comments: impulsive, incontinence- wears diapers  Restrictions Weight Bearing Restrictions: No    Mobility  Bed Mobility Overal bed mobility: Needs Assistance Bed Mobility: Supine to Sit;Sit to Supine Rolling: Min assist;Min guard   Supine to sit: Min assist Sit to supine: Min guard   General bed mobility comments: Pt with BM in diaper and had to  change pt - mom can complete.  Mom did all of mobility with pt today.  Cued mom to incr her cues so that pt knows clearly what she should do.   Transfers Overall transfer level: Needs assistance Equipment used: Bilateral platform walker Transfers: Sit to/from Stand;Lateral/Scoot Transfers Sit to Stand: Mod assist;+2 physical assistance Stand pivot transfers: Min assist      Lateral/Scoot Transfers: Mod assist;Min assist;With slide board General transfer comment: Practiced with sliding board which with cues pt does comply however at times pt just wants to stand up therefore had mom practice stand pivot transfers with pt and mom.  Mom is able with gait belt use to demonstate good technique guarding pt and providing min assist for stand pivot transfers.  Spent incr time educating pt and mom on wheelchair parts as well to include brakes, anti-tippers, moving armrests, applying foot rests and placed a safety belt on chair for pt so she would not stand up on her own.    Ambulation/Gait             General Gait Details: deferred as session focused on education with mom for home.   Stairs             Merchant navy officer mobility: Yes Wheelchair propulsion: Both upper extremities Wheelchair parts: Needs assistance Distance: 25 Wheelchair Assistance Details (indicate cue type and reason): Pt needs mod cues to lock brakes and unlock.  Pt needs cues to push wheelchair as well but was able to advance wheelchair.   Modified Rankin (Stroke Patients Only)       Balance Overall balance assessment: Needs assistance Sitting-balance support: No upper extremity supported;Feet supported Sitting  balance-Leahy Scale: Fair Sitting balance - Comments: Sat EOB with min guard assist as pt still has  loss of balance posteriorly intiially.  Postural control: Posterior lean Standing balance support: Bilateral upper extremity supported Standing balance-Leahy Scale:  Poor Standing balance comment: relies on UE support and external support due to bil UE and LE uncoordination.                             Cognition Arousal/Alertness: Awake/alert Behavior During Therapy: Impulsive Overall Cognitive Status: Impaired/Different from baseline Area of Impairment: Attention;Memory;Safety/judgement;Following commands;Awareness;Problem solving;Orientation                 Orientation Level: Disoriented to;Time;Situation Current Attention Level: Sustained Memory: Decreased recall of precautions;Decreased short-term memory Following Commands: Follows one step commands with increased time;Follows one step commands inconsistently Safety/Judgement: Decreased awareness of safety;Decreased awareness of deficits Awareness: Intellectual Problem Solving: Slow processing;Difficulty sequencing;Requires verbal cues;Requires tactile cues General Comments: Pt states she is still pregnant.Unaware that she recently had a baby.      Exercises      General Comments General comments (skin integrity, edema, etc.): Issued 2 gait belts - one for transfers and one to use a seat belt for chair.       Pertinent Vitals/Pain Pain Assessment: No/denies pain    Home Living                      Prior Function            PT Goals (current goals can now be found in the care plan section) Acute Rehab PT Goals Patient Stated Goal: Preparing for discharge home with mother/family Progress towards PT goals: Progressing toward goals    Frequency    Min 3X/week      PT Plan Current plan remains appropriate    Co-evaluation              AM-PAC PT "6 Clicks" Mobility   Outcome Measure  Help needed turning from your back to your side while in a flat bed without using bedrails?: A Little Help needed moving from lying on your back to sitting on the side of a flat bed without using bedrails?: A Little Help needed moving to and from a bed to a  chair (including a wheelchair)?: A Little Help needed standing up from a chair using your arms (e.g., wheelchair or bedside chair)?: A Lot Help needed to walk in hospital room?: A Lot Help needed climbing 3-5 steps with a railing? : Total 6 Click Score: 14    End of Session Equipment Utilized During Treatment: Gait belt;Other (comment) (wheelchair) Activity Tolerance: Patient tolerated treatment well Patient left: in bed;with call bell/phone within reach;with family/visitor present;with bed alarm set Nurse Communication: Mobility status PT Visit Diagnosis: Other abnormalities of gait and mobility (R26.89);Difficulty in walking, not elsewhere classified (R26.2);Other symptoms and signs involving the nervous system (R29.898)     Time: 7829-5621 PT Time Calculation (min) (ACUTE ONLY): 47 min  Charges:  $Therapeutic Activity: 8-22 mins $Self Care/Home Management: 8-22 $Wheel Chair Management: 8-22 mins                     Lorece Keach W,PT Acute Rehabilitation Services Pager:  [phone]  Office:  [phone]     Berline Lopes 11/28/2019, 1:27 PM

## 2019-11-28 NOTE — Discharge Summary (Addendum)
Physician Discharge Summary  Beverly [name]:[id number] DOB: 02-25-1985 DOA: 09/24/2019  PCP: [name]  Admit date: 09/24/2019 Discharge date: 11/28/2019  Time spent: 55 minutes  Recommendations for Outpatient Follow-up:  1. Patient will discharge to home with home health services of PT/OT/SLP.  Case management has placed referral to outpatient neuro rehab to continue the services after home health has expired 2. 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  He has sequelae of systolic heart failure.  Cardiology contacted prior to discharge and will reach out to patient and family after discharge 3. Patient new recipient of Medicaid and has been [facility] to the [name] for PCP follow-up 4. Patient will need electrolyte panel and digoxin level within 2 weeks after discharge 5. OT prescribed upper extremity splints to be worn while sleeping and removed in the a.m. 6. Mobility at this time limited to wheelchair given impulsivity and increased risk for falls with using platform walker which has also been prescribed to use during therapy sessions intermittently at home 7. Patient also discharged home with a gastrostomy tube that is utilized intermittently for meds and some supplements.  Patient is on a dysphagia diet and requires 100% supervision with meals   Discharge Diagnoses:  Active Problems:   Cardiac arrest (HCC)   Acute combined systolic and diastolic heart failure (HCC)   Ventricular tachycardia, polymorphic (HCC)   Encounter for central line placement   Respiratory failure (HCC)   Acute metabolic encephalopathy   Anoxic brain injury (HCC)   Dysphagia   Physical deconditioning   Acute delirium   Prediabetes   Discharge Condition: Stable  Diet recommendation: Heart healthy low-sodium with dysphagia  3/thin liquids.  No fluid restrictions at this time  [facility] Weights   11/26/19 0408 11/27/19 0422 11/28/19 0505  Weight: 37.8 kg 85.6 kg 85.3 kg    History of present illness:  34 year old female presents to the hospital 2 weeks postpartum with chest pain and in the ED sustained V. fib arrest with V. fib x3 epi x1, EKG showed ST elevation underwent Cath Lab at [facility] showed aneurysmal dilation(dissection?) of the LAD was identified. She was transferred to [facility] for ICU admission.LVEF30% EF in cath lab.   Hospital Course:  Significant events Echo 8/18>LVEF 30-35%, septal and apical akinesis distal anterior wall an d mid/distal inferior wall hypokinesis hyperdynamic basal function. LV size mildly dilated.  Patient suffered anoxic brain injury. 8.23:MRI brain  8/30 underwent tracheostomy 8/30 PEG tube placement 9/10: Continues to be encephalopathic, able to open eyes and track but not following any commands and not talking,is moving extremities independently 9/15: Tracheostomy downsized to # 4 cufless. 9/20:Trach CAP trial, PCCM following SNF planned for 9/30- 30 days out from trach 9/21:Patient is showing improvement with the speech therapy and PT-recommendation changed from SNF to inpatient rehab.  9/25: Trach removed.  V. fib arrest due to anterior MI: -s/pcardiac cath-aneurysm proximal LAD with dissection was culprit. -Continue aspirin, Lipitor,Coreg after discharge-plan to send prescriptions to [facility] pharmacy -Potassium was 3.2 on date of discharge with 40 mEq given per tube prior to discharge.  Again recommend electrolyte panel in 1 to 2 weeks after discharge.  Will not prescribe regular potassium since patient is on potassium sparing medication/spironolactone.  Acute systolic congestive heart failure with EF 35%2/2 MI.  -Daily weights with strict I's/O- has remained stable from a respiratory  standpoint with persistent positive balance; unclear true dry weight  noting at time of admission patient weighed 228 pounds with current weight 189.3 pounds -Continue Coreg,Entresto Aldactone and digoxin as above. -Echocardiogram was completed 8/18 and anticipate will need to be repeated in November which would be the 3 month mark  Prediabetes diabetes 2 in obese -HgbA1c 5.7 -With underlying systolic heart failure initiated Farxiga 5 mg daily during this admission but discontinued in context of hypoglycemia  Acute encephalopathy/acute delirium 2/2 anoxic brain injury:  -Had extensive work-up with EEG, MRI brain.  -Initially trach and PEG dependent.  But thus far has been decannulated -Acute delirium resolved -Oral intake remains inconsistent therefore PEG remains in place -Continues to have episodes of confusion, agitation as well as amnesia.  -Spasticity during PT and OT sessions improved.  Due to excessive daytime sleepiness Skelaxin has been changed to HS. unfortunately Skelaxin not covered by insurance and since patient symptoms are extremely mild have opted to discontinue this medication upon discharge -According to her mother she has been having issues with impulsive behavior with agitation that is not responsive to IM Vistaril.  Due to daytime sedation schedule oral Vistaril previously discontinued.  1/19 p.m. dose of Trileptal increased to 150 mg -Continue speech therapy for language and cognition after discharge -Due to the lack of appropriate bed offers family felt that patient would do better in the home environment and therefore have asked to take the patient home with home health services  Dysphagia -SLP will continue to follow after discharge and patient currently on dysphagia 3 diet -Continue supplementation Ensure-orally or per tube if necessary  Physical deconditioning 2/2 anoxic brain injury -PT/OT documents slow but continued improvement during therapy sessions.  -OT has fitted patient for bilateral hand splints will be continued  after discharge -Patient will have brief home health PT and OT but will need to transition to outpatient PT and OT given likely chronic duration of patient's ongoing rehabilitative needs in relation to her anoxic brain injury  Nutrition Status: Nutrition Problem: Increased nutrient needs Etiology: post-op healing Signs/Symptoms: estimated needs Interventions: Tube feeding Estimated body mass index is 27.08 kg/m as calculated from the following:   Height as of this encounter: 5\' 10"  (1.778 m).   Weight as of this encounter: 85.6 kg.   Hypokalemia/hypomagnesemia: -Magnesium on 10/17 was 2.0-continue replacement  Hypertension: -BPs fairly stable in 90s to 100s.  -Entresto dose discontinued for suboptimal blood pressure readings   -Continue Coreg, Aldactone and Entresto.  History of SVT  -Maintaining sinus rhythm with beta-blocker described above   Procedures:  8/17 cardiac catheterization  8/18 EEG  8/18 echocardiogram 1. Septal and apical akinesis Distal anterior wall and mid/distal inferior wall hypokinesis Hyperdynamic basal function . Left ventricular ejection fraction, by estimation, is 30 to 35%. The left ventricle has moderately decreased function. The left ventricle demonstrates regional wall motion abnormalities (see scoring diagram/findings for description). The left ventricular internal cavity size was mildly dilated. Left ventricular diastolic parameters are indeterminate. 2. Right ventricular systolic function is normal. The right ventricular size is normal. 3. The mitral valve is normal in structure. Trivial mitral valve regurgitation. No evidence of mitral stenosis. 4. The aortic valve was not well visualized. Aortic valve regurgitation is not visualized. No aortic stenosis is present. 5. The inferior vena cava is normal in size with greater  8/19 continuous EEG  8/22 EEG  8/31 EEG  8/31 gastrostomy tube placement per interventional  radiology   Consultations:  Cardiology  PCCM  Interventional radiology  Neurology  CIR  Psychiatry  Discharge Exam: Vitals:   11/28/19 0505 11/28/19 0826  BP: 108/69 120/87  Pulse: 63 79  Resp: 19 18  Temp: 98.7 F (37.1 C) (!) 97.4 F (36.3 C)  SpO2: 100% 99%   Constitutional: NAD, calm, comfortable  Respiratory: clear to auscultation bilaterally. Normal respiratory effort. Room air Cardiovascular: Regular rate and rhythm, no murmurs / rubs / gallops. No extremity edema.  Abdomen: no tenderness, no masses palpated. Bowel sounds positive.  PEG tube in place tolerating solid diet although in small portions at 50 - 75% of meals Musculoskeletal: no clubbing / cyanosis. No contractures. Normal muscle tone.  Skin: no rashes, lesions, ulcers. No induration-previous tracheostomy site well-healed Neurologic: CN 2-12 grossly intact, speech patterns continue with  disfluency and evidence of perseveration; sensation grossly intact, no evidence of extremity spasms on exam at rest.  Moving all extremities spontaneously and purposefully x4.  Patient left hand dominant and left side strength including shoulder shrug appears to be weaker at 3/5 with right side strength 4/5. Psychiatric: Alert and oriented only to name.  Pleasant affect today  Discharge Instructions   Discharge Instructions    (HEART FAILURE PATIENTS) Call MD:  Anytime you have any of the following symptoms: 1) 3 pound weight gain in 24 hours or 5 pounds in 1 week 2) shortness of breath, with or without a dry hacking cough 3) swelling in the hands, feet or stomach 4) if you have to sleep on extra pillows at night in order to breathe.   Complete by: As directed    Call MD for:  extreme fatigue   Complete by: As directed    Call MD for:  persistant dizziness or light-headedness   Complete by: As directed    Call MD for:  persistant nausea and vomiting   Complete by: As directed    Call MD for:  redness, tenderness, or  signs of infection (pain, swelling, redness, odor or green/yellow discharge around incision site)   Complete by: As directed    Call MD for:  temperature >100.4   Complete by: As directed    Diet - low sodium heart healthy   Complete by: As directed    Dysphagia 3 with thin liquids   Discharge instructions   Complete by: As directed    Please apply arm splints from OT at night and take off in the morning  As discussed with therapy, Zakariah continues to have significant balance issues even when using platform walker.  Therapist have recommend initially after discharge to mobilize primarily with wheelchair.  If she wishes to stand in front of the sink to perform ADLs she may use platform walker with 100% supervision  Please follow-up with MD office listed on Medicaid card to ensure outpatient physician follow-up for her general medical issues  The cardiology team has been made aware that she is discharging today and will notify you regarding hospital follow-up.  She definitely needs an echocardiogram in November to follow changes that occurred due to her heart condition at presentation.  Case management has placed referral to neuro rehab so she can continue aggressive OT/PT/speech and language treatments after her home visits have expired  Kendyl requires 100% supervision with eating  If she has not received her Covid vaccines it is recommended she obtain.  She is at high risk for complications from Covid if she should become infected therefore recommend limit visitors to those who are vaccinated and recommend mask wearing  with people who are not residing in the home.  Although she does have a condition called systolic and diastolic heart failure it is likely this was related to her heart attack physiology at presentation and should improve.  She is currently on medications to help with heart failure including a fluid pill.  If she develops nausea and vomiting or poor oral intake please notify  cardiology office or her primary care physician in the event some of these medications may need to be placed on hold temporarily  Make sure that you flush the feeding tube before and after administration of any medications or supplements.  She will also require boluses of regular water as documented in her medication list   Increase activity slowly   Complete by: As directed    No wound care   Complete by: As directed      Allergies as of 11/28/2019   No Known Allergies     Medication List    STOP taking these medications   acetaminophen 500 MG tablet Commonly known as: TYLENOL Replaced by: acetaminophen 160 MG/5ML solution   ibuprofen 200 MG tablet Commonly known as: ADVIL   oxyCODONE 5 MG immediate release tablet Commonly known as: Oxy IR/ROXICODONE     TAKE these medications   acetaminophen 160 MG/5ML solution Commonly known as: TYLENOL Place 20.3 mLs (650 mg total) into feeding tube every 4 (four) hours as needed for moderate pain, headache or fever. Replaces: acetaminophen 500 MG tablet   aspirin 81 MG chewable tablet Place 1 tablet (81 mg total) into feeding tube daily. Start taking on: November 29, 2019   atorvastatin 80 MG tablet Commonly known as: LIPITOR Place 1 tablet (80 mg total) into feeding tube daily. Start taking on: November 29, 2019   carvedilol 6.25 MG tablet Commonly known as: COREG Place 1 tablet (6.25 mg total) into feeding tube 2 (two) times daily with a meal.   digoxin 0.125 MG tablet Commonly known as: LANOXIN Place 1 tablet (0.125 mg total) into feeding tube daily. Start taking on: November 29, 2019   feeding supplement Liqd Take 237 mLs by mouth 2 (two) times daily between meals.   free water Soln Place 200 mLs into feeding tube every 6 (six) hours.   melatonin 3 MG Tabs tablet Place 1 tablet (3 mg total) into feeding tube at bedtime as needed (insomia/nocturnal agitation).   metaxalone 400 MG tablet Commonly known as:  SKELAXIN Place 1 tablet (400 mg total) into feeding tube at bedtime.   OXcarbazepine 150 MG tablet Commonly known as: TRILEPTAL Place 1 tablet (150 mg total) into feeding tube at bedtime.   OXcarbazepine 150 MG tablet Commonly known as: TRILEPTAL Place 0.5 tablets (75 mg total) into feeding tube every morning. Start taking on: November 29, 2019   pantoprazole 40 MG tablet Commonly known as: PROTONIX Take 1 tablet (40 mg total) by mouth daily.   prenatal multivitamin Tabs tablet Place 1 tablet into feeding tube daily at 12 noon. Start taking on: November 29, 2019   sacubitril-valsartan 24-26 MG Commonly known as: ENTRESTO Take 1 tablet by mouth 2 (two) times daily.   spironolactone 25 MG tablet Commonly known as: ALDACTONE Place 1 tablet (25 mg total) into feeding tube daily. Start taking on: November 29, 2019            Durable Medical Equipment  (From admission, onward)         Start     Ordered   11/27/19 1344  For home use only DME Other see comment  Once       Comments: 30 inch transfer/slide board  Question:  Length of Need  Answer:  Lifetime   11/27/19 1343   11/27/19 1147  For home use only DME Walker platform  Once       Comments: Left and right  Question:  Patient needs a walker to treat with the following condition  Answer:  Ambulatory dysfunction   11/27/19 1148   11/27/19 1146  For home use only DME Shower stool  Once       Comments: Transfer bench   11/27/19 1148   11/27/19 1144  For home use only DME Tube feeding  Once       Comments: Ensure Enlive or equivalent- 1 can per tube or PO (if tolerates) BID   11/27/19 1146   11/27/19 1142  For home use only DME standard manual wheelchair with seat cushion  Once       Comments: Patient suffers from toxic brain injury after ventricular fibrillation cardiac arrest which impairs their ability to perform daily activities like mobilizing to bathroom, other living areas of the home, mobilizing to kitchen ni the  home.  A platform walker will not resolve issue with performing activities of daily living to ongoing issues of ambulatory dysfunction and increased risk for falls with prolonged activity. A wheelchair will allow patient to safely perform daily activities. Patient can safely propel the wheelchair in the home or has a caregiver who can provide assistance. Length of need estimated at least 12 months, potentially longer depending on patient's progress Accessories: elevating leg rests (ELRs), wheel locks, extensions and anti-tippers. Back cushion, gel foam 2" seat cushion, desk length swing away arm rests   11/27/19 1143   11/27/19 0836  For home use only DME wheelchair cushion (seat and back)  Once        11/27/19 0835   11/27/19 0832  For home use only DME Eelevated commode seat  Once        11/27/19 0835   11/27/19 0830  For home use only DME Hospital bed  Once       Question Answer Comment  Length of Need 12 Months   Patient has (list medical condition): Anoxic brain injury with sequela of dysphagia, dilatory dysfunction, impulsivity and mood swings   The above medical condition requires: Patient requires the ability to reposition frequently   Head must be elevated greater than: 30 degrees   Bed type Semi-electric   Support Surface: Gel Overlay      11/27/19 0835   11/27/19 0829  For home use only DME 3 n 1  Once        11/27/19 0835         No Known Allergies  Follow-up Information    [facility] Call.   Specialty: General Practice Why: call end of Oct/ First of Nov to see if they have their December schedule available to make an appointment to be seen to establish care for primary doctor.  Contact information: [address] [place] [address] [phone]        [facility] Follow up.   Why: DME- Wheelchair, 3n1, walker, shower chair, sliding board- delivered to room, (hospital bed on hold- call them if you decide you need  it) Contact information: [address] [facility] [place] [address] [phone]        [facility] [address] Office. Call  in 2 week(s).   Specialty: Cardiology Why: Cardiology team aware of need for follow-up appointment in November.  If you have not been called with an appointment within 1 week after discharge please call their office Contact information: [address] [place] [address] [phone]       [facility] Follow up.   Specialty: Rehabilitation Why: outpt referral made for PT/OT/SLP- they will call and f/u for scheduling Contact information: [address] [id number] mc [place] [address] [phone]               The results of significant diagnostics from this hospitalization (including imaging, microbiology, ancillary and laboratory) are listed below for reference.    Significant Diagnostic Studies: EEG  Result Date: 11/13/2019 Charlsie Quest, MD     11/13/2019 12:37 PM Patient Name: Beverly [name] MRN: [id number] Epilepsy Attending: Charlsie Quest Referring Physician/Provider: Dr Bethel Born Date: 11/13/2019 Duration: 24.23 minutes Patient history: 34 year old female with seizure-like episode.  EEG to evaluate for seizures. Level of alertness: Awake, asleep AEDs during EEG study: Oxcarbazepine Technical aspects: This EEG study was done with scalp electrodes positioned according to the 10-20 International system of electrode placement. Electrical activity was acquired at a sampling rate of  and reviewed with a high frequency filter of  and a low frequency filter of . EEG data were recorded continuously and digitally stored. Description: The posterior dominant rhythm consists of 9 Hz activity of moderate voltage (25-35 uV) seen predominantly in posterior head regions, symmetric and reactive to eye opening and eye closing. Sleep was characterized  by vertex waves, sleep spindles (12 to 14 Hz), maximal frontocentral region. Physiologic photic driving was seen during photic stimulation.  Hyperventilation was not performed.   IMPRESSION: This study is within normal limits. No seizures or epileptiform discharges were seen throughout the recording. Charlsie Quest   CT HEAD WO CONTRAST  Result Date: 11/12/2019 CLINICAL DATA:  Delirium. EXAM: CT HEAD WITHOUT CONTRAST TECHNIQUE: Contiguous axial images were obtained from the base of the skull through the vertex without intravenous contrast. COMPARISON:  Head CT 09/23/2019.  Brain MRI 10/07/2019 FINDINGS: Brain: Slight motion artifact limitations. No intracranial hemorrhage, mass effect, or midline shift. No hydrocephalus. The basilar cisterns are patent. No evidence of territorial infarct or acute ischemia. Gray-white differentiation is preserved. No extra-axial or intracranial fluid collection. Vascular: No hyperdense vessel or unexpected calcification. Skull: No fracture or focal lesion. Sinuses/Orbits: Paranasal sinuses and mastoid air cells are clear. The visualized orbits are unremarkable. Other: None. IMPRESSION: Unremarkable noncontrast head CT allowing for mild motion artifact limitations. Electronically Signed   By: Narda Rutherford M.D.   On: 11/12/2019 22:10    Microbiology: No results found for this or any previous visit (from the past 240 hour(s)).   Labs: Basic Metabolic Panel: Recent Labs  Lab 11/23/19 0111 11/23/19 1042 11/28/19 0622  NA 139  --  137  K 3.4*  --  3.2*  CL 104  --  103  CO2 23  --  21*  GLUCOSE 98  --  93  BUN 11  --  8  CREATININE 0.70  --  0.78  CALCIUM 9.6  --  9.4  MG  --  2.0  --    Liver Function Tests: No results for input(s): AST, ALT, ALKPHOS, BILITOT, PROT, ALBUMIN in the last 168 hours. No results for input(s): LIPASE, AMYLASE in the last 168 hours. No results for input(s):  AMMONIA in the last 168 hours. CBC: Recent Labs  Lab 11/23/19 0111   WBC 7.4  HGB 11.0*  HCT 35.7*  MCV 82.1  PLT 616*   Cardiac Enzymes: No results for input(s): CKTOTAL, CKMB, CKMBINDEX, TROPONINI in the last 168 hours. BNP: BNP (last 3 results) Recent Labs    10/13/19 0440 10/14/19 0500 10/15/19 0552  BNP 186.1* 200.4* 168.9*    ProBNP (last 3 results) No results for input(s): PROBNP in the last 8760 hours.  CBG: Recent Labs  Lab 11/27/19 1649 11/27/19 2012 11/27/19 2306 11/28/19 0507 11/28/19 0822  GLUCAP 103* 79 99 87 96       Signed:  Junious Silk ANP [facility] 11/28/2019, 12:54 PM

## 2019-12-03 ENCOUNTER — Emergency Department: Payer: Medicaid Other

## 2019-12-03 ENCOUNTER — Emergency Department
Admission: EM | Admit: 2019-12-03 | Discharge: 2019-12-03 | Disposition: A | Discharge status: Home / Self Care | Payer: Medicaid Other | Attending: Emergency Medicine | Admitting: Emergency Medicine

## 2019-12-03 ENCOUNTER — Other Ambulatory Visit: Payer: Self-pay

## 2019-12-03 DIAGNOSIS — Z4659 Encounter for fitting and adjustment of other gastrointestinal appliance and device: Secondary | ICD-10-CM | POA: Insufficient documentation

## 2019-12-03 DIAGNOSIS — Z87891 Personal history of nicotine dependence: Secondary | ICD-10-CM | POA: Diagnosis not present

## 2019-12-03 DIAGNOSIS — G931 Anoxic brain damage, not elsewhere classified: Secondary | ICD-10-CM | POA: Diagnosis not present

## 2019-12-03 DIAGNOSIS — Z79899 Other long term (current) drug therapy: Secondary | ICD-10-CM | POA: Insufficient documentation

## 2019-12-03 HISTORY — DX: Unspecified intracranial injury with loss of consciousness status unknown, initial encounter: S06.9XAA

## 2019-12-03 HISTORY — DX: Unspecified intracranial injury with loss of consciousness of unspecified duration, initial encounter: S06.9X9A

## 2019-12-03 MED ORDER — IOHEXOL 300 MG/ML  SOLN
50.0000 mL | Freq: Once | INTRAMUSCULAR | Status: AC | PRN
  Administered 2019-12-03: 50 mL
  Filled 2019-12-03: qty 50

## 2019-12-03 NOTE — ED Notes (Signed)
This RN discussed pt with Dr D. Smith and Dr. Michiel Sites

## 2019-12-03 NOTE — ED Triage Notes (Signed)
Pt to ED via [facility] from home with mother for pulling out feeding tube.  Pt mother states pt just discharged from [facility] on Friday, pt has brain injury, non verbal.  Pt in NAD, RR even and unlabored.

## 2019-12-03 NOTE — ED Provider Notes (Signed)
[facility] Emergency Department Provider Note  ____________________________________________   First MD Initiated Contact with Patient 12/03/19 1202     (approximate)  I have reviewed the triage vital signs and the nursing notes.   HISTORY  Chief Complaint feeding tube   HPI Beverly [name] is a 34 y.o. female with a past medical history of DM and a recent anoxic brain injury secondary to V. fib arrest secondary to aneurysmal dissection of the LAD complicated by subsequent systolic heart failure (d/c 10/22) and dysphagia status post PEG tube placement approximately 2 months ago who presents accompanied by her mother with concerns that her PEG tube became dislodged morning.  History is limited from patient as she is nonverbal.  Per her mother patient was fumbling with her feeding tube and it came partially out this morning.  Patient's mother states she attempted to place the tube back in and it slid back in.  She states she is concerned it may be dislodged and is not sure if it is in place.  She has no other acute concerns and specifically denies any recent fevers, vomiting, diarrhea, cough, or other acute sick symptoms.  Patient receives medications and nutrition supplementation through PEG tube at this time.         Past Medical History:  Diagnosis Date  . Brain injury ([place])   . Hypertension    last pregnancy  . Vaginal Pap smear, abnormal    when she was 34yo    Patient Active Problem List   Diagnosis Date Noted  . Prediabetes   . Anoxic brain injury ([place]) 11/05/2019  . Dysphagia 11/05/2019  . Physical deconditioning 11/05/2019  . Acute delirium 11/05/2019  . Respiratory failure ([place])   . Acute metabolic encephalopathy   . Encounter for central line placement   . Cardiac arrest ([place]) 09/23/2019  . Acute combined systolic and diastolic heart failure ([place]) 09/23/2019  . Ventricular tachycardia, polymorphic ([place]) 09/23/2019  . Pelvic pain  affecting pregnancy 10/15/2015  . Supervision of normal pregnancy in third trimester 09/28/2015  . Poor weight gain of pregnancy 09/28/2015  . Iron deficiency anemia of pregnancy 09/01/2015  . Increased BMI (body mass index) 07/29/2015    Past Surgical History:  Procedure Laterality Date  . IR GASTROSTOMY TUBE MOD SED  10/07/2019  . LEFT HEART CATH AND CORONARY ANGIOGRAPHY N/A 09/23/2019   Procedure: LEFT HEART CATH AND CORONARY ANGIOGRAPHY;  Surgeon: Nelva Bush, MD;  Location: [place] [facility];  Service: Cardiovascular;  Laterality: N/A;    Prior to Admission medications   Medication Sig Start Date End Date Taking? Authorizing Provider  acetaminophen (TYLENOL) 160 MG/5ML solution Place 20.3 mLs (650 mg total) into feeding tube every 4 (four) hours as needed for moderate pain, headache or fever. 11/28/19   Samella Parr, NP  aspirin 81 MG chewable tablet Place 1 tablet (81 mg total) into feeding tube daily. 11/29/19   Samella Parr, NP  atorvastatin (LIPITOR) 80 MG tablet Place 1 tablet (80 mg total) into feeding tube daily. 11/29/19   Samella Parr, NP  carvedilol (COREG) 6.25 MG tablet Place 1 tablet (6.25 mg total) into feeding tube 2 (two) times daily with a meal. 11/28/19   Samella Parr, NP  digoxin (LANOXIN) 0.125 MG tablet Place 1 tablet (0.125 mg total) into feeding tube daily. 11/29/19   Samella Parr, NP  feeding supplement (ENSURE ENLIVE / ENSURE PLUS) LIQD Take 237 mLs by mouth 2 (two) times  daily between meals. 11/28/19   Samella Parr, NP  melatonin 3 MG TABS tablet Place 1 tablet (3 mg total) into feeding tube at bedtime as needed (insomia/nocturnal agitation). 11/28/19   Samella Parr, NP  metaxalone (SKELAXIN) 400 MG tablet Place 1 tablet (400 mg total) into feeding tube at bedtime. 11/28/19   Samella Parr, NP  OXcarbazepine (TRILEPTAL) 150 MG tablet Place 1 tablet (150 mg total) into feeding tube at bedtime. 11/28/19   Samella Parr, NP    OXcarbazepine (TRILEPTAL) 150 MG tablet Place 0.5 tablets (75 mg total) into feeding tube every morning. 11/29/19   Samella Parr, NP  pantoprazole (PROTONIX) 40 MG tablet Take 1 tablet (40 mg total) by mouth daily. 11/28/19   Samella Parr, NP  Prenatal Vit-Fe Fumarate-FA (PRENATAL MULTIVITAMIN) TABS tablet Place 1 tablet into feeding tube daily at 12 noon. 11/29/19   Samella Parr, NP  sacubitril-valsartan (ENTRESTO) 24-26 MG Take 1 tablet by mouth 2 (two) times daily. 11/28/19   Samella Parr, NP  spironolactone (ALDACTONE) 25 MG tablet Place 1 tablet (25 mg total) into feeding tube daily. 11/29/19   Samella Parr, NP  Water For Irrigation, Sterile (FREE WATER) SOLN Place 200 mLs into feeding tube every 6 (six) hours. 11/28/19   Samella Parr, NP    Allergies Patient has no known allergies.  Family History  Problem Relation Age of Onset  . Hyperlipidemia Mother   . Hypertension Mother   . Diabetes Maternal Grandmother   . Seizures Maternal Grandmother   . Thyroid disease Maternal Grandmother   . Diabetes Paternal Grandmother   . Rashes / Skin problems Son        ezcema  . Seizures Son     Social History Social History   Tobacco Use  . Smoking status: Former Research scientist (life sciences)  . Smokeless tobacco: Never Used  Vaping Use  . Vaping Use: Never used  Substance Use Topics  . Alcohol use: Yes    Alcohol/week: 0.0 standard drinks    Comment: occass  . Drug use: No    Frequency: 5.0 times per week    Types: Marijuana    Comment: stopped when she found out she was pregnant    Review of Systems  Review of Systems  Unable to perform ROS: Patient nonverbal    Per mother no fevers, cough, vomiting, diarrhea, or other sick symptoms.  ____________________________________________   PHYSICAL EXAM:  VITAL SIGNS: ED Triage Vitals  Enc Vitals Group     BP 12/03/19 1146 106/69     Pulse Rate 12/03/19 1146 63     Resp 12/03/19 1146 20     Temp 12/03/19 1146 98.4 F  (36.9 C)     Temp Source 12/03/19 1146 Oral     SpO2 12/03/19 1146 99 %     Weight 12/03/19 1147 187 lb 6.3 oz (85 kg)     Height 12/03/19 1147 5' 10"  (1.778 m)     Head Circumference --      Peak Flow --      Pain Score --      Pain Loc --      Pain Edu? --      Excl. in [place]? --    Vitals:   12/03/19 1146  BP: 106/69  Pulse: 63  Resp: 20  Temp: 98.4 F (36.9 C)  SpO2: 99%   Physical Exam Vitals and nursing note reviewed.  HENT:     Head:  Normocephalic and atraumatic.     Right Ear: External ear normal.     Left Ear: External ear normal.     Nose: Nose normal.     Mouth/Throat:     Mouth: Mucous membranes are moist.  Eyes:     Extraocular Movements: Extraocular movements intact.     Conjunctiva/sclera: Conjunctivae normal.     Pupils: Pupils are equal, round, and reactive to light.  Cardiovascular:     Rate and Rhythm: Normal rate and regular rhythm.     Pulses: Normal pulses.  Pulmonary:     Effort: Pulmonary effort is normal. No respiratory distress.  Abdominal:     General: There is no distension.  Musculoskeletal:        General: No deformity.     Cervical back: No rigidity.  Skin:    General: Skin is warm.  Neurological:     Mental Status: She is alert. Mental status is at baseline.     PEG tube appears in place. ____________________________________________   ____________________________________________  RADIOLOGY   Official radiology report(s): DG Abdomen 1 View  Result Date: 12/03/2019 CLINICAL DATA:  Peg tube placement EXAM: ABDOMEN - 1 VIEW COMPARISON:  09/29/2019 FINDINGS: Contrast was injected via replaced PEG tube. Contrast opacifies the gastric lumen. No contrast extravasation seen. Nonobstructive bowel gas pattern. Lung bases clear. IMPRESSION: Replaced gastrostomy tube is located within the stomach. Electronically Signed   By: Lavonia Dana M.D.   On: 12/03/2019 13:46     ____________________________________________   PROCEDURES  Procedure(s) performed (including Critical Care):  Procedures   ____________________________________________   INITIAL IMPRESSION / ASSESSMENT AND PLAN / ED COURSE        Patient presents accompanied by her mother with concerns that her feeding tube may become dislodged.  Patient is afebrile hemodynamically stable.  No other acute concerns at this time per mother.  On my assessment feeding tube appears in place and has some slight resistance on gentle traction.  Balloon appears functional at this time.  KUB shot with contrast through feeding tube shows PEG is in appropriate position and there is no evidence of blockage.  Counseled patient's mother on appropriate PEG tube care advised follow-up with patient's surgeon for details on how they can do this at home and to obtain replacement kit.  Discharge stable condition.  Strict precautions advised and discussed.          ____________________________________________   FINAL CLINICAL IMPRESSION(S) / ED DIAGNOSES  Final diagnoses:  Encounter for care related to feeding tube    Medications  iohexol (OMNIPAQUE) 300 MG/ML solution 50 mL (50 mLs Per Tube Contrast Given 12/03/19 1325)     ED Discharge Orders    None       Note:  This document was prepared using Dragon voice recognition software and may include unintentional dictation errors.   Lucrezia Starch, MD 12/03/19 1357

## 2019-12-08 ENCOUNTER — Ambulatory Visit: Payer: Medicaid Other | Admitting: Family

## 2019-12-08 NOTE — Progress Notes (Deleted)
Office Visit    Patient Name: Beverly [name] Date of Encounter: 12/08/2019  Primary Care Provider:  [name] Primary Cardiologist:  No primary care provider on file. Electrophysiologist:  None   Chief Complaint    Beverly [name] is a 34 y.o. female with a hx of *** presents today for ***   Past Medical History    Past Medical History:  Diagnosis Date  . Brain injury (H[facility])   . Hypertension    last pregnancy  . Vaginal Pap smear, abnormal    when she was 34yo   Past Surgical History:  Procedure Laterality Date  . IR GASTROSTOMY TUBE MOD SED  10/07/2019  . LEFT HEART CATH AND CORONARY ANGIOGRAPHY N/A 09/23/2019   Procedure: LEFT HEART CATH AND CORONARY ANGIOGRAPHY;  Surgeon: Yvonne Kendall, MD;  Location: [facility] INVASIVE CV LAB;  Service: Cardiovascular;  Laterality: N/A;    Allergies  No Known Allergies  History of Present Illness    Beverly [name] is a 34 y.o. female with a hx of gestational hypertension*** last seen ***.  She had c-section 09/09/19 and did require one unit of PRBC and FFP.   Presented to [facility] 09/23/19 with chest pain and had witnessed cardiac arrest with polymorphic ventricular tachycardia which degenerated into ventricular fibrillation. Underwent urgent cardiac cath with aneurysmal dilation of ostial/prox LAD with concern for possible dissection. She was urgently transferred to [facility] for post-arrest management.   EKGs/Labs/Other Studies Reviewed:   The following studies were reviewed today: *** LHC (09/23/19): 1. No coronary artery occlusion or critical stenosis. 2. There is aneurysmal dilation of the ostial and proximal LAD with possible ulceration or dissection of uncertain chronicity.  TIMI-3 flow is noted throughout the LAD and its branches. 3. No angiographically significant disease involving the LCx or RCA. 4. Severely reduced left ventricular systolic function with mid/apical anterior, apical, and apical  inferior hypokinesis/akinesis. 5. Severely elevated left ventricular filling pressure.   EKG:  EKG is *** ordered today.  The ekg ordered today demonstrates ***  Recent Labs: 10/15/2019: B Natriuretic Peptide 168.9 11/12/2019: ALT 35 11/23/2019: Hemoglobin 11.0; Magnesium 2.0; Platelets 616; TSH 0.694 11/28/2019: BUN 8; Creatinine, Ser 0.78; Potassium 3.2; Sodium 137  Recent Lipid Panel    Component Value Date/Time   CHOL 220 (H) 09/26/2019 0841   TRIG 115 10/04/2019 0332   HDL 48 09/26/2019 0841   CHOLHDL 4.6 09/26/2019 0841   VLDL 50 (H) 09/26/2019 0841   LDLCALC 122 (H) 09/26/2019 0841    Risk Assessment/Calculations:  {Does this patient have ATRIAL FIBRILLATION?:[phone]}  Home Medications   No outpatient medications have been marked as taking for the 12/08/19 encounter (Appointment) with Alver Sorrow, NP.   Current Facility-Administered Medications for the 12/08/19 encounter (Appointment) with Alver Sorrow, NP  Medication  . iron polysaccharides (NIFEREX) capsule 150 mg     Review of Systems   ***   ROS All other systems reviewed and are otherwise negative except as noted above.  Physical Exam    VS:  LMP  (LMP Unknown)  , BMI There is no height or weight on file to calculate BMI.  Wt Readings from Last 3 Encounters:  12/03/19 187 lb 6.3 oz (85 kg)  11/28/19 188 lb 0.8 oz (85.3 kg)  09/23/19 235 lb (106.6 kg)     GEN: Well nourished, well developed, in no acute distress. HEENT: normal. Neck: Supple, no JVD, carotid bruits, or masses. Cardiac: ***RRR, no murmurs,  rubs, or gallops. No clubbing, cyanosis, edema.  ***Radials/DP/PT 2+ and equal bilaterally.  Respiratory:  ***Respirations regular and unlabored, clear to auscultation bilaterally. GI: Soft, nontender, nondistended. MS: No deformity or atrophy. Skin: Warm and dry, no rash. Neuro:  Strength and sensation are intact. Psych: Normal affect.  Assessment & Plan    1. ***  Disposition:  Follow up {follow up:15908} with ***   Signed, Alver Sorrow, NP 12/08/2019, 8:47 AM [facility]

## 2019-12-09 DIAGNOSIS — S069XAA Unspecified intracranial injury with loss of consciousness status unknown, initial encounter: Secondary | ICD-10-CM | POA: Insufficient documentation

## 2019-12-09 DIAGNOSIS — S069X9A Unspecified intracranial injury with loss of consciousness of unspecified duration, initial encounter: Secondary | ICD-10-CM | POA: Insufficient documentation

## 2019-12-10 ENCOUNTER — Encounter: Payer: Self-pay | Admitting: Speech Pathology

## 2019-12-10 ENCOUNTER — Ambulatory Visit: Payer: Medicaid Other | Attending: Student

## 2019-12-10 ENCOUNTER — Other Ambulatory Visit: Payer: Self-pay

## 2019-12-10 ENCOUNTER — Ambulatory Visit: Payer: Medicaid Other | Admitting: Speech Pathology

## 2019-12-10 DIAGNOSIS — R41841 Cognitive communication deficit: Secondary | ICD-10-CM | POA: Diagnosis present

## 2019-12-10 DIAGNOSIS — R278 Other lack of coordination: Secondary | ICD-10-CM | POA: Diagnosis present

## 2019-12-10 DIAGNOSIS — R29818 Other symptoms and signs involving the nervous system: Secondary | ICD-10-CM | POA: Diagnosis present

## 2019-12-10 DIAGNOSIS — M6281 Muscle weakness (generalized): Secondary | ICD-10-CM | POA: Diagnosis present

## 2019-12-10 DIAGNOSIS — R2681 Unsteadiness on feet: Secondary | ICD-10-CM | POA: Diagnosis present

## 2019-12-10 DIAGNOSIS — R262 Difficulty in walking, not elsewhere classified: Secondary | ICD-10-CM

## 2019-12-10 DIAGNOSIS — R2689 Other abnormalities of gait and mobility: Secondary | ICD-10-CM | POA: Diagnosis present

## 2019-12-10 DIAGNOSIS — R41844 Frontal lobe and executive function deficit: Secondary | ICD-10-CM | POA: Diagnosis present

## 2019-12-10 DIAGNOSIS — R41840 Attention and concentration deficit: Secondary | ICD-10-CM | POA: Diagnosis present

## 2019-12-10 NOTE — Therapy (Signed)
[facility] [address] [place], [place], [address] Phone: [phone]   Fax:  [phone]  Speech Language Pathology Evaluation  Patient Details  Name: Beverly [name] MRN: [id number] Date of Birth: March 16, 1985 Referring Provider (SLP): Dr. Candelaria Stagers   Encounter Date: 12/10/2019   End of Session - 12/10/19 1508    Visit Number 1    Number of Visits 25    Date for SLP Re-Evaluation 03/03/20    Authorization Type awaiting medicaid    SLP Start Time 1334   pt arrived late   SLP Stop Time  1400    SLP Time Calculation (min) 26 min    Activity Tolerance Patient tolerated treatment well           Past Medical History:  Diagnosis Date  . Brain injury (HCC)   . Hypertension    last pregnancy  . Vaginal Pap smear, abnormal    when she was 34yo    Past Surgical History:  Procedure Laterality Date  . IR GASTROSTOMY TUBE MOD SED  10/07/2019  . LEFT HEART CATH AND CORONARY ANGIOGRAPHY N/A 09/23/2019   Procedure: LEFT HEART CATH AND CORONARY ANGIOGRAPHY;  Surgeon: Yvonne Kendall, MD;  Location: [facility] INVASIVE CV LAB;  Service: Cardiovascular;  Laterality: N/A;    There were no vitals filed for this visit.   Subjective Assessment - 12/10/19 1455    Subjective "It was a hard day - I tried for 30 minutes to get her in the car" Pt arrives 18 minutes late    Patient is accompained by: Family member   mom, [name] and dad, Thayer Ohm   Currently in Pain? No/denies              SLP Evaluation [facility] - 12/10/19 1455      SLP Visit Information   SLP Received On 12/10/19    Referring Provider (SLP) Dr. Candelaria Stagers    Onset Date 09/23/19    Medical Diagnosis Anoxic BI      Subjective   Patient/Family Stated Goal "To get her to eat and drink"      General Information   HPI Pt is a 34 y.o. female with PMH of HTN who was admitted 09/23/19 2 weeks post partum after emergent C section due to rupture of membranes with VF  arrest. Pt with enterobacter PNA and encephalopathy, trach placed 8/30 and PEG on 8/31. MRI 8/31: No evidence of anoxic brain injury. Pt changed to #4 cuffless trach on 9/15; capped on 9/21 and decannulated on 9/25. MBS 9/22, POs started - primary oral dysphagia.   Pt currently using PEG as primary nutrition and hydration and for some meds, due to behavior. D/c'd on Dys 3 diet with thin liquids    Mobility Status arrives in wheelchair - PT eval today      Prior Functional Status   Cognitive/Linguistic Baseline Within functional limits    Type of Home House     Lives With Other (Comment)   children prior, parents now   Available Support Family    Vocation Part time employment   admissions [facility]     Cognition   Overall Cognitive Status Impaired/Different from baseline    Area of Impairment Attention;Memory;Following commands;Safety/judgement;Awareness;Problem solving;Orientation    Orientation Level Person;Place    Current Attention Level Focused    Following Commands Follows multi-step commands inconsistently    Safety/Judgement Decreased awareness of safety;Decreased awareness of deficits    Awareness Intellectual  impaired intellectual   Problem Solving Requires tactile cues;Difficulty sequencing;Requires verbal cues    Attention Sustained;Selective    Sustained Attention Impaired    Sustained Attention Impairment Verbal basic;Functional basic    Selective Attention Impaired    Selective Attention Impairment Verbal basic;Functional basic    Memory Impaired    Memory Impairment Storage deficit;Retrieval deficit;Decreased recall of new information;Decreased long term memory;Decreased short term memory;Prospective memory    Awareness Impaired    Awareness Impairment Intellectual impairment    Executive Function --   severely impaired   Behaviors Restless;Impulsive;Verbal agitation;Perseveration;Poor frustration tolerance;Confabulation      Auditory Comprehension    Overall Auditory Comprehension Impaired    Yes/No Questions Impaired    Basic Biographical Questions 51-75% accurate    Basic Immediate Environment Questions 75-100% accurate    Complex Questions 25-49% accurate    Commands Impaired    One Step Basic Commands 75-100% accurate    Two Step Basic Commands 50-74% accurate    Conversation Simple    Interfering Components Attention;Working Merchandiser, retailmemory    EffectiveTechniques Extra processing time;Repetition;Slowed speech;Visual/Gestural cues      Visual Recognition/Discrimination   Discrimination Not tested      Reading Comprehension   Reading Status Impaired    Word level 76-100% accurate    Sentence Level 76-100% accurate    Paragraph Level 26-50% accurate    Functional Environmental (signs, name badge) Impaired    Interfering Components Attention;Impulsivity;Left neglect/inattention;Eye glasses not available;Working Research officer, political partymemory      Expression   Primary Mode of Expression Verbal      Verbal Expression   Overall Verbal Expression Appears within functional limits for tasks assessed      Oral Motor/Sensory Function   Overall Oral Motor/Sensory Function Appears within functional limits for tasks assessed      Motor Speech   Overall Motor Speech Impaired    Respiration Within functional limits    Phonation Other (comment)   rapid rate with poor awareness   Resonance Within functional limits    Articulation Impaired    Level of Impairment Sentence    Intelligibility Intelligibility reduced    Word 75-100% accurate    Phrase 75-100% accurate    Sentence 50-74% accurate    Conversation 50-74% accurate    Motor Planning Witnin functional limits    Motor Speech Errors Unaware;Inconsistent    Effective Techniques Pause;Slow rate                             SLP Short Term Goals - 12/10/19 1521      SLP SHORT TERM GOAL #1   Title Dutch QuintShatoya will sustain attention for 5 minutes to simple cognitive task with occasional min A  over 3 sessions    Baseline 1-2 minutes    Time 6    Period Weeks    Status New      SLP SHORT TERM GOAL #2   Title Pt will verbalize 2 safety rules for walking at home with occasional min A over 3 sessions    Baseline pt states she can walk by herself    Time 6    Period Weeks    Status New      SLP SHORT TERM GOAL #3   Title Pt will use simple external aid to follow simple schedule and prepare for appointments with usual min A from family    Baseline does not recall or prepare for appointments  Time 6    Period Weeks    Status New      SLP SHORT TERM GOAL #4   Title Pt will use visual and written word cues to wash her face with usual min verbal cues from mom over 3 sessions    Baseline Per mom, pt only touches her face 2-3 times when asked to wash her face    Time 6    Period Weeks    Status New            SLP Long Term Goals - 12/10/19 1525      SLP LONG TERM GOAL #1   Title Pt will use external aids/to do list to complete 3 simple chores a day with occasional min A over 3 sessions    Time 12    Period Weeks    Status New      SLP LONG TERM GOAL #2   Title Pt will sustain attention for 10 minutes to simple cognitive task with 4 or less re-directions over 3 sessions    Baseline sustains attention for 1-2 minutes    Time 12    Period Weeks    Status New      SLP LONG TERM GOAL #3   Title Pt will use visual cues and schedule to eat 2 meals a day and drink 24 oz of liquid with usual min A over 3 sessions    Baseline pt eating less than 1 meal a day and taking only sips (PEG is used)    Time 12    Period Weeks    Status New      SLP LONG TERM GOAL #4   Title Pt will use reduced rate to be intellgible in 5 minute conversation with occasional min A    Baseline Rapid rate, rushes of speech, 35% of utterances unintelligible    Time 12    Period Weeks    Status New            Plan - 12/10/19 1509    Clinical Impression Statement Kayron Kalmar is referred  for outpt ST after cardiace arrest resulted in anoxic brain injury. She was hopsitalized 09/23/19 to 11/27/19. She is accompanied by her mom and dad. In the lobby, pt refusing to go back with clinician. Required consistent redirectioning and encouragement, perseveration "I'm not supposed to be here." Parents report inconsistent eating, depending on "her mood." She is eating approximately 1 meal a day inconsistently and not drinking enough. Parents are using PEG for primary nutrition and hydration, as well as meds. Mom reports Alletta pulls at her PEG tube as she forgets she has a feeding tube. She inconsistently follows commands for grooming per mom. Today, Randilyn presents with severe cognitive linguistic impairments in attention, awareness, memory. Mild dysarthria affecting intelligiblity, which mom endorses at home. Marylon exhibited confabulations throughout evaluation. She recalled 1/3 children, and responded she was "26" years old despsite parents' cues. Corinthia stated an address she lived at a while ago. She sustains attention for approximately 1-2 minutes.She is oriented to year, month, day but not date. Mom reports saftey concerns as Kathrene attempts to stand on her own at home. When asked, she replied "I can stand up by myself." Adylin did follow 1 and 2 step written commands and read 3 5 word sentences with cues. Parents report that Shaleah stays up late pacing, and then sleeps late. I recommend skilled ST to maximize cogntion and intelligibility for safety, to reduce caregiver burden.  Speech Therapy Frequency 2x / week    Duration 12 weeks   or 25 visits   Treatment/Interventions Language facilitation;Environmental controls;Cueing hierarchy;SLP instruction and feedback;Aspiration precaution training;Compensatory techniques;Cognitive reorganization;Functional tasks;Compensatory strategies;Diet toleration management by SLP;Trials of upgraded texture/liquids;Internal/external aids;Multimodal  communcation approach;Patient/family education    Potential to Achieve Goals Fair    Potential Considerations Severity of impairments;Financial resources           Patient will benefit from skilled therapeutic intervention in order to improve the following deficits and impairments:   Cognitive communication deficit    Problem List Patient Active Problem List   Diagnosis Date Noted  . Brain injury (HCC)   . Prediabetes   . Anoxic brain injury (HCC) 11/05/2019  . Dysphagia 11/05/2019  . Physical deconditioning 11/05/2019  . Acute delirium 11/05/2019  . Respiratory failure (HCC)   . Acute metabolic encephalopathy   . Encounter for central line placement   . Cardiac arrest (HCC) 09/23/2019  . Acute combined systolic and diastolic heart failure (HCC) 09/23/2019  . Ventricular tachycardia, polymorphic (HCC) 09/23/2019  . Pelvic pain affecting pregnancy 10/15/2015  . Supervision of normal pregnancy in third trimester 09/28/2015  . Poor weight gain of pregnancy 09/28/2015  . Iron deficiency anemia of pregnancy 09/01/2015  . Increased BMI (body mass index) 07/29/2015    Dontray Haberland, Radene Journey MS, CCC-SLP 12/10/2019, 3:36 PM  [facility] [address] [place], [place], [address] Phone: [phone]   Fax:  [phone]  Name: MACALA BALDONADO MRN: [id number] Date of Birth: 1985/08/06

## 2019-12-10 NOTE — Therapy (Signed)
[facility] [address] [place], [place], [address] Phone: [phone]   Fax:  [phone]  Physical Therapy Evaluation  Patient Details  Name: Beverly [name] MRN: [id number] Date of Birth: 12-05-1985 Referring Provider (PT): Almon Hercules, MD   Encounter Date: 12/10/2019   PT End of Session - 12/10/19 1501    Visit Number 1    Number of Visits 17    Date for PT Re-Evaluation 03/09/20   POC for 8 weeks, Cert for 90 days   Authorization Type Medicaid OON; Self Pay    PT Start Time 1402    PT Stop Time 1445    PT Time Calculation (min) 43 min    Equipment Utilized During Treatment Gait belt    Activity Tolerance Patient tolerated treatment well    Behavior During Therapy Impulsive           Past Medical History:  Diagnosis Date  . Brain injury (HCC)   . Hypertension    last pregnancy  . Vaginal Pap smear, abnormal    when she was 34yo    Past Surgical History:  Procedure Laterality Date  . IR GASTROSTOMY TUBE MOD SED  10/07/2019  . LEFT HEART CATH AND CORONARY ANGIOGRAPHY N/A 09/23/2019   Procedure: LEFT HEART CATH AND CORONARY ANGIOGRAPHY;  Surgeon: Yvonne Kendall, MD;  Location: [facility] INVASIVE CV LAB;  Service: Cardiovascular;  Laterality: N/A;    There were no vitals filed for this visit.    Subjective Assessment - 12/10/19 1408    Subjective On 09/23/19 patient presented to ED 2 weeks postpartum (had emergent C-Section) with chest pain. In ED sustained VFib Cardiac Arrest requiring intubation. EKG showed ST elevation, Cath Lab showed aneurysmal dissection of the LAD was identified. Course complicated by encephalopathy, anoxic brain injury, enterobacterial PNA. Patient is currently living with mom. Mom reports was unable to recieve [facility] services. Mom reports that she has a platform walker at home but is not using it current per advice from therapists at [facility]. No falls to report. Patient reports no pain  currently but does have some low back pain at times.    Patient is accompained by: Family member   Mom/Dad   Pertinent History HTN    Limitations Sitting;Standing;Walking;House hold activities    Patient Stated Goals walk better    Currently in Pain? No/denies              [facility] PT Assessment - 12/10/19 0001      Assessment   Medical Diagnosis Anoxic Brain Injury    Referring Provider (PT) Almon Hercules, MD    Onset Date/Surgical Date 09/23/19    Hand Dominance Left    Next MD Visit Mom trying to set up appt with [name]     Prior Therapy [facility]      Precautions   Precautions Fall    Precaution Comments Dysphagia Diet, PEG for meds/supplements    Required Braces or Orthoses Other Brace/Splint    Other Brace/Splint OT prescribed UE splints to be worn while sleeping, mom reports not wearing frequently      Balance Screen   Has the patient fallen in the past 6 months Yes    How many times? 2    Has the patient had a decrease in activity level because of a fear of falling?  No    Is the patient reluctant to leave their home because of a fear of falling?  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   Overall Cognitive Status Impaired/Different from baseline    Orientation Level Person;Place      Sensation   Light Touch --   tested but difficult to determine   Additional Comments inconsistent responses with sensation testing; difficult to determine if intact/impaired      Coordination   Gross Motor  Movements are Fluid and Coordinated No    Fine Motor Movements are Fluid and Coordinated No      Tone   Assessment Location Right Lower Extremity;Left Lower Extremity      ROM / Strength   AROM / PROM / Strength AROM;Strength      AROM   Overall AROM  Deficits    Overall AROM Comments due to strength deficits; no formal measurements taken      Strength   Overall Strength Deficits    Overall Strength Comments decreased strength in BLE hip 3+/5 grossly, B DF grossly 3/5      Bed Mobility   Bed Mobility Rolling Right;Rolling Left;Supine to Sit;Sit to Supine    Rolling Right Supervision/verbal cueing    Rolling Left Supervision/Verbal cueing    Supine to Sit Contact Guard/Touching assist    Sit to Supine Contact Guard/Touching assist      Transfers   Transfers Sit to Stand;Stand to Sit;Stand Pivot Transfers    Sit to Stand 4: Min guard;4: Min assist    Sit to Stand Details Tactile cues for weight shifting;Tactile cues for sequencing;Verbal cues for sequencing;Verbal cues for technique;Verbal cues for precautions/safety    Sit to Stand Details (indicate cue type and reason) with completion of sit <> stand increased impulsivity noted with patient often LOB upon standing requiring Min A from PT to stabilize. Verbal cues for slowed pace with improved completion noted.     Stand to Sit 4: Min guard;4: Min assist    Stand to Sit Details (indicate cue type and reason) Tactile cues for sequencing;Verbal cues for sequencing;Verbal cues for technique;Verbal cues for precautions/safety    Stand Pivot Transfers 4: Min guard;4: Min assist    Stand Pivot Transfer Details (indicate cue type and reason) completed transfer from manual w/c <> mat with intermittent Min A - Min Guard. Min A required to maintain balance. verbal cues for proper turn with Hydrographic surveyor Yes    Wheelchair Assistance Details (indicate cue type and reason) Patient require cues for  lock/unlco brakes. but able to propel w/c with BUE    Distance 20      Balance   Balance Assessed Yes      Static Sitting Balance   Static Sitting - Balance Support No upper extremity supported    Static Sitting - Level of Assistance 5: Stand by assistance    Static Sitting - Comment/# of Minutes 2 minutes, patient typically wanting to use  UE support to stabilize      Static Standing Balance   Static Standing - Balance Support No upper extremity supported    Static Standing - Level of Assistance 5: Stand by assistance;4: Min assist    Static Standing - Comment/# of Minutes able to stand for <30 seconds without assistance      RLE Tone   RLE Tone Moderate;Modified Ashworth      RLE Tone   Modified Ashworth Scale for Grading Hypertonia RLE More marked increase in muscle tone through most of the ROM, but affected part(s) easily moved      LLE Tone   LLE Tone Moderate;Modified Ashworth      LLE Tone   Modified Ashworth Scale for Grading Hypertonia LLE More marked increase in muscle tone through most of the ROM, but affected part(s) easily moved                      Objective measurements completed on examination: See above findings.               PT Education - 12/10/19 1412    Education Details Educated on [place]; Potential PT/OT/ST Services at [facility]) Educated Patient;Parent(s)    Methods Explanation    Comprehension Verbalized understanding            PT Short Term Goals - 12/10/19 1520      PT SHORT TERM GOAL #1   Title Patient/caregiver will report independence with HEP with caregiver assistance (All STGs Due: 01/07/2020)    Baseline no HEP established    Time 4    Period Weeks    Status New    Target Date 01/07/20      PT SHORT TERM GOAL #2   Title Patient will demo ability to complete transfer from w/c <> mat with CGA with LRAD to demonstrate improved functional mobility    Baseline CGA - Min A    Time 4     Period Weeks    Status New    Target Date 01/07/20      PT SHORT TERM GOAL #3   Title Patient will demo ability to ambulate >/= 20 ft  with LRAD to demonstrate improved functional mobility    Baseline TBA    Time 4    Period Weeks    Status New             PT Long Term Goals - 12/10/19 1522      PT LONG TERM GOAL #1   Title Patient/caregiver will report indepence with final HEP with caregiver assistance (All LTGS: 02/04/2020)    Baseline no HEP established    Time 8    Period Weeks    Status New    Target Date 02/04/20      PT LONG TERM GOAL #2   Title Patient will demo ability to complete all bed mobility Mod I to demonstrate improved independence    Baseline CGA/Supervision    Time 8    Period Weeks    Status New      PT LONG TERM GOAL #3   Title Patient will demo ability to complete sit <> stand x 5 reps with supervision to demonstrate improved balance and functional mobility    Baseline CGA - Min A    Time 8    Period Weeks    Status New      PT LONG TERM GOAL #4   Title  Patient will demo ability to ambulate > 100 ft w/ LRAD to demonstrate improved household mobility    Baseline TBA    Time 8    Period Weeks    Status New                  Plan - 12/10/19 1514    Clinical Impression Statement Patient is a 34 y.o. female that was referred to Neuro OPPT services for Anoxic Brain Injury. Patient sustained V Fib cardiac arrest two weeks postpartum requiring intubation. Patient had extensive medical course complicated by encephalopathy, anoxic brain injury, enterobacterial PNA. Patient currently is on Dysphagia Diet and using PEG Tube for meds/supplements. Patient PMH included: HTN. Patient presents with the following impairments upon evaluation: impaired sensation, impaired tone, decreased sitting/standing balance, impaired functional mobility, abnormal gait, decreased strength, and high fall risk due to impulsivity. Patient currently requiring CGA - Min A  with sit <> stands and transfers due to decreased balance and impulsivity. Did not assess ambulation today, but mom reporting that she is ambulating at home with physical assistance from family member. PT educating on potential transfer of care to [facility] neuro rehab due to commute at this time, with mom verbalizing agreement. Will continue to receive PT/ST/OT services at this clinic until transfer. Patient will benefit from skilled PT services to improve functional mobility and reduce fall risk.    Personal Factors and Comorbidities Comorbidity 1;Behavior Pattern;Transportation    Comorbidities HTN    Examination-Activity Limitations Bed Mobility;Dressing;Hygiene/Grooming;Caring for Others;Stairs;Stand;Toileting;Transfers;Sit;Bathing    Examination-Participation Restrictions Occupation;Community Activity;Medication Management;Cleaning    Stability/Clinical Decision Making Evolving/Moderate complexity    Clinical Decision Making Moderate    Rehab Potential Good    PT Frequency 2x / week    PT Duration 8 weeks    PT Treatment/Interventions ADLs/Self Care Home Management;Aquatic Therapy;Electrical Stimulation;DME Instruction;Gait training;Stair training;Functional mobility training;Therapeutic activities;Therapeutic exercise;Balance training;Neuromuscular re-education;Wheelchair mobility training;Patient/family education;Orthotic Fit/Training;Manual techniques;Passive range of motion    PT Next Visit Plan Assess Gait Further; Initiate seated/supine strengthening HEP    Recommended Other Services Occupational Therapy, Speech Therapy    Consulted and Agree with Plan of Care Patient;Family member/caregiver    Family Member Consulted Mom/Dad           Patient will benefit from skilled therapeutic intervention in order to improve the following deficits and impairments:  Abnormal gait, Decreased balance, Decreased endurance, Decreased mobility, Difficulty walking, Impaired tone, Impaired sensation, Pain,  Impaired UE functional use, Decreased strength, Decreased safety awareness, Decreased knowledge of use of DME, Decreased coordination, Decreased activity tolerance, Decreased cognition, Decreased range of motion  Visit Diagnosis: Difficulty in walking, not elsewhere classified  Muscle weakness (generalized)  Other abnormalities of gait and mobility  Other lack of coordination  Unsteadiness on feet     Problem List Patient Active Problem List   Diagnosis Date Noted  . Brain injury (HCC)   . Prediabetes   . Anoxic brain injury (HCC) 11/05/2019  . Dysphagia 11/05/2019  . Physical deconditioning 11/05/2019  . Acute delirium 11/05/2019  . Respiratory failure (HCC)   . Acute metabolic encephalopathy   . Encounter for central line placement   . Cardiac arrest (HCC) 09/23/2019  . Acute combined systolic and diastolic heart failure (HCC) 09/23/2019  . Ventricular tachycardia, polymorphic (HCC) 09/23/2019  . Pelvic pain affecting pregnancy 10/15/2015  . Supervision of normal pregnancy in third trimester 09/28/2015  . Poor weight gain of pregnancy 09/28/2015  . Iron deficiency anemia of pregnancy 09/01/2015  . Increased BMI (  body mass index) 07/29/2015    Tempie Donning, PT, DPT 12/10/2019, 3:24 PM  [facility] [address] [place], [place], [address] Phone: [phone]   Fax:  [phone]  Name: LAKEYSHIA TUCKERMAN MRN: [id number] Date of Birth: [birth year]

## 2019-12-11 ENCOUNTER — Encounter: Payer: Self-pay | Admitting: Internal Medicine

## 2019-12-11 ENCOUNTER — Ambulatory Visit (INDEPENDENT_AMBULATORY_CARE_PROVIDER_SITE_OTHER): Payer: Medicaid Other | Admitting: Internal Medicine

## 2019-12-11 VITALS — BP 106/70 | HR 64 | Ht 68.0 in | Wt 189.0 lb

## 2019-12-11 DIAGNOSIS — I4901 Ventricular fibrillation: Secondary | ICD-10-CM | POA: Diagnosis not present

## 2019-12-11 DIAGNOSIS — I2510 Atherosclerotic heart disease of native coronary artery without angina pectoris: Secondary | ICD-10-CM | POA: Diagnosis not present

## 2019-12-11 DIAGNOSIS — I5041 Acute combined systolic (congestive) and diastolic (congestive) heart failure: Secondary | ICD-10-CM | POA: Diagnosis not present

## 2019-12-11 DIAGNOSIS — G931 Anoxic brain damage, not elsewhere classified: Secondary | ICD-10-CM

## 2019-12-11 DIAGNOSIS — I5022 Chronic systolic (congestive) heart failure: Secondary | ICD-10-CM | POA: Diagnosis not present

## 2019-12-11 DIAGNOSIS — I255 Ischemic cardiomyopathy: Secondary | ICD-10-CM | POA: Diagnosis not present

## 2019-12-11 DIAGNOSIS — I429 Cardiomyopathy, unspecified: Secondary | ICD-10-CM

## 2019-12-11 NOTE — Patient Instructions (Signed)
Medication Instructions:  Your physician recommends that you continue on your current medications as directed. Please refer to the Current Medication list given to you today.  *If you need a refill on your cardiac medications before your next appointment, please call your pharmacy*   Lab Work:  Your physician recommends that you return for lab work today: Bmet and Digoxin level  If you have labs (blood work) drawn today and your tests are completely normal, you will receive your results only by: Marland Kitchen MyChart Message (if you have MyChart) OR . A paper copy in the mail If you have any lab test that is abnormal or we need to change your treatment, we will call you to review the results.   Testing/Procedures:  Your physician has requested that you have a limited echocardiogram. Echocardiography is a painless test that uses sound waves to create images of your heart. It provides your doctor with information about the size and shape of your heart and how well your heart's chambers and valves are working. This procedure takes approximately one hour. There are no restrictions for this procedure.     Follow-Up: At [facility], you and your health needs are our priority.  As part of our continuing mission to provide you with exceptional heart care, we have created designated Provider Care Teams.  These Care Teams include your primary Cardiologist (physician) and Advanced Practice Providers (APPs -  Physician Assistants and Nurse Practitioners) who all work together to provide you with the care you need, when you need it.  We recommend signing up for the patient portal called "MyChart".  Sign up information is provided on this After Visit Summary.  MyChart is used to connect with patients for Virtual Visits (Telemedicine).  Patients are able to view lab/test results, encounter notes, upcoming appointments, etc.  Non-urgent messages can be sent to your provider as well.   To learn more about what you  can do with MyChart, go to [email or web address].    Your next appointment:   1 month(s)  The format for your next appointment:   In Person  Provider:   You may see Dr. Cristal Deer End or one of the following Advanced Practice Providers on your designated Care Team:    Nicolasa Ducking, NP  Eula Listen, PA-C  Marisue Ivan, PA-C  Cadence Fransico Michael, [place]    Other Instructions You have been referred to [facility]. Someone should call you to scheduled an appointment, however, if they do not, please call the number listed on the next page next week.

## 2019-12-11 NOTE — Progress Notes (Signed)
Follow-up Outpatient Visit Date: 12/11/2019  Primary Care Provider: [name] [address] [place] [address]  Chief Complaint: Follow-up cardiac arrest and STEMI  HPI:  Ms. Adkison is a 34 y.o. 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  She initially presented to the [facility] emergency department on 09/24/2019 complaining of chest pain.  She was approximately 2 weeks postpartum.  She had witnessed cardiac arrest in the emergency department with ventricular tachycardia degenerating to ventricular fibrillation.  Ultimately, ROSC was achieved after several shocks and rounds of epinephrine.  Initial post resuscitation EKG showed significant anterior ST segment elevation that resolved quickly.  She was taken for urgent cardiac catheterization that revealed aneurysmal dilation of the ostial/proximal LAD with apparent area of dissection within the aneurysmal segment.  She was transferred to [facility] for ongoing care, including targeted temperature management.  She had a prolonged course on the ventilator and ultimately underwent tracheostomy and gastrostomy tube placement.  She was discharged from [facility] on 11/28/2019.  Today, Ms. Kemmer is accompanied by her parents, who provide most of the history.  Since returning home about 2 weeks ago, she continues to have issues with memory, speech, and agitation.  She denies chest pain and shortness of breath.  She has not had any significant swelling.  Oral intake is not very good.  She continues to receive supplemental nutrition through her G-tube.  Physical therapy and speech therapy will hopefully begin working with her at home in the next week or two.  Ms.  Smither mother inquires about neurology follow-up, which does not appear to be scheduled at this time.  She is also in the process of trying to facilitate PCP follow-up.  --------------------------------------------------------------------------------------------------  Past Medical History:  Diagnosis Date  . Brain injury (HCC)   . Hypertension    last pregnancy  . Ischemic cardiomyopathy   . STEMI (ST elevation myocardial infarction) (HCC) 09/2019   SCAD with aneurysmal dilation of proximal LAD.  . Vaginal Pap smear, abnormal    when she was 34yo  . Ventricular fibrillation (HCC) 09/23/2019    Recent CV Pertinent Labs: Lab Results  Component Value Date   CHOL 220 (H) 09/26/2019   HDL 48 09/26/2019   LDLCALC 122 (H) 09/26/2019   TRIG 115 10/04/2019   CHOLHDL 4.6 09/26/2019   INR 1.2 09/24/2019   BNP 168.9 (H) 10/15/2019   K 3.9 12/11/2019   MG 2.0 11/23/2019   BUN 7 12/11/2019   CREATININE 0.53 (L) 12/11/2019    Past medical and surgical history were reviewed and updated in EPIC.  Current Meds  Medication Sig  . acetaminophen (TYLENOL) 160 MG/5ML solution Place 20.3 mLs (650 mg total) into feeding tube every 4 (four) hours as needed for moderate pain, headache or fever.  Marland Kitchen aspirin 81 MG chewable tablet Place 1 tablet (81 mg total) into feeding tube daily.  Marland Kitchen atorvastatin (LIPITOR) 80 MG tablet Place 1 tablet (80 mg total) into feeding tube daily.  . carvedilol (COREG) 6.25 MG tablet Place 1 tablet (6.25 mg total) into feeding tube 2 (two) times daily with a meal.  . digoxin (LANOXIN) 0.125 MG tablet Place 1 tablet (0.125 mg total) into feeding tube daily.  . feeding supplement (ENSURE ENLIVE / ENSURE PLUS) LIQD  Take 237 mLs by mouth 2 (two) times daily between meals.  . melatonin 3 MG TABS tablet Place 1 tablet (3 mg total) into feeding tube at bedtime as needed (insomia/nocturnal agitation).  . OXcarbazepine (TRILEPTAL) 150 MG tablet Place 1 tablet (150 mg total) into  feeding tube at bedtime.  . OXcarbazepine (TRILEPTAL) 150 MG tablet Place 0.5 tablets (75 mg total) into feeding tube every morning.  . pantoprazole (PROTONIX) 40 MG tablet Take 1 tablet (40 mg total) by mouth daily.  . Prenatal Vit-Fe Fumarate-FA (PRENATAL MULTIVITAMIN) TABS tablet Place 1 tablet into feeding tube daily at 12 noon.  . sacubitril-valsartan (ENTRESTO) 24-26 MG Take 1 tablet by mouth 2 (two) times daily.  Marland Kitchen spironolactone (ALDACTONE) 25 MG tablet Place 1 tablet (25 mg total) into feeding tube daily.  . Water For Irrigation, Sterile (FREE WATER) SOLN Place 200 mLs into feeding tube every 6 (six) hours.   Current Facility-Administered Medications for the 12/11/19 encounter (Office Visit) with Deaundre Allston, Cristal Deer, MD  Medication  . iron polysaccharides (NIFEREX) capsule 150 mg    Allergies: Patient has no known allergies.  Social History   Tobacco Use  . Smoking status: Former Games developer  . Smokeless tobacco: Never Used  Vaping Use  . Vaping Use: Never used  Substance Use Topics  . Alcohol use: Yes    Alcohol/week: 0.0 standard drinks    Comment: occass  . Drug use: No    Frequency: 5.0 times per week    Types: Marijuana    Comment: stopped when she found out she was pregnant    Family History  Problem Relation Age of Onset  . Hyperlipidemia Mother   . Hypertension Mother   . Diabetes Maternal Grandmother   . Seizures Maternal Grandmother   . Thyroid disease Maternal Grandmother   . Diabetes Paternal Grandmother   . Rashes / Skin problems Son        ezcema  . Seizures Son     Review of Systems: A 12-system review of systems was performed and was negative except as noted in the HPI.  --------------------------------------------------------------------------------------------------  Physical Exam: BP 106/70 (BP Location: Right Arm, Patient Position: Sitting, Cuff Size: Normal)   Pulse 64   Ht 5\' 8"  (1.727 m)   Wt 189 lb (85.7 kg)   LMP  (LMP Unknown)   SpO2  98%   BMI 28.74 kg/m   General: NAD.  Seated in a wheelchair and accompanied by her parents. HEENT: No conjunctival pallor or scleral icterus. Facemask in place. Neck: No JVD or HJR. Lungs: Normal work of breathing. Clear to auscultation bilaterally without wheezes or crackles. Heart: Regular rate and rhythm without murmurs, rubs, or gallops. Abd: Bowel sounds present. Soft, NT/ND. Ext: No lower extremity edema. Radial, PT, and DP pulses are 2+ bilaterally.  EKG: Normal sinus rhythm with rightward axis, low voltage, and poor R wave progression.  Lab Results  Component Value Date   WBC 7.4 11/23/2019   HGB 11.0 (L) 11/23/2019   HCT 35.7 (L) 11/23/2019   MCV 82.1 11/23/2019   PLT 616 (H) 11/23/2019    Lab Results  Component Value Date   NA 140 12/11/2019   K 3.9 12/11/2019   CL 104 12/11/2019   CO2 22 12/11/2019   BUN 7 12/11/2019   CREATININE 0.53 (L) 12/11/2019   GLUCOSE 104 (H) 12/11/2019   ALT 35 11/12/2019    Lab Results  Component Value Date   CHOL 220 (H) 09/26/2019   HDL 48 09/26/2019  LDLCALC 122 (H) 09/26/2019   TRIG 115 10/04/2019   CHOLHDL 4.6 09/26/2019    --------------------------------------------------------------------------------------------------  ASSESSMENT AND PLAN: Coronary artery disease status post STEMI: It is difficult to gauge Ms. Kirchman's symptoms, but there has been no obvious recurrence of angina.  EKG today shows evidence of prior anterior infarct.  Catheterization at the time of her VF arrest in August showed aneurysmal changes to her proximal LAD and apparent spontaneous dissection in the ostial portion of the LAD.  Revascularization was not pursued at that time.  I have reviewed her angiograms again today and do not see any reasonable targets for percutaneous intervention.  I think medical therapy would be her best long-term option.  We will plan to continue with aspirin and statin therapy.  At some point, we may need to consider  further screening for fibromuscular dysplasia, given its association with SCAD.  I would like to allow her to recover as much as possible from her anoxic brain injury before pursuing elective testing in order to minimize stress to Ms. Nofziger and improve compliance with the examinations.  Chronic HFrEF due to ischemic cardiomyopathy: LVEF was found to be 30-35% following STEMI.  Ms. Bermingham appears euvolemic and seems to be tolerating her current regimen of carvedilol, Entresto, digoxin, and spironolactone well.  Her borderline low heart rate and blood pressure today precludes escalation.  I will check a basic metabolic panel and digoxin level today.  We will plan to repeat a limited echocardiogram to reassess her systolic function.  Ventricular fibrillation: This occurred in the setting of chest pain with immediate post ROSC EKG demonstrating anterior ST elevation.  I suspect this was likely precipitated by acute ischemia in the setting of ostial LAD SCAD.  Given that her CAD was not revascularized and her LVEF severely reduced, she is at increased risk for recurrent ventricular arrhythmias.  Given her anoxic brain injury, she is not a good candidate for LifeVest therapy.  I discussed potential for EP referral to discuss ICD placement for secondary prevention with her parents.  They would like to pursue this, particularly if her LVEF remains reduced.  We will readdress this after repeating a limited echo.  For now, we will continue carvedilol 6.25 mg twice daily.  Anoxic brain injury: Though VF occurred in the emergency department with prompt resuscitation, Ms. Kilbourne continues to have significant neurologic deficits.  I will refer her to neurology in [facility] for ongoing management.  Follow-up: Return to clinic in 1 month.  Yvonne Kendall, MD 12/12/2019 7:52 AM

## 2019-12-12 ENCOUNTER — Encounter: Payer: Self-pay | Admitting: Internal Medicine

## 2019-12-12 DIAGNOSIS — I2510 Atherosclerotic heart disease of native coronary artery without angina pectoris: Secondary | ICD-10-CM | POA: Insufficient documentation

## 2019-12-12 DIAGNOSIS — I255 Ischemic cardiomyopathy: Secondary | ICD-10-CM | POA: Insufficient documentation

## 2019-12-12 LAB — BASIC METABOLIC PANEL
BUN/Creatinine Ratio: 13 (ref 9–23)
BUN: 7 mg/dL (ref 6–20)
CO2: 22 mmol/L (ref 20–29)
Calcium: 9.6 mg/dL (ref 8.7–10.2)
Chloride: 104 mmol/L (ref 96–106)
Creatinine, Ser: 0.53 mg/dL — ABNORMAL LOW (ref 0.57–1.00)
GFR calc Af Amer: 145 mL/min/{1.73_m2} (ref >59)
GFR calc non Af Amer: 126 mL/min/{1.73_m2} (ref >59)
Glucose: 104 mg/dL — ABNORMAL HIGH (ref 65–99)
Potassium: 3.9 mmol/L (ref 3.5–5.2)
Sodium: 140 mmol/L (ref 134–144)

## 2019-12-12 LAB — DIGOXIN LEVEL: Digoxin, Serum: 0.6 ng/mL (ref 0.5–0.9)

## 2019-12-17 ENCOUNTER — Ambulatory Visit: Payer: Medicaid Other

## 2019-12-17 ENCOUNTER — Other Ambulatory Visit: Payer: Self-pay

## 2019-12-17 DIAGNOSIS — R2681 Unsteadiness on feet: Secondary | ICD-10-CM

## 2019-12-17 DIAGNOSIS — R262 Difficulty in walking, not elsewhere classified: Secondary | ICD-10-CM | POA: Diagnosis not present

## 2019-12-17 DIAGNOSIS — R278 Other lack of coordination: Secondary | ICD-10-CM

## 2019-12-17 DIAGNOSIS — R41841 Cognitive communication deficit: Secondary | ICD-10-CM

## 2019-12-17 DIAGNOSIS — M6281 Muscle weakness (generalized): Secondary | ICD-10-CM

## 2019-12-17 DIAGNOSIS — R2689 Other abnormalities of gait and mobility: Secondary | ICD-10-CM

## 2019-12-17 NOTE — Therapy (Signed)
[facility] [address] [place], [place], [address] Phone: [phone]   Fax:  [phone]  Physical Therapy Treatment  Patient Details  Name: Beverly [name] MRN: [id number] Date of Birth: August 30, 1985 Referring Provider (PT): Almon Hercules, MD   Encounter Date: 12/17/2019   PT End of Session - 12/17/19 1810    Visit Number 2    Number of Visits 17    Date for PT Re-Evaluation 03/09/20   POC for 8 weeks, Cert for 90 days   Authorization Type Medicaid OON; Self Pay    PT Start Time 0248    PT Stop Time 0328    PT Time Calculation (min) 40 min    Equipment Utilized During Treatment Gait belt    Activity Tolerance Patient tolerated treatment well    Behavior During Therapy Impulsive           Past Medical History:  Diagnosis Date   Brain injury (HCC)    Hypertension    last pregnancy   Ischemic cardiomyopathy    STEMI (ST elevation myocardial infarction) (HCC) 09/2019   SCAD with aneurysmal dilation of proximal LAD.   Vaginal Pap smear, abnormal    when she was 34yo   Ventricular fibrillation (HCC) 09/23/2019    Past Surgical History:  Procedure Laterality Date   IR GASTROSTOMY TUBE MOD SED  10/07/2019   LEFT HEART CATH AND CORONARY ANGIOGRAPHY N/A 09/23/2019   Procedure: LEFT HEART CATH AND CORONARY ANGIOGRAPHY;  Surgeon: Yvonne Kendall, MD;  Location: [facility] INVASIVE CV LAB;  Service: Cardiovascular;  Laterality: N/A;    There were no vitals filed for this visit.   Subjective Assessment - 12/17/19 1449    Subjective Patient has had 1 fall in bedroom when trying to get up without mother. Pt reports no pain. Lives with mother and gets around using HHA from mother. They have not attempted FWW yet per PT at [facility]. Mother states that she is very overwhelmed and burnt out taking care of pt and pt's 3 children.    Patient is accompained by: Family member   Mom/Dad   Pertinent History HTN     Limitations Sitting;Standing;Walking;House hold activities    Patient Stated Goals walk better    Currently in Pain? No/denies                             [facility] Adult PT Treatment/Exercise - 12/17/19 1527      Transfers   Transfers Sit to Stand;Stand to [employer] Transfers    Sit to Stand 4: Min assist    Sit to Stand Details Tactile cues for weight shifting;Tactile cues for sequencing;Verbal cues for sequencing;Verbal cues for technique;Verbal cues for precautions/safety    Sit to Stand Details (indicate cue type and reason) Pt used back of legs against mat for increased stability. PT cued pt ot move toward edge to assess true STS ability. Pt required use of hands and min A with proper mechanics.    Stand to Sit 4: Min guard;4: Min assist    Stand to Sit Details (indicate cue type and reason) Tactile cues for sequencing;Verbal cues for sequencing;Verbal cues for technique;Verbal cues for precautions/safety    Stand Pivot Transfers 4: Min guard;4: Min assist    Stand Pivot Transfer Details (indicate cue type and reason) completed transfer from manual w/c <> mat with intermittent Min A - Min Guard. Min A required to maintain  balance. verbal cues for proper turn with completion      Ambulation/Gait   Ambulation/Gait Yes    Ambulation/Gait Assistance 2: Max assist    Ambulation/Gait Assistance Details Pt required Max+2 using HHA d/t ataxic, jerky, uncoordinated gait. PT attempted first lap with FWW and pt was very unsafe with AD picking it up intermittentely and veering severely.     Ambulation Distance (Feet) 345 Feet    Assistive device 2 person hand held assist;Rolling walker    Gait Pattern Ataxic;Wide base of support;Step-through pattern    Ambulation Surface Level;Indoor      Self-Care   Self-Care Other Self-Care Comments    Other Self-Care Comments  PT looked at PEG tube site as mother concerned as no one has been checking it. She is also concerned that  Kandee may have pulled out some. Site shows no signs of infection. PT unable to tell if tube has been pulled out some. Mother has been keeping an abdominal binder over the site to prevent pt from pulling at it. Mom reports no follow-ups with GI at this time. Seeing new PCP tomorrow and advised to discuss with them who will be handling feeding tube.      Therapeutic Activites    Therapeutic Activities Other Therapeutic Activities    Other Therapeutic Activities Pt instructed in repeated STS training without AD using UE support on mat and min A. x10 reps with fair control and moderate impulsivity.      Neuro Re-ed    Neuro Re-ed Details  Pt instructed in ball tossing/catching at edge of mat working on increasing sitting balance with other PT guarding x25 reps requiring min-max A, max A when pt did not catch ball and reached down to ground to retrieve it. Pt also instructed in moving purple ball from waist to above head sitting edge of mat x12 reps in order to promote core strengthening and improved sitting balance with PT guiding pt to ensure proper mechanics and pacing.  Pt also challenged in standing balance fro 2 min with no AD and 1 step in front of mat table to prevent pt from using legs against mat for stability. Pt able top hold >1 min with CGA using legs to stabilize, and needed min A when cued to step forward away from mat.      Exercises   Exercises Other Exercises    Other Exercises  SLR x10 bil in supine with PT providing visual target for proper ROM and VCs for pacing.                   PT Education - 12/17/19 1719    Education Details Educated pt on continued use of HHA for household ambulation as pt is not safe to ambulate with FWW.    Person(s) Educated Patient;Parent(s);Other (comment)   Mother and friend   Methods Explanation    Comprehension Verbalized understanding            PT Short Term Goals - 12/18/19 0839      PT SHORT TERM GOAL #1   Title  Patient/caregiver will report independence with HEP with caregiver assistance (All STGs Due: 01/07/2020)    Baseline no HEP established    Time 4    Period Weeks    Status New    Target Date 01/07/20      PT SHORT TERM GOAL #2   Title Patient will demo ability to complete transfer from w/c <> mat with CGA with LRAD to  demonstrate improved functional mobility    Baseline CGA - Min A    Time 4    Period Weeks    Status New    Target Date 01/07/20      PT SHORT TERM GOAL #3   Title Patient will demo ability to ambulate >/= 200 ft  with LRAD or hand hand assist with moderate assistance to demonstrate improved functional mobility in home.    Baseline currently max assist +2 hand held assist.    Time 4    Period Weeks    Status Revised             PT Long Term Goals - 12/18/19 0840      PT LONG TERM GOAL #1   Title Patient/caregiver will report indepence with final HEP with caregiver assistance (All LTGS: 02/04/2020)    Baseline no HEP established    Time 8    Period Weeks    Status New      PT LONG TERM GOAL #2   Title Patient will demo ability to complete all bed mobility Mod I to demonstrate improved independence    Baseline CGA/Supervision    Time 8    Period Weeks    Status New      PT LONG TERM GOAL #3   Title Patient will demo ability to complete sit <> stand x 5 reps with supervision to demonstrate improved balance and functional mobility    Baseline CGA - Min A    Time 8    Period Weeks    Status New      PT LONG TERM GOAL #4   Title Patient will demo ability to ambulate > 400 ft w/ LRAD versus hand held assist min assist to demonstrate improved household mobility and short community distances.    Baseline TBA    Time 8    Period Weeks    Status Revised                 Plan - 12/17/19 1810    Clinical Impression Statement Today's skiled PT session focused on gait training, LE and core strengthening, and sitting and standing balance. Pt presented  today with cognitive deficits, severe impulsivity and ataxic, jerky and uncoordinated gait. PT attempted using FWW with pt being very unsafe and unsteady by veering to the left and pickup up FWW off ground during gait training. At this time, pt required 2-person HHA for safe ambulation. Patient will benefit from skilled PT services to improve functional mobility and reduce fall risk. PT to continue working towards ST and LT goals.    Personal Factors and Comorbidities Comorbidity 1;Behavior Pattern;Transportation    Comorbidities HTN    Examination-Activity Limitations Bed Mobility;Dressing;Hygiene/Grooming;Caring for Others;Stairs;Stand;Toileting;Transfers;Sit;Bathing    Examination-Participation Restrictions Occupation;Community Activity;Medication Management;Cleaning    Stability/Clinical Decision Making Evolving/Moderate complexity    Rehab Potential Good    PT Frequency 2x / week    PT Duration 8 weeks    PT Treatment/Interventions ADLs/Self Care Home Management;Aquatic Therapy;Electrical Stimulation;DME Instruction;Gait training;Stair training;Functional mobility training;Therapeutic activities;Therapeutic exercise;Balance training;Neuromuscular re-education;Wheelchair mobility training;Patient/family education;Orthotic Fit/Training;Manual techniques;Passive range of motion    PT Next Visit Plan Initiate seated/supine strengthening HEP, work on sitting and standing balance, BLE strengthening, Core strengthening.    Consulted and Agree with Plan of Care Patient;Family member/caregiver    Family Member Consulted Mom/Dad           Patient will benefit from skilled therapeutic intervention in order to improve the following deficits and  impairments:  Abnormal gait, Decreased balance, Decreased endurance, Decreased mobility, Difficulty walking, Impaired tone, Impaired sensation, Pain, Impaired UE functional use, Decreased strength, Decreased safety awareness, Decreased knowledge of use of DME,  Decreased coordination, Decreased activity tolerance, Decreased cognition, Decreased range of motion  Visit Diagnosis: Unsteadiness on feet  Other abnormalities of gait and mobility  Muscle weakness (generalized)  Other lack of coordination     Problem List Patient Active Problem List   Diagnosis Date Noted   Coronary artery disease involving native coronary artery of native heart without angina pectoris 12/12/2019   Ischemic cardiomyopathy 12/12/2019   Brain injury (HCC)    Prediabetes    Anoxic brain injury (HCC) 11/05/2019   Dysphagia 11/05/2019   Physical deconditioning 11/05/2019   Acute delirium 11/05/2019   Respiratory failure (HCC)    Acute metabolic encephalopathy    Encounter for central line placement    Ventricular fibrillation (HCC) 09/23/2019   Acute combined systolic and diastolic heart failure (HCC) 09/23/2019   Ventricular tachycardia, polymorphic (HCC) 09/23/2019   Pelvic pain affecting pregnancy 10/15/2015   Supervision of normal pregnancy in third trimester 09/28/2015   Poor weight gain of pregnancy 09/28/2015   Iron deficiency anemia of pregnancy 09/01/2015   Increased BMI (body mass index) 07/29/2015    Isabella Bowensanielle Keric Zehren, SPT 12/18/2019, 8:42 AM  [facility] [address] [place], [place], [address] Phone: [phone]   Fax:  [phone]  Name: Ventura BrunsShatoya A Lollis MRN: [id number] Date of Birth: 09/02/1985

## 2019-12-17 NOTE — Therapy (Signed)
[facility] [address] [place], [place], [address] Phone: [phone]   Fax:  [phone]  Speech Language Pathology Treatment  Patient Details  Name: Beverly [name] MRN: [id number] Date of Birth: 09/19/1985 Referring Provider (SLP): Dr. Candelaria Stagers   Encounter Date: 12/17/2019   End of Session - 12/17/19 1449    Visit Number 2    Number of Visits 25    Date for SLP Re-Evaluation 03/03/20    Authorization Type awaiting medicaid    SLP Start Time 1324   7 minutes late   SLP Stop Time  1400    SLP Time Calculation (min) 36 min    Activity Tolerance Patient tolerated treatment well           Past Medical History:  Diagnosis Date  . Brain injury (HCC)   . Hypertension    last pregnancy  . Ischemic cardiomyopathy   . STEMI (ST elevation myocardial infarction) (HCC) 09/2019   SCAD with aneurysmal dilation of proximal LAD.  . Vaginal Pap smear, abnormal    when she was 34yo  . Ventricular fibrillation (HCC) 09/23/2019    Past Surgical History:  Procedure Laterality Date  . IR GASTROSTOMY TUBE MOD SED  10/07/2019  . LEFT HEART CATH AND CORONARY ANGIOGRAPHY N/A 09/23/2019   Procedure: LEFT HEART CATH AND CORONARY ANGIOGRAPHY;  Surgeon: Yvonne Kendall, MD;  Location: [facility] INVASIVE CV LAB;  Service: Cardiovascular;  Laterality: N/A;    There were no vitals filed for this visit.          ADULT SLP TREATMENT - 12/17/19 1426      General Information   Behavior/Cognition Pleasant mood;Impulsive;Distractible;Requires cueing;Decreased sustained attention      Treatment Provided   Treatment provided Cognitive-Linquistic      Cognitive-Linquistic Treatment   Treatment focused on Cognition;Aphasia    Skilled Treatment SLP targeted pt's sustained attention today with a picture sorting/picture naming task of 5 minutes with occasional min cues back to task. After the task, SLP asked pt the items in each  category (fruit, veggie, drink). Pt recalled 70% of items spontaneously however total may have been more due to pt's expressive aphasia. Pt was shown her appointments today and SLP worte them down for pt to reference later - she req'd cues each of 4 times SLP asked her about appointments and cued her every time to look at the paper, which she req'd mod cues to do consistently. SLP suggested some simple tasks with langage to do at home to improve attention.      Assessment / Recommendations / Plan   Plan Continue with current plan of care      Progression Toward Goals   Progression toward goals Progressing toward goals            SLP Education - 12/17/19 1448    Education Details home tasks    Person(s) Educated Patient    Methods Explanation    Comprehension Verbalized understanding            SLP Short Term Goals - 12/17/19 1450      SLP SHORT TERM GOAL #1   Title Beverly [name] will sustain attention for 5 minutes to simple cognitive task with occasional min A over 3 sessions    Baseline 1-2 minutes    Time 6    Period Weeks    Status On-going      SLP SHORT TERM GOAL #2   Title Pt will verbalize 2 safety  rules for walking at home with occasional min A over 3 sessions    Baseline pt states she can walk by herself    Time 6    Period Weeks    Status On-going      SLP SHORT TERM GOAL #3   Title Pt will use simple external aid to follow simple schedule and prepare for appointments with usual min A from family    Baseline does not recall or prepare for appointments    Time 6    Period Weeks    Status On-going      SLP SHORT TERM GOAL #4   Title Pt will use visual and written word cues to wash her face with usual min verbal cues from mom over 3 sessions    Baseline Per mom, pt only touches her face 2-3 times when asked to wash her face    Time 6    Period Weeks    Status On-going            SLP Long Term Goals - 12/17/19 1450      SLP LONG TERM GOAL #1   Title Pt will  use external aids/to do list to complete 3 simple chores a day with occasional min A over 3 sessions    Time 12    Period Weeks    Status On-going      SLP LONG TERM GOAL #2   Title Pt will sustain attention for 10 minutes to simple cognitive task with 4 or less re-directions over 3 sessions    Baseline sustains attention for 1-2 minutes    Time 12    Period Weeks    Status On-going      SLP LONG TERM GOAL #3   Title Pt will use visual cues and schedule to eat 2 meals a day and drink 24 oz of liquid with usual min A over 3 sessions    Baseline pt eating less than 1 meal a day and taking only sips (PEG is used)    Time 12    Period Weeks    Status On-going      SLP LONG TERM GOAL #4   Title Pt will use reduced rate to be intellgible in 5 minute conversation with occasional min A    Baseline Rapid rate, rushes of speech, 35% of utterances unintelligible    Time 12    Period Weeks    Status On-going            Plan - 12/17/19 1449    Clinical Impression Statement Beverly [name] is referred for outpt ST after cardiace arrest resulted in anoxic brain injury. She was hopsitalized 09/23/19 to 11/27/19. She is accompanied by her mom and dad. In the lobby, pt refusing to go back with clinician. Required consistent redirectioning and encouragement, perseveration "I'm not supposed to be here." Parents report inconsistent eating, depending on "her mood." She is eating approximately 1 meal a day inconsistently and not drinking enough. Parents are using PEG for primary nutrition and hydration, as well as meds. Mom reports Beverly [name] pulls at her PEG tube as she forgets she has a feeding tube. She inconsistently follows commands for grooming per mom. Today, Beverly [name] presents with severe cognitive linguistic impairments in attention, awareness, memory. Mild dysarthria affecting intelligiblity, which mom endorses at home. Beverly [name] exhibited confabulations throughout evaluation. She recalled 1/3 children, and  responded she was "34" years old despsite parents' cues. Beverly [name] stated an address she lived at a while  ago. She sustains attention for approximately 1-2 minutes.She is oriented to year, month, day but not date. Mom reports saftey concerns as Beverly [name] attempts to stand on her own at home. When asked, she replied "I can stand up by myself." Beverly [name] did follow 1 and 2 step written commands and read 3 5 word sentences with cues. Parents report that Beverly [name] stays up late pacing, and then sleeps late. I recommend skilled ST to maximize cogntion and intelligibility for safety, to reduce caregiver burden.    Speech Therapy Frequency 2x / week    Duration 12 weeks   or 25 visits   Treatment/Interventions Language facilitation;Environmental controls;Cueing hierarchy;SLP instruction and feedback;Aspiration precaution training;Compensatory techniques;Cognitive reorganization;Functional tasks;Compensatory strategies;Diet toleration management by SLP;Trials of upgraded texture/liquids;Internal/external aids;Multimodal communcation approach;Patient/family education    Potential to Achieve Goals Fair    Potential Considerations Severity of impairments;Financial resources           Patient will benefit from skilled therapeutic intervention in order to improve the following deficits and impairments:   Cognitive communication deficit    Problem List Patient Active Problem List   Diagnosis Date Noted  . Coronary artery disease involving native coronary artery of native heart without angina pectoris 12/12/2019  . Ischemic cardiomyopathy 12/12/2019  . Brain injury (HCC)   . Prediabetes   . Anoxic brain injury (HCC) 11/05/2019  . Dysphagia 11/05/2019  . Physical deconditioning 11/05/2019  . Acute delirium 11/05/2019  . Respiratory failure (HCC)   . Acute metabolic encephalopathy   . Encounter for central line placement   . Ventricular fibrillation (HCC) 09/23/2019  . Acute combined systolic and diastolic  heart failure (HCC) 02/14/3233  . Ventricular tachycardia, polymorphic (HCC) 09/23/2019  . Pelvic pain affecting pregnancy 10/15/2015  . Supervision of normal pregnancy in third trimester 09/28/2015  . Poor weight gain of pregnancy 09/28/2015  . Iron deficiency anemia of pregnancy 09/01/2015  . Increased BMI (body mass index) 07/29/2015    Bristal Steffy ,MS, CCC-SLP  12/17/2019, 2:51 PM  [facility] [address] [place], [place], [address] Phone: [phone]   Fax:  [phone]   Name: AUDRA KAGEL MRN: [id number] Date of Birth: 09-Apr-1985

## 2019-12-23 ENCOUNTER — Ambulatory Visit: Payer: Medicaid Other | Admitting: Occupational Therapy

## 2019-12-23 ENCOUNTER — Encounter: Payer: Self-pay | Admitting: Physical Therapy

## 2019-12-23 ENCOUNTER — Encounter: Payer: Self-pay | Admitting: Occupational Therapy

## 2019-12-23 ENCOUNTER — Other Ambulatory Visit: Payer: Self-pay

## 2019-12-23 ENCOUNTER — Ambulatory Visit: Payer: Medicaid Other | Admitting: Physical Therapy

## 2019-12-23 DIAGNOSIS — R29818 Other symptoms and signs involving the nervous system: Secondary | ICD-10-CM

## 2019-12-23 DIAGNOSIS — R2689 Other abnormalities of gait and mobility: Secondary | ICD-10-CM

## 2019-12-23 DIAGNOSIS — R41840 Attention and concentration deficit: Secondary | ICD-10-CM

## 2019-12-23 DIAGNOSIS — R2681 Unsteadiness on feet: Secondary | ICD-10-CM

## 2019-12-23 DIAGNOSIS — R262 Difficulty in walking, not elsewhere classified: Secondary | ICD-10-CM | POA: Diagnosis not present

## 2019-12-23 DIAGNOSIS — R278 Other lack of coordination: Secondary | ICD-10-CM

## 2019-12-23 DIAGNOSIS — M6281 Muscle weakness (generalized): Secondary | ICD-10-CM

## 2019-12-23 DIAGNOSIS — R41844 Frontal lobe and executive function deficit: Secondary | ICD-10-CM

## 2019-12-23 NOTE — Patient Instructions (Signed)
Access Code: [email or web address] URL: [email or web address] Date: 12/23/2019 Prepared by: Sallyanne Kuster  Exercises Bridge - 1 x daily - 5 x weekly - 1 sets - 10 reps Supine March with Resistance Band - 1 x daily - 5 x weekly - 1 sets - 10 reps Hooklying Single Leg Bent Knee Fallouts with Resistance - 1 x daily - 5 x weekly - 1 sets - 10 reps Sit to Stand with Counter Support - 1 x daily - 5 x weekly - 1 sets - 10 reps

## 2019-12-24 NOTE — Therapy (Signed)
[facility] [address] [place], [place], [address] Phone: [phone]   Fax:  [phone]  Occupational Therapy Evaluation  Patient Details  Name: Beverly [name] MRN: [id number] Date of Birth: April 24, 1985 Referring Provider (OT): Candelaria Stagers MD   Encounter Date: 12/23/2019   OT End of Session - 12/23/19 1610    Visit Number 1    Number of Visits 24    Date for OT Re-Evaluation 03/16/20    Authorization Type Medicaid OON    OT Start Time 1450    OT Stop Time 1530    OT Time Calculation (min) 40 min    Behavior During Therapy Impulsive           Past Medical History:  Diagnosis Date  . Brain injury (HCC)   . Hypertension    last pregnancy  . Ischemic cardiomyopathy   . STEMI (ST elevation myocardial infarction) (HCC) 09/2019   SCAD with aneurysmal dilation of proximal LAD.  . Vaginal Pap smear, abnormal    when she was 34yo  . Ventricular fibrillation (HCC) 09/23/2019    Past Surgical History:  Procedure Laterality Date  . IR GASTROSTOMY TUBE MOD SED  10/07/2019  . LEFT HEART CATH AND CORONARY ANGIOGRAPHY N/A 09/23/2019   Procedure: LEFT HEART CATH AND CORONARY ANGIOGRAPHY;  Surgeon: Yvonne Kendall, MD;  Location: [facility] INVASIVE CV LAB;  Service: Cardiovascular;  Laterality: N/A;    There were no vitals filed for this visit.   Subjective Assessment - 12/24/19 0835    Subjective  Pt denies pain    Pertinent History On 09/23/19 patient presented to ED 2 weeks postpartum (had emergent C-Section) with chest pain. In ED sustained VFib Cardiac Arrest requiring intubation. EKG showed ST elevation, Cath Lab showed aneurysmal dissection of the LAD was identified. Course complicated by encephalopathy, anoxic brain injury, enterobacterial PNAPMH: HTN    Limitations PEG tube, dysphagia, impulsive    Patient Stated Goals to return to prior function as able    Currently in Pain? No/denies             [facility] OT  Assessment - 12/24/19 0001      Assessment   Medical Diagnosis Anoxic Brain Injury    Referring Provider (OT) Candelaria Stagers MD    Onset Date/Surgical Date 09/23/19    Prior Therapy Acute Care      Precautions   Precautions Fall    Precaution Comments Dysphagia Diet, PEG for meds/supplements    Required Braces or Orthoses --   hand splints     Balance Screen   Has the patient fallen in the past 6 months No    Has the patient had a decrease in activity level because of a fear of falling?  No    Is the patient reluctant to leave their home because of a fear of falling?  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      ADL   Eating/Feeding --   swallowing difficulty, using peg tube, dysphagia diet   Grooming Moderate assistance   needs assist for  thoroughness   Upper Body Bathing Maximal assistance    Lower Body Bathing Maximal assistance    Upper Body Dressing Minimal assistance    Lower Body Dressing Maximal assistance    Toilet Transfer Minimal assistance   walks to bathroom    Tub/Shower Transfer --   not performing     IADL   Shopping Completely unable to shop    Light Housekeeping Does not participate in any housekeeping tasks    Meal Prep Needs to have meals prepared and served    Medication Management Is not capable of dispensing or managing own medication    Financial Management Dependent      Written Expression   Dominant Hand Left    Handwriting Not legible      Vision - History   Baseline Vision Wears glasses all the time      Vision Assessment   Vision Assessment Vision not tested      Cognition   Area of Impairment Attention;Memory;Following  commands;Safety/judgement;Awareness;Problem solving;Orientation    Orientation Level Person    Current Attention Level Focused    Following Commands Follows multi-step commands inconsistently    Safety/Judgement Decreased awareness of safety;Decreased awareness of deficits    Attention Sustained;Selective    Sustained Attention Impaired    Memory Impaired    Awareness Impairment Intellectual impairment    Behaviors Restless;Impulsive;Verbal agitation;Perseveration;Poor frustration tolerance;Confabulation      Coordination   Gross Motor Movements are Fluid and Coordinated No    Fine Motor Movements are Fluid and Coordinated No    Box and Blocks RUE 10 blocks LUE 12 blocks    Other spasticity noted in RUE with pt demonstrating difficulty with release of items    Coordination RUE is less coordinated than Left       AROM   Overall AROM  Deficits    Overall AROM Comments Shoulder, and elbow A/ROM are WFLS, Pt demonstrates decreased wrist flexion/ extension bilaterally and Decreased finger extension RUE due to spasticity,       Strength   Overall Strength Deficits;Unable to assess;Due to impaired cognition      Hand Function   Right Hand Grip (lbs) 20 lbs    Left Hand Grip (lbs) 36.8 lbs      RUE Tone   RUE Tone Mild;Hypertonic                             OT Short Term Goals - 12/23/19 1630      OT SHORT TERM GOAL #1   Title Pt/ caregiver will be I with inital HEP.    Baseline dependent    Time 6    Period Weeks    Status New    Target Date 02/03/20      OT SHORT TERM GOAL #2   Title Pt will perform UB dressing consistently with supervision/ set up and min v.c    Baseline min A    Time 6    Period Weeks    Status New      OT SHORT TERM GOAL #3   Title Pt will demonstrate sustained attention to a simple functional task for 2 mins, in a minimally distracting environment.    Baseline 1 min with re-direction    Time 6    Period Weeks    Status New        OT SHORT TERM GOAL #4   Title Pt will wash her face and  brush her teeth with no more than min v.c for thoroughness    Baseline mod A    Time 6    Period Weeks    Status New      OT SHORT TERM GOAL #5   Title Pt will demonstrate improved bilateral functional use as evidenced by increasing box/blocks score by 3 blocks    Baseline RUE10 blocks, LUE 12 blocks    Time 6    Period Weeks    Status New      OT SHORT TERM GOAL #6   Title Pt will demonstrate ability to sequence a simple functional or ADL task with no more than min v.c    Baseline Pt requires grossly mod A with sequencing simple activities    Time 6    Period Weeks    Status New             OT Long Term Goals - 12/23/19 1650      OT LONG TERM GOAL #1   Title Pt will perform LB bathing and dressing with no more than min A.    Baseline max A    Time 12    Period Weeks    Status New      OT LONG TERM GOAL #2   Title Pt will demonstrate improved bilateral UE functional use by increasing box/ blocks score by 6 blocks from inital measurement.    Baseline RUE 10 blocks, LUE 12 blocks    Time 12    Period Weeks    Status New      OT LONG TERM GOAL #3   Title Pt will perform simple cold snack prep with min A.    Baseline dependent    Time 12    Period Weeks    Status New      OT LONG TERM GOAL #4   Title Pt will attend to an ADL/ simple functional familiar task for 10 mins.    Baseline attends for grossly 1 min with re-direct.    Time 12    Period Weeks    Status New      OT LONG TERM GOAL #5   Title Pt perform a simple home management task with no more than min A, demonstrating good safety awareness.    Baseline dependent    Time 12    Period Weeks    Status New      Long Term Additional Goals   Additional Long Term Goals Yes      OT LONG TERM GOAL #6   Title Pt will write her name with 75% legibility    Baseline handwriting is illegibile    Time 12    Period Weeks    Status New                  Plan - 12/23/19 1616    Clinical Impression Statement Pt is a 34 y.o. female admitted 09/23/19 with intermittent chest discomfort; of note, pt 2-weeks post-partum (had emergent C-section); while in ED, pt with VF cardiac arrest requiring intubation. Brain MRI 8/23 with no acute abnormality. S/p trach and PEG tube 8/30. Course complicated by encephalopathy, anoxic brain injury, enterobacterial PNA. Transferred out of ICU On 9/4. PMH includes HTN. Pt presents to occupational therapy with the following deficits: cognitve deficits, decreased coordination, decreased balance, spasticity, decreased functional mobility which impedes performance of ADLS/ IADLs. Pt can benefit from skilled occupational therapy to address these deficits in order to maximize pt's  safety and I with daily activities. Pt is currently living with her mother, stepfather and 3 children at her mother's home.Pt was not admitted to CIR, she was d/c home from acute.    OT Occupational Profile and History Detailed Assessment- Review of Records and additional review of physical, cognitive, psychosocial history related to current functional performance    Occupational performance deficits (Please refer to evaluation for details): ADL's;IADL's;Work;Leisure;Social Participation    Body Structure / Function / Physical Skills ADL;Endurance;UE functional use;Balance;Pain;Flexibility;FMC;ROM;GMC;Coordination;Decreased knowledge of precautions;Decreased knowledge of use of DME;IADL;Dexterity;Strength;Mobility;Tone;Vision;Sensation;Gait    Cognitive Skills Attention;Energy/Drive;Memory;Orientation;Problem Solve;Safety Awareness;Thought;Understand    Rehab Potential Good    Clinical Decision Making Several treatment options, min-mod task modification necessary    Comorbidities Affecting Occupational Performance: May have comorbidities impacting occupational performance    Modification or Assistance to Complete Evaluation  Min-Moderate  modification of tasks or assist with assess necessary to complete eval    OT Frequency 2x / week    OT Duration 12 weeks plus eval   OT Treatment/Interventions Self-care/ADL training;Ultrasound;Visual/perceptual remediation/compensation;Patient/family education;Scar mobilization;Aquatic Therapy;Paraffin;Passive range of motion;Balance training;Dry needling;Stair Training;Fluidtherapy;Cryotherapy;Splinting;Moist Heat;Manual Therapy;Therapeutic exercise;Therapeutic activities;Functional Mobility Training;Neuromuscular education;Cognitive remediation/compensation    Plan assess splints if pt's mom brings in,  ADL strategies, simple functional use of UE's    Consulted and Agree with Plan of Care Patient;Family member/caregiver           Patient will benefit from skilled therapeutic intervention in order to improve the following deficits and impairments:   Body Structure / Function / Physical Skills: ADL, Endurance, UE functional use, Balance, Pain, Flexibility, FMC, ROM, GMC, Coordination, Decreased knowledge of precautions, Decreased knowledge of use of DME, IADL, Dexterity, Strength, Mobility, Tone, Vision, Sensation, Gait Cognitive Skills: Attention, Energy/Drive, Memory, Orientation, Problem Solve, Safety Awareness, Thought, Understand     Visit Diagnosis: Muscle weakness (generalized) - Plan: Ot plan of care cert/re-cert  Other lack of coordination - Plan: Ot plan of care cert/re-cert  Other abnormalities of gait and mobility - Plan: Ot plan of care cert/re-cert  Unsteadiness on feet - Plan: Ot plan of care cert/re-cert  Attention and concentration deficit - Plan: Ot plan of care cert/re-cert  Frontal lobe and executive function deficit - Plan: Ot plan of care cert/re-cert  Other symptoms and signs involving the nervous system - Plan: Ot plan of care cert/re-cert    Problem List Patient Active Problem List   Diagnosis Date Noted  . Coronary artery disease involving native  coronary artery of native heart without angina pectoris 12/12/2019  . Ischemic cardiomyopathy 12/12/2019  . Brain injury (HCC)   . Prediabetes   . Anoxic brain injury (HCC) 11/05/2019  . Dysphagia 11/05/2019  . Physical deconditioning 11/05/2019  . Acute delirium 11/05/2019  . Respiratory failure (HCC)   . Acute metabolic encephalopathy   . Encounter for central line placement   . Ventricular fibrillation (HCC) 09/23/2019  . Acute combined systolic and diastolic heart failure (HCC) 09/23/2019  . Ventricular tachycardia, polymorphic (HCC) 09/23/2019  . Pelvic pain affecting pregnancy 10/15/2015  . Supervision of normal pregnancy in third trimester 09/28/2015  . Poor weight gain of pregnancy 09/28/2015  . Iron deficiency anemia of pregnancy 09/01/2015  . Increased BMI (body mass index) 07/29/2015    Tigerlily Christine 12/24/2019, 8:43 AM Keene Breath, OTR/L [phone] Phone: [phone] 8:43 AM 12/24/19 [facility] [address] [place], [place], [address] Phone: [phone]   Fax:  [phone]  Name: Beverly [name] MRN: [id number] Date of  Birth: 01/14/1986

## 2019-12-24 NOTE — Therapy (Signed)
[facility] [address] [place], [place], [address] Phone: [phone]   Fax:  [phone]  Physical Therapy Treatment  Patient Details  Name: Beverly [name] MRN: [id number] Date of Birth: 02-10-1985 Referring Provider (PT): Almon Hercules, MD   Encounter Date: 12/23/2019     12/23/19 1411  PT Visits / Re-Eval  Visit Number 3  Number of Visits 17  Date for PT Re-Evaluation 03/09/20 (POC for 8 weeks, Cert for 90 days)  Authorization  Authorization Type Medicaid OON; Self Pay  PT Time Calculation  PT Start Time 1404  PT Stop Time 1444  PT Time Calculation (min) 40 min  PT - End of Session  Equipment Utilized During Treatment Gait belt  Activity Tolerance Patient tolerated treatment well  Behavior During Therapy Impulsive    Past Medical History:  Diagnosis Date   Brain injury (HCC)    Hypertension    last pregnancy   Ischemic cardiomyopathy    STEMI (ST elevation myocardial infarction) (HCC) 09/2019   SCAD with aneurysmal dilation of proximal LAD.   Vaginal Pap smear, abnormal    when she was 34yo   Ventricular fibrillation (HCC) 09/23/2019    Past Surgical History:  Procedure Laterality Date   IR GASTROSTOMY TUBE MOD SED  10/07/2019   LEFT HEART CATH AND CORONARY ANGIOGRAPHY N/A 09/23/2019   Procedure: LEFT HEART CATH AND CORONARY ANGIOGRAPHY;  Surgeon: Yvonne Kendall, MD;  Location: [facility] INVASIVE CV LAB;  Service: Cardiovascular;  Laterality: N/A;    There were no vitals filed for this visit.     12/23/19 1408  Symptoms/Limitations  Subjective No new complaints. No new falls. Mom does report that the pt continues to get up at times if left alone. Using a lap belt at home and now have a sensor on her bed.  Patient is accompained by: Family member (mom)  Pertinent History HTN  Limitations Sitting;Standing;Walking;House hold activities  Patient Stated Goals walk better  Pain Assessment   Currently in Pain? No/denies  Pain Score 0      12/23/19 1413  Transfers  Transfers Sit to Stand;Stand to [employer] Transfers  Sit to Stand 4: Min assist;With upper extremity assist;From chair/3-in-1;From bed  Sit to Stand Details Tactile cues for weight shifting;Tactile cues for sequencing;Verbal cues for sequencing;Verbal cues for technique;Verbal cues for precautions/safety  Stand to Sit 4: Min guard;With upper extremity assist;To bed;To chair/3-in-1  Stand to Sit Details (indicate cue type and reason) Tactile cues for sequencing;Verbal cues for sequencing;Verbal cues for technique;Verbal cues for precautions/safety  Stand Pivot Transfers 4: Min assist  Stand Pivot Transfer Details (indicate cue type and reason) wheelchair<>mat table with cues on posture, sequencing and step placement with weight shiftinng.   Neuro Re-ed   Neuro Re-ed Details  for balance/muscle re-ed: in standing working on stepping over foam bolster and back, alternating LE's. max cues to task and to slow down, weight shift and ensure one foot on floor before advancing other leg. up to mod/max assist with one unexpected sitting to mat needing assist to sit safely. Due to pt having difficulty with following directions with this task changed to PTA standing in front of pt with cues "follow my steps" with minimal improvement in sequencing and following of commads. mod/max assist with all standing coordination tasks.  Exercises  Exercises Other Exercises  Other Exercises  issued HEP to address strengthening. Refer to [employer] for full details. Min assist needed at times with constant cues to task  as pt is easily distracted through out session.      Issued the following to HEP today:  Access Code: [email or web address] URL: [email or web address] Date: 12/23/2019 Prepared by: Sallyanne Kuster  Exercises Bridge - 1 x daily - 5 x weekly - 1 sets - 10 reps Supine March with Resistance Band - 1 x daily - 5 x weekly - 1  sets - 10 reps Hooklying Single Leg Bent Knee Fallouts with Resistance - 1 x daily - 5 x weekly - 1 sets - 10 reps Sit to Stand with Counter Support - 1 x daily - 5 x weekly - 1 sets - 10 reps     12/23/19 2333  PT Education  Education Details HEP for strengthening  Person(s) Educated Patient;Parent(s)  Methods Explanation;Demonstration;Verbal cues;Handout  Comprehension Verbalized understanding;Returned demonstration;Verbal cues required;Need further instruction        PT Short Term Goals - 12/18/19 0839      PT SHORT TERM GOAL #1   Title Patient/caregiver will report independence with HEP with caregiver assistance (All STGs Due: 01/07/2020)    Baseline no HEP established    Time 4    Period Weeks    Status New    Target Date 01/07/20      PT SHORT TERM GOAL #2   Title Patient will demo ability to complete transfer from w/c <> mat with CGA with LRAD to demonstrate improved functional mobility    Baseline CGA - Min A    Time 4    Period Weeks    Status New    Target Date 01/07/20      PT SHORT TERM GOAL #3   Title Patient will demo ability to ambulate >/= 200 ft  with LRAD or hand hand assist with moderate assistance to demonstrate improved functional mobility in home.    Baseline currently max assist +2 hand held assist.    Time 4    Period Weeks    Status Revised             PT Long Term Goals - 12/18/19 0840      PT LONG TERM GOAL #1   Title Patient/caregiver will report indepence with final HEP with caregiver assistance (All LTGS: 02/04/2020)    Baseline no HEP established    Time 8    Period Weeks    Status New      PT LONG TERM GOAL #2   Title Patient will demo ability to complete all bed mobility Mod I to demonstrate improved independence    Baseline CGA/Supervision    Time 8    Period Weeks    Status New      PT LONG TERM GOAL #3   Title Patient will demo ability to complete sit <> stand x 5 reps with supervision to demonstrate improved balance  and functional mobility    Baseline CGA - Min A    Time 8    Period Weeks    Status New      PT LONG TERM GOAL #4   Title Patient will demo ability to ambulate > 400 ft w/ LRAD versus hand held assist min assist to demonstrate improved household mobility and short community distances.    Baseline TBA    Time 8    Period Weeks    Status Revised             12/23/19 1412  Plan  Clinical Impression Statement Today's skilled session focused on establishing an HEP for strengthening. Remainder  of session continued to focus on balance and cooridinaion with stepping. The pt continues to need increased assist with mobilit, along with cues for safety due to impulsivity. [name] mom present and educated along side patient. The pt should benefit from continued PT to progress toward unmet goals.  Personal Factors and Comorbidities Comorbidity 1;Behavior Pattern;Transportation  Comorbidities HTN  Examination-Activity Limitations Bed Mobility;Dressing;Hygiene/Grooming;Caring for Others;Stairs;Stand;Toileting;Transfers;Sit;Bathing  Examination-Participation Restrictions Occupation;Community Activity;Medication Management;Cleaning  Pt will benefit from skilled therapeutic intervention in order to improve on the following deficits Abnormal gait;Decreased balance;Decreased endurance;Decreased mobility;Difficulty walking;Impaired tone;Impaired sensation;Pain;Impaired UE functional use;Decreased strength;Decreased safety awareness;Decreased knowledge of use of DME;Decreased coordination;Decreased activity tolerance;Decreased cognition;Decreased range of motion  Stability/Clinical Decision Making Evolving/Moderate complexity  Rehab Potential Good  PT Frequency 2x / week  PT Duration 8 weeks  PT Treatment/Interventions ADLs/Self Care Home Management;Aquatic Therapy;Electrical Stimulation;DME Instruction;Gait training;Stair training;Functional mobility training;Therapeutic activities;Therapeutic exercise;Balance  training;Neuromuscular re-education;Wheelchair mobility training;Patient/family education;Orthotic Fit/Training;Manual techniques;Passive range of motion  PT Next Visit Plan work on sitting and standing balance, BLE strengthening, Core strengthening.  Consulted and Agree with Plan of Care Patient;Family member/caregiver  Family Member Consulted Mom          Patient will benefit from skilled therapeutic intervention in order to improve the following deficits and impairments:  Abnormal gait, Decreased balance, Decreased endurance, Decreased mobility, Difficulty walking, Impaired tone, Impaired sensation, Pain, Impaired UE functional use, Decreased strength, Decreased safety awareness, Decreased knowledge of use of DME, Decreased coordination, Decreased activity tolerance, Decreased cognition, Decreased range of motion  Visit Diagnosis: Muscle weakness (generalized)  Other lack of coordination  Unsteadiness on feet  Other abnormalities of gait and mobility     Problem List Patient Active Problem List   Diagnosis Date Noted   Coronary artery disease involving native coronary artery of native heart without angina pectoris 12/12/2019   Ischemic cardiomyopathy 12/12/2019   Brain injury (HCC)    Prediabetes    Anoxic brain injury (HCC) 11/05/2019   Dysphagia 11/05/2019   Physical deconditioning 11/05/2019   Acute delirium 11/05/2019   Respiratory failure (HCC)    Acute metabolic encephalopathy    Encounter for central line placement    Ventricular fibrillation (HCC) 09/23/2019   Acute combined systolic and diastolic heart failure (HCC) 09/23/2019   Ventricular tachycardia, polymorphic (HCC) 09/23/2019   Pelvic pain affecting pregnancy 10/15/2015   Supervision of normal pregnancy in third trimester 09/28/2015   Poor weight gain of pregnancy 09/28/2015   Iron deficiency anemia of pregnancy 09/01/2015   Increased BMI (body mass index) 07/29/2015    Sallyanne Kuster, PTA, [facility] [address] [place], [place] [address] [phone] 12/24/19, 11:19 PM   Name: Beverly [name] MRN: [id number] Date of Birth: [birth year]

## 2019-12-25 ENCOUNTER — Other Ambulatory Visit: Payer: Self-pay

## 2019-12-25 ENCOUNTER — Ambulatory Visit: Payer: Medicaid Other

## 2019-12-25 DIAGNOSIS — R2681 Unsteadiness on feet: Secondary | ICD-10-CM

## 2019-12-25 DIAGNOSIS — M6281 Muscle weakness (generalized): Secondary | ICD-10-CM

## 2019-12-25 DIAGNOSIS — R262 Difficulty in walking, not elsewhere classified: Secondary | ICD-10-CM

## 2019-12-25 DIAGNOSIS — R2689 Other abnormalities of gait and mobility: Secondary | ICD-10-CM

## 2019-12-25 DIAGNOSIS — R278 Other lack of coordination: Secondary | ICD-10-CM

## 2019-12-25 NOTE — Therapy (Signed)
[facility] [address] [place], [place], [address] Phone: [phone]   Fax:  [phone]  Physical Therapy Treatment  Patient Details  Name: Beverly [name] MRN: [id number] Date of Birth: 12/31/1985 Referring Provider (PT): Almon HerculesGonfa, Taye T, MD   Encounter Date: 12/25/2019   PT End of Session - 12/25/19 1620    Visit Number 4    Number of Visits 17    Date for PT Re-Evaluation 03/09/20   POC for 8 weeks, Cert for 90 days   Authorization Type Medicaid OON; Self Pay    PT Start Time 1616    PT Stop Time 1700    PT Time Calculation (min) 44 min    Equipment Utilized During Treatment Gait belt    Activity Tolerance Patient tolerated treatment well    Behavior During Therapy Impulsive           Past Medical History:  Diagnosis Date  . Brain injury (HCC)   . Hypertension    last pregnancy  . Ischemic cardiomyopathy   . STEMI (ST elevation myocardial infarction) (HCC) 09/2019   SCAD with aneurysmal dilation of proximal LAD.  . Vaginal Pap smear, abnormal    when she was 34yo  . Ventricular fibrillation (HCC) 09/23/2019    Past Surgical History:  Procedure Laterality Date  . IR GASTROSTOMY TUBE MOD SED  10/07/2019  . LEFT HEART CATH AND CORONARY ANGIOGRAPHY N/A 09/23/2019   Procedure: LEFT HEART CATH AND CORONARY ANGIOGRAPHY;  Surgeon: Yvonne KendallEnd, Christopher, MD;  Location: [facility] INVASIVE CV LAB;  Service: Cardiovascular;  Laterality: N/A;    There were no vitals filed for this visit.   Subjective Assessment - 12/25/19 1621    Subjective Patietn reports no new changes/complaints. No new falls since last visit. No pain to report today. Mom reports that HEP went well when completed last night.    Patient is accompained by: Family member   mom   Pertinent History HTN    Limitations Sitting;Standing;Walking;House hold activities    Patient Stated Goals walk better    Currently in Pain? No/denies                              [facility] Adult PT Treatment/Exercise - 12/25/19 0001      Transfers   Transfers Sit to Stand;Stand to Sit;Stand Pivot Transfers    Sit to Stand 4: Min assist;With upper extremity assist;From chair/3-in-1;From bed    Sit to Stand Details Tactile cues for weight shifting;Tactile cues for sequencing;Verbal cues for sequencing;Verbal cues for technique;Verbal cues for precautions/safety    Sit to Stand Details (indicate cue type and reason) PT continue to provide verbal cues for hand placement prior to standing    Stand to Sit 4: Min guard;With upper extremity assist;To bed;To chair/3-in-1    Stand to Sit Details (indicate cue type and reason) Tactile cues for sequencing;Verbal cues for sequencing;Verbal cues for technique;Verbal cues for precautions/safety    Stand Pivot Transfers 4: Min assist    Stand Pivot Transfer Details (indicate cue type and reason) completed from w/c <> mat, PT continue to provide verbal cues step placemetn and cues for turn to ensure alignment with surface prior to descent.     Comments completed sit <> stand training working on improved sequencing and hand placement, along with improved forward lean to promote improved safety with transfers. Min A required throughout for balance upon standing. completed x 6 reps  with mirror placed in front and PT providing tactile cues for improved alignment.       Neuro Re-ed    Neuro Re-ed Details  for balance/muscle re-ed completed the following standing activites: with HHA from PT (PT positioned in front of patient), completed standing with weight shifts to R/L x 15 reps. Progressed to completed forward/backwards steps alternating BLE x 10 reps, Min A for balance. Standing with CGA - Min A from PT completed standing reaching and placing pegs on peg board, including reaching forward and laterally with alternating UE support. increased dififculty manipulating pegs in hands, require assistance from PT at  times, completed this activity stadnign without rest break x 8 minutes. For seated balance, on edge of mat, completed reaching for cone across midline, upward and downward x 5 minutes. Intermittent Min A as patient demo 1-2 instances of increased forward lean requiring assistance for balance. With 1# weighted ball, completed trunk rotations x 10 reps bilaterally to colored targets. max verbal cues required to ensure proper completion, as patient often would tap mat vs. target. Intermittetn rest breaks required throughout activities.       Exercises   Exercises Other Exercises    Other Exercises  completed alternating seated marching x 10 reps with BLE and without UE support to promote improved core control, verbal cues for posture. Completd seated alteranating toe taps to colored cones to work on improved coordination, x 10 reps forward and progressed x 10 reps with crossover.                   PT Education - 12/25/19 1838    Education Details Educated Mom on Start Date for Therapy Services at [employer]) Educated Patient;Parent(s)    Methods Explanation    Comprehension Verbalized understanding            PT Short Term Goals - 12/18/19 0839      PT SHORT TERM GOAL #1   Title Patient/caregiver will report independence with HEP with caregiver assistance (All STGs Due: 01/07/2020)    Baseline no HEP established    Time 4    Period Weeks    Status New    Target Date 01/07/20      PT SHORT TERM GOAL #2   Title Patient will demo ability to complete transfer from w/c <> mat with CGA with LRAD to demonstrate improved functional mobility    Baseline CGA - Min A    Time 4    Period Weeks    Status New    Target Date 01/07/20      PT SHORT TERM GOAL #3   Title Patient will demo ability to ambulate >/= 200 ft  with LRAD or hand hand assist with moderate assistance to demonstrate improved functional mobility in home.    Baseline currently max assist +2 hand held assist.     Time 4    Period Weeks    Status Revised             PT Long Term Goals - 12/18/19 0840      PT LONG TERM GOAL #1   Title Patient/caregiver will report indepence with final HEP with caregiver assistance (All LTGS: 02/04/2020)    Baseline no HEP established    Time 8    Period Weeks    Status New      PT LONG TERM GOAL #2   Title Patient will demo ability to complete all bed mobility Mod I  to demonstrate improved independence    Baseline CGA/Supervision    Time 8    Period Weeks    Status New      PT LONG TERM GOAL #3   Title Patient will demo ability to complete sit <> stand x 5 reps with supervision to demonstrate improved balance and functional mobility    Baseline CGA - Min A    Time 8    Period Weeks    Status New      PT LONG TERM GOAL #4   Title Patient will demo ability to ambulate > 400 ft w/ LRAD versus hand held assist min assist to demonstrate improved household mobility and short community distances.    Baseline TBA    Time 8    Period Weeks    Status Revised                 Plan - 12/25/19 1839    Clinical Impression Statement Today's skilled PT session included continued focus on seated and standing balance with additions of task, increased verbal cues required due to difficulty with cues at times. Patient still require min A with mobility and verbal cues for improved safety. Patient will continue to benefit from skilled PT services to progress toward all goals.    Personal Factors and Comorbidities Comorbidity 1;Behavior Pattern;Transportation    Comorbidities HTN    Examination-Activity Limitations Bed Mobility;Dressing;Hygiene/Grooming;Caring for Others;Stairs;Stand;Toileting;Transfers;Sit;Bathing    Examination-Participation Restrictions Occupation;Community Activity;Medication Management;Cleaning    Stability/Clinical Decision Making Evolving/Moderate complexity    Rehab Potential Good    PT Frequency 2x / week    PT Duration 8 weeks    PT  Treatment/Interventions ADLs/Self Care Home Management;Aquatic Therapy;Electrical Stimulation;DME Instruction;Gait training;Stair training;Functional mobility training;Therapeutic activities;Therapeutic exercise;Balance training;Neuromuscular re-education;Wheelchair mobility training;Patient/family education;Orthotic Fit/Training;Manual techniques;Passive range of motion    PT Next Visit Plan Continue to work on seated and standing balance, BLE strengthening, core strengthening. Gait training    Consulted and Agree with Plan of Care Patient;Family member/caregiver    Family Member Consulted Mom/Dad           Patient will benefit from skilled therapeutic intervention in order to improve the following deficits and impairments:  Abnormal gait, Decreased balance, Decreased endurance, Decreased mobility, Difficulty walking, Impaired tone, Impaired sensation, Pain, Impaired UE functional use, Decreased strength, Decreased safety awareness, Decreased knowledge of use of DME, Decreased coordination, Decreased activity tolerance, Decreased cognition, Decreased range of motion  Visit Diagnosis: Muscle weakness (generalized)  Unsteadiness on feet  Other abnormalities of gait and mobility  Other lack of coordination  Difficulty in walking, not elsewhere classified     Problem List Patient Active Problem List   Diagnosis Date Noted  . Coronary artery disease involving native coronary artery of native heart without angina pectoris 12/12/2019  . Ischemic cardiomyopathy 12/12/2019  . Brain injury (HCC)   . Prediabetes   . Anoxic brain injury (HCC) 11/05/2019  . Dysphagia 11/05/2019  . Physical deconditioning 11/05/2019  . Acute delirium 11/05/2019  . Respiratory failure (HCC)   . Acute metabolic encephalopathy   . Encounter for central line placement   . Ventricular fibrillation (HCC) 09/23/2019  . Acute combined systolic and diastolic heart failure (HCC) 09/23/2019  . Ventricular  tachycardia, polymorphic (HCC) 09/23/2019  . Pelvic pain affecting pregnancy 10/15/2015  . Supervision of normal pregnancy in third trimester 09/28/2015  . Poor weight gain of pregnancy 09/28/2015  . Iron deficiency anemia of pregnancy 09/01/2015  . Increased BMI (body mass index) 07/29/2015  Tempie Donning, PT, DPT 12/25/2019, 6:44 PM  [facility] [address] [place], [place], [address] Phone: [phone]   Fax:  [phone]  Name: Beverly [name] MRN: [id number] Date of Birth: Oct 23, 1985

## 2019-12-29 ENCOUNTER — Ambulatory Visit: Payer: Medicaid Other | Admitting: Occupational Therapy

## 2019-12-29 ENCOUNTER — Ambulatory Visit: Payer: Medicaid Other

## 2019-12-29 ENCOUNTER — Other Ambulatory Visit: Payer: Self-pay

## 2019-12-29 DIAGNOSIS — R262 Difficulty in walking, not elsewhere classified: Secondary | ICD-10-CM | POA: Diagnosis not present

## 2019-12-29 DIAGNOSIS — M6281 Muscle weakness (generalized): Secondary | ICD-10-CM

## 2019-12-29 DIAGNOSIS — R41844 Frontal lobe and executive function deficit: Secondary | ICD-10-CM

## 2019-12-29 DIAGNOSIS — R278 Other lack of coordination: Secondary | ICD-10-CM

## 2019-12-29 DIAGNOSIS — R41840 Attention and concentration deficit: Secondary | ICD-10-CM

## 2019-12-29 DIAGNOSIS — R2689 Other abnormalities of gait and mobility: Secondary | ICD-10-CM

## 2019-12-29 DIAGNOSIS — R2681 Unsteadiness on feet: Secondary | ICD-10-CM

## 2019-12-29 NOTE — Therapy (Addendum)
[facility] [address] [place], [place], [address] Phone: [phone]   Fax:  [phone]  Physical Therapy Treatment  Patient Details  Name: Beverly [name] MRN: [id number] Date of Birth: 01-Nov-1985 Referring Provider (PT): Almon Hercules, MD   Encounter Date: 12/29/2019   PT End of Session - 12/29/19 1402    Visit Number 5    Number of Visits 17    Date for PT Re-Evaluation 03/09/20   POC for 8 weeks, Cert for 90 days   Authorization Type Medicaid OON; Self Pay    PT Start Time 1400    PT Stop Time 1443    PT Time Calculation (min) 43 min    Equipment Utilized During Treatment Gait belt    Activity Tolerance Patient tolerated treatment well    Behavior During Therapy Impulsive           Past Medical History:  Diagnosis Date  . Brain injury (HCC)   . Hypertension    last pregnancy  . Ischemic cardiomyopathy   . STEMI (ST elevation myocardial infarction) (HCC) 09/2019   SCAD with aneurysmal dilation of proximal LAD.  . Vaginal Pap smear, abnormal    when she was 34yo  . Ventricular fibrillation (HCC) 09/23/2019    Past Surgical History:  Procedure Laterality Date  . IR GASTROSTOMY TUBE MOD SED  10/07/2019  . LEFT HEART CATH AND CORONARY ANGIOGRAPHY N/A 09/23/2019   Procedure: LEFT HEART CATH AND CORONARY ANGIOGRAPHY;  Surgeon: Yvonne Kendall, MD;  Location: [facility] INVASIVE CV LAB;  Service: Cardiovascular;  Laterality: N/A;    There were no vitals filed for this visit.   Subjective Assessment - 12/29/19 1403    Subjective Pt just finished OT. They did see PCP who looked at feeding tube and said it looked ok. Still not set up with anyone to follow-up. Mom reports she is moving around and getting a little steadier on feet.    Patient is accompained by: Family member   mom   Pertinent History HTN    Limitations Sitting;Standing;Walking;House hold activities    Patient Stated Goals walk better     Currently in Pain? No/denies                             [facility] Adult PT Treatment/Exercise - 12/29/19 1404      Transfers   Transfers Sit to Stand;Stand to [employer] Transfers    Sit to Stand 4: Min assist    Sit to Stand Details Tactile cues for placement;Verbal cues for technique    Sit to Stand Details (indicate cue type and reason) Pt was cued to push from surface she is sitting on. Also instructed to get feet under knees prior to standing. Mom observing throughout.    Stand to Sit 4: Min guard;4: Min assist    Stand to Sit Details (indicate cue type and reason) Verbal cues for technique    Stand Pivot Transfers 4: Min assist    Stand Pivot Transfer Details (indicate cue type and reason) mat to/from w/c x 3 reps. Pt needed cues to push from surface she is rising from each time. Able to turn feet CGA.      Ambulation/Gait   Ambulation/Gait Yes    Ambulation/Gait Assistance 3: Mod assist   +2 hand held assist   Ambulation/Gait Assistance Details Pt was cued to take her time and focus on foot placement. Also cued  to stay up tall.    Ambulation Distance (Feet) 230 Feet    Assistive device --   hand held assist   Gait Pattern Step-through pattern;Decreased step length - right;Decreased step length - left;Ataxic    Ambulation Surface Level;Indoor      Neuro Re-ed    Neuro Re-ed Details  Standing in front of mat: reaching for targets with PT in front with light support on PT arms as needed CGA x 2 min. Then standing reaching across for 6 cones moving to stack with each arm. PT held stacked pile due to ataxia with pt knocking cones at times. Then reaching for 1 cone to touch across body x 10 each arm. Sitting edge of mat: holding 1.1# ball in front with both hands with trunk rotation to each side to [name] hand as target x 10 with verbal cueing to keep both hands on ball. Holding large purple ball in both hands raising overhead x 10 with cues to keep upright posture.  Seated tapping 3 floor dots on command x 3 each leg then alternating cone taps with cues to focus on control. Standing with hand held assist tapping floor dot in front and stepping back x 5 each leg min assist for stability. Pt had some difficulty following commands with sequence for this not bringing foot al the way back at times.                  PT Education - 12/29/19 1846    Education Details Transfer training with pt and mom.    Person(s) Educated Patient;Parent(s)    Methods Explanation;Demonstration    Comprehension Verbalized understanding            PT Short Term Goals - 12/18/19 0839      PT SHORT TERM GOAL #1   Title Patient/caregiver will report independence with HEP with caregiver assistance (All STGs Due: 01/07/2020)    Baseline no HEP established    Time 4    Period Weeks    Status New    Target Date 01/07/20      PT SHORT TERM GOAL #2   Title Patient will demo ability to complete transfer from w/c <> mat with CGA with LRAD to demonstrate improved functional mobility    Baseline CGA - Min A    Time 4    Period Weeks    Status New    Target Date 01/07/20      PT SHORT TERM GOAL #3   Title Patient will demo ability to ambulate >/= 200 ft  with LRAD or hand hand assist with moderate assistance to demonstrate improved functional mobility in home.    Baseline currently max assist +2 hand held assist.    Time 4    Period Weeks    Status Revised             PT Long Term Goals - 12/18/19 0840      PT LONG TERM GOAL #1   Title Patient/caregiver will report indepence with final HEP with caregiver assistance (All LTGS: 02/04/2020)    Baseline no HEP established    Time 8    Period Weeks    Status New      PT LONG TERM GOAL #2   Title Patient will demo ability to complete all bed mobility Mod I to demonstrate improved independence    Baseline CGA/Supervision    Time 8    Period Weeks    Status New  PT LONG TERM GOAL #3   Title Patient will  demo ability to complete sit <> stand x 5 reps with supervision to demonstrate improved balance and functional mobility    Baseline CGA - Min A    Time 8    Period Weeks    Status New      PT LONG TERM GOAL #4   Title Patient will demo ability to ambulate > 400 ft w/ LRAD versus hand held assist min assist to demonstrate improved household mobility and short community distances.    Baseline TBA    Time 8    Period Weeks    Status Revised                 Plan - 12/29/19 1847    Clinical Impression Statement Pt was able to show improving control in standing and sitting with activities today. Does continue ot have ataxia that affects stability. When slows down does better. Better foot placement with transfers and gait today.    Personal Factors and Comorbidities Comorbidity 1;Behavior Pattern;Transportation    Comorbidities HTN    Examination-Activity Limitations Bed Mobility;Dressing;Hygiene/Grooming;Caring for Others;Stairs;Stand;Toileting;Transfers;Sit;Bathing    Examination-Participation Restrictions Occupation;Community Activity;Medication Management;Cleaning    Stability/Clinical Decision Making Evolving/Moderate complexity    Rehab Potential Good    PT Frequency 2x / week    PT Duration 8 weeks    PT Treatment/Interventions ADLs/Self Care Home Management;Aquatic Therapy;Electrical Stimulation;DME Instruction;Gait training;Stair training;Functional mobility training;Therapeutic activities;Therapeutic exercise;Balance training;Neuromuscular re-education;Wheelchair mobility training;Patient/family education;Orthotic Fit/Training;Manual techniques;Passive range of motion    PT Next Visit Plan Check STGs. Continue to work on seated and standing balance, BLE strengthening, coordination activities, core strengthening. Gait training with hand held assist of 2 people.    Consulted and Agree with Plan of Care Patient;Family member/caregiver    Family Member Consulted Mom/Dad            Patient will benefit from skilled therapeutic intervention in order to improve the following deficits and impairments:  Abnormal gait, Decreased balance, Decreased endurance, Decreased mobility, Difficulty walking, Impaired tone, Impaired sensation, Pain, Impaired UE functional use, Decreased strength, Decreased safety awareness, Decreased knowledge of use of DME, Decreased coordination, Decreased activity tolerance, Decreased cognition, Decreased range of motion  Visit Diagnosis: Other abnormalities of gait and mobility  Muscle weakness (generalized)  Unsteadiness on feet     Problem List Patient Active Problem List   Diagnosis Date Noted  . Coronary artery disease involving native coronary artery of native heart without angina pectoris 12/12/2019  . Ischemic cardiomyopathy 12/12/2019  . Brain injury (HCC)   . Prediabetes   . Anoxic brain injury (HCC) 11/05/2019  . Dysphagia 11/05/2019  . Physical deconditioning 11/05/2019  . Acute delirium 11/05/2019  . Respiratory failure (HCC)   . Acute metabolic encephalopathy   . Encounter for central line placement   . Ventricular fibrillation (HCC) 09/23/2019  . Acute combined systolic and diastolic heart failure (HCC) 09/23/2019  . Ventricular tachycardia, polymorphic (HCC) 09/23/2019  . Pelvic pain affecting pregnancy 10/15/2015  . Supervision of normal pregnancy in third trimester 09/28/2015  . Poor weight gain of pregnancy 09/28/2015  . Iron deficiency anemia of pregnancy 09/01/2015  . Increased BMI (body mass index) 07/29/2015    Ronn Melena, PT, DPT, NCS 12/29/2019, 7:17 PM  [facility] [address] [place], [place], [address] Phone: [phone]   Fax:  [phone]  Name: Beverly [name] MRN: [id number] Date of Birth: 1985-05-22

## 2019-12-29 NOTE — Therapy (Signed)
[facility] [address] [place], [place], [address] Phone: [phone]   Fax:  [phone]  Occupational Therapy Treatment  Patient Details  Name: Beverly [name] MRN: [id number] Date of Birth: [birth year] Referring Provider (OT): Candelaria Stagers MD   Encounter Date: 12/29/2019   OT End of Session - 12/29/19 1419    Visit Number 2    Number of Visits 24    Date for OT Re-Evaluation 03/16/20    Authorization Type Medicaid OON    OT Start Time 1315    OT Stop Time 1400    OT Time Calculation (min) 45 min    Activity Tolerance Patient tolerated treatment well    Behavior During Therapy Impulsive           Past Medical History:  Diagnosis Date   Brain injury (HCC)    Hypertension    last pregnancy   Ischemic cardiomyopathy    STEMI (ST elevation myocardial infarction) (HCC) 09/2019   SCAD with aneurysmal dilation of proximal LAD.   Vaginal Pap smear, abnormal    when she was 34yo   Ventricular fibrillation (HCC) 09/23/2019    Past Surgical History:  Procedure Laterality Date   IR GASTROSTOMY TUBE MOD SED  10/07/2019   LEFT HEART CATH AND CORONARY ANGIOGRAPHY N/A 09/23/2019   Procedure: LEFT HEART CATH AND CORONARY ANGIOGRAPHY;  Surgeon: Yvonne Kendall, MD;  Location: [facility] INVASIVE CV LAB;  Service: Cardiovascular;  Laterality: N/A;    There were no vitals filed for this visit.   Subjective Assessment - 12/29/19 1318    Subjective  Pt denies pain    Patient is accompanied by: Family member   mom   Pertinent History On 09/23/19 patient presented to ED 2 weeks postpartum (had emergent C-Section) with chest pain. In ED sustained VFib Cardiac Arrest requiring intubation. EKG showed ST elevation, Cath Lab showed aneurysmal dissection of the LAD was identified. Course complicated by encephalopathy, anoxic brain injury, enterobacterial PNAPMH: HTN    Limitations PEG tube, dysphagia, impulsive    Patient Stated  Goals to return to prior function as able    Currently in Pain? No/denies           Practiced doffing jacket - pt required min assist for doffing (taking off Lt arm first, donning last) and donning w/ min to mod assist putting in Rt arm first. Pt doffed shirt pulling over head then arms w/ min assist, then donning shirt donning over head first, then Rt arm, then Lt arm (this method worked best for her) w/ min assist.  Pt able to doff shoes and socks with cues only, then donned socks using foot stool and cues to orient heels of socks properly. Pt able to don shoes w/ assist at heel of shoe. Pt dependent for tying shoes d/t coordination deficits and ataxia. Pt/mother shown options for shoe laces including spyrolaces, velcro converters, and shoe buttons. Practiced w/ shoe buttons - pt able to doff lace off button, but difficulty getting back around shoe button therefore other options may work best. Pt/mother provided handout on options and told how/where to purchase.                         OT Short Term Goals - 12/23/19 1630      OT SHORT TERM GOAL #1   Title Pt/ caregiver will be I with inital HEP.    Baseline dependent    Time 6  Period Weeks    Status New    Target Date 02/03/20      OT SHORT TERM GOAL #2   Title Pt will perform UB dressing consistently with supervision/ set up and min v.c    Baseline min A    Time 6    Period Weeks    Status New      OT SHORT TERM GOAL #3   Title Pt will demonstrate sustained attention to a simple functional task for 2 mins, in a minimally distracting environment.    Baseline 1 min with re-direction    Time 6    Period Weeks    Status New      OT SHORT TERM GOAL #4   Title Pt will wash her face and brush her teeth with no more than min v.c for thoroughness    Baseline mod A    Time 6    Period Weeks    Status New      OT SHORT TERM GOAL #5   Title Pt will demonstrate improved bilateral functional use as evidenced by  increasing box/blocks score by 3 blocks    Baseline RUE10 blocks, LUE 12 blocks    Time 6    Period Weeks    Status New      OT SHORT TERM GOAL #6   Title Pt will demonstrate ability to sequence a simple functional or ADL task with no more than min v.c    Baseline Pt requires grossly mod A with sequencing simple activities    Time 6    Period Weeks    Status New             OT Long Term Goals - 12/23/19 1650      OT LONG TERM GOAL #1   Title Pt will perform LB bathing and dressing with no more than min A.    Baseline max A    Time 12    Period Weeks    Status New      OT LONG TERM GOAL #2   Title Pt will demonstrate improved bilateral UE functional use by increasing box/ blocks score by 6 blocks from inital measurement.    Baseline RUE 10 blocks, LUE 12 blocks    Time 12    Period Weeks    Status New      OT LONG TERM GOAL #3   Title Pt will perform simple cold snack prep with min A.    Baseline dependent    Time 12    Period Weeks    Status New      OT LONG TERM GOAL #4   Title Pt will attend to an ADL/ simple functional familiar task for 10 mins.    Baseline attends for grossly 1 min with re-direct.    Time 12    Period Weeks    Status New      OT LONG TERM GOAL #5   Title Pt perform a simple home management task with no more than min A, demonstrating good safety awareness.    Baseline dependent    Time 12    Period Weeks    Status New      Long Term Additional Goals   Additional Long Term Goals Yes      OT LONG TERM GOAL #6   Title Pt will write her name with 75% legibility    Baseline handwriting is illegibile    Time 12    Period Weeks  Status New                 Plan - 12/29/19 1419    Clinical Impression Statement Pt with increased physical independence in dressing today, but requires cues, set up, and overall min assist    OT Occupational Profile and History Detailed Assessment- Review of Records and additional review of physical,  cognitive, psychosocial history related to current functional performance    Occupational performance deficits (Please refer to evaluation for details): ADL's;IADL's;Work;Leisure;Social Participation    Body Structure / Function / Physical Skills ADL;Endurance;UE functional use;Balance;Pain;Flexibility;FMC;ROM;GMC;Coordination;Decreased knowledge of precautions;Decreased knowledge of use of DME;IADL;Dexterity;Strength;Mobility;Tone;Vision;Sensation;Gait    Cognitive Skills Attention;Energy/Drive;Memory;Orientation;Problem Solve;Safety Awareness;Thought;Understand    Rehab Potential Good    Clinical Decision Making Several treatment options, min-mod task modification necessary    Comorbidities Affecting Occupational Performance: May have comorbidities impacting occupational performance   complex medical issues   Modification or Assistance to Complete Evaluation  Min-Moderate modification of tasks or assist with assess necessary to complete eval   Pt impulsive requiring redirects and increased time   OT Frequency 2x / week    OT Duration 12 weeks    OT Treatment/Interventions Self-care/ADL training;Ultrasound;Visual/perceptual remediation/compensation;Patient/family education;Scar mobilization;Aquatic Therapy;Paraffin;Passive range of motion;Balance training;Dry needling;Stair Training;Fluidtherapy;Cryotherapy;Splinting;Moist Heat;Manual Therapy;Therapeutic exercise;Therapeutic activities;Functional Mobility Training;Neuromuscular education;Cognitive remediation/compensation    Plan assess splints if pt's mom brings in, simple functional use of UE's, reinforce ADL strategies    Consulted and Agree with Plan of Care Patient;Family member/caregiver           Patient will benefit from skilled therapeutic intervention in order to improve the following deficits and impairments:   Body Structure / Function / Physical Skills: ADL, Endurance, UE functional use, Balance, Pain, Flexibility, FMC, ROM, GMC,  Coordination, Decreased knowledge of precautions, Decreased knowledge of use of DME, IADL, Dexterity, Strength, Mobility, Tone, Vision, Sensation, Gait Cognitive Skills: Attention, Energy/Drive, Memory, Orientation, Problem Solve, Safety Awareness, Thought, Understand     Visit Diagnosis: Other lack of coordination  Attention and concentration deficit  Frontal lobe and executive function deficit  Muscle weakness (generalized)    Problem List Patient Active Problem List   Diagnosis Date Noted   Coronary artery disease involving native coronary artery of native heart without angina pectoris 12/12/2019   Ischemic cardiomyopathy 12/12/2019   Brain injury (HCC)    Prediabetes    Anoxic brain injury (HCC) 11/05/2019   Dysphagia 11/05/2019   Physical deconditioning 11/05/2019   Acute delirium 11/05/2019   Respiratory failure (HCC)    Acute metabolic encephalopathy    Encounter for central line placement    Ventricular fibrillation (HCC) 09/23/2019   Acute combined systolic and diastolic heart failure (HCC) 09/23/2019   Ventricular tachycardia, polymorphic (HCC) 09/23/2019   Pelvic pain affecting pregnancy 10/15/2015   Supervision of normal pregnancy in third trimester 09/28/2015   Poor weight gain of pregnancy 09/28/2015   Iron deficiency anemia of pregnancy 09/01/2015   Increased BMI (body mass index) 07/29/2015    Kelli Churn, OTR/L 12/29/2019, 2:21 PM   [facility] [address] [place], [place], [address] Phone: [phone]   Fax:  [phone]  Name: Beverly [name] MRN: [id number] Date of Birth: [birth year]

## 2019-12-30 ENCOUNTER — Ambulatory Visit (INDEPENDENT_AMBULATORY_CARE_PROVIDER_SITE_OTHER): Payer: Medicaid Other

## 2019-12-30 DIAGNOSIS — I255 Ischemic cardiomyopathy: Secondary | ICD-10-CM

## 2019-12-30 DIAGNOSIS — I5022 Chronic systolic (congestive) heart failure: Secondary | ICD-10-CM

## 2019-12-30 LAB — ECHOCARDIOGRAM LIMITED
Area-P 1/2: 3.31 cm2
Calc EF: 52.2 %
S' Lateral: 3.9 cm
Single Plane A2C EF: 49.5 %
Single Plane A4C EF: 54.6 %

## 2019-12-31 ENCOUNTER — Other Ambulatory Visit: Payer: Self-pay

## 2019-12-31 ENCOUNTER — Ambulatory Visit: Payer: Medicaid Other | Admitting: Occupational Therapy

## 2019-12-31 ENCOUNTER — Ambulatory Visit: Payer: Medicaid Other

## 2019-12-31 ENCOUNTER — Encounter: Payer: Self-pay | Admitting: Occupational Therapy

## 2019-12-31 DIAGNOSIS — R262 Difficulty in walking, not elsewhere classified: Secondary | ICD-10-CM | POA: Diagnosis not present

## 2019-12-31 DIAGNOSIS — R2689 Other abnormalities of gait and mobility: Secondary | ICD-10-CM

## 2019-12-31 DIAGNOSIS — R2681 Unsteadiness on feet: Secondary | ICD-10-CM

## 2019-12-31 DIAGNOSIS — R278 Other lack of coordination: Secondary | ICD-10-CM

## 2019-12-31 DIAGNOSIS — R41840 Attention and concentration deficit: Secondary | ICD-10-CM

## 2019-12-31 DIAGNOSIS — R29818 Other symptoms and signs involving the nervous system: Secondary | ICD-10-CM

## 2019-12-31 DIAGNOSIS — M6281 Muscle weakness (generalized): Secondary | ICD-10-CM

## 2019-12-31 NOTE — Therapy (Signed)
[facility] [address] [place], [place], [address] Phone: [phone]   Fax:  [phone]  Occupational Therapy Treatment  Patient Details  Name: Beverly [name] MRN: [id number] Date of Birth: 07-16-1985 Referring Provider (OT): Candelaria Stagers MD   Encounter Date: 12/31/2019   OT End of Session - 12/31/19 1709    Visit Number 3    Number of Visits 24    Date for OT Re-Evaluation 03/16/20    Authorization Type Medicaid OON    OT Start Time 1530    OT Stop Time 1615    OT Time Calculation (min) 45 min    Activity Tolerance Patient tolerated treatment well    Behavior During Therapy Impulsive           Past Medical History:  Diagnosis Date  . Brain injury (HCC)   . Hypertension    last pregnancy  . Ischemic cardiomyopathy   . STEMI (ST elevation myocardial infarction) (HCC) 09/2019   SCAD with aneurysmal dilation of proximal LAD.  . Vaginal Pap smear, abnormal    when she was 34yo  . Ventricular fibrillation (HCC) 09/23/2019    Past Surgical History:  Procedure Laterality Date  . IR GASTROSTOMY TUBE MOD SED  10/07/2019  . LEFT HEART CATH AND CORONARY ANGIOGRAPHY N/A 09/23/2019   Procedure: LEFT HEART CATH AND CORONARY ANGIOGRAPHY;  Surgeon: Yvonne Kendall, MD;  Location: [facility] INVASIVE CV LAB;  Service: Cardiovascular;  Laterality: N/A;    There were no vitals filed for this visit.   Subjective Assessment - 12/31/19 1614    Subjective  Walking - regarding what she is working on in therapy    Patient is accompanied by: Family member    Pertinent History On 09/23/19 patient presented to ED 2 weeks postpartum (had emergent C-Section) with chest pain. In ED sustained VFib Cardiac Arrest requiring intubation. EKG showed ST elevation, Cath Lab showed aneurysmal dissection of the LAD was identified. Course complicated by encephalopathy, anoxic brain injury, enterobacterial PNAPMH: HTN    Limitations PEG tube,  dysphagia, impulsive    Patient Stated Goals to return to prior function as able    Currently in Pain? No/denies    Pain Score 0-No pain                        OT Treatments/Exercises (OP) - 12/31/19 0001      ADLs   UB Dressing Worked on front opening jacket using BUE.  Patient able to doff jacket without assistance.  Patient able to automatically dress left arm, but had difficulty reaching behind back or over shoulder to reach jacket to don right arm.  Patient states - it feels like I have nails in my fingers.      Functional Mobility Worked on walking with emphasis on speed and balance and reducing support.  Patient able to count steps with min prompts.        Cognitive Exercises   Other Cognitive Exercises 1 Worked on sorting two fields (red/black) and 5 fields (numbers 2-6)  Patient had no difficulty attending in min distracting environment for +74min.                    OT Education - 12/31/19 1709    Education Details come forward to stand, stand slowly    Person(s) Educated Patient;Parent(s)    Methods Explanation    Comprehension Need further instruction  OT Short Term Goals - 12/31/19 1712      OT SHORT TERM GOAL #1   Title Pt/ caregiver will be I with inital HEP.    Baseline dependent    Time 6    Period Weeks    Status On-going    Target Date 02/03/20      OT SHORT TERM GOAL #2   Title Pt will perform UB dressing consistently with supervision/ set up and min v.c    Baseline min A    Time 6    Period Weeks    Status On-going      OT SHORT TERM GOAL #3   Title Pt will demonstrate sustained attention to a simple functional task for 2 mins, in a minimally distracting environment.    Baseline 1 min with re-direction    Time 6    Period Weeks    Status Achieved      OT SHORT TERM GOAL #4   Title Pt will wash her face and brush her teeth with no more than min v.c for thoroughness    Baseline mod A    Time 6    Period Weeks      Status On-going      OT SHORT TERM GOAL #5   Title Pt will demonstrate improved bilateral functional use as evidenced by increasing box/blocks score by 3 blocks    Baseline RUE10 blocks, LUE 12 blocks    Time 6    Period Weeks    Status On-going      OT SHORT TERM GOAL #6   Title Pt will demonstrate ability to sequence a simple functional or ADL task with no more than min v.c    Baseline Pt requires grossly mod A with sequencing simple activities    Time 6    Period Weeks    Status On-going             OT Long Term Goals - 12/31/19 1713      OT LONG TERM GOAL #1   Title Pt will perform LB bathing and dressing with no more than min A.    Baseline max A    Time 12    Period Weeks    Status On-going      OT LONG TERM GOAL #2   Title Pt will demonstrate improved bilateral UE functional use by increasing box/ blocks score by 6 blocks from inital measurement.    Baseline RUE 10 blocks, LUE 12 blocks    Time 12    Period Weeks    Status On-going      OT LONG TERM GOAL #3   Title Pt will perform simple cold snack prep with min A.    Baseline dependent    Time 12    Period Weeks    Status On-going      OT LONG TERM GOAL #4   Title Pt will attend to an ADL/ simple functional familiar task for 10 mins.    Baseline attends for grossly 1 min with re-direct.    Time 12    Period Weeks    Status On-going      OT LONG TERM GOAL #5   Title Pt perform a simple home management task with no more than min A, demonstrating good safety awareness.    Baseline dependent    Time 12    Period Weeks    Status On-going      OT LONG TERM GOAL #6  Title Pt will write her name with 75% legibility    Baseline handwriting is illegibile    Time 12    Period Weeks    Status On-going                 Plan - 12/31/19 1710    Clinical Impression Statement Patient responding well to direct cues before activity.    OT Occupational Profile and History Detailed Assessment- Review  of Records and additional review of physical, cognitive, psychosocial history related to current functional performance    Occupational performance deficits (Please refer to evaluation for details): ADL's;IADL's;Work;Leisure;Social Participation    Body Structure / Function / Physical Skills ADL;Endurance;UE functional use;Balance;Pain;Flexibility;FMC;ROM;GMC;Coordination;Decreased knowledge of precautions;Decreased knowledge of use of DME;IADL;Dexterity;Strength;Mobility;Tone;Vision;Sensation;Gait    Cognitive Skills Attention;Energy/Drive;Memory;Orientation;Problem Solve;Safety Awareness;Thought;Understand    Rehab Potential Good    Clinical Decision Making Several treatment options, min-mod task modification necessary    Comorbidities Affecting Occupational Performance: May have comorbidities impacting occupational performance   complex medical issues   Modification or Assistance to Complete Evaluation  Min-Moderate modification of tasks or assist with assess necessary to complete eval   Pt impulsive requiring redirects and increased time   OT Frequency 2x / week    OT Duration 12 weeks    OT Treatment/Interventions Self-care/ADL training;Ultrasound;Visual/perceptual remediation/compensation;Patient/family education;Scar mobilization;Aquatic Therapy;Paraffin;Passive range of motion;Balance training;Dry needling;Stair Training;Fluidtherapy;Cryotherapy;Splinting;Moist Heat;Manual Therapy;Therapeutic exercise;Therapeutic activities;Functional Mobility Training;Neuromuscular education;Cognitive remediation/compensation    Plan assess splints if pt's mom brings in, simple functional use of UE's, reinforce ADL strategies    Consulted and Agree with Plan of Care Patient;Family member/caregiver           Patient will benefit from skilled therapeutic intervention in order to improve the following deficits and impairments:   Body Structure / Function / Physical Skills: ADL, Endurance, UE functional use,  Balance, Pain, Flexibility, FMC, ROM, GMC, Coordination, Decreased knowledge of precautions, Decreased knowledge of use of DME, IADL, Dexterity, Strength, Mobility, Tone, Vision, Sensation, Gait Cognitive Skills: Attention, Energy/Drive, Memory, Orientation, Problem Solve, Safety Awareness, Thought, Understand     Visit Diagnosis: Attention and concentration deficit  Other lack of coordination  Unsteadiness on feet  Muscle weakness (generalized)  Other symptoms and signs involving the nervous system    Problem List Patient Active Problem List   Diagnosis Date Noted  . Coronary artery disease involving native coronary artery of native heart without angina pectoris 12/12/2019  . Ischemic cardiomyopathy 12/12/2019  . Brain injury (HCC)   . Prediabetes   . Anoxic brain injury (HCC) 11/05/2019  . Dysphagia 11/05/2019  . Physical deconditioning 11/05/2019  . Acute delirium 11/05/2019  . Respiratory failure (HCC)   . Acute metabolic encephalopathy   . Encounter for central line placement   . Ventricular fibrillation (HCC) 09/23/2019  . Acute combined systolic and diastolic heart failure (HCC) 09/23/2019  . Ventricular tachycardia, polymorphic (HCC) 09/23/2019  . Pelvic pain affecting pregnancy 10/15/2015  . Supervision of normal pregnancy in third trimester 09/28/2015  . Poor weight gain of pregnancy 09/28/2015  . Iron deficiency anemia of pregnancy 09/01/2015  . Increased BMI (body mass index) 07/29/2015    Collier Salina, OTR/L 12/31/2019, 6:00 PM  [facility] [address] [place], [place], [address] Phone: [phone]   Fax:  [phone]  Name: CHAKIRA JACHIM MRN: [id number] Date of Birth: [birth year]

## 2019-12-31 NOTE — Therapy (Signed)
[facility] [address] [place], [place], [address] Phone: [phone]   Fax:  [phone]  Physical Therapy Treatment  Patient Details  Name: Beverly [name] MRN: [id number] Date of Birth: November 20, 1985 Referring Provider (PT): Almon Hercules, MD   Encounter Date: 12/31/2019   PT End of Session - 12/31/19 1452    Visit Number 6    Number of Visits 17    Date for PT Re-Evaluation 03/09/20   POC for 8 weeks, Cert for 90 days   Authorization Type Medicaid OON; Self Pay    PT Start Time 1451   pt arriving late   PT Stop Time 1530    PT Time Calculation (min) 39 min    Equipment Utilized During Treatment Gait belt    Activity Tolerance Patient tolerated treatment well    Behavior During Therapy Impulsive           Past Medical History:  Diagnosis Date  . Brain injury (HCC)   . Hypertension    last pregnancy  . Ischemic cardiomyopathy   . STEMI (ST elevation myocardial infarction) (HCC) 09/2019   SCAD with aneurysmal dilation of proximal LAD.  . Vaginal Pap smear, abnormal    when she was 34yo  . Ventricular fibrillation (HCC) 09/23/2019    Past Surgical History:  Procedure Laterality Date  . IR GASTROSTOMY TUBE MOD SED  10/07/2019  . LEFT HEART CATH AND CORONARY ANGIOGRAPHY N/A 09/23/2019   Procedure: LEFT HEART CATH AND CORONARY ANGIOGRAPHY;  Surgeon: Yvonne Kendall, MD;  Location: [facility] INVASIVE CV LAB;  Service: Cardiovascular;  Laterality: N/A;    There were no vitals filed for this visit.   Subjective Assessment - 12/31/19 1452    Subjective Patient reports no new complaints/changes since last visit. No pain and no falls. Reports appointment with cardiology went well.    Patient is accompained by: Family member   mom   Pertinent History HTN    Limitations Sitting;Standing;Walking;House hold activities    Patient Stated Goals walk better    Currently in Pain? No/denies              [facility] Adult  PT Treatment/Exercise - 12/31/19 0001      Transfers   Transfers Sit to Stand;Stand to Sit;Stand Pivot Transfers    Sit to Stand 4: Min assist    Sit to Stand Details Tactile cues for placement;Verbal cues for technique    Sit to Stand Details (indicate cue type and reason) Impulsive with sit <> stand today, Min A required. Patient often increased extension with BLE often pushing off mat. PT providing verbal cues and faciliation for improved forward lean. PT also providing verbal cues for slowed pace and control.     Stand to Sit 4: Min guard;4: Min assist    Stand to Sit Details (indicate cue type and reason) Verbal cues for technique    Stand to Sit Details decreased control with descent, verbal cues required.     Stand Pivot Transfers 4: Min assist    Stand Pivot Transfer Details (indicate cue type and reason) completed transfer from manual w/c <> mat, with HHA from PT working on controlled steps, Min A required for balance. verbal cues for completion.      Ambulation/Gait   Ambulation/Gait Yes    Ambulation/Gait Assistance 4: Min assist;3: Mod assist   +2 HHA   Ambulation/Gait Assistance Details Completed ambulation with assistance from OT. Completed ambulation x 115 ft with HHA bilaterally.  PT providing verbal cues for slowed pace to promote improved weight shift. PT providing manual faciliation for improved weight shift. Second lap x115 ft, OT providing HHA. PT providing manual faciliation ant/posteroir to promote improved trunk control with ambulation, faciliating improved step length. Patient often  ambulating with wide BOS, require freq stops to work on proper BOS and improved control with ambulation.      Ambulation Distance (Feet) 115 Feet   x 2   Assistive device 2 person hand held assist    Gait Pattern Step-through pattern;Decreased step length - right;Decreased step length - left;Ataxic    Ambulation Surface Level;Indoor      Neuro Re-ed    Neuro Re-ed Details  Completed  standing with HHA from PT, completed weight shift to R/L x 10 reps. Intermittent Min A due to decreased balance. With various colored targets on floor, and single HHA from PT. PT calling out specific color having patient step to that color with specific lower extremity. Increased verbal cues required for proper completion.                    PT Short Term Goals - 12/18/19 0839      PT SHORT TERM GOAL #1   Title Patient/caregiver will report independence with HEP with caregiver assistance (All STGs Due: 01/07/2020)    Baseline no HEP established    Time 4    Period Weeks    Status New    Target Date 01/07/20      PT SHORT TERM GOAL #2   Title Patient will demo ability to complete transfer from w/c <> mat with CGA with LRAD to demonstrate improved functional mobility    Baseline CGA - Min A    Time 4    Period Weeks    Status New    Target Date 01/07/20      PT SHORT TERM GOAL #3   Title Patient will demo ability to ambulate >/= 200 ft  with LRAD or hand hand assist with moderate assistance to demonstrate improved functional mobility in home.    Baseline currently max assist +2 hand held assist.    Time 4    Period Weeks    Status Revised             PT Long Term Goals - 12/18/19 0840      PT LONG TERM GOAL #1   Title Patient/caregiver will report indepence with final HEP with caregiver assistance (All LTGS: 02/04/2020)    Baseline no HEP established    Time 8    Period Weeks    Status New      PT LONG TERM GOAL #2   Title Patient will demo ability to complete all bed mobility Mod I to demonstrate improved independence    Baseline CGA/Supervision    Time 8    Period Weeks    Status New      PT LONG TERM GOAL #3   Title Patient will demo ability to complete sit <> stand x 5 reps with supervision to demonstrate improved balance and functional mobility    Baseline CGA - Min A    Time 8    Period Weeks    Status New      PT LONG TERM GOAL #4   Title  Patient will demo ability to ambulate > 400 ft w/ LRAD versus hand held assist min assist to demonstrate improved household mobility and short community distances.    Baseline TBA  Time 8    Period Weeks    Status Revised                 Plan - 12/31/19 1653    Clinical Impression Statement Today's skilled PT session included continued gait training with +2 HHA, working towards improved control and weight shift with ambulation. Patient continue to require Min A with transfers due to decreased balance and ataxia. Continue to demo imprvements with foot placement. Will continue per POC.    Personal Factors and Comorbidities Comorbidity 1;Behavior Pattern;Transportation    Comorbidities HTN    Examination-Activity Limitations Bed Mobility;Dressing;Hygiene/Grooming;Caring for Others;Stairs;Stand;Toileting;Transfers;Sit;Bathing    Examination-Participation Restrictions Occupation;Community Activity;Medication Management;Cleaning    Stability/Clinical Decision Making Evolving/Moderate complexity    Rehab Potential Good    PT Frequency 2x / week    PT Duration 8 weeks    PT Treatment/Interventions ADLs/Self Care Home Management;Aquatic Therapy;Electrical Stimulation;DME Instruction;Gait training;Stair training;Functional mobility training;Therapeutic activities;Therapeutic exercise;Balance training;Neuromuscular re-education;Wheelchair mobility training;Patient/family education;Orthotic Fit/Training;Manual techniques;Passive range of motion    PT Next Visit Plan Check STGs. Continue to work on seated and standing balance, BLE strengthening, coordination activities, core strengthening. Gait training with hand held assist of 2 people.    Consulted and Agree with Plan of Care Patient;Family member/caregiver    Family Member Consulted Mom/Dad           Patient will benefit from skilled therapeutic intervention in order to improve the following deficits and impairments:  Abnormal gait,  Decreased balance, Decreased endurance, Decreased mobility, Difficulty walking, Impaired tone, Impaired sensation, Pain, Impaired UE functional use, Decreased strength, Decreased safety awareness, Decreased knowledge of use of DME, Decreased coordination, Decreased activity tolerance, Decreased cognition, Decreased range of motion  Visit Diagnosis: Other abnormalities of gait and mobility  Muscle weakness (generalized)  Unsteadiness on feet  Other lack of coordination     Problem List Patient Active Problem List   Diagnosis Date Noted  . Coronary artery disease involving native coronary artery of native heart without angina pectoris 12/12/2019  . Ischemic cardiomyopathy 12/12/2019  . Brain injury (HCC)   . Prediabetes   . Anoxic brain injury (HCC) 11/05/2019  . Dysphagia 11/05/2019  . Physical deconditioning 11/05/2019  . Acute delirium 11/05/2019  . Respiratory failure (HCC)   . Acute metabolic encephalopathy   . Encounter for central line placement   . Ventricular fibrillation (HCC) 09/23/2019  . Acute combined systolic and diastolic heart failure (HCC) 09/23/2019  . Ventricular tachycardia, polymorphic (HCC) 09/23/2019  . Pelvic pain affecting pregnancy 10/15/2015  . Supervision of normal pregnancy in third trimester 09/28/2015  . Poor weight gain of pregnancy 09/28/2015  . Iron deficiency anemia of pregnancy 09/01/2015  . Increased BMI (body mass index) 07/29/2015    Tempie Donning, PT, DPT 12/31/2019, 4:56 PM  [facility] [address] [place], [place], [address] Phone: [phone]   Fax:  [phone]  Name: Beverly [name] MRN: [id number] Date of Birth: 12-30-1985

## 2020-01-06 ENCOUNTER — Ambulatory Visit: Payer: Medicaid Other | Admitting: Diagnostic Neuroimaging

## 2020-01-06 ENCOUNTER — Other Ambulatory Visit: Payer: Self-pay

## 2020-01-06 ENCOUNTER — Encounter: Payer: Self-pay | Admitting: Diagnostic Neuroimaging

## 2020-01-06 VITALS — BP 109/71 | HR 63 | Ht 69.0 in | Wt 181.0 lb

## 2020-01-06 DIAGNOSIS — G931 Anoxic brain damage, not elsewhere classified: Secondary | ICD-10-CM

## 2020-01-06 NOTE — Progress Notes (Signed)
[facility]  PATIENT: Beverly [name] DOB: May 15, 1985  REFERRING CLINICIAN: [facility], [name]* HISTORY FROM: Patient and patient's mother and chart review REASON FOR VISIT: New consult   HISTORICAL  CHIEF COMPLAINT:  Chief Complaint  Patient presents with  . Anoxic brain injury    rm 7 New Pt, mom-Beverly [name]    HISTORY OF PRESENT ILLNESS:   34 year old female here for evaluation of anoxic brain injury.  Patient was 2 weeks postpartum and then developed chest pain and came to the emergency room.  She sustained V. fib arrest and was found to have aneurysmal dilation/dissection of the LAD.  Left ventricular ejection fraction was 30%.  Patient had clinical signs of anoxic brain injury.  Patient had prolonged hospital course from 09/24/2019 until 11/28/2019.  Patient was significant encephalopathic in the hospital.  She was having impulsivity, gait difficulty, dysphagia.  She was discharged with a feeding tube.  Patient now living at home with her mother and 3 children.  Over time patient has been able to regain some language function.  She is able to communicate in simple phrases and sentences, follow commands.  Her memory and cognitive function is severely impaired.  She has some agitation late in the evening.  She was started on oxcarbazepine in the hospital for mood stabilization but this does not seem to be working.   REVIEW OF SYSTEMS: Full 14 system review of systems performed and negative with exception of: As per HPI.  ALLERGIES: No Known Allergies  HOME MEDICATIONS: Outpatient Medications Prior to Visit  Medication Sig Dispense Refill  . acetaminophen (TYLENOL) 160 MG/5ML solution Place 20.3 mLs (650 mg total) into feeding tube every 4 (four) hours as needed for moderate pain, headache or fever. 120 mL 0  . aspirin 81 MG chewable tablet Place 1 tablet (81 mg total) into feeding tube daily. 30 tablet 3  . atorvastatin (LIPITOR) 80 MG tablet Place 1  tablet (80 mg total) into feeding tube daily. 30 tablet 3  . carvedilol (COREG) 6.25 MG tablet Place 1 tablet (6.25 mg total) into feeding tube 2 (two) times daily with a meal. 60 tablet 3  . digoxin (LANOXIN) 0.125 MG tablet Place 1 tablet (0.125 mg total) into feeding tube daily. 30 tablet 3  . feeding supplement (ENSURE ENLIVE / ENSURE PLUS) LIQD Take 237 mLs by mouth 2 (two) times daily between meals. 237 mL 12  . melatonin 3 MG TABS tablet Place 1 tablet (3 mg total) into feeding tube at bedtime as needed (insomia/nocturnal agitation).  0  . metaxalone (SKELAXIN) 400 MG tablet Place 1 tablet (400 mg total) into feeding tube at bedtime. 30 tablet 3  . OXcarbazepine (TRILEPTAL) 150 MG tablet Place 1 tablet (150 mg total) into feeding tube at bedtime. 30 tablet 3  . OXcarbazepine (TRILEPTAL) 150 MG tablet Place 0.5 tablets (75 mg total) into feeding tube every morning. 30 tablet 3  . pantoprazole (PROTONIX) 40 MG tablet Take 1 tablet (40 mg total) by mouth daily. 30 tablet 3  . Prenatal Vit-Fe Fumarate-FA (PRENATAL MULTIVITAMIN) TABS tablet Place 1 tablet into feeding tube daily at 12 noon. 30 tablet 3  . sacubitril-valsartan (ENTRESTO) 24-26 MG Take 1 tablet by mouth 2 (two) times daily. 60 tablet 3  . spironolactone (ALDACTONE) 25 MG tablet Place 1 tablet (25 mg total) into feeding tube daily. 30 tablet 3  . Water For Irrigation, Sterile (FREE WATER) SOLN Place 200 mLs into feeding tube every 6 (six) hours.  Facility-Administered Medications Prior to Visit  Medication Dose Route Frequency Provider Last Rate Last Admin  . iron polysaccharides (NIFEREX) capsule 150 mg  150 mg Oral Daily [name], MD        PAST MEDICAL HISTORY: Past Medical History:  Diagnosis Date  . Brain injury (HCC)   . Hypertension    last pregnancy  . Ischemic cardiomyopathy   . STEMI (ST elevation myocardial infarction) (HCC) 09/2019   SCAD with aneurysmal dilation of proximal LAD.  . Vaginal Pap smear,  abnormal    when she was 34yo  . Ventricular fibrillation (HCC) 09/23/2019    PAST SURGICAL HISTORY: Past Surgical History:  Procedure Laterality Date  . IR GASTROSTOMY TUBE MOD SED  10/07/2019  . LEFT HEART CATH AND CORONARY ANGIOGRAPHY N/A 09/23/2019   Procedure: LEFT HEART CATH AND CORONARY ANGIOGRAPHY;  Surgeon: Yvonne Kendall, MD;  Location: [facility] INVASIVE CV LAB;  Service: Cardiovascular;  Laterality: N/A;    FAMILY HISTORY: Family History  Problem Relation Age of Onset  . Hyperlipidemia Mother   . Hypertension Mother   . Diabetes Maternal Grandmother   . Seizures Maternal Grandmother   . Thyroid disease Maternal Grandmother   . Diabetes Paternal Grandmother   . Rashes / Skin problems Son        ezcema  . Seizures Son     SOCIAL HISTORY: Social History   Socioeconomic History  . Marital status: Single    Spouse name: Not on file  . Number of children: 3  . Years of education: Not on file  . Highest education level: Not on file  Occupational History  . Occupation: oncology    Employer: [employer]    Comment: NA  Tobacco Use  . Smoking status: Never Smoker  . Smokeless tobacco: Never Used  Vaping Use  . Vaping Use: Never used  Substance and Sexual Activity  . Alcohol use: Not Currently    Alcohol/week: 0.0 standard drinks    Comment: quit 2017  . Drug use: No    Frequency: 5.0 times per week    Types: Marijuana    Comment: stopped when she found out she was pregnant  . Sexual activity: Not Currently    Partners: Male    Birth control/protection: None  Other Topics Concern  . Not on file  Social History Narrative   01/06/20 lives with mom and her children   Social Determinants of Health   Financial Resource Strain:   . Difficulty of Paying Living Expenses: Not on file  Food Insecurity:   . Worried About Programme researcher, broadcasting/film/video in the Last Year: Not on file  . Ran Out of Food in the Last Year: Not on file  Transportation Needs:   . Lack of  Transportation (Medical): Not on file  . Lack of Transportation (Non-Medical): Not on file  Physical Activity:   . Days of Exercise per Week: Not on file  . Minutes of Exercise per Session: Not on file  Stress:   . Feeling of Stress : Not on file  Social Connections:   . Frequency of Communication with Friends and Family: Not on file  . Frequency of Social Gatherings with Friends and Family: Not on file  . Attends Religious Services: Not on file  . Active Member of Clubs or Organizations: Not on file  . Attends Banker Meetings: Not on file  . Marital Status: Not on file  Intimate Partner Violence:   . Fear of Current or  Ex-Partner: Not on file  . Emotionally Abused: Not on file  . Physically Abused: Not on file  . Sexually Abused: Not on file     PHYSICAL EXAM  GENERAL EXAM/CONSTITUTIONAL: Vitals:  Vitals:   01/06/20 1525  BP: 109/71  Pulse: 63  Weight: 181 lb (82.1 kg)  Height: 5\' 9"  (1.753 m)     Body mass index is 26.73 kg/m. Wt Readings from Last 3 Encounters:  01/06/20 181 lb (82.1 kg)  12/11/19 189 lb (85.7 kg)  12/03/19 187 lb 6.3 oz (85 kg)     Patient is in no distress; well developed, nourished and groomed; neck is supple  CARDIOVASCULAR:  Examination of carotid arteries is normal; no carotid bruits  Regular rate and rhythm, no murmurs  Examination of peripheral vascular system by observation and palpation is normal  EYES:  Ophthalmoscopic exam of optic discs and posterior segments is normal; no papilledema or hemorrhages  No exam data present  MUSCULOSKELETAL:  Gait, strength, tone, movements noted in Neurologic exam below  NEUROLOGIC: MENTAL STATUS:  No flowsheet data found.  awake, alert, oriented to person and "[place]" and "2021"  DECR memory   DECR attention and concentration  DECR FLUENCY; FOLLOWS COMMANDS; ABLE TO NAME SOME OBJECTS  fund of knowledge appropriate  CRANIAL NERVE:   2nd - no papilledema on  fundoscopic exam  2nd, 3rd, 4th, 6th - pupils equal and reactive to light, visual fields full to confrontation, extraocular muscles intact, no nystagmus  5th - facial sensation symmetric  7th - facial strength symmetric  8th - hearing intact  9th - palate elevates symmetrically, uvula midline  11th - shoulder shrug symmetric  12th - tongue protrusion midline  MOTOR:   normal bulk; increased tone in BUE and BLE; MOVES BUE AND BLE SYMM; BUE 3-4; BLE 3  SENSORY:   normal and symmetric to light touch, temperature, vibration  COORDINATION:   finger-nose-finger, fine finger movements SLOW  REFLEXES:   deep tendon reflexes 1+ and symmetric  GAIT/STATION:   IN WHEEL CHAIR     DIAGNOSTIC DATA (LABS, IMAGING, TESTING) - I reviewed patient records, labs, notes, testing and imaging myself where available.  Lab Results  Component Value Date   WBC 7.4 11/23/2019   HGB 11.0 (L) 11/23/2019   HCT 35.7 (L) 11/23/2019   MCV 82.1 11/23/2019   PLT 616 (H) 11/23/2019      Component Value Date/Time   NA 140 12/11/2019 1455   K 3.9 12/11/2019 1455   CL 104 12/11/2019 1455   CO2 22 12/11/2019 1455   GLUCOSE 104 (H) 12/11/2019 1455   GLUCOSE 93 11/28/2019 0622   BUN 7 12/11/2019 1455   CREATININE 0.53 (L) 12/11/2019 1455   CALCIUM 9.6 12/11/2019 1455   PROT 7.9 11/12/2019 2205   ALBUMIN 3.5 11/12/2019 2205   AST 31 11/12/2019 2205   ALT 35 11/12/2019 2205   ALKPHOS 104 11/12/2019 2205   BILITOT 0.2 (L) 11/12/2019 2205   GFRNONAA 126 12/11/2019 1455   GFRNONAA >60 11/28/2019 0622   GFRAA 145 12/11/2019 1455   Lab Results  Component Value Date   CHOL 220 (H) 09/26/2019   HDL 48 09/26/2019   LDLCALC 122 (H) 09/26/2019   TRIG 115 10/04/2019   CHOLHDL 4.6 09/26/2019   Lab Results  Component Value Date   HGBA1C 5.7 (H) 09/24/2019   Lab Results  Component Value Date   VITAMINB12 2,194 (H) 11/23/2019   Lab Results  Component Value Date  TSH 0.694 11/23/2019      10/07/19 MRI brain  - No imaging evidence of anoxic brain injury. - Normal MRI of the brain.   ASSESSMENT AND PLAN  34 y.o. year old female here with:   Dx:  1. Anoxic brain injury (HCC)      PLAN:  ANOXIC BRAIN INJURY (s/p vfib arrest due to LAD dissection; Aug 2021) - continue supportive care - refer to PM&R (Dr. Riley KillSwartz) at request of patient's mother (who used to be a nurse tech with [employer] in the past); eval and treat  MOOD LABILITY / AGITATION - wean off oxcarbazepine (reduce to 1 tab at bedtime x 1-2 weeks, then stop) --> not effective - consider lamotrigine or quetiapine in future  Orders Placed This Encounter  Procedures  . Ambulatory referral to Physical Medicine Rehab   Return for pending if symptoms worsen or fail to improve, return to PCP.    Suanne MarkerVIKRAM R. Zaniel Marineau, MD 01/06/2020, 4:10 PM Certified in Neurology, Neurophysiology and Neuroimaging  [facility] [address], [address] [place], [place] [address] [phone]

## 2020-01-06 NOTE — Patient Instructions (Signed)
ANOXIC BRAIN INJURY (s/p vfib arrest due to LAD dissection; Aug 2021) - continue supportive care - refer to PM&R (Dr. Riley Kill)  MOOD LABILITY / AGITATION - wean off oxcarbazepine (reduce to 1 tab at bedtime x 1-2 weeks, then stop) --> not effective - consider lamotrigine or quetiapine in future

## 2020-01-07 ENCOUNTER — Ambulatory Visit: Payer: Medicaid Other | Attending: Student | Admitting: Occupational Therapy

## 2020-01-07 ENCOUNTER — Encounter: Payer: Self-pay | Admitting: Occupational Therapy

## 2020-01-07 ENCOUNTER — Ambulatory Visit: Payer: Medicaid Other

## 2020-01-07 ENCOUNTER — Other Ambulatory Visit: Payer: Self-pay

## 2020-01-07 DIAGNOSIS — M6281 Muscle weakness (generalized): Secondary | ICD-10-CM | POA: Diagnosis not present

## 2020-01-07 DIAGNOSIS — R2689 Other abnormalities of gait and mobility: Secondary | ICD-10-CM | POA: Diagnosis present

## 2020-01-07 DIAGNOSIS — R41840 Attention and concentration deficit: Secondary | ICD-10-CM | POA: Diagnosis present

## 2020-01-07 DIAGNOSIS — R41841 Cognitive communication deficit: Secondary | ICD-10-CM | POA: Diagnosis present

## 2020-01-07 DIAGNOSIS — R278 Other lack of coordination: Secondary | ICD-10-CM

## 2020-01-07 DIAGNOSIS — R2681 Unsteadiness on feet: Secondary | ICD-10-CM | POA: Insufficient documentation

## 2020-01-07 DIAGNOSIS — R262 Difficulty in walking, not elsewhere classified: Secondary | ICD-10-CM

## 2020-01-07 DIAGNOSIS — R41844 Frontal lobe and executive function deficit: Secondary | ICD-10-CM

## 2020-01-07 NOTE — Therapy (Signed)
[facility] [address] [place], [place], [address] Phone: [phone]   Fax:  [phone]  Physical Therapy Treatment  Patient Details  Name: Beverly [name] MRN: [id number] Date of Birth: June 04, 1985 Referring Provider (PT): Mercy Riding, MD   Encounter Date: 01/07/2020   PT End of Session - 01/07/20 1317    Visit Number 7    Number of Visits 17    Date for PT Re-Evaluation 03/09/20   POC for 8 weeks, Cert for 90 days   Authorization Type Medicaid OON; Self Pay    PT Start Time 1315    PT Stop Time 1359    PT Time Calculation (min) 44 min    Equipment Utilized During Treatment Gait belt    Activity Tolerance Patient tolerated treatment well    Behavior During Therapy Impulsive           Past Medical History:  Diagnosis Date  . Brain injury ([place])   . Hypertension    last pregnancy  . Ischemic cardiomyopathy   . STEMI (ST elevation myocardial infarction) ([place]) 09/2019   SCAD with aneurysmal dilation of proximal LAD.  . Vaginal Pap smear, abnormal    when she was 34yo  . Ventricular fibrillation ([place]) 09/23/2019    Past Surgical History:  Procedure Laterality Date  . IR GASTROSTOMY TUBE MOD SED  10/07/2019  . LEFT HEART CATH AND CORONARY ANGIOGRAPHY N/A 09/23/2019   Procedure: LEFT HEART CATH AND CORONARY ANGIOGRAPHY;  Surgeon: Nelva Bush, MD;  Location: [facility];  Service: Cardiovascular;  Laterality: N/A;    There were no vitals filed for this visit.   Subjective Assessment - 01/07/20 1318    Subjective Patient reports no changes/complaints. Reports no falls. No pain to report.    Patient is accompained by: Family member   mom   Pertinent History HTN    Limitations Sitting;Standing;Walking;House hold activities    Patient Stated Goals walk better    Currently in Pain? No/denies                             [facility] Adult PT Treatment/Exercise - 01/07/20 0001        Transfers   Transfers Sit to Stand;Stand to Sit;Stand Pivot Transfers    Sit to Stand 4: Min assist    Sit to Stand Details Tactile cues for placement;Verbal cues for technique    Sit to Stand Details (indicate cue type and reason) continue to demonstrate increased extension with BLE, and increased balance challenge upon standing requiring Min A from PT    Stand to Sit 4: Min guard;4: Min assist    Stand to Sit Details (indicate cue type and reason) Verbal cues for technique    Stand to Sit Details continue to provide verbal/tactile cues for hand placement for improved control    Stand Pivot Transfers 4: Min assist;4: Min guard    Stand Pivot Transfer Details (indicate cue type and reason) completed transfer from w/c <> mat with Min A for balance.       Ambulation/Gait   Ambulation/Gait Yes    Ambulation/Gait Assistance 4: Min guard;4: Min assist    Ambulation/Gait Assistance Details Completed ambulation in // bars, initially completing with BUE support, progressed to no UE support and Min A from PT. Min A required to maintain balance and increase verbal/tactile cues as well as manual faciliation for improved coordination and sequencing. Initally very  jerky movements noted.     Ambulation Distance (Feet) 10 Feet   x 6   Assistive device Parallel bars    Gait Pattern Step-through pattern;Decreased step length - right;Decreased step length - left;Ataxic    Ambulation Surface Level;Indoor      Neuro Re-ed    Neuro Re-ed Details  Completed standing balance activites in // bars: completed standing with alternate shoulder flexion x 10 reps withotu UE support, then worked to completed reaches laterally to R/L x 10 reps each direction. CGA required for balance. Seated on green air disc: completed reaching to PT hand in various directions working on improved limits of stability and seated balance, completed x 5 minutes. Completed alternating marching x 10 reps bilaterally, and alteranting LAQ x 10  reps, working toward completing without UE support. PT providing manual faciliation for improved weight shift and posture completion.       Exercises   Exercises Other Exercises    Other Exercises  PT reviewed HEP with patient and mom to ensure completion. Mom reporting feeling overwhelemed with everything going on, PT educating on technique to promote compliance, and reporting to attempt to complete 3x/week. PT also educating patient on importance of completion, as mom reports patient does not perform as well for her at home.             Access Code: [email or web address] URL: [email or web address] Date: 12/23/2019 Prepared by: Willow Ora  Exercises Bridge - 1 x daily - 5 x weekly - 1 sets - 10 reps Supine March with Resistance Band - 1 x daily - 5 x weekly - 1 sets - 10 reps Hooklying Single Leg Bent Knee Fallouts with Resistance - 1 x daily - 5 x weekly - 1 sets - 10 reps Sit to Stand with Counter Support - 1 x daily - 5 x weekly - 1 sets - 10 reps       PT Education - 01/07/20 1503    Education Details Progress toward STG; HEP compliance techniques    Person(s) Educated Patient;Parent(s)    Methods Explanation    Comprehension Verbalized understanding            PT Short Term Goals - 01/07/20 1319      PT SHORT TERM GOAL #1   Title Patient/caregiver will report independence with HEP with caregiver assistance (All STGs Due: 01/07/2020)    Baseline reports independence; only completing 1-2x/week per mom reports    Time 4    Period Weeks    Status Partially Met    Target Date 01/07/20      PT SHORT TERM GOAL #2   Title Patient will demo ability to complete transfer from w/c <> mat with CGA with LRAD to demonstrate improved functional mobility    Baseline intermittent CGA- Min A    Time 4    Period Weeks    Status Partially Met    Target Date 01/07/20      PT SHORT TERM GOAL #3   Title Patient will demo ability to ambulate >/= 200 ft  with LRAD or hand hand  assist with moderate assistance to demonstrate improved functional mobility in home.    Baseline 115 ft max assist +2 hand held assist.    Time 4    Period Weeks    Status On-going             PT Long Term Goals - 12/18/19 0840      PT LONG TERM GOAL #1  Title Patient/caregiver will report indepence with final HEP with caregiver assistance (All LTGS: 02/04/2020)    Baseline no HEP established    Time 8    Period Weeks    Status New      PT LONG TERM GOAL #2   Title Patient will demo ability to complete all bed mobility Mod I to demonstrate improved independence    Baseline CGA/Supervision    Time 8    Period Weeks    Status New      PT LONG TERM GOAL #3   Title Patient will demo ability to complete sit <> stand x 5 reps with supervision to demonstrate improved balance and functional mobility    Baseline CGA - Min A    Time 8    Period Weeks    Status New      PT LONG TERM GOAL #4   Title Patient will demo ability to ambulate > 400 ft w/ LRAD versus hand held assist min assist to demonstrate improved household mobility and short community distances.    Baseline TBA    Time 8    Period Weeks    Status Revised                 Plan - 01/07/20 1506    Clinical Impression Statement Today's skilled PT session included assesment of patient's progress toward STG. Patient able to partially meet STG #1 and #2, demonstrating improvements with functional transfers. Patient continues to demo increased impulsivity and ataxia leading to increased balance challenge with ambulation at this time. PT educating mom and patient on importance of compliance with HEP. Will continue to progress toward all unmet LTGs.    Personal Factors and Comorbidities Comorbidity 1;Behavior Pattern;Transportation    Comorbidities HTN    Examination-Activity Limitations Bed Mobility;Dressing;Hygiene/Grooming;Caring for Others;Stairs;Stand;Toileting;Transfers;Sit;Bathing    Examination-Participation  Restrictions Occupation;Community Activity;Medication Management;Cleaning    Stability/Clinical Decision Making Evolving/Moderate complexity    Rehab Potential Good    PT Frequency 2x / week    PT Duration 8 weeks    PT Treatment/Interventions ADLs/Self Care Home Management;Aquatic Therapy;Electrical Stimulation;DME Instruction;Gait training;Stair training;Functional mobility training;Therapeutic activities;Therapeutic exercise;Balance training;Neuromuscular re-education;Wheelchair mobility training;Patient/family education;Orthotic Fit/Training;Manual techniques;Passive range of motion    PT Next Visit Plan Continue to work on seated and standing balance, BLE strengthening, coordination activities, core strengthening. Gait training with hand held assist of 2 people.    Consulted and Agree with Plan of Care Patient;Family member/caregiver    Family Member Consulted Mom/Dad           Patient will benefit from skilled therapeutic intervention in order to improve the following deficits and impairments:  Abnormal gait, Decreased balance, Decreased endurance, Decreased mobility, Difficulty walking, Impaired tone, Impaired sensation, Pain, Impaired UE functional use, Decreased strength, Decreased safety awareness, Decreased knowledge of use of DME, Decreased coordination, Decreased activity tolerance, Decreased cognition, Decreased range of motion  Visit Diagnosis: Other abnormalities of gait and mobility  Muscle weakness (generalized)  Unsteadiness on feet  Difficulty in walking, not elsewhere classified  Other lack of coordination     Problem List Patient Active Problem List   Diagnosis Date Noted  . Coronary artery disease involving native coronary artery of native heart without angina pectoris 12/12/2019  . Ischemic cardiomyopathy 12/12/2019  . Brain injury ([place])   . Prediabetes   . Anoxic brain injury ([place]) 11/05/2019  . Dysphagia 11/05/2019  . Physical deconditioning 11/05/2019   . Acute delirium 11/05/2019  . Respiratory failure ([place])   .  Acute metabolic encephalopathy   . Encounter for central line placement   . Ventricular fibrillation ([place]) 09/23/2019  . Acute combined systolic and diastolic heart failure ([place]) 09/23/2019  . Ventricular tachycardia, polymorphic ([place]) 09/23/2019  . Pelvic pain affecting pregnancy 10/15/2015  . Supervision of normal pregnancy in third trimester 09/28/2015  . Poor weight gain of pregnancy 09/28/2015  . Iron deficiency anemia of pregnancy 09/01/2015  . Increased BMI (body mass index) 07/29/2015    Jones Bales, PT, DPT 01/07/2020, 3:08 PM  [facility] [address] [place], [place], [address] Phone: [phone]   Fax:  [phone]  Name: Beverly [name] MRN: [id number] Date of Birth: 1985-08-24

## 2020-01-07 NOTE — Therapy (Signed)
[facility] [address] [place], [place], [address] Phone: [phone]   Fax:  [phone]  Occupational Therapy Treatment  Patient Details  Name: Beverly [name] MRN: [id number] Date of Birth: December 01, 1985 Referring Provider (OT): Candelaria Stagers MD   Encounter Date: 01/07/2020   OT End of Session - 01/07/20 1447    Visit Number 4    Number of Visits 24    Date for OT Re-Evaluation 03/16/20    Authorization Type Medicaid OON    OT Start Time 1233    OT Stop Time 1315    OT Time Calculation (min) 42 min    Activity Tolerance Patient tolerated treatment well    Behavior During Therapy Impulsive           Past Medical History:  Diagnosis Date  . Brain injury (HCC)   . Hypertension    last pregnancy  . Ischemic cardiomyopathy   . STEMI (ST elevation myocardial infarction) (HCC) 09/2019   SCAD with aneurysmal dilation of proximal LAD.  . Vaginal Pap smear, abnormal    when she was 34yo  . Ventricular fibrillation (HCC) 09/23/2019    Past Surgical History:  Procedure Laterality Date  . IR GASTROSTOMY TUBE MOD SED  10/07/2019  . LEFT HEART CATH AND CORONARY ANGIOGRAPHY N/A 09/23/2019   Procedure: LEFT HEART CATH AND CORONARY ANGIOGRAPHY;  Surgeon: Yvonne Kendall, MD;  Location: [facility] INVASIVE CV LAB;  Service: Cardiovascular;  Laterality: N/A;    There were no vitals filed for this visit.   Subjective Assessment - 01/07/20 1233    Subjective  Pt denies any changes or any pain.    Patient is accompanied by: Family member    Pertinent History On 09/23/19 patient presented to ED 2 weeks postpartum (had emergent C-Section) with chest pain. In ED sustained VFib Cardiac Arrest requiring intubation. EKG showed ST elevation, Cath Lab showed aneurysmal dissection of the LAD was identified. Course complicated by encephalopathy, anoxic brain injury, enterobacterial PNAPMH: HTN    Limitations PEG tube, dysphagia, impulsive     Patient Stated Goals to return to prior function as able    Currently in Pain? No/denies             Treatment: Pt accompanied by mother this day.   1 inch blocks - first functional grasp/release with LUE for placing in bowl. Alternating pattern (red, blue) with 3 errors with BUE and putting in bucket. Sorting all blocks by color with assistance required for breaking down per color and reminders approximately 50% of the time for recall of colors and checking for all blocks.   Pt with perseveration saying "I used to work here" throughout session. [name] reports patient used to work at [employer] and [employer] system and in a rehab but not at this facility.   Pt completed semi circular peg board with increased time and moderate drops secondary to coordination.   Splints were assessed but deferred to [facility] upon transfer for remaining plan of care starting next week.   Pt consistently reporting she has 2 children but recalls 3rd (newborn) daughter with cueing.   Pt sequencing dressing this day with 1 error and 1-2 verbal cues for remembering shoes. Pt and pt mother report pt is completing UB and LB Dressing (without shoes and socks) with set up at this time and brushing teeth with min verbal cues.                OT Short Term  Goals - 01/07/20 1308      OT SHORT TERM GOAL #1   Title Pt/ caregiver will be I with inital HEP.    Baseline dependent    Time 6    Period Weeks    Status On-going    Target Date 02/03/20      OT SHORT TERM GOAL #2   Title Pt will perform UB dressing consistently with supervision/ set up and min v.c    Baseline min A    Time 6    Period Weeks    Status Achieved      OT SHORT TERM GOAL #3   Title Pt will demonstrate sustained attention to a simple functional task for 2 mins, in a minimally distracting environment.    Baseline 1 min with re-direction    Time 6    Period Weeks    Status Achieved      OT SHORT TERM GOAL #4   Title Pt  will wash her face and brush her teeth with no more than min v.c for thoroughness    Baseline mod A    Time 6    Period Weeks    Status On-going      OT SHORT TERM GOAL #5   Title Pt will demonstrate improved bilateral functional use as evidenced by increasing box/blocks score by 3 blocks    Baseline RUE10 blocks, LUE 12 blocks    Time 6    Period Weeks    Status On-going      OT SHORT TERM GOAL #6   Title Pt will demonstrate ability to sequence a simple functional or ADL task with no more than min v.c    Baseline Pt requires grossly mod A with sequencing simple activities    Time 6    Period Weeks    Status On-going             OT Long Term Goals - 12/31/19 1713      OT LONG TERM GOAL #1   Title Pt will perform LB bathing and dressing with no more than min A.    Baseline max A    Time 12    Period Weeks    Status On-going      OT LONG TERM GOAL #2   Title Pt will demonstrate improved bilateral UE functional use by increasing box/ blocks score by 6 blocks from inital measurement.    Baseline RUE 10 blocks, LUE 12 blocks    Time 12    Period Weeks    Status On-going      OT LONG TERM GOAL #3   Title Pt will perform simple cold snack prep with min A.    Baseline dependent    Time 12    Period Weeks    Status On-going      OT LONG TERM GOAL #4   Title Pt will attend to an ADL/ simple functional familiar task for 10 mins.    Baseline attends for grossly 1 min with re-direct.    Time 12    Period Weeks    Status On-going      OT LONG TERM GOAL #5   Title Pt perform a simple home management task with no more than min A, demonstrating good safety awareness.    Baseline dependent    Time 12    Period Weeks    Status On-going      OT LONG TERM GOAL #6   Title Pt will write  her name with 75% legibility    Baseline handwriting is illegibile    Time 12    Period Weeks    Status On-going                 Plan - 01/07/20 1447    Clinical Impression  Statement Pt with increased participation and carryover of ADLs at home.    OT Occupational Profile and History Detailed Assessment- Review of Records and additional review of physical, cognitive, psychosocial history related to current functional performance    Occupational performance deficits (Please refer to evaluation for details): ADL's;IADL's;Work;Leisure;Social Participation    Body Structure / Function / Physical Skills ADL;Endurance;UE functional use;Balance;Pain;Flexibility;FMC;ROM;GMC;Coordination;Decreased knowledge of precautions;Decreased knowledge of use of DME;IADL;Dexterity;Strength;Mobility;Tone;Vision;Sensation;Gait    Cognitive Skills Attention;Energy/Drive;Memory;Orientation;Problem Solve;Safety Awareness;Thought;Understand    Rehab Potential Good    Clinical Decision Making Several treatment options, min-mod task modification necessary    Comorbidities Affecting Occupational Performance: May have comorbidities impacting occupational performance   complex medical issues   Modification or Assistance to Complete Evaluation  Min-Moderate modification of tasks or assist with assess necessary to complete eval   Pt impulsive requiring redirects and increased time   OT Frequency 2x / week    OT Duration 12 weeks    OT Treatment/Interventions Self-care/ADL training;Ultrasound;Visual/perceptual remediation/compensation;Patient/family education;Scar mobilization;Aquatic Therapy;Paraffin;Passive range of motion;Balance training;Dry needling;Stair Training;Fluidtherapy;Cryotherapy;Splinting;Moist Heat;Manual Therapy;Therapeutic exercise;Therapeutic activities;Functional Mobility Training;Neuromuscular education;Cognitive remediation/compensation    Plan ADL strategies, sitting balance    Consulted and Agree with Plan of Care Patient;Family member/caregiver           Patient will benefit from skilled therapeutic intervention in order to improve the following deficits and impairments:     Body Structure / Function / Physical Skills: ADL, Endurance, UE functional use, Balance, Pain, Flexibility, FMC, ROM, GMC, Coordination, Decreased knowledge of precautions, Decreased knowledge of use of DME, IADL, Dexterity, Strength, Mobility, Tone, Vision, Sensation, Gait Cognitive Skills: Attention, Energy/Drive, Memory, Orientation, Problem Solve, Safety Awareness, Thought, Understand     Visit Diagnosis: Muscle weakness (generalized)  Other lack of coordination  Frontal lobe and executive function deficit  Attention and concentration deficit    Problem List Patient Active Problem List   Diagnosis Date Noted  . Coronary artery disease involving native coronary artery of native heart without angina pectoris 12/12/2019  . Ischemic cardiomyopathy 12/12/2019  . Brain injury (HCC)   . Prediabetes   . Anoxic brain injury (HCC) 11/05/2019  . Dysphagia 11/05/2019  . Physical deconditioning 11/05/2019  . Acute delirium 11/05/2019  . Respiratory failure (HCC)   . Acute metabolic encephalopathy   . Encounter for central line placement   . Ventricular fibrillation (HCC) 09/23/2019  . Acute combined systolic and diastolic heart failure (HCC) 09/23/2019  . Ventricular tachycardia, polymorphic (HCC) 09/23/2019  . Pelvic pain affecting pregnancy 10/15/2015  . Supervision of normal pregnancy in third trimester 09/28/2015  . Poor weight gain of pregnancy 09/28/2015  . Iron deficiency anemia of pregnancy 09/01/2015  . Increased BMI (body mass index) 07/29/2015    Junious Dresser MOT, OTR/L  01/07/2020, 2:49 PM  [facility] [address] [place], [place], [address] Phone: [phone]   Fax:  [phone]  Name: EBONY YORIO MRN: [id number] Date of Birth: [birth year]

## 2020-01-08 NOTE — Therapy (Signed)
[facility] [address] [place], [place], [address] Phone: [phone]   Fax:  [phone]  Speech Language Pathology Treatment  Patient Details  Name: Beverly [name] MRN: [id number] Date of Birth: [birth year] Referring Provider (SLP): Dr. Candelaria Stagers   Encounter Date: 01/07/2020   End of Session - 01/08/20 0012    Visit Number 3    Number of Visits 25    Date for SLP Re-Evaluation 03/03/20    Authorization Type awaiting medicaid    SLP Start Time 1403    SLP Stop Time  1445    SLP Time Calculation (min) 42 min    Activity Tolerance Patient tolerated treatment well           Past Medical History:  Diagnosis Date  . Brain injury (HCC)   . Hypertension    last pregnancy  . Ischemic cardiomyopathy   . STEMI (ST elevation myocardial infarction) (HCC) 09/2019   SCAD with aneurysmal dilation of proximal LAD.  . Vaginal Pap smear, abnormal    when she was 34yo  . Ventricular fibrillation (HCC) 09/23/2019    Past Surgical History:  Procedure Laterality Date  . IR GASTROSTOMY TUBE MOD SED  10/07/2019  . LEFT HEART CATH AND CORONARY ANGIOGRAPHY N/A 09/23/2019   Procedure: LEFT HEART CATH AND CORONARY ANGIOGRAPHY;  Surgeon: Beverly Kendall, MD;  Location: [facility] INVASIVE CV LAB;  Service: Cardiovascular;  Laterality: N/A;    There were no vitals filed for this visit.   Subjective Assessment - 01/07/20 2356    Subjective Pt smiled at SLP upon entry to ST room with mod dysarthric speech unintelligible to SLP    Patient is accompained by: --   [name]   Currently in Pain? No/denies                 ADULT SLP TREATMENT - 01/07/20 2358      General Information   Behavior/Cognition Alert;Confused;Distractible;Requires cueing;Decreased sustained attention      Treatment Provided   Treatment provided Cognitive-Linquistic      Cognitive-Linquistic Treatment   Treatment focused on Cognition    Skilled  Treatment SLP engaged pt in conversation re: her children -names, ages, birthdays. Pt with correct answers on the first attempt 70% of the time (most difficulty was with third child - 67 months old), at times [name] asked her mother with a doubtul intonation ages of her children when SLP told pt from previous answers 60 seconds prior. SLP then worked with pt to improve her temporal orientation by using calendar as visual aid re: today's full date. After SLP provided max cues for year, month, day and date, SLP wrote it on a piece of paper. On the 5th time SLP asked pt she looked at paper and provided correct answers 60%.Lastly, SLP targeted pt's organization and reasoning along with sustained attention having her sort sequence of 4 cards. Pt req'd consistent max cues for attention to complete task correctly.      Assessment / Recommendations / Plan   Plan Continue with current plan of care   pt to cont ST [facility]     Progression Toward Goals   Progression toward goals Progressing toward goals            SLP Education - 01/08/20 0011    Education Details see pt instructions    Person(s) Educated Patient;Parent(s)    Methods Explanation    Comprehension Verbalized understanding  SLP Short Term Goals - 01/08/20 0022      SLP SHORT TERM GOAL #1   Title Derita will sustain attention for 5 minutes to simple cognitive task with occasional min A over 3 sessions    Baseline 1-2 minutes    Time 5    Period Weeks    Status On-going      SLP SHORT TERM GOAL #2   Title Pt will verbalize 2 safety rules for walking at home with occasional min A over 3 sessions    Baseline pt states she can walk by herself    Time 5    Period Weeks    Status On-going      SLP SHORT TERM GOAL #3   Title Pt will use simple external aid to follow simple schedule and prepare for appointments with usual min A from family    Baseline does not recall or prepare for appointments    Time 5    Period  Weeks    Status On-going      SLP SHORT TERM GOAL #4   Title Pt will use visual and written word cues to wash her face with usual min verbal cues from mom over 3 sessions    Baseline Per mom, pt only touches her face 2-3 times when asked to wash her face    Time 5    Period Weeks    Status On-going            SLP Long Term Goals - 01/08/20 0023      SLP LONG TERM GOAL #1   Title Pt will use external aids/to do list to complete 3 simple chores a day with occasional min A over 3 sessions    Time 11    Period Weeks    Status On-going      SLP LONG TERM GOAL #2   Title Pt will sustain attention for 10 minutes to simple cognitive task with 4 or less re-directions over 3 sessions    Baseline sustains attention for 1-2 minutes    Time 11    Period Weeks    Status On-going      SLP LONG TERM GOAL #3   Title Pt will use visual cues and schedule to eat 2 meals a day and drink 24 oz of liquid with usual min A over 3 sessions    Baseline pt eating less than 1 meal a day and taking only sips (PEG is used)    Time 11    Period Weeks    Status On-going      SLP LONG TERM GOAL #4   Title Pt will use reduced rate to be intellgible in 5 minute conversation with occasional min A    Baseline Rapid rate, rushes of speech, 35% of utterances unintelligible    Time 11    Period Weeks    Status On-going            Plan - 01/08/20 0012    Clinical Impression Statement Beverly [name] is referred for outpt ST after cardiace arrest resulted in anoxic brain injury. She was hopsitalized 09/23/19 to 11/27/19. She is eating approximately 1 meal a day inconsistently and not drinking enough. Parents are using PEG for primary nutrition and hydration, as well as meds.She inconsistently follows commands for grooming per mom. Today, Beverly [name] presents with cont'd severe cognitive linguistic impairments in attention, awareness, memory. Mod dysarthria affecting intelligiblit for SLP today. parents' cues. She  sustains attention for approximately  1-2 minutes. I recommend cont'd skilled ST to maximize cogntion and intelligibility for safety, to reduce caregiver burden.    Speech Therapy Frequency 2x / week    Duration 12 weeks   or 25 visits   Treatment/Interventions Language facilitation;Environmental controls;Cueing hierarchy;SLP instruction and feedback;Aspiration precaution training;Compensatory techniques;Cognitive reorganization;Functional tasks;Compensatory strategies;Diet toleration management by SLP;Trials of upgraded texture/liquids;Internal/external aids;Multimodal communcation approach;Patient/family education    Potential to Achieve Goals Fair    Potential Considerations Severity of impairments;Financial resources           Patient will benefit from skilled therapeutic intervention in order to improve the following deficits and impairments:   Cognitive communication deficit    Problem List Patient Active Problem List   Diagnosis Date Noted  . Coronary artery disease involving native coronary artery of native heart without angina pectoris 12/12/2019  . Ischemic cardiomyopathy 12/12/2019  . Brain injury (HCC)   . Prediabetes   . Anoxic brain injury (HCC) 11/05/2019  . Dysphagia 11/05/2019  . Physical deconditioning 11/05/2019  . Acute delirium 11/05/2019  . Respiratory failure (HCC)   . Acute metabolic encephalopathy   . Encounter for central line placement   . Ventricular fibrillation (HCC) 09/23/2019  . Acute combined systolic and diastolic heart failure (HCC) 09/23/2019  . Ventricular tachycardia, polymorphic (HCC) 09/23/2019  . Pelvic pain affecting pregnancy 10/15/2015  . Supervision of normal pregnancy in third trimester 09/28/2015  . Poor weight gain of pregnancy 09/28/2015  . Iron deficiency anemia of pregnancy 09/01/2015  . Increased BMI (body mass index) 07/29/2015    Beverly [name] ,MS, CCC-SLP  01/08/2020, 12:23 AM  [facility] [address] [place], [place], [address] Phone: [phone]   Fax:  [phone]   Name: Beverly [name] MRN: [id number] Date of Birth: 05-08-1985

## 2020-01-08 NOTE — Patient Instructions (Signed)
Try giving Pa food she likes.  Try to give Beverly [name] bites of food instead of letting her feed herself which may lead to her stuffing her mouth.  If she continues to lose weight you may need to contact Dr.Penumalli or Dr. Kenard Gower.

## 2020-01-09 ENCOUNTER — Ambulatory Visit: Payer: Medicaid Other

## 2020-01-09 ENCOUNTER — Ambulatory Visit (INDEPENDENT_AMBULATORY_CARE_PROVIDER_SITE_OTHER): Payer: Medicaid Other | Admitting: Family

## 2020-01-09 ENCOUNTER — Ambulatory Visit: Payer: Medicaid Other | Admitting: Occupational Therapy

## 2020-01-09 ENCOUNTER — Other Ambulatory Visit: Payer: Self-pay

## 2020-01-09 ENCOUNTER — Encounter: Payer: Self-pay | Admitting: Family

## 2020-01-09 ENCOUNTER — Encounter: Payer: Self-pay | Admitting: Occupational Therapy

## 2020-01-09 VITALS — BP 90/52 | HR 78 | Ht 69.0 in | Wt 180.0 lb

## 2020-01-09 DIAGNOSIS — R278 Other lack of coordination: Secondary | ICD-10-CM

## 2020-01-09 DIAGNOSIS — R2689 Other abnormalities of gait and mobility: Secondary | ICD-10-CM

## 2020-01-09 DIAGNOSIS — I4901 Ventricular fibrillation: Secondary | ICD-10-CM | POA: Diagnosis not present

## 2020-01-09 DIAGNOSIS — I2510 Atherosclerotic heart disease of native coronary artery without angina pectoris: Secondary | ICD-10-CM

## 2020-01-09 DIAGNOSIS — R41844 Frontal lobe and executive function deficit: Secondary | ICD-10-CM

## 2020-01-09 DIAGNOSIS — E785 Hyperlipidemia, unspecified: Secondary | ICD-10-CM

## 2020-01-09 DIAGNOSIS — R2681 Unsteadiness on feet: Secondary | ICD-10-CM

## 2020-01-09 DIAGNOSIS — M6281 Muscle weakness (generalized): Secondary | ICD-10-CM | POA: Diagnosis not present

## 2020-01-09 DIAGNOSIS — I5022 Chronic systolic (congestive) heart failure: Secondary | ICD-10-CM | POA: Diagnosis not present

## 2020-01-09 DIAGNOSIS — I255 Ischemic cardiomyopathy: Secondary | ICD-10-CM | POA: Diagnosis not present

## 2020-01-09 DIAGNOSIS — R41840 Attention and concentration deficit: Secondary | ICD-10-CM

## 2020-01-09 DIAGNOSIS — G931 Anoxic brain damage, not elsewhere classified: Secondary | ICD-10-CM

## 2020-01-09 MED ORDER — ATORVASTATIN CALCIUM 80 MG PO TABS: 80.0000 mg | ORAL_TABLET | Freq: Every day | ORAL | 1 refills | Status: DC

## 2020-01-09 MED ORDER — SPIRONOLACTONE 25 MG PO TABS: 25.0000 mg | ORAL_TABLET | Freq: Every day | ORAL | 1 refills | Status: DC

## 2020-01-09 MED ORDER — LOSARTAN POTASSIUM 25 MG PO TABS: ORAL_TABLET | ORAL | 1 refills | Status: DC

## 2020-01-09 MED ORDER — CARVEDILOL 6.25 MG PO TABS: 6.2500 mg | ORAL_TABLET | Freq: Two times a day (BID) | ORAL | 1 refills | Status: DC

## 2020-01-09 NOTE — Therapy (Signed)
[facility] [address] [place], [place], [address] Phone: [phone]   Fax:  [phone]  Occupational Therapy Treatment  Patient Details  Name: Beverly [name] MRN: [id number] Date of Birth: 1985/09/22 Referring Provider (OT): Candelaria Stagers MD   Encounter Date: 01/09/2020   OT End of Session - 01/09/20 1313    Visit Number 5    Number of Visits 24    Date for OT Re-Evaluation 03/16/20    Authorization Type Medicaid OON    OT Start Time 1232    OT Stop Time 1314    OT Time Calculation (min) 42 min    Activity Tolerance Patient tolerated treatment well    Behavior During Therapy Impulsive           Past Medical History:  Diagnosis Date  . Brain injury (HCC)   . Hypertension    last pregnancy  . Ischemic cardiomyopathy   . STEMI (ST elevation myocardial infarction) (HCC) 09/2019   SCAD with aneurysmal dilation of proximal LAD.  . Vaginal Pap smear, abnormal    when she was 34yo  . Ventricular fibrillation (HCC) 09/23/2019    Past Surgical History:  Procedure Laterality Date  . IR GASTROSTOMY TUBE MOD SED  10/07/2019  . LEFT HEART CATH AND CORONARY ANGIOGRAPHY N/A 09/23/2019   Procedure: LEFT HEART CATH AND CORONARY ANGIOGRAPHY;  Surgeon: Yvonne Kendall, MD;  Location: [facility] INVASIVE CV LAB;  Service: Cardiovascular;  Laterality: N/A;    There were no vitals filed for this visit.   Subjective Assessment - 01/09/20 1236    Subjective  Pt denies any pain. Reports no changes.    Patient is accompanied by: Family member    Pertinent History On 09/23/19 patient presented to ED 2 weeks postpartum (had emergent C-Section) with chest pain. In ED sustained VFib Cardiac Arrest requiring intubation. EKG showed ST elevation, Cath Lab showed aneurysmal dissection of the LAD was identified. Course complicated by encephalopathy, anoxic brain injury, enterobacterial PNAPMH: HTN    Limitations PEG tube, dysphagia, impulsive     Patient Stated Goals to return to prior function as able    Currently in Pain? No/denies             Treatment: Sorted blink cards by shape with increased time and occasional drops and with min cues for attending to all shapes.  Pt with grasp/release and coordination targeted with resistance clothespins with BUE 1-8#. Pt with 2-3 drops and with downgrade to lower pole with highest resistance (black clothespin).  Pt demonstrates swan neck deformity in 5th digit on LUE new since CA. Pt demonstrated ability to make fist but poor active finger isolation in BUE.  Grasp/release with connect 4 and playing game. Pt followed procedure of game and recalled rules appropriately with min cues. Pt required min cues for turn taking and attending to game. Min drops of connect 4 chips with coordination with BUE                     OT Short Term Goals - 01/07/20 1308      OT SHORT TERM GOAL #1   Title Pt/ caregiver will be I with inital HEP.    Baseline dependent    Time 6    Period Weeks    Status On-going    Target Date 02/03/20      OT SHORT TERM GOAL #2   Title Pt will perform UB dressing consistently with supervision/ set up and  min v.c    Baseline min A    Time 6    Period Weeks    Status Achieved      OT SHORT TERM GOAL #3   Title Pt will demonstrate sustained attention to a simple functional task for 2 mins, in a minimally distracting environment.    Baseline 1 min with re-direction    Time 6    Period Weeks    Status Achieved      OT SHORT TERM GOAL #4   Title Pt will wash her face and brush her teeth with no more than min v.c for thoroughness    Baseline mod A    Time 6    Period Weeks    Status On-going      OT SHORT TERM GOAL #5   Title Pt will demonstrate improved bilateral functional use as evidenced by increasing box/blocks score by 3 blocks    Baseline RUE10 blocks, LUE 12 blocks    Time 6    Period Weeks    Status On-going      OT SHORT TERM  GOAL #6   Title Pt will demonstrate ability to sequence a simple functional or ADL task with no more than min v.c    Baseline Pt requires grossly mod A with sequencing simple activities    Time 6    Period Weeks    Status On-going             OT Long Term Goals - 12/31/19 1713      OT LONG TERM GOAL #1   Title Pt will perform LB bathing and dressing with no more than min A.    Baseline max A    Time 12    Period Weeks    Status On-going      OT LONG TERM GOAL #2   Title Pt will demonstrate improved bilateral UE functional use by increasing box/ blocks score by 6 blocks from inital measurement.    Baseline RUE 10 blocks, LUE 12 blocks    Time 12    Period Weeks    Status On-going      OT LONG TERM GOAL #3   Title Pt will perform simple cold snack prep with min A.    Baseline dependent    Time 12    Period Weeks    Status On-going      OT LONG TERM GOAL #4   Title Pt will attend to an ADL/ simple functional familiar task for 10 mins.    Baseline attends for grossly 1 min with re-direct.    Time 12    Period Weeks    Status On-going      OT LONG TERM GOAL #5   Title Pt perform a simple home management task with no more than min A, demonstrating good safety awareness.    Baseline dependent    Time 12    Period Weeks    Status On-going      OT LONG TERM GOAL #6   Title Pt will write her name with 75% legibility    Baseline handwriting is illegibile    Time 12    Period Weeks    Status On-going                 Plan - 01/09/20 1500    Clinical Impression Statement Pt is progressing towards goals. Pt has increased participation at home and completing ADLs. Pt continues to present with poor coordination with  BUE but improving. Pt continues to be impulsive and present with difficulty with attention.    OT Occupational Profile and History Detailed Assessment- Review of Records and additional review of physical, cognitive, psychosocial history related to current  functional performance    Occupational performance deficits (Please refer to evaluation for details): ADL's;IADL's;Work;Leisure;Social Participation    Body Structure / Function / Physical Skills ADL;Endurance;UE functional use;Balance;Pain;Flexibility;FMC;ROM;GMC;Coordination;Decreased knowledge of precautions;Decreased knowledge of use of DME;IADL;Dexterity;Strength;Mobility;Tone;Vision;Sensation;Gait    Cognitive Skills Attention;Energy/Drive;Memory;Orientation;Problem Solve;Safety Awareness;Thought;Understand    Rehab Potential Good    Clinical Decision Making Several treatment options, min-mod task modification necessary    Comorbidities Affecting Occupational Performance: May have comorbidities impacting occupational performance   complex medical issues   Modification or Assistance to Complete Evaluation  Min-Moderate modification of tasks or assist with assess necessary to complete eval   Pt impulsive requiring redirects and increased time   OT Frequency 2x / week    OT Duration 12 weeks    OT Treatment/Interventions Self-care/ADL training;Ultrasound;Visual/perceptual remediation/compensation;Patient/family education;Scar mobilization;Aquatic Therapy;Paraffin;Passive range of motion;Balance training;Dry needling;Stair Training;Fluidtherapy;Cryotherapy;Splinting;Moist Heat;Manual Therapy;Therapeutic exercise;Therapeutic activities;Functional Mobility Training;Neuromuscular education;Cognitive remediation/compensation    Plan ADL strategies, sitting balance and trunk control    Consulted and Agree with Plan of Care Patient;Family member/caregiver           Patient will benefit from skilled therapeutic intervention in order to improve the following deficits and impairments:   Body Structure / Function / Physical Skills: ADL, Endurance, UE functional use, Balance, Pain, Flexibility, FMC, ROM, GMC, Coordination, Decreased knowledge of precautions, Decreased knowledge of use of DME, IADL,  Dexterity, Strength, Mobility, Tone, Vision, Sensation, Gait Cognitive Skills: Attention, Energy/Drive, Memory, Orientation, Problem Solve, Safety Awareness, Thought, Understand     Visit Diagnosis: Other abnormalities of gait and mobility  Muscle weakness (generalized)  Unsteadiness on feet  Other lack of coordination  Frontal lobe and executive function deficit  Attention and concentration deficit    Problem List Patient Active Problem List   Diagnosis Date Noted  . Coronary artery disease involving native coronary artery of native heart without angina pectoris 12/12/2019  . Ischemic cardiomyopathy 12/12/2019  . Brain injury (HCC)   . Prediabetes   . Anoxic brain injury (HCC) 11/05/2019  . Dysphagia 11/05/2019  . Physical deconditioning 11/05/2019  . Acute delirium 11/05/2019  . Respiratory failure (HCC)   . Acute metabolic encephalopathy   . Encounter for central line placement   . Ventricular fibrillation (HCC) 09/23/2019  . Acute combined systolic and diastolic heart failure (HCC) 09/23/2019  . Ventricular tachycardia, polymorphic (HCC) 09/23/2019  . Pelvic pain affecting pregnancy 10/15/2015  . Supervision of normal pregnancy in third trimester 09/28/2015  . Poor weight gain of pregnancy 09/28/2015  . Iron deficiency anemia of pregnancy 09/01/2015  . Increased BMI (body mass index) 07/29/2015    Junious Dresser MOT, OTR/L  01/09/2020, 3:01 PM   [facility] [address] [place], [place], [address] Phone: [phone]   Fax:  [phone]  Name: DELONNA NEY MRN: [id number] Date of Birth: [birth year]

## 2020-01-09 NOTE — Patient Instructions (Addendum)
Medication Instructions:  Your physician has recommended you make the following change in your medication:   STOP Entresto  STOP Digoxin  START Losartan 12.5 mg (half tablet) daily  *If you need a refill on your cardiac medications before your next appointment, please call your pharmacy*    Lab Work: None ordered today. With your next lab work we will plan to check your cholesterol.    Testing/Procedures: Your echocardiogram shows your heart pumping function is back to low normal. This is a great result!   Follow-Up: At [facility], you and your health needs are our priority.  As part of our continuing mission to provide you with exceptional heart care, we have created designated Provider Care Teams.  These Care Teams include your primary Cardiologist (physician) and Advanced Practice Providers (APPs -  Physician Assistants and Nurse Practitioners) who all work together to provide you with the care you need, when you need it.  We recommend signing up for the patient portal called "MyChart".  Sign up information is provided on this After Visit Summary.  MyChart is used to connect with patients for Virtual Visits (Telemedicine).  Patients are able to view lab/test results, encounter notes, upcoming appointments, etc.  Non-urgent messages can be sent to your provider as well.   To learn more about what you can do with MyChart, go to [email or web address].    Your next appointment:   6 weeks  The format for your next appointment:   In Person  Provider:   You may see Yvonne Kendall, MD or one of the following Advanced Practice Providers on your designated Care Team:    Nicolasa Ducking, NP  Eula Listen, PA-C  Marisue Ivan, PA-C  Cadence Selma, [place]  Gillian Shields, NP   Other Instructions  Dr. Riley Kill number: [phone].

## 2020-01-09 NOTE — Therapy (Signed)
[facility] [address] [place], [place], [address] Phone: [phone]   Fax:  [phone]  Physical Therapy Treatment  Patient Details  Name: Beverly [name] MRN: [id number] Date of Birth: 1985-08-30 Referring Provider (PT): Mercy Riding, MD   Encounter Date: 01/09/2020   PT End of Session - 01/09/20 1316    Visit Number 8    Number of Visits 17    Date for PT Re-Evaluation 03/09/20   POC for 8 weeks, Cert for 90 days   Authorization Type Medicaid OON; Self Pay    PT Start Time 1315    PT Stop Time 1358    PT Time Calculation (min) 43 min    Equipment Utilized During Treatment Gait belt    Activity Tolerance Patient tolerated treatment well    Behavior During Therapy Impulsive           Past Medical History:  Diagnosis Date  . Brain injury ([place])   . Hypertension    last pregnancy  . Ischemic cardiomyopathy   . STEMI (ST elevation myocardial infarction) ([place]) 09/2019   SCAD with aneurysmal dilation of proximal LAD.  . Vaginal Pap smear, abnormal    when she was 34yo  . Ventricular fibrillation ([place]) 09/23/2019    Past Surgical History:  Procedure Laterality Date  . IR GASTROSTOMY TUBE MOD SED  10/07/2019  . LEFT HEART CATH AND CORONARY ANGIOGRAPHY N/A 09/23/2019   Procedure: LEFT HEART CATH AND CORONARY ANGIOGRAPHY;  Surgeon: Nelva Bush, MD;  Location: [facility] CV LAB;  Service: Cardiovascular;  Laterality: N/A;    There were no vitals filed for this visit.   Subjective Assessment - 01/09/20 1316    Subjective Pt denies any new issues. Will be transitioning to [facility] for therapy to be closer to home.    Patient is accompained by: Family member   mom   Pertinent History HTN    Limitations Sitting;Standing;Walking;House hold activities    Patient Stated Goals walk better    Currently in Pain? No/denies                             [facility] Adult PT Treatment/Exercise -  01/09/20 1317      Bed Mobility   Bed Mobility Supine to Sit;Sit to Supine    Supine to Sit Contact Guard/Touching assist   pt was cued to roll first next time   Sit to Supine Supervision/Verbal cueing      Transfers   Transfers Sit to Stand;Stand to Sit;Stand Pivot Transfers    Sit to Stand 4: Min assist    Sit to Stand Details Verbal cues for sequencing;Tactile cues for posture;Verbal cues for technique    Sit to Stand Details (indicate cue type and reason) Pt was cued to scoot out some and be sure feet are behind knees some. Mom observing and verbalized understanding as well as pt tends to throw knees back in to extension more when feet are further out. Pt was also cued to push from mat and lean forward but take her time with rising.     Five time sit to stand comments  Pt performed sit to stand x 5 from mat min assist. At times would take a step forward trying to stabilize herself. Does push knees in to mat at times.    Stand to Sit 4: Min guard;4: Min assist    Stand to Sit Details (indicate cue  type and reason) Verbal cues for technique    Stand Pivot Transfers 4: Min assist;4: Min guard    Stand Pivot Transfer Details (indicate cue type and reason) w/c to/from mat      Ambulation/Gait   Ambulation/Gait Yes    Ambulation/Gait Assistance 4: Min assist   with 2 people   Ambulation/Gait Assistance Details PT in front with pt holding to therapist's forearms with BUE first bout with PT stabilizing at pelvis. PTA behind patient also providing trunk stabilization. Both min assist. During 2nd bout 2 therapists in same position but pt only holding arm with right hand and given verbal cues to try to relax left arm at side. Pt was cued throughout gait to slow down and work on light steps and keep tummy tight.     Ambulation Distance (Feet) 115 Feet    x 2   Assistive device 2 person hand held assist    Gait Pattern Step-through pattern;Ataxic    Ambulation Surface Level;Indoor      Neuro Re-ed     Neuro Re-ed Details  Sitting on green disc: reaching for targets x 2 min with verbal cues to tighten tummy to help stabilize, then marching with BUE on mat x 5 each leg then x 5 each leg with only hand held assist on one leg CGA. Pt was challenged with maintaining upright posture. Sit to stand x 5 from green disc min assist. Standing with PT in front with pt holding PT forearms stepping forward and back x 5 each leg mod assist. Pt was challenged with stepping back and less steady with stepping forward with LLE. Standing with min/mod assist: tapping 3 different colored discs on floor on command for color x 3 min. Standing reaching for target across body x 8 each side min assist to stabilize. Verbal cues to take her time with reaching tasks.      Exercises   Exercises Other Exercises    Other Exercises  Supine: bridges x 10 with verbal cues to keep hips in neutral position throughout and take her time, march x 10 each leg with cues to go slowly and not drop foot down.                  PT Education - 01/09/20 1426    Education Details Transfer safety.    Person(s) Educated Patient;Parent(s)    Methods Explanation;Demonstration    Comprehension Verbalized understanding;Returned demonstration            PT Short Term Goals - 01/07/20 1319      PT SHORT TERM GOAL #1   Title Patient/caregiver will report independence with HEP with caregiver assistance (All STGs Due: 01/07/2020)    Baseline reports independence; only completing 1-2x/week per mom reports    Time 4    Period Weeks    Status Partially Met    Target Date 01/07/20      PT SHORT TERM GOAL #2   Title Patient will demo ability to complete transfer from w/c <> mat with CGA with LRAD to demonstrate improved functional mobility    Baseline intermittent CGA- Min A    Time 4    Period Weeks    Status Partially Met    Target Date 01/07/20      PT SHORT TERM GOAL #3   Title Patient will demo ability to ambulate >/= 200 ft   with LRAD or hand hand assist with moderate assistance to demonstrate improved functional mobility in home.  Baseline 115 ft max assist +2 hand held assist.    Time 4    Period Weeks    Status On-going             PT Long Term Goals - 12/18/19 0840      PT LONG TERM GOAL #1   Title Patient/caregiver will report indepence with final HEP with caregiver assistance (All LTGS: 02/04/2020)    Baseline no HEP established    Time 8    Period Weeks    Status New      PT LONG TERM GOAL #2   Title Patient will demo ability to complete all bed mobility Mod I to demonstrate improved independence    Baseline CGA/Supervision    Time 8    Period Weeks    Status New      PT LONG TERM GOAL #3   Title Patient will demo ability to complete sit <> stand x 5 reps with supervision to demonstrate improved balance and functional mobility    Baseline CGA - Min A    Time 8    Period Weeks    Status New      PT LONG TERM GOAL #4   Title Patient will demo ability to ambulate > 400 ft w/ LRAD versus hand held assist min assist to demonstrate improved household mobility and short community distances.    Baseline TBA    Time 8    Period Weeks    Status Revised                 Plan - 01/09/20 1427    Clinical Impression Statement PT continued to work on slow,controlled movements, coordination activities with reciprocal tasks and core stabilizing. Pt was able to demonstrate improved stability with gait even with less UE support during 2nd bout with the added trunk support and slowing down.    Personal Factors and Comorbidities Comorbidity 1;Behavior Pattern;Transportation    Comorbidities HTN    Examination-Activity Limitations Bed Mobility;Dressing;Hygiene/Grooming;Caring for Others;Stairs;Stand;Toileting;Transfers;Sit;Bathing    Examination-Participation Restrictions Occupation;Community Activity;Medication Management;Cleaning    Stability/Clinical Decision Making Evolving/Moderate  complexity    Rehab Potential Good    PT Frequency 2x / week    PT Duration 8 weeks    PT Treatment/Interventions ADLs/Self Care Home Management;Aquatic Therapy;Electrical Stimulation;DME Instruction;Gait training;Stair training;Functional mobility training;Therapeutic activities;Therapeutic exercise;Balance training;Neuromuscular re-education;Wheelchair mobility training;Patient/family education;Orthotic Fit/Training;Manual techniques;Passive range of motion    PT Next Visit Plan FYI- Pt does have feeding tube still. Pt is impulsive. Continue to work on seated and standing balance, BLE strengthening, coordination activities, core strengthening. Gait training with hand held assist of 2 people. Did well with 1 person in front and 1 in back with decreasing support today.    Consulted and Agree with Plan of Care Patient;Family member/caregiver    Family Member Consulted Mom/Dad           Patient will benefit from skilled therapeutic intervention in order to improve the following deficits and impairments:  Abnormal gait, Decreased balance, Decreased endurance, Decreased mobility, Difficulty walking, Impaired tone, Impaired sensation, Pain, Impaired UE functional use, Decreased strength, Decreased safety awareness, Decreased knowledge of use of DME, Decreased coordination, Decreased activity tolerance, Decreased cognition, Decreased range of motion  Visit Diagnosis: Other abnormalities of gait and mobility  Muscle weakness (generalized)  Unsteadiness on feet     Problem List Patient Active Problem List   Diagnosis Date Noted  . Coronary artery disease involving native coronary artery of native heart without angina pectoris  12/12/2019  . Ischemic cardiomyopathy 12/12/2019  . Brain injury ([place])   . Prediabetes   . Anoxic brain injury ([place]) 11/05/2019  . Dysphagia 11/05/2019  . Physical deconditioning 11/05/2019  . Acute delirium 11/05/2019  . Respiratory failure ([place])   . Acute  metabolic encephalopathy   . Encounter for central line placement   . Ventricular fibrillation ([place]) 09/23/2019  . Acute combined systolic and diastolic heart failure ([place]) 09/23/2019  . Ventricular tachycardia, polymorphic ([place]) 09/23/2019  . Pelvic pain affecting pregnancy 10/15/2015  . Supervision of normal pregnancy in third trimester 09/28/2015  . Poor weight gain of pregnancy 09/28/2015  . Iron deficiency anemia of pregnancy 09/01/2015  . Increased BMI (body mass index) 07/29/2015    Electa Sniff, PT, DPT, NCS 01/09/2020, 2:33 PM  [facility] [address] [place], [place], [address] Phone: [phone]   Fax:  [phone]  Name: Beverly [name] MRN: [id number] Date of Birth: [birth year]

## 2020-01-09 NOTE — Progress Notes (Signed)
Office Visit    Patient Name: Beverly [name] Date of Encounter: 01/09/2020  Primary Care Provider:  [name] Primary Cardiologist:  Yvonne Kendall, MD Electrophysiologist:  None   Chief Complaint    Beverly [name] is a 34 y.o. female with a hx of ventricular fibrillation arrest two weeks postpartum in the setting of aneurysal dilation and spontaneous coronary artery dissection of ostial LAD complicated by anoxic brain injury, ischemic cardiomyopathy, HLD, prediabetes, obesity, HTN, SVT presents today for follow up after echocardiogram   Past Medical History    Past Medical History:  Diagnosis Date  . Brain injury (HCC)   . Hypertension    last pregnancy  . Ischemic cardiomyopathy   . STEMI (ST elevation myocardial infarction) (HCC) 09/2019   SCAD with aneurysmal dilation of proximal LAD.  . Vaginal Pap smear, abnormal    when she was 34yo  . Ventricular fibrillation (HCC) 09/23/2019   Past Surgical History:  Procedure Laterality Date  . IR GASTROSTOMY TUBE MOD SED  10/07/2019  . LEFT HEART CATH AND CORONARY ANGIOGRAPHY N/A 09/23/2019   Procedure: LEFT HEART CATH AND CORONARY ANGIOGRAPHY;  Surgeon: Yvonne Kendall, MD;  Location: [facility] INVASIVE CV LAB;  Service: Cardiovascular;  Laterality: N/A;    Allergies  No Known Allergies  History of Present Illness    Beverly [name] is a 34 y.o. female with a hx of ventricular fibrillation arrest two weeks postpartum in the setting of aneurysal dilation and spontaneous coronary artery dissection of ostial LAD complicated by anoxic brain injury, ischemic cardiomyopathy, HLD, prediabetes, obesity, HTN, SVT. She was last seen 12/11/19 by Dr. Okey Dupre  She had a complicated hospitalization lasting 65 days beginning 09/23/19 after presenting the ED with chest pain approximately 2 weeks post partum. She had witnessed cardiac arrest in the ED with ventricular tachycardia degenerating to ventricular fibrillation.  Ultimately, ROSC was achieved after several shocks and rounds of epinephrine. Initial post resucitation EKG with significant anterior ST segment elevated which quickly resolved. Urgent cardiac cath revealed aneurysmal dilation of ostial/prox LAD with apparent area of dissection within the aneurysmal segment. She was transferred to [facility] for targeted care including targeted temperature management. Echo 09/24/19 with LVEF 30-35%, wall motion abnormalities, LV mildly dilated, no significant vailvular abnormalities. Prolonged course on ventilator and underwent tracheostomy and gastrostomy tube placement. Tracheostomy was covered by time of discharge on 11/28/19.  She presented for follow up 12/11/19 with her parents present and assisting with history. There were continued difficulties with memory, speech, and agnitation. She was receiving supplemental nutrition through G-tube. She was recommended for repeat echocardiogram for reassessment of LVEF. Echo 12/30/19 with LVEF improved to 50-55%, no wall motion abnormalities, indeterminate diastolic parameters, and trivial MR.   Presents today for follow up. Reviewed echocardiogram in depth with her and her mother in clinic. Her sleep, agitation, speech, and swallowing have all improved since last seen. However still with impulsivity and memory/cognitive defecits. She is working with Neuro OT/PT/ST. Seen by neurology 01/06/20, she was recommended to wean of oxcarbazepine as it was not effective and consider lamotrigine or quetiapine in future. She was referred to [facility] Re[facility], Dr. Riley Kill, as her mother used to work on the rehab department with him and requested referral.  Denies chest pain, pressure, tightness. No shortness of breath at rest but her mother notices she will get short of breath with exertion which we discussed is likely due to deconditioning. No edema, orthopnea, PND.   EKGs/Labs/Other  Studies Reviewed:   The following studies were  reviewed today:  Echo 12/30/19  1. Left ventricular ejection fraction, by estimation, is 50 to 55%. The  left ventricle has low normal function. The left ventricle has no regional  wall motion abnormalities. Left ventricular diastolic parameters are  indeterminate.   2. The mitral valve is grossly normal. Trivial mitral valve  regurgitation.   EKG:  EKG is ordered today.  The ekg ordered today demonstrates NSR with low voltage and stable lateral TWI. Previous TWI I anterior leads has improved.  Recent Labs: 10/15/2019: B Natriuretic Peptide 168.9 11/12/2019: ALT 35 11/23/2019: Hemoglobin 11.0; Magnesium 2.0; Platelets 616; TSH 0.694 12/11/2019: BUN 7; Creatinine, Ser 0.53; Potassium 3.9; Sodium 140  Recent Lipid Panel    Component Value Date/Time   CHOL 220 (H) 09/26/2019 0841   TRIG 115 10/04/2019 0332   HDL 48 09/26/2019 0841   CHOLHDL 4.6 09/26/2019 0841   VLDL 50 (H) 09/26/2019 0841   LDLCALC 122 (H) 09/26/2019 0841    Home Medications   Current Meds  Medication Sig  . acetaminophen (TYLENOL) 160 MG/5ML solution Place 20.3 mLs (650 mg total) into feeding tube every 4 (four) hours as needed for moderate pain, headache or fever.  Marland Kitchen aspirin 81 MG chewable tablet Place 1 tablet (81 mg total) into feeding tube daily.  Marland Kitchen atorvastatin (LIPITOR) 80 MG tablet Place 1 tablet (80 mg total) into feeding tube daily.  . carvedilol (COREG) 6.25 MG tablet Place 1 tablet (6.25 mg total) into feeding tube 2 (two) times daily with a meal.  . digoxin (LANOXIN) 0.125 MG tablet Place 1 tablet (0.125 mg total) into feeding tube daily.  . feeding supplement (ENSURE ENLIVE / ENSURE PLUS) LIQD Take 237 mLs by mouth 2 (two) times daily between meals.  . melatonin 3 MG TABS tablet Place 1 tablet (3 mg total) into feeding tube at bedtime as needed (insomia/nocturnal agitation).  . metaxalone (SKELAXIN) 400 MG tablet Place 1 tablet (400 mg total) into feeding tube at bedtime.  . OXcarbazepine (TRILEPTAL)  150 MG tablet Place 1 tablet (150 mg total) into feeding tube at bedtime.  . OXcarbazepine (TRILEPTAL) 150 MG tablet Place 0.5 tablets (75 mg total) into feeding tube every morning.  . pantoprazole (PROTONIX) 40 MG tablet Take 1 tablet (40 mg total) by mouth daily.  . Prenatal Vit-Fe Fumarate-FA (PRENATAL MULTIVITAMIN) TABS tablet Place 1 tablet into feeding tube daily at 12 noon.  . sacubitril-valsartan (ENTRESTO) 24-26 MG Take 1 tablet by mouth 2 (two) times daily.  Marland Kitchen spironolactone (ALDACTONE) 25 MG tablet Place 1 tablet (25 mg total) into feeding tube daily.  . Water For Irrigation, Sterile (FREE WATER) SOLN Place 200 mLs into feeding tube every 6 (six) hours.   Current Facility-Administered Medications for the 01/09/20 encounter (Office Visit) with Alver Sorrow, NP  Medication  . iron polysaccharides (NIFEREX) capsule 150 mg     Review of Systems  All other systems reviewed and are otherwise negative except as noted above.  Physical Exam    VS:  BP (!) 90/52 (BP Location: Right Arm, Patient Position: Sitting, Cuff Size: Normal)   Pulse 78   Ht 5\' 9"  (1.753 m)   Wt 180 lb (81.6 kg)   SpO2 98%   BMI 26.58 kg/m  , BMI Body mass index is 26.58 kg/m.  Wt Readings from Last 3 Encounters:  01/09/20 180 lb (81.6 kg)  01/06/20 181 lb (82.1 kg)  12/11/19 189 lb (85.7 kg)  GEN: Well nourished, well developed, in no acute distress. HEENT: normal. Neck: Supple, no JVD, carotid bruits, or masses. Cardiac: RRR, no murmurs, rubs, or gallops. No clubbing, cyanosis, edema.  Radials/DP/PT 2+ and equal bilaterally.  Respiratory:  Respirations regular and unlabored, clear to auscultation bilaterally. GI: Soft, nontender, nondistended. MS: No deformity or atrophy. Skin: Warm and dry, no rash. Neuro:  Strength and sensation are intact. Psych: Normal affect.  Assessment & Plan    1. CAD s/p STEMI - Reports no anginal symptoms. EKG today with evidence of prior anterolateral infarct  though stable compared to previous. Cardiac cath at previous VF arrest in August with aneurysmal changes to prox LAD and spontaneous dissection in ostial portion LAD. Revascularization not pursued at this time. Angiograms reviewed by Dr. Okey Dupre with no reasonable targets for percutaneous intervention. Continue GDMT including aspirin, statin, beta blocker. Refills provided.  Future consideration includes screening for fibromuscular dysplasia per Dr. Okey Dupre due to association with SCAD but recommended continued recovery to minimize stress to [place] and improve compliance with examinations.   2. Chronic HFrEF due to ischemic cardiomyopathy  With normalization of LVEF - Reduced EF 30-35% following STEMI. Repeat echo 12/30/19 with LVEF 50-55%, no wall motion abnormalities. Due to recovery of LVEF, stop Entresto due to normalization of LVEF and cost, start Losartan 12.5mg  daily, stop Digoxin as LVEF now low normal and Digoxin has significant side effect profile. Present GDMT includes Losartan, Coreg, Spironolactone. Refills provided. Could consider reduced dose of Spironolactone or discontinuation in the future.  3. HLD, LDL goal <70 - Started on Atorvastatin 80mg  daily during hospitalization. Prior to initiation, 09/26/19 LDL 122. She is due for repeat lipid panel but will defer today as she is not fasting. If LDL not at goal of <70, recommend adding Zetia 10mg  daily.   4. Ventricular fibrillation - In setting of chest pain with immediate ROSC EKG showing anterior ST elevation. Likely precipitated by acute ischemia in setting of ostial LAD CAD. Cath 12/30/19 on date of event with aneurysmal dilation of ostial and prox LAD with possible ulceration or dissection with TIMI-3 flow throughout LAD. EF now low normal 50-55% reducing risk of recurrent VF. No indication for referral to EP for ICD at this time. Continue Coreg 6.25mg  twice daily. Refill provided.   5. Anoxic brain injury - Though VF in setting of ED with  prompt resuscitation, still with significant neurological deficits. She is following with neurology and has been referred to Dr. of Physical Medicine and Rehab. Presenlty working with PT/OT.  Disposition: Follow up in 6 week(s) with Dr. 01/01/20 or APP.    Signed, Riley Kill, NP 01/09/2020, 3:34 PM [facility]

## 2020-01-13 ENCOUNTER — Other Ambulatory Visit: Payer: Self-pay

## 2020-01-13 ENCOUNTER — Ambulatory Visit: Payer: Medicaid Other | Admitting: Occupational Therapy

## 2020-01-13 ENCOUNTER — Ambulatory Visit: Payer: Medicaid Other | Attending: Student | Admitting: Speech Pathology

## 2020-01-13 ENCOUNTER — Encounter: Payer: Self-pay | Admitting: Occupational Therapy

## 2020-01-13 ENCOUNTER — Ambulatory Visit: Payer: Medicaid Other

## 2020-01-13 DIAGNOSIS — R471 Dysarthria and anarthria: Secondary | ICD-10-CM | POA: Insufficient documentation

## 2020-01-13 DIAGNOSIS — R41844 Frontal lobe and executive function deficit: Secondary | ICD-10-CM | POA: Insufficient documentation

## 2020-01-13 DIAGNOSIS — R29818 Other symptoms and signs involving the nervous system: Secondary | ICD-10-CM | POA: Diagnosis present

## 2020-01-13 DIAGNOSIS — R278 Other lack of coordination: Secondary | ICD-10-CM | POA: Diagnosis present

## 2020-01-13 DIAGNOSIS — R1312 Dysphagia, oropharyngeal phase: Secondary | ICD-10-CM | POA: Insufficient documentation

## 2020-01-13 DIAGNOSIS — R2681 Unsteadiness on feet: Secondary | ICD-10-CM | POA: Diagnosis present

## 2020-01-13 DIAGNOSIS — R41840 Attention and concentration deficit: Secondary | ICD-10-CM | POA: Diagnosis present

## 2020-01-13 DIAGNOSIS — R262 Difficulty in walking, not elsewhere classified: Secondary | ICD-10-CM | POA: Diagnosis present

## 2020-01-13 DIAGNOSIS — R2689 Other abnormalities of gait and mobility: Secondary | ICD-10-CM

## 2020-01-13 DIAGNOSIS — G931 Anoxic brain damage, not elsewhere classified: Secondary | ICD-10-CM | POA: Insufficient documentation

## 2020-01-13 DIAGNOSIS — R41841 Cognitive communication deficit: Secondary | ICD-10-CM | POA: Insufficient documentation

## 2020-01-13 DIAGNOSIS — M6281 Muscle weakness (generalized): Secondary | ICD-10-CM

## 2020-01-13 NOTE — Therapy (Signed)
[place] MAIN [facility] SERVICES [address] [place], [place], [address] Phone: [phone]   Fax:  [phone]  Physical Therapy Treatment  Patient Details  Name: Beverly [name] MRN: [id number] Date of Birth: [birth year] Referring Provider (PT): Mercy Riding, MD   Encounter Date: 01/13/2020   PT End of Session - 01/13/20 1657    Visit Number 9    PT Start Time 0407    PT Stop Time 2505    PT Time Calculation (min) 38 min    Equipment Utilized During Treatment Gait belt    Activity Tolerance Patient tolerated treatment well    Behavior During Therapy Impulsive           Past Medical History:  Diagnosis Date  . Brain injury ([place])   . CAD (coronary artery disease)   . HLD (hyperlipidemia)   . Hypertension    last pregnancy  . Ischemic cardiomyopathy   . STEMI (ST elevation myocardial infarction) ([place]) 09/2019   SCAD with aneurysmal dilation of proximal LAD.  . Vaginal Pap smear, abnormal    when she was 34yo  . Ventricular fibrillation ([place]) 09/23/2019    Past Surgical History:  Procedure Laterality Date  . IR GASTROSTOMY TUBE MOD SED  10/07/2019  . LEFT HEART CATH AND CORONARY ANGIOGRAPHY N/A 09/23/2019   Procedure: LEFT HEART CATH AND CORONARY ANGIOGRAPHY;  Surgeon: Nelva Bush, MD;  Location: [place] CV LAB;  Service: Cardiovascular;  Laterality: N/A;    There were no vitals filed for this visit.   Subjective Assessment - 01/13/20 1655    Subjective The patient reports no new issues.  She has been standing around 5 mins at a time at home with close supervision.    Patient is accompained by: Family member   mom   Pertinent History HTN    Limitations Sitting;Standing;Walking;House hold activities    Patient Stated Goals walk better    Currently in Pain? No/denies    Pain Score 0-No pain             Interventions: Supine Hip abduction with red TB x10 Hip adduction with x10 Bridging x10 cueing to pelvic   Seated Seated LAQ/Marching x10 each bilaterally Sit to stands with min A x10  Sitting on green disc Reaching for targets x 2 min with verbal cues to tighten abs to help stabilize,  Marching with BUE on mat 2x5  Sit to stand from green disc x 5 with min A/CGA  Static standing 1' x4 performed throughout session Standing with min/mod assist: tapping 3 different colored discs on floor on command x3 each                       PT Education - 01/13/20 1656    Education Details posture, transfers    Person(s) Educated Patient    Methods Explanation    Comprehension Verbalized understanding            PT Short Term Goals - 01/07/20 1319      PT SHORT TERM GOAL #1   Title Patient/caregiver will report independence with HEP with caregiver assistance (All STGs Due: 01/07/2020)    Baseline reports independence; only completing 1-2x/week per mom reports    Time 4    Period Weeks    Status Partially Met    Target Date 01/07/20      PT SHORT TERM GOAL #2   Title Patient will demo ability to complete transfer  from w/c <> mat with CGA with LRAD to demonstrate improved functional mobility    Baseline intermittent CGA- Min A    Time 4    Period Weeks    Status Partially Met    Target Date 01/07/20      PT SHORT TERM GOAL #3   Title Patient will demo ability to ambulate >/= 200 ft  with LRAD or hand hand assist with moderate assistance to demonstrate improved functional mobility in home.    Baseline 115 ft max assist +2 hand held assist.    Time 4    Period Weeks    Status On-going             PT Long Term Goals - 12/18/19 0840      PT LONG TERM GOAL #1   Title Patient/caregiver will report indepence with final HEP with caregiver assistance (All LTGS: 02/04/2020)    Baseline no HEP established    Time 8    Period Weeks    Status New      PT LONG TERM GOAL #2   Title Patient will demo ability to complete all bed mobility Mod I to demonstrate improved  independence    Baseline CGA/Supervision    Time 8    Period Weeks    Status New      PT LONG TERM GOAL #3   Title Patient will demo ability to complete sit <> stand x 5 reps with supervision to demonstrate improved balance and functional mobility    Baseline CGA - Min A    Time 8    Period Weeks    Status New      PT LONG TERM GOAL #4   Title Patient will demo ability to ambulate > 400 ft w/ LRAD versus hand held assist min assist to demonstrate improved household mobility and short community distances.    Baseline TBA    Time 8    Period Weeks    Status Revised                 Plan - 01/13/20 1658    Clinical Impression Statement Patient able to perform sit to stands with min A and requires UE support for static standing.  The patient continues to benefit from additional skilled PT services to improve coordination, strength and stability for improved ability to independently perform sit to stand transitions and static standing.    Personal Factors and Comorbidities Comorbidity 1;Behavior Pattern;Transportation    Comorbidities HTN    Examination-Activity Limitations Bed Mobility;Dressing;Hygiene/Grooming;Caring for Others;Stairs;Stand;Toileting;Transfers;Sit;Bathing    Examination-Participation Restrictions Occupation;Community Activity;Medication Management;Cleaning    Stability/Clinical Decision Making Evolving/Moderate complexity    Rehab Potential Good    PT Frequency 2x / week    PT Duration 8 weeks    PT Treatment/Interventions ADLs/Self Care Home Management;Aquatic Therapy;Electrical Stimulation;DME Instruction;Gait training;Stair training;Functional mobility training;Therapeutic activities;Therapeutic exercise;Balance training;Neuromuscular re-education;Wheelchair mobility training;Patient/family education;Orthotic Fit/Training;Manual techniques;Passive range of motion    PT Next Visit Plan FYI- Pt does have feeding tube still. Pt is impulsive. Continue to work on  seated and standing balance, BLE strengthening, coordination activities, core strengthening. Gait training with hand held assist of 2 people. Did well with 1 person in front and 1 in back with decreasing support today.    Consulted and Agree with Plan of Care Patient;Family member/caregiver    Family Member Consulted Mom/Dad           Patient will benefit from skilled therapeutic intervention in order to improve the  following deficits and impairments:  Abnormal gait, Decreased balance, Decreased endurance, Decreased mobility, Difficulty walking, Impaired tone, Impaired sensation, Pain, Impaired UE functional use, Decreased strength, Decreased safety awareness, Decreased knowledge of use of DME, Decreased coordination, Decreased activity tolerance, Decreased cognition, Decreased range of motion  Visit Diagnosis: Muscle weakness (generalized)  Other lack of coordination  Other abnormalities of gait and mobility  Unsteadiness on feet  Frontal lobe and executive function deficit  Attention and concentration deficit  Cognitive communication deficit  Difficulty in walking, not elsewhere classified  Other symptoms and signs involving the nervous system     Problem List Patient Active Problem List   Diagnosis Date Noted  . Coronary artery disease involving native coronary artery of native heart without angina pectoris 12/12/2019  . Ischemic cardiomyopathy 12/12/2019  . Brain injury ([place])   . Prediabetes   . Anoxic brain injury ([place]) 11/05/2019  . Dysphagia 11/05/2019  . Physical deconditioning 11/05/2019  . Acute delirium 11/05/2019  . Respiratory failure ([place])   . Acute metabolic encephalopathy   . Encounter for central line placement   . Ventricular fibrillation ([place]) 09/23/2019  . Acute combined systolic and diastolic heart failure ([place]) 09/23/2019  . Ventricular tachycardia, polymorphic ([place]) 09/23/2019  . Pelvic pain affecting pregnancy 10/15/2015  . Supervision of  normal pregnancy in third trimester 09/28/2015  . Poor weight gain of pregnancy 09/28/2015  . Iron deficiency anemia of pregnancy 09/01/2015  . Increased BMI (body mass index) 07/29/2015    Hal Morales PT, DPT 01/13/2020, 5:16 PM  [place] MAIN [facility] SERVICES [address] [place], [place], [address] Phone: [phone]   Fax:  [phone]  Name: Beverly [name] MRN: [id number] Date of Birth: 12/20/1985

## 2020-01-13 NOTE — Therapy (Signed)
[facility] MAIN [facility] SERVICES [address] [place], [place], [address] Phone: [phone]   Fax:  [phone]  Occupational Therapy Treatment  Patient Details  Name: Beverly [name] MRN: [id number] Date of Birth: 05-Aug-1985 Referring Provider (OT): Candelaria Stagers MD   Encounter Date: 01/13/2020   OT End of Session - 01/13/20 1618    Visit Number 6    Number of Visits 24    Date for OT Re-Evaluation 03/16/20    Authorization Type Medicaid OON    OT Start Time 1515    OT Stop Time 1600    OT Time Calculation (min) 45 min    Activity Tolerance Patient tolerated treatment well    Behavior During Therapy Impulsive           Past Medical History:  Diagnosis Date  . Brain injury (HCC)   . CAD (coronary artery disease)   . HLD (hyperlipidemia)   . Hypertension    last pregnancy  . Ischemic cardiomyopathy   . STEMI (ST elevation myocardial infarction) (HCC) 09/2019   SCAD with aneurysmal dilation of proximal LAD.  . Vaginal Pap smear, abnormal    when she was 34yo  . Ventricular fibrillation (HCC) 09/23/2019    Past Surgical History:  Procedure Laterality Date  . IR GASTROSTOMY TUBE MOD SED  10/07/2019  . LEFT HEART CATH AND CORONARY ANGIOGRAPHY N/A 09/23/2019   Procedure: LEFT HEART CATH AND CORONARY ANGIOGRAPHY;  Surgeon: Yvonne Kendall, MD;  Location: [facility] INVASIVE CV LAB;  Service: Cardiovascular;  Laterality: N/A;    There were no vitals filed for this visit.   Subjective Assessment - 01/13/20 1617    Subjective  Pt denies any pain. Reports no changes.    Patient is accompanied by: Family member    Pertinent History On 09/23/19 patient presented to ED 2 weeks postpartum (had emergent C-Section) with chest pain. In ED sustained VFib Cardiac Arrest requiring intubation. EKG showed ST elevation, Cath Lab showed aneurysmal dissection of the LAD was identified. Course complicated by encephalopathy, anoxic brain injury, enterobacterial  PNAPMH: HTN    Currently in Pain? No/denies          Orthothic Fitting:  Pt. has bilateral resting hand splints. Proper splint fit was assessed. Pt. currently has large bilateral resting hand splints.  Pt. could benefit from medium sized resting hand splints. Pt.'s mother reports that the splints move around at night, and her fingers come out of the splint.  Therapeutic Ex.:  Pt. Performed AROM I the BUEs. Pt. education was provided about self-ROM to the the right, and left digit MPs, PIPs, and DIPs.  Pt. has transitioned outpatient therapy services to [facility] from [place] to be more local. Pt. to continue with the same POC, and goals. Pt. could benefit from smaller bilateral resting hand splints, as her current splints are too large. Pt.'s mother expressed concern about the coordination of medical care, and all the intricate follow-up needs of the pt. following the brain injury. Pt.'s mother reports that the  Neurologist put a referral in with Dr. Rosalyn Charters office. Pt. Continues to benefit from OT services to improve UE functioning, and Cognitive, ADL, and IADL needs to maximize overall independence.                    OT Education - 01/13/20 1617    Person(s) Educated Patient;Parent(s)    Methods Explanation    Comprehension Need further instruction  OT Short Term Goals - 01/07/20 1308      OT SHORT TERM GOAL #1   Title Pt/ caregiver will be I with inital HEP.    Baseline dependent    Time 6    Period Weeks    Status On-going    Target Date 02/03/20      OT SHORT TERM GOAL #2   Title Pt will perform UB dressing consistently with supervision/ set up and min v.c    Baseline min A    Time 6    Period Weeks    Status Achieved      OT SHORT TERM GOAL #3   Title Pt will demonstrate sustained attention to a simple functional task for 2 mins, in a minimally distracting environment.    Baseline 1 min with re-direction    Time 6    Period Weeks     Status Achieved      OT SHORT TERM GOAL #4   Title Pt will wash her face and brush her teeth with no more than min v.c for thoroughness    Baseline mod A    Time 6    Period Weeks    Status On-going      OT SHORT TERM GOAL #5   Title Pt will demonstrate improved bilateral functional use as evidenced by increasing box/blocks score by 3 blocks    Baseline RUE10 blocks, LUE 12 blocks    Time 6    Period Weeks    Status On-going      OT SHORT TERM GOAL #6   Title Pt will demonstrate ability to sequence a simple functional or ADL task with no more than min v.c    Baseline Pt requires grossly mod A with sequencing simple activities    Time 6    Period Weeks    Status On-going             OT Long Term Goals - 12/31/19 1713      OT LONG TERM GOAL #1   Title Pt will perform LB bathing and dressing with no more than min A.    Baseline max A    Time 12    Period Weeks    Status On-going      OT LONG TERM GOAL #2   Title Pt will demonstrate improved bilateral UE functional use by increasing box/ blocks score by 6 blocks from inital measurement.    Baseline RUE 10 blocks, LUE 12 blocks    Time 12    Period Weeks    Status On-going      OT LONG TERM GOAL #3   Title Pt will perform simple cold snack prep with min A.    Baseline dependent    Time 12    Period Weeks    Status On-going      OT LONG TERM GOAL #4   Title Pt will attend to an ADL/ simple functional familiar task for 10 mins.    Baseline attends for grossly 1 min with re-direct.    Time 12    Period Weeks    Status On-going      OT LONG TERM GOAL #5   Title Pt perform a simple home management task with no more than min A, demonstrating good safety awareness.    Baseline dependent    Time 12    Period Weeks    Status On-going      OT LONG TERM GOAL #6   Title  Pt will write her name with 75% legibility    Baseline handwriting is illegibile    Time 12    Period Weeks    Status On-going                  Plan - 01/13/20 1618    Clinical Impression Statement Pt. has transitioned outpatient therapy [facility] [place] to be more local. Pt. to continue with the same POC, and goals. Pt. could benefit from smaller bilateral resting hand splints, as her current splints are too large. Pt.'s mother expressed concern about the coordination of medical care, and all the intricate follow-up needs of the pt. following the brain injury. Pt.'s mother reports that the  Neurologist put a referral in with Dr. Rosalyn ChartersSwartz's office. Pt. Continues to benefit from OT services to improve UE functioning, and Cognitive, ADL, and IADL needs to maximize overall independence.   OT Occupational Profile and History Detailed Assessment- Review of Records and additional review of physical, cognitive, psychosocial history related to current functional performance    Occupational performance deficits (Please refer to evaluation for details): ADL's;IADL's;Work;Leisure;Social Participation    Body Structure / Function / Physical Skills ADL;Endurance;UE functional use;Balance;Pain;Flexibility;FMC;ROM;GMC;Coordination;Decreased knowledge of precautions;Decreased knowledge of use of DME;IADL;Dexterity;Strength;Mobility;Tone;Vision;Sensation;Gait    Cognitive Skills Attention;Energy/Drive;Memory;Orientation;Problem Solve;Safety Awareness;Thought;Understand    Rehab Potential Good    Clinical Decision Making Several treatment options, min-mod task modification necessary    Comorbidities Affecting Occupational Performance: May have comorbidities impacting occupational performance    Modification or Assistance to Complete Evaluation  Min-Moderate modification of tasks or assist with assess necessary to complete eval    OT Frequency 2x / week    OT Duration 12 weeks    OT Treatment/Interventions Self-care/ADL training;Ultrasound;Visual/perceptual remediation/compensation;Patient/family education;Scar mobilization;Aquatic  Therapy;Paraffin;Passive range of motion;Balance training;Dry needling;Stair Training;Fluidtherapy;Cryotherapy;Splinting;Moist Heat;Manual Therapy;Therapeutic exercise;Therapeutic activities;Functional Mobility Training;Neuromuscular education;Cognitive remediation/compensation    Consulted and Agree with Plan of Care Patient;Family member/caregiver           Patient will benefit from skilled therapeutic intervention in order to improve the following deficits and impairments:   Body Structure / Function / Physical Skills: ADL, Endurance, UE functional use, Balance, Pain, Flexibility, FMC, ROM, GMC, Coordination, Decreased knowledge of precautions, Decreased knowledge of use of DME, IADL, Dexterity, Strength, Mobility, Tone, Vision, Sensation, Gait Cognitive Skills: Attention, Energy/Drive, Memory, Orientation, Problem Solve, Safety Awareness, Thought, Understand     Visit Diagnosis: Muscle weakness (generalized)  Other lack of coordination    Problem List Patient Active Problem List   Diagnosis Date Noted  . Coronary artery disease involving native coronary artery of native heart without angina pectoris 12/12/2019  . Ischemic cardiomyopathy 12/12/2019  . Brain injury (HCC)   . Prediabetes   . Anoxic brain injury (HCC) 11/05/2019  . Dysphagia 11/05/2019  . Physical deconditioning 11/05/2019  . Acute delirium 11/05/2019  . Respiratory failure (HCC)   . Acute metabolic encephalopathy   . Encounter for central line placement   . Ventricular fibrillation (HCC) 09/23/2019  . Acute combined systolic and diastolic heart failure (HCC) 09/23/2019  . Ventricular tachycardia, polymorphic (HCC) 09/23/2019  . Pelvic pain affecting pregnancy 10/15/2015  . Supervision of normal pregnancy in third trimester 09/28/2015  . Poor weight gain of pregnancy 09/28/2015  . Iron deficiency anemia of pregnancy 09/01/2015  . Increased BMI (body mass index) 07/29/2015    Olegario MessierElaine Zechariah Bissonnette, MS,  OTR/L 01/13/2020, 4:22 PM  [facility] MAIN [facility] SERVICES [address] [place], [place], [address]  Phone: [phone]   Fax:  [phone]  Name: Beverly [name] MRN: [id number] Date of Birth: Sep 15, 1985

## 2020-01-14 ENCOUNTER — Encounter: Payer: Self-pay | Admitting: Physical Medicine & Rehabilitation

## 2020-01-14 NOTE — Therapy (Signed)
[place] MAIN [facility] SERVICES [address] [place], [place], [address] Phone: [phone]   Fax:  [phone]  Speech Language Pathology Treatment  Patient Details  Name: Beverly [name] MRN: [id number] Date of Birth: 07/17/1985 Referring Provider (SLP): Dr. Candelaria Stagers   Encounter Date: 01/13/2020   End of Session - 01/13/20 1400    Visit Number 4    Number of Visits 25    Date for SLP Re-Evaluation 03/03/20    Authorization Time Period 27 visits approved through 02/06/20    Authorization - Visit Number 14    Authorization - Number of Visits 27    Progress Note Due on Visit 10    SLP Start Time 1400    SLP Stop Time  1450    SLP Time Calculation (min) 50 min    Activity Tolerance Patient tolerated treatment well           Past Medical History:  Diagnosis Date  . Brain injury (HCC)   . CAD (coronary artery disease)   . HLD (hyperlipidemia)   . Hypertension    last pregnancy  . Ischemic cardiomyopathy   . STEMI (ST elevation myocardial infarction) (HCC) 09/2019   SCAD with aneurysmal dilation of proximal LAD.  . Vaginal Pap smear, abnormal    when she was 34yo  . Ventricular fibrillation (HCC) 09/23/2019    Past Surgical History:  Procedure Laterality Date  . IR GASTROSTOMY TUBE MOD SED  10/07/2019  . LEFT HEART CATH AND CORONARY ANGIOGRAPHY N/A 09/23/2019   Procedure: LEFT HEART CATH AND CORONARY ANGIOGRAPHY;  Surgeon: Yvonne Kendall, MD;  Location: [facility] INVASIVE CV LAB;  Service: Cardiovascular;  Laterality: N/A;    There were no vitals filed for this visit.   Subjective Assessment - 01/13/20 1400   Subjective Looks to right wall when facing therapist    Patient is accompained by: Family member   mom [name]   Currently in Pain? No/denies                 ADULT SLP TREATMENT - 01/13/20 1400      General Information   Behavior/Cognition Alert;Confused;Distractible;Requires cueing;Decreased sustained attention     HPI Pt is a 34 y.o. female with PMH of HTN who was admitted 09/23/19 2 weeks post partum after emergent C section due to rupture of membranes with VF arrest. Pt with enterobacter PNA and encephalopathy, trach placed 8/30 and PEG on 8/31. MRI 8/31: No evidence of anoxic brain injury. Pt changed to #4 cuffless trach on 9/15; capped on 9/21 and decannulated on 9/25. MBS 9/22, POs started - primary oral dysphagia.   Pt currently using PEG as primary nutrition and hydration and for some meds, due to behavior.      Treatment Provided   Treatment provided Cognitive-Linquistic      Pain Assessment   Pain Assessment No/denies pain      Cognitive-Linquistic Treatment   Treatment focused on Cognition    Skilled Treatment Discussed goals/focus of therapy with pt/mother as visits are limited. Mother would like to focus on cognition and swallowing. Mother reports pt pocketing food. SUSTAINED ATTENTION: Patient sustained attention to simple language and recall tasks with usual mod-max cues (tactile cues necessary occasionally for impulsivity). Recall 1 image from F:6 80% accuracy, 2 images from F: 8 70% accuracy. SIMPLE REASONING: Categorization/name like item 50% accuracy from F:3, max cues due to impulsivity. ID'd parts of a whole from F:3 80% accuracy mod cues. ORGANIZATION: Simple  card sort (8 cards) 75% accuracy occasional mod cues. MEMORY: Used spaced retrieval and demo'd this technique to mother for recall of functional information (such as therapy appointments, visit from family/friend). Pt recalled day of next therapy appointment independently at 1 min, with mod cues at 2 min, 5 min, 10 min intervals.      Assessment / Recommendations / Plan   Plan Continue with current plan of care      Progression Toward Goals   Progression toward goals Progressing toward goals            SLP Education - 01/14/20 0917    Education Details attention likely contributes to pt's difficulties with eating     Person(s) Educated Patient;Parent(s)    Methods Explanation    Comprehension Verbalized understanding;Need further instruction            SLP Short Term Goals - 01/08/20 0022      SLP SHORT TERM GOAL #1   Title Beverly [name] will sustain attention for 5 minutes to simple cognitive task with occasional min A over 3 sessions    Baseline 1-2 minutes    Time 5    Period Weeks    Status On-going      SLP SHORT TERM GOAL #2   Title Pt will verbalize 2 safety rules for walking at home with occasional min A over 3 sessions    Baseline pt states she can walk by herself    Time 5    Period Weeks    Status On-going      SLP SHORT TERM GOAL #3   Title Pt will use simple external aid to follow simple schedule and prepare for appointments with usual min A from family    Baseline does not recall or prepare for appointments    Time 5    Period Weeks    Status On-going      SLP SHORT TERM GOAL #4   Title Pt will use visual and written word cues to wash her face with usual min verbal cues from mom over 3 sessions    Baseline Per mom, pt only touches her face 2-3 times when asked to wash her face    Time 5    Period Weeks    Status On-going            SLP Long Term Goals - 01/08/20 0023      SLP LONG TERM GOAL #1   Title Pt will use external aids/to do list to complete 3 simple chores a day with occasional min A over 3 sessions    Time 11    Period Weeks    Status On-going      SLP LONG TERM GOAL #2   Title Pt will sustain attention for 10 minutes to simple cognitive task with 4 or less re-directions over 3 sessions    Baseline sustains attention for 1-2 minutes    Time 11    Period Weeks    Status On-going      SLP LONG TERM GOAL #3   Title Pt will use visual cues and schedule to eat 2 meals a day and drink 24 oz of liquid with usual min A over 3 sessions    Baseline pt eating less than 1 meal a day and taking only sips (PEG is used)    Time 11    Period Weeks    Status On-going       SLP LONG TERM GOAL #4  Title Pt will use reduced rate to be intellgible in 5 minute conversation with occasional min A    Baseline Rapid rate, rushes of speech, 35% of utterances unintelligible    Time 11    Period Weeks    Status On-going            Plan - 01/13/20 1400    Clinical Impression Statement Patient continues with severe cognitive linguistic impairments, dysarthria and dysphagia after cardiac arrest which resulted in anoxic brain injury. Continues to use PEG for primary nutrition and hydration, as well as meds. She sustains attention for approximately 1-2 minutes; impulsivity noted in most tasks today. Attention, awareness and memory are significantly impaired. I recommend cont'd skilled ST to maximize cogntion and intelligibility for safety, to reduce caregiver burden.    Speech Therapy Frequency 2x / week    Duration 12 weeks   or 25 visits   Treatment/Interventions Language facilitation;Environmental controls;Cueing hierarchy;SLP instruction and feedback;Aspiration precaution training;Compensatory techniques;Cognitive reorganization;Functional tasks;Compensatory strategies;Diet toleration management by SLP;Trials of upgraded texture/liquids;Internal/external aids;Multimodal communcation approach;Patient/family education    Potential to Achieve Goals Fair    Potential Considerations Severity of impairments;Financial resources           Patient will benefit from skilled therapeutic intervention in order to improve the following deficits and impairments:   Cognitive communication deficit    Problem List Patient Active Problem List   Diagnosis Date Noted  . Coronary artery disease involving native coronary artery of native heart without angina pectoris 12/12/2019  . Ischemic cardiomyopathy 12/12/2019  . Brain injury (HCC)   . Prediabetes   . Anoxic brain injury (HCC) 11/05/2019  . Dysphagia 11/05/2019  . Physical deconditioning 11/05/2019  . Acute delirium  11/05/2019  . Respiratory failure (HCC)   . Acute metabolic encephalopathy   . Encounter for central line placement   . Ventricular fibrillation (HCC) 09/23/2019  . Acute combined systolic and diastolic heart failure (HCC) 09/23/2019  . Ventricular tachycardia, polymorphic (HCC) 09/23/2019  . Pelvic pain affecting pregnancy 10/15/2015  . Supervision of normal pregnancy in third trimester 09/28/2015  . Poor weight gain of pregnancy 09/28/2015  . Iron deficiency anemia of pregnancy 09/01/2015  . Increased BMI (body mass index) 07/29/2015   Rondel Baton, MS, CCC-SLP Speech-Language Pathologist  Arlana Lindau 01/14/2020, 9:33 AM  [place] [facility] MAIN [facility] SERVICES [address] [place], [place], [address] Phone: [phone]   Fax:  [phone]   Name: Beverly [name] MRN: [id number] Date of Birth: [birth year]

## 2020-01-15 ENCOUNTER — Other Ambulatory Visit: Payer: Self-pay

## 2020-01-15 ENCOUNTER — Ambulatory Visit: Payer: Medicaid Other | Admitting: Speech Pathology

## 2020-01-15 ENCOUNTER — Ambulatory Visit: Payer: Medicaid Other | Admitting: Occupational Therapy

## 2020-01-15 ENCOUNTER — Ambulatory Visit: Payer: Medicaid Other

## 2020-01-15 DIAGNOSIS — R41841 Cognitive communication deficit: Secondary | ICD-10-CM | POA: Diagnosis not present

## 2020-01-15 DIAGNOSIS — M6281 Muscle weakness (generalized): Secondary | ICD-10-CM

## 2020-01-15 DIAGNOSIS — R278 Other lack of coordination: Secondary | ICD-10-CM

## 2020-01-15 DIAGNOSIS — G931 Anoxic brain damage, not elsewhere classified: Secondary | ICD-10-CM

## 2020-01-15 DIAGNOSIS — R1312 Dysphagia, oropharyngeal phase: Secondary | ICD-10-CM

## 2020-01-15 DIAGNOSIS — R471 Dysarthria and anarthria: Secondary | ICD-10-CM

## 2020-01-15 NOTE — Therapy (Signed)
[place] MAIN [facility] SERVICES [address] [place], [place], [address] Phone: [phone]   Fax:  [phone]  Physical Therapy Treatment  Patient Details  Name: Beverly [name] MRN: [id number] Date of Birth: [birth year] Referring Provider (PT): Mercy Riding, MD   Encounter Date: 01/15/2020   PT End of Session - 01/15/20 1715    Visit Number 9    PT Start Time 0315    PT Stop Time 0400    PT Time Calculation (min) 45 min    Equipment Utilized During Treatment Gait belt    Activity Tolerance Patient tolerated treatment well           Past Medical History:  Diagnosis Date  . Brain injury ([place])   . CAD (coronary artery disease)   . HLD (hyperlipidemia)   . Hypertension    last pregnancy  . Ischemic cardiomyopathy   . STEMI (ST elevation myocardial infarction) ([place]) 09/2019   SCAD with aneurysmal dilation of proximal LAD.  . Vaginal Pap smear, abnormal    when she was 34yo  . Ventricular fibrillation ([place]) 09/23/2019    Past Surgical History:  Procedure Laterality Date  . IR GASTROSTOMY TUBE MOD SED  10/07/2019  . LEFT HEART CATH AND CORONARY ANGIOGRAPHY N/A 09/23/2019   Procedure: LEFT HEART CATH AND CORONARY ANGIOGRAPHY;  Surgeon: Nelva Bush, MD;  Location: [facility];  Service: Cardiovascular;  Laterality: N/A;    There were no vitals filed for this visit.   Subjective Assessment - 01/15/20 1518    Subjective The patient reports she is doing good today.    Patient is accompained by: Family member   mom   Pertinent History HTN    Limitations Sitting;Standing;Walking;House hold activities    Patient Stated Goals walk better    Currently in Pain? No/denies    Pain Score 0-No pain                Interventions: Supine: Tactile and verbal cueing  Hip abduction with red TB 5"x10 Hip adduction with 5"x10 Bridging x10    Seated Seated LAQ/Marching x10 each bilaterally Sit to stands x5 with 1 min  standing each rep, tactile cueing and CGA to stand,   Sitting on green pad balance with reaching/hitting balloon 1'x3  CGA Ambulation in parallel bars light UE use 4 laps forward Side stepping in bars without UE support                   PT Education - 01/15/20 1723    Education Details transfers, posture    Person(s) Educated Patient    Methods Explanation    Comprehension Verbalized understanding            PT Short Term Goals - 01/07/20 1319      PT SHORT TERM GOAL #1   Title Patient/caregiver will report independence with HEP with caregiver assistance (All STGs Due: 01/07/2020)    Baseline reports independence; only completing 1-2x/week per mom reports    Time 4    Period Weeks    Status Partially Met    Target Date 01/07/20      PT SHORT TERM GOAL #2   Title Patient will demo ability to complete transfer from w/c <> mat with CGA with LRAD to demonstrate improved functional mobility    Baseline intermittent CGA- Min A    Time 4    Period Weeks    Status Partially Met    Target  Date 01/07/20      PT SHORT TERM GOAL #3   Title Patient will demo ability to ambulate >/= 200 ft  with LRAD or hand hand assist with moderate assistance to demonstrate improved functional mobility in home.    Baseline 115 ft max assist +2 hand held assist.    Time 4    Period Weeks    Status On-going             PT Long Term Goals - 12/18/19 0840      PT LONG TERM GOAL #1   Title Patient/caregiver will report indepence with final HEP with caregiver assistance (All LTGS: 02/04/2020)    Baseline no HEP established    Time 8    Period Weeks    Status New      PT LONG TERM GOAL #2   Title Patient will demo ability to complete all bed mobility Mod I to demonstrate improved independence    Baseline CGA/Supervision    Time 8    Period Weeks    Status New      PT LONG TERM GOAL #3   Title Patient will demo ability to complete sit <> stand x 5 reps with supervision to  demonstrate improved balance and functional mobility    Baseline CGA - Min A    Time 8    Period Weeks    Status New      PT LONG TERM GOAL #4   Title Patient will demo ability to ambulate > 400 ft w/ LRAD versus hand held assist min assist to demonstrate improved household mobility and short community distances.    Baseline TBA    Time 8    Period Weeks    Status Revised                 Plan - 01/15/20 1716    Clinical Impression Statement Patient able to ambulate inside parallel bars with CGA and verbal cueing for stepping.  The patient challenged with sitting balance when using UEs for activities. The patient continues to require cueing for exercise technique both verbal and tactile.  The patient continues to benefit form additional skilled PT services to improve ability to perform transfers and ambulate on level surfaces.    Personal Factors and Comorbidities Comorbidity 1;Behavior Pattern;Transportation    Comorbidities HTN    Examination-Activity Limitations Bed Mobility;Dressing;Hygiene/Grooming;Caring for Others;Stairs;Stand;Toileting;Transfers;Sit;Bathing    Examination-Participation Restrictions Occupation;Community Activity;Medication Management;Cleaning    Stability/Clinical Decision Making Evolving/Moderate complexity    Rehab Potential Good    PT Frequency 2x / week    PT Duration 8 weeks    PT Treatment/Interventions ADLs/Self Care Home Management;Aquatic Therapy;Electrical Stimulation;DME Instruction;Gait training;Stair training;Functional mobility training;Therapeutic activities;Therapeutic exercise;Balance training;Neuromuscular re-education;Wheelchair mobility training;Patient/family education;Orthotic Fit/Training;Manual techniques;Passive range of motion    PT Next Visit Plan FYI- Pt does have feeding tube still. Pt is impulsive. Continue to work on seated and standing balance, BLE strengthening, coordination activities, core strengthening. Gait training with  hand held assist of 2 people. Did well with 1 person in front and 1 in back with decreasing support today.    Consulted and Agree with Plan of Care Patient;Family member/caregiver    Family Member Consulted Mom/Dad           Patient will benefit from skilled therapeutic intervention in order to improve the following deficits and impairments:  Abnormal gait,Decreased balance,Decreased endurance,Decreased mobility,Difficulty walking,Impaired tone,Impaired sensation,Pain,Impaired UE functional use,Decreased strength,Decreased safety awareness,Decreased knowledge of use of DME,Decreased coordination,Decreased activity tolerance,Decreased  cognition,Decreased range of motion  Visit Diagnosis: Anoxic brain injury [facility])     Problem List Patient Active Problem List   Diagnosis Date Noted  . Coronary artery disease involving native coronary artery of native heart without angina pectoris 12/12/2019  . Ischemic cardiomyopathy 12/12/2019  . Brain injury ([place])   . Prediabetes   . Anoxic brain injury ([place]) 11/05/2019  . Dysphagia 11/05/2019  . Physical deconditioning 11/05/2019  . Acute delirium 11/05/2019  . Respiratory failure ([place])   . Acute metabolic encephalopathy   . Encounter for central line placement   . Ventricular fibrillation ([place]) 09/23/2019  . Acute combined systolic and diastolic heart failure ([place]) 09/23/2019  . Ventricular tachycardia, polymorphic ([place]) 09/23/2019  . Pelvic pain affecting pregnancy 10/15/2015  . Supervision of normal pregnancy in third trimester 09/28/2015  . Poor weight gain of pregnancy 09/28/2015  . Iron deficiency anemia of pregnancy 09/01/2015  . Increased BMI (body mass index) 07/29/2015    Hal Morales PT, DPT 01/15/2020, 5:23 PM  [place] MAIN [facility] SERVICES [address] [place], [place], [address] Phone: [phone]   Fax:  [phone]  Name: POSIE LILLIBRIDGE MRN: [id number] Date of Birth:  [birth year]

## 2020-01-15 NOTE — Therapy (Signed)
[place] [facility] MAIN [facility] SERVICES [address] [place], [place], [address] Phone: [phone]   Fax:  [phone]  Speech Language Pathology Treatment  Patient Details  Name: Beverly [name] MRN: [id number] Date of Birth: 10-Mar-1985 Referring Provider (SLP): Dr. Candelaria Stagers   Encounter Date: 01/15/2020   End of Session - 01/15/20 1703    Visit Number 5    Number of Visits 25    Date for SLP Re-Evaluation 03/03/20    Authorization Time Period 27 visits approved through 02/06/20    Authorization - Visit Number 17    Authorization - Number of Visits 27    Progress Note Due on Visit 10    SLP Start Time 1300    SLP Stop Time  1355    SLP Time Calculation (min) 55 min    Activity Tolerance Patient tolerated treatment well           Past Medical History:  Diagnosis Date  . Brain injury ([facility])   . CAD (coronary artery disease)   . HLD (hyperlipidemia)   . Hypertension    last pregnancy  . Ischemic cardiomyopathy   . STEMI (ST elevation myocardial infarction) ([facility]) 09/2019   SCAD with aneurysmal dilation of proximal LAD.  . Vaginal Pap smear, abnormal    when she was 34yo  . Ventricular fibrillation ([facility]) 09/23/2019    Past Surgical History:  Procedure Laterality Date  . IR GASTROSTOMY TUBE MOD SED  10/07/2019  . LEFT HEART CATH AND CORONARY ANGIOGRAPHY N/A 09/23/2019   Procedure: LEFT HEART CATH AND CORONARY ANGIOGRAPHY;  Surgeon: Yvonne Kendall, MD;  Location: [facility] IN[facility];  Service: Cardiovascular;  Laterality: N/A;    There were no vitals filed for this visit.   Subjective Assessment - 01/15/20 1621    Subjective "Confused a little" (about her shoes)    Patient is accompained by: Family member   mom [name]   Currently in Pain? No/denies                 ADULT SLP TREATMENT - 01/15/20 1300      General Information   Behavior/Cognition Alert;Confused;Distractible;Requires cueing;Decreased sustained attention     HPI Pt is a 34 y.o. female with PMH of HTN who was admitted 09/23/19 2 weeks post partum after emergent C section due to rupture of membranes with VF arrest. Pt with enterobacter PNA and encephalopathy, trach placed 8/30 and PEG on 8/31. MRI 8/31: No evidence of anoxic brain injury. Pt changed to #4 cuffless trach on 9/15; capped on 9/21 and decannulated on 9/25. MBS 9/22, POs started - primary oral dysphagia.   Pt currently using PEG as primary nutrition and hydration and for some meds, due to behavior.      Treatment Provided   Treatment provided Dysphagia;Cognitive-Linquistic      Dysphagia Treatment   Temperature Spikes Noted No    Treatment Methods Skilled observation;Compensation strategy training;Patient/caregiver education    Patient observed directly with PO's Yes    Type of PO's observed Dysphagia 3 (soft);Dysphagia 1 (puree);Thin liquids    Feeding Needs assist    Liquids provided via Teaspoon;Cup;Straw    Oral Phase Signs & Symptoms Oral holding    Pharyngeal Phase Signs & Symptoms Other (comment)   none   Type of cueing Verbal;Tactile;Visual    Amount of cueing Maximal    Other treatment/comments DYSPHAGIA TX: (16 min) Mother reports frustration re; Vicy's oral intake. Feeding/cuing strategy training: Clinician fed bites of  puree, teaspoons and straw sips of water, and softened graham cracker in applesauce. Placement of straw more posteriorly on pt's tongue resulted in improved bolus uptake; rare cues required for swallow initiation with liquids; she consumed approximately 2 oz water in this manner. With puree, pt held bolus on her tongue. Most effective cues to prompt swallow initiation were use of mirror for visual feedback, dry spoon/straw presentation. No overt signs of aspiration with any consistency.      Pain Assessment   Pain Assessment No/denies pain      Cognitive-Linquistic Treatment   Treatment focused on Dysarthria;Patient/family/caregiver education    Skilled  Treatment DYSARTHRIA tx: (39 min) Primary focus of today's session was speech intelligibility, while pt's attention and reasoning skills were targeted indirectly. Mod-max cues required initially to slow rate in automatic speech tasks (counting, months); pt followed SLP cue of tap on table for rate 60% accuracy, progressing to 85% accuracy by end of tasks. Named items in categories with occasional mod cues for rate. Time-telling (occasional mod cues for impulsivity) 80% accurate. Recall of 2 words after 20 second distraction 90% accuracy, usual mod-max cues for slower rate.      Assessment / Recommendations / Plan   Plan Continue with current plan of care      Progression Toward Goals   Progression toward goals Progressing toward goals            SLP Education - 01/15/20 1703    Education Details cues/feeding strategies    Person(s) Educated Patient;Parent(s)    Methods Explanation;Demonstration;Verbal cues    Comprehension Verbalized understanding;Need further instruction            SLP Short Term Goals - 01/08/20 0022      SLP SHORT TERM GOAL #1   Title Tanise will sustain attention for 5 minutes to simple cognitive task with occasional min A over 3 sessions    Baseline 1-2 minutes    Time 5    Period Weeks    Status On-going      SLP SHORT TERM GOAL #2   Title Pt will verbalize 2 safety rules for walking at home with occasional min A over 3 sessions    Baseline pt states she can walk by herself    Time 5    Period Weeks    Status On-going      SLP SHORT TERM GOAL #3   Title Pt will use simple external aid to follow simple schedule and prepare for appointments with usual min A from family    Baseline does not recall or prepare for appointments    Time 5    Period Weeks    Status On-going      SLP SHORT TERM GOAL #4   Title Pt will use visual and written word cues to wash her face with usual min verbal cues from mom over 3 sessions    Baseline Per mom, pt only touches  her face 2-3 times when asked to wash her face    Time 5    Period Weeks    Status On-going            SLP Long Term Goals - 01/08/20 0023      SLP LONG TERM GOAL #1   Title Pt will use external aids/to do list to complete 3 simple chores a day with occasional min A over 3 sessions    Time 11    Period Weeks    Status On-going  SLP LONG TERM GOAL #2   Title Pt will sustain attention for 10 minutes to simple cognitive task with 4 or less re-directions over 3 sessions    Baseline sustains attention for 1-2 minutes    Time 11    Period Weeks    Status On-going      SLP LONG TERM GOAL #3   Title Pt will use visual cues and schedule to eat 2 meals a day and drink 24 oz of liquid with usual min A over 3 sessions    Baseline pt eating less than 1 meal a day and taking only sips (PEG is used)    Time 11    Period Weeks    Status On-going      SLP LONG TERM GOAL #4   Title Pt will use reduced rate to be intellgible in 5 minute conversation with occasional min A    Baseline Rapid rate, rushes of speech, 35% of utterances unintelligible    Time 11    Period Weeks    Status On-going            Plan - 01/15/20 1704    Clinical Impression Statement Continues to use PEG for nutrition/hydration and some days does not take any food or drink by mouth; her dysphagia remains cognitively-based. When allowed to feed herself, Hani holds food and liquids orally and pockets her food. She did respond to use of a mirror as well as dry spoon cues to prompt swallow initiation. Responsive to visual and tactile cues today for speech intelligibility in simple language tasks. Sustained attention to task today was 2-3 minutes. Continue skilled ST to maximize cognition and intelligibility for safety, to reduce caregiver burden.    Speech Therapy Frequency 2x / week    Duration 12 weeks   or 25 visits   Treatment/Interventions Language facilitation;Environmental controls;Cueing hierarchy;SLP  instruction and feedback;Aspiration precaution training;Compensatory techniques;Cognitive reorganization;Functional tasks;Compensatory strategies;Diet toleration management by SLP;Trials of upgraded texture/liquids;Internal/external aids;Multimodal communcation approach;Patient/family education    Potential to Achieve Goals Fair    Potential Considerations Severity of impairments;Financial resources           Patient will benefit from skilled therapeutic intervention in order to improve the following deficits and impairments:   Dysarthria and anarthria  Dysphagia, oropharyngeal phase  Cognitive communication deficit    Problem List Patient Active Problem List   Diagnosis Date Noted  . Coronary artery disease involving native coronary artery of native heart without angina pectoris 12/12/2019  . Ischemic cardiomyopathy 12/12/2019  . Brain injury ([facility])   . Prediabetes   . Anoxic brain injury ([facility]) 11/05/2019  . Dysphagia 11/05/2019  . Physical deconditioning 11/05/2019  . Acute delirium 11/05/2019  . Respiratory failure ([facility])   . Acute metabolic encephalopathy   . Encounter for central line placement   . Ventricular fibrillation ([facility]) 09/23/2019  . Acute combined systolic and diastolic heart failure ([facility]) 09/23/2019  . Ventricular tachycardia, polymorphic ([facility]) 09/23/2019  . Pelvic pain affecting pregnancy 10/15/2015  . Supervision of normal pregnancy in third trimester 09/28/2015  . Poor weight gain of pregnancy 09/28/2015  . Iron deficiency anemia of pregnancy 09/01/2015  . Increased BMI (body mass index) 07/29/2015   Rondel Baton, MS, CCC-SLP Speech-Language Pathologist  Arlana Lindau 01/15/2020, 5:06 PM  [facility] MAIN [facility] SERVICES [address] [place], [place], [address] Phone: [phone]   Fax:  [phone]   Name: JALA DUNDON MRN: [id number] Date of Birth: [birth year]

## 2020-01-16 ENCOUNTER — Encounter: Payer: Self-pay | Admitting: Occupational Therapy

## 2020-01-16 NOTE — Therapy (Signed)
[place] [facility] MAIN [facility] SERVICES [address] [place], [place], [address] Phone: [phone]   Fax:  [phone]  Occupational Therapy Treatment  Patient Details  Name: Beverly [name] MRN: [id number] Date of Birth: Sep 06, 1985 Referring Provider (OT): Candelaria Stagers MD   Encounter Date: 01/15/2020   OT End of Session - 01/16/20 2043    Visit Number 7    Number of Visits 24    Date for OT Re-Evaluation 03/16/20    OT Start Time 1350    OT Stop Time 1435    OT Time Calculation (min) 45 min    Activity Tolerance Patient tolerated treatment well    Behavior During Therapy Impulsive           Past Medical History:  Diagnosis Date  . Brain injury (HCC)   . CAD (coronary artery disease)   . HLD (hyperlipidemia)   . Hypertension    last pregnancy  . Ischemic cardiomyopathy   . STEMI (ST elevation myocardial infarction) (HCC) 09/2019   SCAD with aneurysmal dilation of proximal LAD.  . Vaginal Pap smear, abnormal    when she was 34yo  . Ventricular fibrillation (HCC) 09/23/2019    Past Surgical History:  Procedure Laterality Date  . IR GASTROSTOMY TUBE MOD SED  10/07/2019  . LEFT HEART CATH AND CORONARY ANGIOGRAPHY N/A 09/23/2019   Procedure: LEFT HEART CATH AND CORONARY ANGIOGRAPHY;  Surgeon: Yvonne Kendall, MD;  Location: [facility] INVASIVE CV LAB;  Service: Cardiovascular;  Laterality: N/A;    There were no vitals filed for this visit.   Subjective Assessment - 01/16/20 2042    Subjective  Pt. was present with her mother    Patient is accompanied by: Family member    Pertinent History On 09/23/19 patient presented to ED 2 weeks postpartum (had emergent C-Section) with chest pain. In ED sustained VFib Cardiac Arrest requiring intubation. EKG showed ST elevation, Cath Lab showed aneurysmal dissection of the LAD was identified. Course complicated by encephalopathy, anoxic brain injury, enterobacterial PNAPMH: HTN    Currently in Pain? No/denies           OT TREATMENT    Neuro muscular re-education:  Pt. worked on bilateral motor control skills grasping, and stacking [place] style discs. Pt. worked on stabilizing the bottom disc with the left hand, while stacking it with the right. A nonskid resistive mat was utilized for additional stability.   Self-care:   Pt. Worked on Mining engineer formulating the letters of her name with her dominant left hand. Pt. Required the use of a 1"red built-up foam handle on a pen. Pt. required cues, and assist to reposition the the pen in her hand.  Pt./caregiver education was provided about the [facility]. Pt. presents with impaired bilateral UE motor control, and coordination skills. Pt. requires assist for stabilization distally during tabletop coordination tasks. Pt. Required assist to reposition a large handled adaptive pen in her left hand during writing. Pt. Was able to formulate the letters S, T, O, Y legibly forming the letters large, with impaired spacing. Pt. was unable to formulate H, A, and had difficulty completing O's. Pt. was provided with dycem, and a 1" foam adaptive handle.                           OT Short Term Goals - 01/07/20 1308      OT SHORT TERM GOAL #1   Title Pt/ caregiver  will be I with inital HEP.    Baseline dependent    Time 6    Period Weeks    Status On-going    Target Date 02/03/20      OT SHORT TERM GOAL #2   Title Pt will perform UB dressing consistently with supervision/ set up and min v.c    Baseline min A    Time 6    Period Weeks    Status Achieved      OT SHORT TERM GOAL #3   Title Pt will demonstrate sustained attention to a simple functional task for 2 mins, in a minimally distracting environment.    Baseline 1 min with re-direction    Time 6    Period Weeks    Status Achieved      OT SHORT TERM GOAL #4   Title Pt will wash her face and brush her teeth with no more than min v.c for thoroughness     Baseline mod A    Time 6    Period Weeks    Status On-going      OT SHORT TERM GOAL #5   Title Pt will demonstrate improved bilateral functional use as evidenced by increasing box/blocks score by 3 blocks    Baseline RUE10 blocks, LUE 12 blocks    Time 6    Period Weeks    Status On-going      OT SHORT TERM GOAL #6   Title Pt will demonstrate ability to sequence a simple functional or ADL task with no more than min v.c    Baseline Pt requires grossly mod A with sequencing simple activities    Time 6    Period Weeks    Status On-going             OT Long Term Goals - 12/31/19 1713      OT LONG TERM GOAL #1   Title Pt will perform LB bathing and dressing with no more than min A.    Baseline max A    Time 12    Period Weeks    Status On-going      OT LONG TERM GOAL #2   Title Pt will demonstrate improved bilateral UE functional use by increasing box/ blocks score by 6 blocks from inital measurement.    Baseline RUE 10 blocks, LUE 12 blocks    Time 12    Period Weeks    Status On-going      OT LONG TERM GOAL #3   Title Pt will perform simple cold snack prep with min A.    Baseline dependent    Time 12    Period Weeks    Status On-going      OT LONG TERM GOAL #4   Title Pt will attend to an ADL/ simple functional familiar task for 10 mins.    Baseline attends for grossly 1 min with re-direct.    Time 12    Period Weeks    Status On-going      OT LONG TERM GOAL #5   Title Pt perform a simple home management task with no more than min A, demonstrating good safety awareness.    Baseline dependent    Time 12    Period Weeks    Status On-going      OT LONG TERM GOAL #6   Title Pt will write her name with 75% legibility    Baseline handwriting is illegibile    Time 12  Period Weeks    Status On-going                 Plan - 01/16/20 2044    Clinical Impression Statement Pt./caregiver education was provided about the [facility]. Pt.  presents with impaired bilateral UE motor control, and coordination skills. Pt. requires assist for stabilization distally during tabletop coordination tasks. Pt. Required assist to reposition a large handled adaptive pen in her left hand during writing. Pt. Was able to formulate the letters S, T, O, Y legibly forming the letters large, with impaired spacing. Pt. was unable to formulate H, A, and had difficulty completing O's. Pt. was provided with dycem, and a 1" foam adaptive handle.    OT Occupational Profile and History Detailed Assessment- Review of Records and additional review of physical, cognitive, psychosocial history related to current functional performance    Occupational performance deficits (Please refer to evaluation for details): ADL's;IADL's;Work;Leisure;Social Participation    Body Structure / Function / Physical Skills ADL;Endurance;UE functional use;Balance;Pain;Flexibility;FMC;ROM;GMC;Coordination;Decreased knowledge of precautions;Decreased knowledge of use of DME;IADL;Dexterity;Strength;Mobility;Tone;Vision;Sensation;Gait    Cognitive Skills Attention;Energy/Drive;Memory;Orientation;Problem Solve;Safety Awareness;Thought;Understand    Rehab Potential Good    Clinical Decision Making Several treatment options, min-mod task modification necessary    Comorbidities Affecting Occupational Performance: May have comorbidities impacting occupational performance    Modification or Assistance to Complete Evaluation  Min-Moderate modification of tasks or assist with assess necessary to complete eval    OT Frequency 2x / week    OT Duration 12 weeks    OT Treatment/Interventions Self-care/ADL training;Ultrasound;Visual/perceptual remediation/compensation;Patient/family education;Scar mobilization;Aquatic Therapy;Paraffin;Passive range of motion;Balance training;Dry needling;Stair Training;Fluidtherapy;Cryotherapy;Splinting;Moist Heat;Manual Therapy;Therapeutic exercise;Therapeutic  activities;Functional Mobility Training;Neuromuscular education;Cognitive remediation/compensation    Plan ADL strategies, sitting balance and trunk control    Consulted and Agree with Plan of Care Patient;Family member/caregiver           Patient will benefit from skilled therapeutic intervention in order to improve the following deficits and impairments:   Body Structure / Function / Physical Skills: ADL,Endurance,UE functional use,Balance,Pain,Flexibility,FMC,ROM,GMC,Coordination,Decreased knowledge of precautions,Decreased knowledge of use of DME,IADL,Dexterity,Strength,Mobility,Tone,Vision,Sensation,Gait Cognitive Skills: Attention,Energy/Drive,Memory,Orientation,Problem Solve,Safety Awareness,Thought,Understand     Visit Diagnosis: Muscle weakness (generalized)  Other lack of coordination    Problem List Patient Active Problem List   Diagnosis Date Noted  . Coronary artery disease involving native coronary artery of native heart without angina pectoris 12/12/2019  . Ischemic cardiomyopathy 12/12/2019  . Brain injury (HCC)   . Prediabetes   . Anoxic brain injury (HCC) 11/05/2019  . Dysphagia 11/05/2019  . Physical deconditioning 11/05/2019  . Acute delirium 11/05/2019  . Respiratory failure (HCC)   . Acute metabolic encephalopathy   . Encounter for central line placement   . Ventricular fibrillation (HCC) 09/23/2019  . Acute combined systolic and diastolic heart failure (HCC) 09/23/2019  . Ventricular tachycardia, polymorphic (HCC) 09/23/2019  . Pelvic pain affecting pregnancy 10/15/2015  . Supervision of normal pregnancy in third trimester 09/28/2015  . Poor weight gain of pregnancy 09/28/2015  . Iron deficiency anemia of pregnancy 09/01/2015  . Increased BMI (body mass index) 07/29/2015    Olegario Messier, MS, OTR/L 01/16/2020, 8:47 PM  [place] [facility] MAIN [facility] SERVICES [address] [place], [place], [address] Phone:  [phone]   Fax:  [phone]  Name: Beverly [name] MRN: [id number] Date of Birth: 23-Dec-1985

## 2020-01-20 ENCOUNTER — Encounter: Payer: Self-pay | Admitting: Occupational Therapy

## 2020-01-20 ENCOUNTER — Ambulatory Visit: Payer: Medicaid Other | Admitting: Occupational Therapy

## 2020-01-20 ENCOUNTER — Other Ambulatory Visit: Payer: Self-pay

## 2020-01-20 ENCOUNTER — Ambulatory Visit: Payer: Medicaid Other

## 2020-01-20 ENCOUNTER — Ambulatory Visit: Payer: Medicaid Other | Admitting: Speech Pathology

## 2020-01-20 DIAGNOSIS — M6281 Muscle weakness (generalized): Secondary | ICD-10-CM

## 2020-01-20 DIAGNOSIS — R278 Other lack of coordination: Secondary | ICD-10-CM

## 2020-01-20 DIAGNOSIS — G931 Anoxic brain damage, not elsewhere classified: Secondary | ICD-10-CM

## 2020-01-20 DIAGNOSIS — R2681 Unsteadiness on feet: Secondary | ICD-10-CM

## 2020-01-20 DIAGNOSIS — R41841 Cognitive communication deficit: Secondary | ICD-10-CM | POA: Diagnosis not present

## 2020-01-20 DIAGNOSIS — R2689 Other abnormalities of gait and mobility: Secondary | ICD-10-CM

## 2020-01-20 NOTE — Therapy (Signed)
[place] [facility] MAIN [facility] SERVICES [address] [place], [place], [address] Phone: [phone]   Fax:  [phone]  Occupational Therapy Treatment  Patient Details  Name: Beverly [name] MRN: [id number] Date of Birth: November 08, 1985 Referring Provider (OT): Candelaria Stagers MD   Encounter Date: 01/20/2020   OT End of Session - 01/20/20 1555    Visit Number 8    Number of Visits 24    Date for OT Re-Evaluation 03/16/20    Authorization Type Medicaid OON    OT Start Time 1430    OT Stop Time 1515    OT Time Calculation (min) 45 min    Activity Tolerance Patient tolerated treatment well    Behavior During Therapy Impulsive           Past Medical History:  Diagnosis Date  . Brain injury (HCC)   . CAD (coronary artery disease)   . HLD (hyperlipidemia)   . Hypertension    last pregnancy  . Ischemic cardiomyopathy   . STEMI (ST elevation myocardial infarction) (HCC) 09/2019   SCAD with aneurysmal dilation of proximal LAD.  . Vaginal Pap smear, abnormal    when she was 34yo  . Ventricular fibrillation (HCC) 09/23/2019    Past Surgical History:  Procedure Laterality Date  . IR GASTROSTOMY TUBE MOD SED  10/07/2019  . LEFT HEART CATH AND CORONARY ANGIOGRAPHY N/A 09/23/2019   Procedure: LEFT HEART CATH AND CORONARY ANGIOGRAPHY;  Surgeon: Yvonne Kendall, MD;  Location: [facility] INVASIVE CV LAB;  Service: Cardiovascular;  Laterality: N/A;    There were no vitals filed for this visit.   Subjective Assessment - 01/20/20 1555    Subjective  Pt. was present with her mother    Patient is accompanied by: Family member    Pertinent History On 09/23/19 patient presented to ED 2 weeks postpartum (had emergent C-Section) with chest pain. In ED sustained VFib Cardiac Arrest requiring intubation. EKG showed ST elevation, Cath Lab showed aneurysmal dissection of the LAD was identified. Course complicated by encephalopathy, anoxic brain injury, enterobacterial PNAPMH:  HTN    Currently in Pain? No/denies          OT TREATMENT    Neuro muscular re-education:  Pt. worked on bilateral fine motor control skills grasping multiple sizes of coins, and pushing them through a resistive container. Pt. worked on moving the coins from the left to the right hands. Pt. worked on Sports coach discs, and placing them in horizontal, and vertical rows using a Connect 4 board, after shifting the objects between her left, and right hands.   Pt. continues to present with impaired bilateral [facility] skills, with decreasing motor control when attempting to perform [facility] tasks, and during bilateral hand coordination tasks shifting objects from one hand into the other. Pt./caregiver education was provided about proximal stabilization for improved distal motor control.  Pt. continues to work on improving bilateral motor control, [facility] skills, and bilateral hand coordination tasks in order to work towards improving, and maximizing independence with ADLs, and IADLs.                             OT Education - 01/20/20 1555    Education Details FMC, bilateral hand coordination    Person(s) Educated Patient;Parent(s)    Methods Explanation    Comprehension Need further instruction            OT Short Term Goals -  01/07/20 1308      OT SHORT TERM GOAL #1   Title Pt/ caregiver will be I with inital HEP.    Baseline dependent    Time 6    Period Weeks    Status On-going    Target Date 02/03/20      OT SHORT TERM GOAL #2   Title Pt will perform UB dressing consistently with supervision/ set up and min v.c    Baseline min A    Time 6    Period Weeks    Status Achieved      OT SHORT TERM GOAL #3   Title Pt will demonstrate sustained attention to a simple functional task for 2 mins, in a minimally distracting environment.    Baseline 1 min with re-direction    Time 6    Period Weeks    Status Achieved      OT SHORT TERM GOAL #4    Title Pt will wash her face and brush her teeth with no more than min v.c for thoroughness    Baseline mod A    Time 6    Period Weeks    Status On-going      OT SHORT TERM GOAL #5   Title Pt will demonstrate improved bilateral functional use as evidenced by increasing box/blocks score by 3 blocks    Baseline RUE10 blocks, LUE 12 blocks    Time 6    Period Weeks    Status On-going      OT SHORT TERM GOAL #6   Title Pt will demonstrate ability to sequence a simple functional or ADL task with no more than min v.c    Baseline Pt requires grossly mod A with sequencing simple activities    Time 6    Period Weeks    Status On-going             OT Long Term Goals - 12/31/19 1713      OT LONG TERM GOAL #1   Title Pt will perform LB bathing and dressing with no more than min A.    Baseline max A    Time 12    Period Weeks    Status On-going      OT LONG TERM GOAL #2   Title Pt will demonstrate improved bilateral UE functional use by increasing box/ blocks score by 6 blocks from inital measurement.    Baseline RUE 10 blocks, LUE 12 blocks    Time 12    Period Weeks    Status On-going      OT LONG TERM GOAL #3   Title Pt will perform simple cold snack prep with min A.    Baseline dependent    Time 12    Period Weeks    Status On-going      OT LONG TERM GOAL #4   Title Pt will attend to an ADL/ simple functional familiar task for 10 mins.    Baseline attends for grossly 1 min with re-direct.    Time 12    Period Weeks    Status On-going      OT LONG TERM GOAL #5   Title Pt perform a simple home management task with no more than min A, demonstrating good safety awareness.    Baseline dependent    Time 12    Period Weeks    Status On-going      OT LONG TERM GOAL #6   Title Pt will write her name  with 75% legibility    Baseline handwriting is illegibile    Time 12    Period Weeks    Status On-going                 Plan - 01/20/20 1556    Clinical  Impression Statement Pt. continues to present with impaired bilateral [facility] skills, with decreasing motor control when attempting to perform [facility] tasks, and during bilateral hand coordination tasks shifting objects from one hand into the other. Pt./caregiver education was provided about proximal stabilization for improved distal motor control.  Pt. continues to work on improving bilateral motor control, [facility] skills, and bilateral hand coordination tasks in order to work towards improving bilateral hand function skills, and maximizing independence with ADLs, and IADLs.    OT Occupational Profile and History Detailed Assessment- Review of Records and additional review of physical, cognitive, psychosocial history related to current functional performance    Occupational performance deficits (Please refer to evaluation for details): ADL's;IADL's;Work;Leisure;Social Participation    Body Structure / Function / Physical Skills ADL;Endurance;UE functional use;Balance;Pain;Flexibility;FMC;ROM;GMC;Coordination;Decreased knowledge of precautions;Decreased knowledge of use of DME;IADL;Dexterity;Strength;Mobility;Tone;Vision;Sensation;Gait    Cognitive Skills Attention;Energy/Drive;Memory;Orientation;Problem Solve;Safety Awareness;Thought;Understand    Rehab Potential Good    Clinical Decision Making Several treatment options, min-mod task modification necessary    Comorbidities Affecting Occupational Performance: May have comorbidities impacting occupational performance    Modification or Assistance to Complete Evaluation  Min-Moderate modification of tasks or assist with assess necessary to complete eval    OT Frequency 2x / week    OT Duration 12 weeks    OT Treatment/Interventions Self-care/ADL training;Ultrasound;Visual/perceptual remediation/compensation;Patient/family education;Scar mobilization;Aquatic Therapy;Paraffin;Passive range of motion;Balance training;Dry needling;Stair  Training;Fluidtherapy;Cryotherapy;Splinting;Moist Heat;Manual Therapy;Therapeutic exercise;Therapeutic activities;Functional Mobility Training;Neuromuscular education;Cognitive remediation/compensation    Consulted and Agree with Plan of Care Patient;Family member/caregiver           Patient will benefit from skilled therapeutic intervention in order to improve the following deficits and impairments:   Body Structure / Function / Physical Skills: ADL,Endurance,UE functional use,Balance,Pain,Flexibility,FMC,ROM,GMC,Coordination,Decreased knowledge of precautions,Decreased knowledge of use of DME,IADL,Dexterity,Strength,Mobility,Tone,Vision,Sensation,Gait Cognitive Skills: Attention,Energy/Drive,Memory,Orientation,Problem Solve,Safety Awareness,Thought,Understand     Visit Diagnosis: Muscle weakness (generalized)  Other lack of coordination    Problem List Patient Active Problem List   Diagnosis Date Noted  . Coronary artery disease involving native coronary artery of native heart without angina pectoris 12/12/2019  . Ischemic cardiomyopathy 12/12/2019  . Brain injury (HCC)   . Prediabetes   . Anoxic brain injury (HCC) 11/05/2019  . Dysphagia 11/05/2019  . Physical deconditioning 11/05/2019  . Acute delirium 11/05/2019  . Respiratory failure (HCC)   . Acute metabolic encephalopathy   . Encounter for central line placement   . Ventricular fibrillation (HCC) 09/23/2019  . Acute combined systolic and diastolic heart failure (HCC) 09/23/2019  . Ventricular tachycardia, polymorphic (HCC) 09/23/2019  . Pelvic pain affecting pregnancy 10/15/2015  . Supervision of normal pregnancy in third trimester 09/28/2015  . Poor weight gain of pregnancy 09/28/2015  . Iron deficiency anemia of pregnancy 09/01/2015  . Increased BMI (body mass index) 07/29/2015    Olegario Messier, MS, OTR/L 01/20/2020, 3:58 PM  [place] [facility] MAIN [facility] SERVICES [address] [place], [place], [address] Phone: [phone]   Fax:  [phone]  Name: Beverly [name] MRN: [id number] Date of Birth: 06/03/1985

## 2020-01-20 NOTE — Therapy (Signed)
[place] MAIN [facility] SERVICES [address] [place], [place], [address] Phone: [phone]   Fax:  [phone]  Physical Therapy Treatment Physical Therapy Progress Note   Dates of reporting period  12/10/19  to   01/20/20  Patient Details  Name: Beverly [name] MRN: [id number] Date of Birth: [birth year] Referring Provider (PT): Mercy Riding, MD   Encounter Date: 01/20/2020   PT End of Session - 01/20/20 1426    Visit Number 10    Number of Visits 17    Date for PT Re-Evaluation 03/09/20    PT Start Time 0315    Equipment Utilized During Treatment Gait belt    Activity Tolerance Patient tolerated treatment well    Behavior During Therapy Impulsive           Past Medical History:  Diagnosis Date  . Brain injury ([facility])   . CAD (coronary artery disease)   . HLD (hyperlipidemia)   . Hypertension    last pregnancy  . Ischemic cardiomyopathy   . STEMI (ST elevation myocardial infarction) ([place]) 09/2019   SCAD with aneurysmal dilation of proximal LAD.  . Vaginal Pap smear, abnormal    when she was 34yo  . Ventricular fibrillation ([place]) 09/23/2019    Past Surgical History:  Procedure Laterality Date  . IR GASTROSTOMY TUBE MOD SED  10/07/2019  . LEFT HEART CATH AND CORONARY ANGIOGRAPHY N/A 09/23/2019   Procedure: LEFT HEART CATH AND CORONARY ANGIOGRAPHY;  Surgeon: Nelva Bush, MD;  Location: [facility];  Service: Cardiovascular;  Laterality: N/A;    There were no vitals filed for this visit.   Subjective Assessment - 01/20/20 1541    Subjective The patient reports doing good today, patient's caregiver said she was able to stand one day for 9 mins.    Patient is accompained by: Family member   mom   Pertinent History HTN    Limitations Sitting;Standing;Walking;House hold activities    Patient Stated Goals walk better    Currently in Pain? No/denies    Pain Score 0-No pain            Intervention:  Ambulation  175 ft with PT hand held assist in front and wheelchair follow Bed mobility- supine to SL to sit with verbal/contact cueing and min A/CGA  Seated on green pad with balloon hit multi directional  Seated LAQ/Marching x10 each bilaterally 2# cueing for posture Sit to stands x5  Stating standing 3 mins CGA  Side stepping in bars without UE support CGA                 PT Short Term Goals - 01/20/20 1542      PT SHORT TERM GOAL #1   Title Patient/caregiver will report independence with HEP with caregiver assistance (All STGs Due: 01/07/2020)    Baseline reports independence; only completing 1-2x/week per mom reports 01/20/20  patient's caregiver assisting patient with HEP.    Time 4    Period Weeks    Status On-going    Target Date 01/07/20      PT SHORT TERM GOAL #2   Title Patient will demo ability to complete transfer from w/c <> mat with CGA with LRAD to demonstrate improved functional mobility    Baseline intermittent CGA- Min A, 01/20/20  continues with intermittent need to transfer from WC<> mat no AD CGA    Time 4    Period Weeks    Status Partially Met  Target Date 01/07/20      PT SHORT TERM GOAL #3   Title Patient will demo ability to ambulate >/= 200 ft  with LRAD or hand hand assist with moderate assistance to demonstrate improved functional mobility in home.    Baseline 115 ft max assist +2 hand held assist., 01/20/20  pt ambulating 175 ft with PT hand had assist in front of patient, no AD.    Time 4    Period Weeks    Status Partially Met             PT Long Term Goals - 01/20/20 1556      PT LONG TERM GOAL #1   Title Patient/caregiver will report indepence with final HEP with caregiver assistance (All LTGS: 02/04/2020)    Baseline no HEP established, 01/20/20  caregiver assisting PT with home program    Time 8    Period Weeks    Status On-going      PT LONG TERM GOAL #2   Title Patient will demo ability to complete all bed mobility Mod I to  demonstrate improved independence    Baseline CGA/Supervision, 01/20/20 supervision, intermitten mod I to perform bed mobility    Time 8    Period Weeks    Status Partially Met      PT LONG TERM GOAL #3   Title Patient will demo ability to complete sit <> stand x 5 reps with supervision to demonstrate improved balance and functional mobility    Baseline CGA - Min A, 01/20/20  intermittent min A to perform sit to stand, sits from a standing position. CGA for safety    Time 8    Period Weeks    Status Partially Met      PT LONG TERM GOAL #4   Title Patient will demo ability to ambulate > 400 ft w/ LRAD versus hand held assist min assist to demonstrate improved household mobility and short community distances.    Baseline TBA, 01/20/20  166f with PT in front of patient, hand held assist, no AD    Time 8    Period Weeks    Status Partially Met                 Plan - 01/20/20 1434    Clinical Impression Statement The patient has made progress towards goals.  The patient continues to be primarily in a wheelchair for ambulation however is able to walk 175 ft with PT hand held assist in front of patient.  The patient is able to transfer from supine to sit with some mod I and can perform sit to stand with intermittent min A/CGA  Patient's condition has the potential to improve in response to therapy. Maximum improvement is yet to be obtained. The anticipated improvement is attainable and reasonable in a generally predictable time.    Personal Factors and Comorbidities Comorbidity 1;Behavior Pattern;Transportation    Comorbidities HTN    Examination-Activity Limitations Bed Mobility;Dressing;Hygiene/Grooming;Caring for Others;Stairs;Stand;Toileting;Transfers;Sit;Bathing    Examination-Participation Restrictions Occupation;Community Activity;Medication Management;Cleaning    Stability/Clinical Decision Making Evolving/Moderate complexity    Rehab Potential Good    PT Frequency 2x / week     PT Duration 8 weeks    PT Treatment/Interventions ADLs/Self Care Home Management;Aquatic Therapy;Electrical Stimulation;DME Instruction;Gait training;Stair training;Functional mobility training;Therapeutic activities;Therapeutic exercise;Balance training;Neuromuscular re-education;Wheelchair mobility training;Patient/family education;Orthotic Fit/Training;Manual techniques;Passive range of motion    PT Next Visit Plan FYI- Pt does have feeding tube still. Pt is impulsive. Continue to work on seated  and standing balance, BLE strengthening, coordination activities, core strengthening. Gait training with hand held assist of 2 people. Did well with 1 person in front and 1 in back with decreasing support today.    Consulted and Agree with Plan of Care Patient;Family member/caregiver    Family Member Consulted Mom/Dad           Patient will benefit from skilled therapeutic intervention in order to improve the following deficits and impairments:  Abnormal gait,Decreased balance,Decreased endurance,Decreased mobility,Difficulty walking,Impaired tone,Impaired sensation,Pain,Impaired UE functional use,Decreased strength,Decreased safety awareness,Decreased knowledge of use of DME,Decreased coordination,Decreased activity tolerance,Decreased cognition,Decreased range of motion  Visit Diagnosis: Anoxic brain injury ([place])  Muscle weakness (generalized)  Other abnormalities of gait and mobility  Unsteadiness on feet     Problem List Patient Active Problem List   Diagnosis Date Noted  . Coronary artery disease involving native coronary artery of native heart without angina pectoris 12/12/2019  . Ischemic cardiomyopathy 12/12/2019  . Brain injury ([place])   . Prediabetes   . Anoxic brain injury ([place]) 11/05/2019  . Dysphagia 11/05/2019  . Physical deconditioning 11/05/2019  . Acute delirium 11/05/2019  . Respiratory failure ([place])   . Acute metabolic encephalopathy   . Encounter for central line  placement   . Ventricular fibrillation ([place]) 09/23/2019  . Acute combined systolic and diastolic heart failure ([place]) 09/23/2019  . Ventricular tachycardia, polymorphic ([place]) 09/23/2019  . Pelvic pain affecting pregnancy 10/15/2015  . Supervision of normal pregnancy in third trimester 09/28/2015  . Poor weight gain of pregnancy 09/28/2015  . Iron deficiency anemia of pregnancy 09/01/2015  . Increased BMI (body mass index) 07/29/2015    Hal Morales PT, DPT 01/20/2020, 4:05 PM  [place] [facility] MAIN [facility] SERVICES [address] [place], [place], [address] Phone: [phone]   Fax:  [phone]  Name: DORIA FERN MRN: [id number] Date of Birth: Feb 15, 1985

## 2020-01-20 NOTE — Therapy (Signed)
[place] [facility] MAIN [facility] SERVICES [address] [place], [place], [address] Phone: [phone]   Fax:  [phone]  Speech Language Pathology Treatment  Patient Details  Name: Beverly [name] MRN: [id number] Date of Birth: 08-26-1985 Referring Provider (SLP): Dr. Candelaria Stagers   Encounter Date: 01/20/2020   End of Session - 01/20/20 1612    Visit Number 6    Number of Visits 25    Date for SLP Re-Evaluation 03/03/20    Authorization Type awaiting medicaid    Authorization Time Period 27 visits approved through 02/06/20    Authorization - Visit Number 20    Authorization - Number of Visits 27    Progress Note Due on Visit 10    SLP Start Time 1335    SLP Stop Time  1427    SLP Time Calculation (min) 52 min    Activity Tolerance Patient tolerated treatment well           Past Medical History:  Diagnosis Date  . Brain injury ([facility])   . CAD (coronary artery disease)   . HLD (hyperlipidemia)   . Hypertension    last pregnancy  . Ischemic cardiomyopathy   . STEMI (ST elevation myocardial infarction) ([facility]) 09/2019   SCAD with aneurysmal dilation of proximal LAD.  . Vaginal Pap smear, abnormal    when she was 34yo  . Ventricular fibrillation ([facility]) 09/23/2019    Past Surgical History:  Procedure Laterality Date  . IR GASTROSTOMY TUBE MOD SED  10/07/2019  . LEFT HEART CATH AND CORONARY ANGIOGRAPHY N/A 09/23/2019   Procedure: LEFT HEART CATH AND CORONARY ANGIOGRAPHY;  Surgeon: Yvonne Kendall, MD;  Location: [facility] INVASIVE CV LAB;  Service: Cardiovascular;  Laterality: N/A;    There were no vitals filed for this visit.   Subjective Assessment - 01/20/20 1602    Subjective Appears tired today    Patient is accompained by: Family member   mom [name]   Currently in Pain? No/denies                 ADULT SLP TREATMENT - 01/20/20 1603      General Information   Behavior/Cognition Alert;Confused;Distractible;Requires  cueing;Decreased sustained attention    HPI Pt is a 34 y.o. female with PMH of HTN who was admitted 09/23/19 2 weeks post partum after emergent C section due to rupture of membranes with VF arrest. Pt with enterobacter PNA and encephalopathy, trach placed 8/30 and PEG on 8/31. MRI 8/31: No evidence of anoxic brain injury. Pt changed to #4 cuffless trach on 9/15; capped on 9/21 and decannulated on 9/25. MBS 9/22, POs started - primary oral dysphagia.   Pt currently using PEG as primary nutrition and hydration and for some meds, due to behavior.      Treatment Provided   Treatment provided Cognitive-Linquistic      Dysphagia Treatment   Other treatment/comments Mom reports using straw at home is helping some with liquid intake. Eating/drinking remains "hit or miss" depending on pt behavior that day      Cognitive-Linquistic Treatment   Treatment focused on Cognition;Patient/family/caregiver education    Skilled Treatment COGNITIVE COMMUNICATION:  Focused session on pt's sustained attention, simple problem solving and delayed recall today. Immediate recall for words (up to 3) and digits (up to 6) highly accurate; 95%. SLP introduced brief distraction (asking pt 1-2 simple questions) and pt's recall decreased to approximately 50%. Using verbal repetition did improve accuracy slightly (60%). Pt less impulsive today; she  did not reach for answers but stated, "I'm not sure" or "I can't remember" rather than guessing ~75% of the time. Liandra sustained attention to simple visual problem solving task for approximately 3 minutes before needing cues to remain engaged. Mod cues to rotate cubes appropriately, or to describe appropriate rotation to clinician when she needed assistance for dexterity.      Assessment / Recommendations / Plan   Plan Continue with current plan of care      Progression Toward Goals   Progression toward goals Progressing toward goals            SLP Education - 01/20/20 1611     Education Details Try feeding [place] foods she likes    Person(s) Educated Patient;Parent(s)    Methods Explanation    Comprehension Verbalized understanding            SLP Short Term Goals - 01/08/20 0022      SLP SHORT TERM GOAL #1   Title Leonore will sustain attention for 5 minutes to simple cognitive task with occasional min A over 3 sessions    Baseline 1-2 minutes    Time 5    Period Weeks    Status On-going      SLP SHORT TERM GOAL #2   Title Pt will verbalize 2 safety rules for walking at home with occasional min A over 3 sessions    Baseline pt states she can walk by herself    Time 5    Period Weeks    Status On-going      SLP SHORT TERM GOAL #3   Title Pt will use simple external aid to follow simple schedule and prepare for appointments with usual min A from family    Baseline does not recall or prepare for appointments    Time 5    Period Weeks    Status On-going      SLP SHORT TERM GOAL #4   Title Pt will use visual and written word cues to wash her face with usual min verbal cues from mom over 3 sessions    Baseline Per mom, pt only touches her face 2-3 times when asked to wash her face    Time 5    Period Weeks    Status On-going            SLP Long Term Goals - 01/08/20 0023      SLP LONG TERM GOAL #1   Title Pt will use external aids/to do list to complete 3 simple chores a day with occasional min A over 3 sessions    Time 11    Period Weeks    Status On-going      SLP LONG TERM GOAL #2   Title Pt will sustain attention for 10 minutes to simple cognitive task with 4 or less re-directions over 3 sessions    Baseline sustains attention for 1-2 minutes    Time 11    Period Weeks    Status On-going      SLP LONG TERM GOAL #3   Title Pt will use visual cues and schedule to eat 2 meals a day and drink 24 oz of liquid with usual min A over 3 sessions    Baseline pt eating less than 1 meal a day and taking only sips (PEG is used)    Time 11     Period Weeks    Status On-going      SLP LONG TERM GOAL #4  Title Pt will use reduced rate to be intellgible in 5 minute conversation with occasional min A    Baseline Rapid rate, rushes of speech, 35% of utterances unintelligible    Time 11    Period Weeks    Status On-going            Plan - 01/20/20 1612    Clinical Impression Statement Tikisha continues with cognitively-based dysphagia as well as severe cognitive communication deficits, dysarthria. Less impulsive today, sustained attention approximately 3 min on average in structured tasks today. Continue skilled ST to maximize cognition and intelligibility for safety, to reduce caregiver burden.    Speech Therapy Frequency 2x / week    Duration 12 weeks   or 25 visits   Treatment/Interventions Language facilitation;Environmental controls;Cueing hierarchy;SLP instruction and feedback;Aspiration precaution training;Compensatory techniques;Cognitive reorganization;Functional tasks;Compensatory strategies;Diet toleration management by SLP;Trials of upgraded texture/liquids;Internal/external aids;Multimodal communcation approach;Patient/family education    Potential to Achieve Goals Fair    Potential Considerations Severity of impairments;Financial resources           Patient will benefit from skilled therapeutic intervention in order to improve the following deficits and impairments:   Cognitive communication deficit    Problem List Patient Active Problem List   Diagnosis Date Noted  . Coronary artery disease involving native coronary artery of native heart without angina pectoris 12/12/2019  . Ischemic cardiomyopathy 12/12/2019  . Brain injury (HCC)   . Prediabetes   . Anoxic brain injury ([facility]) 11/05/2019  . Dysphagia 11/05/2019  . Physical deconditioning 11/05/2019  . Acute delirium 11/05/2019  . Respiratory failure (HCC)   . Acute metabolic encephalopathy   . Encounter for central line placement   . Ventricular  fibrillation (HCC) 09/23/2019  . Acute combined systolic and diastolic heart failure (HCC) 09/23/2019  . Ventricular tachycardia, polymorphic ([facility]) 09/23/2019  . Pelvic pain affecting pregnancy 10/15/2015  . Supervision of normal pregnancy in third trimester 09/28/2015  . Poor weight gain of pregnancy 09/28/2015  . Iron deficiency anemia of pregnancy 09/01/2015  . Increased BMI (body mass index) 07/29/2015   Rondel Baton, MS, CCC-SLP Speech-Language Pathologist  Arlana Lindau 01/20/2020, 4:15 PM  [place] [facility] MAIN [facility] SERVICES [address] [place], [place], [address] Phone: [phone]   Fax:  [phone]   Name: RASHAN PATIENT MRN: [id number] Date of Birth: 1985-11-28

## 2020-01-22 ENCOUNTER — Ambulatory Visit: Payer: Medicaid Other

## 2020-01-22 ENCOUNTER — Encounter: Payer: Medicaid Other | Admitting: Speech Pathology

## 2020-01-22 ENCOUNTER — Encounter: Payer: Medicaid Other | Admitting: Occupational Therapy

## 2020-01-22 ENCOUNTER — Other Ambulatory Visit: Payer: Self-pay

## 2020-01-22 DIAGNOSIS — M6281 Muscle weakness (generalized): Secondary | ICD-10-CM

## 2020-01-22 DIAGNOSIS — R2689 Other abnormalities of gait and mobility: Secondary | ICD-10-CM

## 2020-01-22 DIAGNOSIS — R41841 Cognitive communication deficit: Secondary | ICD-10-CM | POA: Diagnosis not present

## 2020-01-22 DIAGNOSIS — R2681 Unsteadiness on feet: Secondary | ICD-10-CM

## 2020-01-22 DIAGNOSIS — G931 Anoxic brain damage, not elsewhere classified: Secondary | ICD-10-CM

## 2020-01-22 NOTE — Therapy (Signed)
[place] MAIN [facility] SERVICES [address] [place], [place], [address] Phone: [phone]   Fax:  [phone]  Physical Therapy Treatment  Patient Details  Name: Beverly [name] MRN: [id number] Date of Birth: [birth year] Referring Provider (PT): Mercy Riding, MD   Encounter Date: 01/22/2020   PT End of Session - 01/22/20 1516    Visit Number 11    Number of Visits 17    Date for PT Re-Evaluation 03/09/20    PT Start Time 0230    PT Stop Time 0315    PT Time Calculation (min) 45 min    Equipment Utilized During Treatment Gait belt    Activity Tolerance Patient tolerated treatment well    Behavior During Therapy Impulsive           Past Medical History:  Diagnosis Date   Brain injury ([place])    CAD (coronary artery disease)    HLD (hyperlipidemia)    Hypertension    last pregnancy   Ischemic cardiomyopathy    STEMI (ST elevation myocardial infarction) ([place]) 09/2019   SCAD with aneurysmal dilation of proximal LAD.   Vaginal Pap smear, abnormal    when she was 34yo   Ventricular fibrillation ([place]) 09/23/2019    Past Surgical History:  Procedure Laterality Date   IR GASTROSTOMY TUBE MOD SED  10/07/2019   LEFT HEART CATH AND CORONARY ANGIOGRAPHY N/A 09/23/2019   Procedure: LEFT HEART CATH AND CORONARY ANGIOGRAPHY;  Surgeon: Nelva Bush, MD;  Location: [place] [facility];  Service: Cardiovascular;  Laterality: N/A;    There were no vitals filed for this visit.   Subjective Assessment - 01/22/20 1515    Subjective The patient reports doing good.  The patient's parent reports the patient has been walking slightly more independently short distances at home.    Patient is accompained by: Family member   mom   Pertinent History HTN    Limitations Sitting;Standing;Walking;House hold activities    Patient Stated Goals walk better    Currently in Pain? No/denies    Pain Score 0-No pain             Ambulation 245  ft with PT hand held assist in front and wheelchair follow    Seated on green pad with ball toss  Seated LAQ/Marching x20 each bilaterally 2# cueing for posture Sit to stands x5  Static standing 3 mins CGA  Side stepping in bars without UE support CGA F/R walk in // bars CGA no UE support                         PT Education - 01/22/20 1431    Education Details posture, exercise technique    Person(s) Educated Patient;Parent(s)    Methods Explanation    Comprehension Verbalized understanding            PT Short Term Goals - 01/20/20 1542      PT SHORT TERM GOAL #1   Title Patient/caregiver will report independence with HEP with caregiver assistance (All STGs Due: 01/07/2020)    Baseline reports independence; only completing 1-2x/week per mom reports 01/20/20  patient's caregiver assisting patient with HEP.    Time 4    Period Weeks    Status On-going    Target Date 01/07/20      PT SHORT TERM GOAL #2   Title Patient will demo ability to complete transfer from w/c <> mat with CGA with  LRAD to demonstrate improved functional mobility    Baseline intermittent CGA- Min A, 01/20/20  continues with intermittent need to transfer from WC<> mat no AD CGA    Time 4    Period Weeks    Status Partially Met    Target Date 01/07/20      PT SHORT TERM GOAL #3   Title Patient will demo ability to ambulate >/= 200 ft  with LRAD or hand hand assist with moderate assistance to demonstrate improved functional mobility in home.    Baseline 115 ft max assist +2 hand held assist., 01/20/20  pt ambulating 175 ft with PT hand had assist in front of patient, no AD.    Time 4    Period Weeks    Status Partially Met             PT Long Term Goals - 01/20/20 1556      PT LONG TERM GOAL #1   Title Patient/caregiver will report indepence with final HEP with caregiver assistance (All LTGS: 02/04/2020)    Baseline no HEP established, 01/20/20  caregiver assisting PT with  home program    Time 8    Period Weeks    Status On-going      PT LONG TERM GOAL #2   Title Patient will demo ability to complete all bed mobility Mod I to demonstrate improved independence    Baseline CGA/Supervision, 01/20/20 supervision, intermitten mod I to perform bed mobility    Time 8    Period Weeks    Status Partially Met      PT LONG TERM GOAL #3   Title Patient will demo ability to complete sit <> stand x 5 reps with supervision to demonstrate improved balance and functional mobility    Baseline CGA - Min A, 01/20/20  intermittent min A to perform sit to stand, sits from a standing position. CGA for safety    Time 8    Period Weeks    Status Partially Met      PT LONG TERM GOAL #4   Title Patient will demo ability to ambulate > 400 ft w/ LRAD versus hand held assist min assist to demonstrate improved household mobility and short community distances.    Baseline TBA, 01/20/20  131f with PT in front of patient, hand held assist, no AD    Time 8    Period Weeks    Status Partially Met                 Plan - 01/22/20 1517    Clinical Impression Statement The patient able to ambulate 245 ft with CGA and wheelchair follow.  The patient intermittently required mod CGA during ambulation.  Patient requires verbal and tactile cueing for positioning and exercise techniques.  Patient continues ot benefit from additional skilled PT intervention to improve strength of LEs, ability to perform transfers and improve WB tolerances for improved quality of life and less dependence on ADLs.    Personal Factors and Comorbidities Comorbidity 1;Behavior Pattern;Transportation    Comorbidities HTN    Examination-Activity Limitations Bed Mobility;Dressing;Hygiene/Grooming;Caring for Others;Stairs;Stand;Toileting;Transfers;Sit;Bathing    Examination-Participation Restrictions Occupation;Community Activity;Medication Management;Cleaning    Stability/Clinical Decision Making Evolving/Moderate  complexity    Rehab Potential Good    PT Frequency 2x / week    PT Duration 8 weeks    PT Treatment/Interventions ADLs/Self Care Home Management;Aquatic Therapy;Electrical Stimulation;DME Instruction;Gait training;Stair training;Functional mobility training;Therapeutic activities;Therapeutic exercise;Balance training;Neuromuscular re-education;Wheelchair mobility training;Patient/family education;Orthotic Fit/Training;Manual techniques;Passive range of motion  PT Next Visit Plan FYI- Pt does have feeding tube still. Pt is impulsive. Continue to work on seated and standing balance, BLE strengthening, coordination activities, core strengthening. Gait training with hand held assist of 2 people. Did well with 1 person in front and 1 in back with decreasing support today.    Consulted and Agree with Plan of Care Patient;Family member/caregiver    Family Member Consulted Mom/Dad           Patient will benefit from skilled therapeutic intervention in order to improve the following deficits and impairments:  Abnormal gait,Decreased balance,Decreased endurance,Decreased mobility,Difficulty walking,Impaired tone,Impaired sensation,Pain,Impaired UE functional use,Decreased strength,Decreased safety awareness,Decreased knowledge of use of DME,Decreased coordination,Decreased activity tolerance,Decreased cognition,Decreased range of motion  Visit Diagnosis: Muscle weakness (generalized)  Other abnormalities of gait and mobility  Unsteadiness on feet  Anoxic brain injury [facility])     Problem List Patient Active Problem List   Diagnosis Date Noted   Coronary artery disease involving native coronary artery of native heart without angina pectoris 12/12/2019   Ischemic cardiomyopathy 12/12/2019   Brain injury ([employer])    Prediabetes    Anoxic brain injury ([place]) 11/05/2019   Dysphagia 11/05/2019   Physical deconditioning 11/05/2019   Acute delirium 11/05/2019   Respiratory failure ([employer])     Acute metabolic encephalopathy    Encounter for central line placement    Ventricular fibrillation ([place]) 09/23/2019   Acute combined systolic and diastolic heart failure ([place]) 09/23/2019   Ventricular tachycardia, polymorphic ([place]) 09/23/2019   Pelvic pain affecting pregnancy 10/15/2015   Supervision of normal pregnancy in third trimester 09/28/2015   Poor weight gain of pregnancy 09/28/2015   Iron deficiency anemia of pregnancy 09/01/2015   Increased BMI (body mass index) 07/29/2015    Hal Morales PT, DPT 01/22/2020, 3:21 PM  [place] MAIN [facility] SERVICES [address] [place], [place], [address] Phone: [phone]   Fax:  [phone]  Name: CHLOE FLIS MRN: [id number] Date of Birth: 1985/03/10

## 2020-01-27 ENCOUNTER — Ambulatory Visit: Payer: Medicaid Other

## 2020-01-27 ENCOUNTER — Other Ambulatory Visit: Payer: Self-pay

## 2020-01-27 ENCOUNTER — Encounter: Payer: Self-pay | Admitting: Occupational Therapy

## 2020-01-27 ENCOUNTER — Ambulatory Visit: Payer: Medicaid Other | Admitting: Occupational Therapy

## 2020-01-27 ENCOUNTER — Ambulatory Visit: Payer: Medicaid Other | Admitting: Speech Pathology

## 2020-01-27 DIAGNOSIS — R41841 Cognitive communication deficit: Secondary | ICD-10-CM | POA: Diagnosis not present

## 2020-01-27 DIAGNOSIS — R278 Other lack of coordination: Secondary | ICD-10-CM

## 2020-01-27 DIAGNOSIS — R1312 Dysphagia, oropharyngeal phase: Secondary | ICD-10-CM

## 2020-01-27 DIAGNOSIS — R2681 Unsteadiness on feet: Secondary | ICD-10-CM

## 2020-01-27 DIAGNOSIS — M6281 Muscle weakness (generalized): Secondary | ICD-10-CM

## 2020-01-27 DIAGNOSIS — R2689 Other abnormalities of gait and mobility: Secondary | ICD-10-CM

## 2020-01-27 DIAGNOSIS — G931 Anoxic brain damage, not elsewhere classified: Secondary | ICD-10-CM

## 2020-01-27 NOTE — Therapy (Signed)
Beverly [name] [facility] MAIN [facility] SERVICES [address] [place], [place], [address] Phone: [phone]   Fax:  [phone]  Speech Language Pathology Treatment  Patient Details  Name: Beverly [name] MRN: [id number] Date of Birth: Jun 20, 1985 Referring Provider (SLP): Dr. Candelaria Stagers   Encounter Date: 01/27/2020   End of Session - 01/27/20 1405    Visit Number 7    Number of Visits 25    Date for SLP Re-Evaluation 03/03/20    Authorization Time Period 27 visits approved through 02/06/20    Authorization - Visit Number 24    Authorization - Number of Visits 27    Progress Note Due on Visit 10    SLP Start Time 1302    SLP Stop Time  1351    SLP Time Calculation (min) 49 min    Activity Tolerance Patient tolerated treatment well           Past Medical History:  Diagnosis Date  . Brain injury ([facility])   . CAD (coronary artery disease)   . HLD (hyperlipidemia)   . Hypertension    last pregnancy  . Ischemic cardiomyopathy   . STEMI (ST elevation myocardial infarction) ([facility]) 09/2019   SCAD with aneurysmal dilation of proximal LAD.  . Vaginal Pap smear, abnormal    when she was 34yo  . Ventricular fibrillation ([facility]) 09/23/2019    Past Surgical History:  Procedure Laterality Date  . IR GASTROSTOMY TUBE MOD SED  10/07/2019  . LEFT HEART CATH AND CORONARY ANGIOGRAPHY N/A 09/23/2019   Procedure: LEFT HEART CATH AND CORONARY ANGIOGRAPHY;  Surgeon: Yvonne Kendall, MD;  Location: [facility] INVASIVE CV LAB;  Service: Cardiovascular;  Laterality: N/A;    There were no vitals filed for this visit.   Subjective Assessment - 01/27/20 1403    Subjective Arrives with dad    Patient is accompained by: Family member   father   Currently in Pain? No/denies                 ADULT SLP TREATMENT - 01/27/20 1403      General Information   Behavior/Cognition Alert;Confused;Distractible;Requires cueing;Decreased sustained attention    HPI Pt is a 34 y.o.  female with PMH of HTN who was admitted 09/23/19 2 weeks post partum after emergent C section due to rupture of membranes with VF arrest. Pt with enterobacter PNA and encephalopathy, trach placed 8/30 and PEG on 8/31. MRI 8/31: No evidence of anoxic brain injury. Pt changed to #4 cuffless trach on 9/15; capped on 9/21 and decannulated on 9/25. MBS 9/22, POs started - primary oral dysphagia.   Pt currently using PEG as primary nutrition and hydration and for some meds, due to behavior.      Treatment Provided   Treatment provided Dysphagia;Cognitive-Linquistic      Dysphagia Treatment   Temperature Spikes Noted No    Respiratory Status Room air    Oral Cavity - Dentition Adequate natural dentition    Treatment Methods Skilled observation;Compensation strategy training;Patient/caregiver education    Patient observed directly with PO's Yes    Type of PO's observed Dysphagia 3 (soft);Thin liquids    Feeding Able to feed self;Needs assist    Liquids provided via Straw    Oral Phase Signs & Symptoms Oral holding;Prolonged bolus formation    Pharyngeal Phase Signs & Symptoms Other (comment)   none   Type of cueing Verbal;Tactile;Visual    Amount of cueing Moderate    Other treatment/comments DYSPHAGIA (13  minutes): Arrived with Dad today, who does not typically assist with meals. Pt consumed 4oz water with occasional oral holding; posterior placement of straw remains helpful to facilitate larger bolus. Mirror for biofeedback/visual cues, as well as occasional tactile cue (stroking pt's left jawline) helpful for prompting swallow initiation. Pt consumed soft solid (chocolate candy), masticating without cues, min-mod cues occasionally to initiate swallow.      Pain Assessment   Pain Assessment No/denies pain      Cognitive-Linquistic Treatment   Treatment focused on Cognition;Patient/family/caregiver education    Skilled Treatment COGNITIVE TX (36 min): Sust attention to simple orientation task  (calendar) with usual mod cues for impulsivity. Answered orientation questions 60% accuracy. Pt correctly referenced times for upcoming appointments 30 minutes later, at end of session. Sust. attention to calendar activity (answering questions re: holidays) for 5 minutes, 80% accuracy (mod cues for impulsivity). Time telling (level 1) 90% accuracy, level 3 80% accuracy.      Assessment / Recommendations / Plan   Plan Continue with current plan of care      Progression Toward Goals   Progression toward goals Progressing toward goals            SLP Education - 01/27/20 1404    Education Details tactile and visual cues for swallowing    Person(s) Educated Patient   dad   Comprehension Verbalized understanding;Need further instruction            SLP Short Term Goals - 01/08/20 0022      SLP SHORT TERM GOAL #1   Title Kyarra will sustain attention for 5 minutes to simple cognitive task with occasional min A over 3 sessions    Baseline 1-2 minutes    Time 5    Period Weeks    Status On-going      SLP SHORT TERM GOAL #2   Title Pt will verbalize 2 safety rules for walking at home with occasional min A over 3 sessions    Baseline pt states she can walk by herself    Time 5    Period Weeks    Status On-going      SLP SHORT TERM GOAL #3   Title Pt will use simple external aid to follow simple schedule and prepare for appointments with usual min A from family    Baseline does not recall or prepare for appointments    Time 5    Period Weeks    Status On-going      SLP SHORT TERM GOAL #4   Title Pt will use visual and written word cues to wash her face with usual min verbal cues from mom over 3 sessions    Baseline Per mom, pt only touches her face 2-3 times when asked to wash her face    Time 5    Period Weeks    Status On-going            SLP Long Term Goals - 01/08/20 0023      SLP LONG TERM GOAL #1   Title Pt will use external aids/to do list to complete 3 simple  chores a day with occasional min A over 3 sessions    Time 11    Period Weeks    Status On-going      SLP LONG TERM GOAL #2   Title Pt will sustain attention for 10 minutes to simple cognitive task with 4 or less re-directions over 3 sessions    Baseline sustains attention for  1-2 minutes    Time 11    Period Weeks    Status On-going      SLP LONG TERM GOAL #3   Title Pt will use visual cues and schedule to eat 2 meals a day and drink 24 oz of liquid with usual min A over 3 sessions    Baseline pt eating less than 1 meal a day and taking only sips (PEG is used)    Time 11    Period Weeks    Status On-going      SLP LONG TERM GOAL #4   Title Pt will use reduced rate to be intellgible in 5 minute conversation with occasional min A    Baseline Rapid rate, rushes of speech, 35% of utterances unintelligible    Time 11    Period Weeks    Status On-going            Plan - 01/27/20 1406    Clinical Impression Statement Danicka continues with cognitively-based dysphagia as well as severe cognitive communication deficits, dysarthria. Reduced oral holding today with visual, tactile cues. Sustained attention and impulsivity are somewhat improved today, able to sust attention with min cues 3-5 minutes. Continue skilled ST to maximize cognition and intelligibility for safety, to reduce caregiver burden.    Speech Therapy Frequency 2x / week    Duration 12 weeks   or 25 visits   Treatment/Interventions Language facilitation;Environmental controls;Cueing hierarchy;SLP instruction and feedback;Aspiration precaution training;Compensatory techniques;Cognitive reorganization;Functional tasks;Compensatory strategies;Diet toleration management by SLP;Trials of upgraded texture/liquids;Internal/external aids;Multimodal communcation approach;Patient/family education    Potential to Achieve Goals Fair    Potential Considerations Severity of impairments;Financial resources           Patient will  benefit from skilled therapeutic intervention in order to improve the following deficits and impairments:   Cognitive communication deficit  Dysphagia, oropharyngeal phase    Problem List Patient Active Problem List   Diagnosis Date Noted  . Coronary artery disease involving native coronary artery of native heart without angina pectoris 12/12/2019  . Ischemic cardiomyopathy 12/12/2019  . Brain injury ([facility])   . Prediabetes   . Anoxic brain injury ([facility]) 11/05/2019  . Dysphagia 11/05/2019  . Physical deconditioning 11/05/2019  . Acute delirium 11/05/2019  . Respiratory failure ([facility])   . Acute metabolic encephalopathy   . Encounter for central line placement   . Ventricular fibrillation ([facility]) 09/23/2019  . Acute combined systolic and diastolic heart failure ([facility]) 09/23/2019  . Ventricular tachycardia, polymorphic ([facility]) 09/23/2019  . Pelvic pain affecting pregnancy 10/15/2015  . Supervision of normal pregnancy in third trimester 09/28/2015  . Poor weight gain of pregnancy 09/28/2015  . Iron deficiency anemia of pregnancy 09/01/2015  . Increased BMI (body mass index) 07/29/2015   Rondel Baton, MS, CCC-SLP Speech-Language Pathologist   Arlana Lindau 01/27/2020, 2:08 PM  [place] [facility] MAIN [facility] SERVICES [address] [place], [place], [address] Phone: [phone]   Fax:  [phone]   Name: Beverly [name] MRN: [id number] Date of Birth: December 17, 1985

## 2020-01-27 NOTE — Therapy (Signed)
[place] MAIN [facility] SERVICES [address] [place], [place], [address] Phone: [phone]   Fax:  [phone]  Physical Therapy Treatment  Patient Details  Name: Beverly [name] MRN: [id number] Date of Birth: 04/25/1985 Referring Provider (PT): Mercy Riding, MD   Encounter Date: 01/27/2020   PT End of Session - 01/27/20 1531    Visit Number 12    Number of Visits 17    PT Start Time 0240    PT Stop Time 0320    PT Time Calculation (min) 40 min    Equipment Utilized During Treatment Gait belt    Activity Tolerance Patient tolerated treatment well    Behavior During Therapy Impulsive           Past Medical History:  Diagnosis Date  . Brain injury ([place])   . CAD (coronary artery disease)   . HLD (hyperlipidemia)   . Hypertension    last pregnancy  . Ischemic cardiomyopathy   . STEMI (ST elevation myocardial infarction) ([place]) 09/2019   SCAD with aneurysmal dilation of proximal LAD.  . Vaginal Pap smear, abnormal    when she was 34yo  . Ventricular fibrillation ([place]) 09/23/2019    Past Surgical History:  Procedure Laterality Date  . IR GASTROSTOMY TUBE MOD SED  10/07/2019  . LEFT HEART CATH AND CORONARY ANGIOGRAPHY N/A 09/23/2019   Procedure: LEFT HEART CATH AND CORONARY ANGIOGRAPHY;  Surgeon: Nelva Bush, MD;  Location: [place] CV LAB;  Service: Cardiovascular;  Laterality: N/A;    There were no vitals filed for this visit.   Subjective Assessment - 01/27/20 1531    Subjective Patient reports she is doing good today.  Presents with her dad.    Patient is accompained by: Family member   mom   Pertinent History HTN    Limitations Sitting;Standing;Walking;House hold activities    Patient Stated Goals walk better    Currently in Pain? No/denies    Pain Score 0-No pain           Verbal and tactile cueing necessary for all exercises   Ambulation 400 ft with PT hand held assist in front and wheelchair follow    Seated on green pad with ball toss  Seated Marching x20 each bilaterally 2# cueing for posture Seated soccer ball kicks Sit to stands 2x10 Side stepping in bars without UE support CGA and verbal cueing for pointing toes forward x4 laps  Bridging 5"x10 SLR flexion x10 each Hooklying abduction green TB x10                           PT Short Term Goals - 01/20/20 1542      PT SHORT TERM GOAL #1   Title Patient/caregiver will report independence with HEP with caregiver assistance (All STGs Due: 01/07/2020)    Baseline reports independence; only completing 1-2x/week per mom reports 01/20/20  patient's caregiver assisting patient with HEP.    Time 4    Period Weeks    Status On-going    Target Date 01/07/20      PT SHORT TERM GOAL #2   Title Patient will demo ability to complete transfer from w/c <> mat with CGA with LRAD to demonstrate improved functional mobility    Baseline intermittent CGA- Min A, 01/20/20  continues with intermittent need to transfer from WC<> mat no AD CGA    Time 4    Period Weeks  Status Partially Met    Target Date 01/07/20      PT SHORT TERM GOAL #3   Title Patient will demo ability to ambulate >/= 200 ft  with LRAD or hand hand assist with moderate assistance to demonstrate improved functional mobility in home.    Baseline 115 ft max assist +2 hand held assist., 01/20/20  pt ambulating 175 ft with PT hand had assist in front of patient, no AD.    Time 4    Period Weeks    Status Partially Met             PT Long Term Goals - 01/20/20 1556      PT LONG TERM GOAL #1   Title Patient/caregiver will report indepence with final HEP with caregiver assistance (All LTGS: 02/04/2020)    Baseline no HEP established, 01/20/20  caregiver assisting PT with home program    Time 8    Period Weeks    Status On-going      PT LONG TERM GOAL #2   Title Patient will demo ability to complete all bed mobility Mod I to demonstrate improved  independence    Baseline CGA/Supervision, 01/20/20 supervision, intermitten mod I to perform bed mobility    Time 8    Period Weeks    Status Partially Met      PT LONG TERM GOAL #3   Title Patient will demo ability to complete sit <> stand x 5 reps with supervision to demonstrate improved balance and functional mobility    Baseline CGA - Min A, 01/20/20  intermittent min A to perform sit to stand, sits from a standing position. CGA for safety    Time 8    Period Weeks    Status Partially Met      PT LONG TERM GOAL #4   Title Patient will demo ability to ambulate > 400 ft w/ LRAD versus hand held assist min assist to demonstrate improved household mobility and short community distances.    Baseline TBA, 01/20/20  183f with PT in front of patient, hand held assist, no AD    Time 8    Period Weeks    Status Partially Met                 Plan - 01/27/20 1532    Clinical Impression Statement Patient was able to ambulate 400 ft with CGA cueing needed to slow pace.  The patient required multiple verbal and tactile cueing for correct technique with exercises.  The patient's behavior was impulsive at times during transitional movements with discussion of need to slow down.  CGA was necessary throughout session. Patient continues to benefit from additional skilled PT services to improve LE strength, balance and ability to perform transitional movements.    Personal Factors and Comorbidities Comorbidity 1;Behavior Pattern;Transportation    Comorbidities HTN    Examination-Activity Limitations Bed Mobility;Dressing;Hygiene/Grooming;Caring for Others;Stairs;Stand;Toileting;Transfers;Sit;Bathing    Examination-Participation Restrictions Occupation;Community Activity;Medication Management;Cleaning    Stability/Clinical Decision Making Evolving/Moderate complexity    Rehab Potential Good    PT Frequency 2x / week    PT Duration 8 weeks    PT Treatment/Interventions ADLs/Self Care Home  Management;Aquatic Therapy;Electrical Stimulation;DME Instruction;Gait training;Stair training;Functional mobility training;Therapeutic activities;Therapeutic exercise;Balance training;Neuromuscular re-education;Wheelchair mobility training;Patient/family education;Orthotic Fit/Training;Manual techniques;Passive range of motion    PT Next Visit Plan FYI- Pt does have feeding tube still. Pt is impulsive. Continue to work on seated and standing balance, BLE strengthening, coordination activities, core strengthening. Gait training with hand  held assist of 2 people. Did well with 1 person in front and 1 in back with decreasing support today.    Consulted and Agree with Plan of Care Patient;Family member/caregiver    Family Member Consulted Mom/Dad           Patient will benefit from skilled therapeutic intervention in order to improve the following deficits and impairments:  Abnormal gait,Decreased balance,Decreased endurance,Decreased mobility,Difficulty walking,Impaired tone,Impaired sensation,Pain,Impaired UE functional use,Decreased strength,Decreased safety awareness,Decreased knowledge of use of DME,Decreased coordination,Decreased activity tolerance,Decreased cognition,Decreased range of motion  Visit Diagnosis: Other abnormalities of gait and mobility  Unsteadiness on feet  Anoxic brain injury [facility])     Problem List Patient Active Problem List   Diagnosis Date Noted  . Coronary artery disease involving native coronary artery of native heart without angina pectoris 12/12/2019  . Ischemic cardiomyopathy 12/12/2019  . Brain injury ([place])   . Prediabetes   . Anoxic brain injury ([place]) 11/05/2019  . Dysphagia 11/05/2019  . Physical deconditioning 11/05/2019  . Acute delirium 11/05/2019  . Respiratory failure ([place])   . Acute metabolic encephalopathy   . Encounter for central line placement   . Ventricular fibrillation ([place]) 09/23/2019  . Acute combined systolic and diastolic heart  failure ([place]) 09/23/2019  . Ventricular tachycardia, polymorphic ([place]) 09/23/2019  . Pelvic pain affecting pregnancy 10/15/2015  . Supervision of normal pregnancy in third trimester 09/28/2015  . Poor weight gain of pregnancy 09/28/2015  . Iron deficiency anemia of pregnancy 09/01/2015  . Increased BMI (body mass index) 07/29/2015    Hal Morales PT, DPT 01/27/2020, 3:45 PM  [place] MAIN [facility] SERVICES [address] [place], [place], [address] Phone: [phone]   Fax:  [phone]  Name: KYMBERLI WIEGAND MRN: [id number] Date of Birth: Jun 22, 1985

## 2020-01-27 NOTE — Therapy (Signed)
[facility] MAIN [facility] SERVICES [address] [place], [place], [address] Phone: [phone]   Fax:  [phone]  Occupational Therapy Treatment  Patient Details  Name: Beverly BrunsShatoya A Mccarry MRN: [id number] Date of Birth: 07/21/1985 Referring Provider (OT): Candelaria Stagersaye Gonfa MD   Encounter Date: 01/27/2020   OT End of Session - 01/27/20 1652    Visit Number 9    Number of Visits 24    Date for OT Re-Evaluation 03/16/20    Authorization Type Medicaid OON    OT Start Time 1350    OT Stop Time 1440    OT Time Calculation (min) 50 min    Activity Tolerance Patient tolerated treatment well    Behavior During Therapy Impulsive           Past Medical History:  Diagnosis Date  . Brain injury ([facility])   . CAD (coronary artery disease)   . HLD (hyperlipidemia)   . Hypertension    last pregnancy  . Ischemic cardiomyopathy   . STEMI (ST elevation myocardial infarction) ([facility]) 09/2019   SCAD with aneurysmal dilation of proximal LAD.  . Vaginal Pap smear, abnormal    when she was 34yo  . Ventricular fibrillation ([facility]) 09/23/2019    Past Surgical History:  Procedure Laterality Date  . IR GASTROSTOMY TUBE MOD SED  10/07/2019  . LEFT HEART CATH AND CORONARY ANGIOGRAPHY N/A 09/23/2019   Procedure: LEFT HEART CATH AND CORONARY ANGIOGRAPHY;  Surgeon: Yvonne KendallEnd, Christopher, MD;  Location: [facility] INVASIVE CV LAB;  Service: Cardiovascular;  Laterality: N/A;    There were no vitals filed for this visit.   Subjective Assessment - 01/27/20 1651    Subjective  Pt. was present with her mother    Patient is accompanied by: Family member    Pertinent History On 09/23/19 patient presented to ED 2 weeks postpartum (had emergent C-Section) with chest pain. In ED sustained VFib Cardiac Arrest requiring intubation. EKG showed ST elevation, Cath Lab showed aneurysmal dissection of the LAD was identified. Course complicated by encephalopathy, anoxic brain injury, enterobacterial PNAPMH:  HTN    Currently in Pain? No/denies          OT TREATMENT    Neuro muscular re-education:  Pt. worked on grasping 1" resistive cubes with her right hand, transferring the cubes one at a time from her right hand to her left hand, and placing them at the left. Pt. worked in the reverse direction when placing them back onto the vertical board. Pt. Pt. Worked on pressing them back into place with isolated 2nd digit.  Therapeutic Exercise:  Pt. worked on left hand pinch strengthening hand for lateral, and 3pt. pinch using yellow, red, green, and blue resistive clips. Right hand with yellow red, and green resistive clips with cues for hand, and clips position. Pt. worked on placing the clips on horizontal dowels.  Pt. performed gross gripping with the grip strengthener. Pt. worked on sustaining grip while grasping pegs and reaching at various heights with the left, and at the tabletop for the right. The Gripper was set at 11.2# for the right, and 6.6# for the left.  Pt. is making steady progress with bilateral hand functioning. Pt. was able to transfer items between her hands with improving motor control, and coordination today. Pt. Requires verbal cues to perform each task slowly, and assist for position of the clips, and the gripper in her hands. Pt. continues to work on improving BUE motor control, and [facility] skills in order  to work towards improving, and maximizing independence with ADLs, and IADL tasks.                      OT Education - 01/27/20 1652    Education Details FMC, bilateral hand coordination    Person(s) Educated Patient;Parent(s)    Methods Explanation    Comprehension Need further instruction            OT Short Term Goals - 01/07/20 1308      OT SHORT TERM GOAL #1   Title Pt/ caregiver will be I with inital HEP.    Baseline dependent    Time 6    Period Weeks    Status On-going    Target Date 02/03/20      OT SHORT TERM GOAL #2   Title Pt will  perform UB dressing consistently with supervision/ set up and min v.c    Baseline min A    Time 6    Period Weeks    Status Achieved      OT SHORT TERM GOAL #3   Title Pt will demonstrate sustained attention to a simple functional task for 2 mins, in a minimally distracting environment.    Baseline 1 min with re-direction    Time 6    Period Weeks    Status Achieved      OT SHORT TERM GOAL #4   Title Pt will wash her face and brush her teeth with no more than min v.c for thoroughness    Baseline mod A    Time 6    Period Weeks    Status On-going      OT SHORT TERM GOAL #5   Title Pt will demonstrate improved bilateral functional use as evidenced by increasing box/blocks score by 3 blocks    Baseline RUE10 blocks, LUE 12 blocks    Time 6    Period Weeks    Status On-going      OT SHORT TERM GOAL #6   Title Pt will demonstrate ability to sequence a simple functional or ADL task with no more than min v.c    Baseline Pt requires grossly mod A with sequencing simple activities    Time 6    Period Weeks    Status On-going             OT Long Term Goals - 12/31/19 1713      OT LONG TERM GOAL #1   Title Pt will perform LB bathing and dressing with no more than min A.    Baseline max A    Time 12    Period Weeks    Status On-going      OT LONG TERM GOAL #2   Title Pt will demonstrate improved bilateral UE functional use by increasing box/ blocks score by 6 blocks from inital measurement.    Baseline RUE 10 blocks, LUE 12 blocks    Time 12    Period Weeks    Status On-going      OT LONG TERM GOAL #3   Title Pt will perform simple cold snack prep with min A.    Baseline dependent    Time 12    Period Weeks    Status On-going      OT LONG TERM GOAL #4   Title Pt will attend to an ADL/ simple functional familiar task for 10 mins.    Baseline attends for grossly 1 min with re-direct.  Time 12    Period Weeks    Status On-going      OT LONG TERM GOAL #5    Title Pt perform a simple home management task with no more than min A, demonstrating good safety awareness.    Baseline dependent    Time 12    Period Weeks    Status On-going      OT LONG TERM GOAL #6   Title Pt will write her name with 75% legibility    Baseline handwriting is illegibile    Time 12    Period Weeks    Status On-going                 Plan - 01/27/20 1653    Clinical Impression Statement Pt. is making steady progress with bilateral hand functioning. Pt. was able to transfer items between her hands with improving motor control, and coordination today. Pt. Requires verbal cues to perform each task slowly, and assist for position of the clips, and the gripper in her hands. Pt. continues to work on improving BUE motor control, and [facility] skills in order to work towards improving, and maximizing independence with ADLs, and IADL tasks.   OT Occupational Profile and History Detailed Assessment- Review of Records and additional review of physical, cognitive, psychosocial history related to current functional performance    Occupational performance deficits (Please refer to evaluation for details): ADL's;IADL's;Work;Leisure;Social Participation    Body Structure / Function / Physical Skills ADL;Endurance;UE functional use;Balance;Pain;Flexibility;FMC;ROM;GMC;Coordination;Decreased knowledge of precautions;Decreased knowledge of use of DME;IADL;Dexterity;Strength;Mobility;Tone;Vision;Sensation;Gait    Cognitive Skills Attention;Energy/Drive;Memory;Orientation;Problem Solve;Safety Awareness;Thought;Understand    Rehab Potential Good    Clinical Decision Making Several treatment options, min-mod task modification necessary    Comorbidities Affecting Occupational Performance: May have comorbidities impacting occupational performance    Modification or Assistance to Complete Evaluation  Min-Moderate modification of tasks or assist with assess necessary to complete eval    OT Frequency  2x / week    OT Duration 12 weeks    OT Treatment/Interventions Self-care/ADL training;Ultrasound;Visual/perceptual remediation/compensation;Patient/family education;Scar mobilization;Aquatic Therapy;Paraffin;Passive range of motion;Balance training;Dry needling;Stair Training;Fluidtherapy;Cryotherapy;Splinting;Moist Heat;Manual Therapy;Therapeutic exercise;Therapeutic activities;Functional Mobility Training;Neuromuscular education;Cognitive remediation/compensation    Consulted and Agree with Plan of Care Patient;Family member/caregiver           Patient will benefit from skilled therapeutic intervention in order to improve the following deficits and impairments:   Body Structure / Function / Physical Skills: ADL,Endurance,UE functional use,Balance,Pain,Flexibility,FMC,ROM,GMC,Coordination,Decreased knowledge of precautions,Decreased knowledge of use of DME,IADL,Dexterity,Strength,Mobility,Tone,Vision,Sensation,Gait Cognitive Skills: Attention,Energy/Drive,Memory,Orientation,Problem Solve,Safety Awareness,Thought,Understand     Visit Diagnosis: Muscle weakness (generalized)  Other lack of coordination    Problem List Patient Active Problem List   Diagnosis Date Noted  . Coronary artery disease involving native coronary artery of native heart without angina pectoris 12/12/2019  . Ischemic cardiomyopathy 12/12/2019  . Brain injury ([facility])   . Prediabetes   . Anoxic brain injury ([facility]) 11/05/2019  . Dysphagia 11/05/2019  . Physical deconditioning 11/05/2019  . Acute delirium 11/05/2019  . Respiratory failure ([facility])   . Acute metabolic encephalopathy   . Encounter for central line placement   . Ventricular fibrillation ([facility]) 09/23/2019  . Acute combined systolic and diastolic heart failure ([facility]) 09/23/2019  . Ventricular tachycardia, polymorphic ([facility]) 09/23/2019  . Pelvic pain affecting pregnancy 10/15/2015  . Supervision of normal pregnancy in third trimester 09/28/2015  . Poor  weight gain of pregnancy 09/28/2015  . Iron deficiency anemia of pregnancy 09/01/2015  . Increased BMI (body mass index) 07/29/2015  Olegario Messier, MS, OTR/L 01/27/2020, 4:55 PM  [facility] MAIN [facility] SERVICES [address] [place], [place], [address] Phone: [phone]   Fax:  [phone]  Name: DEZIREE MOKRY MRN: [id number] Date of Birth: 02-02-1986

## 2020-01-29 ENCOUNTER — Ambulatory Visit: Payer: Medicaid Other

## 2020-01-29 ENCOUNTER — Encounter: Payer: Medicaid Other | Admitting: Speech Pathology

## 2020-01-29 ENCOUNTER — Encounter: Payer: Medicaid Other | Admitting: Occupational Therapy

## 2020-02-03 ENCOUNTER — Ambulatory Visit: Payer: Medicaid Other

## 2020-02-03 ENCOUNTER — Encounter: Payer: Medicaid Other | Admitting: Occupational Therapy

## 2020-02-03 ENCOUNTER — Other Ambulatory Visit: Payer: Self-pay

## 2020-02-03 ENCOUNTER — Ambulatory Visit: Payer: Medicaid Other | Admitting: Speech Pathology

## 2020-02-03 DIAGNOSIS — R41841 Cognitive communication deficit: Secondary | ICD-10-CM

## 2020-02-03 NOTE — Therapy (Signed)
[facility] MAIN [facility] SERVICES [address] [place], [place], [address] Phone: [phone]   Fax:  [phone]  Speech Language Pathology Treatment  Patient Details  Name: Beverly [name] MRN: [id number] Date of Birth: Mar 11, 1985 Referring Provider (SLP): Dr. Candelaria Stagers   Encounter Date: 02/03/2020   End of Session - 02/03/20 1526    Visit Number 8    Number of Visits 25    Date for SLP Re-Evaluation 03/03/20    Authorization Time Period 27 visits approved through 02/06/20    Authorization - Visit Number 27    Authorization - Number of Visits 27    Progress Note Due on Visit 10    SLP Start Time 1400    SLP Stop Time  1450    SLP Time Calculation (min) 50 min    Activity Tolerance Patient tolerated treatment well           Past Medical History:  Diagnosis Date  . Brain injury (HCC)   . CAD (coronary artery disease)   . HLD (hyperlipidemia)   . Hypertension    last pregnancy  . Ischemic cardiomyopathy   . STEMI (ST elevation myocardial infarction) (HCC) 09/2019   SCAD with aneurysmal dilation of proximal LAD.  . Vaginal Pap smear, abnormal    when she was 34yo  . Ventricular fibrillation (HCC) 09/23/2019    Past Surgical History:  Procedure Laterality Date  . IR GASTROSTOMY TUBE MOD SED  10/07/2019  . LEFT HEART CATH AND CORONARY ANGIOGRAPHY N/A 09/23/2019   Procedure: LEFT HEART CATH AND CORONARY ANGIOGRAPHY;  Surgeon: Yvonne Kendall, MD;  Location: [facility] INVASIVE CV LAB;  Service: Cardiovascular;  Laterality: N/A;    There were no vitals filed for this visit.   Subjective Assessment - 02/03/20 1500    Subjective Pt repeating/completing mom's sentences today    Currently in Pain? No/denies                 ADULT SLP TREATMENT - 02/03/20 1400      General Information   Behavior/Cognition Alert;Cooperative;Pleasant mood    HPI Pt is a 34 y.o. female with PMH of HTN who was admitted 09/23/19 2 weeks post partum  after emergent C section due to rupture of membranes with VF arrest. Pt with enterobacter PNA and encephalopathy, trach placed 8/30 and PEG on 8/31. MRI 8/31: No evidence of anoxic brain injury. Pt changed to #4 cuffless trach on 9/15; capped on 9/21 and decannulated on 9/25. MBS 9/22, POs started - primary oral dysphagia.   Pt currently using PEG as primary nutrition and hydration and for some meds, due to behavior.      Treatment Provided   Treatment provided Cognitive-Linquistic      Pain Assessment   Pain Assessment No/denies pain      Cognitive-Linquistic Treatment   Treatment focused on Cognition;Patient/family/caregiver education    Skilled Treatment Patient not using schedule or calendar at home; worked today with pt and mother to generate schedule for patient. Mother reports pt was "eating better in the hospital," than at home. With questioning, determined that feeding times/locations are inconsistent, often with TV on. SLP educated re: maximizing pt's opportunity to consume POs by minimizing distractions and using the environment to help cue pt for eating. Added Lunch and Dinner to schedule and mom educated to have pt sit at table, minimizing distractions. Pt held sustained attention in simple orientation and schedule tasks, and for simple conversation with mod cues.  Assessment / Recommendations / Plan   Plan Continue with current plan of care      Progression Toward Goals   Progression toward goals Progressing toward goals            SLP Education - 02/03/20 1525    Education Details Use daily schedule with Kennetha so she knows what to expect in her day    Person(s) Educated Patient;Parent(s)    Methods Explanation;Demonstration;Handout    Comprehension Verbalized understanding            SLP Short Term Goals - 01/08/20 0022      SLP SHORT TERM GOAL #1   Title Cidney will sustain attention for 5 minutes to simple cognitive task with occasional min A over 3 sessions     Baseline 1-2 minutes    Time 5    Period Weeks    Status On-going      SLP SHORT TERM GOAL #2   Title Pt will verbalize 2 safety rules for walking at home with occasional min A over 3 sessions    Baseline pt states she can walk by herself    Time 5    Period Weeks    Status On-going      SLP SHORT TERM GOAL #3   Title Pt will use simple external aid to follow simple schedule and prepare for appointments with usual min A from family    Baseline does not recall or prepare for appointments    Time 5    Period Weeks    Status On-going      SLP SHORT TERM GOAL #4   Title Pt will use visual and written word cues to wash her face with usual min verbal cues from mom over 3 sessions    Baseline Per mom, pt only touches her face 2-3 times when asked to wash her face    Time 5    Period Weeks    Status On-going            SLP Long Term Goals - 01/08/20 0023      SLP LONG TERM GOAL #1   Title Pt will use external aids/to do list to complete 3 simple chores a day with occasional min A over 3 sessions    Time 11    Period Weeks    Status On-going      SLP LONG TERM GOAL #2   Title Pt will sustain attention for 10 minutes to simple cognitive task with 4 or less re-directions over 3 sessions    Baseline sustains attention for 1-2 minutes    Time 11    Period Weeks    Status On-going      SLP LONG TERM GOAL #3   Title Pt will use visual cues and schedule to eat 2 meals a day and drink 24 oz of liquid with usual min A over 3 sessions    Baseline pt eating less than 1 meal a day and taking only sips (PEG is used)    Time 11    Period Weeks    Status On-going      SLP LONG TERM GOAL #4   Title Pt will use reduced rate to be intellgible in 5 minute conversation with occasional min A    Baseline Rapid rate, rushes of speech, 35% of utterances unintelligible    Time 11    Period Weeks    Status On-going  Plan - 02/03/20 1527    Clinical Impression Statement  Reyanne was more alert and talkative today, although appeared somewhat anxious: "I'm nervous." SLP worked with pt and mother today to develop home schedule and feeding routine to manage cognitive aspect of pt's dysphagia, to maximize Loda's oral intake and reduce caregiver burden.    Speech Therapy Frequency 2x / week    Duration 12 weeks   or 25 visits   Treatment/Interventions Language facilitation;Environmental controls;Cueing hierarchy;SLP instruction and feedback;Aspiration precaution training;Compensatory techniques;Cognitive reorganization;Functional tasks;Compensatory strategies;Diet toleration management by SLP;Trials of upgraded texture/liquids;Internal/external aids;Multimodal communcation approach;Patient/family education    Potential to Achieve Goals Fair    Potential Considerations Severity of impairments;Financial resources           Patient will benefit from skilled therapeutic intervention in order to improve the following deficits and impairments:   Cognitive communication deficit    Problem List Patient Active Problem List   Diagnosis Date Noted  . Coronary artery disease involving native coronary artery of native heart without angina pectoris 12/12/2019  . Ischemic cardiomyopathy 12/12/2019  . Brain injury (HCC)   . Prediabetes   . Anoxic brain injury (HCC) 11/05/2019  . Dysphagia 11/05/2019  . Physical deconditioning 11/05/2019  . Acute delirium 11/05/2019  . Respiratory failure (HCC)   . Acute metabolic encephalopathy   . Encounter for central line placement   . Ventricular fibrillation (HCC) 09/23/2019  . Acute combined systolic and diastolic heart failure (HCC) 09/23/2019  . Ventricular tachycardia, polymorphic (HCC) 09/23/2019  . Pelvic pain affecting pregnancy 10/15/2015  . Supervision of normal pregnancy in third trimester 09/28/2015  . Poor weight gain of pregnancy 09/28/2015  . Iron deficiency anemia of pregnancy 09/01/2015  . Increased BMI (body  mass index) 07/29/2015   Rondel Baton, MS, CCC-SLP Speech-Language Pathologist  Arlana Lindau 02/03/2020, 3:31 PM  [facility] MAIN [facility] SERVICES [address] [place], [place], [address] Phone: [phone]   Fax:  [phone]   Name: KIERSTEN COSS MRN: [id number] Date of Birth: 1985-03-22

## 2020-02-05 ENCOUNTER — Ambulatory Visit: Payer: Medicaid Other

## 2020-02-05 ENCOUNTER — Encounter: Payer: Medicaid Other | Admitting: Occupational Therapy

## 2020-02-05 ENCOUNTER — Encounter: Payer: Medicaid Other | Admitting: Speech Pathology

## 2020-02-10 ENCOUNTER — Ambulatory Visit: Payer: Medicaid Other | Admitting: Occupational Therapy

## 2020-02-10 ENCOUNTER — Ambulatory Visit: Payer: Medicaid Other

## 2020-02-10 ENCOUNTER — Ambulatory Visit: Payer: Medicaid Other | Admitting: Speech Pathology

## 2020-02-12 ENCOUNTER — Ambulatory Visit: Payer: Medicaid Other | Attending: Student | Admitting: Speech Pathology

## 2020-02-12 ENCOUNTER — Encounter: Payer: Self-pay | Admitting: Physical Medicine & Rehabilitation

## 2020-02-12 ENCOUNTER — Ambulatory Visit: Payer: Medicaid Other

## 2020-02-12 ENCOUNTER — Encounter: Payer: Medicaid Other | Admitting: Occupational Therapy

## 2020-02-12 ENCOUNTER — Encounter: Payer: Medicaid Other | Attending: Physical Medicine & Rehabilitation | Admitting: Physical Medicine & Rehabilitation

## 2020-02-12 ENCOUNTER — Other Ambulatory Visit: Payer: Self-pay

## 2020-02-12 VITALS — BP 103/69 | HR 62 | Temp 98.6°F | Ht 69.0 in | Wt 171.0 lb

## 2020-02-12 DIAGNOSIS — R1312 Dysphagia, oropharyngeal phase: Secondary | ICD-10-CM | POA: Insufficient documentation

## 2020-02-12 DIAGNOSIS — R2689 Other abnormalities of gait and mobility: Secondary | ICD-10-CM | POA: Insufficient documentation

## 2020-02-12 DIAGNOSIS — R278 Other lack of coordination: Secondary | ICD-10-CM

## 2020-02-12 DIAGNOSIS — R262 Difficulty in walking, not elsewhere classified: Secondary | ICD-10-CM | POA: Insufficient documentation

## 2020-02-12 DIAGNOSIS — R2681 Unsteadiness on feet: Secondary | ICD-10-CM | POA: Diagnosis present

## 2020-02-12 DIAGNOSIS — G931 Anoxic brain damage, not elsewhere classified: Secondary | ICD-10-CM

## 2020-02-12 DIAGNOSIS — G479 Sleep disorder, unspecified: Secondary | ICD-10-CM | POA: Diagnosis not present

## 2020-02-12 DIAGNOSIS — M6281 Muscle weakness (generalized): Secondary | ICD-10-CM

## 2020-02-12 DIAGNOSIS — R471 Dysarthria and anarthria: Secondary | ICD-10-CM | POA: Diagnosis present

## 2020-02-12 DIAGNOSIS — R269 Unspecified abnormalities of gait and mobility: Secondary | ICD-10-CM

## 2020-02-12 DIAGNOSIS — R41841 Cognitive communication deficit: Secondary | ICD-10-CM | POA: Diagnosis not present

## 2020-02-12 MED ORDER — AMANTADINE HCL 50 MG/5ML PO SOLN: 50.0000 mg | Freq: Two times a day (BID) | ORAL | 1 refills | Status: DC

## 2020-02-12 NOTE — Therapy (Signed)
[facility] MAIN [facility] SERVICES [address] [place], [place], [address] Phone: [phone]   Fax:  [phone]  Speech Language Pathology Treatment  Patient Details  Name: Beverly [name] MRN: [id number] Date of Birth: [birth year] Referring Provider (SLP): Dr. Candelaria Stagers   Encounter Date: 02/12/2020   End of Session - 02/12/20 1702    Visit Number 9    Number of Visits 25    Date for SLP Re-Evaluation 03/03/20    Authorization Time Period 11 ST visits after 1/1    Authorization - Visit Number 1    Authorization - Number of Visits 11    Progress Note Due on Visit 10    SLP Start Time 1600    SLP Stop Time  1650    SLP Time Calculation (min) 50 min    Activity Tolerance Patient tolerated treatment well           Past Medical History:  Diagnosis Date  . Brain injury (HCC)   . CAD (coronary artery disease)   . HLD (hyperlipidemia)   . Hypertension    last pregnancy  . Ischemic cardiomyopathy   . STEMI (ST elevation myocardial infarction) (HCC) 09/2019   SCAD with aneurysmal dilation of proximal LAD.  . Vaginal Pap smear, abnormal    when she was 35yo  . Ventricular fibrillation (HCC) 09/23/2019    Past Surgical History:  Procedure Laterality Date  . IR GASTROSTOMY TUBE MOD SED  10/07/2019  . LEFT HEART CATH AND CORONARY ANGIOGRAPHY N/A 09/23/2019   Procedure: LEFT HEART CATH AND CORONARY ANGIOGRAPHY;  Surgeon: Yvonne Kendall, MD;  Location: [facility] INVASIVE CV LAB;  Service: Cardiovascular;  Laterality: N/A;    There were no vitals filed for this visit.   Subjective Assessment - 02/12/20 1658    Subjective "I'm not hungry."    Currently in Pain? No/denies                 ADULT SLP TREATMENT - 02/12/20 1706      General Information   Behavior/Cognition Alert;Cooperative;Distractible;Pleasant mood;Requires cueing;Decreased sustained attention    HPI Pt is a 35 y.o. female with PMH of HTN who was admitted 09/23/19 2  weeks post partum after emergent C section due to rupture of membranes with VF arrest. Pt with enterobacter PNA and encephalopathy, trach placed 8/30 and PEG on 8/31. MRI 8/31: No evidence of anoxic brain injury. Pt changed to #4 cuffless trach on 9/15; capped on 9/21 and decannulated on 9/25. MBS 9/22, POs started - primary oral dysphagia.   Pt currently using PEG as primary nutrition and hydration and for some meds, due to behavior.      Treatment Provided   Treatment provided Cognitive-Linquistic      Pain Assessment   Pain Assessment No/denies pain      Cognitive-Linquistic Treatment   Treatment focused on Cognition;Patient/family/caregiver education    Skilled Treatment Mom reports using schedule daily since last session; did not bring it today. Has been putting Abigal at the table for meals but hasn't noticed a significant improvement in this helping her eating. Beverly [name] maintained attention in simple immediate recall tasks with usual mod cues for 5 minutes at a time. Recall was approximately 70% for 2 pictures from F:6, 60% for 3 pictures from F:8. Fewer cues necessary for impulsivity. Pt success improved when she was able to verbalize the item in the picture. Worked with pt and mother to ID pt's repeated questions to begin putting together  a memory book for patient to review when she asks about her life details. When SLP and mom talked, Beverly [name] attended to the conversation and occasionally provided appropriate responses.      Assessment / Recommendations / Plan   Plan Continue with current plan of care      Progression Toward Goals   Progression toward goals Progressing toward goals            SLP Education - 02/12/20 1659    Education Details cognitive activities for home; bring children's book next time for Beverly [name] to practice reading to her children    Person(s) Educated Patient;Parent(s)    Methods Explanation    Comprehension Verbalized understanding            SLP Short  Term Goals - 01/08/20 0022      SLP SHORT TERM GOAL #1   Title Beverly [name] will sustain attention for 5 minutes to simple cognitive task with occasional min A over 3 sessions    Baseline 1-2 minutes    Time 5    Period Weeks    Status On-going      SLP SHORT TERM GOAL #2   Title Pt will verbalize 2 safety rules for walking at home with occasional min A over 3 sessions    Baseline pt states she can walk by herself    Time 5    Period Weeks    Status On-going      SLP SHORT TERM GOAL #3   Title Pt will use simple external aid to follow simple schedule and prepare for appointments with usual min A from family    Baseline does not recall or prepare for appointments    Time 5    Period Weeks    Status On-going      SLP SHORT TERM GOAL #4   Title Pt will use visual and written word cues to wash her face with usual min verbal cues from mom over 3 sessions    Baseline Per mom, pt only touches her face 2-3 times when asked to wash her face    Time 5    Period Weeks    Status On-going            SLP Long Term Goals - 01/08/20 0023      SLP LONG TERM GOAL #1   Title Pt will use external aids/to do list to complete 3 simple chores a day with occasional min A over 3 sessions    Time 11    Period Weeks    Status On-going      SLP LONG TERM GOAL #2   Title Pt will sustain attention for 10 minutes to simple cognitive task with 4 or less re-directions over 3 sessions    Baseline sustains attention for 1-2 minutes    Time 11    Period Weeks    Status On-going      SLP LONG TERM GOAL #3   Title Pt will use visual cues and schedule to eat 2 meals a day and drink 24 oz of liquid with usual min A over 3 sessions    Baseline pt eating less than 1 meal a day and taking only sips (PEG is used)    Time 11    Period Weeks    Status On-going      SLP LONG TERM GOAL #4   Title Pt will use reduced rate to be intellgible in 5 minute conversation with occasional min A  Baseline Rapid rate,  rushes of speech, 35% of utterances unintelligible    Time 11    Period Weeks    Status On-going            Plan - 02/12/20 1703    Clinical Impression Statement Beverly [name] is using a schedule at home with assistance from her mother since last ST visit. Continues with repetitive questioning re: basic personal facts (her children, school, whether certain family members are living. May benefit from memory book to assist with recall. Continue skilled ST to maximize cognition for improved oral intake, safety, and quality of life.    Speech Therapy Frequency 2x / week    Duration 12 weeks   or 25 visits   Treatment/Interventions Language facilitation;Environmental controls;Cueing hierarchy;SLP instruction and feedback;Aspiration precaution training;Compensatory techniques;Cognitive reorganization;Functional tasks;Compensatory strategies;Diet toleration management by SLP;Trials of upgraded texture/liquids;Internal/external aids;Multimodal communcation approach;Patient/family education    Potential to Achieve Goals Fair    Potential Considerations Severity of impairments;Financial resources           Patient will benefit from skilled therapeutic intervention in order to improve the following deficits and impairments:   Cognitive communication deficit    Problem List Patient Active Problem List   Diagnosis Date Noted  . Oropharyngeal dysphagia 02/12/2020  . Coronary artery disease involving native coronary artery of native heart without angina pectoris 12/12/2019  . Ischemic cardiomyopathy 12/12/2019  . Brain injury (HCC)   . Prediabetes   . Anoxic brain injury (HCC) 11/05/2019  . Dysphagia 11/05/2019  . Physical deconditioning 11/05/2019  . Acute delirium 11/05/2019  . Respiratory failure (HCC)   . Acute metabolic encephalopathy   . Encounter for central line placement   . Ventricular fibrillation (HCC) 09/23/2019  . Acute combined systolic and diastolic heart failure (HCC) 09/23/2019   . Ventricular tachycardia, polymorphic (HCC) 09/23/2019  . Pelvic pain affecting pregnancy 10/15/2015  . Supervision of normal pregnancy in third trimester 09/28/2015  . Poor weight gain of pregnancy 09/28/2015  . Iron deficiency anemia of pregnancy 09/01/2015  . Increased BMI (body mass index) 07/29/2015   Rondel Baton, MS, CCC-SLP Speech-Language Pathologist  Arlana Lindau 02/12/2020, 5:11 PM  [facility] MAIN [facility] SERVICES [address] [place], [place], [address] Phone: [phone]   Fax:  [phone]   Name: CYSTAL SHANNAHAN MRN: [id number] Date of Birth: [birth year]

## 2020-02-12 NOTE — Progress Notes (Signed)
Subjective:    Patient ID: Beverly [name], female    DOB: May 09, 1985, 35 y.o.   MRN: [id number]  HPI  Female with pmh of Vfib STEMI, HTN, anoxic BI presents with anoxic BI.  Mother provides history.  Started 09/23/19.  She delivered her 3rd child and had a heart attack 2 weeks later.  She was unresponsive for 4-5 minutes. Memory is biggest deficit.  She was not able to come to CIR due to insurance.  She was in the hospital for 65 days.  She did not have HH for the same reason.  She is outpatient therapies 2/week.  Mother had to quit her job and takes care of patient and her 3 children.  She now has a home health aid 5/week for 3.5 hours/day.  She is losing weight due to pocketing food and forgetting to swallow.  She has a PEG, which is being used.  Incontinent most of the time with bowel/bladder.  Sleep is poor. Currently taking benadryl.  She has fatigue.  She had falls initially when she came home from the hospital, but has been doing better.  She has tightness in arms/legs.  She is on benadryl and melatonin for sleep. Mother does notice improvement, but states other people have noticed improvement. Behavior waxes/wanes.    Pain Inventory Average Pain 0 Pain Right Now 0 My pain is na  LOCATION OF PAIN  na  BOWEL Number of stools per week: 5 Oral laxative use No  Type of laxative none Enema or suppository use No  History of colostomy No  Incontinent Yes   BLADDER Pads In and out cath, frequency na Able to self cath na Bladder incontinence Yes  Frequent urination No  Leakage with coughing No  Difficulty starting stream No  Incomplete bladder emptying No    Mobility walk with assistance how many minutes can you walk? 3-5 ability to climb steps?  yes do you drive?  no needs help with transfers Do you have any goals in this area?  yes  Function not employed: date last employed 08/2019 disabled: date disabled 09/23/2019 I need assistance with the following:  feeding, dressing,  bathing, toileting, meal prep, household duties and shopping  Neuro/Psych weakness trouble walking spasms confusion anxiety  Prior Studies New pt  Physicians involved in your care New pt   Family History  Problem Relation Age of Onset  . Hyperlipidemia Mother   . Hypertension Mother   . Diabetes Maternal Grandmother   . Seizures Maternal Grandmother   . Thyroid disease Maternal Grandmother   . Diabetes Paternal Grandmother   . Rashes / Skin problems Son        ezcema  . Seizures Son    Social History   Socioeconomic History  . Marital status: Single    Spouse name: Not on file  . Number of children: 3  . Years of education: Not on file  . Highest education level: Not on file  Occupational History  . Occupation: oncology    Employer: [employer]    Comment: NA  Tobacco Use  . Smoking status: Never Smoker  . Smokeless tobacco: Never Used  Vaping Use  . Vaping Use: Never used  Substance and Sexual Activity  . Alcohol use: Not Currently    Alcohol/week: 0.0 standard drinks    Comment: quit 2017  . Drug use: No    Frequency: 5.0 times per week    Types: Marijuana    Comment: stopped when she found  out she was pregnant  . Sexual activity: Not Currently    Partners: Male    Birth control/protection: None  Other Topics Concern  . Not on file  Social History Narrative   01/06/20 lives with mom and her children   Social Determinants of Health   Financial Resource Strain: Not on file  Food Insecurity: Not on file  Transportation Needs: Not on file  Physical Activity: Not on file  Stress: Not on file  Social Connections: Not on file   Past Surgical History:  Procedure Laterality Date  . IR GASTROSTOMY TUBE MOD SED  10/07/2019  . LEFT HEART CATH AND CORONARY ANGIOGRAPHY N/A 09/23/2019   Procedure: LEFT HEART CATH AND CORONARY ANGIOGRAPHY;  Surgeon: Yvonne Kendall, MD;  Location: [facility] INVASIVE CV LAB;  Service: Cardiovascular;  Laterality: N/A;   Past  Medical History:  Diagnosis Date  . Brain injury (HCC)   . CAD (coronary artery disease)   . HLD (hyperlipidemia)   . Hypertension    last pregnancy  . Ischemic cardiomyopathy   . STEMI (ST elevation myocardial infarction) (HCC) 09/2019   SCAD with aneurysmal dilation of proximal LAD.  . Vaginal Pap smear, abnormal    when she was 35yo  . Ventricular fibrillation (HCC) 09/23/2019   BP 103/69   Pulse 62   Temp 98.6 F (37 C)   Ht 5\' 9"  (1.753 m)   Wt 171 lb (77.6 kg) Comment: wheelchair weight is 44.2lb, total wt w/wc 215.2  SpO2 98%   BMI 25.25 kg/m   Opioid Risk Score:   Fall Risk Score:  `1  Depression screen PHQ 2/9  Depression screen PHQ 2/9 02/12/2020  Decreased Interest 2  Down, Depressed, Hopeless 0  PHQ - 2 Score 2  Altered sleeping 2  Tired, decreased energy 1  Change in appetite 3  Feeling bad or failure about yourself  0  Trouble concentrating 3  Moving slowly or fidgety/restless 3  Suicidal thoughts 0  PHQ-9 Score 14  Difficult doing work/chores Extremely dIfficult     Review of Systems  Unable to perform ROS: Other  Brain injury     Objective:   Physical Exam Constitutional: No distress . Vital signs reviewed. HENT: Normocephalic.  Atraumatic. Eyes: EOMI. No discharge. Cardiovascular: No JVD.   Respiratory: Normal effort.  No stridor.   GI: Non-distended.  +PEG. Skin: Warm and dry.  Intact. Psych: Flat. Disengaged.  Musc: No edema in extremities.  No tenderness in extremities. Neuro: Alert and oriented x2. Dysarthria Motor: Limited, grossly 4/5 throughout No obvious tone noted, however, significant guarding    Assessment & Plan:  Female with pmh of Vfib STEMI, HTN, anoxic BI presents with anoxic BI  1. Anoxic BI - memory biggest complaint  MRI/CTs unremarkable  Labs reviewed  Chart/Referral information reviewed - Anoxic BI  PMAWARE reviewed  UDS performed  Continue speech therapy  Continue PT  Continue OT  Provide environmental  management by reducing the level of stimulation, tolerating restlessness when possible, protecting patient from harming self or others and reducing patient's cognitive confusion.  Address behavioral concerns include providing structured environments and daily routines.  Cognitive therapy to direct modular abilities in order to maintain goals  including problem solving, self regulation/monitoring, self management, attention, and memory.  Avoid medications that could impair cognitive abilities, such as anticholinergics, antihistaminic, benzodiazapines, narcotics, etc when possible  Will order Amantadine 50mg  with breakfast and lunch for fatigue  Will consider Ritalin  Will consider SSRI vs mood stablizer  Encouraged diary for good/bad days.  2. Dysphagia  Cont PEG  Limited by cognition with pocketing food   Remove distractions  Supplement as necessary  Continue to current oral intake  3. Gait abnormality  Cont ambulation with assistance   3. Sleep disturbance  Melatonin 10mg  daily (vs prn)  Avoid benadryl  Educated on sleep hygiene and routine

## 2020-02-12 NOTE — Therapy (Signed)
[place] MAIN [facility] SERVICES [address] [place], [place], [address] Phone: [phone]   Fax:  [phone]  Physical Therapy Treatment  Patient Details  Name: Beverly [name] MRN: [id number] Date of Birth: [birth year] Referring Provider (PT): Mercy Riding, MD   Encounter Date: 02/12/2020   PT End of Session - 02/12/20 1617    Visit Number 13   (1/12)   Number of Visits 17    PT Start Time 1520    PT Stop Time 1608    PT Time Calculation (min) 48 min    Equipment Utilized During Treatment Gait belt    Activity Tolerance Patient tolerated treatment well    Behavior During Therapy Impulsive           Past Medical History:  Diagnosis Date  . Brain injury ([place])   . CAD (coronary artery disease)   . HLD (hyperlipidemia)   . Hypertension    last pregnancy  . Ischemic cardiomyopathy   . STEMI (ST elevation myocardial infarction) ([place]) 09/2019   SCAD with aneurysmal dilation of proximal LAD.  . Vaginal Pap smear, abnormal    when she was 35yo  . Ventricular fibrillation ([place]) 09/23/2019    Past Surgical History:  Procedure Laterality Date  . IR GASTROSTOMY TUBE MOD SED  10/07/2019  . LEFT HEART CATH AND CORONARY ANGIOGRAPHY N/A 09/23/2019   Procedure: LEFT HEART CATH AND CORONARY ANGIOGRAPHY;  Surgeon: Nelva Bush, MD;  Location: [place] [facility];  Service: Cardiovascular;  Laterality: N/A;    There were no vitals filed for this visit.   Subjective Assessment - 02/12/20 1528    Subjective Pt reports she is doing well and that she went to see her physician this morning..    Patient is accompained by: Family member   mom   Pertinent History HTN    Limitations Sitting;Standing;Walking;House hold activities    Patient Stated Goals walk better               Ambulation 380 ft with PT CGA-Min assist x1 and wheelchair follow  Ambulation in // bars forward/backward 4x each with B UE support and CGA Side-stepping in //  bars 4x each way, B UE support on bar and CGA Balloon toss in // bars, WBOS on airex pad, x several minutes, intermittent single-B UE support on bar, CGA-mod assist x 1 Standing marches, emphasis on high-knees in // bars with B UE support, CGA 6x  Seated soccer ball kicks x multiple repetitions      Pt ambulated 380 ft this session with CGA- min assist x1 and wheelchair follow, where pt demonstrated wall-walking. Pt compensates with forward approach when performing lateral stepping in // bars. Pt continues to require frequent verbal cues for technique with exercises and CGA-mod assist x1 throughout. Pt will benefit from further skilled PT to improve B LE strength, balance, and gait in order to improve QOL and ease with ADLs.                  PT Education - 02/12/20 1616    Education Details Pt educated on technique with balloon toss in // bars.    Person(s) Educated Patient    Methods Demonstration;Explanation;Verbal cues;Tactile cues    Comprehension Returned demonstration            PT Short Term Goals - 01/20/20 1542      PT SHORT TERM GOAL #1   Title Patient/caregiver will report independence with HEP  with caregiver assistance (All STGs Due: 01/07/2020)    Baseline reports independence; only completing 1-2x/week per mom reports 01/20/20  patient's caregiver assisting patient with HEP.    Time 4    Period Weeks    Status On-going    Target Date 01/07/20      PT SHORT TERM GOAL #2   Title Patient will demo ability to complete transfer from w/c <> mat with CGA with LRAD to demonstrate improved functional mobility    Baseline intermittent CGA- Min A, 01/20/20  continues with intermittent need to transfer from WC<> mat no AD CGA    Time 4    Period Weeks    Status Partially Met    Target Date 01/07/20      PT SHORT TERM GOAL #3   Title Patient will demo ability to ambulate >/= 200 ft  with LRAD or hand hand assist with moderate assistance to demonstrate improved  functional mobility in home.    Baseline 115 ft max assist +2 hand held assist., 01/20/20  pt ambulating 175 ft with PT hand had assist in front of patient, no AD.    Time 4    Period Weeks    Status Partially Met             PT Long Term Goals - 01/20/20 1556      PT LONG TERM GOAL #1   Title Patient/caregiver will report indepence with final HEP with caregiver assistance (All LTGS: 02/04/2020)    Baseline no HEP established, 01/20/20  caregiver assisting PT with home program    Time 8    Period Weeks    Status On-going      PT LONG TERM GOAL #2   Title Patient will demo ability to complete all bed mobility Mod I to demonstrate improved independence    Baseline CGA/Supervision, 01/20/20 supervision, intermitten mod I to perform bed mobility    Time 8    Period Weeks    Status Partially Met      PT LONG TERM GOAL #3   Title Patient will demo ability to complete sit <> stand x 5 reps with supervision to demonstrate improved balance and functional mobility    Baseline CGA - Min A, 01/20/20  intermittent min A to perform sit to stand, sits from a standing position. CGA for safety    Time 8    Period Weeks    Status Partially Met      PT LONG TERM GOAL #4   Title Patient will demo ability to ambulate > 400 ft w/ LRAD versus hand held assist min assist to demonstrate improved household mobility and short community distances.    Baseline TBA, 01/20/20  167f with PT in front of patient, hand held assist, no AD    Time 8    Period Weeks    Status Partially Met                 Plan - 02/12/20 1615    Clinical Impression Statement Pt ambulated 380 ft this session with CGA- min assist x1 and wheelchair follow, where pt demonstrated wall-walking. Pt compensates with forward approach when performing lateral stepping in // bars. Pt continues to require frequent verbal cues for technique with exercises and CGA-mod assist x1 throughout. Pt will benefit from further skilled PT to  improve B LE strength, balance, and gait in order to improve QOL and ease with ADLs.    Personal Factors and Comorbidities Comorbidity 1;Behavior Pattern;Transportation  Comorbidities HTN    Examination-Activity Limitations Bed Mobility;Dressing;Hygiene/Grooming;Caring for Others;Stairs;Stand;Toileting;Transfers;Sit;Bathing    Examination-Participation Restrictions Occupation;Community Activity;Medication Management;Cleaning    Stability/Clinical Decision Making Evolving/Moderate complexity    Rehab Potential Good    PT Frequency 2x / week    PT Duration 8 weeks    PT Treatment/Interventions ADLs/Self Care Home Management;Aquatic Therapy;Electrical Stimulation;DME Instruction;Gait training;Stair training;Functional mobility training;Therapeutic activities;Therapeutic exercise;Balance training;Neuromuscular re-education;Wheelchair mobility training;Patient/family education;Orthotic Fit/Training;Manual techniques;Passive range of motion    PT Next Visit Plan FYI- Pt does have feeding tube still. Pt is impulsive. Continue to work on seated and standing balance, BLE strengthening, coordination activities, core strengthening. Gait training with hand held assist of 2 people. Did well with 1 person in front and 1 in back with decreasing support today.    Consulted and Agree with Plan of Care Patient;Family member/caregiver    Family Member Consulted Mom/Dad           Patient will benefit from skilled therapeutic intervention in order to improve the following deficits and impairments:  Abnormal gait,Decreased balance,Decreased endurance,Decreased mobility,Difficulty walking,Impaired tone,Impaired sensation,Pain,Impaired UE functional use,Decreased strength,Decreased safety awareness,Decreased knowledge of use of DME,Decreased coordination,Decreased activity tolerance,Decreased cognition,Decreased range of motion  Visit Diagnosis: Unsteadiness on feet  Muscle weakness (generalized)  Other lack of  coordination  Anoxic brain injury ([place])  Other abnormalities of gait and mobility     Problem List Patient Active Problem List   Diagnosis Date Noted  . Oropharyngeal dysphagia 02/12/2020  . Coronary artery disease involving native coronary artery of native heart without angina pectoris 12/12/2019  . Ischemic cardiomyopathy 12/12/2019  . Brain injury ([place])   . Prediabetes   . Anoxic brain injury ([place]) 11/05/2019  . Dysphagia 11/05/2019  . Physical deconditioning 11/05/2019  . Acute delirium 11/05/2019  . Respiratory failure ([place])   . Acute metabolic encephalopathy   . Encounter for central line placement   . Ventricular fibrillation ([place]) 09/23/2019  . Acute combined systolic and diastolic heart failure ([place]) 09/23/2019  . Ventricular tachycardia, polymorphic ([place]) 09/23/2019  . Pelvic pain affecting pregnancy 10/15/2015  . Supervision of normal pregnancy in third trimester 09/28/2015  . Poor weight gain of pregnancy 09/28/2015  . Iron deficiency anemia of pregnancy 09/01/2015  . Increased BMI (body mass index) 07/29/2015    Hal Morales PT, DPT  02/12/2020, 4:21 PM  [place] MAIN [facility] SERVICES [address] [place], [place], [address] Phone: [phone]   Fax:  [phone]  Name: Beverly [name] MRN: [id number] Date of Birth: [birth year]

## 2020-02-17 ENCOUNTER — Ambulatory Visit: Payer: Medicaid Other

## 2020-02-17 ENCOUNTER — Other Ambulatory Visit: Payer: Self-pay

## 2020-02-17 ENCOUNTER — Ambulatory Visit: Payer: Medicaid Other | Admitting: Speech Pathology

## 2020-02-17 ENCOUNTER — Encounter: Payer: Self-pay | Admitting: Occupational Therapy

## 2020-02-17 ENCOUNTER — Ambulatory Visit: Payer: Medicaid Other | Admitting: Occupational Therapy

## 2020-02-17 ENCOUNTER — Telehealth: Payer: Self-pay

## 2020-02-17 DIAGNOSIS — R41841 Cognitive communication deficit: Secondary | ICD-10-CM

## 2020-02-17 DIAGNOSIS — M6281 Muscle weakness (generalized): Secondary | ICD-10-CM

## 2020-02-17 DIAGNOSIS — R278 Other lack of coordination: Secondary | ICD-10-CM

## 2020-02-17 DIAGNOSIS — G931 Anoxic brain damage, not elsewhere classified: Secondary | ICD-10-CM

## 2020-02-17 DIAGNOSIS — R2689 Other abnormalities of gait and mobility: Secondary | ICD-10-CM

## 2020-02-17 DIAGNOSIS — R262 Difficulty in walking, not elsewhere classified: Secondary | ICD-10-CM

## 2020-02-17 DIAGNOSIS — R2681 Unsteadiness on feet: Secondary | ICD-10-CM

## 2020-02-17 NOTE — Telephone Encounter (Signed)
Patient mother called asking is she can mix Amantadine with her Ensure to put in her feeding tube?

## 2020-02-17 NOTE — Therapy (Signed)
[place] MAIN [facility] SERVICES [address] [place], [place], [address] Phone: [phone]   Fax:  [phone]  Speech Language Pathology Treatment and Progress Note  Patient Details  Name: Beverly [name] MRN: [id number] Date of Birth: Jan 09, 1986 Referring Provider (SLP): Dr. Wendee Beavers  Speech Therapy Progress Note  Dates of Reporting Period: 12/10/19 to 02/17/20  Patient has been seen for 10 speech therapy sessions targeting cognitive communication deficits, dysarthria, and dysphagia. Progressing toward goals; see clinical impressions and goals below for details.  Encounter Date: 02/17/2020   End of Session - 02/17/20 1659    Visit Number 10    Number of Visits 25    Date for SLP Re-Evaluation 03/03/20    Authorization Time Period 11 ST visits after 1/1    Authorization - Visit Number 2    Authorization - Number of Visits 11    Progress Note Due on Visit 10    SLP Start Time 0263    SLP Stop Time  1430    SLP Time Calculation (min) 53 min    Activity Tolerance Patient tolerated treatment well           Past Medical History:  Diagnosis Date  . Brain injury ([place])   . CAD (coronary artery disease)   . HLD (hyperlipidemia)   . Hypertension    last pregnancy  . Ischemic cardiomyopathy   . STEMI (ST elevation myocardial infarction) ([place]) 09/2019   SCAD with aneurysmal dilation of proximal LAD.  . Vaginal Pap smear, abnormal    when she was 35yo  . Ventricular fibrillation ([place]) 09/23/2019    Past Surgical History:  Procedure Laterality Date  . IR GASTROSTOMY TUBE MOD SED  10/07/2019  . LEFT HEART CATH AND CORONARY ANGIOGRAPHY N/A 09/23/2019   Procedure: LEFT HEART CATH AND CORONARY ANGIOGRAPHY;  Surgeon: Nelva Bush, MD;  Location: [place] [facility];  Service: Cardiovascular;  Laterality: N/A;    There were no vitals filed for this visit.   Subjective Assessment - 02/17/20 1653    Subjective Pt was somewhat drowsy  today (SLP was first session).    Patient is accompained by: Family member   [name]   Currently in Pain? No/denies                 ADULT SLP TREATMENT - 02/17/20 1654      General Information   Behavior/Cognition Alert;Cooperative;Distractible;Pleasant mood;Requires cueing;Decreased sustained attention    HPI Pt is a 35 y.o. female with PMH of HTN who was admitted 09/23/19 2 weeks post partum after emergent C section due to rupture of membranes with VF arrest. Pt with enterobacter PNA and encephalopathy, trach placed 8/30 and PEG on 8/31. MRI 8/31: No evidence of anoxic brain injury. Pt changed to #4 cuffless trach on 9/15; capped on 9/21 and decannulated on 9/25. MBS 9/22, POs started - primary oral dysphagia.   Pt currently using PEG as primary nutrition and hydration and for some meds, due to behavior.      Treatment Provided   Treatment provided Cognitive-Linquistic      Pain Assessment   Pain Assessment No/denies pain      Cognitive-Linquistic Treatment   Treatment focused on Cognition;Patient/family/caregiver education    Skilled Treatment Continued customization of memory book for patient to aid simple biographical and functional recall. Patient able to name simple details with usual cues to slow rate of speech when stating family members birthdays, names of friends and places  she's worked. Pt read details of her brain injury story out loud; unintelligible unless cued for slow rate. Did not bring a book today, but mom reports pt's daughter wanted pt to read to her but "she couldn't understand her." To bring book next time. Simple attention tasks symbol matching 79% accuracy, alternating symbols 94% accuracy. After approximately 10 minutes of working on tabletop tasks, accuracy decreased with more frequent cues necessary for sustained attention; accuracy for pattern repetition and picture recall was ~60%.      Assessment / Recommendations / Plan   Plan Continue with current  plan of care      Progression Toward Goals   Progression toward goals Progressing toward goals            SLP Education - 02/17/20 1659    Education Details memory book for functional recall    Person(s) Educated Patient;Parent(s)    Methods Explanation    Comprehension Verbalized understanding            SLP Short Term Goals - 02/17/20 1702      SLP SHORT TERM GOAL #1   Title Beverly [name] will sustain attention for 5 minutes to simple cognitive task with occasional min A over 3 sessions    Baseline 1-2 minutes    Time 5    Period Weeks    Status Partially Met      SLP SHORT TERM GOAL #2   Title Pt will verbalize 2 safety rules for walking at home with occasional min A over 3 sessions    Baseline pt states she can walk by herself    Time 5    Period Weeks    Status Partially Met      SLP SHORT TERM GOAL #3   Title Pt will use simple external aid to follow simple schedule and prepare for appointments with usual min A from family    Baseline does not recall or prepare for appointments    Time 5    Period Weeks    Status Partially Met      SLP SHORT TERM GOAL #4   Title Pt will use visual and written word cues to wash her face with usual min verbal cues from mom over 3 sessions    Baseline Per mom, pt only touches her face 2-3 times when asked to wash her face    Time 5    Period Weeks    Status Achieved            SLP Long Term Goals - 02/17/20 1702      SLP LONG TERM GOAL #1   Title Pt will use external aids/to do list to complete 3 simple chores a day with occasional min A over 3 sessions    Time 11    Period Weeks    Status On-going      SLP LONG TERM GOAL #2   Title Pt will sustain attention for 10 minutes to simple cognitive task with 4 or less re-directions over 3 sessions    Baseline sustains attention for 1-2 minutes    Time 11    Period Weeks    Status On-going      SLP LONG TERM GOAL #3   Title Pt will use visual cues and schedule to eat 2 meals  a day and drink 24 oz of liquid with usual min A over 3 sessions    Baseline pt eating less than 1 meal a day and taking only sips (PEG  is used)    Time 11    Period Weeks    Status On-going      SLP LONG TERM GOAL #4   Title Pt will use reduced rate to be intellgible in 5 minute conversation with occasional min A    Baseline Rapid rate, rushes of speech, 35% of utterances unintelligible    Time 11    Period Weeks    Status On-going            Plan - 02/17/20 1700    Clinical Impression Statement Beverly [name] was more fatigued in session today, required cues for alertness x3. Able to recall more personal details this session when contributing to her memory book. Sustained attention remains significantly impaired; per MD note Dr. Posey Pronto considering Ritalin, which may assist with attention deficits. Continue skilled ST to maximize cognition for improved oral intake, safety, and quality of life.    Speech Therapy Frequency 2x / week    Duration 12 weeks   or 25 visits   Treatment/Interventions Language facilitation;Environmental controls;Cueing hierarchy;SLP instruction and feedback;Aspiration precaution training;Compensatory techniques;Cognitive reorganization;Functional tasks;Compensatory strategies;Diet toleration management by SLP;Trials of upgraded texture/liquids;Internal/external aids;Multimodal communcation approach;Patient/family education    Potential to Achieve Goals Fair    Potential Considerations Severity of impairments;Financial resources           Patient will benefit from skilled therapeutic intervention in order to improve the following deficits and impairments:   Cognitive communication deficit    Problem List Patient Active Problem List   Diagnosis Date Noted  . Oropharyngeal dysphagia 02/12/2020  . Coronary artery disease involving native coronary artery of native heart without angina pectoris 12/12/2019  . Ischemic cardiomyopathy 12/12/2019  . Brain injury ([place])    . Prediabetes   . Anoxic brain injury ([place]) 11/05/2019  . Dysphagia 11/05/2019  . Physical deconditioning 11/05/2019  . Acute delirium 11/05/2019  . Respiratory failure ([place])   . Acute metabolic encephalopathy   . Encounter for central line placement   . Ventricular fibrillation ([place]) 09/23/2019  . Acute combined systolic and diastolic heart failure ([place]) 09/23/2019  . Ventricular tachycardia, polymorphic ([place]) 09/23/2019  . Pelvic pain affecting pregnancy 10/15/2015  . Supervision of normal pregnancy in third trimester 09/28/2015  . Poor weight gain of pregnancy 09/28/2015  . Iron deficiency anemia of pregnancy 09/01/2015  . Increased BMI (body mass index) 07/29/2015   Deneise Lever, [place], CCC-SLP Speech-Language Pathologist  Beverly [name] 02/17/2020, 5:03 PM  [place] MAIN [facility] SERVICES [address] [place], [place], [address] Phone: [phone]   Fax:  [phone]   Name: Beverly [name] MRN: [id number] Date of Birth: 09-Jan-1986

## 2020-02-17 NOTE — Therapy (Signed)
[place] [facility] [address] [place], [place], [address] Phone: [phone]   Fax:  [phone]  Occupational Therapy Progress Note  Dates of reporting period  12/23/2019   to   02/17/2020  Patient Details  Name: Beverly BrunsShatoya A Toor MRN: [id number] Date of Birth: 11/16/1985 Referring Provider (OT): Candelaria Stagersaye Gonfa MD   Encounter Date: 02/17/2020   OT End of Session - 02/17/20 1718    Visit Number 10    Number of Visits 24    Date for OT Re-Evaluation 03/16/20    Authorization Type Medicaid OON    OT Start Time 1430    OT Stop Time 1515    OT Time Calculation (min) 45 min    Activity Tolerance Patient tolerated treatment well    Behavior During Therapy Impulsive           Past Medical History:  Diagnosis Date  . Brain injury (HCC)   . CAD (coronary artery disease)   . HLD (hyperlipidemia)   . Hypertension    last pregnancy  . Ischemic cardiomyopathy   . STEMI (ST elevation myocardial infarction) (HCC) 09/2019   SCAD with aneurysmal dilation of proximal LAD.  . Vaginal Pap smear, abnormal    when she was 35yo  . Ventricular fibrillation (HCC) 09/23/2019    Past Surgical History:  Procedure Laterality Date  . IR GASTROSTOMY TUBE MOD SED  10/07/2019  . LEFT HEART CATH AND CORONARY ANGIOGRAPHY N/A 09/23/2019   Procedure: LEFT HEART CATH AND CORONARY ANGIOGRAPHY;  Surgeon: Yvonne KendallEnd, Christopher, MD;  Location: [facility] INVASIVE CV LAB;  Service: Cardiovascular;  Laterality: N/A;    There were no vitals filed for this visit.       [facility] - 02/17/20 1448      Coordination   9 Hole Peg Test --   Bilateral hands: 5 pegs in 3 min. &30 sec.     Hand Function   Right Hand Grip (lbs) 48    Right Hand Lateral Pinch 12 lbs    Right Hand 3 Point Pinch 9 lbs    Left Hand Grip (lbs) 25    Left Hand Lateral Pinch 12 lbs    Left 3 point pinch 12 lbs          Pt.'s mother expressed concern that the pt. Is unable to  swallow her food, and has not been able to eat in several days. Pt.'s mother reports having a call into the physician, and is requesting information about seeing a Nutritionist/Dietician. Pt. Was [facility] with [facility].  Pt. A request for bilateral hand splints was submitted through [facility]. Pt. Mother reports that the current splints received in the hospital for nighttime wearing are too big. Pt. has made steady progress. Pt. Is now able to engage her bilateral hands during tasks more. Pt. was able to place 5 pegs into the 9 hole peg test using bilateral hands to stabilize in 3 min & 30 sec.  Pt. improved with the right grip strength by 28 # of force,, and improved with bilateral pinch strength. Pt. presented with a decrease in left grip strength.  Pt. has improved overall with UE dressing, oral care, and washing her face. Pt.'s mother reports that the pt. Has a new aide. She expressed concern that the new aide is performing the tasks for her, instead of letting the pt. do as much as she can on her own, and then  helping  If needed. Pt. Requires increased time to complete. Pt. Is able to formulate several letters in her name. Pt. Continues to work towards improving bilateral hand function, and [facility] skills in order to improve bilateral UE engagement in ADLs, and IADL tasks.                     OT Short Term Goals - 02/17/20 1503      OT SHORT TERM GOAL #1   Title Pt/ caregiver will be I with inital HEP.    Baseline Independent    Status Achieved      OT SHORT TERM GOAL #2   Period Weeks      OT SHORT TERM GOAL #3   Baseline 1 min with re-direction      OT SHORT TERM GOAL #4   Title Pt will wash her face and brush her teeth with no more than min v.c for thoroughness    Baseline Independent with verbal cues, and set-up.    Time 6    Period Weeks    Status On-going      OT SHORT TERM GOAL #6   Title Pt will demonstrate ability to  sequence a simple functional or ADL task with no more than min v.c    Baseline Pt requires grossly min-modA with sequencing simple activities    Period Weeks    Status On-going             OT Long Term Goals - 02/17/20 1510      OT LONG TERM GOAL #1   Title Pt will perform LB bathing and dressing with no more than min A.    Baseline ModA    Time 12    Period Weeks    Status On-going    Target Date 03/16/20      OT LONG TERM GOAL #2   Title Pt will demonstrate improved bilateral UE functional use by increasing box/ blocks score by 6 blocks from inital measurement.    Baseline RUE 10 blocks, LUE 12 blocks    Time 12    Status Deferred      OT LONG TERM GOAL #3   Title Pt will perform simple cold snack prep with min A.    Baseline dependent    Time 12    Period Weeks    Status On-going    Target Date 03/16/20      OT LONG TERM GOAL #4   Title Pt will attend to an ADL/ simple functional familiar task for 10 mins.    Baseline attends for grossly 1 min with re-direct.    Time 12    Period Weeks    Status On-going      OT LONG TERM GOAL #5   Title Pt perform a simple home management task with no more than min A, demonstrating good safety awareness.    Baseline dependent    Time 12    Period Weeks    Status On-going    Target Date 03/16/20      OT LONG TERM GOAL #6   Title Pt will write her name with 75% legibility    Baseline Pt. is able to formulate several letters of her name.    Time 13    Period Weeks    Status On-going    Target Date 03/16/20                 Plan - 02/17/20 1700  Clinical Impression Statement Pt.'s mother expressed concern that the pt. Is unable to swallow her food, and has not been able to eat in several days. Pt.'s mother reports having a call into the physician, and is requesting information about seeing a Nutritionist/Dietician. Pt. Was provided with the [facility] to [facility].  Pt. A request for bilateral hand  splints was submitted through [facility]. Pt. Mother reports that the current splints received in the hospital for nighttime wearing are too big. Pt. has made steady progress. Pt. Is now able to engage her bilateral hands during tasks more. Pt. was able to place 5 pegs into the 9 hole peg test using bilateral hands to stabilize in 3 min & 30 sec.  Pt. improved with the right grip strength by 28 # of force,, and improved with bilateral pinch strength. Pt. presented with a decrease in left grip strength.  Pt. has improved overall with UE dressing, oral care, and washing her face. Pt.'s mother reports that the pt. Has a new aide. She expressed concern that the new aide is performing the tasks for her, instead of letting the pt. do as much as she can on her own, and then helping  If needed. Pt. Requires increased time to complete. Pt. Is able to formulate several letters in her name. Pt. Continues to work towards improving bilateral hand function, and [facility] skills in order to improve bilateral UE engagement in ADLs, and IADL tasks.    OT Occupational Profile and History Detailed Assessment- Review of Records and additional review of physical, cognitive, psychosocial history related to current functional performance    Occupational performance deficits (Please refer to evaluation for details): ADL's;IADL's;Work;Leisure;Social Participation    Body Structure / Function / Physical Skills ADL;Endurance;UE functional use;Balance;Pain;Flexibility;FMC;ROM;GMC;Coordination;Decreased knowledge of precautions;Decreased knowledge of use of DME;IADL;Dexterity;Strength;Mobility;Tone;Vision;Sensation;Gait    Cognitive Skills Attention;Energy/Drive;Memory;Orientation;Problem Solve;Safety Awareness;Thought;Understand    Rehab Potential Good    Clinical Decision Making Several treatment options, min-mod task modification necessary    Comorbidities Affecting Occupational Performance: May have comorbidities impacting  occupational performance    Modification or Assistance to Complete Evaluation  Min-Moderate modification of tasks or assist with assess necessary to complete eval    OT Frequency 2x / week    OT Duration 12 weeks    OT Treatment/Interventions Self-care/ADL training;Ultrasound;Visual/perceptual remediation/compensation;Patient/family education;Scar mobilization;Aquatic Therapy;Paraffin;Passive range of motion;Balance training;Dry needling;Stair Training;Fluidtherapy;Cryotherapy;Splinting;Moist Heat;Manual Therapy;Therapeutic exercise;Therapeutic activities;Functional Mobility Training;Neuromuscular education;Cognitive remediation/compensation    Consulted and Agree with Plan of Care Patient;Family member/caregiver           Patient will benefit from skilled therapeutic intervention in order to improve the following deficits and impairments:   Body Structure / Function / Physical Skills: ADL,Endurance,UE functional use,Balance,Pain,Flexibility,FMC,ROM,GMC,Coordination,Decreased knowledge of precautions,Decreased knowledge of use of DME,IADL,Dexterity,Strength,Mobility,Tone,Vision,Sensation,Gait Cognitive Skills: Attention,Energy/Drive,Memory,Orientation,Problem Solve,Safety Awareness,Thought,Understand     Visit Diagnosis: Muscle weakness (generalized)  Other lack of coordination    Problem List Patient Active Problem List   Diagnosis Date Noted  . Oropharyngeal dysphagia 02/12/2020  . Coronary artery disease involving native coronary artery of native heart without angina pectoris 12/12/2019  . Ischemic cardiomyopathy 12/12/2019  . Brain injury (HCC)   . Prediabetes   . Anoxic brain injury (HCC) 11/05/2019  . Dysphagia 11/05/2019  . Physical deconditioning 11/05/2019  . Acute delirium 11/05/2019  . Respiratory failure (HCC)   . Acute metabolic encephalopathy   . Encounter for central line placement   . Ventricular fibrillation (HCC) 09/23/2019  . Acute combined systolic and  diastolic heart failure (  HCC) 09/23/2019  . Ventricular tachycardia, polymorphic (HCC) 09/23/2019  . Pelvic pain affecting pregnancy 10/15/2015  . Supervision of normal pregnancy in third trimester 09/28/2015  . Poor weight gain of pregnancy 09/28/2015  . Iron deficiency anemia of pregnancy 09/01/2015  . Increased BMI (body mass index) 07/29/2015    Olegario Messier, MS, OTR/L 02/17/2020, 5:20 PM  [facility] MAIN [facility] SERVICES [address] [place], [place], [address] Phone: [phone]   Fax:  [phone]  Name: HEYLI MIN MRN: [id number] Date of Birth: Aug 26, 1985

## 2020-02-17 NOTE — Telephone Encounter (Signed)
Patient mother has been notified.

## 2020-02-17 NOTE — Therapy (Signed)
[place] MAIN [facility] SERVICES [address] [place], [place], [address] Phone: [phone]   Fax:  [phone]  Physical Therapy Treatment  Patient Details  Name: Beverly [name] MRN: [id number] Date of Birth: [birth year] Referring Provider (PT): Mercy Riding, MD   Encounter Date: 02/17/2020   PT End of Session - 02/17/20 1614    Visit Number 14    Number of Visits 17    PT Start Time 0318    PT Stop Time 0400    PT Time Calculation (min) 42 min    Equipment Utilized During Treatment Gait belt    Activity Tolerance Patient tolerated treatment well    Behavior During Therapy Impulsive           Past Medical History:  Diagnosis Date  . Brain injury ([place])   . CAD (coronary artery disease)   . HLD (hyperlipidemia)   . Hypertension    last pregnancy  . Ischemic cardiomyopathy   . STEMI (ST elevation myocardial infarction) ([place]) 09/2019   SCAD with aneurysmal dilation of proximal LAD.  . Vaginal Pap smear, abnormal    when she was 35yo  . Ventricular fibrillation ([place]) 09/23/2019    Past Surgical History:  Procedure Laterality Date  . IR GASTROSTOMY TUBE MOD SED  10/07/2019  . LEFT HEART CATH AND CORONARY ANGIOGRAPHY N/A 09/23/2019   Procedure: LEFT HEART CATH AND CORONARY ANGIOGRAPHY;  Surgeon: Nelva Bush, MD;  Location: [place] CV LAB;  Service: Cardiovascular;  Laterality: N/A;    There were no vitals filed for this visit.   Subjective Assessment - 02/17/20 1613    Subjective pt states she is doing ok.    Patient is accompained by: Family member   mom   Pertinent History HTN    Limitations Sitting;Standing;Walking;House hold activities    Patient Stated Goals walk better    Currently in Pain? No/denies    Pain Score 0-No pain          Ambulation200 ft with PT CGA-Min assist x1 and wheelchair follow.  Pt reporting fatigue  Ambulation in // bars forward/backward 2x each with B UE support and CGA  Ball  toss in // bars, WBOS on airex pad, x several minutes, intermittent single-B UE support on bar, CGA-mod assist x 1 cueing required to not use UE support Standing marches, emphasis on high-knees in // bars with B UE support, CGA 6x  Standing soccer ball kicks x multiple repetitions   Sit to stand onto foam pad min-mod CGA x10 cueing required for correct exercise technique  Patient required intermittent rest breaks during session.  Patient turned at end of session and her knees buckled and she started to lower into a high kneeling position on the floor.  PT provided mod CGA to slowly lower into the hight kneeling position.   Mod A x2 for lifting and verbal cueing for foot placement for the patient to return to standing.  No pain/injury reported.  Discussed episode with patient's parent.                              PT Education - 02/17/20 1613    Education Details exercise technique, slowing down walking    Person(s) Educated Patient    Methods Explanation;Tactile cues;Verbal cues;Demonstration    Comprehension Verbalized understanding;Returned demonstration;Verbal cues required;Tactile cues required;Need further instruction            PT  Short Term Goals - 01/20/20 1542      PT SHORT TERM GOAL #1   Title Patient/caregiver will report independence with HEP with caregiver assistance (All STGs Due: 01/07/2020)    Baseline reports independence; only completing 1-2x/week per mom reports 01/20/20  patient's caregiver assisting patient with HEP.    Time 4    Period Weeks    Status On-going    Target Date 01/07/20      PT SHORT TERM GOAL #2   Title Patient will demo ability to complete transfer from w/c <> mat with CGA with LRAD to demonstrate improved functional mobility    Baseline intermittent CGA- Min A, 01/20/20  continues with intermittent need to transfer from WC<> mat no AD CGA    Time 4    Period Weeks    Status Partially Met    Target Date 01/07/20      PT  SHORT TERM GOAL #3   Title Patient will demo ability to ambulate >/= 200 ft  with LRAD or hand hand assist with moderate assistance to demonstrate improved functional mobility in home.    Baseline 115 ft max assist +2 hand held assist., 01/20/20  pt ambulating 175 ft with PT hand had assist in front of patient, no AD.    Time 4    Period Weeks    Status Partially Met             PT Long Term Goals - 01/20/20 1556      PT LONG TERM GOAL #1   Title Patient/caregiver will report indepence with final HEP with caregiver assistance (All LTGS: 02/04/2020)    Baseline no HEP established, 01/20/20  caregiver assisting PT with home program    Time 8    Period Weeks    Status On-going      PT LONG TERM GOAL #2   Title Patient will demo ability to complete all bed mobility Mod I to demonstrate improved independence    Baseline CGA/Supervision, 01/20/20 supervision, intermitten mod I to perform bed mobility    Time 8    Period Weeks    Status Partially Met      PT LONG TERM GOAL #3   Title Patient will demo ability to complete sit <> stand x 5 reps with supervision to demonstrate improved balance and functional mobility    Baseline CGA - Min A, 01/20/20  intermittent min A to perform sit to stand, sits from a standing position. CGA for safety    Time 8    Period Weeks    Status Partially Met      PT LONG TERM GOAL #4   Title Patient will demo ability to ambulate > 400 ft w/ LRAD versus hand held assist min assist to demonstrate improved household mobility and short community distances.    Baseline TBA, 01/20/20  126f with PT in front of patient, hand held assist, no AD    Time 8    Period Weeks    Status Partially Met                 Plan - 02/17/20 1614    Clinical Impression Statement The patient gait continues to be improved with less ataxic gait pattern as movements are becoming more coordinated.  The patient appeared fatigued today having both SLP and OT prior to her PT  visit.  The patient required multiple verbal and tactile cueing as well as demonstration for correct exercise technique.  The patient continues  to benefit from additional PT services to improve coordination, balance and gait for improved quality of life.    Personal Factors and Comorbidities Comorbidity 1;Behavior Pattern;Transportation    Comorbidities HTN    Examination-Activity Limitations Bed Mobility;Dressing;Hygiene/Grooming;Caring for Others;Stairs;Stand;Toileting;Transfers;Sit;Bathing    Examination-Participation Restrictions Occupation;Medication Management;Cleaning    Stability/Clinical Decision Making Evolving/Moderate complexity    Rehab Potential Good    PT Frequency 2x / week    PT Duration 8 weeks    PT Treatment/Interventions ADLs/Self Care Home Management;Aquatic Therapy;Electrical Stimulation;DME Instruction;Gait training;Stair training;Functional mobility training;Therapeutic activities;Therapeutic exercise;Balance training;Neuromuscular re-education;Wheelchair mobility training;Patient/family education;Orthotic Fit/Training;Manual techniques;Passive range of motion    PT Next Visit Plan FYI- Pt does have feeding tube still. Pt is impulsive. Continue to work on seated and standing balance, BLE strengthening, coordination activities, core strengthening. Gait training with hand held assist of 2 people. Did well with 1 person in front and 1 in back with decreasing support today.    Consulted and Agree with Plan of Care Patient;Family member/caregiver    Family Member Consulted Mom/Dad           Patient will benefit from skilled therapeutic intervention in order to improve the following deficits and impairments:  Abnormal gait,Decreased balance,Decreased endurance,Decreased mobility,Difficulty walking,Impaired tone,Impaired sensation,Pain,Impaired UE functional use,Decreased strength,Decreased safety awareness,Decreased knowledge of use of DME,Decreased coordination,Decreased  activity tolerance,Decreased cognition,Decreased range of motion  Visit Diagnosis: Unsteadiness on feet  Muscle weakness (generalized)  Other lack of coordination  Anoxic brain injury ([place])  Other abnormalities of gait and mobility  Cognitive communication deficit  Difficulty in walking, not elsewhere classified     Problem List Patient Active Problem List   Diagnosis Date Noted  . Oropharyngeal dysphagia 02/12/2020  . Coronary artery disease involving native coronary artery of native heart without angina pectoris 12/12/2019  . Ischemic cardiomyopathy 12/12/2019  . Brain injury ([place])   . Prediabetes   . Anoxic brain injury ([place]) 11/05/2019  . Dysphagia 11/05/2019  . Physical deconditioning 11/05/2019  . Acute delirium 11/05/2019  . Respiratory failure ([place])   . Acute metabolic encephalopathy   . Encounter for central line placement   . Ventricular fibrillation ([place]) 09/23/2019  . Acute combined systolic and diastolic heart failure ([place]) 09/23/2019  . Ventricular tachycardia, polymorphic ([place]) 09/23/2019  . Pelvic pain affecting pregnancy 10/15/2015  . Supervision of normal pregnancy in third trimester 09/28/2015  . Poor weight gain of pregnancy 09/28/2015  . Iron deficiency anemia of pregnancy 09/01/2015  . Increased BMI (body mass index) 07/29/2015    Hal Morales PT, DPT 02/17/2020, 4:34 PM  [place] MAIN [facility] SERVICES [address] [place], [place], [address] Phone: [phone]   Fax:  [phone]  Name: Beverly [name] MRN: [id number] Date of Birth: October 18, 1985

## 2020-02-17 NOTE — Telephone Encounter (Signed)
It is a liquid and can go directly into the tube. Thanks.

## 2020-02-19 ENCOUNTER — Other Ambulatory Visit: Payer: Self-pay

## 2020-02-19 ENCOUNTER — Ambulatory Visit: Payer: Medicaid Other

## 2020-02-19 ENCOUNTER — Ambulatory Visit: Payer: Medicaid Other | Admitting: Speech Pathology

## 2020-02-19 ENCOUNTER — Encounter: Payer: Medicaid Other | Admitting: Occupational Therapy

## 2020-02-19 DIAGNOSIS — R2681 Unsteadiness on feet: Secondary | ICD-10-CM

## 2020-02-19 DIAGNOSIS — R2689 Other abnormalities of gait and mobility: Secondary | ICD-10-CM

## 2020-02-19 DIAGNOSIS — R262 Difficulty in walking, not elsewhere classified: Secondary | ICD-10-CM

## 2020-02-19 DIAGNOSIS — R278 Other lack of coordination: Secondary | ICD-10-CM

## 2020-02-19 DIAGNOSIS — G931 Anoxic brain damage, not elsewhere classified: Secondary | ICD-10-CM

## 2020-02-19 DIAGNOSIS — R41841 Cognitive communication deficit: Secondary | ICD-10-CM | POA: Diagnosis not present

## 2020-02-19 DIAGNOSIS — R471 Dysarthria and anarthria: Secondary | ICD-10-CM

## 2020-02-19 DIAGNOSIS — M6281 Muscle weakness (generalized): Secondary | ICD-10-CM

## 2020-02-19 NOTE — Therapy (Signed)
[facility] [address] , [place], [address] Phone: [phone]   Fax:  [phone]  Physical Therapy Treatment  Patient Details  Name: Beverly [name] MRN: [id number] Date of Birth: 03/09/1985 Referring Provider (PT): Gonfa, Taye T, MD   Encounter Date: 02/19/2020   PT End of Session - 02/19/20 1643    Visit Number 15   3/12 for 2022   Number of Visits 17    PT Start Time 0355    PT Stop Time 0440    PT Time Calculation (min) 45 min    Equipment Utilized During Treatment Gait belt    Activity Tolerance Patient tolerated treatment well    Behavior During Therapy Impulsive           Past Medical History:  Diagnosis Date  . Brain injury (HCC)   . CAD (coronary artery disease)   . HLD (hyperlipidemia)   . Hypertension    last pregnancy  . Ischemic cardiomyopathy   . STEMI (ST elevation myocardial infarction) (HCC) 09/2019   SCAD with aneurysmal dilation of proximal LAD.  . Vaginal Pap smear, abnormal    when she was 35yo  . Ventricular fibrillation (HCC) 09/23/2019    Past Surgical History:  Procedure Laterality Date  . IR GASTROSTOMY TUBE MOD SED  10/07/2019  . LEFT HEART CATH AND CORONARY ANGIOGRAPHY N/A 09/23/2019   Procedure: LEFT HEART CATH AND CORONARY ANGIOGRAPHY;  Surgeon: End, Christopher, MD;  Location: [facility] INVASIVE CV LAB;  Service: Cardiovascular;  Laterality: N/A;    There were no vitals filed for this visit.   Subjective Assessment - 02/19/20 1642    Subjective patient reports she is doing good, not tired.    Patient is accompained by: Family member   mom   Pertinent History HTN    Limitations Sitting;Standing;Walking;House hold activities    Patient Stated Goals walk better    Currently in Pain? No/denies    Pain Score 0-No pain           Ambulation 150 ft with PT CGA-Min assist x1 and wheelchair follow.  Pt reporting fatigue requiring rest break, patient able to continue to  ambulate further after short rest.     Ambulation in // bars forward/backward 2x each with B UE support and CGA   Ball toss in // bars, WBOS on airex pad, x several minutes, intermittent single-B UE support on bar, CGA-mod assist x 1 cueing required to not use UE support Standing marches, emphasis on high-knees in // bars with B UE support, CGA 6x  Standing soccer ball kicks x multiple repetitions    Sit to stand min-mod CGA x10 cueing required for correct exercise technique x10                            PT Education - 02/19/20 1642    Education Details exercise technique, posture    Person(s) Educated Patient    Methods Explanation;Demonstration;Tactile cues;Verbal cues    Comprehension Verbalized understanding;Returned demonstration;Tactile cues required;Need further instruction;Verbal cues required            PT Short Term Goals - 01/20/20 1542      PT SHORT TERM GOAL #1   Title Patient/caregiver will report independence with HEP with caregiver assistance (All STGs Due: 01/07/2020)    Baseline reports independence; only completing 1-2x/week per mom reports 01/20/20  patient's caregiver assisting patient with HEP.      Time 4    Period Weeks    Status On-going    Target Date 01/07/20      PT SHORT TERM GOAL #2   Title Patient will demo ability to complete transfer from w/c <> mat with CGA with LRAD to demonstrate improved functional mobility    Baseline intermittent CGA- Min A, 01/20/20  continues with intermittent need to transfer from WC<> mat no AD CGA    Time 4    Period Weeks    Status Partially Met    Target Date 01/07/20      PT SHORT TERM GOAL #3   Title Patient will demo ability to ambulate >/= 200 ft  with LRAD or hand hand assist with moderate assistance to demonstrate improved functional mobility in home.    Baseline 115 ft max assist +2 hand held assist., 01/20/20  pt ambulating 175 ft with PT hand had assist in front of patient, no AD.     Time 4    Period Weeks    Status Partially Met             PT Long Term Goals - 01/20/20 1556      PT LONG TERM GOAL #1   Title Patient/caregiver will report indepence with final HEP with caregiver assistance (All LTGS: 02/04/2020)    Baseline no HEP established, 01/20/20  caregiver assisting PT with home program    Time 8    Period Weeks    Status On-going      PT LONG TERM GOAL #2   Title Patient will demo ability to complete all bed mobility Mod I to demonstrate improved independence    Baseline CGA/Supervision, 01/20/20 supervision, intermitten mod I to perform bed mobility    Time 8    Period Weeks    Status Partially Met      PT LONG TERM GOAL #3   Title Patient will demo ability to complete sit <> stand x 5 reps with supervision to demonstrate improved balance and functional mobility    Baseline CGA - Min A, 01/20/20  intermittent min A to perform sit to stand, sits from a standing position. CGA for safety    Time 8    Period Weeks    Status Partially Met      PT LONG TERM GOAL #4   Title Patient will demo ability to ambulate > 400 ft w/ LRAD versus hand held assist min assist to demonstrate improved household mobility and short community distances.    Baseline TBA, 01/20/20  175ft with PT in front of patient, hand held assist, no AD    Time 8    Period Weeks    Status Partially Met                 Plan - 02/19/20 1644    Clinical Impression Statement Patient with difficulty ambulating while attempting to converse and as people were walking in the hall at the same time.  The patient more distracted this visit compared to previous.  Patient continue sto demonstrate improved mobility and coordination.  The patient continues to benefit from additional skilled PT intervention to improve coordination, movement control and balance for improved quality of life.    Personal Factors and Comorbidities Comorbidity 1;Behavior Pattern;Transportation    Comorbidities HTN     Examination-Activity Limitations Bed Mobility;Dressing;Hygiene/Grooming;Caring for Others;Stairs;Stand;Toileting;Transfers;Sit;Bathing    Examination-Participation Restrictions Occupation;Medication Management;Cleaning    Stability/Clinical Decision Making Evolving/Moderate complexity    Rehab Potential Good      PT Frequency 2x / week    PT Duration 8 weeks    PT Treatment/Interventions ADLs/Self Care Home Management;Aquatic Therapy;Electrical Stimulation;DME Instruction;Gait training;Stair training;Functional mobility training;Therapeutic activities;Therapeutic exercise;Balance training;Neuromuscular re-education;Wheelchair mobility training;Patient/family education;Orthotic Fit/Training;Manual techniques;Passive range of motion    PT Next Visit Plan FYI- Pt does have feeding tube still. Pt is impulsive. Continue to work on seated and standing balance, BLE strengthening, coordination activities, core strengthening. Gait training with hand held assist of 2 people. Did well with 1 person in front and 1 in back with decreasing support today.    Consulted and Agree with Plan of Care Patient;Family member/caregiver    Family Member Consulted Mom/Dad           Patient will benefit from skilled therapeutic intervention in order to improve the following deficits and impairments:  Abnormal gait,Decreased balance,Decreased endurance,Decreased mobility,Difficulty walking,Impaired tone,Impaired sensation,Pain,Impaired UE functional use,Decreased strength,Decreased safety awareness,Decreased knowledge of use of DME,Decreased coordination,Decreased activity tolerance,Decreased cognition,Decreased range of motion  Visit Diagnosis: Anoxic brain injury ([place])  Unsteadiness on feet  Muscle weakness (generalized)  Other lack of coordination  Other abnormalities of gait and mobility  Difficulty in walking, not elsewhere classified     Problem List Patient Active Problem List   Diagnosis Date Noted   . Oropharyngeal dysphagia 02/12/2020  . Coronary artery disease involving native coronary artery of native heart without angina pectoris 12/12/2019  . Ischemic cardiomyopathy 12/12/2019  . Brain injury ([place])   . Prediabetes   . Anoxic brain injury ([place]) 11/05/2019  . Dysphagia 11/05/2019  . Physical deconditioning 11/05/2019  . Acute delirium 11/05/2019  . Respiratory failure ([place])   . Acute metabolic encephalopathy   . Encounter for central line placement   . Ventricular fibrillation ([place]) 09/23/2019  . Acute combined systolic and diastolic heart failure ([place]) 09/23/2019  . Ventricular tachycardia, polymorphic ([place]) 09/23/2019  . Pelvic pain affecting pregnancy 10/15/2015  . Supervision of normal pregnancy in third trimester 09/28/2015  . Poor weight gain of pregnancy 09/28/2015  . Iron deficiency anemia of pregnancy 09/01/2015  . Increased BMI (body mass index) 07/29/2015    Hal Morales PT, DPT 02/19/2020, 4:54 PM  [place] [facility] MAIN [facility] SERVICES [address] [place], [place], [address] Phone: [phone]   Fax:  [phone]  Name: Beverly [name] MRN: [id number] Date of Birth: Oct 23, 1985

## 2020-02-19 NOTE — Therapy (Signed)
[place] MAIN [facility] SERVICES [address] [place], [place], [address] Phone: [phone]   Fax:  [phone]  Speech Language Pathology Treatment  Patient Details  Name: Beverly [name] MRN: [id number] Date of Birth: 17-Dec-1985 Referring Provider (SLP): Dr. Wendee Beavers   Encounter Date: 02/19/2020   End of Session - 02/19/20 1627    Visit Number 11    Number of Visits 25    Date for SLP Re-Evaluation 03/03/20    Authorization Time Period 11 ST visits after 1/1    Authorization - Visit Number 3    Authorization - Number of Visits 11    Progress Note Due on Visit 10    SLP Start Time 1500    SLP Stop Time  1548    SLP Time Calculation (min) 48 min    Activity Tolerance Patient tolerated treatment well           Past Medical History:  Diagnosis Date  . Brain injury ([place])   . CAD (coronary artery disease)   . HLD (hyperlipidemia)   . Hypertension    last pregnancy  . Ischemic cardiomyopathy   . STEMI (ST elevation myocardial infarction) ([place]) 09/2019   SCAD with aneurysmal dilation of proximal LAD.  . Vaginal Pap smear, abnormal    when she was 35yo  . Ventricular fibrillation ([place]) 09/23/2019    Past Surgical History:  Procedure Laterality Date  . IR GASTROSTOMY TUBE MOD SED  10/07/2019  . LEFT HEART CATH AND CORONARY ANGIOGRAPHY N/A 09/23/2019   Procedure: LEFT HEART CATH AND CORONARY ANGIOGRAPHY;  Surgeon: Nelva Bush, MD;  Location: [place] CV LAB;  Service: Cardiovascular;  Laterality: N/A;    There were no vitals filed for this visit.   Subjective Assessment - 02/19/20 1614    Subjective Patient able to help locate ST room today (with cues)    Patient is accompained by: Family member   Dad   Currently in Pain? No/denies                 ADULT SLP TREATMENT - 02/19/20 1615      General Information   Behavior/Cognition Alert;Cooperative;Distractible;Pleasant mood;Requires cueing;Decreased sustained  attention    HPI Pt is a 35 y.o. female with PMH of HTN who was admitted 09/23/19 2 weeks post partum after emergent C section due to rupture of membranes with VF arrest. Pt with enterobacter PNA and encephalopathy, trach placed 8/30 and PEG on 8/31. MRI 8/31: No evidence of anoxic brain injury. Pt changed to #4 cuffless trach on 9/15; capped on 9/21 and decannulated on 9/25. MBS 9/22, POs started - primary oral dysphagia.   Pt currently using PEG as primary nutrition and hydration and for some meds, due to behavior.      Treatment Provided   Treatment provided Cognitive-Linquistic      Pain Assessment   Pain Assessment No/denies pain      Cognitive-Linquistic Treatment   Treatment focused on Dysarthria;Patient/family/caregiver education    Skilled Treatment Focused session on pt's dysarthria and intelligibility today. Dad brought book from home that pt's daughter likes. Pt read personal details from her memory book (family, birthdays, previous jobs, care team, brain injury story, safety strategies) with usual mod-max A for slow rate, occasional mod cues for sustained attention. Benefitted from therapist tapping or pointing to words to isolate production. She smiled when praised for how much this improved her intelligibility. Used same cuing strategies as pt read brief selection from  her daughter's book; pt's sustained attention began to wane after 3 pages. Pt requested a break, and was given 2 minute rest period. Continued targeting dysarthria in simple cognitive-linguistic tasks by having pt verbally direct SLP to turn over cards for memory game (56% accuracy, 12 cards), verbally ordering a series of 3 words (60% accuracy), counting money (87% accuracy), and choosing "odd one out," (96% accuracy). By end of session able to decrease cuing for slower rate from usual (mod-max) to frequent (mod). Average attention to task 5 minutes.      Assessment / Recommendations / Plan   Plan Continue with current  plan of care      Progression Toward Goals   Progression toward goals Progressing toward goals            SLP Education - 02/19/20 1627    Education Details strategies to slow rate    Person(s) Educated Patient;Parent(s)    Methods Explanation;Demonstration;Tactile cues;Verbal cues    Comprehension Verbalized understanding;Returned demonstration;Verbal cues required;Tactile cues required;Need further instruction            SLP Short Term Goals - 02/17/20 1702      SLP SHORT TERM GOAL #1   Title Osie will sustain attention for 5 minutes to simple cognitive task with occasional min A over 3 sessions    Baseline 1-2 minutes    Time 5    Period Weeks    Status Partially Met      SLP SHORT TERM GOAL #2   Title Pt will verbalize 2 safety rules for walking at home with occasional min A over 3 sessions    Baseline pt states she can walk by herself    Time 5    Period Weeks    Status Partially Met      SLP SHORT TERM GOAL #3   Title Pt will use simple external aid to follow simple schedule and prepare for appointments with usual min A from family    Baseline does not recall or prepare for appointments    Time 5    Period Weeks    Status Partially Met      SLP SHORT TERM GOAL #4   Title Pt will use visual and written word cues to wash her face with usual min verbal cues from mom over 3 sessions    Baseline Per mom, pt only touches her face 2-3 times when asked to wash her face    Time 5    Period Weeks    Status Achieved            SLP Long Term Goals - 02/17/20 1702      SLP LONG TERM GOAL #1   Title Pt will use external aids/to do list to complete 3 simple chores a day with occasional min A over 3 sessions    Time 11    Period Weeks    Status On-going      SLP LONG TERM GOAL #2   Title Pt will sustain attention for 10 minutes to simple cognitive task with 4 or less re-directions over 3 sessions    Baseline sustains attention for 1-2 minutes    Time 11     Period Weeks    Status On-going      SLP LONG TERM GOAL #3   Title Pt will use visual cues and schedule to eat 2 meals a day and drink 24 oz of liquid with usual min A over 3 sessions  Baseline pt eating less than 1 meal a day and taking only sips (PEG is used)    Time 11    Period Weeks    Status On-going      SLP LONG TERM GOAL #4   Title Pt will use reduced rate to be intellgible in 5 minute conversation with occasional min A    Baseline Rapid rate, rushes of speech, 35% of utterances unintelligible    Time 11    Period Weeks    Status On-going            Plan - 02/19/20 1628    Clinical Impression Statement Nathasha was alert throughout session today. Average attention to task was 5 minutes, with occasional mod cues. Intelligibility improves significantly with slower rate; visual and at times tactile cues are necessary for pt to slow rate. Continue skilled ST to maximize cognition for improved oral intake, safety, and quality of life.    Speech Therapy Frequency 2x / week    Duration 12 weeks   or 25 visits   Treatment/Interventions Language facilitation;Environmental controls;Cueing hierarchy;SLP instruction and feedback;Aspiration precaution training;Compensatory techniques;Cognitive reorganization;Functional tasks;Compensatory strategies;Diet toleration management by SLP;Trials of upgraded texture/liquids;Internal/external aids;Multimodal communcation approach;Patient/family education    Potential to Achieve Goals Fair    Potential Considerations Severity of impairments;Financial resources           Patient will benefit from skilled therapeutic intervention in order to improve the following deficits and impairments:   Dysarthria and anarthria  Cognitive communication deficit  Anoxic brain injury [facility])    Problem List Patient Active Problem List   Diagnosis Date Noted  . Oropharyngeal dysphagia 02/12/2020  . Coronary artery disease involving native coronary artery  of native heart without angina pectoris 12/12/2019  . Ischemic cardiomyopathy 12/12/2019  . Brain injury ([place])   . Prediabetes   . Anoxic brain injury ([place]) 11/05/2019  . Dysphagia 11/05/2019  . Physical deconditioning 11/05/2019  . Acute delirium 11/05/2019  . Respiratory failure ([place])   . Acute metabolic encephalopathy   . Encounter for central line placement   . Ventricular fibrillation ([place]) 09/23/2019  . Acute combined systolic and diastolic heart failure ([place]) 09/23/2019  . Ventricular tachycardia, polymorphic ([place]) 09/23/2019  . Pelvic pain affecting pregnancy 10/15/2015  . Supervision of normal pregnancy in third trimester 09/28/2015  . Poor weight gain of pregnancy 09/28/2015  . Iron deficiency anemia of pregnancy 09/01/2015  . Increased BMI (body mass index) 07/29/2015   Deneise Lever, [place], CCC-SLP Speech-Language Pathologist  Aliene Altes 02/19/2020, 4:30 PM  [place] MAIN [facility] SERVICES [address] [place], [place], [address] Phone: [phone]   Fax:  [phone]   Name: DESHUNDA THACKSTON MRN: [id number] Date of Birth: [birth year]

## 2020-02-20 ENCOUNTER — Ambulatory Visit: Payer: Medicaid Other | Admitting: Family

## 2020-02-24 ENCOUNTER — Encounter: Payer: Medicaid Other | Admitting: Speech Pathology

## 2020-02-24 ENCOUNTER — Ambulatory Visit: Payer: Medicaid Other

## 2020-02-24 ENCOUNTER — Encounter: Payer: Medicaid Other | Admitting: Occupational Therapy

## 2020-02-24 NOTE — Progress Notes (Deleted)
Office Visit    Patient Name: Beverly [name] Date of Encounter: 02/24/2020  Primary Care Provider:  [name] Primary Cardiologist:  Yvonne Kendall, MD Electrophysiologist:  None   Chief Complaint    Beverly [name] is a 35 y.o. 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   Past Medical History    Past Medical History:  Diagnosis Date  . Brain injury (HCC)   . CAD (coronary artery disease)   . HLD (hyperlipidemia)   . Hypertension    last pregnancy  . Ischemic cardiomyopathy   . STEMI (ST elevation myocardial infarction) (HCC) 09/2019   SCAD with aneurysmal dilation of proximal LAD.  . Vaginal Pap smear, abnormal    when she was 35yo  . Ventricular fibrillation (HCC) 09/23/2019   Past Surgical History:  Procedure Laterality Date  . IR GASTROSTOMY TUBE MOD SED  10/07/2019  . LEFT HEART CATH AND CORONARY ANGIOGRAPHY N/A 09/23/2019   Procedure: LEFT HEART CATH AND CORONARY ANGIOGRAPHY;  Surgeon: Yvonne Kendall, MD;  Location: [facility] INVASIVE CV LAB;  Service: Cardiovascular;  Laterality: N/A;    Allergies  No Known Allergies  History of Present Illness    Beverly [name] is a 35 y.o. female with a hx of ventricular fibrillation arrest two weeks postpartum in the setting of aneurysal dilation and spontaneous coronary artery dissection of ostial LAD complicated by anoxic brain injury, ischemic cardiomyopathy, HLD, prediabetes, obesity, HTN, SVT. She was last seen 01/09/2020.  She had a complicated hospitalization lasting 65 days beginning 09/23/19 after presenting the ED with chest pain approximately 2 weeks post partum. She had witnessed cardiac arrest in the ED with ventricular  tachycardia degenerating to ventricular fibrillation. Ultimately, ROSC was achieved after several shocks and rounds of epinephrine. Initial post resucitation EKG with significant anterior ST segment elevated which quickly resolved. Urgent cardiac cath revealed aneurysmal dilation of ostial/prox LAD with apparent area of dissection within the aneurysmal segment. She was transferred to [facility] for targeted care including targeted temperature management. Echo 09/24/19 with LVEF 30-35%, wall motion abnormalities, LV mildly dilated, no significant vailvular abnormalities. Prolonged course on ventilator and underwent tracheostomy and gastrostomy tube placement. Tracheostomy was covered by time of discharge on 11/28/19.  She presented for follow up 12/11/19 with her parents present and assisting with history. There were continued difficulties with memory, speech, and agnitation. She was receiving supplemental nutrition through G-tube. She was recommended for repeat echocardiogram for reassessment of LVEF. Echo 12/30/19 with LVEF improved to 50-55%, no wall motion abnormalities, indeterminate diastolic parameters, and trivial MR.   She was last seen 01/09/2020. Her slepe , agitation, speech, and swalling had all improved though still with notable memory/cognitive deficits. At that time her [name] was transitioned to Losartan due to cost and now low normal LVEF. Her digoxin was discontinued due to low normal LVEF and significant side effect profile.   She presents today for follow up.  ***  EKGs/Labs/Other Studies Reviewed:   The following studies were reviewed today:  Echo 12/30/19  1. Left ventricular ejection fraction, by estimation, is 50 to 55%. The  left ventricle has low normal function. The left ventricle has no regional  wall motion abnormalities. Left ventricular diastolic parameters are  indeterminate.   2.  The mitral valve is grossly normal. Trivial mitral valve  regurgitation.   EKG:  EKG  is ordered today.  The ekg ordered today demonstrates NSR with low voltage and stable lateral TWI. Previous TWI I anterior leads has improved.  Recent Labs: 10/15/2019: B Natriuretic Peptide 168.9 11/12/2019: ALT 35 11/23/2019: Hemoglobin 11.0; Magnesium 2.0; Platelets 616; TSH 0.694 12/11/2019: BUN 7; Creatinine, Ser 0.53; Potassium 3.9; Sodium 140  Recent Lipid Panel    Component Value Date/Time   CHOL 220 (H) 09/26/2019 0841   TRIG 115 10/04/2019 0332   HDL 48 09/26/2019 0841   CHOLHDL 4.6 09/26/2019 0841   VLDL 50 (H) 09/26/2019 0841   LDLCALC 122 (H) 09/26/2019 0841    Home Medications   No outpatient medications have been marked as taking for the 03/02/20 encounter (Appointment) with Alver Sorrow, NP.   Current Facility-Administered Medications for the 03/02/20 encounter (Appointment) with Alver Sorrow, NP  Medication  . iron polysaccharides (NIFEREX) capsule 150 mg     Review of Systems  All other systems reviewed and are otherwise negative except as noted above.  Physical Exam    VS:  There were no vitals taken for this visit. , BMI There is no height or weight on file to calculate BMI.  Wt Readings from Last 3 Encounters:  02/12/20 171 lb (77.6 kg)  01/09/20 180 lb (81.6 kg)  01/06/20 181 lb (82.1 kg)    *** GEN: Well nourished, well developed, in no acute distress. HEENT: normal. Neck: Supple, no JVD, carotid bruits, or masses. Cardiac: RRR, no murmurs, rubs, or gallops. No clubbing, cyanosis, edema.  Radials/DP/PT 2+ and equal bilaterally.  Respiratory:  Respirations regular and unlabored, clear to auscultation bilaterally. GI: Soft, nontender, nondistended. MS: No deformity or atrophy. Skin: Warm and dry, no rash. Neuro:  Strength and sensation are intact. Psych: Normal affect.  Assessment & Plan    1. CAD s/p STEMI -  Cardiac cath at previous VF arrest in August with aneurysmal changes to prox LAD and spontaneous dissection in ostial portion LAD.  Revascularization not pursued at this time. Angiograms reviewed by Dr. Okey Dupre with no reasonable targets for percutaneous intervention. Continue GDMT including aspirin, statin, beta blocker.   Future consideration includes screening for fibromuscular dysplasia per Dr. Okey Dupre due to association with SCAD but recommended continued recovery to minimize stress to Miss [place] and improve compliance with examinations. ***  2. Chronic HFrEF due to ischemic cardiomyopathy  With normalization of LVEF - Reduced EF 30-35% following STEMI. Repeat echo 12/30/19 with LVEF 50-55%, no wall motion abnormalities. Entresto previously discintued due to normalization of LVEF and cost burden.Digoxin discontinued due to low normal LVEF and significant side effect provider.  Present GDMT includes Losartan, Coreg, Spironolactone. Refills provided. Could consider reduced dose of Spironolactone or discontinuation in the future.***  3. HLD, LDL goal <70 - Started on Atorvastatin 80mg  daily during hospitalization. Prior to initiation, 09/26/19 LDL 122. If LDL not at goal of <70, recommend adding Zetia 10mg  daily. ***  4. Ventricular fibrillation -*** In setting of chest pain with immediate ROSC EKG showing anterior ST elevation. Likely precipitated by acute ischemia in setting of ostial LAD CAD. Cath 12/30/19 on date of event with aneurysmal dilation of ostial and prox LAD with possible ulceration or dissection with TIMI-3 flow throughout LAD. EF now low normal 50-55% reducing risk of recurrent VF. No indication for referral to EP for ICD at this time. Continue Coreg 6.25mg  twice daily. Refill provided.  5. Anoxic brain injury - Though VF in setting of ED with prompt resuscitation, still with significant neurological deficits. She is following with neurology, physical medicine and rehab, as well as PT/OT.   Disposition: Follow up*** in 6 week(s) with Dr. Okey Dupre or APP.    Signed, Alver Sorrow, NP 02/24/2020, 1:22 PM [facility]

## 2020-02-26 ENCOUNTER — Encounter: Payer: Medicaid Other | Admitting: Occupational Therapy

## 2020-02-26 ENCOUNTER — Telehealth: Payer: Self-pay

## 2020-02-26 ENCOUNTER — Ambulatory Visit: Payer: Medicaid Other

## 2020-02-26 ENCOUNTER — Ambulatory Visit: Payer: Medicaid Other | Admitting: Speech Pathology

## 2020-02-26 DIAGNOSIS — R41841 Cognitive communication deficit: Secondary | ICD-10-CM

## 2020-02-26 DIAGNOSIS — G931 Anoxic brain damage, not elsewhere classified: Secondary | ICD-10-CM

## 2020-02-26 DIAGNOSIS — R2689 Other abnormalities of gait and mobility: Secondary | ICD-10-CM

## 2020-02-26 DIAGNOSIS — M6281 Muscle weakness (generalized): Secondary | ICD-10-CM

## 2020-02-26 DIAGNOSIS — R2681 Unsteadiness on feet: Secondary | ICD-10-CM

## 2020-02-26 DIAGNOSIS — R262 Difficulty in walking, not elsewhere classified: Secondary | ICD-10-CM

## 2020-02-26 DIAGNOSIS — R471 Dysarthria and anarthria: Secondary | ICD-10-CM

## 2020-02-26 DIAGNOSIS — R278 Other lack of coordination: Secondary | ICD-10-CM

## 2020-02-26 NOTE — Therapy (Signed)
[place] MAIN [facility] SERVICES [address] [place], [place], [address] Phone: [phone]   Fax:  [phone]  Speech Language Pathology Treatment  Patient Details  Name: Beverly [name] MRN: [id number] Date of Birth: 1986-01-16 Referring Provider (SLP): Dr. Wendee Beavers   Encounter Date: 02/26/2020   End of Session - 02/26/20 1648    Visit Number 12    Number of Visits 25    Date for SLP Re-Evaluation 03/03/20    Authorization Type awaiting medicaid    Authorization Time Period 11 ST visits after 1/1    Authorization - Visit Number 4    Authorization - Number of Visits 11    Progress Note Due on Visit 10    SLP Start Time 1300    SLP Stop Time  1358    SLP Time Calculation (min) 58 min    Activity Tolerance Patient tolerated treatment well           Past Medical History:  Diagnosis Date  . Brain injury ([place])   . CAD (coronary artery disease)   . HLD (hyperlipidemia)   . Hypertension    last pregnancy  . Ischemic cardiomyopathy   . STEMI (ST elevation myocardial infarction) ([place]) 09/2019   SCAD with aneurysmal dilation of proximal LAD.  . Vaginal Pap smear, abnormal    when she was 35yo  . Ventricular fibrillation ([place]) 09/23/2019    Past Surgical History:  Procedure Laterality Date  . IR GASTROSTOMY TUBE MOD SED  10/07/2019  . LEFT HEART CATH AND CORONARY ANGIOGRAPHY N/A 09/23/2019   Procedure: LEFT HEART CATH AND CORONARY ANGIOGRAPHY;  Surgeon: Nelva Bush, MD;  Location: [place] CV LAB;  Service: Cardiovascular;  Laterality: N/A;    There were no vitals filed for this visit.   Subjective Assessment - 02/26/20 1634    Subjective "Awesome."    Patient is accompained by: Family member   [name]   Currently in Pain? No/denies                 ADULT SLP TREATMENT - 02/26/20 1634      General Information   Behavior/Cognition Alert;Cooperative;Distractible;Pleasant mood;Requires cueing;Decreased  sustained attention    HPI Pt is a 35 y.o. female with PMH of HTN who was admitted 09/23/19 2 weeks post partum after emergent C section due to rupture of membranes with VF arrest. Pt with enterobacter PNA and encephalopathy, trach placed 8/30 and PEG on 8/31. MRI 8/31: No evidence of anoxic brain injury. Pt changed to #4 cuffless trach on 9/15; capped on 9/21 and decannulated on 9/25. MBS 9/22, POs started - primary oral dysphagia.   Pt currently using PEG as primary nutrition and hydration and for some meds, due to behavior.      Treatment Provided   Treatment provided Cognitive-Linquistic      Pain Assessment   Pain Assessment No/denies pain      Cognitive-Linquistic Treatment   Treatment focused on Dysarthria;Patient/family/caregiver education    Skilled Treatment Continued focus on dysarthria/intelligibility. Patient pleasant and cooperative. Mother reported pt told her yesterday, "I have therapy tomorrow" and "I always have therapy on Thursday," however pt did not recall meeting this therapist before. Her recall of personal details is inconsistent (such as whether or not she has children). Reviewed Beverly [name]'s memory book with her; she read her personal details aloud with usual mod cues for slow rate; no cues necessary for sustained attention today during this task. Benefitted from therapist modeling  rate as well as taps with pen to isolate words. Pt read from her daughter's book with same cuing for rate. With mod cues/redirection, she was able to remain engaged in this task for 10 minutes. Also attempted cuing for loudness vs rate; this improved pt independence somewhat, but only for 1-2 breath groups, before intensity dropped and rushed speech returned, rendering speech unintelligible. For confrontation naming (80% accuracy), patient required usual mod cues for loudness to deliver intelligible production, similar level of cuing necessary for ordering sequence of 3 words. Pt smiled frequently in  session today.      Assessment / Recommendations / Plan   Plan Continue with current plan of care      Progression Toward Goals   Progression toward goals Progressing toward goals            SLP Education - 02/26/20 1647    Education Details Continue with efforts/attempts at feeding Beverly [name] orally; cognition is root cause of her dysphagia    Person(s) Educated Patient;Parent(s)    Methods Explanation    Comprehension Verbalized understanding            SLP Short Term Goals - 02/17/20 1702      SLP SHORT TERM GOAL #1   Title Beverly [name] will sustain attention for 5 minutes to simple cognitive task with occasional min A over 3 sessions    Baseline 1-2 minutes    Time 5    Period Weeks    Status Partially Met      SLP SHORT TERM GOAL #2   Title Pt will verbalize 2 safety rules for walking at home with occasional min A over 3 sessions    Baseline pt states she can walk by herself    Time 5    Period Weeks    Status Partially Met      SLP SHORT TERM GOAL #3   Title Pt will use simple external aid to follow simple schedule and prepare for appointments with usual min A from family    Baseline does not recall or prepare for appointments    Time 5    Period Weeks    Status Partially Met      SLP SHORT TERM GOAL #4   Title Pt will use visual and written word cues to wash her face with usual min verbal cues from mom over 3 sessions    Baseline Per mom, pt only touches her face 2-3 times when asked to wash her face    Time 5    Period Weeks    Status Achieved            SLP Long Term Goals - 02/17/20 1702      SLP LONG TERM GOAL #1   Title Pt will use external aids/to do list to complete 3 simple chores a day with occasional min A over 3 sessions    Time 11    Period Weeks    Status On-going      SLP LONG TERM GOAL #2   Title Pt will sustain attention for 10 minutes to simple cognitive task with 4 or less re-directions over 3 sessions    Baseline sustains attention  for 1-2 minutes    Time 11    Period Weeks    Status On-going      SLP LONG TERM GOAL #3   Title Pt will use visual cues and schedule to eat 2 meals a day and drink 24 oz of liquid with usual  min A over 3 sessions    Baseline pt eating less than 1 meal a day and taking only sips (PEG is used)    Time 11    Period Weeks    Status On-going      SLP LONG TERM GOAL #4   Title Pt will use reduced rate to be intellgible in 5 minute conversation with occasional min A    Baseline Rapid rate, rushes of speech, 35% of utterances unintelligible    Time 11    Period Weeks    Status On-going            Plan - 02/26/20 1649    Clinical Impression Statement Beverly [name] was alert throughout session today. Average attention to task is ~5 minutes, however pt able to sustain attention up to 10 minutes today with mod cues. Intelligibility improves significantly with slower rate; visual and at times tactile cues are necessary for pt to slow rate. Cues for loudness were effective as well. Usual cues remain necessary even in simple structured tasks, regardless of strategy. Continue skilled ST to maximize cognition for improved oral intake, safety, and quality of life.    Speech Therapy Frequency 2x / week    Duration 12 weeks   or 25 visits   Treatment/Interventions Language facilitation;Environmental controls;Cueing hierarchy;SLP instruction and feedback;Aspiration precaution training;Compensatory techniques;Cognitive reorganization;Functional tasks;Compensatory strategies;Diet toleration management by SLP;Trials of upgraded texture/liquids;Internal/external aids;Multimodal communcation approach;Patient/family education    Potential to Achieve Goals Fair    Potential Considerations Severity of impairments;Financial resources           Patient will benefit from skilled therapeutic intervention in order to improve the following deficits and impairments:   Dysarthria and anarthria  Cognitive communication  deficit  Anoxic brain injury [facility])    Problem List Patient Active Problem List   Diagnosis Date Noted  . Oropharyngeal dysphagia 02/12/2020  . Coronary artery disease involving native coronary artery of native heart without angina pectoris 12/12/2019  . Ischemic cardiomyopathy 12/12/2019  . Brain injury ([place])   . Prediabetes   . Anoxic brain injury ([place]) 11/05/2019  . Dysphagia 11/05/2019  . Physical deconditioning 11/05/2019  . Acute delirium 11/05/2019  . Respiratory failure ([place])   . Acute metabolic encephalopathy   . Encounter for central line placement   . Ventricular fibrillation ([place]) 09/23/2019  . Acute combined systolic and diastolic heart failure ([place]) 09/23/2019  . Ventricular tachycardia, polymorphic ([place]) 09/23/2019  . Pelvic pain affecting pregnancy 10/15/2015  . Supervision of normal pregnancy in third trimester 09/28/2015  . Poor weight gain of pregnancy 09/28/2015  . Iron deficiency anemia of pregnancy 09/01/2015  . Increased BMI (body mass index) 07/29/2015   Beverly [name], [place], CCC-SLP Speech-Language Pathologist  Beverly [name] 02/26/2020, 4:51 PM  [place] MAIN [facility] SERVICES [address] [place], [place], [address] Phone: [phone]   Fax:  [phone]   Name: Beverly [name] MRN: [id number] Date of Birth: Jul 06, 1985

## 2020-02-26 NOTE — Therapy (Signed)
[place] MAIN [facility] SERVICES [address] [place], [place], [address] Phone: [phone]   Fax:  [phone]  Physical Therapy Treatment  Patient Details  Name: Beverly [name] MRN: [id number] Date of Birth: Oct 01, 1985 Referring Provider (PT): Mercy Riding, MD   Encounter Date: 02/26/2020   PT End of Session - 02/26/20 1451    Visit Number 16    Number of Visits 17    PT Start Time 0200    PT Stop Time 0244    PT Time Calculation (min) 44 min    Equipment Utilized During Treatment Gait belt    Activity Tolerance Patient tolerated treatment well           Past Medical History:  Diagnosis Date  . Brain injury ([place])   . CAD (coronary artery disease)   . HLD (hyperlipidemia)   . Hypertension    last pregnancy  . Ischemic cardiomyopathy   . STEMI (ST elevation myocardial infarction) ([place]) 09/2019   SCAD with aneurysmal dilation of proximal LAD.  . Vaginal Pap smear, abnormal    when she was 35yo  . Ventricular fibrillation ([place]) 09/23/2019    Past Surgical History:  Procedure Laterality Date  . IR GASTROSTOMY TUBE MOD SED  10/07/2019  . LEFT HEART CATH AND CORONARY ANGIOGRAPHY N/A 09/23/2019   Procedure: LEFT HEART CATH AND CORONARY ANGIOGRAPHY;  Surgeon: Nelva Bush, MD;  Location: [facility];  Service: Cardiovascular;  Laterality: N/A;    There were no vitals filed for this visit.   Subjective Assessment - 02/26/20 1450    Subjective The patient reports she is feeling good.    Patient is accompained by: Family member   mom   Pertinent History HTN    Limitations Sitting;Standing;Walking;House hold activities    Patient Stated Goals walk better    Currently in Pain? No/denies    Pain Score 0-No pain          STANDING  Min CGA 2.5# Hip abduction x20 2.5# Hamstring curls x20 HR/TR x20 verbal, tactile and demonstration needed for exercises.  Discussed with patient the need to slow exercises down.  Sit  to stand 2x5   Ball toss into bin, pt standing on level surface Standing ball dribble- multi-colored ball                         PT Education - 02/26/20 1451    Education Details exercise technique    Person(s) Educated Patient    Methods Explanation;Demonstration;Tactile cues;Verbal cues    Comprehension Verbalized understanding;Returned demonstration;Verbal cues required;Tactile cues required;Need further instruction            PT Short Term Goals - 01/20/20 1542      PT SHORT TERM GOAL #1   Title Patient/caregiver will report independence with HEP with caregiver assistance (All STGs Due: 01/07/2020)    Baseline reports independence; only completing 1-2x/week per mom reports 01/20/20  patient's caregiver assisting patient with HEP.    Time 4    Period Weeks    Status On-going    Target Date 01/07/20      PT SHORT TERM GOAL #2   Title Patient will demo ability to complete transfer from w/c <> mat with CGA with LRAD to demonstrate improved functional mobility    Baseline intermittent CGA- Min A, 01/20/20  continues with intermittent need to transfer from WC<> mat no AD CGA    Time 4  Period Weeks    Status Partially Met    Target Date 01/07/20      PT SHORT TERM GOAL #3   Title Patient will demo ability to ambulate >/= 200 ft  with LRAD or hand hand assist with moderate assistance to demonstrate improved functional mobility in home.    Baseline 115 ft max assist +2 hand held assist., 01/20/20  pt ambulating 175 ft with PT hand had assist in front of patient, no AD.    Time 4    Period Weeks    Status Partially Met             PT Long Term Goals - 01/20/20 1556      PT LONG TERM GOAL #1   Title Patient/caregiver will report indepence with final HEP with caregiver assistance (All LTGS: 02/04/2020)    Baseline no HEP established, 01/20/20  caregiver assisting PT with home program    Time 8    Period Weeks    Status On-going      PT LONG TERM  GOAL #2   Title Patient will demo ability to complete all bed mobility Mod I to demonstrate improved independence    Baseline CGA/Supervision, 01/20/20 supervision, intermitten mod I to perform bed mobility    Time 8    Period Weeks    Status Partially Met      PT LONG TERM GOAL #3   Title Patient will demo ability to complete sit <> stand x 5 reps with supervision to demonstrate improved balance and functional mobility    Baseline CGA - Min A, 01/20/20  intermittent min A to perform sit to stand, sits from a standing position. CGA for safety    Time 8    Period Weeks    Status Partially Met      PT LONG TERM GOAL #4   Title Patient will demo ability to ambulate > 400 ft w/ LRAD versus hand held assist min assist to demonstrate improved household mobility and short community distances.    Baseline TBA, 01/20/20  123f with PT in front of patient, hand held assist, no AD    Time 8    Period Weeks    Status Partially Met                 Plan - 02/26/20 1451    Clinical Impression Statement Patient was distracted this session with her surroundings.  Multiple instruction on exercise technique for each exercise.  Discussed need to work on slowing activities in the clinic as patient demonstrates decreased motor control.  The patient reported fatigue with exercises.  Patient continues to beneift from additional skilled PT services to improve strength and balance for improved quality of life.    Personal Factors and Comorbidities Comorbidity 1;Behavior Pattern;Transportation    Comorbidities HTN    Examination-Activity Limitations Bed Mobility;Dressing;Hygiene/Grooming;Caring for Others;Stairs;Stand;Toileting;Transfers;Sit;Bathing    Examination-Participation Restrictions Occupation;Medication Management;Cleaning    Stability/Clinical Decision Making Evolving/Moderate complexity    Rehab Potential Good    PT Frequency 2x / week    PT Duration 8 weeks    PT Treatment/Interventions  ADLs/Self Care Home Management;Aquatic Therapy;Electrical Stimulation;DME Instruction;Gait training;Stair training;Functional mobility training;Therapeutic activities;Therapeutic exercise;Balance training;Neuromuscular re-education;Wheelchair mobility training;Patient/family education;Orthotic Fit/Training;Manual techniques;Passive range of motion    PT Next Visit Plan FYI- Pt does have feeding tube still. Pt is impulsive. Continue to work on seated and standing balance, BLE strengthening, coordination activities, core strengthening. Gait training with hand held assist of 2 people. Did well  with 1 person in front and 1 in back with decreasing support today.    Consulted and Agree with Plan of Care Patient;Family member/caregiver    Family Member Consulted Mom/Dad           Patient will benefit from skilled therapeutic intervention in order to improve the following deficits and impairments:  Abnormal gait,Decreased balance,Decreased endurance,Decreased mobility,Difficulty walking,Impaired tone,Impaired sensation,Pain,Impaired UE functional use,Decreased strength,Decreased safety awareness,Decreased knowledge of use of DME,Decreased coordination,Decreased activity tolerance,Decreased cognition,Decreased range of motion  Visit Diagnosis: Anoxic brain injury ([place])  Unsteadiness on feet  Muscle weakness (generalized)  Other lack of coordination  Other abnormalities of gait and mobility  Difficulty in walking, not elsewhere classified     Problem List Patient Active Problem List   Diagnosis Date Noted  . Oropharyngeal dysphagia 02/12/2020  . Coronary artery disease involving native coronary artery of native heart without angina pectoris 12/12/2019  . Ischemic cardiomyopathy 12/12/2019  . Brain injury ([place])   . Prediabetes   . Anoxic brain injury ([place]) 11/05/2019  . Dysphagia 11/05/2019  . Physical deconditioning 11/05/2019  . Acute delirium 11/05/2019  . Respiratory failure ([place])    . Acute metabolic encephalopathy   . Encounter for central line placement   . Ventricular fibrillation ([place]) 09/23/2019  . Acute combined systolic and diastolic heart failure ([place]) 09/23/2019  . Ventricular tachycardia, polymorphic ([name]) 09/23/2019  . Pelvic pain affecting pregnancy 10/15/2015  . Supervision of normal pregnancy in third trimester 09/28/2015  . Poor weight gain of pregnancy 09/28/2015  . Iron deficiency anemia of pregnancy 09/01/2015  . Increased BMI (body mass index) 07/29/2015    Hal Morales PT, DPT 02/26/2020, 2:59 PM  [place] MAIN [facility] SERVICES [address] [place], [place], [address] Phone: [phone]   Fax:  [phone]  Name: Beverly [name] MRN: [id number] Date of Birth: 1985-07-18

## 2020-02-26 NOTE — Telephone Encounter (Signed)
Error

## 2020-02-27 ENCOUNTER — Telehealth: Payer: Self-pay | Admitting: Internal Medicine

## 2020-02-27 NOTE — Telephone Encounter (Signed)
Medical Record request received from disability determination services. Faxed to [facility] medical records and scanned into chart

## 2020-03-02 ENCOUNTER — Ambulatory Visit: Payer: Medicaid Other

## 2020-03-02 ENCOUNTER — Ambulatory Visit: Payer: Medicaid Other | Admitting: Family

## 2020-03-02 ENCOUNTER — Ambulatory Visit: Payer: Medicaid Other | Admitting: Speech Pathology

## 2020-03-02 ENCOUNTER — Ambulatory Visit: Payer: Medicaid Other | Admitting: Occupational Therapy

## 2020-03-02 ENCOUNTER — Other Ambulatory Visit: Payer: Self-pay

## 2020-03-02 ENCOUNTER — Encounter: Payer: Self-pay | Admitting: Occupational Therapy

## 2020-03-02 DIAGNOSIS — R41841 Cognitive communication deficit: Secondary | ICD-10-CM | POA: Diagnosis not present

## 2020-03-02 DIAGNOSIS — M6281 Muscle weakness (generalized): Secondary | ICD-10-CM

## 2020-03-02 DIAGNOSIS — G931 Anoxic brain damage, not elsewhere classified: Secondary | ICD-10-CM

## 2020-03-02 DIAGNOSIS — R262 Difficulty in walking, not elsewhere classified: Secondary | ICD-10-CM

## 2020-03-02 DIAGNOSIS — R278 Other lack of coordination: Secondary | ICD-10-CM

## 2020-03-02 DIAGNOSIS — R1312 Dysphagia, oropharyngeal phase: Secondary | ICD-10-CM

## 2020-03-02 DIAGNOSIS — R2689 Other abnormalities of gait and mobility: Secondary | ICD-10-CM

## 2020-03-02 DIAGNOSIS — R2681 Unsteadiness on feet: Secondary | ICD-10-CM

## 2020-03-02 DIAGNOSIS — R471 Dysarthria and anarthria: Secondary | ICD-10-CM

## 2020-03-02 NOTE — Therapy (Signed)
[place] MAIN [facility] SERVICES [address] [place], [place], [address] Phone: [phone]   Fax:  [phone]  Speech Language Pathology Treatment  Patient Details  Name: Beverly [name] MRN: [id number] Date of Birth: 10/05/1985 Referring Provider (SLP): Dr. Wendee Beavers   Encounter Date: 03/02/2020   End of Session - 03/02/20 1509    Visit Number 13    Number of Visits 25    Date for SLP Re-Evaluation 03/03/20    Authorization Time Period 11 ST visits after 1/1    Authorization - Visit Number 5    Authorization - Number of Visits 11    Progress Note Due on Visit 10    SLP Start Time 1300    SLP Stop Time  1408    SLP Time Calculation (min) 68 min    Activity Tolerance Patient tolerated treatment well           Past Medical History:  Diagnosis Date  . Brain injury ([place])   . CAD (coronary artery disease)   . HLD (hyperlipidemia)   . Hypertension    last pregnancy  . Ischemic cardiomyopathy   . STEMI (ST elevation myocardial infarction) ([place]) 09/2019   SCAD with aneurysmal dilation of proximal LAD.  . Vaginal Pap smear, abnormal    when she was 35yo  . Ventricular fibrillation ([employer]) 09/23/2019    Past Surgical History:  Procedure Laterality Date  . IR GASTROSTOMY TUBE MOD SED  10/07/2019  . LEFT HEART CATH AND CORONARY ANGIOGRAPHY N/A 09/23/2019   Procedure: LEFT HEART CATH AND CORONARY ANGIOGRAPHY;  Surgeon: Nelva Bush, MD;  Location: [facility];  Service: Cardiovascular;  Laterality: N/A;    There were no vitals filed for this visit.   Subjective Assessment - 03/02/20 1431    Subjective Mother talked with nutritionist yesterday    Currently in Pain? No/denies                 ADULT SLP TREATMENT - 03/02/20 1431      General Information   Behavior/Cognition Alert;Cooperative;Distractible;Pleasant mood;Requires cueing;Decreased sustained attention    HPI Pt is a 35 y.o. female with PMH of HTN who  was admitted 09/23/19 2 weeks post partum after emergent C section due to rupture of membranes with VF arrest. Pt with enterobacter PNA and encephalopathy, trach placed 8/30 and PEG on 8/31. MRI 8/31: No evidence of anoxic brain injury. Pt changed to #4 cuffless trach on 9/15; capped on 9/21 and decannulated on 9/25. MBS 9/22, POs started - primary oral dysphagia.   Pt currently using PEG as primary nutrition and hydration and for some meds, due to behavior.      Treatment Provided   Treatment provided Cognitive-Linquistic      Pain Assessment   Pain Assessment No/denies pain      Cognitive-Linquistic Treatment   Treatment focused on Patient/family/caregiver education;Cognition    Skilled Treatment Mom reporting pt does not seem to "have interest in anything." They worked on reading some at home. Continue to review her memory book. On some days she is able to tell her mother that she had a heart attack and a brain injury. Targeted sustained attention in simple listening tasks; pt less attentive today than in previous session. Recalled >50% of details from a paragraph; 90% from simple sentences but was quickly bored by this task. Progressing to more complex sentences, recalled ~60% of details. Used simple conversation re: pt topics of interest (her cousin Darnelle Maffucci,  previous jobs, and food) and pt was much more attentive in this task; able to hold attention for periods of ~5 minutes. Mom expressed frustration that Beverly [name] does not have desire to eat. She is establishing care with a nutritionist; SLP encouraged mother to discuss whether timing/amount of feedings might be adjusted to help with appetite. Mother had questions for SLP re: expectations of progress, recovery. SLP explained that with severity of pt's brain injury, and given severity of cognitive deficits, expect pt to continue to rely on assistance, though she can continue to improve and increase her level of participation/independence.       Assessment / Recommendations / Plan   Plan Continue with current plan of care      Progression Toward Goals   Progression toward goals Progressing toward goals            SLP Education - 03/02/20 1509    Education Details discuss with RD whether timing/amount of tube feedings can be adjusted to promote interest in POs    Person(s) Educated Patient;Parent(s)    Methods Explanation    Comprehension Verbalized understanding            SLP Short Term Goals - 02/17/20 1702      SLP SHORT TERM GOAL #1   Title Beverly [name] will sustain attention for 5 minutes to simple cognitive task with occasional min A over 3 sessions    Baseline 1-2 minutes    Time 5    Period Weeks    Status Partially Met      SLP SHORT TERM GOAL #2   Title Pt will verbalize 2 safety rules for walking at home with occasional min A over 3 sessions    Baseline pt states she can walk by herself    Time 5    Period Weeks    Status Partially Met      SLP SHORT TERM GOAL #3   Title Pt will use simple external aid to follow simple schedule and prepare for appointments with usual min A from family    Baseline does not recall or prepare for appointments    Time 5    Period Weeks    Status Partially Met      SLP SHORT TERM GOAL #4   Title Pt will use visual and written word cues to wash her face with usual min verbal cues from mom over 3 sessions    Baseline Per mom, pt only touches her face 2-3 times when asked to wash her face    Time 5    Period Weeks    Status Achieved            SLP Long Term Goals - 02/17/20 1702      SLP LONG TERM GOAL #1   Title Pt will use external aids/to do list to complete 3 simple chores a day with occasional min A over 3 sessions    Time 11    Period Weeks    Status On-going      SLP LONG TERM GOAL #2   Title Pt will sustain attention for 10 minutes to simple cognitive task with 4 or less re-directions over 3 sessions    Baseline sustains attention for 1-2 minutes    Time  11    Period Weeks    Status On-going      SLP LONG TERM GOAL #3   Title Pt will use visual cues and schedule to eat 2 meals a day and drink  24 oz of liquid with usual min A over 3 sessions    Baseline pt eating less than 1 meal a day and taking only sips (PEG is used)    Time 11    Period Weeks    Status On-going      SLP LONG TERM GOAL #4   Title Pt will use reduced rate to be intellgible in 5 minute conversation with occasional min A    Baseline Rapid rate, rushes of speech, 35% of utterances unintelligible    Time 11    Period Weeks    Status On-going            Plan - 03/02/20 1510    Clinical Impression Statement Required more frequent cues for sustained attention today than in previous session. Symptoms continue to wax and wane; some days are better than others per mother. Mother would like for Beverly [name] to show more interest in things, be able to eat more by mouth. [name] cognition vs motor deficits remains greatest barrier to consuming POs; encouraged mother to discuss with RD whether adjustments and feeding routine can be made to promote appetite. Beverly [name] was most engaged in session when discussing family members and previous jobs. Continue to educate on functional activities to target Beverly [name]'s attention/engagement. Continue skilled ST to maximize cognition for improved oral intake, safety, and quality of life.    Speech Therapy Frequency 2x / week    Duration 12 weeks   or 25 visits   Treatment/Interventions Language facilitation;Environmental controls;Cueing hierarchy;SLP instruction and feedback;Aspiration precaution training;Compensatory techniques;Cognitive reorganization;Functional tasks;Compensatory strategies;Diet toleration management by SLP;Trials of upgraded texture/liquids;Internal/external aids;Multimodal communcation approach;Patient/family education    Potential to Achieve Goals Fair    Potential Considerations Severity of impairments;Financial resources            Patient will benefit from skilled therapeutic intervention in order to improve the following deficits and impairments:   Cognitive communication deficit  Dysarthria and anarthria  Dysphagia, oropharyngeal phase  Anoxic brain injury Beverly [name])    Problem List Patient Active Problem List   Diagnosis Date Noted  . Oropharyngeal dysphagia 02/12/2020  . Coronary artery disease involving native coronary artery of native heart without angina pectoris 12/12/2019  . Ischemic cardiomyopathy 12/12/2019  . Brain injury ([place])   . Prediabetes   . Anoxic brain injury ([place]) 11/05/2019  . Dysphagia 11/05/2019  . Physical deconditioning 11/05/2019  . Acute delirium 11/05/2019  . Respiratory failure ([place])   . Acute metabolic encephalopathy   . Encounter for central line placement   . Ventricular fibrillation ([place]) 09/23/2019  . Acute combined systolic and diastolic heart failure ([place]) 09/23/2019  . Ventricular tachycardia, polymorphic ([place]) 09/23/2019  . Pelvic pain affecting pregnancy 10/15/2015  . Supervision of normal pregnancy in third trimester 09/28/2015  . Poor weight gain of pregnancy 09/28/2015  . Iron deficiency anemia of pregnancy 09/01/2015  . Increased BMI (body mass index) 07/29/2015   Deneise Lever, [place], CCC-SLP Speech-Language Pathologist  Aliene Altes 03/02/2020, 3:17 PM  [place] MAIN [facility] SERVICES [address] [place], [place], [address] Phone: [phone]   Fax:  [phone]   Name: Beverly [name] MRN: [id number] Date of Birth: [birth year]

## 2020-03-02 NOTE — Therapy (Signed)
[place] [facility] MAIN [facility] SERVICES [address] [place], [place], [address] Phone: [phone]   Fax:  [phone]  Occupational Therapy Treatment  Patient Details  Name: Beverly [name] MRN: [id number] Date of Birth: 06-05-1985 Referring Provider (OT): Candelaria Stagers MD   Encounter Date: 03/02/2020   OT End of Session - 03/02/20 1732    Visit Number 11    Number of Visits 24    Date for OT Re-Evaluation 03/16/20    OT Start Time 1407    OT Stop Time 1445    OT Time Calculation (min) 38 min    Activity Tolerance Patient tolerated treatment well    Behavior During Therapy Impulsive           Past Medical History:  Diagnosis Date  . Brain injury (HCC)   . CAD (coronary artery disease)   . HLD (hyperlipidemia)   . Hypertension    last pregnancy  . Ischemic cardiomyopathy   . STEMI (ST elevation myocardial infarction) (HCC) 09/2019   SCAD with aneurysmal dilation of proximal LAD.  . Vaginal Pap smear, abnormal    when she was 35yo  . Ventricular fibrillation (HCC) 09/23/2019    Past Surgical History:  Procedure Laterality Date  . IR GASTROSTOMY TUBE MOD SED  10/07/2019  . LEFT HEART CATH AND CORONARY ANGIOGRAPHY N/A 09/23/2019   Procedure: LEFT HEART CATH AND CORONARY ANGIOGRAPHY;  Surgeon: Yvonne Kendall, MD;  Location: [facility] INVASIVE CV LAB;  Service: Cardiovascular;  Laterality: N/A;    There were no vitals filed for this visit.   Subjective Assessment - 03/02/20 1732    Subjective  Pt. was present with her mother    Patient is accompanied by: Family member    Pertinent History On 09/23/19 patient presented to ED 2 weeks postpartum (had emergent C-Section) with chest pain. In ED sustained VFib Cardiac Arrest requiring intubation. EKG showed ST elevation, Cath Lab showed aneurysmal dissection of the LAD was identified. Course complicated by encephalopathy, anoxic brain injury, enterobacterial PNAPMH: HTN    Currently in Pain? No/denies           Self-care:   Pt. worked on Environmental consultant the letters of her name with her dominant left hand. Pt. required the use of a 1" red built-up foam handle on a pen. Pt. required less cues, and assist to reposition the the pen in her hand initiating more with her left hand, and using her right hand to assist as needed. Pt. Worked on tracing straight line segments without lifting the pen from the paper.  Pt. presents with impaired bilateral UE motor control, and coordination skills. Pt. requires assist for stabilization distally during tabletop coordination tasks. Pt. Required assist to reposition a large handled adaptive pen in her left hand during writing. Pt. Required cues to formulate the individual letters S, H, A, T, O, Y, A  Slowly. Legibility was 25%. forming the letters large, with impaired spacing and positive deviation below the line. Pt.  Pt. was unable to formulate H, A, and had difficulty completing O's.                           OT Education - 03/02/20 1732    Education Details FMC, bilateral hand coordination    Person(s) Educated Patient;Parent(s)    Methods Explanation    Comprehension Need further instruction            OT Short Term  Goals - 02/17/20 1503      OT SHORT TERM GOAL #1   Title Pt/ caregiver will be I with inital HEP.    Baseline Independent    Status Achieved      OT SHORT TERM GOAL #2   Period Weeks      OT SHORT TERM GOAL #3   Baseline 1 min with re-direction      OT SHORT TERM GOAL #4   Title Pt will wash her face and brush her teeth with no more than min v.c for thoroughness    Baseline Independent with verbal cues, and set-up.    Time 6    Period Weeks    Status On-going      OT SHORT TERM GOAL #6   Title Pt will demonstrate ability to sequence a simple functional or ADL task with no more than min v.c    Baseline Pt requires grossly min-modA with sequencing simple activities    Period Weeks     Status On-going             OT Long Term Goals - 02/17/20 1510      OT LONG TERM GOAL #1   Title Pt will perform LB bathing and dressing with no more than min A.    Baseline ModA    Time 12    Period Weeks    Status On-going    Target Date 03/16/20      OT LONG TERM GOAL #2   Title Pt will demonstrate improved bilateral UE functional use by increasing box/ blocks score by 6 blocks from inital measurement.    Baseline RUE 10 blocks, LUE 12 blocks    Time 12    Status Deferred      OT LONG TERM GOAL #3   Title Pt will perform simple cold snack prep with min A.    Baseline dependent    Time 12    Period Weeks    Status On-going    Target Date 03/16/20      OT LONG TERM GOAL #4   Title Pt will attend to an ADL/ simple functional familiar task for 10 mins.    Baseline attends for grossly 1 min with re-direct.    Time 12    Period Weeks    Status On-going      OT LONG TERM GOAL #5   Title Pt perform a simple home management task with no more than min A, demonstrating good safety awareness.    Baseline dependent    Time 12    Period Weeks    Status On-going    Target Date 03/16/20      OT LONG TERM GOAL #6   Title Pt will write her name with 75% legibility    Baseline Pt. is able to formulate several letters of her name.    Time 13    Period Weeks    Status On-going    Target Date 03/16/20                 Plan - 03/02/20 1732    Clinical Impression Statement Pt. presents with impaired bilateral UE motor control, and coordination skills. Pt. requires assist for stabilization distally during tabletop coordination tasks. Pt. Required assist to reposition a large handled adaptive pen in her left hand during writing. Pt. Required cues to formulate the individual letters S, H, A, T, O, Y, A  Slowly. Legibility was 25%. forming the letters large, with impaired  spacing and positive deviation below the line. Pt.  Pt. was unable to formulate H, A, and had difficulty  completing O's.    OT Occupational Profile and History Detailed Assessment- Review of Records and additional review of physical, cognitive, psychosocial history related to current functional performance    Occupational performance deficits (Please refer to evaluation for details): ADL's;IADL's;Work;Leisure;Social Participation    Body Structure / Function / Physical Skills ADL;Endurance;UE functional use;Balance;Pain;Flexibility;FMC;ROM;GMC;Coordination;Decreased knowledge of precautions;Decreased knowledge of use of DME;IADL;Dexterity;Strength;Mobility;Tone;Vision;Sensation;Gait    Cognitive Skills Attention;Energy/Drive;Memory;Orientation;Problem Solve;Safety Awareness;Thought;Understand    Rehab Potential Good    Clinical Decision Making Several treatment options, min-mod task modification necessary    Comorbidities Affecting Occupational Performance: May have comorbidities impacting occupational performance    Modification or Assistance to Complete Evaluation  Min-Moderate modification of tasks or assist with assess necessary to complete eval    OT Frequency 2x / week    OT Duration 12 weeks    OT Treatment/Interventions Self-care/ADL training;Ultrasound;Visual/perceptual remediation/compensation;Patient/family education;Scar mobilization;Aquatic Therapy;Paraffin;Passive range of motion;Balance training;Dry needling;Stair Training;Fluidtherapy;Cryotherapy;Splinting;Moist Heat;Manual Therapy;Therapeutic exercise;Therapeutic activities;Functional Mobility Training;Neuromuscular education;Cognitive remediation/compensation    Consulted and Agree with Plan of Care Patient;Family member/caregiver           Patient will benefit from skilled therapeutic intervention in order to improve the following deficits and impairments:   Body Structure / Function / Physical Skills: ADL,Endurance,UE functional use,Balance,Pain,Flexibility,FMC,ROM,GMC,Coordination,Decreased knowledge of precautions,Decreased  knowledge of use of DME,IADL,Dexterity,Strength,Mobility,Tone,Vision,Sensation,Gait Cognitive Skills: Attention,Energy/Drive,Memory,Orientation,Problem Solve,Safety Awareness,Thought,Understand     Visit Diagnosis: Muscle weakness (generalized)  Other lack of coordination    Problem List Patient Active Problem List   Diagnosis Date Noted  . Oropharyngeal dysphagia 02/12/2020  . Coronary artery disease involving native coronary artery of native heart without angina pectoris 12/12/2019  . Ischemic cardiomyopathy 12/12/2019  . Brain injury (HCC)   . Prediabetes   . Anoxic brain injury (HCC) 11/05/2019  . Dysphagia 11/05/2019  . Physical deconditioning 11/05/2019  . Acute delirium 11/05/2019  . Respiratory failure (HCC)   . Acute metabolic encephalopathy   . Encounter for central line placement   . Ventricular fibrillation (HCC) 09/23/2019  . Acute combined systolic and diastolic heart failure (HCC) 09/23/2019  . Ventricular tachycardia, polymorphic (HCC) 09/23/2019  . Pelvic pain affecting pregnancy 10/15/2015  . Supervision of normal pregnancy in third trimester 09/28/2015  . Poor weight gain of pregnancy 09/28/2015  . Iron deficiency anemia of pregnancy 09/01/2015  . Increased BMI (body mass index) 07/29/2015    Olegario Messier, MS, OTR/L 03/02/2020, 5:34 PM  [place] [facility] [facility] MAIN [facility] SERVICES [address] [place], [place], [address] Phone: [phone]   Fax:  [phone]  Name: JUDYANN CASASOLA MRN: [id number] Date of Birth: [birth year]

## 2020-03-02 NOTE — Therapy (Signed)
[place] MAIN [facility] SERVICES [name] [place], [place], [address] Phone: [phone]   Fax:  [phone]  Physical Therapy Treatment/Re-Certification  Patient Details  Name: Beverly [name] MRN: [id number] Date of Birth: 04-12-1985 Referring Provider (PT): Mercy Riding, MD   Encounter Date: 03/02/2020   PT End of Session - 03/02/20 1450    Visit Number 17    Number of Visits 25    PT Start Time 0250    PT Stop Time 0330    PT Time Calculation (min) 40 min    Equipment Utilized During Treatment Gait belt    Activity Tolerance Patient tolerated treatment well           Past Medical History:  Diagnosis Date  . Brain injury ([employer])   . CAD (coronary artery disease)   . HLD (hyperlipidemia)   . Hypertension    last pregnancy  . Ischemic cardiomyopathy   . STEMI (ST elevation myocardial infarction) ([place]) 09/2019   SCAD with aneurysmal dilation of proximal LAD.  . Vaginal Pap smear, abnormal    when she was 35yo  . Ventricular fibrillation ([place]) 09/23/2019    Past Surgical History:  Procedure Laterality Date  . IR GASTROSTOMY TUBE MOD SED  10/07/2019  . LEFT HEART CATH AND CORONARY ANGIOGRAPHY N/A 09/23/2019   Procedure: LEFT HEART CATH AND CORONARY ANGIOGRAPHY;  Surgeon: Nelva Bush, MD;  Location: [place] CV LAB;  Service: Cardiovascular;  Laterality: N/A;    There were no vitals filed for this visit.   Subjective Assessment - 03/02/20 1550    Subjective The patient reports she needs assistance sometimes but is doing well.    Patient is accompained by: Family member   mom   Pertinent History HTN    Limitations Sitting;Standing;Walking;House hold activities    Patient Stated Goals walk better    Currently in Pain? No/denies    Pain Score 0-No pain              Goals updated   Sit to stand x5 pt hesitant to stand alone preferred hand held assist from PT though patient has been able to demonstrate  independence with this in previous visits  Ball kicks with light UE support multiple repetitions.  Cueing to do with no UE support patient unable to do this despite multiple verbal and tactile cueing  Ball toss into bin, pt standing on level surface  Seated  2.5# LAQ x20 each with verbal and tactile cueing 2.5# Marching x20 each with verbal and tactile cueing   verbal, tactile and demonstration needed for exercises.  Discussed with patient the need to slow exercises down.                    PT Education - 03/02/20 1552    Education Details progress towards goals, exercise technique    Person(s) Educated Patient    Methods Explanation;Demonstration;Tactile cues;Verbal cues    Comprehension Verbalized understanding;Returned demonstration;Verbal cues required;Tactile cues required;Need further instruction            PT Short Term Goals - 03/02/20 1451      PT SHORT TERM GOAL #1   Title Patient/caregiver will report independence with HEP with caregiver assistance (All STGs Due: 01/07/2020)    Baseline reports independence; only completing 1-2x/week per mom reports 01/20/20  patient's caregiver assisting patient with HEP, 03/02/20  pt doing exercises at home regularly    Time 4    Period Weeks  Status Partially Met    Target Date 03/30/20      PT SHORT TERM GOAL #2   Title Patient will demo ability to complete transfer from w/c <> mat with CGA with LRAD to demonstrate improved functional mobility    Baseline intermittent CGA- Min A, 01/20/20  continues with intermittent need to transfer from WC<> mat no AD CGA, min assist or close supervision at home with transfers without use of AD.    Time 4    Period Weeks    Status Partially Met    Target Date 03/30/20      PT SHORT TERM GOAL #3   Title Patient will demo ability to ambulate >/= 200 ft  with LRAD or hand hand assist with moderate assistance to demonstrate improved functional mobility in home.    Baseline 115  ft max assist +2 hand held assist., 01/20/20  pt ambulating 175 ft with PT hand had assist in front of patient, no AD.  03/02/20  200 ft with PT hand held assist, min A    Time 4    Period Weeks    Status Achieved    Target Date 03/30/20             PT Long Term Goals - 03/02/20 1453      PT LONG TERM GOAL #1   Title Patient/caregiver will report indepence with final HEP with caregiver assistance (All LTGS: 02/04/2020)    Baseline no HEP established, 01/20/20  caregiver assisting PT with home program    Time 8    Period Weeks    Status On-going      PT LONG TERM GOAL #2   Title Patient will demo ability to complete all bed mobility Mod I to demonstrate improved independence    Baseline CGA/Supervision, 01/20/20 supervision, intermitten mod I to perform bed mobility, 03/02/20 supervision with bed mobility by caregiver min assist on occasion    Time 8    Period Weeks    Status Partially Met    Target Date 03/30/20      PT LONG TERM GOAL #3   Title Patient will demo ability to complete sit <> stand x 5 reps with supervision to demonstrate improved balance and functional mobility    Baseline CGA - Min A, 01/20/20  intermittent min A to perform sit to stand, sits from a standing position. CGA for safety, 03/02/20  pt hesitant to stand alone preferred hand held assist from PT though patient has been able to demonstrate independence with this in previous visits    Time 8    Period Weeks    Status Partially Met    Target Date 03/30/20      PT LONG TERM GOAL #4   Title Patient will demo ability to ambulate > 400 ft w/ LRAD versus hand held assist min assist to demonstrate improved household mobility and short community distances.    Baseline TBA, 01/20/20  129f with PT in front of patient, hand held assist, no AD, 03/02/20  pt ambulating with hand held assist with PT in front, patient's gait pattern normalizing, less ataxic 200 ft prior to patient request to sit.    Time 8    Period Weeks     Status Partially Met    Target Date 03/30/20                 Plan - 03/02/20 1548    Clinical Impression Statement The patient is making progress overall.  The  patient has demostrated less impulsivity consistently and presents with improved coordination and movement patterns.  The patient continues with decreased motor control which will continue to be address with skilled PT services.  Patient's condition has the potential to improve in response to therapy. Maximum improvement is yet to be obtained. The anticipated improvement is attainable and reasonable in a generally predictable time.    Personal Factors and Comorbidities Comorbidity 1;Behavior Pattern;Transportation    Comorbidities HTN    Examination-Activity Limitations Bed Mobility;Dressing;Hygiene/Grooming;Caring for Others;Stairs;Stand;Toileting;Transfers;Sit;Bathing    Examination-Participation Restrictions Occupation;Medication Management;Cleaning    Stability/Clinical Decision Making Evolving/Moderate complexity    Rehab Potential Good    PT Frequency 2x / week    PT Duration 8 weeks    PT Treatment/Interventions ADLs/Self Care Home Management;Aquatic Therapy;Electrical Stimulation;DME Instruction;Gait training;Stair training;Functional mobility training;Therapeutic activities;Therapeutic exercise;Balance training;Neuromuscular re-education;Wheelchair mobility training;Patient/family education;Orthotic Fit/Training;Manual techniques;Passive range of motion    PT Next Visit Plan FYI- Pt does have feeding tube still. Pt is impulsive. Continue to work on seated and standing balance, BLE strengthening, coordination activities, core strengthening. Gait training with hand held assist of 2 people. Did well with 1 person in front and 1 in back with decreasing support today.    Consulted and Agree with Plan of Care Patient;Family member/caregiver    Family Member Consulted Mom/Dad           Patient will benefit from skilled  therapeutic intervention in order to improve the following deficits and impairments:  Abnormal gait,Decreased balance,Decreased endurance,Decreased mobility,Difficulty walking,Impaired tone,Impaired sensation,Pain,Impaired UE functional use,Decreased strength,Decreased safety awareness,Decreased knowledge of use of DME,Decreased coordination,Decreased activity tolerance,Decreased cognition,Decreased range of motion  Visit Diagnosis: Anoxic brain injury ([facility])  Unsteadiness on feet  Muscle weakness (generalized)  Other lack of coordination  Other abnormalities of gait and mobility  Difficulty in walking, not elsewhere classified     Problem List Patient Active Problem List   Diagnosis Date Noted  . Oropharyngeal dysphagia 02/12/2020  . Coronary artery disease involving native coronary artery of native heart without angina pectoris 12/12/2019  . Ischemic cardiomyopathy 12/12/2019  . Brain injury ([place])   . Prediabetes   . Anoxic brain injury ([place]) 11/05/2019  . Dysphagia 11/05/2019  . Physical deconditioning 11/05/2019  . Acute delirium 11/05/2019  . Respiratory failure ([place])   . Acute metabolic encephalopathy   . Encounter for central line placement   . Ventricular fibrillation ([place]) 09/23/2019  . Acute combined systolic and diastolic heart failure ([place]) 09/23/2019  . Ventricular tachycardia, polymorphic ([place]) 09/23/2019  . Pelvic pain affecting pregnancy 10/15/2015  . Supervision of normal pregnancy in third trimester 09/28/2015  . Poor weight gain of pregnancy 09/28/2015  . Iron deficiency anemia of pregnancy 09/01/2015  . Increased BMI (body mass index) 07/29/2015    Hal Morales PT, DPT 03/02/2020, 3:52 PM  [facility] MAIN [facility] SERVICES [address] [place], [place], [address] Phone: [phone]   Fax:  [phone]  Name: TIFFANEE MCNEE MRN: [id number] Date of Birth: 1985-06-02

## 2020-03-02 NOTE — Patient Instructions (Signed)
Swallowing:  Ask the dietitian if it might help to change the timing or the amount of tube feedings to try to help stimulate Geralene's appetite. Do you notice certain times of the day when Ryen is able to focus or concentrate better? Try feeding her at those times.  Try putting a small amount of food Bitania likes on a plate or in a bowl. Tell her you want to see if she can finish 3 bites (one at a time, make sure she swallows each one first). We want her to feel successful. If she completes 3 bites, tell her she did a great job! We want eating to feel like a positive experience for her.  Stick with the same routine as much as you can. If she can consistently eat the 3 bites several days in a row, start trying it with 4 bites.   Memory/Attention You can use pictures to help keep Faryal's attention and to stimulate some conversation. Instead of quizzing Soren: "Do you know where that is?" "Do you remember ___?", try to ask her some open-ended questions:  -What do you see in this picture? -What can you tell me about this? -What do you think about _____?  If Brinlee doesn't recognize or remember anything, it's OK for you to give her some details. Tell her a brief story: "This is when we had Christmas at your Grandma's house. I remember when she used to fix ______." Maybe Mariadelaluz will be able to add some details and memories.   Try to invite or stimulate conversation instead of probing her for the "correct" answer.

## 2020-03-04 ENCOUNTER — Ambulatory Visit: Payer: Medicaid Other

## 2020-03-04 ENCOUNTER — Other Ambulatory Visit: Payer: Self-pay

## 2020-03-04 ENCOUNTER — Encounter: Payer: Medicaid Other | Admitting: Occupational Therapy

## 2020-03-04 ENCOUNTER — Ambulatory Visit: Payer: Medicaid Other | Admitting: Speech Pathology

## 2020-03-04 DIAGNOSIS — R262 Difficulty in walking, not elsewhere classified: Secondary | ICD-10-CM

## 2020-03-04 DIAGNOSIS — R1312 Dysphagia, oropharyngeal phase: Secondary | ICD-10-CM

## 2020-03-04 DIAGNOSIS — R2681 Unsteadiness on feet: Secondary | ICD-10-CM

## 2020-03-04 DIAGNOSIS — R471 Dysarthria and anarthria: Secondary | ICD-10-CM

## 2020-03-04 DIAGNOSIS — G931 Anoxic brain damage, not elsewhere classified: Secondary | ICD-10-CM

## 2020-03-04 DIAGNOSIS — R278 Other lack of coordination: Secondary | ICD-10-CM

## 2020-03-04 DIAGNOSIS — R41841 Cognitive communication deficit: Secondary | ICD-10-CM | POA: Diagnosis not present

## 2020-03-04 DIAGNOSIS — M6281 Muscle weakness (generalized): Secondary | ICD-10-CM

## 2020-03-04 DIAGNOSIS — R2689 Other abnormalities of gait and mobility: Secondary | ICD-10-CM

## 2020-03-04 NOTE — Therapy (Signed)
[place] MAIN [facility] SERVICES [address] [place], [place], [address] Phone: [phone]   Fax:  [phone]  Physical Therapy Treatment  Patient Details  Name: Beverly [name] MRN: [id number] Date of Birth: October 13, 1985 Referring Provider (PT): Mercy Riding, MD   Encounter Date: 03/04/2020   PT End of Session - 03/04/20 1547    Visit Number 18    Number of Visits 25    PT Start Time 0300    PT Stop Time 0345    PT Time Calculation (min) 45 min    Equipment Utilized During Treatment Gait belt    Activity Tolerance Patient tolerated treatment well    Behavior During Therapy [facility] for tasks assessed/performed           Past Medical History:  Diagnosis Date  . Brain injury ([place])   . CAD (coronary artery disease)   . HLD (hyperlipidemia)   . Hypertension    last pregnancy  . Ischemic cardiomyopathy   . STEMI (ST elevation myocardial infarction) ([place]) 09/2019   SCAD with aneurysmal dilation of proximal LAD.  . Vaginal Pap smear, abnormal    when she was 35yo  . Ventricular fibrillation ([place]) 09/23/2019    Past Surgical History:  Procedure Laterality Date  . IR GASTROSTOMY TUBE MOD SED  10/07/2019  . LEFT HEART CATH AND CORONARY ANGIOGRAPHY N/A 09/23/2019   Procedure: LEFT HEART CATH AND CORONARY ANGIOGRAPHY;  Surgeon: Nelva Bush, MD;  Location: [facility];  Service: Cardiovascular;  Laterality: N/A;    There were no vitals filed for this visit.   Subjective Assessment - 03/04/20 1545    Subjective Patient reports that she is doing well and is not feeling tired.    Patient is accompained by: Family member   mom   Pertinent History HTN    Limitations Sitting;Standing;Walking;House hold activities    Patient Stated Goals walk better    Currently in Pain? No/denies    Pain Score 0-No pain           Parallel bars  Forward/Retro walk with cueing for LE placement going backwards x2 laps Side stepping with  cueing to lift LEs instead of sliding them x2 laps High knee marching x2 laps  Ambulation with cone finding and placing x2 reps CGA Standing and bounce passing/dribbling with basketball several repetitions CGA  Seated Soccer ball kick with 2.5# cuff weights each LE                          PT Education - 03/04/20 1546    Education Details exercise technique    Person(s) Educated Patient    Methods Explanation    Comprehension Verbalized understanding            PT Short Term Goals - 03/02/20 1451      PT SHORT TERM GOAL #1   Title Patient/caregiver will report independence with HEP with caregiver assistance (All STGs Due: 01/07/2020)    Baseline reports independence; only completing 1-2x/week per mom reports 01/20/20  patient's caregiver assisting patient with HEP, 03/02/20  pt doing exercises at home regularly    Time 4    Period Weeks    Status Partially Met    Target Date 03/30/20      PT SHORT TERM GOAL #2   Title Patient will demo ability to complete transfer from w/c <> mat with CGA with LRAD to demonstrate improved functional mobility  Baseline intermittent CGA- Min A, 01/20/20  continues with intermittent need to transfer from WC<> mat no AD CGA, min assist or close supervision at home with transfers without use of AD.    Time 4    Period Weeks    Status Partially Met    Target Date 03/30/20      PT SHORT TERM GOAL #3   Title Patient will demo ability to ambulate >/= 200 ft  with LRAD or hand hand assist with moderate assistance to demonstrate improved functional mobility in home.    Baseline 115 ft max assist +2 hand held assist., 01/20/20  pt ambulating 175 ft with PT hand had assist in front of patient, no AD.  03/02/20  200 ft with PT hand held assist, min A    Time 4    Period Weeks    Status Achieved    Target Date 03/30/20             PT Long Term Goals - 03/02/20 1453      PT LONG TERM GOAL #1   Title Patient/caregiver will  report indepence with final HEP with caregiver assistance (All LTGS: 02/04/2020)    Baseline no HEP established, 01/20/20  caregiver assisting PT with home program    Time 8    Period Weeks    Status On-going      PT LONG TERM GOAL #2   Title Patient will demo ability to complete all bed mobility Mod I to demonstrate improved independence    Baseline CGA/Supervision, 01/20/20 supervision, intermitten mod I to perform bed mobility, 03/02/20 supervision with bed mobility by caregiver min assist on occasion    Time 8    Period Weeks    Status Partially Met    Target Date 03/30/20      PT LONG TERM GOAL #3   Title Patient will demo ability to complete sit <> stand x 5 reps with supervision to demonstrate improved balance and functional mobility    Baseline CGA - Min A, 01/20/20  intermittent min A to perform sit to stand, sits from a standing position. CGA for safety, 03/02/20  pt hesitant to stand alone preferred hand held assist from PT though patient has been able to demonstrate independence with this in previous visits    Time 8    Period Weeks    Status Partially Met    Target Date 03/30/20      PT LONG TERM GOAL #4   Title Patient will demo ability to ambulate > 400 ft w/ LRAD versus hand held assist min assist to demonstrate improved household mobility and short community distances.    Baseline TBA, 01/20/20  153f with PT in front of patient, hand held assist, no AD, 03/02/20  pt ambulating with hand held assist with PT in front, patient's gait pattern normalizing, less ataxic 200 ft prior to patient request to sit.    Time 8    Period Weeks    Status Partially Met    Target Date 03/30/20                 Plan - 03/04/20 1547    Clinical Impression Statement Patient tolerated treatment well with improved articulation during conversation.  The patient with improved ability to sit to stand and problem solve with transitional movements this visit.  The patient continues to  benefit from additional skilled PT services to further improve motor control and coordination for improved quality of life.  Personal Factors and Comorbidities Comorbidity 1;Behavior Pattern;Transportation    Comorbidities HTN    Examination-Activity Limitations Bed Mobility;Dressing;Hygiene/Grooming;Caring for Others;Stairs;Stand;Toileting;Transfers;Sit;Bathing    Examination-Participation Restrictions Occupation;Medication Management;Cleaning    Stability/Clinical Decision Making Evolving/Moderate complexity    Rehab Potential Good    PT Frequency 2x / week    PT Duration 8 weeks    PT Treatment/Interventions ADLs/Self Care Home Management;Aquatic Therapy;Electrical Stimulation;DME Instruction;Gait training;Stair training;Functional mobility training;Therapeutic activities;Therapeutic exercise;Balance training;Neuromuscular re-education;Wheelchair mobility training;Patient/family education;Orthotic Fit/Training;Manual techniques;Passive range of motion    PT Next Visit Plan FYI- Pt does have feeding tube still. Pt is impulsive. Continue to work on seated and standing balance, BLE strengthening, coordination activities, core strengthening. Gait training with hand held assist of 2 people. Did well with 1 person in front and 1 in back with decreasing support today.    Consulted and Agree with Plan of Care Patient;Family member/caregiver    Family Member Consulted Mom/Dad           Patient will benefit from skilled therapeutic intervention in order to improve the following deficits and impairments:  Abnormal gait,Decreased balance,Decreased endurance,Decreased mobility,Difficulty walking,Impaired tone,Impaired sensation,Pain,Impaired UE functional use,Decreased strength,Decreased safety awareness,Decreased knowledge of use of DME,Decreased coordination,Decreased activity tolerance,Decreased cognition,Decreased range of motion  Visit Diagnosis: Anoxic brain injury ([place])  Unsteadiness on  feet  Muscle weakness (generalized)  Other abnormalities of gait and mobility  Other lack of coordination  Difficulty in walking, not elsewhere classified     Problem List Patient Active Problem List   Diagnosis Date Noted  . Oropharyngeal dysphagia 02/12/2020  . Coronary artery disease involving native coronary artery of native heart without angina pectoris 12/12/2019  . Ischemic cardiomyopathy 12/12/2019  . Brain injury ([place])   . Prediabetes   . Anoxic brain injury ([place]) 11/05/2019  . Dysphagia 11/05/2019  . Physical deconditioning 11/05/2019  . Acute delirium 11/05/2019  . Respiratory failure ([place])   . Acute metabolic encephalopathy   . Encounter for central line placement   . Ventricular fibrillation ([place]) 09/23/2019  . Acute combined systolic and diastolic heart failure ([place]) 09/23/2019  . Ventricular tachycardia, polymorphic ([place]) 09/23/2019  . Pelvic pain affecting pregnancy 10/15/2015  . Supervision of normal pregnancy in third trimester 09/28/2015  . Poor weight gain of pregnancy 09/28/2015  . Iron deficiency anemia of pregnancy 09/01/2015  . Increased BMI (body mass index) 07/29/2015    Hal Morales PT, DPT 03/04/2020, 3:56 PM  [place] MAIN [facility] SERVICES [address] [place], [place], [address] Phone: [phone]   Fax:  [phone]  Name: REESHA DEBES MRN: [id number] Date of Birth: 21-May-1985

## 2020-03-04 NOTE — Therapy (Signed)
[place] MAIN [facility] SERVICES [address] [place], [place], [address] Phone: [phone]   Fax:  [phone]  Speech Language Pathology Treatment  Patient Details  Name: Beverly [name] MRN: [id number] Date of Birth: 1985-12-03 Referring Provider (SLP): Dr. Wendee Beavers   Encounter Date: 03/04/2020   End of Session - 03/04/20 1638    Visit Number 14    Number of Visits 25    Date for SLP Re-Evaluation 03/03/20    Authorization Time Period 11 ST visits after 1/1    Authorization - Visit Number 6    Authorization - Number of Visits 11    Progress Note Due on Visit 10    SLP Start Time 1400    SLP Stop Time  1455    SLP Time Calculation (min) 55 min    Activity Tolerance Patient tolerated treatment well           Past Medical History:  Diagnosis Date  . Brain injury ([place])   . CAD (coronary artery disease)   . HLD (hyperlipidemia)   . Hypertension    last pregnancy  . Ischemic cardiomyopathy   . STEMI (ST elevation myocardial infarction) ([place]) 09/2019   SCAD with aneurysmal dilation of proximal LAD.  . Vaginal Pap smear, abnormal    when she was 35yo  . Ventricular fibrillation ([place]) 09/23/2019    Past Surgical History:  Procedure Laterality Date  . IR GASTROSTOMY TUBE MOD SED  10/07/2019  . LEFT HEART CATH AND CORONARY ANGIOGRAPHY N/A 09/23/2019   Procedure: LEFT HEART CATH AND CORONARY ANGIOGRAPHY;  Surgeon: Nelva Bush, MD;  Location: [facility];  Service: Cardiovascular;  Laterality: N/A;    There were no vitals filed for this visit.   Subjective Assessment - 03/04/20 1614    Subjective "Hey"    Currently in Pain? No/denies                 ADULT SLP TREATMENT - 03/04/20 1615      General Information   Behavior/Cognition Alert;Cooperative;Distractible;Pleasant mood;Requires cueing;Decreased sustained attention    HPI Pt is a 35 y.o. female with PMH of HTN who was admitted 09/23/19 2 weeks post  partum after emergent C section due to rupture of membranes with VF arrest. Pt with enterobacter PNA and encephalopathy, trach placed 8/30 and PEG on 8/31. MRI 8/31: No evidence of anoxic brain injury. Pt changed to #4 cuffless trach on 9/15; capped on 9/21 and decannulated on 9/25. MBS 9/22, POs started - primary oral dysphagia.   Pt currently using PEG as primary nutrition and hydration and for some meds, due to behavior.      Treatment Provided   Treatment provided Dysphagia;Cognitive-Linquistic      Dysphagia Treatment   Temperature Spikes Noted No    Respiratory Status Room air    Oral Cavity - Dentition Adequate natural dentition    Treatment Methods Skilled observation;Compensation strategy training;Patient/caregiver education    Patient observed directly with PO's Yes    Type of PO's observed Dysphagia 3 (soft);Thin liquids    Feeding Needs assist;Needs set up    Liquids provided via Straw    Oral Phase Signs & Symptoms Oral holding;Prolonged bolus formation    Pharyngeal Phase Signs & Symptoms Other (comment)   only initiated 1 pharyngeal swallow, no sx aspiration   Type of cueing Verbal;Tactile;Visual    Amount of cueing Maximal    Other treatment/comments DYSPHAGIA (15 minutes): Used mirror to aid pt  attention to mastication, swallowing. Beverly [name] self-fed one bite of chocolate candy; even with max cues, liquid wash to aid oral transit she did not initiate swallow; expectorated upon command. Initiated 1 swallow with moderate oral residue, however further attempts/trials were unsuccessful; pt expectorated with mod-max cues.      Cognitive-Linquistic Treatment   Treatment focused on Patient/family/caregiver education;Cognition    Skilled Treatment Did not bring memory book or book from home today. Targeted dysarthria in word level tasks with cognitive load. Rare min cues for slow rate. Pt required cues for sustained attention 2-3 minutes during this task today. Pt generated 2 items in  simple categories ~90% of the time, but required mod cues for 3 items 70% of the time.      Assessment / Recommendations / Plan   Plan Continue with current plan of care      Progression Toward Goals   Progression toward goals Progressing toward goals            SLP Education - 03/04/20 1637    Education Details try using mirror throughout feeding    Person(s) Educated Patient;Parent(s)    Methods Explanation    Comprehension Verbalized understanding            SLP Short Term Goals - 02/17/20 1702      SLP SHORT TERM GOAL #1   Title Beverly [name] will sustain attention for 5 minutes to simple cognitive task with occasional min A over 3 sessions    Baseline 1-2 minutes    Time 5    Period Weeks    Status Partially Met      SLP SHORT TERM GOAL #2   Title Pt will verbalize 2 safety rules for walking at home with occasional min A over 3 sessions    Baseline pt states she can walk by herself    Time 5    Period Weeks    Status Partially Met      SLP SHORT TERM GOAL #3   Title Pt will use simple external aid to follow simple schedule and prepare for appointments with usual min A from family    Baseline does not recall or prepare for appointments    Time 5    Period Weeks    Status Partially Met      SLP SHORT TERM GOAL #4   Title Pt will use visual and written word cues to wash her face with usual min verbal cues from mom over 3 sessions    Baseline Per mom, pt only touches her face 2-3 times when asked to wash her face    Time 5    Period Weeks    Status Achieved            SLP Long Term Goals - 02/17/20 1702      SLP LONG TERM GOAL #1   Title Pt will use external aids/to do list to complete 3 simple chores a day with occasional min A over 3 sessions    Time 11    Period Weeks    Status On-going      SLP LONG TERM GOAL #2   Title Pt will sustain attention for 10 minutes to simple cognitive task with 4 or less re-directions over 3 sessions    Baseline sustains  attention for 1-2 minutes    Time 11    Period Weeks    Status On-going      SLP LONG TERM GOAL #3   Title Pt will use  visual cues and schedule to eat 2 meals a day and drink 24 oz of liquid with usual min A over 3 sessions    Baseline pt eating less than 1 meal a day and taking only sips (PEG is used)    Time 11    Period Weeks    Status On-going      SLP LONG TERM GOAL #4   Title Pt will use reduced rate to be intellgible in 5 minute conversation with occasional min A    Baseline Rapid rate, rushes of speech, 35% of utterances unintelligible    Time 11    Period Weeks    Status On-going            Plan - 03/04/20 1639    Clinical Impression Statement Beverly [name] continues with cognitively-based dysphagia; suspect sensory deficits may also be contributing to reduced bolus formation, oral transfer. Use of liquids did not facilitate oral transit today. Intelligibility improved in word level tasks; patient required significantly less cuing for slow rate today. Continue to educate on functional activities to target Beverly [name]'s attention/engagement. Continue skilled ST to maximize cognition for improved oral intake, safety, and quality of life.    Speech Therapy Frequency 2x / week    Duration 12 weeks   or 25 visits   Treatment/Interventions Language facilitation;Environmental controls;Cueing hierarchy;SLP instruction and feedback;Aspiration precaution training;Compensatory techniques;Cognitive reorganization;Functional tasks;Compensatory strategies;Diet toleration management by SLP;Trials of upgraded texture/liquids;Internal/external aids;Multimodal communcation approach;Patient/family education    Potential to Achieve Goals Fair    Potential Considerations Severity of impairments;Financial resources           Patient will benefit from skilled therapeutic intervention in order to improve the following deficits and impairments:   Dysphagia, oropharyngeal phase  Dysarthria and  anarthria  Anoxic brain injury [facility])    Problem List Patient Active Problem List   Diagnosis Date Noted  . Oropharyngeal dysphagia 02/12/2020  . Coronary artery disease involving native coronary artery of native heart without angina pectoris 12/12/2019  . Ischemic cardiomyopathy 12/12/2019  . Brain injury ([place])   . Prediabetes   . Anoxic brain injury ([place]) 11/05/2019  . Dysphagia 11/05/2019  . Physical deconditioning 11/05/2019  . Acute delirium 11/05/2019  . Respiratory failure ([place])   . Acute metabolic encephalopathy   . Encounter for central line placement   . Ventricular fibrillation ([place]) 09/23/2019  . Acute combined systolic and diastolic heart failure ([place]) 09/23/2019  . Ventricular tachycardia, polymorphic ([place]) 09/23/2019  . Pelvic pain affecting pregnancy 10/15/2015  . Supervision of normal pregnancy in third trimester 09/28/2015  . Poor weight gain of pregnancy 09/28/2015  . Iron deficiency anemia of pregnancy 09/01/2015  . Increased BMI (body mass index) 07/29/2015   Deneise Lever, [place], CCC-SLP Speech-Language Pathologist  Aliene Altes 03/04/2020, 4:45 PM  [place] MAIN [facility] SERVICES [address] [place], [place], [address] Phone: [phone]   Fax:  [phone]   Name: Beverly [name] MRN: [id number] Date of Birth: [birth year]

## 2020-03-09 ENCOUNTER — Ambulatory Visit: Payer: Medicaid Other

## 2020-03-09 ENCOUNTER — Other Ambulatory Visit: Payer: Self-pay

## 2020-03-09 ENCOUNTER — Encounter: Payer: Self-pay | Admitting: Occupational Therapy

## 2020-03-09 ENCOUNTER — Ambulatory Visit: Payer: Medicaid Other | Attending: Student | Admitting: Speech Pathology

## 2020-03-09 ENCOUNTER — Ambulatory Visit: Payer: Medicaid Other | Admitting: Occupational Therapy

## 2020-03-09 DIAGNOSIS — R41841 Cognitive communication deficit: Secondary | ICD-10-CM | POA: Diagnosis not present

## 2020-03-09 DIAGNOSIS — R471 Dysarthria and anarthria: Secondary | ICD-10-CM | POA: Diagnosis present

## 2020-03-09 DIAGNOSIS — G931 Anoxic brain damage, not elsewhere classified: Secondary | ICD-10-CM

## 2020-03-09 DIAGNOSIS — R262 Difficulty in walking, not elsewhere classified: Secondary | ICD-10-CM | POA: Insufficient documentation

## 2020-03-09 DIAGNOSIS — R1312 Dysphagia, oropharyngeal phase: Secondary | ICD-10-CM

## 2020-03-09 DIAGNOSIS — R2689 Other abnormalities of gait and mobility: Secondary | ICD-10-CM | POA: Insufficient documentation

## 2020-03-09 DIAGNOSIS — R278 Other lack of coordination: Secondary | ICD-10-CM | POA: Insufficient documentation

## 2020-03-09 DIAGNOSIS — M6281 Muscle weakness (generalized): Secondary | ICD-10-CM

## 2020-03-09 DIAGNOSIS — R2681 Unsteadiness on feet: Secondary | ICD-10-CM | POA: Diagnosis present

## 2020-03-09 NOTE — Therapy (Signed)
[place] MAIN [facility] SERVICES [address] [place], [place], [address] Phone: [phone]   Fax:  [phone]  Speech Language Pathology Treatment/ Recertification  Patient Details  Name: Beverly [name] MRN: [id number] Date of Birth: Jul 12, 1985 Referring Provider (SLP): Dr. Wendee Beavers   Encounter Date: 03/09/2020   End of Session - 03/09/20 1425    Visit Number 15    Number of Visits 25    Date for SLP Re-Evaluation 05/08/20    Authorization - Visit Number 7    Authorization - Number of Visits 11    Progress Note Due on Visit 10    SLP Start Time 4235    SLP Stop Time  1358    SLP Time Calculation (min) 56 min    Activity Tolerance Patient tolerated treatment well           Past Medical History:  Diagnosis Date  . Brain injury ([place])   . CAD (coronary artery disease)   . HLD (hyperlipidemia)   . Hypertension    last pregnancy  . Ischemic cardiomyopathy   . STEMI (ST elevation myocardial infarction) ([place]) 09/2019   SCAD with aneurysmal dilation of proximal LAD.  . Vaginal Pap smear, abnormal    when she was 35yo  . Ventricular fibrillation ([place]) 09/23/2019    Past Surgical History:  Procedure Laterality Date  . IR GASTROSTOMY TUBE MOD SED  10/07/2019  . LEFT HEART CATH AND CORONARY ANGIOGRAPHY N/A 09/23/2019   Procedure: LEFT HEART CATH AND CORONARY ANGIOGRAPHY;  Surgeon: Nelva Bush, MD;  Location: [place] [facility];  Service: Cardiovascular;  Laterality: N/A;    There were no vitals filed for this visit.   Subjective Assessment - 03/09/20 1415    Subjective Forgot memory book today    Currently in Pain? No/denies                 ADULT SLP TREATMENT - 03/09/20 1415      General Information   Behavior/Cognition Alert;Cooperative;Distractible;Pleasant mood;Requires cueing;Decreased sustained attention    HPI Pt is a 35 y.o. female with PMH of HTN who was admitted 09/23/19 2 weeks post partum after emergent  C section due to rupture of membranes with VF arrest. Pt with enterobacter PNA and encephalopathy, trach placed 8/30 and PEG on 8/31. MRI 8/31: No evidence of anoxic brain injury. Pt changed to #4 cuffless trach on 9/15; capped on 9/21 and decannulated on 9/25. MBS 9/22, POs started - primary oral dysphagia.   Pt currently using PEG as primary nutrition and hydration and for some meds, due to behavior.      Treatment Provided   Treatment provided Cognitive-Linquistic      Pain Assessment   Pain Assessment No/denies pain      Cognitive-Linquistic Treatment   Treatment focused on Patient/family/caregiver education;Cognition    Skilled Treatment Forgot memory book today, but mom continues to have pt read this at home. Reports "She reads it so fast" and was not responsive to pointing to words as she was in [place] session. Suggested using a piece of paper to cover text and slow rate. Continue to educate mother that dysphagia appears most related to pt's attention, and will continue to target attention in therapy activities. Today targeted sustained attention with variety of activities. Vigilance monitoring for right card in a sequence 79% accuracy over 6 minutes with occasional mod cues (pt looking around the room). Match arrows with directions 100% accuracy min cues 3 minutes. Alphabetize  alternating word lists 85% accuracy, remained engaged in task for 24 minutes with mod cues. Find coin amounts 75% accuracy with extended time, calculate amounts mod-max cues.      Assessment / Recommendations / Plan   Plan Continue with current plan of care;Goals updated      Progression Toward Goals   Progression toward goals Progressing toward goals            SLP Education - 03/09/20 1424    Education Details simple attention activities for [name] at home    Person(s) Educated Patient;Parent(s)    Methods Explanation    Comprehension Verbalized understanding            SLP Short Term Goals - 03/09/20  1427      SLP SHORT TERM GOAL #1   Title Karinda will sustain attention for 5 minutes to simple cognitive task with occasional min A over 3 sessions    Time 5    Period Weeks    Status Achieved      SLP SHORT TERM GOAL #2   Title Pt will verbalize 2 safety rules for walking at home with occasional min A over 3 sessions    Baseline able to verbalize with use of memory book    Time 5    Period Weeks    Status Achieved      SLP SHORT TERM GOAL #3   Title Pt will use simple external aid to follow simple schedule and prepare for appointments with usual min A from family    Baseline does not recall or prepare for appointments    Time 5    Period Weeks    Status Partially Met      SLP SHORT TERM GOAL #4   Title Pt will use visual and written word cues to wash her face with usual min verbal cues from mom over 3 sessions    Baseline Per mom, pt only touches her face 2-3 times when asked to wash her face    Time 5    Period Weeks    Status Achieved            SLP Long Term Goals - 03/09/20 1428      SLP LONG TERM GOAL #1   Title Pt will use external aids/to do list to complete 3 simple chores a day with occasional min A over 3 sessions    Time 11    Period Weeks    Status On-going      SLP LONG TERM GOAL #2   Title Pt will sustain attention for 10 minutes to simple cognitive task with 4 or less re-directions over 3 sessions    Baseline sustains attention for 5-10 minutes inconsistently    Time 11    Period Weeks    Status Partially Met      SLP LONG TERM GOAL #3   Title Pt will use visual cues and schedule to eat 4 oz solids and drink 8 oz of liquid with usual min A over 3 sessions    Baseline pt eating less than 1 meal a day and taking only sips (PEG is used)    Time 11    Period Weeks    Status Revised      SLP LONG TERM GOAL #4   Title Pt will use reduced rate to be intellgible in 5 minute conversation with occasional min A    Baseline Rapid rate, rushes of speech,  improved intelligibility to ~50% of  utterances    Time 11    Period Weeks    Status Partially Met            Plan - 03/09/20 1425    Clinical Impression Statement Beverly [name] continues with cognitively-based dysphagia with loss of attention to bolus. Improved engagement with therapy activities today; responds well to positive encouragement. Able to inhibit impulsivity in simple attention tasks with cues. Continue to educate on functional activities to target Shalan's attention/engagement. Continue skilled ST to maximize cognition for improved oral intake, safety, and quality of life.    Speech Therapy Frequency 2x / week    Duration 12 weeks   or 25 visits   Treatment/Interventions Language facilitation;Environmental controls;Cueing hierarchy;SLP instruction and feedback;Aspiration precaution training;Compensatory techniques;Cognitive reorganization;Functional tasks;Compensatory strategies;Diet toleration management by SLP;Trials of upgraded texture/liquids;Internal/external aids;Multimodal communcation approach;Patient/family education    Potential to Achieve Goals Fair    Potential Considerations Severity of impairments;Financial resources           Patient will benefit from skilled therapeutic intervention in order to improve the following deficits and impairments:   Cognitive communication deficit  Dysarthria and anarthria  Dysphagia, oropharyngeal phase  Anoxic brain injury [facility])    Problem List Patient Active Problem List   Diagnosis Date Noted  . Oropharyngeal dysphagia 02/12/2020  . Coronary artery disease involving native coronary artery of native heart without angina pectoris 12/12/2019  . Ischemic cardiomyopathy 12/12/2019  . Brain injury ([place])   . Prediabetes   . Anoxic brain injury ([place]) 11/05/2019  . Dysphagia 11/05/2019  . Physical deconditioning 11/05/2019  . Acute delirium 11/05/2019  . Respiratory failure ([place])   . Acute metabolic encephalopathy   .  Encounter for central line placement   . Ventricular fibrillation ([place]) 09/23/2019  . Acute combined systolic and diastolic heart failure ([place]) 09/23/2019  . Ventricular tachycardia, polymorphic ([place]) 09/23/2019  . Pelvic pain affecting pregnancy 10/15/2015  . Supervision of normal pregnancy in third trimester 09/28/2015  . Poor weight gain of pregnancy 09/28/2015  . Iron deficiency anemia of pregnancy 09/01/2015  . Increased BMI (body mass index) 07/29/2015   Deneise Lever, [place], [place] 03/09/2020, 2:33 PM  [place] MAIN [facility] SERVICES [address] [place], [place], [address] Phone: [phone]   Fax:  [phone]   Name: Beverly [name] MRN: [id number] Date of Birth: 13-Apr-1985

## 2020-03-09 NOTE — Therapy (Signed)
[place] [facility] MAIN [facility] SERVICES [address] [place], [place], [address] Phone: [phone]   Fax:  [phone]  Occupational Therapy Treatment  Patient Details  Name: Beverly [name] MRN: [id number] Date of Birth: 08-12-1985 Referring Provider (OT): Candelaria Stagers MD   Encounter Date: 03/09/2020   OT End of Session - 03/09/20 1806    Visit Number 12    Number of Visits 24    Date for OT Re-Evaluation 03/16/20    Authorization Type Medicaid OON    OT Start Time 1400    OT Stop Time 1445    OT Time Calculation (min) 45 min    Activity Tolerance Patient tolerated treatment well    Behavior During Therapy [facility] for tasks assessed/performed           Past Medical History:  Diagnosis Date  . Brain injury ([facility])   . CAD (coronary artery disease)   . HLD (hyperlipidemia)   . Hypertension    last pregnancy  . Ischemic cardiomyopathy   . STEMI (ST elevation myocardial infarction) ([facility]) 09/2019   SCAD with aneurysmal dilation of proximal LAD.  . Vaginal Pap smear, abnormal    when she was 35yo  . Ventricular fibrillation ([facility]) 09/23/2019    Past Surgical History:  Procedure Laterality Date  . IR GASTROSTOMY TUBE MOD SED  10/07/2019  . LEFT HEART CATH AND CORONARY ANGIOGRAPHY N/A 09/23/2019   Procedure: LEFT HEART CATH AND CORONARY ANGIOGRAPHY;  Surgeon: Yvonne Kendall, MD;  Location: [facility] INVASIVE CV LAB;  Service: Cardiovascular;  Laterality: N/A;    There were no vitals filed for this visit.   Subjective Assessment - 03/09/20 1806    Subjective  Pt. was present with her mother    Patient is accompanied by: Family member    Pertinent History On 09/23/19 patient presented to ED 2 weeks postpartum (had emergent C-Section) with chest pain. In ED sustained VFib Cardiac Arrest requiring intubation. EKG showed ST elevation, Cath Lab showed aneurysmal dissection of the LAD was identified. Course complicated by encephalopathy, anoxic brain injury,  enterobacterial PNAPMH: HTN    Currently in Pain? No/denies          OT TREATMENT    Neuro muscular re-education:  Pt. worked on grasping 1" resistive cubes with her right hand, transferring the cubes between her right hand, and left hands.  Pt. worked in the reverse direction when placing them back onto the vertical board. Pt. Pt. Worked on pressing them back into place with isolated 2nd digit.  Orthothic Fitting:  Pt. was provided with bilateral resting hand splints for nighttime use. Pt. tolerated the medium fit without difficulty. Education was provided about the proper splint application, and care with the pt., and the caregiver.  Pt. is making steady progress with bilateral hand functioning. Pt. was able to transfer items between her hands with improving motor control, and coordination today. Pt. Has more difficulty transferring items from her left hand to her right hand. Pt. requires verbal, and tactile cues, as well as cues for visual demonstration. Pt. continues to work on improving BUE motor control, and [facility] skills in order to work towards improving, and maximizing independence with ADLs, and IADL tasks.                          OT Education - 03/09/20 1806    Education Details FMC, bilateral hand coordination    Person(s) Educated Patient;Parent(s)  Methods Explanation    Comprehension Need further instruction            OT Short Term Goals - 02/17/20 1503      OT SHORT TERM GOAL #1   Title Pt/ caregiver will be I with inital HEP.    Baseline Independent    Status Achieved      OT SHORT TERM GOAL #2   Period Weeks      OT SHORT TERM GOAL #3   Baseline 1 min with re-direction      OT SHORT TERM GOAL #4   Title Pt will wash her face and brush her teeth with no more than min v.c for thoroughness    Baseline Independent with verbal cues, and set-up.    Time 6    Period Weeks    Status On-going      OT SHORT TERM GOAL #6   Title Pt  will demonstrate ability to sequence a simple functional or ADL task with no more than min v.c    Baseline Pt requires grossly min-modA with sequencing simple activities    Period Weeks    Status On-going             OT Long Term Goals - 02/17/20 1510      OT LONG TERM GOAL #1   Title Pt will perform LB bathing and dressing with no more than min A.    Baseline ModA    Time 12    Period Weeks    Status On-going    Target Date 03/16/20      OT LONG TERM GOAL #2   Title Pt will demonstrate improved bilateral UE functional use by increasing box/ blocks score by 6 blocks from inital measurement.    Baseline RUE 10 blocks, LUE 12 blocks    Time 12    Status Deferred      OT LONG TERM GOAL #3   Title Pt will perform simple cold snack prep with min A.    Baseline dependent    Time 12    Period Weeks    Status On-going    Target Date 03/16/20      OT LONG TERM GOAL #4   Title Pt will attend to an ADL/ simple functional familiar task for 10 mins.    Baseline attends for grossly 1 min with re-direct.    Time 12    Period Weeks    Status On-going      OT LONG TERM GOAL #5   Title Pt perform a simple home management task with no more than min A, demonstrating good safety awareness.    Baseline dependent    Time 12    Period Weeks    Status On-going    Target Date 03/16/20      OT LONG TERM GOAL #6   Title Pt will write her name with 75% legibility    Baseline Pt. is able to formulate several letters of her name.    Time 13    Period Weeks    Status On-going    Target Date 03/16/20                 Plan - 03/09/20 1807    Clinical Impression Statement Pt. is making steady progress with bilateral hand functioning. Pt. was able to transfer items between her hands with improving motor control, and coordination today. Pt. Has more difficulty transferring items from her left hand to her right hand. Pt.  requires verbal, and tactile cues, as well as cues for visual  demonstration. Pt. continues to work on improving BUE motor control, and [facility] skills in order to work towards improving, and maximizing independence with ADLs, and IADL tasks.   OT Occupational Profile and History Detailed Assessment- Review of Records and additional review of physical, cognitive, psychosocial history related to current functional performance    Occupational performance deficits (Please refer to evaluation for details): ADL's;IADL's;Work;Leisure;Social Participation    Body Structure / Function / Physical Skills ADL;Endurance;UE functional use;Balance;Pain;Flexibility;FMC;ROM;GMC;Coordination;Decreased knowledge of precautions;Decreased knowledge of use of DME;IADL;Dexterity;Strength;Mobility;Tone;Vision;Sensation;Gait    Cognitive Skills Attention;Energy/Drive;Memory;Orientation;Problem Solve;Safety Awareness;Thought;Understand    Rehab Potential Good    Clinical Decision Making Several treatment options, min-mod task modification necessary    Comorbidities Affecting Occupational Performance: May have comorbidities impacting occupational performance    Modification or Assistance to Complete Evaluation  Min-Moderate modification of tasks or assist with assess necessary to complete eval    OT Frequency 2x / week    OT Duration 12 weeks    OT Treatment/Interventions Self-care/ADL training;Ultrasound;Visual/perceptual remediation/compensation;Patient/family education;Scar mobilization;Aquatic Therapy;Paraffin;Passive range of motion;Balance training;Dry needling;Stair Training;Fluidtherapy;Cryotherapy;Splinting;Moist Heat;Manual Therapy;Therapeutic exercise;Therapeutic activities;Functional Mobility Training;Neuromuscular education;Cognitive remediation/compensation    Plan ADL strategies, sitting balance and trunk control    Consulted and Agree with Plan of Care Patient;Family member/caregiver           Patient will benefit from skilled therapeutic intervention in order to improve  the following deficits and impairments:   Body Structure / Function / Physical Skills: ADL,Endurance,UE functional use,Balance,Pain,Flexibility,FMC,ROM,GMC,Coordination,Decreased knowledge of precautions,Decreased knowledge of use of DME,IADL,Dexterity,Strength,Mobility,Tone,Vision,Sensation,Gait Cognitive Skills: Attention,Energy/Drive,Memory,Orientation,Problem Solve,Safety Awareness,Thought,Understand     Visit Diagnosis: Muscle weakness (generalized)  Other lack of coordination    Problem List Patient Active Problem List   Diagnosis Date Noted  . Oropharyngeal dysphagia 02/12/2020  . Coronary artery disease involving native coronary artery of native heart without angina pectoris 12/12/2019  . Ischemic cardiomyopathy 12/12/2019  . Brain injury ([facility])   . Prediabetes   . Anoxic brain injury ([facility]) 11/05/2019  . Dysphagia 11/05/2019  . Physical deconditioning 11/05/2019  . Acute delirium 11/05/2019  . Respiratory failure ([facility])   . Acute metabolic encephalopathy   . Encounter for central line placement   . Ventricular fibrillation ([facility]) 09/23/2019  . Acute combined systolic and diastolic heart failure ([facility]) 09/23/2019  . Ventricular tachycardia, polymorphic ([facility]) 09/23/2019  . Pelvic pain affecting pregnancy 10/15/2015  . Supervision of normal pregnancy in third trimester 09/28/2015  . Poor weight gain of pregnancy 09/28/2015  . Iron deficiency anemia of pregnancy 09/01/2015  . Increased BMI (body mass index) 07/29/2015    Olegario Messier, MS, OTR/L 03/09/2020, 6:10 PM  [place] [facility] MAIN [facility] SERVICES [address] [place], [place], [address] Phone: [phone]   Fax:  [phone]  Name: Beverly [name] MRN: [id number] Date of Birth: 03-06-1985

## 2020-03-11 ENCOUNTER — Encounter: Payer: Medicaid Other | Admitting: Occupational Therapy

## 2020-03-11 ENCOUNTER — Ambulatory Visit: Payer: Medicaid Other

## 2020-03-11 ENCOUNTER — Other Ambulatory Visit: Payer: Self-pay

## 2020-03-11 ENCOUNTER — Ambulatory Visit: Payer: Medicaid Other | Admitting: Speech Pathology

## 2020-03-11 DIAGNOSIS — R262 Difficulty in walking, not elsewhere classified: Secondary | ICD-10-CM

## 2020-03-11 DIAGNOSIS — R41841 Cognitive communication deficit: Secondary | ICD-10-CM

## 2020-03-11 DIAGNOSIS — R2681 Unsteadiness on feet: Secondary | ICD-10-CM

## 2020-03-11 DIAGNOSIS — G931 Anoxic brain damage, not elsewhere classified: Secondary | ICD-10-CM

## 2020-03-11 DIAGNOSIS — M6281 Muscle weakness (generalized): Secondary | ICD-10-CM

## 2020-03-11 DIAGNOSIS — R2689 Other abnormalities of gait and mobility: Secondary | ICD-10-CM

## 2020-03-11 DIAGNOSIS — R278 Other lack of coordination: Secondary | ICD-10-CM

## 2020-03-11 NOTE — Therapy (Signed)
[place] MAIN [facility] SERVICES [address] [place], [place], [address] Phone: [phone]   Fax:  [phone]  Physical Therapy Treatment  Patient Details  Name: Beverly [name] MRN: [id number] Date of Birth: 12/05/1985 Referring Provider (PT): Mercy Riding, MD   Encounter Date: 03/11/2020   PT End of Session - 03/11/20 1450    Visit Number 19    Number of Visits 25    PT Start Time 0200    PT Stop Time 0240    PT Time Calculation (min) 40 min    Equipment Utilized During Treatment Gait belt    Activity Tolerance Patient tolerated treatment well    Behavior During Therapy [facility] for tasks assessed/performed           Past Medical History:  Diagnosis Date  . Brain injury ([place])   . CAD (coronary artery disease)   . HLD (hyperlipidemia)   . Hypertension    last pregnancy  . Ischemic cardiomyopathy   . STEMI (ST elevation myocardial infarction) ([place]) 09/2019   SCAD with aneurysmal dilation of proximal LAD.  . Vaginal Pap smear, abnormal    when she was 35yo  . Ventricular fibrillation ([place]) 09/23/2019    Past Surgical History:  Procedure Laterality Date  . IR GASTROSTOMY TUBE MOD SED  10/07/2019  . LEFT HEART CATH AND CORONARY ANGIOGRAPHY N/A 09/23/2019   Procedure: LEFT HEART CATH AND CORONARY ANGIOGRAPHY;  Surgeon: Nelva Bush, MD;  Location: [facility];  Service: Cardiovascular;  Laterality: N/A;    There were no vitals filed for this visit.   Subjective Assessment - 03/11/20 1449    Subjective The patient states she is feeling good.    Patient is accompained by: Family member   mom   Pertinent History HTN    Limitations Sitting;Standing;Walking;House hold activities    Patient Stated Goals walk better    Currently in Pain? No/denies    Pain Score 0-No pain              INTERVENTIONS  Ambulation with cone finding and placing x2 reps CGA Ball throw into the bin with CGA Picking up cones from the  floor with CGA,  Step ups forward to 4 inch step x10  The patient demonstrates difficulty placing and picking up ball/cones in the clinic  Seated Soccer ball kick with 2.5# cuff weights each LE Foot on soccer ball 2.5# cuff weights each LE                         PT Education - 03/11/20 1449    Education Details sit to stand    Person(s) Educated Patient    Methods Explanation    Comprehension Verbalized understanding;Need further instruction            PT Short Term Goals - 03/02/20 1451      PT SHORT TERM GOAL #1   Title Patient/caregiver will report independence with HEP with caregiver assistance (All STGs Due: 01/07/2020)    Baseline reports independence; only completing 1-2x/week per mom reports 01/20/20  patient's caregiver assisting patient with HEP, 03/02/20  pt doing exercises at home regularly    Time 4    Period Weeks    Status Partially Met    Target Date 03/30/20      PT SHORT TERM GOAL #2   Title Patient will demo ability to complete transfer from w/c <> mat with CGA with LRAD  to demonstrate improved functional mobility    Baseline intermittent CGA- Min A, 01/20/20  continues with intermittent need to transfer from WC<> mat no AD CGA, min assist or close supervision at home with transfers without use of AD.    Time 4    Period Weeks    Status Partially Met    Target Date 03/30/20      PT SHORT TERM GOAL #3   Title Patient will demo ability to ambulate >/= 200 ft  with LRAD or hand hand assist with moderate assistance to demonstrate improved functional mobility in home.    Baseline 115 ft max assist +2 hand held assist., 01/20/20  pt ambulating 175 ft with PT hand had assist in front of patient, no AD.  03/02/20  200 ft with PT hand held assist, min A    Time 4    Period Weeks    Status Achieved    Target Date 03/30/20             PT Long Term Goals - 03/02/20 1453      PT LONG TERM GOAL #1   Title Patient/caregiver will report  indepence with final HEP with caregiver assistance (All LTGS: 02/04/2020)    Baseline no HEP established, 01/20/20  caregiver assisting PT with home program    Time 8    Period Weeks    Status On-going      PT LONG TERM GOAL #2   Title Patient will demo ability to complete all bed mobility Mod I to demonstrate improved independence    Baseline CGA/Supervision, 01/20/20 supervision, intermitten mod I to perform bed mobility, 03/02/20 supervision with bed mobility by caregiver min assist on occasion    Time 8    Period Weeks    Status Partially Met    Target Date 03/30/20      PT LONG TERM GOAL #3   Title Patient will demo ability to complete sit <> stand x 5 reps with supervision to demonstrate improved balance and functional mobility    Baseline CGA - Min A, 01/20/20  intermittent min A to perform sit to stand, sits from a standing position. CGA for safety, 03/02/20  pt hesitant to stand alone preferred hand held assist from PT though patient has been able to demonstrate independence with this in previous visits    Time 8    Period Weeks    Status Partially Met    Target Date 03/30/20      PT LONG TERM GOAL #4   Title Patient will demo ability to ambulate > 400 ft w/ LRAD versus hand held assist min assist to demonstrate improved household mobility and short community distances.    Baseline TBA, 01/20/20  151f with PT in front of patient, hand held assist, no AD, 03/02/20  pt ambulating with hand held assist with PT in front, patient's gait pattern normalizing, less ataxic 200 ft prior to patient request to sit.    Time 8    Period Weeks    Status Partially Met    Target Date 03/30/20                 Plan - 03/11/20 1450    Clinical Impression Statement The patient demonstrates less impulsivity with sit to stand and stand to walk transitions.  The patient required mod verbal/tactile cueing for exercises and CGA in the clinic for activities.  The patient was able to safely bend  to pick up cones however demonstrates difficulty  holding on to the cones with her UEs.  Coordination in LE movement continues to show improvements.  Patient continues to benefit from additional skilled PT services to improve coordination and balance.    Personal Factors and Comorbidities Comorbidity 1;Behavior Pattern;Transportation    Comorbidities HTN    Examination-Activity Limitations Bed Mobility;Dressing;Hygiene/Grooming;Caring for Others;Stairs;Stand;Toileting;Transfers;Sit;Bathing    Examination-Participation Restrictions Occupation;Medication Management;Cleaning    Stability/Clinical Decision Making Evolving/Moderate complexity    Rehab Potential Good    PT Frequency 2x / week    PT Duration 8 weeks    PT Treatment/Interventions ADLs/Self Care Home Management;Aquatic Therapy;Electrical Stimulation;DME Instruction;Gait training;Stair training;Functional mobility training;Therapeutic activities;Therapeutic exercise;Balance training;Neuromuscular re-education;Wheelchair mobility training;Patient/family education;Orthotic Fit/Training;Manual techniques;Passive range of motion    PT Next Visit Plan FYI- Pt does have feeding tube still. Pt is impulsive. Continue to work on seated and standing balance, BLE strengthening, coordination activities, core strengthening. Gait training with hand held assist of 2 people. Did well with 1 person in front and 1 in back with decreasing support today.    Consulted and Agree with Plan of Care Patient;Family member/caregiver    Family Member Consulted Mom/Dad           Patient will benefit from skilled therapeutic intervention in order to improve the following deficits and impairments:  Abnormal gait,Decreased balance,Decreased endurance,Decreased mobility,Difficulty walking,Impaired tone,Impaired sensation,Pain,Impaired UE functional use,Decreased strength,Decreased safety awareness,Decreased knowledge of use of DME,Decreased coordination,Decreased activity  tolerance,Decreased cognition,Decreased range of motion  Visit Diagnosis: Anoxic brain injury ([place])  Muscle weakness (generalized)  Other lack of coordination  Unsteadiness on feet  Other abnormalities of gait and mobility  Difficulty in walking, not elsewhere classified  Cognitive communication deficit     Problem List Patient Active Problem List   Diagnosis Date Noted  . Oropharyngeal dysphagia 02/12/2020  . Coronary artery disease involving native coronary artery of native heart without angina pectoris 12/12/2019  . Ischemic cardiomyopathy 12/12/2019  . Brain injury ([place])   . Prediabetes   . Anoxic brain injury ([place]) 11/05/2019  . Dysphagia 11/05/2019  . Physical deconditioning 11/05/2019  . Acute delirium 11/05/2019  . Respiratory failure ([place])   . Acute metabolic encephalopathy   . Encounter for central line placement   . Ventricular fibrillation ([place]) 09/23/2019  . Acute combined systolic and diastolic heart failure ([place]) 09/23/2019  . Ventricular tachycardia, polymorphic ([place]) 09/23/2019  . Pelvic pain affecting pregnancy 10/15/2015  . Supervision of normal pregnancy in third trimester 09/28/2015  . Poor weight gain of pregnancy 09/28/2015  . Iron deficiency anemia of pregnancy 09/01/2015  . Increased BMI (body mass index) 07/29/2015    Hal Morales PT, DPT 03/11/2020, 3:00 PM  [place] MAIN [facility] SERVICES [address] [place], [place], [address] Phone: [phone]   Fax:  [phone]  Name: Beverly [name] MRN: [id number] Date of Birth: 27-Apr-1985

## 2020-03-11 NOTE — Therapy (Signed)
[place] MAIN [facility] SERVICES [address] [place], [place], [address] Phone: [phone]   Fax:  [phone]  Speech Language Pathology Treatment  Patient Details  Name: Beverly [name] MRN: [id number] Date of Birth: Aug 19, 1985 Referring Provider (SLP): Dr. Wendee Beavers   Encounter Date: 03/11/2020   End of Session - 03/11/20 1438    Visit Number 16    Number of Visits 25    Date for SLP Re-Evaluation 05/08/20    Authorization Time Period 11 ST visits after 1/1    Authorization - Visit Number 8    Authorization - Number of Visits 11    Progress Note Due on Visit 10    SLP Start Time 1300    SLP Stop Time  6314    SLP Time Calculation (min) 55 min    Activity Tolerance Patient tolerated treatment well           Past Medical History:  Diagnosis Date  . Brain injury ([place])   . CAD (coronary artery disease)   . HLD (hyperlipidemia)   . Hypertension    last pregnancy  . Ischemic cardiomyopathy   . STEMI (ST elevation myocardial infarction) ([place]) 09/2019   SCAD with aneurysmal dilation of proximal LAD.  . Vaginal Pap smear, abnormal    when she was 35yo  . Ventricular fibrillation ([place]) 09/23/2019    Past Surgical History:  Procedure Laterality Date  . IR GASTROSTOMY TUBE MOD SED  10/07/2019  . LEFT HEART CATH AND CORONARY ANGIOGRAPHY N/A 09/23/2019   Procedure: LEFT HEART CATH AND CORONARY ANGIOGRAPHY;  Surgeon: Nelva Bush, MD;  Location: [facility];  Service: Cardiovascular;  Laterality: N/A;    There were no vitals filed for this visit.          ADULT SLP TREATMENT - 03/11/20 1431      General Information   Behavior/Cognition Alert;Cooperative;Distractible;Pleasant mood;Requires cueing;Decreased sustained attention    HPI Pt is a 35 y.o. female with PMH of HTN who was admitted 09/23/19 2 weeks post partum after emergent C section due to rupture of membranes with VF arrest. Pt with enterobacter PNA and  encephalopathy, trach placed 8/30 and PEG on 8/31. MRI 8/31: No evidence of anoxic brain injury. Pt changed to #4 cuffless trach on 9/15; capped on 9/21 and decannulated on 9/25. MBS 9/22, POs started - primary oral dysphagia.   Pt currently using PEG as primary nutrition and hydration and for some meds, due to behavior.      Treatment Provided   Treatment provided Cognitive-Linquistic      Pain Assessment   Pain Assessment No/denies pain      Cognitive-Linquistic Treatment   Treatment focused on Patient/family/caregiver education;Cognition    Skilled Treatment Targeted sustained attention in simple cognitive-linguistic tasks. Word search app, attention to task for 7 minutes (3 redirections), 5 minutes (2 redirections), 4 minutes (0 redirections). Spot difference 4 minutes 70% accuracy mod cues. ID word color 75% accuracy with mod cues for error awareness. Discussed treatment plan with pt/mom as 3 visits remain per Medicaid allowance; will drop frequency to 1x per week for remainder of visits. Resources provided including apps for home practice of attention skills, as well as information on graduate SLP clinics for continued services post-dc ([facility] and [facility]).      Assessment / Recommendations / Plan   Plan Continue with current plan of care      Progression Toward Goals   Progression toward goals Progressing toward  goals            SLP Education - 03/11/20 1437    Education Details [facility], [facility] clinics, apps for cognition    Person(s) Educated Patient;Parent(s)    Methods Explanation;Handout    Comprehension Verbalized understanding            SLP Short Term Goals - 03/09/20 1427      SLP SHORT TERM GOAL #1   Title Beverly [name] will sustain attention for 5 minutes to simple cognitive task with occasional min A over 3 sessions    Time 5    Period Weeks    Status Achieved      SLP SHORT TERM GOAL #2   Title Pt will verbalize 2 safety rules for walking at home with occasional min A over  3 sessions    Baseline able to verbalize with use of memory book    Time 5    Period Weeks    Status Achieved      SLP SHORT TERM GOAL #3   Title Pt will use simple external aid to follow simple schedule and prepare for appointments with usual min A from family    Baseline does not recall or prepare for appointments    Time 5    Period Weeks    Status Partially Met      SLP SHORT TERM GOAL #4   Title Pt will use visual and written word cues to wash her face with usual min verbal cues from mom over 3 sessions    Baseline Per mom, pt only touches her face 2-3 times when asked to wash her face    Time 5    Period Weeks    Status Achieved            SLP Long Term Goals - 03/09/20 1428      SLP LONG TERM GOAL #1   Title Pt will use external aids/to do list to complete 3 simple chores a day with occasional min A over 3 sessions    Time 11    Period Weeks    Status On-going      SLP LONG TERM GOAL #2   Title Pt will sustain attention for 10 minutes to simple cognitive task with 4 or less re-directions over 3 sessions    Baseline sustains attention for 5-10 minutes inconsistently    Time 11    Period Weeks    Status Partially Met      SLP LONG TERM GOAL #3   Title Pt will use visual cues and schedule to eat 4 oz solids and drink 8 oz of liquid with usual min A over 3 sessions    Baseline pt eating less than 1 meal a day and taking only sips (PEG is used)    Time 11    Period Weeks    Status Revised      SLP LONG TERM GOAL #4   Title Pt will use reduced rate to be intellgible in 5 minute conversation with occasional min A    Baseline Rapid rate, rushes of speech, improved intelligibility to ~50% of utterances    Time 11    Period Weeks    Status Partially Met            Plan - 03/11/20 1438    Clinical Impression Statement Beverly [name] maintained sustained attention max 7 minutes today with 3 redirections. Severe cognitive-linguistic deficits persist.  Continue to educate  on functional activities to target Beverly [name]'s  attention/engagement. Continue skilled ST to maximize cognition for improved oral intake, safety, and quality of life.    Speech Therapy Frequency 2x / week    Duration 12 weeks   or 25 visits   Treatment/Interventions Language facilitation;Environmental controls;Cueing hierarchy;SLP instruction and feedback;Aspiration precaution training;Compensatory techniques;Cognitive reorganization;Functional tasks;Compensatory strategies;Diet toleration management by SLP;Trials of upgraded texture/liquids;Internal/external aids;Multimodal communcation approach;Patient/family education    Potential to Achieve Goals Fair    Potential Considerations Severity of impairments;Financial resources           Patient will benefit from skilled therapeutic intervention in order to improve the following deficits and impairments:   Cognitive communication deficit  Anoxic brain injury [facility])    Problem List Patient Active Problem List   Diagnosis Date Noted  . Oropharyngeal dysphagia 02/12/2020  . Coronary artery disease involving native coronary artery of native heart without angina pectoris 12/12/2019  . Ischemic cardiomyopathy 12/12/2019  . Brain injury ([place])   . Prediabetes   . Anoxic brain injury ([place]) 11/05/2019  . Dysphagia 11/05/2019  . Physical deconditioning 11/05/2019  . Acute delirium 11/05/2019  . Respiratory failure ([place])   . Acute metabolic encephalopathy   . Encounter for central line placement   . Ventricular fibrillation ([place]) 09/23/2019  . Acute combined systolic and diastolic heart failure ([place]) 09/23/2019  . Ventricular tachycardia, polymorphic ([place]) 09/23/2019  . Pelvic pain affecting pregnancy 10/15/2015  . Supervision of normal pregnancy in third trimester 09/28/2015  . Poor weight gain of pregnancy 09/28/2015  . Iron deficiency anemia of pregnancy 09/01/2015  . Increased BMI (body mass index) 07/29/2015   Deneise Lever, [place],  [place] E Tamarra Geiselman 03/11/2020, 2:40 PM  [place] MAIN [facility] SERVICES [address] [place], [place], [address] Phone: [phone]   Fax:  [phone]   Name: Beverly [name] MRN: [id number] Date of Birth: [birth year]

## 2020-03-11 NOTE — Patient Instructions (Signed)
[facility]  About the Clinic For more than 45 years, the [facility] has provided comprehensive assessment and treatment services for children and adults in the [facility] community and surrounding areas.  Our clinic offers free individual and group services to people of all ages. Our talented graduate students in the Department of Communication Disorders provide service provisions under the direct supervision of an ASHA clinically certified and [employer] licensed Public relations account executive or audiologist.   Engineer, site We are dedicated to providing comprehensive and evidence-based diagnostic and treatment services. Diagnostic tests are administered at the start of any intervention or rehabilitative service. We treat individuals with a wide range of developmental, acquired and degenerative disorders that affect speech, language, hearing, voice and cognition. Clients work with a therapeutic team to develop goals that meet their specific needs.  We commonly treat people who have:  Autism spectrum disorder Dementias Amyotrophic lateral sclerosis (ALS) Traumatic brain injury Right hemisphere brain damage Aphasia Transgender voice affirmation needs  Treatments may focus on maximizing functional capabilities for:  Articulation Receptive and expressive language Cognition (attention, memory and executive functioning) Hearing Speech and language development Social communication Stuttering Voice Accent expansion preferences Alternative and augmented communication (AAC) Department of Communication Sciences and Disorders    Bilingual Services Las evaluaciones y los servicios son supervisados por un patlogo del habla y Designer, jewellery (SLP) certificado por ASHA y habla espaol con fluidez.  Accessibility The [facility] is accessible for individuals with disabilities, including those  using wheelchairs. The clinic does not discriminate in the delivery of services for any reason, including race, color, sex, age, gender, religion, creed, national origin, sexual orientation, veteran status or disability.  Make an Appointment The clinic staff schedule appointments for screenings, evaluations and therapy sessions. Individuals interested in services or who want to receive more information should call the clinic at [phone] or email [email or web address].  [facility] [employer] Building [address] [place], [place] [address] Fax: [phone]  [email or web address]

## 2020-03-15 ENCOUNTER — Encounter: Payer: Self-pay | Admitting: Family

## 2020-03-15 ENCOUNTER — Telehealth: Payer: Self-pay | Admitting: Licensed Clinical Social Worker

## 2020-03-15 ENCOUNTER — Other Ambulatory Visit: Payer: Self-pay

## 2020-03-15 ENCOUNTER — Ambulatory Visit (INDEPENDENT_AMBULATORY_CARE_PROVIDER_SITE_OTHER): Payer: Medicaid Other | Admitting: Family

## 2020-03-15 VITALS — BP 100/64 | HR 85 | Ht 69.0 in | Wt 166.5 lb

## 2020-03-15 DIAGNOSIS — R5383 Other fatigue: Secondary | ICD-10-CM

## 2020-03-15 DIAGNOSIS — E785 Hyperlipidemia, unspecified: Secondary | ICD-10-CM

## 2020-03-15 DIAGNOSIS — I5022 Chronic systolic (congestive) heart failure: Secondary | ICD-10-CM

## 2020-03-15 DIAGNOSIS — I4901 Ventricular fibrillation: Secondary | ICD-10-CM

## 2020-03-15 DIAGNOSIS — I25118 Atherosclerotic heart disease of native coronary artery with other forms of angina pectoris: Secondary | ICD-10-CM

## 2020-03-15 DIAGNOSIS — G931 Anoxic brain damage, not elsewhere classified: Secondary | ICD-10-CM

## 2020-03-15 DIAGNOSIS — I255 Ischemic cardiomyopathy: Secondary | ICD-10-CM

## 2020-03-15 NOTE — Progress Notes (Signed)
Office Visit    Patient Name: Beverly [name] Date of Encounter: 03/15/2020  Primary Care Provider:  [name] Cardiologist:  Beverly Kendall, MD Electrophysiologist:  None   Chief Complaint    Beverly [name] is a 35 y.o. female with a hx of ventricular fibrillation arrest two weeks postpartum in the setting of aneurysal dilation and spontaneous coronary artery dissection of ostial LAD complicated by anoxic brain injury, ischemic cardiomyopathy, HLD, prediabetes, obesity, HTN, SVT presents today for follow up of CAD  Past Medical History    Past Medical History:  Diagnosis Date  . Brain injury (HCC)   . CAD (coronary artery disease)   . HLD (hyperlipidemia)   . Hypertension    last pregnancy  . Ischemic cardiomyopathy   . STEMI (ST elevation myocardial infarction) (HCC) 09/2019   SCAD with aneurysmal dilation of proximal LAD.  . Vaginal Pap smear, abnormal    when she was 35yo  . Ventricular fibrillation (HCC) 09/23/2019   Past Surgical History:  Procedure Laterality Date  . IR GASTROSTOMY TUBE MOD SED  10/07/2019  . LEFT HEART CATH AND CORONARY ANGIOGRAPHY N/A 09/23/2019   Procedure: LEFT HEART CATH AND CORONARY ANGIOGRAPHY;  Surgeon: Beverly Kendall, MD;  Location: [facility] INVASIVE CV LAB;  Service: Cardiovascular;  Laterality: N/A;    Allergies  No Known Allergies  History of Present Illness    Beverly [name] is a 35 y.o. female with a hx of ventricular fibrillation arrest two weeks postpartum in the setting of aneurysal dilation and spontaneous coronary artery dissection of ostial LAD complicated by anoxic brain injury, ischemic cardiomyopathy, HLD, prediabetes, obesity, HTN, SVT. She was last seen 01/09/2020.  She had a complicated hospitalization lasting 65 days beginning 09/23/19 after presenting the ED with chest pain approximately 2 weeks post partum. She had witnessed cardiac arrest in the ED with ventricular tachycardia  degenerating to ventricular fibrillation. Ultimately, ROSC was achieved after several shocks and rounds of epinephrine. Initial post resucitation EKG with significant anterior ST segment elevated which quickly resolved. Urgent cardiac cath revealed aneurysmal dilation of ostial/prox LAD with apparent area of dissection within the aneurysmal segment. She was transferred to [name] for targeted care including targeted temperature management. Echo 09/24/19 with LVEF 30-35%, wall motion abnormalities, LV mildly dilated, no significant vailvular abnormalities. Prolonged course on ventilator and underwent tracheostomy and gastrostomy tube placement. Tracheostomy was covered by time of discharge on 11/28/19.  She presented for follow up 12/11/19 with her parents present and assisting with history. There were continued difficulties with memory, speech, and agnitation. She was receiving supplemental nutrition through G-tube. She was recommended for repeat echocardiogram for reassessment of LVEF. Echo 12/30/19 with LVEF improved to 50-55%, no wall motion abnormalities, indeterminate diastolic parameters, and trivial MR.   Seen in follow up 01/09/20. As her EF was normalized her Beverly [name] was discontinued and she was started on Beverly [name] 12.5mg  daily due to cost. Her digoxin was discontinued as EF was low normal and due to digoxin's significant side effect profile.   Presents today for follow up with her mom. Her mom shares with me that the have had worsening difficulties with feeding and her weight is down 14 pounds from our clinic visit 2 months ago. Reports no chest pain, pressure, tightness. Denies shortness of breath, dyspnea on exertion. No edema, orthopnea, PND. Not checking BP at home as she does not have a cuff, but interested in having Beverly [name] team send one for home monitoring.  No lightheadedness nor dizziness. Beverly [name]'s mother has a number of questions about short term memory loss, daytime sleepiness, and wonders  whether Beverly [name] might be depressed. She has upcoming appointment with Beverly [name]. All[name] of [facility] and encouraged to discuss with him.   EKGs/Labs/Other Studies Reviewed:   The following studies were reviewed today:  Echo 12/30/19  1. Left ventricular ejection fraction, by estimation, is 50 to 55%. The  left ventricle has low normal function. The left ventricle has no regional  wall motion abnormalities. Left ventricular diastolic parameters are  indeterminate.   2. The mitral valve is grossly normal. Trivial mitral valve  regurgitation.   EKG:  EKG is ordered today.  The ekg ordered today demonstrates NSR 85 bpm with rightward axis and no acute ST/T wave changes. Previous lateral TWI is no longer present.   Recent Labs: 10/15/2019: B Natriuretic Peptide 168.9 11/12/2019: ALT 35 11/23/2019: Hemoglobin 11.0; Magnesium 2.0; Platelets 616; TSH 0.694 12/11/2019: BUN 7; Creatinine, Ser 0.53; Potassium 3.9; Sodium 140  Recent Lipid Panel    Component Value Date/Time   CHOL 220 (H) 09/26/2019 0841   TRIG 115 10/04/2019 0332   HDL 48 09/26/2019 0841   CHOLHDL 4.6 09/26/2019 0841   VLDL 50 (H) 09/26/2019 0841   LDLCALC 122 (H) 09/26/2019 0841    Home Medications   Current Meds  Medication Sig  . acetaminophen (TYLENOL) 160 MG/5ML solution Place 20.3 mLs (650 mg total) into feeding tube every 4 (four) hours as needed for moderate pain, headache or fever.  Marland Kitchen amantadine (SYMMETREL) 50 MG/5ML solution Take 5 mLs (50 mg total) by mouth 2 (two) times daily.  Marland Kitchen aspirin 81 MG chewable tablet Place 1 tablet (81 mg total) into feeding tube daily.  Marland Kitchen atorvastatin (LIPITOR) 80 MG tablet Place 1 tablet (80 mg total) into feeding tube daily.  . carvedilol (COREG) 6.25 MG tablet Place 1 tablet (6.25 mg total) into feeding tube 2 (two) times daily with a meal.  . diphenhydrAMINE (BENADRYL) 25 MG tablet Take 25 mg by mouth every 6 (six) hours as needed.  . feeding supplement (ENSURE  ENLIVE / ENSURE PLUS) LIQD Take 237 mLs by mouth 2 (two) times daily between meals.  Marland Kitchen Beverly [name] (COZAAR) 25 MG tablet Place 1/2 tablet (12.5 mg) into feeding tube once daily  . melatonin 3 MG TABS tablet Place 1 tablet (3 mg total) into feeding tube at bedtime as needed (insomia/nocturnal agitation).  . pantoprazole (PROTONIX) 40 MG tablet Take 1 tablet (40 mg total) by mouth daily.  . Prenatal Vit-Fe Fumarate-FA (PRENATAL MULTIVITAMIN) TABS tablet Place 1 tablet into feeding tube daily at 12 noon.  . Water For Irrigation, Sterile (FREE WATER) SOLN Place 200 mLs into feeding tube every 6 (six) hours.  . [DISCONTINUED] digoxin (LANOXIN) 0.125 MG tablet Take by mouth daily. One tablet into feeding tube  . [DISCONTINUED] spironolactone (ALDACTONE) 25 MG tablet Place 1 tablet (25 mg total) into feeding tube daily.   Current Facility-Administered Medications for the 03/15/20 encounter (Office Visit) with Alver Sorrow, NP  Medication  . iron polysaccharides (NIFEREX) capsule 150 mg    Review of Systems   All other systems reviewed and are otherwise negative except as noted above.  Physical Exam    VS:  BP 100/64 (BP Location: Left Arm, Patient Position: Sitting, Cuff Size: Normal)   Pulse 85   Ht 5\' 9"  (1.753 m)   Wt 166 lb 8 oz (75.5 kg)   SpO2 98%  BMI 24.59 kg/m  , BMI Body mass index is 24.59 kg/m.  Wt Readings from Last 3 Encounters:  03/15/20 166 lb 8 oz (75.5 kg)  02/12/20 171 lb (77.6 kg)  01/09/20 180 lb (81.6 kg)    GEN: Well nourished, well developed, in no acute distress. Tired appearing during our exam and nodding off.  HEENT: normal. Neck: Supple, no JVD, carotid bruits, or masses. Cardiac: RRR, no murmurs, rubs, or gallops. No clubbing, cyanosis, edema.  Radials/PT 2+ and equal bilaterally.  Respiratory:  Respirations regular and unlabored, clear to auscultation bilaterally. GI: Soft, nontender, nondistended. MS: No deformity or atrophy. Skin: Warm and dry, no  rash. Neuro:  Strength and sensation are intact. Alert to person, place, time. Not alert to situation.  Psych: Normal affect.  Assessment & Plan    1. CAD s/p STEMI - Reports no anginal symptoms. EKG today resolution of lateral TWI. Cardiac cath at previous VF arrest in August 2021 with aneurysmal changes to prox LAD and spontaneous dissection in ostial portion LAD. Revascularization not pursued at that time. Angiograms reviewed by Beverly [name]. Okey Dupre with no reasonable targets for percutaneous intervention. Continue GDMT including aspirin, statin, beta blocker.  Future consideration includes screening for fibromuscular dysplasia per Beverly [name]. Okey Dupre due to association with SCAD but recommended continued recovery to minimize stress to Beverly Fidalgo and improve compliance with examinations.   2. Chronic HFrEF due to ischemic cardiomyopathy  With normalization of LVEF - Reduced EF 30-35% following STEMI. Repeat echo 12/30/19 with LVEF 50-55%, no wall motion abnormalities. Continue Beverly [name] 12.5mg  daily. Digoxin previously recommended to be discontinued but her mother did not recall, will discontinue Digoxin today as LVEF now low normal and Digoxin has significant side effect profile. Stop Spironolactone as LVEF now low normal and with persistent weight loss. Anticipate much of her weight loss is due to poor intake though want to prevent dehydration. Continue Coreg.  To allow her to monitor BP at home, will ask our Beverly [name] team to mail a BP cuff.  3. HLD, LDL goal <70 - Started on Atorvastatin 80mg  daily during hospitalization. Prior to initiation, 09/26/19 LDL 122. Lipid panel, direct LDL, CMP today. If LDL not at goal of <70, recommend adding Zetia 10mg  daily.   4. Ventricular fibrillation - In setting of chest pain with immediate ROSC EKG showing anterior ST elevation. Likely precipitated by acute ischemia in setting of ostial LAD CAD. Cath 12/30/19 on date of event with aneurysmal dilation of ostial and prox LAD with possible  ulceration or dissection with TIMI-3 flow throughout LAD. EF now low normal 50-55% reducing risk of recurrent VF. No indication for referral to EP for ICD at this time. Continue Coreg 6.25mg  twice daily.   5. Anoxic brain injury - Though VF in setting of ED with prompt resuscitation, still with significant neurological deficits.  Presently working with PT/OT.Notes continued fatigue, will check CBC to rule out anemia as contributory. Encouraged to discuss with Beverly [name]. at upcoming follow up.    Disposition: Follow up in 3 month(s) with Beverly [name]. 01/01/20 or APP.    Signed, Beverly Katz, NP 03/15/2020, 4:17 PM [facility]

## 2020-03-15 NOTE — Patient Instructions (Signed)
Medication Instructions:  Your physician has recommended you make the following change in your medication:   STOP Digoxin  STOP Spironolactone (Aldactone)  *If you need a refill on your cardiac medications before your next appointment, please call your pharmacy*  Lab Work: Your provider recommends lab work today: lipid panel, direct LDL, CMP, CBC  Testing/Procedures: Your EKG today shows normal sinus rhythm which is a great result!  Follow-Up: At [facility], you and your health needs are our priority.  As part of our continuing mission to provide you with exceptional heart care, we have created designated Provider Care Teams.  These Care Teams include your primary Cardiologist (physician) and Advanced Practice Providers (APPs -  Physician Assistants and Nurse Practitioners) who all work together to provide you with the care you need, when you need it.  We recommend signing up for the patient portal called "MyChart".  Sign up information is provided on this After Visit Summary.  MyChart is used to connect with patients for Virtual Visits (Telemedicine).  Patients are able to view lab/test results, encounter notes, upcoming appointments, etc.  Non-urgent messages can be sent to your provider as well.   To learn more about what you can do with MyChart, go to [email or web address].    Your next appointment:   3 month(s)  The format for your next appointment:   In Person  Provider:   Yvonne Kendall, MD

## 2020-03-15 NOTE — Telephone Encounter (Signed)
CSW referred to assist patient with obtaining a BP cuff. CSW contacted patient to inform cuff will be delivered to home. Patient grateful for support and assistance. CSW available as needed. Jackie Gumaro Brightbill, LCSW, CCSW-MCS [phone]  

## 2020-03-16 ENCOUNTER — Ambulatory Visit: Payer: Medicaid Other

## 2020-03-16 ENCOUNTER — Encounter: Payer: Self-pay | Admitting: Physical Medicine & Rehabilitation

## 2020-03-16 ENCOUNTER — Other Ambulatory Visit: Payer: Self-pay

## 2020-03-16 ENCOUNTER — Ambulatory Visit: Payer: Medicaid Other | Admitting: Speech Pathology

## 2020-03-16 ENCOUNTER — Ambulatory Visit: Payer: Medicaid Other | Admitting: Occupational Therapy

## 2020-03-16 ENCOUNTER — Encounter: Payer: Medicaid Other | Attending: Physical Medicine & Rehabilitation | Admitting: Physical Medicine & Rehabilitation

## 2020-03-16 VITALS — BP 108/71 | HR 81 | Temp 98.0°F | Ht 69.0 in | Wt 166.0 lb

## 2020-03-16 DIAGNOSIS — G931 Anoxic brain damage, not elsewhere classified: Secondary | ICD-10-CM | POA: Insufficient documentation

## 2020-03-16 DIAGNOSIS — G479 Sleep disorder, unspecified: Secondary | ICD-10-CM | POA: Diagnosis not present

## 2020-03-16 DIAGNOSIS — R269 Unspecified abnormalities of gait and mobility: Secondary | ICD-10-CM | POA: Diagnosis not present

## 2020-03-16 DIAGNOSIS — R1312 Dysphagia, oropharyngeal phase: Secondary | ICD-10-CM | POA: Diagnosis present

## 2020-03-16 LAB — COMPREHENSIVE METABOLIC PANEL
ALT: 25 IU/L (ref 0–32)
AST: 21 IU/L (ref 0–40)
Albumin/Globulin Ratio: 1.2 (ref 1.2–2.2)
Albumin: 4.3 g/dL (ref 3.8–4.8)
Alkaline Phosphatase: 132 IU/L — ABNORMAL HIGH (ref 44–121)
BUN/Creatinine Ratio: 14 (ref 9–23)
BUN: 10 mg/dL (ref 6–20)
Bilirubin Total: 0.3 mg/dL (ref 0.0–1.2)
CO2: 26 mmol/L (ref 20–29)
Calcium: 10 mg/dL (ref 8.7–10.2)
Chloride: 99 mmol/L (ref 96–106)
Creatinine, Ser: 0.7 mg/dL (ref 0.57–1.00)
GFR calc Af Amer: 133 mL/min/{1.73_m2} (ref >59)
GFR calc non Af Amer: 115 mL/min/{1.73_m2} (ref >59)
Globulin, Total: 3.5 g/dL (ref 1.5–4.5)
Glucose: 104 mg/dL — ABNORMAL HIGH (ref 65–99)
Potassium: 4.7 mmol/L (ref 3.5–5.2)
Sodium: 136 mmol/L (ref 134–144)
Total Protein: 7.8 g/dL (ref 6.0–8.5)

## 2020-03-16 LAB — CBC
Hematocrit: 38.4 % (ref 34.0–46.6)
Hemoglobin: 12.5 g/dL (ref 11.1–15.9)
MCH: 27.2 pg (ref 26.6–33.0)
MCHC: 32.6 g/dL (ref 31.5–35.7)
MCV: 84 fL (ref 79–97)
Platelets: 537 10*3/uL — ABNORMAL HIGH (ref 150–450)
RBC: 4.59 x10E6/uL (ref 3.77–5.28)
RDW: 15.9 % — ABNORMAL HIGH (ref 11.7–15.4)
WBC: 8.5 10*3/uL (ref 3.4–10.8)

## 2020-03-16 LAB — LIPID PANEL
Chol/HDL Ratio: 2.5 ratio (ref 0.0–4.4)
Cholesterol, Total: 107 mg/dL (ref 100–199)
HDL: 42 mg/dL (ref >39)
LDL Chol Calc (NIH): 41 mg/dL (ref 0–99)
Triglycerides: 137 mg/dL (ref 0–149)
VLDL Cholesterol Cal: 24 mg/dL (ref 5–40)

## 2020-03-16 LAB — LDL CHOLESTEROL, DIRECT: LDL Direct: 48 mg/dL (ref 0–99)

## 2020-03-16 MED ORDER — AMANTADINE HCL 50 MG/5ML PO SOLN: 100.0000 mg | Freq: Two times a day (BID) | ORAL | 1 refills | Status: DC

## 2020-03-16 NOTE — Progress Notes (Signed)
Subjective:    Patient ID: Beverly [name], female    DOB: Jan 20, 1986, 35 y.o.   MRN: [id number]  HPI  Female with pmh of Vfib STEMI, HTN, anoxic BI presents with anoxic BI.   Initially stated: Mother provides history.  Started 09/23/19.  She delivered her 3rd child and had a heart attack 2 weeks later.  She was unresponsive for 4-5 minutes. Memory is biggest deficit.  She was not able to come to CIR due to insurance.  She was in the hospital for 65 days.  She did not have HH for the same reason.  She is outpatient therapies 2/week.  Mother had to quit her job and takes care of patient and her 3 children.  She now has a home health aid 5/week for 3.5 hours/day.  She is losing weight due to pocketing food and forgetting to swallow.  She has a PEG, which is being used.  Incontinent most of the time with bowel/bladder.  Sleep is poor. Currently taking benadryl.  She has fatigue.  She had falls initially when she came home from the hospital, but has been doing better.  She has tightness in arms/legs.  She is on benadryl and melatonin for sleep. Mother does notice improvement, but states other people have noticed improvement. Behavior waxes/wanes.    Last clinic visit 02/12/20.  Since that time, mother states pt is still in SLP/OT/PT. Limited benefit with Amantadine. In general, she is sleeping better. She has not started diary. Denies falls.  Pain Inventory Average Pain 0 Pain Right Now 0 My pain is intermittent, aching and in both legs.  LOCATION OF PAIN  na  BOWEL Number of stools per week: 7 or more. Oral laxative use No  Type of laxative none Enema or suppository use No  History of colostomy No  Incontinent Yes   BLADDER Pads In and out cath, frequency na Able to self cath No  Bladder incontinence Yes  Frequent urination No  Leakage with coughing No  Difficulty starting stream No  Incomplete bladder emptying No    Mobility walk with assistance how many minutes can you walk?  3-5 ability to climb steps?  yes do you drive?  no needs help with transfers Do you have any goals in this area?  yes  Function not employed: date last employed 08/2019 disabled: date disabled 09/23/2019 I need assistance with the following:  feeding, dressing, bathing, toileting, meal prep, household duties and shopping  Neuro/Psych weakness trouble walking spasms confusion anxiety  Prior Studies Any changes since last visit?  no  Physicians involved in your care Any changes since last visit?  no   Family History  Problem Relation Age of Onset  . Hyperlipidemia Mother   . Hypertension Mother   . Diabetes Maternal Grandmother   . Seizures Maternal Grandmother   . Thyroid disease Maternal Grandmother   . Diabetes Paternal Grandmother   . Rashes / Skin problems Son        ezcema  . Seizures Son    Social History   Socioeconomic History  . Marital status: Single    Spouse name: Not on file  . Number of children: 3  . Years of education: Not on file  . Highest education level: Not on file  Occupational History  . Occupation: oncology    Employer: [employer]    Comment: NA  Tobacco Use  . Smoking status: Never Smoker  . Smokeless tobacco: Never Used  Vaping Use  .  Vaping Use: Never used  Substance and Sexual Activity  . Alcohol use: Not Currently    Alcohol/week: 0.0 standard drinks    Comment: quit 2017  . Drug use: No    Frequency: 5.0 times per week    Types: Marijuana    Comment: stopped when she found out she was pregnant  . Sexual activity: Not Currently    Partners: Male    Birth control/protection: None  Other Topics Concern  . Not on file  Social History Narrative   01/06/20 lives with mom and her children   Social Determinants of Health   Financial Resource Strain: Not on file  Food Insecurity: Not on file  Transportation Needs: Not on file  Physical Activity: Not on file  Stress: Not on file  Social Connections: Not on file   Past  Surgical History:  Procedure Laterality Date  . IR GASTROSTOMY TUBE MOD SED  10/07/2019  . LEFT HEART CATH AND CORONARY ANGIOGRAPHY N/A 09/23/2019   Procedure: LEFT HEART CATH AND CORONARY ANGIOGRAPHY;  Surgeon: Yvonne Kendall, MD;  Location: [facility] INVASIVE CV LAB;  Service: Cardiovascular;  Laterality: N/A;   Past Medical History:  Diagnosis Date  . Brain injury (HCC)   . CAD (coronary artery disease)   . HLD (hyperlipidemia)   . Hypertension    last pregnancy  . Ischemic cardiomyopathy   . STEMI (ST elevation myocardial infarction) (HCC) 09/2019   SCAD with aneurysmal dilation of proximal LAD.  . Vaginal Pap smear, abnormal    when she was 35yo  . Ventricular fibrillation (HCC) 09/23/2019   BP 108/71   Pulse 81   Temp 98 F (36.7 C)   Ht 5\' 9"  (1.753 m)   Wt 166 lb (75.3 kg)   SpO2 97%   BMI 24.51 kg/m   Opioid Risk Score:   Fall Risk Score:  `1  Depression screen PHQ 2/9  Depression screen [facility] 2/9 03/16/2020 02/12/2020  Decreased Interest 0 2  Down, Depressed, Hopeless 0 0  PHQ - 2 Score 0 2  Altered sleeping - 2  Tired, decreased energy - 1  Change in appetite - 3  Feeling bad or failure about yourself  - 0  Trouble concentrating - 3  Moving slowly or fidgety/restless - 3  Suicidal thoughts - 0  PHQ-9 Score - 14  Difficult doing work/chores - Extremely dIfficult     Review of Systems  Unable to perform ROS: Other  Brain injury, unchanged     Objective:   Physical Exam  Constitutional: No distress . Vital signs reviewed. HENT: Normocephalic.  Atraumatic. Eyes: EOMI. No discharge. Cardiovascular: No JVD.   Respiratory: Normal effort.  No stridor.   GI: Non-distended.  + PEG. Skin: Warm and dry.  Intact. Psych: Normal mood.  Normal behavior. Musc: No edema in extremities.  No tenderness in extremities. Neuro: Alert and oriented x1. Dysarthria, unchanged Motor: Limited, grossly 4/5 throughout, unchanged No obvious tone noted, however, significant  guarding    Assessment & Plan:  Female with pmh of Vfib STEMI, HTN, anoxic BI presents with anoxic BI  1. Anoxic BI - memory biggest complaint  MRI/CTs unremarkable  Labs reviewed  Chart/Referral information reviewed - Anoxic BI  PMAWARE reviewed  UDS performed  Continue speech therapy  Continue PT  Continue OT  Provide environmental management by reducing the level of stimulation, tolerating restlessness when possible, protecting patient from harming self or others and reducing patient's cognitive confusion.  Address behavioral concerns include providing structured  environments and daily routines.  Cognitive therapy to direct modular abilities in order to maintain goals  including problem solving, self regulation/monitoring, self management, attention, and memory.  Avoid medications that could impair cognitive abilities, such as anticholinergics, antihistaminic, benzodiazapines, narcotics, etc when possible  Will increase Amantadine to 100mg  with breakfast and lunch for fatigue  Will consider Ritalin  Will consider SSRI vs mood stablizer  Encouraged diary for good/bad days, reminded.  2. Dysphagia  Cont PEG feeds  Limited by cognition with pocketing food   Remove distractions  Supplement as necessary  Continue oral intake as tolerated  3. Gait abnormality  Continue ambulation with assistance   3. Sleep disturbance  Melatonin 10mg  daily (vs prn)  Avoid benadryl  Educated on sleep hygiene and routine  Improving

## 2020-03-18 ENCOUNTER — Other Ambulatory Visit: Payer: Self-pay

## 2020-03-18 ENCOUNTER — Ambulatory Visit: Payer: Medicaid Other | Admitting: Speech Pathology

## 2020-03-18 ENCOUNTER — Ambulatory Visit: Payer: Medicaid Other

## 2020-03-18 ENCOUNTER — Encounter: Payer: Medicaid Other | Admitting: Occupational Therapy

## 2020-03-18 DIAGNOSIS — G931 Anoxic brain damage, not elsewhere classified: Secondary | ICD-10-CM

## 2020-03-18 DIAGNOSIS — R41841 Cognitive communication deficit: Secondary | ICD-10-CM | POA: Diagnosis not present

## 2020-03-18 DIAGNOSIS — R278 Other lack of coordination: Secondary | ICD-10-CM

## 2020-03-18 DIAGNOSIS — R2689 Other abnormalities of gait and mobility: Secondary | ICD-10-CM

## 2020-03-18 DIAGNOSIS — M6281 Muscle weakness (generalized): Secondary | ICD-10-CM

## 2020-03-18 DIAGNOSIS — R262 Difficulty in walking, not elsewhere classified: Secondary | ICD-10-CM

## 2020-03-18 DIAGNOSIS — R2681 Unsteadiness on feet: Secondary | ICD-10-CM

## 2020-03-18 NOTE — Therapy (Signed)
[place] MAIN [facility] SERVICES [address] [place], [place], [address] Phone: [phone]   Fax:  [phone]   Physical Therapy Progress Note   Dates of reporting period 03/02/20  to  03/18/20      Patient Details  Name: Beverly [name] MRN: [id number] Date of Birth: 1985-04-07 Referring Provider (PT): Mercy Riding, MD   Encounter Date: 03/18/2020   PT End of Session - 03/18/20 1638    Visit Number 20    Number of Visits 25    PT Start Time 0320    PT Stop Time 0400    PT Time Calculation (min) 40 min    Equipment Utilized During Treatment Gait belt    Activity Tolerance Patient tolerated treatment well    Behavior During Therapy WFL for tasks assessed/performed           Past Medical History:  Diagnosis Date  . Brain injury ([place])   . CAD (coronary artery disease)   . HLD (hyperlipidemia)   . Hypertension    last pregnancy  . Ischemic cardiomyopathy   . STEMI (ST elevation myocardial infarction) ([place]) 09/2019   SCAD with aneurysmal dilation of proximal LAD.  . Vaginal Pap smear, abnormal    when she was 35yo  . Ventricular fibrillation ([place]) 09/23/2019    Past Surgical History:  Procedure Laterality Date  . IR GASTROSTOMY TUBE MOD SED  10/07/2019  . LEFT HEART CATH AND CORONARY ANGIOGRAPHY N/A 09/23/2019   Procedure: LEFT HEART CATH AND CORONARY ANGIOGRAPHY;  Surgeon: Nelva Bush, MD;  Location: [place] CV LAB;  Service: Cardiovascular;  Laterality: N/A;    There were no vitals filed for this visit.   Subjective Assessment - 03/18/20 1636    Subjective Patient reports that she is doing good today.    Patient is accompained by: Family member   mom   Pertinent History HTN    Limitations Sitting;Standing;Walking;House hold activities    Patient Stated Goals walk better    Currently in Pain? No/denies    Pain Score 0-No pain               Session spent outside  Patient ambulating with CGA in  front of patient with PT holding gait belt.  Patient ambulating indicating direction to walk including up and down gradual ramps   Placing and picking up cones around walking area- difficulty with manipulation of cones                       PT Short Term Goals - 03/18/20 1638      PT SHORT TERM GOAL #1   Title Patient/caregiver will report independence with HEP with caregiver assistance (All STGs Due: 01/07/2020)    Baseline reports independence; only completing 1-2x/week per mom reports 01/20/20  patient's caregiver assisting patient with HEP, 03/02/20  pt doing exercises at home regularly, 03/18/20  pt walking at home with supervision, doing exercises.    Time 4    Period Weeks    Status Partially Met    Target Date 03/30/20      PT SHORT TERM GOAL #2   Title Patient will demo ability to complete transfer from w/c <> mat with CGA with LRAD to demonstrate improved functional mobility    Baseline intermittent CGA- Min A, 01/20/20  continues with intermittent need to transfer from WC<> mat no AD CGA, min assist or close supervision at home with transfers without use of  AD.    Time 4    Period Weeks    Status Partially Met    Target Date 03/30/20      PT SHORT TERM GOAL #3   Title Patient will demo ability to ambulate >/= 200 ft  with LRAD or hand hand assist with moderate assistance to demonstrate improved functional mobility in home.    Baseline 115 ft max assist +2 hand held assist., 01/20/20  pt ambulating 175 ft with PT hand had assist in front of patient, no AD.  03/02/20  200 ft with PT hand held assist, min A    Time 4    Period Weeks    Status Achieved    Target Date 03/30/20             PT Long Term Goals - 03/18/20 1639      PT LONG TERM GOAL #1   Title Patient/caregiver will report indepence with final HEP with caregiver assistance (All LTGS: 02/04/2020)    Baseline no HEP established, 01/20/20  caregiver assisting PT with home program    Time 8     Period Weeks    Status On-going    Target Date 03/30/20      PT LONG TERM GOAL #2   Title Patient will demo ability to complete all bed mobility Mod I to demonstrate improved independence    Baseline CGA/Supervision, 01/20/20 supervision, intermitten mod I to perform bed mobility, 03/02/20 supervision with bed mobility by caregiver min assist on occasion, 03/18/20  same as previous update    Time 8    Period Weeks    Status Partially Met    Target Date 03/30/20      PT LONG TERM GOAL #3   Title Patient will demo ability to complete sit <> stand x 5 reps with supervision to demonstrate improved balance and functional mobility    Baseline CGA - Min A, 01/20/20  intermittent min A to perform sit to stand, sits from a standing position. CGA for safety, 03/02/20  pt hesitant to stand alone preferred hand held assist from PT though patient has been able to demonstrate independence with this in previous visits, 03/18/20 pt able to perform sit to stands with less impulsivity.  Patient using PT cueing for standing.  Patient can stand without assistance and has been able to demonstrate this in the clinic.  Intermittently prefers min CGA to transition.    Time 8    Period Weeks    Status Partially Met    Target Date 03/30/20      PT LONG TERM GOAL #4   Title Patient will demo ability to ambulate > 400 ft w/ LRAD versus hand held assist min assist to demonstrate improved household mobility and short community distances.    Baseline TBA, 01/20/20  189f with PT in front of patient, hand held assist, no AD, 03/02/20  pt ambulating with hand held assist with PT in front, patient's gait pattern normalizing, less ataxic 200 ft prior to patient request to sit. 03/18/20  pt able to ambule with hand heald assist greater than 400 ft.  The patient occasionally requires sit breaks due to leg fatigue.  Patient pattern continues to be less ataxic.    Time 8    Period Weeks    Status Partially Met    Target Date 03/30/20                   Plan - 03/18/20 1658  Clinical Impression Statement The patient tolerated treatment well with improved awareness of her limitations.  Patient aware of when she needed to stop walking whether due to fatigue or when she would have visible shaking.  The patient also speaking more intelligibly throughout session  Patient able to ambulate distances up and down gradual ramps and around obstacles.  The patient continues to make progress overall.  Patient's condition has the potential to improve in response to therapy. Maximum improvement is yet to be obtained. The anticipated improvement is attainable and reasonable in a generally predictable time.    Personal Factors and Comorbidities Comorbidity 1;Behavior Pattern;Transportation    Comorbidities HTN    Examination-Activity Limitations Bed Mobility;Dressing;Hygiene/Grooming;Caring for Others;Stairs;Stand;Toileting;Transfers;Sit;Bathing    Examination-Participation Restrictions Occupation;Medication Management;Cleaning    Stability/Clinical Decision Making Evolving/Moderate complexity    Rehab Potential Good    PT Frequency 2x / week    PT Duration 8 weeks    PT Treatment/Interventions ADLs/Self Care Home Management;Aquatic Therapy;Electrical Stimulation;DME Instruction;Gait training;Stair training;Functional mobility training;Therapeutic activities;Therapeutic exercise;Balance training;Neuromuscular re-education;Wheelchair mobility training;Patient/family education;Orthotic Fit/Training;Manual techniques;Passive range of motion    PT Next Visit Plan FYI- Pt does have feeding tube still. Pt is impulsive. Continue to work on seated and standing balance, BLE strengthening, coordination activities, core strengthening. Gait training with hand held assist of 2 people. Did well with 1 person in front and 1 in back with decreasing support today.    Consulted and Agree with Plan of Care Patient;Family member/caregiver    Family Member  Consulted Mom/Dad           Patient will benefit from skilled therapeutic intervention in order to improve the following deficits and impairments:  Abnormal gait,Decreased balance,Decreased endurance,Decreased mobility,Difficulty walking,Impaired tone,Impaired sensation,Pain,Impaired UE functional use,Decreased strength,Decreased safety awareness,Decreased knowledge of use of DME,Decreased coordination,Decreased activity tolerance,Decreased cognition,Decreased range of motion  Visit Diagnosis: Anoxic brain injury ([place])  Muscle weakness (generalized)  Other lack of coordination  Unsteadiness on feet  Other abnormalities of gait and mobility  Difficulty in walking, not elsewhere classified  Cognitive communication deficit     Problem List Patient Active Problem List   Diagnosis Date Noted  . Abnormality of gait 03/16/2020  . Sleep disturbance 03/16/2020  . Oropharyngeal dysphagia 02/12/2020  . Coronary artery disease involving native coronary artery of native heart without angina pectoris 12/12/2019  . Ischemic cardiomyopathy 12/12/2019  . Brain injury ([place])   . Prediabetes   . Anoxic brain injury ([place]) 11/05/2019  . Dysphagia 11/05/2019  . Physical deconditioning 11/05/2019  . Acute delirium 11/05/2019  . Respiratory failure ([place])   . Acute metabolic encephalopathy   . Encounter for central line placement   . Ventricular fibrillation ([place]) 09/23/2019  . Acute combined systolic and diastolic heart failure ([place]) 09/23/2019  . Ventricular tachycardia, polymorphic ([place]) 09/23/2019  . Pelvic pain affecting pregnancy 10/15/2015  . Supervision of normal pregnancy in third trimester 09/28/2015  . Poor weight gain of pregnancy 09/28/2015  . Iron deficiency anemia of pregnancy 09/01/2015  . Increased BMI (body mass index) 07/29/2015    Hal Morales PT, DPT 03/18/2020, 5:03 PM  [place] MAIN [facility] SERVICES [address]  [place], [place], [address] Phone: [phone]   Fax:  [phone]  Name: Beverly [name] MRN: [id number] Date of Birth: Sep 27, 1985

## 2020-03-23 ENCOUNTER — Ambulatory Visit: Payer: Medicaid Other | Admitting: Speech Pathology

## 2020-03-23 ENCOUNTER — Encounter: Payer: Self-pay | Admitting: Occupational Therapy

## 2020-03-23 ENCOUNTER — Ambulatory Visit: Payer: Medicaid Other

## 2020-03-23 ENCOUNTER — Ambulatory Visit: Payer: Medicaid Other | Admitting: Occupational Therapy

## 2020-03-23 ENCOUNTER — Other Ambulatory Visit: Payer: Self-pay

## 2020-03-23 DIAGNOSIS — G931 Anoxic brain damage, not elsewhere classified: Secondary | ICD-10-CM

## 2020-03-23 DIAGNOSIS — R41841 Cognitive communication deficit: Secondary | ICD-10-CM | POA: Diagnosis not present

## 2020-03-23 DIAGNOSIS — M6281 Muscle weakness (generalized): Secondary | ICD-10-CM

## 2020-03-23 DIAGNOSIS — R278 Other lack of coordination: Secondary | ICD-10-CM

## 2020-03-23 NOTE — Therapy (Addendum)
[place] [facility] MAIN [facility] SERVICES [address] [place], [place], [address] Phone: [phone]   Fax:  [phone]  Occupational Therapy Treatment/Recertification Note  Patient Details  Name: Beverly [name] MRN: [id number] Date of Birth: 1985/09/18 Referring Provider (OT): Candelaria Stagers MD   Encounter Date: 03/23/2020   OT End of Session - 03/23/20 1642    Visit Number 13    Number of Visits 24    Date for OT Re-Evaluation 06/08/20    Authorization Type Medicaid OON    OT Start Time 1400    OT Stop Time 1445    OT Time Calculation (min) 45 min    Activity Tolerance Patient tolerated treatment well    Behavior During Therapy [facility] for tasks assessed/performed           Past Medical History:  Diagnosis Date  . Brain injury ([facility])   . CAD (coronary artery disease)   . HLD (hyperlipidemia)   . Hypertension    last pregnancy  . Ischemic cardiomyopathy   . STEMI (ST elevation myocardial infarction) ([facility]) 09/2019   SCAD with aneurysmal dilation of proximal LAD.  . Vaginal Pap smear, abnormal    when she was 35yo  . Ventricular fibrillation ([facility]) 09/23/2019    Past Surgical History:  Procedure Laterality Date  . IR GASTROSTOMY TUBE MOD SED  10/07/2019  . LEFT HEART CATH AND CORONARY ANGIOGRAPHY N/A 09/23/2019   Procedure: LEFT HEART CATH AND CORONARY ANGIOGRAPHY;  Surgeon: Yvonne Kendall, MD;  Location: [facility] INVASIVE CV LAB;  Service: Cardiovascular;  Laterality: N/A;    There were no vitals filed for this visit.   Subjective Assessment - 03/23/20 1641    Subjective  Pt. was present with her mother. Pt.'s mother requested a referral to the [facility].    Patient is accompanied by: Family member    Pertinent History On 09/23/19 patient presented to ED 2 weeks postpartum (had emergent C-Section) with chest pain. In ED sustained VFib Cardiac Arrest requiring intubation. EKG showed ST elevation, Cath Lab showed aneurysmal  dissection of the LAD was identified. Course complicated by encephalopathy, anoxic brain injury, enterobacterial PNAPMH: HTN    Currently in Pain? No/denies          OT Treatment   Self-care:   Pt. worked on Environmental consultant the letters of her name with her dominant left hand. Pt. required the use of a wide pen. Pt. required less cues, and assist to reposition the the pen in her hand initiating more with her left hand, and using her right hand to assist as needed. Pt. Worked on circling  Items from a list of types of flowers, and colors. Pt. worked on formulating 4 letter words.   Pt. Has progressed with attention, however continues to require cues for redirection. Pt. Has progressed with bilateral hand coordination skills moving objects from one hand to another. Pt. presents with impaired bilateral UE motor control, and coordination skills. Pt. requires assist for stabilization distally during tabletop coordination tasks. Pt. Required assist to reposition a large handled adaptive pen in her left hand during writing. Pt. Required consistent cues to formulate the individual letters of each word slowly. Legibility was 25%. Pt. formed the letters large, with impaired spacing and positive deviation below the line. Pt.  Pt. Continues to  have difficulty completing O's. Pt. Continues to work on improving UE strength, and [facility] skills in order to work towards improving, and maximizing independence with ADLs, and  IADLs.                          OT Education - 03/23/20 1642    Education Details FMC, bilateral hand coordination    Person(s) Educated Patient;Parent(s)    Methods Explanation    Comprehension Need further instruction            OT Short Term Goals - 02/17/20 1503      OT SHORT TERM GOAL #1   Title Pt/ caregiver will be I with inital HEP.    Baseline Independent    Status Achieved      OT SHORT TERM GOAL #2   Period Weeks      OT SHORT TERM GOAL  #3   Baseline 1 min with re-direction      OT SHORT TERM GOAL #4   Title Pt will wash her face and brush her teeth with no more than min v.c for thoroughness    Baseline Independent with verbal cues, and set-up.    Time 6    Period Weeks    Status On-going      OT SHORT TERM GOAL #6   Title Pt will demonstrate ability to sequence a simple functional or ADL task with no more than min v.c    Baseline Pt requires grossly min-modA with sequencing simple activities    Period Weeks    Status On-going                  OT LONG TERM GOAL #1   Title Pt will perform LB bathing and dressing with no more than min A.    Baseline ModA    Time 12    Period Weeks    Status On-going    Target Date 06/08/20     OT LONG TERM GOAL #2   Title Pt will demonstrate improved bilateral UE functional use by increasing box/ blocks score by 6 blocks from inital measurement.    Baseline RUE 10 blocks, LUE 12 blocks    Time 12    Status Deferred      OT LONG TERM GOAL #3   Title Pt will perform simple cold snack prep with min A.    Baseline dependent    Time 12    Period Weeks    Status On-going    Target Date 06/08/20      OT LONG TERM GOAL #4   Title Pt will attend to an ADL/ simple functional familiar task for 10 mins.    Baseline attends 3 min. Before requiring cues   Time 12    Period Weeks    Status On-going      OT LONG TERM GOAL #5   Title Pt perform a simple home management task with no more than min A, demonstrating good safety awareness.    Baseline dependent    Time 12    Period Weeks    Status On-going    Target Date 06/08/2020     OT LONG TERM GOAL #6   Title Pt will write her name with 75% legibility    Baseline Pt. is able to formulate several letters of her name.    Time 13    Period Weeks    Status On-going    Target Date 06/08/2020                Plan - 03/23/20 1643    Clinical Impression Statement Pt. presents with  impaired bilateral UE motor  control, and coordination skills. Pt. requires assist for stabilization distally during tabletop coordination tasks. Pt. Required assist to reposition a large handled adaptive pen in her left hand during writing. Pt. Required consistent cues to formulate the individual letters of each word slowly. Legibility was 25%. Pt. formed the letters large, with impaired spacing and positive deviation below the line. Pt.  Pt. Continues to  have difficulty completing O's. Pt. Continues to work on improving UE strength, and [facility] skills in order to work towards improving, and maximizing independence with ADLs, and IADLs.   OT Occupational Profile and History Detailed Assessment- Review of Records and additional review of physical, cognitive, psychosocial history related to current functional performance    Occupational performance deficits (Please refer to evaluation for details): ADL's;IADL's;Work;Leisure;Social Participation    Body Structure / Function / Physical Skills ADL;Endurance;UE functional use;Balance;Pain;Flexibility;FMC;ROM;GMC;Coordination;Decreased knowledge of precautions;Decreased knowledge of use of DME;IADL;Dexterity;Strength;Mobility;Tone;Vision;Sensation;Gait    Cognitive Skills Attention;Energy/Drive;Memory;Orientation;Problem Solve;Safety Awareness;Thought;Understand    Rehab Potential Good    Clinical Decision Making Several treatment options, min-mod task modification necessary    Comorbidities Affecting Occupational Performance: May have comorbidities impacting occupational performance    Modification or Assistance to Complete Evaluation  Min-Moderate modification of tasks or assist with assess necessary to complete eval    OT Frequency 2x / week    OT Duration 12 weeks    OT Treatment/Interventions Self-care/ADL training;Ultrasound;Visual/perceptual remediation/compensation;Patient/family education;[place] mobilization;Aquatic Therapy;Paraffin;Passive range of motion;Balance training;Dry  needling;Stair Training;Fluidtherapy;Cryotherapy;Splinting;Moist Heat;Manual Therapy;Therapeutic exercise;Therapeutic activities;Functional Mobility Training;Neuromuscular education;Cognitive remediation/compensation    Plan ADL strategies, sitting balance and trunk control    Consulted and Agree with Plan of Care Patient;Family member/caregiver    Family Member Consulted Mother           Patient will benefit from skilled therapeutic intervention in order to improve the following deficits and impairments:   Body Structure / Function / Physical Skills: ADL,Endurance,UE functional use,Balance,Pain,Flexibility,FMC,ROM,GMC,Coordination,Decreased knowledge of precautions,Decreased knowledge of use of DME,IADL,Dexterity,Strength,Mobility,Tone,Vision,Sensation,Gait Cognitive Skills: Attention,Energy/Drive,Memory,Orientation,Problem Solve,Safety Awareness,Thought,Understand     Visit Diagnosis: Muscle weakness (generalized)  Other lack of coordination    Problem List Patient Active Problem List   Diagnosis Date Noted  . Abnormality of gait 03/16/2020  . Sleep disturbance 03/16/2020  . Oropharyngeal dysphagia 02/12/2020  . Coronary artery disease involving native coronary artery of native heart without angina pectoris 12/12/2019  . Ischemic cardiomyopathy 12/12/2019  . Brain injury ([facility])   . Prediabetes   . Anoxic brain injury ([facility]) 11/05/2019  . Dysphagia 11/05/2019  . Physical deconditioning 11/05/2019  . Acute delirium 11/05/2019  . Respiratory failure ([facility])   . Acute metabolic encephalopathy   . Encounter for central line placement   . Ventricular fibrillation ([facility]) 09/23/2019  . Acute combined systolic and diastolic heart failure ([facility]) 09/23/2019  . Ventricular tachycardia, polymorphic ([facility]) 09/23/2019  . Pelvic pain affecting pregnancy 10/15/2015  . Supervision of normal pregnancy in third trimester 09/28/2015  . Poor weight gain of pregnancy 09/28/2015  . Iron deficiency  anemia of pregnancy 09/01/2015  . Increased BMI (body mass index) 07/29/2015    Olegario Messier, MS ,OTR/L 03/23/2020, 4:50 PM  [place] [facility] MAIN [facility] SERVICES [address] [place], [place], [address] Phone: [phone]   Fax:  [phone]  Name: Beverly [name] MRN: [id number] Date of Birth: 06-May-1985

## 2020-03-23 NOTE — Therapy (Signed)
[place] MAIN [facility] SERVICES [address] [place], [place], [address] Phone: [phone]   Fax:  [phone]  Speech Language Pathology Treatment  Patient Details  Name: Beverly [name] MRN: [id number] Date of Birth: Feb 22, 1985 Referring Provider (SLP): Dr. Wendee Beavers   Encounter Date: 03/23/2020   End of Session - 03/23/20 1302    Visit Number 17    Number of Visits 25    Date for SLP Re-Evaluation 05/08/20    Authorization Type awaiting medicaid    Authorization Time Period 11 ST visits after 1/1    Authorization - Visit Number 9    Authorization - Number of Visits 11    Progress Note Due on Visit 10    SLP Start Time 1300    SLP Stop Time  9628    SLP Time Calculation (min) 55 min    Activity Tolerance Patient tolerated treatment well           Past Medical History:  Diagnosis Date  . Brain injury ([place])   . CAD (coronary artery disease)   . HLD (hyperlipidemia)   . Hypertension    last pregnancy  . Ischemic cardiomyopathy   . STEMI (ST elevation myocardial infarction) ([place]) 09/2019   SCAD with aneurysmal dilation of proximal LAD.  . Vaginal Pap smear, abnormal    when she was 35yo  . Ventricular fibrillation ([place]) 09/23/2019    Past Surgical History:  Procedure Laterality Date  . IR GASTROSTOMY TUBE MOD SED  10/07/2019  . LEFT HEART CATH AND CORONARY ANGIOGRAPHY N/A 09/23/2019   Procedure: LEFT HEART CATH AND CORONARY ANGIOGRAPHY;  Surgeon: Nelva Bush, MD;  Location: [facility];  Service: Cardiovascular;  Laterality: N/A;    There were no vitals filed for this visit.   Subjective Assessment - 03/23/20 1525    Subjective Mom states pt more alert during the day since adjusting timing/dosage of amantidine per Dr. Posey Pronto    Patient is accompained by: Family member   [name]   Currently in Pain? No/denies                 ADULT SLP TREATMENT - 03/23/20 1528      General Information    Behavior/Cognition Alert;Cooperative;Distractible;Pleasant mood;Requires cueing;Decreased sustained attention    HPI Pt is a 35 y.o. female with PMH of HTN who was admitted 09/23/19 2 weeks post partum after emergent C section due to rupture of membranes with VF arrest. Pt with enterobacter PNA and encephalopathy, trach placed 8/30 and PEG on 8/31. MRI 8/31: No evidence of anoxic brain injury. Pt changed to #4 cuffless trach on 9/15; capped on 9/21 and decannulated on 9/25. MBS 9/22, POs started - primary oral dysphagia.   Pt currently using PEG as primary nutrition and hydration and for some meds, due to behavior.      Treatment Provided   Treatment provided Cognitive-Linquistic      Pain Assessment   Pain Assessment No/denies pain      Cognitive-Linquistic Treatment   Treatment focused on Patient/family/caregiver education;Cognition    Skilled Treatment Mom now giving Amantidine at breakfast and lunch instead of am and pm, reports pt has been more alert during the day. She has seen improvements with abilities to take PO; Beverly [name] had 2 bites of chicken pie and a cup of broth yesterday; did not expectorate per mom. Targeted sustained attention with simple cognitive-linguistic tasks (repeating sequences, following directions, matching, name recall, pattern recall). Patient remained  engaged throughout session. Engaged in each task for 7-10 minutes, with mod cues necessary for impulsivity and task redirection due to internal or min environmental distractions (advertisement with app). Accuracy for 1-2 components ~85%, with increased complexity accuracy deteriorates to ~60%.      Assessment / Recommendations / Plan   Plan Continue with current plan of care      Progression Toward Goals   Progression toward goals Progressing toward goals            SLP Education - 03/23/20 1543    Education Details apps for cognition    Person(s) Educated Patient;Parent(s)    Methods Explanation;Demonstration     Comprehension Verbalized understanding;Need further instruction            SLP Short Term Goals - 03/09/20 1427      SLP SHORT TERM GOAL #1   Title Beverly [name] will sustain attention for 5 minutes to simple cognitive task with occasional min A over 3 sessions    Time 5    Period Weeks    Status Achieved      SLP SHORT TERM GOAL #2   Title Pt will verbalize 2 safety rules for walking at home with occasional min A over 3 sessions    Baseline able to verbalize with use of memory book    Time 5    Period Weeks    Status Achieved      SLP SHORT TERM GOAL #3   Title Pt will use simple external aid to follow simple schedule and prepare for appointments with usual min A from family    Baseline does not recall or prepare for appointments    Time 5    Period Weeks    Status Partially Met      SLP SHORT TERM GOAL #4   Title Pt will use visual and written word cues to wash her face with usual min verbal cues from mom over 3 sessions    Baseline Per mom, pt only touches her face 2-3 times when asked to wash her face    Time 5    Period Weeks    Status Achieved            SLP Long Term Goals - 03/09/20 1428      SLP LONG TERM GOAL #1   Title Pt will use external aids/to do list to complete 3 simple chores a day with occasional min A over 3 sessions    Time 11    Period Weeks    Status On-going      SLP LONG TERM GOAL #2   Title Pt will sustain attention for 10 minutes to simple cognitive task with 4 or less re-directions over 3 sessions    Baseline sustains attention for 5-10 minutes inconsistently    Time 11    Period Weeks    Status Partially Met      SLP LONG TERM GOAL #3   Title Pt will use visual cues and schedule to eat 4 oz solids and drink 8 oz of liquid with usual min A over 3 sessions    Baseline pt eating less than 1 meal a day and taking only sips (PEG is used)    Time 11    Period Weeks    Status Revised      SLP LONG TERM GOAL #4   Title Pt will use reduced  rate to be intellgible in 5 minute conversation with occasional min A  Baseline Rapid rate, rushes of speech, improved intelligibility to ~50% of utterances    Time 11    Period Weeks    Status Partially Met            Plan - 03/23/20 1544    Clinical Impression Statement Beverly [name] maintained sustained attention 7-10 minutes with moderate cues. Severe cognitive-linguistic deficits persist. Mother reports improved ability to sustain attention for POs at home with medication changes. Continue to educate on functional activities to target Beverly [name]'s attention/engagement. Continue skilled ST to maximize cognition for improved oral intake, safety, and quality of life.    Speech Therapy Frequency 2x / week    Duration 12 weeks   or 25 visits   Treatment/Interventions Language facilitation;Environmental controls;Cueing hierarchy;SLP instruction and feedback;Aspiration precaution training;Compensatory techniques;Cognitive reorganization;Functional tasks;Compensatory strategies;Diet toleration management by SLP;Trials of upgraded texture/liquids;Internal/external aids;Multimodal communcation approach;Patient/family education    Potential to Achieve Goals Fair    Potential Considerations Severity of impairments;Financial resources           Patient will benefit from skilled therapeutic intervention in order to improve the following deficits and impairments:   Cognitive communication deficit  Anoxic brain injury [facility])    Problem List Patient Active Problem List   Diagnosis Date Noted  . Abnormality of gait 03/16/2020  . Sleep disturbance 03/16/2020  . Oropharyngeal dysphagia 02/12/2020  . Coronary artery disease involving native coronary artery of native heart without angina pectoris 12/12/2019  . Ischemic cardiomyopathy 12/12/2019  . Brain injury ([place])   . Prediabetes   . Anoxic brain injury ([place]) 11/05/2019  . Dysphagia 11/05/2019  . Physical deconditioning 11/05/2019  . Acute  delirium 11/05/2019  . Respiratory failure ([place])   . Acute metabolic encephalopathy   . Encounter for central line placement   . Ventricular fibrillation ([place]) 09/23/2019  . Acute combined systolic and diastolic heart failure ([place]) 09/23/2019  . Ventricular tachycardia, polymorphic ([place]) 09/23/2019  . Pelvic pain affecting pregnancy 10/15/2015  . Supervision of normal pregnancy in third trimester 09/28/2015  . Poor weight gain of pregnancy 09/28/2015  . Iron deficiency anemia of pregnancy 09/01/2015  . Increased BMI (body mass index) 07/29/2015   Deneise Lever, [place], CCC-SLP Speech-Language Pathologist  Aliene Altes 03/23/2020, 3:46 PM  [place] MAIN [facility] SERVICES [address] [place], [place], [address] Phone: [phone]   Fax:  [phone]   Name: Beverly [name] MRN: [id number] Date of Birth: 1985/10/09

## 2020-03-25 ENCOUNTER — Encounter: Payer: Medicaid Other | Admitting: Speech Pathology

## 2020-03-25 ENCOUNTER — Ambulatory Visit: Payer: Medicaid Other

## 2020-03-25 ENCOUNTER — Other Ambulatory Visit: Payer: Self-pay

## 2020-03-25 ENCOUNTER — Encounter: Payer: Medicaid Other | Admitting: Occupational Therapy

## 2020-03-25 DIAGNOSIS — R41841 Cognitive communication deficit: Secondary | ICD-10-CM | POA: Diagnosis not present

## 2020-03-25 DIAGNOSIS — R2681 Unsteadiness on feet: Secondary | ICD-10-CM

## 2020-03-25 DIAGNOSIS — R262 Difficulty in walking, not elsewhere classified: Secondary | ICD-10-CM

## 2020-03-25 DIAGNOSIS — R2689 Other abnormalities of gait and mobility: Secondary | ICD-10-CM

## 2020-03-25 DIAGNOSIS — R278 Other lack of coordination: Secondary | ICD-10-CM

## 2020-03-25 DIAGNOSIS — M6281 Muscle weakness (generalized): Secondary | ICD-10-CM

## 2020-03-25 DIAGNOSIS — G931 Anoxic brain damage, not elsewhere classified: Secondary | ICD-10-CM

## 2020-03-25 NOTE — Therapy (Signed)
[place] MAIN [facility] SERVICES [address] [place], [place], [address] Phone: [phone]   Fax:  [phone]  Physical Therapy Treatment  Patient Details  Name: Beverly [name] MRN: [id number] Date of Birth: 06/02/1985 Referring Provider (PT): Mercy Riding, MD   Encounter Date: 03/25/2020   PT End of Session - 03/25/20 1640    Visit Number 21    Number of Visits 25    PT Start Time 0315    PT Stop Time 0400    PT Time Calculation (min) 45 min    Equipment Utilized During Treatment Gait belt    Activity Tolerance Patient tolerated treatment well    Behavior During Therapy WFL for tasks assessed/performed           Past Medical History:  Diagnosis Date  . Brain injury ([place])   . CAD (coronary artery disease)   . HLD (hyperlipidemia)   . Hypertension    last pregnancy  . Ischemic cardiomyopathy   . STEMI (ST elevation myocardial infarction) ([place]) 09/2019   SCAD with aneurysmal dilation of proximal LAD.  . Vaginal Pap smear, abnormal    when she was 35yo  . Ventricular fibrillation ([place]) 09/23/2019    Past Surgical History:  Procedure Laterality Date  . IR GASTROSTOMY TUBE MOD SED  10/07/2019  . LEFT HEART CATH AND CORONARY ANGIOGRAPHY N/A 09/23/2019   Procedure: LEFT HEART CATH AND CORONARY ANGIOGRAPHY;  Surgeon: Nelva Bush, MD;  Location: [facility];  Service: Cardiovascular;  Laterality: N/A;    There were no vitals filed for this visit.   Subjective Assessment - 03/25/20 1639    Subjective The patient reports she is doing fine.    Patient is accompained by: Family member   mom   Pertinent History HTN    Limitations Sitting;Standing;Walking;House hold activities    Patient Stated Goals walk better    Currently in Pain? No/denies    Pain Score 0-No pain               TREATMENT  Min CGA provided throughout session, mod cueing for exercise technique and to focus on tasks  Nu step x3 mins, cueing  for coordination Picking up cones x 3 reps Hedge hog toe taps on colored hedge hog that PT called out x several repetitions Ambulating around cones x 3 laps Ambulating carrying basketball around clinic Ambulating down hall and up elevator to valet  Seated Basketball bounce pass x several repetitions Basketball throw x several repetitions                         PT Short Term Goals - 03/18/20 1638      PT SHORT TERM GOAL #1   Title Patient/caregiver will report independence with HEP with caregiver assistance (All STGs Due: 01/07/2020)    Baseline reports independence; only completing 1-2x/week per mom reports 01/20/20  patient's caregiver assisting patient with HEP, 03/02/20  pt doing exercises at home regularly, 03/18/20  pt walking at home with supervision, doing exercises.    Time 4    Period Weeks    Status Partially Met    Target Date 03/30/20      PT SHORT TERM GOAL #2   Title Patient will demo ability to complete transfer from w/c <> mat with CGA with LRAD to demonstrate improved functional mobility    Baseline intermittent CGA- Min A, 01/20/20  continues with intermittent need to transfer from  WC<> mat no AD CGA, min assist or close supervision at home with transfers without use of AD.    Time 4    Period Weeks    Status Partially Met    Target Date 03/30/20      PT SHORT TERM GOAL #3   Title Patient will demo ability to ambulate >/= 200 ft  with LRAD or hand hand assist with moderate assistance to demonstrate improved functional mobility in home.    Baseline 115 ft max assist +2 hand held assist., 01/20/20  pt ambulating 175 ft with PT hand had assist in front of patient, no AD.  03/02/20  200 ft with PT hand held assist, min A    Time 4    Period Weeks    Status Achieved    Target Date 03/30/20             PT Long Term Goals - 03/18/20 1639      PT LONG TERM GOAL #1   Title Patient/caregiver will report indepence with final HEP with caregiver  assistance (All LTGS: 02/04/2020)    Baseline no HEP established, 01/20/20  caregiver assisting PT with home program    Time 8    Period Weeks    Status On-going    Target Date 03/30/20      PT LONG TERM GOAL #2   Title Patient will demo ability to complete all bed mobility Mod I to demonstrate improved independence    Baseline CGA/Supervision, 01/20/20 supervision, intermitten mod I to perform bed mobility, 03/02/20 supervision with bed mobility by caregiver min assist on occasion, 03/18/20  same as previous update    Time 8    Period Weeks    Status Partially Met    Target Date 03/30/20      PT LONG TERM GOAL #3   Title Patient will demo ability to complete sit <> stand x 5 reps with supervision to demonstrate improved balance and functional mobility    Baseline CGA - Min A, 01/20/20  intermittent min A to perform sit to stand, sits from a standing position. CGA for safety, 03/02/20  pt hesitant to stand alone preferred hand held assist from PT though patient has been able to demonstrate independence with this in previous visits, 03/18/20 pt able to perform sit to stands with less impulsivity.  Patient using PT cueing for standing.  Patient can stand without assistance and has been able to demonstrate this in the clinic.  Intermittently prefers min CGA to transition.    Time 8    Period Weeks    Status Partially Met    Target Date 03/30/20      PT LONG TERM GOAL #4   Title Patient will demo ability to ambulate > 400 ft w/ LRAD versus hand held assist min assist to demonstrate improved household mobility and short community distances.    Baseline TBA, 01/20/20  158f with PT in front of patient, hand held assist, no AD, 03/02/20  pt ambulating with hand held assist with PT in front, patient's gait pattern normalizing, less ataxic 200 ft prior to patient request to sit. 03/18/20  pt able to ambule with hand heald assist greater than 400 ft.  The patient occasionally requires sit breaks due to leg  fatigue.  Patient pattern continues to be less ataxic.    Time 8    Period Weeks    Status Partially Met    Target Date 03/30/20  Plan - 03/25/20 1641    Clinical Impression Statement The patient more distracted this session with cueing to focus on tasks.  Patient continues to request PT CGA in front when in unfamiliar locations or if there are people walking nearby.  Mod cueing verbal and tactile for exercise techniques.The patient continues to benefit from additional skilled PT services to improve coordination, motor control and gait for improved quality of life.    Personal Factors and Comorbidities Comorbidity 1;Behavior Pattern;Transportation    Comorbidities HTN    Examination-Activity Limitations Bed Mobility;Dressing;Hygiene/Grooming;Caring for Others;Stairs;Stand;Toileting;Transfers;Sit;Bathing    Examination-Participation Restrictions Occupation;Medication Management;Cleaning    Stability/Clinical Decision Making Evolving/Moderate complexity    Rehab Potential Good    PT Frequency 2x / week    PT Duration 8 weeks    PT Treatment/Interventions ADLs/Self Care Home Management;Aquatic Therapy;Electrical Stimulation;DME Instruction;Gait training;Stair training;Functional mobility training;Therapeutic activities;Therapeutic exercise;Balance training;Neuromuscular re-education;Wheelchair mobility training;Patient/family education;Orthotic Fit/Training;Manual techniques;Passive range of motion    PT Next Visit Plan FYI- Pt does have feeding tube still. Pt is impulsive. Continue to work on seated and standing balance, BLE strengthening, coordination activities, core strengthening. Gait training with hand held assist of 2 people. Did well with 1 person in front and 1 in back with decreasing support today.    Consulted and Agree with Plan of Care Patient;Family member/caregiver    Family Member Consulted Mom/Dad           Patient will benefit from skilled therapeutic  intervention in order to improve the following deficits and impairments:  Abnormal gait,Decreased balance,Decreased endurance,Decreased mobility,Difficulty walking,Impaired tone,Impaired sensation,Pain,Impaired UE functional use,Decreased strength,Decreased safety awareness,Decreased knowledge of use of DME,Decreased coordination,Decreased activity tolerance,Decreased cognition,Decreased range of motion  Visit Diagnosis: Muscle weakness (generalized)  Other lack of coordination  Cognitive communication deficit  Anoxic brain injury ([place])  Unsteadiness on feet  Other abnormalities of gait and mobility  Difficulty in walking, not elsewhere classified     Problem List Patient Active Problem List   Diagnosis Date Noted  . Abnormality of gait 03/16/2020  . Sleep disturbance 03/16/2020  . Oropharyngeal dysphagia 02/12/2020  . Coronary artery disease involving native coronary artery of native heart without angina pectoris 12/12/2019  . Ischemic cardiomyopathy 12/12/2019  . Brain injury ([place])   . Prediabetes   . Anoxic brain injury ([place]) 11/05/2019  . Dysphagia 11/05/2019  . Physical deconditioning 11/05/2019  . Acute delirium 11/05/2019  . Respiratory failure ([place])   . Acute metabolic encephalopathy   . Encounter for central line placement   . Ventricular fibrillation ([place]) 09/23/2019  . Acute combined systolic and diastolic heart failure ([place]) 09/23/2019  . Ventricular tachycardia, polymorphic ([place]) 09/23/2019  . Pelvic pain affecting pregnancy 10/15/2015  . Supervision of normal pregnancy in third trimester 09/28/2015  . Poor weight gain of pregnancy 09/28/2015  . Iron deficiency anemia of pregnancy 09/01/2015  . Increased BMI (body mass index) 07/29/2015    Hal Morales PT, DPT 03/25/2020, 4:55 PM  [place] MAIN [facility] SERVICES [address] [place], [place], [address] Phone: [phone]   Fax:  [phone]  Name: Beverly [name] MRN: [id number] Date of Birth: [birth year]

## 2020-03-26 ENCOUNTER — Other Ambulatory Visit: Payer: Self-pay | Admitting: Physical Medicine & Rehabilitation

## 2020-03-30 ENCOUNTER — Encounter: Payer: Self-pay | Admitting: Occupational Therapy

## 2020-03-30 ENCOUNTER — Other Ambulatory Visit: Payer: Self-pay

## 2020-03-30 ENCOUNTER — Ambulatory Visit: Payer: Medicaid Other

## 2020-03-30 ENCOUNTER — Ambulatory Visit: Payer: Medicaid Other | Admitting: Occupational Therapy

## 2020-03-30 ENCOUNTER — Ambulatory Visit: Payer: Medicaid Other | Admitting: Speech Pathology

## 2020-03-30 DIAGNOSIS — R41841 Cognitive communication deficit: Secondary | ICD-10-CM | POA: Diagnosis not present

## 2020-03-30 DIAGNOSIS — Z0271 Encounter for disability determination: Secondary | ICD-10-CM

## 2020-03-30 DIAGNOSIS — G931 Anoxic brain damage, not elsewhere classified: Secondary | ICD-10-CM

## 2020-03-30 DIAGNOSIS — M6281 Muscle weakness (generalized): Secondary | ICD-10-CM

## 2020-03-30 DIAGNOSIS — R278 Other lack of coordination: Secondary | ICD-10-CM

## 2020-03-30 NOTE — Therapy (Signed)
[place] [facility] MAIN [facility] SERVICES [address] [place], [place], [address] Phone: [phone]   Fax:  [phone]  Occupational Therapy Treatment  Patient Details  Name: Beverly [name] MRN: [id number] Date of Birth: [birth year] Referring Provider (OT): Candelaria Stagers MD   Encounter Date: 03/30/2020   OT End of Session - 03/30/20 1510    Visit Number 14    Number of Visits 24    Date for OT Re-Evaluation 06/08/20    OT Start Time 1400    OT Stop Time 1445    OT Time Calculation (min) 45 min    Activity Tolerance Patient tolerated treatment well    Behavior During Therapy [facility] for tasks assessed/performed           Past Medical History:  Diagnosis Date  . Brain injury (HCC)   . CAD (coronary artery disease)   . HLD (hyperlipidemia)   . Hypertension    last pregnancy  . Ischemic cardiomyopathy   . STEMI (ST elevation myocardial infarction) (HCC) 09/2019   SCAD with aneurysmal dilation of proximal LAD.  . Vaginal Pap smear, abnormal    when she was 35yo  . Ventricular fibrillation (HCC) 09/23/2019    Past Surgical History:  Procedure Laterality Date  . IR GASTROSTOMY TUBE MOD SED  10/07/2019  . LEFT HEART CATH AND CORONARY ANGIOGRAPHY N/A 09/23/2019   Procedure: LEFT HEART CATH AND CORONARY ANGIOGRAPHY;  Surgeon: Yvonne Kendall, MD;  Location: [facility] INVASIVE CV LAB;  Service: Cardiovascular;  Laterality: N/A;    There were no vitals filed for this visit.   Subjective Assessment - 03/30/20 1509    Subjective  Pt. was present with her mother.    Patient is accompanied by: Family member    Pertinent History On 09/23/19 patient presented to ED 2 weeks postpartum (had emergent C-Section) with chest pain. In ED sustained VFib Cardiac Arrest requiring intubation. EKG showed ST elevation, Cath Lab showed aneurysmal dissection of the LAD was identified. Course complicated by encephalopathy, anoxic brain injury, enterobacterial PNAPMH: HTN     Currently in Pain? No/denies            OT TREATMENT    Neuro muscular re-education:  Pt. worked on grasping, and flipping 1/2" letter squares with her right, and left hands. Pt. Worked on isolating her 2nd digit to create words including her name, and foods that she likes to eat.    Therapeutic Exercise:  Pt. worked on pinch strengthening in the left hand for lateral, and 3pt. pinch using yellow, red, green, and blue resistive clips. Pt. worked on placing the clips on a horizontal dowel. Tactile and verbal cues were required for eliciting the desired movement.  Pt. requires verbal reminders at the beginning of each session about OT, tasks performed at the last lession, and general progress as the pt. Does not remember from session to session. Pt. requires consistent cues for movement patterns, and formulating pinch movements. Pt. required consistent cues for redirection, as pt. consistently looks to her mother for reassurance. Pt. Continues to work on improving UE strength, and [facility] skills in order to work towards bilateral hand function, coordination, and overall ADLs, and IADLs                      OT Education - 03/30/20 1509    Education Details FMC, bilateral hand coordination    Person(s) Educated Patient;Parent(s)    Methods Explanation    Comprehension Need further  instruction            OT Short Term Goals - 02/17/20 1503      OT SHORT TERM GOAL #1   Title Pt/ caregiver will be I with inital HEP.    Baseline Independent    Status Achieved      OT SHORT TERM GOAL #2   Period Weeks      OT SHORT TERM GOAL #3   Baseline 1 min with re-direction      OT SHORT TERM GOAL #4   Title Pt will wash her face and brush her teeth with no more than min v.c for thoroughness    Baseline Independent with verbal cues, and set-up.    Time 6    Period Weeks    Status On-going      OT SHORT TERM GOAL #6   Title Pt will demonstrate ability to sequence a simple  functional or ADL task with no more than min v.c    Baseline Pt requires grossly min-modA with sequencing simple activities    Period Weeks    Status On-going             OT Long Term Goals - 03/30/20 1800      OT LONG TERM GOAL #1   Title Pt will perform LB bathing and dressing with no more than min A.    Baseline ModA    Time 12    Period Weeks    Status On-going    Target Date 06/08/20      OT LONG TERM GOAL #2   Title Pt will demonstrate improved bilateral UE functional use by increasing box/ blocks score by 6 blocks from inital measurement.    Baseline RUE 10 blocks, LUE 12 blocks    Time 12    Period Weeks    Status Deferred      OT LONG TERM GOAL #3   Title Pt will perform simple cold snack prep with min A.    Baseline dependent    Time 12    Period Weeks    Status On-going    Target Date 06/08/20      OT LONG TERM GOAL #4   Title Pt will attend to an ADL/ simple functional familiar task for 10 mins.    Baseline attends for grossly 3 min before cues    Time 12    Period Weeks    Status On-going    Target Date 06/08/20      OT LONG TERM GOAL #5   Title Pt perform a simple home management task with no more than min A, demonstrating good safety awareness.    Baseline dependent    Time 12    Period Weeks    Status On-going    Target Date 06/08/20      OT LONG TERM GOAL #6   Title Pt will write her name with 75% legibility    Baseline Pt. is able to formulate several letters of her name.    Time 13    Period Weeks    Status On-going    Target Date 06/08/20                 Plan - 03/30/20 1521    Clinical Impression Statement Pt. requires verbal reminders at the beginning of each session about OT, tasks performed at the last lession, and general progress as the pt. Does not remember from session to session. Pt. requires consistent cues for movement  patterns, and formulating pinch movements. Pt. required consistent cues for redirection, as pt.  consistently looks to her mother for reassurance. Pt. Continues to work on improving UE strength, and [facility] skills in order to work towards bilateral hand function, coordination, and overall ADLs, and IADLs    OT Occupational Profile and History Detailed Assessment- Review of Records and additional review of physical, cognitive, psychosocial history related to current functional performance    Occupational performance deficits (Please refer to evaluation for details): ADL's;IADL's;Work;Leisure;Social Participation    Body Structure / Function / Physical Skills ADL;Endurance;UE functional use;Balance;Pain;Flexibility;FMC;ROM;GMC;Coordination;Decreased knowledge of precautions;Decreased knowledge of use of DME;IADL;Dexterity;Strength;Mobility;Tone;Vision;Sensation;Gait    Rehab Potential Good    Clinical Decision Making Several treatment options, min-mod task modification necessary    Comorbidities Affecting Occupational Performance: May have comorbidities impacting occupational performance    Modification or Assistance to Complete Evaluation  Min-Moderate modification of tasks or assist with assess necessary to complete eval    OT Frequency 2x / week    OT Duration 12 weeks    OT Treatment/Interventions Self-care/ADL training;Ultrasound;Visual/perceptual remediation/compensation;Patient/family education;Scar mobilization;Aquatic Therapy;Paraffin;Passive range of motion;Balance training;Dry needling;Stair Training;Fluidtherapy;Cryotherapy;Splinting;Moist Heat;Manual Therapy;Therapeutic exercise;Therapeutic activities;Functional Mobility Training;Neuromuscular education;Cognitive remediation/compensation    Consulted and Agree with Plan of Care Patient;Family member/caregiver           Patient will benefit from skilled therapeutic intervention in order to improve the following deficits and impairments:   Body Structure / Function / Physical Skills: ADL,Endurance,UE functional  use,Balance,Pain,Flexibility,FMC,ROM,GMC,Coordination,Decreased knowledge of precautions,Decreased knowledge of use of DME,IADL,Dexterity,Strength,Mobility,Tone,Vision,Sensation,Gait       Visit Diagnosis: Muscle weakness (generalized)  Other lack of coordination    Problem List Patient Active Problem List   Diagnosis Date Noted  . Abnormality of gait 03/16/2020  . Sleep disturbance 03/16/2020  . Oropharyngeal dysphagia 02/12/2020  . Coronary artery disease involving native coronary artery of native heart without angina pectoris 12/12/2019  . Ischemic cardiomyopathy 12/12/2019  . Brain injury (HCC)   . Prediabetes   . Anoxic brain injury (HCC) 11/05/2019  . Dysphagia 11/05/2019  . Physical deconditioning 11/05/2019  . Acute delirium 11/05/2019  . Respiratory failure (HCC)   . Acute metabolic encephalopathy   . Encounter for central line placement   . Ventricular fibrillation (HCC) 09/23/2019  . Acute combined systolic and diastolic heart failure (HCC) 09/23/2019  . Ventricular tachycardia, polymorphic (HCC) 09/23/2019  . Pelvic pain affecting pregnancy 10/15/2015  . Supervision of normal pregnancy in third trimester 09/28/2015  . Poor weight gain of pregnancy 09/28/2015  . Iron deficiency anemia of pregnancy 09/01/2015  . Increased BMI (body mass index) 07/29/2015    Olegario Messier, MS, OTR/L 03/30/2020, 6:15 PM  [place] [facility] MAIN [facility] SERVICES [address] [place], [place], [address] Phone: [phone]   Fax:  [phone]  Name: Beverly [name] MRN: [id number] Date of Birth: December 11, 1985

## 2020-03-30 NOTE — Therapy (Signed)
[place] MAIN [facility] SERVICES [address] [place], [place], [address] Phone: [phone]   Fax:  [phone]  Speech Language Pathology Treatment  Patient Details  Name: Beverly [name] MRN: [id number] Date of Birth: 28-Jul-1985 Referring Provider (SLP): Dr. Wendee Beavers   Encounter Date: 03/30/2020   End of Session - 03/30/20 1738    Visit Number 18    Number of Visits 25    Date for SLP Re-Evaluation 05/08/20    Authorization Time Period 11 ST visits after 1/1    Authorization - Visit Number 10    Authorization - Number of Visits 11    Progress Note Due on Visit 10    SLP Start Time 1300    SLP Stop Time  1400    SLP Time Calculation (min) 60 min    Activity Tolerance Patient tolerated treatment well           Past Medical History:  Diagnosis Date  . Brain injury ([place])   . CAD (coronary artery disease)   . HLD (hyperlipidemia)   . Hypertension    last pregnancy  . Ischemic cardiomyopathy   . STEMI (ST elevation myocardial infarction) ([place]) 09/2019   SCAD with aneurysmal dilation of proximal LAD.  . Vaginal Pap smear, abnormal    when she was 35yo  . Ventricular fibrillation ([place]) 09/23/2019    Past Surgical History:  Procedure Laterality Date  . IR GASTROSTOMY TUBE MOD SED  10/07/2019  . LEFT HEART CATH AND CORONARY ANGIOGRAPHY N/A 09/23/2019   Procedure: LEFT HEART CATH AND CORONARY ANGIOGRAPHY;  Surgeon: Nelva Bush, MD;  Location: [place] CV LAB;  Service: Cardiovascular;  Laterality: N/A;    There were no vitals filed for this visit.   Subjective Assessment - 03/30/20 1729    Subjective Mom brought memory book today    Patient is accompained by: Family member   Mom [name]   Currently in Pain? No/denies                 ADULT SLP TREATMENT - 03/30/20 1732      General Information   Behavior/Cognition Alert;Cooperative;Distractible;Pleasant mood;Requires cueing;Decreased sustained attention     HPI Pt is a 35 y.o. female with PMH of HTN who was admitted 09/23/19 2 weeks post partum after emergent C section due to rupture of membranes with VF arrest. Pt with enterobacter PNA and encephalopathy, trach placed 8/30 and PEG on 8/31. MRI 8/31: No evidence of anoxic brain injury. Pt changed to #4 cuffless trach on 9/15; capped on 9/21 and decannulated on 9/25. MBS 9/22, POs started - primary oral dysphagia.   Pt currently using PEG as primary nutrition and hydration and for some meds, due to behavior.      Treatment Provided   Treatment provided Cognitive-Linquistic      Pain Assessment   Pain Assessment No/denies pain      Cognitive-Linquistic Treatment   Treatment focused on Patient/family/caregiver education;Cognition    Skilled Treatment Mom reports some improvements with taking POs, although this remains inconsistent. Since last session, she has eaten a whole orange, a slice of pizza, a burger, several platefuls of potato chips. Targeted attention in simple and functional cognitive linguistic tasks. Pt read paragraphs with usual mod cues to slow rate, answered comprehension questions 70% accuracy with occasional cues for impulsivity. Pt read aloud her memory book, with SLP using audio recording to give pt feedback of rapid rate. Jolea was successful in maintaining a slower  rate for 1-2 sentences after receiving this feedback, before additional cues needed. She answered simple personal questions re: work and her children's birthdays with use of memory book, 90% accuracy. Assisted with downloading memory and attention games to pt's phone; she maintained attention to word search for 8 minutes with occasional mod cues.      Assessment / Recommendations / Plan   Plan Continue with current plan of care      Progression Toward Goals   Progression toward goals Progressing toward goals            SLP Education - 03/30/20 1737    Education Details mom cuing pt appropriately for recall; continue  this!    Person(s) Educated Patient;Parent(s)    Methods Explanation    Comprehension Verbalized understanding            SLP Short Term Goals - 03/09/20 1427      SLP SHORT TERM GOAL #1   Title [place] will sustain attention for 5 minutes to simple cognitive task with occasional min A over 3 sessions    Time 5    Period Weeks    Status Achieved      SLP SHORT TERM GOAL #2   Title Pt will verbalize 2 safety rules for walking at home with occasional min A over 3 sessions    Baseline able to verbalize with use of memory book    Time 5    Period Weeks    Status Achieved      SLP SHORT TERM GOAL #3   Title Pt will use simple external aid to follow simple schedule and prepare for appointments with usual min A from family    Baseline does not recall or prepare for appointments    Time 5    Period Weeks    Status Partially Met      SLP SHORT TERM GOAL #4   Title Pt will use visual and written word cues to wash her face with usual min verbal cues from mom over 3 sessions    Baseline Per mom, pt only touches her face 2-3 times when asked to wash her face    Time 5    Period Weeks    Status Achieved            SLP Long Term Goals - 03/09/20 1428      SLP LONG TERM GOAL #1   Title Pt will use external aids/to do list to complete 3 simple chores a day with occasional min A over 3 sessions    Time 11    Period Weeks    Status On-going      SLP LONG TERM GOAL #2   Title Pt will sustain attention for 10 minutes to simple cognitive task with 4 or less re-directions over 3 sessions    Baseline sustains attention for 5-10 minutes inconsistently    Time 11    Period Weeks    Status Partially Met      SLP LONG TERM GOAL #3   Title Pt will use visual cues and schedule to eat 4 oz solids and drink 8 oz of liquid with usual min A over 3 sessions    Baseline pt eating less than 1 meal a day and taking only sips (PEG is used)    Time 11    Period Weeks    Status Revised       SLP LONG TERM GOAL #4   Title Pt will use reduced  rate to be intellgible in 5 minute conversation with occasional min A    Baseline Rapid rate, rushes of speech, improved intelligibility to ~50% of utterances    Time 11    Period Weeks    Status Partially Met            Plan - 03/30/20 1738    Clinical Impression Statement Trevia maintained sustained attention 8-10 minutes with occasional moderate cues. Severe cognitive-linguistic deficits persist. Tereza has made some gains in ability to sustain attention to POs at home, although success fluctuates day to day. Continue to educate on functional activities to target Jaloni's attention/engagement. Continue skilled ST to maximize cognition for improved oral intake, safety, and quality of life. Plan is for d/c next visit due to Medicaid visits exhausted; pt would continue to benefit from [place] and mother is pursuing this through local universities ([employer], [place]).    Speech Therapy Frequency 2x / week    Duration 12 weeks   or 25 visits   Treatment/Interventions Language facilitation;Environmental controls;Cueing hierarchy;SLP instruction and feedback;[place]iration precaution training;Compensatory techniques;Cognitive reorganization;Functional tasks;Compensatory strategies;Diet toleration management by SLP;Trials of upgraded texture/liquids;Internal/external aids;Multimodal communcation approach;Patient/family education    Potential to Achieve Goals Fair    Potential Considerations Severity of impairments;Financial resources           Patient will benefit from skilled therapeutic intervention in order to improve the following deficits and impairments:   Cognitive communication deficit  Anoxic brain injury [facility])    Problem List Patient Active Problem List   Diagnosis Date Noted  . Abnormality of gait 03/16/2020  . Sleep disturbance 03/16/2020  . Oropharyngeal dysphagia 02/12/2020  . Coronary artery disease involving native coronary  artery of native heart without angina pectoris 12/12/2019  . Ischemic cardiomyopathy 12/12/2019  . Brain injury ([place])   . Prediabetes   . Anoxic brain injury ([place]) 11/05/2019  . Dysphagia 11/05/2019  . Physical deconditioning 11/05/2019  . Acute delirium 11/05/2019  . Respiratory failure ([place])   . Acute metabolic encephalopathy   . Encounter for central line placement   . Ventricular fibrillation ([place]) 09/23/2019  . Acute combined systolic and diastolic heart failure ([place]) 09/23/2019  . Ventricular tachycardia, polymorphic ([place]) 09/23/2019  . Pelvic pain affecting pregnancy 10/15/2015  . Supervision of normal pregnancy in third trimester 09/28/2015  . Poor weight gain of pregnancy 09/28/2015  . Iron deficiency anemia of pregnancy 09/01/2015  . Increased BMI (body mass index) 07/29/2015   Deneise Lever, [place], CCC-SLP Speech-Language Pathologist  Aliene Altes 03/30/2020, 5:41 PM  [place] MAIN [facility] SERVICES [address] [place], [place], [address] Phone: [phone]   Fax:  [phone]   Name: Beverly [name] MRN: [id number] Date of Birth: [birth year]

## 2020-03-30 NOTE — Addendum Note (Signed)
Addended by: Avon Gully on: 03/30/2020 06:12 PM   Modules accepted: Orders

## 2020-04-01 ENCOUNTER — Encounter: Payer: Medicaid Other | Admitting: Speech Pathology

## 2020-04-01 ENCOUNTER — Encounter: Payer: Medicaid Other | Admitting: Occupational Therapy

## 2020-04-01 ENCOUNTER — Other Ambulatory Visit: Payer: Self-pay

## 2020-04-01 ENCOUNTER — Ambulatory Visit: Payer: Medicaid Other

## 2020-04-01 DIAGNOSIS — R41841 Cognitive communication deficit: Secondary | ICD-10-CM

## 2020-04-01 DIAGNOSIS — R262 Difficulty in walking, not elsewhere classified: Secondary | ICD-10-CM

## 2020-04-01 DIAGNOSIS — R278 Other lack of coordination: Secondary | ICD-10-CM

## 2020-04-01 DIAGNOSIS — M6281 Muscle weakness (generalized): Secondary | ICD-10-CM

## 2020-04-01 DIAGNOSIS — R2681 Unsteadiness on feet: Secondary | ICD-10-CM

## 2020-04-01 DIAGNOSIS — R2689 Other abnormalities of gait and mobility: Secondary | ICD-10-CM

## 2020-04-01 NOTE — Therapy (Signed)
[place] MAIN [facility] SERVICES [address] [place], [place], [address] Phone: [phone]   Fax:  [phone]  Physical Therapy Treatment  Patient Details  Name: Beverly [name] MRN: [id number] Date of Birth: May 17, 1985 Referring Provider (PT): Mercy Riding, MD   Encounter Date: 04/01/2020   PT End of Session - 04/01/20 1705    Visit Number 22    Number of Visits 25    PT Start Time 0315    PT Stop Time 0358    PT Time Calculation (min) 43 min    Equipment Utilized During Treatment Gait belt    Activity Tolerance Patient tolerated treatment well    Behavior During Therapy [facility] for tasks assessed/performed           Past Medical History:  Diagnosis Date  . Brain injury ([place])   . CAD (coronary artery disease)   . HLD (hyperlipidemia)   . Hypertension    last pregnancy  . Ischemic cardiomyopathy   . STEMI (ST elevation myocardial infarction) ([place]) 09/2019   SCAD with aneurysmal dilation of proximal LAD.  . Vaginal Pap smear, abnormal    when she was 35yo  . Ventricular fibrillation ([place]) 09/23/2019    Past Surgical History:  Procedure Laterality Date  . IR GASTROSTOMY TUBE MOD SED  10/07/2019  . LEFT HEART CATH AND CORONARY ANGIOGRAPHY N/A 09/23/2019   Procedure: LEFT HEART CATH AND CORONARY ANGIOGRAPHY;  Surgeon: Nelva Bush, MD;  Location: [place] [facility];  Service: Cardiovascular;  Laterality: N/A;    There were no vitals filed for this visit.   Subjective Assessment - 04/01/20 1703    Subjective Patient states she is doing good today.    Patient is accompained by: Family member   mom   Pertinent History HTN    Limitations Sitting;Standing;Walking;House hold activities    Patient Stated Goals walk better    Currently in Pain? No/denies            min CGA with activities  Small ball pick up and carry to basketball hoop 30 ft away, multiple reps Basketball catch multiple reps Basketball bounce pass,  multiple reps with demonstration and VC for sequencing Soccer ball kicks, alternating legs seated and standing multiple resps Airex pad normal stance hitting balloon reaching at different angles, multiple reps                         PT Short Term Goals - 03/18/20 1638      PT SHORT TERM GOAL #1   Title Patient/caregiver will report independence with HEP with caregiver assistance (All STGs Due: 01/07/2020)    Baseline reports independence; only completing 1-2x/week per mom reports 01/20/20  patient's caregiver assisting patient with HEP, 03/02/20  pt doing exercises at home regularly, 03/18/20  pt walking at home with supervision, doing exercises.    Time 4    Period Weeks    Status Partially Met    Target Date 03/30/20      PT SHORT TERM GOAL #2   Title Patient will demo ability to complete transfer from w/c <> mat with CGA with LRAD to demonstrate improved functional mobility    Baseline intermittent CGA- Min A, 01/20/20  continues with intermittent need to transfer from WC<> mat no AD CGA, min assist or close supervision at home with transfers without use of AD.    Time 4    Period Weeks    Status  Partially Met    Target Date 03/30/20      PT SHORT TERM GOAL #3   Title Patient will demo ability to ambulate >/= 200 ft  with LRAD or hand hand assist with moderate assistance to demonstrate improved functional mobility in home.    Baseline 115 ft max assist +2 hand held assist., 01/20/20  pt ambulating 175 ft with PT hand had assist in front of patient, no AD.  03/02/20  200 ft with PT hand held assist, min A    Time 4    Period Weeks    Status Achieved    Target Date 03/30/20             PT Long Term Goals - 03/18/20 1639      PT LONG TERM GOAL #1   Title Patient/caregiver will report indepence with final HEP with caregiver assistance (All LTGS: 02/04/2020)    Baseline no HEP established, 01/20/20  caregiver assisting PT with home program    Time 8    Period  Weeks    Status On-going    Target Date 03/30/20      PT LONG TERM GOAL #2   Title Patient will demo ability to complete all bed mobility Mod I to demonstrate improved independence    Baseline CGA/Supervision, 01/20/20 supervision, intermitten mod I to perform bed mobility, 03/02/20 supervision with bed mobility by caregiver min assist on occasion, 03/18/20  same as previous update    Time 8    Period Weeks    Status Partially Met    Target Date 03/30/20      PT LONG TERM GOAL #3   Title Patient will demo ability to complete sit <> stand x 5 reps with supervision to demonstrate improved balance and functional mobility    Baseline CGA - Min A, 01/20/20  intermittent min A to perform sit to stand, sits from a standing position. CGA for safety, 03/02/20  pt hesitant to stand alone preferred hand held assist from PT though patient has been able to demonstrate independence with this in previous visits, 03/18/20 pt able to perform sit to stands with less impulsivity.  Patient using PT cueing for standing.  Patient can stand without assistance and has been able to demonstrate this in the clinic.  Intermittently prefers min CGA to transition.    Time 8    Period Weeks    Status Partially Met    Target Date 03/30/20      PT LONG TERM GOAL #4   Title Patient will demo ability to ambulate > 400 ft w/ LRAD versus hand held assist min assist to demonstrate improved household mobility and short community distances.    Baseline TBA, 01/20/20  167f with PT in front of patient, hand held assist, no AD, 03/02/20  pt ambulating with hand held assist with PT in front, patient's gait pattern normalizing, less ataxic 200 ft prior to patient request to sit. 03/18/20  pt able to ambule with hand heald assist greater than 400 ft.  The patient occasionally requires sit breaks due to leg fatigue.  Patient pattern continues to be less ataxic.    Time 8    Period Weeks    Status Partially Met    Target Date 03/30/20                  Plan - 04/01/20 1706    Clinical Impression Statement Patient required continued cueing throughout session for performing activities.  Patient with min  A needed for functional activities in the clinic.  The patient continues to benefit from additional skilled PT services to improve motor control and stability for improved quality of life.    Personal Factors and Comorbidities Comorbidity 1;Behavior Pattern;Transportation    Comorbidities HTN    Examination-Activity Limitations Bed Mobility;Dressing;Hygiene/Grooming;Caring for Others;Stairs;Stand;Toileting;Transfers;Sit;Bathing    Examination-Participation Restrictions Occupation;Medication Management;Cleaning    Stability/Clinical Decision Making Evolving/Moderate complexity    Rehab Potential Good    PT Frequency 2x / week    PT Duration 8 weeks    PT Treatment/Interventions ADLs/Self Care Home Management;Aquatic Therapy;Electrical Stimulation;DME Instruction;Gait training;Stair training;Functional mobility training;Therapeutic activities;Therapeutic exercise;Balance training;Neuromuscular re-education;Wheelchair mobility training;Patient/family education;Orthotic Fit/Training;Manual techniques;Passive range of motion    PT Next Visit Plan FYI- Pt does have feeding tube still. Pt is impulsive. Continue to work on seated and standing balance, BLE strengthening, coordination activities, core strengthening. Gait training with hand held assist of 2 people. Did well with 1 person in front and 1 in back with decreasing support today.    Consulted and Agree with Plan of Care Patient;Family member/caregiver    Family Member Consulted Mom/Dad           Patient will benefit from skilled therapeutic intervention in order to improve the following deficits and impairments:  Abnormal gait,Decreased balance,Decreased endurance,Decreased mobility,Difficulty walking,Impaired tone,Impaired sensation,Pain,Impaired UE functional use,Decreased  strength,Decreased safety awareness,Decreased knowledge of use of DME,Decreased coordination,Decreased activity tolerance,Decreased cognition,Decreased range of motion  Visit Diagnosis: Muscle weakness (generalized)  Other lack of coordination  Cognitive communication deficit  Unsteadiness on feet  Other abnormalities of gait and mobility  Difficulty in walking, not elsewhere classified     Problem List Patient Active Problem List   Diagnosis Date Noted  . Abnormality of gait 03/16/2020  . Sleep disturbance 03/16/2020  . Oropharyngeal dysphagia 02/12/2020  . Coronary artery disease involving native coronary artery of native heart without angina pectoris 12/12/2019  . Ischemic cardiomyopathy 12/12/2019  . Brain injury ([place])   . Prediabetes   . Anoxic brain injury ([place]) 11/05/2019  . Dysphagia 11/05/2019  . Physical deconditioning 11/05/2019  . Acute delirium 11/05/2019  . Respiratory failure ([place])   . Acute metabolic encephalopathy   . Encounter for central line placement   . Ventricular fibrillation ([place]) 09/23/2019  . Acute combined systolic and diastolic heart failure ([place]) 09/23/2019  . Ventricular tachycardia, polymorphic ([place]) 09/23/2019  . Pelvic pain affecting pregnancy 10/15/2015  . Supervision of normal pregnancy in third trimester 09/28/2015  . Poor weight gain of pregnancy 09/28/2015  . Iron deficiency anemia of pregnancy 09/01/2015  . Increased BMI (body mass index) 07/29/2015    Hal Morales PT, DPT 04/01/2020, 5:13 PM  [place] MAIN [facility] SERVICES [address] [place], [place], [address] Phone: [phone]   Fax:  [phone]  Name: MAKINZY CLEERE MRN: [id number] Date of Birth: Aug 18, 1985

## 2020-04-05 ENCOUNTER — Ambulatory Visit: Payer: Medicaid Other

## 2020-04-05 ENCOUNTER — Other Ambulatory Visit: Payer: Self-pay

## 2020-04-05 DIAGNOSIS — M6281 Muscle weakness (generalized): Secondary | ICD-10-CM

## 2020-04-05 DIAGNOSIS — R2681 Unsteadiness on feet: Secondary | ICD-10-CM

## 2020-04-05 DIAGNOSIS — R278 Other lack of coordination: Secondary | ICD-10-CM

## 2020-04-05 DIAGNOSIS — R2689 Other abnormalities of gait and mobility: Secondary | ICD-10-CM

## 2020-04-05 DIAGNOSIS — R41841 Cognitive communication deficit: Secondary | ICD-10-CM | POA: Diagnosis not present

## 2020-04-05 NOTE — Therapy (Signed)
[place] MAIN [facility] SERVICES [address] [place], [place], [address] Phone: [phone]   Fax:  [phone]  Physical Therapy Treatment  Patient Details  Name: Beverly [name] MRN: [id number] Date of Birth: [birth year] Referring Provider (PT): Mercy Riding, MD   Encounter Date: 04/05/2020   PT End of Session - 04/05/20 1705    Visit Number 23    Number of Visits 25    PT Start Time 8099    PT Stop Time 1600    PT Time Calculation (min) 44 min    Equipment Utilized During Treatment Gait belt    Activity Tolerance Patient tolerated treatment well    Behavior During Therapy [facility] for tasks assessed/performed           Past Medical History:  Diagnosis Date  . Brain injury ([place])   . CAD (coronary artery disease)   . HLD (hyperlipidemia)   . Hypertension    last pregnancy  . Ischemic cardiomyopathy   . STEMI (ST elevation myocardial infarction) ([place]) 09/2019   SCAD with aneurysmal dilation of proximal LAD.  . Vaginal Pap smear, abnormal    when she was 35yo  . Ventricular fibrillation ([place]) 09/23/2019    Past Surgical History:  Procedure Laterality Date  . IR GASTROSTOMY TUBE MOD SED  10/07/2019  . LEFT HEART CATH AND CORONARY ANGIOGRAPHY N/A 09/23/2019   Procedure: LEFT HEART CATH AND CORONARY ANGIOGRAPHY;  Surgeon: Nelva Bush, MD;  Location: [place] [facility];  Service: Cardiovascular;  Laterality: N/A;    There were no vitals filed for this visit.  TREATMENT  CGA- min assist x1 provided throughout session, frequent cueing for exercise technique and to focus on tasks   Nu step level 2 x 5 mins, cueing for coordination/sequencing/hand and foot placement/to attend to task  Soccer ball kicks, alternating legs seated - x multiple reps. VC and demonstration required throughout   Standing balloon toss catching/throwing - x multiple reps, CGA  Standing basketball bounce - x multiple reps, CGA  Standing basketball toss  - x multiple reps, CGA  Sit>stand>ambulation to mirror with placing balls on target x8 CGA-min assist, frequent VC/TC/demonstration   Airex pad normal stance stacking cones low to high, high to low, and side to side x multiple reps, CGA. Frequent cues throughout.     PT Short Term Goals - 03/18/20 1638      PT SHORT TERM GOAL #1   Title Patient/caregiver will report independence with HEP with caregiver assistance (All STGs Due: 01/07/2020)    Baseline reports independence; only completing 1-2x/week per mom reports 01/20/20  patient's caregiver assisting patient with HEP, 03/02/20  pt doing exercises at home regularly, 03/18/20  pt walking at home with supervision, doing exercises.    Time 4    Period Weeks    Status Partially Met    Target Date 03/30/20      PT SHORT TERM GOAL #2   Title Patient will demo ability to complete transfer from w/c <> mat with CGA with LRAD to demonstrate improved functional mobility    Baseline intermittent CGA- Min A, 01/20/20  continues with intermittent need to transfer from WC<> mat no AD CGA, min assist or close supervision at home with transfers without use of AD.    Time 4    Period Weeks    Status Partially Met    Target Date 03/30/20      PT SHORT TERM GOAL #3   Title Patient  will demo ability to ambulate >/= 200 ft  with LRAD or hand hand assist with moderate assistance to demonstrate improved functional mobility in home.    Baseline 115 ft max assist +2 hand held assist., 01/20/20  pt ambulating 175 ft with PT hand had assist in front of patient, no AD.  03/02/20  200 ft with PT hand held assist, min A    Time 4    Period Weeks    Status Achieved    Target Date 03/30/20             PT Long Term Goals - 03/18/20 1639      PT LONG TERM GOAL #1   Title Patient/caregiver will report indepence with final HEP with caregiver assistance (All LTGS: 02/04/2020)    Baseline no HEP established, 01/20/20  caregiver assisting PT with home program     Time 8    Period Weeks    Status On-going    Target Date 03/30/20      PT LONG TERM GOAL #2   Title Patient will demo ability to complete all bed mobility Mod I to demonstrate improved independence    Baseline CGA/Supervision, 01/20/20 supervision, intermitten mod I to perform bed mobility, 03/02/20 supervision with bed mobility by caregiver min assist on occasion, 03/18/20  same as previous update    Time 8    Period Weeks    Status Partially Met    Target Date 03/30/20      PT LONG TERM GOAL #3   Title Patient will demo ability to complete sit <> stand x 5 reps with supervision to demonstrate improved balance and functional mobility    Baseline CGA - Min A, 01/20/20  intermittent min A to perform sit to stand, sits from a standing position. CGA for safety, 03/02/20  pt hesitant to stand alone preferred hand held assist from PT though patient has been able to demonstrate independence with this in previous visits, 03/18/20 pt able to perform sit to stands with less impulsivity.  Patient using PT cueing for standing.  Patient can stand without assistance and has been able to demonstrate this in the clinic.  Intermittently prefers min CGA to transition.    Time 8    Period Weeks    Status Partially Met    Target Date 03/30/20      PT LONG TERM GOAL #4   Title Patient will demo ability to ambulate > 400 ft w/ LRAD versus hand held assist min assist to demonstrate improved household mobility and short community distances.    Baseline TBA, 01/20/20  153f with PT in front of patient, hand held assist, no AD, 03/02/20  pt ambulating with hand held assist with PT in front, patient's gait pattern normalizing, less ataxic 200 ft prior to patient request to sit. 03/18/20  pt able to ambule with hand heald assist greater than 400 ft.  The patient occasionally requires sit breaks due to leg fatigue.  Patient pattern continues to be less ataxic.    Time 8    Period Weeks    Status Partially Met    Target  Date 03/30/20            Plan - 04/05/20 1706    Clinical Impression Statement Pt requires frequent verbal cues to attend to tasks. She was CGA-min assist this session with standing activities and ambulation. She shows improved UE use with placing velcro objects on dart board for standing activity. Pt will benefit from further  skilled therapy to improve motor control and dynamic stability in order to improve safety with all functional mobility.    Personal Factors and Comorbidities Comorbidity 1;Behavior Pattern;Transportation    Comorbidities HTN    Examination-Activity Limitations Bed Mobility;Dressing;Hygiene/Grooming;Caring for Others;Stairs;Stand;Toileting;Transfers;Sit;Bathing    Examination-Participation Restrictions Occupation;Medication Management;Cleaning    Stability/Clinical Decision Making Evolving/Moderate complexity    Rehab Potential Good    PT Frequency 2x / week    PT Duration 8 weeks    PT Treatment/Interventions ADLs/Self Care Home Management;Aquatic Therapy;Electrical Stimulation;DME Instruction;Gait training;Stair training;Functional mobility training;Therapeutic activities;Therapeutic exercise;Balance training;Neuromuscular re-education;Wheelchair mobility training;Patient/family education;Orthotic Fit/Training;Manual techniques;Passive range of motion    PT Next Visit Plan FYI- Pt does have feeding tube still. Pt is impulsive. Continue to work on seated and standing balance, BLE strengthening, coordination activities, core strengthening. Gait training with hand held assist of 2 people. Did well with 1 person in front and 1 in back with decreasing support today.    Consulted and Agree with Plan of Care Patient;Family member/caregiver    Family Member Consulted Mom/Dad           Patient will benefit from skilled therapeutic intervention in order to improve the following deficits and impairments:  Abnormal gait,Decreased balance,Decreased endurance,Decreased  mobility,Difficulty walking,Impaired tone,Impaired sensation,Pain,Impaired UE functional use,Decreased strength,Decreased safety awareness,Decreased knowledge of use of DME,Decreased coordination,Decreased activity tolerance,Decreased cognition,Decreased range of motion  Visit Diagnosis: Other lack of coordination  Other abnormalities of gait and mobility  Unsteadiness on feet  Muscle weakness (generalized)     Problem List Patient Active Problem List   Diagnosis Date Noted  . Abnormality of gait 03/16/2020  . Sleep disturbance 03/16/2020  . Oropharyngeal dysphagia 02/12/2020  . Coronary artery disease involving native coronary artery of native heart without angina pectoris 12/12/2019  . Ischemic cardiomyopathy 12/12/2019  . Brain injury ([place])   . Prediabetes   . Anoxic brain injury ([place]) 11/05/2019  . Dysphagia 11/05/2019  . Physical deconditioning 11/05/2019  . Acute delirium 11/05/2019  . Respiratory failure ([place])   . Acute metabolic encephalopathy   . Encounter for central line placement   . Ventricular fibrillation ([place]) 09/23/2019  . Acute combined systolic and diastolic heart failure ([place]) 09/23/2019  . Ventricular tachycardia, polymorphic ([place]) 09/23/2019  . Pelvic pain affecting pregnancy 10/15/2015  . Supervision of normal pregnancy in third trimester 09/28/2015  . Poor weight gain of pregnancy 09/28/2015  . Iron deficiency anemia of pregnancy 09/01/2015  . Increased BMI (body mass index) 07/29/2015   Ricard Dillon PT, DPT 04/05/2020, 5:14 PM  [place] MAIN [facility] SERVICES [address] [place], [place], [address] Phone: [phone]   Fax:  [phone]  Name: Beverly [name] MRN: [id number] Date of Birth: 1986-01-09

## 2020-04-06 NOTE — Addendum Note (Signed)
Addended by: Keene Breath B on: 04/06/2020 03:30 PM   Modules accepted: Orders

## 2020-04-08 ENCOUNTER — Ambulatory Visit: Payer: Medicaid Other | Attending: Student | Admitting: Speech Pathology

## 2020-04-08 ENCOUNTER — Ambulatory Visit: Payer: Medicaid Other

## 2020-04-08 ENCOUNTER — Other Ambulatory Visit: Payer: Self-pay

## 2020-04-08 DIAGNOSIS — R471 Dysarthria and anarthria: Secondary | ICD-10-CM

## 2020-04-08 DIAGNOSIS — R41841 Cognitive communication deficit: Secondary | ICD-10-CM | POA: Diagnosis not present

## 2020-04-08 DIAGNOSIS — R278 Other lack of coordination: Secondary | ICD-10-CM | POA: Diagnosis present

## 2020-04-08 DIAGNOSIS — G931 Anoxic brain damage, not elsewhere classified: Secondary | ICD-10-CM | POA: Diagnosis present

## 2020-04-08 DIAGNOSIS — R2681 Unsteadiness on feet: Secondary | ICD-10-CM | POA: Insufficient documentation

## 2020-04-08 DIAGNOSIS — R1312 Dysphagia, oropharyngeal phase: Secondary | ICD-10-CM

## 2020-04-08 DIAGNOSIS — R2689 Other abnormalities of gait and mobility: Secondary | ICD-10-CM | POA: Insufficient documentation

## 2020-04-08 NOTE — Therapy (Signed)
[place] MAIN [facility] SERVICES [address] [place], [place], [address] Phone: [phone]   Fax:  [phone]  Speech Language Pathology Treatment and Discharge Summary  Patient Details  Name: Beverly [name] MRN: [id number] Date of Birth: 12-21-1985 Referring Provider (SLP): Dr. Wendee Beavers   Encounter Date: 04/08/2020   End of Session - 04/08/20 1704    Visit Number 19    Number of Visits 25    Date for SLP Re-Evaluation 05/08/20    Authorization Type awaiting medicaid    Authorization Time Period 11 ST visits after 1/1    Authorization - Visit Number 11    Authorization - Number of Visits 11    Progress Note Due on Visit 10    SLP Start Time 1400    SLP Stop Time  1500    SLP Time Calculation (min) 60 min    Activity Tolerance Patient tolerated treatment well           Past Medical History:  Diagnosis Date  . Brain injury ([place])   . CAD (coronary artery disease)   . HLD (hyperlipidemia)   . Hypertension    last pregnancy  . Ischemic cardiomyopathy   . STEMI (ST elevation myocardial infarction) ([place]) 09/2019   SCAD with aneurysmal dilation of proximal LAD.  . Vaginal Pap smear, abnormal    when she was 35yo  . Ventricular fibrillation ([place]) 09/23/2019    Past Surgical History:  Procedure Laterality Date  . IR GASTROSTOMY TUBE MOD SED  10/07/2019  . LEFT HEART CATH AND CORONARY ANGIOGRAPHY N/A 09/23/2019   Procedure: LEFT HEART CATH AND CORONARY ANGIOGRAPHY;  Surgeon: Nelva Bush, MD;  Location: [facility];  Service: Cardiovascular;  Laterality: N/A;    There were no vitals filed for this visit.   Subjective Assessment - 04/08/20 1656    Subjective "Good."    Patient is accompained by: Family member   mom [name]   Currently in Pain? No/denies                 ADULT SLP TREATMENT - 04/08/20 1657      General Information   Behavior/Cognition Alert;Cooperative;Pleasant mood;Requires  cueing;Decreased sustained attention    HPI Pt is a 35 y.o. female with PMH of HTN who was admitted 09/23/19 2 weeks post partum after emergent C section due to rupture of membranes with VF arrest. Pt with enterobacter PNA and encephalopathy, trach placed 8/30 and PEG on 8/31. MRI 8/31: No evidence of anoxic brain injury. Pt changed to #4 cuffless trach on 9/15; capped on 9/21 and decannulated on 9/25. MBS 9/22, POs started - primary oral dysphagia.   Pt currently using PEG as primary nutrition and hydration and for some meds, due to behavior.      Treatment Provided   Treatment provided Cognitive-Linquistic      Pain Assessment   Pain Assessment No/denies pain      Cognitive-Linquistic Treatment   Treatment focused on Patient/family/caregiver education;Cognition    Skilled Treatment Beverly [name] continues to have intermittent success with eating. Mom reports pt ate a burger yesterday, but nothing today. PEG remains primary source of nutrition. Reports Beverly [name] has been more alert with medication adjustments. Provided updated memory book pages with photos of pt's children and friends. SLP able to engage Beverly [name] in simple conversation re: topics such as family, friends, vacations, favorite foods. Arelly maintained sustained attention to conversation for periods of up to 10 minutes. With occasional min cues  to slow rate, her speech was ~85%-90 intelligible to this familiar listener. Today was planned d/c visit due to Medicaid visits exhausted. Demonstrated cognitive activities for home as well as strategies to engage Beverly [name] in conversation. Mom demonstrates appropriate cuing. Beverly [name] will continue speech therapy at [facility] 2x per week, starting in about 2 weeks.      Assessment / Recommendations / Plan   Plan Discharge SLP treatment due to (comment)   Medicaid visits exhausted; pt to continue speech at [facility] clinic     Progression Toward Goals   Progression toward goals --   Goals partially met, pt d/c            SLP Education - 04/08/20 1704    Education Details cognitive activities for home    Person(s) Educated Patient;Parent(s)    Methods Explanation    Comprehension Verbalized understanding            SLP Short Term Goals - 04/08/20 1713      SLP SHORT TERM GOAL #1   Title Beverly [name] will sustain attention for 5 minutes to simple cognitive task with occasional min A over 3 sessions    Time 5    Period Weeks    Status Achieved      SLP SHORT TERM GOAL #2   Title Pt will verbalize 2 safety rules for walking at home with occasional min A over 3 sessions    Baseline able to verbalize with use of memory book    Time 5    Period Weeks    Status Achieved      SLP SHORT TERM GOAL #3   Title Pt will use simple external aid to follow simple schedule and prepare for appointments with usual min A from family    Baseline does not recall or prepare for appointments    Time 5    Period Weeks    Status Partially Met      SLP SHORT TERM GOAL #4   Title Pt will use visual and written word cues to wash her face with usual min verbal cues from mom over 3 sessions    Baseline Per mom, pt only touches her face 2-3 times when asked to wash her face    Time 5    Period Weeks    Status Achieved            SLP Long Term Goals - 04/08/20 1713      SLP LONG TERM GOAL #1   Title Pt will use external aids/to do list to complete 3 simple chores a day with occasional min A over 3 sessions    Time 11    Period Weeks    Status Not Met      SLP LONG TERM GOAL #2   Title Pt will sustain attention for 10 minutes to simple cognitive task with 4 or less re-directions over 3 sessions    Baseline sustains attention for 5-10 minutes inconsistently    Time 11    Period Weeks    Status Achieved      SLP LONG TERM GOAL #3   Title Pt will use visual cues and schedule to eat 4 oz solids and drink 8 oz of liquid with usual min A over 3 sessions    Baseline pt eating less than 1 meal a day and taking  only sips (PEG is used)    Time 11    Period Weeks    Status Not Met  SLP LONG TERM GOAL #4   Title Pt will use reduced rate to be intellgible in 5 minute conversation with occasional min A    Baseline Rapid rate, rushes of speech, improved intelligibility to ~50% of utterances    Time 11    Period Weeks    Status Achieved            Plan - 04/08/20 1704    Clinical Impression Statement Para maintained sustained attention up to 10 minutes with occasional min cues. Severe cognitive-linguistic deficits persist. Cognitively-based dysphagia persists; she is capable of consuming regular solids and thin liquids without overt signs of aspiration, however has difficulty maintaining attention to bolus, initiating swallow. Beverly [name] has made some gains in ability to sustain attention to POs at home, although success fluctuates day to day. Mother demonstrates appropriate cuing for pt and knowledge of cognitive/language activities for patient at home. Beverly [name] reviews her memory book and is better able to remember her children's names, but sometimes forgets that she has children. She was able to engage in simple conversation today about personally relevant information. Beverly [name] has continued to make steady progress over recent visits; insufficient time to meet goals due to limited visits with Medicaid. She will continue with speech therapy at [facility]; patient and mother in agreement with d/c today.    Speech Therapy Frequency --   d/c   Duration 12 weeks   d/c   Treatment/Interventions Language facilitation;Environmental controls;Cueing hierarchy;SLP instruction and feedback;Aspiration precaution training;Compensatory techniques;Cognitive reorganization;Functional tasks;Compensatory strategies;Diet toleration management by SLP;Trials of upgraded texture/liquids;Internal/external aids;Multimodal communcation approach;Patient/family education    Potential to Achieve Goals Fair    Potential Considerations  Severity of impairments;Financial resources           Patient will benefit from skilled therapeutic intervention in order to improve the following deficits and impairments:   Cognitive communication deficit  Dysarthria and anarthria  Dysphagia, oropharyngeal phase  Anoxic brain injury [facility])    Problem List Patient Active Problem List   Diagnosis Date Noted  . Abnormality of gait 03/16/2020  . Sleep disturbance 03/16/2020  . Oropharyngeal dysphagia 02/12/2020  . Coronary artery disease involving native coronary artery of native heart without angina pectoris 12/12/2019  . Ischemic cardiomyopathy 12/12/2019  . Brain injury ([place])   . Prediabetes   . Anoxic brain injury ([place]) 11/05/2019  . Dysphagia 11/05/2019  . Physical deconditioning 11/05/2019  . Acute delirium 11/05/2019  . Respiratory failure ([place])   . Acute metabolic encephalopathy   . Encounter for central line placement   . Ventricular fibrillation ([place]) 09/23/2019  . Acute combined systolic and diastolic heart failure ([place]) 09/23/2019  . Ventricular tachycardia, polymorphic ([place]) 09/23/2019  . Pelvic pain affecting pregnancy 10/15/2015  . Supervision of normal pregnancy in third trimester 09/28/2015  . Poor weight gain of pregnancy 09/28/2015  . Iron deficiency anemia of pregnancy 09/01/2015  . Increased BMI (body mass index) 07/29/2015   SPEECH THERAPY DISCHARGE SUMMARY  Visits from Start of Care: 19  Current functional level related to goals / functional outcomes: Beverly [name] met 4/5 short term goals and 2/4 long term goals.    Remaining deficits: Severe cognitive communication impairment, moderate dysarthria, and cognitively-based dysphagia persist   Education / Equipment: [facility] speech therapy, memory book, cognitive games/apps for home  Plan: Patient agrees to discharge.  Patient goals were partially met. Patient is being discharged due to financial reasons.  ?????         Beverly Lever, MS,  Actor  Beverly [name] 04/08/2020, 5:14 PM  [place] MAIN [facility] SERVICES [address] [place], [place], [address] Phone: [phone]   Fax:  [phone]   Name: Beverly [name] MRN: [id number] Date of Birth: 30-Jul-1985

## 2020-04-14 ENCOUNTER — Ambulatory Visit: Payer: Medicaid Other

## 2020-04-14 ENCOUNTER — Other Ambulatory Visit: Payer: Self-pay

## 2020-04-14 DIAGNOSIS — R2681 Unsteadiness on feet: Secondary | ICD-10-CM

## 2020-04-14 DIAGNOSIS — R2689 Other abnormalities of gait and mobility: Secondary | ICD-10-CM

## 2020-04-14 DIAGNOSIS — R278 Other lack of coordination: Secondary | ICD-10-CM

## 2020-04-14 DIAGNOSIS — R41841 Cognitive communication deficit: Secondary | ICD-10-CM | POA: Diagnosis not present

## 2020-04-14 NOTE — Therapy (Signed)
[place] MAIN [facility] SERVICES [address] [place], [place], [address] Phone: [phone]   Fax:  [phone]  Physical Therapy Treatment/DISCHARGE SUMMARY  Patient Details  Name: Beverly [name] MRN: [id number] Date of Birth: July 26, 1985 Referring Provider (PT): Mercy Riding, MD   Encounter Date: 04/14/2020   PT End of Session - 04/14/20 1855    Visit Number 24    Number of Visits 25    PT Start Time 2683    PT Stop Time 1515    PT Time Calculation (min) 43 min    Equipment Utilized During Treatment Gait belt    Activity Tolerance Patient tolerated treatment well    Behavior During Therapy [facility] for tasks assessed/performed           Past Medical History:  Diagnosis Date  . Brain injury ([place])   . CAD (coronary artery disease)   . HLD (hyperlipidemia)   . Hypertension    last pregnancy  . Ischemic cardiomyopathy   . STEMI (ST elevation myocardial infarction) ([place]) 09/2019   SCAD with aneurysmal dilation of proximal LAD.  . Vaginal Pap smear, abnormal    when she was 35yo  . Ventricular fibrillation ([place]) 09/23/2019    Past Surgical History:  Procedure Laterality Date  . IR GASTROSTOMY TUBE MOD SED  10/07/2019  . LEFT HEART CATH AND CORONARY ANGIOGRAPHY N/A 09/23/2019   Procedure: LEFT HEART CATH AND CORONARY ANGIOGRAPHY;  Surgeon: Nelva Bush, MD;  Location: [facility];  Service: Cardiovascular;  Laterality: N/A;    There were no vitals filed for this visit.   Subjective Assessment - 04/14/20 1853    Subjective Pt with mother today. Pt reports no pain. [name] says they trying to get set up with the [place] clinic.    Patient is accompained by: Family member   mom   Pertinent History HTN    Limitations Sitting;Standing;Walking;House hold activities    Patient Stated Goals walk better    Currently in Pain? No/denies         TREATMENT - REASSESSING GOALS/DISCHARGE   5 sit <> stand - Pt required CGA-m in  assist, still with some minor instabliity with initial stand  AMB. > 400 ft w/ LRAD: pt ambulated <400 ft with RW d/t fatigue and time constraints. She required a seated rest break prior to 200 ft and ambulated with additional WC-follow for remainder of time. She ambulated approx 300 feet and required close CGA throughout, possibly affected by busier environment today.  Transfer from w/c <> mat 2x: Pt requires hand-held assist-min assist x1 for TC/helping pt with initial postural stability when she first comes to standing.  Bed mobility: standing<>seated EOB<>supine 1x, Pt requires mod I- CGA; mod VC/TC for technique   Education/review of HEP provided for the following for frequency, sets, reps: Access Code: [email or web address] URL: [email or web address] Date: 04/14/2020 Prepared by: Ricard Dillon Sit to Stand with Counter Support - 1 x daily - 4 x weekly - 3 sets - 12 reps Seated March - 1 x daily - 4 x weekly - 3 sets - 12 reps Seated Hip Abduction with Resistance - 1 x daily - 4 x weekly - 3 sets - 12 reps Seated Hamstring Curl with Anchored Resistance - 1 x daily - 4 x weekly - 3 sets - 12 reps  Assessment: Today is pt's last session as pt reaching maximum number of visits for the year. Goal of session to reassess pt functional status and  issue/advance additional HEP in order to assure pt can continue making gains outside of therapy. Pt has partially achieved majority of remaining goals. [name] present and confirmed they are independent with HEP. Pt currently requires CGA-min assist for majority of functional mobility in goals assessed. The exception is pt is mod I-CGA with bed mobility. While pt is capable of ambulating with hand-held assist as demonstrated in prior sessions, to increase safe community participation pt and her mother were instructed on importance of use of her RW and were instructed in proper technique as pt gait still slightly ataxic. PT discussed with pt following-up  with referral for [place] clinic so pt can continue making gains. Pt would benefit from future therapy sessions, but is to be discharged at this time due to insurance.    PT Short Term Goals - 04/14/20 1435      PT SHORT TERM GOAL #1   Title Patient/caregiver will report independence with HEP with caregiver assistance (All STGs Due: 01/07/2020)    Baseline reports independence; only completing 1-2x/week per mom reports 01/20/20  patient's caregiver assisting patient with HEP, 03/02/20  pt doing exercises at home regularly, 03/18/20  pt walking at home with supervision, doing exercises.; 04/14/2020 [name] caregiver (mother) reports independence with HEP.    Time 4    Period Weeks    Status Achieved    Target Date 03/30/20      PT SHORT TERM GOAL #2   Title Patient will demo ability to complete transfer from w/c <> mat with CGA with LRAD to demonstrate improved functional mobility    Baseline intermittent CGA- Min A, 01/20/20  continues with intermittent need to transfer from WC<> mat no AD CGA, min assist or close supervision at home with transfers without use of AD. ; 04/14/2020 Pt requires hand-held assist-min assist x1 for TC/helping pt with initial postural stability when she first comes to standing.    Time 4    Period Weeks    Status Partially Met    Target Date 03/30/20      PT SHORT TERM GOAL #3   Title Patient will demo ability to ambulate >/= 200 ft  with LRAD or hand hand assist with moderate assistance to demonstrate improved functional mobility in home.    Baseline 115 ft max assist +2 hand held assist., 01/20/20  pt ambulating 175 ft with PT hand had assist in front of patient, no AD.  03/02/20  200 ft with PT hand held assist, min A    Time 4    Period Weeks    Status Achieved    Target Date 03/30/20             PT Long Term Goals - 04/14/20 1441      PT LONG TERM GOAL #1   Title Patient/caregiver will report indepence with final HEP with caregiver assistance (All LTGS:  02/04/2020)    Baseline no HEP established, 01/20/20  caregiver assisting PT with home program; 04/14/2020 [name] caregiver present (mother), reports independents with HEP. Issued new HEP to maintain gains beyond therapy and reviewed them with pt's mother.    Time 8    Period Weeks    Status Achieved      PT LONG TERM GOAL #2   Title Patient will demo ability to complete all bed mobility Mod I to demonstrate improved independence    Baseline CGA/Supervision, 01/20/20 supervision, intermitten mod I to perform bed mobility, 03/02/20 supervision with bed mobility by caregiver min assist  on occasion, 03/18/20  same as previous update; 04/14/2020 Pt requires mod I- CGA; mod VC/TC for technique    Time 8    Period Weeks    Status Partially Met      PT LONG TERM GOAL #3   Title Patient will demo ability to complete sit <> stand x 5 reps with supervision to demonstrate improved balance and functional mobility    Baseline CGA - Min A, 01/20/20  intermittent min A to perform sit to stand, sits from a standing position. CGA for safety, 03/02/20  pt hesitant to stand alone preferred hand held assist from PT though patient has been able to demonstrate independence with this in previous visits, 03/18/20 pt able to perform sit to stands with less impulsivity.  Patient using PT cueing for standing.  Patient can stand without assistance and has been able to demonstrate this in the clinic.  Intermittently prefers min CGA to transition.; 04/14/2020 Pt requires CGAmin assist still due to minor instability with initial stand.    Time 8    Period Weeks    Status Partially Met      PT LONG TERM GOAL #4   Title Patient will demo ability to ambulate > 400 ft w/ LRAD versus hand held assist min assist to demonstrate improved household mobility and short community distances.    Baseline TBA, 01/20/20  127f with PT in front of patient, hand held assist, no AD, 03/02/20  pt ambulating with hand held assist with PT in front, patient's  gait pattern normalizing, less ataxic 200 ft prior to patient request to sit. 03/18/20  pt able to ambule with hand heald assist greater than 400 ft.  The patient occasionally requires sit breaks due to leg fatigue.  Patient pattern continues to be less ataxic.; 04/14/2020 Today pt ambulated <400 ft with RW d/t fatigue and time constraints. She required a seated rest break prior to 200 ft and ambulated with additional WC follow for remainder of time. She ambulated approx 300 feet total and required close CGA throughout, possibly affected by busier environment today.    Time 8    Period Weeks    Status Partially Met                 Plan - 04/14/20 1925    Clinical Impression Statement Today is pt's last session as pt reaching maximum number of visits for the year. Goal of session to reassess pt functional status and issue/advance additional HEP in order to assure pt can continue making gains outside of therapy. Pt has partially achieved majority of remaining goals. [name] present and confirmed they are independent with HEP. Pt currently requires CGA-min assist for majority of functional mobility in goals assessed. The exception is pt is mod I-CGA with bed mobility. While pt is capable of ambulating with hand-held assist as demonstrated in prior sessions, to increase safe community participation pt and her mother were instructed on importance of use of her RW and were instructed in proper technique as pt gait still slightly ataxic. PT [facility] with [facility] making [facility]. Pt would benefit from future therapy sessions, but is to be discharged at this time due to insurance.    Personal Factors and Comorbidities Comorbidity 1;Behavior Pattern;Transportation    Comorbidities HTN    Examination-Activity Limitations Bed Mobility;Dressing;Hygiene/Grooming;Caring for Others;Stairs;Stand;Toileting;Transfers;Sit;Bathing    Examination-Participation  Restrictions Occupation;Medication Management;Cleaning    Stability/Clinical Decision Making Evolving/Moderate complexity  Rehab Potential Good    PT Frequency 2x / week    PT Duration 8 weeks    PT Treatment/Interventions ADLs/Self Care Home Management;Aquatic Therapy;Electrical Stimulation;DME Instruction;Gait training;Stair training;Functional mobility training;Therapeutic activities;Therapeutic exercise;Balance training;Neuromuscular re-education;Wheelchair mobility training;Patient/family education;Orthotic Fit/Training;Manual techniques;Passive range of motion    PT Next Visit Plan FYI- Pt does have feeding tube still. Pt is impulsive. Continue to work on seated and standing balance, BLE strengthening, coordination activities, core strengthening. Gait training with hand held assist of 2 people. Did well with 1 person in front and 1 in back with decreasing support today.    PT Home Exercise Plan New exercises added to HEP (see note)    Consulted and Agree with Plan of Care Patient;Family member/caregiver    Family Member Consulted Mom/Dad           Patient will benefit from skilled therapeutic intervention in order to improve the following deficits and impairments:  Abnormal gait,Decreased balance,Decreased endurance,Decreased mobility,Difficulty walking,Impaired tone,Impaired sensation,Pain,Impaired UE functional use,Decreased strength,Decreased safety awareness,Decreased knowledge of use of DME,Decreased coordination,Decreased activity tolerance,Decreased cognition,Decreased range of motion  Visit Diagnosis: Other abnormalities of gait and mobility  Other lack of coordination  Unsteadiness on feet     Problem List Patient Active Problem List   Diagnosis Date Noted  . Abnormality of gait 03/16/2020  . Sleep disturbance 03/16/2020  . Oropharyngeal dysphagia 02/12/2020  . Coronary artery disease involving native coronary artery of native heart without angina pectoris 12/12/2019   . Ischemic cardiomyopathy 12/12/2019  . Brain injury ([place])   . Prediabetes   . Anoxic brain injury ([place]) 11/05/2019  . Dysphagia 11/05/2019  . Physical deconditioning 11/05/2019  . Acute delirium 11/05/2019  . Respiratory failure ([place])   . Acute metabolic encephalopathy   . Encounter for central line placement   . Ventricular fibrillation ([place]) 09/23/2019  . Acute combined systolic and diastolic heart failure ([place]) 09/23/2019  . Ventricular tachycardia, polymorphic ([place]) 09/23/2019  . Pelvic pain affecting pregnancy 10/15/2015  . Supervision of normal pregnancy in third trimester 09/28/2015  . Poor weight gain of pregnancy 09/28/2015  . Iron deficiency anemia of pregnancy 09/01/2015  . Increased BMI (body mass index) 07/29/2015   Ricard Dillon PT, DPT 04/15/2020, 5:16 PM  [place] MAIN [facility] SERVICES [address] [place], [place], [address] Phone: [phone]   Fax:  [phone]  Name: Beverly [name] MRN: [id number] Date of Birth: Jan 23, 1986

## 2020-04-15 ENCOUNTER — Encounter: Payer: Self-pay | Admitting: Physical Medicine & Rehabilitation

## 2020-04-15 ENCOUNTER — Other Ambulatory Visit: Payer: Self-pay

## 2020-04-15 ENCOUNTER — Encounter: Payer: Medicaid Other | Attending: Physical Medicine & Rehabilitation | Admitting: Physical Medicine & Rehabilitation

## 2020-04-15 VITALS — BP 133/84 | HR 69 | Temp 98.8°F | Ht 69.0 in | Wt 158.2 lb

## 2020-04-15 DIAGNOSIS — R1312 Dysphagia, oropharyngeal phase: Secondary | ICD-10-CM | POA: Diagnosis not present

## 2020-04-15 DIAGNOSIS — R269 Unspecified abnormalities of gait and mobility: Secondary | ICD-10-CM

## 2020-04-15 DIAGNOSIS — G479 Sleep disorder, unspecified: Secondary | ICD-10-CM | POA: Diagnosis not present

## 2020-04-15 DIAGNOSIS — G931 Anoxic brain damage, not elsewhere classified: Secondary | ICD-10-CM | POA: Diagnosis not present

## 2020-04-15 NOTE — Progress Notes (Addendum)
Subjective:    Patient ID: Beverly [name], female    DOB: 03/27/1985, 35 y.o.   MRN: [id number]  HPI  Female with pmh of Vfib STEMI, HTN, anoxic BI presents with anoxic BI.   Initially stated: Mother provides history.  Started 09/23/19.  She delivered her 3rd child and had a heart attack 2 weeks later.  She was unresponsive for 4-5 minutes. Memory is biggest deficit.  She was not able to come to CIR due to insurance.  She was in the hospital for 65 days.  She did not have HH for the same reason.  She is outpatient therapies 2/week.  Mother had to quit her job and takes care of patient and her 3 children.  She now has a home health aid 5/week for 3.5 hours/day.  She is losing weight due to pocketing food and forgetting to swallow.  She has a PEG, which is being used.  Incontinent most of the time with bowel/bladder.  Sleep is poor. Currently taking benadryl.  She has fatigue.  She had falls initially when she came home from the hospital, but has been doing better.  She has tightness in arms/legs.  She is on benadryl and melatonin for sleep. Mother does notice improvement, but states other people have noticed improvement. Behavior waxes/wanes.    Last clinic visit 03/16/2020.  Since that time, mother states pt is awaiting further rehab. She had benefit with increase Amantadine. Mood has improved, with more stabilization. She has not had a "bad day" since last visit.  She is tolerating tube feeds.  Her appetitive is improving.  Denies falls. Sleep has improved.   Pain Inventory Average Pain 0 Pain Right Now 0 My pain is no pain   LOCATION OF PAIN  na  BOWEL Number of stools per week: 7 or more. Oral laxative use No  Type of laxative none Enema or suppository use No  History of colostomy No  Incontinent Yes   BLADDER Pads In and out cath, frequency na Able to self cath No  Bladder incontinence Yes  Frequent urination No  Leakage with coughing No  Difficulty starting stream No  Incomplete  bladder emptying No    Mobility walk with assistance how many minutes can you walk? 3-5 ability to climb steps?  yes do you drive?  no needs help with transfers Do you have any goals in this area?  yes  Function not employed: date last employed 08/2019 disabled: date disabled 09/23/2019 I need assistance with the following:  feeding, dressing, bathing, toileting, meal prep, household duties and shopping  Neuro/Psych weakness trouble walking spasms confusion anxiety  Prior Studies Any changes since last visit?  no  Physicians involved in your care Any changes since last visit?  no   Family History  Problem Relation Age of Onset  . Hyperlipidemia Mother   . Hypertension Mother   . Diabetes Maternal Grandmother   . Seizures Maternal Grandmother   . Thyroid disease Maternal Grandmother   . Diabetes Paternal Grandmother   . Rashes / Skin problems Son        ezcema  . Seizures Son    Social History   Socioeconomic History  . Marital status: Single    Spouse name: Not on file  . Number of children: 3  . Years of education: Not on file  . Highest education level: Not on file  Occupational History  . Occupation: oncology    Employer: [employer]    Comment: NA  Tobacco Use  . Smoking status: Never Smoker  . Smokeless tobacco: Never Used  Vaping Use  . Vaping Use: Never used  Substance and Sexual Activity  . Alcohol use: Not Currently    Alcohol/week: 0.0 standard drinks    Comment: quit 2017  . Drug use: No    Frequency: 5.0 times per week    Types: Marijuana    Comment: stopped when she found out she was pregnant  . Sexual activity: Not Currently    Partners: Male    Birth control/protection: None  Other Topics Concern  . Not on file  Social History Narrative   01/06/20 lives with mom and her children   Social Determinants of Health   Financial Resource Strain: Not on file  Food Insecurity: Not on file  Transportation Needs: Not on file  Physical  Activity: Not on file  Stress: Not on file  Social Connections: Not on file   Past Surgical History:  Procedure Laterality Date  . IR GASTROSTOMY TUBE MOD SED  10/07/2019  . LEFT HEART CATH AND CORONARY ANGIOGRAPHY N/A 09/23/2019   Procedure: LEFT HEART CATH AND CORONARY ANGIOGRAPHY;  Surgeon: Yvonne Kendall, MD;  Location: [facility] INVASIVE CV LAB;  Service: Cardiovascular;  Laterality: N/A;   Past Medical History:  Diagnosis Date  . Brain injury (HCC)   . CAD (coronary artery disease)   . HLD (hyperlipidemia)   . Hypertension    last pregnancy  . Ischemic cardiomyopathy   . STEMI (ST elevation myocardial infarction) (HCC) 09/2019   SCAD with aneurysmal dilation of proximal LAD.  . Vaginal Pap smear, abnormal    when she was 35yo  . Ventricular fibrillation (HCC) 09/23/2019   BP 133/84   Pulse 69   Temp 98.8 F (37.1 C)   Ht 5\' 9"  (1.753 m)   Wt 158 lb 3.2 oz (71.8 kg)   SpO2 99%   BMI 23.36 kg/m   Opioid Risk Score:   Fall Risk Score:  `1  Depression screen PHQ 2/9  Depression screen [facility] 2/9 04/15/2020 03/16/2020 02/12/2020  Decreased Interest 0 0 2  Down, Depressed, Hopeless 0 0 0  PHQ - 2 Score 0 0 2  Altered sleeping - - 2  Tired, decreased energy - - 1  Change in appetite - - 3  Feeling bad or failure about yourself  - - 0  Trouble concentrating - - 3  Moving slowly or fidgety/restless - - 3  Suicidal thoughts - - 0  PHQ-9 Score - - 14  Difficult doing work/chores - - Extremely dIfficult     Review of Systems  Unable to perform ROS: Other  Brain injury, unchanged     Objective:   Physical Exam  Constitutional: No distress . Vital signs reviewed. HENT: Normocephalic.  Atraumatic. Eyes: EOMI. No discharge. Cardiovascular: No JVD.   Respiratory: Normal effort.  No stridor.   GI: Non-distended.  +PEG. Skin: Warm and dry.  Intact. Psych: Normal mood.  Normal behavior. Musc: No edema in extremities.  No tenderness in extremities. Gait: Apraxic Neuro:  Alert and oriented x1. Dysarthria, stable Motor: Limited, grossly 4/5 throughout, unchanged No obvious tone noted, however, significant guarding    Assessment & Plan:  Female with pmh of Vfib STEMI, HTN, anoxic BI presents with anoxic BI  1. Anoxic BI - memory biggest complaint  MRI/CTs unremarkable  Labs reviewed  Chart/Referral information reviewed - Anoxic BI  PMAWARE reviewed  UDS performed  Continue speech therapy  Continue PT  Continue OT  Provide environmental management by reducing the level of stimulation, tolerating restlessness when possible, protecting patient from harming self or others and reducing patient's cognitive confusion.  Address behavioral concerns include providing structured environments and daily routines.  Cognitive therapy to direct modular abilities in order to maintain goals  including problem solving, self regulation/monitoring, self management, attention, and memory.  Avoid medications that could impair cognitive abilities, such as anticholinergics, antihistaminic, benzodiazapines, narcotics, etc when possible  Continue Amantadine to 100mg  with breakfast and lunch for fatigue  Will consider Ritalin  Will consider SSRI vs mood stabilizer, improving at present  Encouraged diary for good/bad days, reminded.  Cognition is improving  2. Dysphagia  Continue PEG feeds as necessary, however, oral intake is improving  Limited by cognition with pocketing food   Remove distractions  Supplement as necessary, limit if not necessary to promote appetite  Continue oral intake as tolerated  3. Gait abnormality  Continue ambulation with assistance   3. Sleep disturbance  Continue Melatonin 10mg  daily (vs prn)  Avoid benadryl  Educated on sleep hygiene and routine  Improving  Mother would like to follow up in 6 weeks

## 2020-05-28 ENCOUNTER — Other Ambulatory Visit: Payer: Self-pay | Admitting: Physical Medicine & Rehabilitation

## 2020-06-03 ENCOUNTER — Encounter: Payer: Medicaid Other | Attending: Physical Medicine & Rehabilitation | Admitting: Physical Medicine & Rehabilitation

## 2020-06-03 ENCOUNTER — Other Ambulatory Visit: Payer: Self-pay

## 2020-06-03 ENCOUNTER — Encounter: Payer: Self-pay | Admitting: Physical Medicine & Rehabilitation

## 2020-06-03 VITALS — BP 109/75 | HR 70 | Temp 98.3°F | Ht 69.0 in | Wt 155.0 lb

## 2020-06-03 DIAGNOSIS — R269 Unspecified abnormalities of gait and mobility: Secondary | ICD-10-CM

## 2020-06-03 DIAGNOSIS — R1312 Dysphagia, oropharyngeal phase: Secondary | ICD-10-CM

## 2020-06-03 DIAGNOSIS — G479 Sleep disorder, unspecified: Secondary | ICD-10-CM | POA: Diagnosis not present

## 2020-06-03 DIAGNOSIS — G931 Anoxic brain damage, not elsewhere classified: Secondary | ICD-10-CM

## 2020-06-03 NOTE — Progress Notes (Addendum)
Subjective:    Patient ID: Beverly [name], female    DOB: 1985-10-19, 35 y.o.   MRN: [id number]  HPI  Female with pmh of Vfib STEMI, HTN, anoxic BI presents with anoxic BI.   Initially stated: Mother provides history.  Started 09/23/19.  She delivered her 3rd child and had a heart attack 2 weeks later.  She was unresponsive for 4-5 minutes. Memory is biggest deficit.  She was not able to come to CIR due to insurance.  She was in the hospital for 65 days.  She did not have HH for the same reason.  She is outpatient therapies 2/week.  Mother had to quit her job and takes care of patient and her 3 children.  She now has a home health aid 5/week for 3.5 hours/day.  She is losing weight due to pocketing food and forgetting to swallow.  She has a PEG, which is being used.  Incontinent most of the time with bowel/bladder.  Sleep is poor. Currently taking benadryl.  She has fatigue.  She had falls initially when she came home from the hospital, but has been doing better.  She has tightness in arms/legs.  She is on benadryl and melatonin for sleep. Mother does notice improvement, but states other people have noticed improvement. Behavior waxes/wanes.    Last clinic visit 04/15/20.  Mother provides history. Since that time, pt is still in PT, trying to get back into OT/SLP. She is doing well with Amantadine. Mood has been good. Memory remains limited.  Ambulation has significantly improved.  She had a fall trying to ambulate without assistance, which normally she does not do. Eating has significantly improved. Sleep has improved.   Pain Inventory Average Pain 0 Pain Right Now 0 My pain is no pain   LOCATION OF PAIN  na  BOWEL Number of stools per week: 7 or more. Oral laxative use No  Type of laxative none Enema or suppository use No  History of colostomy No  Incontinent Yes   BLADDER Pads In and out cath, frequency na Able to self cath No  Bladder incontinence Yes  Frequent urination No   Leakage with coughing No  Difficulty starting stream No  Incomplete bladder emptying No    Mobility walk with assistance use a walker ability to climb steps?  no do you drive?  no needs help with transfers  Function not employed: date last employed 08/2019 disabled: date disabled 09/23/2019 I need assistance with the following:  feeding, dressing, bathing, toileting, meal prep, household duties and shopping  Neuro/Psych weakness trouble walking spasms confusion anxiety  Prior Studies Any changes since last visit?  no  Physicians involved in your care Any changes since last visit?  no   Family History  Problem Relation Age of Onset  . Hyperlipidemia Mother   . Hypertension Mother   . Diabetes Maternal Grandmother   . Seizures Maternal Grandmother   . Thyroid disease Maternal Grandmother   . Diabetes Paternal Grandmother   . Rashes / Skin problems Son        ezcema  . Seizures Son    Social History   Socioeconomic History  . Marital status: Single    Spouse name: Not on file  . Number of children: 3  . Years of education: Not on file  . Highest education level: Not on file  Occupational History  . 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  Smoking status: Never Smoker  . Smokeless tobacco: Never Used  Vaping Use  . Vaping Use: Never used  Substance and Sexual Activity  . Alcohol use: Not Currently    Alcohol/week: 0.0 standard drinks    Comment: quit 2017  . Drug use: No    Frequency: 5.0 times per week    Types: Marijuana    Comment: stopped when she found out she was pregnant  . Sexual activity: Not Currently    Partners: Male    Birth control/protection: None  Other Topics Concern  . Not on file  Social History Narrative   01/06/20 lives with mom and her children   Social Determinants of Health   Financial Resource Strain: Not on file  Food Insecurity: Not on file  Transportation Needs: Not on file   Physical Activity: Not on file  Stress: Not on file  Social Connections: Not on file   Past Surgical History:  Procedure Laterality Date  . IR GASTROSTOMY TUBE MOD SED  10/07/2019  . LEFT HEART CATH AND CORONARY ANGIOGRAPHY N/A 09/23/2019   Procedure: LEFT HEART CATH AND CORONARY ANGIOGRAPHY;  Surgeon: Yvonne Kendall, MD;  Location: [facility] INVASIVE CV LAB;  Service: Cardiovascular;  Laterality: N/A;   Past Medical History:  Diagnosis Date  . Brain injury (HCC)   . CAD (coronary artery disease)   . HLD (hyperlipidemia)   . Hypertension    last pregnancy  . Ischemic cardiomyopathy   . STEMI (ST elevation myocardial infarction) (HCC) 09/2019   SCAD with aneurysmal dilation of proximal LAD.  . Vaginal Pap smear, abnormal    when she was 35yo  . Ventricular fibrillation (HCC) 09/23/2019   BP 109/75   Pulse 70   Temp 98.3 F (36.8 C)   Ht 5\' 9"  (1.753 m)   Wt 155 lb (70.3 kg)   SpO2 97%   BMI 22.89 kg/m   Opioid Risk Score:   Fall Risk Score:  `1  Depression screen PHQ 2/9  Depression screen [facility] 2/9 04/15/2020 03/16/2020 02/12/2020  Decreased Interest 0 0 2  Down, Depressed, Hopeless 0 0 0  PHQ - 2 Score 0 0 2  Altered sleeping - - 2  Tired, decreased energy - - 1  Change in appetite - - 3  Feeling bad or failure about yourself  - - 0  Trouble concentrating - - 3  Moving slowly or fidgety/restless - - 3  Suicidal thoughts - - 0  PHQ-9 Score - - 14  Difficult doing work/chores - - Extremely dIfficult     Review of Systems  Unable to perform ROS: Other  Brain injury, unchanged     Objective:   Physical Exam  Constitutional: No distress . Vital signs reviewed. HENT: Normocephalic.  Atraumatic. Eyes: EOMI. No discharge. Cardiovascular: No JVD.   Respiratory: Normal effort.  No stridor.   GI: Non-distended.  +PEG.  Skin: Warm and dry.  Intact. Psych: Confused, however, significantly improving Musc: No edema in extremities.  No tenderness in extremities. Gait:  Apraxic, spastic, improving Neuro: Alert and oriented x2. Dysarthria, improving Motor: Limited, grossly 4/5 throughout, stable No obvious tone noted, however, significant guarding    Assessment & Plan:  Female with pmh of Vfib STEMI, HTN, anoxic BI presents with anoxic BI  1. Anoxic BI - memory biggest complaint  MRI/CTs unremarkable  Chart/Referral information reviewed - Anoxic BI  PMAWARE reviewed  UDS performed  Continue speech therapy  Continue PT  Resume OT  Provide environmental management by  reducing the level of stimulation, tolerating restlessness when possible, protecting patient from harming self or others and reducing patient's cognitive confusion.  Address behavioral concerns include providing structured environments and daily routines.  Cognitive therapy to direct modular abilities in order to maintain goals  including problem solving, self regulation/monitoring, self management, attention, and memory.  Avoid medications that could impair cognitive abilities, such as anticholinergics, antihistaminic, benzodiazapines, narcotics, etc when possible  Continue Amantadine to 100mg  with breakfast and lunch for fatigue  Will consider Ritalin  Will consider SSRI vs mood stabilizer, improving at present  Encouraged diary for good/bad days, reminded.  Cognition continues to improve  2. Dysphagia  Continue PEG feeds as necessary, however, oral intake continues to improve, encouraged limited tube feeds to stimulate appetite   Limited by cognition with pocketing food, improving   Remove distractions  Supplement as necessary, limit if not necessary to promote appetite  Continue oral intake as tolerated  3. Gait abnormality  Continue ambulation with walker  3. Sleep disturbance  Continue Melatonin 10mg  daily (vs prn)  Avoid benadryl  Educated on sleep hygiene and routine  Improving

## 2020-06-16 ENCOUNTER — Other Ambulatory Visit: Payer: Self-pay

## 2020-06-16 ENCOUNTER — Encounter: Payer: Self-pay | Admitting: Internal Medicine

## 2020-06-16 ENCOUNTER — Ambulatory Visit (INDEPENDENT_AMBULATORY_CARE_PROVIDER_SITE_OTHER): Payer: Medicaid Other | Admitting: Internal Medicine

## 2020-06-16 VITALS — BP 90/64 | HR 72 | Ht 69.0 in | Wt 162.0 lb

## 2020-06-16 DIAGNOSIS — I255 Ischemic cardiomyopathy: Secondary | ICD-10-CM

## 2020-06-16 DIAGNOSIS — I2510 Atherosclerotic heart disease of native coronary artery without angina pectoris: Secondary | ICD-10-CM | POA: Diagnosis not present

## 2020-06-16 DIAGNOSIS — E785 Hyperlipidemia, unspecified: Secondary | ICD-10-CM

## 2020-06-16 DIAGNOSIS — I25118 Atherosclerotic heart disease of native coronary artery with other forms of angina pectoris: Secondary | ICD-10-CM

## 2020-06-16 DIAGNOSIS — I5022 Chronic systolic (congestive) heart failure: Secondary | ICD-10-CM

## 2020-06-16 DIAGNOSIS — I4901 Ventricular fibrillation: Secondary | ICD-10-CM

## 2020-06-16 DIAGNOSIS — K9423 Gastrostomy malfunction: Secondary | ICD-10-CM | POA: Diagnosis not present

## 2020-06-16 DIAGNOSIS — G931 Anoxic brain damage, not elsewhere classified: Secondary | ICD-10-CM

## 2020-06-16 NOTE — Progress Notes (Signed)
Follow-up Outpatient Visit Date: 06/16/2020  Primary Care Provider: Suanne Marker, MD [address] [place] [address]  Chief Complaint: Follow-up coronary artery disease and cardiomyopathy  HPI:  Ms. Beverly [name] is a 35 y.o. 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  She was last seen in our office in 03/2020 by Gillian Shields, NP, at which time she was doing well from a heart standpoint.  Her mother reported difficulties with feeding leading to 14 pound weight loss over 2 months.  Today, Ms. Beverly [name] and her family note that she has been eating a little bit better though she is still receiving some feeds and medications through her G-tube.  Her family is concerned about occasional bleeding and leakage around the G-tube.  Is also on also seems to have quite a bit of saliva and frequently needs to spit this out.  She has not had any chest pain, shortness of breath, palpitations, or edema.  She had a mechanical fall about 3 weeks ago.  --------------------------------------------------------------------------------------------------  Past Medical History:  Diagnosis Date  . Brain injury (HCC)   . CAD (coronary artery disease)   . HLD (hyperlipidemia)   . Hypertension    last pregnancy  . Ischemic cardiomyopathy   . STEMI (ST elevation myocardial infarction) (HCC) 09/2019   SCAD with aneurysmal dilation of proximal LAD.  . Vaginal Pap smear, abnormal    when she was 35yo  . Ventricular fibrillation (HCC) 09/23/2019   Past Surgical History:  Procedure Laterality Date  . IR GASTROSTOMY TUBE MOD SED  10/07/2019  . LEFT HEART CATH AND CORONARY ANGIOGRAPHY N/A 09/23/2019   Procedure: LEFT HEART CATH AND CORONARY  ANGIOGRAPHY;  Surgeon: Yvonne Kendall, MD;  Location: [facility] INVASIVE CV LAB;  Service: Cardiovascular;  Laterality: N/A;     Current Meds  Medication Sig  . acetaminophen (TYLENOL) 160 MG/5ML suspension PLACE 20.3 MLS INTO FEEDING TUBE EVERY FOUR HOURS AS NEEDED FOR MODERATE PAIN, HEADACHE OR FEVER.  Marland Kitchen amantadine (SYMMETREL) 50 MG/5ML solution TAKE 10 MLS (100 MG TOTAL) BY MOUTH 2 (TWO) TIMES DAILY WITH BREAKFAST AND LUNCH.  Marland Kitchen aspirin 81 MG chewable tablet PLACE 1 TABLET (81 MG TOTAL) INTO FEEDING TUBE DAILY.  Marland Kitchen atorvastatin (LIPITOR) 80 MG tablet Place 1 tablet (80 mg total) into feeding tube daily.  . carvedilol (COREG) 6.25 MG tablet Place 1 tablet (6.25 mg total) into feeding tube 2 (two) times daily with a meal.  . feeding supplement (ENSURE ENLIVE / ENSURE PLUS) LIQD Take 237 mLs by mouth 2 (two) times daily between meals.  . melatonin 3 MG TABS tablet Place 1 tablet (3 mg total) into feeding tube at bedtime as needed (insomia/nocturnal agitation).  . pantoprazole (PROTONIX) 40 MG tablet TAKE 1 TABLET (40 MG TOTAL) BY MOUTH DAILY.  Marland Kitchen Prenatal Vit-Fe Fumarate-FA (PRENATAL MULTIVITAMIN) TABS tablet Place 1 tablet into feeding tube daily at 12 noon.  . Water For Irrigation, Sterile (FREE WATER) SOLN Place 200 mLs into feeding tube every 6 (six) hours.  . [DISCONTINUED] losartan (COZAAR) 25 MG tablet Place 1/2 tablet (12.5 mg) into feeding tube once daily   Current Facility-Administered Medications for the 06/16/20 encounter (Office Visit) with Tyjay Galindo, Cristal Deer, MD  Medication  . iron polysaccharides (NIFEREX) capsule 150 mg    Allergies: Patient has  no known allergies.  Social History   Tobacco Use  . Smoking status: Never Smoker  . Smokeless tobacco: Never Used  Vaping Use  . Vaping Use: Never used  Substance Use Topics  . Alcohol use: Not Currently    Alcohol/week: 0.0 standard drinks    Comment: quit 2017  . Drug use: No    Frequency: 5.0 times per week    Types: Marijuana     Comment: stopped when she found out she was pregnant    Family History  Problem Relation Age of Onset  . Hyperlipidemia Mother   . Hypertension Mother   . Diabetes Maternal Grandmother   . Seizures Maternal Grandmother   . Thyroid disease Maternal Grandmother   . Diabetes Paternal Grandmother   . Rashes / Skin problems Son        ezcema  . Seizures Son     Review of Systems: A 12-system review of systems was performed and was negative except as noted in the HPI.  --------------------------------------------------------------------------------------------------  Physical Exam: BP 90/64 (BP Location: Right Arm, Patient Position: Sitting, Cuff Size: Normal)   Pulse 72   Ht 5\' 9"  (1.753 m)   Wt 162 lb (73.5 kg)   SpO2 97%   BMI 23.92 kg/m   General:  NAD. Neck: No JVD or HJR. Lungs: Clear to auscultation bilaterally without wheezes or crackles. Heart: Regular rate and rhythm without murmurs, rubs, or gallops. Abdomen: Soft, nontender, nondistended. Extremities: No lower extremity edema.  EKG: Normal sinus rhythm with rightward axis, low voltage, and poor R wave progression, which may reflect lead placement.  Unchanged from prior tracing on 03/15/2020.  Lab Results  Component Value Date   WBC 8.5 03/15/2020   HGB 12.5 03/15/2020   HCT 38.4 03/15/2020   MCV 84 03/15/2020   PLT 537 (H) 03/15/2020    Lab Results  Component Value Date   NA 136 03/15/2020   K 4.7 03/15/2020   CL 99 03/15/2020   CO2 26 03/15/2020   BUN 10 03/15/2020   CREATININE 0.70 03/15/2020   GLUCOSE 104 (H) 03/15/2020   ALT 25 03/15/2020    Lab Results  Component Value Date   CHOL 107 03/15/2020   HDL 42 03/15/2020   LDLCALC 41 03/15/2020   LDLDIRECT 48 03/15/2020   TRIG 137 03/15/2020   CHOLHDL 2.5 03/15/2020    --------------------------------------------------------------------------------------------------  ASSESSMENT AND PLAN: Coronary artery disease: No symptoms to suggest  worsening coronary insufficiency.  Initial event was caused by spontaneous coronary artery dissection and aneurysmal segment of the ostial/proximal LAD.  This was complicated by ventricular fibrillation with anoxic brain injury and ischemic cardiomyopathy.  Continue current medications for secondary prevention, including aspirin and atorvastatin.  LDL well controlled.  Ischemic cardiomyopathy: Is also on appears euvolemic.  Assessment of functional status is limited due to her brain injury.  Most recent echo in 12/2019 showed normalization of LVEF.  Given soft blood pressures at times and recent mechanical fall (unclear if the patient was dizzy at the time), we have agreed to discontinue losartan.  Continue carvedilol 6.25 mg twice daily.  Anoxic brain injury and gastrostomy tube dysfunction: Ms. Schmutz family reports that she is very slowly improving.  They have concerns regarding her G-tube, including occasional bleeding at the site and some leakage around the tube itself.  It was placed by interventional radiology in 09/2019.  We will refer her to IR for evaluation of the tube.  Hyperlipidemia: Lipids well controlled on last check in  03/2020.  Continue high intensity statin therapy.  Follow-up: Return to clinic in 3 months.  Yvonne Kendall, MD 06/17/2020 8:36 AM

## 2020-06-16 NOTE — Patient Instructions (Addendum)
Medication Instructions:  - Your physician has recommended you make the following change in your medication:   1) STOP losartan  *If you need a refill on your cardiac medications before your next appointment, please call your pharmacy*   Lab Work: - none ordered  If you have labs (blood work) drawn today and your tests are completely normal, you will receive your results only by: Marland Kitchen MyChart Message (if you have MyChart) OR . A paper copy in the mail If you have any lab test that is abnormal or we need to change your treatment, we will call you to review the results.   Testing/Procedures: - none ordered   Follow-Up: At [facility], you and your health needs are our priority.  As part of our continuing mission to provide you with exceptional heart care, we have created designated Provider Care Teams.  These Care Teams include your primary Cardiologist (physician) and Advanced Practice Providers (APPs -  Physician Assistants and Nurse Practitioners) who all work together to provide you with the care you need, when you need it.  We recommend signing up for the patient portal called "MyChart".  Sign up information is provided on this After Visit Summary.  MyChart is used to connect with patients for Virtual Visits (Telemedicine).  Patients are able to view lab/test results, encounter notes, upcoming appointments, etc.  Non-urgent messages can be sent to your provider as well.   To learn more about what you can do with MyChart, go to [email or web address].    Your next appointment:   3 month(s)  The format for your next appointment:   In Person  Provider:   You may see Yvonne Kendall, MD or one of the following Advanced Practice Providers on your designated Care Team:    Nicolasa Ducking, NP  Eula Listen, PA-C  Marisue Ivan, PA-C  Cadence Sharon, [place]  Gillian Shields, NP    Other Instructions  1) We will refer you to Interventional Radiology for evaluation of  possible G-Tube malfunction - their department should be in touch with you directly to schedule - if you have not heard from them in the next 1-2 weeks, please call us back at the office so we may follow up on this

## 2020-06-17 ENCOUNTER — Encounter: Payer: Self-pay | Admitting: Internal Medicine

## 2020-06-22 ENCOUNTER — Other Ambulatory Visit: Payer: Self-pay | Admitting: Physical Medicine & Rehabilitation

## 2020-06-22 ENCOUNTER — Other Ambulatory Visit: Payer: Self-pay | Admitting: Family

## 2020-06-22 DIAGNOSIS — I5022 Chronic systolic (congestive) heart failure: Secondary | ICD-10-CM

## 2020-06-28 ENCOUNTER — Other Ambulatory Visit: Payer: Self-pay | Admitting: Cardiovascular Disease

## 2020-06-28 ENCOUNTER — Other Ambulatory Visit: Payer: Self-pay | Admitting: Internal Medicine

## 2020-06-28 ENCOUNTER — Telehealth: Payer: Self-pay | Admitting: Internal Medicine

## 2020-06-28 DIAGNOSIS — Z09 Encounter for follow-up examination after completed treatment for conditions other than malignant neoplasm: Secondary | ICD-10-CM

## 2020-06-28 NOTE — Telephone Encounter (Addendum)
Spoke with Beverly [name] @ [facility] short stay scheduling and Dr. Rocky Link with Interventional Radiology. Per Dr. Rocky Link an order needs to be placed for the evaluation with possible exchange. Order number [id number] placed, instructed to place in the comments g-tube injection with evaluation.  Beverly [name] sts that the Radiology nurse will call the patients mother Beverly [name] in the morning to schedule for possibly this Wed 06/30/20. Beverly [name] that I would call Beverly [name] to give her an update.  Called the patients mother Beverly [name] and made her aware of the above. She will expect the call tomorrow.  Beverly [name] sts that they already have an appt this Wed. Adv her to discuss alternative dates when the nurse calls her tomorrow. Provided her the short stay telephone number as well.  Beverly [name] voiced appreciation for the assistance.

## 2020-06-28 NOTE — Telephone Encounter (Signed)
Mother is calling to get status of referral for Interventional Radiology for G-tube. Lease call to discuss.

## 2020-06-30 ENCOUNTER — Ambulatory Visit: Admission: RE | Admit: 2020-06-30 | Payer: Medicaid Other | Source: Ambulatory Visit

## 2020-07-02 ENCOUNTER — Ambulatory Visit
Admission: RE | Admit: 2020-07-02 | Discharge: 2020-07-02 | Disposition: A | Discharge status: Home / Self Care | Payer: Medicaid Other | Source: Ambulatory Visit | Attending: Internal Medicine | Admitting: Internal Medicine

## 2020-07-02 ENCOUNTER — Other Ambulatory Visit: Payer: Self-pay

## 2020-07-02 ENCOUNTER — Ambulatory Visit: Payer: Medicaid Other

## 2020-07-02 DIAGNOSIS — Z09 Encounter for follow-up examination after completed treatment for conditions other than malignant neoplasm: Secondary | ICD-10-CM

## 2020-07-02 DIAGNOSIS — Z431 Encounter for attention to gastrostomy: Secondary | ICD-10-CM | POA: Insufficient documentation

## 2020-07-02 HISTORY — PX: IR GASTROSTOMY TUBE REMOVAL: IMG5492

## 2020-07-02 NOTE — Progress Notes (Signed)
Interventional Radiology Brief Note:  Patient brought to IR today for gastrostomy tube evaluation.  Mother states she is concerned due to leakage with a small amount of blood from around the tube.  PA to bedside for assessment.  Gastrostomy tube appears in good position. Small granuloma present at 10 o'clock position which appears to be the source of the scant amount of bleeding.  The bumper is pulled back from the stomach by several inches.  Cleaned granuloma without any further signs of bleeding.  Recinched gastrostomy tube.  Flushed tube without any issues, leakage, or resistance.  Care instructions provided to mom.  No further needs identified today.   Loyce Dys, MS RD PA-C 11:37 AM

## 2020-07-11 ENCOUNTER — Other Ambulatory Visit: Payer: Self-pay | Admitting: Physical Medicine & Rehabilitation

## 2020-07-14 ENCOUNTER — Encounter: Payer: Medicaid Other | Attending: Physical Medicine & Rehabilitation | Admitting: Physical Medicine & Rehabilitation

## 2020-07-14 ENCOUNTER — Encounter: Payer: Self-pay | Admitting: Physical Medicine & Rehabilitation

## 2020-07-14 ENCOUNTER — Other Ambulatory Visit: Payer: Self-pay

## 2020-07-14 VITALS — BP 117/80 | HR 77 | Temp 98.2°F | Ht 69.0 in | Wt 154.4 lb

## 2020-07-14 DIAGNOSIS — G479 Sleep disorder, unspecified: Secondary | ICD-10-CM

## 2020-07-14 DIAGNOSIS — G931 Anoxic brain damage, not elsewhere classified: Secondary | ICD-10-CM | POA: Diagnosis not present

## 2020-07-14 DIAGNOSIS — R269 Unspecified abnormalities of gait and mobility: Secondary | ICD-10-CM | POA: Diagnosis present

## 2020-07-14 MED ORDER — TRAZODONE HCL 50 MG PO TABS: 50.0000 mg | ORAL_TABLET | Freq: Every evening | ORAL | 2 refills | Status: DC | PRN

## 2020-07-14 MED ORDER — METHYLPHENIDATE HCL 10 MG PO TABS: 10.0000 mg | ORAL_TABLET | Freq: Two times a day (BID) | ORAL | 0 refills | Status: DC

## 2020-07-14 NOTE — Patient Instructions (Signed)
TAKE 5MG  (1/2 TAB) RITALIN AT BREAKFAST AND LUNCH FOR 4 DAYS, THEN INCREASE TO FULL 10MG  TABLET.    CHECK WITH [facility] SPEECH PROGRAM ABOUT ANY CHARITY THERAPY OFFERINGS.

## 2020-07-14 NOTE — Progress Notes (Signed)
Subjective:    Patient ID: Beverly [name], female    DOB: Jun 05, 1985, 35 y.o.   MRN: [id number]  HPI   This is a follow-up visit for Beverly [name] who is here in regards to her anoxic brain injury. She was last seen by Dr. Allena Katz.  She is still receiving therapies through [facility] (PT) . She is not currently receiving SLP  Her memory remains very poor. Affect can be very flat and doesn't have much energy. She is slow to initiate conversations. She struggles to remember her children's name. Speech remains dysarthric  She sleeps typically from 9pm to 10-11am most mornings.   She is now eating regular food and typically 2-3 meals per day. Her PEG is still in place.   She is able to dress with cues. She may indicate that she needs to void but needs help getting to the bathroom and cleaning up after herself. She needs assistance bathing and with balance/walking in general. She uses a RW for ambulation.   She is getting along with family better and less agitated in general.   Beverly [name] fell against a door knob in May when she lost balance after impulsively getting up. She suffered a couple black eyes.    Pain Inventory Average Pain 0 Pain Right Now 0 My pain is no pain  LOCATION OF PAIN  No pain  BOWEL Number of stools per week: 5 Oral laxative use No  Type of laxative n/a Enema or suppository use No  History of colostomy No  Incontinent Yes   BLADDER Normal In and out cath, frequency  Able to self cath No  Bladder incontinence Yes  Frequent urination No  Leakage with coughing No  Difficulty starting stream No  Incomplete bladder emptying No    Mobility walk with assistance use a walker how many minutes can you walk? 3-5 mins ability to climb steps?  yes do you drive?  yes  Function not employed: date last employed 09/23/19 disabled: date disabled 09/23/2019  Neuro/Psych trouble walking spasms confusion  Prior Studies n/a  Physicians involved in your  care n/a   Family History  Problem Relation Age of Onset  . Hyperlipidemia Mother   . Hypertension Mother   . Diabetes Maternal Grandmother   . Seizures Maternal Grandmother   . Thyroid disease Maternal Grandmother   . Diabetes Paternal Grandmother   . Rashes / Skin problems Son        ezcema  . Seizures Son    Social History   Socioeconomic History  . Marital status: Single    Spouse name: Not on file  . Number of children: 3  . Years of education: Not on file  . Highest education level: Not on file  Occupational History  . Occupation: oncology    Employer: [employer]    Comment: NA  Tobacco Use  . Smoking status: Never Smoker  . Smokeless tobacco: Never Used  Vaping Use  . Vaping Use: Never used  Substance and Sexual Activity  . Alcohol use: Not Currently    Alcohol/week: 0.0 standard drinks    Comment: quit 2017  . Drug use: No    Frequency: 5.0 times per week    Types: Marijuana    Comment: stopped when she found out she was pregnant  . Sexual activity: Not Currently    Partners: Male    Birth control/protection: None  Other Topics Concern  . Not on file  Social History Narrative   01/06/20 lives  with mom and her children   Social Determinants of Health   Financial Resource Strain: Not on file  Food Insecurity: Not on file  Transportation Needs: Not on file  Physical Activity: Not on file  Stress: Not on file  Social Connections: Not on file   Past Surgical History:  Procedure Laterality Date  . IR GASTROSTOMY TUBE MOD SED  10/07/2019  . IR GASTROSTOMY TUBE REMOVAL  07/02/2020  . LEFT HEART CATH AND CORONARY ANGIOGRAPHY N/A 09/23/2019   Procedure: LEFT HEART CATH AND CORONARY ANGIOGRAPHY;  Surgeon: Yvonne Kendall, MD;  Location: [facility] INVASIVE CV LAB;  Service: Cardiovascular;  Laterality: N/A;   Past Medical History:  Diagnosis Date  . Brain injury (HCC)   . CAD (coronary artery disease)   . HLD (hyperlipidemia)   . Hypertension    last  pregnancy  . Ischemic cardiomyopathy   . STEMI (ST elevation myocardial infarction) (HCC) 09/2019   SCAD with aneurysmal dilation of proximal LAD.  . Vaginal Pap smear, abnormal    when she was 35yo  . Ventricular fibrillation (HCC) 09/23/2019   BP 117/80   Pulse 77   Temp 98.2 F (36.8 C)   Ht 5\' 9"  (1.753 m)   Wt 154 lb 6.4 oz (70 kg)   SpO2 98%   BMI 22.80 kg/m   Opioid Risk Score:   Fall Risk Score:  `1  Depression screen PHQ 2/9  Depression screen [facility] 2/9 04/15/2020 03/16/2020 02/12/2020  Decreased Interest 0 0 2  Down, Depressed, Hopeless 0 0 0  PHQ - 2 Score 0 0 2  Altered sleeping - - 2  Tired, decreased energy - - 1  Change in appetite - - 3  Feeling bad or failure about yourself  - - 0  Trouble concentrating - - 3  Moving slowly or fidgety/restless - - 3  Suicidal thoughts - - 0  PHQ-9 Score - - 14  Difficult doing work/chores - - Extremely dIfficult     Review of Systems  Constitutional: Positive for unexpected weight change.       Weight loss  HENT: Negative.   Eyes: Negative.   Respiratory: Negative.   Cardiovascular: Negative.   Gastrointestinal: Negative.   Endocrine: Negative.   Genitourinary: Negative.   Musculoskeletal: Positive for gait problem.       Spasm  Skin: Negative.   Allergic/Immunologic: Negative.   Neurological: Positive for speech difficulty.  Hematological: Negative.   Psychiatric/Behavioral: Negative.        Objective:   Physical Exam Physical Exam Constitutional: No distress . Vital signs reviewed. HEENT: EOMI, oral membranes moist Neck: supple Cardiovascular: RRR without murmur. No JVD    Respiratory/Chest: CTA Bilaterally without wheezes or rales. Normal effort    GI/Abdomen: BS +, non-tender, non-distended, PEG+ Ext: no clubbing, cyanosis, or edema Psych: pleasant and cooperative Skin: Warm and dry.  Intact. Psych:very flat.  Musc: No edema in extremities.  No tenderness in extremities. Neuro: Alert and oriented  to person, place, month, year with cues Dysarthric Motor: vision intact. grossly 5/5 throughout,  motor apraxia Delayed processing. Gait wide based but can go into scissoring. Struggles with change of directions. No sensory deficits or increased tone.        Assessment & Plan:  1. Anoxic BI - memory biggest complaint             MRI/CTs unremarkable             -ritalin trial 10mg  bid, starting at 5mg   bid initially   -she doesn't appear depressed  -asked Mom to reach out [facility] to see if they do any charity speech therapy. i'm not aware of other options for SLP   -encouraged mom to engage her cognitively as possible 2. Dysphagia:  -tolerating regular food             Cont PEG as a back up for now                3. Gait abnormality             Cont ambulation with assistance    RW for balance  PT with [facility] 3. Sleep disturbance             Melatonin 3 mg daily --can continue             Discussed sleep hygiene and routine  -trazodone at night time as needed for insomnia  Thirty minutes of face to face patient care time were spent during this visit. All questions were encouraged and answered. Follow up with me in 2 mos.

## 2020-07-15 ENCOUNTER — Telehealth: Payer: Self-pay

## 2020-07-15 NOTE — Telephone Encounter (Signed)
Call back phone [phone], Mrs. Aranza Geddes mother Mrs. Hanner called:   Pharmacy has advised not taking Ritalin because of Beverly [name]'s heart condition. Patient has history of Cardiac Arrest.    Should she restart Amantadine 50 MG? Or what should she do?  Please advise.

## 2020-07-16 ENCOUNTER — Telehealth: Payer: Self-pay | Admitting: Family

## 2020-07-16 NOTE — Telephone Encounter (Signed)
Ive place call to cardiology and have spoken to mom. Await return call. For now I asked mom to hold on until I talk with her again.

## 2020-07-16 NOTE — Telephone Encounter (Signed)
Please call Dr. Ivory Broad [phone] wanting to discuss mutual patient

## 2020-07-21 ENCOUNTER — Other Ambulatory Visit: Payer: Self-pay | Admitting: Family

## 2020-07-21 DIAGNOSIS — I5022 Chronic systolic (congestive) heart failure: Secondary | ICD-10-CM

## 2020-07-26 NOTE — Telephone Encounter (Signed)
Left message at number provided for Dr. Riley Kill to call me back to discuss Ms. Raul Del.  Yvonne Kendall, MD [facility]

## 2020-07-26 NOTE — Telephone Encounter (Signed)
I have spoken with Dr. Riley Kill regarding potential risks of methylphenidate therapy in Ms. Beverly [name].  We have agreed to defer trial of this therapy.  Yvonne Kendall, MD [facility]

## 2020-08-01 ENCOUNTER — Other Ambulatory Visit: Payer: Self-pay | Admitting: Internal Medicine

## 2020-08-05 ENCOUNTER — Other Ambulatory Visit: Payer: Self-pay | Admitting: Physical Medicine & Rehabilitation

## 2020-08-05 DIAGNOSIS — G479 Sleep disorder, unspecified: Secondary | ICD-10-CM

## 2020-08-17 ENCOUNTER — Other Ambulatory Visit: Payer: Self-pay | Admitting: Physical Medicine & Rehabilitation

## 2020-08-17 ENCOUNTER — Other Ambulatory Visit: Payer: Self-pay | Admitting: Family

## 2020-08-17 DIAGNOSIS — I5022 Chronic systolic (congestive) heart failure: Secondary | ICD-10-CM

## 2020-08-17 DIAGNOSIS — G479 Sleep disorder, unspecified: Secondary | ICD-10-CM

## 2020-09-08 ENCOUNTER — Encounter: Payer: Self-pay | Admitting: Physical Medicine & Rehabilitation

## 2020-09-08 ENCOUNTER — Encounter: Payer: Medicaid Other | Attending: Physical Medicine & Rehabilitation | Admitting: Physical Medicine & Rehabilitation

## 2020-09-08 ENCOUNTER — Other Ambulatory Visit: Payer: Self-pay

## 2020-09-08 VITALS — BP 112/73 | HR 90 | Temp 98.6°F | Ht 69.0 in | Wt 152.0 lb

## 2020-09-08 DIAGNOSIS — R1312 Dysphagia, oropharyngeal phase: Secondary | ICD-10-CM | POA: Insufficient documentation

## 2020-09-08 DIAGNOSIS — R269 Unspecified abnormalities of gait and mobility: Secondary | ICD-10-CM

## 2020-09-08 DIAGNOSIS — G931 Anoxic brain damage, not elsewhere classified: Secondary | ICD-10-CM

## 2020-09-08 MED ORDER — AMANTADINE HCL 50 MG/5ML PO SOLN: 100.0000 mg | Freq: Two times a day (BID) | ORAL | 5 refills | Status: DC

## 2020-09-08 NOTE — Patient Instructions (Addendum)
PLEASE FEEL FREE TO CALL OUR OFFICE WITH ANY PROBLEMS OR QUESTIONS [phone])                  AMANTADINE 10MG / 1CC  YOU WANT TO GIVE 15CC IN THE MORNING AND AT LUNCHTIME.   KEEP AN EYE ON BLOOD PRESSURE AND PULSE

## 2020-09-08 NOTE — Progress Notes (Signed)
Subjective:    Patient ID: Beverly [name], female    DOB: Oct 18, 1985, 35 y.o.   MRN: [id number]  HPI  Beverly [name] is here in follow-up of her anoxic brain injury.  I last saw her 2 months ago.  We ultimately did not start Ritalin given the cardiovascular risk after I was able discussed with her cardiologist.  She continues on amantadine which we believe is 100 mg twice daily.  Mom has noted that with the scheduling of her trazodone that she is much more alert the following day.  She has been eating more up until recently as well.  The last 3 to 4 days she is struggled more to swallow and has been holding things in her mouth.  Mom still gives medications through her PEG given the fact that swallowing is not always reliable.  She had some physical therapy through [facility] which is wrapping up.  She will start speech therapy at [facility] in the fall.  Mom is still disappointed that her memory is poor and that she still needs significant help from a cognitive standpoint.  She is using a walker for balance and doing fairly well with that although can be impulsive and tries to walk without.  Mom denies any falls at home.   Pain Inventory Average Pain 0 Pain Right Now 0 My pain is  no pain  LOCATION OF PAIN  no pain  BOWEL Number of stools per week: 4-5 Oral laxative use No  Type of laxative none Enema or suppository use No  History of colostomy No  Incontinent No   BLADDER Normal and Pads In and out cath, frequency  Able to self cath no Bladder incontinence Yes  Frequent urination No  Leakage with coughing No  Difficulty starting stream No  Incomplete bladder emptying No    Mobility use a walker how many minutes can you walk? Maybe 5-6 mins ability to climb steps?  yes do you drive?  no Do you have any goals in this area?  yes  Function disabled: date disabled 06/2020 I need assistance with the following:  feeding, dressing, bathing, toileting, meal prep, household duties,  and shopping Do you have any goals in this area?  yes  Neuro/Psych bladder control problems weakness tremor trouble walking  Prior Studies Any changes since last visit?  no  Physicians involved in your care Any changes since last visit?  no   Family History  Problem Relation Age of Onset   Hyperlipidemia Mother    Hypertension Mother    Diabetes Maternal Grandmother    Seizures Maternal Grandmother    Thyroid disease Maternal Grandmother    Diabetes Paternal Grandmother    Rashes / Skin problems Son        ezcema   Seizures Son    Social History   Socioeconomic History   Marital status: Single    Spouse name: Not on file   Number of children: 3   Years of education: Not on file   Highest education level: Not on file  Occupational History   Occupation: oncology    Employer: [employer]    Comment: NA  Tobacco Use   Smoking status: Never   Smokeless tobacco: Never  Vaping Use   Vaping Use: Never used  Substance and Sexual Activity   Alcohol use: Not Currently    Alcohol/week: 0.0 standard drinks    Comment: quit 2017   Drug use: No    Frequency: 5.0 times per  week    Types: Marijuana    Comment: stopped when she found out she was pregnant   Sexual activity: Not Currently    Partners: Male    Birth control/protection: None  Other Topics Concern   Not on file  Social History Narrative   01/06/20 lives with mom and her children   Social Determinants of Health   Financial Resource Strain: Not on file  Food Insecurity: Not on file  Transportation Needs: Not on file  Physical Activity: Not on file  Stress: Not on file  Social Connections: Not on file   Past Surgical History:  Procedure Laterality Date   IR GASTROSTOMY TUBE MOD SED  10/07/2019   IR GASTROSTOMY TUBE REMOVAL  07/02/2020   LEFT HEART CATH AND CORONARY ANGIOGRAPHY N/A 09/23/2019   Procedure: LEFT HEART CATH AND CORONARY ANGIOGRAPHY;  Surgeon: Yvonne Kendall, MD;  Location: [facility] INVASIVE CV  LAB;  Service: Cardiovascular;  Laterality: N/A;   Past Medical History:  Diagnosis Date   Brain injury (HCC)    CAD (coronary artery disease)    HLD (hyperlipidemia)    Hypertension    last pregnancy   Ischemic cardiomyopathy    STEMI (ST elevation myocardial infarction) (HCC) 09/2019   SCAD with aneurysmal dilation of proximal LAD.   Vaginal Pap smear, abnormal    when she was 35yo   Ventricular fibrillation (HCC) 09/23/2019   Pulse 90   Ht 5\' 9"  (1.753 m)   Wt 152 lb (68.9 kg)   SpO2 97%   BMI 22.45 kg/m   Opioid Risk Score:   Fall Risk Score:  `1  Depression screen PHQ 2/9  Depression screen [facility] 2/9 07/14/2020 04/15/2020 03/16/2020 02/12/2020  Decreased Interest 0 0 0 2  Down, Depressed, Hopeless 0 0 0 0  PHQ - 2 Score 0 0 0 2  Altered sleeping 0 - - 2  Tired, decreased energy - - - 1  Change in appetite - - - 3  Feeling bad or failure about yourself  - - - 0  Trouble concentrating - - - 3  Moving slowly or fidgety/restless - - - 3  Suicidal thoughts - - - 0  PHQ-9 Score 0 - - 14  Difficult doing work/chores - - - Extremely dIfficult    Review of Systems  Musculoskeletal:  Positive for gait problem.  Neurological:  Positive for speech difficulty and weakness.  All other systems reviewed and are negative.     Objective:   Physical Exam  Constitutional: No distress . Vital signs reviewed. HEENT: NCAT, EOMI, oral membranes moist Neck: supple Cardiovascular: RRR without murmur. No JVD    Respiratory/Chest: CTA Bilaterally without wheezes or rales. Normal effort    GI/Abdomen: BS +, non-tender, non-distended Ext: no clubbing, cyanosis, or edema Psych: pleasant and cooperative. Much more animated today Skin: Warm and dry.  Intact.    Musc: No edema in extremities.  No tenderness in extremities. Neuro: Alert to person, not place, date. Recognizes mom. Able to answer some simple questions with cues. Remains very delayed in processing but as a whole very much more  alert today, more attentive. Can be perseverative Dysarthric Motor: vision intact. grossly 5/5 throughout,  motor apraxia Delayed processing. Gait wide based no scissoring today.   . No sensory deficits or increased tone.            Assessment & Plan:  1. Anoxic BI - substantial attention, initiation deficits as well as memory deficits  MRI/CTs unremarkable             -given CV risk, we won't try ritalin  -increase amantadine to 150mg  bid at 7am and 12n             -she doesn't appear depressed             -SLP follow up at [facility]              -encouraged mom to continue engaging her cognitively as possible  -consider bromocriptine trial 2. Dysphagia:             -tolerating regular food--need to continue to encourage to eat and to take meds by mouth. It will take time given her processing deficits/apraxia             Cont PEG as a back up for now                3. Gait abnormality             Cont ambulation with assistance               RW for balance             PT with [facility] again this fall 3. Sleep disturbance             Melatonin 3 mg daily --can take with trazodone            Discussed sleep hygiene and routine             -trazodone at night time scheduled.  We discussed the trazodone affect and the only thing that I can think of is that she may be sleeping better with it and then consequently more alert following day because of her improved sleep.   15 minutes of face to face patient care time were spent during this visit. All questions were encouraged and answered. Follow up with me in 2 mos. asked mom to call for an update in 2-3 weeks.

## 2020-09-15 ENCOUNTER — Other Ambulatory Visit: Payer: Self-pay | Admitting: Physical Medicine & Rehabilitation

## 2020-09-15 DIAGNOSIS — G479 Sleep disorder, unspecified: Secondary | ICD-10-CM

## 2020-09-22 ENCOUNTER — Other Ambulatory Visit: Payer: Self-pay

## 2020-09-22 ENCOUNTER — Encounter: Payer: Self-pay | Admitting: Internal Medicine

## 2020-09-22 ENCOUNTER — Ambulatory Visit (INDEPENDENT_AMBULATORY_CARE_PROVIDER_SITE_OTHER): Payer: Medicaid Other | Admitting: Internal Medicine

## 2020-09-22 VITALS — BP 86/64 | HR 83 | Ht 69.0 in | Wt 151.0 lb

## 2020-09-22 DIAGNOSIS — I4901 Ventricular fibrillation: Secondary | ICD-10-CM | POA: Diagnosis not present

## 2020-09-22 DIAGNOSIS — I2510 Atherosclerotic heart disease of native coronary artery without angina pectoris: Secondary | ICD-10-CM

## 2020-09-22 DIAGNOSIS — I5041 Acute combined systolic (congestive) and diastolic (congestive) heart failure: Secondary | ICD-10-CM

## 2020-09-22 MED ORDER — CARVEDILOL 6.25 MG PO TABS: 3.1250 mg | ORAL_TABLET | Freq: Two times a day (BID) | ORAL | 0 refills | Status: DC

## 2020-09-22 MED ORDER — ATORVASTATIN CALCIUM 80 MG PO TABS: 40.0000 mg | ORAL_TABLET | Freq: Every day | ORAL | 0 refills | Status: DC

## 2020-09-22 NOTE — Patient Instructions (Addendum)
Medication Instructions:   Your physician has recommended you make the following change in your medication:   STOP Pantoprazole (Protonix)  DECREASE Carvedilol to 3.125 mg TWICE daily - you may cut your pills in half   DECREASE Atorvastatin to 40 mg daily - you may cut your pills in half   *If you need a refill on your cardiac medications before your next appointment, please call your pharmacy*   Lab Work:  None ordered  Testing/Procedures:  None ordered   Follow-Up: At [facility], you and your health needs are our priority.  As part of our continuing mission to provide you with exceptional heart care, we have created designated Provider Care Teams.  These Care Teams include your primary Cardiologist (physician) and Advanced Practice Providers (APPs -  Physician Assistants and Nurse Practitioners) who all work together to provide you with the care you need, when you need it.  We recommend signing up for the patient portal called "MyChart".  Sign up information is provided on this After Visit Summary.  MyChart is used to connect with patients for Virtual Visits (Telemedicine).  Patients are able to view lab/test results, encounter notes, upcoming appointments, etc.  Non-urgent messages can be sent to your provider as well.   To learn more about what you can do with MyChart, go to [email or web address].    Your next appointment:   3 month(s)  The format for your next appointment:   In Person  Provider:   You may see Yvonne Kendall, MD or one of the following Advanced Practice Providers on your designated Care Team:   Nicolasa Ducking, NP Eula Listen, PA-C Marisue Ivan, PA-C Cadence Fransico Michael, PA-Cb

## 2020-09-22 NOTE — Progress Notes (Signed)
Follow-up Outpatient Visit Date: 09/22/2020  Primary Care Provider: Santiago Bur, PA [address] [place] [address]  Chief Complaint: Follow-up coronary artery disease, ischemic cardiomyopathy, and cardiac arrest complicated by anoxic brain injury  HPI:  Beverly [name] is a 35 y.o. 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  I last saw her in May, at which time her family reported that she was beginning to eat a little bit better.  They were concerned about leakage and bleeding around her G-tube.  G-tube was adjusted by interventional radiology.  Patient also had a mechanical fall prior to our last visit with question of dizziness.  In the setting of soft blood pressure, we agreed to discontinue losartan at that time.  Today, Beverly [name] returns with her mother.  Overall, she has been feeling relatively well though she has suffered another fall in the last few weeks.  She does not complain of frank lightheadedness/dizziness nor chest pain, shortness of breath, or palpitations, though she does have a hard time verbalizing.  Blood pressures have been borderline low at times when working with therapy.  --------------------------------------------------------------------------------------------------  Past Medical History:  Diagnosis Date   Brain injury (HCC)    CAD (coronary artery disease)    HLD (hyperlipidemia)    Hypertension    last pregnancy   Ischemic cardiomyopathy    STEMI (ST elevation myocardial infarction) (HCC) 09/2019   SCAD with aneurysmal dilation of proximal LAD.   Vaginal Pap smear, abnormal    when she was 35yo   Ventricular fibrillation (HCC) 09/23/2019   Past Surgical History:  Procedure Laterality Date    CARDIAC CATHETERIZATION     IR GASTROSTOMY TUBE MOD SED  10/07/2019   IR GASTROSTOMY TUBE REMOVAL  07/02/2020   LEFT HEART CATH AND CORONARY ANGIOGRAPHY N/A 09/23/2019   Procedure: LEFT HEART CATH AND CORONARY ANGIOGRAPHY;  Surgeon: Yvonne Kendall, MD;  Location: [facility] INVASIVE CV LAB;  Service: Cardiovascular;  Laterality: N/A;    Current Meds  Medication Sig   acetaminophen (TYLENOL) 160 MG/5ML suspension PLACE 20.3 MLS INTO FEEDING TUBE EVERY FOUR HOURS AS NEEDED FOR MODERATE PAIN, HEADACHE OR FEVER.   amantadine (SYMMETREL) 50 MG/5ML solution 15 mL into feeding tube BID   aspirin 81 MG chewable tablet PLACE 1 TABLET (81 MG TOTAL) INTO FEEDING TUBE DAILY.   feeding supplement (ENSURE ENLIVE / ENSURE PLUS) LIQD Take 237 mLs by mouth 2 (two) times daily between meals.   melatonin 3 MG TABS tablet Place 1 tablet (3 mg total) into feeding tube at bedtime as needed (insomia/nocturnal agitation).   Prenatal Vit-Fe Fumarate-FA (PRENATAL MULTIVITAMIN) TABS tablet Place 1 tablet into feeding tube daily at 12 noon.   traZODone (DESYREL) 50 MG tablet TAKE 1 TABLET BY MOUTH EVERY DAY AT BEDTIME AS NEEDED FOR SLEEP   Water For Irrigation, Sterile (FREE WATER) SOLN Place 200 mLs into feeding tube every 6 (six) hours.   [DISCONTINUED] atorvastatin (LIPITOR) 80 MG tablet PLACE 1 TABLET (80 MG TOTAL) INTO FEEDING TUBE DAILY.   [DISCONTINUED] carvedilol (COREG) 6.25 MG tablet PLACE 1 TABLET (6.25 MG TOTAL) INTO FEEDING TUBE 2 (TWO) TIMES DAILY WITH A MEAL.   [DISCONTINUED] pantoprazole (PROTONIX) 40 MG tablet TAKE 1 TABLET (40 MG TOTAL) BY MOUTH DAILY.   Current Facility-Administered Medications for the 09/22/20  encounter (Office Visit) with Eyanna Mcgonagle, Cristal Deer, MD  Medication   iron polysaccharides (NIFEREX) capsule 150 mg    Allergies: Patient has no known allergies.  Social History   Tobacco Use   Smoking status: Never   Smokeless tobacco: Never  Vaping Use   Vaping Use: Never used  Substance Use  Topics   Alcohol use: Not Currently    Alcohol/week: 0.0 standard drinks    Comment: quit 2017   Drug use: No    Frequency: 5.0 times per week    Types: Marijuana    Comment: stopped when she found out she was pregnant    Family History  Problem Relation Age of Onset   Hyperlipidemia Mother    Hypertension Mother    Diabetes Maternal Grandmother    Seizures Maternal Grandmother    Thyroid disease Maternal Grandmother    Diabetes Paternal Grandmother    Rashes / Skin problems Son        ezcema   Seizures Son     Review of Systems: A 12-system review of systems was performed and was negative except as noted in the HPI.  --------------------------------------------------------------------------------------------------  Physical Exam: BP (!) 86/64 (BP Location: Right Arm, Patient Position: Sitting, Cuff Size: Normal)   Pulse 83   Ht 5\' 9"  (1.753 m)   Wt 151 lb (68.5 kg)   SpO2 98%   BMI 22.30 kg/m  Repeat blood pressure: 90/62  General:  NAD. Neck: No JVD or HJR. Lungs: Clear to auscultation bilaterally without wheezes or crackles. Heart: Regular rate and rhythm without murmurs, rubs, or gallops. Abdomen: Soft, nontender, nondistended. Extremities: No lower extremity edema.  EKG: Normal sinus rhythm with rightward axis and poor R wave progression.  Low voltage noted.  No significant change from prior tracing on 06/16/2020.  Lab Results  Component Value Date   WBC 8.5 03/15/2020   HGB 12.5 03/15/2020   HCT 38.4 03/15/2020   MCV 84 03/15/2020   PLT 537 (H) 03/15/2020    Lab Results  Component Value Date   NA 136 03/15/2020   K 4.7 03/15/2020   CL 99 03/15/2020   CO2 26 03/15/2020   BUN 10 03/15/2020   CREATININE 0.70 03/15/2020   GLUCOSE 104 (H) 03/15/2020   ALT 25 03/15/2020    Lab Results  Component Value Date   CHOL 107 03/15/2020   HDL 42 03/15/2020   LDLCALC 41 03/15/2020   LDLDIRECT 48 03/15/2020   TRIG 137 03/15/2020   CHOLHDL 2.5 03/15/2020     --------------------------------------------------------------------------------------------------  ASSESSMENT AND PLAN: Coronary artery disease: No angina reported in the setting of aneurysmal coronary arteries with SCAD of the ostial/proximal LAD a year ago.  Continue current medications for secondary prevention including aspirin and atorvastatin.  We will decrease atorvastatin to 40 mg daily given excellent LDL control and in an effort to minimize side effects.  Chronic HFrEF: LVEF low normal on most recent echo in 12/2019.  We previously discontinued losartan due to soft blood pressures and falls.  Blood pressure again borderline low today.  We will therefore decrease carvedilol to 3.125 mg twice daily.  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  This lesion was not intervened upon but has presumably healed.  I will reach out to EP to discuss indication for ICD placement for secondary prevention, given Ms. Waage's complicated history.  Hyperlipidemia: Lipids well controlled.  As above, decrease atorvastatin to 40 mg daily.  Anoxic brain  injury: Ongoing follow-up/management per Dr. Riley Kill (PM&R)  Follow-up: Return to clinic in 3 months.  Yvonne Kendall, MD 09/24/2020 8:04 PM

## 2020-09-24 ENCOUNTER — Encounter: Payer: Self-pay | Admitting: Internal Medicine

## 2020-09-29 ENCOUNTER — Telehealth: Payer: Self-pay | Admitting: Internal Medicine

## 2020-09-29 DIAGNOSIS — I4901 Ventricular fibrillation: Secondary | ICD-10-CM

## 2020-09-29 NOTE — Telephone Encounter (Signed)
I reviewed Beverly [name]'s case with Dr. Lalla Brothers after our office visit last week.  He thinks she may be a candidate for ICD placement for secondary prevention of ventricular fibrillation.  I have spoken with her mother, Jeralene Huff, who is interested in consultation with Dr. Lalla Brothers to explore ICD placement.  We will place a referral.  Beverly [name] also inquires about continued soft blood pressures as well as her daughter receiving adequate nutrition.  I have encouraged her to continue using Ensure as previously prescribed as well as to increase free water boluses if necessary to help make sure her daughter stays adequately hydrated.  We will defer medication changes today.  I also suggested that Ms. Hanner speak with Ms. Reinig's PCP and physiatrist about further nutritional therapy and possible dietitian consultation.  Yvonne Kendall, MD [facility]

## 2020-10-01 NOTE — Telephone Encounter (Signed)
Referral placed for EP with Dr. Lalla Brothers in [place] office for consideration of ICD for secondary prevention of ventricular fibrillation. Forwarding to scheduling.

## 2020-10-01 NOTE — Addendum Note (Signed)
Addended by: Lanny Hurst on: 10/01/2020 12:47 PM   Modules accepted: Orders

## 2020-10-01 NOTE — Telephone Encounter (Signed)
Attempted to schedule.  LMOV to call office.  ° °

## 2020-10-07 NOTE — Telephone Encounter (Signed)
Pt scheduled for consult with Dr. Lalla Brothers 11/03/20.

## 2020-11-03 ENCOUNTER — Ambulatory Visit (INDEPENDENT_AMBULATORY_CARE_PROVIDER_SITE_OTHER): Payer: Medicaid Other | Admitting: Cardiology

## 2020-11-03 ENCOUNTER — Other Ambulatory Visit: Payer: Self-pay

## 2020-11-03 VITALS — BP 102/64 | HR 85 | Ht 69.0 in | Wt 154.8 lb

## 2020-11-03 DIAGNOSIS — G931 Anoxic brain damage, not elsewhere classified: Secondary | ICD-10-CM | POA: Diagnosis not present

## 2020-11-03 DIAGNOSIS — I4901 Ventricular fibrillation: Secondary | ICD-10-CM

## 2020-11-03 DIAGNOSIS — I2510 Atherosclerotic heart disease of native coronary artery without angina pectoris: Secondary | ICD-10-CM | POA: Diagnosis not present

## 2020-11-03 DIAGNOSIS — I2542 Coronary artery dissection: Secondary | ICD-10-CM

## 2020-11-03 DIAGNOSIS — K9423 Gastrostomy malfunction: Secondary | ICD-10-CM

## 2020-11-03 NOTE — Patient Instructions (Addendum)
Medication Instructions:  Your physician recommends that you continue on your current medications as directed. Please refer to the Current Medication list given to you today. *If you need a refill on your cardiac medications before your next appointment, please call your pharmacy*  Lab Work: You will get lab work today:  BMP and CBC If you have labs (blood work) drawn today and your tests are completely normal, you will receive your results only by: MyChart Message (if you have MyChart) OR A paper copy in the mail If you have any lab test that is abnormal or we need to change your treatment, we will call you to review the results.  Testing/Procedures: Your physician has recommended that you have a defibrillator inserted. An implantable cardioverter defibrillator (ICD) is a small device that is placed in your chest or, in rare cases, your abdomen. This device uses electrical pulses or shocks to help control life-threatening, irregular heartbeats that could lead the heart to suddenly stop beating (sudden cardiac arrest). Leads are attached to the ICD that goes into your heart. This is done in the hospital and usually requires an overnight stay.   Follow-Up: At [facility], you and your health needs are our priority.  As part of our continuing mission to provide you with exceptional heart care, we have created designated Provider Care Teams.  These Care Teams include your primary Cardiologist (physician) and Advanced Practice Providers (APPs -  Physician Assistants and Nurse Practitioners) who all work together to provide you with the care you need, when you need it.  Your next appointment:   (implant 11/26/20)  You will follow up with the device clinic at the [facility] office 10-14 days after implant.  Your physician wants you to follow-up in: 91 days with Dr. Lalla Brothers   Subcutaneous Cardioverter Defibrillator Implantation A subcutaneous implantable cardioverter defibrillator  (S-ICD) is a device that identifies and corrects abnormal heart rhythms. Subcutaneous cardioverter defibrillator implantation is a surgery to place an S-ICD under the skin in your chest. An S-ICD has a battery, a small computer (pulse generator), and a device that moves electrical currents (electrode). The S-ICD detects and corrects two types of dangerous irregular heart rhythms (arrhythmias): A rapid heart rhythm in the lower chambers of the heart (ventricles). This is called ventricular tachycardia. The ventricles contracting in an uncoordinated way. This is called ventricular fibrillation. When the S-ICD detects ventricular tachycardia or fibrillation, it sends a shock to the heart that attempts to restore the heartbeat to normal (defibrillation). The shock may feel like a strong jolt in the chest. Your health care provider may recommend S-ICD implantation if: You had an arrhythmia that started in the ventricles. Your heart has damage from heart disease or a heart condition. Your heart muscle is weak. You have congenital heart disease. Your health care provider may recommend implantation of an S-ICD instead of a traditional ICD if: You have a blood vessel disorder (vascular disease). You have medical conditions that increase your risk for infection or you have had a prior ICD infection. You do not need a pacemaker. You are younger in age and are expected to have a long-term need for defibrillation. Talk with your health care provider about the benefits of an S-ICD. Tell a health care provider about: Any allergies you have. All medicines you are taking, including vitamins, herbs, eye drops, creams, and over-the-counter medicines. Any problems you or family members have had with anesthetic medicines. Any blood disorders you have. Any surgeries you  have had. Any medical conditions you have. Whether you are pregnant or may be pregnant. What are the risks? Generally, this is a safe procedure.  However, problems may occur, including: Infection. Bleeding. Allergic reactions to medicines or dyes. Failure to shock or correct the arrhythmia. Swelling and bruising. Blood clots. What happens before the procedure? Staying hydrated Follow instructions from your health care provider about hydration, which may include: Up to 2 hours before the procedure - you may continue to drink clear liquids, such as water, clear fruit juice, black coffee, and plain tea.  Eating and drinking restrictions Follow instructions from your health care provider about eating and drinking, which may include: 8 hours before the procedure - stop eating heavy meals or foods, such as meat, fried foods, or fatty foods. 6 hours before the procedure - stop eating light meals or foods, such as toast or cereal. 6 hours before the procedure - stop drinking milk or drinks that contain milk. 2 hours before the procedure - stop drinking clear liquids. Medicines Ask your health care provider about: Changing or stopping your regular medicines. This is especially important if you are taking diabetes medicines or blood thinners. Taking medicines such as aspirin and ibuprofen. These medicines can thin your blood. Do not take these medicines unless your health care provider tells you to take them. Taking over-the-counter medicines, vitamins, herbs, and supplements. Tests You may have tests, such as: Blood tests. A test to check the electrical signals in your heart (electrocardiogram, ECG). Imaging tests, such as a chest X-ray. General instructions Do not use any products that contain nicotine or tobacco for at least 4 weeks before the procedure. These products include cigarettes, chewing tobacco, and vaping devices, such as e-cigarettes. If you need help quitting, ask your health care provider. Ask your health care provider: How your procedure site will be marked. What steps will be taken to help prevent infection. These may  include: Removing hair at the surgery site. Washing skin with a germ-killing soap. Taking antibiotic medicine. Plan to have a responsible adult care for you for the time you are told after you leave the hospital or clinic. This is important. What happens during the procedure? Small monitors will be put on your body. They will be used to check your heart, blood pressure, and oxygen level. An IV will be inserted into one of your veins. You will be given one or more of the following: A medicine to help you relax (sedative). A medicine to numb the area (local anesthetic). A medicine to make you fall asleep (general anesthetic). A small incision will be made on the left side of your chest near your rib cage, under your arm. A pocket (pouch) will be made in the skin on the left lower part of your chest. The S-ICD pulse generator will be inserted into this pocket. An electrode will be placed under the skin in your chest, near your breastbone (sternum). The electrode will be attached to the S-ICD pulse generator. The S-ICD will be tested, and your health care provider will program the S-ICD for the condition being treated. Your incision will be closed with stitches (sutures) or glue and adhesive strips. A bandage (dressing) may be placed over your incision. The procedure may vary among health care providers and hospitals. What happens after the procedure? Your blood pressure, heart rate, breathing rate, and blood oxygen level will be monitored until you leave the hospital or clinic. You will be given pain medicine as needed. You  may have a chest X-ray to check the S-ICD. You will get an identification card explaining that you have an S-ICD. You will need to keep this card with you at all times. You will be given a remote home monitoring device to use with your S-ICD to allow your device to communicate with your clinic. Do not drive until your health care provider approves. Summary A subcutaneous  cardioverter defibrillator implantation is a type of surgery to implant a device that corrects dangerous irregular heart rhythms (arrhythmias). During the surgery, a subcutaneous implantable cardioverter defibrillator (S-ICD) is placed under the skin in the chest. The S-ICD electrode rests in the chest, but the electrode does not directly attach to the heart or blood vessels. This information is not intended to replace advice given to you by your health care provider. Make sure you discuss any questions you have with your health care provider. Document Revised: 07/23/2019 Document Reviewed: 07/23/2019 Elsevier Patient Education  2022 [employer].

## 2020-11-03 NOTE — H&P (View-Only) (Signed)
Electrophysiology Office Note:    Date:  11/03/2020   ID:  [name], DOB 09/25/1985, MRN [id number]  PCP:  Moore, Elizabeth, PA  [facility] Cardiologist:  Christopher End, MD  [facility] Electrophysiologist:  Kadarious Dikes T Cormick Moss, MD   Referring MD: End, Christopher, MD   Chief Complaint: VF  History of Present Illness:    Beverly [name] is a 35 y.o. female who presents for an evaluation of VF arrest at the request of Dr End. Their medical history includes SCAD involving the LAD complicated by VF arrest. Her hospital course was complicated by anoxic brain injury and an ischemic cardiomyopathy.   She presents to clinic today with her mom. Recently had her G-tube replaced due to leakage but that has improved significantly.   Past Medical History:  Diagnosis Date   Brain injury (HCC)    CAD (coronary artery disease)    HLD (hyperlipidemia)    Hypertension    last pregnancy   Ischemic cardiomyopathy    STEMI (ST elevation myocardial infarction) (HCC) 09/2019   SCAD with aneurysmal dilation of proximal LAD.   Vaginal Pap smear, abnormal    when she was 35yo   Ventricular fibrillation (HCC) 09/23/2019    Past Surgical History:  Procedure Laterality Date   CARDIAC CATHETERIZATION     IR GASTROSTOMY TUBE MOD SED  10/07/2019   IR GASTROSTOMY TUBE REMOVAL  07/02/2020   LEFT HEART CATH AND CORONARY ANGIOGRAPHY N/A 09/23/2019   Procedure: LEFT HEART CATH AND CORONARY ANGIOGRAPHY;  Surgeon: End, Christopher, MD;  Location: [facility] INVASIVE CV LAB;  Service: Cardiovascular;  Laterality: N/A;    Current Medications: Current Meds  Medication Sig   acetaminophen (TYLENOL) 160 MG/5ML suspension PLACE 20.3 MLS INTO FEEDING TUBE EVERY FOUR HOURS AS NEEDED FOR MODERATE PAIN, HEADACHE OR FEVER.   amantadine (SYMMETREL) 50 MG/5ML solution 15 mL into feeding tube BID   aspirin 81 MG chewable tablet PLACE 1 TABLET (81 MG TOTAL) INTO FEEDING TUBE DAILY.   atorvastatin (LIPITOR) 80  MG tablet Place 0.5 tablets (40 mg total) into feeding tube daily.   carvedilol (COREG) 6.25 MG tablet Place 0.5 tablets (3.125 mg total) into feeding tube 2 (two) times daily with a meal.   feeding supplement (ENSURE ENLIVE / ENSURE PLUS) LIQD Take 237 mLs by mouth 2 (two) times daily between meals.   melatonin 3 MG TABS tablet Place 1 tablet (3 mg total) into feeding tube at bedtime as needed (insomia/nocturnal agitation).   Prenatal Vit-Fe Fumarate-FA (PRENATAL MULTIVITAMIN) TABS tablet Place 1 tablet into feeding tube daily at 12 noon.   traZODone (DESYREL) 50 MG tablet TAKE 1 TABLET BY MOUTH EVERY DAY AT BEDTIME AS NEEDED FOR SLEEP   Water For Irrigation, Sterile (FREE WATER) SOLN Place 200 mLs into feeding tube every 6 (six) hours.   Current Facility-Administered Medications for the 11/03/20 encounter (Office Visit) with Sherrilynn Gudgel T, MD  Medication   iron polysaccharides (NIFEREX) capsule 150 mg     Allergies:   Patient has no known allergies.   Social History   Socioeconomic History   Marital status: Single    Spouse name: Not on file   Number of children: 3   Years of education: Not on file   Highest education level: Not on file  Occupational History   Occupation: oncology    Employer: [employer]    Comment: NA  Tobacco Use   Smoking status: Never   Smokeless tobacco: Never  Vaping Use     Vaping Use: Never used  Substance and Sexual Activity   Alcohol use: Not Currently    Alcohol/week: 0.0 standard drinks    Comment: quit 2017   Drug use: No    Frequency: 5.0 times per week    Types: Marijuana    Comment: stopped when she found out she was pregnant   Sexual activity: Not Currently    Partners: Male    Birth control/protection: None  Other Topics Concern   Not on file  Social History Narrative   01/06/20 lives with mom and her children   Social Determinants of Health   Financial Resource Strain: Not on file  Food Insecurity: Not on file   Transportation Needs: Not on file  Physical Activity: Not on file  Stress: Not on file  Social Connections: Not on file     Family History: The patient's family history includes Diabetes in her maternal grandmother and paternal grandmother; Hyperlipidemia in her mother; Hypertension in her mother; Rashes / Skin problems in her son; Seizures in her maternal grandmother and son; Thyroid disease in her maternal grandmother.  ROS:   Please see the history of present illness.    All other systems reviewed and are negative.  EKGs/Labs/Other Studies Reviewed:    The following studies were reviewed today: Prior notes  12/30/2019 echo personally reviewed LV function normal, 50-55%  09/23/2019 [facility] personally reviewed Conclusions: No coronary artery occlusion or critical stenosis. There is aneurysmal dilation of the ostial and proximal LAD with possible ulceration or dissection of uncertain chronicity.  TIMI-3 flow is noted throughout the LAD and its branches. No angiographically significant disease involving the LCx or RCA. Severely reduced left ventricular systolic function with mid/apical anterior, apical, and apical inferior hypokinesis/akinesis. Severely elevated left ventricular filling pressure.     EKG:  The ekg ordered today demonstrates sinus rhythm with low voltage QRS  Recent Labs: 11/23/2019: Magnesium 2.0; TSH 0.694 03/15/2020: ALT 25; BUN 10; Creatinine, Ser 0.70; Hemoglobin 12.5; Platelets 537; Potassium 4.7; Sodium 136  Recent Lipid Panel    Component Value Date/Time   CHOL 107 03/15/2020 1515   TRIG 137 03/15/2020 1515   HDL 42 03/15/2020 1515   CHOLHDL 2.5 03/15/2020 1515   CHOLHDL 4.6 09/26/2019 0841   VLDL 50 (H) 09/26/2019 0841   LDLCALC 41 03/15/2020 1515   LDLDIRECT 48 03/15/2020 1515    Physical Exam:    VS:  BP 102/64   Pulse 85   Ht 5' 9" (1.753 m)   Wt 154 lb 12.8 oz (70.2 kg)   SpO2 97%   BMI 22.86 kg/m     Wt Readings from Last 3  Encounters:  11/03/20 154 lb 12.8 oz (70.2 kg)  09/22/20 151 lb (68.5 kg)  09/08/20 152 lb (68.9 kg)     GEN: thin, in wheelchair. In no acute distress HEENT: Normal NECK: No JVD; No carotid bruits LYMPHATICS: No lymphadenopathy CARDIAC: RRR, no murmurs, rubs, gallops RESPIRATORY:  Clear to auscultation without rales, wheezing or rhonchi  ABDOMEN: Soft, non-tender, non-distended. G tube in place on lower stomach. MUSCULOSKELETAL:  No edema; No deformity  SKIN: Warm and dry NEUROLOGIC:  Alert and oriented x 3 PSYCHIATRIC:  Normal affect   ASSESSMENT:    1. Coronary artery dissection   2. Coronary artery disease involving native coronary artery of native heart without angina pectoris   3. Ventricular fibrillation (HCC)   4. Anoxic brain injury (HCC)   5. Gastrostomy tube dysfunction (HCC)    PLAN:      In order of problems listed above:  1. Coronary artery dissection 2. Coronary artery disease  Today discussed the pathophysiology of her coronary artery dissection. We discussed the possibility of future SCAD events which would predispose her to repeat ventricular arrhythmias. Discussed ICD technology and how it can be used in this situation. Discussed transvenous and subcutaneous ICD and the risks/recovery of each. She would like ot proceed with scheduling the sub-q ICD.  The patient has an ischemic CM, NYHA Class II CHF, and SCAD.  She is referred by Dr End for risk stratification of sudden death and consideration of ICD implantation.  At this time, she meets criteria for ICD implantation for secondary prevention of sudden death.  I have had a thorough discussion with the patient reviewing options.  The patient and their family (if available) have had opportunities to ask questions and have them answered. The patient and I hav2e decided together through a shared decision making process to proceed with subvcutaneous ICD at this time.   Risks, benefits, alternatives to ICD implantation  were discussed in detail with the patient today. The patient understands that the risks include but are not limited to bleeding, infection, pneumothorax, perforation, tamponade, vascular damage, renal failure, MI, stroke, death, inappropriate shocks, and lead dislodgement and wishes to proceed.  We will therefore schedule device implantation at the next available time.   3. Ventricular fibrillation (HCC) ICD as above.  4. Anoxic brain injury (HCC) Continue rehab.  5. Gastrostomy tube dysfunction (HCC) improved       Total time spent with patient today 65 minutes. This includes reviewing records, evaluating the patient and coordinating care.  Medication Adjustments/Labs and Tests Ordered: Current medicines are reviewed at length with the patient today.  Concerns regarding medicines are outlined above.  Orders Placed This Encounter  Procedures   Basic Metabolic Panel (BMET)   CBC w/Diff   EKG 12-Lead   No orders of the defined types were placed in this encounter.    Signed, Rossie Muskrat. Lalla Brothers, MD, [facility], [facility] 11/03/2020 9:33 PM    Electrophysiology [facility]

## 2020-11-03 NOTE — Progress Notes (Signed)
Electrophysiology Office Note:    Date:  11/03/2020   ID:  Beverly [name], DOB [birth year], MRN [id number]  PCP:  Santiago Bur, PA  [facility] Cardiologist:  Yvonne Kendall, MD  [facility] Electrophysiologist:  Lanier Prude, MD   Referring MD: Yvonne Kendall, MD   Chief Complaint: VF  History of Present Illness:    Beverly [name] is a 35 y.o. female who presents for an evaluation of VF arrest at the request of Dr End. Their medical history includes SCAD involving the LAD complicated by VF arrest. Her hospital course was complicated by anoxic brain injury and an ischemic cardiomyopathy.   She presents to clinic today with her mom. Recently had her G-tube replaced due to leakage but that has improved significantly.   Past Medical History:  Diagnosis Date   Brain injury [facility])    CAD (coronary artery disease)    HLD (hyperlipidemia)    Hypertension    last pregnancy   Ischemic cardiomyopathy    STEMI (ST elevation myocardial infarction) (HCC) 09/2019   SCAD with aneurysmal dilation of proximal LAD.   Vaginal Pap smear, abnormal    when she was 35yo   Ventricular fibrillation (HCC) 09/23/2019    Past Surgical History:  Procedure Laterality Date   CARDIAC CATHETERIZATION     IR GASTROSTOMY TUBE MOD SED  10/07/2019   IR GASTROSTOMY TUBE REMOVAL  07/02/2020   LEFT HEART CATH AND CORONARY ANGIOGRAPHY N/A 09/23/2019   Procedure: LEFT HEART CATH AND CORONARY ANGIOGRAPHY;  Surgeon: Yvonne Kendall, MD;  Location: [facility] INVASIVE CV LAB;  Service: Cardiovascular;  Laterality: N/A;    Current Medications: Current Meds  Medication Sig   acetaminophen (TYLENOL) 160 MG/5ML suspension PLACE 20.3 MLS INTO FEEDING TUBE EVERY FOUR HOURS AS NEEDED FOR MODERATE PAIN, HEADACHE OR FEVER.   amantadine (SYMMETREL) 50 MG/5ML solution 15 mL into feeding tube BID   aspirin 81 MG chewable tablet PLACE 1 TABLET (81 MG TOTAL) INTO FEEDING TUBE DAILY.   atorvastatin (LIPITOR) 80  MG tablet Place 0.5 tablets (40 mg total) into feeding tube daily.   carvedilol (COREG) 6.25 MG tablet Place 0.5 tablets (3.125 mg total) into feeding tube 2 (two) times daily with a meal.   feeding supplement (ENSURE ENLIVE / ENSURE PLUS) LIQD Take 237 mLs by mouth 2 (two) times daily between meals.   melatonin 3 MG TABS tablet Place 1 tablet (3 mg total) into feeding tube at bedtime as needed (insomia/nocturnal agitation).   Prenatal Vit-Fe Fumarate-FA (PRENATAL MULTIVITAMIN) TABS tablet Place 1 tablet into feeding tube daily at 12 noon.   traZODone (DESYREL) 50 MG tablet TAKE 1 TABLET BY MOUTH EVERY DAY AT BEDTIME AS NEEDED FOR SLEEP   Water For Irrigation, Sterile (FREE WATER) SOLN Place 200 mLs into feeding tube every 6 (six) hours.   Current Facility-Administered Medications for the 11/03/20 encounter (Office Visit) with Lanier Prude, MD  Medication   iron polysaccharides (NIFEREX) capsule 150 mg     Allergies:   Patient has no known allergies.   Social History   Socioeconomic History   Marital status: Single    Spouse name: Not on file   Number of children: 3   Years of education: Not on file   Highest education level: Not on file  Occupational History   Occupation: oncology    Employer: [employer]    Comment: NA  Tobacco Use   Smoking status: Never   Smokeless tobacco: Never  Vaping Use  Vaping Use: Never used  Substance and Sexual Activity   Alcohol use: Not Currently    Alcohol/week: 0.0 standard drinks    Comment: quit 2017   Drug use: No    Frequency: 5.0 times per week    Types: Marijuana    Comment: stopped when she found out she was pregnant   Sexual activity: Not Currently    Partners: Male    Birth control/protection: None  Other Topics Concern   Not on file  Social History Narrative   01/06/20 lives with mom and her children   Social Determinants of Health   Financial Resource Strain: Not on file  Food Insecurity: Not on file   Transportation Needs: Not on file  Physical Activity: Not on file  Stress: Not on file  Social Connections: Not on file     Family History: The patient's family history includes Diabetes in her maternal grandmother and paternal grandmother; Hyperlipidemia in her mother; Hypertension in her mother; Rashes / Skin problems in her son; Seizures in her maternal grandmother and son; Thyroid disease in her maternal grandmother.  ROS:   Please see the history of present illness.    All other systems reviewed and are negative.  EKGs/Labs/Other Studies Reviewed:    The following studies were reviewed today: Prior notes  12/30/2019 echo personally reviewed LV function normal, 50-55%  09/23/2019 [facility] personally reviewed Conclusions: No coronary artery occlusion or critical stenosis. There is aneurysmal dilation of the ostial and proximal LAD with possible ulceration or dissection of uncertain chronicity.  TIMI-3 flow is noted throughout the LAD and its branches. No angiographically significant disease involving the LCx or RCA. Severely reduced left ventricular systolic function with mid/apical anterior, apical, and apical inferior hypokinesis/akinesis. Severely elevated left ventricular filling pressure.     EKG:  The ekg ordered today demonstrates sinus rhythm with low voltage QRS  Recent Labs: 11/23/2019: Magnesium 2.0; TSH 0.694 03/15/2020: ALT 25; BUN 10; Creatinine, Ser 0.70; Hemoglobin 12.5; Platelets 537; Potassium 4.7; Sodium 136  Recent Lipid Panel    Component Value Date/Time   CHOL 107 03/15/2020 1515   TRIG 137 03/15/2020 1515   HDL 42 03/15/2020 1515   CHOLHDL 2.5 03/15/2020 1515   CHOLHDL 4.6 09/26/2019 0841   VLDL 50 (H) 09/26/2019 0841   LDLCALC 41 03/15/2020 1515   LDLDIRECT 48 03/15/2020 1515    Physical Exam:    VS:  BP 102/64   Pulse 85   Ht 5\' 9"  (1.753 m)   Wt 154 lb 12.8 oz (70.2 kg)   SpO2 97%   BMI 22.86 kg/m     Wt Readings from Last 3  Encounters:  11/03/20 154 lb 12.8 oz (70.2 kg)  09/22/20 151 lb (68.5 kg)  09/08/20 152 lb (68.9 kg)     GEN: thin, in wheelchair. In no acute distress HEENT: Normal NECK: No JVD; No carotid bruits LYMPHATICS: No lymphadenopathy CARDIAC: RRR, no murmurs, rubs, gallops RESPIRATORY:  Clear to auscultation without rales, wheezing or rhonchi  ABDOMEN: Soft, non-tender, non-distended. G tube in place on lower stomach. MUSCULOSKELETAL:  No edema; No deformity  SKIN: Warm and dry NEUROLOGIC:  Alert and oriented x 3 PSYCHIATRIC:  Normal affect   ASSESSMENT:    1. Coronary artery dissection   2. Coronary artery disease involving native coronary artery of native heart without angina pectoris   3. Ventricular fibrillation (HCC)   4. Anoxic brain injury (HCC)   5. Gastrostomy tube dysfunction (HCC)    PLAN:  In order of problems listed above:  1. Coronary artery dissection 2. Coronary artery disease  Today discussed the pathophysiology of her coronary artery dissection. We discussed the possibility of future SCAD events which would predispose her to repeat ventricular arrhythmias. Discussed ICD technology and how it can be used in this situation. Discussed transvenous and subcutaneous ICD and the risks/recovery of each. She would like ot proceed with scheduling the sub-q ICD.  The patient has an ischemic CM, NYHA Class II CHF, and SCAD.  She is referred by Dr End for risk stratification of sudden death and consideration of ICD implantation.  At this time, she meets criteria for ICD implantation for secondary prevention of sudden death.  I have had a thorough discussion with the patient reviewing options.  The patient and their family (if available) have had opportunities to ask questions and have them answered. The patient and I hav2e decided together through a shared decision making process to proceed with subvcutaneous ICD at this time.   Risks, benefits, alternatives to ICD implantation  were discussed in detail with the patient today. The patient understands that the risks include but are not limited to bleeding, infection, pneumothorax, perforation, tamponade, vascular damage, renal failure, MI, stroke, death, inappropriate shocks, and lead dislodgement and wishes to proceed.  We will therefore schedule device implantation at the next available time.   3. Ventricular fibrillation (HCC) ICD as above.  4. Anoxic brain injury (HCC) Continue rehab.  5. Gastrostomy tube dysfunction (HCC) improved       Total time spent with patient today 65 minutes. This includes reviewing records, evaluating the patient and coordinating care.  Medication Adjustments/Labs and Tests Ordered: Current medicines are reviewed at length with the patient today.  Concerns regarding medicines are outlined above.  Orders Placed This Encounter  Procedures   Basic Metabolic Panel (BMET)   CBC w/Diff   EKG 12-Lead   No orders of the defined types were placed in this encounter.    Signed, Rossie Muskrat. Lalla Brothers, MD, [facility], [facility] 11/03/2020 9:33 PM    Electrophysiology [facility]

## 2020-11-04 LAB — CBC WITH DIFFERENTIAL/PLATELET
Basophils Absolute: 0 10*3/uL (ref 0.0–0.2)
Basos: 0 %
EOS (ABSOLUTE): 0.1 10*3/uL (ref 0.0–0.4)
Eos: 2 %
Hematocrit: 38.8 % (ref 34.0–46.6)
Hemoglobin: 13.1 g/dL (ref 11.1–15.9)
Immature Grans (Abs): 0 10*3/uL (ref 0.0–0.1)
Immature Granulocytes: 0 %
Lymphocytes Absolute: 2.6 10*3/uL (ref 0.7–3.1)
Lymphs: 45 %
MCH: 29.8 pg (ref 26.6–33.0)
MCHC: 33.8 g/dL (ref 31.5–35.7)
MCV: 88 fL (ref 79–97)
Monocytes Absolute: 0.5 10*3/uL (ref 0.1–0.9)
Monocytes: 9 %
Neutrophils Absolute: 2.5 10*3/uL (ref 1.4–7.0)
Neutrophils: 44 %
Platelets: 360 10*3/uL (ref 150–450)
RBC: 4.4 x10E6/uL (ref 3.77–5.28)
RDW: 13.2 % (ref 11.7–15.4)
WBC: 5.7 10*3/uL (ref 3.4–10.8)

## 2020-11-04 LAB — BASIC METABOLIC PANEL
BUN/Creatinine Ratio: 20 (ref 9–23)
BUN: 12 mg/dL (ref 6–20)
CO2: 20 mmol/L (ref 20–29)
Calcium: 9.6 mg/dL (ref 8.7–10.2)
Chloride: 101 mmol/L (ref 96–106)
Creatinine, Ser: 0.59 mg/dL (ref 0.57–1.00)
Glucose: 87 mg/dL (ref 70–99)
Potassium: 4.5 mmol/L (ref 3.5–5.2)
Sodium: 136 mmol/L (ref 134–144)
eGFR: 122 mL/min/{1.73_m2} (ref >59)

## 2020-11-24 ENCOUNTER — Encounter: Payer: Self-pay | Admitting: Physical Medicine & Rehabilitation

## 2020-11-24 ENCOUNTER — Other Ambulatory Visit: Payer: Self-pay

## 2020-11-24 ENCOUNTER — Encounter: Payer: Medicaid Other | Attending: Physical Medicine & Rehabilitation | Admitting: Physical Medicine & Rehabilitation

## 2020-11-24 VITALS — BP 102/64 | HR 82 | Temp 97.6°F | Ht 69.0 in | Wt 152.0 lb

## 2020-11-24 DIAGNOSIS — G931 Anoxic brain damage, not elsewhere classified: Secondary | ICD-10-CM | POA: Diagnosis present

## 2020-11-24 MED ORDER — AMANTADINE HCL 50 MG/5ML PO SOLN: 200.0000 mg | Freq: Two times a day (BID) | ORAL | 3 refills | Status: DC

## 2020-11-24 NOTE — Progress Notes (Signed)
Subjective:    Patient ID: Beverly [name], female    DOB: 1986/01/17, 35 y.o.   MRN: [id number]  HPI  Beverly [name] is here in follow-up of her ABI. She has received some SLP down in [place]. She is scheduled for ICD in the next couple weeks per cardiology.  Mom feels that trazodone is helping her sleep and that the increase in amantadine has made a difference at times but is inconsistent.  She notes that some days she can be very engaging and inquisitive whether days she is more apathetic and slow.  She still struggles with her eating much of anything.  She gives her nutrition and liquids primarily through her PEG.  Food walked to be stuck in her mouth.  She also will pull saliva as well.  Mom denies any depression.  Mom remains very engaged in her health and rehab.  They are struggling getting further speech therapy appointments due to her Medicaid which is now through [place] [facility].  Insurance company tells her that she has new visits while the therapy center at [facility] tells the she is out of visits.  Pain Inventory Average Pain 0 Pain Right Now 0 My pain is  no pain  LOCATION OF PAIN  no pain  BOWEL Number of stools per week: 5-7 Oral laxative use No  Type of laxative na Enema or suppository use No  History of colostomy No  Incontinent No   BLADDER Normal In and out cath, frequency na Able to self cath  na Bladder incontinence Yes  Frequent urination No  Leakage with coughing No  Difficulty starting stream No  Incomplete bladder emptying No    Mobility walk with assistance use a walker ability to climb steps?  yes do you drive?  no use a wheelchair needs help with transfers  Function disabled: date disabled 08/2019 I need assistance with the following:  feeding, dressing, bathing, toileting, meal prep, household duties, and shopping  Neuro/Psych tremor trouble walking spasms  Prior Studies Any changes since last visit?  no  Physicians involved in  your care Any changes since last visit?  no   Family History  Problem Relation Age of Onset   Hyperlipidemia Mother    Hypertension Mother    Diabetes Maternal Grandmother    Seizures Maternal Grandmother    Thyroid disease Maternal Grandmother    Diabetes Paternal Grandmother    Rashes / Skin problems Son        ezcema   Seizures Son    Social History   Socioeconomic History   Marital status: Single    Spouse name: Not on file   Number of children: 3   Years of education: Not on file   Highest education level: Not on file  Occupational History   Occupation: oncology    Employer:     Comment: NA  Tobacco Use   Smoking status: Never   Smokeless tobacco: Never  Vaping Use   Vaping Use: Never used  Substance and Sexual Activity   Alcohol use: Not Currently    Alcohol/week: 0.0 standard drinks    Comment: quit 2017   Drug use: No    Frequency: 5.0 times per week    Types: Marijuana    Comment: stopped when she found out she was pregnant   Sexual activity: Not Currently    Partners: Male    Birth control/protection: None  Other Topics Concern   Not on file  Social History Narrative  01/06/20 lives with mom and her children   Social Determinants of Health   Financial Resource Strain: Not on file  Food Insecurity: Not on file  Transportation Needs: Not on file  Physical Activity: Not on file  Stress: Not on file  Social Connections: Not on file   Past Surgical History:  Procedure Laterality Date   CARDIAC CATHETERIZATION     IR GASTROSTOMY TUBE MOD SED  10/07/2019   IR GASTROSTOMY TUBE REMOVAL  07/02/2020   LEFT HEART CATH AND CORONARY ANGIOGRAPHY N/A 09/23/2019   Procedure: LEFT HEART CATH AND CORONARY ANGIOGRAPHY;  Surgeon: Yvonne Kendall, MD;  Location: [facility] INVASIVE CV LAB;  Service: Cardiovascular;  Laterality: N/A;   Past Medical History:  Diagnosis Date   Brain injury (HCC)    CAD (coronary artery disease)    HLD (hyperlipidemia)     Hypertension    last pregnancy   Ischemic cardiomyopathy    STEMI (ST elevation myocardial infarction) (HCC) 09/2019   SCAD with aneurysmal dilation of proximal LAD.   Vaginal Pap smear, abnormal    when she was 35yo   Ventricular fibrillation (HCC) 09/23/2019   BP 102/64   Pulse 82   Temp 97.6 F (36.4 C) (Oral)   Ht 5\' 9"  (1.753 m)   Wt 152 lb (68.9 kg)   SpO2 98%   BMI 22.45 kg/m   Opioid Risk Score:   Fall Risk Score:  `1  Depression screen PHQ 2/9  Depression screen [facility] 2/9 09/08/2020 07/14/2020 04/15/2020 03/16/2020 02/12/2020  Decreased Interest 0 0 0 0 2  Down, Depressed, Hopeless 0 0 0 0 0  PHQ - 2 Score 0 0 0 0 2  Altered sleeping - 0 - - 2  Tired, decreased energy - - - - 1  Change in appetite - - - - 3  Feeling bad or failure about yourself  - - - - 0  Trouble concentrating - - - - 3  Moving slowly or fidgety/restless - - - - 3  Suicidal thoughts - - - - 0  PHQ-9 Score - 0 - - 14  Difficult doing work/chores - - - - Extremely dIfficult     Review of Systems  Musculoskeletal:  Positive for gait problem.       Spasms  Neurological:  Positive for tremors.  All other systems reviewed and are negative.     Objective:   Physical Exam General: No acute distress HEENT: NCAT, EOMI, oral membranes moist Cards: reg rate  Chest: normal effort Abdomen: Soft, NT, ND, PEG Skin: dry, intact Extremities: no edema Psych: pleasant and somewhat engaging Skin: Warm and dry.  Intact.    Musc: No edema in extremities.  No tenderness in extremities. Neuro: Alert to person, not place, date. Recognizes mom. Able to answer some simple questions with cues.  Pooling saliva in mouth. Pretty attentive but processing is quite delayed. Remains dysarthric Motor: vision intact. grossly 5/5 throughout,  motor apraxia Delayed processing. Sensory exam normal for light touch and pain in all 4 limbs. No limb ataxia or cerebellar signs. No abnormal tone appreciated.              Assessment & Plan:  1. Anoxic BI - substantial attention, initiation deficits as well as memory deficits             MRI/CTs unremarkable             -given CV risk, we won't try ritalin at this point             -  increase amantadine to 200 mg bid at 7am and 12n             -she doesn't appear depressed             -pt would benefit from ongoing speech therapy as she demonstrates ongoing deficits with cognition, attention, processing, language and swallowing due to the profound effects of her injury.              -in the meantime mom was encouraged to continue working on cognitive exercises, language, processing, etc at home via various methods             -consider bromocriptine trial once ICD is in/discussion with cardiology 2. Dysphagia:             -tolerating regular food--need to continue to encourage to eat and to take meds by mouth. Still quite severe due to her processing deficits/apraxia. Oral phase is very impaired             Cont PEG as a back up for now                3. Gait abnormality             Cont ambulation with assistance               RW for balance             PT as possible insurance permitting 3. Sleep disturbance             Melatonin 3 mg daily --can take with trazodone            Discussed sleep hygiene and routine             -trazodone at night time scheduled.  this has helped her sleep/wake quite a bit   15 minutes of face to face patient care time were spent during this visit. All questions were encouraged and answered. Follow up in 2 mos

## 2020-11-24 NOTE — Patient Instructions (Signed)
PLEASE FEEL FREE TO CALL OUR OFFICE WITH ANY PROBLEMS OR QUESTIONS ([phone])      

## 2020-11-25 NOTE — Pre-Procedure Instructions (Signed)
Attempted to call patient regarding procedure instructions.  Left voicemail on the following items: Arrival time 1130 Nothing to eat or drink after midnight No meds AM of procedure Responsible person to drive you home and stay with you for 24 hrs Wash with special soap night before and morning of procedure  

## 2020-11-26 ENCOUNTER — Ambulatory Visit (HOSPITAL_COMMUNITY): Payer: Medicaid Other | Admitting: Certified Registered Nurse Anesthetist

## 2020-11-26 ENCOUNTER — Other Ambulatory Visit: Payer: Self-pay

## 2020-11-26 ENCOUNTER — Ambulatory Visit (HOSPITAL_COMMUNITY)
Admission: RE | Admit: 2020-11-26 | Discharge: 2020-11-27 | Disposition: A | Discharge status: Home / Self Care | Payer: Medicaid Other | Source: Ambulatory Visit | Attending: Cardiology | Admitting: Cardiology

## 2020-11-26 ENCOUNTER — Ambulatory Visit (HOSPITAL_COMMUNITY)
Admission: RE | Disposition: A | Discharge status: Home / Self Care | Payer: Medicaid Other | Source: Ambulatory Visit | Attending: Cardiology

## 2020-11-26 ENCOUNTER — Encounter (HOSPITAL_COMMUNITY): Payer: Self-pay | Admitting: Cardiology

## 2020-11-26 DIAGNOSIS — I4901 Ventricular fibrillation: Secondary | ICD-10-CM | POA: Diagnosis present

## 2020-11-26 DIAGNOSIS — Z7982 Long term (current) use of aspirin: Secondary | ICD-10-CM | POA: Insufficient documentation

## 2020-11-26 DIAGNOSIS — K9423 Gastrostomy malfunction: Secondary | ICD-10-CM | POA: Insufficient documentation

## 2020-11-26 DIAGNOSIS — I2510 Atherosclerotic heart disease of native coronary artery without angina pectoris: Secondary | ICD-10-CM | POA: Diagnosis not present

## 2020-11-26 DIAGNOSIS — I469 Cardiac arrest, cause unspecified: Secondary | ICD-10-CM | POA: Diagnosis present

## 2020-11-26 DIAGNOSIS — Z8674 Personal history of sudden cardiac arrest: Secondary | ICD-10-CM

## 2020-11-26 DIAGNOSIS — Z8249 Family history of ischemic heart disease and other diseases of the circulatory system: Secondary | ICD-10-CM | POA: Insufficient documentation

## 2020-11-26 DIAGNOSIS — G931 Anoxic brain damage, not elsewhere classified: Secondary | ICD-10-CM | POA: Diagnosis not present

## 2020-11-26 DIAGNOSIS — Z20822 Contact with and (suspected) exposure to covid-19: Secondary | ICD-10-CM | POA: Diagnosis not present

## 2020-11-26 DIAGNOSIS — Z79899 Other long term (current) drug therapy: Secondary | ICD-10-CM | POA: Diagnosis not present

## 2020-11-26 DIAGNOSIS — I5022 Chronic systolic (congestive) heart failure: Secondary | ICD-10-CM | POA: Diagnosis not present

## 2020-11-26 DIAGNOSIS — I110 Hypertensive heart disease with heart failure: Secondary | ICD-10-CM | POA: Diagnosis not present

## 2020-11-26 DIAGNOSIS — I255 Ischemic cardiomyopathy: Secondary | ICD-10-CM | POA: Insufficient documentation

## 2020-11-26 DIAGNOSIS — I2542 Coronary artery dissection: Secondary | ICD-10-CM | POA: Diagnosis not present

## 2020-11-26 DIAGNOSIS — Z95810 Presence of automatic (implantable) cardiac defibrillator: Secondary | ICD-10-CM

## 2020-11-26 HISTORY — PX: SUBQ ICD IMPLANT: EP1223

## 2020-11-26 LAB — HCG, SERUM, QUALITATIVE: Preg, Serum: NEGATIVE

## 2020-11-26 LAB — PACEMAKER DEVICE OBSERVATION

## 2020-11-26 SURGERY — SUBQ ICD IMPLANT
Anesthesia: General

## 2020-11-26 MED ORDER — POVIDONE-IODINE 10 % EX SWAB
2.0000 "application " | Freq: Once | CUTANEOUS | Status: AC
  Administered 2020-11-26: 2 via TOPICAL

## 2020-11-26 MED ORDER — TRAZODONE HCL 50 MG PO TABS
50.0000 mg | ORAL_TABLET | Freq: Every day | ORAL | Status: DC
  Administered 2020-11-26: 50 mg
  Filled 2020-11-26: qty 1

## 2020-11-26 MED ORDER — PROPOFOL 10 MG/ML IV BOLUS
INTRAVENOUS | Status: DC | PRN
  Administered 2020-11-26: 100 mg via INTRAVENOUS
  Administered 2020-11-26: 70 mg via INTRAVENOUS

## 2020-11-26 MED ORDER — SODIUM CHLORIDE 0.9 % IV SOLN: 80.0000 mg | INTRAVENOUS | Status: DC

## 2020-11-26 MED ORDER — FENTANYL CITRATE (PF) 100 MCG/2ML IJ SOLN
INTRAMUSCULAR | Status: DC | PRN
  Administered 2020-11-26: 50 ug via INTRAVENOUS

## 2020-11-26 MED ORDER — ATORVASTATIN CALCIUM 40 MG PO TABS
40.0000 mg | ORAL_TABLET | Freq: Every day | ORAL | Status: DC
  Administered 2020-11-26 – 2020-11-27 (×2): 40 mg
  Filled 2020-11-26 (×2): qty 1

## 2020-11-26 MED ORDER — MELATONIN 5 MG PO TABS
5.0000 mg | ORAL_TABLET | Freq: Every day | ORAL | Status: DC
  Administered 2020-11-26: 5 mg
  Filled 2020-11-26: qty 1

## 2020-11-26 MED ORDER — SODIUM CHLORIDE 0.9 % IV SOLN
INTRAVENOUS | Status: AC
  Filled 2020-11-26: qty 2

## 2020-11-26 MED ORDER — CEFAZOLIN SODIUM-DEXTROSE 2-4 GM/100ML-% IV SOLN
INTRAVENOUS | Status: AC
  Filled 2020-11-26: qty 100

## 2020-11-26 MED ORDER — AMANTADINE HCL 50 MG/5ML PO SOLN
200.0000 mg | Freq: Two times a day (BID) | ORAL | Status: DC
  Administered 2020-11-26 – 2020-11-27 (×2): 200 mg
  Filled 2020-11-26 (×4): qty 20

## 2020-11-26 MED ORDER — CEFAZOLIN SODIUM-DEXTROSE 2-4 GM/100ML-% IV SOLN
2.0000 g | INTRAVENOUS | Status: AC
  Administered 2020-11-26: 2 g via INTRAVENOUS

## 2020-11-26 MED ORDER — ONDANSETRON HCL 4 MG/2ML IJ SOLN
INTRAMUSCULAR | Status: DC | PRN
  Administered 2020-11-26: 4 mg via INTRAVENOUS

## 2020-11-26 MED ORDER — LACTATED RINGERS IV SOLN: INTRAVENOUS | Status: DC | PRN

## 2020-11-26 MED ORDER — LIDOCAINE HCL (PF) 1 % IJ SOLN
INTRAMUSCULAR | Status: DC | PRN
  Administered 2020-11-26: 60 mL via INTRADERMAL

## 2020-11-26 MED ORDER — ACETAMINOPHEN 325 MG PO TABS: 325.0000 mg | ORAL_TABLET | ORAL | Status: DC | PRN

## 2020-11-26 MED ORDER — MIDAZOLAM HCL 2 MG/2ML IJ SOLN
INTRAMUSCULAR | Status: DC | PRN
  Administered 2020-11-26: 2 mg via INTRAVENOUS

## 2020-11-26 MED ORDER — ONDANSETRON HCL 4 MG/2ML IJ SOLN
4.0000 mg | Freq: Four times a day (QID) | INTRAMUSCULAR | Status: DC | PRN
Start: 2020-11-26 — End: 2020-11-27

## 2020-11-26 MED ORDER — SODIUM CHLORIDE 0.9 % IV SOLN: INTRAVENOUS | Status: DC

## 2020-11-26 MED ORDER — ROCURONIUM BROMIDE 10 MG/ML (PF) SYRINGE
PREFILLED_SYRINGE | INTRAVENOUS | Status: DC | PRN
  Administered 2020-11-26: 50 mg via INTRAVENOUS

## 2020-11-26 MED ORDER — LIDOCAINE HCL 1 % IJ SOLN
INTRAMUSCULAR | Status: AC
  Filled 2020-11-26: qty 60

## 2020-11-26 MED ORDER — ENSURE ENLIVE PO LIQD: 237.0000 mL | ORAL | Status: DC

## 2020-11-26 MED ORDER — ACETAMINOPHEN 160 MG/5ML PO SOLN
325.0000 mg | ORAL | Status: DC | PRN
  Administered 2020-11-26 – 2020-11-27 (×2): 650 mg
  Filled 2020-11-26 (×3): qty 20.3

## 2020-11-26 MED ORDER — ADULT MULTIVITAMIN W/MINERALS CH
1.0000 | ORAL_TABLET | Freq: Every day | ORAL | Status: DC
  Administered 2020-11-26 – 2020-11-27 (×2): 1
  Filled 2020-11-26 (×2): qty 1

## 2020-11-26 MED ORDER — ENSURE ENLIVE PO LIQD
474.0000 mL | Freq: Two times a day (BID) | ORAL | Status: DC
  Administered 2020-11-26 – 2020-11-27 (×2): 474 mL

## 2020-11-26 MED ORDER — LIDOCAINE 2% (20 MG/ML) 5 ML SYRINGE
INTRAMUSCULAR | Status: DC | PRN
  Administered 2020-11-26: 60 mg via INTRAVENOUS

## 2020-11-26 MED ORDER — FREE WATER
200.0000 mL | Freq: Four times a day (QID) | Status: DC
  Administered 2020-11-26 – 2020-11-27 (×3): 200 mL

## 2020-11-26 MED ORDER — DEXAMETHASONE SODIUM PHOSPHATE 10 MG/ML IJ SOLN
INTRAMUSCULAR | Status: DC | PRN
  Administered 2020-11-26: 10 mg via INTRAVENOUS

## 2020-11-26 MED ORDER — CARVEDILOL 3.125 MG PO TABS
3.1250 mg | ORAL_TABLET | Freq: Two times a day (BID) | ORAL | Status: DC
  Administered 2020-11-26 – 2020-11-27 (×2): 3.125 mg
  Filled 2020-11-26 (×2): qty 1

## 2020-11-26 MED ORDER — SUGAMMADEX SODIUM 200 MG/2ML IV SOLN
INTRAVENOUS | Status: DC | PRN
  Administered 2020-11-26: 150 mg via INTRAVENOUS

## 2020-11-26 SURGICAL SUPPLY — 5 items
CABLE SURGICAL S-101-97-12 (CABLE) ×2 IMPLANT
ICD SUBCU MRI EMBLEM A219 (ICD Generator) ×2 IMPLANT
LEAD SUBQU EMBLEM 3501 (Pacemaker) ×2 IMPLANT
PAD PRO RADIOLUCENT 2001M-C (PAD) ×2 IMPLANT
TRAY PACEMAKER INSERTION (PACKS) ×2 IMPLANT

## 2020-11-26 NOTE — Progress Notes (Signed)
Called lab/ pgt will be done in 10 minutes.

## 2020-11-26 NOTE — Anesthesia Procedure Notes (Addendum)
Procedure Name: Intubation Date/Time: 11/26/2020 2:11 PM Performed by: Moshe Salisbury, CRNA Pre-anesthesia Checklist: Patient identified, Emergency Drugs available, Suction available, Patient being monitored and Timeout performed Patient Re-evaluated:Patient Re-evaluated prior to induction Oxygen Delivery Method: Circle system utilized Preoxygenation: Pre-oxygenation with 100% oxygen Induction Type: IV induction Ventilation: Mask ventilation without difficulty Laryngoscope Size: Mac and 3 Grade View: Grade I Tube type: Oral Tube size: 6.5 mm Number of attempts: 1 Airway Equipment and Method: Stylet Placement Confirmation: ETT inserted through vocal cords under direct vision, positive ETCO2 and breath sounds checked- equal and bilateral Secured at: 21 cm Tube secured with: Tape Dental Injury: Teeth and Oropharynx as per pre-operative assessment

## 2020-11-26 NOTE — Progress Notes (Signed)
VAST consult for IV start. Upon assessment RAC PIV noted. Preop RN came in the room and stated the pt no longer needs another IV and she will cancel the IV consult.

## 2020-11-26 NOTE — Discharge Instructions (Addendum)
DO NOT Take Aspirin until December 02, 2020. Continue all of your other medications as you were previously taking them.  Please see instructions below for post implant instructions:   Subcutaneous Cardioverter Defibrillator Implantation, Care After  This sheet gives you information about how to care for yourself after your procedure. Your health care provider may also give you more specific instructions. If you have problems or questions, contact your health care provider.  What can I expect after the procedure? After the procedure, it is common to have: Some pain. It may last a few days. A slight bump under the skin where the subcutaneous implantation cardioverter defibrillator (S-ICD) is. You may be able to feel the device under the skin. This is normal.  Follow these instructions at home: Medicines Take over-the-counter and prescription medicines only as told by your health care provider.   Incision care   Follow instructions from your health care provider about how to take care of your incision. Make sure you: Leave stitches (sutures), skin glue, or adhesive strips in place. These skin closures may need to stay in place for 2 weeks or longer. If adhesive strip edges start to loosen and curl up, you may trim the loose edges. Do not remove adhesive strips completely unless your health care provider tells you to do that. Check your incision area every day for signs of infection. Check for: Redness, swelling, or pain. Fluid or blood. Warmth. Pus or a bad smell.  Activity Do not lift anything that is heavier than 10 lb (4.5 kg), or the limit that you are told, until your health care provider says that it is safe. Avoid sports and any other activity that could cause a hit to the generator or leads. Ask your health care provider what activities are safe for you and when you may return to your normal activities. Follow instructions from your health care provider about exercise and sexual  activity restrictions after your procedure.  Electric and magnetic fields Tell all health care providers, including your dentist, that you have a defibrillator. They need to know this so they do not give you an MRI scan, which uses strong magnets. When using your cell phone, hold it to the ear that is on the opposite side from the defibrillator. Do not leave your cell phone in a pocket over the defibrillator. If you must pass through a metal detector, quickly walk through it. Do not stop under the detector, and do not stand near it. Avoid places or objects that have a strong electric or magnetic field, including: Scientist, physiological. At the airport, tell officials that you have a defibrillator. Your defibrillator ID card will let you be checked in a way that is safe for you and will not damage your defibrillator. Also, do not let a security person wave a magnetic wand near your defibrillator. That can make it stop working. Power plants. Large electrical generators. Anti-theft systems or electronic article surveillance (EAS). Radiofrequency transmission towers, such as cell phone and radio towers. Do not use amateur (ham) radio equipment or electric (arc) welding torches. Some devices are safe to use if they are held 12 inches (30 cm) or more away from your defibrillator. These include power tools, lawn mowers, and speakers. If you are not sure if something is safe to use, ask your health care provider. Do not use MP3 player headphones. They have magnets. You may safely use electric blankets, heating pads, computers, and microwave ovens. General instructions Do not take baths,  swim, shower, or use a hot tub until your health care provider approves. You may need to take sponge baths until your health care provider says that you may bathe or shower. Do not drive until your health care provider approves. Always keep your defibrillator ID card with you. The card should list the implant date, device  model, and manufacturer. Consider wearing a medical alert bracelet or necklace that says that you have an S-ICD. Do not use any products that contain nicotine or tobacco, such as cigarettes and e-cigarettes. If you need help quitting, ask your health care provider. Have your defibrillator checked as often as told by your health care provider. Most S-ICDs last for 4-8 years before they need to be replaced.  Contact a health care provider if: You feel one shock in your chest. You gain weight suddenly. You have a fever. You have severe pain, and medicines do not help. You have redness, swelling, or pain around your incision area. You have pus or a bad smell coming from your incision area. You have fluid or blood coming from your incision. Your incision area feels warm to the touch. Your heart feels like it is fluttering or skipping beats (heart palpitations). You feel increased anxiety or depression.  Get help right away if: You feel more than one shock. You have chest pain. You have problems breathing or have shortness of breath. You have dizziness or fainting.  Summary After the procedure, you may have some pain, see a bump under your skin, and feel the device under your skin. Check your incision area every day for signs of infection. Be careful around electric and magnetic fields. Always keep your defibrillator ID card with you. You should receive this in 6-8 weeks

## 2020-11-26 NOTE — Anesthesia Preprocedure Evaluation (Signed)
Anesthesia Evaluation  Patient identified by MRN, date of birth, ID band Patient awake    Reviewed: Allergy & Precautions, NPO status , Patient's Chart, lab work & pertinent test results  Airway Mallampati: II  TM Distance: >3 FB Neck ROM: Full    Dental  (+) Dental Advisory Given   Pulmonary neg pulmonary ROS,    breath sounds clear to auscultation       Cardiovascular hypertension, Pt. on medications and Pt. on home beta blockers + CAD (SCAD) and + Past MI  + dysrhythmias Ventricular Fibrillation  Rhythm:Regular Rate:Normal  11/21: Normal EF. Valves okay.   Neuro/Psych negative neurological ROS     GI/Hepatic negative GI ROS, Neg liver ROS,   Endo/Other  negative endocrine ROS  Renal/GU negative Renal ROS     Musculoskeletal   Abdominal   Peds  Hematology negative hematology ROS (+)   Anesthesia Other Findings   Reproductive/Obstetrics                             Anesthesia Physical Anesthesia Plan  ASA: 3  Anesthesia Plan: General   Post-op Pain Management:    Induction: Intravenous  PONV Risk Score and Plan: 3 and Dexamethasone, Ondansetron and Treatment may vary due to age or medical condition  Airway Management Planned: Oral ETT  Additional Equipment: None  Intra-op Plan:   Post-operative Plan: Extubation in OR  Informed Consent: I have reviewed the patients History and Physical, chart, labs and discussed the procedure including the risks, benefits and alternatives for the proposed anesthesia with the patient or authorized representative who has indicated his/her understanding and acceptance.     Dental advisory given  Plan Discussed with: CRNA  Anesthesia Plan Comments:         Anesthesia Quick Evaluation

## 2020-11-26 NOTE — Interval H&P Note (Signed)
History and Physical Interval Note:  11/26/2020 1:18 PM  Beverly [name]  has presented today for surgery, with the diagnosis of cardiomyopathy.  The various methods of treatment have been discussed with the patient and family. After consideration of risks, benefits and other options for treatment, the patient has consented to  Procedure(s): SUBQ ICD IMPLANT (N/A) as a surgical intervention.  The patient's history has been reviewed, patient examined, no change in status, stable for surgery.  I have reviewed the patient's chart and labs.  Questions were answered to the patient's satisfaction.     Mariesa Grieder T Chester Sibert

## 2020-11-26 NOTE — Transfer of Care (Signed)
Immediate Anesthesia Transfer of Care Note  Patient: Ahtziry A Samonte  Procedure(s) Performed: SUBQ ICD IMPLANT  Patient Location: PACU and Cath Lab  Anesthesia Type:General  Level of Consciousness: drowsy and patient cooperative  Airway & Oxygen Therapy: Patient Spontanous Breathing and Patient connected to nasal cannula oxygen  Post-op Assessment: Report given to RN and Post -op Vital signs reviewed and stable  Post vital signs: Reviewed and stable  Last Vitals:  Vitals Value Taken Time  BP 112/94 11/26/20 1630  Temp    Pulse 84 11/26/20 1633  Resp 16 11/26/20 1633  SpO2 100 % 11/26/20 1633  Vitals shown include unvalidated device data.  Last Pain:  Vitals:   11/26/20 1221  TempSrc:   PainSc: 0-No pain         Complications: No notable events documented.

## 2020-11-27 ENCOUNTER — Ambulatory Visit (HOSPITAL_COMMUNITY): Payer: Medicaid Other

## 2020-11-27 DIAGNOSIS — Z8674 Personal history of sudden cardiac arrest: Secondary | ICD-10-CM | POA: Diagnosis not present

## 2020-11-27 DIAGNOSIS — I4901 Ventricular fibrillation: Secondary | ICD-10-CM | POA: Diagnosis not present

## 2020-11-27 DIAGNOSIS — I469 Cardiac arrest, cause unspecified: Secondary | ICD-10-CM

## 2020-11-27 DIAGNOSIS — Z95810 Presence of automatic (implantable) cardiac defibrillator: Secondary | ICD-10-CM

## 2020-11-27 DIAGNOSIS — I2542 Coronary artery dissection: Secondary | ICD-10-CM | POA: Diagnosis not present

## 2020-11-27 DIAGNOSIS — Z20822 Contact with and (suspected) exposure to covid-19: Secondary | ICD-10-CM | POA: Diagnosis not present

## 2020-11-27 LAB — RESP PANEL BY RT-PCR (FLU A&B, COVID) ARPGX2
Influenza A by PCR: NEGATIVE
Influenza B by PCR: NEGATIVE
SARS Coronavirus 2 by RT PCR: NEGATIVE

## 2020-11-27 MED ORDER — ASPIRIN 81 MG PO CHEW: CHEWABLE_TABLET | ORAL | 3 refills | Status: DC

## 2020-11-27 NOTE — Progress Notes (Signed)
EP Progress Note  Patient Name: Beverly [name] Date of Encounter: 11/27/2020  [facility] Cardiologist: Yvonne Kendall, MD   Subjective   Doing well today after ICD implant yesterday.  Inpatient Medications    Scheduled Meds:  amantadine  200 mg Per Tube BID   atorvastatin  40 mg Per Tube Daily   carvedilol  3.125 mg Per Tube BID WC   feeding supplement  237 mL Per Tube Q24H   feeding supplement  474 mL Per Tube BID   free water  200 mL Per Tube Q6H   melatonin  5 mg Per Tube QHS   multivitamin with minerals  1 tablet Per Tube Daily   traZODone  50 mg Per Tube QHS   Continuous Infusions:  PRN Meds: acetaminophen (TYLENOL) oral liquid 160 mg/5 mL, ondansetron (ZOFRAN) IV   Vital Signs    Vitals:   11/26/20 1958 11/27/20 0020 11/27/20 0347 11/27/20 0815  BP: 108/70 (!) 90/57 102/61 110/67  Pulse: 92 81 67 (!) 58  Resp: 18 18 18 16   Temp: 99.1 F (37.3 C) 98.5 F (36.9 C) 98.4 F (36.9 C) 97.9 F (36.6 C)  TempSrc: Oral Oral Oral Oral  SpO2: 100% 98% 98% 100%  Weight:      Height:        Intake/Output Summary (Last 24 hours) at 11/27/2020 11/29/2020 Last data filed at 11/27/2020 0636 Gross per 24 hour  Intake 1000 ml  Output 825 ml  Net 175 ml   Last 3 Weights 11/26/2020 11/24/2020 11/03/2020  Weight (lbs) 152 lb 152 lb 154 lb 12.8 oz  Weight (kg) 68.947 kg 68.947 kg 70.217 kg      Telemetry    Personally Reviewed  ECG    No new- Personally Reviewed  Physical Exam   GEN: No acute distress.   Neck: No JVD Cardiac: RRR, no murmurs, rubs, or gallops.  Incisions are healing well.  No hematoma. Respiratory: Clear to auscultation bilaterally. GI: Soft, nontender, non-distended  MS: No edema; No deformity. Neuro:  Nonfocal  Psych: Normal affect   Labs    High Sensitivity Troponin:  No results for input(s): TROPONINIHS in the last 720 hours.   ChemistryNo results for input(s): NA, K, CL, CO2, GLUCOSE, BUN, CREATININE, CALCIUM, MG, PROT,  ALBUMIN, AST, ALT, ALKPHOS, BILITOT, GFRNONAA, GFRAA, ANIONGAP in the last 168 hours.  Lipids No results for input(s): CHOL, TRIG, HDL, LABVLDL, LDLCALC, CHOLHDL in the last 168 hours.  HematologyNo results for input(s): WBC, RBC, HGB, HCT, MCV, MCH, MCHC, RDW, PLT in the last 168 hours. Thyroid No results for input(s): TSH, FREET4 in the last 168 hours.  BNPNo results for input(s): BNP, PROBNP in the last 168 hours.  DDimer No results for input(s): DDIMER in the last 168 hours.   Radiology    No results found.  Cardiac Studies   Chest x-ray reviewed and shows a well-positioned subcutaneous ICD   Assessment & Plan  35 y.o. female with a history of spontaneous coronary artery dissection complicated by VF arrest who is now postop day 1 from subcutaneous ICD implant.  Device functioning well.  Chest x-ray shows well-positioned device.  Okay to discharge later today.   #ICD in situ Device functioning appropriately Plan to resume aspirin 5 days from now. Remote monitoring explained.  Okay to discharge  For questions or updates, please contact [facility] Please consult [email or web address] for contact info under        Signed, 34, MD  11/27/2020, 8:22 AM

## 2020-11-27 NOTE — Anesthesia Postprocedure Evaluation (Signed)
Anesthesia Post Note  Patient: Beverly [name]  Procedure(s) Performed: SUBQ ICD IMPLANT     Patient location during evaluation: PACU Anesthesia Type: General Level of consciousness: awake and alert Pain management: pain level controlled Vital Signs Assessment: post-procedure vital signs reviewed and stable Respiratory status: spontaneous breathing, nonlabored ventilation, respiratory function stable and patient connected to nasal cannula oxygen Cardiovascular status: blood pressure returned to baseline and stable Postop Assessment: no apparent nausea or vomiting Anesthetic complications: no   No notable events documented.  Last Vitals:  Vitals:   11/27/20 0347 11/27/20 0815  BP: 102/61 110/67  Pulse: 67 (!) 58  Resp: 18 16  Temp: 36.9 C 36.6 C  SpO2: 98% 100%    Last Pain:  Vitals:   11/27/20 0921  TempSrc:   PainSc: 0-No pain                 Kennieth Rad

## 2020-11-27 NOTE — Discharge Summary (Signed)
Discharge Summary    Patient ID: Beverly [name] MRN: [id number]; DOB: 05-12-1985  Admit date: 11/26/2020 Discharge date: 11/27/2020  PCP:  Delman Cheadle, PA   [facility] Providers Cardiologist:  Yvonne Kendall, MD  Electrophysiologist:  Lanier Prude, MD       Discharge Diagnoses    Principal Problem:   Hx of Cardiac arrest [facility]) Active Problems:   Ventricular fibrillation [facility])   S/P ICD (internal cardiac defibrillator) procedure   Coronary artery disease involving native coronary artery of native heart without angina pectoris    Diagnostic Studies/Procedures    ICD Implantation 11/26/20 CONCLUSIONS:  1.  History of cardiac arrest secondary to spontaneous coronary artery dissection 2. Successful ICD implantation with a [employer] subcutaneous ICD implanted for secondary prevention of sudden cardiac death. 3.  No early apparent complications.  Lead/Device Information: ICD lead (serial [id number])  Pulse generator (emblem MRI S-ICD, serial [id number])  _____________   History of Present Illness     Beverly [name] is a 35 y.o. female with a hx of VF arrest in the setting of SCAD (Spontaneous Coronary Artery Dissection) of the ostial/proximal LAD in 2021 c/b anoxic brain injury, (HFrEF) heart failure with reduced ejection fraction, ischemic cardiomyopathy.  She was referred to EP for consideration of ICD.  She was evaluated by Dr. Lalla Brothers and set up for SQ ICD.  Hospital Course     Consultants: none    She was admitted and underwent implantation of a [employer] SQ ICD with Dr. Lalla Brothers without apparent complication.  She was evaluated by Dr. Lalla Brothers this AM.  Her device is functioning normally.  Her post op CXR is unremarkable. She is felt to be stable and ready for DC to home.  She can resume ASA in 5 days.    Did the patient have an acute coronary syndrome (MI, NSTEMI, STEMI, etc) this admission?:  No                               Did  the patient have a percutaneous coronary intervention (stent / angioplasty)?:  No.     _____________  Discharge Vitals Blood pressure 110/67, pulse (!) 58, temperature 97.9 F (36.6 C), temperature source Oral, resp. rate 16, height 5\' 9"  (1.753 m), weight 68.9 kg, last menstrual period 11/07/2020, SpO2 100 %, not currently breastfeeding.  Filed Weights   11/26/20 1134  Weight: 68.9 kg    Labs & Radiologic Studies    _____________  DG Chest 2 View  Result Date: 11/27/2020 CLINICAL DATA:  Implantable cardiac defibrillator.  Is EXAM: CHEST - 2 VIEW COMPARISON:  October 06, 2019. FINDINGS: A better pack for precordial defibrillator is placed over the LEFT chest. Precordial lead is seen over the sternum on the lateral projection. This lead projects over the midline on the frontal projection. EKG leads project over the chest. Cardiomediastinal contours and hilar structures are normal. Lungs are clear.  No visible pneumothorax. On limited assessment there is no acute skeletal process. IMPRESSION: No active cardiopulmonary disease. Precordial defibrillator lead projects over the anterior chest and power pack over the LEFT lateral chest. Electronically Signed   By: October 08, 2019 M.D.   On: 11/27/2020 08:48   Disposition   Pt is being discharged home today in good condition.  Follow-up Plans & Appointments     Follow-up Information     [facility] [address] [facility] Follow up on 12/09/2020.  Specialty: Cardiology Why: 4:00 PM (Device Clinic - Wound Check) Contact information: [address], [address] [place] [address] [phone]        Lanier Prude, MD Follow up on 03/02/2021.   Specialties: Cardiology, Radiology Why: 3:40 PM Contact information: [address] [place] [address] [phone]                Discharge Instructions     Call MD for:   Complete by: As directed    See Instructions for Post ICD Care on AVS.    Diet - low sodium heart healthy   Complete by: As directed    Discharge wound care:   Complete by: As directed    See instructions on AVS   Increase activity slowly   Complete by: As directed        Discharge Medications   Allergies as of 11/27/2020   No Known Allergies      Medication List     TAKE these medications    amantadine 50 MG/5ML solution Commonly known as: SYMMETREL Place 20 mLs (200 mg total) into feeding tube 2 (two) times daily.   aspirin 81 MG chewable tablet PLACE 1 TABLET (81 MG TOTAL) INTO FEEDING TUBE DAILY. Start taking on: December 02, 2020 What changed: These instructions start on December 02, 2020. If you are unsure what to do until then, ask your doctor or other care provider.   atorvastatin 80 MG tablet Commonly known as: LIPITOR Place 0.5 tablets (40 mg total) into feeding tube daily.   carvedilol 6.25 MG tablet Commonly known as: COREG Place 0.5 tablets (3.125 mg total) into feeding tube 2 (two) times daily with a meal.   feeding supplement Liqd Take 237 mLs by mouth 2 (two) times daily between meals. What changed:  how much to take when to take this additional instructions   free water Soln Place 200 mLs into feeding tube every 6 (six) hours.   Melatonin 5 MG Caps Place 5 mg into feeding tube at bedtime.   melatonin 3 MG Tabs tablet Place 1 tablet (3 mg total) into feeding tube at bedtime as needed (insomia/nocturnal agitation).   multivitamin with minerals Tabs tablet Place 1 tablet into feeding tube daily.   prenatal multivitamin Tabs tablet Place 1 tablet into feeding tube daily at 12 noon.   traZODone 50 MG tablet Commonly known as: DESYREL TAKE 1 TABLET BY MOUTH EVERY DAY AT BEDTIME AS NEEDED FOR SLEEP What changed: See the new instructions.               Discharge Care Instructions  (From admission, onward)           Start     Ordered   11/27/20 0000  Discharge wound care:       Comments: See  instructions on AVS   11/27/20 0935               Outstanding Labs/Studies   None   Duration of Discharge Encounter   Greater than 30 minutes including physician time.  Signed, Tereso Newcomer, PA-C 11/27/2020, 9:38 AM

## 2020-11-29 ENCOUNTER — Encounter (HOSPITAL_COMMUNITY): Payer: Self-pay | Admitting: Cardiology

## 2020-11-29 MED FILL — Lidocaine HCl Local Inj 1%: INTRAMUSCULAR | Qty: 60 | Status: AC

## 2020-11-29 MED FILL — Gentamicin Sulfate Inj 40 MG/ML: INTRAMUSCULAR | Qty: 80 | Status: AC

## 2020-12-09 ENCOUNTER — Ambulatory Visit (INDEPENDENT_AMBULATORY_CARE_PROVIDER_SITE_OTHER): Payer: Medicaid Other

## 2020-12-09 ENCOUNTER — Other Ambulatory Visit: Payer: Self-pay

## 2020-12-09 DIAGNOSIS — I469 Cardiac arrest, cause unspecified: Secondary | ICD-10-CM

## 2020-12-09 LAB — CUP PACEART INCLINIC DEVICE CHECK
Date Time Interrogation Session: 20221103212628
Implantable Pulse Generator Implant Date: 20221021
Pulse Gen Serial Number: 161805

## 2020-12-09 MED ORDER — CEPHALEXIN 500 MG PO CAPS: 500.0000 mg | ORAL_CAPSULE | Freq: Three times a day (TID) | ORAL | 0 refills | Status: AC

## 2020-12-09 NOTE — Patient Instructions (Signed)
Please continue to monitor wound for increased redness, drainage, bleeding, fever or chills. Make sure dressing stays dry until next office visit on 12/16/20 @ 4:00. Please call device clinic with further questions or concerns at [phone].    After Your ICD (Implantable Cardiac Defibrillator)    Monitor your defibrillator site for redness, swelling, and drainage. Call the device clinic at [phone] if you experience these symptoms or fever/chills.  Your incision was closed with Steri-strips  You may wash your incisoin area over left rib area with warm soap and water. Please keep dressing dry. Avoid lotions, ointments, or perfumes over your incision until it is well-healed.  You may use a hot tub or a pool after your wound check appointment if the incision is completely closed.  Do not lift, push or pull greater than 10 pounds with the affected arm until 6 weeks after your procedure. There are no other restrictions in arm movement after your wound check appointment.   Your ICD is designed to protect you from life threatening heart rhythms. Because of this, you may receive a shock.   1 shock with no symptoms:  Call the office during business hours. 1 shock with symptoms (chest pain, chest pressure, dizziness, lightheadedness, shortness of breath, overall feeling unwell):  Call 911. If you experience 2 or more shocks in 24 hours:  Call 911. If you receive a shock, you should not drive.  [place] DMV - no driving for 6 months if you receive appropriate therapy from your ICD.   ICD Alerts:  Some alerts are vibratory and others beep. These are NOT emergencies. Please call our office to let us know. If this occurs at night or on weekends, it can wait until the next business day. Send a remote transmission.  If your device is capable of reading fluid status (for heart failure), you will be offered monthly monitoring to review this with you.   Remote monitoring is used to monitor your ICD  from home. This monitoring is scheduled every 91 days by our office. It allows [place] to keep an eye on the functioning of your device to ensure it is working properly. You will routinely see your Electrophysiologist annually (more often if necessary).

## 2020-12-09 NOTE — Progress Notes (Signed)
Wound check in-clinic post ICD implant. Steri-strips removed from ICD incision. Wound is well healed with edges well approximated. No redness, swelling or drainage noted.  Steri-strips were previously removed prior to OV today at center of chest. Edges were not well approximated and granular tissue present. Dr. Lalla Brothers in to see patient and verbal orders obtained to start Keflex 500mg  TID x7 days & reapply steri-strips and come back for wound recheck in 7 days. Patient provided wound care education w/ verbal understanding.  Subcutaneous ICD check in clinic. 0 untreated episodes; 0 treated episodes; 0 shocks delivered. Electrode impedance status okay. No programming changes. Remaining longevity to ERI 99%.  See scanned report. Error in saving file.

## 2020-12-13 ENCOUNTER — Encounter: Payer: Medicaid Other | Admitting: Occupational Therapy

## 2020-12-13 ENCOUNTER — Encounter: Payer: Medicaid Other | Admitting: Speech Pathology

## 2020-12-16 ENCOUNTER — Ambulatory Visit: Payer: Medicaid Other

## 2020-12-16 ENCOUNTER — Other Ambulatory Visit: Payer: Self-pay

## 2020-12-16 DIAGNOSIS — I255 Ischemic cardiomyopathy: Secondary | ICD-10-CM

## 2020-12-16 NOTE — Progress Notes (Signed)
Pt in office for wound recheck.  Steri strips removed.  Wound edges well approximated.  No s/s of infection.   Pt has 2 days remaining on Keflex prescription.  Educated to finish medication as prescribed.    Pt/ caregiver provided verbal consent for photo  to be entered into chart.

## 2020-12-20 ENCOUNTER — Ambulatory Visit: Payer: Medicaid Other

## 2020-12-22 ENCOUNTER — Ambulatory Visit: Payer: Medicaid Other | Admitting: Physical Therapy

## 2020-12-22 ENCOUNTER — Encounter: Payer: Medicaid Other | Admitting: Speech Pathology

## 2020-12-22 ENCOUNTER — Encounter: Payer: Medicaid Other | Admitting: Occupational Therapy

## 2020-12-23 ENCOUNTER — Encounter: Payer: Self-pay | Admitting: Internal Medicine

## 2020-12-23 ENCOUNTER — Other Ambulatory Visit: Payer: Self-pay

## 2020-12-23 ENCOUNTER — Ambulatory Visit (INDEPENDENT_AMBULATORY_CARE_PROVIDER_SITE_OTHER): Payer: Medicaid Other | Admitting: Internal Medicine

## 2020-12-23 VITALS — BP 90/68 | HR 92 | Ht 68.0 in | Wt 152.0 lb

## 2020-12-23 DIAGNOSIS — G931 Anoxic brain damage, not elsewhere classified: Secondary | ICD-10-CM | POA: Diagnosis not present

## 2020-12-23 DIAGNOSIS — I469 Cardiac arrest, cause unspecified: Secondary | ICD-10-CM

## 2020-12-23 DIAGNOSIS — I255 Ischemic cardiomyopathy: Secondary | ICD-10-CM

## 2020-12-23 DIAGNOSIS — I4901 Ventricular fibrillation: Secondary | ICD-10-CM

## 2020-12-23 DIAGNOSIS — I2510 Atherosclerotic heart disease of native coronary artery without angina pectoris: Secondary | ICD-10-CM

## 2020-12-23 NOTE — Progress Notes (Signed)
Follow-up Outpatient Visit Date: 12/23/2020  Primary Care Provider: Delman Cheadle, PA [address] [place] [address]  Chief Complaint: Follow-up ischemic cardiomyopathy, coronary artery disease, and ventricular fibrillation  HPI:  Beverly [name] is a 35 y.o. 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  I last saw her in August, at which time her mother reported continued falls.  Otherwise, Beverly [name] was asymptomatic.  We agreed to decrease carvedilol and atorvastatin.  She was referred to EP for consideration of ICD.  She underwent subcutaneous ICD implantation last month by Dr. Lalla Brothers.  Beverly [name] is accompanied by her mother again today, who provides most of the history.  Beverly [name] has been feeling well without chest pain, shortness of breath, palpitations, lightheadedness, and edema.  Issues related to wound healing from her ICD placement have resolved.  Her mother has noted some abnormal lites on the remote transmitter.  Beverly [name] has not had any ICD shocks.  --------------------------------------------------------------------------------------------------  Past Medical History:  Diagnosis Date   Brain injury    CAD (coronary artery disease)    HLD (hyperlipidemia)    Hypertension    last pregnancy   Ischemic cardiomyopathy    STEMI (ST elevation myocardial infarction) (HCC) 09/2019   SCAD with aneurysmal dilation of proximal LAD.   Vaginal Pap smear, abnormal    when she was 35yo   Ventricular fibrillation (HCC) 09/23/2019   Past Surgical History:  Procedure Laterality Date   CARDIAC CATHETERIZATION     IR GASTROSTOMY TUBE MOD SED  10/07/2019   IR GASTROSTOMY TUBE REMOVAL  07/02/2020   LEFT HEART CATH  AND CORONARY ANGIOGRAPHY N/A 09/23/2019   Procedure: LEFT HEART CATH AND CORONARY ANGIOGRAPHY;  Surgeon: Yvonne Kendall, MD;  Location: [facility] INVASIVE CV LAB;  Service: Cardiovascular;  Laterality: N/A;   SUBQ ICD IMPLANT N/A 11/26/2020   Procedure: SUBQ ICD IMPLANT;  Surgeon: Lanier Prude, MD;  Location: [facility] INVASIVE CV LAB;  Service: Cardiovascular;  Laterality: N/A;    Current Meds  Medication Sig   amantadine (SYMMETREL) 50 MG/5ML solution Place 20 mLs (200 mg total) into feeding tube 2 (two) times daily.   aspirin 81 MG chewable tablet PLACE 1 TABLET (81 MG TOTAL) INTO FEEDING TUBE DAILY.   atorvastatin (LIPITOR) 80 MG tablet Place 0.5 tablets (40 mg total) into feeding tube daily.   carvedilol (COREG) 6.25 MG tablet Place 0.5 tablets (3.125 mg total) into feeding tube 2 (two) times daily with a meal.   Ensure Plus (ENSURE PLUS) LIQD Take by mouth. Drinks 5-6 cans per day   Melatonin 5 MG CAPS Place 5 mg into feeding tube at bedtime.   Multiple Vitamin (MULTIVITAMIN WITH MINERALS) TABS tablet Place 1 tablet into feeding tube daily.   traZODone (DESYREL) 50 MG tablet TAKE 1 TABLET BY MOUTH EVERY DAY AT BEDTIME AS NEEDED FOR SLEEP   Water For Irrigation, Sterile (FREE WATER) SOLN Place 200 mLs into feeding tube every 6 (six) hours.    Allergies: Patient has no known allergies.  Social History   Tobacco Use   Smoking status: Never   Smokeless tobacco: Never  Vaping Use   Vaping Use: Never used  Substance Use Topics   Alcohol use: Not Currently    Alcohol/week: 0.0 standard drinks    Comment:  quit 2017   Drug use: No    Frequency: 5.0 times per week    Types: Marijuana    Comment: stopped when she found out she was pregnant    Family History  Problem Relation Age of Onset   Hyperlipidemia Mother    Hypertension Mother    Diabetes Maternal Grandmother    Seizures Maternal Grandmother    Thyroid disease Maternal Grandmother    Diabetes Paternal Grandmother    Rashes  / Skin problems Son        ezcema   Seizures Son     Review of Systems: A 12-system review of systems was performed and was negative except as noted in the HPI.  --------------------------------------------------------------------------------------------------  Physical Exam: BP 90/68 (BP Location: Right Arm, Patient Position: Sitting, Cuff Size: Normal)   Pulse 92   Ht 5\' 8"  (1.727 m)   Wt 152 lb (68.9 kg)   SpO2 96%   BMI 23.11 kg/m   General:  NAD. Neck: No JVD or HJR. Lungs: Clear to auscultation bilaterally without wheezes or crackles. Heart: Regular rate and rhythm without murmurs, rubs, or gallops. Abdomen: Soft, nontender, nondistended. Extremities: No lower extremity edema.  EKG:  Normal sinus rhythm with poor R wave progression in V1-V3, new since 11/27/2020 but similar to 09/22/2020.  Lab Results  Component Value Date   WBC 5.7 11/03/2020   HGB 13.1 11/03/2020   HCT 38.8 11/03/2020   MCV 88 11/03/2020   PLT 360 11/03/2020    Lab Results  Component Value Date   NA 136 11/03/2020   K 4.5 11/03/2020   CL 101 11/03/2020   CO2 20 11/03/2020   BUN 12 11/03/2020   CREATININE 0.59 11/03/2020   GLUCOSE 87 11/03/2020   ALT 25 03/15/2020    Lab Results  Component Value Date   CHOL 107 03/15/2020   HDL 42 03/15/2020   LDLCALC 41 03/15/2020   LDLDIRECT 48 03/15/2020   TRIG 137 03/15/2020   CHOLHDL 2.5 03/15/2020    --------------------------------------------------------------------------------------------------  ASSESSMENT AND PLAN: Coronary artery disease: No new symptoms to suggest worsening coronary insufficiency in the setting of aneurysmal coronary arteries and SCAD of the ostial LAD last year leading to ventricular fibrillation arrest, heart failure, and anoxic brain injury.  Continue current medications for secondary prevention including aspirin and atorvastatin.  Ischemic cardiomyopathy: Most recent echo showed low normal LVEF.  Beverly [name] does  not have any signs or symptoms of heart failure.  Her soft blood pressure has forced Raul Del to do escalate her GDMT in the past.  Blood pressure remains borderline low today, albeit without symptoms.  We have agreed to continue carvedilol 3.125 mg twice daily.  Ventricular fibrillation arrest: Beverly [name] is now status post subcutaneous ICD by Dr. Raul Del.  I have asked her mother to reach out to [facility] and/or the device clinic if she has continued questions about the remote transmitter.  Continue routine device follow-up with [facility].  Anoxic brain injury: Beverly [name] mother inquires about additional medication therapies to help her daughters attentiveness and cognitive function.  We have previously been reluctant to add stimulants given cardiac arrest.  I still think there would be some increased risk for arrhythmias, though having an ICD present provides her with some degree of protection.  I am happy to speak with Dr. Gretel Acre regarding risks and benefits if he wishes to escalate such medications in the future.  Follow-up: Return to clinic in 6 months.  Riley Kill, MD 12/23/2020  4:11 PM

## 2020-12-23 NOTE — Patient Instructions (Signed)
Medication Instructions:  ° °Your physician recommends that you continue on your current medications as directed. Please refer to the Current Medication list given to you today. ° °*If you need a refill on your cardiac medications before your next appointment, please call your pharmacy* ° ° °Lab Work: ° °None ordered ° °Testing/Procedures: ° °None ordered ° ° °Follow-Up: °At [facility], you and your health needs are our priority.  As part of our continuing mission to provide you with exceptional heart care, we have created designated Provider Care Teams.  These Care Teams include your primary Cardiologist (physician) and Advanced Practice Providers (APPs -  Physician Assistants and Nurse Practitioners) who all work together to provide you with the care you need, when you need it. ° °We recommend signing up for the patient portal called "MyChart".  Sign up information is provided on this After Visit Summary.  MyChart is used to connect with patients for Virtual Visits (Telemedicine).  Patients are able to view lab/test results, encounter notes, upcoming appointments, etc.  Non-urgent messages can be sent to your provider as well.   °To learn more about what you can do with MyChart, go to [email or web address].   ° °Your next appointment:   °6 month(s) ° °The format for your next appointment:   °In Person ° °Provider:   °You may see Christopher End, MD or one of the following Advanced Practice Providers on your designated Care Team:   °Christopher Berge, NP °Ryan Dunn, PA-C °Cadence Furth, PA-C °

## 2020-12-25 ENCOUNTER — Other Ambulatory Visit: Payer: Self-pay | Admitting: Internal Medicine

## 2020-12-25 ENCOUNTER — Encounter: Payer: Self-pay | Admitting: Internal Medicine

## 2020-12-27 ENCOUNTER — Encounter: Payer: Medicaid Other | Admitting: Speech Pathology

## 2020-12-29 ENCOUNTER — Ambulatory Visit: Payer: Medicaid Other | Admitting: Physical Therapy

## 2020-12-29 ENCOUNTER — Encounter: Payer: Medicaid Other | Admitting: Speech Pathology

## 2021-01-03 ENCOUNTER — Encounter: Payer: Medicaid Other | Admitting: Occupational Therapy

## 2021-01-03 ENCOUNTER — Encounter: Payer: Medicaid Other | Admitting: Speech Pathology

## 2021-01-05 ENCOUNTER — Encounter: Payer: Medicaid Other | Admitting: Speech Pathology

## 2021-01-10 ENCOUNTER — Encounter: Payer: Medicaid Other | Admitting: Speech Pathology

## 2021-01-10 ENCOUNTER — Ambulatory Visit: Payer: Medicaid Other

## 2021-01-10 ENCOUNTER — Encounter: Payer: Medicaid Other | Admitting: Occupational Therapy

## 2021-01-12 ENCOUNTER — Encounter: Payer: Medicaid Other | Admitting: Occupational Therapy

## 2021-01-12 ENCOUNTER — Encounter: Payer: Medicaid Other | Admitting: Speech Pathology

## 2021-01-12 ENCOUNTER — Ambulatory Visit: Payer: Medicaid Other

## 2021-01-17 ENCOUNTER — Encounter: Payer: Medicaid Other | Admitting: Speech Pathology

## 2021-01-17 ENCOUNTER — Ambulatory Visit: Payer: Medicaid Other

## 2021-01-17 ENCOUNTER — Encounter: Payer: Medicaid Other | Admitting: Occupational Therapy

## 2021-01-19 ENCOUNTER — Encounter: Payer: Medicaid Other | Admitting: Occupational Therapy

## 2021-01-19 ENCOUNTER — Encounter: Payer: Medicaid Other | Admitting: Speech Pathology

## 2021-01-19 ENCOUNTER — Ambulatory Visit: Payer: Medicaid Other

## 2021-01-24 ENCOUNTER — Encounter: Payer: Medicaid Other | Admitting: Speech Pathology

## 2021-01-24 ENCOUNTER — Encounter: Payer: Medicaid Other | Admitting: Occupational Therapy

## 2021-01-25 ENCOUNTER — Encounter: Payer: Medicaid Other | Admitting: Speech Pathology

## 2021-01-25 ENCOUNTER — Other Ambulatory Visit: Payer: Self-pay | Admitting: Physical Medicine & Rehabilitation

## 2021-01-25 ENCOUNTER — Encounter: Payer: Medicaid Other | Admitting: Occupational Therapy

## 2021-01-25 ENCOUNTER — Ambulatory Visit: Payer: Medicaid Other

## 2021-01-25 DIAGNOSIS — G479 Sleep disorder, unspecified: Secondary | ICD-10-CM

## 2021-01-27 ENCOUNTER — Ambulatory Visit: Payer: Medicaid Other

## 2021-01-27 ENCOUNTER — Encounter: Payer: Medicaid Other | Admitting: Speech Pathology

## 2021-02-01 ENCOUNTER — Ambulatory Visit: Payer: Medicaid Other | Admitting: Physical Therapy

## 2021-02-01 ENCOUNTER — Encounter: Payer: Medicaid Other | Admitting: Speech Pathology

## 2021-02-02 ENCOUNTER — Encounter: Payer: Self-pay | Admitting: Physical Medicine & Rehabilitation

## 2021-02-02 ENCOUNTER — Encounter: Payer: Medicaid Other | Attending: Physical Medicine & Rehabilitation | Admitting: Physical Medicine & Rehabilitation

## 2021-02-02 ENCOUNTER — Other Ambulatory Visit: Payer: Self-pay

## 2021-02-02 VITALS — BP 111/74 | HR 74 | Ht 68.0 in | Wt 156.6 lb

## 2021-02-02 DIAGNOSIS — I469 Cardiac arrest, cause unspecified: Secondary | ICD-10-CM

## 2021-02-02 DIAGNOSIS — G931 Anoxic brain damage, not elsewhere classified: Secondary | ICD-10-CM | POA: Diagnosis not present

## 2021-02-02 NOTE — Patient Instructions (Addendum)
PLEASE FEEL FREE TO CALL OUR OFFICE WITH ANY PROBLEMS OR QUESTIONS [phone])   AMANTADINE TAPER 100MG  TWICE DAILY FOR 4 DAYS  THEN 100MG  ONCE DAILY FOR 4 DAYS  THEN STOP.                    PREVAGEN FOR MEMORY?

## 2021-02-02 NOTE — Progress Notes (Signed)
Subjective:    Patient ID: Beverly [name], female    DOB: August 17, 1985, 35 y.o.   MRN: [id number]  HPI  Beverly [name] is here in follow up of her anoxic injury. She continues to struggle with po intake.  Mom is still supplementing her regularly with Ensure through her PEG.  Patient appears to be hungry but food becomes easily stuck on her tongue and mouth.  Mom held her amantadine for a couple days and felt that she was more alert and seemed to do better with her p.o. intake as well.  Sleep is been reasonable.  Mom does not feel that she is depressed and patient herself denies it.  She had an ICD placed in October and has done well without so far.   Pain Inventory Average Pain 0 Pain Right Now 0 My pain is  no pain  LOCATION OF PAIN  no pain  BOWEL Number of stools per week: 5-7  BLADDER Pads Bladder incontinence Yes   Mobility walk with assistance use a walker how many minutes can you walk? 5 minutes ability to climb steps?  yes do you drive?  no use a wheelchair needs help with transfers Do you have any goals in this area?  yes  Function disabled: date disabled 09/2019 I need assistance with the following:  feeding, dressing, bathing, toileting, meal prep, household duties, and shopping Do you have any goals in this area?  yes  Neuro/Psych tremor trouble walking confusion  Prior Studies Any changes since last visit?  no  Physicians involved in your care Any changes since last visit?  no   Family History  Problem Relation Age of Onset   Hyperlipidemia Mother    Hypertension Mother    Diabetes Maternal Grandmother    Seizures Maternal Grandmother    Thyroid disease Maternal Grandmother    Diabetes Paternal Grandmother    Rashes / Skin problems Son        ezcema   Seizures Son    Social History   Socioeconomic History   Marital status: Single    Spouse name: Not on file   Number of children: 3   Years of education: Not on file   Highest education level:  Not on file  Occupational History   Occupation: oncology    Employer: [employer]    Comment: NA  Tobacco Use   Smoking status: Never   Smokeless tobacco: Never  Vaping Use   Vaping Use: Never used  Substance and Sexual Activity   Alcohol use: Not Currently    Alcohol/week: 0.0 standard drinks    Comment: quit 2017   Drug use: No    Frequency: 5.0 times per week    Types: Marijuana    Comment: stopped when she found out she was pregnant   Sexual activity: Not Currently    Partners: Male    Birth control/protection: None  Other Topics Concern   Not on file  Social History Narrative   01/06/20 lives with mom and her children   Social Determinants of Health   Financial Resource Strain: Not on file  Food Insecurity: Not on file  Transportation Needs: Not on file  Physical Activity: Not on file  Stress: Not on file  Social Connections: Not on file   Past Surgical History:  Procedure Laterality Date   CARDIAC CATHETERIZATION     IR GASTROSTOMY TUBE MOD SED  10/07/2019   IR GASTROSTOMY TUBE REMOVAL  07/02/2020   LEFT HEART CATH AND CORONARY  ANGIOGRAPHY N/A 09/23/2019   Procedure: LEFT HEART CATH AND CORONARY ANGIOGRAPHY;  Surgeon: Yvonne Kendall, MD;  Location: [facility] INVASIVE CV LAB;  Service: Cardiovascular;  Laterality: N/A;   SUBQ ICD IMPLANT N/A 11/26/2020   Procedure: SUBQ ICD IMPLANT;  Surgeon: Lanier Prude, MD;  Location: [facility] INVASIVE CV LAB;  Service: Cardiovascular;  Laterality: N/A;   Past Medical History:  Diagnosis Date   Brain injury    CAD (coronary artery disease)    HLD (hyperlipidemia)    Hypertension    last pregnancy   Ischemic cardiomyopathy    STEMI (ST elevation myocardial infarction) (HCC) 09/2019   SCAD with aneurysmal dilation of proximal LAD.   Vaginal Pap smear, abnormal    when she was 35yo   Ventricular fibrillation (HCC) 09/23/2019   BP 111/74    Pulse 74    Ht 5\' 8"  (1.727 m)    Wt 156 lb 9.6 oz (71 kg)    SpO2 99%    BMI 23.81  kg/m   Opioid Risk Score:   Fall Risk Score:  `1  Depression screen PHQ 2/9  Depression screen [facility] 2/9 09/08/2020 07/14/2020 04/15/2020 03/16/2020 02/12/2020  Decreased Interest 0 0 0 0 2  Down, Depressed, Hopeless 0 0 0 0 0  PHQ - 2 Score 0 0 0 0 2  Altered sleeping - 0 - - 2  Tired, decreased energy - - - - 1  Change in appetite - - - - 3  Feeling bad or failure about yourself  - - - - 0  Trouble concentrating - - - - 3  Moving slowly or fidgety/restless - - - - 3  Suicidal thoughts - - - - 0  PHQ-9 Score - 0 - - 14  Difficult doing work/chores - - - - Extremely dIfficult    Review of Systems  Genitourinary:        Incontinence  Musculoskeletal:  Positive for gait problem.  Neurological:  Positive for tremors.  Psychiatric/Behavioral:  Positive for confusion.   All other systems reviewed and are negative.     Objective:   Physical Exam  General: No acute distress HEENT: NCAT, EOMI, oral membranes moist Cards: reg rate  Chest: normal effort Abdomen: Soft, NT, ND, PEG Skin: dry, intact Extremities: no edema Psych: pleasant and somewhat engaging Skin: Warm and dry.  Intact.    Musc: No edema in extremities.  No tenderness in extremities. Neuro: Alert to person, not place, date. Recognizes mom.  Speech dysarthric.  Fairly attentive but can be distracted easily.  Motor: vision intact. grossly 5/5 throughout,  motor apraxia ongoing.  Still very delayed with processing.  Poor oral motor control.  No resting tone.           Assessment & Plan:  1. Anoxic BI - substantial attention, initiation deficits as well as memory deficits             MRI/CTs unremarkable             -Recent ICD placement.  Consider Ritalin trial if okay with cardiology.  Would start with a very low dose such as 2.5 mg daily -Given recent experience with amantadine will taper to off. -Resume therapies in January.             -Consider Prevagen over-the-counter for memory 2. Dysphagia:             -t  Mom continues pushing oral food but patient still struggling with oral motor apraxia and  coordination.             Cont PEG as a back up for now                3. Gait abnormality             Cont ambulation with assistance               RW for balance             PT as possible insurance permitting 3. Sleep disturbance             Melatonin 3 mg daily --can take with trazodone            Discussed sleep hygiene and routine             -trazodone at night time scheduled.  this has helped her sleep/wake quite a bit   15 minutes of face to face patient care time were spent during this visit. All questions were encouraged and answered. Follow up in 2 mos

## 2021-02-03 ENCOUNTER — Ambulatory Visit: Payer: Medicaid Other | Admitting: Physical Therapy

## 2021-02-03 ENCOUNTER — Encounter: Payer: Medicaid Other | Admitting: Speech Pathology

## 2021-03-02 ENCOUNTER — Other Ambulatory Visit: Payer: Self-pay

## 2021-03-02 ENCOUNTER — Ambulatory Visit (INDEPENDENT_AMBULATORY_CARE_PROVIDER_SITE_OTHER): Payer: Medicaid Other | Admitting: Cardiology

## 2021-03-02 ENCOUNTER — Telehealth: Payer: Self-pay | Admitting: Physical Medicine & Rehabilitation

## 2021-03-02 ENCOUNTER — Encounter: Payer: Self-pay | Admitting: Cardiology

## 2021-03-02 VITALS — BP 96/76 | HR 80 | Ht 68.0 in | Wt 158.0 lb

## 2021-03-02 DIAGNOSIS — G931 Anoxic brain damage, not elsewhere classified: Secondary | ICD-10-CM | POA: Diagnosis not present

## 2021-03-02 DIAGNOSIS — I255 Ischemic cardiomyopathy: Secondary | ICD-10-CM

## 2021-03-02 DIAGNOSIS — I469 Cardiac arrest, cause unspecified: Secondary | ICD-10-CM

## 2021-03-02 DIAGNOSIS — I4901 Ventricular fibrillation: Secondary | ICD-10-CM

## 2021-03-02 DIAGNOSIS — Z95810 Presence of automatic (implantable) cardiac defibrillator: Secondary | ICD-10-CM

## 2021-03-02 DIAGNOSIS — I5022 Chronic systolic (congestive) heart failure: Secondary | ICD-10-CM | POA: Diagnosis not present

## 2021-03-02 LAB — CUP PACEART INCLINIC DEVICE CHECK
Date Time Interrogation Session: 20230125160151
Implantable Pulse Generator Implant Date: 20221021
Pulse Gen Serial Number: 161805

## 2021-03-02 MED ORDER — METHYLPHENIDATE HCL 5 MG PO TABS: 5.0000 mg | ORAL_TABLET | Freq: Two times a day (BID) | ORAL | 0 refills | Status: DC

## 2021-03-02 NOTE — Telephone Encounter (Signed)
Questions about Medication about went off Amidine  should she be taking Ritalin. Patient ask if Dr. Caren Hazy talked to to her heart doctor if she could please get a call back

## 2021-03-02 NOTE — Patient Instructions (Addendum)
Medications: Your physician recommends that you continue on your current medications as directed. Please refer to the Current Medication list given to you today. *If you need a refill on your cardiac medications before your next appointment, please call your pharmacy*  Lab Work: None. If you have labs (blood work) drawn today and your tests are completely normal, you will receive your results only by: MyChart Message (if you have MyChart) OR A paper copy in the mail If you have any lab test that is abnormal or we need to change your treatment, we will call you to review the results.  Testing/Procedures: None.  Follow-Up: At [facility], you and your health needs are our priority.  As part of our continuing mission to provide you with exceptional heart care, we have created designated Provider Care Teams.  These Care Teams include your primary Cardiologist (physician) and Advanced Practice Providers (APPs -  Physician Assistants and Nurse Practitioners) who all work together to provide you with the care you need, when you need it.  Your physician wants you to follow-up in: 9 months with  one of the following Advanced Practice Providers on your designated Care Team:    Ward Givens, NP Eula Listen PA Cadence Fransico Michael PA    You will receive a reminder letter in the mail two months in advance. If you don't receive a letter, please call our office to schedule the follow-up appointment.  Remote monitoring is used to monitor your ICD from home. This monitoring reduces the number of office visits required to check your device to one time per year. It allows [place] to keep an eye on the functioning of your device to ensure it is working properly. You are scheduled for a device check from home on 03/10/21. You may send your transmission at any time that day. If you have a wireless device, the transmission will be sent automatically. After your physician reviews your transmission, you will receive a postcard  with your next transmission date.  We recommend signing up for the patient portal called "MyChart".  Sign up information is provided on this After Visit Summary.  MyChart is used to connect with patients for Virtual Visits (Telemedicine).  Patients are able to view lab/test results, encounter notes, upcoming appointments, etc.  Non-urgent messages can be sent to your provider as well.   To learn more about what you can do with MyChart, go to [email or web address].    Any Other Special Instructions Will Be Listed Below (If Applicable).

## 2021-03-02 NOTE — Addendum Note (Signed)
Addended by: Faith Rogue T on: 03/02/2021 05:06 PM   Modules accepted: Orders

## 2021-03-02 NOTE — Progress Notes (Signed)
Electrophysiology Office Follow up Visit Note:    Date:  03/02/2021   ID:  Beverly [name], DOB 07/02/1985, MRN [id number]  PCP:  Delman Cheadle, PA  [facility] Cardiologist:  Yvonne Kendall, MD  [facility] Electrophysiologist:  Lanier Prude, MD    Interval History:    Beverly [name] is a 36 y.o. female who presents for a follow up visit.  She is with her mother today in clinic.  She is post subcutaneous ICD implant November 29, 2020.  She is done well since the implant.  No high-voltage therapies have been delivered.  Incisions have healed well.       Past Medical History:  Diagnosis Date   Brain injury    CAD (coronary artery disease)    HLD (hyperlipidemia)    Hypertension    last pregnancy   Ischemic cardiomyopathy    STEMI (ST elevation myocardial infarction) (HCC) 09/2019   SCAD with aneurysmal dilation of proximal LAD.   Vaginal Pap smear, abnormal    when she was 36yo   Ventricular fibrillation (HCC) 09/23/2019    Past Surgical History:  Procedure Laterality Date   CARDIAC CATHETERIZATION     IR GASTROSTOMY TUBE MOD SED  10/07/2019   IR GASTROSTOMY TUBE REMOVAL  07/02/2020   LEFT HEART CATH AND CORONARY ANGIOGRAPHY N/A 09/23/2019   Procedure: LEFT HEART CATH AND CORONARY ANGIOGRAPHY;  Surgeon: Yvonne Kendall, MD;  Location: [facility] INVASIVE CV LAB;  Service: Cardiovascular;  Laterality: N/A;   SUBQ ICD IMPLANT N/A 11/26/2020   Procedure: SUBQ ICD IMPLANT;  Surgeon: Lanier Prude, MD;  Location: [facility] INVASIVE CV LAB;  Service: Cardiovascular;  Laterality: N/A;    Current Medications: Current Meds  Medication Sig   Apoaequorin (PREVAGEN PO) Take by mouth.   aspirin 81 MG chewable tablet PLACE 1 TABLET (81 MG TOTAL) INTO FEEDING TUBE DAILY.   atorvastatin (LIPITOR) 80 MG tablet PLACE 1 TABLET (80 MG TOTAL) INTO FEEDING TUBE DAILY.   carvedilol (COREG) 6.25 MG tablet PLACE 1 TABLET (6.25 MG TOTAL) INTO FEEDING TUBE 2 (TWO) TIMES DAILY  WITH A MEAL.   clobetasol (TEMOVATE) 0.05 % external solution Apply topically 2 (two) times daily.   Ensure Plus (ENSURE PLUS) LIQD Take by mouth. Drinks 5-6 cans per day   Melatonin 5 MG CAPS Place 5 mg into feeding tube at bedtime.   Multiple Vitamin (MULTIVITAMIN WITH MINERALS) TABS tablet Place 1 tablet into feeding tube daily.   traZODone (DESYREL) 50 MG tablet TAKE 1 TABLET BY MOUTH AT BEDTIME AS NEEDED FOR SLEEP   triamcinolone cream (KENALOG) 0.1 % SMARTSIG:Sparingly Topical Twice Daily   Water For Irrigation, Sterile (FREE WATER) SOLN Place 200 mLs into feeding tube every 6 (six) hours.     Allergies:   Patient has no known allergies.   Social History   Socioeconomic History   Marital status: Single    Spouse name: Not on file   Number of children: 3   Years of education: Not on file   Highest education level: Not on file  Occupational History   Occupation: oncology    Employer: [employer]    Comment: NA  Tobacco Use   Smoking status: Never   Smokeless tobacco: Never  Vaping Use   Vaping Use: Never used  Substance and Sexual Activity   Alcohol use: Not Currently    Alcohol/week: 0.0 standard drinks    Comment: quit 2017   Drug use: No    Frequency: 5.0  times per week    Types: Marijuana    Comment: stopped when she found out she was pregnant   Sexual activity: Not Currently    Partners: Male    Birth control/protection: None  Other Topics Concern   Not on file  Social History Narrative   01/06/20 lives with mom and her children   Social Determinants of Health   Financial Resource Strain: Not on file  Food Insecurity: Not on file  Transportation Needs: Not on file  Physical Activity: Not on file  Stress: Not on file  Social Connections: Not on file     Family History: The patient's family history includes Diabetes in her maternal grandmother and paternal grandmother; Hyperlipidemia in her mother; Hypertension in her mother; Rashes / Skin problems in  her son; Seizures in her maternal grandmother and son; Thyroid disease in her maternal grandmother.  ROS:   Please see the history of present illness.    All other systems reviewed and are negative.  EKGs/Labs/Other Studies Reviewed:    The following studies were reviewed today:  March 02, 2021 in clinic device interrogation personally reviewed Lead parameters are stable.  No high-voltage therapies have been delivered.   EKG:  The ekg ordered today demonstrates sinus rhythm.  Anterior infarct.  Recent Labs: 03/15/2020: ALT 25 11/03/2020: BUN 12; Creatinine, Ser 0.59; Hemoglobin 13.1; Platelets 360; Potassium 4.5; Sodium 136  Recent Lipid Panel    Component Value Date/Time   CHOL 107 03/15/2020 1515   TRIG 137 03/15/2020 1515   HDL 42 03/15/2020 1515   CHOLHDL 2.5 03/15/2020 1515   CHOLHDL 4.6 09/26/2019 0841   VLDL 50 (H) 09/26/2019 0841   LDLCALC 41 03/15/2020 1515   LDLDIRECT 48 03/15/2020 1515    Physical Exam:    VS:  BP 96/76 (BP Location: Right Arm, Patient Position: Sitting, Cuff Size: Normal)    Pulse 80    Ht 5\' 8"  (1.727 m)    Wt 158 lb (71.7 kg)    SpO2 98%    BMI 24.02 kg/m     Wt Readings from Last 3 Encounters:  03/02/21 158 lb (71.7 kg)  02/02/21 156 lb 9.6 oz (71 kg)  12/23/20 152 lb (68.9 kg)     GEN:  Well nourished, well developed in no acute distress HEENT: Normal NECK: No JVD; No carotid bruits LYMPHATICS: No lymphadenopathy CARDIAC: RRR, no murmurs, rubs, gallops.  Mid axillary and subxiphoid incisions well-healed RESPIRATORY:  Clear to auscultation without rales, wheezing or rhonchi  ABDOMEN: Soft, non-tender, non-distended MUSCULOSKELETAL:  No edema; No deformity  SKIN: Warm and dry NEUROLOGIC:  Alert.  Difficulty with speech. PSYCHIATRIC:  Normal affect        ASSESSMENT:    1. Cardiac arrest with ventricular fibrillation (HCC)   2. Chronic HFrEF (heart failure with reduced ejection fraction) (HCC)   3. Ischemic cardiomyopathy    4. Anoxic brain injury (HCC)   5. 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  No high-voltage therapies have been delivered.  Continue remote monitoring.  Follow-up with me in 9 months or sooner as needed.   Medication Adjustments/Labs and Tests Ordered: Current medicines are reviewed at length with the patient today.  Concerns regarding medicines are outlined above.  Orders Placed This Encounter  Procedures  CUP PACEART INCLINIC DEVICE CHECK   No orders of the defined types were placed in this encounter.    Signed, Steffanie Dunn, MD, [facility], [facility] 03/02/2021 7:54 PM    Electrophysiology [facility]

## 2021-03-02 NOTE — Telephone Encounter (Signed)
I spoke with Dallas's mom. We'll start ritalin 5mg  in am for one week then bid (breakfast and lunch) thereafter. She'll keep an eye on her blood pressure/pulse.

## 2021-03-10 ENCOUNTER — Ambulatory Visit: Payer: Medicaid Other | Attending: Physician Assistant | Admitting: Speech Pathology

## 2021-03-10 ENCOUNTER — Ambulatory Visit (INDEPENDENT_AMBULATORY_CARE_PROVIDER_SITE_OTHER): Payer: Medicaid Other

## 2021-03-10 ENCOUNTER — Other Ambulatory Visit: Payer: Self-pay

## 2021-03-10 DIAGNOSIS — I255 Ischemic cardiomyopathy: Secondary | ICD-10-CM | POA: Diagnosis not present

## 2021-03-10 DIAGNOSIS — G931 Anoxic brain damage, not elsewhere classified: Secondary | ICD-10-CM | POA: Diagnosis present

## 2021-03-10 DIAGNOSIS — R278 Other lack of coordination: Secondary | ICD-10-CM | POA: Insufficient documentation

## 2021-03-10 DIAGNOSIS — R269 Unspecified abnormalities of gait and mobility: Secondary | ICD-10-CM | POA: Insufficient documentation

## 2021-03-10 DIAGNOSIS — R471 Dysarthria and anarthria: Secondary | ICD-10-CM

## 2021-03-10 DIAGNOSIS — R262 Difficulty in walking, not elsewhere classified: Secondary | ICD-10-CM | POA: Insufficient documentation

## 2021-03-10 DIAGNOSIS — R2681 Unsteadiness on feet: Secondary | ICD-10-CM | POA: Insufficient documentation

## 2021-03-10 DIAGNOSIS — R2689 Other abnormalities of gait and mobility: Secondary | ICD-10-CM | POA: Diagnosis present

## 2021-03-10 DIAGNOSIS — R1312 Dysphagia, oropharyngeal phase: Secondary | ICD-10-CM | POA: Diagnosis present

## 2021-03-10 DIAGNOSIS — R41841 Cognitive communication deficit: Secondary | ICD-10-CM | POA: Diagnosis not present

## 2021-03-10 DIAGNOSIS — M6281 Muscle weakness (generalized): Secondary | ICD-10-CM | POA: Diagnosis present

## 2021-03-10 LAB — CUP PACEART REMOTE DEVICE CHECK
Battery Remaining Percentage: 97 %
Date Time Interrogation Session: 20230202080900
Implantable Pulse Generator Implant Date: 20221021
Pulse Gen Serial Number: 161805

## 2021-03-10 NOTE — Therapy (Signed)
[place] MAIN [facility] SERVICES [address] [place], [place], [address] Phone: [phone]   Fax:  [phone]  Speech Language Pathology Evaluation  Patient Details  Name: Beverly [name] MRN: [id number] Date of Birth: 18-Apr-1985 Referring Provider (SLP): Stephania Fragmin   Encounter Date: 03/10/2021   End of Session - 03/10/21 1608     Visit Number 1    Number of Visits 25    Date for SLP Re-Evaluation 09/01/21    Authorization Type Medicaid [place] Access    Authorization Time Period 03/10/2021 thru 09/01/2021    Authorization - Visit Number 1    Authorization - Number of Visits 12    Progress Note Due on Visit 10    SLP Start Time 1400    SLP Stop Time  1500    SLP Time Calculation (min) 60 min    Activity Tolerance Patient tolerated treatment well             Past Medical History:  Diagnosis Date   Brain injury    CAD (coronary artery disease)    HLD (hyperlipidemia)    Hypertension    last pregnancy   Ischemic cardiomyopathy    STEMI (ST elevation myocardial infarction) ([place]) 09/2019   SCAD with aneurysmal dilation of proximal LAD.   Vaginal Pap smear, abnormal    when she was 36yo   Ventricular fibrillation ([place]) 09/23/2019    Past Surgical History:  Procedure Laterality Date   CARDIAC CATHETERIZATION     IR GASTROSTOMY TUBE MOD SED  10/07/2019   IR GASTROSTOMY TUBE REMOVAL  07/02/2020   LEFT HEART CATH AND CORONARY ANGIOGRAPHY N/A 09/23/2019   Procedure: LEFT HEART CATH AND CORONARY ANGIOGRAPHY;  Surgeon: Nelva Bush, MD;  Location: [place] [facility];  Service: Cardiovascular;  Laterality: N/A;   SUBQ ICD IMPLANT N/A 11/26/2020   Procedure: SUBQ ICD IMPLANT;  Surgeon: Vickie Epley, MD;  Location: [place] [facility];  Service: Cardiovascular;  Laterality: N/A;    There were no vitals filed for this visit.   Subjective Assessment - 03/10/21 1554     Subjective pt pleasant, motivated,  accompanied by her mother    Patient is accompained by: Family member    Currently in Pain? No/denies                SLP Evaluation [facility] - 03/10/21 1554       SLP Visit Information   SLP Received On 03/10/21    Referring Provider (SLP) Stephania Fragmin    Onset Date 09/23/2019    Medical Diagnosis Anoxic Brain Injury      General Information   HPI Pt is a 36 year old female who suffered an anoxic brain injury as a result of VF arrest 2 weeks post-partum. Pt currently has a PEG which is used as her primary source of nutrition/hydration. Pt received Outpatient ST services from 12/10/2019 thru 04/08/2020 with further services thru [facility].    Behavioral/Cognition pleasant, mother provides history d/t severity of pt's cognitive impairments    Mobility Status arrived in [place]   Has the patient fallen in the past 6 months No    Has the patient had a decrease in activity level because of a fear of falling?  No    Is the patient reluctant to leave their home because of a fear of falling?  No      Prior Functional Status  Cognitive/Linguistic Baseline Baseline deficits    Baseline deficit details dependent on her mother for all care     Lives With Family    Available Support Family      Cognition   Overall Cognitive Status Difficult to assess    Area of Impairment Memory;Awareness    Difficult to assess due to --   moderate to severe dysarthria   Orientation Level Person    Current Attention Level Sustained    Memory Decreased short-term memory    Memory Comments unable to recall number of children, ages of children    Following Commands Follows multi-step commands inconsistently    Safety/Judgement Decreased awareness of safety;Decreased awareness of deficits    Awareness Emergent    Attention Selective    Sustained Attention Appears intact    Selective Attention Impaired    Selective Attention Impairment Verbal basic;Functional basic     Memory Impaired    Memory Impairment Retrieval deficit;Decreased short term memory;Decreased recall of new information    Decreased Short Term Memory Verbal basic;Functional basic    Awareness Impaired    Awareness Impairment Emergent impairment      Auditory Comprehension   Overall Auditory Comprehension Appears within functional limits for tasks assessed      Visual Recognition/Discrimination   Discrimination Within Function Limits      Reading Comprehension   Reading Status Not tested      Expression   Primary Mode of Expression Verbal      Verbal Expression   Overall Verbal Expression Impaired    Initiation Impaired    Automatic Speech Name;Social Response    Level of Generative/Spontaneous Verbalization Phrase      Oral Motor/Sensory Function   Overall Oral Motor/Sensory Function Appears within functional limits for tasks assessed      Motor Speech   Overall Motor Speech Impaired    Respiration Impaired    Level of Impairment Phrase    Resonance Within functional limits    Articulation Impaired    Level of Impairment Word    Intelligibility Intelligibility reduced    Word 25-49% accurate    Phrase 25-49% accurate    Sentence Not tested    Conversation Not tested    Motor Planning Impaired    Level of Impairment Phrase    Motor Speech Errors Not applicable    Effective Techniques Over-articulate      Standardized Assessments   Standardized Assessments  --   Quick Assess for AAC via [place] app                            SLP Education - 03/10/21 1608     Education Details Speech Generating Device    Person(s) Educated Patient;Parent(s)    Methods Explanation;Demonstration;Verbal cues    Comprehension Verbalized understanding;Need further instruction              SLP Short Term Goals - 03/10/21 1621       SLP SHORT TERM GOAL #1   Title Pt will assist in selecting icons and information to customize SGD.    Baseline new  goal, able to select icons on trial screen    Time 10    Period Weeks    Status New      SLP SHORT TERM GOAL #2   Title Pt will use device to state the names of her children and their ages in 70 out of 38 opportunities.    Baseline  inaccurate recall of their names and ages    Time 29    Period --   sessions   Status New      SLP SHORT TERM GOAL #3   Title Pt will use SGD to communicate basic need at home as reported by pt's mother.    Baseline Pt is reliant on her mother, pt doesn't initiate any communication about wants/needs    Time 10    Period --   session   Status New              SLP Long Term Goals - 03/10/21 1623       SLP LONG TERM GOAL #1   Title Pt will use multimodal communication to accurately answer basic questions about her children.    Baseline unable to recall 3 children, their ages and their names are untelligible to unfamiliar listener    Time 12    Period Weeks    Status New    Target Date 06/02/21              Plan - 03/10/21 1610     Clinical Impression Statement Pt presents with worsening oropharyngeal abilities as evidenced by inability to produce a swallow despite oral stimulation (no hyolaryngeal movement felt by palpation). Pt actively holding salvia in her mouth with need to spit x 4 during session.   In addition, pt continues to present with moderate impairments in memory and awareness of deficits. she is reliant on her mother to confirm all answers. Her cognitive deficits are further exacerbated by severe dysarthria c/b imprecise articulation and reduced breath support resulting in ~ 25% speech intelligibility at the phrase level to this unfamiliar listener.   From reviewing pt's chart, it appears that some of the above are more chronic in nature but in an effort to increase pt's functional communication/independence discussed possibility of using a SGD. [name] reports that pt is using an app on her iPad with good ability.   As such,  pt completed the Quick Assess for AAC via [place] app. PHYSICAL ASSESSMENT: Touch accuracy 100% for icons ranging large to small;Marland Kitchen Vision field test 100% accuracy; LANGUAGE ASSESSMENT: Phrase Completion - High; Categorization - Medium; Repetition - Medium, Confrontational Naming - High, Conversational Speech - low; DEVICE SIMULATION: Simple Simulation - Completed. While she had difficulty selecting icons d/t ideomotor apraxia, she was occasionally able to hold stylus and make a couple of selection. In addition trials with clinic device, pt was able to accurately select icons in filed of 3 centering around feelings, day of week.   Pt and her mother expressed great interest in trialing a SGD. At this time, she appears to be a good candidate. Recommend skilled ST intervention to target pt's cognitive communication impairments to train in using SGD for improved functional communication and reduced caregiver burden.    Speech Therapy Frequency 1x /week    Duration --   25 weeks   Treatment/Interventions SLP instruction and feedback;Functional tasks;Multimodal communication approach;Patient/family education    Potential to Achieve Goals Good    Consulted and Agree with Plan of Care Patient;Family member/caregiver    Family Member Consulted pt's mother             Patient will benefit from skilled therapeutic intervention in order to improve the following deficits and impairments:   Cognitive communication deficit  Dysarthria and anarthria  Dysphagia, oropharyngeal phase  Anoxic brain injury [facility])    Problem List Patient Active Problem List  Diagnosis Date Noted   S/P ICD (internal cardiac defibrillator) procedure 11/27/2020   Hx of Cardiac arrest ([place]) 11/26/2020   Abnormality of gait 03/16/2020   Sleep disturbance 03/16/2020   Oropharyngeal dysphagia 02/12/2020   Coronary artery disease involving native coronary artery of native heart without angina pectoris 12/12/2019   Ischemic  cardiomyopathy 12/12/2019   Brain injury    Prediabetes    Anoxic brain injury ([place]) 11/05/2019   Dysphagia 11/05/2019   Physical deconditioning 11/05/2019   Acute delirium 11/05/2019   Respiratory failure ([place])    Acute metabolic encephalopathy    Encounter for central line placement    Ventricular fibrillation ([place]) 09/23/2019   Acute combined systolic and diastolic heart failure ([place]) 09/23/2019   Ventricular tachycardia, polymorphic 09/23/2019   Pelvic pain affecting pregnancy 10/15/2015   Supervision of normal pregnancy in third trimester 09/28/2015   Poor weight gain of pregnancy 09/28/2015   Iron deficiency anemia of pregnancy 09/01/2015   Increased BMI (body mass index) 07/29/2015   Chiquita Heckert B. Rutherford Nail M.S., CCC-SLP, [place] Pathologist Rehabilitation Services Office [phone], [place] 03/10/2021, 4:26 PM  [place] MAIN [facility] SERVICES [address] [place], [place], [address] Phone: [phone]   Fax:  [phone]  Name: Beverly [name] MRN: [id number] Date of Birth: 09/19/1985

## 2021-03-10 NOTE — Patient Instructions (Signed)
Send pictures to SLP to upload to device

## 2021-03-16 NOTE — Progress Notes (Signed)
Remote ICD transmission.   

## 2021-03-17 ENCOUNTER — Ambulatory Visit: Payer: Medicaid Other

## 2021-03-17 ENCOUNTER — Other Ambulatory Visit: Payer: Self-pay

## 2021-03-17 VITALS — BP 110/67 | HR 72 | Ht 69.0 in | Wt 156.0 lb

## 2021-03-17 DIAGNOSIS — R269 Unspecified abnormalities of gait and mobility: Secondary | ICD-10-CM

## 2021-03-17 DIAGNOSIS — R41841 Cognitive communication deficit: Secondary | ICD-10-CM | POA: Diagnosis not present

## 2021-03-17 DIAGNOSIS — R2681 Unsteadiness on feet: Secondary | ICD-10-CM

## 2021-03-17 DIAGNOSIS — R262 Difficulty in walking, not elsewhere classified: Secondary | ICD-10-CM

## 2021-03-17 DIAGNOSIS — R2689 Other abnormalities of gait and mobility: Secondary | ICD-10-CM

## 2021-03-17 DIAGNOSIS — R278 Other lack of coordination: Secondary | ICD-10-CM

## 2021-03-17 DIAGNOSIS — M6281 Muscle weakness (generalized): Secondary | ICD-10-CM

## 2021-03-17 NOTE — Therapy (Signed)
[place] [facility] MAIN [facility] [address] [place], [place], [address] Phone: [phone]   Fax:  [phone]  Physical Therapy Evaluation  Patient Details  Name: Beverly BrunsShatoya A [name] MRN: [id number] Date of Birth: 04/01/1985 Referring Provider (PT): Jerrilyn CairoSwartz   Encounter Date: 03/17/2021   PT End of Session - 03/17/21 1727     Visit Number 1    Number of Visits 12    Date for PT Re-Evaluation 06/09/21    Authorization Type Medicaid approval- 27 visits for OT/PT/SLP combined    Authorization Time Period Initial Certification  03/17/2021- 06/09/2021    Progress Note Due on Visit 10    PT Start Time 1550    PT Stop Time 1642    PT Time Calculation (min) 52 min    Equipment Utilized During Treatment Gait belt    Activity Tolerance Patient tolerated treatment well    Behavior During Therapy Impulsive             Past Medical History:  Diagnosis Date   Brain injury    CAD (coronary artery disease)    HLD (hyperlipidemia)    Hypertension    last pregnancy   Ischemic cardiomyopathy    STEMI (ST elevation myocardial infarction) ([facility]) 09/2019   SCAD with aneurysmal dilation of proximal LAD.   Vaginal Pap smear, abnormal    when she was 36yo   Ventricular fibrillation ([facility]) 09/23/2019    Past Surgical History:  Procedure Laterality Date   CARDIAC CATHETERIZATION     IR GASTROSTOMY TUBE MOD SED  10/07/2019   IR GASTROSTOMY TUBE REMOVAL  07/02/2020   LEFT HEART CATH AND CORONARY ANGIOGRAPHY N/A 09/23/2019   Procedure: LEFT HEART CATH AND CORONARY ANGIOGRAPHY;  Surgeon: Yvonne KendallEnd, Christopher, MD;  Location: [facility] INVASIVE CV LAB;  Service: Cardiovascular;  Laterality: N/A;   SUBQ ICD IMPLANT N/A 11/26/2020   Procedure: SUBQ ICD IMPLANT;  Surgeon: Lanier PrudeLambert, Cameron T, MD;  [facility];  Service: Cardiovascular;  Laterality: N/A;    Vitals:   03/17/21 1658  BP: 110/67  Pulse: 72  SpO2: 99%  Weight: 156 lb (70.8 kg)  Height: 5\' 9"   (1.753 m)      Subjective Assessment - 03/17/21 1700     Subjective Patient presents with her mother today and reports she is here to work on her balance and improve her safety with mobility and get stronger.    Patient is accompained by: Family member   mom   Pertinent History Patient is a 36 year old with diagnois of anoxic brain injury. She was receiving outpatient PT until Feb. 2022 last year but ran out of visits. Patient has past medical history of Anoxic Brain Injury 09/2019, CAD, HLD, HTN, ISCHEMIC CARDIMYOPATHY, STEMI, V-FIB, ICD on 11/29/2020  .On 09/23/19 patient presented to ED 2 weeks postpartum (had emergent C-Section) with chest pain. In ED sustained VFib Cardiac Arrest requiring intubation. EKG showed ST elevation, Cath Lab showed aneurysmal dissection of the LAD was identified. Course complicated by encephalopathy, anoxic brain injury, enterobacterial PNA. Patient is currently living with mom. Mother reports she is abel to walk some with a walker with supervision but primarily ambulates with HHA of mother who is her guardian. Mother also reports she requires assist for all transfers and ADL's.    Limitations Sitting;Standing;Walking;House hold activities    How long can you sit comfortably? no issues    Patient Stated Goals walk better    Currently in Pain? No/denies  Patient reported goal: Mother reports that she would like for her daughter to work on her strength, Improve her mobility and require less assistance.    OBJECTIVE  Musculoskeletal Tremor: Absent Bulk: Normal Tone: Normal, no clonus   Posture No gross abnormalities noted in standing or seated posture   Gait Patient ambulated approx 75 feet total today using front wheeled walker with use of gait belt, CGA- Patient demo short reciprocal steps and tendency to push walker too far out in front or her requiring VC.   Strength Difficulty assessing due to impaired cognition- Patient inconsistent  following VC and Visual cues- Grossly 4/5 throughout B UE/LE's. No tone observed and no spasms with testing today.   NEUROLOGICAL:   Sensation Grossly intact to light touch bilateral UEs/LEs as determined by testing dermatomes C2-T2/L2-S2 respectively. Proprioception and hot/cold testing deferred on this date   Coordination/Cerebellar Finger to Nose: impaired Heel to Shin: impaired Rapid alternating movements: Impaired   FUNCTIONAL OUTCOME MEASURES   Results Comments              TUG 43.49 seconds- using RW Fall risk, in need of intervention  5TSTS 52.44 seconds No UE- Increased VC to perform  FOTO 3 Mother able to answer questions  10 Meter Gait Speed Self-selected: 32.49s = 0.31 m/s;  Below normative values for full community ambulation                ASSESSMENT Clinical Impression: Pt is a pleasant  36 year-old female referred with diagnosis of Anoxic brain injury. She was receiving outpatient PT services in 2022 yet limited by visits and presents today to continue in the new calendar year. PT examination reveals deficits including impaired functional strength and coordination requiring assist with mobility and decreased balance placing her at risk for falling. She will benefit from skilled PT services to address these deficits, improve balance, and decrease risk for future falls.            [facility] PT Assessment - 03/17/21 1718       Assessment   Medical Diagnosis anoxic brain Injury    Referring Provider (PT) Riley Kill    Onset Date/Surgical Date 09/23/19    Hand Dominance Left    Next MD Visit 03/22/2021    Prior Therapy outpt. PT 2022      Precautions   Precautions Fall      Restrictions   Weight Bearing Restrictions No      Balance Screen   Has the patient fallen in the past 6 months No    Has the patient had a decrease in activity level because of a fear of falling?  Yes    Is the patient reluctant to leave their home because of a fear of falling?   Yes      Home Environment   Living Environment Private residence    Living Arrangements Parent   Mother   Available Help at Discharge Family    Type of Home House      Prior Function   Level of Independence Needs assistance with ADLs      Cognition   Overall Cognitive Status Difficult to assess    Area of Impairment Memory;Awareness    Difficult to assess due to Impaired communication    Orientation Level Person      Observation/Other Assessments   Focus on Therapeutic Outcomes (FOTO)  45  Objective measurements completed on examination: See above findings.                PT Education - 03/17/21 1726     Education Details Discussed role of PT and plan of care for upcoming subsequent visits    Person(s) Educated Patient    Methods Explanation;Demonstration;Tactile cues;Verbal cues    Comprehension Verbalized understanding;Returned demonstration;Verbal cues required;Tactile cues required;Need further instruction              PT Short Term Goals - 03/17/21 1754       PT SHORT TERM GOAL #1   Title Patient will perform all transfers with Stand by assist using least restrictive AD,  BUE Support, and reaching back for safety to improve her functional independence and decrease fall risk.    Baseline 03/17/2021= Min assist with sit to stand and SPT    Time 6    Period Weeks    Status New               PT Long Term Goals - 03/17/21 1756       PT LONG TERM GOAL #1   Title Patient/caregiver will report indepence with final HEP with caregiver assistance.    Baseline 03/17/2021= No formal HEP in place    Time 12    Period Weeks    Status New    Target Date 06/09/21      PT LONG TERM GOAL #2   Title Pt will decrease 5TSTS by at least  15 seconds in order to demonstrate clinically significant improvement in LE strength.    Baseline 03/17/2021= 52.44 sec without UE support    Time 12    Period Weeks    Status New       PT LONG TERM GOAL #3   Title Pt will decrease TUG to below 34 seconds/decrease in order to demonstrate decreased fall risk.    Baseline 03/17/2021= 43.49 sec using RW    Time 12    Period Weeks    Status New    Target Date 06/09/21      PT LONG TERM GOAL #4   Title Patient will demo ability to ambulate > 400 ft w/ LRAD versus hand held assist min assist to demonstrate improved household mobility and short community distances.    Baseline 03/17/2021= 75 feet using RW with CGA    Time 12    Period Weeks    Status New    Target Date 06/09/21      PT LONG TERM GOAL #5   Title Pt will improve FOTO to target score of 52 to display perceived improvements in ability to complete ADL's.    Baseline 03/17/2021=45    Time 12    Period Weeks    Status New    Target Date 06/09/21      Additional Long Term Goals   Additional Long Term Goals Yes      PT LONG TERM GOAL #6   Title Pt will increase by at least 0.15 m/s in order to demonstrate clinically significant improvement in community ambulation.    Baseline 03/17/2021= 0.31 m/s using RW    Time 12    Period Weeks    Status New    Target Date 06/09/21                    Plan - 03/17/21 1730     Clinical Impression Statement Pt is a pleasant  36 year-old  female referred with diagnosis of Anoxic brain injury. She was receiving outpatient PT services in 2022 yet limited by visits and presents today to continue in the new calendar year. PT examination reveals deficits including impaired functional strength and coordination requiring assist with mobility and decreased balance placing her at risk for falling. She will benefit from skilled PT services to address these deficits, improve balance, and decrease risk for future falls.    Personal Factors and Comorbidities Behavior Pattern;Transportation;Comorbidity 3+    Comorbidities HTN, CAD, HLD    Examination-Activity Limitations Bed Mobility;Dressing;Hygiene/Grooming;Caring for  Others;Stairs;Stand;Toileting;Transfers;Sit;Bathing;Continence;Lift;Squat;Reach Overhead    Examination-Participation Restrictions Occupation;Medication Management;Cleaning;Yard Work;Personal Finances;Driving    Stability/Clinical Decision Making Evolving/Moderate complexity    Rehab Potential Good    PT Frequency 1x / week    PT Duration 12 weeks    PT Treatment/Interventions ADLs/Self Care Home Management;Aquatic Therapy;Electrical Stimulation;DME Instruction;Gait training;Stair training;Functional mobility training;Therapeutic activities;Therapeutic exercise;Balance training;Neuromuscular re-education;Wheelchair mobility training;Patient/family education;Orthotic Fit/Training;Manual techniques;Passive range of motion;Cryotherapy;Moist Heat    PT Next Visit Plan Initiate Neuromuscular re-ed for static and dynamic balance, BLE strengthening, coordination activities, Transfer and Gait training with least restrictive AD.    PT Home Exercise Plan Will initiate next visit    Consulted and Agree with Plan of Care Patient;Family member/caregiver    Family Member Consulted Mom             Patient will benefit from skilled therapeutic intervention in order to improve the following deficits and impairments:  Abnormal gait, Decreased balance, Decreased endurance, Decreased mobility, Difficulty walking, Impaired tone, Impaired sensation, Pain, Impaired UE functional use, Decreased strength, Decreased safety awareness, Decreased knowledge of use of DME, Decreased coordination, Decreased activity tolerance, Decreased cognition, Decreased range of motion, Increased muscle spasms  Visit Diagnosis: Other lack of coordination  Other abnormalities of gait and mobility  Muscle weakness (generalized)  Difficulty in walking, not elsewhere classified  Abnormality of gait and mobility  Unsteadiness on feet     Problem List Patient Active Problem List   Diagnosis Date Noted   S/P ICD (internal  cardiac defibrillator) procedure 11/27/2020   Hx of Cardiac arrest ([facility]) 11/26/2020   Abnormality of gait 03/16/2020   Sleep disturbance 03/16/2020   Oropharyngeal dysphagia 02/12/2020   Coronary artery disease involving native coronary artery of native heart without angina pectoris 12/12/2019   Ischemic cardiomyopathy 12/12/2019   Brain injury    Prediabetes    Anoxic brain injury ([facility]) 11/05/2019   Dysphagia 11/05/2019   Physical deconditioning 11/05/2019   Acute delirium 11/05/2019   Respiratory failure ([facility])    Acute metabolic encephalopathy    Encounter for central line placement    Ventricular fibrillation ([facility]) 09/23/2019   Acute combined systolic and diastolic heart failure ([facility]) 09/23/2019   Ventricular tachycardia, polymorphic 09/23/2019   Pelvic pain affecting pregnancy 10/15/2015   Supervision of normal pregnancy in third trimester 09/28/2015   Poor weight gain of pregnancy 09/28/2015   Iron deficiency anemia of pregnancy 09/01/2015   Increased BMI (body mass index) 07/29/2015    Lenda Kelp, PT 03/17/2021, 6:02 PM  [facility] MAIN [facility] SERVICES [address] [place], [place], [address] Phone: [phone]   Fax:  [phone]  Name: ZIRA HELINSKI MRN: [id number] Date of Birth: 05-Jul-1985

## 2021-03-23 ENCOUNTER — Encounter: Payer: Medicaid Other | Attending: Physical Medicine & Rehabilitation | Admitting: Physical Medicine & Rehabilitation

## 2021-03-23 ENCOUNTER — Encounter: Payer: Self-pay | Admitting: Physical Medicine & Rehabilitation

## 2021-03-23 ENCOUNTER — Other Ambulatory Visit: Payer: Self-pay

## 2021-03-23 VITALS — BP 101/73 | HR 80 | Ht 69.0 in

## 2021-03-23 DIAGNOSIS — R1312 Dysphagia, oropharyngeal phase: Secondary | ICD-10-CM | POA: Diagnosis present

## 2021-03-23 DIAGNOSIS — G931 Anoxic brain damage, not elsewhere classified: Secondary | ICD-10-CM | POA: Diagnosis not present

## 2021-03-23 MED ORDER — METHYLPHENIDATE HCL 5 MG PO TABS: 5.0000 mg | ORAL_TABLET | Freq: Two times a day (BID) | ORAL | 0 refills | Status: DC

## 2021-03-23 NOTE — Patient Instructions (Signed)
PLEASE FEEL FREE TO CALL OUR OFFICE WITH ANY PROBLEMS OR QUESTIONS ([phone])      

## 2021-03-23 NOTE — Progress Notes (Signed)
Subjective:    Patient ID: Beverly [name], female    DOB: August 10, 1985, 36 y.o.   MRN: [id number]  HPI  Beverly [name] is here in follow up of her ABI. The ritalin seems to have helped her initiation and focus.  She initiates more of her personal care and hygiene.  She seems more engaged in general.  She has resumed PT and speech therapy at [facility]. Mom has also bought an app through [employer] which has been good for cognition and speech  Her intake hasn't improved that much. She wants to eat but can't swallow her food.  Mom often will put food in her mouth and Beverly [name] indicates that she is hungry, however she has difficulty moving the food into her pharynx.  Mom has a friend to ask about an appetite stimulant and mother wanted to discuss that with me today.  Her mood seems generally upbeat.  She is more engaging with others.  Heart rate and blood pressure been generally under control.  Blood pressure has really not changed at all and her heart rates gotten into the 80s to low 90s on an occasion or 2.   Pain Inventory Average Pain 0 Pain Right Now 0 My pain is  no pain  LOCATION OF PAIN  no pain  BOWEL Number of stools per week: 6-7 Oral laxative use No  Enema or suppository use No  History of colostomy No   Incontinent Yes   BLADDER Pads Bladder incontinence Yes  Frequent urination No  Leakage with coughing No  Difficulty starting stream No  Incomplete bladder emptying No    Mobility walk with assistance use a walker how many minutes can you walk? 7-8 ability to climb steps?  yes do you drive?  no use a wheelchair needs help with transfers  Function disabled: date disabled 09/2019 I need assistance with the following:  feeding, dressing, bathing, toileting, meal prep, household duties, and shopping  Neuro/Psych bladder control problems trouble walking confusion  Prior Studies Any changes since last visit?  no  Physicians involved in your care Any changes since  last visit?  no   Family History  Problem Relation Age of Onset   Hyperlipidemia Mother    Hypertension Mother    Diabetes Maternal Grandmother    Seizures Maternal Grandmother    Thyroid disease Maternal Grandmother    Diabetes Paternal Grandmother    Rashes / Skin problems Son        ezcema   Seizures Son    Social History   Socioeconomic History   Marital status: Single    Spouse name: Not on file   Number of children: 3   Years of education: Not on file   Highest education level: Not on file  Occupational History   Occupation: oncology    Employer: [employer]    Comment: NA  Tobacco Use   Smoking status: Never   Smokeless tobacco: Never  Vaping Use   Vaping Use: Never used  Substance and Sexual Activity   Alcohol use: Not Currently    Alcohol/week: 0.0 standard drinks    Comment: quit 2017   Drug use: No    Frequency: 5.0 times per week    Types: Marijuana    Comment: stopped when she found out she was pregnant   Sexual activity: Not Currently    Partners: Male    Birth control/protection: None  Other Topics Concern   Not on file  Social History Narrative   01/06/20  lives with mom and her children   Social Determinants of Health   Financial Resource Strain: Not on file  Food Insecurity: Not on file  Transportation Needs: Not on file  Physical Activity: Not on file  Stress: Not on file  Social Connections: Not on file   Past Surgical History:  Procedure Laterality Date   CARDIAC CATHETERIZATION     IR GASTROSTOMY TUBE MOD SED  10/07/2019   IR GASTROSTOMY TUBE REMOVAL  07/02/2020   LEFT HEART CATH AND CORONARY ANGIOGRAPHY N/A 09/23/2019   Procedure: LEFT HEART CATH AND CORONARY ANGIOGRAPHY;  Surgeon: Nelva Bush, MD;  Location: [facility];  Service: Cardiovascular;  Laterality: N/A;   SUBQ ICD IMPLANT N/A 11/26/2020   Procedure: SUBQ ICD IMPLANT;  Surgeon: Vickie Epley, MD;  Location: [facility];  Service:  Cardiovascular;  Laterality: N/A;   Past Medical History:  Diagnosis Date   Brain injury    CAD (coronary artery disease)    HLD (hyperlipidemia)    Hypertension    last pregnancy   Ischemic cardiomyopathy    STEMI (ST elevation myocardial infarction) ([place]) 09/2019   SCAD with aneurysmal dilation of proximal LAD.   Vaginal Pap smear, abnormal    when she was 36yo   Ventricular fibrillation ([place]) 09/23/2019   BP 101/73    Pulse 80    Ht 5\' 9"  (1.753 m)    SpO2 97%    BMI 23.04 kg/m   Opioid Risk Score:   Fall Risk Score:  `1  Depression screen PHQ 2/9  Depression screen [facility] 2/9 03/23/2021 02/02/2021 09/08/2020 07/14/2020 04/15/2020 03/16/2020 02/12/2020  Decreased Interest 0 0 0 0 0 0 2  Down, Depressed, Hopeless 0 0 0 0 0 0 0  PHQ - 2 Score 0 0 0 0 0 0 2  Altered sleeping - - - 0 - - 2  Tired, decreased energy - - - - - - 1  Change in appetite - - - - - - 3  Feeling bad or failure about yourself  - - - - - - 0  Trouble concentrating - - - - - - 3  Moving slowly or fidgety/restless - - - - - - 3  Suicidal thoughts - - - - - - 0  PHQ-9 Score - - - 0 - - 14  Difficult doing work/chores - - - - - - Extremely dIfficult     Review of Systems  Constitutional: Negative.   HENT: Negative.    Eyes: Negative.   Respiratory: Negative.    Cardiovascular: Negative.   Gastrointestinal: Negative.   Endocrine: Negative.   Genitourinary:  Positive for urgency.  Musculoskeletal:  Positive for gait problem.  Skin: Negative.   Allergic/Immunologic: Negative.   Hematological: Negative.   Psychiatric/Behavioral:  Positive for confusion.       Objective:   Physical Exam  General: No acute distress HEENT: NCAT, EOMI, oral membranes moist Cards: reg rate  Chest: normal effort Abdomen: Soft, NT, ND Skin: dry, intact Extremities: no edema Psych: pleasant and more engaging  Skin: Warm and dry.  Intact.    Musc: No edema in extremities.  No tenderness in extremities. Neuro: alert to  person, nearly the date. MUCH more interactive today. Speech very dysarthric but answered appropriately with basic questions today.  Initiates movement in all fours but with some delay.  Significant oral apraxia ongoing.           Assessment & Plan:  1. Anoxic  BI - substantial attention, initiation deficits as well as memory deficits             MRI/CTs unremarkable             -Recent ICD placement.     Would start with a very low dose such as 2.5  . -Continue therapies at [facility].             -Consider Prevagen over-the-counter for memory -Ritalin seems to have made a difference with her attention and initiation.  It has not translated into swallowing just yet.  Consider increasing to 10 mg, but I would like to see how she does with her outpatient therapies first.  Also would like  to remain very conservative given her cardiovascular history. 2. Dysphagia:             -tMom continues pushing oral food but patient still struggling with oral motor apraxia and coordination.             Cont PEG as a back up for now             -Discussed with mom that this is not an appetite problem but really a pure swallowing issue.   3. Gait abnormality             Cont ambulation with assistance               RW for balance             PT has resumed per above 3. Sleep disturbance             Melatonin 3 mg daily --can take with trazodone            -trazodone at night time scheduled.  this has helped her sleep/wake quite a bit   15 minutes of face to face patient care time were spent during this visit. All questions were encouraged and answered. Follow up in 2 months

## 2021-03-24 ENCOUNTER — Ambulatory Visit: Payer: Medicaid Other

## 2021-03-24 DIAGNOSIS — M6281 Muscle weakness (generalized): Secondary | ICD-10-CM

## 2021-03-24 DIAGNOSIS — R41841 Cognitive communication deficit: Secondary | ICD-10-CM | POA: Diagnosis not present

## 2021-03-24 DIAGNOSIS — R2681 Unsteadiness on feet: Secondary | ICD-10-CM

## 2021-03-24 DIAGNOSIS — R262 Difficulty in walking, not elsewhere classified: Secondary | ICD-10-CM

## 2021-03-24 DIAGNOSIS — R269 Unspecified abnormalities of gait and mobility: Secondary | ICD-10-CM

## 2021-03-24 NOTE — Therapy (Signed)
[facility] [facility] MAIN [facility] SERVICES [address] [place], [place], [address] Phone: [phone]   Fax:  [phone]  Physical Therapy Treatment  Patient Details  Name: Beverly [name] MRN: [id number] Date of Birth: [birth year] Referring Provider (PT): Jerrilyn Cairo Date: 03/24/2021   PT End of Session - 03/24/21 1034     Visit Number 2    Number of Visits 12    Date for PT Re-Evaluation 06/09/21    Authorization Type Medicaid approval- 27 visits for OT/PT/SLP combined    Authorization Time Period Initial Certification  03/17/2021- 06/09/2021    Progress Note Due on Visit 10    PT Start Time 1146    PT Stop Time 1229    PT Time Calculation (min) 43 min    Equipment Utilized During Treatment Gait belt    Activity Tolerance Patient tolerated treatment well    Behavior During Therapy Impulsive             Past Medical History:  Diagnosis Date   Brain injury    CAD (coronary artery disease)    HLD (hyperlipidemia)    Hypertension    last pregnancy   Ischemic cardiomyopathy    STEMI (ST elevation myocardial infarction) ([facility]) 09/2019   SCAD with aneurysmal dilation of proximal LAD.   Vaginal Pap smear, abnormal    when she was 36yo   Ventricular fibrillation ([facility]) 09/23/2019    Past Surgical History:  Procedure Laterality Date   CARDIAC CATHETERIZATION     IR GASTROSTOMY TUBE MOD SED  10/07/2019   IR GASTROSTOMY TUBE REMOVAL  07/02/2020   LEFT HEART CATH AND CORONARY ANGIOGRAPHY N/A 09/23/2019   Procedure: LEFT HEART CATH AND CORONARY ANGIOGRAPHY;  Surgeon: Yvonne Kendall, MD;  Location: [facility] INVASIVE CV LAB;  Service: Cardiovascular;  Laterality: N/A;   SUBQ ICD IMPLANT N/A 11/26/2020   Procedure: SUBQ ICD IMPLANT;  Surgeon: Lanier Prude, MD;  Location: [facility] INVASIVE CV LAB;  Service: Cardiovascular;  Laterality: N/A;    There were no vitals filed for this visit.   INTERVENTIONS:   Therapeutic exercises:  at support  bar with use of gait belt and CGA unless otherwise stated  -Standing hip march alt LE- Max VC, Visual cues to perform correctly- Patient with max difficulty coordinating task but improved once PT stood beside her and performed exercise with her. Limited by fatigue- 2 sets of 10 reps.    -Standing hip abd - Patient impulsive and distracted requiring max VC to perform correctly 2 sets of 10 reps.  -Stand hip ext- 2 sets of 10 reps  Therapeutic Activities:   Sit to stand with VC for hand/feet placement and to scoot forward. Max VC to push off from armrest. X 10 reps. Increased time to instruct as patient struggled with concept of pushing up from armrest      Access Code: [email or web address] URL: [email or web address] Date: 03/24/2021 Prepared by: Maureen Ralphs  Exercises Standing Hip Abduction with Counter Support - 1 x daily - 7 x weekly - 3 sets - 10 reps Standing Hip Extension with Counter Support - 1 x daily - 7 x weekly - 3 sets - 10 reps Proper Sit to Stand Technique - 1 x daily - 7 x weekly - 3 sets - 10 reps    Education provided throughout session via VC/TC and demonstration to facilitate movement at target joints and correct muscle activation for all testing and exercises performed.  PT Short Term Goals - 03/17/21 1754       PT SHORT TERM GOAL #1   Title Patient will perform all transfers with Stand by assist using least restrictive AD,  BUE Support, and reaching back for safety to improve her functional independence and decrease fall risk.    Baseline 03/17/2021= Min assist with sit to stand and SPT    Time 6    Period Weeks    Status New               PT Long Term Goals - 03/17/21 1756       PT LONG TERM GOAL #1   Title Patient/caregiver will report indepence with final HEP with caregiver assistance.    Baseline 03/17/2021= No formal HEP in place    Time 12    Period Weeks    Status New    Target Date 06/09/21       PT LONG TERM GOAL #2   Title Pt will decrease 5TSTS by at least  15 seconds in order to demonstrate clinically significant improvement in LE strength.    Baseline 03/17/2021= 52.44 sec without UE support    Time 12    Period Weeks    Status New      PT LONG TERM GOAL #3   Title Pt will decrease TUG to below 34 seconds/decrease in order to demonstrate decreased fall risk.    Baseline 03/17/2021= 43.49 sec using RW    Time 12    Period Weeks    Status New    Target Date 06/09/21      PT LONG TERM GOAL #4   Title Patient will demo ability to ambulate > 400 ft w/ LRAD versus hand held assist min assist to demonstrate improved household mobility and short community distances.    Baseline 03/17/2021= 75 feet using RW with CGA    Time 12    Period Weeks    Status New    Target Date 06/09/21      PT LONG TERM GOAL #5   Title Pt will improve FOTO to target score of 52 to display perceived improvements in ability to complete ADL's.    Baseline 03/17/2021=45    Time 12    Period Weeks    Status New    Target Date 06/09/21      Additional Long Term Goals   Additional Long Term Goals Yes      PT LONG TERM GOAL #6   Title Pt will increase by at least 0.15 m/s in order to demonstrate clinically significant improvement in community ambulation.    Baseline 03/17/2021= 0.31 m/s using RW    Time 12    Period Weeks    Status New    Target Date 06/09/21                   Plan - 03/24/21 1024     Clinical Impression Statement Patient presents with fair motivation - easily distracted and mother constantly asking her to "use your words." Patient was able to perform several LE strengthening exercises with max Verbal and visual cues to complete today. She was impulsive at times and difficutly following cues consistently for safe transfers today. She will benefit from continued skilled PT services to improve her overall strength, balance and safety with mobility to decrease fall risk and  improve quality of life.    Personal Factors and Comorbidities Behavior Pattern;Transportation;Comorbidity 3+    Comorbidities HTN, CAD,  HLD    Examination-Activity Limitations Bed Mobility;Dressing;Hygiene/Grooming;Caring for Others;Stairs;Stand;Toileting;Transfers;Sit;Bathing;Continence;Lift;Squat;Reach Overhead    Examination-Participation Restrictions Occupation;Medication Management;Cleaning;Yard Work;Personal Finances;Driving    Stability/Clinical Decision Making Evolving/Moderate complexity    Rehab Potential Good    PT Frequency 1x / week    PT Duration 12 weeks    PT Treatment/Interventions ADLs/Self Care Home Management;Aquatic Therapy;Electrical Stimulation;DME Instruction;Gait training;Stair training;Functional mobility training;Therapeutic activities;Therapeutic exercise;Balance training;Neuromuscular re-education;Wheelchair mobility training;Patient/family education;Orthotic Fit/Training;Manual techniques;Passive range of motion;Cryotherapy;Moist Heat    PT Next Visit Plan Progress  Neuromuscular re-ed for static and dynamic balance, BLE strengthening, coordination activities, Transfer and Gait training with least restrictive AD.    PT Home Exercise Plan Access Code: [email or web address]    Consulted and Agree with Plan of Care Patient;Family member/caregiver    Family Member Consulted Mom             Patient will benefit from skilled therapeutic intervention in order to improve the following deficits and impairments:  Abnormal gait, Decreased balance, Decreased endurance, Decreased mobility, Difficulty walking, Impaired tone, Impaired sensation, Pain, Impaired UE functional use, Decreased strength, Decreased safety awareness, Decreased knowledge of use of DME, Decreased coordination, Decreased activity tolerance, Decreased cognition, Decreased range of motion, Increased muscle spasms  Visit Diagnosis: Abnormality of gait and mobility  Difficulty in walking, not elsewhere  classified  Muscle weakness (generalized)  Unsteadiness on feet     Problem List Patient Active Problem List   Diagnosis Date Noted   S/P ICD (internal cardiac defibrillator) procedure 11/27/2020   Hx of Cardiac arrest (HCC) 11/26/2020   Abnormality of gait 03/16/2020   Sleep disturbance 03/16/2020   Oropharyngeal dysphagia 02/12/2020   Coronary artery disease involving native coronary artery of native heart without angina pectoris 12/12/2019   Ischemic cardiomyopathy 12/12/2019   Brain injury    Prediabetes    Anoxic brain injury (HCC) 11/05/2019   Dysphagia 11/05/2019   Physical deconditioning 11/05/2019   Acute delirium 11/05/2019   Respiratory failure (HCC)    Acute metabolic encephalopathy    Encounter for central line placement    Ventricular fibrillation (HCC) 09/23/2019   Acute combined systolic and diastolic heart failure (HCC) 09/23/2019   Ventricular tachycardia, polymorphic 09/23/2019   Pelvic pain affecting pregnancy 10/15/2015   Supervision of normal pregnancy in third trimester 09/28/2015   Poor weight gain of pregnancy 09/28/2015   Iron deficiency anemia of pregnancy 09/01/2015   Increased BMI (body mass index) 07/29/2015    Lenda Kelp, PT 03/25/2021, 10:58 AM  [place] [facility] MAIN [facility] SERVICES [address] [place], [place], [address] Phone: [phone]   Fax:  [phone]  Name: Beverly [name] MRN: [id number] Date of Birth: 1985/12/03

## 2021-03-28 ENCOUNTER — Other Ambulatory Visit: Payer: Self-pay | Admitting: Physician Assistant

## 2021-03-29 NOTE — Telephone Encounter (Signed)
This is a [facility] pt 

## 2021-03-31 ENCOUNTER — Ambulatory Visit: Payer: Medicaid Other

## 2021-03-31 ENCOUNTER — Ambulatory Visit: Payer: Medicaid Other | Admitting: Speech Pathology

## 2021-04-07 ENCOUNTER — Encounter: Payer: Self-pay | Admitting: Physical Therapy

## 2021-04-07 ENCOUNTER — Encounter: Payer: Medicaid Other | Admitting: Speech Pathology

## 2021-04-07 ENCOUNTER — Ambulatory Visit: Payer: Medicaid Other | Attending: Physician Assistant | Admitting: Physical Therapy

## 2021-04-07 ENCOUNTER — Other Ambulatory Visit: Payer: Self-pay

## 2021-04-07 DIAGNOSIS — R269 Unspecified abnormalities of gait and mobility: Secondary | ICD-10-CM | POA: Diagnosis not present

## 2021-04-07 DIAGNOSIS — R41841 Cognitive communication deficit: Secondary | ICD-10-CM | POA: Insufficient documentation

## 2021-04-07 DIAGNOSIS — M6281 Muscle weakness (generalized): Secondary | ICD-10-CM

## 2021-04-07 DIAGNOSIS — R262 Difficulty in walking, not elsewhere classified: Secondary | ICD-10-CM

## 2021-04-07 DIAGNOSIS — G931 Anoxic brain damage, not elsewhere classified: Secondary | ICD-10-CM | POA: Diagnosis present

## 2021-04-07 NOTE — Therapy (Signed)
[place] MAIN [facility] SERVICES [address] [place], [place], [address] Phone: [phone]   Fax:  [phone]  Physical Therapy Treatment  Patient Details  Name: Beverly [name] MRN: [id number] Date of Birth: 1985-05-11 Referring Provider (PT): Jerrilyn Cairo Date: 04/07/2021   PT End of Session - 04/07/21 1557     Visit Number 3    Number of Visits 12    Date for PT Re-Evaluation 06/09/21    Authorization Type Medicaid approval- 27 visits for OT/PT/SLP combined    Authorization Time Period Initial Certification  03/17/2021- 06/09/2021    Progress Note Due on Visit 10    PT Start Time 1600    PT Stop Time 1640    PT Time Calculation (min) 40 min    Equipment Utilized During Treatment Gait belt    Activity Tolerance Patient tolerated treatment well    Behavior During Therapy Impulsive             Past Medical History:  Diagnosis Date   Brain injury    CAD (coronary artery disease)    HLD (hyperlipidemia)    Hypertension    last pregnancy   Ischemic cardiomyopathy    STEMI (ST elevation myocardial infarction) (HCC) 09/2019   SCAD with aneurysmal dilation of proximal LAD.   Vaginal Pap smear, abnormal    when she was 36yo   Ventricular fibrillation (HCC) 09/23/2019    Past Surgical History:  Procedure Laterality Date   CARDIAC CATHETERIZATION     IR GASTROSTOMY TUBE MOD SED  10/07/2019   IR GASTROSTOMY TUBE REMOVAL  07/02/2020   LEFT HEART CATH AND CORONARY ANGIOGRAPHY N/A 09/23/2019   Procedure: LEFT HEART CATH AND CORONARY ANGIOGRAPHY;  Surgeon: Yvonne Kendall, MD;  Location: [facility] INVASIVE CV LAB;  Service: Cardiovascular;  Laterality: N/A;   SUBQ ICD IMPLANT N/A 11/26/2020   Procedure: SUBQ ICD IMPLANT;  Surgeon: Lanier Prude, MD;  Location: [facility] INVASIVE CV LAB;  Service: Cardiovascular;  Laterality: N/A;    There were no vitals filed for this visit.   Subjective Assessment - 04/07/21 1556     Subjective  Patient presents with her father today. Father reports his mother normally stays with her but there were no significant changes he is aware of.    Patient is accompained by: Family member   Dad   Pertinent History Patient is a 36 year old with diagnois of anoxic brain injury. She was receiving outpatient PT until Feb. 2022 last year but ran out of visits. Patient has past medical history of Anoxic Brain Injury 09/2019, CAD, HLD, HTN, ISCHEMIC CARDIMYOPATHY, STEMI, V-FIB, ICD on 11/29/2020  .On 09/23/19 patient presented to ED 2 weeks postpartum (had emergent C-Section) with chest pain. In ED sustained VFib Cardiac Arrest requiring intubation. EKG showed ST elevation, Cath Lab showed aneurysmal dissection of the LAD was identified. Course complicated by encephalopathy, anoxic brain injury, enterobacterial PNA. Patient is currently living with mom. Mother reports she is abel to walk some with a walker with supervision but primarily ambulates with HHA of mother who is her guardian. Mother also reports she requires assist for all transfers and ADL's.    Limitations Sitting;Standing;Walking;House hold activities    How long can you sit comfortably? no issues    Patient Stated Goals walk better             NTERVENTIONS:   Therapeutic exercises:  at support bar with use of gait belt and CGA unless  otherwise stated -pt requires constant cues icnluding verbal and visual cues for attention to task with all activities in today's session. Pt easily distracted by clinic environment although clinic not busy at the time.     -Standing hip abd - Patient impulsive and distracted requiring max VC to perform correctly 2 sets of 10 reps.  -Stand hip ext- 2 sets of 10 reps  Seated LAQ 3# AW 2 x 5 reps with 3 sec holds and PT hands for external cues for movement and maintenance of contraction.   Seated marching with 3# AW, many reps alternating - see above for cues.   Therapeutic Activities:   Sit to stand with  VC for hand placement and to scoot forward. Max VC to push off from armrest. 2* 5 reps  Increased time to instruct as patient struggled with concept of pushing up from armrest consistently and struggles with attention to task     ambulation with 2WW 2 x 75 feet with max cues for transitions and with frequent cues for attention to task  -good balance, cues for walker management throughout particularly with turns.      Pt educated throughout session about proper posture and technique with exercises. Improved exercise technique, movement at target joints, use of target muscles after min to mod verbal, visual, tactile cues. Note: Portions of this document were prepared using Dragon voice recognition software and although reviewed may contain unintentional dictation errors in syntax, grammar, or spelling.                             PT Education - 04/08/21 0800     Education Details discussed more hiome exercises and benefits of regular ambulation    Person(s) Educated Parent(s)    Methods Explanation    Comprehension Verbalized understanding              PT Short Term Goals - 03/17/21 1754       PT SHORT TERM GOAL #1   Title Patient will perform all transfers with Stand by assist using least restrictive AD,  BUE Support, and reaching back for safety to improve her functional independence and decrease fall risk.    Baseline 03/17/2021= Min assist with sit to stand and SPT    Time 6    Period Weeks    Status New               PT Long Term Goals - 03/17/21 1756       PT LONG TERM GOAL #1   Title Patient/caregiver will report indepence with final HEP with caregiver assistance.    Baseline 03/17/2021= No formal HEP in place    Time 12    Period Weeks    Status New    Target Date 06/09/21      PT LONG TERM GOAL #2   Title Pt will decrease 5TSTS by at least  15 seconds in order to demonstrate clinically significant improvement in LE strength.     Baseline 03/17/2021= 52.44 sec without UE support    Time 12    Period Weeks    Status New      PT LONG TERM GOAL #3   Title Pt will decrease TUG to below 34 seconds/decrease in order to demonstrate decreased fall risk.    Baseline 03/17/2021= 43.49 sec using RW    Time 12    Period Weeks    Status New    Target  Date 06/09/21      PT LONG TERM GOAL #4   Title Patient will demo ability to ambulate > 400 ft w/ LRAD versus hand held assist min assist to demonstrate improved household mobility and short community distances.    Baseline 03/17/2021= 75 feet using RW with CGA    Time 12    Period Weeks    Status New    Target Date 06/09/21      PT LONG TERM GOAL #5   Title Pt will improve FOTO to target score of 52 to display perceived improvements in ability to complete ADL's.    Baseline 03/17/2021=45    Time 12    Period Weeks    Status New    Target Date 06/09/21      Additional Long Term Goals   Additional Long Term Goals Yes      PT LONG TERM GOAL #6   Title Pt will increase 10MWT by at least 0.15 m/s in order to demonstrate clinically significant improvement in community ambulation.    Baseline 03/17/2021= 0.31 m/s using RW    Time 12    Period Weeks    Status New    Target Date 06/09/21                   Plan - 04/07/21 1557     Clinical Impression Statement Pt presents to physical therapy with father this date. Pt required consistent multimodal cueing for proper comlpletion of tasks as well as for atention to task specific activities. Pt did not have good carry over for sit to stand with safe use of hands throughout session but with practice pt did show improved sequencing with stand to sit transfers. With sit to stand activity pt had trembiling in her LEs with increased repetitions likely due to fatigue. Pt will continue to benefit from skilled PT interventions in order toimprove balance, transfers, mobility, strength and QOL.    Personal Factors and Comorbidities  Behavior Pattern;Transportation;Comorbidity 3+    Comorbidities HTN, CAD, HLD    Examination-Activity Limitations Bed Mobility;Dressing;Hygiene/Grooming;Caring for Others;Stairs;Stand;Toileting;Transfers;Sit;Bathing;Continence;Lift;Squat;Reach Overhead    Examination-Participation Restrictions Occupation;Medication Management;Cleaning;Yard Work;Personal Finances;Driving    Stability/Clinical Decision Making Evolving/Moderate complexity    Rehab Potential Good    PT Frequency 1x / week    PT Duration 12 weeks    PT Treatment/Interventions ADLs/Self Care Home Management;Aquatic Therapy;Electrical Stimulation;DME Instruction;Gait training;Stair training;Functional mobility training;Therapeutic activities;Therapeutic exercise;Balance training;Neuromuscular re-education;Wheelchair mobility training;Patient/family education;Orthotic Fit/Training;Manual techniques;Passive range of motion;Cryotherapy;Moist Heat    PT Next Visit Plan Progress  Neuromuscular re-ed for static and dynamic balance, BLE strengthening, coordination activities, Transfer and Gait training with least restrictive AD.    PT Home Exercise Plan Access Code: [email or web address]    Consulted and Agree with Plan of Care Patient;Family member/caregiver    Family Member Consulted Mom             Patient will benefit from skilled therapeutic intervention in order to improve the following deficits and impairments:  Abnormal gait, Decreased balance, Decreased endurance, Decreased mobility, Difficulty walking, Impaired tone, Impaired sensation, Pain, Impaired UE functional use, Decreased strength, Decreased safety awareness, Decreased knowledge of use of DME, Decreased coordination, Decreased activity tolerance, Decreased cognition, Decreased range of motion, Increased muscle spasms  Visit Diagnosis: Abnormality of gait and mobility  Difficulty in walking, not elsewhere classified  Muscle weakness (generalized)     Problem List Patient  Active Problem List   Diagnosis Date Noted   S/P ICD (internal cardiac defibrillator) procedure  11/27/2020   Hx of Cardiac arrest ([place]) 11/26/2020   Abnormality of gait 03/16/2020   Sleep disturbance 03/16/2020   Oropharyngeal dysphagia 02/12/2020   Coronary artery disease involving native coronary artery of native heart without angina pectoris 12/12/2019   Ischemic cardiomyopathy 12/12/2019   Brain injury    Prediabetes    Anoxic brain injury ([place]) 11/05/2019   Dysphagia 11/05/2019   Physical deconditioning 11/05/2019   Acute delirium 11/05/2019   Respiratory failure ([place])    Acute metabolic encephalopathy    Encounter for central line placement    Ventricular fibrillation ([place]) 09/23/2019   Acute combined systolic and diastolic heart failure ([place]) 09/23/2019   Ventricular tachycardia, polymorphic 09/23/2019   Pelvic pain affecting pregnancy 10/15/2015   Supervision of normal pregnancy in third trimester 09/28/2015   Poor weight gain of pregnancy 09/28/2015   Iron deficiency anemia of pregnancy 09/01/2015   Increased BMI (body mass index) 07/29/2015    Particia Lather, PT 04/08/2021, 8:08 AM  [place] MAIN [facility] SERVICES [address] [place], [place], [address] Phone: [phone]   Fax:  [phone]  Name: GINELLE HAMAR MRN: [id number] Date of Birth: December 12, 1985

## 2021-04-08 ENCOUNTER — Encounter: Payer: Self-pay | Admitting: Physical Therapy

## 2021-04-14 ENCOUNTER — Ambulatory Visit: Payer: Medicaid Other | Admitting: Speech Pathology

## 2021-04-14 ENCOUNTER — Other Ambulatory Visit: Payer: Self-pay

## 2021-04-14 DIAGNOSIS — R41841 Cognitive communication deficit: Secondary | ICD-10-CM

## 2021-04-14 DIAGNOSIS — G931 Anoxic brain damage, not elsewhere classified: Secondary | ICD-10-CM

## 2021-04-14 DIAGNOSIS — R269 Unspecified abnormalities of gait and mobility: Secondary | ICD-10-CM | POA: Diagnosis not present

## 2021-04-16 NOTE — Therapy (Signed)
[place] ?[facility] MAIN REHAB SERVICES ?[address] ?[place], [place], [address] ?Phone: [phone]   Fax:  [phone] ? ?Speech Language Pathology Treatment ? ?Patient Details  ?Name: Beverly BrunsShatoya A Elsberry ?MRN: [id number] ?Date of Birth: 08/30/1985 ?Referring Provider (SLP): Ronni RumbleElizabeth Morre Rasmussen ? ? ?Encounter Date: 04/14/2021 ? ? End of Session - 04/16/21 2207   ? ? Visit Number 2   ? Number of Visits 25   ? Date for SLP Re-Evaluation 09/01/21   ? Authorization Type Medicaid [place] Access   ? Authorization Time Period 03/10/2021 thru 09/01/2021   ? Authorization - Visit Number 2   ? Progress Note Due on Visit 10   ? SLP Start Time 1500   ? SLP Stop Time  1600   ? SLP Time Calculation (min) 60 min   ? Activity Tolerance Patient tolerated treatment well   ? ?  ?  ? ?  ? ? ?Past Medical History:  ?Diagnosis Date  ? Brain injury   ? CAD (coronary artery disease)   ? HLD (hyperlipidemia)   ? Hypertension   ? last pregnancy  ? Ischemic cardiomyopathy   ? STEMI (ST elevation myocardial infarction) (HCC) 09/2019  ? SCAD with aneurysmal dilation of proximal LAD.  ? Vaginal Pap smear, abnormal   ? when she was 36yo  ? Ventricular fibrillation (HCC) 09/23/2019  ? ? ?Past Surgical History:  ?Procedure Laterality Date  ? CARDIAC CATHETERIZATION    ? IR GASTROSTOMY TUBE MOD SED  10/07/2019  ? IR GASTROSTOMY TUBE REMOVAL  07/02/2020  ? LEFT HEART CATH AND CORONARY ANGIOGRAPHY N/A 09/23/2019  ? Procedure: LEFT HEART CATH AND CORONARY ANGIOGRAPHY;  Surgeon: Yvonne KendallEnd, Christopher, MD;  Location: [facility] INVASIVE CV LAB;  Service: Cardiovascular;  Laterality: N/A;  ? SUBQ ICD IMPLANT N/A 11/26/2020  ? Procedure: SUBQ ICD IMPLANT;  Surgeon: Lanier PrudeLambert, Cameron T, MD;  [facility];  Service: Cardiovascular;  Laterality: N/A;  ? ? ?There were no vitals filed for this visit. ? ? Subjective Assessment - 04/16/21 2202   ? ? Subjective pt pleasant, few words, accompanied by her mother   ? Patient is  accompained by: Family member   ? Currently in Pain? No/denies   ? ?  ?  ? ?  ? ? ? ? ? ? ? ? ADULT SLP TREATMENT - 04/16/21 0001   ? ?  ? Treatment Provided  ? Treatment provided Cognitive-Linquistic   ?  ? Cognitive-Linquistic Treatment  ? Treatment focused on Cognition;Patient/family/caregiver education   ? Skilled Treatment Skilled treatment session focused on pt's cognitive communication deficits, specifically using an SGD to aid in recall of her children's names, number of children that she has. Photos of pt's children were loaded on clinic device with pt able to point to [place], [place] but unable to point to Zuri as being one of her children. SLP further facilitated session by providing general conversational questions about funcitonal information with pt grunting in response to all questions. With maximal continued cues form pt's mother, pt replied "yes" x 4 times over the course of the session. In addition to holding salvia in her mouth, pt has new behavior of puffing cheeks and holding them while at rest. This appears to inhibit pt's use of words.   ? ?  ?  ? ?  ? ? ? SLP Education - 04/16/21 2207   ? ? Education Details provied   ? Person(s) Educated Patient;Parent(s)   ? Methods Explanation;Demonstration;Verbal cues;Tactile  cues   ? Comprehension Verbalized understanding;Need further instruction   ? ?  ?  ? ?  ? ? ? SLP Short Term Goals - 03/10/21 1621   ? ?  ? SLP SHORT TERM GOAL #1  ? Title Pt will assist in selecting icons and information to customize SGD.   ? Baseline new goal, able to select icons on trial screen   ? Time 10   ? Period Weeks   ? Status New   ?  ? SLP SHORT TERM GOAL #2  ? Title Pt will use device to state the names of her children and their ages in 7 out of 10 opportunities.   ? Baseline inaccurate recall of their names and ages   ? Time 10   ? Period --   sessions  ? Status New   ?  ? SLP SHORT TERM GOAL #3  ? Title Pt will use SGD to communicate basic need at home as reported by pt's  mother.   ? Baseline Pt is reliant on her mother, pt doesn't initiate any communication about wants/needs   ? Time 10   ? Period --   session  ? Status New   ? ?  ?  ? ?  ? ? ? SLP Long Term Goals - 03/10/21 1623   ? ?  ? SLP LONG TERM GOAL #1  ? Title Pt will use multimodal communication to accurately answer basic questions about her children.   ? Baseline unable to recall 3 children, their ages and their names are untelligible to unfamiliar listener   ? Time 12   ? Period Weeks   ? Status New   ? Target Date 06/02/21   ? ?  ?  ? ?  ? ? ? Plan - 04/16/21 2208   ? ? Clinical Impression Statement Pt presents with few words, severely delayed processing and response times. As such pt demonstrates very profound inability to particpate in functional activities of daily living. Continue to recommend skilled ST intervention to target these deficits to improve some functional independence and reduce caregiver burden.   ? Speech Therapy Frequency 1x /week   ? Duration --   25  ? Treatment/Interventions SLP instruction and feedback;Functional tasks;Multimodal communcation approach;Patient/family education   ? Potential to Achieve Goals Good   ? Potential Considerations Ability to learn/carryover information;Co-morbidities;Financial resources;Severity of impairments;Cooperation/participation level   ? Consulted and Agree with Plan of Care Patient;Family member/caregiver   ? Family Member Consulted pt's mother   ? ?  ?  ? ?  ? ? ?Patient will benefit from skilled therapeutic intervention in order to improve the following deficits and impairments:   ?Cognitive communication deficit ? ?Anoxic brain injury (HCC) ? ? ? ?Problem List ?Patient Active Problem List  ? Diagnosis Date Noted  ? S/P ICD (internal cardiac defibrillator) procedure 11/27/2020  ? Hx of Cardiac arrest (HCC) 11/26/2020  ? Abnormality of gait 03/16/2020  ? Sleep disturbance 03/16/2020  ? Oropharyngeal dysphagia 02/12/2020  ? Coronary artery disease involving  native coronary artery of native heart without angina pectoris 12/12/2019  ? Ischemic cardiomyopathy 12/12/2019  ? Brain injury   ? Prediabetes   ? Anoxic brain injury (HCC) 11/05/2019  ? Dysphagia 11/05/2019  ? Physical deconditioning 11/05/2019  ? Acute delirium 11/05/2019  ? Respiratory failure (HCC)   ? Acute metabolic encephalopathy   ? Encounter for central line placement   ? Ventricular fibrillation (HCC) 09/23/2019  ? Acute combined systolic and diastolic  heart failure (HCC) 09/23/2019  ? Ventricular tachycardia, polymorphic 09/23/2019  ? Pelvic pain affecting pregnancy 10/15/2015  ? Supervision of normal pregnancy in third trimester 09/28/2015  ? Poor weight gain of pregnancy 09/28/2015  ? Iron deficiency anemia of pregnancy 09/01/2015  ? Increased BMI (body mass index) 07/29/2015  ? ?Galo Sayed B. Dreama Saa, M.S., CCC-SLP, CBIS ?Speech-Language Pathologist ?Rehabilitation Services ?Office [phone] ? ?Shaydon Lease, CCC-SLP ?04/16/2021, 10:11 PM ? ?[place] ?[facility] MAIN REHAB SERVICES ?[address] ?[place], [place], [address] ?Phone: [phone]   Fax:  [phone] ? ? ?Name: Beverly [name] ?MRN: [id number] ?Date of Birth: 1985/08/08 ? ?

## 2021-04-21 ENCOUNTER — Ambulatory Visit: Payer: Medicaid Other | Admitting: Speech Pathology

## 2021-04-22 ENCOUNTER — Other Ambulatory Visit: Payer: Self-pay | Admitting: Physical Medicine & Rehabilitation

## 2021-04-22 DIAGNOSIS — G479 Sleep disorder, unspecified: Secondary | ICD-10-CM

## 2021-04-28 ENCOUNTER — Other Ambulatory Visit: Payer: Self-pay

## 2021-04-28 ENCOUNTER — Ambulatory Visit: Payer: Medicaid Other | Admitting: Speech Pathology

## 2021-04-28 DIAGNOSIS — G931 Anoxic brain damage, not elsewhere classified: Secondary | ICD-10-CM

## 2021-04-28 DIAGNOSIS — R41841 Cognitive communication deficit: Secondary | ICD-10-CM

## 2021-04-28 DIAGNOSIS — R269 Unspecified abnormalities of gait and mobility: Secondary | ICD-10-CM | POA: Diagnosis not present

## 2021-04-29 NOTE — Therapy (Signed)
[place] ?[facility] ?[place], [place], [address] ?Phone: [phone]   Fax:  [phone] ? ?Speech Language Pathology Treatment ? ?Patient Details  ?Name: Beverly [name] ?MRN: [id number] ?Date of Birth: [birth year] ?Referring Provider (SLP): Stephania Fragmin ? ? ?Encounter Date: 04/28/2021 ? ? End of Session - 04/29/21 1418   ? ? Visit Number 3   ? Number of Visits 25   ? Date for SLP Re-Evaluation 09/01/21   ? Authorization Type Medicaid [place] Access   ? Authorization Time Period 03/10/2021 thru 09/01/2021   ? Authorization - Visit Number 2   ? Authorization - Number of Visits 12   ? Progress Note Due on Visit 10   ? SLP Start Time 1400   ? SLP Stop Time  1500   ? SLP Time Calculation (min) 60 min   ? Activity Tolerance Patient tolerated treatment well   ? ?  ?  ? ?  ? ? ?Past Medical History:  ?Diagnosis Date  ? Brain injury   ? CAD (coronary artery disease)   ? HLD (hyperlipidemia)   ? Hypertension   ? last pregnancy  ? Ischemic cardiomyopathy   ? STEMI (ST elevation myocardial infarction) ([place]) 09/2019  ? SCAD with aneurysmal dilation of proximal LAD.  ? Vaginal Pap smear, abnormal   ? when she was 36yo  ? Ventricular fibrillation ([place]) 09/23/2019  ? ? ?Past Surgical History:  ?Procedure Laterality Date  ? CARDIAC CATHETERIZATION    ? IR GASTROSTOMY TUBE MOD SED  10/07/2019  ? IR GASTROSTOMY TUBE REMOVAL  07/02/2020  ? LEFT HEART CATH AND CORONARY ANGIOGRAPHY N/A 09/23/2019  ? Procedure: LEFT HEART CATH AND CORONARY ANGIOGRAPHY;  Surgeon: Nelva Bush, MD;  Location: [facility];  Service: Cardiovascular;  Laterality: N/A;  ? SUBQ ICD IMPLANT N/A 11/26/2020  ? Procedure: SUBQ ICD IMPLANT;  Surgeon: Vickie Epley, MD;  Location: [facility];  Service: Cardiovascular;  Laterality: N/A;  ? ? ?There were no vitals filed for this visit. ? ? Subjective Assessment - 04/29/21 1413   ? ? Subjective pt was accompanied by her mother  but attended session alone to promote independence   ? Currently in Pain? No/denies   ? ?  ?  ? ?  ? ? ? ? ? ? ? ? ADULT SLP TREATMENT - 04/29/21 0001   ? ?  ? Treatment Provided  ? Treatment provided Cognitive-Linquistic   ?  ? Cognitive-Linquistic Treatment  ? Treatment focused on Cognition;Patient/family/caregiver education   ? Skilled Treatment Skilled treatment session focused on pt's cognitive communication deficits. SLP engaged pt in a novel game of WAR. Pt demonstrated great comprehension of game, attention to game, increased verbalization as well as 100% accuracy when identifying which card was higher.   ? ?  ?  ? ?  ? ? ? SLP Education - 04/29/21 1417   ? ? Education Details provided on session   ? Person(s) Educated Patient;Parent(s)   ? Methods Explanation;Demonstration;Verbal cues   ? Comprehension Verbalized understanding;Need further instruction   ? ?  ?  ? ?  ? ? ? SLP Short Term Goals - 03/10/21 1621   ? ?  ? SLP SHORT TERM GOAL #1  ? Title Pt will assist in selecting icons and information to customize SGD.   ? Baseline new goal, able to select icons on trial screen   ? Time 10   ? Period Weeks   ?  Status New   ?  ? SLP SHORT TERM GOAL #2  ? Title Pt will use device to state the names of her children and their ages in 15 out of 74 opportunities.   ? Baseline inaccurate recall of their names and ages   ? Time 10   ? Period --   sessions  ? Status New   ?  ? SLP SHORT TERM GOAL #3  ? Title Pt will use SGD to communicate basic need at home as reported by pt's mother.   ? Baseline Pt is reliant on her mother, pt doesn't initiate any communication about wants/needs   ? Time 10   ? Period --   session  ? Status New   ? ?  ?  ? ?  ? ? ? SLP Long Term Goals - 03/10/21 1623   ? ?  ? SLP LONG TERM GOAL #1  ? Title Pt will use multimodal communication to accurately answer basic questions about her children.   ? Baseline unable to recall 3 children, their ages and their names are untelligible to unfamiliar  listener   ? Time 12   ? Period Weeks   ? Status New   ? Target Date 06/02/21   ? ?  ?  ? ?  ? ? ? Plan - 04/29/21 1418   ? ? Clinical Impression Statement During today's session, pt appeared less cognitive impaired that in previous session. Question if more natural interaction around a game promoted improved ability rather than traditional structure activity. Will continue to assess and speak with pt's mother regarding realistic goals. Skilled ST intervention continues to be required to increase pt's functional cognitive communication.   ? Speech Therapy Frequency 1x /week   ? Duration --   25  ? Treatment/Interventions SLP instruction and feedback;Functional tasks;Multimodal communcation approach;Patient/family education   ? Potential to Achieve Goals Good   ? Potential Considerations Ability to learn/carryover information;Co-morbidities;Financial resources;Severity of impairments;Cooperation/participation level   ? Consulted and Agree with Plan of Care Patient;Family member/caregiver   ? Family Member Consulted pt's mother   ? ?  ?  ? ?  ? ? ?Patient will benefit from skilled therapeutic intervention in order to improve the following deficits and impairments:   ?Cognitive communication deficit ? ?Anoxic brain injury ([facility]) ? ? ? ?Problem List ?Patient Active Problem List  ? Diagnosis Date Noted  ? S/P ICD (internal cardiac defibrillator) procedure 11/27/2020  ? Hx of Cardiac arrest ([facility]) 11/26/2020  ? Abnormality of gait 03/16/2020  ? Sleep disturbance 03/16/2020  ? Oropharyngeal dysphagia 02/12/2020  ? Coronary artery disease involving native coronary artery of native heart without angina pectoris 12/12/2019  ? Ischemic cardiomyopathy 12/12/2019  ? Brain injury   ? Prediabetes   ? Anoxic brain injury ([facility]) 11/05/2019  ? Dysphagia 11/05/2019  ? Physical deconditioning 11/05/2019  ? Acute delirium 11/05/2019  ? Respiratory failure ([facility])   ? Acute metabolic encephalopathy   ? Encounter for central line placement   ?  Ventricular fibrillation ([facility]) 09/23/2019  ? Acute combined systolic and diastolic heart failure ([facility]) 09/23/2019  ? Ventricular tachycardia, polymorphic 09/23/2019  ? Pelvic pain affecting pregnancy 10/15/2015  ? Supervision of normal pregnancy in third trimester 09/28/2015  ? Poor weight gain of pregnancy 09/28/2015  ? Iron deficiency anemia of pregnancy 09/01/2015  ? Increased BMI (body mass index) 07/29/2015  ? ?Jamarria Real B. Rutherford Nail, M.S., CCC-SLP, CBIS ?Speech-Language Pathologist ?Rehabilitation Services ?Office [phone] ? ?[place], [place] ?04/29/2021, 2:21 PM ? ?   ?[facility] ?[place], [place], [address] ?Phone: [phone]   Fax:  [phone] ? ? ?Name: Beverly [name] ?MRN: [id number] ?Date of Birth: Jul 13, 1985 ? ?

## 2021-05-02 ENCOUNTER — Other Ambulatory Visit: Payer: Self-pay | Admitting: Physical Medicine & Rehabilitation

## 2021-05-02 DIAGNOSIS — G931 Anoxic brain damage, not elsewhere classified: Secondary | ICD-10-CM

## 2021-05-02 DIAGNOSIS — R1312 Dysphagia, oropharyngeal phase: Secondary | ICD-10-CM

## 2021-05-02 MED ORDER — METHYLPHENIDATE HCL 5 MG PO TABS: 5.0000 mg | ORAL_TABLET | Freq: Two times a day (BID) | ORAL | 0 refills | Status: DC

## 2021-05-05 ENCOUNTER — Ambulatory Visit: Payer: Medicaid Other | Admitting: Speech Pathology

## 2021-05-05 DIAGNOSIS — G931 Anoxic brain damage, not elsewhere classified: Secondary | ICD-10-CM

## 2021-05-05 DIAGNOSIS — R41841 Cognitive communication deficit: Secondary | ICD-10-CM

## 2021-05-05 DIAGNOSIS — R269 Unspecified abnormalities of gait and mobility: Secondary | ICD-10-CM | POA: Diagnosis not present

## 2021-05-06 NOTE — Therapy (Addendum)
[place] ?[facility] ?[address] ?[place], [place], [address] ?Phone: [phone]   Fax:  [phone] ? ?Speech Language Pathology Treatment ? ?Patient Details  ?Name: Beverly BrunsShatoya A [name] ?MRN: [id number] ?Date of Birth: 03/31/1985 ?Referring Provider (SLP): Ronni RumbleElizabeth Morre Rasmussen ? ? ?Encounter Date: 05/05/2021 ? ? End of Session - 05/06/21 1603   ? ? Visit Number 4   ? Number of Visits 25   ? Date for SLP Re-Evaluation 09/01/21   ? Authorization Type Medicaid [place] Access   ? Authorization Time Period 03/10/2021 thru 09/01/2021   ? Authorization - Visit Number 3   ? Authorization - Number of Visits 12   ? Progress Note Due on Visit 10   ? SLP Start Time 1300   ? SLP Stop Time  1400   ? SLP Time Calculation (min) 60 min   ? Activity Tolerance Patient tolerated treatment well   ? ?  ?  ? ?  ? ? ?Past Medical History:  ?Diagnosis Date  ? Brain injury   ? CAD (coronary artery disease)   ? HLD (hyperlipidemia)   ? Hypertension   ? last pregnancy  ? Ischemic cardiomyopathy   ? STEMI (ST elevation myocardial infarction) ([facility]) 09/2019  ? SCAD with aneurysmal dilation of proximal LAD.  ? Vaginal Pap smear, abnormal   ? when she was 36yo  ? Ventricular fibrillation ([facility]) 09/23/2019  ? ? ?Past Surgical History:  ?Procedure Laterality Date  ? CARDIAC CATHETERIZATION    ? IR GASTROSTOMY TUBE MOD SED  10/07/2019  ? IR GASTROSTOMY TUBE REMOVAL  07/02/2020  ? LEFT HEART CATH AND CORONARY ANGIOGRAPHY N/A 09/23/2019  ? Procedure: LEFT HEART CATH AND CORONARY ANGIOGRAPHY;  Surgeon: Yvonne KendallEnd, Christopher, MD;  Location: [facility];  Service: Cardiovascular;  Laterality: N/A;  ? SUBQ ICD IMPLANT N/A 11/26/2020  ? Procedure: SUBQ ICD IMPLANT;  Surgeon: Lanier PrudeLambert, Cameron T, MD;  [facility];  Service: Cardiovascular;  Laterality: N/A;  ? ? ?There were no vitals filed for this visit. ? ? Subjective Assessment - 05/06/21 1602   ? ? Subjective pt was pleasant, her mother  attended session at request of SLP to discuss functional goals   ? Patient is accompained by: Family member   ? Currently in Pain? No/denies   ? ?  ?  ? ?  ? ? ? ? ? ? ? ? ADULT SLP TREATMENT - 05/06/21 1607   ? ?  ? Treatment Provided  ? Treatment provided Cognitive-Linquistic   ?  ? Cognitive-Linquistic Treatment  ? Treatment focused on Cognition;Patient/family/caregiver education   ? Skilled Treatment Skilled treatment session focused on improving pt's cognitive communication abilities within everyday living. SLP facilitated this by providing the following interventions: ? ?[name] mom was asked to join today's session to discuss functional goals. Given the severity of pt's brain injury and resulting profound cognitive communication impairment as well as almost 2 years post onset, the prognosis for return to independence with bill paying, recalling her children's names, recalling her medicines, managing finances, helping prepare meals, providing care for her children, eating/drinking enough to pull pt's PEG is considered extremely poor at this time. In direct relation, pt is not likely to apply skills learned in isolated tasks during therapy to functional tasks. This was a very difficult concept for pt's mother to understand. SLP provided example of pt completing activities with success on the CT app but she has not been able to transfer  this ability to everyday activities. SLP facilitated ways to help pt increase her participation in various activities such as picking out her children's clothing, getting up at the same time as her children do for improved engage in activities with them.  ?  ? ?  ?  ? ?  ? ? ? ? ? SLP Short Term Goals - 03/10/21 1621   ? ?  ? SLP SHORT TERM GOAL #1  ? Title Pt will assist in selecting icons and information to customize SGD.   ? Baseline new goal, able to select icons on trial screen   ? Time 10   ? Period Weeks   ? Status New   ?  ? SLP SHORT TERM GOAL #2  ? Title Pt will use device  to state the names of her children and their ages in 7 out of 10 opportunities.   ? Baseline inaccurate recall of their names and ages   ? Time 10   ? Period --   sessions  ? Status New   ?  ? SLP SHORT TERM GOAL #3  ? Title Pt will use SGD to communicate basic need at home as reported by pt's mother.   ? Baseline Pt is reliant on her mother, pt doesn't initiate any communication about wants/needs   ? Time 10   ? Period --   session  ? Status New   ? ?  ?  ? ?  ? ? ? SLP Long Term Goals - 03/10/21 1623   ? ?  ? SLP LONG TERM GOAL #1  ? Title Pt will use multimodal communication to accurately answer basic questions about her children.   ? Baseline unable to recall 3 children, their ages and their names are untelligible to unfamiliar listener   ? Time 12   ? Period Weeks   ? Status New   ? Target Date 06/02/21   ? ?  ?  ? ?  ? ? ? Plan - 05/06/21 1607   ? ? Clinical Impression Statement During today's session, pt appeared less cognitive impaired that in previous session. Question if more natural interaction around a game promoted improved ability rather than traditional structure activity. Will continue to assess and speak with pt's mother regarding realistic goals. Skilled ST intervention continues to be required to increase pt's functional cognitive communication.   ? Speech Therapy Frequency 1x /week   ? Duration 12 weeks   ? Treatment/Interventions SLP instruction and feedback;Functional tasks;Multimodal communcation approach;Patient/family education   ? Potential to Achieve Goals Good   ? Potential Considerations Ability to learn/carryover information;Co-morbidities;Financial resources;Severity of impairments;Cooperation/participation level   ? SLP Home Exercise Plan provided, see pt instructions section   ? Consulted and Agree with Plan of Care Patient;Family member/caregiver   ? Family Member Consulted pt's mother   ? ?  ?  ? ?  ? ? ?Patient will benefit from skilled therapeutic intervention in order to improve  the following deficits and impairments:   ?Cognitive communication deficit ? ?Anoxic brain injury ([facility]) ? ? ? ?Problem List ?Patient Active Problem List  ? Diagnosis Date Noted  ? S/P ICD (internal cardiac defibrillator) procedure 11/27/2020  ? Hx of Cardiac arrest ([facility]) 11/26/2020  ? Abnormality of gait 03/16/2020  ? Sleep disturbance 03/16/2020  ? Oropharyngeal dysphagia 02/12/2020  ? Coronary artery disease involving native coronary artery of native heart without angina pectoris 12/12/2019  ? Ischemic cardiomyopathy 12/12/2019  ? Brain injury   ? Prediabetes   ?  Anoxic brain injury ([facility]) 11/05/2019  ? Dysphagia 11/05/2019  ? Physical deconditioning 11/05/2019  ? Acute delirium 11/05/2019  ? Respiratory failure ([facility])   ? Acute metabolic encephalopathy   ? Encounter for central line placement   ? Ventricular fibrillation ([facility]) 09/23/2019  ? Acute combined systolic and diastolic heart failure ([facility]) 09/23/2019  ? Ventricular tachycardia, polymorphic 09/23/2019  ? Pelvic pain affecting pregnancy 10/15/2015  ? Supervision of normal pregnancy in third trimester 09/28/2015  ? Poor weight gain of pregnancy 09/28/2015  ? Iron deficiency anemia of pregnancy 09/01/2015  ? Increased BMI (body mass index) 07/29/2015  ? ?Devyn Griffing B. Dreama Saa, M.S., CCC-SLP, CBIS ?Speech-Language Pathologist ?Rehabilitation Services ?Office [phone] ? ?Aceyn Kathol, CCC-SLP ?05/06/2021, 4:13 PM ? ?[place] ?[facility] ?[address] ?[place], [place], [address] ?Phone: [phone]   Fax:  [phone] ? ? ?Name: Beverly [name] ?MRN: [id number] ?Date of Birth: 11-06-1985 ? ?

## 2021-05-12 ENCOUNTER — Ambulatory Visit: Payer: Medicaid Other | Attending: Physician Assistant | Admitting: Speech Pathology

## 2021-05-12 DIAGNOSIS — G931 Anoxic brain damage, not elsewhere classified: Secondary | ICD-10-CM | POA: Insufficient documentation

## 2021-05-12 DIAGNOSIS — R2681 Unsteadiness on feet: Secondary | ICD-10-CM | POA: Diagnosis present

## 2021-05-12 DIAGNOSIS — R278 Other lack of coordination: Secondary | ICD-10-CM | POA: Insufficient documentation

## 2021-05-12 DIAGNOSIS — R41841 Cognitive communication deficit: Secondary | ICD-10-CM | POA: Diagnosis present

## 2021-05-12 DIAGNOSIS — R2689 Other abnormalities of gait and mobility: Secondary | ICD-10-CM | POA: Diagnosis present

## 2021-05-12 DIAGNOSIS — M6281 Muscle weakness (generalized): Secondary | ICD-10-CM | POA: Diagnosis present

## 2021-05-12 DIAGNOSIS — R262 Difficulty in walking, not elsewhere classified: Secondary | ICD-10-CM | POA: Diagnosis present

## 2021-05-12 DIAGNOSIS — R269 Unspecified abnormalities of gait and mobility: Secondary | ICD-10-CM | POA: Diagnosis present

## 2021-05-13 NOTE — Patient Instructions (Signed)
Allow pt to attempt activities before interceding ?

## 2021-05-13 NOTE — Therapy (Signed)
[place] ?[facility] ?[address] ?[place], [place], [address] ?Phone: [phone]   Fax:  [phone] ? ?Speech Language Pathology Treatment ? ?Patient Details  ?Name: Beverly BrunsShatoya A [name] ?MRN: [id number] ?Date of Birth: 05/16/1985 ?Referring Provider (SLP): Ronni RumbleElizabeth Morre Rasmussen ? ? ?Encounter Date: 05/12/2021 ? ? End of Session - 05/13/21 1342   ? ? Visit Number 5   ? Number of Visits 25   ? Date for SLP Re-Evaluation 09/01/21   ? Authorization Type Medicaid [place] Access   ? Authorization Time Period 03/10/2021 thru 09/01/2021   ? Authorization - Visit Number 4   ? Authorization - Number of Visits 12   ? Progress Note Due on Visit 10   ? SLP Start Time 1500   ? SLP Stop Time  1600   ? SLP Time Calculation (min) 60 min   ? Activity Tolerance Patient tolerated treatment well   ? ?  ?  ? ?  ? ? ?Past Medical History:  ?Diagnosis Date  ? Brain injury   ? CAD (coronary artery disease)   ? HLD (hyperlipidemia)   ? Hypertension   ? last pregnancy  ? Ischemic cardiomyopathy   ? STEMI (ST elevation myocardial infarction) ([facility]) 09/2019  ? SCAD with aneurysmal dilation of proximal LAD.  ? Vaginal Pap smear, abnormal   ? when she was 36yo  ? Ventricular fibrillation ([facility]) 09/23/2019  ? ? ?Past Surgical History:  ?Procedure Laterality Date  ? CARDIAC CATHETERIZATION    ? IR GASTROSTOMY TUBE MOD SED  10/07/2019  ? IR GASTROSTOMY TUBE REMOVAL  07/02/2020  ? LEFT HEART CATH AND CORONARY ANGIOGRAPHY N/A 09/23/2019  ? Procedure: LEFT HEART CATH AND CORONARY ANGIOGRAPHY;  Surgeon: Yvonne KendallEnd, Christopher, MD;  Location: [facility];  Service: Cardiovascular;  Laterality: N/A;  ? SUBQ ICD IMPLANT N/A 11/26/2020  ? Procedure: SUBQ ICD IMPLANT;  Surgeon: Lanier PrudeLambert, Cameron T, MD;  [facility];  Service: Cardiovascular;  Laterality: N/A;  ? ? ?There were no vitals filed for this visit. ? ? Subjective Assessment - 05/13/21 1336   ? ? Subjective pt was pleasant, her mother  attended session at request of SLP to discuss functional goals   ? Patient is accompained by: Family member   ? Currently in Pain? No/denies   ? ?  ?  ? ?  ? ? ? ? ? ? ? ? ADULT SLP TREATMENT - 05/13/21 0001   ? ?  ? Treatment Provided  ? Treatment provided Cognitive-Linquistic   ?  ? Cognitive-Linquistic Treatment  ? Treatment focused on Cognition;Patient/family/caregiver education   ? Skilled Treatment Skilled treatment session focused on facilitating functional participation within daily tasks.  ? ?Unfortunately, there are many barriers to pt's participation in tasks. In addition to pt's own impairments, her mother is taking care of pt's 3 children (one of them being a toddler), the toddler is frequently "underfoot" and her mother's desire to complete tasks for pt, suspect her mother is very emotional when seeing pt struggle with current impairment. In addition, pt's holding of her salvia prevents her from communicating and she is reliant on her mother to sip in a cup. SLP attempted placing a cup on the table with instructions to point to the cup when pt felt the need to spit. Pt able to perform immediately but despite maximal multimodal cues, pt unable to use this method for communicating need to spit.   ? ?  ?  ? ?  ? ? ?  SLP Education - 05/13/21 1341   ? ? Education Details promoting functional cognitive communication abilities in daily activities - shift focus from isolated therapy activities   ? Person(s) Educated Patient;Parent(s)   ? Methods Explanation;Demonstration;Verbal cues;Handout   ? Comprehension Need further instruction   ? ?  ?  ? ?  ? ? ? SLP Short Term Goals - 03/10/21 1621   ? ?  ? SLP SHORT TERM GOAL #1  ? Title Pt will assist in selecting icons and information to customize SGD.   ? Baseline new goal, able to select icons on trial screen   ? Time 10   ? Period Weeks   ? Status New   ?  ? SLP SHORT TERM GOAL #2  ? Title Pt will use device to state the names of her children and their ages in 7  out of 10 opportunities.   ? Baseline inaccurate recall of their names and ages   ? Time 10   ? Period --   sessions  ? Status New   ?  ? SLP SHORT TERM GOAL #3  ? Title Pt will use SGD to communicate basic need at home as reported by pt's mother.   ? Baseline Pt is reliant on her mother, pt doesn't initiate any communication about wants/needs   ? Time 10   ? Period --   session  ? Status New   ? ?  ?  ? ?  ? ? ? SLP Long Term Goals - 03/10/21 1623   ? ?  ? SLP LONG TERM GOAL #1  ? Title Pt will use multimodal communication to accurately answer basic questions about her children.   ? Baseline unable to recall 3 children, their ages and their names are untelligible to unfamiliar listener   ? Time 12   ? Period Weeks   ? Status New   ? Target Date 06/02/21   ? ?  ?  ? ?  ? ? ? Plan - 05/13/21 1342   ? ? Clinical Impression Statement [name] continues to struggle with shifting mind set of pt's cognition being targeted in isolated therapy sessions to targeting cognition by involving pt in activities throughout the day. In addition, there are many barriers to including pt in activities that are not changed with ST services. Spoke with pt and her mother about [facility] services.   ? Speech Therapy Frequency 1x /week   ? Duration 12 weeks   ? Treatment/Interventions SLP instruction and feedback;Functional tasks;Multimodal communcation approach;Patient/family education   ? Potential to Achieve Goals Good   ? Potential Considerations Ability to learn/carryover information;Co-morbidities;Financial resources;Severity of impairments;Cooperation/participation level   ? SLP Home Exercise Plan provided, see pt instructions section   ? Consulted and Agree with Plan of Care Patient;Family member/caregiver   ? Family Member Consulted pt's mother   ? ?  ?  ? ?  ? ? ?Patient will benefit from skilled therapeutic intervention in order to improve the following deficits and impairments:   ?Cognitive communication deficit ? ?Anoxic brain  injury ([facility]) ? ? ? ?Problem List ?Patient Active Problem List  ? Diagnosis Date Noted  ? S/P ICD (internal cardiac defibrillator) procedure 11/27/2020  ? Hx of Cardiac arrest ([facility]) 11/26/2020  ? Abnormality of gait 03/16/2020  ? Sleep disturbance 03/16/2020  ? Oropharyngeal dysphagia 02/12/2020  ? Coronary artery disease involving native coronary artery of native heart without angina pectoris 12/12/2019  ? Ischemic cardiomyopathy 12/12/2019  ? Brain injury ([facility]CC)   ?  Prediabetes   ? Anoxic brain injury ([facility]CC) 11/05/2019  ? Dysphagia 11/05/2019  ? Physical deconditioning 11/05/2019  ? Acute delirium 11/05/2019  ? Respiratory failure (HCC)   ? Acute metabolic encephalopathy   ? Encounter for central line placement   ? Ventricular fibrillation (HCC) 09/23/2019  ? Acute combined systolic and diastolic heart failure (HCC) 09/23/2019  ? Ventricular tachycardia, polymorphic ([facility]) 09/23/2019  ? Pelvic pain affecting pregnancy 10/15/2015  ? Supervision of normal pregnancy in third trimester 09/28/2015  ? Poor weight gain of pregnancy 09/28/2015  ? Iron deficiency anemia of pregnancy 09/01/2015  ? Increased BMI (body mass index) 07/29/2015  ? ?Katrena Stehlin B. Dreama Saa, M.S., CCC-SLP, CBIS ?Speech-Language Pathologist ?Rehabilitation Services ?Office [phone] ? ?Khylee Algeo, CCC-SLP ?05/13/2021, 1:45 PM ? ?[place] ?[facility] ?[address] ?[place], [place], [address] ?Phone: [phone]   Fax:  [phone] ? ? ?Name: Beverly [name] ?MRN: [id number] ?Date of Birth: December 13, 1985 ? ?

## 2021-05-18 ENCOUNTER — Ambulatory Visit: Payer: Medicaid Other | Admitting: Physical Medicine & Rehabilitation

## 2021-05-19 ENCOUNTER — Ambulatory Visit: Payer: Medicaid Other | Admitting: Speech Pathology

## 2021-05-19 ENCOUNTER — Ambulatory Visit: Payer: Medicaid Other

## 2021-05-19 DIAGNOSIS — R278 Other lack of coordination: Secondary | ICD-10-CM

## 2021-05-19 DIAGNOSIS — M6281 Muscle weakness (generalized): Secondary | ICD-10-CM

## 2021-05-19 DIAGNOSIS — R269 Unspecified abnormalities of gait and mobility: Secondary | ICD-10-CM

## 2021-05-19 DIAGNOSIS — R2689 Other abnormalities of gait and mobility: Secondary | ICD-10-CM

## 2021-05-19 DIAGNOSIS — G931 Anoxic brain damage, not elsewhere classified: Secondary | ICD-10-CM

## 2021-05-19 DIAGNOSIS — R41841 Cognitive communication deficit: Secondary | ICD-10-CM

## 2021-05-19 DIAGNOSIS — R2681 Unsteadiness on feet: Secondary | ICD-10-CM

## 2021-05-19 DIAGNOSIS — R262 Difficulty in walking, not elsewhere classified: Secondary | ICD-10-CM

## 2021-05-20 NOTE — Therapy (Signed)
[place] ?[facility] ?[place], [place], [address] ?Phone: [phone]   Fax:  [phone] ? ?Speech Language Pathology Discharge Summary ? ?Patient Details  ?Name: Beverly [name] ?MRN: [id number] ?Date of Birth: Dec 06, 1985 ?Referring Provider (SLP): Stephania Fragmin ? ? ?Encounter Date: 05/19/2021 ? ? End of Session - 05/20/21 1050   ? ? Visit Number 6   ? ?  ?  ? ?  ? ? ?Past Medical History:  ?Diagnosis Date  ? Brain injury ([place])   ? CAD (coronary artery disease)   ? HLD (hyperlipidemia)   ? Hypertension   ? last pregnancy  ? Ischemic cardiomyopathy   ? STEMI (ST elevation myocardial infarction) ([place]) 09/2019  ? SCAD with aneurysmal dilation of proximal LAD.  ? Vaginal Pap smear, abnormal   ? when she was 36yo  ? Ventricular fibrillation ([place]) 09/23/2019  ? ? ?Past Surgical History:  ?Procedure Laterality Date  ? CARDIAC CATHETERIZATION    ? IR GASTROSTOMY TUBE MOD SED  10/07/2019  ? IR GASTROSTOMY TUBE REMOVAL  07/02/2020  ? LEFT HEART CATH AND CORONARY ANGIOGRAPHY N/A 09/23/2019  ? Procedure: LEFT HEART CATH AND CORONARY ANGIOGRAPHY;  Surgeon: Nelva Bush, MD;  Location: [facility];  Service: Cardiovascular;  Laterality: N/A;  ? SUBQ ICD IMPLANT N/A 11/26/2020  ? Procedure: SUBQ ICD IMPLANT;  Surgeon: Vickie Epley, MD;  Location: [facility];  Service: Cardiovascular;  Laterality: N/A;  ? ? ?There were no vitals filed for this visit. ? ? ? ? ? ? ? ? ? ? ? ? ? SLP Short Term Goals - 05/20/21 1051   ? ?  ? SLP SHORT TERM GOAL #1  ? Title Pt will assist in selecting icons and information to customize SGD.   ? Status Not Met   ?  ? SLP SHORT TERM GOAL #2  ? Title Pt will use device to state the names of her children and their ages in 23 out of 38 opportunities.   ? Status Not Met   ?  ? SLP SHORT TERM GOAL #3  ? Title Pt will use SGD to communicate basic need at home as reported by pt's mother.   ? Status Not Met   ?  ?  SLP SHORT TERM GOAL #4  ? Title --   ? ?  ?  ? ?  ? ? ? SLP Long Term Goals - 05/20/21 1052   ? ?  ? SLP LONG TERM GOAL #1  ? Title Pt will use multimodal communication to accurately answer basic questions about her children.   ? Status Not Met   ? ?  ?  ? ?  ? ? ? Plan - 05/20/21 1050   ? ? Clinical Impression Statement Along with PT, SLP spoke with pt and her mother about transitioning pt to [facility] services to increase pt's functional independence within the home environmnet. At this time, pt is not making progress in traditional isolated ST sessions and would benefit from attending a vocational rehab or day program as well as possible Home Health services.   ? ?  ?  ? ?  ? ? ?Problem List ?Patient Active Problem List  ? Diagnosis Date Noted  ? S/P ICD (internal cardiac defibrillator) procedure 11/27/2020  ? Hx of Cardiac arrest ([place]) 11/26/2020  ? Abnormality of gait 03/16/2020  ? Sleep disturbance 03/16/2020  ? Oropharyngeal dysphagia 02/12/2020  ? Coronary artery disease involving  native coronary artery of native heart without angina pectoris 12/12/2019  ? Ischemic cardiomyopathy 12/12/2019  ? Brain injury ([place])   ? Prediabetes   ? Anoxic brain injury ([place]) 11/05/2019  ? Dysphagia 11/05/2019  ? Physical deconditioning 11/05/2019  ? Acute delirium 11/05/2019  ? Respiratory failure ([place])   ? Acute metabolic encephalopathy   ? Encounter for central line placement   ? Ventricular fibrillation ([place]) 09/23/2019  ? Acute combined systolic and diastolic heart failure ([place]) 09/23/2019  ? Ventricular tachycardia, polymorphic ([place]) 09/23/2019  ? Pelvic pain affecting pregnancy 10/15/2015  ? Supervision of normal pregnancy in third trimester 09/28/2015  ? Poor weight gain of pregnancy 09/28/2015  ? Iron deficiency anemia of pregnancy 09/01/2015  ? Increased BMI (body mass index) 07/29/2015  ? ?Milee Qualls B. Rutherford Nail, M.S., CCC-SLP, CBIS ?Speech-Language Pathologist ?Rehabilitation Services ?Office [phone] ? ?[place],  CCC-SLP ?05/20/2021, 10:53 AM ? ?[place] ?[facility] ?[place], [place], [address] ?Phone: [phone]   Fax:  [phone] ? ? ?Name: Beverly [name] ?MRN: [id number] ?Date of Birth: 14-Feb-1985 ? ?

## 2021-05-20 NOTE — Therapy (Signed)
[place] ?[facility] ?[place], [place], [address] ?Phone: [phone]   Fax:  [phone] ? ?May 20, 2021  ? ?No Recipients ? ?Physical Therapy Discharge Summary ? ?Patient: Beverly [name]  ?MRN: [id number]  ?Date of Birth: 1986-01-27  ? ?Diagnosis: Anoxic brain injury ([place]) ? ?Abnormality of gait and mobility ? ?Difficulty in walking, not elsewhere classified ? ?Muscle weakness (generalized) ? ?Unsteadiness on feet ? ?Other lack of coordination ? ?Other abnormalities of gait and mobility ?Referring Provider (PT): Naaman Plummer ? ? ?The above patient had been seen in Physical Therapy 3 times  ?Patient presents to PT today in good spirits with her mother. Non- billable visit today as PT discussed with patient and mother the appropriateness of outpatient PT services. PT discussed concerns that outpatient may not be the most appropriate setting at this time due to irregular frequency of attendance. Discussed that Home health PT may be more appropriate at this time to focus on any household functional activities and to work on tasks at home to make her more safe and independent. Mother agreed and talked to Stormy Fabian, SLP who is also recommending transition to [facility] services. No charge for today's session and patient to be discharged today with plan for mother to seek referral for [facility] services. ? ? Plan - 05/19/21 1055   ? ? Clinical Impression Statement Patient presents to PT today in good spirits with her mother. Non- billable visit today as PT discussed with patient and mother the appropriateness of outpatient PT services. PT discussed concerns that outpatient may not be the most appropriate setting at this time due to irregular frequency of attendance. Discussed that Home health PT may be more appropriate at this time to focus on any household functional activities and to work on tasks at home to make her more safe and independent. Mother agreed and talked to  Stormy Fabian, SLP who is also recommending transition to [facility] services. No charge for today's session and patient to be discharged today with plan for mother to seek referral for [facility] services.   ? Personal Factors and Comorbidities Behavior Pattern;Transportation;Comorbidity 3+   ? Comorbidities HTN, CAD, HLD   ? Examination-Activity Limitations Bed Mobility;Dressing;Hygiene/Grooming;Caring for Others;Stairs;Stand;Toileting;Transfers;Sit;Bathing;Continence;Lift;Squat;Reach Overhead   ? Examination-Participation Restrictions Occupation;Medication Management;Cleaning;Yard Work;Personal Finances;Driving   ? Stability/Clinical Decision Making Evolving/Moderate complexity   ? Rehab Potential Good   ? PT Frequency 1x / week   ? PT Duration 12 weeks   ? PT Treatment/Interventions ADLs/Self Care Home Management;Aquatic Therapy;Electrical Stimulation;DME Instruction;Gait training;Stair training;Functional mobility training;Therapeutic activities;Therapeutic exercise;Balance training;Neuromuscular re-education;Wheelchair mobility training;Patient/family education;Orthotic Fit/Training;Manual techniques;Passive range of motion;Cryotherapy;Moist Heat   ? PT Next Visit Plan Discharge today and plan to transition to [facility] services   ? PT Home Exercise Plan Access Code: [email or web address]   ? Consulted and Agree with Plan of Care Patient;Family member/caregiver   ? Family Member Consulted Mom   ? ?  ?  ? ?  ? ? ?Sincerely, ? ? ?Lewis Moccasin, PT ? ? ?CC ?No Recipients ? ?[place] ?[facility] ?[place], [place], [address] ?Phone: [phone]   Fax:  [phone] ? ?Patient: Beverly [name]  ?MRN: [id number]  ?Date of Birth: 07-04-1985  ? ? ? ?

## 2021-05-26 ENCOUNTER — Ambulatory Visit: Payer: Medicaid Other | Admitting: Speech Pathology

## 2021-05-26 ENCOUNTER — Ambulatory Visit: Payer: Medicaid Other

## 2021-06-01 ENCOUNTER — Encounter: Payer: Medicaid Other | Attending: Physical Medicine & Rehabilitation | Admitting: Physical Medicine & Rehabilitation

## 2021-06-01 ENCOUNTER — Encounter: Payer: Self-pay | Admitting: Physical Medicine & Rehabilitation

## 2021-06-01 DIAGNOSIS — G931 Anoxic brain damage, not elsewhere classified: Secondary | ICD-10-CM | POA: Insufficient documentation

## 2021-06-01 DIAGNOSIS — R1312 Dysphagia, oropharyngeal phase: Secondary | ICD-10-CM | POA: Diagnosis present

## 2021-06-01 MED ORDER — METHYLPHENIDATE HCL 10 MG PO TABS: 10.0000 mg | ORAL_TABLET | Freq: Two times a day (BID) | ORAL | 0 refills | Status: DC

## 2021-06-01 NOTE — Patient Instructions (Signed)
PLEASE FEEL FREE TO CALL OUR OFFICE WITH ANY PROBLEMS OR QUESTIONS ([phone])      

## 2021-06-01 NOTE — Progress Notes (Signed)
? ?Subjective:  ? ? Patient ID: Beverly [name], female    DOB: Nov 21, 1985, 36 y.o.   MRN: [id number] ? ?HPI ? ?Beverly [name] is here in follow up of her ABI. She has been on ritalin without any adverse effects.  It has made a difference with her initiation to a certain extent.  She still has her moments where she will get on track with an activity or has struggles with initiation.  Speech therapy feels that she has plateaued to a certain extent.  She has made some continued gains with PT.  Mom notices that she is much more engaging as a whole but really will depend on the day.  Her sleep has improved and she is resting consistently from night to night.  She seems to be tolerating the Ritalin without any adverse effects.  Her blood pressure and heart rate have been generally well controlled. ? ?Pain Inventory ?Average Pain 0 ?Pain Right Now 0 ?My pain is  n/a ? ?LOCATION OF PAIN  n/a ? ?BOWEL ?Number of stools per week: 7 ?Incontinent Yes  ? ?BLADDER ?Normal ?Bladder incontinence Yes  ?Frequent urination No  ?Leakage with coughing No  ?Difficulty starting stream No  ?Incomplete bladder emptying No  ? ? ?Mobility ?walk with assistance ?use a walker ?how many minutes can you walk? 5 ?ability to climb steps?  yes ?do you drive?  no ?use a wheelchair ?needs help with transfers ? ?Function ?disabled: date disabled 09/23/19 ?I need assistance with the following:  feeding, dressing, bathing, toileting, meal prep, household duties, and shopping ? ?Neuro/Psych ?bladder control problems ?bowel control problems ?trouble walking ?spasms ?confusion ? ?Prior Studies ?Any changes since last visit?  no ? ?Physicians involved in your care ?Any changes since last visit?  no ? ? ?Family History  ?Problem Relation Age of Onset  ? Hyperlipidemia Mother   ? Hypertension Mother   ? Diabetes Maternal Grandmother   ? Seizures Maternal Grandmother   ? Thyroid disease Maternal Grandmother   ? Diabetes Paternal Grandmother   ? Rashes / Skin problems  Son   ?     ezcema  ? Seizures Son   ? ?Social History  ? ?Socioeconomic History  ? Marital status: Single  ?  Spouse name: Not on file  ? Number of children: 3  ? Years of education: Not on file  ? Highest education level: Not on file  ?Occupational History  ? Occupation: oncology  ?  Employer: [employer]  ?  Comment: NA  ?Tobacco Use  ? Smoking status: Never  ? Smokeless tobacco: Never  ?Vaping Use  ? Vaping Use: Never used  ?Substance and Sexual Activity  ? Alcohol use: Not Currently  ?  Alcohol/week: 0.0 standard drinks  ?  Comment: quit 2017  ? Drug use: No  ?  Frequency: 5.0 times per week  ?  Types: Marijuana  ?  Comment: stopped when she found out she was pregnant  ? Sexual activity: Not Currently  ?  Partners: Male  ?  Birth control/protection: None  ?Other Topics Concern  ? Not on file  ?Social History Narrative  ? 01/06/20 lives with mom and her children  ? ?Social Determinants of Health  ? ?Financial Resource Strain: Not on file  ?Food Insecurity: Not on file  ?Transportation Needs: Not on file  ?Physical Activity: Not on file  ?Stress: Not on file  ?Social Connections: Not on file  ? ?Past Surgical History:  ?Procedure Laterality Date  ?  CARDIAC CATHETERIZATION    ? IR GASTROSTOMY TUBE MOD SED  10/07/2019  ? IR GASTROSTOMY TUBE REMOVAL  07/02/2020  ? LEFT HEART CATH AND CORONARY ANGIOGRAPHY N/A 09/23/2019  ? Procedure: LEFT HEART CATH AND CORONARY ANGIOGRAPHY;  Surgeon: Yvonne Kendall, MD;  Location: [facility];  Service: Cardiovascular;  Laterality: N/A;  ? SUBQ ICD IMPLANT N/A 11/26/2020  ? Procedure: SUBQ ICD IMPLANT;  Surgeon: Lanier Prude, MD;  Location: [facility];  Service: Cardiovascular;  Laterality: N/A;  ? ?Past Medical History:  ?Diagnosis Date  ? Brain injury ([facility])   ? CAD (coronary artery disease)   ? HLD (hyperlipidemia)   ? Hypertension   ? last pregnancy  ? Ischemic cardiomyopathy   ? STEMI (ST elevation myocardial infarction) ([facility]) 09/2019  ? SCAD with  aneurysmal dilation of proximal LAD.  ? Vaginal Pap smear, abnormal   ? when she was 36yo  ? Ventricular fibrillation ([facility]) 09/23/2019  ? ?BP (!) 109/56   Pulse 80   Ht 5\' 9"  (1.753 m)   SpO2 98%   BMI 23.04 kg/m?  ? ?Opioid Risk Score:   ?Fall Risk Score:  `1 ? ?Depression screen PHQ 2/9 ? ? ?  06/01/2021  ?  3:14 PM 03/23/2021  ?  3:03 PM 02/02/2021  ?  2:22 PM 09/08/2020  ?  3:45 PM 07/14/2020  ?  3:27 PM 04/15/2020  ?  3:14 PM 03/16/2020  ? 11:26 AM  ?Depression screen PHQ 2/9  ?Decreased Interest 0 0 0 0 0 0 0  ?Down, Depressed, Hopeless 0 0 0 0 0 0 0  ?PHQ - 2 Score 0 0 0 0 0 0 0  ?Altered sleeping     0    ?PHQ-9 Score     0    ?  ? ?Review of Systems  ?Constitutional: Negative.   ?HENT: Negative.    ?Eyes: Negative.   ?Respiratory: Negative.    ?Cardiovascular: Negative.   ?Gastrointestinal: Negative.   ?Endocrine: Negative.   ?Genitourinary: Negative.   ?Musculoskeletal:  Positive for gait problem.  ?Skin: Negative.   ?Allergic/Immunologic: Negative.   ?Hematological: Negative.   ?Psychiatric/Behavioral:  Positive for confusion.   ? ?   ?Objective:  ? Physical Exam ? ?General: No acute distress ?HEENT: NCAT, EOMI, oral membranes moist ?Cards: reg rate  ?Chest: normal effort ?Abdomen: Soft, NT, ND, PEG ?Skin: dry, intact ?Extremities: no edema ?Psych: pleasant and appropriate  ?Skin: Warm and dry.  Intact. ?   ?Musc: No edema in extremities.  No tenderness in extremities. ?Neuro: Patient is increasingly interactive.  She is oriented to person and very alert.  She engages much more with me today and answers questions.  Speech remains very dysarthric.  She still has secretions pooling in her mouth and she tends to hold these instead of swallowing them.  She follows simple commands sometimes with delay.    ?  ?  ?  ?  ?  ?Assessment & Plan:  ?1. Anoxic BI - substantial attention, initiation deficits as well as memory deficits ?            MRI/CTs unremarkable ?            -Recent ICD placement.     Would start with  a very low dose such as 2.5  . ?-She is winding down therapies at [facility].             ?-Ritalin seems to have made a difference  with her attention and initiation.    ?=increase to 10mg  bid. ?2. Dysphagia: ?            -Mom continues pushing oral food but patient still struggling with oral motor apraxia and coordination. ?            Cont PEG as a back up for now ?            -[facility] to [facility] to see if they can make any headway with her speech cognition and swallowing ?  -Patient still struggles with oral secretions although scopolamine patches helped to a certain extent ?3. Gait abnormality ?            Cont ambulation with assistance  ?             RW for balance ?            PT --would like her to continue on an outpt basis ?3. Sleep disturbance: improved ?            Melatonin 3 mg daily --can take with trazodone ?           -trazodone at night time scheduled.  this has helped her sleep/wake quite a bit ? ? ?Fifteen minutes of face to face patient care time were spent during this visit. All questions were encouraged and answered.  Follow up with me in 2 mos .  ? ?   ? ? ?

## 2021-06-02 ENCOUNTER — Telehealth: Payer: Self-pay | Admitting: *Deleted

## 2021-06-02 ENCOUNTER — Ambulatory Visit: Payer: Medicaid Other | Admitting: Speech Pathology

## 2021-06-02 ENCOUNTER — Ambulatory Visit: Payer: Medicaid Other

## 2021-06-02 NOTE — Telephone Encounter (Signed)
Referral faxed to [facility] with office note, order, and face sheet. ?

## 2021-06-09 ENCOUNTER — Ambulatory Visit: Payer: Medicaid Other

## 2021-06-09 ENCOUNTER — Ambulatory Visit: Payer: Medicaid Other | Admitting: Speech Pathology

## 2021-06-09 ENCOUNTER — Ambulatory Visit (INDEPENDENT_AMBULATORY_CARE_PROVIDER_SITE_OTHER): Payer: Medicaid Other

## 2021-06-09 DIAGNOSIS — I255 Ischemic cardiomyopathy: Secondary | ICD-10-CM

## 2021-06-09 LAB — CUP PACEART REMOTE DEVICE CHECK
Battery Remaining Percentage: 93 %
Date Time Interrogation Session: 20230504080600
Implantable Pulse Generator Implant Date: 20221021
Pulse Gen Serial Number: 161805

## 2021-06-16 ENCOUNTER — Encounter: Payer: Medicaid Other | Admitting: Speech Pathology

## 2021-06-16 ENCOUNTER — Ambulatory Visit: Payer: Medicaid Other

## 2021-06-16 ENCOUNTER — Ambulatory Visit: Payer: Medicaid Other | Admitting: Speech Pathology

## 2021-06-17 ENCOUNTER — Encounter: Payer: Self-pay | Admitting: Internal Medicine

## 2021-06-17 ENCOUNTER — Ambulatory Visit (INDEPENDENT_AMBULATORY_CARE_PROVIDER_SITE_OTHER): Payer: Medicaid Other | Admitting: Nurse Practitioner

## 2021-06-17 ENCOUNTER — Other Ambulatory Visit
Admission: RE | Admit: 2021-06-17 | Discharge: 2021-06-17 | Disposition: A | Discharge status: Home / Self Care | Payer: Medicaid Other | Attending: Nurse Practitioner | Admitting: Nurse Practitioner

## 2021-06-17 VITALS — BP 90/66 | HR 88 | Ht 69.0 in | Wt 162.1 lb

## 2021-06-17 DIAGNOSIS — E785 Hyperlipidemia, unspecified: Secondary | ICD-10-CM

## 2021-06-17 DIAGNOSIS — G931 Anoxic brain damage, not elsewhere classified: Secondary | ICD-10-CM | POA: Diagnosis not present

## 2021-06-17 DIAGNOSIS — I2542 Coronary artery dissection: Secondary | ICD-10-CM | POA: Diagnosis not present

## 2021-06-17 DIAGNOSIS — I5042 Chronic combined systolic (congestive) and diastolic (congestive) heart failure: Secondary | ICD-10-CM

## 2021-06-17 DIAGNOSIS — I5041 Acute combined systolic (congestive) and diastolic (congestive) heart failure: Secondary | ICD-10-CM | POA: Diagnosis present

## 2021-06-17 DIAGNOSIS — I255 Ischemic cardiomyopathy: Secondary | ICD-10-CM

## 2021-06-17 LAB — COMPREHENSIVE METABOLIC PANEL
ALT: 29 U/L (ref 0–44)
AST: 26 U/L (ref 15–41)
Albumin: 3.9 g/dL (ref 3.5–5.0)
Alkaline Phosphatase: 80 U/L (ref 38–126)
Anion gap: 7 (ref 5–15)
BUN: 11 mg/dL (ref 6–20)
CO2: 26 mmol/L (ref 22–32)
Calcium: 9.4 mg/dL (ref 8.9–10.3)
Chloride: 104 mmol/L (ref 98–111)
Creatinine, Ser: 0.43 mg/dL — ABNORMAL LOW (ref 0.44–1.00)
GFR, Estimated: >60 mL/min (ref >60)
Glucose, Bld: 115 mg/dL — ABNORMAL HIGH (ref 70–99)
Potassium: 4 mmol/L (ref 3.5–5.1)
Sodium: 137 mmol/L (ref 135–145)
Total Bilirubin: 0.4 mg/dL (ref 0.3–1.2)
Total Protein: 7.9 g/dL (ref 6.5–8.1)

## 2021-06-17 LAB — LIPID PANEL
Cholesterol: 117 mg/dL (ref 0–200)
HDL: 49 mg/dL (ref >40)
LDL Cholesterol: 55 mg/dL (ref 0–99)
Total CHOL/HDL Ratio: 2.4 RATIO
Triglycerides: 67 mg/dL (ref <150)
VLDL: 13 mg/dL (ref 0–40)

## 2021-06-17 LAB — LDL CHOLESTEROL, DIRECT: Direct LDL: 55.3 mg/dL (ref 0–99)

## 2021-06-17 NOTE — Patient Instructions (Addendum)
Medication Instructions:  ? ?Your physician recommends that you continue on your current medications as directed. Please refer to the Current Medication list given to you today. ? ?*If you need a refill on your cardiac medications before your next appointment, please call your pharmacy* ? ? ?Lab Work: ? ?Your physician recommends that you return for lab work (Fasting Lipid, Direct LDL, CMP) in: after your appointment today ? ?- You will need to be fasting. Please do not have anything to eat or drink after midnight the morning you have the lab work. You may only have water or black coffee with no cream or sugar.  ? ?- Please go to the [facility]. You will check in at the front desk to the right as you walk into the atrium. Valet Parking is offered if needed. ?- No appointment needed. You may go any day between 7 am and 6 pm. ? ? ?Testing/Procedures: ?None ordered ? ? ?Follow-Up: ?At [facility], you and your health needs are our priority.  As part of our continuing mission to provide you with exceptional heart care, we have created designated Provider Care Teams.  These Care Teams include your primary Cardiologist (physician) and Advanced Practice Providers (APPs -  Physician Assistants and Nurse Practitioners) who all work together to provide you with the care you need, when you need it. ? ?We recommend signing up for the patient portal called "MyChart".  Sign up information is provided on this After Visit Summary.  MyChart is used to connect with patients for Virtual Visits (Telemedicine).  Patients are able to view lab/test results, encounter notes, upcoming appointments, etc.  Non-urgent messages can be sent to your provider as well.   ?To learn more about what you can do with MyChart, go to [email or web address].   ? ?Your next appointment:   ?6 month(s) ? ?The format for your next appointment:   ?In Person ? ?Provider:   ? ?ONLY WITH ?Nelva Bush, MD  ? ? ?Other Instructions ? ? ?Important  Information About Sugar ? ? ? ? ? ? ?

## 2021-06-17 NOTE — Progress Notes (Signed)
? ?Follow-up Outpatient Visit ?Date: 06/17/2021 ? ?Primary Care Provider: ?Delman Cheadle, PA ?[address] ?[place] [address] ? ?Chief Complaint: Follow-up ischemic cardiomyopathy and HFrEF ? ?HPI:  Beverly [name] is a 36 y.o. 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  Ms. Schwier was last seen in November 2022, at which time she was feeling fairly well and recovering from subcutaneous ICD implantation.  No issues were identified at the time of her EP follow-up with Dr. Lalla Brothers in January 2023.  After discussion with Dr. Okey Dupre earlier this year, Dr. Riley Kill added methylphenidate to help improve Ms. Brent's attention. ? ?Ms. Gambrell presents with her mother today.  Overall, Ms. Mulvihill has been stable since her last visit.  She denies chest pain or dyspnea.  Activity is quite limited and her mother notes that because Medicaid will not pay for PT/speech at home, they are currently seeking an in office practice.  In the interim, Ms. Afshar's mother has been trying to walk her regularly but notes that her efforts do not seem to be as good as what physical therapy can provide and she is concerned that Ms. Doble might be getting weaker.  She continues to be fed via feeding tube.  Her mother sometimes notes mild pedal edema.  Patient and mother deny complaints of palpitations, PND, orthopnea, dizziness, syncope, or early satiety. ? ?-------------------------------------------------------------------------------------------------- ? ?Past Medical History:  ?Diagnosis Date  ? Brain injury (HCC)   ? CAD (coronary artery disease)   ? HLD (hyperlipidemia)   ? Hypertension   ? last pregnancy  ? Ischemic cardiomyopathy   ? STEMI (ST elevation myocardial infarction) (HCC) 09/2019  ?  SCAD with aneurysmal dilation of proximal LAD.  ? Vaginal Pap smear, abnormal   ? when she was 36yo  ? Ventricular fibrillation (HCC) 09/23/2019  ? ?Past Surgical History:  ?Procedure Laterality Date  ? CARDIAC CATHETERIZATION    ? IR GASTROSTOMY TUBE MOD SED  10/07/2019  ? IR GASTROSTOMY TUBE REMOVAL  07/02/2020  ? LEFT HEART CATH AND CORONARY ANGIOGRAPHY N/A 09/23/2019  ? Procedure: LEFT HEART CATH AND CORONARY ANGIOGRAPHY;  Surgeon: Yvonne Kendall, MD;  Location: [facility] INVASIVE CV LAB;  Service: Cardiovascular;  Laterality: N/A;  ? SUBQ ICD IMPLANT N/A 11/26/2020  ? Procedure: SUBQ ICD IMPLANT;  Surgeon: Lanier Prude, MD;  Location: [facility] INVASIVE CV LAB;  Service: Cardiovascular;  Laterality: N/A;  ? ? ?Current Meds  ?Medication Sig  ? Apoaequorin (PREVAGEN PO) Take by mouth.  ? atorvastatin (LIPITOR) 80 MG tablet PLACE 1 TABLET (80 MG TOTAL) INTO FEEDING TUBE DAILY.  ? carvedilol (COREG) 6.25 MG tablet PLACE 1 TABLET (6.25 MG TOTAL) INTO FEEDING TUBE 2 (TWO) TIMES DAILY WITH A MEAL.  ? clobetasol (TEMOVATE) 0.05 % external solution Apply topically 2 (two) times daily.  ? [employer] ASPIRIN ADULT LOW DOSE 81 MG chewable tablet PLACE 1 TABLET (81 MG TOTAL) INTO FEEDING TUBE DAILY.  ? Ensure Plus (ENSURE PLUS) LIQD Take by mouth. Drinks 5-6 cans per day  ? Melatonin 5 MG CAPS Place 5 mg into feeding tube at bedtime.  ? methylphenidate (RITALIN) 10 MG tablet Take 1 tablet (10 mg total) by mouth 2 (two) times daily. With breakfast and lunch  ? Multiple Vitamin (MULTIVITAMIN WITH MINERALS) TABS tablet Place 1 tablet into feeding tube  daily.  ? traZODone (DESYREL) 50 MG tablet TAKE 1 TABLET BY MOUTH EVERY DAY AT BEDTIME AS NEEDED FOR SLEEP  ? triamcinolone cream (KENALOG) 0.1 % SMARTSIG:Sparingly Topical Twice Daily  ? Water For Irrigation, Sterile (FREE WATER) SOLN Place 200 mLs into feeding tube every 6 (six) hours.  ? ? ?Allergies: Patient has no known allergies. ? ?Social History  ? ?Tobacco Use  ? Smoking status:  Never  ? Smokeless tobacco: Never  ?Vaping Use  ? Vaping Use: Never used  ?Substance Use Topics  ? Alcohol use: Not Currently  ?  Alcohol/week: 0.0 standard drinks  ?  Comment: quit 2017  ? Drug use: No  ?  Frequency: 5.0 times per week  ?  Types: Marijuana  ?  Comment: stopped when she found out she was pregnant  ? ? ?Family History  ?Problem Relation Age of Onset  ? Hyperlipidemia Mother   ? Hypertension Mother   ? Diabetes Maternal Grandmother   ? Seizures Maternal Grandmother   ? Thyroid disease Maternal Grandmother   ? Diabetes Paternal Grandmother   ? Rashes / Skin problems Son   ?     ezcema  ? Seizures Son   ? ? ?Review of Systems: ?A 12-system review of systems was performed and was negative except as noted in the HPI. ? ?-------------------------------------------------------------------------------------------------- ? ?Physical Exam: ?BP 90/66 (BP Location: Left Arm, Patient Position: Sitting, Cuff Size: Normal)   Pulse 88   Ht 5\' 9"  (1.753 m)   Wt 162 lb 2 oz (73.5 kg)   BMI 23.94 kg/m?  ? ?General:  NAD.  Able to answer simple questions. ?Neck: No JVD or HJR. ?Lungs: Clear to auscultation bilaterally without wheezes or crackles. ?Heart: Regular rate and rhythm without murmurs, rubs, or gallops. ?Abdomen: Soft, nontender, nondistended. ?Extremities: No lower extremity edema. ? ?EKG: Regular sinus rhythm, 88, rightward axis, baseline artifact, poor R wave progression-no acute changes-personally reviewed ? ?Lab Results  ?Component Value Date  ? WBC 5.7 11/03/2020  ? HGB 13.1 11/03/2020  ? HCT 38.8 11/03/2020  ? MCV 88 11/03/2020  ? PLT 360 11/03/2020  ? ? ?Lab Results  ?Component Value Date  ? NA 137 06/17/2021  ? K 4.0 06/17/2021  ? CL 104 06/17/2021  ? CO2 26 06/17/2021  ? BUN 11 06/17/2021  ? CREATININE 0.43 (L) 06/17/2021  ? GLUCOSE 115 (H) 06/17/2021  ? ALT 29 06/17/2021  ? ? ?Lab Results  ?Component Value Date  ? CHOL 117 06/17/2021  ? HDL 49 06/17/2021  ? LDLCALC 55 06/17/2021  ? LDLDIRECT 48  03/15/2020  ? TRIG 67 06/17/2021  ? CHOLHDL 2.4 06/17/2021  ? ? ?-------------------------------------------------------------------------------------------------- ? ?ASSESSMENT AND PLAN: ? ?1.  Chronic combined systolic and diastolic congestive heart failure/ischemic cardiomyopathy: EF previously 30 to 35% in August 2021-improved EF by echo November 2021 at 50 to 55%.  Status post subcutaneous AICD.  She is euvolemic on examination today and denies dyspnea or orthopnea.  She remains on beta-blocker therapy and soft blood pressures limit GDMT. ? ?2.  Coronary artery disease/spontaneous coronary artery dissection: Status post VF arrest in the setting of scad of the ostial LAD 2021, complicated by anoxic brain injury.  Patient has not been having any chest pain or dyspnea and remains on aspirin, beta-blocker, and statin therapy. ? ?3.  Ventricular fibrillation arrest: Status post subcutaneous AICD and followed by Dr. 2022. ? ?4.  Anoxic brain injury: Followed closely by Dr. Lalla Brothers.  Her mother is concerned about not  being able to receive physical therapy and speech therapy at home and they are currently seeking in office practices. ? ?5.  Hyperlipidemia: She remains on statin therapy.  She had lipids drawn today with an LDL of 55. ? ?6.  Disposition: Follow-up in 6 months or sooner if necessary. ? ?Nicolasa Duckinghristopher Rhylen Shaheen, NP ?06/17/2021 ?6:05 PM ? ?

## 2021-06-21 ENCOUNTER — Telehealth: Payer: Self-pay | Admitting: *Deleted

## 2021-06-21 DIAGNOSIS — G931 Anoxic brain damage, not elsewhere classified: Secondary | ICD-10-CM

## 2021-06-21 NOTE — Telephone Encounter (Signed)
Referral sent 

## 2021-06-21 NOTE — Telephone Encounter (Signed)
Notified. 

## 2021-06-21 NOTE — Telephone Encounter (Signed)
Mrs Dimperio called and request a PT referral to [facility]. ?

## 2021-06-22 NOTE — Progress Notes (Signed)
Remote ICD transmission.   

## 2021-06-23 ENCOUNTER — Ambulatory Visit: Payer: Medicaid Other

## 2021-06-23 ENCOUNTER — Encounter: Payer: Medicaid Other | Admitting: Speech Pathology

## 2021-06-23 ENCOUNTER — Ambulatory Visit: Payer: Medicaid Other | Admitting: Speech Pathology

## 2021-06-30 ENCOUNTER — Encounter: Payer: Medicaid Other | Admitting: Speech Pathology

## 2021-06-30 ENCOUNTER — Other Ambulatory Visit: Payer: Self-pay | Admitting: Physical Medicine & Rehabilitation

## 2021-06-30 ENCOUNTER — Ambulatory Visit: Payer: Medicaid Other

## 2021-06-30 ENCOUNTER — Ambulatory Visit: Payer: Medicaid Other | Admitting: Speech Pathology

## 2021-06-30 DIAGNOSIS — G479 Sleep disorder, unspecified: Secondary | ICD-10-CM

## 2021-07-02 ENCOUNTER — Other Ambulatory Visit: Payer: Self-pay | Admitting: Physical Medicine & Rehabilitation

## 2021-07-02 DIAGNOSIS — R1312 Dysphagia, oropharyngeal phase: Secondary | ICD-10-CM

## 2021-07-02 DIAGNOSIS — G931 Anoxic brain damage, not elsewhere classified: Secondary | ICD-10-CM

## 2021-07-05 ENCOUNTER — Other Ambulatory Visit: Payer: Self-pay | Admitting: Physical Medicine & Rehabilitation

## 2021-07-05 DIAGNOSIS — G931 Anoxic brain damage, not elsewhere classified: Secondary | ICD-10-CM

## 2021-07-05 DIAGNOSIS — R1312 Dysphagia, oropharyngeal phase: Secondary | ICD-10-CM

## 2021-07-05 MED ORDER — METHYLPHENIDATE HCL 10 MG PO TABS: 10.0000 mg | ORAL_TABLET | Freq: Two times a day (BID) | ORAL | 0 refills | Status: DC

## 2021-07-07 ENCOUNTER — Ambulatory Visit: Payer: Medicaid Other

## 2021-07-07 ENCOUNTER — Encounter: Payer: Medicaid Other | Admitting: Speech Pathology

## 2021-07-07 ENCOUNTER — Ambulatory Visit: Payer: Medicaid Other | Admitting: Speech Pathology

## 2021-07-14 ENCOUNTER — Ambulatory Visit: Payer: Medicaid Other

## 2021-07-14 ENCOUNTER — Ambulatory Visit: Payer: Medicaid Other | Admitting: Speech Pathology

## 2021-07-14 ENCOUNTER — Encounter: Payer: Medicaid Other | Admitting: Speech Pathology

## 2021-07-21 ENCOUNTER — Ambulatory Visit: Payer: Medicaid Other

## 2021-07-21 ENCOUNTER — Ambulatory Visit: Payer: Medicaid Other | Admitting: Speech Pathology

## 2021-07-21 ENCOUNTER — Ambulatory Visit: Payer: Medicaid Other | Attending: Physician Assistant | Admitting: Physical Therapy

## 2021-07-21 DIAGNOSIS — R262 Difficulty in walking, not elsewhere classified: Secondary | ICD-10-CM | POA: Diagnosis present

## 2021-07-21 DIAGNOSIS — R2681 Unsteadiness on feet: Secondary | ICD-10-CM | POA: Diagnosis present

## 2021-07-21 DIAGNOSIS — R269 Unspecified abnormalities of gait and mobility: Secondary | ICD-10-CM | POA: Insufficient documentation

## 2021-07-21 DIAGNOSIS — R2689 Other abnormalities of gait and mobility: Secondary | ICD-10-CM | POA: Diagnosis present

## 2021-07-21 DIAGNOSIS — G931 Anoxic brain damage, not elsewhere classified: Secondary | ICD-10-CM | POA: Diagnosis not present

## 2021-07-21 NOTE — Therapy (Signed)
[place] [facility] MAIN [facility] SERVICES [address] [place], [place], [address] Phone: [phone]   Fax:  [phone]  Physical Therapy Evaluation  Patient Details  Name: Beverly [name] MRN: [id number] Date of Birth: 1985/08/03 Referring Provider (PT): Jerrilyn Cairo Date: 07/21/2021   PT End of Session - 07/21/21 1522     Visit Number 1    Number of Visits 12    Date for PT Re-Evaluation 10/13/21    Authorization Type Medicaid approval- 27 visits for OT/PT/SLP combined    Progress Note Due on Visit 10    PT Start Time 1430    PT Stop Time 1515    PT Time Calculation (min) 45 min    Equipment Utilized During Treatment Gait belt    Activity Tolerance Patient tolerated treatment well             Past Medical History:  Diagnosis Date   Brain injury (HCC)    CAD (coronary artery disease)    HLD (hyperlipidemia)    Hypertension    last pregnancy   Ischemic cardiomyopathy    STEMI (ST elevation myocardial infarction) (HCC) 09/2019   SCAD with aneurysmal dilation of proximal LAD.   Vaginal Pap smear, abnormal    when she was 36yo   Ventricular fibrillation (HCC) 09/23/2019    Past Surgical History:  Procedure Laterality Date   CARDIAC CATHETERIZATION     IR GASTROSTOMY TUBE MOD SED  10/07/2019   IR GASTROSTOMY TUBE REMOVAL  07/02/2020   LEFT HEART CATH AND CORONARY ANGIOGRAPHY N/A 09/23/2019   Procedure: LEFT HEART CATH AND CORONARY ANGIOGRAPHY;  Surgeon: Yvonne Kendall, MD;  Location: [facility] INVASIVE CV LAB;  Service: Cardiovascular;  Laterality: N/A;   SUBQ ICD IMPLANT N/A 11/26/2020   Procedure: SUBQ ICD IMPLANT;  Surgeon: Lanier Prude, MD;  Location: [facility] INVASIVE CV LAB;  Service: Cardiovascular;  Laterality: N/A;    There were no vitals filed for this visit.    Subjective Assessment - 07/21/21 1433     Subjective Patient mostly non-verbal but father is present and reports no new falls or LOB. Pt is non verbal and  her father presets with her to privide history. Pt reports he legs continue to be weak. Pt father reports she needs to practice standing on her own to build leg strength. Pt father reports when she is getting out of the car she picks her self up and is able to use her strength to pick her body up out of the car. Pt still has significant difficulty with her balance when she is attempting to walk and ambulate. Pt father is not primary caregiver so some of his history is not copmlete at this time but he is unsure if they are copmleting HEP at this time.    Patient is accompained by: Family member   Dad   Pertinent History Patient is a 36 year old with diagnois of anoxic brain injury. She was receiving outpatient PT until Feb. 2022 last year but ran out of visits. Patient has past medical history of Anoxic Brain Injury 09/2019, CAD, HLD, HTN, ISCHEMIC CARDIMYOPATHY, STEMI, V-FIB, ICD on 11/29/2020  .On 09/23/19 patient presented to ED 2 weeks postpartum (had emergent C-Section) with chest pain. In ED sustained VFib Cardiac Arrest requiring intubation. EKG showed ST elevation, Cath Lab showed aneurysmal dissection of the LAD was identified. Course complicated by encephalopathy, anoxic brain injury, enterobacterial PNA. Patient is currently living with mom. Mother reports she  is abel to walk some with a walker with supervision but primarily ambulates with HHA of mother who is her guardian. Mother also reports she requires assist for all transfers and ADL's.    Limitations Sitting;Standing;Walking;House hold activities    How long can you sit comfortably? no issues    Patient Stated Goals walk better                Static Standing Balance  Normal Able to maintain standing balance against maximal resistance   Good Able to maintain standing balance against moderate resistance   Good-/Fair+ Able to maintain standing balance against minimal resistance   Fair Able to stand unsupported without UE support and  without LOB for 1-2 min   Fair- Requires Min A and UE support to maintain standing without loss of balance X  Poor+ Requires mod A and UE support to maintain standing without loss of balance   Poor Requires max A and UE support to maintain standing balance without loss        GAIT: Patient ambulates with rolling walker but requires verbal cues and tactile cues for proximity to walker to prevent excessive trunk flexion and short step length gait pattern.  Transfers: With transfer from transport chair to mat table patient requires upper extremity on chair arms and is able to complete transfer to chair without arms at this time.  Patient does have difficulty with following directions and these attacks were completed in a private room without distractions of clinic environment.  Patient requires min assist to prevent loss of balance with this activity as well as max assist for equipment management as patient tries to pull chair toward her if she is standing which limits her ability to safely turn.  OUTCOME MEASURES: TEST Outcome Interpretation  5 times sit<>stand 61.54 sec >60 yo, >15 sec indicates increased risk for falls  10 meter walk test             .25    m/s <1.0 m/s indicates increased risk for falls; limited community ambulator              FOTO  Sent home for mother ( primary caregiver) to complete                   Objective measurements completed on examination: See above findings.                PT Education - 07/21/21 1442     Education Details Discussed POC    Person(s) Educated Patient    Methods Explanation    Comprehension Verbalized understanding              PT Short Term Goals - 07/21/21 1528       PT SHORT TERM GOAL #1   Title Patient will perform all transfers with Stand by assist using least restrictive AD,  BUE Support, and reaching back for safety to improve her functional independence and decrease fall risk.    Baseline  07/21/21: requires Min A and significant UE utilizaiton    Time 6    Period Weeks    Status Unable to assess               PT Long Term Goals - 07/21/21 1456       PT LONG TERM GOAL #1   Title Patient/caregiver will report indepence with HEP with caregiver assistance.    Baseline 6/15: not copmelting HEP regularly/ father unsure of home program/ activities  Time 8    Period Weeks    Status Unable to assess    Target Date 09/15/21      PT LONG TERM GOAL #2   Title Pt will decrease 5XSTS with UE use as needed by at least  15 seconds in order to demonstrate clinically significant improvement in LE strength.    Baseline 6/15: 61.54 sec with UE A    Time 12    Period Weeks    Status Unable to assess    Target Date 10/13/21      PT LONG TERM GOAL #3   Title Pt will decrease TUG to below 34 seconds/decrease in order to demonstrate decreased fall risk.    Baseline 03/17/2021= 43.49 sec using RW    Time 12    Period Weeks    Status New    Target Date 10/13/21      PT LONG TERM GOAL #4   Title Patient will demo ability to ambulate > 400 ft w/ LRAD versus hand held assist min assist to demonstrate improved household mobility and short community distances.    Baseline 03/17/2021= 125 feet with Min A and RW and cues for proximity to walker    Time 12    Period Weeks    Status New    Target Date 10/13/21      PT LONG TERM GOAL #5   Title Pt will improve FOTO to target score to display perceived improvements in ability to complete ADL's.    Baseline 6/15: no assessed secondary to father not knowing answers to questions, ptsent home with QR code for mother to complete survey at home.    Time 12    Period Weeks    Status New    Target Date 10/13/21      PT LONG TERM GOAL #6   Title Pt will increase by at least 0.15 m/s in order to demonstrate clinically significant improvement in community ambulation.    Baseline 03/17/2021= 0.24 m/s using RW and min A from PT for walker  management    Time 12    Period Weeks    Status New    Target Date 10/13/21                    Plan - 07/21/21 1524     Clinical Impression Statement Patient presents to physical therapy for reevaluation.  Patient presents with her father and his subjective history of patient experience with home health is very limited secondary to patient's mother being primary caregiver.  Patient's father does report continued weakness in lower extremities and difficulty with transfers.  Patient does suffer 5 times sit to stand and 10 m walk test and both display significant limitations and overall functions could benefit from physical therapy.  Sit to stands patient demonstrates unsafe technique of holding onto chair and is unable to perform sit to stand without upper extremity assist due to lower extremity weakness as well as instability.  With 10 m walk test patient is unable to stay attentive to ambulation task display significantly slowed gait speed and requires cues for proximity to walker for safety throughout.  Physical therapist implored father to obtain further subjective history regarding functional tasks patient could benefit from improving on as her father is unsure as to her day-to-day activities.  Patient will benefit from skilled physical therapy services in order to improve her lower extremity strength, endurance, ambulatory capacity, and quality of life.    Personal Factors  and Comorbidities Behavior Pattern;Transportation;Comorbidity 3+    Comorbidities HTN, CAD, HLD    Examination-Activity Limitations Bed Mobility;Dressing;Hygiene/Grooming;Caring for Others;Stairs;Stand;Toileting;Transfers;Sit;Bathing;Continence;Lift;Squat;Reach Overhead    Examination-Participation Restrictions Occupation;Medication Management;Cleaning;Yard Work;Personal Finances;Driving    Stability/Clinical Decision Making Evolving/Moderate complexity    Rehab Potential Good    PT Frequency 1x / week    PT  Duration 12 weeks    PT Treatment/Interventions ADLs/Self Care Home Management;Aquatic Therapy;Electrical Stimulation;DME Instruction;Gait training;Stair training;Functional mobility training;Therapeutic activities;Therapeutic exercise;Balance training;Neuromuscular re-education;Wheelchair mobility training;Patient/family education;Orthotic Fit/Training;Manual techniques;Passive range of motion;Cryotherapy;Moist Heat    PT Next Visit Plan Discharge today and plan to transition to [facility] services    PT Home Exercise Plan Access Code: [email or web address]    Consulted and Agree with Plan of Care Patient;Family member/caregiver    Family Member Consulted Mom             Patient will benefit from skilled therapeutic intervention in order to improve the following deficits and impairments:  Abnormal gait, Decreased balance, Decreased endurance, Decreased mobility, Difficulty walking, Impaired tone, Impaired sensation, Pain, Impaired UE functional use, Decreased strength, Decreased safety awareness, Decreased knowledge of use of DME, Decreased coordination, Decreased activity tolerance, Decreased cognition, Decreased range of motion, Increased muscle spasms  Visit Diagnosis: Abnormality of gait and mobility  Difficulty in walking, not elsewhere classified  Unsteadiness on feet     Problem List Patient Active Problem List   Diagnosis Date Noted   S/P ICD (internal cardiac defibrillator) procedure 11/27/2020   Hx of Cardiac arrest (HCC) 11/26/2020   Abnormality of gait 03/16/2020   Sleep disturbance 03/16/2020   Oropharyngeal dysphagia 02/12/2020   Coronary artery disease involving native coronary artery of native heart without angina pectoris 12/12/2019   Ischemic cardiomyopathy 12/12/2019   Brain injury (HCC)    Prediabetes    Anoxic brain injury (HCC) 11/05/2019   Dysphagia 11/05/2019   Physical deconditioning 11/05/2019   Acute delirium 11/05/2019   Respiratory failure (HCC)    Acute metabolic  encephalopathy    Encounter for central line placement    Ventricular fibrillation (HCC) 09/23/2019   Acute combined systolic and diastolic heart failure (HCC) 09/23/2019   Ventricular tachycardia, polymorphic (HCC) 09/23/2019   Pelvic pain affecting pregnancy 10/15/2015   Supervision of normal pregnancy in third trimester 09/28/2015   Poor weight gain of pregnancy 09/28/2015   Iron deficiency anemia of pregnancy 09/01/2015   Increased BMI (body mass index) 07/29/2015    Norman Herrlich, PT 07/21/2021, 3:37 PM  [place] [facility] MAIN [facility] SERVICES [address] [place], [place], [address] Phone: [phone]   Fax:  [phone]  Name: Beverly [name] MRN: [id number] Date of Birth: 1985-08-02

## 2021-07-28 ENCOUNTER — Ambulatory Visit: Payer: Medicaid Other | Admitting: Physical Therapy

## 2021-07-28 DIAGNOSIS — R269 Unspecified abnormalities of gait and mobility: Secondary | ICD-10-CM

## 2021-07-28 DIAGNOSIS — R2681 Unsteadiness on feet: Secondary | ICD-10-CM

## 2021-07-28 DIAGNOSIS — R262 Difficulty in walking, not elsewhere classified: Secondary | ICD-10-CM

## 2021-07-28 DIAGNOSIS — R2689 Other abnormalities of gait and mobility: Secondary | ICD-10-CM

## 2021-07-28 NOTE — Therapy (Signed)
OUTPATIENT PHYSICAL THERAPY TREATMENT NOTE   Patient Name: Beverly [name] MRN: [id number] DOB:1985-08-03, 36 y.o., female Today's Date: 07/28/2021  PCP: Ane Payment, [place] REFERRING PROVIDER: Meredith Staggers, MD   PT End of Session - 07/28/21 1608     Visit Number 2    Number of Visits 12    Date for PT Re-Evaluation 10/13/21    Authorization Type Medicaid approval- 39 visits for OT/PT/SLP combined    Progress Note Due on Visit 10    PT Start Time 1603    PT Stop Time 1645    PT Time Calculation (min) 42 min    Equipment Utilized During Treatment Gait belt    Activity Tolerance Patient tolerated treatment well    Behavior During Therapy Impulsive             Past Medical History:  Diagnosis Date   Brain injury ([place])    CAD (coronary artery disease)    HLD (hyperlipidemia)    Hypertension    last pregnancy   Ischemic cardiomyopathy    STEMI (ST elevation myocardial infarction) ([place]) 09/2019   SCAD with aneurysmal dilation of proximal LAD.   Vaginal Pap smear, abnormal    when she was 36yo   Ventricular fibrillation ([place]) 09/23/2019   Past Surgical History:  Procedure Laterality Date   CARDIAC CATHETERIZATION     IR GASTROSTOMY TUBE MOD SED  10/07/2019   IR GASTROSTOMY TUBE REMOVAL  07/02/2020   LEFT HEART CATH AND CORONARY ANGIOGRAPHY N/A 09/23/2019   Procedure: LEFT HEART CATH AND CORONARY ANGIOGRAPHY;  Surgeon: Nelva Bush, MD;  Location: [facility];  Service: Cardiovascular;  Laterality: N/A;   SUBQ ICD IMPLANT N/A 11/26/2020   Procedure: SUBQ ICD IMPLANT;  Surgeon: Vickie Epley, MD;  Location: [facility];  Service: Cardiovascular;  Laterality: N/A;   Patient Active Problem List   Diagnosis Date Noted   S/P ICD (internal cardiac defibrillator) procedure 11/27/2020   Hx of Cardiac arrest ([place]) 11/26/2020   Abnormality of gait 03/16/2020   Sleep disturbance 03/16/2020   Oropharyngeal dysphagia 02/12/2020    Coronary artery disease involving native coronary artery of native heart without angina pectoris 12/12/2019   Ischemic cardiomyopathy 12/12/2019   Brain injury ([place])    Prediabetes    Anoxic brain injury ([place]) 11/05/2019   Dysphagia 11/05/2019   Physical deconditioning 11/05/2019   Acute delirium 11/05/2019   Respiratory failure ([place])    Acute metabolic encephalopathy    Encounter for central line placement    Ventricular fibrillation ([place]) 09/23/2019   Acute combined systolic and diastolic heart failure ([place]) 09/23/2019   Ventricular tachycardia, polymorphic ([place]) 09/23/2019   Pelvic pain affecting pregnancy 10/15/2015   Supervision of normal pregnancy in third trimester 09/28/2015   Poor weight gain of pregnancy 09/28/2015   Iron deficiency anemia of pregnancy 09/01/2015   Increased BMI (body mass index) 07/29/2015    REFERRING DIAG: G93.1 (ICD-10-CM) - Anoxic brain injury ([place])  THERAPY DIAG:  Abnormality of gait and mobility  Difficulty in walking, not elsewhere classified  Unsteadiness on feet  Other abnormalities of gait and mobility  Rationale for Evaluation and Treatment Rehabilitation  PERTINENT HISTORY: Patient is a 36 year old with diagnois of anoxic brain injury. She was receiving outpatient PT until Feb. 2022 last year but ran out of visits. Patient has past medical history of Anoxic Brain Injury 09/2019, CAD, HLD, HTN, ISCHEMIC CARDIMYOPATHY, STEMI, V-FIB, ICD on 11/29/2020  .On 09/23/19 patient  presented to ED 2 weeks postpartum (had emergent C-Section) with chest pain. In ED sustained VFib Cardiac Arrest requiring intubation. EKG showed ST elevation, Cath Lab showed aneurysmal dissection of the LAD was identified. Course complicated by encephalopathy, anoxic brain injury, enterobacterial PNA. Patient is currently living with mom. Mother reports she is abel to walk some with a walker with supervision but primarily ambulates with HHA of mother who is her guardian. Mother  also reports she requires assist for all transfers and ADL's.   PRECAUTIONS: Fall  SUBJECTIVE: Patient mother reports they were unable to receive home health due to lack of insurance coverage.  Patient's mother also reports they have been going intermittently to see speech therapist at [facility] but has not build their insurance.  No loss of balance or falls since last session patient's mother reports continued decrease in mobility as well as weakness.  PAIN:  Are you having pain? No     TODAY'S TREATMENT:  07/28/21 The beginning were performed in private treatment room to minimize distractions from environment   Exercise/Activity Sets/Reps/Time/ Resistance Assistance Charge type Comments        Seated march  2*10 x 2.5# AW    Physical therapist provides hands and boxing makes for external cues for motion.  Patient still requires significant cues including movement of each side as well as verbal cues for proper performance  LAQ kicking pads  2*10 *2.5# AW    Physical therapist provides hands and boxing makes for external cues for motion.  Patient still requires significant cues including movement of each side as well as verbal cues for proper performance    Ambulation with RW  4*length of the long hallway approximately 70 feet   Significant cueing required in order to maintain proximity to walker as well as ambulating in straight line in hallway.  Patient also requires motivation as she tends to sit multiple times during ambulation and is told that there is no chair behind her she needs to continue to ambulate.  Patient's mom was in hallway with transport chair in case patient did become too fatigued however believe this is patient's lack of motivation for ambulation.  STS  2*5   Physical therapist guarding posteriorly holding onto gait belt and quires significant verbal and tactile cueing for proper performance.  Patient requires verbal cues for holding onto chair rails with each repetition as  she looks confused and what task is performing.                                      Treatment Provided this session   Pt educated throughout session about proper posture and technique with exercises. Improved exercise technique, movement at target joints, use of target muscles after min to mod verbal, visual, tactile cues. Note: Portions of this document were prepared using Dragon voice recognition software and although reviewed may contain unintentional dictation errors in syntax, grammar, or spelling.    PATIENT EDUCATION: Education details: HEP Person educated: Cytogeneticist: Programmer, multimedia, Facilities manager, Actor cues, and Verbal cues Education comprehension: returned demonstration and needs further education   HOME EXERCISE PROGRAM: Access Code: [facility] URL: [email or web address] Date: 07/28/2021 Prepared by: Thresa Ross  Exercises - Seated Long Arc Quad  - 1 x daily - 7 x weekly - 2 sets - 10 reps -Sit to stand- 1 x daily - 7 x weekly - 2 sets - 10 reps  Marching: 1  x daily - 7 x weekly - 2 sets - 10 reps    PT Short Term Goals -       PT SHORT TERM GOAL #1   Title Patient will perform all transfers with Stand by assist using least restrictive AD,  BUE Support, and reaching back for safety to improve her functional independence and decrease fall risk.    Baseline 07/21/21: requires Min A and significant UE utilizaiton    Time 6    Period Weeks    Status Unable to assess              PT Long Term Goals       PT LONG TERM GOAL #1   Title Patient/caregiver will report indepence with HEP with caregiver assistance.    Baseline 6/15: not copmelting HEP regularly/ father unsure of home program/ activities    Time 8    Period Weeks    Status Unable to assess    Target Date 09/15/21      PT LONG TERM GOAL #2   Title Pt will decrease 5XSTS with UE use as needed by at least  15 seconds in order to demonstrate clinically significant  improvement in LE strength.    Baseline 6/15: 61.54 sec with UE A    Time 12    Period Weeks    Status Unable to assess    Target Date 10/13/21      PT LONG TERM GOAL #3   Title Pt will decrease TUG to below 34 seconds/decrease in order to demonstrate decreased fall risk.    Baseline 03/17/2021= 43.49 sec using RW    Time 12    Period Weeks    Status New    Target Date 10/13/21      PT LONG TERM GOAL #4   Title Patient will demo ability to ambulate > 400 ft w/ LRAD versus hand held assist min assist to demonstrate improved household mobility and short community distances.    Baseline 03/17/2021= 125 feet with Min A and RW and cues for proximity to walker    Time 12    Period Weeks    Status New    Target Date 10/13/21      PT LONG TERM GOAL #5   Title Pt will improve FOTO to target score to display perceived improvements in ability to complete ADL's.    Baseline 6/15: no assessed secondary to father not knowing answers to questions, ptsent home with QR code for mother to complete survey at home.    Time 12    Period Weeks    Status New    Target Date 10/13/21      PT LONG TERM GOAL #6   Title Pt will increase by at least 0.15 m/s in order to demonstrate clinically significant improvement in community ambulation.    Baseline 03/17/2021= 0.24 m/s using RW and min A from PT for walker management    Time 12    Period Weeks    Status New    Target Date 10/13/21              Plan -     Clinical Impression Statement Patient presents to physical therapy with mother.  Patient requires significant cues for all activities performed throughout session.  Patient requires verbal, tactile, and visual cues throughout each activity she performs going simple activities like long arc quads and seated marching.  Patient is also continually distracted by environment and performs all  activities and hallway away from other patients and away from busy gym environment.  Performed all seated  TherEX and sit to stands in private treatment room to accommodate this.  Patient will continue to benefit from skilled physical therapy to improve her lower extremity strength, ambulatory capacity, improve her ability to transfer, and prevent falls and improve quality of life.   Personal Factors and Comorbidities Behavior Pattern;Transportation;Comorbidity 3+    Comorbidities HTN, CAD, HLD    Examination-Activity Limitations Bed Mobility;Dressing;Hygiene/Grooming;Caring for Others;Stairs;Stand;Toileting;Transfers;Sit;Bathing;Continence;Lift;Squat;Reach Overhead    Examination-Participation Restrictions Occupation;Medication Management;Cleaning;Yard Work;Personal Finances;Driving    Stability/Clinical Decision Making Evolving/Moderate complexity    Rehab Potential Good    PT Frequency 1x / week    PT Duration 12 weeks    PT Treatment/Interventions ADLs/Self Care Home Management;Aquatic Therapy;Electrical Stimulation;DME Instruction;Gait training;Stair training;Functional mobility training;Therapeutic activities;Therapeutic exercise;Balance training;Neuromuscular re-education;Wheelchair mobility training;Patient/family education;Orthotic Fit/Training;Manual techniques;Passive range of motion;Cryotherapy;Moist Heat    PT Next Visit Plan Discharge today and plan to transition to [facility] services    PT Home Exercise Plan Access Code: [email or web address]    Consulted and Agree with Plan of Care Patient;Family member/caregiver    Family Member Consulted Mom               Particia Lather, [place] 07/28/2021, 5:04 PM

## 2021-08-03 ENCOUNTER — Encounter: Payer: Medicaid Other | Attending: Physical Medicine & Rehabilitation | Admitting: Physical Medicine & Rehabilitation

## 2021-08-03 ENCOUNTER — Encounter: Payer: Self-pay | Admitting: Physical Medicine & Rehabilitation

## 2021-08-03 ENCOUNTER — Other Ambulatory Visit: Payer: Self-pay | Admitting: Internal Medicine

## 2021-08-03 VITALS — BP 111/76 | HR 108

## 2021-08-03 DIAGNOSIS — G931 Anoxic brain damage, not elsewhere classified: Secondary | ICD-10-CM

## 2021-08-03 DIAGNOSIS — R1312 Dysphagia, oropharyngeal phase: Secondary | ICD-10-CM | POA: Diagnosis present

## 2021-08-03 MED ORDER — METHYLPHENIDATE HCL 10 MG PO TABS: 15.0000 mg | ORAL_TABLET | Freq: Two times a day (BID) | ORAL | 0 refills | Status: DC

## 2021-08-03 NOTE — Patient Instructions (Signed)
PLEASE FEEL FREE TO CALL OUR OFFICE WITH ANY PROBLEMS OR QUESTIONS ([phone])      

## 2021-08-03 NOTE — Progress Notes (Signed)
Subjective:    Patient ID: Beverly [name], female    DOB: November 23, 1985, 36 y.o.   MRN: [id number]  HPI Beverly [name] is here in follow-up of her anoxic brain injury.  I last saw her about 2 months ago.  She has had her ups and downs as far as her attention initiation are concerned.  She has some really good days where she is more interactive.  In general she is much more alert every day.  She still struggles with her eating and speech.  She will vocalize periodically.  Mom notes that she can perseverate quite a bit.  We increased her Ritalin to 10 mg and she has tolerated without any blood pressure heart rate issues.  She remains n.p.o. and on tube feeds for all nutrition and medication.  Sleep has been reasonable with trazodone and melatonin.   Pain Inventory Average Pain 0 Pain Right Now 0 My pain is  no pain  LOCATION OF PAIN  no pain  BOWEL Number of stools per week: 5-7 Oral laxative use No  Type of laxative . Enema or suppository use No  History of colostomy No  Incontinent Yes   BLADDER Normal In and out cath, frequency . Able to self cath  . Bladder incontinence No  Frequent urination No  Leakage with coughing No  Difficulty starting stream No  Incomplete bladder emptying No    Mobility walk with assistance use a walker ability to climb steps?  yes do you drive?  no  Function not employed: date last employed 7/21 disabled: date disabled 09/23/19 I need assistance with the following:  feeding, dressing, bathing, toileting, meal prep, household duties, and shopping  Neuro/Psych weakness trouble walking spasms confusion  Prior Studies Any changes since last visit?  no  Physicians involved in your care Any changes since last visit?  no   Family History  Problem Relation Age of Onset   Hyperlipidemia Mother    Hypertension Mother    Diabetes Maternal Grandmother    Seizures Maternal Grandmother    Thyroid disease Maternal Grandmother    Diabetes Paternal  Grandmother    Rashes / Skin problems Son        ezcema   Seizures Son    Social History   Socioeconomic History   Marital status: Single    Spouse name: Not on file   Number of children: 3   Years of education: Not on file   Highest education level: Not on file  Occupational History   Occupation: oncology    Employer: [employer]    Comment: NA  Tobacco Use   Smoking status: Never   Smokeless tobacco: Never  Vaping Use   Vaping Use: Never used  Substance and Sexual Activity   Alcohol use: Not Currently    Alcohol/week: 0.0 standard drinks of alcohol    Comment: quit 2017   Drug use: No    Frequency: 5.0 times per week    Types: Marijuana    Comment: stopped when she found out she was pregnant   Sexual activity: Not Currently    Partners: Male    Birth control/protection: None  Other Topics Concern   Not on file  Social History Narrative   01/06/20 lives with mom and her children   Social Determinants of Health   Financial Resource Strain: Not on file  Food Insecurity: Not on file  Transportation Needs: Not on file  Physical Activity: Not on file  Stress: Not on file  Social Connections: Not on file   Past Surgical History:  Procedure Laterality Date   CARDIAC CATHETERIZATION     IR GASTROSTOMY TUBE MOD SED  10/07/2019   IR GASTROSTOMY TUBE REMOVAL  07/02/2020   LEFT HEART CATH AND CORONARY ANGIOGRAPHY N/A 09/23/2019   Procedure: LEFT HEART CATH AND CORONARY ANGIOGRAPHY;  Surgeon: Yvonne Kendall, MD;  Location: [facility] INVASIVE [facility];  Service: Cardiovascular;  Laterality: N/A;   SUBQ ICD IMPLANT N/A 11/26/2020   Procedure: SUBQ ICD IMPLANT;  Surgeon: Lanier Prude, MD;  Location: [facility];  Service: Cardiovascular;  Laterality: N/A;   Past Medical History:  Diagnosis Date   Brain injury ([facility])    CAD (coronary artery disease)    HLD (hyperlipidemia)    Hypertension    last pregnancy   Ischemic cardiomyopathy    STEMI (ST elevation  myocardial infarction) ([facility]) 09/2019   SCAD with aneurysmal dilation of proximal LAD.   Vaginal Pap smear, abnormal    when she was 36yo   Ventricular fibrillation ([facility]) 09/23/2019   BP 111/76   Pulse (!) 108   SpO2 100%   Opioid Risk Score:   Fall Risk Score:  `1  Depression screen PHQ 2/9     08/03/2021    3:15 PM 06/01/2021    3:14 PM 03/23/2021    3:03 PM 02/02/2021    2:22 PM 09/08/2020    3:45 PM 07/14/2020    3:27 PM 04/15/2020    3:14 PM  Depression screen PHQ 2/9  Decreased Interest 0 0 0 0 0 0 0  Down, Depressed, Hopeless 0 0 0 0 0 0 0  PHQ - 2 Score 0 0 0 0 0 0 0  Altered sleeping      0   PHQ-9 Score      0      Review of Systems  Musculoskeletal:  Positive for gait problem.       Spasms  Neurological:  Positive for weakness.  All other systems reviewed and are negative.     Objective:   Physical Exam  General: No acute distress HEENT: NCAT, EOMI, oral membranes moist Cards: reg rate  Chest: normal effort Abdomen: Soft, NT, ND Skin: dry, intact Extremities: no edema Psych: pleasant and more engaging. Skin: Warm and dry.  Intact.    Musc: No edema in extremities.  No tenderness in extremities. Neuro: She is alert and interactive today.  She often held her cheeks in a full position as if she was holding in some air or fluid.  She was able to tell me that she was in the [place] and that this was [place].  She remains quite dysarthric.  Secretions still pooled in her mouth.  She has delays in her processing and demonstrates apraxia in general.    Assessment & Plan:  1. Anoxic BI - substantial attention, initiation deficits as well as memory deficits             MRI/CTs unremarkable             -Recent ICD placement.   .             -Ritalin has made a difference with her attention and initiation.    =increase to 15 mg bid. -Heart rate and blood pressure have been under good control -Consider Sinemet trial for her apraxia -will be starting  HHPT soon thru [place].  2. Dysphagia:             -  ongoing oral motor apraxia and coordination.             Cont PEG as a back up for now             [facility] SLP for cognition and swallowing              -Patient continues to have significant oral secretions although scopolamine patches helped to a certain extent 3. Gait abnormality             Cont ambulation with assistance               RW for balance              3. Sleep disturbance: improved             Melatonin 3 mg daily --can take with trazodone            -trazodone at night time scheduled.  this has helped her sleep/wake quite a bit   Fifteen minutes of face to face patient care time were spent during this visit. All questions were encouraged and answered.  Follow up with me in 2 mos .

## 2021-08-04 ENCOUNTER — Ambulatory Visit: Payer: Medicaid Other

## 2021-08-04 DIAGNOSIS — R269 Unspecified abnormalities of gait and mobility: Secondary | ICD-10-CM

## 2021-08-04 DIAGNOSIS — R2681 Unsteadiness on feet: Secondary | ICD-10-CM

## 2021-08-04 DIAGNOSIS — R262 Difficulty in walking, not elsewhere classified: Secondary | ICD-10-CM

## 2021-08-04 NOTE — Therapy (Signed)
OUTPATIENT PHYSICAL THERAPY TREATMENT NOTE   Patient Name: Beverly [name] MRN: [id number] DOB:09-May-1985, 36 y.o., female Today's Date: 08/04/2021  PCP: Delman Cheadle, PA REFERRING PROVIDER: Ranelle Oyster, MD   PT End of Session - 08/04/21 1344     Visit Number 3    Number of Visits 12    Date for PT Re-Evaluation 10/13/21    Authorization Type Medicaid approval- 27 visits for OT/PT/SLP combined    Progress Note Due on Visit 10    PT Start Time 1345    PT Stop Time 1418    PT Time Calculation (min) 33 min    Equipment Utilized During Treatment Gait belt    Activity Tolerance Patient tolerated treatment well    Behavior During Therapy Impulsive              Past Medical History:  Diagnosis Date   Brain injury (HCC)    CAD (coronary artery disease)    HLD (hyperlipidemia)    Hypertension    last pregnancy   Ischemic cardiomyopathy    STEMI (ST elevation myocardial infarction) (HCC) 09/2019   SCAD with aneurysmal dilation of proximal LAD.   Vaginal Pap smear, abnormal    when she was 36yo   Ventricular fibrillation (HCC) 09/23/2019   Past Surgical History:  Procedure Laterality Date   CARDIAC CATHETERIZATION     IR GASTROSTOMY TUBE MOD SED  10/07/2019   IR GASTROSTOMY TUBE REMOVAL  07/02/2020   LEFT HEART CATH AND CORONARY ANGIOGRAPHY N/A 09/23/2019   Procedure: LEFT HEART CATH AND CORONARY ANGIOGRAPHY;  Surgeon: Yvonne Kendall, MD;  Location: [facility] INVASIVE CV LAB;  Service: Cardiovascular;  Laterality: N/A;   SUBQ ICD IMPLANT N/A 11/26/2020   Procedure: SUBQ ICD IMPLANT;  Surgeon: Lanier Prude, MD;  Location: [facility] INVASIVE CV LAB;  Service: Cardiovascular;  Laterality: N/A;   Patient Active Problem List   Diagnosis Date Noted   S/P ICD (internal cardiac defibrillator) procedure 11/27/2020   Hx of Cardiac arrest (HCC) 11/26/2020   Abnormality of gait 03/16/2020   Sleep disturbance 03/16/2020   Oropharyngeal dysphagia 02/12/2020    Coronary artery disease involving native coronary artery of native heart without angina pectoris 12/12/2019   Ischemic cardiomyopathy 12/12/2019   Brain injury (HCC)    Prediabetes    Anoxic brain injury (HCC) 11/05/2019   Dysphagia 11/05/2019   Physical deconditioning 11/05/2019   Acute delirium 11/05/2019   Respiratory failure (HCC)    Encounter for central line placement    Ventricular fibrillation (HCC) 09/23/2019   Acute combined systolic and diastolic heart failure (HCC) 09/23/2019   Ventricular tachycardia, polymorphic (HCC) 09/23/2019   Pelvic pain affecting pregnancy 10/15/2015   Supervision of normal pregnancy in third trimester 09/28/2015   Poor weight gain of pregnancy 09/28/2015   Iron deficiency anemia of pregnancy 09/01/2015   Increased BMI (body mass index) 07/29/2015    REFERRING DIAG: G93.1 (ICD-10-CM) - Anoxic brain injury (HCC)  THERAPY DIAG:  Abnormality of gait and mobility  Difficulty in walking, not elsewhere classified  Unsteadiness on feet  Rationale for Evaluation and Treatment Rehabilitation  PERTINENT HISTORY: Patient is a 36 year old with diagnois of anoxic brain injury. She was receiving outpatient PT until Feb. 2022 last year but ran out of visits. Patient has past medical history of Anoxic Brain Injury 09/2019, CAD, HLD, HTN, ISCHEMIC CARDIMYOPATHY, STEMI, V-FIB, ICD on 11/29/2020  .On 09/23/19 patient presented to ED 2 weeks postpartum (had emergent C-Section) with chest pain.  In ED sustained VFib Cardiac Arrest requiring intubation. EKG showed ST elevation, Cath Lab showed aneurysmal dissection of the LAD was identified. Course complicated by encephalopathy, anoxic brain injury, enterobacterial PNA. Patient is currently living with mom. Mother reports she is abel to walk some with a walker with supervision but primarily ambulates with HHA of mother who is her guardian. Mother also reports she requires assist for all transfers and ADL's.   PRECAUTIONS:  Fall  SUBJECTIVE: Patient presents with mom. Mom is practicing her HEP with patient at home but is challenging as she also takes care of her kids.   PAIN:  Are you having pain? No     TODAY'S TREATMENT:  08/04/21 The beginning were performed in private treatment room to minimize distractions from environment   Seated #2.5 ankle weight: -march 10x; 2 sets  -LAQ kicking soccer ball 10x each LE   Standing:  #2.5 ankle weight: -march 12x each LE cues for staying in one place -lateral stepping length of bar 4x each side -heel raise 10x; cue for sequencing  Standing sorting colors on board x3 minutes  Seated grip/finger with putty: see HEP  Seated hand clap for coordination x 3 minutes (added to HEP)    PATIENT EDUCATION: Education details: HEP Person educated: Cytogeneticist: Programmer, multimedia, Facilities manager, Actor cues, and Verbal cues Education comprehension: returned demonstration and needs further education   HOME EXERCISE PROGRAM: Access Code: [facility] URL: [email or web address] Date: 07/28/2021 Prepared by: Thresa Ross  Exercises - Seated Long Arc Quad  - 1 x daily - 7 x weekly - 2 sets - 10 reps -Sit to stand- 1 x daily - 7 x weekly - 2 sets - 10 reps  Marching: 1 x daily - 7 x weekly - 2 sets - 10 reps   Access Code: [email or web address] URL: [email or web address] Date: 08/04/2021 Prepared by: Precious Bard  Exercises - Key Pinch with Putty  - 1 x daily - 7 x weekly - 2 sets - 10 reps - 5 hold - Hand Squeezes  - 1 x daily - 7 x weekly - 2 sets - 10 reps - 5 hold - Tip Pinch with Putty  - 1 x daily - 7 x weekly - 2 sets - 10 reps - 5 hold   PT Short Term Goals -       PT SHORT TERM GOAL #1   Title Patient will perform all transfers with Stand by assist using least restrictive AD,  BUE Support, and reaching back for safety to improve her functional independence and decrease fall risk.    Baseline 07/21/21: requires Min A and  significant UE utilizaiton    Time 6    Period Weeks    Status Unable to assess              PT Long Term Goals       PT LONG TERM GOAL #1   Title Patient/caregiver will report indepence with HEP with caregiver assistance.    Baseline 6/15: not copmelting HEP regularly/ father unsure of home program/ activities    Time 8    Period Weeks    Status Unable to assess    Target Date 09/15/21      PT LONG TERM GOAL #2   Title Pt will decrease 5XSTS with UE use as needed by at least  15 seconds in order to demonstrate clinically significant improvement in LE strength.    Baseline 6/15: 61.54 sec with UE A    Time 12  Period Weeks    Status Unable to assess    Target Date 10/13/21      PT LONG TERM GOAL #3   Title Pt will decrease TUG to below 34 seconds/decrease in order to demonstrate decreased fall risk.    Baseline 03/17/2021= 43.49 sec using RW    Time 12    Period Weeks    Status New    Target Date 10/13/21      PT LONG TERM GOAL #4   Title Patient will demo ability to ambulate > 400 ft w/ LRAD versus hand held assist min assist to demonstrate improved household mobility and short community distances.    Baseline 03/17/2021= 125 feet with Min A and RW and cues for proximity to walker    Time 12    Period Weeks    Status New    Target Date 10/13/21      PT LONG TERM GOAL #5   Title Pt will improve FOTO to target score to display perceived improvements in ability to complete ADL's.    Baseline 6/15: no assessed secondary to father not knowing answers to questions, ptsent home with QR code for mother to complete survey at home.    Time 12    Period Weeks    Status New    Target Date 10/13/21      PT LONG TERM GOAL #6   Title Pt will increase by at least 0.15 m/s in order to demonstrate clinically significant improvement in community ambulation.    Baseline 03/17/2021= 0.24 m/s using RW and min A from PT for walker management    Time 12    Period Weeks    Status  New    Target Date 10/13/21              Plan -     Clinical Impression Statement Patient is challenged with maintaining orientation to task requiring multimodal cueing due to distraction. She does well with visual and verbal cues. Education on use of putty performed with HEP given to mother.  Patient will continue to benefit from skilled physical therapy to improve her lower extremity strength, ambulatory capacity, improve her ability to transfer, and prevent falls and improve quality of life.   Personal Factors and Comorbidities Behavior Pattern;Transportation;Comorbidity 3+    Comorbidities HTN, CAD, HLD    Examination-Activity Limitations Bed Mobility;Dressing;Hygiene/Grooming;Caring for Others;Stairs;Stand;Toileting;Transfers;Sit;Bathing;Continence;Lift;Squat;Reach Overhead    Examination-Participation Restrictions Occupation;Medication Management;Cleaning;Yard Work;Personal Finances;Driving    Stability/Clinical Decision Making Evolving/Moderate complexity    Rehab Potential Good    PT Frequency 1x / week    PT Duration 12 weeks    PT Treatment/Interventions ADLs/Self Care Home Management;Aquatic Therapy;Electrical Stimulation;DME Instruction;Gait training;Stair training;Functional mobility training;Therapeutic activities;Therapeutic exercise;Balance training;Neuromuscular re-education;Wheelchair mobility training;Patient/family education;Orthotic Fit/Training;Manual techniques;Passive range of motion;Cryotherapy;Moist Heat    PT Next Visit Plan Discharge today and plan to transition to [facility] services    PT Home Exercise Plan Access Code: [email or web address]    Consulted and Agree with Plan of Care Patient;Family member/caregiver    Family Member Consulted Mom               [place], [place] 08/04/2021, 2:26 PM

## 2021-08-12 ENCOUNTER — Other Ambulatory Visit: Payer: Self-pay | Admitting: Internal Medicine

## 2021-08-16 ENCOUNTER — Encounter: Payer: Self-pay | Admitting: Cardiology

## 2021-08-25 ENCOUNTER — Ambulatory Visit: Payer: Medicaid Other | Admitting: Physical Therapy

## 2021-08-29 ENCOUNTER — Telehealth: Payer: Self-pay

## 2021-08-29 ENCOUNTER — Other Ambulatory Visit: Payer: Self-pay | Admitting: Physical Medicine & Rehabilitation

## 2021-08-29 ENCOUNTER — Other Ambulatory Visit: Payer: Self-pay | Admitting: Internal Medicine

## 2021-08-29 DIAGNOSIS — G479 Sleep disorder, unspecified: Secondary | ICD-10-CM

## 2021-08-29 NOTE — Telephone Encounter (Signed)
Patient's mother called and stated they misplaced the Trazodone in the process of moving. She wants to know if it can be refilled. Please advise

## 2021-08-30 MED ORDER — TRAZODONE HCL 50 MG PO TABS: 50.0000 mg | ORAL_TABLET | Freq: Every day | ORAL | 2 refills | Status: DC

## 2021-08-30 NOTE — Telephone Encounter (Signed)
Rx written and sent to the pharmacy. Thanks!  

## 2021-08-30 NOTE — Addendum Note (Signed)
Addended by: Faith Rogue T on: 08/30/2021 01:32 PM   Modules accepted: Orders

## 2021-09-01 ENCOUNTER — Ambulatory Visit: Payer: Medicaid Other | Attending: Neurology

## 2021-09-01 DIAGNOSIS — M6281 Muscle weakness (generalized): Secondary | ICD-10-CM | POA: Insufficient documentation

## 2021-09-01 DIAGNOSIS — R2689 Other abnormalities of gait and mobility: Secondary | ICD-10-CM | POA: Diagnosis present

## 2021-09-01 DIAGNOSIS — R262 Difficulty in walking, not elsewhere classified: Secondary | ICD-10-CM | POA: Insufficient documentation

## 2021-09-01 DIAGNOSIS — R269 Unspecified abnormalities of gait and mobility: Secondary | ICD-10-CM | POA: Insufficient documentation

## 2021-09-01 DIAGNOSIS — R278 Other lack of coordination: Secondary | ICD-10-CM | POA: Insufficient documentation

## 2021-09-01 DIAGNOSIS — R2681 Unsteadiness on feet: Secondary | ICD-10-CM | POA: Diagnosis present

## 2021-09-01 DIAGNOSIS — G931 Anoxic brain damage, not elsewhere classified: Secondary | ICD-10-CM | POA: Insufficient documentation

## 2021-09-01 DIAGNOSIS — R41841 Cognitive communication deficit: Secondary | ICD-10-CM | POA: Insufficient documentation

## 2021-09-02 NOTE — Therapy (Signed)
OUTPATIENT PHYSICAL THERAPY TREATMENT NOTE   Patient Name: Beverly [name] MRN: [id number] DOB:1985/08/30, 36 y.o., female Today's Date: 09/02/2021  PCP: Delman Cheadle, PA REFERRING PROVIDER: Ranelle Oyster, MD   PT End of Session - 09/01/21 1140     Visit Number 4    Number of Visits 12    Date for PT Re-Evaluation 10/13/21    Authorization Type Medicaid approval- 27 visits for OT/PT/SLP combined    Progress Note Due on Visit 10    PT Start Time 1601    PT Stop Time 1645    PT Time Calculation (min) 44 min    Equipment Utilized During Treatment Gait belt    Activity Tolerance Patient tolerated treatment well    Behavior During Therapy Impulsive              Past Medical History:  Diagnosis Date   Brain injury (HCC)    CAD (coronary artery disease)    HLD (hyperlipidemia)    Hypertension    last pregnancy   Ischemic cardiomyopathy    STEMI (ST elevation myocardial infarction) (HCC) 09/2019   SCAD with aneurysmal dilation of proximal LAD.   Vaginal Pap smear, abnormal    when she was 36yo   Ventricular fibrillation (HCC) 09/23/2019   Past Surgical History:  Procedure Laterality Date   CARDIAC CATHETERIZATION     IR GASTROSTOMY TUBE MOD SED  10/07/2019   IR GASTROSTOMY TUBE REMOVAL  07/02/2020   LEFT HEART CATH AND CORONARY ANGIOGRAPHY N/A 09/23/2019   Procedure: LEFT HEART CATH AND CORONARY ANGIOGRAPHY;  Surgeon: Yvonne Kendall, MD;  Location: [facility] INVASIVE CV LAB;  Service: Cardiovascular;  Laterality: N/A;   SUBQ ICD IMPLANT N/A 11/26/2020   Procedure: SUBQ ICD IMPLANT;  Surgeon: Lanier Prude, MD;  Location: [facility] INVASIVE CV LAB;  Service: Cardiovascular;  Laterality: N/A;   Patient Active Problem List   Diagnosis Date Noted   S/P ICD (internal cardiac defibrillator) procedure 11/27/2020   Hx of Cardiac arrest (HCC) 11/26/2020   Abnormality of gait 03/16/2020   Sleep disturbance 03/16/2020   Oropharyngeal dysphagia 02/12/2020    Coronary artery disease involving native coronary artery of native heart without angina pectoris 12/12/2019   Ischemic cardiomyopathy 12/12/2019   Brain injury (HCC)    Prediabetes    Anoxic brain injury (HCC) 11/05/2019   Dysphagia 11/05/2019   Physical deconditioning 11/05/2019   Acute delirium 11/05/2019   Respiratory failure (HCC)    Encounter for central line placement    Ventricular fibrillation (HCC) 09/23/2019   Acute combined systolic and diastolic heart failure (HCC) 09/23/2019   Ventricular tachycardia, polymorphic (HCC) 09/23/2019   Pelvic pain affecting pregnancy 10/15/2015   Supervision of normal pregnancy in third trimester 09/28/2015   Poor weight gain of pregnancy 09/28/2015   Iron deficiency anemia of pregnancy 09/01/2015   Increased BMI (body mass index) 07/29/2015    REFERRING DIAG: G93.1 (ICD-10-CM) - Anoxic brain injury (HCC)  THERAPY DIAG:  Abnormality of gait and mobility  Difficulty in walking, not elsewhere classified  Unsteadiness on feet  Other abnormalities of gait and mobility  Anoxic brain injury (HCC)  Muscle weakness (generalized)  Other lack of coordination  Cognitive communication deficit  Rationale for Evaluation and Treatment Rehabilitation  PERTINENT HISTORY: Patient is a 36 year old with diagnois of anoxic brain injury. She was receiving outpatient PT until Feb. 2022 last year but ran out of visits. Patient has past medical history of Anoxic Brain Injury 09/2019, CAD,  HLD, HTN, ISCHEMIC CARDIMYOPATHY, STEMI, V-FIB, ICD on 11/29/2020  .On 09/23/19 patient presented to ED 2 weeks postpartum (had emergent C-Section) with chest pain. In ED sustained VFib Cardiac Arrest requiring intubation. EKG showed ST elevation, Cath Lab showed aneurysmal dissection of the LAD was identified. Course complicated by encephalopathy, anoxic brain injury, enterobacterial PNA. Patient is currently living with mom. Mother reports she is abel to walk some with a  walker with supervision but primarily ambulates with HHA of mother who is her guardian. Mother also reports she requires assist for all transfers and ADL's.   PRECAUTIONS: Fall  SUBJECTIVE: Patient mom reports the patient has been doing well. She states she is walking some at home with use of walker and some with HHA. Mother reports she has even been going up/down steps with railing with assistance.   PAIN:  Are you having pain? No     TODAY'S TREATMENT:  09/02/21 The beginning were performed in private treatment room to minimize distractions from environment   Seated 3# ankle weight: -march (active to [facility] for correct technique) 2 sets of 10 reps -LAQ (max VC and visual demo to perform correctly) x 15 reps each LE   Standing: (PT performed exercises beside patient maintaining CGA)   -march 12x each LE cues for staying in one place (B UE on support bar)  -lateral stepping length of bar 4x each side -heel raise 10x; cue for sequencing   -Walking with RW, Gait belt, CGA x 80 feet x 2 trials- Min assist with turning and navigating to avoid objects in path. Patient ambulated with short reciprocal steps    -Sit to stand with focus on sitting safe including reaching back for armrest x 12 reps and patient with approx 50% compliant with reaching back with UE.   PATIENT EDUCATION: Education details: HEP Person educated: Cytogeneticist: Programmer, multimedia, Facilities manager, Actor cues, and Verbal cues Education comprehension: returned demonstration and needs further education   HOME EXERCISE PROGRAM: Access Code: [facility] URL: [email or web address] Date: 07/28/2021 Prepared by: Thresa Ross  Exercises - Seated Long Arc Quad  - 1 x daily - 7 x weekly - 2 sets - 10 reps -Sit to stand- 1 x daily - 7 x weekly - 2 sets - 10 reps  Marching: 1 x daily - 7 x weekly - 2 sets - 10 reps   Access Code: [email or web address] URL: [email or web address] Date:  08/04/2021 Prepared by: Precious Bard  Exercises - Key Pinch with Putty  - 1 x daily - 7 x weekly - 2 sets - 10 reps - 5 hold - Hand Squeezes  - 1 x daily - 7 x weekly - 2 sets - 10 reps - 5 hold - Tip Pinch with Putty  - 1 x daily - 7 x weekly - 2 sets - 10 reps - 5 hold   PT Short Term Goals -       PT SHORT TERM GOAL #1   Title Patient will perform all transfers with Stand by assist using least restrictive AD,  BUE Support, and reaching back for safety to improve her functional independence and decrease fall risk.    Baseline 07/21/21: requires Min A and significant UE utilizaiton    Time 6    Period Weeks    Status Unable to assess              PT Long Term Goals       PT LONG TERM GOAL #1   Title Patient/caregiver will report  indepence with HEP with caregiver assistance.    Baseline 6/15: not copmelting HEP regularly/ father unsure of home program/ activities    Time 8    Period Weeks    Status Unable to assess    Target Date 09/15/21      PT LONG TERM GOAL #2   Title Pt will decrease 5XSTS with UE use as needed by at least  15 seconds in order to demonstrate clinically significant improvement in LE strength.    Baseline 6/15: 61.54 sec with UE A    Time 12    Period Weeks    Status Unable to assess    Target Date 10/13/21      PT LONG TERM GOAL #3   Title Pt will decrease TUG to below 34 seconds/decrease in order to demonstrate decreased fall risk.    Baseline 03/17/2021= 43.49 sec using RW    Time 12    Period Weeks    Status New    Target Date 10/13/21      PT LONG TERM GOAL #4   Title Patient will demo ability to ambulate > 400 ft w/ LRAD versus hand held assist min assist to demonstrate improved household mobility and short community distances.    Baseline 03/17/2021= 125 feet with Min A and RW and cues for proximity to walker    Time 12    Period Weeks    Status New    Target Date 10/13/21      PT LONG TERM GOAL #5   Title Pt will improve FOTO to  target score to display perceived improvements in ability to complete ADL's.    Baseline 6/15: no assessed secondary to father not knowing answers to questions, ptsent home with QR code for mother to complete survey at home.    Time 12    Period Weeks    Status New    Target Date 10/13/21      PT LONG TERM GOAL #6   Title Pt will increase by at least 0.15 m/s in order to demonstrate clinically significant improvement in community ambulation.    Baseline 03/17/2021= 0.24 m/s using RW and min A from PT for walker management    Time 12    Period Weeks    Status New    Target Date 10/13/21              Plan -     Clinical Impression Statement Patient continues to be challenged with maintaining orientation to task requiring multimodal cueing due to distraction. She responded well to persistent VC and visual demonstration. She was also able to demo improved transfer ability regarding safety with practice today.  Patient will continue to benefit from skilled physical therapy to improve her lower extremity strength, ambulatory capacity, improve her ability to transfer, and prevent falls and improve quality of life.   Personal Factors and Comorbidities Behavior Pattern;Transportation;Comorbidity 3+    Comorbidities HTN, CAD, HLD    Examination-Activity Limitations Bed Mobility;Dressing;Hygiene/Grooming;Caring for Others;Stairs;Stand;Toileting;Transfers;Sit;Bathing;Continence;Lift;Squat;Reach Overhead    Examination-Participation Restrictions Occupation;Medication Management;Cleaning;Yard Work;Personal Finances;Driving    Stability/Clinical Decision Making Evolving/Moderate complexity    Rehab Potential Good    PT Frequency 1x / week    PT Duration 12 weeks    PT Treatment/Interventions ADLs/Self Care Home Management;Aquatic Therapy;Electrical Stimulation;DME Instruction;Gait training;Stair training;Functional mobility training;Therapeutic activities;Therapeutic exercise;Balance  training;Neuromuscular re-education;Wheelchair mobility training;Patient/family education;Orthotic Fit/Training;Manual techniques;Passive range of motion;Cryotherapy;Moist Heat    PT Next Visit Plan Discharge today and plan to transition to [facility]  services    PT Home Exercise Plan Access Code: [email or web address]    Consulted and Agree with Plan of Care Patient;Family member/caregiver    Family Member Consulted Mom               Lenda Kelp, PT 09/02/2021, 11:44 AM

## 2021-09-05 ENCOUNTER — Other Ambulatory Visit: Payer: Self-pay | Admitting: Physical Medicine & Rehabilitation

## 2021-09-05 DIAGNOSIS — G931 Anoxic brain damage, not elsewhere classified: Secondary | ICD-10-CM

## 2021-09-05 DIAGNOSIS — R1312 Dysphagia, oropharyngeal phase: Secondary | ICD-10-CM

## 2021-09-06 MED ORDER — METHYLPHENIDATE HCL 10 MG PO TABS: 15.0000 mg | ORAL_TABLET | Freq: Two times a day (BID) | ORAL | 0 refills | Status: DC

## 2021-09-08 ENCOUNTER — Ambulatory Visit: Payer: Medicaid Other | Admitting: Physical Therapy

## 2021-09-15 ENCOUNTER — Ambulatory Visit (INDEPENDENT_AMBULATORY_CARE_PROVIDER_SITE_OTHER): Payer: Medicaid Other

## 2021-09-15 ENCOUNTER — Ambulatory Visit: Payer: Medicaid Other | Attending: Physical Medicine & Rehabilitation | Admitting: Physical Therapy

## 2021-09-15 DIAGNOSIS — R262 Difficulty in walking, not elsewhere classified: Secondary | ICD-10-CM | POA: Insufficient documentation

## 2021-09-15 DIAGNOSIS — R269 Unspecified abnormalities of gait and mobility: Secondary | ICD-10-CM | POA: Insufficient documentation

## 2021-09-15 DIAGNOSIS — R2689 Other abnormalities of gait and mobility: Secondary | ICD-10-CM | POA: Diagnosis present

## 2021-09-15 DIAGNOSIS — R2681 Unsteadiness on feet: Secondary | ICD-10-CM | POA: Insufficient documentation

## 2021-09-15 DIAGNOSIS — I5042 Chronic combined systolic (congestive) and diastolic (congestive) heart failure: Secondary | ICD-10-CM

## 2021-09-15 LAB — CUP PACEART REMOTE DEVICE CHECK
Battery Remaining Percentage: 90 %
Date Time Interrogation Session: 20230809144500
Implantable Pulse Generator Implant Date: 20221021
Pulse Gen Serial Number: 161805

## 2021-09-15 NOTE — Therapy (Signed)
OUTPATIENT PHYSICAL THERAPY TREATMENT NOTE   Patient Name: Beverly [name] MRN: [id number] DOB:24-Oct-1985, 36 y.o., female Today's Date: 09/15/2021  PCP: Delman Cheadle, PA REFERRING PROVIDER: Ranelle Oyster, MD   PT End of Session - 09/15/21 1606     Visit Number 5    Number of Visits 12    Date for PT Re-Evaluation 10/13/21    Authorization Type Medicaid approval- 27 visits for OT/PT/SLP combined    Progress Note Due on Visit 10    PT Start Time 1603    PT Stop Time 1645    PT Time Calculation (min) 42 min    Equipment Utilized During Treatment Gait belt    Activity Tolerance Patient tolerated treatment well    Behavior During Therapy Impulsive               Past Medical History:  Diagnosis Date   Brain injury (HCC)    CAD (coronary artery disease)    HLD (hyperlipidemia)    Hypertension    last pregnancy   Ischemic cardiomyopathy    STEMI (ST elevation myocardial infarction) (HCC) 09/2019   SCAD with aneurysmal dilation of proximal LAD.   Vaginal Pap smear, abnormal    when she was 36yo   Ventricular fibrillation (HCC) 09/23/2019   Past Surgical History:  Procedure Laterality Date   CARDIAC CATHETERIZATION     IR GASTROSTOMY TUBE MOD SED  10/07/2019   IR GASTROSTOMY TUBE REMOVAL  07/02/2020   LEFT HEART CATH AND CORONARY ANGIOGRAPHY N/A 09/23/2019   Procedure: LEFT HEART CATH AND CORONARY ANGIOGRAPHY;  Surgeon: Yvonne Kendall, MD;  Location: [facility] INVASIVE CV LAB;  Service: Cardiovascular;  Laterality: N/A;   SUBQ ICD IMPLANT N/A 11/26/2020   Procedure: SUBQ ICD IMPLANT;  Surgeon: Lanier Prude, MD;  Location: [facility] INVASIVE CV LAB;  Service: Cardiovascular;  Laterality: N/A;   Patient Active Problem List   Diagnosis Date Noted   S/P ICD (internal cardiac defibrillator) procedure 11/27/2020   Hx of Cardiac arrest (HCC) 11/26/2020   Abnormality of gait 03/16/2020   Sleep disturbance 03/16/2020   Oropharyngeal dysphagia 02/12/2020    Coronary artery disease involving native coronary artery of native heart without angina pectoris 12/12/2019   Ischemic cardiomyopathy 12/12/2019   Brain injury (HCC)    Prediabetes    Anoxic brain injury (HCC) 11/05/2019   Dysphagia 11/05/2019   Physical deconditioning 11/05/2019   Acute delirium 11/05/2019   Respiratory failure (HCC)    Encounter for central line placement    Ventricular fibrillation (HCC) 09/23/2019   Acute combined systolic and diastolic heart failure (HCC) 09/23/2019   Ventricular tachycardia, polymorphic (HCC) 09/23/2019   Pelvic pain affecting pregnancy 10/15/2015   Supervision of normal pregnancy in third trimester 09/28/2015   Poor weight gain of pregnancy 09/28/2015   Iron deficiency anemia of pregnancy 09/01/2015   Increased BMI (body mass index) 07/29/2015    REFERRING DIAG: G93.1 (ICD-10-CM) - Anoxic brain injury (HCC)  THERAPY DIAG:  Abnormality of gait and mobility  Difficulty in walking, not elsewhere classified  Unsteadiness on feet  Other abnormalities of gait and mobility  Rationale for Evaluation and Treatment Rehabilitation  PERTINENT HISTORY: Patient is a 36 year old with diagnois of anoxic brain injury. She was receiving outpatient PT until Feb. 2022 last year but ran out of visits. Patient has past medical history of Anoxic Brain Injury 09/2019, CAD, HLD, HTN, ISCHEMIC CARDIMYOPATHY, STEMI, V-FIB, ICD on 11/29/2020  .On 09/23/19 patient presented to ED 2  weeks postpartum (had emergent C-Section) with chest pain. In ED sustained VFib Cardiac Arrest requiring intubation. EKG showed ST elevation, Cath Lab showed aneurysmal dissection of the LAD was identified. Course complicated by encephalopathy, anoxic brain injury, enterobacterial PNA. Patient is currently living with mom. Mother reports she is abel to walk some with a walker with supervision but primarily ambulates with HHA of mother who is her guardian. Mother also reports she requires assist  for all transfers and ADL's.   PRECAUTIONS: Fall  SUBJECTIVE: Patient presents with mom. Mom is practicing her HEP with patient at home but is challenging as she also takes care of her kids.   PAIN:  Are you having pain? No     TODAY'S TREATMENT:  09/15/21 The beginning were performed in private treatment room to minimize distractions from environment   Seated #2.5 ankle weight: -march 10x; 2 sets ( difficulty with alternating pattern, used step for marching goal)  -LAQ  10x each LE   Standing:  -anterior step taps 12x each LE cues for staying in one place  #2.5 ankle weight: -lateral stepping length of bar 4x each side, goo dability at start but decreased quality of sidestepping with fatigue/ reps ( unclear if fatigue is limiting or patient loses interest in activity)  -heel raise 10x;   Standing sorting colors on board x5 minutes -difficulty with UE control, U UE assist   Ambulation: no AD, Min guard assist mother followed with wheelchair, patient ambulates 35 feet with no loss of balance but did have short step length and some abducted gait pattern at times.  Pt required occasional rest breaks due fatigue, PT was quick to ask when pt appeared to be fatiguing in order to prevent excessive fatigue.  PATIENT EDUCATION: Education details: HEP Person educated: Cytogeneticist: Programmer, multimedia, Facilities manager, Actor cues, and Verbal cues Education comprehension: returned demonstration and needs further education   HOME EXERCISE PROGRAM: Access Code: [facility] URL: [email or web address] Date: 07/28/2021 Prepared by: Thresa Ross  Exercises - Seated Long Arc Quad  - 1 x daily - 7 x weekly - 2 sets - 10 reps -Sit to stand- 1 x daily - 7 x weekly - 2 sets - 10 reps  Marching: 1 x daily - 7 x weekly - 2 sets - 10 reps   Access Code: [email or web address] URL: [email or web address] Date: 08/04/2021 Prepared by: Precious Bard  Exercises - Key  Pinch with Putty  - 1 x daily - 7 x weekly - 2 sets - 10 reps - 5 hold - Hand Squeezes  - 1 x daily - 7 x weekly - 2 sets - 10 reps - 5 hold - Tip Pinch with Putty  - 1 x daily - 7 x weekly - 2 sets - 10 reps - 5 hold   PT Short Term Goals -       PT SHORT TERM GOAL #1   Title Patient will perform all transfers with Stand by assist using least restrictive AD,  BUE Support, and reaching back for safety to improve her functional independence and decrease fall risk.    Baseline 07/21/21: requires Min A and significant UE utilizaiton    Time 6    Period Weeks    Status Unable to assess              PT Long Term Goals       PT LONG TERM GOAL #1   Title Patient/caregiver will report indepence with HEP with caregiver assistance.    Baseline  6/15: not copmelting HEP regularly/ father unsure of home program/ activities    Time 8    Period Weeks    Status Unable to assess    Target Date 09/15/21      PT LONG TERM GOAL #2   Title Pt will decrease 5XSTS with UE use as needed by at least  15 seconds in order to demonstrate clinically significant improvement in LE strength.    Baseline 6/15: 61.54 sec with UE A    Time 12    Period Weeks    Status Unable to assess    Target Date 10/13/21      PT LONG TERM GOAL #3   Title Pt will decrease TUG to below 34 seconds/decrease in order to demonstrate decreased fall risk.    Baseline 03/17/2021= 43.49 sec using RW    Time 12    Period Weeks    Status New    Target Date 10/13/21      PT LONG TERM GOAL #4   Title Patient will demo ability to ambulate > 400 ft w/ LRAD versus hand held assist min assist to demonstrate improved household mobility and short community distances.    Baseline 03/17/2021= 125 feet with Min A and RW and cues for proximity to walker    Time 12    Period Weeks    Status New    Target Date 10/13/21      PT LONG TERM GOAL #5   Title Pt will improve FOTO to target score to display perceived improvements in ability to  complete ADL's.    Baseline 6/15: no assessed secondary to father not knowing answers to questions, ptsent home with QR code for mother to complete survey at home.    Time 12    Period Weeks    Status New    Target Date 10/13/21      PT LONG TERM GOAL #6   Title Pt will increase 10MWT by at least 0.15 m/s in order to demonstrate clinically significant improvement in community ambulation.    Baseline 03/17/2021= 0.24 m/s using RW and min A from PT for walker management    Time 12    Period Weeks    Status New    Target Date 10/13/21              Plan -     Clinical Impression Statement Patient is challenged with maintaining orientation to task requiring very frequent multimodal cueing due to distraction. She does well with visual and verbal cues but does fatigue easily and is distracted with the environment.  Patient was able to ambulate successfully this session without upper extremity assist but may benefit for ambulation and quieter environment in future sessions if possible.  Is difficult to tell whether patient fatigues or is distracted or mixture of both with several exercises.  Fatigue was clearly noted in standing heel raises as patient demonstrated compensatory patterns she continued to attempt.  Patient will continue to benefit from skilled physical therapy to improve her lower extremity strength, ambulatory capacity, improve her ability to transfer, and prevent falls and improve quality of life.   Personal Factors and Comorbidities Behavior Pattern;Transportation;Comorbidity 3+    Comorbidities HTN, CAD, HLD    Examination-Activity Limitations Bed Mobility;Dressing;Hygiene/Grooming;Caring for Others;Stairs;Stand;Toileting;Transfers;Sit;Bathing;Continence;Lift;Squat;Reach Overhead    Examination-Participation Restrictions Occupation;Medication Management;Cleaning;Yard Work;Personal Finances;Driving    Stability/Clinical Decision Making Evolving/Moderate complexity    Rehab  Potential Good    PT Frequency 1x / week    PT  Duration 12 weeks    PT Treatment/Interventions ADLs/Self Care Home Management;Aquatic Therapy;Electrical Stimulation;DME Instruction;Gait training;Stair training;Functional mobility training;Therapeutic activities;Therapeutic exercise;Balance training;Neuromuscular re-education;Wheelchair mobility training;Patient/family education;Orthotic Fit/Training;Manual techniques;Passive range of motion;Cryotherapy;Moist Heat    PT Next Visit Plan Discharge today and plan to transition to [facility] services    PT Home Exercise Plan Access Code: [email or web address]    Consulted and Agree with Plan of Care Patient;Family member/caregiver    Family Member Consulted Mom               Particia Lather, [place] 09/15/2021, 4:07 PM

## 2021-09-19 ENCOUNTER — Telehealth: Payer: Self-pay

## 2021-09-19 NOTE — Telephone Encounter (Signed)
Alert [employer]   Device alert Shock therapy delivered to convert arrhythmia (treated episode). Event occurred 8/12 @ 20:48, EGM shows ?NSVT vs oversensing of artifact, shock x1  Spoke with patients mother she stated she was taking patient to bathroom around that time, and patient grabbed her chest and acted like she was in pain, but patient recovered, per [facility] ECG looks like noise, Joey [employer] scientific rep recommended bringing patient in to reprogram vectors as it appear to be noise, patients mother agreeable to bring patient in to device clinic 09/20/21 at 11:30.

## 2021-09-20 ENCOUNTER — Ambulatory Visit (INDEPENDENT_AMBULATORY_CARE_PROVIDER_SITE_OTHER): Payer: Medicaid Other

## 2021-09-20 DIAGNOSIS — I255 Ischemic cardiomyopathy: Secondary | ICD-10-CM | POA: Diagnosis not present

## 2021-09-20 LAB — CUP PACEART INCLINIC DEVICE CHECK
Date Time Interrogation Session: 20230815152824
Implantable Pulse Generator Implant Date: 20221021
Pulse Gen Serial Number: 161805

## 2021-09-20 NOTE — Progress Notes (Signed)
Patient seen in device clinic with industry present. changes made by Joey, BS Rep to S-ICD due to inappropriate ICD shock. Isometrics were performed and noise noted per Joey. See scanned media.   Changes to session: SMART Charge programmed from 0S to 4.8S Setting Configuration programmed from primary to alternate

## 2021-09-22 ENCOUNTER — Encounter: Payer: Self-pay | Admitting: Physical Therapy

## 2021-09-22 ENCOUNTER — Ambulatory Visit: Payer: Medicaid Other | Admitting: Physical Therapy

## 2021-09-22 DIAGNOSIS — R2689 Other abnormalities of gait and mobility: Secondary | ICD-10-CM

## 2021-09-22 DIAGNOSIS — R269 Unspecified abnormalities of gait and mobility: Secondary | ICD-10-CM

## 2021-09-22 DIAGNOSIS — R262 Difficulty in walking, not elsewhere classified: Secondary | ICD-10-CM

## 2021-09-22 DIAGNOSIS — R2681 Unsteadiness on feet: Secondary | ICD-10-CM

## 2021-09-22 NOTE — Therapy (Signed)
OUTPATIENT PHYSICAL THERAPY TREATMENT NOTE   Patient Name: Beverly [name] MRN: [id number] DOB:January 10, 1986, 36 y.o., female Today's Date: 09/22/2021  PCP: Delman Cheadle, PA REFERRING PROVIDER: Ranelle Oyster, MD   PT End of Session - 09/22/21 1433     Visit Number 6    Number of Visits 12    Date for PT Re-Evaluation 10/13/21    Authorization Type Medicaid approval- 27 visits for OT/PT/SLP combined    Progress Note Due on Visit 10    PT Start Time 1433    PT Stop Time 1513    PT Time Calculation (min) 40 min    Equipment Utilized During Treatment Gait belt    Activity Tolerance Patient tolerated treatment well    Behavior During Therapy Impulsive                Past Medical History:  Diagnosis Date   Brain injury (HCC)    CAD (coronary artery disease)    HLD (hyperlipidemia)    Hypertension    last pregnancy   Ischemic cardiomyopathy    STEMI (ST elevation myocardial infarction) (HCC) 09/2019   SCAD with aneurysmal dilation of proximal LAD.   Vaginal Pap smear, abnormal    when she was 36yo   Ventricular fibrillation (HCC) 09/23/2019   Past Surgical History:  Procedure Laterality Date   CARDIAC CATHETERIZATION     IR GASTROSTOMY TUBE MOD SED  10/07/2019   IR GASTROSTOMY TUBE REMOVAL  07/02/2020   LEFT HEART CATH AND CORONARY ANGIOGRAPHY N/A 09/23/2019   Procedure: LEFT HEART CATH AND CORONARY ANGIOGRAPHY;  Surgeon: Yvonne Kendall, MD;  Location: [facility] INVASIVE CV LAB;  Service: Cardiovascular;  Laterality: N/A;   SUBQ ICD IMPLANT N/A 11/26/2020   Procedure: SUBQ ICD IMPLANT;  Surgeon: Lanier Prude, MD;  Location: [facility] INVASIVE CV LAB;  Service: Cardiovascular;  Laterality: N/A;   Patient Active Problem List   Diagnosis Date Noted   S/P ICD (internal cardiac defibrillator) procedure 11/27/2020   Hx of Cardiac arrest (HCC) 11/26/2020   Abnormality of gait 03/16/2020   Sleep disturbance 03/16/2020   Oropharyngeal dysphagia 02/12/2020    Coronary artery disease involving native coronary artery of native heart without angina pectoris 12/12/2019   Ischemic cardiomyopathy 12/12/2019   Brain injury (HCC)    Prediabetes    Anoxic brain injury (HCC) 11/05/2019   Dysphagia 11/05/2019   Physical deconditioning 11/05/2019   Acute delirium 11/05/2019   Respiratory failure (HCC)    Encounter for central line placement    Ventricular fibrillation (HCC) 09/23/2019   Acute combined systolic and diastolic heart failure (HCC) 09/23/2019   Ventricular tachycardia, polymorphic (HCC) 09/23/2019   Pelvic pain affecting pregnancy 10/15/2015   Supervision of normal pregnancy in third trimester 09/28/2015   Poor weight gain of pregnancy 09/28/2015   Iron deficiency anemia of pregnancy 09/01/2015   Increased BMI (body mass index) 07/29/2015    REFERRING DIAG: G93.1 (ICD-10-CM) - Anoxic brain injury (HCC)  THERAPY DIAG:  Abnormality of gait and mobility  Difficulty in walking, not elsewhere classified  Unsteadiness on feet  Other abnormalities of gait and mobility  Rationale for Evaluation and Treatment Rehabilitation  PERTINENT HISTORY: Patient is a 36 year old with diagnois of anoxic brain injury. She was receiving outpatient PT until Feb. 2022 last year but ran out of visits. Patient has past medical history of Anoxic Brain Injury 09/2019, CAD, HLD, HTN, ISCHEMIC CARDIMYOPATHY, STEMI, V-FIB, ICD on 11/29/2020  .On 09/23/19 patient presented to ED  2 weeks postpartum (had emergent C-Section) with chest pain. In ED sustained VFib Cardiac Arrest requiring intubation. EKG showed ST elevation, Cath Lab showed aneurysmal dissection of the LAD was identified. Course complicated by encephalopathy, anoxic brain injury, enterobacterial PNA. Patient is currently living with mom. Mother reports she is abel to walk some with a walker with supervision but primarily ambulates with HHA of mother who is her guardian. Mother also reports she requires assist  for all transfers and ADL's.   PRECAUTIONS: Fall  SUBJECTIVE: Patient presents with mom. Mom is practicing her HEP with patient at home.  Patient mother reports no falls or loss of balance since last session.  PAIN:  Are you having pain? No     TODAY'S TREATMENT:  09/22/21 The beginning were performed in private treatment room to minimize distractions from environment   Using red hedge hog per pt favorite color  -march 10x; 2 sets  -LAQ  10x each LE  wth goal of kicking red hedgehog  Hip abduction with sidestep x 10  ea  Hip abduction to pressure of PT hands on hedgehog " pushing into red) x 10 ea LE  Seated heel raise x 10 ea LE, lots of difficulty with sequencing ( tries to push chair back into wall consistently)   Standing:  -lateral stepping length of // bar, good ability at start but decreased quality of sidestepping with fatigue/ reps ( unclear if fatigue is limiting or patient loses interest in activity)    Airex stance x 1 minutes, difficulty with getting on / off of airex pad, able to maintain balance well with some LE trembling, significant cueing required in order to release hands in order for LE to balance.     Ambulation in // bars: Completed 2 lengths of parallel bars with upper extremity assist.  Patient with difficulty making turns as well as positioning to and from chair.  Patient tends to push chair back up on going from sit to stand.  Completed I like the parallel bars and then transitioned from standing to transport chair.  Patient required significant instruction and increased time in order to complete this safely.  Pt required occasional rest breaks due fatigue, PT was quick to ask when pt appeared to be fatiguing in order to prevent excessive fatigue.  Patient continues to demonstrate significant distractions throughout session and difficulty paying attention to tasks.  PATIENT EDUCATION: Education details: HEP Person educated: Restaurant manager, fast food: Consulting civil engineer, Media planner, Corporate treasurer cues, and Verbal cues Education comprehension: returned demonstration and needs further education   HOME EXERCISE PROGRAM: Access Code: [facility] URL: [email or web address] Date: 07/28/2021 Prepared by: Rivka Barbara  Exercises - Seated Long Arc Quad  - 1 x daily - 7 x weekly - 2 sets - 10 reps -Sit to stand- 1 x daily - 7 x weekly - 2 sets - 10 reps  Marching: 1 x daily - 7 x weekly - 2 sets - 10 reps   Access Code: [email or web address] URL: [email or web address] Date: 08/04/2021 Prepared by: Janna Arch  Exercises - Key Pinch with Putty  - 1 x daily - 7 x weekly - 2 sets - 10 reps - 5 hold - Hand Squeezes  - 1 x daily - 7 x weekly - 2 sets - 10 reps - 5 hold - Tip Pinch with Putty  - 1 x daily - 7 x weekly - 2 sets - 10 reps - 5 hold   PT Short Term Goals -  PT SHORT TERM GOAL #1   Title Patient will perform all transfers with Stand by assist using least restrictive AD,  BUE Support, and reaching back for safety to improve her functional independence and decrease fall risk.    Baseline 07/21/21: requires Min A and significant UE utilizaiton    Time 6    Period Weeks    Status Unable to assess              PT Long Term Goals       PT LONG TERM GOAL #1   Title Patient/caregiver will report indepence with HEP with caregiver assistance.    Baseline 6/15: not copmelting HEP regularly/ father unsure of home program/ activities    Time 8    Period Weeks    Status Unable to assess    Target Date 09/15/21      PT LONG TERM GOAL #2   Title Pt will decrease 5XSTS with UE use as needed by at least  15 seconds in order to demonstrate clinically significant improvement in LE strength.    Baseline 6/15: 61.54 sec with UE A    Time 12    Period Weeks    Status Unable to assess    Target Date 10/13/21      PT LONG TERM GOAL #3   Title Pt will decrease TUG to below 34 seconds/decrease in order to  demonstrate decreased fall risk.    Baseline 03/17/2021= 43.49 sec using RW    Time 12    Period Weeks    Status New    Target Date 10/13/21      PT LONG TERM GOAL #4   Title Patient will demo ability to ambulate > 400 ft w/ LRAD versus hand held assist min assist to demonstrate improved household mobility and short community distances.    Baseline 03/17/2021= 125 feet with Min A and RW and cues for proximity to walker    Time 12    Period Weeks    Status New    Target Date 10/13/21      PT LONG TERM GOAL #5   Title Pt will improve FOTO to target score to display perceived improvements in ability to complete ADL's.    Baseline 6/15: no assessed secondary to father not knowing answers to questions, ptsent home with QR code for mother to complete survey at home.    Time 12    Period Weeks    Status New    Target Date 10/13/21      PT LONG TERM GOAL #6   Title Pt will increase by at least 0.15 m/s in order to demonstrate clinically significant improvement in community ambulation.    Baseline 03/17/2021= 0.24 m/s using RW and min A from PT for walker management    Time 12    Period Weeks    Status New    Target Date 10/13/21              Plan -     Clinical Impression Statement Patient is challenged with maintaining orientation to task requiring very frequent multimodal cueing due to distraction. She does well with visual and verbal cues but does fatigue easily and is distracted with the environment.    Is difficult to tell whether patient fatigues or is distracted or mixture of both with several exercises.  Fatigue is noted with lateral stepping as patient begins with good form with abduction but then transitions to hip flexion style but does  not understand cues for correct form extremity difficulty understanding significance of specific tasks.  Patient will continue to benefit from skilled physical therapy to improve her lower extremity strength, ambulatory capacity, improve her  ability to transfer, and prevent falls and improve quality of life.   Personal Factors and Comorbidities Behavior Pattern;Transportation;Comorbidity 3+    Comorbidities HTN, CAD, HLD    Examination-Activity Limitations Bed Mobility;Dressing;Hygiene/Grooming;Caring for Others;Stairs;Stand;Toileting;Transfers;Sit;Bathing;Continence;Lift;Squat;Reach Overhead    Examination-Participation Restrictions Occupation;Medication Management;Cleaning;Yard Work;Personal Finances;Driving    Stability/Clinical Decision Making Evolving/Moderate complexity    Rehab Potential Good    PT Frequency 1x / week    PT Duration 12 weeks    PT Treatment/Interventions ADLs/Self Care Home Management;Aquatic Therapy;Electrical Stimulation;DME Instruction;Gait training;Stair training;Functional mobility training;Therapeutic activities;Therapeutic exercise;Balance training;Neuromuscular re-education;Wheelchair mobility training;Patient/family education;Orthotic Fit/Training;Manual techniques;Passive range of motion;Cryotherapy;Moist Heat    PT Next Visit Plan Discharge today and plan to transition to [name] services    PT Home Exercise Plan Access Code: [email or web address]    Consulted and Agree with Plan of Care Patient;Family member/caregiver    Family Member Consulted Mom               Particia Lather, [place] 09/22/2021, 5:18 PM

## 2021-09-29 ENCOUNTER — Ambulatory Visit: Payer: Medicaid Other | Admitting: Physical Therapy

## 2021-10-05 ENCOUNTER — Encounter: Payer: Self-pay | Admitting: Physical Medicine & Rehabilitation

## 2021-10-05 ENCOUNTER — Encounter: Payer: Medicaid Other | Attending: Physical Medicine & Rehabilitation | Admitting: Physical Medicine & Rehabilitation

## 2021-10-05 VITALS — BP 108/73 | HR 79 | Ht 69.0 in

## 2021-10-05 DIAGNOSIS — G931 Anoxic brain damage, not elsewhere classified: Secondary | ICD-10-CM | POA: Insufficient documentation

## 2021-10-05 DIAGNOSIS — R1312 Dysphagia, oropharyngeal phase: Secondary | ICD-10-CM | POA: Insufficient documentation

## 2021-10-05 DIAGNOSIS — R482 Apraxia: Secondary | ICD-10-CM | POA: Diagnosis present

## 2021-10-05 MED ORDER — METHYLPHENIDATE HCL 10 MG PO TABS: 15.0000 mg | ORAL_TABLET | Freq: Two times a day (BID) | ORAL | 0 refills | Status: DC

## 2021-10-05 MED ORDER — CARBIDOPA-LEVODOPA 10-100 MG PO TABS: 1.0000 | ORAL_TABLET | Freq: Two times a day (BID) | ORAL | 2 refills | Status: DC

## 2021-10-05 NOTE — Progress Notes (Signed)
Subjective:    Patient ID: Beverly [name], female    DOB: [birth year], 36 y.o.   MRN: [id number]  HPI  Beverly [name] is here in follow-up of her anoxic brain injury.  She has restarted speech therapy and durum.  She still has issues with initiation and with management of her secretions.  She does little to initiate swallowing.  The increase in Ritalin seems to have helped with her overall arousal and she tends to do a few things more spontaneously than she did previously such as helping with her personal care and hygiene.  Mom thinks that she may become a bit self-conscious when she is around family and friends.  Otherwise her mood has been fairly upbeat.  She is definitely more alert and awake during the day.  She seems to be sleeping well.  Mom said that she did have a discharge from her ICD recently.  Was felt that the might have been secondary to a sudden noise or movements perhaps.  Mom reports heart rate blood pressure been within normal range.  Pain Inventory Average Pain 0 Pain Right Now 0 My pain is  no pain  LOCATION OF PAIN  no pain  BOWEL Number of stools per week: 6-7   BLADDER Normal Bladder incontinence Yes     Mobility walk with assistance use a walker ability to climb steps?  yes do you drive?  no use a wheelchair needs help with transfers Do you have any goals in this area?  yes  Function disabled: date disabled 09/2019 I need assistance with the following:  feeding, dressing, bathing, toileting, meal prep, household duties, and shopping  Neuro/Psych trouble walking spasms confusion  Prior Studies Any changes since last visit?  no  Physicians involved in your care Any changes since last visit?  no   Family History  Problem Relation Age of Onset   Hyperlipidemia Mother    Hypertension Mother    Diabetes Maternal Grandmother    Seizures Maternal Grandmother    Thyroid disease Maternal Grandmother    Diabetes Paternal Grandmother    Rashes / Skin  problems Son        ezcema   Seizures Son    Social History   Socioeconomic History   Marital status: Single    Spouse name: Not on file   Number of children: 3   Years of education: Not on file   Highest education level: Not on file  Occupational History   Occupation: oncology    Employer: [employer]    Comment: NA  Tobacco Use   Smoking status: Never   Smokeless tobacco: Never  Vaping Use   Vaping Use: Never used  Substance and Sexual Activity   Alcohol use: Not Currently    Alcohol/week: 0.0 standard drinks of alcohol    Comment: quit 2017   Drug use: No    Frequency: 5.0 times per week    Types: Marijuana    Comment: stopped when she found out she was pregnant   Sexual activity: Not Currently    Partners: Male    Birth control/protection: None  Other Topics Concern   Not on file  Social History Narrative   01/06/20 lives with mom and her children   Social Determinants of Health   Financial Resource Strain: Not on file  Food Insecurity: Not on file  Transportation Needs: Not on file  Physical Activity: Not on file  Stress: Not on file  Social Connections: Not on file  Past Surgical History:  Procedure Laterality Date   CARDIAC CATHETERIZATION     IR GASTROSTOMY TUBE MOD SED  10/07/2019   IR GASTROSTOMY TUBE REMOVAL  07/02/2020   LEFT HEART CATH AND CORONARY ANGIOGRAPHY N/A 09/23/2019   Procedure: LEFT HEART CATH AND CORONARY ANGIOGRAPHY;  Surgeon: Yvonne Kendall, MD;  Location: [facility] INVASIVE CV LAB;  Service: Cardiovascular;  Laterality: N/A;   SUBQ ICD IMPLANT N/A 11/26/2020   Procedure: SUBQ ICD IMPLANT;  Surgeon: Lanier Prude, MD;  Location: [facility] INVASIVE CV LAB;  Service: Cardiovascular;  Laterality: N/A;   Past Medical History:  Diagnosis Date   Brain injury (HCC)    CAD (coronary artery disease)    HLD (hyperlipidemia)    Hypertension    last pregnancy   Ischemic cardiomyopathy    STEMI (ST elevation myocardial infarction) (HCC) 09/2019    SCAD with aneurysmal dilation of proximal LAD.   Vaginal Pap smear, abnormal    when she was 36yo   Ventricular fibrillation (HCC) 09/23/2019   BP 108/73   Pulse 79   Ht 5\' 9"  (1.753 m)   SpO2 96%   BMI 23.94 kg/m   Opioid Risk Score:   Fall Risk Score:  `1  Depression screen [facility] 2/9     10/05/2021    3:35 PM 08/03/2021    3:15 PM 06/01/2021    3:14 PM 03/23/2021    3:03 PM 02/02/2021    2:22 PM 09/08/2020    3:45 PM 07/14/2020    3:27 PM  Depression screen PHQ 2/9  Decreased Interest 0 0 0 0 0 0 0  Down, Depressed, Hopeless 0 0 0 0 0 0 0  PHQ - 2 Score 0 0 0 0 0 0 0  Altered sleeping       0  PHQ-9 Score       0     Review of Systems  Constitutional: Negative.   HENT: Negative.    Eyes: Negative.   Respiratory: Negative.    Cardiovascular: Negative.   Gastrointestinal: Negative.   Endocrine: Negative.   Genitourinary:  Positive for difficulty urinating.  Musculoskeletal:  Positive for gait problem.  Skin: Negative.   Allergic/Immunologic: Negative.   Hematological: Negative.   Psychiatric/Behavioral:  Positive for confusion.       Objective:   Physical Exam  General: No acute distress HEENT: NCAT, EOMI, oral membranes moist Cards: reg rate  Chest: normal effort Abdomen: Soft, NT, ND Skin: dry, intact Extremities: no edema Psych: smiling and more engaging Skin: Warm and dry.  Intact.    Musc: No edema in extremities.  No tenderness in extremities. Neuro: Still holding cheeks full of saliva. More engaging. Answers questions. Still very dysarthric. Moves all 4's. Apraxic.       Assessment & Plan:  1. Anoxic BI - substantial attention, initiation deficits as well as memory deficits             MRI/CTs unremarkable             -Recent ICD placement.           -Ritalin has made a difference with her attention and initiation.   May have plateaued somewhat but as a whole seems to be doing more at home for mobility and self-care -continue at 15 mg  bid. -Heart rate and blood pressure have been under good control -Sinemet trial for her apraxia, 10/100 twice daily   2. Dysphagia:             -  ongoing oral motor apraxia and coordination.             Cont PEG as a back up for now             [facility] SLP for cognition and swallowing ongoing                3. Gait abnormality             Cont ambulation with assistance               RW for balance              3. Sleep disturbance: improved             Melatonin 3 mg daily --can take with trazodone            -trazodone at night time scheduled.  this has helped her sleep/wake quite a bit   -More alert during the day per mom   15 minutes of face to face patient care time were spent during this visit. All questions were encouraged and answered.  Follow up with me in 3 mos .

## 2021-10-06 ENCOUNTER — Ambulatory Visit: Payer: Medicaid Other | Admitting: Physical Therapy

## 2021-10-06 ENCOUNTER — Encounter: Payer: Self-pay | Admitting: Physical Therapy

## 2021-10-06 DIAGNOSIS — R269 Unspecified abnormalities of gait and mobility: Secondary | ICD-10-CM | POA: Diagnosis not present

## 2021-10-06 DIAGNOSIS — R262 Difficulty in walking, not elsewhere classified: Secondary | ICD-10-CM

## 2021-10-06 DIAGNOSIS — R2689 Other abnormalities of gait and mobility: Secondary | ICD-10-CM

## 2021-10-06 DIAGNOSIS — R2681 Unsteadiness on feet: Secondary | ICD-10-CM

## 2021-10-06 NOTE — Therapy (Signed)
OUTPATIENT PHYSICAL THERAPY TREATMENT NOTE   Patient Name: Beverly [name] MRN: [id number] DOB:01-23-1986, 36 y.o., female Today's Date: 10/06/2021  PCP: Ane Payment, [place] REFERRING PROVIDER: Meredith Staggers, MD   PT End of Session - 10/06/21 1451     Visit Number 7    Number of Visits 12    Date for PT Re-Evaluation 10/13/21    Authorization Type Medicaid approval- 29 visits for OT/PT/SLP combined    Progress Note Due on Visit 10    PT Start Time 1434    PT Stop Time 1514    PT Time Calculation (min) 40 min    Equipment Utilized During Treatment Gait belt    Activity Tolerance Patient tolerated treatment well    Behavior During Therapy Impulsive                 Past Medical History:  Diagnosis Date   Brain injury ([place])    CAD (coronary artery disease)    HLD (hyperlipidemia)    Hypertension    last pregnancy   Ischemic cardiomyopathy    STEMI (ST elevation myocardial infarction) ([place]) 09/2019   SCAD with aneurysmal dilation of proximal LAD.   Vaginal Pap smear, abnormal    when she was 36yo   Ventricular fibrillation ([place]) 09/23/2019   Past Surgical History:  Procedure Laterality Date   CARDIAC CATHETERIZATION     IR GASTROSTOMY TUBE MOD SED  10/07/2019   IR GASTROSTOMY TUBE REMOVAL  07/02/2020   LEFT HEART CATH AND CORONARY ANGIOGRAPHY N/A 09/23/2019   Procedure: LEFT HEART CATH AND CORONARY ANGIOGRAPHY;  Surgeon: Nelva Bush, MD;  Location: [place] CV LAB;  Service: Cardiovascular;  Laterality: N/A;   SUBQ ICD IMPLANT N/A 11/26/2020   Procedure: SUBQ ICD IMPLANT;  Surgeon: Vickie Epley, MD;  Location: [place] CV LAB;  Service: Cardiovascular;  Laterality: N/A;   Patient Active Problem List   Diagnosis Date Noted   Apraxia 10/05/2021   S/P ICD (internal cardiac defibrillator) procedure 11/27/2020   Hx of Cardiac arrest ([place]) 11/26/2020   Abnormality of gait 03/16/2020   Sleep disturbance 03/16/2020   Oropharyngeal  dysphagia 02/12/2020   Coronary artery disease involving native coronary artery of native heart without angina pectoris 12/12/2019   Ischemic cardiomyopathy 12/12/2019   Brain injury ([place])    Prediabetes    Anoxic brain injury ([place]) 11/05/2019   Dysphagia 11/05/2019   Physical deconditioning 11/05/2019   Acute delirium 11/05/2019   Respiratory failure ([place])    Encounter for central line placement    Ventricular fibrillation ([place]) 09/23/2019   Acute combined systolic and diastolic heart failure ([place]) 09/23/2019   Ventricular tachycardia, polymorphic ([place]) 09/23/2019   Pelvic pain affecting pregnancy 10/15/2015   Supervision of normal pregnancy in third trimester 09/28/2015   Poor weight gain of pregnancy 09/28/2015   Iron deficiency anemia of pregnancy 09/01/2015   Increased BMI (body mass index) 07/29/2015    REFERRING DIAG: G93.1 (ICD-10-CM) - Anoxic brain injury ([place])  THERAPY DIAG:  Abnormality of gait and mobility  Difficulty in walking, not elsewhere classified  Unsteadiness on feet  Other abnormalities of gait and mobility  Rationale for Evaluation and Treatment Rehabilitation  PERTINENT HISTORY: Patient is a 36 year old with diagnois of anoxic brain injury. She was receiving outpatient PT until Feb. 2022 last year but ran out of visits. Patient has past medical history of Anoxic Brain Injury 09/2019, CAD, HLD, HTN, ISCHEMIC CARDIMYOPATHY, STEMI, V-FIB, ICD on 11/29/2020  .On  09/23/19 patient presented to ED 2 weeks postpartum (had emergent C-Section) with chest pain. In ED sustained VFib Cardiac Arrest requiring intubation. EKG showed ST elevation, Cath Lab showed aneurysmal dissection of the LAD was identified. Course complicated by encephalopathy, anoxic brain injury, enterobacterial PNA. Patient is currently living with mom. Mother reports she is abel to walk some with a walker with supervision but primarily ambulates with HHA of mother who is her guardian. Mother also  reports she requires assist for all transfers and ADL's.   PRECAUTIONS: Fall  SUBJECTIVE: Patient presents with her father this date.  Her father reports no significant changes since last session.  PAIN:  Are you having pain? No     TODAY'S TREATMENT:  10/06/21 The beginning exercises were performed in private treatment room to minimize distractions from environment   Sit to stand x9 repetitions without upper extremity assist with patient seated on large treatment table -Patient requires cues for proper standing as well as min assist to prevent anterior loss of balance upon standing.  Patient has adequate leg strength with activity as evidenced by ability to perform standing up with these as well as with appropriate quickness on first couple repetitions.  With increased reps patient searches treatment area for something to hold onto including her father's leg as well as physical therapists are not upper extremity motor assist her with standing. Using red hedge hog per pt favorite color  -march 10x; 2 sets with 3# AW   -LAQ  3# Aw 2 x 8 ea LE  each LE  wth goal of kicking red hedgehog  Hip abduction with sidestep x 10  ea  Hip abduction to pressure of PT hands on hedgehog " pushing into red) x 10 ea LE  Seated heel raise, unable to complete this date, pt pushes legs under table when PT attmepts to provide tactile cues.   Standing:   Attempt to have patient perform lateral stepping but patient ends up during entire body laterally and walking with normal anterior gait.  Patient is distracted by items type II walls and has to be redirected to ambulation activities.  Following seated rest after the activity completed 2 bouts of ambulation both approximately 100 feet each with rolling walker.  Patient is very distracted when performing walking activities constantly surveying her environment the pain little attention to walker, proximity to walker, or where she is walking.  Patient frequently  will steer toward wall and has to be redirected to prevent walker from running into wall.  Patient also has to be redirected to prevent running into chair in the hallway and requires frequent verbal as well as tactile cues for safe ambulation.  Pt required occasional rest breaks due fatigue, PT was quick to ask when pt appeared to be fatiguing in order to prevent excessive fatigue.  Patient continues to demonstrate significant distractions throughout session and difficulty paying attention to tasks.  PATIENT EDUCATION: Education details: HEP Person educated: Cytogeneticist: Programmer, multimedia, Facilities manager, Actor cues, and Verbal cues Education comprehension: returned demonstration and needs further education   HOME EXERCISE PROGRAM: Access Code: [facility] URL: [email or web address] Date: 07/28/2021 Prepared by: Thresa Ross  Exercises - Seated Long Arc Quad  - 1 x daily - 7 x weekly - 2 sets - 10 reps -Sit to stand- 1 x daily - 7 x weekly - 2 sets - 10 reps  Marching: 1 x daily - 7 x weekly - 2 sets - 10 reps   Access Code: [email or web address] URL: [email or web address]  Date: 08/04/2021 Prepared by: Janna Arch  Exercises - Key Pinch with Putty  - 1 x daily - 7 x weekly - 2 sets - 10 reps - 5 hold - Hand Squeezes  - 1 x daily - 7 x weekly - 2 sets - 10 reps - 5 hold - Tip Pinch with Putty  - 1 x daily - 7 x weekly - 2 sets - 10 reps - 5 hold   PT Short Term Goals -       PT SHORT TERM GOAL #1   Title Patient will perform all transfers with Stand by assist using least restrictive AD,  BUE Support, and reaching back for safety to improve her functional independence and decrease fall risk.    Baseline 07/21/21: requires Min A and significant UE utilizaiton    Time 6    Period Weeks    Status Unable to assess              PT Long Term Goals       PT LONG TERM GOAL #1   Title Patient/caregiver will report indepence with HEP with caregiver  assistance.    Baseline 6/15: not copmelting HEP regularly/ father unsure of home program/ activities    Time 8    Period Weeks    Status Unable to assess    Target Date 09/15/21      PT LONG TERM GOAL #2   Title Pt will decrease 5XSTS with UE use as needed by at least  15 seconds in order to demonstrate clinically significant improvement in LE strength.    Baseline 6/15: 61.54 sec with UE A    Time 12    Period Weeks    Status Unable to assess    Target Date 10/13/21      PT LONG TERM GOAL #3   Title Pt will decrease TUG to below 34 seconds/decrease in order to demonstrate decreased fall risk.    Baseline 03/17/2021= 43.49 sec using RW    Time 12    Period Weeks    Status New    Target Date 10/13/21      PT LONG TERM GOAL #4   Title Patient will demo ability to ambulate > 400 ft w/ LRAD versus hand held assist min assist to demonstrate improved household mobility and short community distances.    Baseline 03/17/2021= 125 feet with Min A and RW and cues for proximity to walker    Time 12    Period Weeks    Status New    Target Date 10/13/21      PT LONG TERM GOAL #5   Title Pt will improve FOTO to target score to display perceived improvements in ability to complete ADL's.    Baseline 6/15: no assessed secondary to father not knowing answers to questions, ptsent home with QR code for mother to complete survey at home.    Time 12    Period Weeks    Status New    Target Date 10/13/21      PT LONG TERM GOAL #6   Title Pt will increase 10MWT by at least 0.15 m/s in order to demonstrate clinically significant improvement in community ambulation.    Baseline 03/17/2021= 0.24 m/s using RW and min A from PT for walker management    Time 12    Period Weeks    Status New    Target Date 10/13/21  Plan -     Clinical Impression Statement Patient is challenged with maintaining orientation to task requiring very frequent multimodal cueing due to distraction. She does  well with visual and verbal cues but does fatigue easily and is distracted with the environment of the clinic.    Is difficult to tell whether patient fatigues or is distracted or mixture of both with several exercises.  Patient will continue to benefit from skilled physical therapy to improve her lower extremity strength, ambulatory capacity, improve her ability to transfer, and prevent falls and improve quality of life.   Personal Factors and Comorbidities Behavior Pattern;Transportation;Comorbidity 3+    Comorbidities HTN, CAD, HLD    Examination-Activity Limitations Bed Mobility;Dressing;Hygiene/Grooming;Caring for Others;Stairs;Stand;Toileting;Transfers;Sit;Bathing;Continence;Lift;Squat;Reach Overhead    Examination-Participation Restrictions Occupation;Medication Management;Cleaning;Yard Work;Personal Finances;Driving    Stability/Clinical Decision Making Evolving/Moderate complexity    Rehab Potential Good    PT Frequency 1x / week    PT Duration 12 weeks    PT Treatment/Interventions ADLs/Self Care Home Management;Aquatic Therapy;Electrical Stimulation;DME Instruction;Gait training;Stair training;Functional mobility training;Therapeutic activities;Therapeutic exercise;Balance training;Neuromuscular re-education;Wheelchair mobility training;Patient/family education;Orthotic Fit/Training;Manual techniques;Passive range of motion;Cryotherapy;Moist Heat    PT Next Visit Plan Discharge today and plan to transition to [facility] services    PT Home Exercise Plan Access Code: [email or web address]    Consulted and Agree with Plan of Care Patient;Family member/caregiver    Family Member Consulted Mom               Norman Herrlich, [place] 10/06/2021, 4:57 PM

## 2021-10-07 ENCOUNTER — Encounter: Payer: Self-pay | Admitting: Physical Medicine & Rehabilitation

## 2021-10-11 NOTE — Progress Notes (Signed)
Remote ICD transmission.   

## 2021-10-17 ENCOUNTER — Telehealth: Payer: Self-pay

## 2021-10-17 NOTE — Telephone Encounter (Signed)
Device alert for HV therapy Event occurred 9/7 @ 13:23, EGM appears oversensing leading to inappropriate shock x1. Route to triage.  Spoke with Joey and Dr. Lalla Brothers, confirmed inappropriate shock due to oversensing. Patient has device clinic apt 10/18/21 @ 2:00 pm for reprogramming. Joey and patients mother is aware who will bring her.

## 2021-10-18 ENCOUNTER — Ambulatory Visit: Payer: Medicaid Other | Attending: Internal Medicine

## 2021-10-18 DIAGNOSIS — I4901 Ventricular fibrillation: Secondary | ICD-10-CM | POA: Insufficient documentation

## 2021-10-18 LAB — CUP PACEART INCLINIC DEVICE CHECK
Date Time Interrogation Session: 20230912143237
Implantable Pulse Generator Implant Date: 20221021
Pulse Gen Serial Number: 161805

## 2021-10-18 NOTE — Progress Notes (Signed)
Refernce 09/20/21 note.  Patient has had numerous shocks in primary and alternate. Changed today to secondary. The signal in secondary looks great while stationary, however noise (not identified as true arrhythmia) is noted with raising of the left arm. Should inappropriate shocks occur in this configuration, would recommend revision or device off. See scanned media.   Assisted by Retta Mac Sci.

## 2021-10-20 ENCOUNTER — Ambulatory Visit: Payer: Medicaid Other | Attending: Physical Medicine & Rehabilitation | Admitting: Physical Therapy

## 2021-10-20 DIAGNOSIS — M6281 Muscle weakness (generalized): Secondary | ICD-10-CM

## 2021-10-20 DIAGNOSIS — R262 Difficulty in walking, not elsewhere classified: Secondary | ICD-10-CM | POA: Diagnosis present

## 2021-10-20 DIAGNOSIS — R269 Unspecified abnormalities of gait and mobility: Secondary | ICD-10-CM | POA: Diagnosis present

## 2021-10-20 DIAGNOSIS — R2681 Unsteadiness on feet: Secondary | ICD-10-CM | POA: Diagnosis present

## 2021-10-20 DIAGNOSIS — R2689 Other abnormalities of gait and mobility: Secondary | ICD-10-CM | POA: Diagnosis present

## 2021-10-20 NOTE — Therapy (Signed)
OUTPATIENT PHYSICAL THERAPY TREATMENT NOTE/ Recert note    Patient Name: Beverly [name] MRN: [id number] DOB:18-Mar-1985, 36 y.o., female Today's Date: 10/20/2021  PCP: Ane Payment, [place] REFERRING PROVIDER: Meredith Staggers, MD   PT End of Session - 10/20/21 1435     Visit Number 8    Number of Visits 12    Date for PT Re-Evaluation 10/13/21    Authorization Type Medicaid approval- 68 visits for OT/PT/SLP combined    Progress Note Due on Visit 10    PT Start Time 1432    PT Stop Time 1515    PT Time Calculation (min) 43 min    Equipment Utilized During Treatment Gait belt    Activity Tolerance Patient tolerated treatment well    Behavior During Therapy Impulsive                  Past Medical History:  Diagnosis Date   Brain injury ([place])    CAD (coronary artery disease)    HLD (hyperlipidemia)    Hypertension    last pregnancy   Ischemic cardiomyopathy    STEMI (ST elevation myocardial infarction) ([place]) 09/2019   SCAD with aneurysmal dilation of proximal LAD.   Vaginal Pap smear, abnormal    when she was 36yo   Ventricular fibrillation ([place]) 09/23/2019   Past Surgical History:  Procedure Laterality Date   CARDIAC CATHETERIZATION     IR GASTROSTOMY TUBE MOD SED  10/07/2019   IR GASTROSTOMY TUBE REMOVAL  07/02/2020   LEFT HEART CATH AND CORONARY ANGIOGRAPHY N/A 09/23/2019   Procedure: LEFT HEART CATH AND CORONARY ANGIOGRAPHY;  Surgeon: Nelva Bush, MD;  Location: [facility];  Service: Cardiovascular;  Laterality: N/A;   SUBQ ICD IMPLANT N/A 11/26/2020   Procedure: SUBQ ICD IMPLANT;  Surgeon: Vickie Epley, MD;  Location: [facility];  Service: Cardiovascular;  Laterality: N/A;   Patient Active Problem List   Diagnosis Date Noted   Apraxia 10/05/2021   S/P ICD (internal cardiac defibrillator) procedure 11/27/2020   Hx of Cardiac arrest ([place]) 11/26/2020   Abnormality of gait 03/16/2020   Sleep disturbance 03/16/2020    Oropharyngeal dysphagia 02/12/2020   Coronary artery disease involving native coronary artery of native heart without angina pectoris 12/12/2019   Ischemic cardiomyopathy 12/12/2019   Brain injury ([place])    Prediabetes    Anoxic brain injury ([place]) 11/05/2019   Dysphagia 11/05/2019   Physical deconditioning 11/05/2019   Acute delirium 11/05/2019   Respiratory failure ([place])    Encounter for central line placement    Ventricular fibrillation ([place]) 09/23/2019   Acute combined systolic and diastolic heart failure ([place]) 09/23/2019   Ventricular tachycardia, polymorphic ([place]) 09/23/2019   Pelvic pain affecting pregnancy 10/15/2015   Supervision of normal pregnancy in third trimester 09/28/2015   Poor weight gain of pregnancy 09/28/2015   Iron deficiency anemia of pregnancy 09/01/2015   Increased BMI (body mass index) 07/29/2015    REFERRING DIAG: G93.1 (ICD-10-CM) - Anoxic brain injury ([place])  THERAPY DIAG:  Abnormality of gait and mobility  Difficulty in walking, not elsewhere classified  Unsteadiness on feet  Other abnormalities of gait and mobility  Muscle weakness (generalized)  Rationale for Evaluation and Treatment Rehabilitation  PERTINENT HISTORY: Patient is a 36 year old with diagnois of anoxic brain injury. She was receiving outpatient PT until Feb. 2022 last year but ran out of visits. Patient has past medical history of Anoxic Brain Injury 09/2019, CAD, HLD, HTN, ISCHEMIC  CARDIMYOPATHY, STEMI, V-FIB, ICD on 11/29/2020  .On 09/23/19 patient presented to ED 2 weeks postpartum (had emergent C-Section) with chest pain. In ED sustained VFib Cardiac Arrest requiring intubation. EKG showed ST elevation, Cath Lab showed aneurysmal dissection of the LAD was identified. Course complicated by encephalopathy, anoxic brain injury, enterobacterial PNA. Patient is currently living with mom. Mother reports she is abel to walk some with a walker with supervision but primarily ambulates with  HHA of mother who is her guardian. Mother also reports she requires assist for all transfers and ADL's.   PRECAUTIONS: Fall  SUBJECTIVE: Patient presents with her father this date.  Her father reports no significant changes since last session.  PAIN:  Are you having pain? No     TODAY'S TREATMENT:  10/20/21 Physical therapy treatment session today consisted of completing assessment of goals and administration of testing as demonstrated and documented in flow sheet, treatment, and goals section of this note. Addition treatments may be found below.   Patient performs ambulation of 225 feet with rolling walker significantly improved from initial visit but still requiring contact-guard assist as well as cues for proximity to walker.  Patient is better able to walk without cues for direction at this time.  Patient also able to perform transition to Tory movements from standing to seated following walk with improved independence.  Following testing of all goals patient performs standing marches times approximately 5 on each leg as well as lateral stepping patient has increased difficulty stepping to the left laterally compared to stepping to the right.  With 5 times sit to stand test patient did have significant lower extremity trembling toward end of fifth repetition and did require upper extremity assist on several repetitions.  Pt required occasional rest breaks due fatigue, PT was quick to ask when pt appeared to be fatiguing in order to prevent excessive fatigue.  Patient continues to demonstrate significant distractions throughout session and difficulty paying attention to tasks.  PATIENT EDUCATION: Education details: HEP Person educated: Wellsite geologist: Consulting civil engineer, Media planner, Corporate treasurer cues, and Verbal cues Education comprehension: returned demonstration and needs further education   HOME EXERCISE PROGRAM: Access Code: [facility] URL:  [email or web address] Date: 07/28/2021 Prepared by: Rivka Barbara  Exercises - Seated Long Arc Quad  - 1 x daily - 7 x weekly - 2 sets - 10 reps -Sit to stand- 1 x daily - 7 x weekly - 2 sets - 10 reps  Marching: 1 x daily - 7 x weekly - 2 sets - 10 reps   Access Code: [email or web address] URL: [email or web address] Date: 08/04/2021 Prepared by: Janna Arch  Exercises - Key Pinch with Putty  - 1 x daily - 7 x weekly - 2 sets - 10 reps - 5 hold - Hand Squeezes  - 1 x daily - 7 x weekly - 2 sets - 10 reps - 5 hold - Tip Pinch with Putty  - 1 x daily - 7 x weekly - 2 sets - 10 reps - 5 hold   PT Short Term Goals -       PT SHORT TERM GOAL #1   Title Patient will perform all transfers with Stand by assist using least restrictive AD,  BUE Support, and reaching back for safety to improve her functional independence and decrease fall risk.    Baseline 07/21/21: requires Min A and significant UE utilizaiton  9/14: SBA and UE use but completes safely.    Time 6    Period Weeks    Status  Met, performing transfers to and from transport chair to armchair utilizing 7 upper extremity assist able to do it independently and safely with only standby assist from physical therapist             PT [place] #1   Title Patient/caregiver will report indepence with HEP with caregiver assistance.    Baseline 6/15: not copmelting HEP regularly/ father unsure of home program/ activities  9/14: been completing regularly    Time 8    Period Weeks    Status MET   Target Date 09/15/21      PT LONG TERM GOAL #2   Title Pt will decrease 5XSTS with UE use as needed by at least  15 seconds in order to demonstrate clinically significant improvement in LE strength.    Baseline 6/15: 61.54 sec with UE A 9/14: 25.01 sec, UE assist, LE trembling   Time 12    Period Weeks    Status ONGOING    Target Date 01/12/2022      PT LONG TERM GOAL #3   Title Pt  will decrease TUG to below 34 seconds/decrease in order to demonstrate decreased fall risk.    Baseline 03/17/2021= 43.49 sec using RW 9/14: 40.6 sec    Time 12    Period Weeks    Status In ONGOING    Target Date 01/12/2022      PT LONG TERM GOAL #4   Title Patient will demo ability to ambulate > 400 ft w/ LRAD versus hand held assist min assist to demonstrate improved household mobility and short community distances.    Baseline 03/17/2021= 125 feet with Min A and RW and cues for proximity to walker 9/14: 225 ft in 3:44 sec    Time 12    Period Weeks    Status ONGOING    Target Date 01/12/2022      PT LONG TERM GOAL #5   Title Pt will improve FOTO to target score to display perceived improvements in ability to complete ADL's.    Baseline 6/15: no assessed secondary to father not knowing answers to questions, ptsent home with QR code for mother to complete survey at home.    Time 12    Period Weeks    Status Defer to next visit, QR code sent with father to give to mother for filling out. 9/14: pt mother performing at home between sessions    Target Date 01/12/2022      PT LONG TERM GOAL #6   Title Pt will increase 10MWT by at least 0.15 m/s in order to demonstrate clinically significant improvement in community ambulation.    Baseline 03/17/2021= 0.24 m/s using RW and min A from PT for walker management  9/14: .61ms   Time 12    Period Weeks    Status ONGOING    Target Date 01/12/2022               Plan -     Clinical Impression Statement Patient making good progress toward all of her goals indicating improved overall function and decreased risk of falls.  Patient able to ambulate increased distance this session indicating improved function ambulation in the community.  Patient also able to demonstrate improved 5 times sit to stand as well as timed up and go indicating both improved ability to perform transitional movements as well as decrease risk of falls.  The patient making  progress toward  all her goals patient still progressing toward her goals and will continue to benefit from skilled physical therapy to further improve her lower extremity strength, coordination, endurance, overall function, and to prevent falls and improve her quality of life.Patient's condition has the potential to improve in response to therapy. Maximum improvement is yet to be obtained. The anticipated improvement is attainable and reasonable in a generally predictable time.      Personal Factors and Comorbidities Behavior Pattern;Transportation;Comorbidity 3+    Comorbidities HTN, CAD, HLD    Examination-Activity Limitations Bed Mobility;Dressing;Hygiene/Grooming;Caring for Others;Stairs;Stand;Toileting;Transfers;Sit;Bathing;Continence;Lift;Squat;Reach Overhead    Examination-Participation Restrictions Occupation;Medication Management;Cleaning;Yard Work;Personal Finances;Driving    Stability/Clinical Decision Making Evolving/Moderate complexity    Rehab Potential Good    PT Frequency 1x / week    PT Duration 12 weeks    PT Treatment/Interventions ADLs/Self Care Home Management;Aquatic Therapy;Electrical Stimulation;DME Instruction;Gait training;Stair training;Functional mobility training;Therapeutic activities;Therapeutic exercise;Balance training;Neuromuscular re-education;Wheelchair mobility training;Patient/family education;Orthotic Fit/Training;Manual techniques;Passive range of motion;Cryotherapy;Moist Heat    PT Next Visit Plan Discharge today and plan to transition to [facility] services    PT Home Exercise Plan Access Code: [email or web address]    Consulted and Agree with Plan of Care Patient;Family member/caregiver    Family Member Consulted Mom               Particia Lather, [place] 10/20/2021, 5:14 PM

## 2021-10-25 IMAGING — CT CT HEAD W/O CM
3 of 4 series · 14 of 47 positions shown, 16 images · non-contrast
Comparison: None.

CLINICAL DATA: Cardiac arrest

EXAM:
CT HEAD WITHOUT CONTRAST
TECHNIQUE: Contiguous axial images were obtained from the base of the skull
through the vertex without intravenous contrast.

[Series 4: ax head wo · axial · 0.40mm/px · z∈[-142,-21]mm · 8 of 31 slices shown, 10 images]
[im 3/31  brain]
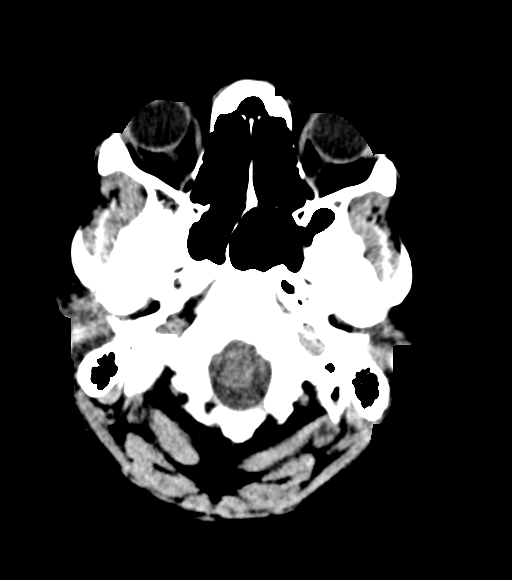
[im 3/31  bone]
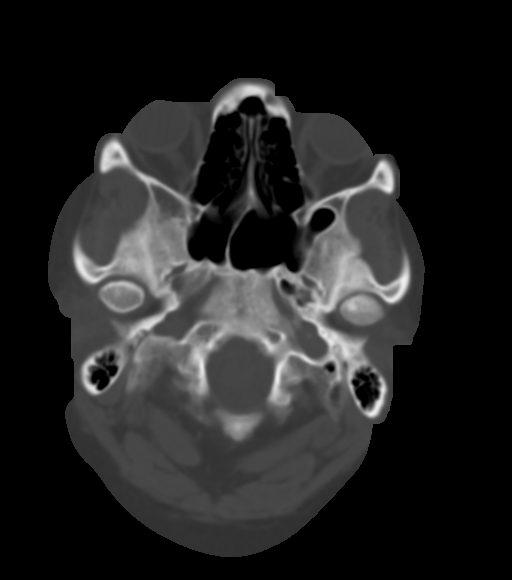
[im 7/31  brain]
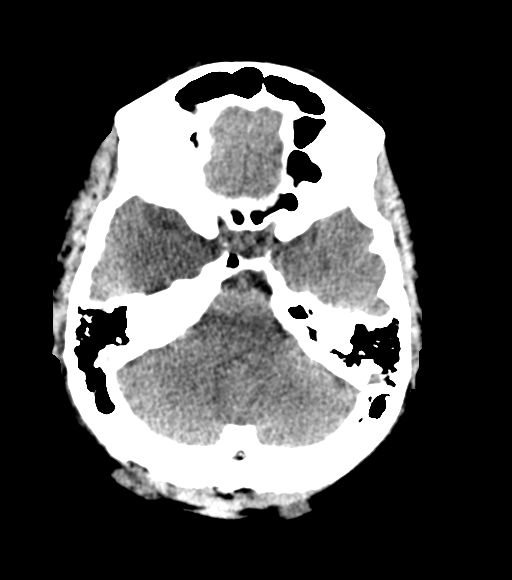
[im 11/31  brain]
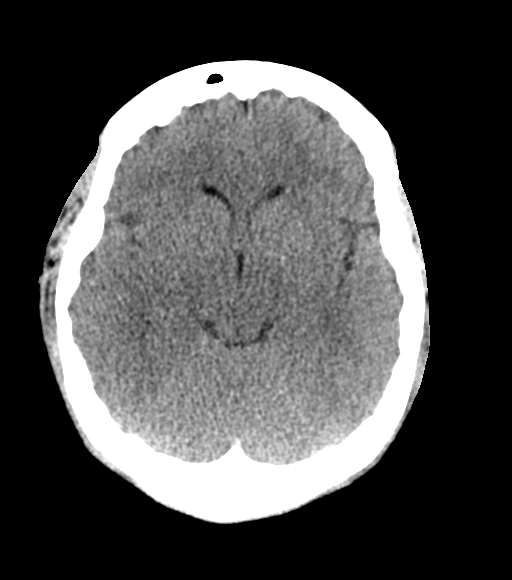
[im 13/31  brain]
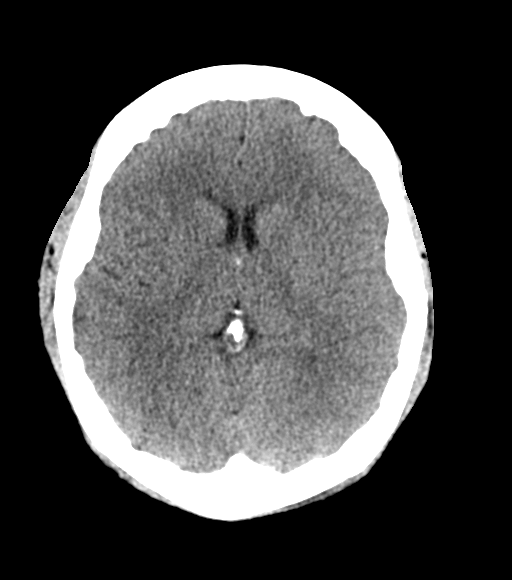
[im 18/31  brain]
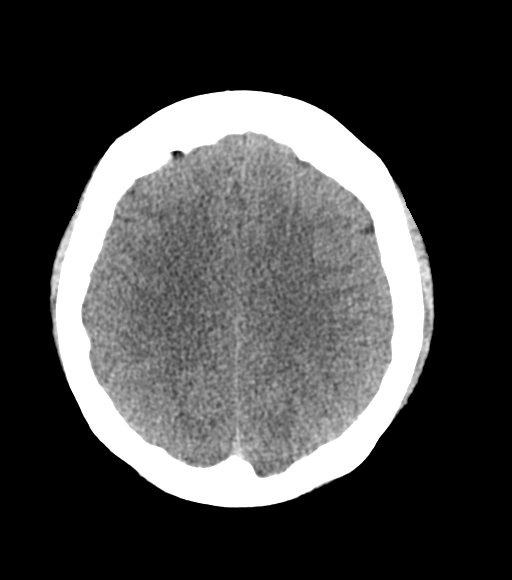
[im 18/31  bone]
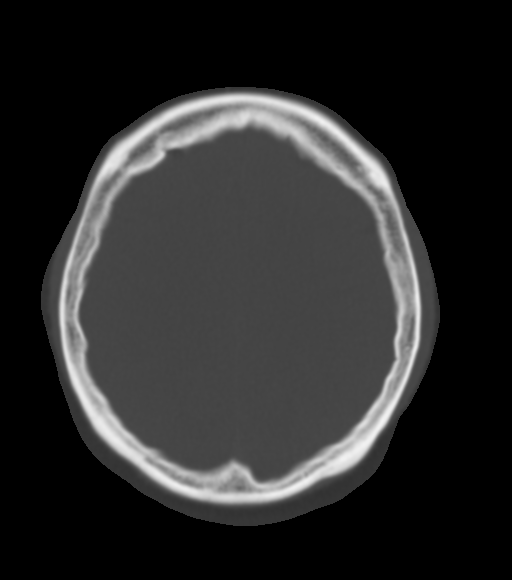
[im 20/31  brain]
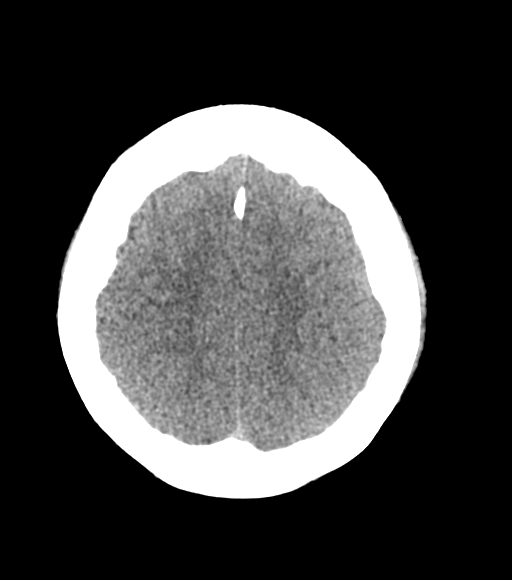
[im 24/31  brain]
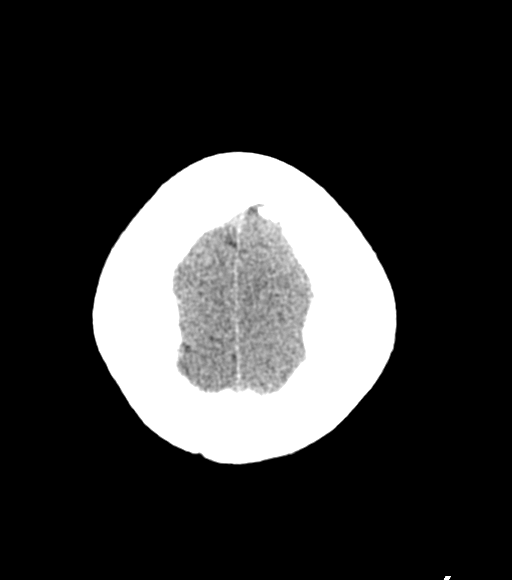
[im 28/31  brain]
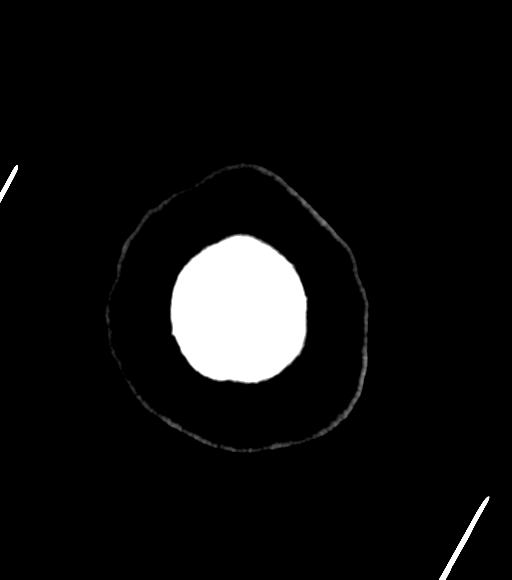

[Series 7: coronal soft tissue · coronal · 0.31mm/px · 3 of 64 slices shown]
[im 22/64  brain]
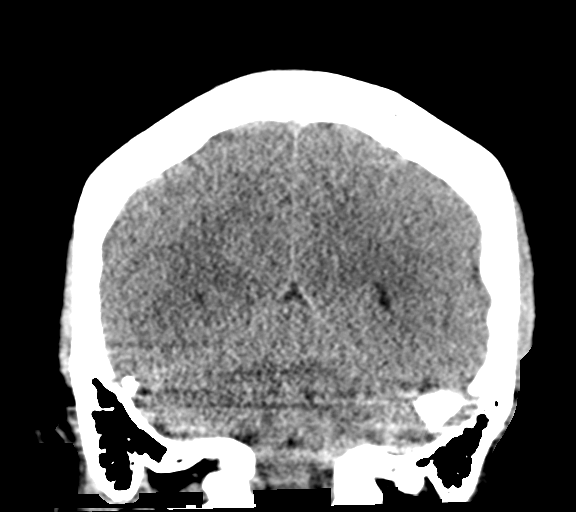
[im 29/64  brain]
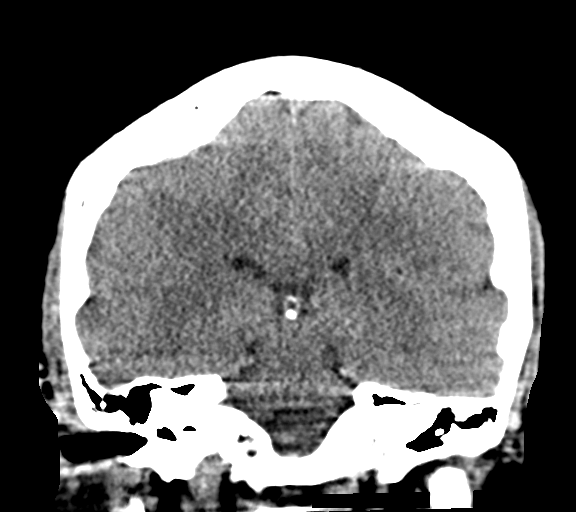
[im 36/64  brain]
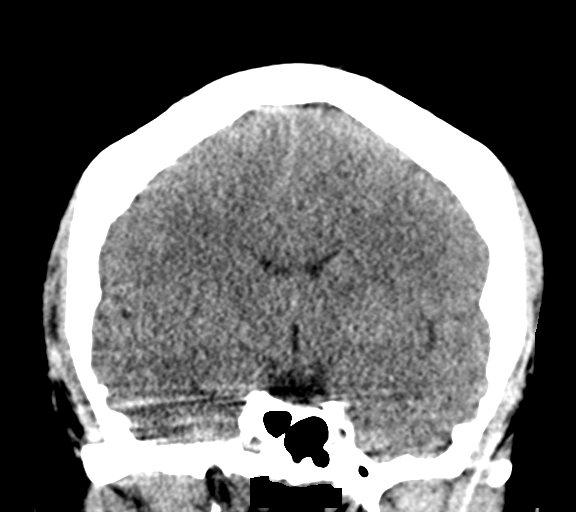

[Series 8: sagittal soft tissue · sagittal · 0.32mm/px · 3 of 57 slices shown]
[im 19/57  brain]
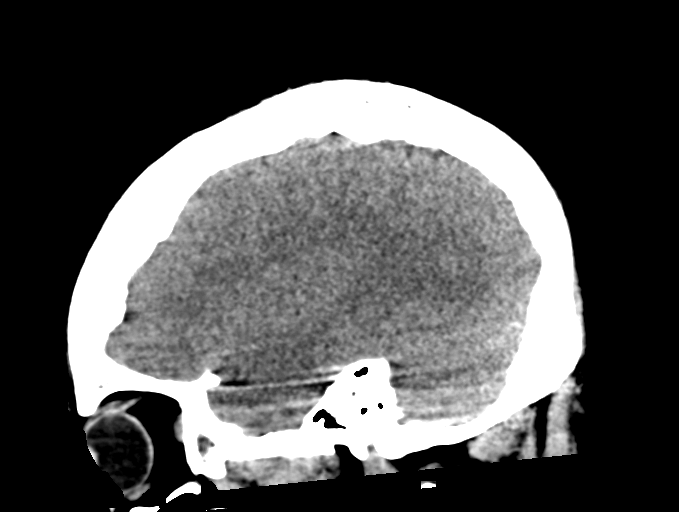
[im 29/57  brain]
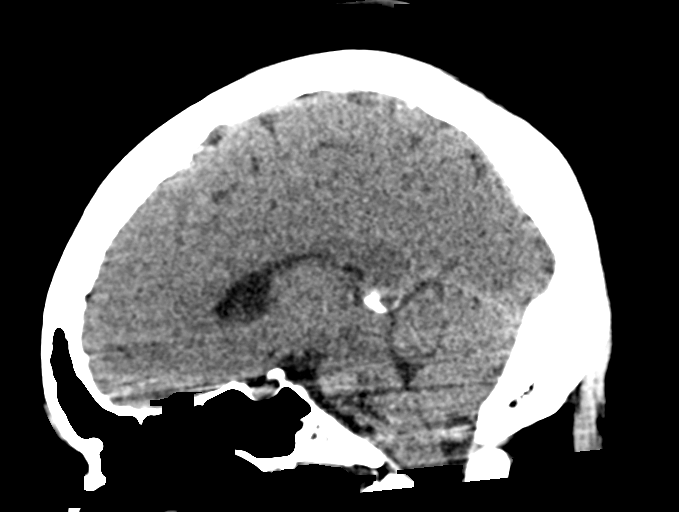
[im 38/57  brain]
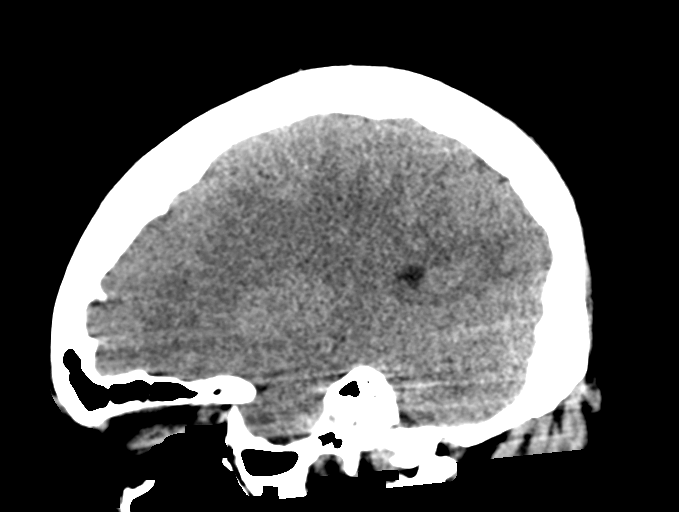

[14 of 47 positions shown; findings below may reference images not displayed]

FINDINGS: Brain: There is no mass, hemorrhage or extra-axial collection. The
size and configuration of the ventricles and extra-axial CSF spaces
are normal. The brain parenchyma is normal, without acute or chronic
infarction. Punctate colloid cyst at the level of the foramina of
[place] is incidentally noted.

Vascular: No abnormal hyperdensity of the major intracranial
arteries or dural venous sinuses. No intracranial atherosclerosis.

Skull: The visualized skull base, calvarium and extracranial soft
tissues are normal.

Sinuses/Orbits: No fluid levels or advanced mucosal thickening of
the visualized paranasal sinuses. No mastoid or middle ear effusion.
The orbits are normal.
IMPRESSION: Normal head CT.

## 2021-10-27 ENCOUNTER — Ambulatory Visit: Payer: Medicaid Other | Admitting: Physical Therapy

## 2021-10-27 DIAGNOSIS — R2681 Unsteadiness on feet: Secondary | ICD-10-CM

## 2021-10-27 DIAGNOSIS — M6281 Muscle weakness (generalized): Secondary | ICD-10-CM

## 2021-10-27 DIAGNOSIS — R269 Unspecified abnormalities of gait and mobility: Secondary | ICD-10-CM | POA: Diagnosis not present

## 2021-10-27 DIAGNOSIS — R262 Difficulty in walking, not elsewhere classified: Secondary | ICD-10-CM

## 2021-10-27 NOTE — Therapy (Signed)
OUTPATIENT PHYSICAL THERAPY TREATMENT NOTE/ Recert note    Patient Name: Beverly [name] MRN: [id number] DOB:03/25/1985, 36 y.o., female Today's Date: 10/28/2021  PCP: Ane Payment, [place] REFERRING PROVIDER: Meredith Staggers, MD   PT End of Session - 10/28/21 0756     Visit Number 9    Number of Visits 20    Date for PT Re-Evaluation 01/12/22    Authorization Type Medicaid approval- 47 visits for OT/PT/SLP combined    Authorization Time Period Auth: 9/14-12/6 12 visits    Authorization - Visit Number 1    Authorization - Number of Visits 12    Progress Note Due on Visit 10    PT Start Time 1435    PT Stop Time 1513    PT Time Calculation (min) 38 min    Equipment Utilized During Treatment Gait belt    Activity Tolerance Patient tolerated treatment well    Behavior During Therapy Impulsive                   Past Medical History:  Diagnosis Date   Brain injury ([place])    CAD (coronary artery disease)    HLD (hyperlipidemia)    Hypertension    last pregnancy   Ischemic cardiomyopathy    STEMI (ST elevation myocardial infarction) ([place]) 09/2019   SCAD with aneurysmal dilation of proximal LAD.   Vaginal Pap smear, abnormal    when she was 36yo   Ventricular fibrillation ([place]) 09/23/2019   Past Surgical History:  Procedure Laterality Date   CARDIAC CATHETERIZATION     IR GASTROSTOMY TUBE MOD SED  10/07/2019   IR GASTROSTOMY TUBE REMOVAL  07/02/2020   LEFT HEART CATH AND CORONARY ANGIOGRAPHY N/A 09/23/2019   Procedure: LEFT HEART CATH AND CORONARY ANGIOGRAPHY;  Surgeon: Nelva Bush, MD;  Location: [facility];  Service: Cardiovascular;  Laterality: N/A;   SUBQ ICD IMPLANT N/A 11/26/2020   Procedure: SUBQ ICD IMPLANT;  Surgeon: Vickie Epley, MD;  Location: [place];  Service: Cardiovascular;  Laterality: N/A;   Patient Active Problem List   Diagnosis Date Noted   Apraxia 10/05/2021   S/P ICD (internal cardiac  defibrillator) procedure 11/27/2020   Hx of Cardiac arrest ([place]) 11/26/2020   Abnormality of gait 03/16/2020   Sleep disturbance 03/16/2020   Oropharyngeal dysphagia 02/12/2020   Coronary artery disease involving native coronary artery of native heart without angina pectoris 12/12/2019   Ischemic cardiomyopathy 12/12/2019   Brain injury ([place])    Prediabetes    Anoxic brain injury ([place]) 11/05/2019   Dysphagia 11/05/2019   Physical deconditioning 11/05/2019   Acute delirium 11/05/2019   Respiratory failure ([place])    Encounter for central line placement    Ventricular fibrillation ([place]) 09/23/2019   Acute combined systolic and diastolic heart failure ([place]) 09/23/2019   Ventricular tachycardia, polymorphic ([place]) 09/23/2019   Pelvic pain affecting pregnancy 10/15/2015   Supervision of normal pregnancy in third trimester 09/28/2015   Poor weight gain of pregnancy 09/28/2015   Iron deficiency anemia of pregnancy 09/01/2015   Increased BMI (body mass index) 07/29/2015    REFERRING DIAG: G93.1 (ICD-10-CM) - Anoxic brain injury ([place])  THERAPY DIAG:  Difficulty in walking, not elsewhere classified  Unsteadiness on feet  Muscle weakness (generalized)  Rationale for Evaluation and Treatment Rehabilitation  PERTINENT HISTORY: Patient is a 36 year old with diagnois of anoxic brain injury. She was receiving outpatient PT until Feb. 2022 last year but ran out of  visits. Patient has past medical history of Anoxic Brain Injury 09/2019, CAD, HLD, HTN, ISCHEMIC CARDIMYOPATHY, STEMI, V-FIB, ICD on 11/29/2020  .On 09/23/19 patient presented to ED 2 weeks postpartum (had emergent C-Section) with chest pain. In ED sustained VFib Cardiac Arrest requiring intubation. EKG showed ST elevation, Cath Lab showed aneurysmal dissection of the LAD was identified. Course complicated by encephalopathy, anoxic brain injury, enterobacterial PNA. Patient is currently living with mom. Mother reports she is abel to walk some  with a walker with supervision but primarily ambulates with HHA of mother who is her guardian. Mother also reports she requires assist for all transfers and ADL's.   PRECAUTIONS: Fall  SUBJECTIVE: Patient presents with her father this date.  Her father reports no significant changes since last session.  PAIN:  Are you having pain? No     TODAY'S TREATMENT:  10/28/21 The beginning exercises were performed in private treatment room to minimize distractions from environment   Transfer training with RW and cGA   Using blue hedge hog per pt favorite color (changed from red being favorite previously)  -march 10x; 2 sets  -LAQ  2 x 8 ea LE  each LE  wth goal of kicking hedgehog   Hip abduction to pressure of PT hands on hedgehog " pushing into red) x 10 ea LE   Ambulation x 150 feet  -x 2 rounds, on second round pt experienced fatigue and PT had to Mod A until father could position WC for sitting rest ( Father following in [facility])  Transfer training:  STS, 8 foot walk, then STS, repeat walk and sit in starting chair ( with seated rest with above therex between reps of 2 - RM, fatigue and increased difficulty on second rep compared to first    Pt required occasional rest breaks due fatigue, PT was quick to ask when pt appeared to be fatiguing in order to prevent excessive fatigue.  Patient continues to demonstrate significant distractions throughout session and difficulty paying attention to tasks.  PATIENT EDUCATION: Education details: HEP Person educated: Wellsite geologist: Consulting civil engineer, Media planner, Corporate treasurer cues, and Verbal cues Education comprehension: returned demonstration and needs further education   HOME EXERCISE PROGRAM: Access Code: [facility] URL: [email or web address] Date: 07/28/2021 Prepared by: Rivka Barbara  Exercises - Seated Long Arc Quad  - 1 x daily - 7 x weekly - 2 sets - 10 reps -Sit to stand- 1 x daily - 7 x weekly - 2 sets - 10  reps  Marching: 1 x daily - 7 x weekly - 2 sets - 10 reps   Access Code: [email or web address] URL: [email or web address] Date: 08/04/2021 Prepared by: Janna Arch  Exercises - Key Pinch with Putty  - 1 x daily - 7 x weekly - 2 sets - 10 reps - 5 hold - Hand Squeezes  - 1 x daily - 7 x weekly - 2 sets - 10 reps - 5 hold - Tip Pinch with Putty  - 1 x daily - 7 x weekly - 2 sets - 10 reps - 5 hold   PT Short Term Goals -       PT SHORT TERM GOAL #1   Title Patient will perform all transfers with Stand by assist using least restrictive AD,  BUE Support, and reaching back for safety to improve her functional independence and decrease fall risk.    Baseline 07/21/21: requires Min A and significant UE utilizaiton  9/14: SBA and UE use but completes safely.    Time 6  Period Weeks    Status Met, performing transfers to and from transport chair to armchair utilizing 7 upper extremity assist able to do it independently and safely with only standby assist from physical therapist             PT [place] #1   Title Patient/caregiver will report indepence with HEP with caregiver assistance.    Baseline 6/15: not copmelting HEP regularly/ father unsure of home program/ activities  9/14: been completing regularly    Time 8    Period Weeks    Status MET   Target Date 09/15/21      PT LONG TERM GOAL #2   Title Pt will decrease 5XSTS with UE use as needed by at least  15 seconds in order to demonstrate clinically significant improvement in LE strength.    Baseline 6/15: 61.54 sec with UE A 9/14: 25.01 sec, UE assist, LE trembling   Time 12    Period Weeks    Status ONGOING    Target Date 01/12/2022      PT LONG TERM GOAL #3   Title Pt will decrease TUG to below 34 seconds/decrease in order to demonstrate decreased fall risk.    Baseline 03/17/2021= 43.49 sec using RW 9/14: 40.6 sec    Time 12    Period Weeks    Status In ONGOING    Target Date  01/12/2022      PT LONG TERM GOAL #4   Title Patient will demo ability to ambulate > 400 ft w/ LRAD versus hand held assist min assist to demonstrate improved household mobility and short community distances.    Baseline 03/17/2021= 125 feet with Min A and RW and cues for proximity to walker 9/14: 225 ft in 3:44 sec    Time 12    Period Weeks    Status ONGOING    Target Date 01/12/2022      PT LONG TERM GOAL #5   Title Pt will improve FOTO to target score to display perceived improvements in ability to complete ADL's.    Baseline 6/15: no assessed secondary to father not knowing answers to questions, ptsent home with QR code for mother to complete survey at home.    Time 12    Period Weeks    Status Defer to next visit, QR code sent with father to give to mother for filling out. 9/14: pt mother performing at home between sessions 9/21: 47 ( 4 point increase)    Target Date 01/12/2022      PT LONG TERM GOAL #6   Title Pt will increase 10MWT by at least 0.15 m/s in order to demonstrate clinically significant improvement in community ambulation.    Baseline 03/17/2021= 0.24 m/s using RW and min A from PT for walker management  9/14: .56ms   Time 12    Period Weeks    Status ONGOING    Target Date 01/12/2022               Plan -     Clinical Impression Statement Patient continues to work toward her goals this session with fair motivation.  Patient progressed with lower extremity functional mobility with overall good response.  Patient did have significant fatigue with prolonged ambulation and required mod assist to prevent loss of balance that she tried to sit without chair posterior to her.  We did have a wheelchair follow throughout for safety.  Patient does make good progress with her transitional movements this session but does experience lower extremity fatigue with prolonged lower extremity use.  Patient will continue to benefit from lower extremity strengthening and mobility training  to improve her safety at home to improve her overall mobility.    Personal Factors and Comorbidities Behavior Pattern;Transportation;Comorbidity 3+    Comorbidities HTN, CAD, HLD    Examination-Activity Limitations Bed Mobility;Dressing;Hygiene/Grooming;Caring for Others;Stairs;Stand;Toileting;Transfers;Sit;Bathing;Continence;Lift;Squat;Reach Overhead    Examination-Participation Restrictions Occupation;Medication Management;Cleaning;Yard Work;Personal Finances;Driving    Stability/Clinical Decision Making Evolving/Moderate complexity    Rehab Potential Good    PT Frequency 1x / week    PT Duration 12 weeks    PT Treatment/Interventions ADLs/Self Care Home Management;Aquatic Therapy;Electrical Stimulation;DME Instruction;Gait training;Stair training;Functional mobility training;Therapeutic activities;Therapeutic exercise;Balance training;Neuromuscular re-education;Wheelchair mobility training;Patient/family education;Orthotic Fit/Training;Manual techniques;Passive range of motion;Cryotherapy;Moist Heat    PT Next Visit Plan Functional mobility training, LE strength and endurance    PT Home Exercise Plan Access Code: [email or web address]    Consulted and Agree with Plan of Care Patient;Family member/caregiver    Family Member Consulted Mom               Particia Lather, [place] 10/28/2021, 8:14 AM

## 2021-10-28 ENCOUNTER — Encounter: Payer: Self-pay | Admitting: Physical Therapy

## 2021-10-31 IMAGING — DX DG CHEST 1V PORT
1 series · 1 of 1 positions shown · non-contrast
Comparison: 09/24/2019

CLINICAL DATA: Intubated

EXAM:
PORTABLE CHEST 1 VIEW

[chest ap]
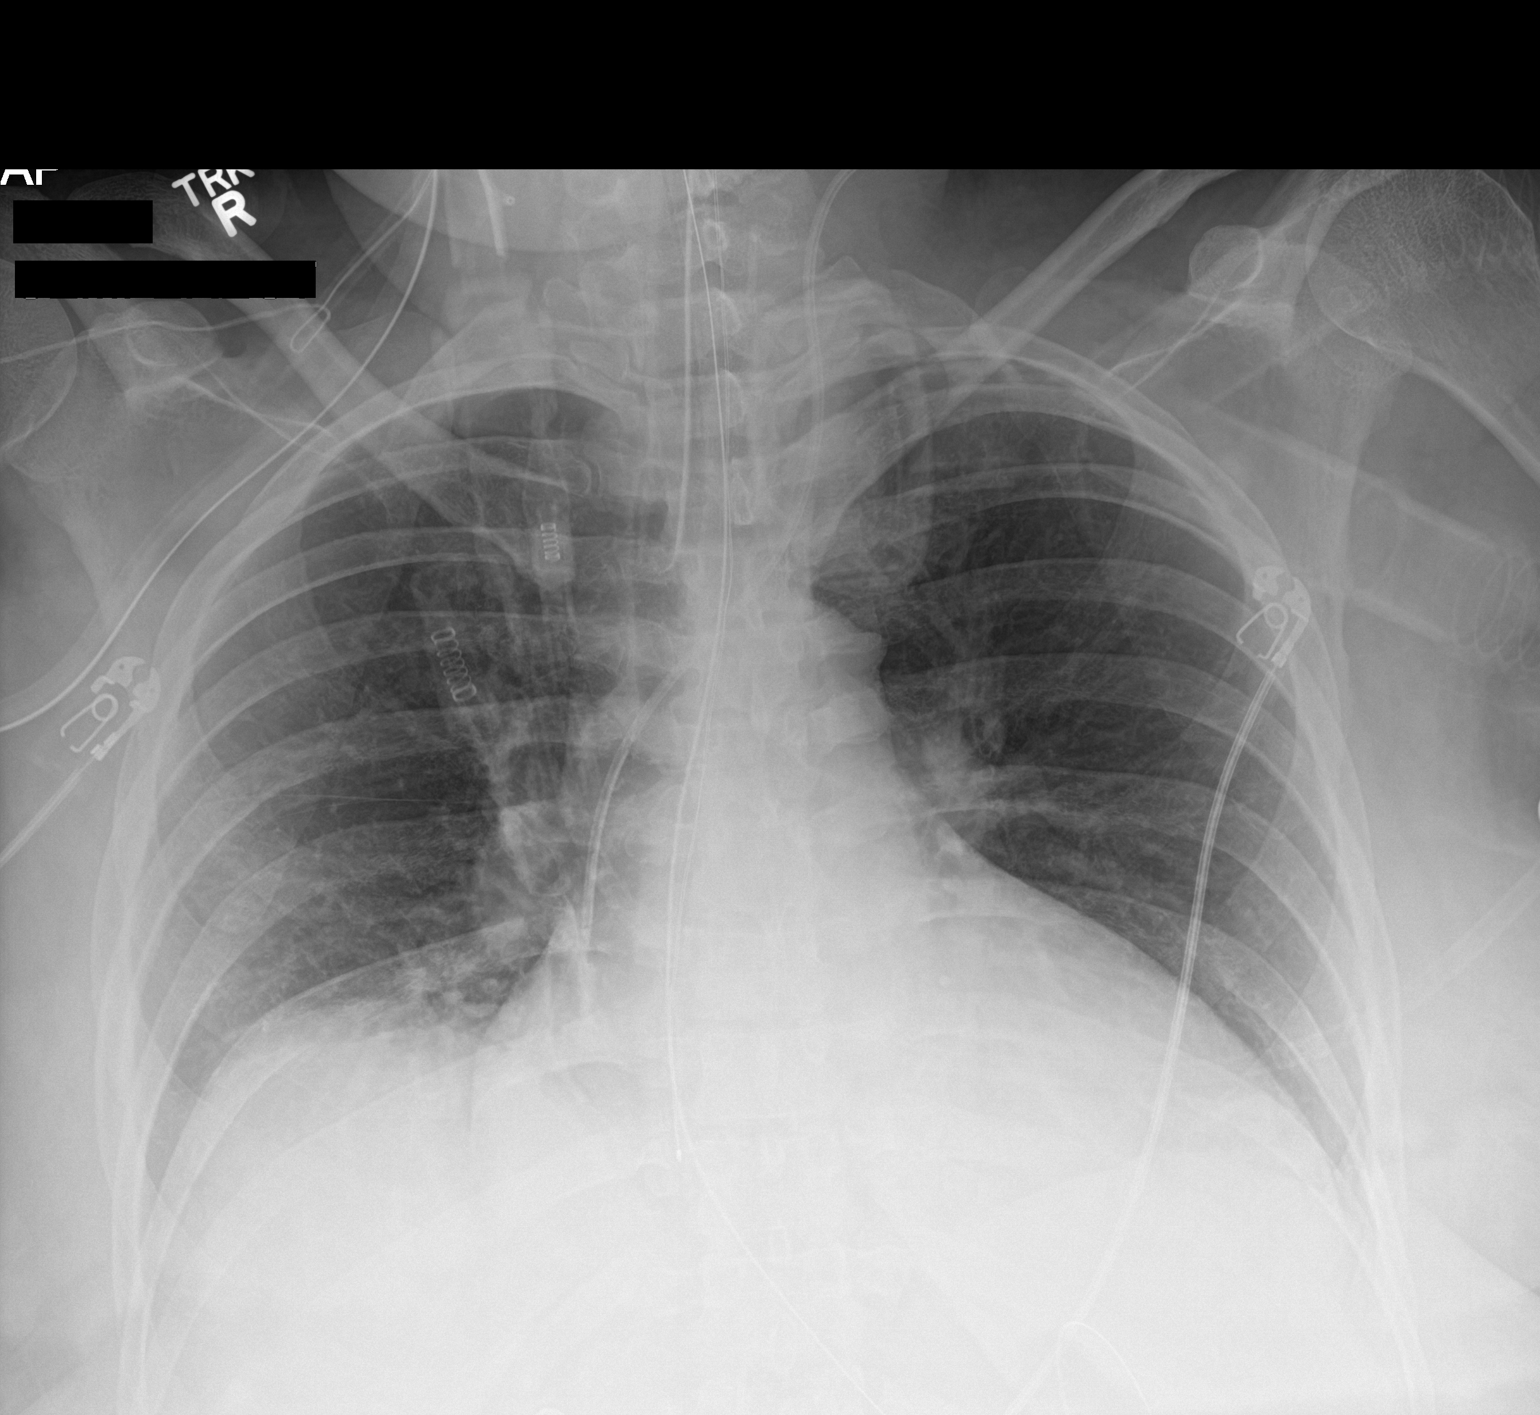

[1 of 1 positions shown; findings below may reference images not displayed]

FINDINGS: Unchanged support apparatus including endotracheal tube,
esophagogastric tube and left neck vascular catheter. Interval
improvement in diffuse interstitial and heterogeneous airspace
opacity. There may be small persistent layering pleural effusions
and or atelectasis. Cardiomegaly.
IMPRESSION: Interval improvement in diffuse interstitial and heterogeneous
airspace opacity. There may be small persistent layering pleural
effusions and or atelectasis. No new airspace opacity.

## 2021-10-31 IMAGING — DX DG ABD PORTABLE 1V
1 series · 1 of 1 positions shown · non-contrast
Comparison: 09/23/2019

CLINICAL DATA: Status post orogastric tube adjustment.

EXAM:
PORTABLE ABDOMEN - 1 VIEW

[abdomen kub]
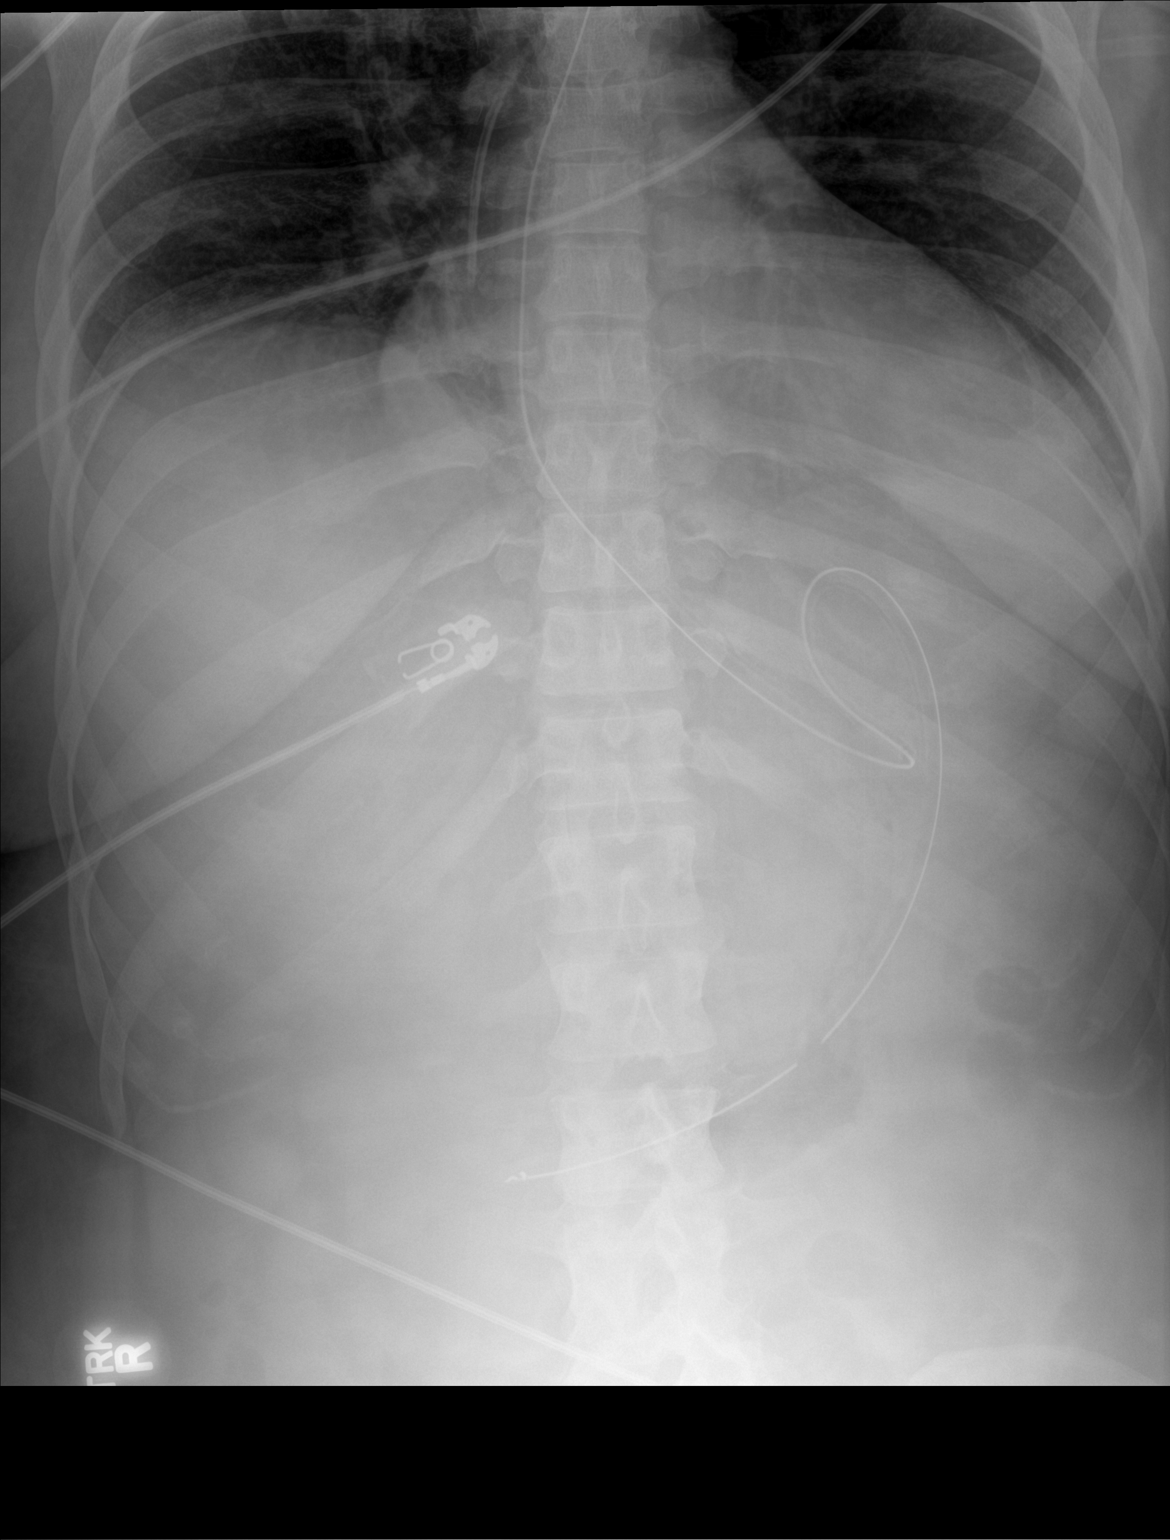

[1 of 1 positions shown; findings below may reference images not displayed]

FINDINGS: An enteric tube has been advanced and now terminates over the distal
aspect of the stomach with side hole projecting over the gastric
body. No dilated loops of bowel are seen in the included portion of
the abdomen to suggest obstruction. A central venous catheter
terminates near the superior cavoatrial junction. Mild left basilar
lung opacity is incompletely evaluated though may reflect
atelectasis.
IMPRESSION: Advancement of enteric tube as above.

## 2021-11-03 ENCOUNTER — Ambulatory Visit
Admission: RE | Admit: 2021-11-03 | Discharge: 2021-11-03 | Disposition: A | Discharge status: Home / Self Care | Payer: Medicaid Other | Source: Ambulatory Visit | Attending: Interventional Radiology | Admitting: Interventional Radiology

## 2021-11-03 ENCOUNTER — Ambulatory Visit: Payer: Medicaid Other | Admitting: Physical Therapy

## 2021-11-03 ENCOUNTER — Other Ambulatory Visit: Payer: Self-pay | Admitting: Interventional Radiology

## 2021-11-03 DIAGNOSIS — K9423 Gastrostomy malfunction: Secondary | ICD-10-CM

## 2021-11-03 HISTORY — PX: IR REPLC GASTRO/COLONIC TUBE PERCUT W/FLUORO: IMG2333

## 2021-11-03 MED ORDER — STERILE WATER FOR INJECTION IJ SOLN
INTRAMUSCULAR | Status: AC
  Administered 2021-11-03: 10 mL
  Filled 2021-11-03: qty 10

## 2021-11-03 MED ORDER — LIDOCAINE VISCOUS HCL 2 % MT SOLN
OROMUCOSAL | Status: AC
  Administered 2021-11-03: 5 mL
  Filled 2021-11-03: qty 15

## 2021-11-03 MED ORDER — IOHEXOL 300 MG/ML  SOLN
5.0000 mL | Freq: Once | INTRAMUSCULAR | Status: AC | PRN
  Administered 2021-11-03: 5 mL

## 2021-11-04 ENCOUNTER — Other Ambulatory Visit: Payer: Self-pay | Admitting: Interventional Radiology

## 2021-11-06 ENCOUNTER — Other Ambulatory Visit: Payer: Self-pay | Admitting: Physical Medicine & Rehabilitation

## 2021-11-06 DIAGNOSIS — R482 Apraxia: Secondary | ICD-10-CM

## 2021-11-10 ENCOUNTER — Ambulatory Visit: Payer: Medicaid Other | Attending: Physical Medicine & Rehabilitation | Admitting: Physical Therapy

## 2021-11-10 DIAGNOSIS — R262 Difficulty in walking, not elsewhere classified: Secondary | ICD-10-CM | POA: Diagnosis present

## 2021-11-10 DIAGNOSIS — R2689 Other abnormalities of gait and mobility: Secondary | ICD-10-CM

## 2021-11-10 DIAGNOSIS — M6281 Muscle weakness (generalized): Secondary | ICD-10-CM | POA: Diagnosis present

## 2021-11-10 DIAGNOSIS — R2681 Unsteadiness on feet: Secondary | ICD-10-CM | POA: Diagnosis present

## 2021-11-10 DIAGNOSIS — R269 Unspecified abnormalities of gait and mobility: Secondary | ICD-10-CM

## 2021-11-10 NOTE — Therapy (Signed)
OUTPATIENT PHYSICAL THERAPY TREATMENT NOTE/ /Physical Therapy Progress Note   Dates of reporting period  07/21/21   to   11/10/21     Patient Name: Beverly [name] MRN: [id number] DOB:[birth year], 36 y.o., female Today's Date: 11/10/2021  PCP: Ane Payment, [place] REFERRING PROVIDER: Meredith Staggers, MD   PT End of Session - 11/10/21 1434     Visit Number 10    Number of Visits 20    Date for PT Re-Evaluation 01/12/22    Authorization Type Medicaid approval- 47 visits for OT/PT/SLP combined    Authorization Time Period Auth: 9/14-12/6 12 visits    Authorization - Visit Number 10    Authorization - Number of Visits 12    Progress Note Due on Visit 10    PT Start Time 9169    PT Stop Time 1514    PT Time Calculation (min) 40 min    Equipment Utilized During Treatment Gait belt    Activity Tolerance Patient tolerated treatment well    Behavior During Therapy Impulsive                   Past Medical History:  Diagnosis Date   Brain injury ([place])    CAD (coronary artery disease)    HLD (hyperlipidemia)    Hypertension    last pregnancy   Ischemic cardiomyopathy    STEMI (ST elevation myocardial infarction) ([place]) 09/2019   SCAD with aneurysmal dilation of proximal LAD.   Vaginal Pap smear, abnormal    when she was 36yo   Ventricular fibrillation ([place]) 09/23/2019   Past Surgical History:  Procedure Laterality Date   CARDIAC CATHETERIZATION     IR GASTROSTOMY TUBE MOD SED  10/07/2019   IR GASTROSTOMY TUBE REMOVAL  07/02/2020   IR [place] GASTRO/COLONIC TUBE PERCUT W/FLUORO  11/03/2021   LEFT HEART CATH AND CORONARY ANGIOGRAPHY N/A 09/23/2019   Procedure: LEFT HEART CATH AND CORONARY ANGIOGRAPHY;  Surgeon: Nelva Bush, MD;  Location: [facility];  Service: Cardiovascular;  Laterality: N/A;   SUBQ ICD IMPLANT N/A 11/26/2020   Procedure: SUBQ ICD IMPLANT;  Surgeon: Vickie Epley, MD;  Location: [facility];  Service:  Cardiovascular;  Laterality: N/A;   Patient Active Problem List   Diagnosis Date Noted   Apraxia 10/05/2021   S/P ICD (internal cardiac defibrillator) procedure 11/27/2020   Hx of Cardiac arrest ([place]) 11/26/2020   Abnormality of gait 03/16/2020   Sleep disturbance 03/16/2020   Oropharyngeal dysphagia 02/12/2020   Coronary artery disease involving native coronary artery of native heart without angina pectoris 12/12/2019   Ischemic cardiomyopathy 12/12/2019   Brain injury ([place])    Prediabetes    Anoxic brain injury ([place]) 11/05/2019   Dysphagia 11/05/2019   Physical deconditioning 11/05/2019   Acute delirium 11/05/2019   Respiratory failure ([place])    Encounter for central line placement    Ventricular fibrillation ([place]) 09/23/2019   Acute combined systolic and diastolic heart failure ([place]) 09/23/2019   Ventricular tachycardia, polymorphic ([place]) 09/23/2019   Pelvic pain affecting pregnancy 10/15/2015   Supervision of normal pregnancy in third trimester 09/28/2015   Poor weight gain of pregnancy 09/28/2015   Iron deficiency anemia of pregnancy 09/01/2015   Increased BMI (body mass index) 07/29/2015    REFERRING DIAG: G93.1 (ICD-10-CM) - Anoxic brain injury ([place])  THERAPY DIAG:  Difficulty in walking, not elsewhere classified  Unsteadiness on feet  Muscle weakness (generalized)  Abnormality of gait and mobility  Other abnormalities  of gait and mobility  Rationale for Evaluation and Treatment Rehabilitation  PERTINENT HISTORY: Patient is a 36 year old with diagnois of anoxic brain injury. She was receiving outpatient PT until Feb. 2022 last year but ran out of visits. Patient has past medical history of Anoxic Brain Injury 09/2019, CAD, HLD, HTN, ISCHEMIC CARDIMYOPATHY, STEMI, V-FIB, ICD on 11/29/2020  .On 09/23/19 patient presented to ED 2 weeks postpartum (had emergent C-Section) with chest pain. In ED sustained VFib Cardiac Arrest requiring intubation. EKG showed ST elevation,  Cath Lab showed aneurysmal dissection of the LAD was identified. Course complicated by encephalopathy, anoxic brain injury, enterobacterial PNA. Patient is currently living with mom. Mother reports she is abel to walk some with a walker with supervision but primarily ambulates with HHA of mother who is her guardian. Mother also reports she requires assist for all transfers and ADL's.   PRECAUTIONS: Fall  SUBJECTIVE: Patient presents with her father this date.  Her father reports her feeding tube was leaking last week but it has been fixed and is doing well now.   PAIN:  Are you having pain? No     TODAY'S TREATMENT:  11/10/21 The beginning exercises were performed in private treatment room to minimize distractions from environment      Transfer training:  STS, 8 foot walk, then STS, repeat walk and sit in starting chair ( with seated rest with below therex between reach rep, completes 5 reps - RW, fatigue and increased difficulty on second rep compared to first  -Difficulty with movement sequencing continued but improved safety and instruction following this date.   Using red  hedge hog per pt favorite color (changed from red being favorite previously)  -march 10x; 2 sets completed sets between sit, stand and ambulate and sit to stand transfer to allow rest and prevent excessive fatigue    -LAQ  2 x 10 ea LE  each LE  wth goal of kicking hedgehog, cues   Hip abduction to pressure of PT hands on hedgehog " pushing into red) x 10 ea LE   Hip adduction seated into ball x 10 with intermittent holds.   Ambulation x 120 feet  X 1 rounds, pt attempts to sit in floor when fatigued, father instructed in proper WC follow and technique when pt gets tired.   Pt required occasional rest breaks due fatigue, PT was quick to ask when pt appeared to be fatiguing in order to prevent excessive fatigue.    PATIENT EDUCATION: Education details: HEP Person educated: Wellsite geologist:  Consulting civil engineer, Media planner, Corporate treasurer cues, and Verbal cues Education comprehension: returned demonstration and needs further education   HOME EXERCISE PROGRAM: Access Code: [facility] URL: [email or web address] Date: 07/28/2021 Prepared by: Rivka Barbara  Exercises - Seated Long Arc Quad  - 1 x daily - 7 x weekly - 2 sets - 10 reps -Sit to stand- 1 x daily - 7 x weekly - 2 sets - 10 reps  Marching: 1 x daily - 7 x weekly - 2 sets - 10 reps   Access Code: [email or web address] URL: [email or web address] Date: 08/04/2021 Prepared by: Janna Arch  Exercises - Key Pinch with Putty  - 1 x daily - 7 x weekly - 2 sets - 10 reps - 5 hold - Hand Squeezes  - 1 x daily - 7 x weekly - 2 sets - 10 reps - 5 hold - Tip Pinch with Putty  - 1 x daily - 7 x weekly - 2 sets - 10 reps -  5 hold   PT Short Term Goals -       PT SHORT TERM GOAL #1   Title Patient will perform all transfers with Stand by assist using least restrictive AD,  BUE Support, and reaching back for safety to improve her functional independence and decrease fall risk.    Baseline 07/21/21: requires Min A and significant UE utilizaiton  9/14: SBA and UE use but completes safely.    Time 6    Period Weeks    Status Met, performing transfers to and from transport chair to armchair utilizing 7 upper extremity assist able to do it independently and safely with only standby assist from physical therapist             PT [place] #1   Title Patient/caregiver will report indepence with HEP with caregiver assistance.    Baseline 6/15: not copmelting HEP regularly/ father unsure of home program/ activities  9/14: been completing regularly    Time 8    Period Weeks    Status MET   Target Date 09/15/21      PT LONG TERM GOAL #2   Title Pt will decrease 5XSTS with UE use as needed by at least  15 seconds in order to demonstrate clinically significant improvement in LE strength.     Baseline 6/15: 61.54 sec with UE A 9/14: 25.01 sec, UE assist, LE trembling   Time 12    Period Weeks    Status ONGOING    Target Date 01/12/2022      PT LONG TERM GOAL #3   Title Pt will decrease TUG to below 34 seconds/decrease in order to demonstrate decreased fall risk.    Baseline 03/17/2021= 43.49 sec using RW 9/14: 40.6 sec    Time 12    Period Weeks    Status In ONGOING    Target Date 01/12/2022      PT LONG TERM GOAL #4   Title Patient will demo ability to ambulate > 400 ft w/ LRAD versus hand held assist min assist to demonstrate improved household mobility and short community distances.    Baseline 03/17/2021= 125 feet with Min A and RW and cues for proximity to walker 9/14: 225 ft in 3:44 sec    Time 12    Period Weeks    Status ONGOING    Target Date 01/12/2022      PT LONG TERM GOAL #5   Title Pt will improve FOTO to target score to display perceived improvements in ability to complete ADL's.    Baseline 6/15: no assessed secondary to father not knowing answers to questions, ptsent home with QR code for mother to complete survey at home.    Time 12    Period Weeks    Status Defer to next visit, QR code sent with father to give to mother for filling out. 9/14: pt mother performing at home between sessions 9/21: 47 ( 4 point increase)    Target Date 01/12/2022      PT LONG TERM GOAL #6   Title Pt will increase 10MWT by at least 0.15 m/s in order to demonstrate clinically significant improvement in community ambulation.    Baseline 03/17/2021= 0.24 m/s using RW and min A from PT for walker management  9/14: .24ms   Time 12    Period Weeks    Status ONGOING    Target Date 01/12/2022  Plan -     Clinical Impression Statement Patient presents to physical therapy for progress note this date.  Goals were assessed to visits ago from note on 10/20/2021 see below clinical summary from there for update on patient's status with goals.  Patient continues to progress  with lower extremity functional strengthening and activities particularly safety with transfers.  Patient also demonstrates improved ability to follow instructions and perform seated TherEX for improvement in lower extremity strength and endurance this date.  Patient does require frequent and significant verbal, tactile, and visual cues for proper performance of exercises.Pt will continue to benefit from skilled physical therapy intervention to address impairments, improve QOL, and attain therapy goals.    From 10/20/21 note ( 2 visits ago) Patient making good progress toward all of her goals indicating improved overall function and decreased risk of falls.  Patient able to ambulate increased distance this session indicating improved function ambulation in the community.  Patient also able to demonstrate improved 5 times sit to stand as well as timed up and go indicating both improved ability to perform transitional movements as well as decrease risk of falls.  The patient making progress toward all her goals patient still progressing toward her goals and will continue to benefit from skilled physical therapy to further improve her lower extremity strength, coordination, endurance, overall function, and to prevent falls and improve her quality of life.Patient's condition has the potential to improve in response to therapy. Maximum improvement is yet to be obtained. The anticipated improvement is attainable and reasonable in a generally predictable time.     Personal Factors and Comorbidities Behavior Pattern;Transportation;Comorbidity 3+    Comorbidities HTN, CAD, HLD    Examination-Activity Limitations Bed Mobility;Dressing;Hygiene/Grooming;Caring for Others;Stairs;Stand;Toileting;Transfers;Sit;Bathing;Continence;Lift;Squat;Reach Overhead    Examination-Participation Restrictions Occupation;Medication Management;Cleaning;Yard Work;Personal Finances;Driving    Stability/Clinical Decision Making  Evolving/Moderate complexity    Rehab Potential Good    PT Frequency 1x / week    PT Duration 12 weeks    PT Treatment/Interventions ADLs/Self Care Home Management;Aquatic Therapy;Electrical Stimulation;DME Instruction;Gait training;Stair training;Functional mobility training;Therapeutic activities;Therapeutic exercise;Balance training;Neuromuscular re-education;Wheelchair mobility training;Patient/family education;Orthotic Fit/Training;Manual techniques;Passive range of motion;Cryotherapy;Moist Heat    PT Next Visit Plan Functional mobility training, LE strength and endurance    PT Home Exercise Plan Access Code: [email or web address]    Consulted and Agree with Plan of Care Patient;Family member/caregiver    Family Member Terramuggus B Madigan [place], [place] 11/10/2021, 2:35 PM

## 2021-11-12 ENCOUNTER — Other Ambulatory Visit: Payer: Self-pay | Admitting: Physical Medicine & Rehabilitation

## 2021-11-12 DIAGNOSIS — R1312 Dysphagia, oropharyngeal phase: Secondary | ICD-10-CM

## 2021-11-12 DIAGNOSIS — G931 Anoxic brain damage, not elsewhere classified: Secondary | ICD-10-CM

## 2021-11-12 DIAGNOSIS — R482 Apraxia: Secondary | ICD-10-CM

## 2021-11-14 MED ORDER — METHYLPHENIDATE HCL 10 MG PO TABS: 15.0000 mg | ORAL_TABLET | Freq: Two times a day (BID) | ORAL | 0 refills | Status: DC

## 2021-11-17 ENCOUNTER — Ambulatory Visit: Payer: Medicaid Other | Admitting: Physical Therapy

## 2021-11-17 DIAGNOSIS — R269 Unspecified abnormalities of gait and mobility: Secondary | ICD-10-CM

## 2021-11-17 DIAGNOSIS — R2689 Other abnormalities of gait and mobility: Secondary | ICD-10-CM

## 2021-11-17 DIAGNOSIS — R262 Difficulty in walking, not elsewhere classified: Secondary | ICD-10-CM | POA: Diagnosis not present

## 2021-11-17 DIAGNOSIS — M6281 Muscle weakness (generalized): Secondary | ICD-10-CM

## 2021-11-17 DIAGNOSIS — R2681 Unsteadiness on feet: Secondary | ICD-10-CM

## 2021-11-17 NOTE — Therapy (Signed)
OUTPATIENT PHYSICAL THERAPY TREATMENT NOTE      Patient Name: Beverly [name] MRN: [id number] DOB:04/12/1985, 36 y.o., female Today's Date: 11/17/2021  PCP: Ane Payment, [place] REFERRING PROVIDER: Meredith Staggers, MD   PT End of Session - 11/17/21 1459     Visit Number 11    Number of Visits 20    Date for PT Re-Evaluation 01/12/22    Authorization Type Medicaid approval- 21 visits for OT/PT/SLP combined    Authorization Time Period Auth: 9/14-12/6 12 visits    Authorization - Visit Number 43    Authorization - Number of Visits 12    Progress Note Due on Visit 20    Equipment Utilized During Treatment Gait belt    Activity Tolerance Patient tolerated treatment well    Behavior During Therapy Impulsive                    Past Medical History:  Diagnosis Date   Brain injury ([place])    CAD (coronary artery disease)    HLD (hyperlipidemia)    Hypertension    last pregnancy   Ischemic cardiomyopathy    STEMI (ST elevation myocardial infarction) ([place]) 09/2019   SCAD with aneurysmal dilation of proximal LAD.   Vaginal Pap smear, abnormal    when she was 36yo   Ventricular fibrillation ([place]) 09/23/2019   Past Surgical History:  Procedure Laterality Date   CARDIAC CATHETERIZATION     IR GASTROSTOMY TUBE MOD SED  10/07/2019   IR GASTROSTOMY TUBE REMOVAL  07/02/2020   IR [place] GASTRO/COLONIC TUBE PERCUT W/FLUORO  11/03/2021   LEFT HEART CATH AND CORONARY ANGIOGRAPHY N/A 09/23/2019   Procedure: LEFT HEART CATH AND CORONARY ANGIOGRAPHY;  Surgeon: Nelva Bush, MD;  Location: [facility];  Service: Cardiovascular;  Laterality: N/A;   SUBQ ICD IMPLANT N/A 11/26/2020   Procedure: SUBQ ICD IMPLANT;  Surgeon: Vickie Epley, MD;  Location: [facility];  Service: Cardiovascular;  Laterality: N/A;   Patient Active Problem List   Diagnosis Date Noted   Apraxia 10/05/2021   S/P ICD (internal cardiac defibrillator) procedure 11/27/2020    Hx of Cardiac arrest ([place]) 11/26/2020   Abnormality of gait 03/16/2020   Sleep disturbance 03/16/2020   Oropharyngeal dysphagia 02/12/2020   Coronary artery disease involving native coronary artery of native heart without angina pectoris 12/12/2019   Ischemic cardiomyopathy 12/12/2019   Brain injury ([place])    Prediabetes    Anoxic brain injury ([place]) 11/05/2019   Dysphagia 11/05/2019   Physical deconditioning 11/05/2019   Acute delirium 11/05/2019   Respiratory failure ([place])    Encounter for central line placement    Ventricular fibrillation ([place]) 09/23/2019   Acute combined systolic and diastolic heart failure ([place]) 09/23/2019   Ventricular tachycardia, polymorphic ([place]) 09/23/2019   Pelvic pain affecting pregnancy 10/15/2015   Supervision of normal pregnancy in third trimester 09/28/2015   Poor weight gain of pregnancy 09/28/2015   Iron deficiency anemia of pregnancy 09/01/2015   Increased BMI (body mass index) 07/29/2015    REFERRING DIAG: G93.1 (ICD-10-CM) - Anoxic brain injury ([place])  THERAPY DIAG:  Difficulty in walking, not elsewhere classified  Unsteadiness on feet  Muscle weakness (generalized)  Abnormality of gait and mobility  Other abnormalities of gait and mobility  Rationale for Evaluation and Treatment Rehabilitation  PERTINENT HISTORY: Patient is a 36 year old with diagnois of anoxic brain injury. She was receiving outpatient PT until Feb. 2022 last year but ran out  of visits. Patient has past medical history of Anoxic Brain Injury 09/2019, CAD, HLD, HTN, ISCHEMIC CARDIMYOPATHY, STEMI, V-FIB, ICD on 11/29/2020  .On 09/23/19 patient presented to ED 2 weeks postpartum (had emergent C-Section) with chest pain. In ED sustained VFib Cardiac Arrest requiring intubation. EKG showed ST elevation, Cath Lab showed aneurysmal dissection of the LAD was identified. Course complicated by encephalopathy, anoxic brain injury, enterobacterial PNA. Patient is currently living with  mom. Mother reports she is abel to walk some with a walker with supervision but primarily ambulates with HHA of mother who is her guardian. Mother also reports she requires assist for all transfers and ADL's.   PRECAUTIONS: Fall  SUBJECTIVE: Patient presents with her father this date.  Patient probably reports no significant changes from last session PAIN:  Are you having pain? No     TODAY'S TREATMENT:  11/17/21     TA Transfer training:  STS, 8 foot walk, then STS, repeat walk and sit in starting chair ( with seated rest with below therex between reach rep, completes 4 reps - RW, fatigue and increased difficulty on second rep compared to first  -Difficulty with movement sequencing continued but improved safety and instruction following this date.  TE Using red  hedge hog per pt favorite color (changed from red being favorite previously)  -march 10x;sets completed sets between sit, stand and ambulate and sit to stand transfer to allow rest and prevent excessive fatigue    -LAQ   x 10 ea LE  each LE  wth goal of kicking hedgehog, cues   Hip abduction to pressure of PT hands on hedgehog " pushing into red) x 10 ea LE   Hip adduction seated into ball x 10 with intermittent holds.   TA  STS with PT anterior with cues for UE assist on armchair and for cues for anterior weight shift and for ensuring proper foot positioning.  X6 repetitions  Ambulation x 190 feet  X 1 rounds, pt attempts to sit in floor when fatigued, father instructed in proper WC follow and technique when pt gets tired. Father places WC in correct and pt able to sit with good efficacy ( less of attemtp to sit in floor this date compared to previous sessions.   Pt required occasional rest breaks due fatigue, PT was quick to ask when pt appeared to be fatiguing in order to prevent excessive fatigue.    PATIENT EDUCATION: Education details: HEP Person educated: Wellsite geologist: Consulting civil engineer,  Media planner, Corporate treasurer cues, and Verbal cues Education comprehension: returned demonstration and needs further education   HOME EXERCISE PROGRAM: Access Code: [facility] URL: [email or web address] Date: 07/28/2021 Prepared by: Rivka Barbara  Exercises - Seated Long Arc Quad  - 1 x daily - 7 x weekly - 2 sets - 10 reps -Sit to stand- 1 x daily - 7 x weekly - 2 sets - 10 reps  Marching: 1 x daily - 7 x weekly - 2 sets - 10 reps   Access Code: [email or web address] URL: [email or web address] Date: 08/04/2021 Prepared by: Janna Arch  Exercises - Key Pinch with Putty  - 1 x daily - 7 x weekly - 2 sets - 10 reps - 5 hold - Hand Squeezes  - 1 x daily - 7 x weekly - 2 sets - 10 reps - 5 hold - Tip Pinch with Putty  - 1 x daily - 7 x weekly - 2 sets - 10 reps - 5 hold   PT Short Term Goals -  PT SHORT TERM GOAL #1   Title Patient will perform all transfers with Stand by assist using least restrictive AD,  BUE Support, and reaching back for safety to improve her functional independence and decrease fall risk.    Baseline 07/21/21: requires Min A and significant UE utilizaiton  9/14: SBA and UE use but completes safely.    Time 6    Period Weeks    Status Met, performing transfers to and from transport chair to armchair utilizing 7 upper extremity assist able to do it independently and safely with only standby assist from physical therapist             PT [place] #1   Title Patient/caregiver will report indepence with HEP with caregiver assistance.    Baseline 6/15: not copmelting HEP regularly/ father unsure of home program/ activities  9/14: been completing regularly    Time 8    Period Weeks    Status MET   Target Date 09/15/21      PT LONG TERM GOAL #2   Title Pt will decrease 5XSTS with UE use as needed by at least  15 seconds in order to demonstrate clinically significant improvement in LE strength.    Baseline  6/15: 61.54 sec with UE A 9/14: 25.01 sec, UE assist, LE trembling   Time 12    Period Weeks    Status ONGOING    Target Date 01/12/2022      PT LONG TERM GOAL #3   Title Pt will decrease TUG to below 34 seconds/decrease in order to demonstrate decreased fall risk.    Baseline 03/17/2021= 43.49 sec using RW 9/14: 40.6 sec    Time 12    Period Weeks    Status In ONGOING    Target Date 01/12/2022      PT LONG TERM GOAL #4   Title Patient will demo ability to ambulate > 400 ft w/ LRAD versus hand held assist min assist to demonstrate improved household mobility and short community distances.    Baseline 03/17/2021= 125 feet with Min A and RW and cues for proximity to walker 9/14: 225 ft in 3:44 sec    Time 12    Period Weeks    Status ONGOING    Target Date 01/12/2022      PT LONG TERM GOAL #5   Title Pt will improve FOTO to target score to display perceived improvements in ability to complete ADL's.    Baseline 6/15: no assessed secondary to father not knowing answers to questions, ptsent home with QR code for mother to complete survey at home.    Time 12    Period Weeks    Status Defer to next visit, QR code sent with father to give to mother for filling out. 9/14: pt mother performing at home between sessions 9/21: 47 ( 4 point increase)    Target Date 01/12/2022      PT LONG TERM GOAL #6   Title Pt will increase 10MWT by at least 0.15 m/s in order to demonstrate clinically significant improvement in community ambulation.    Baseline 03/17/2021= 0.24 m/s using RW and min A from PT for walker management  9/14: .54ms   Time 12    Period Weeks    Status ONGOING    Target Date 01/12/2022               Plan -  Clinical Impression Statement Patient presented physical therapy with father as well as with fair motivation for completion of physical therapy activities.  Patient retrace improvement in ability to perform transfers as evidenced by improved efficacy with utilizing walker  and to properly back up to chair with each transfer this date as well as with decreased cueing required.  Patient also demonstrates improved ability to ambulate prolonged distance but still has significant fatigue requiring her to need to sit down towards end of ambulatory bouts.  Patient will continue to benefit from skilled physical therapy to improve her lower extremity strength, mobility, balance, prevent falls, and improve her quality of life.     Personal Factors and Comorbidities Behavior Pattern;Transportation;Comorbidity 3+    Comorbidities HTN, CAD, HLD    Examination-Activity Limitations Bed Mobility;Dressing;Hygiene/Grooming;Caring for Others;Stairs;Stand;Toileting;Transfers;Sit;Bathing;Continence;Lift;Squat;Reach Overhead    Examination-Participation Restrictions Occupation;Medication Management;Cleaning;Yard Work;Personal Finances;Driving    Stability/Clinical Decision Making Evolving/Moderate complexity    Rehab Potential Good    PT Frequency 1x / week    PT Duration 12 weeks    PT Treatment/Interventions ADLs/Self Care Home Management;Aquatic Therapy;Electrical Stimulation;DME Instruction;Gait training;Stair training;Functional mobility training;Therapeutic activities;Therapeutic exercise;Balance training;Neuromuscular re-education;Wheelchair mobility training;Patient/family education;Orthotic Fit/Training;Manual techniques;Passive range of motion;Cryotherapy;Moist Heat    PT Next Visit Plan Functional mobility training, LE strength and endurance    PT Home Exercise Plan Access Code: [email or web address]    Consulted and Agree with Plan of Care Patient;Family member/caregiver    Family Member [place] B Bascom Biel, [place] 11/17/2021, 2:59 PM

## 2021-11-23 NOTE — Therapy (Unsigned)
OUTPATIENT PHYSICAL THERAPY TREATMENT NOTE      Patient Name: Beverly [name] MRN: [id number] DOB:04-Feb-1986, 36 y.o., female Today's Date: 11/24/2021  PCP: Ane Payment, [place] REFERRING PROVIDER: Meredith Staggers, MD   PT End of Session - 11/24/21 1440     Visit Number 12    Number of Visits 20    Date for PT Re-Evaluation 01/12/22    Authorization Type Medicaid approval- 46 visits for OT/PT/SLP combined    Authorization Time Period Auth: 9/14-12/6 12 visits    Authorization - Visit Number 5    Authorization - Number of Visits 12   reset with recent medicaid auth   Progress Note Due on Visit 20    PT Start Time 1435    PT Stop Time 1513    PT Time Calculation (min) 38 min    Equipment Utilized During Treatment Gait belt    Activity Tolerance Patient tolerated treatment well    Behavior During Therapy Impulsive                     Past Medical History:  Diagnosis Date   Brain injury ([place])    CAD (coronary artery disease)    HLD (hyperlipidemia)    Hypertension    last pregnancy   Ischemic cardiomyopathy    STEMI (ST elevation myocardial infarction) ([place]) 09/2019   SCAD with aneurysmal dilation of proximal LAD.   Vaginal Pap smear, abnormal    when she was 36yo   Ventricular fibrillation ([place]) 09/23/2019   Past Surgical History:  Procedure Laterality Date   CARDIAC CATHETERIZATION     IR GASTROSTOMY TUBE MOD SED  10/07/2019   IR GASTROSTOMY TUBE REMOVAL  07/02/2020   IR [place] GASTRO/COLONIC TUBE PERCUT W/FLUORO  11/03/2021   LEFT HEART CATH AND CORONARY ANGIOGRAPHY N/A 09/23/2019   Procedure: LEFT HEART CATH AND CORONARY ANGIOGRAPHY;  Surgeon: Nelva Bush, MD;  Location: [facility];  Service: Cardiovascular;  Laterality: N/A;   SUBQ ICD IMPLANT N/A 11/26/2020   Procedure: SUBQ ICD IMPLANT;  Surgeon: Vickie Epley, MD;  Location: [facility];  Service: Cardiovascular;  Laterality: N/A;   Patient Active Problem  List   Diagnosis Date Noted   Apraxia 10/05/2021   S/P ICD (internal cardiac defibrillator) procedure 11/27/2020   Hx of Cardiac arrest ([place]) 11/26/2020   Abnormality of gait 03/16/2020   Sleep disturbance 03/16/2020   Oropharyngeal dysphagia 02/12/2020   Coronary artery disease involving native coronary artery of native heart without angina pectoris 12/12/2019   Ischemic cardiomyopathy 12/12/2019   Brain injury ([place])    Prediabetes    Anoxic brain injury ([place]) 11/05/2019   Dysphagia 11/05/2019   Physical deconditioning 11/05/2019   Acute delirium 11/05/2019   Respiratory failure ([place])    Encounter for central line placement    Ventricular fibrillation ([place]) 09/23/2019   Acute combined systolic and diastolic heart failure ([place]) 09/23/2019   Ventricular tachycardia, polymorphic ([place]) 09/23/2019   Pelvic pain affecting pregnancy 10/15/2015   Supervision of normal pregnancy in third trimester 09/28/2015   Poor weight gain of pregnancy 09/28/2015   Iron deficiency anemia of pregnancy 09/01/2015   Increased BMI (body mass index) 07/29/2015    REFERRING DIAG: G93.1 (ICD-10-CM) - Anoxic brain injury ([place])  THERAPY DIAG:  Difficulty in walking, not elsewhere classified  Unsteadiness on feet  Muscle weakness (generalized)  Abnormality of gait and mobility  Other abnormalities of gait and mobility  Rationale for Evaluation and  Treatment Rehabilitation  PERTINENT HISTORY: Patient is a 36 year old with diagnois of anoxic brain injury. She was receiving outpatient PT until Feb. 2022 last year but ran out of visits. Patient has past medical history of Anoxic Brain Injury 09/2019, CAD, HLD, HTN, ISCHEMIC CARDIMYOPATHY, STEMI, V-FIB, ICD on 11/29/2020  .On 09/23/19 patient presented to ED 2 weeks postpartum (had emergent C-Section) with chest pain. In ED sustained VFib Cardiac Arrest requiring intubation. EKG showed ST elevation, Cath Lab showed aneurysmal dissection of the LAD was  identified. Course complicated by encephalopathy, anoxic brain injury, enterobacterial PNA. Patient is currently living with mom. Mother reports she is abel to walk some with a walker with supervision but primarily ambulates with HHA of mother who is her guardian. Mother also reports she requires assist for all transfers and ADL's.   PRECAUTIONS: Fall  SUBJECTIVE: Patient presents with her father this date.  Patient reports  father no significant changes from last session to his knowledge.   PAIN:  Are you having pain? No     TODAY'S TREATMENT:  11/24/21     TA Transfer training:  STS, 8 foot walk, then STS, repeat walk and sit in starting chair  -seated rest at starting chair with seated therex as below  - RW, fatigue and increased difficulty on second rep compared to first  -Difficulty with movement sequencing continued but improved safety and instruction following this date.  -Pt also has difficulty with LE placement with this activity - x 10 total STS transitions and ambulation  TE Using red  hedge hog per pt favorite color (changed from red being favorite previously)  -march 10x; 2 sets completed sets  of 3 STS and ambulation   -LAQ   2x 10 ea LE  each LE  wth goal of kicking hedgehog, cues   Hip abduction to pressure of PT hands on hedgehog " pushing into red) x 10 ea LE   Hip adduction seated into ball x 10 with intermittent holds.   TA  STS with PT anterior with cues for UE assist on armchair and max cues f for anterior weight shift and for ensuring proper foot positioning.  X4 repetitions. Encouraged more repetitions but pt unable to copmlete at this time, hard to distinguish between lack of understanding of direction or fatigue at this time for reason why more STS could not be performed. Pt makes same response to whether she is tired, needs a break or if she is good and can do more.   Ambulation x 100 feet  X 1 rounds, pt attempts to sit in floor when fatigued,  father instructed in proper WC follow and technique when pt gets tired. Father places WC in correct and pt able to sit with good efficacy ( less of attemtp to sit in floor this date compared to previous sessions)   Pt required occasional rest breaks due fatigue, PT was quick to ask when pt appeared to be fatiguing in order to prevent excessive fatigue.    PATIENT EDUCATION: Education details: HEP Person educated: Wellsite geologist: Consulting civil engineer, Media planner, Corporate treasurer cues, and Verbal cues Education comprehension: returned demonstration and needs further education   HOME EXERCISE PROGRAM: Access Code: [facility] URL: [email or web address] Date: 07/28/2021 Prepared by: Rivka Barbara  Exercises - Seated Long Arc Quad  - 1 x daily - 7 x weekly - 2 sets - 10 reps -Sit to stand- 1 x daily - 7 x weekly - 2 sets - 10 reps  Marching: 1 x  daily - 7 x weekly - 2 sets - 10 reps   Access Code: [email or web address] URL: [email or web address] Date: 08/04/2021 Prepared by: Janna Arch  Exercises - Key Pinch with Putty  - 1 x daily - 7 x weekly - 2 sets - 10 reps - 5 hold - Hand Squeezes  - 1 x daily - 7 x weekly - 2 sets - 10 reps - 5 hold - Tip Pinch with Putty  - 1 x daily - 7 x weekly - 2 sets - 10 reps - 5 hold   PT Short Term Goals -       PT SHORT TERM GOAL #1   Title Patient will perform all transfers with Stand by assist using least restrictive AD,  BUE Support, and reaching back for safety to improve her functional independence and decrease fall risk.    Baseline 07/21/21: requires Min A and significant UE utilizaiton  9/14: SBA and UE use but completes safely.    Time 6    Period Weeks    Status Met, performing transfers to and from transport chair to armchair utilizing 7 upper extremity assist able to do it independently and safely with only standby assist from physical therapist             PT [place] #1    Title Patient/caregiver will report indepence with HEP with caregiver assistance.    Baseline 6/15: not copmelting HEP regularly/ father unsure of home program/ activities  9/14: been completing regularly    Time 8    Period Weeks    Status MET   Target Date 09/15/21      PT LONG TERM GOAL #2   Title Pt will decrease 5XSTS with UE use as needed by at least  15 seconds in order to demonstrate clinically significant improvement in LE strength.    Baseline 6/15: 61.54 sec with UE A 9/14: 25.01 sec, UE assist, LE trembling   Time 12    Period Weeks    Status ONGOING    Target Date 01/12/2022      PT LONG TERM GOAL #3   Title Pt will decrease TUG to below 34 seconds/decrease in order to demonstrate decreased fall risk.    Baseline 03/17/2021= 43.49 sec using RW 9/14: 40.6 sec    Time 12    Period Weeks    Status In ONGOING    Target Date 01/12/2022      PT LONG TERM GOAL #4   Title Patient will demo ability to ambulate > 400 ft w/ LRAD versus hand held assist min assist to demonstrate improved household mobility and short community distances.    Baseline 03/17/2021= 125 feet with Min A and RW and cues for proximity to walker 9/14: 225 ft in 3:44 sec    Time 12    Period Weeks    Status ONGOING    Target Date 01/12/2022      PT LONG TERM GOAL #5   Title Pt will improve FOTO to target score to display perceived improvements in ability to complete ADL's.    Baseline 6/15: no assessed secondary to father not knowing answers to questions, ptsent home with QR code for mother to complete survey at home.    Time 12    Period Weeks    Status Defer to next visit, QR code sent with father to give to mother for filling out. 9/14: pt mother performing at  home between sessions 9/21: 47 ( 4 point increase)    Target Date 01/12/2022      PT LONG TERM GOAL #6   Title Pt will increase 10MWT by at least 0.15 m/s in order to demonstrate clinically significant improvement in community ambulation.    Baseline  03/17/2021= 0.24 m/s using RW and min A from PT for walker management  9/14: .52ms   Time 12    Period Weeks    Status ONGOING    Target Date 01/12/2022               Plan -     Clinical Impression Statement Patient presented physical therapy with father as well as with fair motivation for completion of physical therapy activities.  Patient displays improvement in ability to perform transfers as evidenced by improved efficacy with utilizing walker and to properly back up to chair with each transfer this date as well as with decreased cueing required. Pt had some fatigue at end of session and was not able to ambulate as far compared to previous sessions. Pt did perform more transfers and ambulation with transfers this date.  Patient will continue to benefit from skilled physical therapy to improve her lower extremity strength, mobility, balance, prevent falls, and improve her quality of life.     Personal Factors and Comorbidities Behavior Pattern;Transportation;Comorbidity 3+    Comorbidities HTN, CAD, HLD    Examination-Activity Limitations Bed Mobility;Dressing;Hygiene/Grooming;Caring for Others;Stairs;Stand;Toileting;Transfers;Sit;Bathing;Continence;Lift;Squat;Reach Overhead    Examination-Participation Restrictions Occupation;Medication Management;Cleaning;Yard Work;Personal Finances;Driving    Stability/Clinical Decision Making Evolving/Moderate complexity    Rehab Potential Good    PT Frequency 1x / week    PT Duration 12 weeks    PT Treatment/Interventions ADLs/Self Care Home Management;Aquatic Therapy;Electrical Stimulation;DME Instruction;Gait training;Stair training;Functional mobility training;Therapeutic activities;Therapeutic exercise;Balance training;Neuromuscular re-education;Wheelchair mobility training;Patient/family education;Orthotic Fit/Training;Manual techniques;Passive range of motion;Cryotherapy;Moist Heat    PT Next Visit Plan Functional mobility training, LE  strength and endurance    PT Home Exercise Plan Access Code: [email or web address]   Consulted and Agree with Plan of Care Patient;Family member/caregiver    Family Member [place] Lathen Seal, [place]10/19/2023, 4:10 PM

## 2021-11-24 ENCOUNTER — Ambulatory Visit: Payer: Medicaid Other | Admitting: Physical Therapy

## 2021-11-24 ENCOUNTER — Encounter: Payer: Self-pay | Admitting: Physical Therapy

## 2021-11-24 DIAGNOSIS — R2689 Other abnormalities of gait and mobility: Secondary | ICD-10-CM

## 2021-11-24 DIAGNOSIS — R262 Difficulty in walking, not elsewhere classified: Secondary | ICD-10-CM | POA: Diagnosis not present

## 2021-11-24 DIAGNOSIS — R2681 Unsteadiness on feet: Secondary | ICD-10-CM

## 2021-11-24 DIAGNOSIS — M6281 Muscle weakness (generalized): Secondary | ICD-10-CM

## 2021-11-24 DIAGNOSIS — R269 Unspecified abnormalities of gait and mobility: Secondary | ICD-10-CM

## 2021-11-28 ENCOUNTER — Encounter: Payer: Self-pay | Admitting: Speech Pathology

## 2021-11-28 NOTE — Therapy (Signed)
November 22, 2021  Patient's Name: Beverly [name] Date of Birth: 11-10-1985  Response to Recent Referral: Attention: Mena Goes, MD [facility] [address]  [place], [place] [address] [phone] F: [phone]  Dr Rexene Edison:  Our department recently received your request for a FEEs to assess [name]'s swallow function.   Currently, an instrumental swallow study and/or dysphagia therapy is not warranted. Patient has intermittently consuming regular/thin liquids. As such, she does not have any texture restrictions that need to be assessed for upgrade. [name] barrier to relying solely on oral nutrition with PEG removal is her severe cognitive impairment. These cognitive deficits are limiting her ability to take in adequate nutrition and hydration. Intervention for cognitive impacts on dysphagia have been attempted with minimal functional gain. During patient's most recent course of ST (05/2021), extensive education and conversation was had with [name] (patient's mother). At this time, pt's current cognitive impairments and functional status are now considered her new baseline.   If her mother reports increased intake orally at home, recommend consulting with dietitian to determine if current level of intake is sufficient for PEG removal. Any fluctuation in mental status or acute illness is likely to impact her ability to feed orally, however.  Should you or your office have any questions, please don't hesitate to contact me. I can be reached by email or phone.   Kind regards,   Deyna Carbon B. Rutherford Nail, M.S., CCC-SLP, Mining engineer Certified Brain Injury [place]  [place] Office [phone] Ascom [phone] Fax [phone]

## 2021-11-30 ENCOUNTER — Ambulatory Visit: Payer: Medicaid Other | Admitting: Physical Medicine & Rehabilitation

## 2021-12-01 ENCOUNTER — Ambulatory Visit: Payer: Medicaid Other

## 2021-12-01 DIAGNOSIS — R262 Difficulty in walking, not elsewhere classified: Secondary | ICD-10-CM

## 2021-12-01 DIAGNOSIS — R2689 Other abnormalities of gait and mobility: Secondary | ICD-10-CM

## 2021-12-01 DIAGNOSIS — R2681 Unsteadiness on feet: Secondary | ICD-10-CM

## 2021-12-01 DIAGNOSIS — M6281 Muscle weakness (generalized): Secondary | ICD-10-CM

## 2021-12-01 DIAGNOSIS — R269 Unspecified abnormalities of gait and mobility: Secondary | ICD-10-CM

## 2021-12-01 NOTE — Therapy (Signed)
OUTPATIENT PHYSICAL THERAPY TREATMENT NOTE      Patient Name: Beverly [name] MRN: [id number] DOB:11/26/1985, 36 y.o., female Today's Date: 12/01/2021  PCP: Ane Payment, [place] REFERRING PROVIDER: Meredith Staggers, MD   PT End of Session - 12/01/21 1551     Visit Number 13    Number of Visits 20    Date for PT Re-Evaluation 01/12/22    Authorization Type Medicaid approval- 68 visits for OT/PT/SLP combined    Authorization Time Period Auth: 9/14-12/6 12 visits    Authorization - Visit Number 6    Authorization - Number of Visits 12    Progress Note Due on Visit 20    PT Start Time 1435    PT Stop Time 1515    PT Time Calculation (min) 40 min    Equipment Utilized During Treatment Gait belt    Activity Tolerance Patient tolerated treatment well;No increased pain    Behavior During Therapy Impulsive;Restless                Past Medical History:  Diagnosis Date   Brain injury [facility])    CAD (coronary artery disease)    HLD (hyperlipidemia)    Hypertension    last pregnancy   Ischemic cardiomyopathy    STEMI (ST elevation myocardial infarction) ([place]) 09/2019   SCAD with aneurysmal dilation of proximal LAD.   Vaginal Pap smear, abnormal    when she was 36yo   Ventricular fibrillation ([place]) 09/23/2019   Past Surgical History:  Procedure Laterality Date   CARDIAC CATHETERIZATION     IR GASTROSTOMY TUBE MOD SED  10/07/2019   IR GASTROSTOMY TUBE REMOVAL  07/02/2020   IR [place] GASTRO/COLONIC TUBE PERCUT W/FLUORO  11/03/2021   LEFT HEART CATH AND CORONARY ANGIOGRAPHY N/A 09/23/2019   Procedure: LEFT HEART CATH AND CORONARY ANGIOGRAPHY;  Surgeon: Nelva Bush, MD;  Location: [place] [facility];  Service: Cardiovascular;  Laterality: N/A;   SUBQ ICD IMPLANT N/A 11/26/2020   Procedure: SUBQ ICD IMPLANT;  Surgeon: Vickie Epley, MD;  Location: [place] [facility];  Service: Cardiovascular;  Laterality: N/A;   Patient Active Problem List   Diagnosis  Date Noted   Apraxia 10/05/2021   S/P ICD (internal cardiac defibrillator) procedure 11/27/2020   Hx of Cardiac arrest ([place]) 11/26/2020   Abnormality of gait 03/16/2020   Sleep disturbance 03/16/2020   Oropharyngeal dysphagia 02/12/2020   Coronary artery disease involving native coronary artery of native heart without angina pectoris 12/12/2019   Ischemic cardiomyopathy 12/12/2019   Brain injury ([place])    Prediabetes    Anoxic brain injury ([place]) 11/05/2019   Dysphagia 11/05/2019   Physical deconditioning 11/05/2019   Acute delirium 11/05/2019   Respiratory failure ([place])    Encounter for central line placement    Ventricular fibrillation ([place]) 09/23/2019   Acute combined systolic and diastolic heart failure ([place]) 09/23/2019   Ventricular tachycardia, polymorphic ([place]) 09/23/2019   Pelvic pain affecting pregnancy 10/15/2015   Supervision of normal pregnancy in third trimester 09/28/2015   Poor weight gain of pregnancy 09/28/2015   Iron deficiency anemia of pregnancy 09/01/2015   Increased BMI (body mass index) 07/29/2015    REFERRING DIAG: G93.1 (ICD-10-CM) - Anoxic brain injury ([place])  THERAPY DIAG:  Difficulty in walking, not elsewhere classified  Unsteadiness on feet  Muscle weakness (generalized)  Abnormality of gait and mobility  Other abnormalities of gait and mobility  Rationale for Evaluation and Treatment Rehabilitation  PERTINENT HISTORY: Patient is a 36  year old with diagnois of anoxic brain injury. She was receiving outpatient PT until Feb. 2022 last year but ran out of visits. Patient has past medical history of Anoxic Brain Injury 09/2019, CAD, HLD, HTN, ISCHEMIC CARDIMYOPATHY, STEMI, V-FIB, ICD on 11/29/2020  .On 09/23/19 patient presented to ED 2 weeks postpartum (had emergent C-Section) with chest pain. In ED sustained VFib Cardiac Arrest requiring intubation. EKG showed ST elevation, Cath Lab showed aneurysmal dissection of the LAD was identified. Course  complicated by encephalopathy, anoxic brain injury, enterobacterial PNA. Patient is currently living with mom. Mother reports she is abel to walk some with a walker with supervision but primarily ambulates with HHA of mother who is her guardian. Mother also reports she requires assist for all transfers and ADL's.   PRECAUTIONS: Fall  SUBJECTIVE: Patient presents with her father this date.  Patient reports  father no significant changes from last session to his knowledge.  PAIN:  Are you having pain? No     TODAY'S TREATMENT:  12/01/21 *Heavy multimodal cues and tactile facilitation required for all mobility in session as well as intermittent modA for postural control and avoidance of LOB  -STS from table 2x5 -seated LAQ 1x10 bilat, marching 1x10 bilat -STS c RW moving to AMB 74f c RW around clinc and obstacles x6 then return to sitting with varrying success -AMB in hallway to remove visual distraction 2x730fxc RW   *educated father on using body language to determine limitations of exercise for safety   HOME EXERCISE PROGRAM: [email or web address]: [email or web address] Date: 07/28/2021 Prepared by: ChRivka BarbaraExercises - Seated Long Arc Quad  - 1 x daily - 7 x weekly - 2 sets - 10 reps -Sit to stand- 1 x daily - 7 x weekly - 2 sets - 10 reps  Marching: 1 x daily - 7 x weekly - 2 sets - 10 reps   Access Code: [email or web address] URL: [email or web address] Date: 08/04/2021 Prepared by: MaJanna ArchExercises - Key Pinch with Putty  - 1 x daily - 7 x weekly - 2 sets - 10 reps - 5 hold - Hand Squeezes  - 1 x daily - 7 x weekly - 2 sets - 10 reps - 5 hold - Tip Pinch with Putty  - 1 x daily - 7 x weekly - 2 sets - 10 reps - 5 hold   PT Short Term Goals -       PT SHORT TERM GOAL #1   Title Patient will perform all transfers with Stand by assist using least restrictive AD,  BUE Support, and reaching back for safety to improve her functional  independence and decrease fall risk.    Baseline 07/21/21: requires Min A and significant UE utilizaiton  9/14: SBA and UE use but completes safely.    Time 6    Period Weeks    Status Met, performing transfers to and from transport chair to armchair utilizing 7 upper extremity assist able to do it independently and safely with only standby assist from physical therapist             PT [email or web address]   Title Patient/caregiver will report indepence with HEP with caregiver assistance.    Baseline 6/15: not copmelting HEP regularly/ father unsure of home program/ activities  9/14: been completing regularly    Time 8    Period Weeks    Status MET  Target Date 09/15/21      PT LONG TERM GOAL #2   Title Pt will decrease 5XSTS with UE use as needed by at least  15 seconds in order to demonstrate clinically significant improvement in LE strength.    Baseline 6/15: 61.54 sec with UE A 9/14: 25.01 sec, UE assist, LE trembling   Time 12    Period Weeks    Status ONGOING    Target Date 01/12/2022      PT LONG TERM GOAL #3   Title Pt will decrease TUG to below 34 seconds/decrease in order to demonstrate decreased fall risk.    Baseline 03/17/2021= 43.49 sec using RW 9/14: 40.6 sec    Time 12    Period Weeks    Status In ONGOING    Target Date 01/12/2022      PT LONG TERM GOAL #4   Title Patient will demo ability to ambulate > 400 ft w/ LRAD versus hand held assist min assist to demonstrate improved household mobility and short community distances.    Baseline 03/17/2021= 125 feet with Min A and RW and cues for proximity to walker 9/14: 225 ft in 3:44 sec    Time 12    Period Weeks    Status ONGOING    Target Date 01/12/2022      PT LONG TERM GOAL #5   Title Pt will improve FOTO to target score to display perceived improvements in ability to complete ADL's.    Baseline 6/15: no assessed secondary to father not knowing answers to questions, ptsent home with QR  code for mother to complete survey at home.    Time 12    Period Weeks    Status Defer to next visit, QR code sent with father to give to mother for filling out. 9/14: pt mother performing at home between sessions 9/21: 47 ( 4 point increase)    Target Date 01/12/2022      PT LONG TERM GOAL #6   Title Pt will increase 10MWT by at least 0.15 m/s in order to demonstrate clinically significant improvement in community ambulation.    Baseline 03/17/2021= 0.24 m/s using RW and min A from PT for walker management  9/14: .52m/s   Time 12    Period Weeks    Status ONGOING    Target Date 01/12/2022               Plan -     Clinical Impression Statement Author facilitate safe and controlled context for high volume teaching of basic mobility tasks. Pt has heavy baseline cognitive deficits which are characterized by high distractibility, difficult attending to simple tasks, impulsive task shifting, difficulty following simple cues, difficulty expressing her response to activity. Author relies heavily on body language to maximize safety during session. Severe gross motor ataxia is also a major caution in session. No overt signs of fatigue, however pt does appear more motivated to sit after AMB ~44ft, follows cues well to continue to finish AMB distance as planned.  Patient will continue to benefit from skilled physical therapy to improve her lower extremity strength, mobility, balance, prevent falls, and improve her quality of life.   Personal Factors and Comorbidities Behavior Pattern;Transportation;Comorbidity 3+    Comorbidities HTN, CAD, HLD    Examination-Activity Limitations Bed Mobility;Dressing;Hygiene/Grooming;Caring for Others;Stairs;Stand;Toileting;Transfers;Sit;Bathing;Continence;Lift;Squat;Reach Overhead    Examination-Participation Restrictions Occupation;Medication Management;Cleaning;Yard Work;Personal Finances;Driving    Stability/Clinical Decision Making Evolving/Moderate complexity     Rehab Potential Good    PT Frequency  1x / week    PT Duration 12 weeks    PT Treatment/Interventions ADLs/Self Care Home Management;Aquatic Therapy;Electrical Stimulation;DME Instruction;Gait training;Stair training;Functional mobility training;Therapeutic activities;Therapeutic exercise;Balance training;Neuromuscular re-education;Wheelchair mobility training;Patient/family education;Orthotic Fit/Training;Manual techniques;Passive range of motion;Cryotherapy;Moist Heat    PT Next Visit Plan Functional mobility training, cognitive remediation tasks   PT Home Exercise Plan Access Code: [email or web address]    Consulted and Agree with Plan of Care Patient;Family member/caregiver    Family Member Consulted Dad           3:53 PM, 12/01/21 Etta Grandchild, PT, DPT Physical Therapist - [facility]  Outpatient Physical Therapy- [place] [phone]      [place] C, PT 12/01/2021, 3:53 PM

## 2021-12-08 ENCOUNTER — Ambulatory Visit: Payer: Medicaid Other | Attending: Physical Medicine & Rehabilitation | Admitting: Physical Therapy

## 2021-12-08 DIAGNOSIS — R262 Difficulty in walking, not elsewhere classified: Secondary | ICD-10-CM | POA: Insufficient documentation

## 2021-12-08 DIAGNOSIS — M6281 Muscle weakness (generalized): Secondary | ICD-10-CM | POA: Insufficient documentation

## 2021-12-08 DIAGNOSIS — R269 Unspecified abnormalities of gait and mobility: Secondary | ICD-10-CM | POA: Diagnosis present

## 2021-12-08 DIAGNOSIS — R2689 Other abnormalities of gait and mobility: Secondary | ICD-10-CM | POA: Diagnosis present

## 2021-12-08 DIAGNOSIS — R2681 Unsteadiness on feet: Secondary | ICD-10-CM | POA: Diagnosis present

## 2021-12-08 NOTE — Therapy (Signed)
OUTPATIENT PHYSICAL THERAPY TREATMENT NOTE      Patient Name: Beverly [name] MRN: [id number] DOB:[birth year], 36 y.o., female Today's Date: 12/09/2021  PCP: Ane Payment, [place] REFERRING PROVIDER: Meredith Staggers, MD   PT End of Session - 12/08/21 1435     Visit Number 14    Number of Visits 20    Date for PT Re-Evaluation 01/12/22    Authorization Type Medicaid approval- 6 visits for OT/PT/SLP combined    Authorization Time Period Auth: 9/14-12/6 12 visits    Authorization - Visit Number 7    Authorization - Number of Visits 12    Progress Note Due on Visit 20    PT Start Time 6222    PT Stop Time 1513    PT Time Calculation (min) 37 min    Equipment Utilized During Treatment Gait belt    Activity Tolerance Patient tolerated treatment well;No increased pain    Behavior During Therapy Impulsive;Restless                 Past Medical History:  Diagnosis Date   Brain injury [facility])    CAD (coronary artery disease)    HLD (hyperlipidemia)    Hypertension    last pregnancy   Ischemic cardiomyopathy    STEMI (ST elevation myocardial infarction) ([place]) 09/2019   SCAD with aneurysmal dilation of proximal LAD.   Vaginal Pap smear, abnormal    when she was 36yo   Ventricular fibrillation ([place]) 09/23/2019   Past Surgical History:  Procedure Laterality Date   CARDIAC CATHETERIZATION     IR GASTROSTOMY TUBE MOD SED  10/07/2019   IR GASTROSTOMY TUBE REMOVAL  07/02/2020   IR [place] GASTRO/COLONIC TUBE PERCUT W/FLUORO  11/03/2021   LEFT HEART CATH AND CORONARY ANGIOGRAPHY N/A 09/23/2019   Procedure: LEFT HEART CATH AND CORONARY ANGIOGRAPHY;  Surgeon: Nelva Bush, MD;  Location: [place] [facility];  Service: Cardiovascular;  Laterality: N/A;   SUBQ ICD IMPLANT N/A 11/26/2020   Procedure: SUBQ ICD IMPLANT;  Surgeon: Vickie Epley, MD;  Location: [place] [facility];  Service: Cardiovascular;  Laterality: N/A;   Patient Active Problem List    Diagnosis Date Noted   Apraxia 10/05/2021   S/P ICD (internal cardiac defibrillator) procedure 11/27/2020   Hx of Cardiac arrest ([place]) 11/26/2020   Abnormality of gait 03/16/2020   Sleep disturbance 03/16/2020   Oropharyngeal dysphagia 02/12/2020   Coronary artery disease involving native coronary artery of native heart without angina pectoris 12/12/2019   Ischemic cardiomyopathy 12/12/2019   Brain injury ([place])    Prediabetes    Anoxic brain injury ([place]) 11/05/2019   Dysphagia 11/05/2019   Physical deconditioning 11/05/2019   Acute delirium 11/05/2019   Respiratory failure ([place])    Encounter for central line placement    Ventricular fibrillation ([place]) 09/23/2019   Acute combined systolic and diastolic heart failure ([place]) 09/23/2019   Ventricular tachycardia, polymorphic ([place]) 09/23/2019   Pelvic pain affecting pregnancy 10/15/2015   Supervision of normal pregnancy in third trimester 09/28/2015   Poor weight gain of pregnancy 09/28/2015   Iron deficiency anemia of pregnancy 09/01/2015   Increased BMI (body mass index) 07/29/2015    REFERRING DIAG: G93.1 (ICD-10-CM) - Anoxic brain injury ([place])  THERAPY DIAG:  Difficulty in walking, not elsewhere classified  Unsteadiness on feet  Abnormality of gait and mobility  Muscle weakness (generalized)  Rationale for Evaluation and Treatment Rehabilitation  PERTINENT HISTORY: Patient is a 36 year old with diagnois of anoxic  brain injury. She was receiving outpatient PT until Feb. 2022 last year but ran out of visits. Patient has past medical history of Anoxic Brain Injury 09/2019, CAD, HLD, HTN, ISCHEMIC CARDIMYOPATHY, STEMI, V-FIB, ICD on 11/29/2020  .On 09/23/19 patient presented to ED 2 weeks postpartum (had emergent C-Section) with chest pain. In ED sustained VFib Cardiac Arrest requiring intubation. EKG showed ST elevation, Cath Lab showed aneurysmal dissection of the LAD was identified. Course complicated by encephalopathy, anoxic  brain injury, enterobacterial PNA. Patient is currently living with mom. Mother reports she is abel to walk some with a walker with supervision but primarily ambulates with HHA of mother who is her guardian. Mother also reports she requires assist for all transfers and ADL's.   PRECAUTIONS: Fall  SUBJECTIVE: Patient presents with her father this date.  Patient reports  father no significant changes from last session to his knowledge.  PAIN:  Are you having pain? No     TODAY'S TREATMENT:  12/09/21 *Heavy multimodal cues and tactile facilitation required for all mobility in session as well as intermittent modA for postural control and avoidance of LOB    TA Transfer training:  STS, 8 foot walk, then STS, repeat walk and sit in starting chair  -seated rest at starting chair with seated therex as below  - RW, fatigue and increased difficulty on second rep compared to first  -Difficulty with movement sequencing continued but improved safety and instruction following this date.  -Pt also has difficulty with LE placement with this activity - x 6 total STS transitions and ambulation   Ambulation x 100 feet in clinic hallway. Pt with noted fatigue at end of ambulatory bout as evidenced by attempting to sit in floor.  Patient requires multimodal cueing for standing" extremity to walker patient tends to stand on right side of walker and push walker too far anteriorly resulting in a forward flexed trunk. TE Using red  hedge hog per pt favorite color (changed from red being favorite previously)  -march 10x; 2 sets completed sets  of 3 STS and ambulation   -LAQ   2x 10 ea LE  each LE  wth goal of kicking hedgehog, cues     -Hip abduction to pressure of PT hands on hedgehog " pushing into red) x 10 ea LE     -Hip adduction seated into ball x 10 with intermittent holds.     HOME EXERCISE PROGRAM: Access Code: [facility] URL: [email or web address] Date: 07/28/2021 Prepared by:  Rivka Barbara  Exercises - Seated Long Arc Quad  - 1 x daily - 7 x weekly - 2 sets - 10 reps -Sit to stand- 1 x daily - 7 x weekly - 2 sets - 10 reps  Marching: 1 x daily - 7 x weekly - 2 sets - 10 reps   Access Code: [email or web address] URL: [email or web address] Date: 08/04/2021 Prepared by: Janna Arch  Exercises - Key Pinch with Putty  - 1 x daily - 7 x weekly - 2 sets - 10 reps - 5 hold - Hand Squeezes  - 1 x daily - 7 x weekly - 2 sets - 10 reps - 5 hold - Tip Pinch with Putty  - 1 x daily - 7 x weekly - 2 sets - 10 reps - 5 hold   PT Short Term Goals -       PT SHORT TERM GOAL #1   Title Patient will perform all transfers with Stand by assist using least restrictive AD,  BUE  Support, and reaching back for safety to improve her functional independence and decrease fall risk.    Baseline 07/21/21: requires Min A and significant UE utilizaiton  9/14: SBA and UE use but completes safely.    Time 6    Period Weeks    Status Met, performing transfers to and from transport chair to armchair utilizing 7 upper extremity assist able to do it independently and safely with only standby assist from physical therapist             PT [place] #1   Title Patient/caregiver will report indepence with HEP with caregiver assistance.    Baseline 6/15: not copmelting HEP regularly/ father unsure of home program/ activities  9/14: been completing regularly    Time 8    Period Weeks    Status MET   Target Date 09/15/21      PT LONG TERM GOAL #2   Title Pt will decrease 5XSTS with UE use as needed by at least  15 seconds in order to demonstrate clinically significant improvement in LE strength.    Baseline 6/15: 61.54 sec with UE A 9/14: 25.01 sec, UE assist, LE trembling   Time 12    Period Weeks    Status ONGOING    Target Date 01/12/2022      PT LONG TERM GOAL #3   Title Pt will decrease TUG to below 34 seconds/decrease in order to demonstrate  decreased fall risk.    Baseline 03/17/2021= 43.49 sec using RW 9/14: 40.6 sec    Time 12    Period Weeks    Status In ONGOING    Target Date 01/12/2022      PT LONG TERM GOAL #4   Title Patient will demo ability to ambulate > 400 ft w/ LRAD versus hand held assist min assist to demonstrate improved household mobility and short community distances.    Baseline 03/17/2021= 125 feet with Min A and RW and cues for proximity to walker 9/14: 225 ft in 3:44 sec    Time 12    Period Weeks    Status ONGOING    Target Date 01/12/2022      PT LONG TERM GOAL #5   Title Pt will improve FOTO to target score to display perceived improvements in ability to complete ADL's.    Baseline 6/15: no assessed secondary to father not knowing answers to questions, ptsent home with QR code for mother to complete survey at home.    Time 12    Period Weeks    Status Defer to next visit, QR code sent with father to give to mother for filling out. 9/14: pt mother performing at home between sessions 9/21: 47 ( 4 point increase)    Target Date 01/12/2022      PT LONG TERM GOAL #6   Title Pt will increase 10MWT by at least 0.15 m/s in order to demonstrate clinically significant improvement in community ambulation.    Baseline 03/17/2021= 0.24 m/s using RW and min A from PT for walker management  9/14: .63ms   Time 12    Period Weeks    Status ONGOING    Target Date 01/12/2022               Plan -     Clinical Impression Statement Patient presents to physical therapy with her father this date.  Patient continuing to show impaired ability to perform  sit to stand transfers safely and effectively.  Patient continues to require multimodal cueing for proper performance and does demonstrate decreased ability to perform physical therapy activities this date compared to previous physical therapy visit with this therapist.Pt will continue to benefit from skilled physical therapy intervention to address impairments, improve  QOL, and attain therapy goals.     Personal Factors and Comorbidities Behavior Pattern;Transportation;Comorbidity 3+    Comorbidities HTN, CAD, HLD    Examination-Activity Limitations Bed Mobility;Dressing;Hygiene/Grooming;Caring for Others;Stairs;Stand;Toileting;Transfers;Sit;Bathing;Continence;Lift;Squat;Reach Overhead    Examination-Participation Restrictions Occupation;Medication Management;Cleaning;Yard Work;Personal Finances;Driving    Stability/Clinical Decision Making Evolving/Moderate complexity    Rehab Potential Good    PT Frequency 1x / week    PT Duration 12 weeks    PT Treatment/Interventions ADLs/Self Care Home Management;Aquatic Therapy;Electrical Stimulation;DME Instruction;Gait training;Stair training;Functional mobility training;Therapeutic activities;Therapeutic exercise;Balance training;Neuromuscular re-education;Wheelchair mobility training;Patient/family education;Orthotic Fit/Training;Manual techniques;Passive range of motion;Cryotherapy;Moist Heat    PT Next Visit Plan Functional mobility training, cognitive remediation tasks   PT Home Exercise Plan Access Code: [email or web address]    Consulted and Agree with Plan of Care Patient;Family member/caregiver    Family Member Consulted Dad           9:20 AM, 12/09/21    Particia Lather, PT 12/09/2021, 9:20 AM

## 2021-12-09 ENCOUNTER — Encounter: Payer: Self-pay | Admitting: Physical Therapy

## 2021-12-15 ENCOUNTER — Ambulatory Visit: Payer: Medicaid Other | Admitting: Physical Therapy

## 2021-12-15 ENCOUNTER — Encounter: Payer: Self-pay | Admitting: Physical Therapy

## 2021-12-15 ENCOUNTER — Ambulatory Visit (INDEPENDENT_AMBULATORY_CARE_PROVIDER_SITE_OTHER): Payer: Medicaid Other

## 2021-12-15 DIAGNOSIS — R262 Difficulty in walking, not elsewhere classified: Secondary | ICD-10-CM

## 2021-12-15 DIAGNOSIS — R2689 Other abnormalities of gait and mobility: Secondary | ICD-10-CM

## 2021-12-15 DIAGNOSIS — I255 Ischemic cardiomyopathy: Secondary | ICD-10-CM

## 2021-12-15 DIAGNOSIS — R2681 Unsteadiness on feet: Secondary | ICD-10-CM

## 2021-12-15 DIAGNOSIS — M6281 Muscle weakness (generalized): Secondary | ICD-10-CM

## 2021-12-15 DIAGNOSIS — R269 Unspecified abnormalities of gait and mobility: Secondary | ICD-10-CM

## 2021-12-15 LAB — CUP PACEART REMOTE DEVICE CHECK
Battery Remaining Percentage: 84 %
Date Time Interrogation Session: 20231109073400
Implantable Pulse Generator Implant Date: 20221021
Pulse Gen Serial Number: 161805

## 2021-12-15 NOTE — Therapy (Signed)
OUTPATIENT PHYSICAL THERAPY TREATMENT NOTE      Patient Name: Beverly [name] MRN: [id number] DOB:1986-01-18, 36 y.o., female Today's Date: 12/15/2021  PCP: Ane Payment, [place] REFERRING PROVIDER: Meredith Staggers, MD   PT End of Session - 12/15/21 1024     Visit Number 15    Number of Visits 20    Date for PT Re-Evaluation 01/12/22    Authorization Type Medicaid approval- 11 visits for OT/PT/SLP combined    Authorization Time Period Auth: 9/14-12/6 12 visits    Authorization - Visit Number 8    Authorization - Number of Visits 12    Progress Note Due on Visit 20    PT Start Time 3329    PT Stop Time 1059    PT Time Calculation (min) 36 min    Equipment Utilized During Treatment Gait belt    Activity Tolerance Patient tolerated treatment well;No increased pain    Behavior During Therapy Impulsive;Restless                  Past Medical History:  Diagnosis Date   Brain injury [facility])    CAD (coronary artery disease)    HLD (hyperlipidemia)    Hypertension    last pregnancy   Ischemic cardiomyopathy    STEMI (ST elevation myocardial infarction) ([place]) 09/2019   SCAD with aneurysmal dilation of proximal LAD.   Vaginal Pap smear, abnormal    when she was 36yo   Ventricular fibrillation ([place]) 09/23/2019   Past Surgical History:  Procedure Laterality Date   CARDIAC CATHETERIZATION     IR GASTROSTOMY TUBE MOD SED  10/07/2019   IR GASTROSTOMY TUBE REMOVAL  07/02/2020   IR [place] GASTRO/COLONIC TUBE PERCUT W/FLUORO  11/03/2021   LEFT HEART CATH AND CORONARY ANGIOGRAPHY N/A 09/23/2019   Procedure: LEFT HEART CATH AND CORONARY ANGIOGRAPHY;  Surgeon: Nelva Bush, MD;  Location: [place] [facility];  Service: Cardiovascular;  Laterality: N/A;   SUBQ ICD IMPLANT N/A 11/26/2020   Procedure: SUBQ ICD IMPLANT;  Surgeon: Vickie Epley, MD;  Location: [place] [facility];  Service: Cardiovascular;  Laterality: N/A;   Patient Active Problem List    Diagnosis Date Noted   Apraxia 10/05/2021   S/P ICD (internal cardiac defibrillator) procedure 11/27/2020   Hx of Cardiac arrest ([place]) 11/26/2020   Abnormality of gait 03/16/2020   Sleep disturbance 03/16/2020   Oropharyngeal dysphagia 02/12/2020   Coronary artery disease involving native coronary artery of native heart without angina pectoris 12/12/2019   Ischemic cardiomyopathy 12/12/2019   Brain injury ([place])    Prediabetes    Anoxic brain injury ([place]) 11/05/2019   Dysphagia 11/05/2019   Physical deconditioning 11/05/2019   Acute delirium 11/05/2019   Respiratory failure ([place])    Encounter for central line placement    Ventricular fibrillation ([place]) 09/23/2019   Acute combined systolic and diastolic heart failure ([place]) 09/23/2019   Ventricular tachycardia, polymorphic ([place]) 09/23/2019   Pelvic pain affecting pregnancy 10/15/2015   Supervision of normal pregnancy in third trimester 09/28/2015   Poor weight gain of pregnancy 09/28/2015   Iron deficiency anemia of pregnancy 09/01/2015   Increased BMI (body mass index) 07/29/2015    REFERRING DIAG: G93.1 (ICD-10-CM) - Anoxic brain injury ([place])  THERAPY DIAG:  Difficulty in walking, not elsewhere classified  Unsteadiness on feet  Abnormality of gait and mobility  Muscle weakness (generalized)  Other abnormalities of gait and mobility  Rationale for Evaluation and Treatment Rehabilitation  PERTINENT HISTORY: Patient is  a 36 year old with diagnois of anoxic brain injury. She was receiving outpatient PT until Feb. 2022 last year but ran out of visits. Patient has past medical history of Anoxic Brain Injury 09/2019, CAD, HLD, HTN, ISCHEMIC CARDIMYOPATHY, STEMI, V-FIB, ICD on 11/29/2020  .On 09/23/19 patient presented to ED 2 weeks postpartum (had emergent C-Section) with chest pain. In ED sustained VFib Cardiac Arrest requiring intubation. EKG showed ST elevation, Cath Lab showed aneurysmal dissection of the LAD was identified.  Course complicated by encephalopathy, anoxic brain injury, enterobacterial PNA. Patient is currently living with mom. Mother reports she is abel to walk some with a walker with supervision but primarily ambulates with HHA of mother who is her guardian. Mother also reports she requires assist for all transfers and ADL's.   PRECAUTIONS: Fall  SUBJECTIVE: Patient presents with her father this date.  Patient father no significant changes from last session to his knowledge. Pt presents with improved motivation this date and was abel to respond to several questions with one word answers.   PAIN:  Are you having pain? No     TODAY'S TREATMENT:  12/15/21 *Heavy multimodal cues and tactile facilitation required for all mobility in session as well as intermittent modA for postural control and avoidance of LOB    TA Transfer training:  STS, 12 foot walk through doorway with turn, then STS, repeat walk and sit in starting chair  -seated rest at starting chair with seated therex as below  - RW, fatigue and increased difficulty on second rep compared to first  -Difficulty with movement sequencing continued but improved safety and instruction following this date.  -Pt also has difficulty with LE placement with this activity - x 6 total STS transitions and ambulation   STS x 5 reps with 5# AW on ea LE to assist with maintaining appropriate foot positioning. Good response to this but still has difficulty following task to stand up.   Ambulation x 150 feet in clinic hallway. Pt with noted fatigue at end of ambulatory bout as evidenced by attempting to sit in floor.  Patient requires multimodal cueing for standing" extremity to walker patient tends to stand on right side of walker and push walker too far anteriorly resulting in a forward flexed trunk.  TE Using red  hedge hog per pt favorite color  -march 10x; 2 sets completed    -LAQ   2x 10 ea LE  each LE  with goal of kicking hedgehog, cues  required frequently, also use red hedgehog to kick for coordination of LE training. Improved efficacy with this task this date.          HOME EXERCISE PROGRAM: Access Code: [facility] URL: [email or web address] Date: 07/28/2021 Prepared by: Rivka Barbara  Exercises - Seated Long Arc Quad  - 1 x daily - 7 x weekly - 2 sets - 10 reps -Sit to stand- 1 x daily - 7 x weekly - 2 sets - 10 reps  Marching: 1 x daily - 7 x weekly - 2 sets - 10 reps   Access Code: [email or web address] URL: [email or web address] Date: 08/04/2021 Prepared by: Janna Arch  Exercises - Key Pinch with Putty  - 1 x daily - 7 x weekly - 2 sets - 10 reps - 5 hold - Hand Squeezes  - 1 x daily - 7 x weekly - 2 sets - 10 reps - 5 hold - Tip Pinch with Putty  - 1 x daily - 7 x weekly - 2 sets -  10 reps - 5 hold   PT Short Term Goals -       PT SHORT TERM GOAL #1   Title Patient will perform all transfers with Stand by assist using least restrictive AD,  BUE Support, and reaching back for safety to improve her functional independence and decrease fall risk.    Baseline 07/21/21: requires Min A and significant UE utilizaiton  9/14: SBA and UE use but completes safely.    Time 6    Period Weeks    Status Met, performing transfers to and from transport chair to armchair utilizing 7 upper extremity assist able to do it independently and safely with only standby assist from physical therapist             PT [place] #1   Title Patient/caregiver will report indepence with HEP with caregiver assistance.    Baseline 6/15: not copmelting HEP regularly/ father unsure of home program/ activities  9/14: been completing regularly    Time 8    Period Weeks    Status MET   Target Date 09/15/21      PT LONG TERM GOAL #2   Title Pt will decrease 5XSTS with UE use as needed by at least  15 seconds in order to demonstrate clinically significant improvement in LE strength.     Baseline 6/15: 61.54 sec with UE A 9/14: 25.01 sec, UE assist, LE trembling   Time 12    Period Weeks    Status ONGOING    Target Date 01/12/2022      PT LONG TERM GOAL #3   Title Pt will decrease TUG to below 34 seconds/decrease in order to demonstrate decreased fall risk.    Baseline 03/17/2021= 43.49 sec using RW 9/14: 40.6 sec    Time 12    Period Weeks    Status In ONGOING    Target Date 01/12/2022      PT LONG TERM GOAL #4   Title Patient will demo ability to ambulate > 400 ft w/ LRAD versus hand held assist min assist to demonstrate improved household mobility and short community distances.    Baseline 03/17/2021= 125 feet with Min A and RW and cues for proximity to walker 9/14: 225 ft in 3:44 sec    Time 12    Period Weeks    Status ONGOING    Target Date 01/12/2022      PT LONG TERM GOAL #5   Title Pt will improve FOTO to target score to display perceived improvements in ability to complete ADL's.    Baseline 6/15: no assessed secondary to father not knowing answers to questions, ptsent home with QR code for mother to complete survey at home.    Time 12    Period Weeks    Status Defer to next visit, QR code sent with father to give to mother for filling out. 9/14: pt mother performing at home between sessions 9/21: 47 ( 4 point increase)    Target Date 01/12/2022      PT LONG TERM GOAL #6   Title Pt will increase 10MWT by at least 0.15 m/s in order to demonstrate clinically significant improvement in community ambulation.    Baseline 03/17/2021= 0.24 m/s using RW and min A from PT for walker management  9/14: .73ms   Time 12    Period Weeks    Status ONGOING    Target Date 01/12/2022  Plan -     Clinical Impression Statement Patient presents to physical therapy with her father this date. Pt demonstrated improved ability to follow instruction this date compared to previous session.  Patient continues to require multimodal cueing for proper performance.  Trialed with 5# AW on Les to improve STS efficacy with good results and improved consistency of foot location for STS transitions. Pt also ambulates further this date with ambulation tasks without need to sit secondary to fatigue. Pt will continue to benefit from skilled physical therapy intervention to address impairments, improve QOL, and attain therapy goals.     Personal Factors and Comorbidities Behavior Pattern;Transportation;Comorbidity 3+    Comorbidities HTN, CAD, HLD    Examination-Activity Limitations Bed Mobility;Dressing;Hygiene/Grooming;Caring for Others;Stairs;Stand;Toileting;Transfers;Sit;Bathing;Continence;Lift;Squat;Reach Overhead    Examination-Participation Restrictions Occupation;Medication Management;Cleaning;Yard Work;Personal Finances;Driving    Stability/Clinical Decision Making Evolving/Moderate complexity    Rehab Potential Good    PT Frequency 1x / week    PT Duration 12 weeks    PT Treatment/Interventions ADLs/Self Care Home Management;Aquatic Therapy;Electrical Stimulation;DME Instruction;Gait training;Stair training;Functional mobility training;Therapeutic activities;Therapeutic exercise;Balance training;Neuromuscular re-education;Wheelchair mobility training;Patient/family education;Orthotic Fit/Training;Manual techniques;Passive range of motion;Cryotherapy;Moist Heat    PT Next Visit Plan Functional mobility training, cognitive remediation tasks   PT Home Exercise Plan Access Code: [email or web address]    Consulted and Agree with Plan of Care Patient;Family member/caregiver    Family Member Consulted Dad           12:44 PM, 12/15/21    Particia Lather, PT 12/15/2021, 12:44 PM

## 2021-12-21 ENCOUNTER — Encounter: Payer: Medicaid Other | Attending: Physical Medicine & Rehabilitation | Admitting: Physical Medicine & Rehabilitation

## 2021-12-21 ENCOUNTER — Encounter: Payer: Self-pay | Admitting: Physical Medicine & Rehabilitation

## 2021-12-21 VITALS — BP 94/63 | HR 91

## 2021-12-21 DIAGNOSIS — G931 Anoxic brain damage, not elsewhere classified: Secondary | ICD-10-CM | POA: Insufficient documentation

## 2021-12-21 DIAGNOSIS — R482 Apraxia: Secondary | ICD-10-CM | POA: Diagnosis not present

## 2021-12-21 DIAGNOSIS — R1312 Dysphagia, oropharyngeal phase: Secondary | ICD-10-CM | POA: Diagnosis not present

## 2021-12-21 MED ORDER — CARBIDOPA-LEVODOPA 25-100 MG PO TABS: 1.0000 | ORAL_TABLET | Freq: Three times a day (TID) | ORAL | 3 refills | Status: DC

## 2021-12-21 MED ORDER — METHYLPHENIDATE HCL 10 MG PO TABS: 15.0000 mg | ORAL_TABLET | Freq: Two times a day (BID) | ORAL | 0 refills | Status: DC

## 2021-12-21 NOTE — Patient Instructions (Addendum)
ALWAYS FEEL FREE TO CALL OUR OFFICE WITH ANY PROBLEMS OR QUESTIONS [phone])  **PLEASE NOTE** ALL MEDICATION REFILL REQUESTS (INCLUDING CONTROLLED SUBSTANCES) NEED TO BE MADE AT LEAST 7 DAYS PRIOR TO REFILL BEING DUE. ANY REFILL REQUESTS INSIDE THAT TIME FRAME MAY RESULT IN DELAYS IN RECEIVING YOUR PRESCRIPTION.    SINEMET: TAKE 25/100 PILLS TWICE DAILY FOR ONE WEEK THEN INCREASE TO THREE X DAILY

## 2021-12-21 NOTE — Progress Notes (Signed)
Subjective:    Patient ID: Beverly [name], female    DOB: 1985/02/08, 36 y.o.   MRN: [id number]  HPI  [name] is here in follow up of her ABI. She had good results initially with the sinemet. She was initiating more and more engaged and interactive with her family. The effects seemed to have waned over the last week or 2. She's had another misfire of her ICD but it was not due to any CV issues. The ICD may need to be checked. Bp's and hr have been under control.   [name] is still in PT as well as some speech groups. She does well with the others in her group and often initiates a bit more at home when with mom. She really hasn't made any progress with her swallowing and is primarily receiving TF. She tends to still pocket saliva and blow up her cheeks. Mom can usually get her to spit out the saliva into a cup or tissue.   Pain Inventory Average Pain 0 Pain Right Now 0 My pain is  no pain  LOCATION OF PAIN na  BOWEL Number of stools per week: 7 Oral laxative use No  Type of laxative . Enema or suppository use No  History of colostomy No  Incontinent Yes   BLADDER Pads In and out cath, frequency . Able to self cath  . Bladder incontinence No  Frequent urination No  Leakage with coughing No  Difficulty starting stream No  Incomplete bladder emptying No    Mobility walk with assistance use a walker how many minutes can you walk? 3-5 ability to climb steps?  yes do you drive?  no use a wheelchair  Function disabled: date disabled . I need assistance with the following:  feeding, dressing, bathing, toileting, meal prep, household duties, and shopping  Neuro/Psych tremor trouble walking spasms confusion  Prior Studies Any changes since last visit?  no  Physicians involved in your care Any changes since last visit?  no   Family History  Problem Relation Age of Onset   Hyperlipidemia Mother    Hypertension Mother    Diabetes Maternal Grandmother     Seizures Maternal Grandmother    Thyroid disease Maternal Grandmother    Diabetes Paternal Grandmother    Rashes / Skin problems Son        ezcema   Seizures Son    Social History   Socioeconomic History   Marital status: Single    Spouse name: Not on file   Number of children: 3   Years of education: Not on file   Highest education level: Not on file  Occupational History   Occupation: oncology    Employer: [employer]    Comment: NA  Tobacco Use   Smoking status: Never   Smokeless tobacco: Never  Vaping Use   Vaping Use: Never used  Substance and Sexual Activity   Alcohol use: Not Currently    Alcohol/week: 0.0 standard drinks of alcohol    Comment: quit 2017   Drug use: No    Frequency: 5.0 times per week    Types: Marijuana    Comment: stopped when she found out she was pregnant   Sexual activity: Not Currently    Partners: Male    Birth control/protection: None  Other Topics Concern   Not on file  Social History Narrative   01/06/20 lives with mom and her children   Social Determinants of Health   Financial Resource Strain: Not on  file  Food Insecurity: Not on file  Transportation Needs: Not on file  Physical Activity: Not on file  Stress: Not on file  Social Connections: Not on file   Past Surgical History:  Procedure Laterality Date   CARDIAC CATHETERIZATION     IR GASTROSTOMY TUBE MOD SED  10/07/2019   IR GASTROSTOMY TUBE REMOVAL  07/02/2020   IR [place] GASTRO/COLONIC TUBE PERCUT W/FLUORO  11/03/2021   LEFT HEART CATH AND CORONARY ANGIOGRAPHY N/A 09/23/2019   Procedure: LEFT HEART CATH AND CORONARY ANGIOGRAPHY;  Surgeon: Yvonne Kendall, MD;  Location: [facility] INVASIVE CV LAB;  Service: Cardiovascular;  Laterality: N/A;   SUBQ ICD IMPLANT N/A 11/26/2020   Procedure: SUBQ ICD IMPLANT;  Surgeon: Lanier Prude, MD;  Location: [facility] INVASIVE [facility];  Service: Cardiovascular;  Laterality: N/A;   Past Medical History:  Diagnosis Date   Brain injury ([facility])     CAD (coronary artery disease)    HLD (hyperlipidemia)    Hypertension    last pregnancy   Ischemic cardiomyopathy    STEMI (ST elevation myocardial infarction) ([facility]) 09/2019   SCAD with aneurysmal dilation of proximal LAD.   Vaginal Pap smear, abnormal    when she was 36yo   Ventricular fibrillation ([facility]) 09/23/2019   BP 94/63   Pulse 91   Opioid Risk Score:   Fall Risk Score:  `1  Depression screen [facility] 2/9     10/05/2021    3:35 PM 08/03/2021    3:15 PM 06/01/2021    3:14 PM 03/23/2021    3:03 PM 02/02/2021    2:22 PM 09/08/2020    3:45 PM 07/14/2020    3:27 PM  Depression screen PHQ 2/9  Decreased Interest 0 0 0 0 0 0 0  Down, Depressed, Hopeless 0 0 0 0 0 0 0  PHQ - 2 Score 0 0 0 0 0 0 0  Altered sleeping       0  PHQ-9 Score       0     Review of Systems     Objective:   Physical Exam  General: No acute distress HEENT: NCAT, EOMI, oral membranes moist Cards: reg rate  Chest: normal effort Abdomen: Soft, NT, ND Skin: dry, intact Extremities: no edema Psych: pleasant, smiling  Skin: Warm and dry.  Intact.    Musc: No edema in extremities.  No tenderness in extremities. Neuro: Still holding cheeks full, . More engaging. Answers questions. Still very dysarthric. Seems close to answering questions but gets "stuck."  Puffs her cheeks. Moves all 4's. Apraxic.       Assessment & Plan:  1. Anoxic BI - substantial attention, initiation deficits as well as memory deficits              MRI/CTs unremarkable             -Recent ICD placement.           -Ritalin  -continue at 15 mg bid. -Heart rate and blood pressure have been under good control -Titrate sinemet to 25/100 bid for one week then tid.    2. Dysphagia:             -ongoing oral motor apraxia and coordination. Seems to be closer to initiating.              Cont PEG as a back up for now             -[facility] SLP for cognition and swallowing finishing  up but will resume in January.                 3. Gait  abnormality             Cont ambulation with assistance               RW for balance              3. Sleep disturbance: improved             Melatonin 3 mg daily --can take with trazodone            -trazodone at night time scheduled.  this has helped her sleep/wake quite a bit              -More alert during the day per mom   15 minutes of face to face patient care time were spent during this visit. All questions were encouraged and answered.  Follow up with me in 3 mos .

## 2021-12-22 ENCOUNTER — Ambulatory Visit: Payer: Medicaid Other | Admitting: Physical Therapy

## 2021-12-22 DIAGNOSIS — R262 Difficulty in walking, not elsewhere classified: Secondary | ICD-10-CM | POA: Diagnosis not present

## 2021-12-22 DIAGNOSIS — R2681 Unsteadiness on feet: Secondary | ICD-10-CM

## 2021-12-22 DIAGNOSIS — R269 Unspecified abnormalities of gait and mobility: Secondary | ICD-10-CM

## 2021-12-22 DIAGNOSIS — M6281 Muscle weakness (generalized): Secondary | ICD-10-CM

## 2021-12-22 NOTE — Therapy (Signed)
OUTPATIENT PHYSICAL THERAPY TREATMENT NOTE      Patient Name: Beverly [name] MRN: [id number] DOB:December 29, 1985, 36 y.o., female Today's Date: 12/22/2021  PCP: Ane Payment, [place] REFERRING PROVIDER: Meredith Staggers, MD   PT End of Session - 12/22/21 1507     Visit Number 16    Number of Visits 20    Date for PT Re-Evaluation 01/12/22    Authorization Type Medicaid approval- 20 visits for OT/PT/SLP combined    Authorization Time Period Auth: 9/14-12/6 12 visits    Authorization - Visit Number 9    Authorization - Number of Visits 12    Progress Note Due on Visit 20    Equipment Utilized During Treatment Gait belt    Activity Tolerance Patient tolerated treatment well;No increased pain    Behavior During Therapy Impulsive;Restless                   Past Medical History:  Diagnosis Date   Brain injury [facility])    CAD (coronary artery disease)    HLD (hyperlipidemia)    Hypertension    last pregnancy   Ischemic cardiomyopathy    STEMI (ST elevation myocardial infarction) ([place]) 09/2019   SCAD with aneurysmal dilation of proximal LAD.   Vaginal Pap smear, abnormal    when she was 36yo   Ventricular fibrillation ([place]) 09/23/2019   Past Surgical History:  Procedure Laterality Date   CARDIAC CATHETERIZATION     IR GASTROSTOMY TUBE MOD SED  10/07/2019   IR GASTROSTOMY TUBE REMOVAL  07/02/2020   IR [place] GASTRO/COLONIC TUBE PERCUT W/FLUORO  11/03/2021   LEFT HEART CATH AND CORONARY ANGIOGRAPHY N/A 09/23/2019   Procedure: LEFT HEART CATH AND CORONARY ANGIOGRAPHY;  Surgeon: Nelva Bush, MD;  Location: [place] [facility];  Service: Cardiovascular;  Laterality: N/A;   SUBQ ICD IMPLANT N/A 11/26/2020   Procedure: SUBQ ICD IMPLANT;  Surgeon: Vickie Epley, MD;  Location: [place] [facility];  Service: Cardiovascular;  Laterality: N/A;   Patient Active Problem List   Diagnosis Date Noted   Apraxia 10/05/2021   S/P ICD (internal cardiac  defibrillator) procedure 11/27/2020   Hx of Cardiac arrest ([place]) 11/26/2020   Abnormality of gait 03/16/2020   Sleep disturbance 03/16/2020   Oropharyngeal dysphagia 02/12/2020   Coronary artery disease involving native coronary artery of native heart without angina pectoris 12/12/2019   Ischemic cardiomyopathy 12/12/2019   Brain injury ([place])    Prediabetes    Anoxic brain injury ([place]) 11/05/2019   Dysphagia 11/05/2019   Physical deconditioning 11/05/2019   Acute delirium 11/05/2019   Respiratory failure ([place])    Encounter for central line placement    Ventricular fibrillation ([place]) 09/23/2019   Acute combined systolic and diastolic heart failure ([place]) 09/23/2019   Ventricular tachycardia, polymorphic ([place]) 09/23/2019   Pelvic pain affecting pregnancy 10/15/2015   Supervision of normal pregnancy in third trimester 09/28/2015   Poor weight gain of pregnancy 09/28/2015   Iron deficiency anemia of pregnancy 09/01/2015   Increased BMI (body mass index) 07/29/2015    REFERRING DIAG: G93.1 (ICD-10-CM) - Anoxic brain injury ([place])  THERAPY DIAG:  Difficulty in walking, not elsewhere classified  Unsteadiness on feet  Abnormality of gait and mobility  Muscle weakness (generalized)  Rationale for Evaluation and Treatment Rehabilitation  PERTINENT HISTORY: Patient is a 36 year old with diagnois of anoxic brain injury. She was receiving outpatient PT until Feb. 2022 last year but ran out of visits. Patient has past medical  history of Anoxic Brain Injury 09/2019, CAD, HLD, HTN, ISCHEMIC CARDIMYOPATHY, STEMI, V-FIB, ICD on 11/29/2020  .On 09/23/19 patient presented to ED 2 weeks postpartum (had emergent C-Section) with chest pain. In ED sustained VFib Cardiac Arrest requiring intubation. EKG showed ST elevation, Cath Lab showed aneurysmal dissection of the LAD was identified. Course complicated by encephalopathy, anoxic brain injury, enterobacterial PNA. Patient is currently living with mom.  Mother reports she is abel to walk some with a walker with supervision but primarily ambulates with HHA of mother who is her guardian. Mother also reports she requires assist for all transfers and ADL's.   PRECAUTIONS: Fall  SUBJECTIVE: Patient presents with her father this date.  Patient father no significant changes from last session to his knowledge. Pt presents with improved motivation this date and was abel to respond to several questions with one word answers.   PAIN:  Are you having pain? No     TODAY'S TREATMENT:  12/22/21 *Heavy multimodal cues and tactile facilitation required for all mobility in session as well as intermittent modA for postural control and avoidance of LOB    TA Transfer training:  STS, 12 foot walk through doorway with turn, then STS, repeat walk and sit in starting chair  -seated rest at chair with seated therex as below  - RW, fatigue and increased difficulty on second rep compared to first  -Difficulty with movement sequencing continued but improved safety and instruction following this date.  -Pt also has difficulty with LE placement with this activity - x 6 total STS transitions and ambulation   STS  x 5 reps with 5# AW on ea LE to assist with maintaining appropriate foot positioning. Good response to this but still has difficulty following task to stand up.  -second round only able to complete 4 reps when fatigue limited completion of further repetitions.   Ambulation x 200 feet in clinic hallway. Pt with noted fatigue at end of ambulatory bout as evidenced by attempting to sit in floor.  Patient requires multimodal cueing for standing" extremity to walker patient tends to stand on right side of walker and push walker too far anteriorly resulting in a forward flexed trunk.  TE Using red  hedge hog per pt favorite color  -march 5x; 2 sets completed with 5# AW    -LAQ   5* 2 sets ea LE with goal of kicking hedgehog, cues required frequently, also  use red hedgehog to kick for coordination of LE training. Improved efficacy with this task this date.     HABD to PT pressure holding hedgehog 2 x 5 reps ea LE   Max cues required throughout for sustained attention of pt to task at hand.      HOME EXERCISE PROGRAM: Access Code: [facility] URL: [email or web address] Date: 07/28/2021 Prepared by: Rivka Barbara  Exercises - Seated Long Arc Quad  - 1 x daily - 7 x weekly - 2 sets - 10 reps -Sit to stand- 1 x daily - 7 x weekly - 2 sets - 10 reps  Marching: 1 x daily - 7 x weekly - 2 sets - 10 reps   Access Code: [email or web address] URL: [email or web address] Date: 08/04/2021 Prepared by: Janna Arch  Exercises - Key Pinch with Putty  - 1 x daily - 7 x weekly - 2 sets - 10 reps - 5 hold - Hand Squeezes  - 1 x daily - 7 x weekly - 2 sets - 10 reps - 5 hold - Tip Pinch with  Putty  - 1 x daily - 7 x weekly - 2 sets - 10 reps - 5 hold   PT Short Term Goals -       PT SHORT TERM GOAL #1   Title Patient will perform all transfers with Stand by assist using least restrictive AD,  BUE Support, and reaching back for safety to improve her functional independence and decrease fall risk.    Baseline 07/21/21: requires Min A and significant UE utilizaiton  9/14: SBA and UE use but completes safely.    Time 6    Period Weeks    Status Met, performing transfers to and from transport chair to armchair utilizing 7 upper extremity assist able to do it independently and safely with only standby assist from physical therapist             PT [place] #1   Title Patient/caregiver will report indepence with HEP with caregiver assistance.    Baseline 6/15: not copmelting HEP regularly/ father unsure of home program/ activities  9/14: been completing regularly    Time 8    Period Weeks    Status MET   Target Date 09/15/21      PT LONG TERM GOAL #2   Title Pt will decrease 5XSTS with UE use  as needed by at least  15 seconds in order to demonstrate clinically significant improvement in LE strength.    Baseline 6/15: 61.54 sec with UE A 9/14: 25.01 sec, UE assist, LE trembling   Time 12    Period Weeks    Status ONGOING    Target Date 01/12/2022      PT LONG TERM GOAL #3   Title Pt will decrease TUG to below 34 seconds/decrease in order to demonstrate decreased fall risk.    Baseline 03/17/2021= 43.49 sec using RW 9/14: 40.6 sec    Time 12    Period Weeks    Status In ONGOING    Target Date 01/12/2022      PT LONG TERM GOAL #4   Title Patient will demo ability to ambulate > 400 ft w/ LRAD versus hand held assist min assist to demonstrate improved household mobility and short community distances.    Baseline 03/17/2021= 125 feet with Min A and RW and cues for proximity to walker 9/14: 225 ft in 3:44 sec    Time 12    Period Weeks    Status ONGOING    Target Date 01/12/2022      PT LONG TERM GOAL #5   Title Pt will improve FOTO to target score to display perceived improvements in ability to complete ADL's.    Baseline 6/15: no assessed secondary to father not knowing answers to questions, ptsent home with QR code for mother to complete survey at home.    Time 12    Period Weeks    Status Defer to next visit, QR code sent with father to give to mother for filling out. 9/14: pt mother performing at home between sessions 9/21: 47 ( 4 point increase)    Target Date 01/12/2022      PT LONG TERM GOAL #6   Title Pt will increase 10MWT by at least 0.15 m/s in order to demonstrate clinically significant improvement in community ambulation.    Baseline 03/17/2021= 0.24 m/s using RW and min A from PT for walker management  9/14: .94ms   Time 12  Period Weeks    Status ONGOING    Target Date 01/12/2022               Plan -     Clinical Impression Statement Patient presents to physical therapy with her father this date. Pt demonstrated improved ability to follow instruction this  date compared to previous session and improved efficacy with stand to sit transitions.  Patient continues to require multimodal cueing for proper performance. Continued with 5# AW on Les to improve STS efficacy with good results and improved consistency of foot location for STS transitions, pt still showing quickness to fatigue with functional activities. Pt also ambulates further this date with ambulation tasks without need to sit secondary to fatigue. Pt will continue to benefit from skilled physical therapy intervention to address impairments, improve QOL, and attain therapy goals.     Personal Factors and Comorbidities Behavior Pattern;Transportation;Comorbidity 3+    Comorbidities HTN, CAD, HLD    Examination-Activity Limitations Bed Mobility;Dressing;Hygiene/Grooming;Caring for Others;Stairs;Stand;Toileting;Transfers;Sit;Bathing;Continence;Lift;Squat;Reach Overhead    Examination-Participation Restrictions Occupation;Medication Management;Cleaning;Yard Work;Personal Finances;Driving    Stability/Clinical Decision Making Evolving/Moderate complexity    Rehab Potential Good    PT Frequency 1x / week    PT Duration 12 weeks    PT Treatment/Interventions ADLs/Self Care Home Management;Aquatic Therapy;Electrical Stimulation;DME Instruction;Gait training;Stair training;Functional mobility training;Therapeutic activities;Therapeutic exercise;Balance training;Neuromuscular re-education;Wheelchair mobility training;Patient/family education;Orthotic Fit/Training;Manual techniques;Passive range of motion;Cryotherapy;Moist Heat    PT Next Visit Plan Functional mobility training, cognitive remediation tasks   PT Home Exercise Plan Access Code: [email or web address]    Consulted and Agree with Plan of Care Patient;Family member/caregiver    Family Member Consulted Dad           4:53 PM, 12/22/21    Particia Lather, PT 12/22/2021, 4:53 PM

## 2021-12-22 NOTE — Progress Notes (Signed)
Remote ICD transmission.   

## 2021-12-23 ENCOUNTER — Ambulatory Visit: Payer: Medicaid Other | Admitting: Internal Medicine

## 2022-01-04 NOTE — Therapy (Unsigned)
OUTPATIENT PHYSICAL THERAPY TREATMENT NOTE      Patient Name: Beverly [name] MRN: [id number] DOB:March 05, 1985, 36 y.o., female Today's Date: 01/05/2022  PCP: Ane Payment, [place] REFERRING PROVIDER: Meredith Staggers, MD   PT End of Session - 01/05/22 1426     Visit Number 17    Number of Visits 20    Date for PT Re-Evaluation 01/12/22    Authorization Type Medicaid approval- 47 visits for OT/PT/SLP combined    Authorization Time Period Auth: 9/14-12/6 12 visits    Authorization - Visit Number 10    Authorization - Number of Visits 12    Progress Note Due on Visit 20    PT Start Time 1430    PT Stop Time 1512    PT Time Calculation (min) 42 min    Equipment Utilized During Treatment Gait belt    Activity Tolerance Patient tolerated treatment well;No increased pain    Behavior During Therapy Impulsive;Restless                    Past Medical History:  Diagnosis Date   Brain injury [facility])    CAD (coronary artery disease)    HLD (hyperlipidemia)    Hypertension    last pregnancy   Ischemic cardiomyopathy    STEMI (ST elevation myocardial infarction) ([place]) 09/2019   SCAD with aneurysmal dilation of proximal LAD.   Vaginal Pap smear, abnormal    when she was 36yo   Ventricular fibrillation ([place]) 09/23/2019   Past Surgical History:  Procedure Laterality Date   CARDIAC CATHETERIZATION     IR GASTROSTOMY TUBE MOD SED  10/07/2019   IR GASTROSTOMY TUBE REMOVAL  07/02/2020   IR [place] GASTRO/COLONIC TUBE PERCUT W/FLUORO  11/03/2021   LEFT HEART CATH AND CORONARY ANGIOGRAPHY N/A 09/23/2019   Procedure: LEFT HEART CATH AND CORONARY ANGIOGRAPHY;  Surgeon: Nelva Bush, MD;  Location: [place];  Service: Cardiovascular;  Laterality: N/A;   SUBQ ICD IMPLANT N/A 11/26/2020   Procedure: SUBQ ICD IMPLANT;  Surgeon: Vickie Epley, MD;  Location: [place] [facility];  Service: Cardiovascular;  Laterality: N/A;   Patient Active Problem List    Diagnosis Date Noted   Apraxia 10/05/2021   S/P ICD (internal cardiac defibrillator) procedure 11/27/2020   Hx of Cardiac arrest ([place]) 11/26/2020   Abnormality of gait 03/16/2020   Sleep disturbance 03/16/2020   Oropharyngeal dysphagia 02/12/2020   Coronary artery disease involving native coronary artery of native heart without angina pectoris 12/12/2019   Ischemic cardiomyopathy 12/12/2019   Brain injury ([place])    Prediabetes    Anoxic brain injury ([place]) 11/05/2019   Dysphagia 11/05/2019   Physical deconditioning 11/05/2019   Acute delirium 11/05/2019   Respiratory failure ([place])    Encounter for central line placement    Ventricular fibrillation ([place]) 09/23/2019   Acute combined systolic and diastolic heart failure ([place]) 09/23/2019   Ventricular tachycardia, polymorphic ([place]) 09/23/2019   Pelvic pain affecting pregnancy 10/15/2015   Supervision of normal pregnancy in third trimester 09/28/2015   Poor weight gain of pregnancy 09/28/2015   Iron deficiency anemia of pregnancy 09/01/2015   Increased BMI (body mass index) 07/29/2015    REFERRING DIAG: G93.1 (ICD-10-CM) - Anoxic brain injury ([place])  THERAPY DIAG:  Difficulty in walking, not elsewhere classified  Unsteadiness on feet  Abnormality of gait and mobility  Other abnormalities of gait and mobility  Rationale for Evaluation and Treatment Rehabilitation  PERTINENT HISTORY: Patient is a 36  year old with diagnois of anoxic brain injury. She was receiving outpatient PT until Feb. 2022 last year but ran out of visits. Patient has past medical history of Anoxic Brain Injury 09/2019, CAD, HLD, HTN, ISCHEMIC CARDIMYOPATHY, STEMI, V-FIB, ICD on 11/29/2020  .On 09/23/19 patient presented to ED 2 weeks postpartum (had emergent C-Section) with chest pain. In ED sustained VFib Cardiac Arrest requiring intubation. EKG showed ST elevation, Cath Lab showed aneurysmal dissection of the LAD was identified. Course complicated by encephalopathy,  anoxic brain injury, enterobacterial PNA. Patient is currently living with mom. Mother reports she is abel to walk some with a walker with supervision but primarily ambulates with HHA of mother who is her guardian. Mother also reports she requires assist for all transfers and ADL's.   PRECAUTIONS: Fall  SUBJECTIVE: Patient presents with her father this date.  Patient father no significant changes from last session to his knowledge.   PAIN:  Are you having pain? No     TODAY'S TREATMENT:  01/05/22 *Heavy multimodal cues and tactile facilitation required for all mobility in session as well as intermittent modA for postural control and avoidance of LOB    TA  STS 2 x 5 reps, intermittent UE use, multimodal cueing required for each. with 5# AW on ea LE to assist with maintaining appropriate foot positioning. Good response to this but still has difficulty following task to stand up.   Transfer training:  STS, 12 foot walk through doorway with turn, then STS, repeat walk and sit in starting chair, targeting safety and efficacy with transfers within the home environment.  -seated rest at chair with seated therex as below  -Pt also has difficulty with LE placement with this activity - x 6 total STS transitions and ambulation   Attempts retro walking with walker but unable to process walker navigation with this task. - moved to // bars and completed 2 lengths in here, pt has difficulty moving hands wit hretro ambulaiton and requires significant cues for proper performance.    Ambulation x 233 feet in clinic hallway. Pt with noted fatigue at end of ambulatory bout as evidenced by attempting to sit in floor.  Patient requires multimodal cueing for standing" extremity to walker patient tends to stand on right side of walker and push walker too far anteriorly resulting in a forward flexed trunk.  TE Using red  hedge hog per pt favorite color  -march *20 with hedgehog for external focus for  activity     -LAQ   2*10 sets ea LE with goal of kicking hedgehog, cues required frequently, also use red hedgehog to kick for coordination of LE training. Improved efficacy with this task this date.      Max cues required throughout for sustained attention of pt to task at hand.      HOME EXERCISE PROGRAM: Access Code: [facility] URL: [email or web address] Date: 07/28/2021 Prepared by: Rivka Barbara  Exercises - Seated Long Arc Quad  - 1 x daily - 7 x weekly - 2 sets - 10 reps -Sit to stand- 1 x daily - 7 x weekly - 2 sets - 10 reps  Marching: 1 x daily - 7 x weekly - 2 sets - 10 reps   Access Code: [email or web address] URL: [email or web address] Date: 08/04/2021 Prepared by: Janna Arch  Exercises - Key Pinch with Putty  - 1 x daily - 7 x weekly - 2 sets - 10 reps - 5 hold - Hand Squeezes  - 1 x daily - 7  x weekly - 2 sets - 10 reps - 5 hold - Tip Pinch with Putty  - 1 x daily - 7 x weekly - 2 sets - 10 reps - 5 hold   PT Short Term Goals -       PT SHORT TERM GOAL #1   Title Patient will perform all transfers with Stand by assist using least restrictive AD,  BUE Support, and reaching back for safety to improve her functional independence and decrease fall risk.    Baseline 07/21/21: requires Min A and significant UE utilizaiton  9/14: SBA and UE use but completes safely.    Time 6    Period Weeks    Status Met, performing transfers to and from transport chair to armchair utilizing 7 upper extremity assist able to do it independently and safely with only standby assist from physical therapist             PT [place] #1   Title Patient/caregiver will report indepence with HEP with caregiver assistance.    Baseline 6/15: not copmelting HEP regularly/ father unsure of home program/ activities  9/14: been completing regularly    Time 8    Period Weeks    Status MET   Target Date 09/15/21      PT LONG TERM GOAL #2    Title Pt will decrease 5XSTS with UE use as needed by at least  15 seconds in order to demonstrate clinically significant improvement in LE strength.    Baseline 6/15: 61.54 sec with UE A 9/14: 25.01 sec, UE assist, LE trembling   Time 12    Period Weeks    Status ONGOING    Target Date 01/12/2022      PT LONG TERM GOAL #3   Title Pt will decrease TUG to below 34 seconds/decrease in order to demonstrate decreased fall risk.    Baseline 03/17/2021= 43.49 sec using RW 9/14: 40.6 sec    Time 12    Period Weeks    Status In ONGOING    Target Date 01/12/2022      PT LONG TERM GOAL #4   Title Patient will demo ability to ambulate > 400 ft w/ LRAD versus hand held assist min assist to demonstrate improved household mobility and short community distances.    Baseline 03/17/2021= 125 feet with Min A and RW and cues for proximity to walker 9/14: 225 ft in 3:44 sec    Time 12    Period Weeks    Status ONGOING    Target Date 01/12/2022      PT LONG TERM GOAL #5   Title Pt will improve FOTO to target score to display perceived improvements in ability to complete ADL's.    Baseline 6/15: no assessed secondary to father not knowing answers to questions, ptsent home with QR code for mother to complete survey at home.    Time 12    Period Weeks    Status Defer to next visit, QR code sent with father to give to mother for filling out. 9/14: pt mother performing at home between sessions 9/21: 47 ( 4 point increase)    Target Date 01/12/2022      PT LONG TERM GOAL #6   Title Pt will increase 10MWT by at least 0.15 m/s in order to demonstrate clinically significant improvement in community ambulation.    Baseline 03/17/2021= 0.24 m/s using RW and min A  from PT for walker management  9/14: .50ms   Time 12    Period Weeks    Status ONGOING    Target Date 01/12/2022               Plan -     Clinical Impression Statement Patient presents to physical therapy with her father this date. Pt demonstrated  improved ability to follow instruction this date compared to previous session and improved efficacy with stand to sit transitions.  Patient continues to require multimodal cueing for proper performance. Continued with 5# AW on Les to improve STS efficacy with good results and improved consistency of foot location for STS transitions, pt still showing quickness to fatigue with functional activities. Pt also ambulates further this date with ambulation tasks without need to sit secondary to fatigue. Pt also shows some of her difficulty with STS transitions may come from poor efficacy from retro ambulation with walker and this may be good therapeutic intervention for future sessions. Pt will continue to benefit from skilled physical therapy intervention to address impairments, improve QOL, and attain therapy goals.     Personal Factors and Comorbidities Behavior Pattern;Transportation;Comorbidity 3+    Comorbidities HTN, CAD, HLD    Examination-Activity Limitations Bed Mobility;Dressing;Hygiene/Grooming;Caring for Others;Stairs;Stand;Toileting;Transfers;Sit;Bathing;Continence;Lift;Squat;Reach Overhead    Examination-Participation Restrictions Occupation;Medication Management;Cleaning;Yard Work;Personal Finances;Driving    Stability/Clinical Decision Making Evolving/Moderate complexity    Rehab Potential Good    PT Frequency 1x / week    PT Duration 12 weeks    PT Treatment/Interventions ADLs/Self Care Home Management;Aquatic Therapy;Electrical Stimulation;DME Instruction;Gait training;Stair training;Functional mobility training;Therapeutic activities;Therapeutic exercise;Balance training;Neuromuscular re-education;Wheelchair mobility training;Patient/family education;Orthotic Fit/Training;Manual techniques;Passive range of motion;Cryotherapy;Moist Heat    PT Next Visit Plan Functional mobility training, cognitive remediation tasks   PT Home Exercise Plan Access Code: [email or web address]   Consulted and Agree with  Plan of Care Patient;Family member/caregiver    Family Member Consulted Dad           2:59 PM, 01/05/22    CParticia Lather PT 01/05/2022, 2:59 PM

## 2022-01-05 ENCOUNTER — Encounter: Payer: Self-pay | Admitting: Physical Therapy

## 2022-01-05 ENCOUNTER — Ambulatory Visit: Payer: Medicaid Other | Admitting: Physical Therapy

## 2022-01-05 DIAGNOSIS — R2689 Other abnormalities of gait and mobility: Secondary | ICD-10-CM

## 2022-01-05 DIAGNOSIS — R262 Difficulty in walking, not elsewhere classified: Secondary | ICD-10-CM

## 2022-01-05 DIAGNOSIS — R2681 Unsteadiness on feet: Secondary | ICD-10-CM

## 2022-01-05 DIAGNOSIS — R269 Unspecified abnormalities of gait and mobility: Secondary | ICD-10-CM

## 2022-01-12 ENCOUNTER — Ambulatory Visit: Payer: Medicaid Other | Attending: Physical Medicine & Rehabilitation | Admitting: Physical Therapy

## 2022-01-12 DIAGNOSIS — R262 Difficulty in walking, not elsewhere classified: Secondary | ICD-10-CM | POA: Insufficient documentation

## 2022-01-12 DIAGNOSIS — R2689 Other abnormalities of gait and mobility: Secondary | ICD-10-CM | POA: Diagnosis present

## 2022-01-12 DIAGNOSIS — R269 Unspecified abnormalities of gait and mobility: Secondary | ICD-10-CM | POA: Insufficient documentation

## 2022-01-12 DIAGNOSIS — M6281 Muscle weakness (generalized): Secondary | ICD-10-CM | POA: Diagnosis present

## 2022-01-12 DIAGNOSIS — R2681 Unsteadiness on feet: Secondary | ICD-10-CM | POA: Diagnosis present

## 2022-01-12 NOTE — Therapy (Signed)
OUTPATIENT PHYSICAL THERAPY TREATMENT NOTE / [place] NOTE       Patient Name: Beverly [name] MRN: [id number] DOB:04/03/1985, 36 y.o., female Today's Date: 01/13/2022  PCP: Ane Payment, [place] REFERRING PROVIDER: Meredith Staggers, MD   PT End of Session - 01/13/22 0850     Visit Number 18    Number of Visits 20    Date for PT Re-Evaluation 01/12/22    Authorization Type Medicaid approval- 33 visits for OT/PT/SLP combined    Authorization Time Period Auth: 9/14-12/6 12 visits    Authorization - Visit Number 1    Authorization - Number of Visits 2    Progress Note Due on Visit 20    PT Start Time 9450    PT Stop Time 1555    PT Time Calculation (min) 40 min    Equipment Utilized During Treatment Gait belt    Activity Tolerance Patient tolerated treatment well;No increased pain    Behavior During Therapy Impulsive;Restless                     Past Medical History:  Diagnosis Date   Brain injury [facility])    CAD (coronary artery disease)    HLD (hyperlipidemia)    Hypertension    last pregnancy   Ischemic cardiomyopathy    STEMI (ST elevation myocardial infarction) ([place]) 09/2019   SCAD with aneurysmal dilation of proximal LAD.   Vaginal Pap smear, abnormal    when she was 36yo   Ventricular fibrillation ([place]) 09/23/2019   Past Surgical History:  Procedure Laterality Date   CARDIAC CATHETERIZATION     IR GASTROSTOMY TUBE MOD SED  10/07/2019   IR GASTROSTOMY TUBE REMOVAL  07/02/2020   IR [place] GASTRO/COLONIC TUBE PERCUT W/FLUORO  11/03/2021   LEFT HEART CATH AND CORONARY ANGIOGRAPHY N/A 09/23/2019   Procedure: LEFT HEART CATH AND CORONARY ANGIOGRAPHY;  Surgeon: Nelva Bush, MD;  Location: [place] [facility];  Service: Cardiovascular;  Laterality: N/A;   SUBQ ICD IMPLANT N/A 11/26/2020   Procedure: SUBQ ICD IMPLANT;  Surgeon: Vickie Epley, MD;  Location: [place] [facility];  Service: Cardiovascular;  Laterality: N/A;   Patient Active  Problem List   Diagnosis Date Noted   Apraxia 10/05/2021   S/P ICD (internal cardiac defibrillator) procedure 11/27/2020   Hx of Cardiac arrest ([place]) 11/26/2020   Abnormality of gait 03/16/2020   Sleep disturbance 03/16/2020   Oropharyngeal dysphagia 02/12/2020   Coronary artery disease involving native coronary artery of native heart without angina pectoris 12/12/2019   Ischemic cardiomyopathy 12/12/2019   Brain injury ([place])    Prediabetes    Anoxic brain injury ([place]) 11/05/2019   Dysphagia 11/05/2019   Physical deconditioning 11/05/2019   Acute delirium 11/05/2019   Respiratory failure ([place])    Encounter for central line placement    Ventricular fibrillation ([place]) 09/23/2019   Acute combined systolic and diastolic heart failure ([place]) 09/23/2019   Ventricular tachycardia, polymorphic ([place]) 09/23/2019   Pelvic pain affecting pregnancy 10/15/2015   Supervision of normal pregnancy in third trimester 09/28/2015   Poor weight gain of pregnancy 09/28/2015   Iron deficiency anemia of pregnancy 09/01/2015   Increased BMI (body mass index) 07/29/2015    REFERRING DIAG: G93.1 (ICD-10-CM) - Anoxic brain injury ([place])  THERAPY DIAG:  Difficulty in walking, not elsewhere classified  Unsteadiness on feet  Abnormality of gait and mobility  Other abnormalities of gait and mobility  Muscle weakness (generalized)  Rationale for Evaluation and  Treatment Rehabilitation  PERTINENT HISTORY: Patient is a 36 year old with diagnois of anoxic brain injury. She was receiving outpatient PT until Feb. 2022 last year but ran out of visits. Patient has past medical history of Anoxic Brain Injury 09/2019, CAD, HLD, HTN, ISCHEMIC CARDIMYOPATHY, STEMI, V-FIB, ICD on 11/29/2020  .On 09/23/19 patient presented to ED 2 weeks postpartum (had emergent C-Section) with chest pain. In ED sustained VFib Cardiac Arrest requiring intubation. EKG showed ST elevation, Cath Lab showed aneurysmal dissection of the LAD was  identified. Course complicated by encephalopathy, anoxic brain injury, enterobacterial PNA. Patient is currently living with mom. Mother reports she is abel to walk some with a walker with supervision but primarily ambulates with HHA of mother who is her guardian. Mother also reports she requires assist for all transfers and ADL's.   PRECAUTIONS: Fall  SUBJECTIVE: Patient presents with her father this date.  Patient father no significant changes from last session to his knowledge.   PAIN:  Are you having pain? No     TODAY'S TREATMENT:  01/12/22 Physical therapy treatment session today consisted of completing assessment of goals and administration of testing as demonstrated and documented in flow sheet, treatment, and goals section of this note. Addition treatments may be found below.   *Heavy multimodal cues and tactile facilitation required for all mobility in session as well as intermittent modA for postural control and avoidance of LOB  Max cues required throughout for sustained attention of pt to task at hand.      HOME EXERCISE PROGRAM: Access Code: [facility] URL: [email or web address] Date: 07/28/2021 Prepared by: Rivka Barbara  Exercises - Seated Long Arc Quad  - 1 x daily - 7 x weekly - 2 sets - 10 reps -Sit to stand- 1 x daily - 7 x weekly - 2 sets - 10 reps  Marching: 1 x daily - 7 x weekly - 2 sets - 10 reps   Access Code: [email or web address] URL: [email or web address] Date: 08/04/2021 Prepared by: Janna Arch  Exercises - Key Pinch with Putty  - 1 x daily - 7 x weekly - 2 sets - 10 reps - 5 hold - Hand Squeezes  - 1 x daily - 7 x weekly - 2 sets - 10 reps - 5 hold - Tip Pinch with Putty  - 1 x daily - 7 x weekly - 2 sets - 10 reps - 5 hold   PT Short Term Goals -       PT SHORT TERM GOAL #1   Title Patient will perform all transfers with Stand by assist using least restrictive AD,  BUE Support, and reaching back for safety to improve her  functional independence and decrease fall risk.    Baseline 07/21/21: requires Min A and significant UE utilizaiton  9/14: SBA and UE use but completes safely.    Time 6    Period Weeks    Status Met, performing transfers to and from transport chair to armchair utilizing B upper extremity assist able to do it independently and safely with only standby assist from physical therapist             PT [place] #1   Title Patient/caregiver will report indepence with HEP with caregiver assistance.    Baseline 6/15: not copmelting HEP regularly/ father unsure of home program/ activities  9/14: been completing regularly    Time 8    Period Weeks  Status MET   Target Date 09/15/21      PT LONG TERM GOAL #2   Title Pt will decrease 5XSTS with UE use as needed by at least  15 seconds in order to demonstrate clinically significant improvement in LE strength.    Baseline 6/15: 61.54 sec with UE A 9/14: 25.01 sec, UE assist, LE trembling 12/7:1:11, difficulty with attention to task and potentially fatigue from walking endurance testing prior to this test. Hard to gauge fatigue secondary to pts communication difficulties.    Time 12    Period Weeks    Status Regressed    Target Date 04/07/2022        PT LONG TERM GOAL #3   Title Pt will decrease TUG to below 34 seconds/decrease in order to demonstrate decreased fall risk.    Baseline 03/17/2021= 43.49 sec using RW 9/14: 40.6 sec  12/7: 32.8 sec   Time 12    Period Weeks    Status ONGOING    Target Date 04/07/2022        PT LONG TERM GOAL #4   Title Patient will demo ability to ambulate > 400 ft w/ LRAD versus hand held assist min assist to demonstrate improved household mobility and short community distances.    Baseline 03/17/2021= 125 feet with Min A and RW and cues for proximity to walker 9/14: 225 ft in 3:44 sec  12/7: 310 feet, motivation required, cues for proximity to walker throughout. Pt oushes walker to  right consistently and has to make adjustments frequently to keep forward line of progression. Pt also has difficulty with kicking walker with R LE.    Time 12    Period Weeks    Status ONGOING    Target Date 04/07/2022        PT LONG TERM GOAL #5   Title Pt will improve FOTO to target score to display perceived improvements in ability to complete ADL's.    Baseline 6/15: no assessed secondary to father not knowing answers to questions, ptsent home with QR code for mother to complete survey at home.    Time 12    Period Weeks    Status Defer to next visit, QR code sent with father to give to mother for filling out. 9/14: pt mother performing at home between sessions 9/21: 47 ( 4 point increase) 12/8: Met   Target Date 01/12/2022      PT LONG TERM GOAL #6   Title Pt will increase 10MWT by at least 0.15 m/s in order to demonstrate clinically significant improvement in community ambulation.    Baseline 03/17/2021= 0.24 m/s using RW and min A from PT for walker management  9/14: .93ms 12/7: .393m, assistance with maintining strainght line of progression.    Time 12    Period Weeks    Status ONGOING    Target Date 04/07/2022                 Plan -     Clinical Impression Statement Pt presents to PT for re certification note this date.  Patient demonstrates progress with her walking endurance although her walking safety continues to be a concern as when patient fatigues she requires cues for proximity to walker and also continues to push walker to right is difficulty concentrating with forward line of progression with ambulation.  Patient's endurance and ambulation is improving as evidenced by improved walking distance without requiring need to sit but fatigue is evident likely in her gluteal  musculature as she tends to bend forward with fatigue.  Patient also makes progress with timed up and go test as well as 10 m walk test with 10 m walk patient does require some assistance with forward  lateral progression as she tends to ambulate to the right has difficulty ambulating in a straight line.  Is been difficult to train this is patient's understanding of instructions and is very limited as well as with her attentiveness to task is very limited as she is distracted significantly by her environment.  Patient is continuing to make progress with physical therapy will continue to benefit from skilled physical therapy interventions at this time.  The only goal patient did not make significant progress toward this date was her goal for 5 times sit to stand and this may be secondary to fatigue from prolonged ambulation test that was completed prior to 5 times sit to stand test.  Patient's condition has the potential to improve in response to therapy. Maximum improvement is yet to be obtained. The anticipated improvement is attainable and reasonable in a generally predictable time.     Personal Factors and Comorbidities Behavior Pattern;Transportation;Comorbidity 3+    Comorbidities HTN, CAD, HLD    Examination-Activity Limitations Bed Mobility;Dressing;Hygiene/Grooming;Caring for Others;Stairs;Stand;Toileting;Transfers;Sit;Bathing;Continence;Lift;Squat;Reach Overhead    Examination-Participation Restrictions Occupation;Medication Management;Cleaning;Yard Work;Personal Finances;Driving    Stability/Clinical Decision Making Evolving/Moderate complexity    Rehab Potential Good    PT Frequency 1x / week    PT Duration 12 weeks    PT Treatment/Interventions ADLs/Self Care Home Management;Aquatic Therapy;Electrical Stimulation;DME Instruction;Gait training;Stair training;Functional mobility training;Therapeutic activities;Therapeutic exercise;Balance training;Neuromuscular re-education;Wheelchair mobility training;Patient/family education;Orthotic Fit/Training;Manual techniques;Passive range of motion;Cryotherapy;Moist Heat    PT Next Visit Plan Functional mobility training, cognitive remediation tasks    PT Home Exercise Plan Access Code: [email or web address]    Consulted and Agree with Plan of Care Patient;Family member/caregiver    Family Member Consulted Dad           8:51 AM, 01/13/22    Particia Lather, PT 01/13/2022, 8:51 AM

## 2022-01-19 ENCOUNTER — Ambulatory Visit: Payer: Medicaid Other | Admitting: Physical Therapy

## 2022-01-19 ENCOUNTER — Encounter: Payer: Self-pay | Admitting: Physical Therapy

## 2022-01-19 DIAGNOSIS — R262 Difficulty in walking, not elsewhere classified: Secondary | ICD-10-CM | POA: Diagnosis not present

## 2022-01-19 DIAGNOSIS — R269 Unspecified abnormalities of gait and mobility: Secondary | ICD-10-CM

## 2022-01-19 DIAGNOSIS — M6281 Muscle weakness (generalized): Secondary | ICD-10-CM

## 2022-01-19 DIAGNOSIS — R2689 Other abnormalities of gait and mobility: Secondary | ICD-10-CM

## 2022-01-19 DIAGNOSIS — R2681 Unsteadiness on feet: Secondary | ICD-10-CM

## 2022-01-19 NOTE — Therapy (Signed)
OUTPATIENT PHYSICAL THERAPY TREATMENT NOTE        Patient Name: Beverly [name] MRN: [id number] DOB:05-Sep-1985, 36 y.o., female Today's Date: 01/19/2022  PCP: Ane Payment, [place] REFERRING PROVIDER: Meredith Staggers, MD   PT End of Session - 01/19/22 1514     Visit Number 19    Number of Visits 20    Date for PT Re-Evaluation 04/06/22    Authorization Type Medicaid approval- 86 visits for OT/PT/SLP combined    Authorization Time Period Auth: 9/14-12/6 12 visits    Authorization - Visit Number 2    Authorization - Number of Visits 2    Progress Note Due on Visit 20    PT Start Time 3382    PT Stop Time 1555    PT Time Calculation (min) 39 min    Equipment Utilized During Treatment Gait belt    Activity Tolerance Patient tolerated treatment well;No increased pain    Behavior During Therapy Impulsive;Restless                     Past Medical History:  Diagnosis Date   Brain injury [facility])    CAD (coronary artery disease)    HLD (hyperlipidemia)    Hypertension    last pregnancy   Ischemic cardiomyopathy    STEMI (ST elevation myocardial infarction) ([place]) 09/2019   SCAD with aneurysmal dilation of proximal LAD.   Vaginal Pap smear, abnormal    when she was 36yo   Ventricular fibrillation ([place]) 09/23/2019   Past Surgical History:  Procedure Laterality Date   CARDIAC CATHETERIZATION     IR GASTROSTOMY TUBE MOD SED  10/07/2019   IR GASTROSTOMY TUBE REMOVAL  07/02/2020   IR [place] GASTRO/COLONIC TUBE PERCUT W/FLUORO  11/03/2021   LEFT HEART CATH AND CORONARY ANGIOGRAPHY N/A 09/23/2019   Procedure: LEFT HEART CATH AND CORONARY ANGIOGRAPHY;  Surgeon: Nelva Bush, MD;  Location: [place] [facility];  Service: Cardiovascular;  Laterality: N/A;   SUBQ ICD IMPLANT N/A 11/26/2020   Procedure: SUBQ ICD IMPLANT;  Surgeon: Vickie Epley, MD;  Location: [place] [facility];  Service: Cardiovascular;  Laterality: N/A;   Patient Active Problem List    Diagnosis Date Noted   Apraxia 10/05/2021   S/P ICD (internal cardiac defibrillator) procedure 11/27/2020   Hx of Cardiac arrest ([place]) 11/26/2020   Abnormality of gait 03/16/2020   Sleep disturbance 03/16/2020   Oropharyngeal dysphagia 02/12/2020   Coronary artery disease involving native coronary artery of native heart without angina pectoris 12/12/2019   Ischemic cardiomyopathy 12/12/2019   Brain injury ([place])    Prediabetes    Anoxic brain injury ([place]) 11/05/2019   Dysphagia 11/05/2019   Physical deconditioning 11/05/2019   Acute delirium 11/05/2019   Respiratory failure ([place])    Encounter for central line placement    Ventricular fibrillation ([place]) 09/23/2019   Acute combined systolic and diastolic heart failure ([place]) 09/23/2019   Ventricular tachycardia, polymorphic ([place]) 09/23/2019   Pelvic pain affecting pregnancy 10/15/2015   Supervision of normal pregnancy in third trimester 09/28/2015   Poor weight gain of pregnancy 09/28/2015   Iron deficiency anemia of pregnancy 09/01/2015   Increased BMI (body mass index) 07/29/2015    REFERRING DIAG: G93.1 (ICD-10-CM) - Anoxic brain injury ([place])  THERAPY DIAG:  Difficulty in walking, not elsewhere classified  Unsteadiness on feet  Abnormality of gait and mobility  Other abnormalities of gait and mobility  Muscle weakness (generalized)  Rationale for Evaluation and Treatment Rehabilitation  PERTINENT HISTORY: Patient is a 36 year old with diagnois of anoxic brain injury. She was receiving outpatient PT until Feb. 2022 last year but ran out of visits. Patient has past medical history of Anoxic Brain Injury 09/2019, CAD, HLD, HTN, ISCHEMIC CARDIMYOPATHY, STEMI, V-FIB, ICD on 11/29/2020  .On 09/23/19 patient presented to ED 2 weeks postpartum (had emergent C-Section) with chest pain. In ED sustained VFib Cardiac Arrest requiring intubation. EKG showed ST elevation, Cath Lab showed aneurysmal dissection of the LAD was identified.  Course complicated by encephalopathy, anoxic brain injury, enterobacterial PNA. Patient is currently living with mom. Mother reports she is abel to walk some with a walker with supervision but primarily ambulates with HHA of mother who is her guardian. Mother also reports she requires assist for all transfers and ADL's.   PRECAUTIONS: Fall  SUBJECTIVE: Patient presents with her father this date.  Pt and father informed that last treatment date will be this date at least until next year secondary to insurance visit approval limitations. Pt father is understanding of this. Pt and caregiver will be informed next year regarding future visits.   PAIN:  Are you having pain? No     TODAY'S TREATMENT:  01/12/22  STS with 5# AW donned 2 x 5 reps   Activity Description: Blaze bod taps with UE without UE support  Activity Setting:  The Blaze Pod Random setting was chosen to enhance cognitive processing and agility, providing an unpredictable environment to simulate real-world scenarios, and fostering quick reactions and adaptability.   Number of Pods:  4 Cycles/Sets:  4 Duration (Time or Hit Count):  1 min   STS with 5 # AW 1 x 5 reps  -cues for hand placement and continued cues between each set for continuing of activities.   Worked on forward and retroambulation.  Veteran ambulation patient able to make some progress at this but still requires max assist for management of walker as when she ambulates posteriorly she extends her arms and has difficulty with processing task of reading walker towards her.  Patient also shift weight posteriorly significantly requiring max assist to prevent posterior loss of balance  Max cues required throughout for sustained attention of pt to task at hand.      HOME EXERCISE PROGRAM: Access Code: [facility] URL: [email or web address] Date: 07/28/2021 Prepared by: Rivka Barbara  Exercises - Seated Long Arc Quad  - 1 x daily - 7 x weekly - 2  sets - 10 reps -Sit to stand- 1 x daily - 7 x weekly - 2 sets - 10 reps  Marching: 1 x daily - 7 x weekly - 2 sets - 10 reps   Access Code: [email or web address] URL: [email or web address] Date: 08/04/2021 Prepared by: Janna Arch  Exercises - Key Pinch with Putty  - 1 x daily - 7 x weekly - 2 sets - 10 reps - 5 hold - Hand Squeezes  - 1 x daily - 7 x weekly - 2 sets - 10 reps - 5 hold - Tip Pinch with Putty  - 1 x daily - 7 x weekly - 2 sets - 10 reps - 5 hold   PT Short Term Goals -       PT SHORT TERM GOAL #1   Title Patient will perform all transfers with Stand by assist using least restrictive AD,  BUE Support, and reaching back for safety to improve her functional independence and decrease fall risk.    Baseline 07/21/21: requires Min A and significant UE  utilizaiton  9/14: SBA and UE use but completes safely.    Time 6    Period Weeks    Status Met, performing transfers to and from transport chair to armchair utilizing B upper extremity assist able to do it independently and safely with only standby assist from physical therapist             PT [place] #1   Title Patient/caregiver will report indepence with HEP with caregiver assistance.    Baseline 6/15: not copmelting HEP regularly/ father unsure of home program/ activities  9/14: been completing regularly    Time 8    Period Weeks    Status MET   Target Date 09/15/21      PT LONG TERM GOAL #2   Title Pt will decrease 5XSTS with UE use as needed by at least  15 seconds in order to demonstrate clinically significant improvement in LE strength.    Baseline 6/15: 61.54 sec with UE A 9/14: 25.01 sec, UE assist, LE trembling 12/7:1:11, difficulty with attention to task and potentially fatigue from walking endurance testing prior to this test. Hard to gauge fatigue secondary to pts communication difficulties.    Time 12    Period Weeks    Status Regressed    Target Date 04/07/2022         PT LONG TERM GOAL #3   Title Pt will decrease TUG to below 34 seconds/decrease in order to demonstrate decreased fall risk.    Baseline 03/17/2021= 43.49 sec using RW 9/14: 40.6 sec  12/7: 32.8 sec   Time 12    Period Weeks    Status ONGOING    Target Date 04/07/2022        PT LONG TERM GOAL #4   Title Patient will demo ability to ambulate > 400 ft w/ LRAD versus hand held assist min assist to demonstrate improved household mobility and short community distances.    Baseline 03/17/2021= 125 feet with Min A and RW and cues for proximity to walker 9/14: 225 ft in 3:44 sec  12/7: 310 feet, motivation required, cues for proximity to walker throughout. Pt pushes walker to right consistently and has to make adjustments frequently to keep forward line of progression. Pt also has difficulty with kicking walker with R LE.    Time 12    Period Weeks    Status ONGOING    Target Date 04/07/2022        PT LONG TERM GOAL #5   Title Pt will improve FOTO to target score to display perceived improvements in ability to complete ADL's.    Baseline 6/15: no assessed secondary to father not knowing answers to questions, ptsent home with QR code for mother to complete survey at home.    Time 12    Period Weeks    Status Defer to next visit, QR code sent with father to give to mother for filling out. 9/14: pt mother performing at home between sessions 9/21: 47 ( 4 point increase) 12/8: Met   Target Date 01/12/2022      PT LONG TERM GOAL #6   Title Pt will increase 10MWT by at least 0.15 m/s in order to demonstrate clinically significant improvement in community ambulation.    Baseline 03/17/2021= 0.24 m/s using RW and min A from PT for walker management  9/14: .24ms 12/7: .392m, assistance with maintining strainght line of progression.  Time 12    Period Weeks    Status ONGOING    Target Date 04/07/2022                 Plan -     Clinical Impression Statement Continued with current plan of care  as laid out in evaluation and recent prior sessions The patient adapted well to [facility] during the initial introduction but was still distracted by environment. Moving forward, we plan to integrate Blaze Pods into the rehabilitation process, using their dual-tasking technology for personalized exercises to enhance both physical and cognitive aspects of recovery.  Patient still has significant difficulty with retroambulation likely make it difficult for her to transition into sitting when utilizing walker.  Pt continues to demonstrate progress toward goals AEB progression of some interventions this date either in volume or intensity.     Personal Factors and Comorbidities Behavior Pattern;Transportation;Comorbidity 3+    Comorbidities HTN, CAD, HLD    Examination-Activity Limitations Bed Mobility;Dressing;Hygiene/Grooming;Caring for Others;Stairs;Stand;Toileting;Transfers;Sit;Bathing;Continence;Lift;Squat;Reach Overhead    Examination-Participation Restrictions Occupation;Medication Management;Cleaning;Yard Work;Personal Finances;Driving    Stability/Clinical Decision Making Evolving/Moderate complexity    Rehab Potential Good    PT Frequency 1x / week    PT Duration 12 weeks    PT Treatment/Interventions ADLs/Self Care Home Management;Aquatic Therapy;Electrical Stimulation;DME Instruction;Gait training;Stair training;Functional mobility training;Therapeutic activities;Therapeutic exercise;Balance training;Neuromuscular re-education;Wheelchair mobility training;Patient/family education;Orthotic Fit/Training;Manual techniques;Passive range of motion;Cryotherapy;Moist Heat    PT Next Visit Plan Functional mobility training, cognitive remediation tasks   PT Home Exercise Plan Access Code: [email or web address]    Consulted and Agree with Plan of Care Patient;Family member/caregiver    Family Member Consulted Dad           5:01 PM, 01/19/22    Particia Lather, PT 01/19/2022, 5:01 PM

## 2022-01-23 ENCOUNTER — Other Ambulatory Visit: Payer: Self-pay | Admitting: Physical Medicine & Rehabilitation

## 2022-01-23 DIAGNOSIS — R1312 Dysphagia, oropharyngeal phase: Secondary | ICD-10-CM

## 2022-01-23 DIAGNOSIS — R482 Apraxia: Secondary | ICD-10-CM

## 2022-01-23 DIAGNOSIS — G931 Anoxic brain damage, not elsewhere classified: Secondary | ICD-10-CM

## 2022-01-23 MED ORDER — METHYLPHENIDATE HCL 10 MG PO TABS: 15.0000 mg | ORAL_TABLET | Freq: Two times a day (BID) | ORAL | 0 refills | Status: DC

## 2022-01-23 NOTE — Telephone Encounter (Signed)
Patient should have a refill to be released on 01/25/2022. Mother advised to confirm availability with pharmacy.

## 2022-01-25 ENCOUNTER — Other Ambulatory Visit: Payer: Self-pay | Admitting: Physical Medicine & Rehabilitation

## 2022-01-25 DIAGNOSIS — R482 Apraxia: Secondary | ICD-10-CM

## 2022-01-25 DIAGNOSIS — G931 Anoxic brain damage, not elsewhere classified: Secondary | ICD-10-CM

## 2022-01-25 DIAGNOSIS — R1312 Dysphagia, oropharyngeal phase: Secondary | ICD-10-CM

## 2022-01-26 ENCOUNTER — Ambulatory Visit: Payer: Medicaid Other | Admitting: Physical Therapy

## 2022-01-26 NOTE — Telephone Encounter (Signed)
Patient mom called in due to not being able to get medication, per pmp last fill was 12/21/21, medicaid however is saying it is 19 days to early for a refill, patient was advised by pharmacist to call medicaid.

## 2022-02-08 ENCOUNTER — Ambulatory Visit: Payer: Medicaid Other | Admitting: Physical Therapy

## 2022-02-09 ENCOUNTER — Ambulatory Visit: Payer: Medicaid Other | Admitting: Physical Therapy

## 2022-02-13 ENCOUNTER — Encounter: Payer: Self-pay | Admitting: Physical Therapy

## 2022-02-13 ENCOUNTER — Ambulatory Visit: Payer: Medicaid Other | Attending: Physical Medicine & Rehabilitation | Admitting: Physical Therapy

## 2022-02-13 DIAGNOSIS — R262 Difficulty in walking, not elsewhere classified: Secondary | ICD-10-CM

## 2022-02-13 DIAGNOSIS — R2689 Other abnormalities of gait and mobility: Secondary | ICD-10-CM | POA: Diagnosis present

## 2022-02-13 DIAGNOSIS — M6281 Muscle weakness (generalized): Secondary | ICD-10-CM | POA: Insufficient documentation

## 2022-02-13 DIAGNOSIS — R269 Unspecified abnormalities of gait and mobility: Secondary | ICD-10-CM | POA: Insufficient documentation

## 2022-02-13 DIAGNOSIS — R2681 Unsteadiness on feet: Secondary | ICD-10-CM

## 2022-02-13 NOTE — Therapy (Signed)
OUTPATIENT PHYSICAL THERAPY TREATMENT NOTE  / Physical Therapy Progress Note   Dates of reporting period  11/10/21   to   02/13/22       Patient Name: Beverly [name] MRN: [id number] DOB:1985-06-22, 37 y.o., female Today's Date: 02/13/2022  PCP: Delman Cheadle, PA REFERRING PROVIDER: Ranelle Oyster, MD   PT End of Session - 02/13/22 1100     Visit Number 20    Number of Visits 20    Date for PT Re-Evaluation 04/06/22    Authorization Type Medicaid approval- 27 visits for OT/PT/SLP combined    Authorization Time Period Auth: 9/14-12/6 12 visits    Authorization - Visit Number 1    Authorization - Number of Visits 3    Progress Note Due on Visit 20    PT Start Time 1103    PT Stop Time 1145    PT Time Calculation (min) 42 min    Equipment Utilized During Treatment Gait belt    Activity Tolerance Patient tolerated treatment well;No increased pain    Behavior During Therapy Impulsive;Restless                      Past Medical History:  Diagnosis Date   Brain injury [facility])    CAD (coronary artery disease)    HLD (hyperlipidemia)    Hypertension    last pregnancy   Ischemic cardiomyopathy    STEMI (ST elevation myocardial infarction) (HCC) 09/2019   SCAD with aneurysmal dilation of proximal LAD.   Vaginal Pap smear, abnormal    when she was 37yo   Ventricular fibrillation (HCC) 09/23/2019   Past Surgical History:  Procedure Laterality Date   CARDIAC CATHETERIZATION     IR GASTROSTOMY TUBE MOD SED  10/07/2019   IR GASTROSTOMY TUBE REMOVAL  07/02/2020   IR [place] GASTRO/COLONIC TUBE PERCUT W/FLUORO  11/03/2021   LEFT HEART CATH AND CORONARY ANGIOGRAPHY N/A 09/23/2019   Procedure: LEFT HEART CATH AND CORONARY ANGIOGRAPHY;  Surgeon: Yvonne Kendall, MD;  Location: [facility] INVASIVE CV LAB;  Service: Cardiovascular;  Laterality: N/A;   SUBQ ICD IMPLANT N/A 11/26/2020   Procedure: SUBQ ICD IMPLANT;  Surgeon: Lanier Prude, MD;  Location: [facility]  INVASIVE CV LAB;  Service: Cardiovascular;  Laterality: N/A;   Patient Active Problem List   Diagnosis Date Noted   Apraxia 10/05/2021   S/P ICD (internal cardiac defibrillator) procedure 11/27/2020   Hx of Cardiac arrest (HCC) 11/26/2020   Abnormality of gait 03/16/2020   Sleep disturbance 03/16/2020   Oropharyngeal dysphagia 02/12/2020   Coronary artery disease involving native coronary artery of native heart without angina pectoris 12/12/2019   Ischemic cardiomyopathy 12/12/2019   Brain injury (HCC)    Prediabetes    Anoxic brain injury (HCC) 11/05/2019   Dysphagia 11/05/2019   Physical deconditioning 11/05/2019   Acute delirium 11/05/2019   Respiratory failure (HCC)    Encounter for central line placement    Ventricular fibrillation (HCC) 09/23/2019   Acute combined systolic and diastolic heart failure (HCC) 09/23/2019   Ventricular tachycardia, polymorphic (HCC) 09/23/2019   Pelvic pain affecting pregnancy 10/15/2015   Supervision of normal pregnancy in third trimester 09/28/2015   Poor weight gain of pregnancy 09/28/2015   Iron deficiency anemia of pregnancy 09/01/2015   Increased BMI (body mass index) 07/29/2015    REFERRING DIAG: G93.1 (ICD-10-CM) - Anoxic brain injury (HCC)  THERAPY DIAG:  Difficulty in walking, not elsewhere classified  Unsteadiness on feet  Abnormality of gait  and mobility  Other abnormalities of gait and mobility  Muscle weakness (generalized)  Rationale for Evaluation and Treatment Rehabilitation  PERTINENT HISTORY: Patient is a 37 year old with diagnois of anoxic brain injury. She was receiving outpatient PT until Feb. 2022 last year but ran out of visits. Patient has past medical history of Anoxic Brain Injury 09/2019, CAD, HLD, HTN, ISCHEMIC CARDIMYOPATHY, STEMI, V-FIB, ICD on 11/29/2020  .On 09/23/19 patient presented to ED 2 weeks postpartum (had emergent C-Section) with chest pain. In ED sustained VFib Cardiac Arrest requiring intubation.  EKG showed ST elevation, Cath Lab showed aneurysmal dissection of the LAD was identified. Course complicated by encephalopathy, anoxic brain injury, enterobacterial PNA. Patient is currently living with mom. Mother reports she is abel to walk some with a walker with supervision but primarily ambulates with HHA of mother who is her guardian. Mother also reports she requires assist for all transfers and ADL's.   PRECAUTIONS: Fall  SUBJECTIVE: Pt caregiver reports her DTR has days where she is up and down. Pt reports she does all she can at the house with her including stair navigation and ambulation and HEP.   PAIN:  Are you having pain? No     TODAY'S TREATMENT:02/13/22  Physical therapy treatment session today consisted of completing assessment of goals and administration of testing as demonstrated and documented in flow sheet, treatment, and goals section of this note. Addition treatments may be found below.    5 times sit to stand patient completes and 17 seconds but does have contact with chair with upper extremities throughout test.  Sit to stand followed by ambulation to treatment room followed by another sit to and from stand ambulation back to treatment area working on patient task sequencing as well as ambulation with walker.  Patient continues to push walker to the right and is difficulty maintaining proximity to walker throughout.  Patient did demonstrate improved efficacy with the sit to and from stand transfer with practice.  Patient's mother instructed to practice this with her at home in order to improve her transitions.  Worked on forward and retroambulation.  With retro ambulation patient able to make some progress at this but still requires max assist for management of walker as when she ambulates posteriorly she extends her arms and has difficulty with processing task of reading walker towards her.  Patient also shift weight posteriorly significantly requiring max assist to  prevent posterior loss of balance  Max cues required throughout for sustained attention of pt to task at hand.      HOME EXERCISE PROGRAM: Access Code: [facility] URL: [email or web address] Date: 07/28/2021 Prepared by: Thresa Ross  Exercises - Seated Long Arc Quad  - 1 x daily - 7 x weekly - 2 sets - 10 reps -Sit to stand- 1 x daily - 7 x weekly - 2 sets - 10 reps  Marching: 1 x daily - 7 x weekly - 2 sets - 10 reps   Access Code: [email or web address] URL: [email or web address] Date: 08/04/2021 Prepared by: Precious Bard  Exercises - Key Pinch with Putty  - 1 x daily - 7 x weekly - 2 sets - 10 reps - 5 hold - Hand Squeezes  - 1 x daily - 7 x weekly - 2 sets - 10 reps - 5 hold - Tip Pinch with Putty  - 1 x daily - 7 x weekly - 2 sets - 10 reps - 5 hold   PT Short Term Goals -  PT SHORT TERM GOAL #1   Title Patient will perform all transfers with Stand by assist using least restrictive AD,  BUE Support, and reaching back for safety to improve her functional independence and decrease fall risk.    Baseline 07/21/21: requires Min A and significant UE utilizaiton  9/14: SBA and UE use but completes safely.    Time 6    Period Weeks    Status Met, performing transfers to and from transport chair to armchair utilizing B upper extremity assist able to do it independently and safely with only standby assist from physical therapist             PT [place] #1   Title Patient/caregiver will report indepence with HEP with caregiver assistance.    Baseline 6/15: not copmelting HEP regularly/ father unsure of home program/ activities  9/14: been completing regularly    Time 8    Period Weeks    Status MET   Target Date 09/15/21      PT LONG TERM GOAL #2   Title Pt will decrease 5XSTS with UE use as needed by at least  15 seconds in order to demonstrate clinically significant improvement in LE strength.    Baseline 6/15:  61.54 sec with UE A 9/14: 25.01 sec, UE assist, LE trembling 12/7:1:11, difficulty with attention to task and potentially fatigue from walking endurance testing prior to this test. Hard to gauge fatigue secondary to pts communication difficulties. 1/5: 17 sec with UE on chair arms throughout.    Time 12    Period Weeks    Status ONGOING    Target Date 04/07/2022        PT LONG TERM GOAL #3   Title Pt will decrease TUG to below 34 seconds/decrease in order to demonstrate decreased fall risk.    Baseline 03/17/2021= 43.49 sec using RW 9/14: 40.6 sec  12/7: 32.8 sec   Time 12    Period Weeks    Status ONGOING    Target Date 04/07/2022        PT LONG TERM GOAL #4   Title Patient will demo ability to ambulate > 400 ft w/ LRAD versus hand held assist min assist to demonstrate improved household mobility and short community distances.    Baseline 03/17/2021= 125 feet with Min A and RW and cues for proximity to walker 9/14: 225 ft in 3:44 sec  12/7: 310 feet, motivation required, cues for proximity to walker throughout. Pt pushes walker to right consistently and has to make adjustments frequently to keep forward line of progression. Pt also has difficulty with kicking walker with R LE.    Time 12    Period Weeks    Status ONGOING    Target Date 04/07/2022        PT LONG TERM GOAL #5   Title Pt will improve FOTO to target score to display perceived improvements in ability to complete ADL's.    Baseline 6/15: no assessed secondary to father not knowing answers to questions, ptsent home with QR code for mother to complete survey at home.    Time 12    Period Weeks    Status Defer to next visit, QR code sent with father to give to mother for filling out. 9/14: pt mother performing at home between sessions 9/21: 47 ( 4 point increase) 12/8: Met   Target Date 04/07/22      PT  LONG TERM GOAL #6   Title Pt will increase by at least 0.15 m/s in order to demonstrate clinically significant improvement in  community ambulation.    Baseline 03/17/2021= 0.24 m/s using RW and min A from PT for walker management  9/14: .63m/s 12/7: .65m/s, assistance with maintining strainght line of progression.    Time 12    Period Weeks    Status ONGOING    Target Date 04/07/2022                 Plan -     Clinical Impression Statement Patient presents to physical therapy with her mother this date and for progress note.  Patient's mother reports there are days and patient seems to be making great progress and days when patient seems to not be making progress.  Patient and therapist discussed the patient tends to perform better in the morning compared to the afternoon times.  Patient does show progress with her 5 times sit to stand compared to last time this was assessed for patient regressed.  Patient does require upper extremity support throughout with 5 times to stand but this is mainly due for safety with full standing and patient has difficulty with sequencing task of holding onto chair which affects her time for this test.  Patient continue to make good progress with therapy at this time and will continue to benefit with skilled physical therapy to improve her safety with ambulation and to improve her overall function.Patient's condition has the potential to improve in response to therapy. Maximum improvement is yet to be obtained. The anticipated improvement is attainable and reasonable in a generally predictable time.       Personal Factors and Comorbidities Behavior Pattern;Transportation;Comorbidity 3+    Comorbidities HTN, CAD, HLD    Examination-Activity Limitations Bed Mobility;Dressing;Hygiene/Grooming;Caring for Others;Stairs;Stand;Toileting;Transfers;Sit;Bathing;Continence;Lift;Squat;Reach Overhead    Examination-Participation Restrictions Occupation;Medication Management;Cleaning;Yard Work;Personal Finances;Driving    Stability/Clinical Decision Making Evolving/Moderate complexity    Rehab  Potential Good    PT Frequency 1x / week    PT Duration 12 weeks    PT Treatment/Interventions ADLs/Self Care Home Management;Aquatic Therapy;Electrical Stimulation;DME Instruction;Gait training;Stair training;Functional mobility training;Therapeutic activities;Therapeutic exercise;Balance training;Neuromuscular re-education;Wheelchair mobility training;Patient/family education;Orthotic Fit/Training;Manual techniques;Passive range of motion;Cryotherapy;Moist Heat    PT Next Visit Plan Functional mobility training, cognitive remediation tasks   PT Home Exercise Plan Access Code: [email or web address]    Consulted and Agree with Plan of Care Patient;Family member/caregiver    Family Member Consulted Dad           1:21 PM, 02/13/22    Norman Herrlich, PT 02/13/2022, 1:21 PM

## 2022-02-23 ENCOUNTER — Ambulatory Visit: Payer: Medicaid Other | Admitting: Internal Medicine

## 2022-02-23 ENCOUNTER — Ambulatory Visit: Payer: Medicaid Other | Admitting: Physical Therapy

## 2022-02-23 DIAGNOSIS — R269 Unspecified abnormalities of gait and mobility: Secondary | ICD-10-CM

## 2022-02-23 DIAGNOSIS — R2681 Unsteadiness on feet: Secondary | ICD-10-CM

## 2022-02-23 DIAGNOSIS — R262 Difficulty in walking, not elsewhere classified: Secondary | ICD-10-CM | POA: Diagnosis not present

## 2022-02-23 DIAGNOSIS — R2689 Other abnormalities of gait and mobility: Secondary | ICD-10-CM

## 2022-02-23 DIAGNOSIS — M6281 Muscle weakness (generalized): Secondary | ICD-10-CM

## 2022-02-23 NOTE — Therapy (Signed)
OUTPATIENT PHYSICAL THERAPY TREATMENT NOTE       Patient Name: Beverly [name] MRN: [id number] DOB:09/19/1985, 37 y.o., female Today's Date: 02/23/2022  PCP: Ane Payment, [place] REFERRING PROVIDER: Meredith Staggers, MD   PT End of Session - 02/23/22 1304     Visit Number 21    Number of Visits 23    Date for PT Re-Evaluation 04/06/22    Authorization Type Medicaid approval- 36 visits for OT/PT/SLP combined    Authorization Time Period Auth: 9/14-12/6 12 visits    Authorization - Visit Number 2    Authorization - Number of Visits 3    Progress Note Due on Visit 20    PT Start Time [id number]    PT Stop Time 1345    PT Time Calculation (min) 40 min    Equipment Utilized During Treatment Gait belt    Activity Tolerance Patient tolerated treatment well;No increased pain    Behavior During Therapy Impulsive;Restless                       Past Medical History:  Diagnosis Date   Brain injury [facility])    CAD (coronary artery disease)    HLD (hyperlipidemia)    Hypertension    last pregnancy   Ischemic cardiomyopathy    STEMI (ST elevation myocardial infarction) ([place]) 09/2019   SCAD with aneurysmal dilation of proximal LAD.   Vaginal Pap smear, abnormal    when she was 37yo   Ventricular fibrillation ([place]) 09/23/2019   Past Surgical History:  Procedure Laterality Date   CARDIAC CATHETERIZATION     IR GASTROSTOMY TUBE MOD SED  10/07/2019   IR GASTROSTOMY TUBE REMOVAL  07/02/2020   IR [place] GASTRO/COLONIC TUBE PERCUT W/FLUORO  11/03/2021   LEFT HEART CATH AND CORONARY ANGIOGRAPHY N/A 09/23/2019   Procedure: LEFT HEART CATH AND CORONARY ANGIOGRAPHY;  Surgeon: Nelva Bush, MD;  Location: [place] CV LAB;  Service: Cardiovascular;  Laterality: N/A;   SUBQ ICD IMPLANT N/A 11/26/2020   Procedure: SUBQ ICD IMPLANT;  Surgeon: Vickie Epley, MD;  Location: [place] CV LAB;  Service: Cardiovascular;  Laterality: N/A;   Patient Active Problem List    Diagnosis Date Noted   Apraxia 10/05/2021   S/P ICD (internal cardiac defibrillator) procedure 11/27/2020   Hx of Cardiac arrest ([place]) 11/26/2020   Abnormality of gait 03/16/2020   Sleep disturbance 03/16/2020   Oropharyngeal dysphagia 02/12/2020   Coronary artery disease involving native coronary artery of native heart without angina pectoris 12/12/2019   Ischemic cardiomyopathy 12/12/2019   Brain injury ([place])    Prediabetes    Anoxic brain injury ([place]) 11/05/2019   Dysphagia 11/05/2019   Physical deconditioning 11/05/2019   Acute delirium 11/05/2019   Respiratory failure ([place])    Encounter for central line placement    Ventricular fibrillation ([place]) 09/23/2019   Acute combined systolic and diastolic heart failure ([place]) 09/23/2019   Ventricular tachycardia, polymorphic ([place]) 09/23/2019   Pelvic pain affecting pregnancy 10/15/2015   Supervision of normal pregnancy in third trimester 09/28/2015   Poor weight gain of pregnancy 09/28/2015   Iron deficiency anemia of pregnancy 09/01/2015   Increased BMI (body mass index) 07/29/2015    REFERRING DIAG: G93.1 (ICD-10-CM) - Anoxic brain injury ([place])  THERAPY DIAG:  Difficulty in walking, not elsewhere classified  Abnormality of gait and mobility  Unsteadiness on feet  Other abnormalities of gait and mobility  Muscle weakness (generalized)  Rationale for Evaluation and Treatment  Rehabilitation  PERTINENT HISTORY: Patient is a 37 year old with diagnois of anoxic brain injury. She was receiving outpatient PT until Feb. 2022 last year but ran out of visits. Patient has past medical history of Anoxic Brain Injury 09/2019, CAD, HLD, HTN, ISCHEMIC CARDIMYOPATHY, STEMI, V-FIB, ICD on 11/29/2020  .On 09/23/19 patient presented to ED 2 weeks postpartum (had emergent C-Section) with chest pain. In ED sustained VFib Cardiac Arrest requiring intubation. EKG showed ST elevation, Cath Lab showed aneurysmal dissection of the LAD was identified.  Course complicated by encephalopathy, anoxic brain injury, enterobacterial PNA. Patient is currently living with mom. Mother reports she is abel to walk some with a walker with supervision but primarily ambulates with HHA of mother who is her guardian. Mother also reports she requires assist for all transfers and ADL's.   PRECAUTIONS: Fall  SUBJECTIVE: Pt reports she is doing well and is excited for therapeutic interventions.   PAIN:  Are you having pain? No     TODAY'S TREATMENT:02/23/22 TA  Most interventions take increased time for cueing and attention to task   STS 2 x 5 repetitions to work on transition movements, pt requires frequent/ constant multimodal cueing for proper completion of this an all other activities secondary to attention deficits   // bar ambulation forward and retro walking x 5 laps x 3 sets with STS rest between sets.  -significant difficulty with maintaining proper UE movement and placement on // bars throughout every repetition, increased difficulty with maintenance of hand position with fatigue ( pt tends to move legs and leave arms in same position resulting in forward bend and has difficulty recovering after this)  STS ambulation across // bars, STS x 2 reps with rest between sets   Stance on airex pad x 1 min prolonged   Transfer to transport chair and pt brought to front waiting area to await father and under supervision of front office   Max cues required throughout for sustained attention of pt to task at hand.      HOME EXERCISE PROGRAM: Access Code: [facility] URL: [email or web address] Date: 07/28/2021 Prepared by: Rivka Barbara  Exercises - Seated Long Arc Quad  - 1 x daily - 7 x weekly - 2 sets - 10 reps -Sit to stand- 1 x daily - 7 x weekly - 2 sets - 10 reps  Marching: 1 x daily - 7 x weekly - 2 sets - 10 reps   Access Code: [email or web address] URL: [email or web address] Date: 08/04/2021 Prepared by: Janna Arch  Exercises - Key Pinch with Putty  - 1 x daily - 7 x weekly - 2 sets - 10 reps - 5 hold - Hand Squeezes  - 1 x daily - 7 x weekly - 2 sets - 10 reps - 5 hold - Tip Pinch with Putty  - 1 x daily - 7 x weekly - 2 sets - 10 reps - 5 hold   PT Short Term Goals -       PT SHORT TERM GOAL #1   Title Patient will perform all transfers with Stand by assist using least restrictive AD,  BUE Support, and reaching back for safety to improve her functional independence and decrease fall risk.    Baseline 07/21/21: requires Min A and significant UE utilizaiton  9/14: SBA and UE use but completes safely.    Time 6    Period Weeks    Status Met, performing transfers to and from transport chair to armchair utilizing B upper extremity  assist able to do it independently and safely with only standby assist from physical therapist             PT [place] #1   Title Patient/caregiver will report indepence with HEP with caregiver assistance.    Baseline 6/15: not copmelting HEP regularly/ father unsure of home program/ activities  9/14: been completing regularly    Time 8    Period Weeks    Status MET   Target Date 09/15/21      PT LONG TERM GOAL #2   Title Pt will decrease 5XSTS with UE use as needed by at least  15 seconds in order to demonstrate clinically significant improvement in LE strength.    Baseline 6/15: 61.54 sec with UE A 9/14: 25.01 sec, UE assist, LE trembling 12/7:1:11, difficulty with attention to task and potentially fatigue from walking endurance testing prior to this test. Hard to gauge fatigue secondary to pts communication difficulties. 1/5: 17 sec with UE on chair arms throughout.    Time 12    Period Weeks    Status ONGOING    Target Date 04/07/2022        PT LONG TERM GOAL #3   Title Pt will decrease TUG to below 34 seconds/decrease in order to demonstrate decreased fall risk.    Baseline 03/17/2021= 43.49 sec using RW 9/14: 40.6 sec   12/7: 32.8 sec   Time 12    Period Weeks    Status ONGOING    Target Date 04/07/2022        PT LONG TERM GOAL #4   Title Patient will demo ability to ambulate > 400 ft w/ LRAD versus hand held assist min assist to demonstrate improved household mobility and short community distances.    Baseline 03/17/2021= 125 feet with Min A and RW and cues for proximity to walker 9/14: 225 ft in 3:44 sec  12/7: 310 feet, motivation required, cues for proximity to walker throughout. Pt pushes walker to right consistently and has to make adjustments frequently to keep forward line of progression. Pt also has difficulty with kicking walker with R LE.    Time 12    Period Weeks    Status ONGOING    Target Date 04/07/2022        PT LONG TERM GOAL #5   Title Pt will improve FOTO to target score to display perceived improvements in ability to complete ADL's.    Baseline 6/15: no assessed secondary to father not knowing answers to questions, ptsent home with QR code for mother to complete survey at home.    Time 12    Period Weeks    Status Defer to next visit, QR code sent with father to give to mother for filling out. 9/14: pt mother performing at home between sessions 9/21: 47 ( 4 point increase) 12/8: Met   Target Date 04/07/22      PT LONG TERM GOAL #6   Title Pt will increase 10MWT by at least 0.15 m/s in order to demonstrate clinically significant improvement in community ambulation.    Baseline 03/17/2021= 0.24 m/s using RW and min A from PT for walker management  9/14: .22m/s 12/7: .81m/s, assistance with maintining strainght line of progression.    Time 12    Period Weeks    Status ONGOING    Target Date 04/07/2022  Plan -     Clinical Impression Statement  Pt presents with good motivation for completion of PT activities. All activities completed in // bars this date and activities focussed on training transfers and ambulation. Pt still has difficulty with retro gait and has  difficulty with UE management in coordination with Les. Pt did work well in // bars compared to standard support bar or walker for ambulation tasks and will recommend to use this for future sessions as it is available for use during treatment times. Pt will continue to benefit from skilled physical therapy intervention to address impairments, improve QOL, and attain therapy goals.      Personal Factors and Comorbidities Behavior Pattern;Transportation;Comorbidity 3+    Comorbidities HTN, CAD, HLD    Examination-Activity Limitations Bed Mobility;Dressing;Hygiene/Grooming;Caring for Others;Stairs;Stand;Toileting;Transfers;Sit;Bathing;Continence;Lift;Squat;Reach Overhead    Examination-Participation Restrictions Occupation;Medication Management;Cleaning;Yard Work;Personal Finances;Driving    Stability/Clinical Decision Making Evolving/Moderate complexity    Rehab Potential Good    PT Frequency 1x / week    PT Duration 12 weeks    PT Treatment/Interventions ADLs/Self Care Home Management;Aquatic Therapy;Electrical Stimulation;DME Instruction;Gait training;Stair training;Functional mobility training;Therapeutic activities;Therapeutic exercise;Balance training;Neuromuscular re-education;Wheelchair mobility training;Patient/family education;Orthotic Fit/Training;Manual techniques;Passive range of motion;Cryotherapy;Moist Heat    PT Next Visit Plan Functional mobility training, cognitive remediation tasks   PT Home Exercise Plan Access Code: [email or web address]    Consulted and Agree with Plan of Care Patient;Family member/caregiver    Family Member Consulted Dad           2:24 PM, 02/23/22    Norman Herrlich, PT 02/23/2022, 2:24 PM

## 2022-02-24 ENCOUNTER — Other Ambulatory Visit: Payer: Self-pay | Admitting: Physical Medicine & Rehabilitation

## 2022-02-24 DIAGNOSIS — R1312 Dysphagia, oropharyngeal phase: Secondary | ICD-10-CM

## 2022-02-24 DIAGNOSIS — R482 Apraxia: Secondary | ICD-10-CM

## 2022-02-24 DIAGNOSIS — G931 Anoxic brain damage, not elsewhere classified: Secondary | ICD-10-CM

## 2022-02-24 MED ORDER — METHYLPHENIDATE HCL 10 MG PO TABS: 15.0000 mg | ORAL_TABLET | Freq: Two times a day (BID) | ORAL | 0 refills | Status: DC

## 2022-03-02 ENCOUNTER — Ambulatory Visit: Payer: Medicaid Other | Admitting: Physical Therapy

## 2022-03-02 DIAGNOSIS — R269 Unspecified abnormalities of gait and mobility: Secondary | ICD-10-CM

## 2022-03-02 DIAGNOSIS — M6281 Muscle weakness (generalized): Secondary | ICD-10-CM

## 2022-03-02 DIAGNOSIS — R262 Difficulty in walking, not elsewhere classified: Secondary | ICD-10-CM | POA: Diagnosis not present

## 2022-03-02 DIAGNOSIS — R2689 Other abnormalities of gait and mobility: Secondary | ICD-10-CM

## 2022-03-02 DIAGNOSIS — R2681 Unsteadiness on feet: Secondary | ICD-10-CM

## 2022-03-02 NOTE — Therapy (Signed)
OUTPATIENT PHYSICAL THERAPY TREATMENT NOTE       Patient Name: Beverly [name] MRN: [id number] DOB:Jul 07, 1985, 37 y.o., female Today's Date: 03/02/2022  PCP: Ane Payment, [place] REFERRING PROVIDER: Meredith Staggers, MD   PT End of Session - 03/02/22 1335     Visit Number 22    Number of Visits 23    Date for PT Re-Evaluation 04/06/22    Authorization Type Medicaid approval- 58 visits for OT/PT/SLP combined    Authorization Time Period Auth: 9/14-12/6 12 visits    Authorization - Visit Number 3    Authorization - Number of Visits 3    Progress Note Due on Visit 20    PT Start Time 7829    PT Stop Time 1342    PT Time Calculation (min) 36 min    Equipment Utilized During Treatment Gait belt    Activity Tolerance Patient tolerated treatment well;No increased pain    Behavior During Therapy Impulsive;Restless                        Past Medical History:  Diagnosis Date   Brain injury [facility])    CAD (coronary artery disease)    HLD (hyperlipidemia)    Hypertension    last pregnancy   Ischemic cardiomyopathy    STEMI (ST elevation myocardial infarction) ([place]) 09/2019   SCAD with aneurysmal dilation of proximal LAD.   Vaginal Pap smear, abnormal    when she was 37yo   Ventricular fibrillation ([place]) 09/23/2019   Past Surgical History:  Procedure Laterality Date   CARDIAC CATHETERIZATION     IR GASTROSTOMY TUBE MOD SED  10/07/2019   IR GASTROSTOMY TUBE REMOVAL  07/02/2020   IR [place] GASTRO/COLONIC TUBE PERCUT W/FLUORO  11/03/2021   LEFT HEART CATH AND CORONARY ANGIOGRAPHY N/A 09/23/2019   Procedure: LEFT HEART CATH AND CORONARY ANGIOGRAPHY;  Surgeon: Nelva Bush, MD;  Location: [place] [facility];  Service: Cardiovascular;  Laterality: N/A;   SUBQ ICD IMPLANT N/A 11/26/2020   Procedure: SUBQ ICD IMPLANT;  Surgeon: Vickie Epley, MD;  Location: [place] [facility];  Service: Cardiovascular;  Laterality: N/A;   Patient Active Problem  List   Diagnosis Date Noted   Apraxia 10/05/2021   S/P ICD (internal cardiac defibrillator) procedure 11/27/2020   Hx of Cardiac arrest ([place]) 11/26/2020   Abnormality of gait 03/16/2020   Sleep disturbance 03/16/2020   Oropharyngeal dysphagia 02/12/2020   Coronary artery disease involving native coronary artery of native heart without angina pectoris 12/12/2019   Ischemic cardiomyopathy 12/12/2019   Brain injury ([place])    Prediabetes    Anoxic brain injury ([place]) 11/05/2019   Dysphagia 11/05/2019   Physical deconditioning 11/05/2019   Acute delirium 11/05/2019   Respiratory failure ([place])    Encounter for central line placement    Ventricular fibrillation ([place]) 09/23/2019   Acute combined systolic and diastolic heart failure ([place]) 09/23/2019   Ventricular tachycardia, polymorphic ([place]) 09/23/2019   Pelvic pain affecting pregnancy 10/15/2015   Supervision of normal pregnancy in third trimester 09/28/2015   Poor weight gain of pregnancy 09/28/2015   Iron deficiency anemia of pregnancy 09/01/2015   Increased BMI (body mass index) 07/29/2015    REFERRING DIAG: G93.1 (ICD-10-CM) - Anoxic brain injury ([place])  THERAPY DIAG:  Difficulty in walking, not elsewhere classified  Abnormality of gait and mobility  Unsteadiness on feet  Other abnormalities of gait and mobility  Muscle weakness (generalized)  Rationale for Evaluation and  Treatment Rehabilitation  PERTINENT HISTORY: Patient is a 37 year old with diagnois of anoxic brain injury. She was receiving outpatient PT until Feb. 2022 last year but ran out of visits. Patient has past medical history of Anoxic Brain Injury 09/2019, CAD, HLD, HTN, ISCHEMIC CARDIMYOPATHY, STEMI, V-FIB, ICD on 11/29/2020  .On 09/23/19 patient presented to ED 2 weeks postpartum (had emergent C-Section) with chest pain. In ED sustained VFib Cardiac Arrest requiring intubation. EKG showed ST elevation, Cath Lab showed aneurysmal dissection of the LAD was  identified. Course complicated by encephalopathy, anoxic brain injury, enterobacterial PNA. Patient is currently living with mom. Mother reports she is abel to walk some with a walker with supervision but primarily ambulates with HHA of mother who is her guardian. Mother also reports she requires assist for all transfers and ADL's.   PRECAUTIONS: Fall  SUBJECTIVE: Pt reports she is doing well. Her father reports no changes he is aware of since last session.   PAIN:  Are you having pain? No     TODAY'S TREATMENT:03/02/22 TA  All  interventions take increased time for cueing and attention to task   STS 2 x 5 repetitions to work on transition movements, pt requires frequent/ constant multimodal cueing for proper completion of this an all other activities secondary to attention deficits   Stance on airex pad without UE support x 3 reps x 30 seconds  -trembling in Les noted and frequent use of UE for support and reminders to release // bars to challenge balance.   // bar ambulation forward and retro walking x 3  laps x 3 sets with STS rest between sets.  -significant difficulty with maintaining proper UE movement and placement on // bars throughout every repetition, increased difficulty with maintenance of hand position with fatigue ( pt tends to move legs and leave arms in same position resulting in forward bend and has difficulty recovering after this), with retro gait pt requires mod manual assist to prevent turn around to sidestep or normal ambulation   STS ambulation across // bars, STS x 1 rep to transport chair    Max cues required throughout for sustained attention of pt to task at hand.  Pt has more distraction this date and seemingly increase in LE fatigue AEB LE trembling with gait and reluctance to participate in some activities.     HOME EXERCISE PROGRAM: Access Code: [facility] URL: [email or web address] Date: 07/28/2021 Prepared by: Thresa Ross  Exercises - Seated Long Arc Quad  - 1 x daily - 7 x weekly - 2 sets - 10 reps -Sit to stand- 1 x daily - 7 x weekly - 2 sets - 10 reps  Marching: 1 x daily - 7 x weekly - 2 sets - 10 reps   Access Code: [email or web address] URL: [email or web address] Date: 08/04/2021 Prepared by: Precious Bard  Exercises - Key Pinch with Putty  - 1 x daily - 7 x weekly - 2 sets - 10 reps - 5 hold - Hand Squeezes  - 1 x daily - 7 x weekly - 2 sets - 10 reps - 5 hold - Tip Pinch with Putty  - 1 x daily - 7 x weekly - 2 sets - 10 reps - 5 hold   PT Short Term Goals -       PT SHORT TERM GOAL #1   Title Patient will perform all transfers with Stand by assist using least restrictive AD,  BUE Support, and reaching back for safety to improve her  functional independence and decrease fall risk.    Baseline 07/21/21: requires Min A and significant UE utilizaiton  9/14: SBA and UE use but completes safely.    Time 6    Period Weeks    Status Met, performing transfers to and from transport chair to armchair utilizing B upper extremity assist able to do it independently and safely with only standby assist from physical therapist             PT Long Term Goals       PT LONG TERM GOAL #1   Title Patient/caregiver will report indepence with HEP with caregiver assistance.    Baseline 6/15: not copmelting HEP regularly/ father unsure of home program/ activities  9/14: been completing regularly    Time 8    Period Weeks    Status MET   Target Date 09/15/21      PT LONG TERM GOAL #2   Title Pt will decrease 5XSTS with UE use as needed by at least  15 seconds in order to demonstrate clinically significant improvement in LE strength.    Baseline 6/15: 61.54 sec with UE A 9/14: 25.01 sec, UE assist, LE trembling 12/7:1:11, difficulty with attention to task and potentially fatigue from walking endurance testing prior to this test. Hard to gauge fatigue secondary to pts communication difficulties. 1/5: 17  sec with UE on chair arms throughout.    Time 12    Period Weeks    Status ONGOING    Target Date 04/07/2022        PT LONG TERM GOAL #3   Title Pt will decrease TUG to below 34 seconds/decrease in order to demonstrate decreased fall risk.    Baseline 03/17/2021= 43.49 sec using RW 9/14: 40.6 sec  12/7: 32.8 sec   Time 12    Period Weeks    Status ONGOING    Target Date 04/07/2022        PT LONG TERM GOAL #4   Title Patient will demo ability to ambulate > 400 ft w/ LRAD versus hand held assist min assist to demonstrate improved household mobility and short community distances.    Baseline 03/17/2021= 125 feet with Min A and RW and cues for proximity to walker 9/14: 225 ft in 3:44 sec  12/7: 310 feet, motivation required, cues for proximity to walker throughout. Pt pushes walker to right consistently and has to make adjustments frequently to keep forward line of progression. Pt also has difficulty with kicking walker with R LE.    Time 12    Period Weeks    Status ONGOING    Target Date 04/07/2022        PT LONG TERM GOAL #5   Title Pt will improve FOTO to target score to display perceived improvements in ability to complete ADL's.    Baseline 6/15: no assessed secondary to father not knowing answers to questions, ptsent home with QR code for mother to complete survey at home.    Time 12    Period Weeks    Status Defer to next visit, QR code sent with father to give to mother for filling out. 9/14: pt mother performing at home between sessions 9/21: 47 ( 4 point increase) 12/8: Met   Target Date 04/07/22      PT LONG TERM GOAL #6   Title Pt will increase by at least 0.15 m/s in order to demonstrate clinically significant improvement in community ambulation.    Baseline 03/17/2021= 0.24  m/s using RW and min A from PT for walker management  9/14: .64m/s 12/7: .40m/s, assistance with maintining strainght line of progression.    Time 12    Period Weeks    Status ONGOING    Target Date  04/07/2022                 Plan -     Clinical Impression Statement  Pt presents with good motivation for completion of PT activities. All activities completed in // bars this date and activities focussed on training transfers and ambulation. Pt still has difficulty with retro gait and has difficulty with UE management in coordination with Les. Pt did work well in // bars compared to standard support bar or walker for ambulation tasks and will recommend to use this for future sessions as it is available for use during treatment times. Pt shows increased tendency to fatigue this date compared to previous sessions.  Pt will continue to benefit from skilled physical therapy intervention to address impairments, improve QOL, and attain therapy goals.      Personal Factors and Comorbidities Behavior Pattern;Transportation;Comorbidity 3+    Comorbidities HTN, CAD, HLD    Examination-Activity Limitations Bed Mobility;Dressing;Hygiene/Grooming;Caring for Others;Stairs;Stand;Toileting;Transfers;Sit;Bathing;Continence;Lift;Squat;Reach Overhead    Examination-Participation Restrictions Occupation;Medication Management;Cleaning;Yard Work;Personal Finances;Driving    Stability/Clinical Decision Making Evolving/Moderate complexity    Rehab Potential Good    PT Frequency 1x / week    PT Duration 12 weeks    PT Treatment/Interventions ADLs/Self Care Home Management;Aquatic Therapy;Electrical Stimulation;DME Instruction;Gait training;Stair training;Functional mobility training;Therapeutic activities;Therapeutic exercise;Balance training;Neuromuscular re-education;Wheelchair mobility training;Patient/family education;Orthotic Fit/Training;Manual techniques;Passive range of motion;Cryotherapy;Moist Heat    PT Next Visit Plan Functional mobility training, cognitive remediation tasks   PT Home Exercise Plan Access Code: [email or web address]    Consulted and Agree with Plan of Care Patient;Family member/caregiver    Family  Member Consulted Dad           3:19 PM, 03/02/22    Particia Lather, PT 03/02/2022, 3:19 PM

## 2022-03-08 ENCOUNTER — Ambulatory Visit: Payer: Medicaid Other | Admitting: Physical Therapy

## 2022-03-15 ENCOUNTER — Ambulatory Visit: Payer: 59 | Attending: Physical Medicine & Rehabilitation | Admitting: Physical Therapy

## 2022-03-15 ENCOUNTER — Encounter: Payer: Self-pay | Admitting: Physical Therapy

## 2022-03-15 DIAGNOSIS — R269 Unspecified abnormalities of gait and mobility: Secondary | ICD-10-CM | POA: Insufficient documentation

## 2022-03-15 DIAGNOSIS — M6281 Muscle weakness (generalized): Secondary | ICD-10-CM | POA: Insufficient documentation

## 2022-03-15 DIAGNOSIS — R2689 Other abnormalities of gait and mobility: Secondary | ICD-10-CM | POA: Diagnosis present

## 2022-03-15 DIAGNOSIS — R2681 Unsteadiness on feet: Secondary | ICD-10-CM | POA: Insufficient documentation

## 2022-03-15 DIAGNOSIS — R262 Difficulty in walking, not elsewhere classified: Secondary | ICD-10-CM | POA: Diagnosis present

## 2022-03-15 NOTE — Therapy (Signed)
OUTPATIENT PHYSICAL THERAPY TREATMENT NOTE       Patient Name: Beverly [name] MRN: [id number] DOB:[birth year], 37 y.o., female Today's Date: 03/15/2022  PCP: Ane Payment, [place] REFERRING PROVIDER: Meredith Staggers, MD   PT End of Session - 03/15/22 1620     Visit Number 23    Number of Visits 25    Date for PT Re-Evaluation 04/06/22    Authorization Type Medicaid approval- 11 visits for OT/PT/SLP combined    Authorization Time Period 2/7-4/30/24 12 visits    Authorization - Visit Number 1    Authorization - Number of Visits 12    Progress Note Due on Visit 30    PT Start Time 6295    PT Stop Time 1643    PT Time Calculation (min) 38 min    Equipment Utilized During Treatment Gait belt    Activity Tolerance Patient tolerated treatment well;No increased pain    Behavior During Therapy Impulsive;Restless                         Past Medical History:  Diagnosis Date   Brain injury [facility])    CAD (coronary artery disease)    HLD (hyperlipidemia)    Hypertension    last pregnancy   Ischemic cardiomyopathy    STEMI (ST elevation myocardial infarction) ([place]) 09/2019   SCAD with aneurysmal dilation of proximal LAD.   Vaginal Pap smear, abnormal    when she was 37yo   Ventricular fibrillation ([place]) 09/23/2019   Past Surgical History:  Procedure Laterality Date   CARDIAC CATHETERIZATION     IR GASTROSTOMY TUBE MOD SED  10/07/2019   IR GASTROSTOMY TUBE REMOVAL  07/02/2020   IR [place] GASTRO/COLONIC TUBE PERCUT W/FLUORO  11/03/2021   LEFT HEART CATH AND CORONARY ANGIOGRAPHY N/A 09/23/2019   Procedure: LEFT HEART CATH AND CORONARY ANGIOGRAPHY;  Surgeon: Nelva Bush, MD;  Location: [place] [facility];  Service: Cardiovascular;  Laterality: N/A;   SUBQ ICD IMPLANT N/A 11/26/2020   Procedure: SUBQ ICD IMPLANT;  Surgeon: Vickie Epley, MD;  Location: [place] [facility];  Service: Cardiovascular;  Laterality: N/A;   Patient Active Problem List    Diagnosis Date Noted   Apraxia 10/05/2021   S/P ICD (internal cardiac defibrillator) procedure 11/27/2020   Hx of Cardiac arrest ([place]) 11/26/2020   Abnormality of gait 03/16/2020   Sleep disturbance 03/16/2020   Oropharyngeal dysphagia 02/12/2020   Coronary artery disease involving native coronary artery of native heart without angina pectoris 12/12/2019   Ischemic cardiomyopathy 12/12/2019   Brain injury ([place])    Prediabetes    Anoxic brain injury ([place]) 11/05/2019   Dysphagia 11/05/2019   Physical deconditioning 11/05/2019   Acute delirium 11/05/2019   Respiratory failure ([place])    Encounter for central line placement    Ventricular fibrillation ([place]) 09/23/2019   Acute combined systolic and diastolic heart failure ([place]) 09/23/2019   Ventricular tachycardia, polymorphic ([place]) 09/23/2019   Pelvic pain affecting pregnancy 10/15/2015   Supervision of normal pregnancy in third trimester 09/28/2015   Poor weight gain of pregnancy 09/28/2015   Iron deficiency anemia of pregnancy 09/01/2015   Increased BMI (body mass index) 07/29/2015    REFERRING DIAG: G93.1 (ICD-10-CM) - Anoxic brain injury ([place])  THERAPY DIAG:  Difficulty in walking, not elsewhere classified  Abnormality of gait and mobility  Unsteadiness on feet  Other abnormalities of gait and mobility  Muscle weakness (generalized)  Rationale for Evaluation and  Treatment Rehabilitation  PERTINENT HISTORY: Patient is a 37 year old with diagnois of anoxic brain injury. She was receiving outpatient PT until Feb. 2022 last year but ran out of visits. Patient has past medical history of Anoxic Brain Injury 09/2019, CAD, HLD, HTN, ISCHEMIC CARDIMYOPATHY, STEMI, V-FIB, ICD on 11/29/2020  .On 09/23/19 patient presented to ED 2 weeks postpartum (had emergent C-Section) with chest pain. In ED sustained VFib Cardiac Arrest requiring intubation. EKG showed ST elevation, Cath Lab showed aneurysmal dissection of the LAD was identified.  Course complicated by encephalopathy, anoxic brain injury, enterobacterial PNA. Patient is currently living with mom. Mother reports she is abel to walk some with a walker with supervision but primarily ambulates with HHA of mother who is her guardian. Mother also reports she requires assist for all transfers and ADL's.   PRECAUTIONS: Fall  SUBJECTIVE: Pt reports she is doing well. Her father reports no changes he is aware of since last session.   PAIN:  Are you having pain? No     TODAY'S TREATMENT:03/15/22 TA  All  interventions take increased time for cueing and attention to task   // bar ambulation forward and retro walking x 3  laps x 2 sets with STS rest between sets.  -Improved efficacy with UE placement throughout with HH assist from PT  Stance on incline board 2 x 30 seconds, difficulty with instructing pt to get on to produce desired effect, pt very reluctant to bear weigh through heels  STS 2 x 5 repetitions to work on transition movements, pt requires frequent/ constant multimodal cueing for proper completion of this an all other activities secondary to attention deficits   Stance on airex pad without UE support x 3 reps x 30 seconds  -improved stability this session compared to last, constant cues for gaze and hip orientation but able to hold position without UE assisst for 20-30 second intervals.    Ambulation over 2 steps in // bars one 4 in and one 6 inch, x 2 times through. Fatigue noted on second lap and pt reports she is tired when asked by PT  STS ambulation across // bars to transport  then stand to sit transfer with set up assistance and cues from PT   Max cues required throughout for sustained attention of pt to task at hand.  Pt has more distraction this date and seemingly increase in LE fatigue AEB LE trembling with gait and reluctance to participate in some activities.     HOME EXERCISE PROGRAM: Access Code: [facility] URL:  [email or web address] Date: 07/28/2021 Prepared by: Rivka Barbara  Exercises - Seated Long Arc Quad  - 1 x daily - 7 x weekly - 2 sets - 10 reps -Sit to stand- 1 x daily - 7 x weekly - 2 sets - 10 reps  Marching: 1 x daily - 7 x weekly - 2 sets - 10 reps   Access Code: [email or web address] URL: [email or web address] Date: 08/04/2021 Prepared by: Janna Arch  Exercises - Key Pinch with Putty  - 1 x daily - 7 x weekly - 2 sets - 10 reps - 5 hold - Hand Squeezes  - 1 x daily - 7 x weekly - 2 sets - 10 reps - 5 hold - Tip Pinch with Putty  - 1 x daily - 7 x weekly - 2 sets - 10 reps - 5 hold   PT Short Term Goals -       PT SHORT TERM GOAL #1   Title  Patient will perform all transfers with Stand by assist using least restrictive AD,  BUE Support, and reaching back for safety to improve her functional independence and decrease fall risk.    Baseline 07/21/21: requires Min A and significant UE utilizaiton  9/14: SBA and UE use but completes safely.    Time 6    Period Weeks    Status Met, performing transfers to and from transport chair to armchair utilizing B upper extremity assist able to do it independently and safely with only standby assist from physical therapist             PT [place] #1   Title Patient/caregiver will report indepence with HEP with caregiver assistance.    Baseline 6/15: not copmelting HEP regularly/ father unsure of home program/ activities  9/14: been completing regularly    Time 8    Period Weeks    Status MET   Target Date 09/15/21      PT LONG TERM GOAL #2   Title Pt will decrease 5XSTS with UE use as needed by at least  15 seconds in order to demonstrate clinically significant improvement in LE strength.    Baseline 6/15: 61.54 sec with UE A 9/14: 25.01 sec, UE assist, LE trembling 12/7:1:11, difficulty with attention to task and potentially fatigue from walking endurance testing prior to this  test. Hard to gauge fatigue secondary to pts communication difficulties. 1/5: 17 sec with UE on chair arms throughout.    Time 12    Period Weeks    Status ONGOING    Target Date 04/07/2022        PT LONG TERM GOAL #3   Title Pt will decrease TUG to below 34 seconds/decrease in order to demonstrate decreased fall risk.    Baseline 03/17/2021= 43.49 sec using RW 9/14: 40.6 sec  12/7: 32.8 sec   Time 12    Period Weeks    Status ONGOING    Target Date 04/07/2022        PT LONG TERM GOAL #4   Title Patient will demo ability to ambulate > 400 ft w/ LRAD versus hand held assist min assist to demonstrate improved household mobility and short community distances.    Baseline 03/17/2021= 125 feet with Min A and RW and cues for proximity to walker 9/14: 225 ft in 3:44 sec  12/7: 310 feet, motivation required, cues for proximity to walker throughout. Pt pushes walker to right consistently and has to make adjustments frequently to keep forward line of progression. Pt also has difficulty with kicking walker with R LE.    Time 12    Period Weeks    Status ONGOING    Target Date 04/07/2022        PT LONG TERM GOAL #5   Title Pt will improve FOTO to target score to display perceived improvements in ability to complete ADL's.    Baseline 6/15: no assessed secondary to father not knowing answers to questions, ptsent home with QR code for mother to complete survey at home.    Time 12    Period Weeks    Status Defer to next visit, QR code sent with father to give to mother for filling out. 9/14: pt mother performing at home between sessions 9/21: 47 ( 4 point increase) 12/8: Met   Target Date 04/07/22      PT LONG TERM GOAL #6   Title Pt  will increase by at least 0.15 m/s in order to demonstrate clinically significant improvement in community ambulation.    Baseline 03/17/2021= 0.24 m/s using RW and min A from PT for walker management  9/14: .61m/s 12/7: .43m/s, assistance with maintining strainght line  of progression.    Time 12    Period Weeks    Status ONGOING    Target Date 04/07/2022                 Plan -     Clinical Impression Statement  Pt presents with good motivation for completion of PT activities. All activities completed in // bars this date and activities focussed on training transfers and ambulation. Pt still has difficulty with retro gait and has difficulty with UE management in coordination with Les, however, this does improve with HH assist from PT to assist with UE management. Pt very reluctant to bear weight through heels resulting in forward lean with retro gait. Attempted interventions to address this with mixed success. Pt did work well in // bars compared to standard support bar or walker for ambulation tasks and will recommend to use this for future sessions as it is available for use during treatment times. Pt shows increased tendency to fatigue this date compared to previous sessions.  Pt will continue to benefit from skilled physical therapy intervention to address impairments, improve QOL, and attain therapy goals.      Personal Factors and Comorbidities Behavior Pattern;Transportation;Comorbidity 3+    Comorbidities HTN, CAD, HLD    Examination-Activity Limitations Bed Mobility;Dressing;Hygiene/Grooming;Caring for Others;Stairs;Stand;Toileting;Transfers;Sit;Bathing;Continence;Lift;Squat;Reach Overhead    Examination-Participation Restrictions Occupation;Medication Management;Cleaning;Yard Work;Personal Finances;Driving    Stability/Clinical Decision Making Evolving/Moderate complexity    Rehab Potential Good    PT Frequency 1x / week    PT Duration 12 weeks    PT Treatment/Interventions ADLs/Self Care Home Management;Aquatic Therapy;Electrical Stimulation;DME Instruction;Gait training;Stair training;Functional mobility training;Therapeutic activities;Therapeutic exercise;Balance training;Neuromuscular re-education;Wheelchair mobility training;Patient/family  education;Orthotic Fit/Training;Manual techniques;Passive range of motion;Cryotherapy;Moist Heat    PT Next Visit Plan Functional mobility training, cognitive remediation tasks   PT Home Exercise Plan Access Code: [email or web address]    Consulted and Agree with Plan of Care Patient;Family member/caregiver    Family Member Consulted Dad           5:12 PM, 03/15/22    Norman Herrlich, PT 03/15/2022, 5:12 PM

## 2022-03-16 ENCOUNTER — Ambulatory Visit (INDEPENDENT_AMBULATORY_CARE_PROVIDER_SITE_OTHER): Payer: 59

## 2022-03-16 DIAGNOSIS — I255 Ischemic cardiomyopathy: Secondary | ICD-10-CM | POA: Diagnosis not present

## 2022-03-16 LAB — CUP PACEART REMOTE DEVICE CHECK
Battery Remaining Percentage: 81 %
Date Time Interrogation Session: 20240208071200
Implantable Pulse Generator Implant Date: 20221021
Pulse Gen Serial Number: 161805

## 2022-03-22 ENCOUNTER — Encounter: Payer: Self-pay | Admitting: Physical Medicine & Rehabilitation

## 2022-03-22 ENCOUNTER — Encounter: Payer: 59 | Attending: Physical Medicine & Rehabilitation | Admitting: Physical Medicine & Rehabilitation

## 2022-03-22 VITALS — BP 101/67 | HR 80 | Ht 69.0 in

## 2022-03-22 DIAGNOSIS — R482 Apraxia: Secondary | ICD-10-CM | POA: Insufficient documentation

## 2022-03-22 DIAGNOSIS — G931 Anoxic brain damage, not elsewhere classified: Secondary | ICD-10-CM | POA: Insufficient documentation

## 2022-03-22 DIAGNOSIS — R1312 Dysphagia, oropharyngeal phase: Secondary | ICD-10-CM | POA: Diagnosis present

## 2022-03-22 MED ORDER — METHYLPHENIDATE HCL 10 MG PO TABS: 15.0000 mg | ORAL_TABLET | Freq: Two times a day (BID) | ORAL | 0 refills | Status: DC

## 2022-03-22 NOTE — Patient Instructions (Signed)
ALWAYS FEEL FREE TO CALL OUR OFFICE WITH ANY PROBLEMS OR QUESTIONS [id number])  **PLEASE NOTE** ALL MEDICATION REFILL REQUESTS (INCLUDING CONTROLLED SUBSTANCES) NEED TO BE MADE AT LEAST 7 DAYS PRIOR TO REFILL BEING DUE. ANY REFILL REQUESTS INSIDE THAT TIME FRAME MAY RESULT IN DELAYS IN RECEIVING YOUR PRESCRIPTION.                   TRY USING PHONE, MUSIC, VIDEOS TO SUPPLEMENT AND STIMULATE SPEECH.    WOULD HOLD OFF ON ANY E-STIM.

## 2022-03-22 NOTE — Progress Notes (Signed)
Subjective:    Patient ID: Beverly [name], female    DOB: [birth year], 37 y.o.   MRN: [id number]  HPI  Beverly [name] is here in follow up of her anoxic BI. She has demonstrated some improved initiation with language and swallowing at times with her current meds. It is still inconsistent.  Speech therapy at [facility] discussed trying an MBS and NMES and wanted my opinion in that regard. Beverly [name] at times has been able to eat entire meals and has been able to drink thin liquids.  The problem is that the vast majority of the time she pockets food and liquid and is unable to swallow it.  Mom has noticed her being more engaging on the telephone with people.  She generally still tends to be nonverbal and pocket oral secretions.  SHe is on sinemet and ritalin. No cardiac complications, no defibrillator shocks, etc. she has cardiac follow-up scheduled next week         Pain Inventory Average Pain 0 Pain Right Now 0 My pain is  no pain  LOCATION OF PAIN  no pain  BOWEL Number of stools per week: 7   BLADDER Pads  Bladder incontinence Yes     Mobility walk with assistance use a walker ability to climb steps?  yes do you drive?  no use a wheelchair needs help with transfers Do you have any goals in this area?  yes  Function disabled: date disabled 2021 I need assistance with the following:  feeding, dressing, bathing, toileting, meal prep, household duties, and shopping Do you have any goals in this area?  yes  Neuro/Psych tremor trouble walking spasms confusion  Prior Studies Any changes since last visit?  no  Physicians involved in your care Any changes since last visit?  no   Family History  Problem Relation Age of Onset   Hyperlipidemia Mother    Hypertension Mother    Diabetes Maternal Grandmother    Seizures Maternal Grandmother    Thyroid disease Maternal Grandmother    Diabetes Paternal Grandmother    Rashes / Skin problems Son        ezcema   Seizures Son     Social History   Socioeconomic History   Marital status: Single    Spouse name: Not on file   Number of children: 3   Years of education: Not on file   Highest education level: Not on file  Occupational History   Occupation: oncology    Employer: [employer]    Comment: NA  Tobacco Use   Smoking status: Never   Smokeless tobacco: Never  Vaping Use   Vaping Use: Never used  Substance and Sexual Activity   Alcohol use: Not Currently    Alcohol/week: 0.0 standard drinks of alcohol    Comment: quit 2017   Drug use: No    Frequency: 5.0 times per week    Types: Marijuana    Comment: stopped when she found out she was pregnant   Sexual activity: Not Currently    Partners: Male    Birth control/protection: None  Other Topics Concern   Not on file  Social History Narrative   01/06/20 lives with mom and her children   Social Determinants of Health   Financial Resource Strain: Not on file  Food Insecurity: Not on file  Transportation Needs: Not on file  Physical Activity: Not on file  Stress: Not on file  Social Connections: Not on file   Past Surgical History:  Procedure Laterality Date   CARDIAC CATHETERIZATION     IR GASTROSTOMY TUBE MOD SED  10/07/2019   IR GASTROSTOMY TUBE REMOVAL  07/02/2020   IR [place] GASTRO/COLONIC TUBE PERCUT W/FLUORO  11/03/2021   LEFT HEART CATH AND CORONARY ANGIOGRAPHY N/A 09/23/2019   Procedure: LEFT HEART CATH AND CORONARY ANGIOGRAPHY;  Surgeon: Nelva Bush, MD;  Location: [place] [facility];  Service: Cardiovascular;  Laterality: N/A;   SUBQ ICD IMPLANT N/A 11/26/2020   Procedure: SUBQ ICD IMPLANT;  Surgeon: Vickie Epley, MD;  Location: [place] [facility];  Service: Cardiovascular;  Laterality: N/A;   Past Medical History:  Diagnosis Date   Brain injury ([place])    CAD (coronary artery disease)    HLD (hyperlipidemia)    Hypertension    last pregnancy   Ischemic cardiomyopathy    STEMI (ST elevation myocardial infarction)  ([place]) 09/2019   SCAD with aneurysmal dilation of proximal LAD.   Vaginal Pap smear, abnormal    when she was 37yo   Ventricular fibrillation ([place]) 09/23/2019   BP 101/67   Pulse 80   Ht 5' 9"$  (1.753 m)   BMI 23.94 kg/m   Opioid Risk Score:   Fall Risk Score:  `1  Depression screen [facility] 2/9     03/22/2022    2:07 PM 10/05/2021    3:35 PM 08/03/2021    3:15 PM 06/01/2021    3:14 PM 03/23/2021    3:03 PM 02/02/2021    2:22 PM 09/08/2020    3:45 PM  Depression screen PHQ 2/9  Decreased Interest 1 0 0 0 0 0 0  Down, Depressed, Hopeless 1 0 0 0 0 0 0  PHQ - 2 Score 2 0 0 0 0 0 0    Review of Systems  Musculoskeletal:  Positive for gait problem.  Neurological:  Positive for tremors.       Spasms  Psychiatric/Behavioral:  Positive for confusion.   All other systems reviewed and are negative.      Objective:   Physical Exam  General: No acute distress HEENT: NCAT, EOMI, oral membranes moist Cards: reg rate  Chest: normal effort Abdomen: Soft, NT, ND Skin: dry, intact Extremities: no edema Psych: pleasant and appropriate    Skin: Warm and dry.  Intact.    Musc: No edema in extremities.  No tenderness in extremities. Neuro: Definitely more alert. More engaging. Answers questions. Still very dysarthric. Seems close to answering questions but gets "stuck."  She did verbalize for me on a few occasions with some cajoling and then 1 time spontaneously as she left today.  Some of her apraxia results of being internally distracted as well.  She still tends to puffs her cheeks. Moves all 4's.    Assessment & Plan:  1. Anoxic BI - substantial attention, initiation deficits as well as memory deficits              MRI/CTs unremarkable             -Recent ICD placement.           -Ritalin  -continue at 15 mg bid. -Heart rate and blood pressure remain under good control -continue sinemet to 25/100  tid.  -Discussed using other calories for speech including the telephone as well as  face time more Internet video.  Something like karaoke also may be helpful or using music and singing with her as well. 2. Dysphagia:             -  ongoing oral motor apraxia and coordination. Seems to be closer to initiating.              Cont PEG as a back up for now             -[facility] SLP for cognition and swallowing finishing up but will resume in January. -NO E-STIM, MBS ok, but I'm not sure what new will be learned                3. Gait abnormality             Cont ambulation with assistance               RW for balance              3. Sleep disturbance: improved             Melatonin 3 mg daily --can take with trazodone            -trazodone at night time scheduled.  this has helped her sleep/wake quite a bit              -More alert during the day per mom   31mnutes of face to face patient care time were spent during this visit. All questions were encouraged and answered.  Follow up with me in 4 mos .

## 2022-03-23 ENCOUNTER — Ambulatory Visit: Payer: 59 | Admitting: Physical Therapy

## 2022-03-23 DIAGNOSIS — M6281 Muscle weakness (generalized): Secondary | ICD-10-CM

## 2022-03-23 DIAGNOSIS — R262 Difficulty in walking, not elsewhere classified: Secondary | ICD-10-CM | POA: Diagnosis not present

## 2022-03-23 DIAGNOSIS — R2681 Unsteadiness on feet: Secondary | ICD-10-CM

## 2022-03-23 DIAGNOSIS — R2689 Other abnormalities of gait and mobility: Secondary | ICD-10-CM

## 2022-03-23 DIAGNOSIS — R269 Unspecified abnormalities of gait and mobility: Secondary | ICD-10-CM

## 2022-03-23 NOTE — Therapy (Signed)
OUTPATIENT PHYSICAL THERAPY TREATMENT NOTE       Patient Name: Beverly [name] MRN: [id number] DOB:01-15-1986, 37 y.o., female Today's Date: 03/24/2022  PCP: Ane Payment, [place] REFERRING PROVIDER: Meredith Staggers, MD   PT End of Session - 03/23/22 1436     Visit Number 24    Number of Visits 25    Date for PT Re-Evaluation 04/06/22    Authorization Type Medicaid approval- 57 visits for OT/PT/SLP combined    Authorization Time Period 2/7-4/30/24 12 visits    Authorization - Visit Number 2    Authorization - Number of Visits 12    Progress Note Due on Visit 30    PT Start Time [id number]    PT Stop Time 1515    PT Time Calculation (min) 43 min    Equipment Utilized During Treatment Gait belt    Activity Tolerance Patient tolerated treatment well;No increased pain    Behavior During Therapy Impulsive;Restless                          Past Medical History:  Diagnosis Date   Brain injury [facility])    CAD (coronary artery disease)    HLD (hyperlipidemia)    Hypertension    last pregnancy   Ischemic cardiomyopathy    STEMI (ST elevation myocardial infarction) ([place]) 09/2019   SCAD with aneurysmal dilation of proximal LAD.   Vaginal Pap smear, abnormal    when she was 37yo   Ventricular fibrillation ([place]) 09/23/2019   Past Surgical History:  Procedure Laterality Date   CARDIAC CATHETERIZATION     IR GASTROSTOMY TUBE MOD SED  10/07/2019   IR GASTROSTOMY TUBE REMOVAL  07/02/2020   IR [place] GASTRO/COLONIC TUBE PERCUT W/FLUORO  11/03/2021   LEFT HEART CATH AND CORONARY ANGIOGRAPHY N/A 09/23/2019   Procedure: LEFT HEART CATH AND CORONARY ANGIOGRAPHY;  Surgeon: Nelva Bush, MD;  Location: [place] [facility];  Service: Cardiovascular;  Laterality: N/A;   SUBQ ICD IMPLANT N/A 11/26/2020   Procedure: SUBQ ICD IMPLANT;  Surgeon: Vickie Epley, MD;  Location: [place] [facility];  Service: Cardiovascular;  Laterality: N/A;   Patient Active Problem  List   Diagnosis Date Noted   Apraxia 10/05/2021   S/P ICD (internal cardiac defibrillator) procedure 11/27/2020   Hx of Cardiac arrest ([place]) 11/26/2020   Abnormality of gait 03/16/2020   Sleep disturbance 03/16/2020   Oropharyngeal dysphagia 02/12/2020   Coronary artery disease involving native coronary artery of native heart without angina pectoris 12/12/2019   Ischemic cardiomyopathy 12/12/2019   Brain injury ([place])    Prediabetes    Anoxic brain injury ([place]) 11/05/2019   Dysphagia 11/05/2019   Physical deconditioning 11/05/2019   Acute delirium 11/05/2019   Respiratory failure ([place])    Encounter for central line placement    Ventricular fibrillation ([place]) 09/23/2019   Acute combined systolic and diastolic heart failure ([place]) 09/23/2019   Ventricular tachycardia, polymorphic ([place]) 09/23/2019   Pelvic pain affecting pregnancy 10/15/2015   Supervision of normal pregnancy in third trimester 09/28/2015   Poor weight gain of pregnancy 09/28/2015   Iron deficiency anemia of pregnancy 09/01/2015   Increased BMI (body mass index) 07/29/2015    REFERRING DIAG: G93.1 (ICD-10-CM) - Anoxic brain injury ([place])  THERAPY DIAG:  Difficulty in walking, not elsewhere classified  Abnormality of gait and mobility  Unsteadiness on feet  Other abnormalities of gait and mobility  Muscle weakness (generalized)  Rationale for Evaluation  and Treatment Rehabilitation  PERTINENT HISTORY: Patient is a 37 year old with diagnois of anoxic brain injury. She was receiving outpatient PT until Feb. 2022 last year but ran out of visits. Patient has past medical history of Anoxic Brain Injury 09/2019, CAD, HLD, HTN, ISCHEMIC CARDIMYOPATHY, STEMI, V-FIB, ICD on 11/29/2020  .On 09/23/19 patient presented to ED 2 weeks postpartum (had emergent C-Section) with chest pain. In ED sustained VFib Cardiac Arrest requiring intubation. EKG showed ST elevation, Cath Lab showed aneurysmal dissection of the LAD was  identified. Course complicated by encephalopathy, anoxic brain injury, enterobacterial PNA. Patient is currently living with mom. Mother reports she is abel to walk some with a walker with supervision but primarily ambulates with HHA of mother who is her guardian. Mother also reports she requires assist for all transfers and ADL's.   PRECAUTIONS: Fall  SUBJECTIVE: Pt reports she is doing well. Her father reports no changes he is aware of since last session.   PAIN:  Are you having pain? No     TODAY'S TREATMENT:03/24/22 TA  All  interventions take increased time for cueing and attention to task   // bar ambulation forward and retro walking x 4  laps , external cues to prevent flexed trunk posture throughout.  10 x squats with UE assist, some squats were mini squats and some were deep squats but pt performs well when able to hold attention to complete them  X 1 lap sidestepping with PT " mirror" feedback with mixed results   REST Stance on airex pad without UE support x 3 reps x 30 seconds  -improved stability this session compared to last, constant cues for gaze and hip orientation but able to hold position without UE assisst for 30 second intervals, requires tactile cues to let go of // bars with Ues   REST Ambulation over 2 steps in // bars one 4 in and one airex pad , x 3 times through. Fatigue noted on second lap and pt reports she is tired when asked by PT  REST STS ambulation across // bars to transport  then stand to sit transfer with set up assistance and cues from PT  -attempted 5 STS here but pt unable to keep attention to task.   Max cues required throughout for sustained attention of pt to task at hand.  Pt has more distraction this date and seemingly increase in LE fatigue AEB LE trembling with gait and reluctance to participate in some activities.     HOME EXERCISE PROGRAM: Access Code: [facility] URL: [email or web address] Date: 07/28/2021 Prepared  by: Rivka Barbara  Exercises - Seated Long Arc Quad  - 1 x daily - 7 x weekly - 2 sets - 10 reps -Sit to stand- 1 x daily - 7 x weekly - 2 sets - 10 reps  Marching: 1 x daily - 7 x weekly - 2 sets - 10 reps   Access Code: [email or web address] URL: [email or web address] Date: 08/04/2021 Prepared by: Janna Arch  Exercises - Key Pinch with Putty  - 1 x daily - 7 x weekly - 2 sets - 10 reps - 5 hold - Hand Squeezes  - 1 x daily - 7 x weekly - 2 sets - 10 reps - 5 hold - Tip Pinch with Putty  - 1 x daily - 7 x weekly - 2 sets - 10 reps - 5 hold   PT Short Term Goals -       PT SHORT TERM GOAL #1  Title Patient will perform all transfers with Stand by assist using least restrictive AD,  BUE Support, and reaching back for safety to improve her functional independence and decrease fall risk.    Baseline 07/21/21: requires Min A and significant UE utilizaiton  9/14: SBA and UE use but completes safely.    Time 6    Period Weeks    Status Met, performing transfers to and from transport chair to armchair utilizing B upper extremity assist able to do it independently and safely with only standby assist from physical therapist             PT [place] #1   Title Patient/caregiver will report indepence with HEP with caregiver assistance.    Baseline 6/15: not copmelting HEP regularly/ father unsure of home program/ activities  9/14: been completing regularly    Time 8    Period Weeks    Status MET   Target Date 09/15/21      PT LONG TERM GOAL #2   Title Pt will decrease 5XSTS with UE use as needed by at least  15 seconds in order to demonstrate clinically significant improvement in LE strength.    Baseline 6/15: 61.54 sec with UE A 9/14: 25.01 sec, UE assist, LE trembling 12/7:1:11, difficulty with attention to task and potentially fatigue from walking endurance testing prior to this test. Hard to gauge fatigue secondary to pts communication  difficulties. 1/5: 17 sec with UE on chair arms throughout.    Time 12    Period Weeks    Status ONGOING    Target Date 04/07/2022        PT LONG TERM GOAL #3   Title Pt will decrease TUG to below 34 seconds/decrease in order to demonstrate decreased fall risk.    Baseline 03/17/2021= 43.49 sec using RW 9/14: 40.6 sec  12/7: 32.8 sec   Time 12    Period Weeks    Status ONGOING    Target Date 04/07/2022        PT LONG TERM GOAL #4   Title Patient will demo ability to ambulate > 400 ft w/ LRAD versus hand held assist min assist to demonstrate improved household mobility and short community distances.    Baseline 03/17/2021= 125 feet with Min A and RW and cues for proximity to walker 9/14: 225 ft in 3:44 sec  12/7: 310 feet, motivation required, cues for proximity to walker throughout. Pt pushes walker to right consistently and has to make adjustments frequently to keep forward line of progression. Pt also has difficulty with kicking walker with R LE.    Time 12    Period Weeks    Status ONGOING    Target Date 04/07/2022        PT LONG TERM GOAL #5   Title Pt will improve FOTO to target score to display perceived improvements in ability to complete ADL's.    Baseline 6/15: no assessed secondary to father not knowing answers to questions, ptsent home with QR code for mother to complete survey at home.    Time 12    Period Weeks    Status Defer to next visit, QR code sent with father to give to mother for filling out. 9/14: pt mother performing at home between sessions 9/21: 47 ( 4 point increase) 12/8: Met   Target Date 04/07/22      PT LONG TERM GOAL #6   Title  Pt will increase 10MWT by at least 0.15 m/s in order to demonstrate clinically significant improvement in community ambulation.    Baseline 03/17/2021= 0.24 m/s using RW and min A from PT for walker management  9/14: .108ms 12/7: .315m, assistance with maintining strainght line of progression.    Time 12    Period Weeks    Status  ONGOING    Target Date 04/07/2022                 Plan -     Clinical Impression Statement  Pt presents with good motivation for completion of PT activities. All activities completed in // bars this date and activities focussed on training transfers and ambulation. Pt still has difficulty with retro gait and has difficulty with UE management in coordination with Les. Pt LE begin to tremble with fatigue and rest required at these times. Pt did work well in // bars compared to standard support bar or walker for ambulation tasks and will recommend to use this for future sessions as it is available for use during treatment times. Pt reluctant / has difficulty with STS transitions with fatigue, hard to distinguish between fatigue and distractibility in these situations.  Pt will continue to benefit from skilled physical therapy intervention to address impairments, improve QOL, and attain therapy goals.     Personal Factors and Comorbidities Behavior Pattern;Transportation;Comorbidity 3+    Comorbidities HTN, CAD, HLD    Examination-Activity Limitations Bed Mobility;Dressing;Hygiene/Grooming;Caring for Others;Stairs;Stand;Toileting;Transfers;Sit;Bathing;Continence;Lift;Squat;Reach Overhead    Examination-Participation Restrictions Occupation;Medication Management;Cleaning;Yard Work;Personal Finances;Driving    Stability/Clinical Decision Making Evolving/Moderate complexity    Rehab Potential Good    PT Frequency 1x / week    PT Duration 12 weeks    PT Treatment/Interventions ADLs/Self Care Home Management;Aquatic Therapy;Electrical Stimulation;DME Instruction;Gait training;Stair training;Functional mobility training;Therapeutic activities;Therapeutic exercise;Balance training;Neuromuscular re-education;Wheelchair mobility training;Patient/family education;Orthotic Fit/Training;Manual techniques;Passive range of motion;Cryotherapy;Moist Heat    PT Next Visit Plan Functional mobility training,  cognitive remediation tasks   PT Home Exercise Plan Access Code: [email or web address]  Consulted and Agree with Plan of Care Patient;Family member/caregiver    Family Member Consulted Dad           8:17 AM, 03/24/22    ChParticia LatherPT 03/24/2022, 8:17 AM

## 2022-03-24 ENCOUNTER — Encounter: Payer: Self-pay | Admitting: Physical Therapy

## 2022-03-28 NOTE — Therapy (Unsigned)
OUTPATIENT PHYSICAL THERAPY TREATMENT NOTE       Patient Name: Beverly [name] MRN: [id number] DOB:1985-04-28, 37 y.o., female Today's Date: 03/29/2022  PCP: Ane Payment, [place] REFERRING PROVIDER: Meredith Staggers, MD   PT End of Session - 03/29/22 1605     Visit Number 25    Number of Visits 25    Date for PT Re-Evaluation 04/06/22    Authorization Type Medicaid approval- 3 visits for OT/PT/SLP combined    Authorization Time Period 2/7-4/30/24 12 visits    Authorization - Visit Number 3    Authorization - Number of Visits 12    Progress Note Due on Visit 30    PT Start Time 1600    PT Stop Time 1638    PT Time Calculation (min) 38 min    Equipment Utilized During Treatment Gait belt    Activity Tolerance Patient tolerated treatment well;No increased pain    Behavior During Therapy Impulsive;Restless                           Past Medical History:  Diagnosis Date   Brain injury [facility])    CAD (coronary artery disease)    HLD (hyperlipidemia)    Hypertension    last pregnancy   Ischemic cardiomyopathy    STEMI (ST elevation myocardial infarction) ([place]) 09/2019   SCAD with aneurysmal dilation of proximal LAD.   Vaginal Pap smear, abnormal    when she was 37yo   Ventricular fibrillation ([place]) 09/23/2019   Past Surgical History:  Procedure Laterality Date   CARDIAC CATHETERIZATION     IR GASTROSTOMY TUBE MOD SED  10/07/2019   IR GASTROSTOMY TUBE REMOVAL  07/02/2020   IR [place] GASTRO/COLONIC TUBE PERCUT W/FLUORO  11/03/2021   LEFT HEART CATH AND CORONARY ANGIOGRAPHY N/A 09/23/2019   Procedure: LEFT HEART CATH AND CORONARY ANGIOGRAPHY;  Surgeon: Nelva Bush, MD;  Location: [place] [facility];  Service: Cardiovascular;  Laterality: N/A;   SUBQ ICD IMPLANT N/A 11/26/2020   Procedure: SUBQ ICD IMPLANT;  Surgeon: Vickie Epley, MD;  Location: [place] [facility];  Service: Cardiovascular;  Laterality: N/A;   Patient Active Problem  List   Diagnosis Date Noted   Apraxia 10/05/2021   S/P ICD (internal cardiac defibrillator) procedure 11/27/2020   Hx of Cardiac arrest ([place]) 11/26/2020   Abnormality of gait 03/16/2020   Sleep disturbance 03/16/2020   Oropharyngeal dysphagia 02/12/2020   Coronary artery disease involving native coronary artery of native heart without angina pectoris 12/12/2019   Ischemic cardiomyopathy 12/12/2019   Brain injury ([place])    Prediabetes    Anoxic brain injury ([place]) 11/05/2019   Dysphagia 11/05/2019   Physical deconditioning 11/05/2019   Acute delirium 11/05/2019   Respiratory failure ([place])    Encounter for central line placement    Ventricular fibrillation ([place]) 09/23/2019   Acute combined systolic and diastolic heart failure ([place]) 09/23/2019   Ventricular tachycardia, polymorphic ([place]) 09/23/2019   Pelvic pain affecting pregnancy 10/15/2015   Supervision of normal pregnancy in third trimester 09/28/2015   Poor weight gain of pregnancy 09/28/2015   Iron deficiency anemia of pregnancy 09/01/2015   Increased BMI (body mass index) 07/29/2015    REFERRING DIAG: G93.1 (ICD-10-CM) - Anoxic brain injury ([place])  THERAPY DIAG:  Difficulty in walking, not elsewhere classified  Abnormality of gait and mobility  Unsteadiness on feet  Other abnormalities of gait and mobility  Muscle weakness (generalized)  Rationale for  Evaluation and Treatment Rehabilitation  PERTINENT HISTORY: Patient is a 37 year old with diagnois of anoxic brain injury. She was receiving outpatient PT until Feb. 2022 last year but ran out of visits. Patient has past medical history of Anoxic Brain Injury 09/2019, CAD, HLD, HTN, ISCHEMIC CARDIMYOPATHY, STEMI, V-FIB, ICD on 11/29/2020  .On 09/23/19 patient presented to ED 2 weeks postpartum (had emergent C-Section) with chest pain. In ED sustained VFib Cardiac Arrest requiring intubation. EKG showed ST elevation, Cath Lab showed aneurysmal dissection of the LAD was  identified. Course complicated by encephalopathy, anoxic brain injury, enterobacterial PNA. Patient is currently living with mom. Mother reports she is abel to walk some with a walker with supervision but primarily ambulates with HHA of mother who is her guardian. Mother also reports she requires assist for all transfers and ADL's.   PRECAUTIONS: Fall  SUBJECTIVE: Pt reports she is doing well. Her father reports no changes he is aware of since last session.   PAIN:  Are you having pain? No     TODAY'S TREATMENT:03/29/22 TA  All  interventions take increased time for cueing and attention to task   // bar ambulation forward and retro walking x 3  laps , external cues to prevent flexed trunk posture throughout.    REST  Stance on airex pad without UE support x 3 reps x 30 seconds  -improved stability this session compared to last, constant cues for gaze and hip orientation but able to hold position without UE assisst for 30 second intervals, requires tactile cues to let go of // bars with Ues   REST   10 x squats with UE assist, some squats were mini squats and some were deep squats but pt performs well when able to hold attention to complete them. Throughout task pt will turn and look around room sometimes at objects but sometimes looking at various inanimate objects. Pt very difficulty to keep on task this date.   REST  Ambulation over 2 steps in // bars one 4 in and one airex pad , x 1 times through. Pt proceeds to take seated rest break despite PT instruction   REST  STS ambulation across // bars to transport  then stand to sit transfer with set up assistance and cues from PT  -attempted 5 STS at start of this activity  but pt unable to keep attention or understand task and proceded to ambulate across // bars.   Max cues required throughout for sustained attention of pt to task at hand.  Pt has more distraction this date and seemingly increase in LE fatigue AEB LE trembling  with gait and reluctance to participate in some activities. Pt highly distract able and has trouble understanding tasks to be completed.     HOME EXERCISE PROGRAM: Access Code: [facility] URL: [email or web address] Date: 07/28/2021 Prepared by: Rivka Barbara  Exercises - Seated Long Arc Quad  - 1 x daily - 7 x weekly - 2 sets - 10 reps -Sit to stand- 1 x daily - 7 x weekly - 2 sets - 10 reps  Marching: 1 x daily - 7 x weekly - 2 sets - 10 reps   Access Code: [email or web address] URL: [email or web address] Date: 08/04/2021 Prepared by: Janna Arch  Exercises - Key Pinch with Putty  - 1 x daily - 7 x weekly - 2 sets - 10 reps - 5 hold - Hand Squeezes  - 1 x daily - 7 x weekly - 2 sets - 10 reps -  5 hold - Tip Pinch with Putty  - 1 x daily - 7 x weekly - 2 sets - 10 reps - 5 hold   PT Short Term Goals -       PT SHORT TERM GOAL #1   Title Patient will perform all transfers with Stand by assist using least restrictive AD,  BUE Support, and reaching back for safety to improve her functional independence and decrease fall risk.    Baseline 07/21/21: requires Min A and significant UE utilizaiton  9/14: SBA and UE use but completes safely.    Time 6    Period Weeks    Status Met, performing transfers to and from transport chair to armchair utilizing B upper extremity assist able to do it independently and safely with only standby assist from physical therapist             PT [place] #1   Title Patient/caregiver will report indepence with HEP with caregiver assistance.    Baseline 6/15: not copmelting HEP regularly/ father unsure of home program/ activities  9/14: been completing regularly    Time 8    Period Weeks    Status MET   Target Date 09/15/21      PT LONG TERM GOAL #2   Title Pt will decrease 5XSTS with UE use as needed by at least  15 seconds in order to demonstrate clinically significant improvement in LE  strength.    Baseline 6/15: 61.54 sec with UE A 9/14: 25.01 sec, UE assist, LE trembling 12/7:1:11, difficulty with attention to task and potentially fatigue from walking endurance testing prior to this test. Hard to gauge fatigue secondary to pts communication difficulties. 1/5: 17 sec with UE on chair arms throughout.    Time 12    Period Weeks    Status ONGOING    Target Date 04/07/2022        PT LONG TERM GOAL #3   Title Pt will decrease TUG to below 34 seconds/decrease in order to demonstrate decreased fall risk.    Baseline 03/17/2021= 43.49 sec using RW 9/14: 40.6 sec  12/7: 32.8 sec   Time 12    Period Weeks    Status ONGOING    Target Date 04/07/2022        PT LONG TERM GOAL #4   Title Patient will demo ability to ambulate > 400 ft w/ LRAD versus hand held assist min assist to demonstrate improved household mobility and short community distances.    Baseline 03/17/2021= 125 feet with Min A and RW and cues for proximity to walker 9/14: 225 ft in 3:44 sec  12/7: 310 feet, motivation required, cues for proximity to walker throughout. Pt pushes walker to right consistently and has to make adjustments frequently to keep forward line of progression. Pt also has difficulty with kicking walker with R LE.    Time 12    Period Weeks    Status ONGOING    Target Date 04/07/2022        PT LONG TERM GOAL #5   Title Pt will improve FOTO to target score to display perceived improvements in ability to complete ADL's.    Baseline 6/15: no assessed secondary to father not knowing answers to questions, ptsent home with QR code for mother to complete survey at home.    Time 12    Period Weeks    Status Defer to next visit, QR  code sent with father to give to mother for filling out. 9/14: pt mother performing at home between sessions 9/21: 47 ( 4 point increase) 12/8: Met   Target Date 04/07/22      PT LONG TERM GOAL #6   Title Pt will increase 10MWT by at least 0.15 m/s in order to demonstrate  clinically significant improvement in community ambulation.    Baseline 03/17/2021= 0.24 m/s using RW and min A from PT for walker management  9/14: .26ms 12/7: .389m, assistance with maintining strainght line of progression.    Time 12    Period Weeks    Status ONGOING    Target Date 04/07/2022                 Plan -     Clinical Impression Statement  Pt presents with low motivation for completion of PT activities. All activities completed in // bars this date and activities focussed on training transfers and ambulation. Pt still has difficulty with retro gait and has difficulty with UE management in coordination with Les.  Patient attention to task was very poor this date and is very distracted secondary to clinic environment as well as impaired attention.  Patient has recert note neck session were goals will be assessed and continuation of therapy will be assessed as well.    Personal Factors and Comorbidities Behavior Pattern;Transportation;Comorbidity 3+    Comorbidities HTN, CAD, HLD    Examination-Activity Limitations Bed Mobility;Dressing;Hygiene/Grooming;Caring for Others;Stairs;Stand;Toileting;Transfers;Sit;Bathing;Continence;Lift;Squat;Reach Overhead    Examination-Participation Restrictions Occupation;Medication Management;Cleaning;Yard Work;Personal Finances;Driving    Stability/Clinical Decision Making Evolving/Moderate complexity    Rehab Potential Good    PT Frequency 1x / week    PT Duration 12 weeks    PT Treatment/Interventions ADLs/Self Care Home Management;Aquatic Therapy;Electrical Stimulation;DME Instruction;Gait training;Stair training;Functional mobility training;Therapeutic activities;Therapeutic exercise;Balance training;Neuromuscular re-education;Wheelchair mobility training;Patient/family education;Orthotic Fit/Training;Manual techniques;Passive range of motion;Cryotherapy;Moist Heat    PT Next Visit Plan Functional mobility training, cognitive remediation  tasks   PT Home Exercise Plan Access Code: [email or web address]  Consulted and Agree with Plan of Care Patient;Family member/caregiver    Family Member Consulted Dad           4:58 PM, 03/29/22    ChParticia LatherPT 03/29/2022, 4:58 PM

## 2022-03-29 ENCOUNTER — Encounter: Payer: Self-pay | Admitting: Physical Therapy

## 2022-03-29 ENCOUNTER — Other Ambulatory Visit: Payer: Self-pay | Admitting: Physical Medicine & Rehabilitation

## 2022-03-29 ENCOUNTER — Ambulatory Visit: Payer: 59 | Admitting: Physical Therapy

## 2022-03-29 DIAGNOSIS — R262 Difficulty in walking, not elsewhere classified: Secondary | ICD-10-CM

## 2022-03-29 DIAGNOSIS — R482 Apraxia: Secondary | ICD-10-CM

## 2022-03-29 DIAGNOSIS — R2689 Other abnormalities of gait and mobility: Secondary | ICD-10-CM

## 2022-03-29 DIAGNOSIS — R269 Unspecified abnormalities of gait and mobility: Secondary | ICD-10-CM

## 2022-03-29 DIAGNOSIS — R1312 Dysphagia, oropharyngeal phase: Secondary | ICD-10-CM

## 2022-03-29 DIAGNOSIS — R2681 Unsteadiness on feet: Secondary | ICD-10-CM

## 2022-03-29 DIAGNOSIS — M6281 Muscle weakness (generalized): Secondary | ICD-10-CM

## 2022-03-29 DIAGNOSIS — G931 Anoxic brain damage, not elsewhere classified: Secondary | ICD-10-CM

## 2022-03-31 ENCOUNTER — Ambulatory Visit: Payer: 59 | Admitting: Internal Medicine

## 2022-03-31 NOTE — Progress Notes (Deleted)
Follow-up Outpatient Visit Date: 03/31/2022  Primary Care Provider: [facility] [address] [place] [address]  Chief Complaint: ***  HPI:  Beverly [name] is a 37 y.o. 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  She was last seen in our office in May by Ignacia Bayley, NP, at which time she was fairly stable.  No medication changes or additional testing were pursued.  Inappropriate ICD shock occurred in August secondary to oversensing artifact.  Device was reprogrammed at the direction of Dr. Quentin Ore.  --------------------------------------------------------------------------------------------------  Past Medical History:  Diagnosis Date   Brain injury [facility])    CAD (coronary artery disease)    HLD (hyperlipidemia)    Hypertension    last pregnancy   Ischemic cardiomyopathy    STEMI (ST elevation myocardial infarction) ([place]) 09/2019   SCAD with aneurysmal dilation of proximal LAD.   Vaginal Pap smear, abnormal    when she was 37yo   Ventricular fibrillation ([place]) 09/23/2019   Past Surgical History:  Procedure Laterality Date   CARDIAC CATHETERIZATION     IR GASTROSTOMY TUBE MOD SED  10/07/2019   IR GASTROSTOMY TUBE REMOVAL  07/02/2020   IR [place] GASTRO/COLONIC TUBE PERCUT W/FLUORO  11/03/2021   LEFT HEART CATH AND CORONARY ANGIOGRAPHY N/A 09/23/2019   Procedure: LEFT HEART CATH AND CORONARY ANGIOGRAPHY;  Surgeon: Nelva Bush, MD;  Location: [place] [facility];  Service: Cardiovascular;  Laterality: N/A;   SUBQ ICD IMPLANT N/A 11/26/2020   Procedure: SUBQ ICD IMPLANT;  Surgeon: Vickie Epley, MD;  Location: [place] [facility];  Service: Cardiovascular;  Laterality: N/A;    No outpatient  medications have been marked as taking for the 03/31/22 encounter (Appointment) with Ashantee Deupree, Harrell Gave, MD.    Allergies: Patient has no known allergies.  Social History   Tobacco Use   Smoking status: Never   Smokeless tobacco: Never  Vaping Use   Vaping Use: Never used  Substance Use Topics   Alcohol use: Not Currently    Alcohol/week: 0.0 standard drinks of alcohol    Comment: quit 2017   Drug use: No    Frequency: 5.0 times per week    Types: Marijuana    Comment: stopped when she found out she was pregnant    Family History  Problem Relation Age of Onset   Hyperlipidemia Mother    Hypertension Mother    Diabetes Maternal Grandmother    Seizures Maternal Grandmother    Thyroid disease Maternal Grandmother    Diabetes Paternal Grandmother    Rashes / Skin problems Son        ezcema   Seizures Son     Review of Systems: A 12-system review of systems was performed and was negative except as noted in the HPI.  --------------------------------------------------------------------------------------------------  Physical Exam: There were no vitals taken for this visit.  General:  NAD. Neck: No JVD or HJR. Lungs: Clear to auscultation bilaterally without wheezes or crackles. Heart: Regular rate and rhythm without murmurs, rubs, or gallops. Abdomen: Soft, nontender, nondistended. Extremities: No lower extremity edema.  EKG:  ***  Lab Results  Component Value Date   WBC 5.7 11/03/2020   HGB 13.1 11/03/2020   HCT 38.8 11/03/2020   MCV 88 11/03/2020   PLT 360 11/03/2020  Lab Results  Component Value Date   NA 137 06/17/2021   K 4.0 06/17/2021   CL 104 06/17/2021   CO2 26 06/17/2021   BUN 11 06/17/2021   CREATININE 0.43 (L) 06/17/2021   GLUCOSE 115 (H) 06/17/2021   ALT 29 06/17/2021    Lab Results  Component Value Date   CHOL 117 06/17/2021   HDL 49 06/17/2021   LDLCALC 55 06/17/2021   LDLDIRECT 55.3 06/17/2021   TRIG 67 06/17/2021   CHOLHDL 2.4  06/17/2021    --------------------------------------------------------------------------------------------------  ASSESSMENT AND PLAN: Nelva Bush, MD 03/31/2022 5:48 AM

## 2022-04-02 ENCOUNTER — Other Ambulatory Visit: Payer: Self-pay | Admitting: Physical Medicine & Rehabilitation

## 2022-04-03 NOTE — Telephone Encounter (Signed)
Refill request came in from pharmacy for carbidopa-levodopa 25-100 I po tid. Last note says continue tid. Refilled.

## 2022-04-05 ENCOUNTER — Ambulatory Visit: Payer: 59 | Admitting: Physical Therapy

## 2022-04-05 DIAGNOSIS — R2681 Unsteadiness on feet: Secondary | ICD-10-CM

## 2022-04-05 DIAGNOSIS — R262 Difficulty in walking, not elsewhere classified: Secondary | ICD-10-CM

## 2022-04-05 DIAGNOSIS — R2689 Other abnormalities of gait and mobility: Secondary | ICD-10-CM

## 2022-04-05 DIAGNOSIS — R269 Unspecified abnormalities of gait and mobility: Secondary | ICD-10-CM

## 2022-04-05 DIAGNOSIS — M6281 Muscle weakness (generalized): Secondary | ICD-10-CM

## 2022-04-05 NOTE — Therapy (Signed)
OUTPATIENT PHYSICAL THERAPY TREATMENT NOTE       Patient Name: Beverly [name] MRN: [id number] DOB:[birth year], 38 y.o., female Today's Date: 04/06/2022  PCP: Ane Payment, [place] REFERRING PROVIDER: Meredith Staggers, MD   PT End of Session - 04/05/22 1608     Visit Number 26    Number of Visits 26    Date for PT Re-Evaluation 04/06/22    Authorization Type Medicaid approval- 44 visits for OT/PT/SLP combined    Authorization Time Period 2/7-4/30/24 12 visits    Authorization - Visit Number 4    Authorization - Number of Visits 12    Progress Note Due on Visit 30    PT Start Time [id number]    PT Stop Time 1640    PT Time Calculation (min) 32 min    Equipment Utilized During Treatment Gait belt    Activity Tolerance Patient tolerated treatment well;No increased pain    Behavior During Therapy Impulsive;Restless                            Past Medical History:  Diagnosis Date   Brain injury [facility])    CAD (coronary artery disease)    HLD (hyperlipidemia)    Hypertension    last pregnancy   Ischemic cardiomyopathy    STEMI (ST elevation myocardial infarction) ([place]) 09/2019   SCAD with aneurysmal dilation of proximal LAD.   Vaginal Pap smear, abnormal    when she was 37yo   Ventricular fibrillation ([place]) 09/23/2019   Past Surgical History:  Procedure Laterality Date   CARDIAC CATHETERIZATION     IR GASTROSTOMY TUBE MOD SED  10/07/2019   IR GASTROSTOMY TUBE REMOVAL  07/02/2020   IR [place] GASTRO/COLONIC TUBE PERCUT W/FLUORO  11/03/2021   LEFT HEART CATH AND CORONARY ANGIOGRAPHY N/A 09/23/2019   Procedure: LEFT HEART CATH AND CORONARY ANGIOGRAPHY;  Surgeon: Nelva Bush, MD;  Location: [facility];  Service: Cardiovascular;  Laterality: N/A;   SUBQ ICD IMPLANT N/A 11/26/2020   Procedure: SUBQ ICD IMPLANT;  Surgeon: Vickie Epley, MD;  Location: [facility];  Service: Cardiovascular;  Laterality: N/A;   Patient Active  Problem List   Diagnosis Date Noted   Apraxia 10/05/2021   S/P ICD (internal cardiac defibrillator) procedure 11/27/2020   Hx of Cardiac arrest ([place]) 11/26/2020   Abnormality of gait 03/16/2020   Sleep disturbance 03/16/2020   Oropharyngeal dysphagia 02/12/2020   Coronary artery disease involving native coronary artery of native heart without angina pectoris 12/12/2019   Ischemic cardiomyopathy 12/12/2019   Brain injury ([place])    Prediabetes    Anoxic brain injury ([place]) 11/05/2019   Dysphagia 11/05/2019   Physical deconditioning 11/05/2019   Acute delirium 11/05/2019   Respiratory failure ([place])    Encounter for central line placement    Ventricular fibrillation ([place]) 09/23/2019   Acute combined systolic and diastolic heart failure ([place]) 09/23/2019   Ventricular tachycardia, polymorphic ([place]) 09/23/2019   Pelvic pain affecting pregnancy 10/15/2015   Supervision of normal pregnancy in third trimester 09/28/2015   Poor weight gain of pregnancy 09/28/2015   Iron deficiency anemia of pregnancy 09/01/2015   Increased BMI (body mass index) 07/29/2015    REFERRING DIAG: G93.1 (ICD-10-CM) - Anoxic brain injury ([place])  THERAPY DIAG:  Difficulty in walking, not elsewhere classified  Abnormality of gait and mobility  Unsteadiness on feet  Other abnormalities of gait and mobility  Muscle weakness (generalized)  Rationale  for Evaluation and Treatment Rehabilitation  PERTINENT HISTORY: Patient is a 37 year old with diagnois of anoxic brain injury. She was receiving outpatient PT until Feb. 2022 last year but ran out of visits. Patient has past medical history of Anoxic Brain Injury 09/2019, CAD, HLD, HTN, ISCHEMIC CARDIMYOPATHY, STEMI, V-FIB, ICD on 11/29/2020  .On 09/23/19 patient presented to ED 2 weeks postpartum (had emergent C-Section) with chest pain. In ED sustained VFib Cardiac Arrest requiring intubation. EKG showed ST elevation, Cath Lab showed aneurysmal dissection of the LAD was  identified. Course complicated by encephalopathy, anoxic brain injury, enterobacterial PNA. Patient is currently living with mom. Mother reports she is abel to walk some with a walker with supervision but primarily ambulates with HHA of mother who is her guardian. Mother also reports she requires assist for all transfers and ADL's.   PRECAUTIONS: Fall  SUBJECTIVE: Pt reports she is doing well. Her father reports no changes he is aware of since last session. Pt able to verbalize a few words this date with appropriate responses to questions.   PAIN:  Are you having pain? No     TODAY'S TREATMENT:04/06/22 TA  All  interventions take increased time for cueing and attention to task   // bar ambulation forward and retro walking x 3  laps , external cues to prevent flexed trunk posture throughout.   Donned 5# AW 10 x ea LE soccer kicks, good ability to respond to soccer ball as stimuli consistently, utilized on seated rest breaks   Stance on airex pad without UE support x 3 reps x 30 seconds  -ipmroved attention to task this session and improved efficacy with not using UE. Still requires max cues for attention but does complete appropriate activity with appropriate challenge this date.   5# AW 10 x ea LE soccer kicks, good ability to respond to soccer ball as stimuli consistently, utilized on seated rest breaks   10 x squats with UE assist, difficulty with attention to task. Attempted to use Thera Band post to pt for cue for squat depth but pt unable to keep sustained attention to this task   5# AW 10 x ea LE soccer kicks, good ability to respond to soccer ball as stimuli consistently, utilized on seated rest breaks   Ambulation over 2 steps in // bars one 4 in and one airex pad , x 1 times through. Pt transfers to transport chair on other end of // bars for end of session  Max cues required throughout for sustained attention of pt to task at hand.  Pt has more distraction this date and  seemingly increase in LE fatigue AEB LE trembling with gait and reluctance to participate in some activities. Pt highly distract able and has trouble understanding tasks to be completed.    HOME EXERCISE PROGRAM: Access Code: [facility] URL: [email or web address] Date: 07/28/2021 Prepared by: Rivka Barbara  Exercises - Seated Long Arc Quad  - 1 x daily - 7 x weekly - 2 sets - 10 reps -Sit to stand- 1 x daily - 7 x weekly - 2 sets - 10 reps  Marching: 1 x daily - 7 x weekly - 2 sets - 10 reps   Access Code: [email or web address] URL: [email or web address] Date: 08/04/2021 Prepared by: Janna Arch  Exercises - Key Pinch with Putty  - 1 x daily - 7 x weekly - 2 sets - 10 reps - 5 hold - Hand Squeezes  - 1 x daily - 7 x weekly - 2  sets - 10 reps - 5 hold - Tip Pinch with Putty  - 1 x daily - 7 x weekly - 2 sets - 10 reps - 5 hold   PT Short Term Goals -       PT SHORT TERM GOAL #1   Title Patient will perform all transfers with Stand by assist using least restrictive AD,  BUE Support, and reaching back for safety to improve her functional independence and decrease fall risk.    Baseline 07/21/21: requires Min A and significant UE utilizaiton  9/14: SBA and UE use but completes safely.    Time 6    Period Weeks    Status Met, performing transfers to and from transport chair to armchair utilizing B upper extremity assist able to do it independently and safely with only standby assist from physical therapist             PT [place] #1   Title Patient/caregiver will report indepence with HEP with caregiver assistance.    Baseline 6/15: not copmelting HEP regularly/ father unsure of home program/ activities  9/14: been completing regularly    Time 8    Period Weeks    Status MET   Target Date 09/15/21      PT LONG TERM GOAL #2   Title Pt will decrease 5XSTS with UE use as needed by at least  15 seconds in order to demonstrate  clinically significant improvement in LE strength.    Baseline 6/15: 61.54 sec with UE A 9/14: 25.01 sec, UE assist, LE trembling 12/7:1:11, difficulty with attention to task and potentially fatigue from walking endurance testing prior to this test. Hard to gauge fatigue secondary to pts communication difficulties. 1/5: 17 sec with UE on chair arms throughout.    Time 12    Period Weeks    Status ONGOING    Target Date 04/07/2022        PT LONG TERM GOAL #3   Title Pt will decrease TUG to below 34 seconds/decrease in order to demonstrate decreased fall risk.    Baseline 03/17/2021= 43.49 sec using RW 9/14: 40.6 sec  12/7: 32.8 sec   Time 12    Period Weeks    Status ONGOING    Target Date 04/07/2022        PT LONG TERM GOAL #4   Title Patient will demo ability to ambulate > 400 ft w/ LRAD versus hand held assist min assist to demonstrate improved household mobility and short community distances.    Baseline 03/17/2021= 125 feet with Min A and RW and cues for proximity to walker 9/14: 225 ft in 3:44 sec  12/7: 310 feet, motivation required, cues for proximity to walker throughout. Pt pushes walker to right consistently and has to make adjustments frequently to keep forward line of progression. Pt also has difficulty with kicking walker with R LE.    Time 12    Period Weeks    Status ONGOING    Target Date 04/07/2022        PT LONG TERM GOAL #5   Title Pt will improve FOTO to target score to display perceived improvements in ability to complete ADL's.    Baseline 6/15: no assessed secondary to father not knowing answers to questions, ptsent home with QR code for mother to complete survey at home.    Time 12    Period Weeks    Status  Defer to next visit, QR code sent with father to give to mother for filling out. 9/14: pt mother performing at home between sessions 9/21: 47 ( 4 point increase) 12/8: Met   Target Date 04/07/22      PT LONG TERM GOAL #6   Title Pt will increase 10MWT by at least  0.15 m/s in order to demonstrate clinically significant improvement in community ambulation.    Baseline 03/17/2021= 0.24 m/s using RW and min A from PT for walker management  9/14: .67ms 12/7: .311m, assistance with maintining strainght line of progression.    Time 12    Period Weeks    Status ONGOING    Target Date 04/07/2022                 Plan -     Clinical Impression Statement  Pt presents with improved  motivation for completion of PT activities. All activities completed in // bars this date and activities focussed on training transfers and ambulation with addition of LE strength training. Pt still has difficulty with retro gait and has difficulty with UE management in coordination with Les.  Patient attention to task was fair this date and is very distracted secondary to clinic environment as well as impaired attention.  Patient has recert note next session were goals will be assessed and continuation of therapy will be assessed as well.    Personal Factors and Comorbidities Behavior Pattern;Transportation;Comorbidity 3+    Comorbidities HTN, CAD, HLD    Examination-Activity Limitations Bed Mobility;Dressing;Hygiene/Grooming;Caring for Others;Stairs;Stand;Toileting;Transfers;Sit;Bathing;Continence;Lift;Squat;Reach Overhead    Examination-Participation Restrictions Occupation;Medication Management;Cleaning;Yard Work;Personal Finances;Driving    Stability/Clinical Decision Making Evolving/Moderate complexity    Rehab Potential Good    PT Frequency 1x / week    PT Duration 12 weeks    PT Treatment/Interventions ADLs/Self Care Home Management;Aquatic Therapy;Electrical Stimulation;DME Instruction;Gait training;Stair training;Functional mobility training;Therapeutic activities;Therapeutic exercise;Balance training;Neuromuscular re-education;Wheelchair mobility training;Patient/family education;Orthotic Fit/Training;Manual techniques;Passive range of motion;Cryotherapy;Moist Heat     PT Next Visit Plan Functional mobility training, cognitive remediation tasks   PT Home Exercise Plan Access Code: [email or web address]  Consulted and Agree with Plan of Care Patient;Family member/caregiver    Family Member Consulted Dad           8:00 AM, 04/06/22    ChParticia LatherPT 04/06/2022, 8:00 AM

## 2022-04-05 NOTE — Progress Notes (Signed)
Remote ICD transmission.   

## 2022-04-06 ENCOUNTER — Encounter: Payer: Self-pay | Admitting: Physical Therapy

## 2022-04-12 ENCOUNTER — Ambulatory Visit: Payer: 59 | Admitting: Physical Therapy

## 2022-04-18 NOTE — Therapy (Signed)
OUTPATIENT PHYSICAL THERAPY TREATMENT NOTE  / RECERT     Patient Name: Beverly [name] MRN: [id number] DOB:[birth year], 37 y.o., female Today's Date: 04/20/2022  PCP: Delman Cheadle, PA REFERRING PROVIDER: Ranelle Oyster, MD   PT End of Session - 04/19/22 1609     Visit Number 27    Number of Visits 28    Date for PT Re-Evaluation 05/04/22    Authorization Type Medicaid approval- 27 visits for OT/PT/SLP combined    Authorization Time Period 2/7-4/30/24 12 visits    Authorization - Visit Number 5    Authorization - Number of Visits 12    Progress Note Due on Visit 30    PT Start Time 1604    PT Stop Time 1642    PT Time Calculation (min) 38 min    Equipment Utilized During Treatment Gait belt    Activity Tolerance Patient tolerated treatment well;No increased pain    Behavior During Therapy Impulsive;Restless                             Past Medical History:  Diagnosis Date   Brain injury [facility])    CAD (coronary artery disease)    HLD (hyperlipidemia)    Hypertension    last pregnancy   Ischemic cardiomyopathy    STEMI (ST elevation myocardial infarction) ([facility]) 09/2019   SCAD with aneurysmal dilation of proximal LAD.   Vaginal Pap smear, abnormal    when she was 37yo   Ventricular fibrillation ([facility]) 09/23/2019   Past Surgical History:  Procedure Laterality Date   CARDIAC CATHETERIZATION     IR GASTROSTOMY TUBE MOD SED  10/07/2019   IR GASTROSTOMY TUBE REMOVAL  07/02/2020   IR [place] GASTRO/COLONIC TUBE PERCUT W/FLUORO  11/03/2021   LEFT HEART CATH AND CORONARY ANGIOGRAPHY N/A 09/23/2019   Procedure: LEFT HEART CATH AND CORONARY ANGIOGRAPHY;  Surgeon: Yvonne Kendall, MD;  Location: [facility] INVASIVE CV LAB;  Service: Cardiovascular;  Laterality: N/A;   SUBQ ICD IMPLANT N/A 11/26/2020   Procedure: SUBQ ICD IMPLANT;  Surgeon: Lanier Prude, MD;  Location: [facility] INVASIVE CV LAB;  Service: Cardiovascular;  Laterality: N/A;   Patient  Active Problem List   Diagnosis Date Noted   Apraxia 10/05/2021   S/P ICD (internal cardiac defibrillator) procedure 11/27/2020   Hx of Cardiac arrest ([facility]) 11/26/2020   Abnormality of gait 03/16/2020   Sleep disturbance 03/16/2020   Oropharyngeal dysphagia 02/12/2020   Coronary artery disease involving native coronary artery of native heart without angina pectoris 12/12/2019   Ischemic cardiomyopathy 12/12/2019   Brain injury ([facility])    Prediabetes    Anoxic brain injury ([facility]) 11/05/2019   Dysphagia 11/05/2019   Physical deconditioning 11/05/2019   Acute delirium 11/05/2019   Respiratory failure ([facility])    Encounter for central line placement    Ventricular fibrillation ([facility]) 09/23/2019   Acute combined systolic and diastolic heart failure ([facility]) 09/23/2019   Ventricular tachycardia, polymorphic ([facility]) 09/23/2019   Pelvic pain affecting pregnancy 10/15/2015   Supervision of normal pregnancy in third trimester 09/28/2015   Poor weight gain of pregnancy 09/28/2015   Iron deficiency anemia of pregnancy 09/01/2015   Increased BMI (body mass index) 07/29/2015    REFERRING DIAG: G93.1 (ICD-10-CM) - Anoxic brain injury ([facility])  THERAPY DIAG:  Difficulty in walking, not elsewhere classified  Abnormality of gait and mobility  Unsteadiness on feet  Other abnormalities of gait and mobility  Muscle weakness (generalized)  Rationale for Evaluation and Treatment Rehabilitation  PERTINENT HISTORY: Patient is a 37 year old with diagnois of anoxic brain injury. She was receiving outpatient PT until Feb. 2022 last year but ran out of visits. Patient has past medical history of Anoxic Brain Injury 09/2019, CAD, HLD, HTN, ISCHEMIC CARDIMYOPATHY, STEMI, V-FIB, ICD on 11/29/2020  .On 09/23/19 patient presented to ED 2 weeks postpartum (had emergent C-Section) with chest pain. In ED sustained VFib Cardiac Arrest requiring intubation. EKG showed ST elevation, Cath Lab showed aneurysmal dissection of the  LAD was identified. Course complicated by encephalopathy, anoxic brain injury, enterobacterial PNA. Patient is currently living with mom. Mother reports she is abel to walk some with a walker with supervision but primarily ambulates with HHA of mother who is her guardian. Mother also reports she requires assist for all transfers and ADL's.   PRECAUTIONS: Fall  SUBJECTIVE: Pt reports she is doing well. Her father reports no changes he is aware of since last session.  Patient having difficulty verbalizing words appropriate response to questions.  PAIN:  Are you having pain? No     TODAY'S TREATMENT: 04/19/22 TE  Physical therapy treatment session today consisted of completing assessment of goals and administration of testing as demonstrated and documented in flow sheet, treatment, and goals section of this note. Addition treatments may be found below.   Max cues required throughout for sustained attention of pt to task at hand.  Pt has more distraction this date and seemingly increase in LE fatigue AEB LE trembling with gait and reluctance to participate in some activities. Pt highly distract able and has trouble understanding tasks to be completed.    HOME EXERCISE PROGRAM: Access Code: [facility] URL: [email or web address] Date: 07/28/2021 Prepared by: Thresa Ross  Exercises - Seated Long Arc Quad  - 1 x daily - 7 x weekly - 2 sets - 10 reps -Sit to stand- 1 x daily - 7 x weekly - 2 sets - 10 reps  Marching: 1 x daily - 7 x weekly - 2 sets - 10 reps   Access Code: [email or web address] URL: [email or web address] Date: 08/04/2021 Prepared by: Precious Bard  Exercises - Key Pinch with Putty  - 1 x daily - 7 x weekly - 2 sets - 10 reps - 5 hold - Hand Squeezes  - 1 x daily - 7 x weekly - 2 sets - 10 reps - 5 hold - Tip Pinch with Putty  - 1 x daily - 7 x weekly - 2 sets - 10 reps - 5 hold   PT Short Term Goals -       PT SHORT TERM GOAL #1   Title Patient  will perform all transfers with Stand by assist using least restrictive AD,  BUE Support, and reaching back for safety to improve her functional independence and decrease fall risk.    Baseline 07/21/21: requires Min A and significant UE utilizaiton  9/14: SBA and UE use but completes safely.    Time 6    Period Weeks    Status Met, performing transfers to and from transport chair to armchair utilizing B upper extremity assist able to do it independently and safely with only standby assist from physical therapist             PT Long Term Goals       PT LONG TERM GOAL #1   Title Patient/caregiver will report indepence with HEP with caregiver assistance.    Baseline 6/15: not copmelting HEP regularly/ father  unsure of home program/ activities  9/14: been completing regularly    Time 8    Period Weeks    Status MET   Target Date 09/15/21      PT LONG TERM GOAL #2   Title Pt will decrease 5XSTS with UE use as needed by at least  15 seconds in order to demonstrate clinically significant improvement in LE strength.    Baseline 6/15: 61.54 sec with UE A 9/14: 25.01 sec, UE assist, LE trembling 12/7:1:11, difficulty with attention to task and potentially fatigue from walking endurance testing prior to this test. Hard to gauge fatigue secondary to pts communication difficulties. 1/5: 17 sec with UE on chair arms throughout. 3/13:37 sec with UE A   Time 12    Period Weeks    Status ONGOING    Target Date 04/07/2022        PT LONG TERM GOAL #3   Title Pt will decrease TUG to below 34 seconds/decrease in order to demonstrate decreased fall risk.    Baseline 03/17/2021= 43.49 sec using RW 9/14: 40.6 sec  12/7: 32.8 sec 3/13: 91 sec with RW ( difficulty with turn and stand to sit transition (turning) significantly limiting ability to complete in safe and timely manner. Pt also demonstrated consistent list to the right which she has to constantly adjust, with U hand hold assist on the right side pt  completes without list and in 34 seconds ( likely how she is assisted with walking at home when mobilizing)    Time 12    Period Weeks    Status ONGOING    Target Date 04/07/2022        PT LONG TERM GOAL #4   Title Patient will demo ability to ambulate > 400 ft w/ LRAD versus hand held assist min assist to demonstrate improved household mobility and short community distances.    Baseline 03/17/2021= 125 feet with Min A and RW and cues for proximity to walker 9/14: 225 ft in 3:44 sec  12/7: 310 feet, motivation required, cues for proximity to walker throughout. Pt pushes walker to right consistently and has to make adjustments frequently to keep forward line of progression. Pt also has difficulty with kicking walker with R LE.  3/13: 180 ft, with bariatric walker  min A at times as pt attempts to sit secondary to what appears to be fatigue    Time 12    Period Weeks    Status ONGOING    Target Date 04/07/2022        PT LONG TERM GOAL #5   Title Pt will improve FOTO to target score to display perceived improvements in ability to complete ADL's.    Baseline 6/15: no assessed secondary to father not knowing answers to questions, ptsent home with QR code for mother to complete survey at home.    Time 12    Period Weeks    Status Defer to next visit, QR code sent with father to give to mother for filling out. 9/14: pt mother performing at home between sessions 9/21: 47 ( 4 point increase) 12/8: Met   Target Date 04/07/22      PT LONG TERM GOAL #6   Title Pt will increase by at least 0.15 m/s in order to demonstrate clinically significant improvement in community ambulation.    Baseline 03/17/2021= 0.24 m/s using RW and min A from PT for walker management  9/14: .81m/s 12/7: .30m/s, assistance with maintining strainght line of  progression.  04/19/22:27.7 sec .36 m/s   Time 12    Period Weeks    Status ONGOING    Target Date 04/07/2022                 Plan -     Clinical Impression  Statement  Pt presents to PT for goal reassessment. Pt has stopped making progress toward her functional goals in past several goal assessments. This was discussed with her Father and plan for discharge discussed. Pt limited by attention and limited ability to communicate and respond to cues in PT sessions as well as difficulty determining carryover between session for home program. Pt has made progress since initial evaluation but her maximum potential for PT seems to have been reached at this time and at this point in her recovery.  Patient will have 1 more session where home exercise program will be updated and reviewed with patient to complete at home with assistance from her mother as they are able.  This plan was discussed with her father and he will report information to her mother as he is able.  Physical therapist encouraged  the father to instruct the mother to  call with any questions.   Personal Factors and Comorbidities Behavior Pattern;Transportation;Comorbidity 3+    Comorbidities HTN, CAD, HLD    Examination-Activity Limitations Bed Mobility;Dressing;Hygiene/Grooming;Caring for Others;Stairs;Stand;Toileting;Transfers;Sit;Bathing;Continence;Lift;Squat;Reach Overhead    Examination-Participation Restrictions Occupation;Medication Management;Cleaning;Yard Work;Personal Finances;Driving    Stability/Clinical Decision Making Evolving/Moderate complexity    Rehab Potential Good    PT Frequency 1x / week    PT Duration 12 weeks    PT Treatment/Interventions ADLs/Self Care Home Management;Aquatic Therapy;Electrical Stimulation;DME Instruction;Gait training;Stair training;Functional mobility training;Therapeutic activities;Therapeutic exercise;Balance training;Neuromuscular re-education;Wheelchair mobility training;Patient/family education;Orthotic Fit/Training;Manual techniques;Passive range of motion;Cryotherapy;Moist Heat    PT Next Visit Plan Functional mobility training, cognitive remediation  tasks   PT Home Exercise Plan Access Code: [email or web address]    Consulted and Agree with Plan of Care Patient;Family member/caregiver    Family Member Consulted Dad           9:46 AM, 04/20/22    Norman Herrlich, PT 04/20/2022, 9:46 AM

## 2022-04-19 ENCOUNTER — Ambulatory Visit: Payer: 59 | Attending: Physical Medicine & Rehabilitation | Admitting: Physical Therapy

## 2022-04-19 DIAGNOSIS — R262 Difficulty in walking, not elsewhere classified: Secondary | ICD-10-CM | POA: Diagnosis not present

## 2022-04-19 DIAGNOSIS — M6281 Muscle weakness (generalized): Secondary | ICD-10-CM | POA: Diagnosis present

## 2022-04-19 DIAGNOSIS — R269 Unspecified abnormalities of gait and mobility: Secondary | ICD-10-CM | POA: Insufficient documentation

## 2022-04-19 DIAGNOSIS — R2689 Other abnormalities of gait and mobility: Secondary | ICD-10-CM | POA: Insufficient documentation

## 2022-04-19 DIAGNOSIS — R2681 Unsteadiness on feet: Secondary | ICD-10-CM | POA: Insufficient documentation

## 2022-04-20 ENCOUNTER — Encounter: Payer: Self-pay | Admitting: Physical Therapy

## 2022-04-26 ENCOUNTER — Ambulatory Visit: Payer: 59 | Admitting: Physical Therapy

## 2022-04-26 DIAGNOSIS — R2689 Other abnormalities of gait and mobility: Secondary | ICD-10-CM

## 2022-04-26 DIAGNOSIS — R269 Unspecified abnormalities of gait and mobility: Secondary | ICD-10-CM

## 2022-04-26 DIAGNOSIS — R262 Difficulty in walking, not elsewhere classified: Secondary | ICD-10-CM

## 2022-04-26 DIAGNOSIS — R2681 Unsteadiness on feet: Secondary | ICD-10-CM

## 2022-04-26 DIAGNOSIS — M6281 Muscle weakness (generalized): Secondary | ICD-10-CM

## 2022-04-26 NOTE — Therapy (Signed)
OUTPATIENT PHYSICAL THERAPY TREATMENT NOTE  / Discharge Therapy      Patient Name: Beverly [name] MRN: [id number] DOB:[birth year], 37 y.o., female Today's Date: 04/26/2022  PCP: Ane Payment, Wilder REFERRING PROVIDER: Meredith Staggers, MD   PT End of Session - 04/26/22 1607     Visit Number 28    Number of Visits 28    Date for PT Re-Evaluation 05/04/22    Authorization Type Medicaid approval- 27 visits for OT/PT/SLP combined    Authorization Time Period 2/7-4/30/24 12 visits    Authorization - Visit Number 6    Authorization - Number of Visits 12    Progress Note Due on Visit 30    PT Start Time [id number]    PT Stop Time 1642    PT Time Calculation (min) 44 min    Equipment Utilized During Treatment Gait belt    Activity Tolerance Patient tolerated treatment well;No increased pain    Behavior During Therapy Impulsive;Restless                              Past Medical History:  Diagnosis Date   Brain injury [facility])    CAD (coronary artery disease)    HLD (hyperlipidemia)    Hypertension    last pregnancy   Ischemic cardiomyopathy    STEMI (ST elevation myocardial infarction) ([place]) 09/2019   SCAD with aneurysmal dilation of proximal LAD.   Vaginal Pap smear, abnormal    when she was 37yo   Ventricular fibrillation ([place]) 09/23/2019   Past Surgical History:  Procedure Laterality Date   CARDIAC CATHETERIZATION     IR GASTROSTOMY TUBE MOD SED  10/07/2019   IR GASTROSTOMY TUBE REMOVAL  07/02/2020   IR [place] GASTRO/COLONIC TUBE PERCUT W/FLUORO  11/03/2021   LEFT HEART CATH AND CORONARY ANGIOGRAPHY N/A 09/23/2019   Procedure: LEFT HEART CATH AND CORONARY ANGIOGRAPHY;  Surgeon: Nelva Bush, MD;  Location: [place] [facility];  Service: Cardiovascular;  Laterality: N/A;   SUBQ ICD IMPLANT N/A 11/26/2020   Procedure: SUBQ ICD IMPLANT;  Surgeon: Vickie Epley, MD;  Location: [place] [facility];  Service: Cardiovascular;  Laterality: N/A;    Patient Active Problem List   Diagnosis Date Noted   Apraxia 10/05/2021   S/P ICD (internal cardiac defibrillator) procedure 11/27/2020   Hx of Cardiac arrest ([place]) 11/26/2020   Abnormality of gait 03/16/2020   Sleep disturbance 03/16/2020   Oropharyngeal dysphagia 02/12/2020   Coronary artery disease involving native coronary artery of native heart without angina pectoris 12/12/2019   Ischemic cardiomyopathy 12/12/2019   Brain injury ([place])    Prediabetes    Anoxic brain injury ([place]) 11/05/2019   Dysphagia 11/05/2019   Physical deconditioning 11/05/2019   Acute delirium 11/05/2019   Respiratory failure ([place])    Encounter for central line placement    Ventricular fibrillation ([place]) 09/23/2019   Acute combined systolic and diastolic heart failure ([place]) 09/23/2019   Ventricular tachycardia, polymorphic ([place]) 09/23/2019   Pelvic pain affecting pregnancy 10/15/2015   Supervision of normal pregnancy in third trimester 09/28/2015   Poor weight gain of pregnancy 09/28/2015   Iron deficiency anemia of pregnancy 09/01/2015   Increased BMI (body mass index) 07/29/2015    REFERRING DIAG: G93.1 (ICD-10-CM) - Anoxic brain injury ([place])  THERAPY DIAG:  Difficulty in walking, not elsewhere classified  Abnormality of gait and mobility  Unsteadiness on feet  Other abnormalities of gait and mobility  Muscle weakness (generalized)  Rationale for Evaluation and Treatment Rehabilitation  PERTINENT HISTORY: Patient is a 37 year old with diagnois of anoxic brain injury. She was receiving outpatient PT until Feb. 2022 last year but ran out of visits. Patient has past medical history of Anoxic Brain Injury 09/2019, CAD, HLD, HTN, ISCHEMIC CARDIMYOPATHY, STEMI, V-FIB, ICD on 11/29/2020  .On 09/23/19 patient presented to ED 2 weeks postpartum (had emergent C-Section) with chest pain. In ED sustained VFib Cardiac Arrest requiring intubation. EKG showed ST elevation, Cath Lab showed aneurysmal  dissection of the LAD was identified. Course complicated by encephalopathy, anoxic brain injury, enterobacterial PNA. Patient is currently living with mom. Mother reports she is abel to walk some with a walker with supervision but primarily ambulates with HHA of mother who is her guardian. Mother also reports she requires assist for all transfers and ADL's.   PRECAUTIONS: Fall  SUBJECTIVE: Pt father reports pt came from speech therapy earlier today. Pt  father unsure of progress with speech as [name] took her to this therapy   PAIN:  Are you having pain? No     TODAY'S TREATMENT: 04/26/22  TE\ Pt instructed in the following updated HEP,  - Sit to Stand with Counter Support  - 4 x weekly - 10 reps - Side Stepping with Counter Support  - 4 x weekly - 2 sets - 10 reps - Standing March with Counter Support  - 1 x daily - 4 x weekly - 2 sets - 10 reps - Heel Raises with Counter Support  - 1 x daily - 4 x weekly - 2 sets - 10 reps  Seated LAQ ball kicks x 20 total reps across both LE, cues for returning foot to starting position.   Standing on airex x 2 min with intermittent UE support despite frequent cues for no UE support.  Ambulation with RW from treatment gym to elevator sign, frequent list to the right despite cues, pt gets fatigued around 125 feet but is abel to make full 175 feet bout. Required assistance getting seated in chair   Pt required occasional rest breaks due fatigue, PT was quick to ask when pt appeared to be fatiguing in order to prevent excessive fatigue. Hard to distinguish between fatigue and inattention as pt very distracted by environment (although environment relatively free of distracters in secluded corner of clinic gym)    HOME EXERCISE PROGRAM: Access Code: [email or web address] URL: [email or web address] Date: 04/26/2022 Prepared by: Rivka Barbara  Exercises - Sit to Stand with Counter Support  - 4 x weekly - 10 reps - Side Stepping with Counter  Support  - 4 x weekly - 2 sets - 10 reps - Standing March with Counter Support  - 1 x daily - 4 x weekly - 2 sets - 10 reps - Heel Raises with Counter Support  - 1 x daily - 4 x weekly - 2 sets - 10 reps   PT Short Term Goals -       PT SHORT TERM GOAL #1   Title Patient will perform all transfers with Stand by assist using least restrictive AD,  BUE Support, and reaching back for safety to improve her functional independence and decrease fall risk.    Baseline 07/21/21: requires Min A and significant UE utilizaiton  9/14: SBA and UE use but completes safely.    Time 6    Period Weeks    Status Met, performing transfers to and from transport chair to armchair utilizing B  upper extremity assist able to do it independently and safely with only standby assist from physical therapist             PT [place] #1   Title Patient/caregiver will report indepence with HEP with caregiver assistance.    Baseline 6/15: not copmelting HEP regularly/ father unsure of home program/ activities  9/14: been completing regularly    Time 8    Period Weeks    Status MET   Target Date 09/15/21      PT LONG TERM GOAL #2   Title Pt will decrease 5XSTS with UE use as needed by at least  15 seconds in order to demonstrate clinically significant improvement in LE strength.    Baseline 6/15: 61.54 sec with UE A 9/14: 25.01 sec, UE assist, LE trembling 12/7:1:11, difficulty with attention to task and potentially fatigue from walking endurance testing prior to this test. Hard to gauge fatigue secondary to pts communication difficulties. 1/5: 17 sec with UE on chair arms throughout. 3/13:37 sec with UE A   Time 12    Period Weeks    Status ONGOING    Target Date 04/07/2022        PT LONG TERM GOAL #3   Title Pt will decrease TUG to below 34 seconds/decrease in order to demonstrate decreased fall risk.    Baseline 03/17/2021= 43.49 sec using RW 9/14: 40.6 sec  12/7: 32.8 sec 3/13:  91 sec with RW ( difficulty with turn and stand to sit transition (turning) significantly limiting ability to complete in safe and timely manner. Pt also demonstrated consistent list to the right which she has to constantly adjust, with U hand hold assist on the right side pt completes without list and in 34 seconds ( likely how she is assisted with walking at home when mobilizing)    Time 12    Period Weeks    Status ONGOING    Target Date 04/07/2022        PT LONG TERM GOAL #4   Title Patient will demo ability to ambulate > 400 ft w/ LRAD versus hand held assist min assist to demonstrate improved household mobility and short community distances.    Baseline 03/17/2021= 125 feet with Min A and RW and cues for proximity to walker 9/14: 225 ft in 3:44 sec  12/7: 310 feet, motivation required, cues for proximity to walker throughout. Pt pushes walker to right consistently and has to make adjustments frequently to keep forward line of progression. Pt also has difficulty with kicking walker with R LE.  3/13: 180 ft, with bariatric walker  min A at times as pt attempts to sit secondary to what appears to be fatigue    Time 12    Period Weeks    Status ONGOING    Target Date 04/07/2022        PT LONG TERM GOAL #5   Title Pt will improve FOTO to target score to display perceived improvements in ability to complete ADL's.    Baseline 6/15: no assessed secondary to father not knowing answers to questions, ptsent home with QR code for mother to complete survey at home.    Time 12    Period Weeks    Status Defer to next visit, QR code sent with father to give to mother for filling out. 9/14: pt mother performing at home between sessions 9/21: 47 ( 4 point increase) 12/8:  Met   Target Date 04/07/22      PT LONG TERM GOAL #6   Title Pt will increase 10MWT by at least 0.15 m/s in order to demonstrate clinically significant improvement in community ambulation.    Baseline 03/17/2021= 0.24 m/s using RW and min A  from PT for walker management  9/14: .46m/s 12/7: .18m/s, assistance with maintining strainght line of progression.  04/19/22:27.7 sec .36 m/s   Time 12    Period Weeks    Status ONGOING    Target Date 04/07/2022                 Plan -     Clinical Impression Statement Pt presents for discharge therapy this date. Pt introduced to new and updated HEP with more standing and functional activities for continuation and maintenance of progress she has made with therapy. Pt instructs father to have mother call with any questions regarding plan. Pt will be discharged from skilled PT at this time.    Personal Factors and Comorbidities Behavior Pattern;Transportation;Comorbidity 3+    Comorbidities HTN, CAD, HLD    Examination-Activity Limitations Bed Mobility;Dressing;Hygiene/Grooming;Caring for Others;Stairs;Stand;Toileting;Transfers;Sit;Bathing;Continence;Lift;Squat;Reach Overhead    Examination-Participation Restrictions Occupation;Medication Management;Cleaning;Yard Work;Personal Finances;Driving    Stability/Clinical Decision Making Evolving/Moderate complexity    Rehab Potential Good    PT Frequency 1x / week    PT Duration 12 weeks    PT Treatment/Interventions ADLs/Self Care Home Management;Aquatic Therapy;Electrical Stimulation;DME Instruction;Gait training;Stair training;Functional mobility training;Therapeutic activities;Therapeutic exercise;Balance training;Neuromuscular re-education;Wheelchair mobility training;Patient/family education;Orthotic Fit/Training;Manual techniques;Passive range of motion;Cryotherapy;Moist Heat    PT Next Visit Plan Functional mobility training, cognitive remediation tasks   PT Home Exercise Plan Access Code: [email or web address]    Consulted and Agree with Plan of Care Patient;Family member/caregiver    Family Member Consulted Dad           4:10 PM, 04/26/22    Particia Lather, PT 04/26/2022, 4:10 PM

## 2022-05-04 ENCOUNTER — Ambulatory Visit: Payer: 59 | Admitting: Physical Therapy

## 2022-05-06 ENCOUNTER — Other Ambulatory Visit: Payer: Self-pay | Admitting: Physical Medicine & Rehabilitation

## 2022-05-06 DIAGNOSIS — R1312 Dysphagia, oropharyngeal phase: Secondary | ICD-10-CM

## 2022-05-06 DIAGNOSIS — G931 Anoxic brain damage, not elsewhere classified: Secondary | ICD-10-CM

## 2022-05-06 DIAGNOSIS — R482 Apraxia: Secondary | ICD-10-CM

## 2022-05-08 ENCOUNTER — Other Ambulatory Visit (HOSPITAL_COMMUNITY): Payer: Self-pay

## 2022-05-08 MED ORDER — METHYLPHENIDATE HCL 10 MG PO TABS: 15.0000 mg | ORAL_TABLET | Freq: Two times a day (BID) | ORAL | 0 refills | Status: DC

## 2022-05-08 MED ORDER — METHYLPHENIDATE HCL 10 MG PO TABS
15.0000 mg | ORAL_TABLET | Freq: Two times a day (BID) | ORAL | 0 refills | Status: DC
  Filled 2022-05-08: qty 90, 30d supply, fill #0

## 2022-05-11 ENCOUNTER — Ambulatory Visit: Payer: 59 | Attending: Internal Medicine | Admitting: Cardiology

## 2022-05-11 ENCOUNTER — Encounter: Payer: Self-pay | Admitting: Cardiology

## 2022-05-11 ENCOUNTER — Ambulatory Visit: Payer: Medicaid Other | Admitting: Physical Therapy

## 2022-05-11 VITALS — BP 96/68 | HR 81 | Ht 69.0 in | Wt 169.2 lb

## 2022-05-11 DIAGNOSIS — E785 Hyperlipidemia, unspecified: Secondary | ICD-10-CM | POA: Diagnosis not present

## 2022-05-11 DIAGNOSIS — I255 Ischemic cardiomyopathy: Secondary | ICD-10-CM | POA: Diagnosis not present

## 2022-05-11 DIAGNOSIS — I5042 Chronic combined systolic (congestive) and diastolic (congestive) heart failure: Secondary | ICD-10-CM

## 2022-05-11 DIAGNOSIS — G931 Anoxic brain damage, not elsewhere classified: Secondary | ICD-10-CM

## 2022-05-11 DIAGNOSIS — I4901 Ventricular fibrillation: Secondary | ICD-10-CM | POA: Diagnosis not present

## 2022-05-11 NOTE — Patient Instructions (Signed)
Medication Instructions:  - Your physician recommends that you continue on your current medications as directed. Please refer to the Current Medication list given to you today.  *If you need a refill on your cardiac medications before your next appointment, please call your pharmacy*   Lab Work: - none ordered  If you have labs (blood work) drawn today and your tests are completely normal, you will receive your results only by: [place] (if you have MyChart) OR A paper copy in the mail If you have any lab test that is abnormal or we need to change your treatment, we will call you to review the results.   Testing/Procedures: - none ordered   Follow-Up: At [facility], you and your health needs are our priority.  As part of our continuing mission to provide you with exceptional heart care, we have created designated Provider Care Teams.  These Care Teams include your primary Cardiologist (physician) and Advanced Practice Providers (APPs -  Physician Assistants and Nurse Practitioners) who all work together to provide you with the care you need, when you need it.  We recommend signing up for the patient portal called "MyChart".  Sign up information is provided on this After Visit Summary.  MyChart is used to connect with patients for Virtual Visits (Telemedicine).  Patients are able to view lab/test results, encounter notes, upcoming appointments, etc.  Non-urgent messages can be sent to your provider as well.   To learn more about what you can do with MyChart, go to [email or web address].    Your next appointment:   1) Next Available with Dr. Quentin Ore (Device Follow up)   2) 6 months with Dr. Ardelle Lesches, NP  Provider:   As above     Other Instructions N/a

## 2022-05-11 NOTE — Progress Notes (Signed)
Cardiology Office Note:   Date:  05/11/2022  ID:  Beverly [name], DOB 09-Sep-1985, MRN [id number]  History of Present Illness:   Beverly [name] is a 37 y.o. 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  She had subcutaneous ICD implementation in 2022.  No issues were identified at the time of EP follow-up with Dr. Quentin Ore in January 2023.  She was last seen in clinic 06/17/2021 by Angelica Ran, NP.  Overall she has been stable since her last visit she had denied any pain or dyspnea.  Activity was quite limited and was trying to get PT and speech at home but Medicaid would not pay for it.  They were currently seeking for an in office practice that would take her insurance.  Her mother had been trying to walk her regularly but notes her efforts did not seem as good as what physical therapy can provide and she was concerned about her getting weaker.  She continued to have her feeding tube but denied any symptoms of decompensation.  There were no changes made to her medication regimen GDMT was unable to be escalated due to soft blood pressures  and there were no other further testing that was ordered at that time.  She returns to clinic today accompanied by her mother.  Today she is nonverbal and her mother is offering answers to questions.  She has remained stable since her last visit.  Mother states that she has had no chest pain or shortness of breath.  Occasionally she thinks there may be some pedal swelling. They are continuing to work on physical and speech therapy as she feels as though that she is getting weaker.  She stated that previously she was able to walk up stairs 11 of them in the house, now she can only do 4 they have had to purchase the lift to go up  and down the stairs.  She does continue to have her feeding tube.  She has not had any hospitalizations or visits into the emergency department.  Has been placed on Reglan but her mother seems to believe that it is not helping her focus.  ROS: 10 point review of systems was performed and was negative except as noted in the HPI  Studies Reviewed:    EKG: Sinus rhythm with a rate of 81, left posterior fascicular block  TTE 12/30/19  1. Left ventricular ejection fraction, by estimation, is 50 to 55%. The  left ventricle has low normal function. The left ventricle has no regional  wall motion abnormalities. Left ventricular diastolic parameters are  indeterminate.   2. The mitral valve is grossly normal. Trivial mitral valve  regurgitation.   Risk Assessment/Calculations:              Physical Exam:   VS:  BP 96/68 (BP Location: Left Arm, Patient Position: Sitting, Cuff Size: Normal)   Pulse 81   Ht 5\' 9"  (1.753 m)   Wt 169 lb 3.2 oz (76.7 kg)   SpO2 97%   BMI 24.99 kg/m    Wt Readings from Last 3 Encounters:  05/11/22 169 lb 3.2 oz (76.7 kg)  06/17/21 162 lb 2 oz (73.5 kg)  03/17/21 156 lb (70.8 kg)     GEN: Well nourished, well developed in no acute  distress, nonverbal today NECK: No JVD; No carotid bruits CARDIAC: RRR, no murmurs, rubs, gallops RESPIRATORY:  Clear to auscultation without rales, wheezing or rhonchi  ABDOMEN: Soft, non-tender, non-distended EXTREMITIES:  No edema; No deformity   ASSESSMENT AND PLAN:    Chronic combined systolic and diastolic just of heart failure/ischemic cardiomyopathy with previous EF of 12/2148-55% with no regional wall motion abnormalities.  Status post subcutaneous to AICD.  She is euvolemic on exam today mother denies any associated symptoms of shortness of breath dyspnea on exertion.  She remains on beta-blocker therapy with carvedilol but soft blood pressures limit the escalation of GDMT.   Ventricular fibrillation arrest status  post subcutaneous AICD has been followed by EP.  She is continue to do remote checks but has not had a recent visit with EP we will schedule face-to-face visit today.  Coronary artery disease/spontaneous coronary artery dissection status post V-fib arrest in the setting of scad to the ostial LAD of [id number], complicated by anoxic brain injury.  Patient's mother states she has not had any complaints of chest pain or dyspnea she remains on aspirin 81 mg daily, atorvastatin 40 mg daily, and carvedilol.  Hyperlipidemia she remains on statin therapy of atorvastatin 40 mg daily.  Last LDL was 55.  Anoxic brain injury followed by Dr Naaman Plummer who had previously been in communication with Dr. Saunders Revel about starting the patient on Reglan.  Unfortunately the mother thinks that the medication is not doing it with the anticipated result would have been.  She is concerned that she has been decreased in physical activity and that her cognitive is decreasing as well.   Disposition patient return to clinic to see MD/APP in 6 months or sooner if needed.       Signed, Draedyn Weidinger, NP

## 2022-05-12 ENCOUNTER — Other Ambulatory Visit (HOSPITAL_COMMUNITY): Payer: Self-pay

## 2022-05-12 ENCOUNTER — Telehealth: Payer: Self-pay | Admitting: Physical Medicine & Rehabilitation

## 2022-05-12 DIAGNOSIS — R1312 Dysphagia, oropharyngeal phase: Secondary | ICD-10-CM

## 2022-05-12 DIAGNOSIS — R482 Apraxia: Secondary | ICD-10-CM

## 2022-05-12 DIAGNOSIS — G931 Anoxic brain damage, not elsewhere classified: Secondary | ICD-10-CM

## 2022-05-12 MED ORDER — METHYLPHENIDATE HCL 10 MG PO TABS: 15.0000 mg | ORAL_TABLET | Freq: Two times a day (BID) | ORAL | 0 refills | Status: DC

## 2022-05-12 NOTE — Telephone Encounter (Signed)
Patients mom called to let us know that the patients medication was sent to wrong pharmacy.  It was sent to [facility] and needs to be sent to [facility] in [place].  Any questions please call [name] at [phone].

## 2022-05-12 NOTE — Telephone Encounter (Signed)
Rxes sent. How did pharmacy get changed on chart?

## 2022-05-15 ENCOUNTER — Other Ambulatory Visit: Payer: Self-pay

## 2022-05-15 MED ORDER — ATORVASTATIN CALCIUM 80 MG PO TABS: ORAL_TABLET | ORAL | 2 refills | Status: DC

## 2022-05-16 ENCOUNTER — Telehealth: Payer: Self-pay

## 2022-05-16 DIAGNOSIS — G479 Sleep disorder, unspecified: Secondary | ICD-10-CM

## 2022-05-16 MED ORDER — CARBIDOPA-LEVODOPA 25-100 MG PO TABS: 1.0000 | ORAL_TABLET | Freq: Three times a day (TID) | ORAL | 1 refills | Status: DC

## 2022-05-16 MED ORDER — TRAZODONE HCL 50 MG PO TABS: 50.0000 mg | ORAL_TABLET | Freq: Every day | ORAL | 2 refills | Status: DC

## 2022-05-16 NOTE — Telephone Encounter (Signed)
Faxed request to [facility]

## 2022-05-16 NOTE — Telephone Encounter (Signed)
Faxed request for [facility]

## 2022-05-16 NOTE — Progress Notes (Unsigned)
  Electrophysiology Office Follow up Visit Note:    Date:  05/16/2022   ID:  Beverly [name], DOB 11-29-1985, MRN [id number]  PCP:  [facility]  [facility] Cardiologist:  Yvonne Kendall, MD  [facility] Electrophysiologist:  Lanier Prude, MD    Interval History:    Beverly [name] is a 37 y.o. female who presents for a follow up visit.   Last seen 02/28/2021. She has a SubQ ICD in situ given a history of cardiac arrest. Device interrogations have shown stable devic function since implant.      Past medical, surgical, social and family history were reviewed.  ROS:   Please see the history of present illness.    All other systems reviewed and are negative.  EKGs/Labs/Other Studies Reviewed:    The following studies were reviewed today:  05/17/2022 in clinic device interrogation personally reviewed ***    Physical Exam:    VS:  There were no vitals taken for this visit.    Wt Readings from Last 3 Encounters:  05/11/22 169 lb 3.2 oz (76.7 kg)  06/17/21 162 lb 2 oz (73.5 kg)  03/17/21 156 lb (70.8 kg)     GEN: *** Well nourished, well developed in no acute distress CARDIAC: ***RRR, no murmurs, rubs, gallops RESPIRATORY:  Clear to auscultation without rales, wheezing or rhonchi       ASSESSMENT:    1. Ventricular fibrillation   2. Chronic combined systolic (congestive) and diastolic (congestive) heart failure   3. Spontaneous dissection of coronary artery   4. Anoxic brain injury   5. ICD (implantable cardioverter-defibrillator) in place    PLAN:    In order of problems listed above:   #Hx of VF arrest #ICD in situ Device functioning appropriately, continue remote monitoring.  Follow up 1 year with APP.        Signed, Steffanie Dunn, MD, [facility], [facility] 05/16/2022 8:44 PM    Electrophysiology [facility]

## 2022-05-17 ENCOUNTER — Encounter: Payer: Self-pay | Admitting: Cardiology

## 2022-05-17 ENCOUNTER — Ambulatory Visit: Payer: 59 | Attending: Cardiology | Admitting: Cardiology

## 2022-05-17 VITALS — BP 100/60 | HR 68 | Ht 69.0 in | Wt 169.0 lb

## 2022-05-17 DIAGNOSIS — G931 Anoxic brain damage, not elsewhere classified: Secondary | ICD-10-CM

## 2022-05-17 DIAGNOSIS — I4901 Ventricular fibrillation: Secondary | ICD-10-CM | POA: Diagnosis not present

## 2022-05-17 DIAGNOSIS — I2542 Coronary artery dissection: Secondary | ICD-10-CM

## 2022-05-17 DIAGNOSIS — I5042 Chronic combined systolic (congestive) and diastolic (congestive) heart failure: Secondary | ICD-10-CM

## 2022-05-17 DIAGNOSIS — Z95810 Presence of automatic (implantable) cardiac defibrillator: Secondary | ICD-10-CM

## 2022-05-17 NOTE — Patient Instructions (Signed)
Medication Instructions:  Your physician recommends that you continue on your current medications as directed. Please refer to the Current Medication list given to you today.  *If you need a refill on your cardiac medications before your next appointment, please call your pharmacy*  Follow-Up: At [facility], you and your health needs are our priority.  As part of our continuing mission to provide you with exceptional heart care, we have created designated Provider Care Teams.  These Care Teams include your primary Cardiologist (physician) and Advanced Practice Providers (APPs -  Physician Assistants and Nurse Practitioners) who all work together to provide you with the care you need, when you need it.  Your next appointment:   1 year(s)  Provider:   Suzann Riddle, NP  

## 2022-05-18 ENCOUNTER — Ambulatory Visit: Payer: Medicaid Other | Admitting: Physical Therapy

## 2022-05-25 ENCOUNTER — Ambulatory Visit: Payer: Medicaid Other | Admitting: Physical Therapy

## 2022-06-01 ENCOUNTER — Ambulatory Visit: Payer: Medicaid Other | Admitting: Physical Therapy

## 2022-06-08 ENCOUNTER — Ambulatory Visit: Payer: Medicaid Other | Admitting: Physical Therapy

## 2022-06-11 ENCOUNTER — Other Ambulatory Visit: Payer: Self-pay | Admitting: Physical Medicine & Rehabilitation

## 2022-06-11 DIAGNOSIS — G931 Anoxic brain damage, not elsewhere classified: Secondary | ICD-10-CM

## 2022-06-11 DIAGNOSIS — R482 Apraxia: Secondary | ICD-10-CM

## 2022-06-11 DIAGNOSIS — R1312 Dysphagia, oropharyngeal phase: Secondary | ICD-10-CM

## 2022-06-12 ENCOUNTER — Other Ambulatory Visit: Payer: Self-pay | Admitting: Physical Medicine & Rehabilitation

## 2022-06-12 DIAGNOSIS — G931 Anoxic brain damage, not elsewhere classified: Secondary | ICD-10-CM

## 2022-06-12 DIAGNOSIS — R482 Apraxia: Secondary | ICD-10-CM

## 2022-06-12 DIAGNOSIS — R1312 Dysphagia, oropharyngeal phase: Secondary | ICD-10-CM

## 2022-06-12 MED ORDER — METHYLPHENIDATE HCL 10 MG PO TABS: 15.0000 mg | ORAL_TABLET | Freq: Two times a day (BID) | ORAL | 0 refills | Status: DC

## 2022-06-12 NOTE — Telephone Encounter (Signed)
Deklynn already had an rx for 5/5. Literally, the one the pharmacy sent to refill had "DNF b/f 06/11/22" on the rx.   Either way, I filled another for this month and a second with a DNF  note

## 2022-06-15 ENCOUNTER — Ambulatory Visit (INDEPENDENT_AMBULATORY_CARE_PROVIDER_SITE_OTHER): Payer: 59

## 2022-06-15 ENCOUNTER — Ambulatory Visit: Payer: Medicaid Other | Admitting: Physical Therapy

## 2022-06-15 DIAGNOSIS — I255 Ischemic cardiomyopathy: Secondary | ICD-10-CM | POA: Diagnosis not present

## 2022-06-15 LAB — CUP PACEART REMOTE DEVICE CHECK
Battery Remaining Percentage: 78 %
Date Time Interrogation Session: 20240509091600
Implantable Pulse Generator Implant Date: 20221021
Pulse Gen Serial Number: 161805

## 2022-06-22 ENCOUNTER — Ambulatory Visit: Payer: Medicaid Other | Admitting: Physical Therapy

## 2022-06-29 ENCOUNTER — Ambulatory Visit: Payer: Medicaid Other | Admitting: Physical Therapy

## 2022-07-04 NOTE — Progress Notes (Signed)
Remote ICD transmission.   

## 2022-07-06 ENCOUNTER — Ambulatory Visit: Payer: Medicaid Other | Admitting: Physical Therapy

## 2022-07-10 ENCOUNTER — Other Ambulatory Visit: Payer: Self-pay | Admitting: Physical Medicine & Rehabilitation

## 2022-07-10 ENCOUNTER — Other Ambulatory Visit: Payer: Self-pay | Admitting: Internal Medicine

## 2022-07-10 DIAGNOSIS — G931 Anoxic brain damage, not elsewhere classified: Secondary | ICD-10-CM

## 2022-07-10 DIAGNOSIS — R1312 Dysphagia, oropharyngeal phase: Secondary | ICD-10-CM

## 2022-07-10 DIAGNOSIS — R482 Apraxia: Secondary | ICD-10-CM

## 2022-07-10 NOTE — Telephone Encounter (Signed)
Please advise if ok to refill. 

## 2022-07-10 NOTE — Telephone Encounter (Signed)
There is a rx on file please refuse.

## 2022-07-12 ENCOUNTER — Other Ambulatory Visit: Payer: Self-pay | Admitting: Physical Medicine & Rehabilitation

## 2022-07-12 DIAGNOSIS — G931 Anoxic brain damage, not elsewhere classified: Secondary | ICD-10-CM

## 2022-07-12 DIAGNOSIS — R482 Apraxia: Secondary | ICD-10-CM

## 2022-07-12 DIAGNOSIS — R1312 Dysphagia, oropharyngeal phase: Secondary | ICD-10-CM

## 2022-07-13 ENCOUNTER — Ambulatory Visit: Payer: Medicaid Other | Admitting: Physical Therapy

## 2022-07-13 MED ORDER — METHYLPHENIDATE HCL 10 MG PO TABS: 15.0000 mg | ORAL_TABLET | Freq: Two times a day (BID) | ORAL | 0 refills | Status: DC

## 2022-07-20 ENCOUNTER — Ambulatory Visit: Payer: Medicaid Other | Admitting: Physical Therapy

## 2022-07-26 ENCOUNTER — Encounter: Payer: Self-pay | Admitting: Physical Medicine & Rehabilitation

## 2022-07-26 ENCOUNTER — Encounter: Payer: 59 | Attending: Physical Medicine & Rehabilitation | Admitting: Physical Medicine & Rehabilitation

## 2022-07-26 VITALS — BP 96/67 | HR 73

## 2022-07-26 DIAGNOSIS — G931 Anoxic brain damage, not elsewhere classified: Secondary | ICD-10-CM | POA: Diagnosis present

## 2022-07-26 MED ORDER — METHYLPHENIDATE HCL 10 MG PO TABS: 20.0000 mg | ORAL_TABLET | Freq: Two times a day (BID) | ORAL | 0 refills | Status: DC

## 2022-07-26 NOTE — Progress Notes (Signed)
Subjective:    Patient ID: Beverly [name], female    DOB: [birth year], 37 y.o.   MRN: [id number]  HPI  Eldon is here in follow up of her anoxic BI. She has been more responsive in general per mom. However, she's still not initiating much. Vannah may be more interactive with kids. She can fluctuate from moment to moment. She has had a down couple days recently. Mom has had her on prevagen but hasn't noticed much of a difference cognitively  Mom reports she ate a [place] burger the other day. She will drink liquids at other times while sometimes she'll just hold it in her mouth.   Mom reports no problems with HR or BP. She maintains close f/u with cardiology.   Therapy is coming to the house and working on transfers and standing. She is walking 2-3 minutes with assistance/gait belt.    Pain Inventory Average Pain 0 Pain Right Now 0 My pain is  no pain  LOCATION OF PAIN  no pain  BOWEL Number of stools per week: 6-7 Oral laxative use No  Type of laxative . Enema or suppository use No  History of colostomy No  Incontinent No   BLADDER Pads In and out cath, frequency . Able to self cath  . Bladder incontinence Yes  Frequent urination No  Leakage with coughing No  Difficulty starting stream No  Incomplete bladder emptying No    Mobility walk with assistance how many minutes can you walk? 2 ability to climb steps?  yes do you drive?  no use a wheelchair needs help with transfers  Function not employed: date last employed 09/23/19 I need assistance with the following:  feeding, dressing, bathing, toileting, meal prep, household duties, and shopping  Neuro/Psych trouble walking spasms confusion  Prior Studies Any changes since last visit?  no  Physicians involved in your care Any changes since last visit?  no   Family History  Problem Relation Age of Onset   Hyperlipidemia Mother    Hypertension Mother    Diabetes Maternal Grandmother    Seizures  Maternal Grandmother    Thyroid disease Maternal Grandmother    Diabetes Paternal Grandmother    Rashes / Skin problems Son        ezcema   Seizures Son    Social History   Socioeconomic History   Marital status: Single    Spouse name: Not on file   Number of children: 3   Years of education: Not on file   Highest education level: Not on file  Occupational History   Occupation: oncology    Employer: [employer]    Comment: NA  Tobacco Use   Smoking status: Never   Smokeless tobacco: Never  Vaping Use   Vaping Use: Never used  Substance and Sexual Activity   Alcohol use: Not Currently    Alcohol/week: 0.0 standard drinks of alcohol    Comment: quit 2017   Drug use: No    Frequency: 5.0 times per week    Types: Marijuana    Comment: stopped when she found out she was pregnant   Sexual activity: Not Currently    Partners: Male    Birth control/protection: None  Other Topics Concern   Not on file  Social History Narrative   01/06/20 lives with mom and her children   Social Determinants of Health   Financial Resource Strain: Not on file  Food Insecurity: Not on file  Transportation Needs: Not on file  Physical Activity: Not on file  Stress: Not on file  Social Connections: Not on file   Past Surgical History:  Procedure Laterality Date   CARDIAC CATHETERIZATION     IR GASTROSTOMY TUBE MOD SED  10/07/2019   IR GASTROSTOMY TUBE REMOVAL  07/02/2020   IR REPLC GASTRO/COLONIC TUBE PERCUT W/FLUORO  11/03/2021   LEFT HEART CATH AND CORONARY ANGIOGRAPHY N/A 09/23/2019   Procedure: LEFT HEART CATH AND CORONARY ANGIOGRAPHY;  Surgeon: Yvonne Kendall, MD;  Location: [facility] INVASIVE CV LAB;  Service: Cardiovascular;  Laterality: N/A;   SUBQ ICD IMPLANT N/A 11/26/2020   Procedure: SUBQ ICD IMPLANT;  Surgeon: Lanier Prude, MD;  Location[facility] INVASIVE CV LAB;  Service: Cardiovascular;  Laterality: N/A;   Past Medical History:  Diagnosis Date   Brain injury (HCC)    CAD  (coronary artery disease)    HLD (hyperlipidemia)    Hypertension    last pregnancy   Ischemic cardiomyopathy    STEMI (ST elevation myocardial infarction) (HCC) 09/2019   SCAD with aneurysmal dilation of proximal LAD.   Vaginal Pap smear, abnormal    when she was 37yo   Ventricular fibrillation (HCC) 09/23/2019   BP 96/67   Pulse 73   SpO2 97%   Opioid Risk Score:   Fall Risk Score:  `1  Depression screen [facility] 2/9     03/22/2022    2:07 PM 10/05/2021    3:35 PM 08/03/2021    3:15 PM 06/01/2021    3:14 PM 03/23/2021    3:03 PM 02/02/2021    2:22 PM 09/08/2020    3:45 PM  Depression screen PHQ 2/9  Decreased Interest 1 0 0 0 0 0 0  Down, Depressed, Hopeless 1 0 0 0 0 0 0  PHQ - 2 Score 2 0 0 0 0 0 0      Review of Systems  Musculoskeletal:  Positive for gait problem.  Psychiatric/Behavioral:  Positive for confusion.   All other systems reviewed and are negative.     Objective:   Physical Exam  General: No acute distress HEENT: NCAT, EOMI, oral membranes moist Cards: reg rate  Chest: normal effort Abdomen: Soft, NT, ND Skin: dry, intact Extremities: no edema Psych: pleasant and appropriate    Skin: Warm and dry.  Intact.    Musc: No edema in extremities.  No tenderness in extremities. Neuro:Remains very alert. More engaging at times. Answers questions when stimulated emotionally. Still very dysarthric but a little clearer today. Does follow some simple one step commands  Some of her apraxia results of being internally distracted as well.  She still tends to puffs her cheeks. Moves all 4's.    Assessment & Plan:  1. Anoxic BI - substantial attention, initiation deficits as well as memory deficits              MRI/CTs unremarkable             -Recent ICD placement.           -Ritalin  -will increase to 20 mg bid. If it makes her anxious or restless, I advised to decrease to 10mg  bid.  -Heart rate and blood pressure remain under good control -continue sinemet to  25/100  tid. could consider a slight further titration of this -I suspect her best bet for cognitive recovery will come in the day to day interactions with family, therapy, etc. She still has a long road ahead of her.  2. Dysphagia:             -  ongoing oral motor apraxia and coordination. Seems to be closer to initiating.               -Cont PEG as a back up for now             -[facility] SLP for cognition and swallowing . -NO E-STIM,  -again an MBS ok, but I'm not sure what new will be learned. She swallows liquids and solids when she is focused enough                3. Gait abnormality             Cont ambulation with assistance               RW for balance, sometimes hand held assist only              3. Sleep disturbance: improved             Melatonin 3 mg daily --can take with trazodone            -trazodone at night time scheduled.  this has helped her sleep/wake quite a bit              -More alert during the day per mom   20 minutes of face to face patient care time were spent during this visit. All questions were encouraged and answered.  Follow up with me in 4 mos .

## 2022-07-26 NOTE — Patient Instructions (Addendum)
ALWAYS FEEL FREE TO CALL OUR OFFICE WITH ANY PROBLEMS OR QUESTIONS [phone]  **PLEASE NOTE** ALL MEDICATION REFILL REQUESTS (INCLUDING CONTROLLED SUBSTANCES) NEED TO BE MADE AT LEAST 7 DAYS PRIOR TO REFILL BEING DUE. ANY REFILL REQUESTS INSIDE THAT TIME FRAME MAY RESULT IN DELAYS IN RECEIVING YOUR PRESCRIPTION.    IF YOU FEEL THAT THE RITALIN IS MAKING Beverly [name] IS ANXIOUS OR RESTLES, BACK IT DOWN TO 1OMG TWICE DAILY.

## 2022-07-27 ENCOUNTER — Ambulatory Visit
Admission: RE | Admit: 2022-07-27 | Discharge: 2022-07-27 | Disposition: A | Discharge status: Home / Self Care | Payer: 59 | Source: Ambulatory Visit | Attending: Interventional Radiology | Admitting: Interventional Radiology

## 2022-07-27 ENCOUNTER — Other Ambulatory Visit: Payer: Self-pay | Admitting: Interventional Radiology

## 2022-07-27 ENCOUNTER — Ambulatory Visit: Payer: Medicaid Other | Admitting: Physical Therapy

## 2022-07-27 DIAGNOSIS — K9423 Gastrostomy malfunction: Secondary | ICD-10-CM

## 2022-08-03 ENCOUNTER — Ambulatory Visit: Payer: Medicaid Other | Admitting: Physical Therapy

## 2022-08-08 ENCOUNTER — Other Ambulatory Visit: Payer: Self-pay | Admitting: Physical Medicine & Rehabilitation

## 2022-08-09 MED ORDER — METHYLPHENIDATE HCL 10 MG PO TABS: 20.0000 mg | ORAL_TABLET | Freq: Two times a day (BID) | ORAL | 0 refills | Status: DC

## 2022-08-14 ENCOUNTER — Telehealth: Payer: Self-pay

## 2022-08-14 NOTE — Telephone Encounter (Signed)
Insurance will not cover 4 per day of Methylphenidate. Change directions or submitted a PA?

## 2022-08-15 ENCOUNTER — Other Ambulatory Visit: Payer: Self-pay | Admitting: Physical Medicine & Rehabilitation

## 2022-08-15 MED ORDER — METHYLPHENIDATE HCL 20 MG PO TABS: 20.0000 mg | ORAL_TABLET | Freq: Two times a day (BID) | ORAL | 0 refills | Status: DC

## 2022-08-15 NOTE — Telephone Encounter (Signed)
Rx sent in with 20mg  pill. I only wrote for one month so that we can see how she does with this.

## 2022-08-15 NOTE — Telephone Encounter (Signed)
New rx previously sent. I'm not able to deny this one.

## 2022-08-15 NOTE — Telephone Encounter (Signed)
Paitent's guardian notified Sandy Salaam

## 2022-09-14 ENCOUNTER — Ambulatory Visit (INDEPENDENT_AMBULATORY_CARE_PROVIDER_SITE_OTHER): Payer: 59

## 2022-09-14 DIAGNOSIS — I255 Ischemic cardiomyopathy: Secondary | ICD-10-CM

## 2022-09-15 LAB — CUP PACEART REMOTE DEVICE CHECK
Battery Remaining Percentage: 76 %
Date Time Interrogation Session: 20240809133500
Implantable Pulse Generator Implant Date: 20221021
Pulse Gen Serial Number: 161805

## 2022-09-19 ENCOUNTER — Other Ambulatory Visit: Payer: Self-pay | Admitting: Physical Medicine & Rehabilitation

## 2022-09-19 DIAGNOSIS — G931 Anoxic brain damage, not elsewhere classified: Secondary | ICD-10-CM

## 2022-09-20 MED ORDER — METHYLPHENIDATE HCL 20 MG PO TABS
20.0000 mg | ORAL_TABLET | Freq: Two times a day (BID) | ORAL | 0 refills | Status: AC
Start: 2022-09-20 — End: ?

## 2022-09-20 MED ORDER — METHYLPHENIDATE HCL 20 MG PO TABS: 20.0000 mg | ORAL_TABLET | Freq: Two times a day (BID) | ORAL | 0 refills | Status: DC

## 2022-09-20 NOTE — Telephone Encounter (Signed)
Rxs for ritalin refilled.

## 2022-10-03 ENCOUNTER — Other Ambulatory Visit: Payer: Self-pay | Admitting: Physical Medicine & Rehabilitation

## 2022-10-05 ENCOUNTER — Other Ambulatory Visit: Payer: Self-pay | Admitting: Physical Medicine & Rehabilitation

## 2022-10-23 ENCOUNTER — Other Ambulatory Visit: Payer: Self-pay | Admitting: Physical Medicine & Rehabilitation

## 2022-10-23 DIAGNOSIS — G479 Sleep disorder, unspecified: Secondary | ICD-10-CM

## 2022-10-24 ENCOUNTER — Other Ambulatory Visit: Payer: Self-pay | Admitting: Physical Medicine & Rehabilitation

## 2022-10-24 DIAGNOSIS — G479 Sleep disorder, unspecified: Secondary | ICD-10-CM

## 2022-10-31 NOTE — Progress Notes (Signed)
Remote ICD transmission.   

## 2022-11-07 ENCOUNTER — Encounter: Payer: Self-pay | Admitting: Cardiology

## 2022-11-07 ENCOUNTER — Ambulatory Visit: Payer: 59 | Attending: Cardiology | Admitting: Cardiology

## 2022-11-07 VITALS — BP 100/70 | HR 75 | Ht 69.0 in | Wt 184.8 lb

## 2022-11-07 DIAGNOSIS — I4901 Ventricular fibrillation: Secondary | ICD-10-CM

## 2022-11-07 DIAGNOSIS — I5042 Chronic combined systolic (congestive) and diastolic (congestive) heart failure: Secondary | ICD-10-CM | POA: Diagnosis not present

## 2022-11-07 DIAGNOSIS — E785 Hyperlipidemia, unspecified: Secondary | ICD-10-CM | POA: Diagnosis not present

## 2022-11-07 DIAGNOSIS — I2510 Atherosclerotic heart disease of native coronary artery without angina pectoris: Secondary | ICD-10-CM

## 2022-11-07 DIAGNOSIS — I255 Ischemic cardiomyopathy: Secondary | ICD-10-CM | POA: Diagnosis not present

## 2022-11-07 DIAGNOSIS — G931 Anoxic brain damage, not elsewhere classified: Secondary | ICD-10-CM

## 2022-11-07 DIAGNOSIS — Z95810 Presence of automatic (implantable) cardiac defibrillator: Secondary | ICD-10-CM

## 2022-11-07 DIAGNOSIS — I2542 Coronary artery dissection: Secondary | ICD-10-CM

## 2022-11-07 MED ORDER — ATORVASTATIN CALCIUM 40 MG PO TABS: 40.0000 mg | ORAL_TABLET | Freq: Every day | ORAL | 3 refills | Status: DC

## 2022-11-07 MED ORDER — CARVEDILOL 3.125 MG PO TABS: 3.1250 mg | ORAL_TABLET | Freq: Two times a day (BID) | ORAL | 3 refills | Status: DC

## 2022-11-07 NOTE — Progress Notes (Signed)
Cardiology Office Note:  .   Date:  11/07/2022  ID:  Beverly [name], DOB 1985/10/31, MRN [id number] PCP: [facility]  [facility] Providers Cardiologist:  Yvonne Kendall, MD Electrophysiologist:  Lanier Prude, MD    History of Present Illness: .   Beverly [name] is a 37 y.o. 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  Previously a subcutaneous ICD implant Tatian in 2022.  No issues were identified at the time of EP follow-up.  She was last seen in clinic 05/11/22.  She was accompanied by her mother.  She remained stable since her last visit.  Mother stated that she had denied any chest pain or shortness of breath but occasionally thinks there is some pedal edema.  No changes made to her medication regimen no further testing that was ordered.  She was encouraged to continue to do remote checks but had not had a visit with the EP for face-to-face visit that was scheduled at the time.  She was seen by Dr. Lalla Brothers 05/17/22.  Device interrogations that showed stable device function since implant.  No changes were made to medication or testing ordered.  She was to continue with remote monitoring.  She returns to clinic today accompanied by her mother.  She has been more interactive today during the visit but is still nonverbal but is not angry is not making eye contact.  Her mother is answering questions.  She has remained stable since her last visit.  Mother states that she has had no shortness of breath or chest discomfort.  She has been compliant with her medications but there are several questions related to her current medication regimen today.  She denies any recent hospitalizations or visits to the emergency department.  ROS: 10 point review  of systems has been reviewed and considered negative with exception of what is been listed in the HPI  Studies Reviewed: Marland Kitchen   EKG Interpretation Date/Time:  Tuesday November 07 2022 10:24:39 EDT Ventricular Rate:  75 PR Interval:  152 QRS Duration:  84 QT Interval:  296 QTC Calculation: 330 R Axis:   80  Text Interpretation: Normal sinus rhythm Low voltage QRS When compared with ECG of 27-Nov-2020 03:54, Confirmed by Charlsie Quest ([id number]) on 11/07/2022 10:31:41 AM    TTE 12/30/19  1. Left ventricular ejection fraction, by estimation, is 50 to 55%. The  left ventricle has low normal function. The left ventricle has no regional  wall motion abnormalities. Left ventricular diastolic parameters are  indeterminate.   2. The mitral valve is grossly normal. Trivial mitral valve  regurgitation.  Risk Assessment/Calculations:             Physical Exam:   VS:  BP 100/70 (BP Location: Right Arm, Patient Position: Sitting, Cuff Size: Normal)   Pulse 75   Ht 5\' 9"  (1.753 m)   Wt 184 lb 12.8 oz (83.8 kg)   SpO2 99%   BMI 27.29 kg/m    Wt Readings from Last 3 Encounters:  11/07/22 184 lb 12.8 oz (83.8 kg)  05/17/22 169 lb (76.7 kg)  05/11/22 169 lb 3.2 oz (76.7 kg)    GEN: Well nourished, well developed in no acute distress NECK: No JVD; No carotid bruits CARDIAC: RRR, no murmurs, rubs, gallops RESPIRATORY:  Clear to auscultation  without rales, wheezing or rhonchi  ABDOMEN: Soft, non-tender, non-distended EXTREMITIES:  No edema; No deformity   ASSESSMENT AND PLAN: .   Chronic combined systolic and diastolic heart failure/ischemic cardiomyopathy with previous EF of 50-25%, no RWMA, status post subcutaneous ICD.  She is euvolemic on exam today and mother denies any associated symptoms of shortness of breath, dyspnea, or peripheral edema.  She remains on carvedilol 3.125 mg twice daily with a refill sent in per patient request today.  Unfortunately she continues to have soft blood pressures  which limits the escalation of GDMT.  She has previously tried low-dose losartan and spironolactone but due to drop in blood pressures these medications had to be discontinued.  Ventricular fibrillation arrest status post subcutaneous ICD that is followed by EP.  She continues to do remote checks every Monday as well as face-to-face EP visits.  Has had no recurrence of ventricular fibrillation.  Coronary artery disease/spontaneous coronary artery dissection status post V-fib arrest in the setting of scad/ostial LAD in 2021 complicated by anoxic brain injury.  She is continued on carvedilol and atorvastatin 40 mg daily.  She is being sent for CBC, BMP, and lipid panel today as the last time she had had blood done 02/12/2021.  She also is requesting a refill of her atorvastatin 40 mg daily.  EKG today reveals sinus rhythm with a rate of 75 with no ischemic changes noted.  No further ischemic workup planned.  Hyperlipidemia where she remains on atorvastatin 40 mg daily.  Her last LDL was 55 in 2023.  She has been sent for updated blood work today.  Anoxic brain injury followed by Dr. Hermelinda Medicus who had previously started the patient on Ritalin.  She has been continued on therapy twice daily.        Dispo: Patient to return to clinic to see Dr. Okey Dupre or APP in 6 months or sooner if needed  Signed, Aaron Boeh, NP

## 2022-11-07 NOTE — Patient Instructions (Signed)
Medication Instructions:  Your physician has recommended you make the following change in your medication:   atorvastatin (LIPITOR) 40 MG tablet - Take 1 tablet (40 mg total) by mouth daily  carvedilol (COREG) 3.125 MG tablet - Take 1 tablet (3.125 mg total) by mouth 2 (two) times daily  *If you need a refill on your cardiac medications before your next appointment, please call your pharmacy*  Lab Work: Your provider would like for you to have following labs drawn today CBC, BMET, Lipid panel.   If you have labs (blood work) drawn today and your tests are completely normal, you will receive your results only by: MyChart Message (if you have MyChart) OR A paper copy in the mail If you have any lab test that is abnormal or we need to change your treatment, we will call you to review the results.  Testing/Procedures: -None ordered  Follow-Up: At [facility], you and your health needs are our priority.  As part of our continuing mission to provide you with exceptional heart care, we have created designated Provider Care Teams.  These Care Teams include your primary Cardiologist (physician) and Advanced Practice Providers (APPs -  Physician Assistants and Nurse Practitioners) who all work together to provide you with the care you need, when you need it.  Your next appointment:   6 month(s)  Provider:   Yvonne Kendall, MD or Charlsie Quest, NP    Other Instructions -None

## 2022-11-08 LAB — LIPID PANEL
Chol/HDL Ratio: 3.1 {ratio} (ref 0.0–4.4)
Cholesterol, Total: 127 mg/dL (ref 100–199)
HDL: 41 mg/dL (ref >39)
LDL Chol Calc (NIH): 65 mg/dL (ref 0–99)
Triglycerides: 113 mg/dL (ref 0–149)
VLDL Cholesterol Cal: 21 mg/dL (ref 5–40)

## 2022-11-08 LAB — CBC
Hematocrit: 39.3 % (ref 34.0–46.6)
Hemoglobin: 12.8 g/dL (ref 11.1–15.9)
MCH: 30 pg (ref 26.6–33.0)
MCHC: 32.6 g/dL (ref 31.5–35.7)
MCV: 92 fL (ref 79–97)
Platelets: 338 10*3/uL (ref 150–450)
RBC: 4.27 x10E6/uL (ref 3.77–5.28)
RDW: 13.3 % (ref 11.7–15.4)
WBC: 5.6 10*3/uL (ref 3.4–10.8)

## 2022-11-08 LAB — BASIC METABOLIC PANEL
BUN/Creatinine Ratio: 25 — ABNORMAL HIGH (ref 9–23)
BUN: 13 mg/dL (ref 6–20)
CO2: 26 mmol/L (ref 20–29)
Calcium: 9.5 mg/dL (ref 8.7–10.2)
Chloride: 98 mmol/L (ref 96–106)
Creatinine, Ser: 0.53 mg/dL — ABNORMAL LOW (ref 0.57–1.00)
Glucose: 100 mg/dL — ABNORMAL HIGH (ref 70–99)
Potassium: 3.8 mmol/L (ref 3.5–5.2)
Sodium: 136 mmol/L (ref 134–144)
eGFR: 124 mL/min/{1.73_m2} (ref >59)

## 2022-11-08 NOTE — Progress Notes (Signed)
All labs remained stable.  Cholesterol remains at goal.  Blood counts are unchanged and no elevated white counts indicative of infection.  Continue with current medication regimen without changes at this time.

## 2022-11-22 ENCOUNTER — Encounter: Payer: Self-pay | Admitting: Physical Medicine & Rehabilitation

## 2022-11-22 ENCOUNTER — Encounter: Payer: 59 | Attending: Physical Medicine & Rehabilitation | Admitting: Physical Medicine & Rehabilitation

## 2022-11-22 VITALS — BP 104/68 | HR 67 | Ht 69.0 in | Wt 181.0 lb

## 2022-11-22 DIAGNOSIS — R1312 Dysphagia, oropharyngeal phase: Secondary | ICD-10-CM | POA: Diagnosis present

## 2022-11-22 DIAGNOSIS — G931 Anoxic brain damage, not elsewhere classified: Secondary | ICD-10-CM

## 2022-11-22 MED ORDER — AMPHETAMINE-DEXTROAMPHETAMINE 10 MG PO TABS: 10.0000 mg | ORAL_TABLET | Freq: Every day | ORAL | 0 refills | Status: DC

## 2022-11-22 NOTE — Patient Instructions (Signed)
ALWAYS FEEL FREE TO CALL OUR OFFICE WITH ANY PROBLEMS OR QUESTIONS ([phone])  **PLEASE NOTE** ALL MEDICATION REFILL REQUESTS (INCLUDING CONTROLLED SUBSTANCES) NEED TO BE MADE AT LEAST 7 DAYS PRIOR TO REFILL BEING DUE. ANY REFILL REQUESTS INSIDE THAT TIME FRAME MAY RESULT IN DELAYS IN RECEIVING YOUR PRESCRIPTION.                    

## 2022-11-22 NOTE — Progress Notes (Signed)
Subjective:    Patient ID: Beverly [name], female    DOB: 1985-05-24, 37 y.o.   MRN: [id number]  HPI  Deaven is here in follow up of her ABI. She just finished HH PT but SLP is still coming out to house. She remains in communication classes in [place]. Mom thinks that she has had a good relationship with the SLP and is engaging more with her.  Mom notices her talking more with a therapist and even was citing the Pledge of Allegiance the other day.  Over the last week or so she has been eating more as well.  Ritalin at the 20mg  dose seems to be making her jumpy, and "look crazy" per mom. Smaller doses do not seem to make much of a difference at this point.  Mom had tried taking her off the medication and felt that she looked better.    Pain Inventory Average Pain 0 Pain Right Now 0  BOWEL Number of stools per week: 7 Incontinent Yes   BLADDER Pads Bladder incontinence Yes   Mobility walk with assistance use a walker how many minutes can you walk? 5 ability to climb steps?  yes do you drive?  no use a wheelchair needs help with transfers  Function disabled: date disabled 8/21 I need assistance with the following:  feeding, dressing, bathing, toileting, meal prep, household duties, and shopping  Neuro/Psych weakness tremor spasms  Prior Studies Any changes since last visit?  no  Physicians involved in your care Any changes since last visit?  no   Family History  Problem Relation Age of Onset   Hyperlipidemia Mother    Hypertension Mother    Diabetes Maternal Grandmother    Seizures Maternal Grandmother    Thyroid disease Maternal Grandmother    Diabetes Paternal Grandmother    Rashes / Skin problems Son        ezcema   Seizures Son    Social History   Socioeconomic History   Marital status: Single    Spouse name: Not on file   Number of children: 3   Years of education: Not on file   Highest education level: Not on file  Occupational History    Occupation: oncology    Employer: [employer]    Comment: NA  Tobacco Use   Smoking status: Never   Smokeless tobacco: Never  Vaping Use   Vaping status: Never Used  Substance and Sexual Activity   Alcohol use: Not Currently    Alcohol/week: 0.0 standard drinks of alcohol    Comment: quit 2017   Drug use: No    Frequency: 5.0 times per week    Types: Marijuana    Comment: stopped when she found out she was pregnant   Sexual activity: Not Currently    Partners: Male    Birth control/protection: None  Other Topics Concern   Not on file  Social History Narrative   01/06/20 lives with mom and her children   Social Determinants of Health   Financial Resource Strain: Not on file  Food Insecurity: Not on file  Transportation Needs: Not on file  Physical Activity: Not on file  Stress: Not on file  Social Connections: Not on file   Past Surgical History:  Procedure Laterality Date   CARDIAC CATHETERIZATION     IR GASTROSTOMY TUBE MOD SED  10/07/2019   IR GASTROSTOMY TUBE REMOVAL  07/02/2020   IR REPLC GASTRO/COLONIC TUBE PERCUT W/FLUORO  11/03/2021   LEFT HEART CATH  AND CORONARY ANGIOGRAPHY N/A 09/23/2019   Procedure: LEFT HEART CATH AND CORONARY ANGIOGRAPHY;  Surgeon: Yvonne Kendall, MD;  Location: [facility] INVASIVE CV LAB;  Service: Cardiovascular;  Laterality: N/A;   SUBQ ICD IMPLANT N/A 11/26/2020   Procedure: SUBQ ICD IMPLANT;  Surgeon: Lanier Prude, MD;  Location: [facility] INVASIVE CV LAB;  Service: Cardiovascular;  Laterality: N/A;   Past Medical History:  Diagnosis Date   Brain injury (HCC)    CAD (coronary artery disease)    HLD (hyperlipidemia)    Hypertension    last pregnancy   Ischemic cardiomyopathy    STEMI (ST elevation myocardial infarction) (HCC) 09/2019   SCAD with aneurysmal dilation of proximal LAD.   Vaginal Pap smear, abnormal    when she was 37yo   Ventricular fibrillation (HCC) 09/23/2019   BP 104/68   Pulse 67   Ht 5\' 9"  (1.753 m)   Wt 181 lb  (82.1 kg) Comment: reported  SpO2 97%   BMI 26.73 kg/m   Opioid Risk Score:   Fall Risk Score:  `1  Depression screen PHQ 2/9     11/22/2022   11:00 AM 03/22/2022    2:07 PM 10/05/2021    3:35 PM 08/03/2021    3:15 PM 06/01/2021    3:14 PM 03/23/2021    3:03 PM 02/02/2021    2:22 PM  Depression screen PHQ 2/9  Decreased Interest 0 1 0 0 0 0 0  Down, Depressed, Hopeless 0 1 0 0 0 0 0  PHQ - 2 Score 0 2 0 0 0 0 0    Review of Systems  Constitutional: Negative.   HENT: Negative.    Eyes: Negative.   Respiratory: Negative.    Cardiovascular: Negative.   Gastrointestinal:        Incontinent  Endocrine: Negative.   Genitourinary:        Incontinent  Musculoskeletal:  Positive for gait problem.       Spasms  Skin: Negative.   Allergic/Immunologic: Negative.   Neurological:  Positive for tremors and weakness.  Hematological: Negative.   Psychiatric/Behavioral: Negative.    All other systems reviewed and are negative.      Objective:   Physical Exam  General: No acute distress HEENT: NCAT, EOMI, oral membranes moist Cards: reg rate  Chest: normal effort Abdomen: Soft, NT, ND Skin: dry, intact Extremities: no edema Psych: pleasant and appropriate  Skin: Warm and dry.  Intact.    Musc: No edema in extremities.  No tenderness in extremities. Neuro: She is engaging more today. Answered questions more promptly and with less cueing.  Still apraxic and puffs cheeks at times when filling with saliva   Assessment & Plan:  1. Anoxic BI - substantial attention, initiation deficits as well as memory deficits              MRI/CTs unremarkable             -Recent ICD placement.           -Stop Ritalin.  Begin trial of Adderall IR 10 mg daily #30.  If this proves to not be very helpful then we will hold off any further stimulants. -continue sinemet to 25/100  tid. could consider a slight further titration of this Made a referral to [facility] speech therapy for her ongoing language  and swallowing recovery.  She can continue with home health therapies also. 2. Dysphagia:             -ongoing oral  motor apraxia and coordination. Seems to be closer to initiating.               -Cont PEG as a back up for now             -[facility] SLP for cognition and swallowing . -NO E-STIM,  -[facility] speech therapy as above               3. Gait abnormality             Cont ambulation with assistance               RW for balance, sometimes hand held assist only             -Outpatient neurorehab referral at some point. 3. Sleep disturbance: improved             Melatonin 3 mg daily --can take with trazodone            -trazodone at night time scheduled.  this has helped her sleep/wake quite a bit              -More alert during the day per mom   20 minutes of face to face patient care time were spent during this visit. All questions were encouraged and answered.  Follow up with me in 4 mos .

## 2022-11-27 ENCOUNTER — Telehealth: Payer: Self-pay

## 2022-11-27 NOTE — Telephone Encounter (Signed)
Okay given for Speech Therapy once a week for 9 weeks. Clydie Braun with [facility], ph [phone]).

## 2022-12-14 ENCOUNTER — Ambulatory Visit: Payer: 59

## 2022-12-28 ENCOUNTER — Other Ambulatory Visit: Payer: Self-pay | Admitting: Physical Medicine & Rehabilitation

## 2022-12-29 MED ORDER — AMPHETAMINE-DEXTROAMPHETAMINE 10 MG PO TABS: 10.0000 mg | ORAL_TABLET | Freq: Every day | ORAL | 0 refills | Status: DC

## 2022-12-31 ENCOUNTER — Other Ambulatory Visit: Payer: Self-pay | Admitting: Physical Medicine & Rehabilitation

## 2023-01-06 LAB — CUP PACEART REMOTE DEVICE CHECK
Battery Remaining Percentage: 72 %
Date Time Interrogation Session: 20241129170600
HighPow Impedance: 140 Ohm
Implantable Pulse Generator Implant Date: 20221021
Pulse Gen Serial Number: 161805

## 2023-02-05 ENCOUNTER — Other Ambulatory Visit: Payer: Self-pay | Admitting: Physical Medicine & Rehabilitation

## 2023-02-08 MED ORDER — AMPHETAMINE-DEXTROAMPHETAMINE 10 MG PO TABS: 10.0000 mg | ORAL_TABLET | Freq: Every day | ORAL | 0 refills | Status: DC

## 2023-03-02 ENCOUNTER — Emergency Department
Admission: EM | Admit: 2023-03-02 | Discharge: 2023-03-03 | Disposition: A | Discharge status: Home / Self Care | Payer: 59 | Attending: Emergency Medicine | Admitting: Emergency Medicine

## 2023-03-02 ENCOUNTER — Other Ambulatory Visit: Payer: Self-pay | Admitting: Physical Medicine & Rehabilitation

## 2023-03-02 ENCOUNTER — Other Ambulatory Visit: Payer: Self-pay

## 2023-03-02 DIAGNOSIS — T85528A Displacement of other gastrointestinal prosthetic devices, implants and grafts, initial encounter: Secondary | ICD-10-CM

## 2023-03-02 DIAGNOSIS — K9423 Gastrostomy malfunction: Secondary | ICD-10-CM | POA: Diagnosis present

## 2023-03-02 DIAGNOSIS — Z431 Encounter for attention to gastrostomy: Secondary | ICD-10-CM | POA: Diagnosis present

## 2023-03-02 NOTE — ED Provider Notes (Signed)
[facility] Provider Note    Event Date/Time   First MD Initiated Contact with Patient 03/02/23 2311     (approximate)   History   GI Problem   HPI Beverly [name] is a 38 y.o. female with history of prior cardiac arrest leading to anoxic brain injury and G-tube placement about 3 years ago.  She presents tonight after dislodgment of the G-tube at home.  Her mother is with her at bedside and states that she was sitting on her lap and hugging her when she moved up against her and accidentally pulled the tube out.  Patient is in no distress and denies being in any pain.  Tube has been in place for years and should have a well-established tract.     Physical Exam   Triage Vital Signs: ED Triage Vitals  Encounter Vitals Group     BP 03/02/23 1940 108/68     Systolic BP Percentile --      Diastolic BP Percentile --      Pulse Rate 03/02/23 1940 65     Resp 03/02/23 1940 15     Temp 03/02/23 1940 97.9 F (36.6 C)     Temp Source 03/02/23 1940 Oral     SpO2 03/02/23 1940 98 %     Weight 03/02/23 1939 75.3 kg (166 lb)     Height --      Head Circumference --      Peak Flow --      Pain Score 03/02/23 1939 0     Pain Loc --      Pain Education --      Exclude from Growth Chart --     Most recent vital signs: Vitals:   03/02/23 1940  BP: 108/68  Pulse: 65  Resp: 15  Temp: 97.9 F (36.6 C)  SpO2: 98%    General: Awake, no distress.  At her baseline after her hypoxic brain injury. CV:  Good peripheral perfusion.  Regular rate and rhythm. Resp:  Normal effort. no accessory muscle usage nor intercostal retractions.   Abd:  No distention.  No tenderness to palpation.  Well-established gastrostomy stoma.   ED Results / Procedures / Treatments   Labs (all labs ordered are listed, but only abnormal results are displayed) Labs Reviewed - No data to display   RADIOLOGY Abdominal x-ray, see hospital course for  details   PROCEDURES:  Critical Care performed: No  .Gastrostomy tube replacement  Date/Time: 03/03/2023 1:45 AM  Performed by: Loleta Rose, MD Authorized by: Loleta Rose, MD  Consent: Verbal consent obtained. Risks and benefits: risks, benefits and alternatives were discussed Consent given by: parent Required items: required blood products, implants, devices, and special equipment available Patient identity confirmed: arm band Local anesthesia used: no  Anesthesia: Local anesthesia used: no  Sedation: Patient sedated: no  Patient tolerance: patient tolerated the procedure well with no immediate complications Comments: Downsized from 20-Fr to 16-Fr -- see hospital course for details       IMPRESSION / MDM / ASSESSMENT AND PLAN / ED COURSE  I reviewed the triage vital signs and the nursing notes.                              Differential diagnosis includes, but is not limited to, G-tube malfunction, stoma injury.  Patient's presentation is most consistent with acute complicated illness / injury requiring diagnostic workup.  Labs/studies ordered:  Abdominal x-ray to verify G-tube placement  Interventions/Medications given:  Medications  diatrizoate meglumine-sodium (GASTROGRAFIN) 66-10 % solution 30 mL (30 mLs Per Tube Given 03/03/23 0133)    (Note:  hospital course my include additional interventions and/or labs/studies not listed above.)   Patient brought her dislodged 20-French G-tube with her to the emergency department so that I could verify the size.  Does not appear to be in pain and she has no other complications.  We are attempting to track down a 20 [place] G-tube from OR or other location in the hospital and I will attempt placement.  Should be successful given that it is a well-established tract.  Mother and patient agree with plan.   Clinical Course as of 03/03/23 0222  Sat Mar 03, 2023  0110 Placed replacement G-tube, unfortunately the stoma has  started to close and I was unable to pass a 42 [place] or 82 [place].  I was, however, able to safely insert a 16 [place].  Awaiting radiology confirmation [CF]  0219 DG Abdomen 1 View I viewed and interpreted the patient's x-ray and the tube appears to be placed appropriately.  The radiologist report confirmed good placement of gastrostomy tube.  Updated patient and mother, they will follow-up as an outpatient. [CF]    Clinical Course User Index [CF] Loleta Rose, MD     FINAL CLINICAL IMPRESSION(S) / ED DIAGNOSES   Final diagnoses:  Dislodged gastrostomy tube     Rx / DC Orders   ED Discharge Orders     None        Note:  This document was prepared using Dragon voice recognition software and may include unintentional dictation errors.   Loleta Rose, MD 03/03/23 Earle Gell

## 2023-03-02 NOTE — ED Triage Notes (Signed)
[name] states that pt's feeding tube became dislodged at approx 1850 while "horseplaying". She brought tube for sizing. Pt denies pain. Area is covered with clean dressing.

## 2023-03-02 NOTE — ED Notes (Signed)
Writer RN contacts OR for 36fr follow up per OrR51fr g tube is not located @ this time

## 2023-03-02 NOTE — ED Notes (Signed)
Writer RN attempts to locate 36fr G tube per Ed physician request none available in ed Physiological scientist contacts OR awaits notification of OR possibly having 46fr G tube available

## 2023-03-03 ENCOUNTER — Emergency Department: Payer: 59

## 2023-03-03 ENCOUNTER — Emergency Department
Admission: EM | Admit: 2023-03-03 | Discharge: 2023-03-03 | Disposition: A | Discharge status: Home / Self Care | Payer: 59 | Attending: Emergency Medicine | Admitting: Emergency Medicine

## 2023-03-03 ENCOUNTER — Other Ambulatory Visit: Payer: Self-pay

## 2023-03-03 DIAGNOSIS — Z4659 Encounter for fitting and adjustment of other gastrointestinal appliance and device: Secondary | ICD-10-CM

## 2023-03-03 DIAGNOSIS — Z431 Encounter for attention to gastrostomy: Secondary | ICD-10-CM | POA: Insufficient documentation

## 2023-03-03 DIAGNOSIS — K9423 Gastrostomy malfunction: Secondary | ICD-10-CM | POA: Diagnosis not present

## 2023-03-03 MED ORDER — DIATRIZOATE MEGLUMINE & SODIUM 66-10 % PO SOLN
30.0000 mL | Freq: Once | ORAL | Status: AC
  Administered 2023-03-03: 30 mL

## 2023-03-03 NOTE — ED Notes (Signed)
Ed physician provided 60fr G tube for replacement pt tolerated well sterile water flushed balloon inflated per ED physician gastric content return noted

## 2023-03-03 NOTE — ED Provider Notes (Signed)
   [facility] Provider Note    Event Date/Time   First MD Initiated Contact with Patient 03/03/23 1307     (approximate)   History   No chief complaint on file.   HPI  Beverly [name] is a 38 y.o. female presents emergency department with mother.  Patient had a feeding tube changed out yesterday and it is a different size than her normal.  Just needs supplies for her feeding tube      Physical Exam   Triage Vital Signs: ED Triage Vitals [03/03/23 1306]  Encounter Vitals Group     BP      Systolic BP Percentile      Diastolic BP Percentile      Pulse      Resp      Temp      Temp src      SpO2      Weight 160 lb (72.6 kg)     Height 5\' 9"  (1.753 m)     Head Circumference      Peak Flow      Pain Score 0     Pain Loc      Pain Education      Exclude from Growth Chart     Most recent vital signs: Vitals:   03/03/23 1315  BP: 108/66  Pulse: 66  Resp: 17  Temp: 98.6 F (37 C)  SpO2: 90%     General: Awake, no distress.   CV:  Good peripheral perfusion. regular rate and  rhythm Resp:  Normal effort.  Abd:  No distention.   Other:      ED Results / Procedures / Treatments   Labs (all labs ordered are listed, but only abnormal results are displayed) Labs Reviewed - No data to display   EKG     RADIOLOGY     PROCEDURES:   Procedures No chief complaint on file.     MEDICATIONS ORDERED IN ED: Medications - No data to display   IMPRESSION / MDM / ASSESSMENT AND PLAN / ED COURSE  I reviewed the triage vital signs and the nursing notes.                              Differential diagnosis includes, but is not limited to, encounter for feeding tube care, feeding tube complication, medication refill  Patient's presentation is most consistent with acute, uncomplicated illness.   The patient basically needed supplies for her new feeding tube as her old supplies did not fit.  Nursing staff to obtain the  supplies.  Patient was discharged stable condition.      FINAL CLINICAL IMPRESSION(S) / ED DIAGNOSES   Final diagnoses:  Encounter for care related to feeding tube     Rx / DC Orders   ED Discharge Orders     None        Note:  This document was prepared using Dragon voice recognition software and may include unintentional dictation errors.    Faythe Ghee, PA-C 03/03/23 1516    Janith Lima, MD 03/03/23 [phone]

## 2023-03-03 NOTE — ED Notes (Signed)
Pt arrives c/c 75fr G-tube dislodged requires replacement 72fr g-tube located and site noted to be partially closed unable to pass 69fr G-tube alternatively 56fr G-tube placed and line confirmed through imaging on site pt appears in nad is pleasant calm provided blanket for comfort pt mother @ bs dressing supplies provided for home care pt mother educated on s/s to return to ed and post placement POC to have PCP provide referral for IR to complete 69fr G-tube for pt continued health maintenance

## 2023-03-03 NOTE — ED Notes (Signed)
Provided pt's mother with a PEG tube syringe reports she will F/U with GI for more supplies

## 2023-03-03 NOTE — ED Triage Notes (Signed)
[name] reports PEG tube was changed yesterday. Mother needs supplies.

## 2023-03-03 NOTE — Discharge Instructions (Signed)
We were able to replace your G-tube, but we had to go down in size in order to replace the tube in the emergency department (you had a 20 French tube before, and we had to go down to a 16 [place] tube).  This may require crushing her medications smaller and administering your medications and feeding solution at a slower rate.  We recommend you follow-up with your regular doctor and discuss whether they should order an interventional radiology procedure for you to "up-size" the gastrostomy stoma back to the larger size.  You may continue taking your medications as previously prescribed.  Return to the emergency department if you develop new or worsening symptoms that concern you.

## 2023-03-05 ENCOUNTER — Other Ambulatory Visit: Payer: Self-pay | Admitting: Interventional Radiology

## 2023-03-05 DIAGNOSIS — Z431 Encounter for attention to gastrostomy: Secondary | ICD-10-CM

## 2023-03-08 NOTE — Progress Notes (Signed)
Patient for IR Gastrostomy Tube replacement on Friday 03/09/2023, I called and spoke with the patient's mother, [name] on the phone and gave pre-procedure instructions. [name] was made aware to have the patient here at 11:30a, NPO after MN prior to procedure as well as driver post procedure/recovery/discharge. [name] stated understanding.  Called 03/08/2023

## 2023-03-09 ENCOUNTER — Other Ambulatory Visit: Payer: Self-pay | Admitting: Interventional Radiology

## 2023-03-09 ENCOUNTER — Ambulatory Visit
Admission: RE | Admit: 2023-03-09 | Discharge: 2023-03-09 | Disposition: A | Discharge status: Home / Self Care | Payer: 59 | Source: Ambulatory Visit | Attending: Interventional Radiology | Admitting: Interventional Radiology

## 2023-03-09 DIAGNOSIS — Z431 Encounter for attention to gastrostomy: Secondary | ICD-10-CM | POA: Diagnosis present

## 2023-03-09 HISTORY — PX: IR REPLC GASTRO/COLONIC TUBE PERCUT W/FLUORO: IMG2333

## 2023-03-09 MED ORDER — LIDOCAINE VISCOUS HCL 2 % MT SOLN
2.0000 mL | Freq: Once | OROMUCOSAL | Status: AC
  Administered 2023-03-09: 2 mL via TOPICAL

## 2023-03-09 MED ORDER — LIDOCAINE VISCOUS HCL 2 % MT SOLN
OROMUCOSAL | Status: AC
  Filled 2023-03-09: qty 15

## 2023-03-15 ENCOUNTER — Ambulatory Visit (INDEPENDENT_AMBULATORY_CARE_PROVIDER_SITE_OTHER): Payer: 59

## 2023-03-15 DIAGNOSIS — I255 Ischemic cardiomyopathy: Secondary | ICD-10-CM

## 2023-03-15 LAB — CUP PACEART REMOTE DEVICE CHECK
Battery Remaining Percentage: 70 %
Date Time Interrogation Session: 20250206070600
HighPow Impedance: 140 Ohm
Implantable Pulse Generator Implant Date: 20221021
Pulse Gen Serial Number: 161805

## 2023-03-28 ENCOUNTER — Encounter: Payer: 59 | Admitting: Physical Medicine & Rehabilitation

## 2023-04-02 ENCOUNTER — Other Ambulatory Visit: Payer: Self-pay

## 2023-04-02 MED ORDER — AMPHETAMINE-DEXTROAMPHETAMINE 10 MG PO TABS: 10.0000 mg | ORAL_TABLET | Freq: Every day | ORAL | 0 refills | Status: DC

## 2023-04-02 NOTE — Telephone Encounter (Signed)
Refill request on Adderall. 

## 2023-04-18 NOTE — Progress Notes (Signed)
 Remote ICD transmission.

## 2023-05-08 ENCOUNTER — Ambulatory Visit: Payer: 59 | Admitting: Cardiology

## 2023-05-11 ENCOUNTER — Other Ambulatory Visit: Payer: Self-pay | Admitting: Physical Medicine & Rehabilitation

## 2023-05-14 ENCOUNTER — Other Ambulatory Visit: Payer: Self-pay | Admitting: Physical Medicine & Rehabilitation

## 2023-05-14 MED ORDER — AMPHETAMINE-DEXTROAMPHETAMINE 10 MG PO TABS: 10.0000 mg | ORAL_TABLET | Freq: Every day | ORAL | 0 refills | Status: DC

## 2023-05-23 ENCOUNTER — Encounter: Payer: Self-pay | Admitting: Physical Medicine & Rehabilitation

## 2023-05-23 ENCOUNTER — Encounter: Payer: 59 | Attending: Physical Medicine & Rehabilitation | Admitting: Physical Medicine & Rehabilitation

## 2023-05-23 ENCOUNTER — Other Ambulatory Visit: Payer: Self-pay | Admitting: Physical Medicine & Rehabilitation

## 2023-05-23 VITALS — BP 94/65 | HR 71 | Ht 69.0 in | Wt 196.6 lb

## 2023-05-23 DIAGNOSIS — R1312 Dysphagia, oropharyngeal phase: Secondary | ICD-10-CM

## 2023-05-23 DIAGNOSIS — K117 Disturbances of salivary secretion: Secondary | ICD-10-CM

## 2023-05-23 DIAGNOSIS — G931 Anoxic brain damage, not elsewhere classified: Secondary | ICD-10-CM | POA: Diagnosis present

## 2023-05-23 DIAGNOSIS — R482 Apraxia: Secondary | ICD-10-CM | POA: Diagnosis not present

## 2023-05-23 MED ORDER — GLYCOPYRROLATE 1 MG PO TABS: 1.0000 mg | ORAL_TABLET | Freq: Two times a day (BID) | ORAL | 5 refills | Status: DC

## 2023-05-23 NOTE — Patient Instructions (Signed)
 ALWAYS FEEL FREE TO CALL OUR OFFICE WITH ANY PROBLEMS OR QUESTIONS [phone])  **PLEASE NOTE** ALL MEDICATION REFILL REQUESTS (INCLUDING CONTROLLED SUBSTANCES) NEED TO BE MADE AT LEAST 7 DAYS PRIOR TO REFILL BEING DUE. ANY REFILL REQUESTS INSIDE THAT TIME FRAME MAY RESULT IN DELAYS IN RECEIVING YOUR PRESCRIPTION.

## 2023-05-23 NOTE — Progress Notes (Signed)
 Subjective:    Patient ID: Beverly [name], female    DOB: 03-26-1985, 38 y.o.   MRN: [id number]  HPI  [name] is here in follow up of her ABI. She had a fall last week when she managed to get up on her own. She suffered an abrasion to her scalp and avoided any other injuries. She is in the w/c primarily.   She continues to work with SLP at [facility] and [facility] working on speech/swallowing but remains inconsistent. She still is largely receiving TF. Mom says she ate one big meal in January , Spaghetti.   She's on adderall for attention and intiation. Mom thinks that she's actually doing better when she misses a dose. She is more alert overall as she's sleeping better. Her initiation can very much wax and wane. She remains sinemet.   Mom asked if there are options to scopolamine patch for her salivation.   Pain Inventory Average Pain 0 Pain Right Now 0  BOWEL Number of stools per week: 7  BLADDER Normal  Mobility walk with assistance use a walker use a wheelchair needs help with transfers  Function disabled: date disabled . I need assistance with the following:  feeding, dressing, bathing, toileting, meal prep, household duties, and shopping  Neuro/Psych trouble walking spasms confusion  Prior Studies Any changes since last visit?  no  Physicians involved in your care Any changes since last visit?  no   Family History  Problem Relation Age of Onset   Hyperlipidemia Mother    Hypertension Mother    Diabetes Maternal Grandmother    Seizures Maternal Grandmother    Thyroid disease Maternal Grandmother    Diabetes Paternal Grandmother    Rashes / Skin problems Son        ezcema   Seizures Son    Social History   Socioeconomic History   Marital status: Single    Spouse name: Not on file   Number of children: 3   Years of education: Not on file   Highest education level: Not on file  Occupational History   Occupation: oncology    Employer: [employer]     Comment: NA  Tobacco Use   Smoking status: Never   Smokeless tobacco: Never  Vaping Use   Vaping status: Never Used  Substance and Sexual Activity   Alcohol use: Not Currently    Alcohol/week: 0.0 standard drinks of alcohol    Comment: quit 2017   Drug use: No    Frequency: 5.0 times per week    Types: Marijuana    Comment: stopped when she found out she was pregnant   Sexual activity: Not Currently    Partners: Male    Birth control/protection: None  Other Topics Concern   Not on file  Social History Narrative   01/06/20 lives with mom and her children   Social Drivers of Corporate investment banker Strain: Not on file  Food Insecurity: Not on file  Transportation Needs: Not on file  Physical Activity: Not on file  Stress: Not on file  Social Connections: Not on file   Past Surgical History:  Procedure Laterality Date   CARDIAC CATHETERIZATION     IR GASTROSTOMY TUBE MOD SED  10/07/2019   IR GASTROSTOMY TUBE REMOVAL  07/02/2020   IR REPLC GASTRO/COLONIC TUBE PERCUT W/FLUORO  11/03/2021   IR REPLC GASTRO/COLONIC TUBE PERCUT W/FLUORO  03/09/2023   LEFT HEART CATH AND CORONARY ANGIOGRAPHY N/A 09/23/2019   Procedure: LEFT HEART  CATH AND CORONARY ANGIOGRAPHY;  Surgeon: Sammy Crisp, MD;  Location: [facility] INVASIVE CV LAB;  Service: Cardiovascular;  Laterality: N/A;   SUBQ ICD IMPLANT N/A 11/26/2020   Procedure: SUBQ ICD IMPLANT;  Surgeon: Boyce Byes, MD;  Location: [facility] INVASIVE CV LAB;  Service: Cardiovascular;  Laterality: N/A;   Past Medical History:  Diagnosis Date   Brain injury (HCC)    CAD (coronary artery disease)    HLD (hyperlipidemia)    Hypertension    last pregnancy   Ischemic cardiomyopathy    STEMI (ST elevation myocardial infarction) (HCC) 09/2019   SCAD with aneurysmal dilation of proximal LAD.   Vaginal Pap smear, abnormal    when she was 38yo   Ventricular fibrillation (HCC) 09/23/2019   BP 94/65   Pulse 71   Ht 5\' 9"  (1.753 m)   Wt 196  lb 9.6 oz (89.2 kg)   SpO2 91%   BMI 29.03 kg/m   Opioid Risk Score:   Fall Risk Score:  `1  Depression screen [facility] 2/9     05/23/2023   10:46 AM 11/22/2022   11:00 AM 03/22/2022    2:07 PM 10/05/2021    3:35 PM 08/03/2021    3:15 PM 06/01/2021    3:14 PM 03/23/2021    3:03 PM  Depression screen PHQ 2/9  Decreased Interest 0 0 1 0 0 0 0  Down, Depressed, Hopeless 0 0 1 0 0 0 0  PHQ - 2 Score 0 0 2 0 0 0 0     Review of Systems  Musculoskeletal:  Positive for gait problem.       Spasms  Psychiatric/Behavioral:  Positive for confusion.   All other systems reviewed and are negative.      Objective:   Physical Exam  General: No acute distress HEENT: NCAT, EOMI, oral membranes moist Cards: reg rate  Chest: normal effort Abdomen: Soft, NT, ND Skin: dry, intact Extremities: no edema Psych: pleasant and appropriate  Skin: Warm and dry.  Intact.    Musc: No edema in extremities.  No tenderness in extremities. Neuro: engages and follows commands.  Can be cued to respond. Still pockets saliva, puffs cheeks.  Still apraxic    Assessment & Plan:  1. Anoxic BI - substantial attention, initiation deficits as well as memory deficits              MRI/CTs unremarkable             -Recent ICD placement.           -will hold off on any stimulants given equivocal effect at present -continue sinemet to 25/100  tid. could consider a slight further titration of this Continue [employer] speech therapy for her ongoing language and swallowing recovery.   -family needs to continue with reminders. Schedule, routine. 2. Dysphagia:             -ongoing oral motor apraxia and coordination.  -try robinul for scopolamine although the amount of salivation is not really the issue               -Cont PEG as a back up for now             -[facility] SLP for cognition and swallowing . -NO E-STIM,  -[facility] speech therapy as above               3. Gait abnormality             Cont ambulation with  assistance                RW for balance, sometimes hand held assist only             -Outpatient referral made for PT at [facility]. 3. Sleep disturbance: improved             Melatonin 3 mg daily --can take with trazodone            -trazodone at night time scheduled.  this has helped her sleep/wake quite a bit              -More alert during the day overall   20 minutes of face to face patient care time were spent during this visit. All questions were encouraged and answered.  Follow up with me in 4 mos .

## 2023-05-30 ENCOUNTER — Ambulatory Visit: Attending: Physical Medicine & Rehabilitation

## 2023-05-30 DIAGNOSIS — R2689 Other abnormalities of gait and mobility: Secondary | ICD-10-CM | POA: Insufficient documentation

## 2023-05-30 DIAGNOSIS — R278 Other lack of coordination: Secondary | ICD-10-CM | POA: Insufficient documentation

## 2023-05-30 DIAGNOSIS — R2681 Unsteadiness on feet: Secondary | ICD-10-CM | POA: Diagnosis present

## 2023-05-30 DIAGNOSIS — G931 Anoxic brain damage, not elsewhere classified: Secondary | ICD-10-CM | POA: Diagnosis present

## 2023-05-30 DIAGNOSIS — R262 Difficulty in walking, not elsewhere classified: Secondary | ICD-10-CM | POA: Diagnosis present

## 2023-05-30 DIAGNOSIS — M6281 Muscle weakness (generalized): Secondary | ICD-10-CM | POA: Insufficient documentation

## 2023-05-30 DIAGNOSIS — R482 Apraxia: Secondary | ICD-10-CM | POA: Diagnosis not present

## 2023-05-30 DIAGNOSIS — R269 Unspecified abnormalities of gait and mobility: Secondary | ICD-10-CM | POA: Diagnosis present

## 2023-05-30 NOTE — Therapy (Signed)
 OUTPATIENT PHYSICAL THERAPY NEURO EVALUATION   Patient Name: Beverly [name] MRN: [id number] DOB:Jun 26, 1985, 38 y.o., female Today's Date: 05/31/2023   PCP: [facility] REFERRING PROVIDER: Rawland Caddy, MD  END OF SESSION:  PT End of Session - 05/30/23 1130     Visit Number 1    Number of Visits 25    Date for PT Re-Evaluation 08/22/23    PT Start Time 1131    PT Stop Time 1220    PT Time Calculation (min) 49 min    Equipment Utilized During Treatment Gait belt    Activity Tolerance Other (comment)   difficulty attending to task   Behavior During Therapy Impulsive             Past Medical History:  Diagnosis Date   Brain injury (HCC)    CAD (coronary artery disease)    HLD (hyperlipidemia)    Hypertension    last pregnancy   Ischemic cardiomyopathy    STEMI (ST elevation myocardial infarction) (HCC) 09/2019   SCAD with aneurysmal dilation of proximal LAD.   Vaginal Pap smear, abnormal    when she was 38yo   Ventricular fibrillation (HCC) 09/23/2019   Past Surgical History:  Procedure Laterality Date   CARDIAC CATHETERIZATION     IR GASTROSTOMY TUBE MOD SED  10/07/2019   IR GASTROSTOMY TUBE REMOVAL  07/02/2020   IR REPLC GASTRO/COLONIC TUBE PERCUT W/FLUORO  11/03/2021   IR REPLC GASTRO/COLONIC TUBE PERCUT W/FLUORO  03/09/2023   LEFT HEART CATH AND CORONARY ANGIOGRAPHY N/A 09/23/2019   Procedure: LEFT HEART CATH AND CORONARY ANGIOGRAPHY;  Surgeon: Sammy Crisp, MD;  Location: [facility] INVASIVE CV LAB;  Service: Cardiovascular;  Laterality: N/A;   SUBQ ICD IMPLANT N/A 11/26/2020   Procedure: SUBQ ICD IMPLANT;  Surgeon: Boyce Byes, MD;  Location: [facility] INVASIVE CV LAB;  Service: Cardiovascular;  Laterality: N/A;   Patient Active Problem List   Diagnosis Date Noted   Apraxia 10/05/2021   S/P ICD (internal cardiac defibrillator) procedure 11/27/2020   Hx of Cardiac arrest (HCC) 11/26/2020   Abnormality of gait 03/16/2020   Sleep  disturbance 03/16/2020   Oropharyngeal dysphagia 02/12/2020   Coronary artery disease involving native coronary artery of native heart without angina pectoris 12/12/2019   Ischemic cardiomyopathy 12/12/2019   Brain injury (HCC)    Prediabetes    Anoxic brain injury (HCC) 11/05/2019   Dysphagia 11/05/2019   Physical deconditioning 11/05/2019   Acute delirium 11/05/2019   Respiratory failure (HCC)    Encounter for central line placement    Ventricular fibrillation (HCC) 09/23/2019   Acute combined systolic and diastolic heart failure (HCC) 09/23/2019   Ventricular tachycardia, polymorphic (HCC) 09/23/2019   Pelvic pain affecting pregnancy 10/15/2015   Supervision of normal pregnancy in third trimester 09/28/2015   Poor weight gain of pregnancy 09/28/2015   Iron  deficiency anemia of pregnancy 09/01/2015   Increased BMI (body mass index) 07/29/2015    ONSET DATE: August 2021  REFERRING DIAG:  G93.1 (ICD-10-CM) - Anoxic brain injury (HCC)  R48.2 (ICD-10-CM) - Apraxia    THERAPY DIAG:  Anoxic brain injury (HCC)  Other lack of coordination  Difficulty in walking, not elsewhere classified  Unsteadiness on feet  Muscle weakness (generalized)  Rationale for Evaluation and Treatment: Rehabilitation  SUBJECTIVE:  SUBJECTIVE STATEMENT: Pt known to PT clinic. Previously seen for outpatient services March 2024. Pt was receiving HH PT until October 2024 per her mother's report. [name] reports pt using HHA for ambulation, was using RW but she says was told not to do to difficulty with safe technique. Pt has fallen 1x in the past six months trying to ambulate on her own. Pt requires assistance with ADLs, including bathing and dressing. [name] reports pt never indicates any pain. Pt with severe  aphasia so is unable to answer questions/pt's mom provides hx. She reports pt has impaired short-term memory, difficulty with stair climbing (has stair lift at home). She reports pt's BLE are weaker than they used to be. The pt is probably limited to <10 min with ambulation due to poor endurance.  Pt accompanied by: self, mother   PERTINENT HISTORY:   Pt known to PT clinic with the following hx per previous PT course:   " Patient is a 38 year old with diagnois of anoxic brain injury. She was receiving outpatient PT until Feb. 2022 last year but ran out of visits. Patient has past medical history of Anoxic Brain Injury 09/2019, CAD, HLD, HTN, ISCHEMIC CARDIMYOPATHY, STEMI, V-FIB, ICD on 11/29/2020  .On 09/23/19 patient presented to ED 2 weeks postpartum (had emergent C-Section) with chest pain. In ED sustained VFib Cardiac Arrest requiring intubation. EKG showed ST elevation, Cath Lab showed aneurysmal dissection of the LAD was identified. Course complicated by encephalopathy, anoxic brain injury, enterobacterial PNA. Patient is currently living with mom. Mother reports she is abel to walk some with a walker with supervision but primarily ambulates with HHA of mother who is her guardian. Mother also reports she requires assist for all transfers and ADL's. "  PAIN:  Are you having pain? No  PRECAUTIONS: Fall  WEIGHT BEARING RESTRICTIONS: No  FALLS: Has patient fallen in last 6 months? Yes. Number of falls 1  LIVING ENVIRONMENT: Lives with: lives with their family, mother Lives in: House/apartment Stairs:  has stairs but stair lift installed, pt using lift Has following equipment at home: Environmental consultant - 2 wheeled, Wheelchair (manual), shower chair, and Grab bars, pt uses stair lift  PLOF: Needs assistance with ADLs  PATIENT GOALS: balance, walking, and pt states basketball  OBJECTIVE:  Note: Objective measures were completed at Evaluation unless otherwise noted.  DIAGNOSTIC FINDINGS:  None  recent in chart  COGNITION: Overall cognitive status: impaired   SENSATION: Unable to complete testing due to impaired cognition, pt unable to report if sensation impairment present, pt mother unsure   COORDINATION: Pt unable to follow cues for testing  EDEMA:  Mom reports pt's feet do swell from time to time   Convergence: Impaired L eye  MUSCLE TONE: impaired bilat LE   POSTURE: difficulty assessing due to pt ataxic movement, typically observed to sacral sit in session   LOWER EXTREMITY MMT:  *accuracy of testing likely impacted by pt difficulty following cues  MMT Right Eval Left Eval  Hip flexion 4+ 4+  Hip extension    Hip abduction 4+ 4+  Hip adduction 4+ 4+  Hip internal rotation    Hip external rotation    Knee flexion 4+ 4+  Knee extension 5 5  Ankle dorsiflexion    Ankle plantarflexion    Ankle inversion    Ankle eversion    (Blank rows = not tested)   TRANSFERS: HHA  tried in session, very unsteady, pt has difficulty with motor planning, little success  with cues due to distraction, very unsafe Attempted in session with RW, pt unsteady, gait ataxic, continued significant difficulty with motor planning requiring up to min a + 2 and multi-modal cues to safely sit in chair.    STAIRS: Impaired per pt's mother GAIT: ataxic   FUNCTIONAL TESTS:  5 times sit to stand: 1 min 43 sec Timed up and go (TUG): 1 min 30 sec with RW 10 meter walk test: 0.25 m/s with RW extensive cuing to complete activity  PATIENT SURVEYS:  SIS-16 filled out by pt's mother, used as questions relevant to pt: 45                                                                                                                              TREATMENT DATE: 05/30/2023   Self-Care/Home management: PT reviewed assessment findings of tests with pt and pt's mother, plan, goals, and prognosis. Instruction also in safe use of AD/RW and transfer technique.   PATIENT EDUCATION: Education  details: assessment findings, goals, plan, prognosis, transfer technique, safe use of AD Person educated: Patient and Parent Education method: Explanation, Demonstration, Tactile cues, and Verbal cues Education comprehension: returned demonstration, verbal cues required, tactile cues required, and needs further education  HOME EXERCISE PROGRAM: To be initiated next 1-2 visits   GOALS: Goals reviewed with patient? Yes   SHORT TERM GOALS: Target date: 07/11/2023    Patient will be independent in home exercise program to improve strength/mobility for better functional independence with ADLs. Baseline: Goal status: INITIAL   LONG TERM GOALS: Target date: 08/22/2023     1.  Patient will complete five times sit to stand test in < 35 seconds indicating an increased LE strength and improved balance. Baseline: previously 37 sec with UUE 04/19/22 today 05/30/23: 1 min 43 seconds Goal status: INITIAL  2. Patient will demo ability to ambulate > 400 ft w/ LRAD versus hand held assist min assist to demonstrate improved household mobility and short community distances.  Baseline: deferred Goal status: INITIAL  3.  Patient will increase 10 meter walk test to >1.64m/s as to improve gait speed for better community ambulation and to reduce fall risk. Baseline: previously 0.36 m/s on 04/19/22 : 0.25 m/s Goal status: INITIAL  4.  Patient will reduce timed up and go to <50 seconds to reduce fall risk and demonstrate improved transfer/gait ability Baseline: previously 91 sec with RW on 04/19/22; 05/30/23: 1 min 30 sec with RW  Goal status: INITIAL  5. Pt will exhibit correct and safe technique with stand>sit into a standard chair or transport chair to reduce risk of falling.   Baseline: pt current technique unsafe, poor motor planning  Goal status: INITIAL  6. The pt will demonstrate at least a 15 point improvement on SIS-16 to indicate increased ease with ADLs.  Baseline: 45 (filled out by pt's  mother)  Goal status: INITIAL    ASSESSMENT:  CLINICAL IMPRESSION: Pt is a pleasant  38 y/o female seen today for PT eval and treatment for impairments due to anoxic brain injury.  [name] must provide history due to pt having severe aphasia. Exam reveals impairments of gait (ataxia), decreased balance, increased fall risk, and impaired strength and mobility. Pt also found to have significant difficulty with motor planning and struggled to follow PT cues in busy clinic environment. The pt will benefit from further skilled PT to improve impairments in order to decrease fall risk and increase safety with mobility and ADLs.   OBJECTIVE IMPAIRMENTS: Abnormal gait, decreased activity tolerance, decreased balance, decreased coordination, decreased endurance, decreased knowledge of use of DME, decreased mobility, difficulty walking, decreased strength, decreased safety awareness, increased edema, impaired tone, improper body mechanics, and postural dysfunction.   ACTIVITY LIMITATIONS: carrying, lifting, bending, standing, squatting, stairs, transfers, bathing, dressing, locomotion level, and caring for others  PARTICIPATION LIMITATIONS: meal prep, cleaning, laundry, medication management, personal finances, driving, shopping, community activity, occupation, and yard work  PERSONAL FACTORS: Sex, Time since onset of injury/illness/exacerbation, and 1-2 comorbidities: per chart PMH includes CAD, HTN, ischemic cardiomyopathy, STEMI, ventricular fibrillation, dysphagia, s/p ICD, apraxia   are also affecting patient's functional outcome.   REHAB POTENTIAL: Fair    CLINICAL DECISION MAKING: Evolving/moderate complexity  EVALUATION COMPLEXITY: High  PLAN:  PT FREQUENCY: 1-2x/week  PT DURATION: 12 weeks  PLANNED INTERVENTIONS: 97164- PT Re-evaluation, 97750- Physical Performance Testing, 97110-Therapeutic exercises, 97530- Therapeutic activity, 97112- Neuromuscular re-education, 97535- Self Care,  97140- Manual therapy, [phone]- Gait training, 13244- Orthotic Initial, [phone]- Orthotic/Prosthetic subsequent, [phone]- Canalith repositioning, Patient/Family education, Balance training, Stair training, Taping, Joint mobilization, Spinal mobilization, Vestibular training, DME instructions, Wheelchair mobility training, Cryotherapy, and Moist heat  PLAN FOR NEXT SESSION: test distance pt able to ambulate before requiring rest (goal, see above), initiate HEP   Samie Crews, PT 05/31/2023, 9:21 AM

## 2023-06-04 ENCOUNTER — Ambulatory Visit: Admitting: Physical Therapy

## 2023-06-04 DIAGNOSIS — M6281 Muscle weakness (generalized): Secondary | ICD-10-CM

## 2023-06-04 DIAGNOSIS — R262 Difficulty in walking, not elsewhere classified: Secondary | ICD-10-CM

## 2023-06-04 DIAGNOSIS — R278 Other lack of coordination: Secondary | ICD-10-CM

## 2023-06-04 DIAGNOSIS — R269 Unspecified abnormalities of gait and mobility: Secondary | ICD-10-CM

## 2023-06-04 DIAGNOSIS — R2681 Unsteadiness on feet: Secondary | ICD-10-CM

## 2023-06-04 DIAGNOSIS — R2689 Other abnormalities of gait and mobility: Secondary | ICD-10-CM

## 2023-06-04 DIAGNOSIS — G931 Anoxic brain damage, not elsewhere classified: Secondary | ICD-10-CM | POA: Diagnosis not present

## 2023-06-04 NOTE — Therapy (Signed)
 OUTPATIENT PHYSICAL THERAPY NEURO EVALUATION   Patient Name: Beverly [name] MRN: [id number] DOB:[birth year], 38 y.o., female Today's Date: 06/04/2023   PCP: Center, [facility] REFERRING PROVIDER: Rawland Caddy, MD  END OF SESSION:  PT End of Session - 06/04/23 1148     Visit Number 2    Number of Visits 25    Date for PT Re-Evaluation 08/22/23    PT Start Time 1145    PT Stop Time 1225    PT Time Calculation (min) 40 min    Equipment Utilized During Treatment Gait belt    Activity Tolerance Other (comment)   difficulty attending to task   Behavior During Therapy Impulsive             Past Medical History:  Diagnosis Date   Brain injury (HCC)    CAD (coronary artery disease)    HLD (hyperlipidemia)    Hypertension    last pregnancy   Ischemic cardiomyopathy    STEMI (ST elevation myocardial infarction) (HCC) 09/2019   SCAD with aneurysmal dilation of proximal LAD.   Vaginal Pap smear, abnormal    when she was 38yo   Ventricular fibrillation (HCC) 09/23/2019   Past Surgical History:  Procedure Laterality Date   CARDIAC CATHETERIZATION     IR GASTROSTOMY TUBE MOD SED  10/07/2019   IR GASTROSTOMY TUBE REMOVAL  07/02/2020   IR REPLC GASTRO/COLONIC TUBE PERCUT W/FLUORO  11/03/2021   IR REPLC GASTRO/COLONIC TUBE PERCUT W/FLUORO  03/09/2023   LEFT HEART CATH AND CORONARY ANGIOGRAPHY N/A 09/23/2019   Procedure: LEFT HEART CATH AND CORONARY ANGIOGRAPHY;  Surgeon: Sammy Crisp, MD;  Location: [facility] INVASIVE CV LAB;  Service: Cardiovascular;  Laterality: N/A;   SUBQ ICD IMPLANT N/A 11/26/2020   Procedure: SUBQ ICD IMPLANT;  Surgeon: Boyce Byes, MD;  Location: [facility] INVASIVE CV LAB;  Service: Cardiovascular;  Laterality: N/A;   Patient Active Problem List   Diagnosis Date Noted   Apraxia 10/05/2021   S/P ICD (internal cardiac defibrillator) procedure 11/27/2020   Hx of Cardiac arrest (HCC) 11/26/2020   Abnormality of gait 03/16/2020   Sleep  disturbance 03/16/2020   Oropharyngeal dysphagia 02/12/2020   Coronary artery disease involving native coronary artery of native heart without angina pectoris 12/12/2019   Ischemic cardiomyopathy 12/12/2019   Brain injury (HCC)    Prediabetes    Anoxic brain injury (HCC) 11/05/2019   Dysphagia 11/05/2019   Physical deconditioning 11/05/2019   Acute delirium 11/05/2019   Respiratory failure (HCC)    Encounter for central line placement    Ventricular fibrillation (HCC) 09/23/2019   Acute combined systolic and diastolic heart failure (HCC) 09/23/2019   Ventricular tachycardia, polymorphic (HCC) 09/23/2019   Pelvic pain affecting pregnancy 10/15/2015   Supervision of normal pregnancy in third trimester 09/28/2015   Poor weight gain of pregnancy 09/28/2015   Iron  deficiency anemia of pregnancy 09/01/2015   Increased BMI (body mass index) 07/29/2015    ONSET DATE: August 2021  REFERRING DIAG:  G93.1 (ICD-10-CM) - Anoxic brain injury (HCC)  R48.2 (ICD-10-CM) - Apraxia    THERAPY DIAG:  Anoxic brain injury (HCC)  Other lack of coordination  Unsteadiness on feet  Muscle weakness (generalized)  Difficulty in walking, not elsewhere classified  Abnormality of gait and mobility  Other abnormalities of gait and mobility  Rationale for Evaluation and Treatment: Rehabilitation  SUBJECTIVE:  SUBJECTIVE STATEMENT:  Today: mother reports that she is doing okay. States that turning to get on stair lift chair is the most challenging functional daily task.      From Eval: Pt known to PT clinic. Previously seen for outpatient services March 2024. Pt was receiving HH PT until October 2024 per her mother's report. [name] reports pt using HHA for ambulation, was using RW but she says was told not to  do to difficulty with safe technique. Pt has fallen 1x in the past six months trying to ambulate on her own. Pt requires assistance with ADLs, including bathing and dressing. [name] reports pt never indicates any pain. Pt with severe aphasia so is unable to answer questions/pt's mom provides hx. She reports pt has impaired short-term memory, difficulty with stair climbing (has stair lift at home). She reports pt's BLE are weaker than they used to be. The pt is probably limited to <10 min with ambulation due to poor endurance.  Pt accompanied by: self, mother   PERTINENT HISTORY:   Pt known to PT clinic with the following hx per previous PT course:   " Patient is a 38 year old with diagnois of anoxic brain injury. She was receiving outpatient PT until Feb. 2022 last year but ran out of visits. Patient has past medical history of Anoxic Brain Injury 09/2019, CAD, HLD, HTN, ISCHEMIC CARDIMYOPATHY, STEMI, V-FIB, ICD on 11/29/2020  .On 09/23/19 patient presented to ED 2 weeks postpartum (had emergent C-Section) with chest pain. In ED sustained VFib Cardiac Arrest requiring intubation. EKG showed ST elevation, Cath Lab showed aneurysmal dissection of the LAD was identified. Course complicated by encephalopathy, anoxic brain injury, enterobacterial PNA. Patient is currently living with mom. Mother reports she is abel to walk some with a walker with supervision but primarily ambulates with HHA of mother who is her guardian. Mother also reports she requires assist for all transfers and ADL's. "  PAIN:  Are you having pain? No  PRECAUTIONS: Fall  WEIGHT BEARING RESTRICTIONS: No  FALLS: Has patient fallen in last 6 months? Yes. Number of falls 1  LIVING ENVIRONMENT: Lives with: lives with their family, mother Lives in: House/apartment Stairs:  has stairs but stair lift installed, pt using lift Has following equipment at home: Environmental consultant - 2 wheeled, Wheelchair (manual), shower chair, and Grab bars, pt  uses stair lift  PLOF: Needs assistance with ADLs  PATIENT GOALS: balance, walking, and pt states basketball  OBJECTIVE:  Note: Objective measures were completed at Evaluation unless otherwise noted.  DIAGNOSTIC FINDINGS:  None recent in chart  COGNITION: Overall cognitive status: impaired   SENSATION: Unable to complete testing due to impaired cognition, pt unable to report if sensation impairment present, pt mother unsure   COORDINATION: Pt unable to follow cues for testing  EDEMA:  Mom reports pt's feet do swell from time to time   Convergence: Impaired L eye  MUSCLE TONE: impaired bilat LE   POSTURE: difficulty assessing due to pt ataxic movement, typically observed to sacral sit in session   LOWER EXTREMITY MMT:  *accuracy of testing likely impacted by pt difficulty following cues  MMT Right Eval Left Eval  Hip flexion 4+ 4+  Hip extension    Hip abduction 4+ 4+  Hip adduction 4+ 4+  Hip internal rotation    Hip external rotation    Knee flexion 4+ 4+  Knee extension 5 5  Ankle dorsiflexion    Ankle plantarflexion    Ankle  inversion    Ankle eversion    (Blank rows = not tested)   TRANSFERS: HHA  tried in session, very unsteady, pt has difficulty with motor planning, little success with cues due to distraction, very unsafe Attempted in session with RW, pt unsteady, gait ataxic, continued significant difficulty with motor planning requiring up to min a + 2 and multi-modal cues to safely sit in chair.    STAIRS: Impaired per pt's mother GAIT: ataxic   FUNCTIONAL TESTS:  5 times sit to stand: 1 min 43 sec Timed up and go (TUG): 1 min 30 sec with RW 10 meter walk test: 0.25 m/s with RW extensive cuing to complete activity  PATIENT SURVEYS:  SIS-16 filled out by pt's mother, used as questions relevant to pt: 45                                                                                                                              TREATMENT  DATE: 05/30/2023   Gait with RW x 150ft with mod assist for AD management to prevent veer to the R. Mod-max cues to keep BLE within RW with poor carryover. Constant cues to attend to task.   Sit<>stand with push with at least 1 UE form arm rest 2 x 5. Mod-max instruction from PT for proper UE placement with 75% caryover on each repetition.  Foot tap on 2inch airex pad x 8 bil min assist oveall and mod-max cues for attention to task and position of RW.  Forward/reverse gait with RW 22ft witih mod assist and maximal instruction for posture, sequencing, and attention to task. Attempted second bout, but pt unable to perform reverse gait for second bout due to apraxia and pt attempting to turn to return to chair  Forward gait 13ft followed by 360deg turn to return to sitting in amr chair. Moderate cues for  sequencing and attention to single step in task to Improve motor planning to to prevent premature sitting.   Gait with RW to transport chair x 24ft followed by 180deg turn to sit on side of chair. Mod-max cues as listed above and min-mod assist for AD management in turn.   PATIENT EDUCATION: Education details: assessment findings, goals, plan, prognosis, transfer technique, safe use of AD Person educated: Patient and Parent Education method: Explanation, Demonstration, Tactile cues, and Verbal cues Education comprehension: returned demonstration, verbal cues required, tactile cues required, and needs further education  HOME EXERCISE PROGRAM: To be initiated next 1-2 visits   GOALS: Goals reviewed with patient? Yes   SHORT TERM GOALS: Target date: 07/11/2023    Patient will be independent in home exercise program to improve strength/mobility for better functional independence with ADLs. Baseline: Goal status: INITIAL   LONG TERM GOALS: Target date: 08/22/2023     1.  Patient will complete five times sit to stand test in < 35 seconds indicating an increased LE strength and improved  balance. Baseline: previously 37 sec  with UUE 04/19/22 today 05/30/23: 1 min 43 seconds Goal status: INITIAL  2. Patient will demo ability to ambulate > 400 ft w/ LRAD versus hand held assist min assist to demonstrate improved household mobility and short community distances.  Baseline: 4/28: 125ft with RW. Mod assist for AD management to prevent veer to the R.  Goal status: INITIAL  3.  Patient will increase 10 meter walk test to >1.46m/s as to improve gait speed for better community ambulation and to reduce fall risk. Baseline: previously 0.36 m/s on 04/19/22 : 0.25 m/s Goal status: INITIAL  4.  Patient will reduce timed up and go to <50 seconds to reduce fall risk and demonstrate improved transfer/gait ability Baseline: previously 91 sec with RW on 04/19/22; 05/30/23: 1 min 30 sec with RW  Goal status: INITIAL  5. Pt will exhibit correct and safe technique with stand>sit into a standard chair or transport chair to reduce risk of falling.   Baseline: pt current technique unsafe, poor motor planning  Goal status: INITIAL  6. The pt will demonstrate at least a 15 point improvement on SIS-16 to indicate increased ease with ADLs.  Baseline: 45 (filled out by pt's mother)  Goal status: INITIAL    ASSESSMENT:  CLINICAL IMPRESSION: Pt is a pleasant 38 y/o female seen today for PT treatment for impairments due to anoxic brain injury in 2021.  [name] must provide history due to pt having severe aphasia. Gait training instructed by PT for activity tolerance and allow access to entire home environment for >157ft on this day, but max cues for posture, RW position, attention to task, and assist to prevent RW veer to the R. Blocked practice transfer training with mild caryover for UE position and attempted reverse gait with inconsistent success, resulting to turning/transfer training. Max cues for proper sequencing and attention to task.  The pt will benefit from further skilled PT to improve  impairments in order to decrease fall risk and increase safety with mobility and ADLs.   OBJECTIVE IMPAIRMENTS: Abnormal gait, decreased activity tolerance, decreased balance, decreased coordination, decreased endurance, decreased knowledge of use of DME, decreased mobility, difficulty walking, decreased strength, decreased safety awareness, increased edema, impaired tone, improper body mechanics, and postural dysfunction.   ACTIVITY LIMITATIONS: carrying, lifting, bending, standing, squatting, stairs, transfers, bathing, dressing, locomotion level, and caring for others  PARTICIPATION LIMITATIONS: meal prep, cleaning, laundry, medication management, personal finances, driving, shopping, community activity, occupation, and yard work  PERSONAL FACTORS: Sex, Time since onset of injury/illness/exacerbation, and 1-2 comorbidities: per chart PMH includes CAD, HTN, ischemic cardiomyopathy, STEMI, ventricular fibrillation, dysphagia, s/p ICD, apraxia   are also affecting patient's functional outcome.   REHAB POTENTIAL: Fair    CLINICAL DECISION MAKING: Evolving/moderate complexity  EVALUATION COMPLEXITY: High  PLAN:  PT FREQUENCY: 1-2x/week  PT DURATION: 12 weeks  PLANNED INTERVENTIONS: 97164- PT Re-evaluation, 97750- Physical Performance Testing, 97110-Therapeutic exercises, 97530- Therapeutic activity, [id number]- Neuromuscular re-education, 97535- Self Care, 16109- Manual therapy, [id number]- Gait training, [phone]- Orthotic Initial, [phone]- Orthotic/Prosthetic subsequent, [phone]- Canalith repositioning, Patient/Family education, Balance training, Stair training, Taping, Joint mobilization, Spinal mobilization, Vestibular training, DME instructions, Wheelchair mobility training, Cryotherapy, and Moist heat  PLAN FOR NEXT SESSION:   Initiate HEP.  Gait training.   Barbara Book, PT 06/04/2023, 11:49 AM

## 2023-06-05 ENCOUNTER — Ambulatory Visit: Attending: Cardiology | Admitting: Cardiology

## 2023-06-05 ENCOUNTER — Encounter: Payer: Self-pay | Admitting: Cardiology

## 2023-06-05 ENCOUNTER — Other Ambulatory Visit: Payer: Self-pay | Admitting: Cardiology

## 2023-06-05 VITALS — BP 92/66 | HR 91 | Ht 69.0 in | Wt 192.0 lb

## 2023-06-05 DIAGNOSIS — E785 Hyperlipidemia, unspecified: Secondary | ICD-10-CM

## 2023-06-05 DIAGNOSIS — I2510 Atherosclerotic heart disease of native coronary artery without angina pectoris: Secondary | ICD-10-CM

## 2023-06-05 DIAGNOSIS — I255 Ischemic cardiomyopathy: Secondary | ICD-10-CM

## 2023-06-05 DIAGNOSIS — I5042 Chronic combined systolic (congestive) and diastolic (congestive) heart failure: Secondary | ICD-10-CM

## 2023-06-05 DIAGNOSIS — I4901 Ventricular fibrillation: Secondary | ICD-10-CM

## 2023-06-05 DIAGNOSIS — G931 Anoxic brain damage, not elsewhere classified: Secondary | ICD-10-CM

## 2023-06-05 MED ORDER — TRANSDERM-SCOP 1 MG/3DAYS TD PT72: 1.0000 | MEDICATED_PATCH | TRANSDERMAL | 1 refills | Status: DC

## 2023-06-05 NOTE — Progress Notes (Signed)
 Cardiology Office Note:  .   Date:  06/05/2023  ID:  Beverly [name], DOB [birth year], MRN [id number] PCP: [facility], [facility]  [facility] Providers Cardiologist:  Sammy Crisp, MD Electrophysiologist:  Boyce Byes, MD    History of Present Illness: .   Beverly [name] is a 38 y.o. 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   Patient had a complicated hospitalization less than 6 5 days beginning on 09/23/2019 after presenting to the emergency department with chest pain approximately 2 weeks postpartum.  She had witnessed cardiac arrest in the ED with ventricular tachycardia changing to ventricular fibrillation.  Ultimately ROSC was achieved after several shocks and rounds of epinephrine.  Initial postresuscitation EKG with significant anterior ST segment elevated which quickly resolved.  Urgent cardiac catheterization revealed aneurysmal dilatation of the ostial/proximal LAD without.  Area of dissection within the aneurysmal segment.  She was transferred to [facility] for targeted care including targeted temperature management.  Echocardiogram 09/24/2019 revealed LVEF 30-25%, wall motion abnormalities, LV mildly dilated, no significant valvular abnormalities.  Prolonged Coast on the ventilator and underwent tracheostomy and gastrostomy tube placement.  Tracheostomy was covered by the time of discharge on 11/28/2019.    Previously had subcutaneous ICD implanted in 2022.  Continues to follow with EP.  Echocardiogram completed in 2021 revealed an LVEF 50-55%, no RWMA, trivial MR.   She was last seen in clinic 11/07/2022 accompanied by mother.  She has been more  interactive during the visit.  She remains stable since last visit.  Mother states that she had had no shortness of breath or chest discomfort.  She has had no hospitalizations or visits to the emergency department.  She was evaluated at the [facility] emergency department 03/02/2023 for accidental removal of G-tube.  The stomach started to close and they were unable to pass to 20 [place] and 18 [place] but were able to get 60 advantage in.  Radiologist reported good placement of the tube.  She was advised to follow-up as an outpatient with her GI provider.  She returns to clinic today accompanied by mother.  She is much more interactive today during the visit but is still nonverbal but is making eye contact and shaking her head occasionally to answer questions appropriately.  She has remained stable since her last visit from a cardiac perspective.  Mother states that she has noted some occasional swelling in her feet in the evening but she denies any pain or shortness of breath.  She has been participating in therapy and has been more verbal at home since being taken off of Adderall.  ROS: A full review of systems has been reviewed and considered negative except was been listed in the HPI  Studies Reviewed: [name]   EKG Interpretation Date/Time:  Tuesday June 05 2023 15:37:37 EDT Ventricular Rate:  91 PR Interval:  160 QRS Duration:  84 QT Interval:  350 QTC Calculation: 430 R Axis:   111  Text Interpretation: Normal sinus rhythm Low voltage QRS Possible Inferior infarct (cited on or before 05-Jun-2023) Possible Anterolateral infarct (cited on or before 05-Jun-2023) When compared with ECG of 07-Nov-2022 10:24, No significant change since last tracing Confirmed by Beverly [name] ([id number]) on 06/05/2023 3:49:42 PM  TTE 12/30/19  1. Left ventricular ejection fraction, by estimation, is 50 to 55%. The  left ventricle has low normal function. The left ventricle has no regional  wall motion abnormalities. Left  ventricular diastolic parameters are  indeterminate.   2. The mitral valve is grossly normal. Trivial mitral valve  regurgitation.  Risk Assessment/Calculations:             Physical Exam:   VS:  BP 92/66   Pulse 91   Ht 5\' 9"  (1.753 m)   Wt 192 lb (87.1 kg)   SpO2 96%   BMI 28.35 kg/m    Wt Readings from Last 3 Encounters:  06/05/23 192 lb (87.1 kg)  05/23/23 196 lb 9.6 oz (89.2 kg)  03/03/23 160 lb (72.6 kg)    GEN: Well nourished, well developed in no acute distress NECK: No JVD; No carotid bruits CARDIAC: RRR, no murmurs, rubs, gallops RESPIRATORY:  Clear to auscultation without rales, wheezing or rhonchi  ABDOMEN: Soft, non-tender, non-distended EXTREMITIES:  No edema; No deformity   ASSESSMENT AND PLAN: .   Chronic combined systolic and diastolic heart failure/ischemic cardiomyopathy with previous LVEF of 50-55%, no RWMA, status post subcutaneous ICD.  She is euvolemic on exam today mother denies any shortness of breath dyspnea on exertion with occasional peripheral edema.  She remains on carvedilol  3.125 mg twice daily.  She has not required diuretic therapy.  Unfortunately she continues to have blood pressures on the softer side which limits the escalation of GDMT.  She has previously tried on low-dose losartan  and spironolactone  but due to drops in blood pressures these medications had subsequently been discontinued.  Ventricular fibrillation arrest status post subcutaneous ICD that continues to be followed by EP.  She continues to do remote checks.  She has been encouraged to continue to follow-up with EP.  Coronary artery disease/spontaneous coronary artery dissection status post V-fib arrest in the setting of scad/ostial LAD in 2021 complicated by anoxic brain injury.  She is continued on carvedilol  3.125 mg twice daily and atorvastatin  40 mg daily as well as aspirin  81 mg daily.  She recently had labs done last time when she was here in October no blood work has been  reordered today.  EKG today reveals sinus rhythm with a rate of 91 with an old anterior and inferior infarct with no significant change from prior studies.  No further ischemic workup is planned at this time.  Mixed hyperlipidemia with last LDL 65 in 11/2022.  She is continued on atorvastatin  40 mg daily.  Anoxic brain injury continues to be followed by Dr. Emelie Hanger.       Dispo: Patient return to clinic as MD/APP in 6 months or sooner if needed.  She is also requesting referral for new PCP today with [facility] taking new patients.  A refill on her scopolamine  for 30 days has been sent to the pharmacy of choice until potentially she can get an appointment with a new primary care provider.  Signed, Dakia Schifano, NP

## 2023-06-05 NOTE — Patient Instructions (Addendum)
 Medication Instructions:  Your physician recommends that you continue on your current medications as directed. Please refer to the Current Medication list given to you today.  *If you need a refill on your cardiac medications before your next appointment, please call your pharmacy*  Lab Work: No labs ordered today  If you have labs (blood work) drawn today and your tests are completely normal, you will receive your results only by: MyChart Message (if you have MyChart) OR A paper copy in the mail If you have any lab test that is abnormal or we need to change your treatment, we will call you to review the results.  Testing/Procedures: No test ordered today   Follow-Up: At [facility], you and your health needs are our priority.  As part of our continuing mission to provide you with exceptional heart care, our providers are all part of one team.  This team includes your primary Cardiologist (physician) and Advanced Practice Providers or APPs (Physician Assistants and Nurse Practitioners) who all work together to provide you with the care you need, when you need it.  Your next appointment:   6 month(s)  Provider:   You may see Sammy Crisp, MD or one of the following Advanced Practice Providers on your designated Care Team:   Laneta Pintos, NP Gildardo Labrador, PA-C Varney Gentleman, PA-C Cadence Alamo, PA-C Ronald Cockayne, NP Morey Ar, NP    A REFERRAL TO  [facility] at [facility] [address] [place]  [phone]

## 2023-06-06 ENCOUNTER — Ambulatory Visit

## 2023-06-06 ENCOUNTER — Other Ambulatory Visit: Payer: Self-pay | Admitting: Cardiology

## 2023-06-06 DIAGNOSIS — R278 Other lack of coordination: Secondary | ICD-10-CM

## 2023-06-06 DIAGNOSIS — R2681 Unsteadiness on feet: Secondary | ICD-10-CM

## 2023-06-06 DIAGNOSIS — G931 Anoxic brain damage, not elsewhere classified: Secondary | ICD-10-CM | POA: Diagnosis not present

## 2023-06-06 DIAGNOSIS — R262 Difficulty in walking, not elsewhere classified: Secondary | ICD-10-CM

## 2023-06-06 DIAGNOSIS — M6281 Muscle weakness (generalized): Secondary | ICD-10-CM

## 2023-06-06 NOTE — Telephone Encounter (Signed)
 What brand do they have/cover?

## 2023-06-06 NOTE — Therapy (Signed)
 OUTPATIENT PHYSICAL THERAPY NEURO TREATMENT   Patient Name: Beverly [name] MRN: [id number] DOB:1985/08/12, 38 y.o., female Today's Date: 06/06/2023   PCP: [facility] REFERRING PROVIDER: Rawland Caddy, MD  END OF SESSION:  PT End of Session - 06/06/23 1155     Visit Number 3    Number of Visits 25    Date for PT Re-Evaluation 08/22/23    PT Start Time 1104    PT Stop Time 1145    PT Time Calculation (min) 41 min    Equipment Utilized During Treatment Gait belt    Activity Tolerance Other (comment);Patient tolerated treatment well   difficulty attending to task   Behavior During Therapy Impulsive              Past Medical History:  Diagnosis Date   Brain injury [facility])    CAD (coronary artery disease)    HLD (hyperlipidemia)    Hypertension    last pregnancy   Ischemic cardiomyopathy    STEMI (ST elevation myocardial infarction) (HCC) 09/2019   SCAD with aneurysmal dilation of proximal LAD.   Vaginal Pap smear, abnormal    when she was 38yo   Ventricular fibrillation (HCC) 09/23/2019   Past Surgical History:  Procedure Laterality Date   CARDIAC CATHETERIZATION     IR GASTROSTOMY TUBE MOD SED  10/07/2019   IR GASTROSTOMY TUBE REMOVAL  07/02/2020   IR REPLC GASTRO/COLONIC TUBE PERCUT W/FLUORO  11/03/2021   IR REPLC GASTRO/COLONIC TUBE PERCUT W/FLUORO  03/09/2023   LEFT HEART CATH AND CORONARY ANGIOGRAPHY N/A 09/23/2019   Procedure: LEFT HEART CATH AND CORONARY ANGIOGRAPHY;  Surgeon: Sammy Crisp, MD;  Location: [facility] INVASIVE CV LAB;  Service: Cardiovascular;  Laterality: N/A;   SUBQ ICD IMPLANT N/A 11/26/2020   Procedure: SUBQ ICD IMPLANT;  Surgeon: Boyce Byes, MD;  Location: [facility] INVASIVE CV LAB;  Service: Cardiovascular;  Laterality: N/A;   Patient Active Problem List   Diagnosis Date Noted   Apraxia 10/05/2021   S/P ICD (internal cardiac defibrillator) procedure 11/27/2020   Hx of Cardiac arrest (HCC) 11/26/2020    Abnormality of gait 03/16/2020   Sleep disturbance 03/16/2020   Oropharyngeal dysphagia 02/12/2020   Coronary artery disease involving native coronary artery of native heart without angina pectoris 12/12/2019   Ischemic cardiomyopathy 12/12/2019   Brain injury (HCC)    Prediabetes    Anoxic brain injury (HCC) 11/05/2019   Dysphagia 11/05/2019   Physical deconditioning 11/05/2019   Acute delirium 11/05/2019   Respiratory failure (HCC)    Encounter for central line placement    Ventricular fibrillation (HCC) 09/23/2019   Acute combined systolic and diastolic heart failure (HCC) 09/23/2019   Ventricular tachycardia, polymorphic (HCC) 09/23/2019   Pelvic pain affecting pregnancy 10/15/2015   Supervision of normal pregnancy in third trimester 09/28/2015   Poor weight gain of pregnancy 09/28/2015   Iron  deficiency anemia of pregnancy 09/01/2015   Increased BMI (body mass index) 07/29/2015    ONSET DATE: August 2021  REFERRING DIAG:  G93.1 (ICD-10-CM) - Anoxic brain injury (HCC)  R48.2 (ICD-10-CM) - Apraxia    THERAPY DIAG:  Other lack of coordination  Unsteadiness on feet  Difficulty in walking, not elsewhere classified  Muscle weakness (generalized)  Rationale for Evaluation and Treatment: Rehabilitation  SUBJECTIVE:  SUBJECTIVE STATEMENT:  Pt feeling OK.      From Eval: Pt known to PT clinic. Previously seen for outpatient services March 2024. Pt was receiving HH PT until October 2024 per her mother's report. [name] reports pt using HHA for ambulation, was using RW but she says was told not to do to difficulty with safe technique. Pt has fallen 1x in the past six months trying to ambulate on her own. Pt requires assistance with ADLs, including bathing and dressing. [name]  reports pt never indicates any pain. Pt with severe aphasia so is unable to answer questions/pt's mom provides hx. She reports pt has impaired short-term memory, difficulty with stair climbing (has stair lift at home). She reports pt's BLE are weaker than they used to be. The pt is probably limited to <10 min with ambulation due to poor endurance.  Pt accompanied by: self, mother   PERTINENT HISTORY:   Pt known to PT clinic with the following hx per previous PT course:   " Patient is a 38 year old with diagnois of anoxic brain injury. She was receiving outpatient PT until Feb. 2022 last year but ran out of visits. Patient has past medical history of Anoxic Brain Injury 09/2019, CAD, HLD, HTN, ISCHEMIC CARDIMYOPATHY, STEMI, V-FIB, ICD on 11/29/2020  .On 09/23/19 patient presented to ED 2 weeks postpartum (had emergent C-Section) with chest pain. In ED sustained VFib Cardiac Arrest requiring intubation. EKG showed ST elevation, Cath Lab showed aneurysmal dissection of the LAD was identified. Course complicated by encephalopathy, anoxic brain injury, enterobacterial PNA. Patient is currently living with mom. Mother reports she is abel to walk some with a walker with supervision but primarily ambulates with HHA of mother who is her guardian. Mother also reports she requires assist for all transfers and ADL's. "  PAIN:  Are you having pain? No  PRECAUTIONS: Fall  WEIGHT BEARING RESTRICTIONS: No  FALLS: Has patient fallen in last 6 months? Yes. Number of falls 1  LIVING ENVIRONMENT: Lives with: lives with their family, mother Lives in: House/apartment Stairs:  has stairs but stair lift installed, pt using lift Has following equipment at home: Environmental consultant - 2 wheeled, Wheelchair (manual), shower chair, and Grab bars, pt uses stair lift  PLOF: Needs assistance with ADLs  PATIENT GOALS: balance, walking, and pt states basketball  OBJECTIVE:  Note: Objective measures were completed at Evaluation unless  otherwise noted.  DIAGNOSTIC FINDINGS:  None recent in chart  COGNITION: Overall cognitive status: impaired   SENSATION: Unable to complete testing due to impaired cognition, pt unable to report if sensation impairment present, pt mother unsure   COORDINATION: Pt unable to follow cues for testing  EDEMA:  Mom reports pt's feet do swell from time to time   Convergence: Impaired L eye  MUSCLE TONE: impaired bilat LE   POSTURE: difficulty assessing due to pt ataxic movement, typically observed to sacral sit in session   LOWER EXTREMITY MMT:  *accuracy of testing likely impacted by pt difficulty following cues  MMT Right Eval Left Eval  Hip flexion 4+ 4+  Hip extension    Hip abduction 4+ 4+  Hip adduction 4+ 4+  Hip internal rotation    Hip external rotation    Knee flexion 4+ 4+  Knee extension 5 5  Ankle dorsiflexion    Ankle plantarflexion    Ankle inversion    Ankle eversion    (Blank rows = not tested)   TRANSFERS: HHA  tried in  session, very unsteady, pt has difficulty with motor planning, little success with cues due to distraction, very unsafe Attempted in session with RW, pt unsteady, gait ataxic, continued significant difficulty with motor planning requiring up to min a + 2 and multi-modal cues to safely sit in chair.    STAIRS: Impaired per pt's mother GAIT: ataxic   FUNCTIONAL TESTS:  5 times sit to stand: 1 min 43 sec Timed up and go (TUG): 1 min 30 sec with RW 10 meter walk test: 0.25 m/s with RW extensive cuing to complete activity  PATIENT SURVEYS:  SIS-16 filled out by pt's mother, used as questions relevant to pt: 45                                                                                                                              TREATMENT DATE: 05/30/2023  Gait belt donned throughout with CGA-min assist provided  NMR:  Seated soccer ball kicks for coordination/timing x multiple reps each LE - pt exhibits good timing  STS  3x5 with instruction in slow decent into chair, safe approach to sit>stand as well (pt tendency to try to sit to soon before she is close to chair), instruction in use of use of arm-rests. Pt with some observed fatigue in BLEs.  Provided to pt and pt's mother for pt to practice at home, written instruction for use of arm rests for sit>stand and slow descent into chair stand>sit: Access Code: [email or web address] URL: [email or web address] Date: 06/06/2023 Prepared by: Aminta Kales  Exercises - Sit to Stand with Counter Support  - 1 x daily - 5 x weekly - 3 sets - 5 reps  Transfer practice: STS >RW, ambulate 6 ft with RW, turn to approach other chair stand>sit 2x.  Pt tries to sit before fully backing up to chair. Struggles even with cuing/demo from PT for safe proximity to RW and retro-step with RW.  Pt provides education/demo of retro-stepping with RW and pulling RW back to safely approach chair -Pt with multiple attempts but struggles to pull back RW. Requires PT to pull chair to pt to prevent LOB.  Seated, pull walker backwards toward pt x multiple reps. Pt able to utilize correct technique following multi-modal cuing and instruction in seated position  Progression to standing again, retro steps and pulling RW backward - exhibits 2-3 successful attempts, but otherwise still requires assistance with all other reps.  Static stand with pushing RW away and pulling it back 5x - PT provides significant assist throughout. Pt slightly unsteady when attempting to pull RW back, tendency to lose balance posteriorly    PATIENT EDUCATION: Education details: exercise technique, transfer technique, HEP Person educated: Patient and Parent Education method: Explanation, Demonstration, Tactile cues, Verbal cues, and Handouts Education comprehension: returned demonstration, verbal cues required, tactile cues required, and needs further education  HOME EXERCISE PROGRAM: Provided to pt and pt's mother  for pt to practice at home, written instruction for use of  arm rests for sit>stand and slow descent into chair stand>sit: Access Code: [email or web address] URL: [email or web address] Date: 06/06/2023 Prepared by: Aminta Kales  Exercises - Sit to Stand with Counter Support  - 1 x daily - 5 x weekly - 3 sets - 5 reps  GOALS: Goals reviewed with patient? Yes   SHORT TERM GOALS: Target date: 07/11/2023    Patient will be independent in home exercise program to improve strength/mobility for better functional independence with ADLs. Baseline: Goal status: INITIAL   LONG TERM GOALS: Target date: 08/22/2023     1.  Patient will complete five times sit to stand test in < 35 seconds indicating an increased LE strength and improved balance. Baseline: previously 37 sec with UUE 04/19/22 today 05/30/23: 1 min 43 seconds Goal status: INITIAL  2. Patient will demo ability to ambulate > 400 ft w/ LRAD versus hand held assist min assist to demonstrate improved household mobility and short community distances.  Baseline: 4/28: 115ft with RW. Mod assist for AD management to prevent veer to the R.  Goal status: INITIAL  3.  Patient will increase 10 meter walk test to >1.8m/s as to improve gait speed for better community ambulation and to reduce fall risk. Baseline: previously 0.36 m/s on 04/19/22 : 0.25 m/s Goal status: INITIAL  4.  Patient will reduce timed up and go to <50 seconds to reduce fall risk and demonstrate improved transfer/gait ability Baseline: previously 91 sec with RW on 04/19/22; 05/30/23: 1 min 30 sec with RW  Goal status: INITIAL  5. Pt will exhibit correct and safe technique with stand>sit into a standard chair or transport chair to reduce risk of falling.   Baseline: pt current technique unsafe, poor motor planning  Goal status: INITIAL  6. The pt will demonstrate at least a 15 point improvement on SIS-16 to indicate increased ease with ADLs.  Baseline: 45 (filled out by  pt's mother)  Goal status: INITIAL    ASSESSMENT:  CLINICAL IMPRESSION: Completed further instruction in safe STS and transfer training. Pt found to have difficulty maintaining proximity to RW, particularly when attempting retro-step to back into chair. Pt with tendency to lose balance posteriorly when trying to pull RW back, requiring min a from PT or PT bringing chair to pt for safe descent. Pt with improved motor planning with pulling walker back in seated position, so balance might be a large component of why this is difficult in standing. The pt will benefit from further skilled PT to improve impairments in order to decrease fall risk and increase safety with mobility and ADLs.   OBJECTIVE IMPAIRMENTS: Abnormal gait, decreased activity tolerance, decreased balance, decreased coordination, decreased endurance, decreased knowledge of use of DME, decreased mobility, difficulty walking, decreased strength, decreased safety awareness, increased edema, impaired tone, improper body mechanics, and postural dysfunction.   ACTIVITY LIMITATIONS: carrying, lifting, bending, standing, squatting, stairs, transfers, bathing, dressing, locomotion level, and caring for others  PARTICIPATION LIMITATIONS: meal prep, cleaning, laundry, medication management, personal finances, driving, shopping, community activity, occupation, and yard work  PERSONAL FACTORS: Sex, Time since onset of injury/illness/exacerbation, and 1-2 comorbidities: per chart PMH includes CAD, HTN, ischemic cardiomyopathy, STEMI, ventricular fibrillation, dysphagia, s/p ICD, apraxia   are also affecting patient's functional outcome.   REHAB POTENTIAL: Fair    CLINICAL DECISION MAKING: Evolving/moderate complexity  EVALUATION COMPLEXITY: High  PLAN:  PT FREQUENCY: 1-2x/week  PT DURATION: 12 weeks  PLANNED INTERVENTIONS: 97164- PT Re-evaluation, 97750- Physical Performance Testing, 97110-Therapeutic exercises, 97530- Therapeutic  activity,  16109- Neuromuscular re-education, [phone]- Self Care, 09811- Manual therapy, [phone]- Gait training, [phone]- Orthotic Initial, [phone]- Orthotic/Prosthetic subsequent, [phone]- Canalith repositioning, Patient/Family education, Balance training, Stair training, Taping, Joint mobilization, Spinal mobilization, Vestibular training, DME instructions, Wheelchair mobility training, Cryotherapy, and Moist heat  PLAN FOR NEXT SESSION:   Transfer training, safe use of RW  Gait training.  Strengthening  Samie Crews, [employer] 06/06/2023, 11:56 AM

## 2023-06-11 ENCOUNTER — Ambulatory Visit: Attending: Physical Medicine & Rehabilitation | Admitting: Physical Therapy

## 2023-06-11 DIAGNOSIS — G931 Anoxic brain damage, not elsewhere classified: Secondary | ICD-10-CM | POA: Diagnosis present

## 2023-06-11 DIAGNOSIS — R278 Other lack of coordination: Secondary | ICD-10-CM | POA: Diagnosis present

## 2023-06-11 DIAGNOSIS — M6281 Muscle weakness (generalized): Secondary | ICD-10-CM | POA: Insufficient documentation

## 2023-06-11 DIAGNOSIS — R2689 Other abnormalities of gait and mobility: Secondary | ICD-10-CM | POA: Insufficient documentation

## 2023-06-11 DIAGNOSIS — R262 Difficulty in walking, not elsewhere classified: Secondary | ICD-10-CM | POA: Diagnosis present

## 2023-06-11 DIAGNOSIS — R269 Unspecified abnormalities of gait and mobility: Secondary | ICD-10-CM | POA: Insufficient documentation

## 2023-06-11 DIAGNOSIS — R2681 Unsteadiness on feet: Secondary | ICD-10-CM | POA: Diagnosis present

## 2023-06-11 NOTE — Therapy (Signed)
 OUTPATIENT PHYSICAL THERAPY NEURO EVALUATION   Patient Name: Beverly [name] MRN: [id number] DOB:05-12-1985, 38 y.o., female Today's Date: 06/11/2023   PCP: [facility] REFERRING PROVIDER: Rawland Caddy, MD  END OF SESSION:  PT End of Session - 06/11/23 1235     Visit Number 4    Number of Visits 25    Date for PT Re-Evaluation 08/22/23    PT Start Time 1148    PT Stop Time 1229    PT Time Calculation (min) 41 min    Equipment Utilized During Treatment Gait belt    Activity Tolerance Other (comment);Patient tolerated treatment well   difficulty attending to task   Behavior During Therapy Impulsive              Past Medical History:  Diagnosis Date   Brain injury [facility])    CAD (coronary artery disease)    HLD (hyperlipidemia)    Hypertension    last pregnancy   Ischemic cardiomyopathy    STEMI (ST elevation myocardial infarction) (HCC) 09/2019   SCAD with aneurysmal dilation of proximal LAD.   Vaginal Pap smear, abnormal    when she was 38yo   Ventricular fibrillation (HCC) 09/23/2019   Past Surgical History:  Procedure Laterality Date   CARDIAC CATHETERIZATION     IR GASTROSTOMY TUBE MOD SED  10/07/2019   IR GASTROSTOMY TUBE REMOVAL  07/02/2020   IR REPLC GASTRO/COLONIC TUBE PERCUT W/FLUORO  11/03/2021   IR REPLC GASTRO/COLONIC TUBE PERCUT W/FLUORO  03/09/2023   LEFT HEART CATH AND CORONARY ANGIOGRAPHY N/A 09/23/2019   Procedure: LEFT HEART CATH AND CORONARY ANGIOGRAPHY;  Surgeon: Sammy Crisp, MD;  Location: [facility] INVASIVE CV LAB;  Service: Cardiovascular;  Laterality: N/A;   SUBQ ICD IMPLANT N/A 11/26/2020   Procedure: SUBQ ICD IMPLANT;  Surgeon: Boyce Byes, MD;  Location: [facility] INVASIVE CV LAB;  Service: Cardiovascular;  Laterality: N/A;   Patient Active Problem List   Diagnosis Date Noted   Apraxia 10/05/2021   S/P ICD (internal cardiac defibrillator) procedure 11/27/2020   Hx of Cardiac arrest (HCC) 11/26/2020    Abnormality of gait 03/16/2020   Sleep disturbance 03/16/2020   Oropharyngeal dysphagia 02/12/2020   Coronary artery disease involving native coronary artery of native heart without angina pectoris 12/12/2019   Ischemic cardiomyopathy 12/12/2019   Brain injury (HCC)    Prediabetes    Anoxic brain injury (HCC) 11/05/2019   Dysphagia 11/05/2019   Physical deconditioning 11/05/2019   Acute delirium 11/05/2019   Respiratory failure (HCC)    Encounter for central line placement    Ventricular fibrillation (HCC) 09/23/2019   Acute combined systolic and diastolic heart failure (HCC) 09/23/2019   Ventricular tachycardia, polymorphic (HCC) 09/23/2019   Pelvic pain affecting pregnancy 10/15/2015   Supervision of normal pregnancy in third trimester 09/28/2015   Poor weight gain of pregnancy 09/28/2015   Iron  deficiency anemia of pregnancy 09/01/2015   Increased BMI (body mass index) 07/29/2015    ONSET DATE: August 2021  REFERRING DIAG:  G93.1 (ICD-10-CM) - Anoxic brain injury (HCC)  R48.2 (ICD-10-CM) - Apraxia    THERAPY DIAG:  Other lack of coordination  Difficulty in walking, not elsewhere classified  Muscle weakness (generalized)  Anoxic brain injury (HCC)  Unsteadiness on feet  Abnormality of gait and mobility  Other abnormalities of gait and mobility  Rationale for Evaluation and Treatment: Rehabilitation  SUBJECTIVE:  SUBJECTIVE STATEMENT:  Today: mother reports that she is doing okay. No new updates.       From Eval: Pt known to PT clinic. Previously seen for outpatient services March 2024. Pt was receiving HH PT until October 2024 per her mother's report. [name] reports pt using HHA for ambulation, was using RW but she says was told not to do to difficulty with safe  technique. Pt has fallen 1x in the past six months trying to ambulate on her own. Pt requires assistance with ADLs, including bathing and dressing. [name] reports pt never indicates any pain. Pt with severe aphasia so is unable to answer questions/pt's mom provides hx. She reports pt has impaired short-term memory, difficulty with stair climbing (has stair lift at home). She reports pt's BLE are weaker than they used to be. The pt is probably limited to <10 min with ambulation due to poor endurance.  Pt accompanied by: self, mother   PERTINENT HISTORY:   Pt known to PT clinic with the following hx per previous PT course:   " Patient is a 38 year old with diagnois of anoxic brain injury. She was receiving outpatient PT until Feb. 2022 last year but ran out of visits. Patient has past medical history of Anoxic Brain Injury 09/2019, CAD, HLD, HTN, ISCHEMIC CARDIMYOPATHY, STEMI, V-FIB, ICD on 11/29/2020  .On 09/23/19 patient presented to ED 2 weeks postpartum (had emergent C-Section) with chest pain. In ED sustained VFib Cardiac Arrest requiring intubation. EKG showed ST elevation, Cath Lab showed aneurysmal dissection of the LAD was identified. Course complicated by encephalopathy, anoxic brain injury, enterobacterial PNA. Patient is currently living with mom. Mother reports she is abel to walk some with a walker with supervision but primarily ambulates with HHA of mother who is her guardian. Mother also reports she requires assist for all transfers and ADL's. "  PAIN:  Are you having pain? No  PRECAUTIONS: Fall  WEIGHT BEARING RESTRICTIONS: No  FALLS: Has patient fallen in last 6 months? Yes. Number of falls 1  LIVING ENVIRONMENT: Lives with: lives with their family, mother Lives in: House/apartment Stairs:  has stairs but stair lift installed, pt using lift Has following equipment at home: Environmental consultant - 2 wheeled, Wheelchair (manual), shower chair, and Grab bars, pt uses stair lift  PLOF: Needs  assistance with ADLs  PATIENT GOALS: balance, walking, and pt states basketball  OBJECTIVE:  Note: Objective measures were completed at Evaluation unless otherwise noted.  DIAGNOSTIC FINDINGS:  None recent in chart  COGNITION: Overall cognitive status: impaired   SENSATION: Unable to complete testing due to impaired cognition, pt unable to report if sensation impairment present, pt mother unsure   COORDINATION: Pt unable to follow cues for testing  EDEMA:  Mom reports pt's feet do swell from time to time   Convergence: Impaired L eye  MUSCLE TONE: impaired bilat LE   POSTURE: difficulty assessing due to pt ataxic movement, typically observed to sacral sit in session   LOWER EXTREMITY MMT:  *accuracy of testing likely impacted by pt difficulty following cues  MMT Right Eval Left Eval  Hip flexion 4+ 4+  Hip extension    Hip abduction 4+ 4+  Hip adduction 4+ 4+  Hip internal rotation    Hip external rotation    Knee flexion 4+ 4+  Knee extension 5 5  Ankle dorsiflexion    Ankle plantarflexion    Ankle inversion    Ankle eversion    (Blank rows =  not tested)   TRANSFERS: HHA  tried in session, very unsteady, pt has difficulty with motor planning, little success with cues due to distraction, very unsafe Attempted in session with RW, pt unsteady, gait ataxic, continued significant difficulty with motor planning requiring up to min a + 2 and multi-modal cues to safely sit in chair.    STAIRS: Impaired per pt's mother GAIT: ataxic   FUNCTIONAL TESTS:  5 times sit to stand: 1 min 43 sec Timed up and go (TUG): 1 min 30 sec with RW 10 meter walk test: 0.25 m/s with RW extensive cuing to complete activity  PATIENT SURVEYS:  SIS-16 filled out by pt's mother, used as questions relevant to pt: 45                                                                                                                              TREATMENT DATE: 05/30/2023   Gait with RW  2x 159ft with min-mod assist for AD management to prevent veer to the R. Mod-max cues to keep BLE within RW with poor carryover. Constant cues to attend to task.   Sit<>stand with push with at least 1 UE from arm rest x 10. Mod-max instruction from PT for proper UE placement with only mild follow through from instruction. One near LOB, requiring max assist to prevent anterior LOB   Side stepping at rail 56ft x 4 bil. Mac cues for Use of BUE supported on rail and improved stepping sequence.   Foot tap on 2inch airex pad x 10 bil min assist oveall and mod-max cues for attention to task and position and stabilize  RW.   Standing march at rail x 6 bil with visual cues for posture and full ROM on BLE.   Seated hip flexion. X 10 bil with max cues for attention to task and full ROM.  Attempted LAQ, but unable to follow directions due to clinic distractions for more than 2 reps.   Stand pivot transfer back to transport chair with min assist and moderate cues for sequencing.   PATIENT EDUCATION: Education details: assessment findings, goals, plan, prognosis, transfer technique, safe use of AD Person educated: Patient and Parent Education method: Explanation, Demonstration, Tactile cues, and Verbal cues Education comprehension: returned demonstration, verbal cues required, tactile cues required, and needs further education  HOME EXERCISE PROGRAM: Access Code: [email or web address] URL: [email or web address] Date: 06/11/2023 Prepared by: Aurora Lees  Exercises - Standing March with Counter Support  - 1 x daily - 7 x weekly - 2 sets - 8 reps - Side Stepping with Counter Support  - 1 x daily - 7 x weekly - 2 sets - 4 reps - Sit to Stand with Armchair  - 1 x daily - 7 x weekly - 2 sets - 5 reps - Seated March  - 1 x daily - 7 x weekly - 3 sets - 10 reps  GOALS: Goals reviewed with patient? Yes   SHORT  TERM GOALS: Target date: 07/11/2023    Patient will be independent in home exercise  program to improve strength/mobility for better functional independence with ADLs. Baseline: Goal status: INITIAL   LONG TERM GOALS: Target date: 08/22/2023     1.  Patient will complete five times sit to stand test in < 35 seconds indicating an increased LE strength and improved balance. Baseline: previously 37 sec with UUE 04/19/22 today 05/30/23: 1 min 43 seconds Goal status: INITIAL  2. Patient will demo ability to ambulate > 400 ft w/ LRAD versus hand held assist min assist to demonstrate improved household mobility and short community distances.  Baseline: 4/28: 151ft with RW. Mod assist for AD management to prevent veer to the R.  Goal status: INITIAL  3.  Patient will increase 10 meter walk test to >1.3m/s as to improve gait speed for better community ambulation and to reduce fall risk. Baseline: previously 0.36 m/s on 04/19/22 : 0.25 m/s Goal status: INITIAL  4.  Patient will reduce timed up and go to <50 seconds to reduce fall risk and demonstrate improved transfer/gait ability Baseline: previously 91 sec with RW on 04/19/22; 05/30/23: 1 min 30 sec with RW  Goal status: INITIAL  5. Pt will exhibit correct and safe technique with stand>sit into a standard chair or transport chair to reduce risk of falling.   Baseline: pt current technique unsafe, poor motor planning  Goal status: INITIAL  6. The pt will demonstrate at least a 15 point improvement on SIS-16 to indicate increased ease with ADLs.  Baseline: 45 (filled out by pt's mother)  Goal status: INITIAL    ASSESSMENT:  CLINICAL IMPRESSION: Pt is a pleasant 39 y/o female seen today for PT treatment for impairments due to anoxic brain injury in 2021.  PT instructed pt in gait >179ft x 2 bouts with mod assist for AD management. Initiated HEP for functional movement patterns with hand out provided to mother. Improved sequencing for transfers ithroughout session including turn to the L to sit in amr chair. One near LOB  anteriorly due to rapid sit<>stand, requiring max assist to prevent anterior LOB.  The pt will benefit from further skilled PT to improve impairments in order to decrease fall risk and increase safety with mobility and ADLs.   OBJECTIVE IMPAIRMENTS: Abnormal gait, decreased activity tolerance, decreased balance, decreased coordination, decreased endurance, decreased knowledge of use of DME, decreased mobility, difficulty walking, decreased strength, decreased safety awareness, increased edema, impaired tone, improper body mechanics, and postural dysfunction.   ACTIVITY LIMITATIONS: carrying, lifting, bending, standing, squatting, stairs, transfers, bathing, dressing, locomotion level, and caring for others  PARTICIPATION LIMITATIONS: meal prep, cleaning, laundry, medication management, personal finances, driving, shopping, community activity, occupation, and yard work  PERSONAL FACTORS: Sex, Time since onset of injury/illness/exacerbation, and 1-2 comorbidities: per chart PMH includes CAD, HTN, ischemic cardiomyopathy, STEMI, ventricular fibrillation, dysphagia, s/p ICD, apraxia   are also affecting patient's functional outcome.   REHAB POTENTIAL: Fair    CLINICAL DECISION MAKING: Evolving/moderate complexity  EVALUATION COMPLEXITY: High  PLAN:  PT FREQUENCY: 1-2x/week  PT DURATION: 12 weeks  PLANNED INTERVENTIONS: 97164- PT Re-evaluation, 97750- Physical Performance Testing, 97110-Therapeutic exercises, 97530- Therapeutic activity, [id number]- Neuromuscular re-education, 97535- Self Care, [id number]- Manual therapy, [id number]- Gait training, [phone]- Orthotic Initial, [phone]- Orthotic/Prosthetic subsequent, [phone]- Canalith repositioning, Patient/Family education, Balance training, Stair training, Taping, Joint mobilization, Spinal mobilization, Vestibular training, DME instructions, Wheelchair mobility training, Cryotherapy, and Moist heat  PLAN FOR NEXT SESSION:   Balance training as  tolerated.    Barbara Book, PT 06/11/2023, 12:36 PM

## 2023-06-14 ENCOUNTER — Ambulatory Visit (INDEPENDENT_AMBULATORY_CARE_PROVIDER_SITE_OTHER): Payer: Medicaid Other

## 2023-06-14 ENCOUNTER — Ambulatory Visit: Admitting: Physical Therapy

## 2023-06-14 DIAGNOSIS — G931 Anoxic brain damage, not elsewhere classified: Secondary | ICD-10-CM

## 2023-06-14 DIAGNOSIS — R262 Difficulty in walking, not elsewhere classified: Secondary | ICD-10-CM

## 2023-06-14 DIAGNOSIS — R2689 Other abnormalities of gait and mobility: Secondary | ICD-10-CM

## 2023-06-14 DIAGNOSIS — M6281 Muscle weakness (generalized): Secondary | ICD-10-CM

## 2023-06-14 DIAGNOSIS — R278 Other lack of coordination: Secondary | ICD-10-CM | POA: Diagnosis not present

## 2023-06-14 DIAGNOSIS — I255 Ischemic cardiomyopathy: Secondary | ICD-10-CM | POA: Diagnosis not present

## 2023-06-14 DIAGNOSIS — R2681 Unsteadiness on feet: Secondary | ICD-10-CM

## 2023-06-14 DIAGNOSIS — R269 Unspecified abnormalities of gait and mobility: Secondary | ICD-10-CM

## 2023-06-14 NOTE — Therapy (Signed)
 OUTPATIENT PHYSICAL THERAPY NEURO TREATMENT   Patient Name: Beverly [name] MRN: [id number] DOB:[birth year], 38 y.o., female Today's Date: 06/14/2023   PCP: [facility] REFERRING PROVIDER: Rawland Caddy, MD  END OF SESSION:   PT End of Session - 06/14/23 1104     Visit Number 5    Number of Visits 25    Date for PT Re-Evaluation 08/22/23    PT Start Time 1105    PT Stop Time 1146    PT Time Calculation (min) 41 min    Equipment Utilized During Treatment Gait belt    Activity Tolerance Other (comment);Patient tolerated treatment well   difficulty attending to task   Behavior During Therapy Impulsive               Past Medical History:  Diagnosis Date   Brain injury [facility])    CAD (coronary artery disease)    HLD (hyperlipidemia)    Hypertension    last pregnancy   Ischemic cardiomyopathy    STEMI (ST elevation myocardial infarction) (HCC) 09/2019   SCAD with aneurysmal dilation of proximal LAD.   Vaginal Pap smear, abnormal    when she was 38yo   Ventricular fibrillation (HCC) 09/23/2019   Past Surgical History:  Procedure Laterality Date   CARDIAC CATHETERIZATION     IR GASTROSTOMY TUBE MOD SED  10/07/2019   IR GASTROSTOMY TUBE REMOVAL  07/02/2020   IR REPLC GASTRO/COLONIC TUBE PERCUT W/FLUORO  11/03/2021   IR REPLC GASTRO/COLONIC TUBE PERCUT W/FLUORO  03/09/2023   LEFT HEART CATH AND CORONARY ANGIOGRAPHY N/A 09/23/2019   Procedure: LEFT HEART CATH AND CORONARY ANGIOGRAPHY;  Surgeon: Sammy Crisp, MD;  Location: [facility] INVASIVE CV LAB;  Service: Cardiovascular;  Laterality: N/A;   SUBQ ICD IMPLANT N/A 11/26/2020   Procedure: SUBQ ICD IMPLANT;  Surgeon: Boyce Byes, MD;  Location: [facility] INVASIVE CV LAB;  Service: Cardiovascular;  Laterality: N/A;   Patient Active Problem List   Diagnosis Date Noted   Apraxia 10/05/2021   S/P ICD (internal cardiac defibrillator) procedure 11/27/2020   Hx of Cardiac arrest (HCC) 11/26/2020    Abnormality of gait 03/16/2020   Sleep disturbance 03/16/2020   Oropharyngeal dysphagia 02/12/2020   Coronary artery disease involving native coronary artery of native heart without angina pectoris 12/12/2019   Ischemic cardiomyopathy 12/12/2019   Brain injury (HCC)    Prediabetes    Anoxic brain injury (HCC) 11/05/2019   Dysphagia 11/05/2019   Physical deconditioning 11/05/2019   Acute delirium 11/05/2019   Respiratory failure (HCC)    Encounter for central line placement    Ventricular fibrillation (HCC) 09/23/2019   Acute combined systolic and diastolic heart failure (HCC) 09/23/2019   Ventricular tachycardia, polymorphic (HCC) 09/23/2019   Pelvic pain affecting pregnancy 10/15/2015   Supervision of normal pregnancy in third trimester 09/28/2015   Poor weight gain of pregnancy 09/28/2015   Iron  deficiency anemia of pregnancy 09/01/2015   Increased BMI (body mass index) 07/29/2015    ONSET DATE: August 2021  REFERRING DIAG:  G93.1 (ICD-10-CM) - Anoxic brain injury (HCC)  R48.2 (ICD-10-CM) - Apraxia    THERAPY DIAG:   Other lack of coordination  Difficulty in walking, not elsewhere classified  Muscle weakness (generalized)  Anoxic brain injury (HCC)  Unsteadiness on feet  Abnormality of gait and mobility  Other abnormalities of gait and mobility  Rationale for Evaluation and Treatment: Rehabilitation  SUBJECTIVE:  SUBJECTIVE STATEMENT:   [name], pt's dad, present with patient. Pt reports she is "fine." Denies pain to start session. Denies stumbles/falls since last therapy session.  Pt reports at home when she walks she holds someone's hand.   Pt reports her goal is to be able to use an AD to achieve mod-I mobility rather than relying on someone's HHA to ambulate.   From Eval:  Pt known to PT clinic. Previously seen for outpatient services March 2024. Pt was receiving HH PT until October 2024 per her mother's report. [name] reports pt using HHA for ambulation, was using RW but she says was told not to do to difficulty with safe technique. Pt has fallen 1x in the past six months trying to ambulate on her own. Pt requires assistance with ADLs, including bathing and dressing. [name] reports pt never indicates any pain. Pt with severe aphasia so is unable to answer questions/pt's mom provides hx. She reports pt has impaired short-term memory, difficulty with stair climbing (has stair lift at home). She reports pt's BLE are weaker than they used to be. The pt is probably limited to <10 min with ambulation due to poor endurance.  Pt accompanied by: self, father   PERTINENT HISTORY:   Pt known to PT clinic with the following hx per previous PT course:   " Patient is a 38 year old with diagnois of anoxic brain injury. She was receiving outpatient PT until Feb. 2022 last year but ran out of visits. Patient has past medical history of Anoxic Brain Injury 09/2019, CAD, HLD, HTN, ISCHEMIC CARDIMYOPATHY, STEMI, V-FIB, ICD on 11/29/2020  .On 09/23/19 patient presented to ED 2 weeks postpartum (had emergent C-Section) with chest pain. In ED sustained VFib Cardiac Arrest requiring intubation. EKG showed ST elevation, Cath Lab showed aneurysmal dissection of the LAD was identified. Course complicated by encephalopathy, anoxic brain injury, enterobacterial PNA. Patient is currently living with mom. Mother reports she is abel to walk some with a walker with supervision but primarily ambulates with HHA of mother who is her guardian. Mother also reports she requires assist for all transfers and ADL's. "  PAIN:  Are you having pain? No  PRECAUTIONS: Fall  WEIGHT BEARING RESTRICTIONS: No  FALLS: Has patient fallen in last 6 months? Yes. Number of falls 1  LIVING ENVIRONMENT: Lives  with: lives with their family, mother Lives in: House/apartment Stairs: has stairs but stair lift installed, pt using lift Has following equipment at home: Environmental consultant - 2 wheeled, Wheelchair (manual), shower chair, and Grab bars, pt uses stair lift  PLOF: Needs assistance with ADLs  PATIENT GOALS: balance, walking, and pt states basketball  OBJECTIVE:  Note: Objective measures were completed at Evaluation unless otherwise noted.  DIAGNOSTIC FINDINGS:  None recent in chart  COGNITION: Overall cognitive status: impaired   SENSATION: Unable to complete testing due to impaired cognition, pt unable to report if sensation impairment present, pt mother unsure   COORDINATION: Pt unable to follow cues for testing  EDEMA:  Mom reports pt's feet do swell from time to time   Convergence: Impaired L eye  MUSCLE TONE: impaired bilat LE   POSTURE: difficulty assessing due to pt ataxic movement, typically observed to sacral sit in session   LOWER EXTREMITY MMT:  *accuracy of testing likely impacted by pt difficulty following cues  MMT Right Eval Left Eval  Hip flexion 4+ 4+  Hip extension    Hip abduction 4+ 4+  Hip adduction 4+ 4+  Hip internal rotation    Hip external rotation    Knee flexion 4+ 4+  Knee extension 5 5  Ankle dorsiflexion    Ankle plantarflexion    Ankle inversion    Ankle eversion    (Blank rows = not tested)   TRANSFERS: HHA  tried in session, very unsteady, pt has difficulty with motor planning, little success with cues due to distraction, very unsafe Attempted in session with RW, pt unsteady, gait ataxic, continued significant difficulty with motor planning requiring up to min a + 2 and multi-modal cues to safely sit in chair.    STAIRS: Impaired per pt's mother GAIT: ataxic   FUNCTIONAL TESTS:  5 times sit to stand: 1 min 43 sec Timed up and go (TUG): 1 min 30 sec with RW 10 meter walk test: 0.25 m/s with RW extensive cuing to complete  activity  PATIENT SURVEYS:  SIS-16 filled out by pt's mother, used as questions relevant to pt: 45                                                                                                                              TREATMENT DATE: 06/14/2023  Pt arrives in transport chair.   L stand pivot transpot chair>green chair with skilled min/mod A for balance and therapist providing verbal/visual/tactile cuing for sequencing.    Sit>stand green chair>RW with skilled min A for balance - with question-cuing pt able to recall need to push-up from seat when coming to stand throughout session.   Gait training ~118ft x2 using RW with skilled min/light mod assist for balance and AD management. Pt demonstrating the following gait deviations with therapist providing the described cuing and facilitation for improvement:  - pt frequently pushing AD too far forward with anterior trunk lean   - responds well to cue of "stay inside walker" and pt even able to state at end of gait this was the primary cue for her - pt easily distracted by items in environment - pt with primarily reciprocal stepping pattern; however, L foot occasionally kicking back L leg of RW with excessive hip ER - on 2nd walk therapist trying to only provide skilled min A with primarily question cuing to draw patient's attention to impaired AD management  - noticed pt has AD veer more towards R and L LE is more abducted  When going to sit in chair at end of gait, requires increased cuing for sequencing turning and doesn't understand cue to bring AD back close to her prior to sitting.   Attempted block practice stand pivot transfers from green chair to green chair with armrests ~59ft away using RW with focus on turning fully, bringing RW back with her, and then reaching back for chair; however, pt unable to consistently remember to bring AD back and reach back. Notice possible tone or impaired motor planning in L UE keeping it in extensor  pattern.  Dynamic gait training ~167ft using RW  with 3 large obstacles for pt to navigate around in hallway in less distracting environment - performs last second adjustments to avoid obstacles - continues to require mod verbal cuing for correct use of AD  Continues to require max cuing and increased assist to turn, keeping AD close, when going to sit in transport chair at end of gait.    PATIENT EDUCATION: Education details: assessment findings, goals, plan, prognosis, transfer technique, safe use of AD Person educated: Patient and Parent Education method: Explanation, Demonstration, Tactile cues, and Verbal cues Education comprehension: returned demonstration, verbal cues required, tactile cues required, and needs further education  HOME EXERCISE PROGRAM: Access Code: [email or web address] URL: [email or web address] Date: 06/11/2023 Prepared by: Aurora Lees  Exercises - Standing March with Counter Support  - 1 x daily - 7 x weekly - 2 sets - 8 reps - Side Stepping with Counter Support  - 1 x daily - 7 x weekly - 2 sets - 4 reps - Sit to Stand with Armchair  - 1 x daily - 7 x weekly - 2 sets - 5 reps - Seated March  - 1 x daily - 7 x weekly - 3 sets - 10 reps  GOALS: Goals reviewed with patient? Yes   SHORT TERM GOALS: Target date: 07/11/2023   Patient will be independent in home exercise program to improve strength/mobility for better functional independence with ADLs. Baseline: Goal status: INITIAL   LONG TERM GOALS: Target date: 08/22/2023   1.  Patient will complete five times sit to stand test in < 35 seconds indicating an increased LE strength and improved balance. Baseline: previously 37 sec with UUE 04/19/22 today 05/30/23: 1 min 43 seconds Goal status: INITIAL  2. Patient will demo ability to ambulate > 400 ft w/ LRAD versus hand held assist min assist to demonstrate improved household mobility and short community distances.  Baseline: 4/28: 173ft with RW. Mod  assist for AD management to prevent veer to the R.  Goal status: INITIAL  3.  Patient will increase 10 meter walk test to >1.2m/s as to improve gait speed for better community ambulation and to reduce fall risk. Baseline: previously 0.36 m/s on 04/19/22 : 0.25 m/s Goal status: INITIAL  4.  Patient will reduce timed up and go to <50 seconds to reduce fall risk and demonstrate improved transfer/gait ability Baseline: previously 91 sec with RW on 04/19/22; 05/30/23: 1 min 30 sec with RW  Goal status: INITIAL  5. Pt will exhibit correct and safe technique with stand>sit into a standard chair or transport chair to reduce risk of falling.   Baseline: pt current technique unsafe, poor motor planning  Goal status: INITIAL  6. The pt will demonstrate at least a 15 point improvement on SIS-16 to indicate increased ease with ADLs.  Baseline: 45 (filled out by pt's mother)  Goal status: INITIAL    ASSESSMENT:  CLINICAL IMPRESSION:  Pt is a pleasant 38 y/o female seen today for PT treatment for impairments due to anoxic brain injury in 2021. Therapy session focused on gait training with use of RW with slight improvement noted on 2nd gait trail; however, pt continues to require mod/max verbal cuing to attend to AD management as she tends to push it too far forward outside her BOS with bias towards R. Pt does demo some learning with ability to verbally state what she needs to improve upon with mobility tasks when asked afterwards; however, has decreased ability to implement those corrections when performing the mobility task  without cuing. Attempted block practice transfer training to improve safety when turning to sit at the end of gait; however, no carryover noticed during session. Ms. Mulberry will benefit from further skilled PT to improve the above impairments in order to decrease fall risk and increase safety with mobility and ADLs.   OBJECTIVE IMPAIRMENTS: Abnormal gait, decreased activity tolerance,  decreased balance, decreased coordination, decreased endurance, decreased knowledge of use of DME, decreased mobility, difficulty walking, decreased strength, decreased safety awareness, increased edema, impaired tone, improper body mechanics, and postural dysfunction.   ACTIVITY LIMITATIONS: carrying, lifting, bending, standing, squatting, stairs, transfers, bathing, dressing, locomotion level, and caring for others  PARTICIPATION LIMITATIONS: meal prep, cleaning, laundry, medication management, personal finances, driving, shopping, community activity, occupation, and yard work  PERSONAL FACTORS: Sex, Time since onset of injury/illness/exacerbation, and 1-2 comorbidities: per chart PMH includes CAD, HTN, ischemic cardiomyopathy, STEMI, ventricular fibrillation, dysphagia, s/p ICD, apraxia  are also affecting patient's functional outcome.   REHAB POTENTIAL: Fair    CLINICAL DECISION MAKING: Evolving/moderate complexity  EVALUATION COMPLEXITY: High  PLAN:  PT FREQUENCY: 1-2x/week  PT DURATION: 12 weeks  PLANNED INTERVENTIONS: 97164- PT Re-evaluation, 97750- Physical Performance Testing, 97110-Therapeutic exercises, 97530- Therapeutic activity, [id number]- Neuromuscular re-education, 97535- Self Care, [id number]- Manual therapy, [id number]- Gait training, [phone]- Orthotic Initial, [phone]- Orthotic/Prosthetic subsequent, [phone]- Canalith repositioning, Patient/Family education, Balance training, Stair training, Taping, Joint mobilization, Spinal mobilization, Vestibular training, DME instructions, Wheelchair mobility training, Cryotherapy, and Moist heat  PLAN FOR NEXT SESSION:  - try use of Up-Walker? Due to tendency to extend elbows and push AD too far forward - focus on increased intensity of exercise to see if this can promote neural healing for improved learning   - high intensity gait training with/without AD - focus on question cuing to improve pt's ability to identify errors in her motor  plans    Carlen Chasten, PT, DPT, NCS, CSRS Physical Therapist - [facility]  [facility]  11:49 AM 06/14/23

## 2023-06-15 ENCOUNTER — Other Ambulatory Visit: Payer: Self-pay | Admitting: Cardiology

## 2023-06-15 ENCOUNTER — Other Ambulatory Visit: Payer: Self-pay | Admitting: *Deleted

## 2023-06-15 MED ORDER — TRANSDERM-SCOP 1 MG/3DAYS TD PT72: 1.0000 | MEDICATED_PATCH | TRANSDERMAL | 1 refills | Status: DC

## 2023-06-18 ENCOUNTER — Ambulatory Visit: Admitting: Physical Therapy

## 2023-06-18 DIAGNOSIS — R278 Other lack of coordination: Secondary | ICD-10-CM

## 2023-06-18 DIAGNOSIS — R262 Difficulty in walking, not elsewhere classified: Secondary | ICD-10-CM

## 2023-06-18 DIAGNOSIS — M6281 Muscle weakness (generalized): Secondary | ICD-10-CM

## 2023-06-18 DIAGNOSIS — R2689 Other abnormalities of gait and mobility: Secondary | ICD-10-CM

## 2023-06-18 DIAGNOSIS — G931 Anoxic brain damage, not elsewhere classified: Secondary | ICD-10-CM

## 2023-06-18 DIAGNOSIS — R269 Unspecified abnormalities of gait and mobility: Secondary | ICD-10-CM

## 2023-06-18 DIAGNOSIS — R2681 Unsteadiness on feet: Secondary | ICD-10-CM

## 2023-06-18 LAB — CUP PACEART REMOTE DEVICE CHECK
Battery Remaining Percentage: 66 %
Date Time Interrogation Session: 20250508071100
HighPow Impedance: 145 Ohm
Implantable Pulse Generator Implant Date: 20221021
Pulse Gen Serial Number: 161805

## 2023-06-18 NOTE — Therapy (Signed)
 OUTPATIENT PHYSICAL THERAPY NEURO TREATMENT   Patient Name: Beverly [name] MRN: [id number] DOB:1985-03-27, 38 y.o., female Today's Date: 06/18/2023   PCP: [facility] REFERRING PROVIDER: Rawland Caddy, MD  END OF SESSION:   PT End of Session - 06/18/23 1131     Visit Number 6    Number of Visits 25    Date for PT Re-Evaluation 08/22/23    PT Start Time 1145    PT Stop Time 1225    PT Time Calculation (min) 40 min    Equipment Utilized During Treatment Gait belt    Activity Tolerance Other (comment);Patient tolerated treatment well   difficulty attending to task   Behavior During Therapy Impulsive               Past Medical History:  Diagnosis Date   Brain injury [facility])    CAD (coronary artery disease)    HLD (hyperlipidemia)    Hypertension    last pregnancy   Ischemic cardiomyopathy    STEMI (ST elevation myocardial infarction) (HCC) 09/2019   SCAD with aneurysmal dilation of proximal LAD.   Vaginal Pap smear, abnormal    when she was 38yo   Ventricular fibrillation (HCC) 09/23/2019   Past Surgical History:  Procedure Laterality Date   CARDIAC CATHETERIZATION     IR GASTROSTOMY TUBE MOD SED  10/07/2019   IR GASTROSTOMY TUBE REMOVAL  07/02/2020   IR REPLC GASTRO/COLONIC TUBE PERCUT W/FLUORO  11/03/2021   IR REPLC GASTRO/COLONIC TUBE PERCUT W/FLUORO  03/09/2023   LEFT HEART CATH AND CORONARY ANGIOGRAPHY N/A 09/23/2019   Procedure: LEFT HEART CATH AND CORONARY ANGIOGRAPHY;  Surgeon: Sammy Crisp, MD;  Location: [facility] INVASIVE CV LAB;  Service: Cardiovascular;  Laterality: N/A;   SUBQ ICD IMPLANT N/A 11/26/2020   Procedure: SUBQ ICD IMPLANT;  Surgeon: Boyce Byes, MD;  Location: [facility] INVASIVE CV LAB;  Service: Cardiovascular;  Laterality: N/A;   Patient Active Problem List   Diagnosis Date Noted   Apraxia 10/05/2021   S/P ICD (internal cardiac defibrillator) procedure 11/27/2020   Hx of Cardiac arrest (HCC) 11/26/2020    Abnormality of gait 03/16/2020   Sleep disturbance 03/16/2020   Oropharyngeal dysphagia 02/12/2020   Coronary artery disease involving native coronary artery of native heart without angina pectoris 12/12/2019   Ischemic cardiomyopathy 12/12/2019   Brain injury (HCC)    Prediabetes    Anoxic brain injury (HCC) 11/05/2019   Dysphagia 11/05/2019   Physical deconditioning 11/05/2019   Acute delirium 11/05/2019   Respiratory failure (HCC)    Encounter for central line placement    Ventricular fibrillation (HCC) 09/23/2019   Acute combined systolic and diastolic heart failure (HCC) 09/23/2019   Ventricular tachycardia, polymorphic (HCC) 09/23/2019   Pelvic pain affecting pregnancy 10/15/2015   Supervision of normal pregnancy in third trimester 09/28/2015   Poor weight gain of pregnancy 09/28/2015   Iron  deficiency anemia of pregnancy 09/01/2015   Increased BMI (body mass index) 07/29/2015    ONSET DATE: August 2021  REFERRING DIAG:  G93.1 (ICD-10-CM) - Anoxic brain injury (HCC)  R48.2 (ICD-10-CM) - Apraxia    THERAPY DIAG:   Other lack of coordination  Difficulty in walking, not elsewhere classified  Muscle weakness (generalized)  Anoxic brain injury (HCC)  Unsteadiness on feet  Abnormality of gait and mobility  Other abnormalities of gait and mobility  Rationale for Evaluation and Treatment: Rehabilitation  SUBJECTIVE:  SUBJECTIVE STATEMENT:   Pt arrived with mother. No updates.      From Eval: Pt known to PT clinic. Previously seen for outpatient services March 2024. Pt was receiving HH PT until October 2024 per her mother's report. [name] reports pt using HHA for ambulation, was using RW but she says was told not to do to difficulty with safe technique. Pt has fallen 1x in  the past six months trying to ambulate on her own. Pt requires assistance with ADLs, including bathing and dressing. [name] reports pt never indicates any pain. Pt with severe aphasia so is unable to answer questions/pt's mom provides hx. She reports pt has impaired short-term memory, difficulty with stair climbing (has stair lift at home). She reports pt's BLE are weaker than they used to be. The pt is probably limited to <10 min with ambulation due to poor endurance.  Pt reports her goal is to be able to use an AD to achieve mod-I mobility rather than relying on someone's HHA to ambulate.  Pt accompanied by: self, father   PERTINENT HISTORY:   Pt known to PT clinic with the following hx per previous PT course:   " Patient is a 38 year old with diagnois of anoxic brain injury. She was receiving outpatient PT until Feb. 2022 last year but ran out of visits. Patient has past medical history of Anoxic Brain Injury 09/2019, CAD, HLD, HTN, ISCHEMIC CARDIMYOPATHY, STEMI, V-FIB, ICD on 11/29/2020  .On 09/23/19 patient presented to ED 2 weeks postpartum (had emergent C-Section) with chest pain. In ED sustained VFib Cardiac Arrest requiring intubation. EKG showed ST elevation, Cath Lab showed aneurysmal dissection of the LAD was identified. Course complicated by encephalopathy, anoxic brain injury, enterobacterial PNA. Patient is currently living with mom. Mother reports she is abel to walk some with a walker with supervision but primarily ambulates with HHA of mother who is her guardian. Mother also reports she requires assist for all transfers and ADL's. "  PAIN:  Are you having pain? No  PRECAUTIONS: Fall  WEIGHT BEARING RESTRICTIONS: No  FALLS: Has patient fallen in last 6 months? Yes. Number of falls 1  LIVING ENVIRONMENT: Lives with: lives with their family, mother Lives in: House/apartment Stairs: has stairs but stair lift installed, pt using lift Has following equipment at home: Environmental consultant -  2 wheeled, Wheelchair (manual), shower chair, and Grab bars, pt uses stair lift  PLOF: Needs assistance with ADLs  PATIENT GOALS: balance, walking, and pt states basketball  OBJECTIVE:  Note: Objective measures were completed at Evaluation unless otherwise noted.  DIAGNOSTIC FINDINGS:  None recent in chart  COGNITION: Overall cognitive status: impaired   SENSATION: Unable to complete testing due to impaired cognition, pt unable to report if sensation impairment present, pt mother unsure   COORDINATION: Pt unable to follow cues for testing  EDEMA:  Mom reports pt's feet do swell from time to time   Convergence: Impaired L eye  MUSCLE TONE: impaired bilat LE   POSTURE: difficulty assessing due to pt ataxic movement, typically observed to sacral sit in session   LOWER EXTREMITY MMT:  *accuracy of testing likely impacted by pt difficulty following cues  MMT Right Eval Left Eval  Hip flexion 4+ 4+  Hip extension    Hip abduction 4+ 4+  Hip adduction 4+ 4+  Hip internal rotation    Hip external rotation    Knee flexion 4+ 4+  Knee extension 5 5  Ankle dorsiflexion  Ankle plantarflexion    Ankle inversion    Ankle eversion    (Blank rows = not tested)   TRANSFERS: HHA  tried in session, very unsteady, pt has difficulty with motor planning, little success with cues due to distraction, very unsafe Attempted in session with RW, pt unsteady, gait ataxic, continued significant difficulty with motor planning requiring up to min a + 2 and multi-modal cues to safely sit in chair.    STAIRS: Impaired per pt's mother GAIT: ataxic   FUNCTIONAL TESTS:  5 times sit to stand: 1 min 43 sec Timed up and go (TUG): 1 min 30 sec with RW 10 meter walk test: 0.25 m/s with RW extensive cuing to complete activity  PATIENT SURVEYS:  SIS-16 filled out by pt's mother, used as questions relevant to pt: 45                                                                                                                               TREATMENT DATE: 06/14/2023  Pt arrives in transport chair.   stand pivot transpot chair>green chair to the R with skilled min A for balance and therapist providing mod verbal/visual/tactile cuing for sequencing.    Sit<>stand from arm chair with min assist throughout session and moderate cues to push with 1 arm rest throughout session.   Gait with RW x 4-25ft to and from rail at wall x 4 throughout session min/mod assist for posture, Safety and AD management and mod/max cues for sequencing to turn walker 180 deg, turn feet. Very receptive to cues to turn walker, then turn feet.  Forward/reverse 4-20ft x 2 with min assist and mod-max assist for AD management and moderate cues for sequencing to step BLE "back" and then move RW.    Sit>stand green chair>RW with skilled min A for balance - with question-cuing pt able to recall need to push-up from seat when coming to stand throughout session.   Gait training ~144ft using RW with skilled min/light mod assist for balance and AD management to prevent excessive LUE extension. PT assisted from the L side today with improved control of AD noted as well improved attention to gait pattern and ability to remain "inside RW"  Less distractions noted in clinic on this day allowing improved attention to task.    Standing balance/attention at rail:  Standing on airex pad  Reciprocal target touch x 12 bil with moderate cues for 1 step commands Moving magnets to target on board at midline x 6 bil; 1 UE support  1 foot on 6 inch step, cross body reach to move target x 6 bil contralateral UE supported on rail.   For all standing tasks. Max single step instructions for all tasks, but good carry over once pt able to attend to task. As well as hand over hand assist when moving magnets due to coordination deficits LUE>RUE.  Returned to to transport chair with  Continues to require mod  cuing and increased assist to turn,  keeping AD close, when going to sit in transport chair at end of gait.    PATIENT EDUCATION: Education details: assessment findings, goals, plan, prognosis, transfer technique, safe use of AD Person educated: Patient and Parent Education method: Explanation, Demonstration, Tactile cues, and Verbal cues Education comprehension: returned demonstration, verbal cues required, tactile cues required, and needs further education  HOME EXERCISE PROGRAM: Access Code: [email or web address] URL: [email or web address] Date: 06/11/2023 Prepared by: Aurora Lees  Exercises - Standing March with Counter Support  - 1 x daily - 7 x weekly - 2 sets - 8 reps - Side Stepping with Counter Support  - 1 x daily - 7 x weekly - 2 sets - 4 reps - Sit to Stand with Armchair  - 1 x daily - 7 x weekly - 2 sets - 5 reps - Seated March  - 1 x daily - 7 x weekly - 3 sets - 10 reps  GOALS: Goals reviewed with patient? Yes   SHORT TERM GOALS: Target date: 07/11/2023   Patient will be independent in home exercise program to improve strength/mobility for better functional independence with ADLs. Baseline: Goal status: INITIAL   LONG TERM GOALS: Target date: 08/22/2023   1.  Patient will complete five times sit to stand test in < 35 seconds indicating an increased LE strength and improved balance. Baseline: previously 37 sec with UUE 04/19/22 today 05/30/23: 1 min 43 seconds Goal status: INITIAL  2. Patient will demo ability to ambulate > 400 ft w/ LRAD versus hand held assist min assist to demonstrate improved household mobility and short community distances.  Baseline: 4/28: 152ft with RW. Mod assist for AD management to prevent veer to the R.  Goal status: INITIAL  3.  Patient will increase 10 meter walk test to >1.5m/s as to improve gait speed for better community ambulation and to reduce fall risk. Baseline: previously 0.36 m/s on 04/19/22 : 0.25 m/s Goal status: INITIAL  4.  Patient will reduce timed up  and go to <50 seconds to reduce fall risk and demonstrate improved transfer/gait ability Baseline: previously 91 sec with RW on 04/19/22; 05/30/23: 1 min 30 sec with RW  Goal status: INITIAL  5. Pt will exhibit correct and safe technique with stand>sit into a standard chair or transport chair to reduce risk of falling.   Baseline: pt current technique unsafe, poor motor planning  Goal status: INITIAL  6. The pt will demonstrate at least a 15 point improvement on SIS-16 to indicate increased ease with ADLs.  Baseline: 45 (filled out by pt's mother)  Goal status: INITIAL    ASSESSMENT:  CLINICAL IMPRESSION:  Pt is a pleasant 38 y/o female seen today for PT treatment for impairments due to anoxic brain injury in 2021. PT treatment focused on attention to task and motor planning for gait, turns, and "backing up" to chair. Pt demonstrated improved carryover from one set to the next as well as improved attention to task. On this day allowing improved AD control with gait as well as sequencing for turns.  Beverly [name] will benefit from further skilled PT to improve the above impairments in order to decrease fall risk and increase safety with mobility and ADLs.   OBJECTIVE IMPAIRMENTS: Abnormal gait, decreased activity tolerance, decreased balance, decreased coordination, decreased endurance, decreased knowledge of use of DME, decreased mobility, difficulty walking, decreased strength, decreased safety awareness, increased edema, impaired tone, improper body mechanics, and postural dysfunction.   ACTIVITY LIMITATIONS:  carrying, lifting, bending, standing, squatting, stairs, transfers, bathing, dressing, locomotion level, and caring for others  PARTICIPATION LIMITATIONS: meal prep, cleaning, laundry, medication management, personal finances, driving, shopping, community activity, occupation, and yard work  PERSONAL FACTORS: Sex, Time since onset of injury/illness/exacerbation, and 1-2 comorbidities: per  chart PMH includes CAD, HTN, ischemic cardiomyopathy, STEMI, ventricular fibrillation, dysphagia, s/p ICD, apraxia  are also affecting patient's functional outcome.   REHAB POTENTIAL: Fair    CLINICAL DECISION MAKING: Evolving/moderate complexity  EVALUATION COMPLEXITY: High  PLAN:  PT FREQUENCY: 1-2x/week  PT DURATION: 12 weeks  PLANNED INTERVENTIONS: 97164- PT Re-evaluation, 97750- Physical Performance Testing, 97110-Therapeutic exercises, 97530- Therapeutic activity, 97112- Neuromuscular re-education, 97535- Self Care, [id number]- Manual therapy, [phone]- Gait training, [phone]- Orthotic Initial, [phone]- Orthotic/Prosthetic subsequent, [phone]- Canalith repositioning, Patient/Family education, Balance training, Stair training, Taping, Joint mobilization, Spinal mobilization, Vestibular training, DME instructions, Wheelchair mobility training, Cryotherapy, and Moist heat  PLAN FOR NEXT SESSION:   - focus on increased intensity of exercise to see if this can promote neural healing for improved learning   - high intensity gait training with/without AD - focus on question cuing to improve pt's ability to identify errors in her motor plans    Aurora Lees PT, DPT  Physical Therapist - [facility]  [facility]  2:22 PM 06/18/23

## 2023-06-18 NOTE — Telephone Encounter (Signed)
 Can you fix this? They are unable to fill it because it has missing important parts of the prescription. For some reason it will not allow me to edit it.

## 2023-06-21 ENCOUNTER — Ambulatory Visit: Admitting: Physical Therapy

## 2023-06-21 DIAGNOSIS — R278 Other lack of coordination: Secondary | ICD-10-CM | POA: Diagnosis not present

## 2023-06-21 DIAGNOSIS — R269 Unspecified abnormalities of gait and mobility: Secondary | ICD-10-CM

## 2023-06-21 DIAGNOSIS — R2689 Other abnormalities of gait and mobility: Secondary | ICD-10-CM

## 2023-06-21 DIAGNOSIS — M6281 Muscle weakness (generalized): Secondary | ICD-10-CM

## 2023-06-21 DIAGNOSIS — G931 Anoxic brain damage, not elsewhere classified: Secondary | ICD-10-CM

## 2023-06-21 DIAGNOSIS — R2681 Unsteadiness on feet: Secondary | ICD-10-CM

## 2023-06-21 DIAGNOSIS — R262 Difficulty in walking, not elsewhere classified: Secondary | ICD-10-CM

## 2023-06-21 NOTE — Therapy (Signed)
 OUTPATIENT PHYSICAL THERAPY NEURO TREATMENT   Patient Name: Beverly [name] MRN: [id number] DOB:1985/05/25, 38 y.o., female Today's Date: 06/21/2023   PCP: Beverly [name] REFERRING PROVIDER: Rawland Caddy, MD  END OF SESSION:   PT End of Session - 06/21/23 1018     Visit Number 7    Number of Visits 25    Date for PT Re-Evaluation 08/22/23    PT Start Time 1018    PT Stop Time 1058    PT Time Calculation (min) 40 min    Equipment Utilized During Treatment Gait belt    Activity Tolerance Other (comment);Patient tolerated treatment well   difficulty attending to tasks, easily distractable   Behavior During Therapy Impulsive                Past Medical History:  Diagnosis Date   Brain injury Beverly [name])    CAD (coronary artery disease)    HLD (hyperlipidemia)    Hypertension    last pregnancy   Ischemic cardiomyopathy    STEMI (ST elevation myocardial infarction) (HCC) 09/2019   SCAD with aneurysmal dilation of proximal LAD.   Vaginal Pap smear, abnormal    when she was 38yo   Ventricular fibrillation (HCC) 09/23/2019   Past Surgical History:  Procedure Laterality Date   CARDIAC CATHETERIZATION     IR GASTROSTOMY TUBE MOD SED  10/07/2019   IR GASTROSTOMY TUBE REMOVAL  07/02/2020   IR REPLC GASTRO/COLONIC TUBE PERCUT W/FLUORO  11/03/2021   IR REPLC GASTRO/COLONIC TUBE PERCUT W/FLUORO  03/09/2023   LEFT HEART CATH AND CORONARY ANGIOGRAPHY N/A 09/23/2019   Procedure: LEFT HEART CATH AND CORONARY ANGIOGRAPHY;  Surgeon: Beverly Crisp, MD;  Location: [facility] INVASIVE CV LAB;  Service: Cardiovascular;  Laterality: N/A;   SUBQ ICD IMPLANT N/A 11/26/2020   Procedure: SUBQ ICD IMPLANT;  Surgeon: Beverly Byes, MD;  Location: [facility] INVASIVE CV LAB;  Service: Cardiovascular;  Laterality: N/A;   Patient Active Problem List   Diagnosis Date Noted   Apraxia 10/05/2021   S/P ICD (internal cardiac defibrillator) procedure 11/27/2020   Hx of Cardiac arrest  (HCC) 11/26/2020   Abnormality of gait 03/16/2020   Sleep disturbance 03/16/2020   Oropharyngeal dysphagia 02/12/2020   Coronary artery disease involving native coronary artery of native heart without angina pectoris 12/12/2019   Ischemic cardiomyopathy 12/12/2019   Brain injury (HCC)    Prediabetes    Anoxic brain injury (HCC) 11/05/2019   Dysphagia 11/05/2019   Physical deconditioning 11/05/2019   Acute delirium 11/05/2019   Respiratory failure (HCC)    Encounter for central line placement    Ventricular fibrillation (HCC) 09/23/2019   Acute combined systolic and diastolic heart failure (HCC) 09/23/2019   Ventricular tachycardia, polymorphic (HCC) 09/23/2019   Pelvic pain affecting pregnancy 10/15/2015   Supervision of normal pregnancy in third trimester 09/28/2015   Poor weight gain of pregnancy 09/28/2015   Iron  deficiency anemia of pregnancy 09/01/2015   Increased BMI (body mass index) 07/29/2015    ONSET DATE: August 2021  REFERRING DIAG:  G93.1 (ICD-10-CM) - Anoxic brain injury (HCC)  R48.2 (ICD-10-CM) - Apraxia    THERAPY DIAG:   Other lack of coordination  Difficulty in walking, not elsewhere classified  Muscle weakness (generalized)  Anoxic brain injury (HCC)  Unsteadiness on feet  Abnormality of gait and mobility  Other abnormalities of gait and mobility  Rationale for Evaluation and Treatment: Rehabilitation  SUBJECTIVE:  SUBJECTIVE STATEMENT:   Pt arrived with father, Beverly [name]. Pt denies pain to start session. Denies stumbles/falls.   From Eval: Pt known to PT clinic. Previously seen for outpatient services March 2024. Pt was receiving HH PT until October 2024 per her mother's report. [name] reports pt using HHA for ambulation, was using RW but she says was told  not to do to difficulty with safe technique. Pt has fallen 1x in the past six months trying to ambulate on her own. Pt requires assistance with ADLs, including bathing and dressing. [name] reports pt never indicates any pain. Pt with severe aphasia so is unable to answer questions/pt's mom provides hx. She reports pt has impaired short-term memory, difficulty with stair climbing (has stair lift at home). She reports pt's BLE are weaker than they used to be. The pt is probably limited to <10 min with ambulation due to poor endurance.  Pt reports her goal is to be able to use an AD to achieve mod-I mobility rather than relying on someone's HHA to ambulate.  Pt accompanied by: self, father   PERTINENT HISTORY:   Pt known to PT clinic with the following hx per previous PT course:   " Patient is a 38 year old with diagnois of anoxic brain injury. She was receiving outpatient PT until Feb. 2022 last year but ran out of visits. Patient has past medical history of Anoxic Brain Injury 09/2019, CAD, HLD, HTN, ISCHEMIC CARDIMYOPATHY, STEMI, V-FIB, ICD on 11/29/2020  .On 09/23/19 patient presented to ED 2 weeks postpartum (had emergent C-Section) with chest pain. In ED sustained VFib Cardiac Arrest requiring intubation. EKG showed ST elevation, Cath Lab showed aneurysmal dissection of the LAD was identified. Course complicated by encephalopathy, anoxic brain injury, enterobacterial PNA. Patient is currently living with mom. Mother reports she is abel to walk some with a walker with supervision but primarily ambulates with HHA of mother who is her guardian. Mother also reports she requires assist for all transfers and ADL's. "  PAIN:  Are you having pain? No  PRECAUTIONS: Fall  WEIGHT BEARING RESTRICTIONS: No  FALLS: Has patient fallen in last 6 months? Yes. Number of falls 1  LIVING ENVIRONMENT: Lives with: lives with their family, mother Lives in: House/apartment Stairs: has stairs but stair lift  installed, pt using lift Has following equipment at home: Environmental consultant - 2 wheeled, Wheelchair (manual), shower chair, and Grab bars, pt uses stair lift  PLOF: Needs assistance with ADLs  PATIENT GOALS: balance, walking, and pt states basketball  OBJECTIVE:  Note: Objective measures were completed at Evaluation unless otherwise noted.  DIAGNOSTIC FINDINGS:  None recent in chart  COGNITION: Overall cognitive status: impaired   SENSATION: Unable to complete testing due to impaired cognition, pt unable to report if sensation impairment present, pt mother unsure   COORDINATION: Pt unable to follow cues for testing  EDEMA:  Mom reports pt's feet do swell from time to time   Convergence: Impaired L eye  MUSCLE TONE: impaired bilat LE   POSTURE: difficulty assessing due to pt ataxic movement, typically observed to sacral sit in session   LOWER EXTREMITY MMT:  *accuracy of testing likely impacted by pt difficulty following cues  MMT Right Eval Left Eval  Hip flexion 4+ 4+  Hip extension    Hip abduction 4+ 4+  Hip adduction 4+ 4+  Hip internal rotation    Hip external rotation    Knee flexion 4+ 4+  Knee extension 5 5  Ankle dorsiflexion    Ankle plantarflexion    Ankle inversion    Ankle eversion    (Blank rows = not tested)   TRANSFERS: HHA  tried in session, very unsteady, pt has difficulty with motor planning, little success with cues due to distraction, very unsafe Attempted in session with RW, pt unsteady, gait ataxic, continued significant difficulty with motor planning requiring up to min a + 2 and multi-modal cues to safely sit in chair.    STAIRS: Impaired per pt's mother GAIT: ataxic   FUNCTIONAL TESTS:  5 times sit to stand: 1 min 43 sec Timed up and go (TUG): 1 min 30 sec with RW 10 meter walk test: 0.25 m/s with RW extensive cuing to complete activity  PATIENT SURVEYS:  SIS-16 filled out by pt's mother, used as questions relevant to pt: 45                                                                                                                               TREATMENT DATE: 06/21/2023  Pt arrives in transport chair.   Stand pivot transport chair>green chair to the L with skilled min A for balance and therapist providing only min verbal/visual/tactile cuing for sequencing - provided R HHA.  Sit<>stands from arm chair with min assist throughout session and consistent question cuing to remember to push up through the arm rests.   Gait training ~174ft using RW with skilled min A for balance and AD management as well as mod/max verbal cuing to "get inside the RW" - set-up environment for pt to navigate through gym equipment to determine her ability to problem solve AD management and she continues to make last minute adjustments, but doesn't require cuing to correct it; however, when walking straight she does continue to veer AD towards R Has difficulty managing AD when turning L through doorways/around corners often letting AD get too far away from her  Continues to have L foot excessively externally rotated and abducted HR 88bpm   Continues to have poor carryover of bringing AD back with her when turning to sit in chair at end of gait trials.   Gait training ~1107ft using Up-Walker transitioned back to RW for final ~109ft - attempted Up-walker to see if it would improve pt's upright posture and proximity to AD to prevent her from excessively extending her elbows pushing the AD away and/or if she would be able to keep AD closer to her when turning because of the 4 wheels; however, no significant improvement noted and this AD places pt at increased fall risk due to the 4 wheels and ability to quickly roll too far forward  Pt with fatigue towards end of gait trial    Dynamic standing balance intervention of alternating foot taps to green step (1x purple plate) with 2 colored post-it notes as external targets for each foot (R = pink and L=blue)  - requires skilled heavy min/light mod A for  balance Pt with increased difficulty shifting weight onto L LE and picking up R LE to tap target Pt only able to perform ~3 foot taps prior to B LE fatigue with muscle shaking needing seated rest break    Stand pivot transfer green chair>transport chair with skilled heavy min A at this time due to fatigue.  Continues to require 1-step cuing with increased time given for processing prior to initiating movements/motor plans. Pt continues to be easily distracted in busy environments.     PATIENT EDUCATION: Education details: assessment findings, goals, plan, prognosis, transfer technique, safe use of AD Person educated: Patient and Parent Education method: Explanation, Demonstration, Tactile cues, and Verbal cues Education comprehension: returned demonstration, verbal cues required, tactile cues required, and needs further education  HOME EXERCISE PROGRAM: Access Code: [email or web address] URL: [email or web address] Date: 06/11/2023 Prepared by: Aurora Lees  Exercises - Standing March with Counter Support  - 1 x daily - 7 x weekly - 2 sets - 8 reps - Side Stepping with Counter Support  - 1 x daily - 7 x weekly - 2 sets - 4 reps - Sit to Stand with Armchair  - 1 x daily - 7 x weekly - 2 sets - 5 reps - Seated March  - 1 x daily - 7 x weekly - 3 sets - 10 reps  GOALS: Goals reviewed with patient? Yes   SHORT TERM GOALS: Target date: 07/11/2023   Patient will be independent in home exercise program to improve strength/mobility for better functional independence with ADLs. Baseline: Goal status: INITIAL   LONG TERM GOALS: Target date: 08/22/2023   1.  Patient will complete five times sit to stand test in < 35 seconds indicating an increased LE strength and improved balance. Baseline: previously 37 sec with UUE 04/19/22 today 05/30/23: 1 min 43 seconds Goal status: INITIAL  2. Patient will demo ability to ambulate > 400 ft w/ LRAD  versus hand held assist min assist to demonstrate improved household mobility and short community distances.  Baseline: 4/28: 168ft with RW. Mod assist for AD management to prevent veer to the R.  Goal status: INITIAL  3.  Patient will increase 10 meter walk test to >1.26m/s as to improve gait speed for better community ambulation and to reduce fall risk. Baseline: previously 0.36 m/s on 04/19/22 : 0.25 m/s Goal status: INITIAL  4.  Patient will reduce timed up and go to <50 seconds to reduce fall risk and demonstrate improved transfer/gait ability Baseline: previously 91 sec with RW on 04/19/22; 05/30/23: 1 min 30 sec with RW  Goal status: INITIAL  5. Pt will exhibit correct and safe technique with stand>sit into a standard chair or transport chair to reduce risk of falling.   Baseline: pt current technique unsafe, poor motor planning  Goal status: INITIAL  6. The pt will demonstrate at least a 15 point improvement on SIS-16 to indicate increased ease with ADLs.  Baseline: 45 (filled out by pt's mother)  Goal status: INITIAL    ASSESSMENT:  CLINICAL IMPRESSION:  Pt is a pleasant 38 y/o female seen today for PT treatment for impairments due to anoxic brain injury in 2021. PT treatment focused on attention to task and motor planning AD management during gait with turns. Patient makes last second adjustments of AD to navigate around obstacles and continues to have tendency for AD to veer R. Pt continues to require repeated verbal cuing to "get inside" AD, especially when turning through doorways. Attempted use of Up-Walker  today; however, pt still has tendency to want to excessively extend elbows and push AD forward so do not feel this AD is safe at this time. However, pt is able to consistently ambulate with primarily min A today when given increased verbal cuing to attend to task and correct AD management.  Ms. Rivest will benefit from further skilled PT to improve the above impairments in order  to decrease fall risk and increase safety with mobility and ADLs.   OBJECTIVE IMPAIRMENTS: Abnormal gait, decreased activity tolerance, decreased balance, decreased coordination, decreased endurance, decreased knowledge of use of DME, decreased mobility, difficulty walking, decreased strength, decreased safety awareness, increased edema, impaired tone, improper body mechanics, and postural dysfunction.   ACTIVITY LIMITATIONS: carrying, lifting, bending, standing, squatting, stairs, transfers, bathing, dressing, locomotion level, and caring for others  PARTICIPATION LIMITATIONS: meal prep, cleaning, laundry, medication management, personal finances, driving, shopping, community activity, occupation, and yard work  PERSONAL FACTORS: Sex, Time since onset of injury/illness/exacerbation, and 1-2 comorbidities: per chart PMH includes CAD, HTN, ischemic cardiomyopathy, STEMI, ventricular fibrillation, dysphagia, s/p ICD, apraxia  are also affecting patient's functional outcome.   REHAB POTENTIAL: Fair    CLINICAL DECISION MAKING: Evolving/moderate complexity  EVALUATION COMPLEXITY: High  PLAN:  PT FREQUENCY: 1-2x/week  PT DURATION: 12 weeks  PLANNED INTERVENTIONS: 97164- PT Re-evaluation, 97750- Physical Performance Testing, 97110-Therapeutic exercises, 97530- Therapeutic activity, 97112- Neuromuscular re-education, 97535- Self Care, 16109- Manual therapy, [phone]- Gait training, [phone]- Orthotic Initial, [phone]- Orthotic/Prosthetic subsequent, [phone]- Canalith repositioning, Patient/Family education, Balance training, Stair training, Taping, Joint mobilization, Spinal mobilization, Vestibular training, DME instructions, Wheelchair mobility training, Cryotherapy, and Moist heat  PLAN FOR NEXT SESSION:  - focus on increased intensity of exercise to see if this can promote neural healing for improved learning   - high intensity gait training with/without AD - focus on question cuing to improve pt's  ability to identify errors in her motor plans - practice AD management when turning     Carlen Chasten, PT, DPT, NCS, CSRS Physical Therapist - [name]  11:03 AM 06/21/23

## 2023-06-25 ENCOUNTER — Ambulatory Visit

## 2023-06-25 DIAGNOSIS — R278 Other lack of coordination: Secondary | ICD-10-CM

## 2023-06-25 DIAGNOSIS — M6281 Muscle weakness (generalized): Secondary | ICD-10-CM

## 2023-06-25 DIAGNOSIS — G931 Anoxic brain damage, not elsewhere classified: Secondary | ICD-10-CM

## 2023-06-25 DIAGNOSIS — R2681 Unsteadiness on feet: Secondary | ICD-10-CM

## 2023-06-25 DIAGNOSIS — R262 Difficulty in walking, not elsewhere classified: Secondary | ICD-10-CM

## 2023-06-25 NOTE — Therapy (Signed)
 OUTPATIENT PHYSICAL THERAPY NEURO TREATMENT   Patient Name: Beverly [name] MRN: [id number] DOB:09/09/1985, 38 y.o., female Today's Date: 06/25/2023   PCP: [facility] REFERRING PROVIDER: Rawland Caddy, MD  END OF SESSION:   PT End of Session - 06/25/23 1021     Visit Number 8    Number of Visits 25    Date for PT Re-Evaluation 08/22/23    PT Start Time 1021    PT Stop Time 1100    PT Time Calculation (min) 39 min    Equipment Utilized During Treatment Gait belt    Activity Tolerance Other (comment);Patient tolerated treatment well   difficulty attending to tasks, easily distractable   Behavior During Therapy Impulsive             Past Medical History:  Diagnosis Date   Brain injury [facility])    CAD (coronary artery disease)    HLD (hyperlipidemia)    Hypertension    last pregnancy   Ischemic cardiomyopathy    STEMI (ST elevation myocardial infarction) (HCC) 09/2019   SCAD with aneurysmal dilation of proximal LAD.   Vaginal Pap smear, abnormal    when she was 38yo   Ventricular fibrillation (HCC) 09/23/2019   Past Surgical History:  Procedure Laterality Date   CARDIAC CATHETERIZATION     IR GASTROSTOMY TUBE MOD SED  10/07/2019   IR GASTROSTOMY TUBE REMOVAL  07/02/2020   IR REPLC GASTRO/COLONIC TUBE PERCUT W/FLUORO  11/03/2021   IR REPLC GASTRO/COLONIC TUBE PERCUT W/FLUORO  03/09/2023   LEFT HEART CATH AND CORONARY ANGIOGRAPHY N/A 09/23/2019   Procedure: LEFT HEART CATH AND CORONARY ANGIOGRAPHY;  Surgeon: Sammy Crisp, MD;  Location: [facility] INVASIVE CV LAB;  Service: Cardiovascular;  Laterality: N/A;   SUBQ ICD IMPLANT N/A 11/26/2020   Procedure: SUBQ ICD IMPLANT;  Surgeon: Boyce Byes, MD;  Location: [facility] INVASIVE CV LAB;  Service: Cardiovascular;  Laterality: N/A;   Patient Active Problem List   Diagnosis Date Noted   Apraxia 10/05/2021   S/P ICD (internal cardiac defibrillator) procedure 11/27/2020   Hx of Cardiac arrest (HCC)  11/26/2020   Abnormality of gait 03/16/2020   Sleep disturbance 03/16/2020   Oropharyngeal dysphagia 02/12/2020   Coronary artery disease involving native coronary artery of native heart without angina pectoris 12/12/2019   Ischemic cardiomyopathy 12/12/2019   Brain injury (HCC)    Prediabetes    Anoxic brain injury (HCC) 11/05/2019   Dysphagia 11/05/2019   Physical deconditioning 11/05/2019   Acute delirium 11/05/2019   Respiratory failure (HCC)    Encounter for central line placement    Ventricular fibrillation (HCC) 09/23/2019   Acute combined systolic and diastolic heart failure (HCC) 09/23/2019   Ventricular tachycardia, polymorphic (HCC) 09/23/2019   Pelvic pain affecting pregnancy 10/15/2015   Supervision of normal pregnancy in third trimester 09/28/2015   Poor weight gain of pregnancy 09/28/2015   Iron  deficiency anemia of pregnancy 09/01/2015   Increased BMI (body mass index) 07/29/2015    ONSET DATE: August 2021  REFERRING DIAG:  G93.1 (ICD-10-CM) - Anoxic brain injury (HCC)  R48.2 (ICD-10-CM) - Apraxia    THERAPY DIAG:   Anoxic brain injury (HCC)  Other lack of coordination  Difficulty in walking, not elsewhere classified  Unsteadiness on feet  Muscle weakness (generalized)  Rationale for Evaluation and Treatment: Rehabilitation  SUBJECTIVE:  SUBJECTIVE STATEMENT:   Pt arrived with mother, Loreda Rodriguez.  Pt denies pain to start session. Denies stumbles/falls.   From Eval: Pt known to PT clinic. Previously seen for outpatient services March 2024. Pt was receiving HH PT until October 2024 per her mother's report. [name] reports pt using HHA for ambulation, was using RW but she says was told not to do to difficulty with safe technique. Pt has fallen 1x in the past six months  trying to ambulate on her own. Pt requires assistance with ADLs, including bathing and dressing. [name] reports pt never indicates any pain. Pt with severe aphasia so is unable to answer questions/pt's mom provides hx. She reports pt has impaired short-term memory, difficulty with stair climbing (has stair lift at home). She reports pt's BLE are weaker than they used to be. The pt is probably limited to <10 min with ambulation due to poor endurance.  Pt reports her goal is to be able to use an AD to achieve mod-I mobility rather than relying on someone's HHA to ambulate.  Pt accompanied by: self, father   PERTINENT HISTORY:   Pt known to PT clinic with the following hx per previous PT course:   " Patient is a 38 year old with diagnois of anoxic brain injury. She was receiving outpatient PT until Feb. 2022 last year but ran out of visits. Patient has past medical history of Anoxic Brain Injury 09/2019, CAD, HLD, HTN, ISCHEMIC CARDIMYOPATHY, STEMI, V-FIB, ICD on 11/29/2020  .On 09/23/19 patient presented to ED 2 weeks postpartum (had emergent C-Section) with chest pain. In ED sustained VFib Cardiac Arrest requiring intubation. EKG showed ST elevation, Cath Lab showed aneurysmal dissection of the LAD was identified. Course complicated by encephalopathy, anoxic brain injury, enterobacterial PNA. Patient is currently living with mom. Mother reports she is abel to walk some with a walker with supervision but primarily ambulates with HHA of mother who is her guardian. Mother also reports she requires assist for all transfers and ADL's. "  PAIN:  Are you having pain? No  PRECAUTIONS: Fall  WEIGHT BEARING RESTRICTIONS: No  FALLS: Has patient fallen in last 6 months? Yes. Number of falls 1  LIVING ENVIRONMENT: Lives with: lives with their family, mother Lives in: House/apartment Stairs: has stairs but stair lift installed, pt using lift Has following equipment at home: Environmental consultant - 2 wheeled,  Wheelchair (manual), shower chair, and Grab bars, pt uses stair lift  PLOF: Needs assistance with ADLs  PATIENT GOALS: balance, walking, and pt states basketball  OBJECTIVE:  Note: Objective measures were completed at Evaluation unless otherwise noted.  DIAGNOSTIC FINDINGS:  None recent in chart  COGNITION: Overall cognitive status: impaired   SENSATION: Unable to complete testing due to impaired cognition, pt unable to report if sensation impairment present, pt mother unsure   COORDINATION: Pt unable to follow cues for testing  EDEMA:  Mom reports pt's feet do swell from time to time   Convergence: Impaired L eye  MUSCLE TONE: impaired bilat LE   POSTURE: difficulty assessing due to pt ataxic movement, typically observed to sacral sit in session   LOWER EXTREMITY MMT:  *accuracy of testing likely impacted by pt difficulty following cues  MMT Right Eval Left Eval  Hip flexion 4+ 4+  Hip extension    Hip abduction 4+ 4+  Hip adduction 4+ 4+  Hip internal rotation    Hip external rotation    Knee flexion 4+ 4+  Knee extension 5 5  Ankle dorsiflexion    Ankle plantarflexion    Ankle inversion    Ankle eversion    (Blank rows = not tested)   TRANSFERS: HHA  tried in session, very unsteady, pt has difficulty with motor planning, little success with cues due to distraction, very unsafe Attempted in session with RW, pt unsteady, gait ataxic, continued significant difficulty with motor planning requiring up to min a + 2 and multi-modal cues to safely sit in chair.    STAIRS: Impaired per pt's mother GAIT: ataxic   FUNCTIONAL TESTS:  5 times sit to stand: 1 min 43 sec Timed up and go (TUG): 1 min 30 sec with RW 10 meter walk test: 0.25 m/s with RW extensive cuing to complete activity  PATIENT SURVEYS:  SIS-16 filled out by pt's mother, used as questions relevant to pt: 45                                                                                                                               TREATMENT DATE: 06/25/2023   Pt arrives in transport chair.   Stand pivot transport chair>green chair to the L with skilled min A for balance and therapist providing only min verbal/visual/tactile cuing for sequencing - provided R HHA.  STS 2x5 with cuing and tactile feedback for proper form  Gait training ~157ft using RW with skilled min A for balance and AD management as well as mod/max verbal cuing to "get inside the RW" - set-up environment for pt to navigate through gym equipment to determine her ability to problem solve AD management and she continues to make last minute adjustments, but doesn't require cuing to correct it; however, when walking straight she does continue to veer AD towards R Has difficulty managing AD when turning L through doorways/around corners often letting AD get too far away from her  Continues to have L foot excessively externally rotated and abducted   Continues to have poor carryover of bringing AD back with her when turning to sit in chair at end of gait trials.  Pt had one instance of instability that required therapist intervention to prevent uncontrolled descent to the floor.   Dynamic standing balance intervention of alternating UE taps with lateral steps to touch the post-it notes on the mirror. Pt with increased difficulty shifting weight onto L LE and easily distracted crossing up the UE's   Stand pivot transfer green chair>transport chair with skilled heavy min A at this time due to fatigue.  Continues to require 1-step cuing with increased time given for processing prior to initiating movements/motor plans. Pt continues to be easily distracted in busy environments.     PATIENT EDUCATION: Education details: assessment findings, goals, plan, prognosis, transfer technique, safe use of AD Person educated: Patient and Parent Education method: Explanation, Demonstration, Tactile cues, and Verbal cues Education  comprehension: returned demonstration, verbal cues required, tactile cues required, and needs further education  HOME EXERCISE PROGRAM: Access  Code: [email or web address] URL: [email or web address] Date: 06/11/2023 Prepared by: Aurora Lees  Exercises - Standing March with Counter Support  - 1 x daily - 7 x weekly - 2 sets - 8 reps - Side Stepping with Counter Support  - 1 x daily - 7 x weekly - 2 sets - 4 reps - Sit to Stand with Armchair  - 1 x daily - 7 x weekly - 2 sets - 5 reps - Seated March  - 1 x daily - 7 x weekly - 3 sets - 10 reps  GOALS: Goals reviewed with patient? Yes   SHORT TERM GOALS: Target date: 07/11/2023   Patient will be independent in home exercise program to improve strength/mobility for better functional independence with ADLs. Baseline: Goal status: INITIAL   LONG TERM GOALS: Target date: 08/22/2023   1.  Patient will complete five times sit to stand test in < 35 seconds indicating an increased LE strength and improved balance. Baseline: previously 37 sec with UUE 04/19/22 today 05/30/23: 1 min 43 seconds Goal status: INITIAL  2. Patient will demo ability to ambulate > 400 ft w/ LRAD versus hand held assist min assist to demonstrate improved household mobility and short community distances.  Baseline: 4/28: 130ft with RW. Mod assist for AD management to prevent veer to the R.  Goal status: INITIAL  3.  Patient will increase 10 meter walk test to >1.43m/s as to improve gait speed for better community ambulation and to reduce fall risk. Baseline: previously 0.36 m/s on 04/19/22 : 0.25 m/s Goal status: INITIAL  4.  Patient will reduce timed up and go to <50 seconds to reduce fall risk and demonstrate improved transfer/gait ability Baseline: previously 91 sec with RW on 04/19/22; 05/30/23: 1 min 30 sec with RW  Goal status: INITIAL  5. Pt will exhibit correct and safe technique with stand>sit into a standard chair or transport chair to reduce risk of  falling.   Baseline: pt current technique unsafe, poor motor planning  Goal status: INITIAL  6. The pt will demonstrate at least a 15 point improvement on SIS-16 to indicate increased ease with ADLs.  Baseline: 45 (filled out by pt's mother)  Goal status: INITIAL    ASSESSMENT:  CLINICAL IMPRESSION:   Pt continues to put forth good effort throughout the session, however is easily distracted throughout the session.  Pt performed better with the second lap around the gym with reduced verbal cues, however took longer to process turns and ambulate.  Pt ultimately has lack of good carryover for gait mechanics and will continue to benefit from continued gait training and neuro based exercises to improve overall stability moving forward.   Pt will continue to benefit from skilled therapy to address remaining deficits in order to improve overall QoL and return to PLOF.     OBJECTIVE IMPAIRMENTS: Abnormal gait, decreased activity tolerance, decreased balance, decreased coordination, decreased endurance, decreased knowledge of use of DME, decreased mobility, difficulty walking, decreased strength, decreased safety awareness, increased edema, impaired tone, improper body mechanics, and postural dysfunction.   ACTIVITY LIMITATIONS: carrying, lifting, bending, standing, squatting, stairs, transfers, bathing, dressing, locomotion level, and caring for others  PARTICIPATION LIMITATIONS: meal prep, cleaning, laundry, medication management, personal finances, driving, shopping, community activity, occupation, and yard work  PERSONAL FACTORS: Sex, Time since onset of injury/illness/exacerbation, and 1-2 comorbidities: per chart PMH includes CAD, HTN, ischemic cardiomyopathy, STEMI, ventricular fibrillation, dysphagia, s/p ICD, apraxia  are also affecting patient's functional outcome.   REHAB POTENTIAL: Fair  CLINICAL DECISION MAKING: Evolving/moderate complexity  EVALUATION COMPLEXITY:  High  PLAN:  PT FREQUENCY: 1-2x/week  PT DURATION: 12 weeks  PLANNED INTERVENTIONS: 97164- PT Re-evaluation, 97750- Physical Performance Testing, 97110-Therapeutic exercises, 97530- Therapeutic activity, 97112- Neuromuscular re-education, 97535- Self Care, 29528- Manual therapy, [phone]- Gait training, [phone]- Orthotic Initial, [phone]- Orthotic/Prosthetic subsequent, [phone]- Canalith repositioning, Patient/Family education, Balance training, Stair training, Taping, Joint mobilization, Spinal mobilization, Vestibular training, DME instructions, Wheelchair mobility training, Cryotherapy, and Moist heat  PLAN FOR NEXT SESSION:  - focus on increased intensity of exercise to see if this can promote neural healing for improved learning   - high intensity gait training with/without AD - focus on question cuing to improve pt's ability to identify errors in her motor plans - practice AD management when turning     Carlen Chasten, PT, DPT, NCS, CSRS Physical Therapist - [facility]  [facility]  1:54 PM 06/25/23

## 2023-06-28 ENCOUNTER — Ambulatory Visit: Admitting: Physical Therapy

## 2023-07-04 ENCOUNTER — Ambulatory Visit

## 2023-07-04 DIAGNOSIS — R269 Unspecified abnormalities of gait and mobility: Secondary | ICD-10-CM

## 2023-07-04 DIAGNOSIS — R278 Other lack of coordination: Secondary | ICD-10-CM

## 2023-07-04 DIAGNOSIS — G931 Anoxic brain damage, not elsewhere classified: Secondary | ICD-10-CM

## 2023-07-04 DIAGNOSIS — M6281 Muscle weakness (generalized): Secondary | ICD-10-CM

## 2023-07-04 DIAGNOSIS — R262 Difficulty in walking, not elsewhere classified: Secondary | ICD-10-CM

## 2023-07-04 DIAGNOSIS — R2681 Unsteadiness on feet: Secondary | ICD-10-CM

## 2023-07-04 DIAGNOSIS — R2689 Other abnormalities of gait and mobility: Secondary | ICD-10-CM

## 2023-07-04 NOTE — Therapy (Signed)
 OUTPATIENT PHYSICAL THERAPY TREATMENT   Patient Name: Beverly [name] MRN: [id number] DOB:1985/12/30, 38 y.o., female Today's Date: 07/04/2023  PCP: [facility] REFERRING PROVIDER: Rawland Caddy, MD  END OF SESSION:   PT End of Session - 07/04/23 1424     Visit Number 9    Number of Visits 25    Date for PT Re-Evaluation 08/22/23    Authorization Type UHC Dual Complete; Medicaid secondary    Authorization Time Period 05/30/23-08/22/23    Progress Note Due on Visit 10    PT Start Time 1015    PT Stop Time 1055    PT Time Calculation (min) 40 min    Equipment Utilized During Treatment Gait belt    Activity Tolerance Patient tolerated treatment well    Behavior During Therapy Flat affect;Impulsive;WFL for tasks assessed/performed             Past Medical History:  Diagnosis Date   Brain injury [facility])    CAD (coronary artery disease)    HLD (hyperlipidemia)    Hypertension    last pregnancy   Ischemic cardiomyopathy    STEMI (ST elevation myocardial infarction) (HCC) 09/2019   SCAD with aneurysmal dilation of proximal LAD.   Vaginal Pap smear, abnormal    when she was 38yo   Ventricular fibrillation (HCC) 09/23/2019   Past Surgical History:  Procedure Laterality Date   CARDIAC CATHETERIZATION     IR GASTROSTOMY TUBE MOD SED  10/07/2019   IR GASTROSTOMY TUBE REMOVAL  07/02/2020   IR REPLC GASTRO/COLONIC TUBE PERCUT W/FLUORO  11/03/2021   IR REPLC GASTRO/COLONIC TUBE PERCUT W/FLUORO  03/09/2023   LEFT HEART CATH AND CORONARY ANGIOGRAPHY N/A 09/23/2019   Procedure: LEFT HEART CATH AND CORONARY ANGIOGRAPHY;  Surgeon: Sammy Crisp, MD;  Location: [facility] INVASIVE CV LAB;  Service: Cardiovascular;  Laterality: N/A;   SUBQ ICD IMPLANT N/A 11/26/2020   Procedure: SUBQ ICD IMPLANT;  Surgeon: Boyce Byes, MD;  Location: [facility] INVASIVE CV LAB;  Service: Cardiovascular;  Laterality: N/A;   Patient Active Problem List   Diagnosis Date Noted    Apraxia 10/05/2021   S/P ICD (internal cardiac defibrillator) procedure 11/27/2020   Hx of Cardiac arrest (HCC) 11/26/2020   Abnormality of gait 03/16/2020   Sleep disturbance 03/16/2020   Oropharyngeal dysphagia 02/12/2020   Coronary artery disease involving native coronary artery of native heart without angina pectoris 12/12/2019   Ischemic cardiomyopathy 12/12/2019   Brain injury (HCC)    Prediabetes    Anoxic brain injury (HCC) 11/05/2019   Dysphagia 11/05/2019   Physical deconditioning 11/05/2019   Acute delirium 11/05/2019   Respiratory failure (HCC)    Encounter for central line placement    Ventricular fibrillation (HCC) 09/23/2019   Acute combined systolic and diastolic heart failure (HCC) 09/23/2019   Ventricular tachycardia, polymorphic (HCC) 09/23/2019   Pelvic pain affecting pregnancy 10/15/2015   Supervision of normal pregnancy in third trimester 09/28/2015   Poor weight gain of pregnancy 09/28/2015   Iron  deficiency anemia of pregnancy 09/01/2015   Increased BMI (body mass index) 07/29/2015    ONSET DATE: August 2021  REFERRING DIAG:  G93.1 (ICD-10-CM) - Anoxic brain injury (HCC)  R48.2 (ICD-10-CM) - Apraxia    THERAPY DIAG:   Anoxic brain injury (HCC)  Other lack of coordination  Difficulty in walking, not elsewhere classified  Unsteadiness on feet  Muscle weakness (generalized)  Abnormality of gait and mobility  Other abnormalities of gait and mobility  Rationale for  Evaluation and Treatment: Rehabilitation  SUBJECTIVE:                                                                                                                                                                                             SUBJECTIVE STATEMENT:  No updates per pt's father, although he makes known that he does not live with patient and is not aware of any new information.   Pt accompanied by: self, father   PERTINENT HISTORY:  "Patient is a 38 year old with  diagnois of anoxic brain injury. Pt known to PT clinic. Previously seen for outpatient services March 2024. Pt was receiving HH PT until October 2024 per her mother's report. [name] reports pt using HHA for ambulation, was using RW but she says was told not to do to difficulty with safe technique. Pt has fallen 1x in the past six months trying to ambulate on her own. Pt requires assistance with ADLs, including bathing and dressing. [name] reports pt never indicates any pain. Pt with severe aphasia so is unable to answer questions/pt's mom provides hx. She reports pt has impaired short-term memory, difficulty with stair climbing (has stair lift at home). She reports pt's BLE are weaker than they used to be. The pt is probably limited to <10 min with ambulation due to poor endurance. Pt reports her goal is to be able to use an AD to achieve mod-I mobility rather than relying on someone's HHA to ambulate.She was receiving outpatient PT until Feb. 2022 last year but ran out of visits. Patient has past medical history of Anoxic Brain Injury 09/2019, CAD, HLD, HTN, ISCHEMIC CARDIMYOPATHY, STEMI, V-FIB, ICD on 11/29/2020. Patient is currently living with mom. Mother reports she is abel to walk some with a walker with supervision but primarily ambulates with HHA of mother who is her guardian. Mother also reports she requires assist for all transfers and ADL's. "  PAIN:  Are you having pain? No  PRECAUTIONS: Fall  WEIGHT BEARING RESTRICTIONS: No  FALLS: Has patient fallen in last 6 months? Yes. Number of falls 1  LIVING ENVIRONMENT: Lives with: lives with their family, mother Lives in: House/apartment Stairs: has stairs but stair lift installed, pt using lift Has following equipment at home: Environmental consultant - 2 wheeled, Wheelchair (manual), shower chair, and Grab bars, pt uses stair lift  PLOF: Needs assistance with ADLs  PATIENT GOALS: balance, walking, and pt states basketball  OBJECTIVE:  Note:  Objective measures were completed at Evaluation unless otherwise noted.  DIAGNOSTIC FINDINGS:  None recent in chart  COGNITION: Overall cognitive status: impaired   SENSATION: Unable  to complete testing due to impaired cognition, pt unable to report if sensation impairment present, pt mother unsure   EDEMA:  Mom reports pt's feet do swell from time to time   LOWER EXTREMITY MMT:  *accuracy of testing likely impacted by pt difficulty following cues  MMT Right Eval Left Eval  Hip flexion 4+ 4+  Hip extension    Hip abduction 4+ 4+  Hip adduction 4+ 4+  Hip internal rotation    Hip external rotation    Knee flexion 4+ 4+  Knee extension 5 5  Ankle dorsiflexion    Ankle plantarflexion    Ankle inversion    Ankle eversion    (Blank rows = not tested)  TRANSFERS: HHA  tried in session, very unsteady, pt has difficulty with motor planning, little success with cues due to distraction, very unsafe Attempted in session with RW, pt unsteady, gait ataxic, continued significant difficulty with motor planning requiring up to min a + 2 and multi-modal cues to safely sit in chair.   FUNCTIONAL TESTS:  5 times sit to stand: 1 min 43 sec Timed up and go (TUG): 1 min 30 sec with RW 10 meter walk test: 0.25 m/s with RW extensive cuing to complete activity  PATIENT SURVEYS:  SIS-16 filled out by pt's mother, used as questions relevant to pt: 45                                                                                                                              TREATMENT DATE: 07/04/2023 Seated deadlift c varrying weight balls  Seated alterante table tables, each side weight balls 5 cone pick up from floor and reach to hand off  Standing art RW forward object rach, gasp forward hand off x5, then return object 5x to table Walking in clinic, RW, heavy cues for navigation, attention to task, RW management at times  AMB musical chairs with a 3 chair triangle for task simplification  and visual targeting c RW (sturggle swith alignment with chair prior to sitting): 8x STS transfers and 8x AMB segments, needs max to total assist for safe return to sitting each time despite cues.     PATIENT EDUCATION: Education details: assessment findings, goals, plan, prognosis, transfer technique, safe use of AD Person educated: Patient and Parent Education method: Explanation, Demonstration, Tactile cues, and Verbal cues Education comprehension: returned demonstration, verbal cues required, tactile cues required, and needs further education  HOME EXERCISE PROGRAM: Access Code: [email or web address] URL: [email or web address] Date: 06/11/2023 Prepared by: Aurora Lees  Exercises - Standing March with Counter Support  - 1 x daily - 7 x weekly - 2 sets - 8 reps - Side Stepping with Counter Support  - 1 x daily - 7 x weekly - 2 sets - 4 reps - Sit to Stand with Armchair  - 1 x daily - 7 x weekly - 2 sets - 5 reps - Seated March  - 1 x daily -  7 x weekly - 3 sets - 10 reps  GOALS: Goals reviewed with patient? Yes   SHORT TERM GOALS: Target date: 07/11/2023   Patient will be independent in home exercise program to improve strength/mobility for better functional independence with ADLs. Baseline: Goal status: INITIAL   LONG TERM GOALS: Target date: 08/22/2023   1.  Patient will complete five times sit to stand test in < 35 seconds indicating an increased LE strength and improved balance. Baseline: previously 37 sec with UUE 04/19/22 today 05/30/23: 1 min 43 seconds Goal status: INITIAL  2. Patient will demo ability to ambulate > 400 ft w/ LRAD versus hand held assist min assist to demonstrate improved household mobility and short community distances.  Baseline: 4/28: 187ft with RW. Mod assist for AD management to prevent veer to the R.  Goal status: INITIAL  3.  Patient will increase 10 meter walk test to >1.94m/s as to improve gait speed for better community ambulation and to  reduce fall risk. Baseline: previously 0.36 m/s on 04/19/22 : 0.25 m/s Goal status: INITIAL  4.  Patient will reduce timed up and go to <50 seconds to reduce fall risk and demonstrate improved transfer/gait ability Baseline: previously 91 sec with RW on 04/19/22; 05/30/23: 1 min 30 sec with RW  Goal status: INITIAL  5. Pt will exhibit correct and safe technique with stand>sit into a standard chair or transport chair to reduce risk of falling.   Baseline: pt current technique unsafe, poor motor planning  Goal status: INITIAL  6. The pt will demonstrate at least a 15 point improvement on SIS-16 to indicate increased ease with ADLs.  Baseline: 45 (filled out by pt's mother)  Goal status: INITIAL  ASSESSMENT:  CLINICAL IMPRESSION:   Session focus on functional activity simulation, a wide variety of activities, settings, positions, single to multi tasking. Authro takes time to simplify activities and instructions as pt does better with simple, task related cues. Pt does fairly well with multimodal cuing, but has poor attention span and requires frequent redirection. More complex motor tasks requires substantial facilitation for safety and completion. No frank signs of exertion of fatigue in session. Pt is showing improvement in problem solving and improvised motor planning when initial strategy is unsuccessful. AMB remains very unsafe due to cognitive executive difficulty Pt will continue to benefit from skilled therapy to address remaining deficits in order to improve overall QoL and return to PLOF.     OBJECTIVE IMPAIRMENTS: Abnormal gait, decreased activity tolerance, decreased balance, decreased coordination, decreased endurance, decreased knowledge of use of DME, decreased mobility, difficulty walking, decreased strength, decreased safety awareness, increased edema, impaired tone, improper body mechanics, and postural dysfunction.   ACTIVITY LIMITATIONS: carrying, lifting, bending, standing,  squatting, stairs, transfers, bathing, dressing, locomotion level, and caring for others  PARTICIPATION LIMITATIONS: meal prep, cleaning, laundry, medication management, personal finances, driving, shopping, community activity, occupation, and yard work  PERSONAL FACTORS: Sex, Time since onset of injury/illness/exacerbation, and 1-2 comorbidities: per chart PMH includes CAD, HTN, ischemic cardiomyopathy, STEMI, ventricular fibrillation, dysphagia, s/p ICD, apraxia  are also affecting patient's functional outcome.   REHAB POTENTIAL: Fair    CLINICAL DECISION MAKING: Evolving/moderate complexity  EVALUATION COMPLEXITY: High  PLAN:  PT FREQUENCY: 1-2x/week  PT DURATION: 12 weeks  PLANNED INTERVENTIONS: 97164- PT Re-evaluation, 97750- Physical Performance Testing, 97110-Therapeutic exercises, 97530- Therapeutic activity, [id number]- Neuromuscular re-education, 97535- Self Care, 69629- Manual therapy, [id number]- Gait training, [id number]- Orthotic Initial, [id number]- Orthotic/Prosthetic subsequent, [phone]- Canalith repositioning, Patient/Family education, Balance  training, Stair training, Taping, Joint mobilization, Spinal mobilization, Vestibular training, DME instructions, Wheelchair mobility training, Cryotherapy, and Moist heat  PLAN FOR NEXT SESSION:    2:27 PM, 07/04/23 Dawn Eth, PT, DPT Physical Therapist - [facility]  Outpatient Physical Therapy- Main Campus [phone]

## 2023-07-10 ENCOUNTER — Ambulatory Visit: Attending: Physical Medicine & Rehabilitation

## 2023-07-10 DIAGNOSIS — G931 Anoxic brain damage, not elsewhere classified: Secondary | ICD-10-CM | POA: Diagnosis present

## 2023-07-10 DIAGNOSIS — R2689 Other abnormalities of gait and mobility: Secondary | ICD-10-CM | POA: Insufficient documentation

## 2023-07-10 DIAGNOSIS — R269 Unspecified abnormalities of gait and mobility: Secondary | ICD-10-CM | POA: Diagnosis present

## 2023-07-10 DIAGNOSIS — R278 Other lack of coordination: Secondary | ICD-10-CM | POA: Diagnosis present

## 2023-07-10 DIAGNOSIS — R2681 Unsteadiness on feet: Secondary | ICD-10-CM | POA: Diagnosis present

## 2023-07-10 DIAGNOSIS — R262 Difficulty in walking, not elsewhere classified: Secondary | ICD-10-CM | POA: Insufficient documentation

## 2023-07-10 DIAGNOSIS — M6281 Muscle weakness (generalized): Secondary | ICD-10-CM | POA: Diagnosis present

## 2023-07-10 NOTE — Therapy (Signed)
 OUTPATIENT PHYSICAL THERAPY TREATMENT/Physical Therapy Progress Note   Dates of reporting period  05/30/2023   to   07/10/2023    Patient Name: Beverly [name] MRN: [id number] DOB:1985-08-07, 38 y.o., female Today's Date: 07/10/2023  PCP: [facility] REFERRING PROVIDER: Rawland Caddy, MD  END OF SESSION:   PT End of Session - 07/10/23 1020     Visit Number 10    Number of Visits 25    Date for PT Re-Evaluation 08/22/23    Authorization Type UHC Dual Complete; Medicaid secondary    Authorization Time Period 05/30/23-08/22/23    Progress Note Due on Visit 10    PT Start Time 1021    PT Stop Time 1100    PT Time Calculation (min) 39 min    Equipment Utilized During Treatment Gait belt    Activity Tolerance Patient tolerated treatment well    Behavior During Therapy Flat affect;Impulsive;WFL for tasks assessed/performed             Past Medical History:  Diagnosis Date   Brain injury [facility])    CAD (coronary artery disease)    HLD (hyperlipidemia)    Hypertension    last pregnancy   Ischemic cardiomyopathy    STEMI (ST elevation myocardial infarction) (HCC) 09/2019   SCAD with aneurysmal dilation of proximal LAD.   Vaginal Pap smear, abnormal    when she was 38yo   Ventricular fibrillation (HCC) 09/23/2019   Past Surgical History:  Procedure Laterality Date   CARDIAC CATHETERIZATION     IR GASTROSTOMY TUBE MOD SED  10/07/2019   IR GASTROSTOMY TUBE REMOVAL  07/02/2020   IR REPLC GASTRO/COLONIC TUBE PERCUT W/FLUORO  11/03/2021   IR REPLC GASTRO/COLONIC TUBE PERCUT W/FLUORO  03/09/2023   LEFT HEART CATH AND CORONARY ANGIOGRAPHY N/A 09/23/2019   Procedure: LEFT HEART CATH AND CORONARY ANGIOGRAPHY;  Surgeon: Sammy Crisp, MD;  Location: [facility] INVASIVE CV LAB;  Service: Cardiovascular;  Laterality: N/A;   SUBQ ICD IMPLANT N/A 11/26/2020   Procedure: SUBQ ICD IMPLANT;  Surgeon: Boyce Byes, MD;  Location: [facility] INVASIVE CV LAB;  Service:  Cardiovascular;  Laterality: N/A;   Patient Active Problem List   Diagnosis Date Noted   Apraxia 10/05/2021   S/P ICD (internal cardiac defibrillator) procedure 11/27/2020   Hx of Cardiac arrest (HCC) 11/26/2020   Abnormality of gait 03/16/2020   Sleep disturbance 03/16/2020   Oropharyngeal dysphagia 02/12/2020   Coronary artery disease involving native coronary artery of native heart without angina pectoris 12/12/2019   Ischemic cardiomyopathy 12/12/2019   Brain injury (HCC)    Prediabetes    Anoxic brain injury (HCC) 11/05/2019   Dysphagia 11/05/2019   Physical deconditioning 11/05/2019   Acute delirium 11/05/2019   Respiratory failure (HCC)    Encounter for central line placement    Ventricular fibrillation (HCC) 09/23/2019   Acute combined systolic and diastolic heart failure (HCC) 09/23/2019   Ventricular tachycardia, polymorphic (HCC) 09/23/2019   Pelvic pain affecting pregnancy 10/15/2015   Supervision of normal pregnancy in third trimester 09/28/2015   Poor weight gain of pregnancy 09/28/2015   Iron  deficiency anemia of pregnancy 09/01/2015   Increased BMI (body mass index) 07/29/2015    ONSET DATE: August 2021  REFERRING DIAG:  G93.1 (ICD-10-CM) - Anoxic brain injury (HCC)  R48.2 (ICD-10-CM) - Apraxia    THERAPY DIAG:   Other lack of coordination  Unsteadiness on feet  Difficulty in walking, not elsewhere classified  Muscle weakness (generalized)  Rationale for  Evaluation and Treatment: Rehabilitation  SUBJECTIVE:                                                                                                                                                                                             SUBJECTIVE STATEMENT:  No updates per pt's father, although he makes known that he does not live with patient and is not aware of any new information.   Pt accompanied by: self, father   PERTINENT HISTORY:  "Patient is a 38 year old with diagnois of anoxic  brain injury. Pt known to PT clinic. Previously seen for outpatient services March 2024. Pt was receiving HH PT until October 2024 per her mother's report. [name] reports pt using HHA for ambulation, was using RW but she says was told not to do to difficulty with safe technique. Pt has fallen 1x in the past six months trying to ambulate on her own. Pt requires assistance with ADLs, including bathing and dressing. [name] reports pt never indicates any pain. Pt with severe aphasia so is unable to answer questions/pt's mom provides hx. She reports pt has impaired short-term memory, difficulty with stair climbing (has stair lift at home). She reports pt's BLE are weaker than they used to be. The pt is probably limited to <10 min with ambulation due to poor endurance. Pt reports her goal is to be able to use an AD to achieve mod-I mobility rather than relying on someone's HHA to ambulate.She was receiving outpatient PT until Feb. 2022 last year but ran out of visits. Patient has past medical history of Anoxic Brain Injury 09/2019, CAD, HLD, HTN, ISCHEMIC CARDIMYOPATHY, STEMI, V-FIB, ICD on 11/29/2020. Patient is currently living with mom. Mother reports she is abel to walk some with a walker with supervision but primarily ambulates with HHA of mother who is her guardian. Mother also reports she requires assist for all transfers and ADL's. "  PAIN:  Are you having pain? No  PRECAUTIONS: Fall  WEIGHT BEARING RESTRICTIONS: No  FALLS: Has patient fallen in last 6 months? Yes. Number of falls 1  LIVING ENVIRONMENT: Lives with: lives with their family, mother Lives in: House/apartment Stairs: has stairs but stair lift installed, pt using lift Has following equipment at home: Environmental consultant - 2 wheeled, Wheelchair (manual), shower chair, and Grab bars, pt uses stair lift  PLOF: Needs assistance with ADLs  PATIENT GOALS: balance, walking, and pt states basketball  OBJECTIVE:  Note: Objective measures were  completed at Evaluation unless otherwise noted.  DIAGNOSTIC FINDINGS:  None recent in chart  COGNITION: Overall cognitive status: impaired   SENSATION: Unable  to complete testing due to impaired cognition, pt unable to report if sensation impairment present, pt mother unsure   EDEMA:  Mom reports pt's feet do swell from time to time   LOWER EXTREMITY MMT:  *accuracy of testing likely impacted by pt difficulty following cues  MMT Right Eval Left Eval  Hip flexion 4+ 4+  Hip extension    Hip abduction 4+ 4+  Hip adduction 4+ 4+  Hip internal rotation    Hip external rotation    Knee flexion 4+ 4+  Knee extension 5 5  Ankle dorsiflexion    Ankle plantarflexion    Ankle inversion    Ankle eversion    (Blank rows = not tested)  TRANSFERS: HHA  tried in session, very unsteady, pt has difficulty with motor planning, little success with cues due to distraction, very unsafe Attempted in session with RW, pt unsteady, gait ataxic, continued significant difficulty with motor planning requiring up to min a + 2 and multi-modal cues to safely sit in chair.   FUNCTIONAL TESTS:  5 times sit to stand: 1 min 43 sec Timed up and go (TUG): 1 min 30 sec with RW 10 meter walk test: 0.25 m/s with RW extensive cuing to complete activity  PATIENT SURVEYS:  SIS-16 filled out by pt's mother, used as questions relevant to pt: 45                                                                                                                              TREATMENT DATE: 07/10/2023  Physical Performance: Five times Sit to Stand Test (FTSS)  TIME: 41 sec use of BUE  Cut off scores indicative of increased fall risk: >12 sec CVA, >16 sec PD, >13 sec vestibular (ANPTA Core Set of Outcome Measures for Adults with Neurologic Conditions, 2018) PT reviewed the above findings with pt and her mother who was present in session.   TA: SIS-16: 42 Please see goal section below for details of remaining  goals tested and completed during today's appointment.  Other interventions:  Seated march with external 2x10 each LE      PATIENT EDUCATION: Education details: goal reassessment, indications, exercise technique Person educated: Patient and Parent Education method: Explanation, Demonstration, Tactile cues, and Verbal cues Education comprehension: returned demonstration, verbal cues required, tactile cues required, and needs further education  HOME EXERCISE PROGRAM: Access Code: [email or web address] URL: [email or web address] Date: 06/11/2023 Prepared by: Aurora Lees  Exercises - Standing March with Counter Support  - 1 x daily - 7 x weekly - 2 sets - 8 reps - Side Stepping with Counter Support  - 1 x daily - 7 x weekly - 2 sets - 4 reps - Sit to Stand with Armchair  - 1 x daily - 7 x weekly - 2 sets - 5 reps - Seated March  - 1 x daily - 7 x weekly - 3 sets - 10 reps  GOALS: Goals reviewed with patient?  Yes   SHORT TERM GOALS: Target date: 07/11/2023   Patient will be independent in home exercise program to improve strength/mobility for better functional independence with ADLs. Baseline: Goal status: INITIAL   LONG TERM GOALS: Target date: 08/22/2023   1.  Patient will complete five times sit to stand test in < 35 seconds indicating an increased LE strength and improved balance. Baseline: previously 37 sec with UUE 04/19/22 today 05/30/23: 1 min 43 seconds; 6/3: 41 sec with improved technique (use of BUE on armrests)  Goal status: PARTIALLY MET  2. Patient will demo ability to ambulate > 400 ft w/ LRAD versus hand held assist min assist to demonstrate improved household mobility and short community distances.  Baseline: 4/28: 113ft with RW. Mod assist for AD management to prevent veer to the R. 6/3: ~ 300 ft with RW, requires cuing throughout for proximity to RW particularly with turns, but this has improved compared to eval Goal status: IN PROGRESS  3.  Patient will  increase 10 meter walk test to >1.34m/s as to improve gait speed for better community ambulation and to reduce fall risk. Baseline: previously 0.36 m/s on 04/19/22 : 0.25 m/s;  07/10/23: 0. 24 m/s with RW  Goal status: INITIAL  4.  Patient will reduce timed up and go to <50 seconds to reduce fall risk and demonstrate improved transfer/gait ability Baseline: previously 91 sec with RW on 04/19/22; 05/30/23: 1 min 30 sec with RW; 6/3: deferred to future visit  Goal status: INITIAL  5. Pt will exhibit correct and safe technique with stand>sit into a standard chair or transport chair to reduce risk of falling.   Baseline: pt current technique unsafe, poor motor planning; 6/3: pt with improved technique, bring RW closer to chair prior to sitting  Goal status: PARTIALLY MET   6. The pt will demonstrate at least a 15 point improvement on SIS-16 to indicate increased ease with ADLs.  Baseline: 45 (filled out by pt's mother); 42  Goal status: ONGOING  ASSESSMENT:  CLINICAL IMPRESSION:  Goal reassessment initiated for progress note. Pt shows improvement AEB increasing 5xSTS score, increasing amb for distance, and showing improvement with STS technique/safety. While pt making gains, pt with slight decrease in performance and slightly worse SIS-16 score (filled out by mother). TUG deferred to next visit due to fatigue following ambulation for distance/endurance. Patient's condition has the potential to improve in response to therapy. Maximum improvement is yet to be obtained. The anticipated improvement is attainable and reasonable in a generally predictable time. Pt will continue to benefit from skilled therapy to address remaining deficits in order to improve overall QoL and return to PLOF.     OBJECTIVE IMPAIRMENTS: Abnormal gait, decreased activity tolerance, decreased balance, decreased coordination, decreased endurance, decreased knowledge of use of DME, decreased mobility, difficulty walking,  decreased strength, decreased safety awareness, increased edema, impaired tone, improper body mechanics, and postural dysfunction.   ACTIVITY LIMITATIONS: carrying, lifting, bending, standing, squatting, stairs, transfers, bathing, dressing, locomotion level, and caring for others  PARTICIPATION LIMITATIONS: meal prep, cleaning, laundry, medication management, personal finances, driving, shopping, community activity, occupation, and yard work  PERSONAL FACTORS: Sex, Time since onset of injury/illness/exacerbation, and 1-2 comorbidities: per chart PMH includes CAD, HTN, ischemic cardiomyopathy, STEMI, ventricular fibrillation, dysphagia, s/p ICD, apraxia  are also affecting patient's functional outcome.   REHAB POTENTIAL: Fair    CLINICAL DECISION MAKING: Evolving/moderate complexity  EVALUATION COMPLEXITY: High  PLAN:  PT FREQUENCY: 1-2x/week  PT DURATION: 12 weeks  PLANNED INTERVENTIONS: 97164- PT Re-evaluation, 97750- Physical Performance Testing, 97110-Therapeutic exercises, 97530- Therapeutic activity, 97112- Neuromuscular re-education, [phone]- Self Care, 60454- Manual therapy, [phone]- Gait training, [phone]- Orthotic Initial, [phone]- Orthotic/Prosthetic subsequent, [phone]- Canalith repositioning, Patient/Family education, Balance training, Stair training, Taping, Joint mobilization, Spinal mobilization, Vestibular training, DME instructions, Wheelchair mobility training, Cryotherapy, and Moist heat  PLAN FOR NEXT SESSION:    5:29 PM, 07/10/23 Aminta Kales PT, DPT  Physical Therapist - [facility]  Outpatient Physical Therapy- Main Campus [phone]

## 2023-07-19 ENCOUNTER — Ambulatory Visit: Admitting: Occupational Therapy

## 2023-07-19 ENCOUNTER — Ambulatory Visit: Admitting: Physical Therapy

## 2023-07-19 DIAGNOSIS — M6281 Muscle weakness (generalized): Secondary | ICD-10-CM

## 2023-07-19 DIAGNOSIS — R2681 Unsteadiness on feet: Secondary | ICD-10-CM

## 2023-07-19 DIAGNOSIS — G931 Anoxic brain damage, not elsewhere classified: Secondary | ICD-10-CM

## 2023-07-19 DIAGNOSIS — R262 Difficulty in walking, not elsewhere classified: Secondary | ICD-10-CM

## 2023-07-19 DIAGNOSIS — R278 Other lack of coordination: Secondary | ICD-10-CM

## 2023-07-19 DIAGNOSIS — R2689 Other abnormalities of gait and mobility: Secondary | ICD-10-CM

## 2023-07-19 DIAGNOSIS — R269 Unspecified abnormalities of gait and mobility: Secondary | ICD-10-CM

## 2023-07-19 NOTE — Therapy (Signed)
 OUTPATIENT PHYSICAL THERAPY TREATMENT   Patient Name: Beverly [name] MRN: [id number] DOB:Aug 22, 1985, 38 y.o., female Today's Date: 07/19/2023  PCP: [facility] REFERRING PROVIDER: Rawland Caddy, MD  END OF SESSION:   PT End of Session - 07/19/23 1055     Visit Number 11    Number of Visits 25    Date for PT Re-Evaluation 08/22/23    Authorization Type UHC Dual Complete; Medicaid secondary    Authorization Time Period 05/30/23-08/22/23    Progress Note Due on Visit 10    PT Start Time 1102    PT Stop Time 1144    PT Time Calculation (min) 42 min    Equipment Utilized During Treatment Gait belt    Activity Tolerance Patient tolerated treatment well    Behavior During Therapy Flat affect;Impulsive;WFL for tasks assessed/performed           Past Medical History:  Diagnosis Date   Brain injury [facility])    CAD (coronary artery disease)    HLD (hyperlipidemia)    Hypertension    last pregnancy   Ischemic cardiomyopathy    STEMI (ST elevation myocardial infarction) (HCC) 09/2019   SCAD with aneurysmal dilation of proximal LAD.   Vaginal Pap smear, abnormal    when she was 38yo   Ventricular fibrillation (HCC) 09/23/2019   Past Surgical History:  Procedure Laterality Date   CARDIAC CATHETERIZATION     IR GASTROSTOMY TUBE MOD SED  10/07/2019   IR GASTROSTOMY TUBE REMOVAL  07/02/2020   IR REPLC GASTRO/COLONIC TUBE PERCUT W/FLUORO  11/03/2021   IR REPLC GASTRO/COLONIC TUBE PERCUT W/FLUORO  03/09/2023   LEFT HEART CATH AND CORONARY ANGIOGRAPHY N/A 09/23/2019   Procedure: LEFT HEART CATH AND CORONARY ANGIOGRAPHY;  Surgeon: Sammy Crisp, MD;  Location: [facility] INVASIVE CV LAB;  Service: Cardiovascular;  Laterality: N/A;   SUBQ ICD IMPLANT N/A 11/26/2020   Procedure: SUBQ ICD IMPLANT;  Surgeon: Boyce Byes, MD;  Location: [facility] INVASIVE CV LAB;  Service: Cardiovascular;  Laterality: N/A;   Patient Active Problem List   Diagnosis Date Noted    Apraxia 10/05/2021   S/P ICD (internal cardiac defibrillator) procedure 11/27/2020   Hx of Cardiac arrest (HCC) 11/26/2020   Abnormality of gait 03/16/2020   Sleep disturbance 03/16/2020   Oropharyngeal dysphagia 02/12/2020   Coronary artery disease involving native coronary artery of native heart without angina pectoris 12/12/2019   Ischemic cardiomyopathy 12/12/2019   Brain injury (HCC)    Prediabetes    Anoxic brain injury (HCC) 11/05/2019   Dysphagia 11/05/2019   Physical deconditioning 11/05/2019   Acute delirium 11/05/2019   Respiratory failure (HCC)    Encounter for central line placement    Ventricular fibrillation (HCC) 09/23/2019   Acute combined systolic and diastolic heart failure (HCC) 09/23/2019   Ventricular tachycardia, polymorphic (HCC) 09/23/2019   Pelvic pain affecting pregnancy 10/15/2015   Supervision of normal pregnancy in third trimester 09/28/2015   Poor weight gain of pregnancy 09/28/2015   Iron  deficiency anemia of pregnancy 09/01/2015   Increased BMI (body mass index) 07/29/2015    ONSET DATE: August 2021  REFERRING DIAG:  G93.1 (ICD-10-CM) - Anoxic brain injury (HCC)  R48.2 (ICD-10-CM) - Apraxia    THERAPY DIAG:   Other lack of coordination  Unsteadiness on feet  Difficulty in walking, not elsewhere classified  Muscle weakness (generalized)  Anoxic brain injury (HCC)  Other abnormalities of gait and mobility  Abnormality of gait and mobility  Rationale for Evaluation and  Treatment: Rehabilitation  SUBJECTIVE:                                                                                                                                                                                             SUBJECTIVE STATEMENT:   Pt reports she is doing good. Denies pain. Denies falls/stumbles. Pt and father report pt does not use AD at home; however, pt does have a RW. Father states he is unaware of any significant updates, he is not patient's  primary caregiver because she lives with her mother.  Pt accompanied by: self, father [name])  PERTINENT HISTORY:  Patient is a 38 year old with diagnois of anoxic brain injury. Pt known to PT clinic. Previously seen for outpatient services March 2024. Pt was receiving HH PT until October 2024 per her mother's report. [name] reports pt using HHA for ambulation, was using RW but she says was told not to do to difficulty with safe technique. Pt has fallen 1x in the past six months trying to ambulate on her own. Pt requires assistance with ADLs, including bathing and dressing. [name] reports pt never indicates any pain. Pt with severe aphasia so is unable to answer questions/pt's mom provides hx. She reports pt has impaired short-term memory, difficulty with stair climbing (has stair lift at home). She reports pt's BLE are weaker than they used to be. The pt is probably limited to <10 min with ambulation due to poor endurance. Pt reports her goal is to be able to use an AD to achieve mod-I mobility rather than relying on someone's HHA to ambulate.She was receiving outpatient PT until Feb. 2022 last year but ran out of visits. Patient has past medical history of Anoxic Brain Injury 09/2019, CAD, HLD, HTN, ISCHEMIC CARDIMYOPATHY, STEMI, V-FIB, ICD on 11/29/2020. Patient is currently living with mom. Mother reports she is abel to walk some with a walker with supervision but primarily ambulates with HHA of mother who is her guardian. Mother also reports she requires assist for all transfers and ADL's.   PAIN:  Are you having pain? No  PRECAUTIONS: Fall  WEIGHT BEARING RESTRICTIONS: No  FALLS: Has patient fallen in last 6 months? Yes. Number of falls 1  LIVING ENVIRONMENT: Lives with: lives with their family, mother Lives in: House/apartment Stairs: has stairs but stair lift installed, pt using lift Has following equipment at home: Environmental consultant - 2 wheeled, Wheelchair (manual), shower chair, and  Grab bars, pt uses stair lift  PLOF: Needs assistance with ADLs  PATIENT GOALS: balance, walking, and pt states basketball  OBJECTIVE:  Note: Objective measures were completed at  Evaluation unless otherwise noted.  DIAGNOSTIC FINDINGS:  None recent in chart  COGNITION: Overall cognitive status: impaired   SENSATION: Unable to complete testing due to impaired cognition, pt unable to report if sensation impairment present, pt mother unsure   EDEMA:  Mom reports pt's feet do swell from time to time   LOWER EXTREMITY MMT:  *accuracy of testing likely impacted by pt difficulty following cues  MMT Right Eval Left Eval  Hip flexion 4+ 4+  Hip extension    Hip abduction 4+ 4+  Hip adduction 4+ 4+  Hip internal rotation    Hip external rotation    Knee flexion 4+ 4+  Knee extension 5 5  Ankle dorsiflexion    Ankle plantarflexion    Ankle inversion    Ankle eversion    (Blank rows = not tested)  TRANSFERS: HHA  tried in session, very unsteady, pt has difficulty with motor planning, little success with cues due to distraction, very unsafe Attempted in session with RW, pt unsteady, gait ataxic, continued significant difficulty with motor planning requiring up to min a + 2 and multi-modal cues to safely sit in chair.   FUNCTIONAL TESTS:  5 times sit to stand: 1 min 43 sec Timed up and go (TUG): 1 min 30 sec with RW 10 meter walk test: 0.25 m/s with RW extensive cuing to complete activity  PATIENT SURVEYS:  SIS-16 filled out by pt's mother, used as questions relevant to pt: 45                                                                                                                              TREATMENT DATE: 07/19/2023   Unless otherwise stated, at least light min A was provided and gait belt donned in order to ensure pt safety throughout session.  R stand pivot transport chair>standard green chair with armrests using RW with skilled light min A - pt demonstrating  significant improvement in motor planning when turning to sit at this time; however, later in session with fatigue or when turning to sit at the end of gait, her transfer technique significantly declines.  Gait training ~58ft + ~29ft + ~60ft using RW with skilled min A for balance and only verbal/visual cuing for proper AD management until patient turning to sit in chair at end of gait.  Pt continues to have tendency to push AD too far away from her and have it veer towards R Pt intermittently kicks L back leg of RW with L LE due to it being excessive abducted and mildly externally rotated Improved AD management when turning through doorways today compared to last time seen by this therapist Pt does require frequent cuing throughout gait to attend to poor AD management, but improved ability to know how to correct the mistake when attention is brought to it  Set-up environment for patient to practice turning to sit in chair at end of each gait trial to improve  sequencing and AD management with this; however, pt continues to be significantly inconsistent with this and doesn't turn fully nor bring AD with her despite multiple attempts at improving her motor planning and sequencing.    Participated in Timed Up and Go (TUG): 1st trial: 49.84 seconds 2nd trial: 46.69 seconds  Average: 48.265 seconds using RW with skilled min A primarily for AD management with pt having difficulty turning with AD Patient demonstrates high fall risk as indicated by requiring >13.5seconds to complete the TUG.    Gait training ~1ft, no AD, with therapist on R side and +2 skilled assist on L side for safety requiring skilled heavy min/light mod A for balance. Pt demonstrating the following gait deviations with therapist providing the described cuing and facilitation for improvement:  High guard upper body posturing Wide BOS with short step lengths, improves with repetition and with therapist facilitating improved wt shift  over stance limbs to improve contralateral swing Significantly decreased gait speed with frequent pauses in double limb support 2x pt reaches for R HHA due to feeling imbalanced  Noticed pt with greatest difficulty stepping backwards to seat when going to sit at end of gait trials.   Gait training ~43ft x2 forward/backards with pt requiring +2 to provide visual demonstration for how to step backwards while therapist provides skilled heavy mod A to facilitate wt shifting onto stance limbs, especially wt shifting L in order to pick-up and step R LE posteriorly Pt demos significant influence of tone on her movements with some shaking in knees when stepping back Pt very unsure of backwards gait, but would benefit from continued practice with this    PATIENT EDUCATION: Education details: goal reassessment, indications, exercise technique Person educated: Patient and Parent Education method: Explanation, Demonstration, Tactile cues, and Verbal cues Education comprehension: returned demonstration, verbal cues required, tactile cues required, and needs further education  HOME EXERCISE PROGRAM: Access Code: [email or web address] URL: [email or web address] Date: 06/11/2023 Prepared by: Aurora Lees  Exercises - Standing March with Counter Support  - 1 x daily - 7 x weekly - 2 sets - 8 reps - Side Stepping with Counter Support  - 1 x daily - 7 x weekly - 2 sets - 4 reps - Sit to Stand with Armchair  - 1 x daily - 7 x weekly - 2 sets - 5 reps - Seated March  - 1 x daily - 7 x weekly - 3 sets - 10 reps  GOALS: Goals reviewed with patient? Yes   SHORT TERM GOALS: Target date: 07/11/2023  Patient will be independent in home exercise program to improve strength/mobility for better functional independence with ADLs. Baseline: Goal status: INITIAL   LONG TERM GOALS: Target date: 08/22/2023   1.  Patient will complete five times sit to stand test in < 35 seconds indicating an increased LE  strength and improved balance. Baseline: previously 37 sec with UUE 04/19/22 today 05/30/23: 1 min 43 seconds; 6/3: 41 sec with improved technique (use of BUE on armrests)  Goal status: PARTIALLY MET  2. Patient will demo ability to ambulate > 400 ft w/ LRAD versus hand held assist min assist to demonstrate improved household mobility and short community distances.  Baseline: 4/28: 167ft with RW. Mod assist for AD management to prevent veer to the R. 6/3: ~ 300 ft with RW, requires cuing throughout for proximity to RW particularly with turns, but this has improved compared to eval Goal status: IN PROGRESS  3.  Patient will increase 10 meter walk  test to >1.80m/s as to improve gait speed for better community ambulation and to reduce fall risk. Baseline: previously 0.36 m/s on 04/19/22 : 0.25 m/s;  07/10/23: 0. 24 m/s with RW  Goal status: INITIAL  4.  Patient will reduce timed up and go (TUG) to <50 seconds to reduce fall risk and demonstrate improved transfer/gait ability Baseline: previously 91 sec with RW on 04/19/22; 05/30/23: 1 min 30 sec with RW 07/19/2023: 48.265 seconds using RW with skilled min A primarily for AD management with pt having difficulty turning with AD Goal status: IN PROGRESS  5. Pt will exhibit correct and safe technique with stand>sit into a standard chair or transport chair to reduce risk of falling.   Baseline: pt current technique unsafe, poor motor planning; 6/3: pt with improved technique, bring RW closer to chair prior to sitting  Goal status: PARTIALLY MET   6. The pt will demonstrate at least a 15 point improvement on SIS-16 to indicate increased ease with ADLs.  Baseline: 45 (filled out by pt's mother); 42  Goal status: ONGOING  ASSESSMENT:  CLINICAL IMPRESSION:  Therapy session focused on further gait training using RW to promote patient achieving increased independence with functional mobility and pt demonstrating improving ability to correct AD management when  cued to attend to it during open space gait navigation, but does rely on that cuing to make her aware. However, pt continues to demonstrate greatest difficulty motor planning/sequencing turning, stepping back to chair, and keeping AD close to her when turning to sit at end of gait with poor ability to correct despite max multimodal cuing. Noticed pt with greatest challenge when stepping backwards to seat and therefore participated in backwards gait training with pt having significant influence from her tone during this movement requiring heavy skilled mod A and +2 visual demonstration to be successful. Patient will benefit from further higher level dynamic stepping and gait interventions to promote neural change. Pt will continue to benefit from skilled therapy to address remaining deficits in order to improve overall QoL and return to PLOF.     OBJECTIVE IMPAIRMENTS: Abnormal gait, decreased activity tolerance, decreased balance, decreased coordination, decreased endurance, decreased knowledge of use of DME, decreased mobility, difficulty walking, decreased strength, decreased safety awareness, increased edema, impaired tone, improper body mechanics, and postural dysfunction.   ACTIVITY LIMITATIONS: carrying, lifting, bending, standing, squatting, stairs, transfers, bathing, dressing, locomotion level, and caring for others  PARTICIPATION LIMITATIONS: meal prep, cleaning, laundry, medication management, personal finances, driving, shopping, community activity, occupation, and yard work  PERSONAL FACTORS: Sex, Time since onset of injury/illness/exacerbation, and 1-2 comorbidities: per chart PMH includes CAD, HTN, ischemic cardiomyopathy, STEMI, ventricular fibrillation, dysphagia, s/p ICD, apraxia  are also affecting patient's functional outcome.   REHAB POTENTIAL: Fair    CLINICAL DECISION MAKING: Evolving/moderate complexity  EVALUATION COMPLEXITY: High  PLAN:  PT FREQUENCY: 1-2x/week  PT  DURATION: 12 weeks  PLANNED INTERVENTIONS: 97164- PT Re-evaluation, 97750- Physical Performance Testing, 97110-Therapeutic exercises, 97530- Therapeutic activity, [id number]- Neuromuscular re-education, 97535- Self Care, [id number]- Manual therapy, [phone]- Gait training, [phone]- Orthotic Initial, [phone]- Orthotic/Prosthetic subsequent, [phone]- Canalith repositioning, Patient/Family education, Balance training, Stair training, Taping, Joint mobilization, Spinal mobilization, Vestibular training, DME instructions, Wheelchair mobility training, Cryotherapy, and Moist heat  PLAN FOR NEXT SESSION:  - backwards gait training with +2 skilled assistance when available - dynamic stepping in variable directions - block practice stand pivot transfers? (Place 2 chairs at 109 degreess?) - address whether pt will use RW at home or not  and continue use of this AD vs training balance/gait without it      Kendyll Huettner, PT, DPT, NCS, CSRS Physical Therapist - [facility]  [facility]  11:48 AM 07/19/23

## 2023-07-19 NOTE — Therapy (Addendum)
 OUTPATIENT OCCUPATIONAL THERAPY NEURO EVALUATION  Patient Name: Beverly [name] MRN: [id number] DOB:29-Dec-1985, 38 y.o., female Today's Date: 07/19/2023   REFERRING PROVIDER: Abelino Able, MD  END OF SESSION:  OT End of Session - 07/19/23 1115     Visit Number 1    Number of Visits 12    Date for OT Re-Evaluation 10/11/23    OT Start Time 1015    OT Stop Time 1100    OT Time Calculation (min) 45 min    Activity Tolerance Patient tolerated treatment well    Behavior During Therapy Flat affect;Impulsive;WFL for tasks assessed/performed          Past Medical History:  Diagnosis Date   Brain injury [facility])    CAD (coronary artery disease)    HLD (hyperlipidemia)    Hypertension    last pregnancy   Ischemic cardiomyopathy    STEMI (ST elevation myocardial infarction) (HCC) 09/2019   SCAD with aneurysmal dilation of proximal LAD.   Vaginal Pap smear, abnormal    when she was 38yo   Ventricular fibrillation (HCC) 09/23/2019   Past Surgical History:  Procedure Laterality Date   CARDIAC CATHETERIZATION     IR GASTROSTOMY TUBE MOD SED  10/07/2019   IR GASTROSTOMY TUBE REMOVAL  07/02/2020   IR REPLC GASTRO/COLONIC TUBE PERCUT W/FLUORO  11/03/2021   IR REPLC GASTRO/COLONIC TUBE PERCUT W/FLUORO  03/09/2023   LEFT HEART CATH AND CORONARY ANGIOGRAPHY N/A 09/23/2019   Procedure: LEFT HEART CATH AND CORONARY ANGIOGRAPHY;  Surgeon: Sammy Crisp, MD;  Location: [facility] INVASIVE CV LAB;  Service: Cardiovascular;  Laterality: N/A;   SUBQ ICD IMPLANT N/A 11/26/2020   Procedure: SUBQ ICD IMPLANT;  Surgeon: Boyce Byes, MD;  Location: [facility] INVASIVE CV LAB;  Service: Cardiovascular;  Laterality: N/A;   Patient Active Problem List   Diagnosis Date Noted   Apraxia 10/05/2021   S/P ICD (internal cardiac defibrillator) procedure 11/27/2020   Hx of Cardiac arrest (HCC) 11/26/2020   Abnormality of gait 03/16/2020   Sleep disturbance 03/16/2020   Oropharyngeal dysphagia 02/12/2020    Coronary artery disease involving native coronary artery of native heart without angina pectoris 12/12/2019   Ischemic cardiomyopathy 12/12/2019   Brain injury (HCC)    Prediabetes    Anoxic brain injury (HCC) 11/05/2019   Dysphagia 11/05/2019   Physical deconditioning 11/05/2019   Acute delirium 11/05/2019   Respiratory failure (HCC)    Encounter for central line placement    Ventricular fibrillation (HCC) 09/23/2019   Acute combined systolic and diastolic heart failure (HCC) 09/23/2019   Ventricular tachycardia, polymorphic (HCC) 09/23/2019   Pelvic pain affecting pregnancy 10/15/2015   Supervision of normal pregnancy in third trimester 09/28/2015   Poor weight gain of pregnancy 09/28/2015   Iron  deficiency anemia of pregnancy 09/01/2015   Increased BMI (body mass index) 07/29/2015    ONSET DATE: 09/23/19  REFERRING DIAG: Anoxic Brain Injury  THERAPY DIAG:  Muscle weakness (generalized)  Rationale for Evaluation and Treatment: Rehabilitation  SUBJECTIVE:   SUBJECTIVE STATEMENT:  Pt. Is present with the initial evaluation with her father. Pt accompanied by:  Father  PERTINENT HISTORY:   Pt. Is a 38 y.o. female with a history of severe aphasia  from a [place] Arrest leading to Anoxic Brain Injury on 09/23/19 at 2 weeks postpartum. Pt. Currently hs a G-Tube in place. PMHx includes: CAD, HLD, HTN, Ischemic cardiomyopathy, STEMI, V-Fib, and ICD on 11/29/20.  PRECAUTIONS: None  WEIGHT BEARING RESTRICTIONS: No  PAIN:  Are you  having pain? No  FALLS: Has patient fallen in last 6 months? No  LIVING ENVIRONMENT: Lives with: mother Lives in: House/apartment Stairs:  two story; No steps to enter. 10 steps inside, and has a chair lift Has following equipment at home: Walker - 2 wheeled and Wheelchair (manual), stair chair lift  PLOF: Independent  PATIENT GOALS: To improve bilateral hand function for increased engagement in ADLs, and IADL tasks.  OBJECTIVE:  Note:  Objective measures were completed at Evaluation unless otherwise noted.  HAND DOMINANCE: Left  ADLs: Per father report  Transfers/ambulation related to ADLs: Eating:  Dependent/feeding tube Grooming: Independent washing face, assist with hair care  UB Dressing: Assist with donning shirt LB Dressing: Assist with donning LE clothing  Toileting: Assist with transfers, toilet hygiene skills, and  clothing negotiation Bathing:  Unknown   IADLs: Per father report Shopping: Total A Light housekeeping: Total A Meal Prep: Total A Medication management:   Total A Financial management: Total A Handwriting: 50% legible printing name only using 1 inch letters.  MOBILITY STATUS: Needs Assist: using walker  POSTURE COMMENTS:   Sitting balance:  fair  ACTIVITY TOLERANCE: Activity tolerance:  Fair   FUNCTIONAL OUTCOME MEASURES:  TBD  UPPER EXTREMITY ROM:    Active ROM Right Eval WFL Left Eval [facility]  Shoulder flexion    Shoulder abduction    Shoulder adduction    Shoulder extension    Shoulder internal rotation    Shoulder external rotation    Elbow flexion    Elbow extension    Wrist flexion    Wrist extension    Wrist ulnar deviation    Wrist radial deviation    Wrist pronation    Wrist supination    (Blank rows = not tested)  UPPER EXTREMITY MMT:     MMT Right eval Left eval  Shoulder flexion 3/5 3+/5  Shoulder abduction 3/5 3+/5  Shoulder adduction    Shoulder extension    Shoulder internal rotation    Shoulder external rotation    Middle trapezius    Lower trapezius    Elbow flexion 3/5 3+/5  Elbow extension 3/5 3+/5  Wrist flexion    Wrist extension 3/5 3+/5  Wrist ulnar deviation    Wrist radial deviation    Wrist pronation    Wrist supination    (Blank rows = not tested)  HAND FUNCTION: Grip strength: Right: 10 lbs; Left: 18 lbs, Lateral pinch: Right: 4 lbs, Left: 7 lbs.  COORDINATION:  Nine Hole Peg test: Pt. was able to formulate isolated  2nd digit extension in anticipation for grasping the horizontal pegs.  SENSATION: Light touch intact  EDEMA: N/A   COGNITION: Overall cognitive status: impaired   VISION:  Baseline:  wears glasses.   VISION ASSESSMENT: To be further assessed in functional context  PERCEPTION: WFL  PRAXIS: WFL  TREATMENT DATE:   OT initial evaluation was complete, and Pt. education was provided as indicated below.    PATIENT EDUCATION: Education details:  OT services, POC, and goals Person educated: Patient and Parent Education method: Explanation, Demonstration, Tactile cues, and Verbal cues Education comprehension: verbalized understanding, returned demonstration, verbal cues required, and needs further education  HOME EXERCISE PROGRAM:   Continue in an ongoing basis, and provide as indicated.   GOALS: Goals reviewed with patient? Yes  SHORT TERM GOALS: Target date: 08/30/2023    Pt. Will require supervision HEPs for BUE strengthening  Baseline: Eval: No current HEP. Goal status: INITIAL   LONG TERM GOALS: Target date: 10/11/2023   Pt. Will increase BUE strength by 2 mm grades to assist with ADLs, and IADLs. Baseline:  Eval:  RUE: 3/5 overall, LUE: 3+/5 overall Goal status: INITIAL  2.  Pt. will improve bilateral grip strength by 5# to be able to securely hold items in her hands. Baseline:  Eval: Grip Right: 10#, Left: 18# Goal status: INITIAL  3.  Pt. Will improve bilateral lateral pinch strength by 3# to assist with grooming tasks. Baseline: Eval: R: 4#, L: 7# Goal status: INITIAL  4.  Pt. Will improve bilateral hand FMC grasping, and placing one peg in the 9 hole peg test in preparation for improved manipulation of small objects. Baseline: Eval: Pt. is able to initiate formulating isolated 2nd digit extension in preparation for grasping small  objects. Goal status: INITIAL  5.  Pt.  Will write her name her full name with 75% legibility. Baseline: Eval: 50% legible printing name only using 1 inch letters. Goal status: INITIAL  6. Pt.  Will demonstrate modified techniques for ADLs with minA.  Baseline: Eval: Does not currently use adaptive, or modified techniques during ADLS.    ASSESSMENT:  CLINICAL IMPRESSION:  Patient is a 38 y.o. female who was seen today for occupational therapy evaluation for Anoxic Brain Injury. Pt. Presents with severe aphasia, bilateral UE weakness, and impaired [facility] skills with hinder her ability to perform, and actively engage in ADLs, and IADL tasks.  Pt. will benefit from OT services to work on improving BUE strength, grip strength, pinch strength, and bilateral [facility] skills in order to be able to use, and manipulate objects for ADLs, and IADL tasks, improve writing tasks, and to review modifications  to assist with daily self-care tasks.   PERFORMANCE DEFICITS: in functional skills including ADLs, IADLs, coordination, dexterity, proprioception, ROM, strength, pain, Fine motor control, and UE functional use, cognitive skills including attention, memory, problem solving, and safety awareness, and psychosocial skills including coping strategies, environmental adaptation, and routines and behaviors.   IMPAIRMENTS: are limiting patient from ADLs, IADLs, and leisure.   CO-MORBIDITIES: may have co-morbidities  that affects occupational performance. Patient will benefit from skilled OT to address above impairments and improve overall function.  MODIFICATION OR ASSISTANCE TO COMPLETE EVALUATION: Min-Moderate modification of tasks or assist with assess necessary to complete an evaluation.  OT OCCUPATIONAL PROFILE AND HISTORY: Detailed assessment: Review of records and additional review of physical, cognitive, psychosocial history related to current functional performance.  CLINICAL DECISION MAKING: Moderate -  several treatment options, min-mod task modification necessary  REHAB POTENTIAL: Good  EVALUATION COMPLEXITY: Moderate    PLAN:  OT FREQUENCY: 1x/week  OT DURATION: 12 weeks  PLANNED INTERVENTIONS: 97535 self care/ADL training, 16109 therapeutic exercise, 97530 therapeutic activity, 97112 neuromuscular re-education, 97140 manual therapy, 97018 paraffin, 60454 moist heat, 97760 Orthotic Initial, [id number] Prosthetic Initial, [id number]  Orthotic/Prosthetic subsequent, functional mobility training, visual/perceptual remediation/compensation, energy conservation, patient/family education, and DME and/or AE instructions  RECOMMENDED OTHER SERVICES: PT/ST  CONSULTED AND AGREED WITH PLAN OF CARE: Patient  PLAN FOR NEXT SESSION: Treatment  Narjis Mira, MS, OTR/L  07/19/2023, 11:18 AM

## 2023-07-20 NOTE — Addendum Note (Signed)
 Addended by: Lott Rouleau A on: 07/20/2023 12:03 PM   Modules accepted: Orders

## 2023-07-20 NOTE — Progress Notes (Signed)
 Remote ICD transmission.

## 2023-07-23 NOTE — Therapy (Signed)
 OUTPATIENT PHYSICAL THERAPY TREATMENT   Patient Name: Beverly [name] MRN: [id number] DOB:[birth year], 38 y.o., female Today's Date: 07/25/2023  PCP: [facility] REFERRING PROVIDER: Rawland Caddy, MD  END OF SESSION:   PT End of Session - 07/24/23 1338     Visit Number 12    Number of Visits 25    Date for PT Re-Evaluation 08/22/23    Authorization Type UHC Dual Complete; Medicaid secondary    Authorization Time Period 05/30/23-08/22/23    Progress Note Due on Visit 10    PT Start Time 1402    PT Stop Time 1444    PT Time Calculation (min) 42 min    Equipment Utilized During Treatment Gait belt    Activity Tolerance Patient tolerated treatment well    Behavior During Therapy Flat affect;Impulsive;WFL for tasks assessed/performed            Past Medical History:  Diagnosis Date   Brain injury [facility])    CAD (coronary artery disease)    HLD (hyperlipidemia)    Hypertension    last pregnancy   Ischemic cardiomyopathy    STEMI (ST elevation myocardial infarction) (HCC) 09/2019   SCAD with aneurysmal dilation of proximal LAD.   Vaginal Pap smear, abnormal    when she was 38yo   Ventricular fibrillation (HCC) 09/23/2019   Past Surgical History:  Procedure Laterality Date   CARDIAC CATHETERIZATION     IR GASTROSTOMY TUBE MOD SED  10/07/2019   IR GASTROSTOMY TUBE REMOVAL  07/02/2020   IR REPLC GASTRO/COLONIC TUBE PERCUT W/FLUORO  11/03/2021   IR REPLC GASTRO/COLONIC TUBE PERCUT W/FLUORO  03/09/2023   LEFT HEART CATH AND CORONARY ANGIOGRAPHY N/A 09/23/2019   Procedure: LEFT HEART CATH AND CORONARY ANGIOGRAPHY;  Surgeon: Sammy Crisp, MD;  Location: [facility] INVASIVE CV LAB;  Service: Cardiovascular;  Laterality: N/A;   SUBQ ICD IMPLANT N/A 11/26/2020   Procedure: SUBQ ICD IMPLANT;  Surgeon: Boyce Byes, MD;  Location: [facility] INVASIVE CV LAB;  Service: Cardiovascular;  Laterality: N/A;   Patient Active Problem List   Diagnosis Date Noted    Apraxia 10/05/2021   S/P ICD (internal cardiac defibrillator) procedure 11/27/2020   Hx of Cardiac arrest (HCC) 11/26/2020   Abnormality of gait 03/16/2020   Sleep disturbance 03/16/2020   Oropharyngeal dysphagia 02/12/2020   Coronary artery disease involving native coronary artery of native heart without angina pectoris 12/12/2019   Ischemic cardiomyopathy 12/12/2019   Brain injury (HCC)    Prediabetes    Anoxic brain injury (HCC) 11/05/2019   Dysphagia 11/05/2019   Physical deconditioning 11/05/2019   Acute delirium 11/05/2019   Respiratory failure (HCC)    Encounter for central line placement    Ventricular fibrillation (HCC) 09/23/2019   Acute combined systolic and diastolic heart failure (HCC) 09/23/2019   Ventricular tachycardia, polymorphic (HCC) 09/23/2019   Pelvic pain affecting pregnancy 10/15/2015   Supervision of normal pregnancy in third trimester 09/28/2015   Poor weight gain of pregnancy 09/28/2015   Iron  deficiency anemia of pregnancy 09/01/2015   Increased BMI (body mass index) 07/29/2015    ONSET DATE: August 2021  REFERRING DIAG:  G93.1 (ICD-10-CM) - Anoxic brain injury (HCC)  R48.2 (ICD-10-CM) - Apraxia    THERAPY DIAG:   Muscle weakness (generalized)  Unsteadiness on feet  Difficulty in walking, not elsewhere classified  Other abnormalities of gait and mobility  Rationale for Evaluation and Treatment: Rehabilitation  SUBJECTIVE:  SUBJECTIVE STATEMENT:   Mom reports she had some weakness in her legs today when getting dressed.   Pt accompanied by: self, father [name])  PERTINENT HISTORY:  Patient is a 38 year old with diagnois of anoxic brain injury. Pt known to PT clinic. Previously seen for outpatient services March 2024. Pt was receiving HH PT until October  2024 per her mother's report. [name] reports pt using HHA for ambulation, was using RW but she says was told not to do to difficulty with safe technique. Pt has fallen 1x in the past six months trying to ambulate on her own. Pt requires assistance with ADLs, including bathing and dressing. [name] reports pt never indicates any pain. Pt with severe aphasia so is unable to answer questions/pt's mom provides hx. She reports pt has impaired short-term memory, difficulty with stair climbing (has stair lift at home). She reports pt's BLE are weaker than they used to be. The pt is probably limited to <10 min with ambulation due to poor endurance. Pt reports her goal is to be able to use an AD to achieve mod-I mobility rather than relying on someone's HHA to ambulate.She was receiving outpatient PT until Feb. 2022 last year but ran out of visits. Patient has past medical history of Anoxic Brain Injury 09/2019, CAD, HLD, HTN, ISCHEMIC CARDIMYOPATHY, STEMI, V-FIB, ICD on 11/29/2020. Patient is currently living with mom. Mother reports she is abel to walk some with a walker with supervision but primarily ambulates with HHA of mother who is her guardian. Mother also reports she requires assist for all transfers and ADL's.   PAIN:  Are you having pain? No  PRECAUTIONS: Fall  WEIGHT BEARING RESTRICTIONS: No  FALLS: Has patient fallen in last 6 months? Yes. Number of falls 1  LIVING ENVIRONMENT: Lives with: lives with their family, mother Lives in: House/apartment Stairs: has stairs but stair lift installed, pt using lift Has following equipment at home: Environmental consultant - 2 wheeled, Wheelchair (manual), shower chair, and Grab bars, pt uses stair lift  PLOF: Needs assistance with ADLs  PATIENT GOALS: balance, walking, and pt states basketball  OBJECTIVE:  Note: Objective measures were completed at Evaluation unless otherwise noted.  DIAGNOSTIC FINDINGS:  None recent in chart  COGNITION: Overall  cognitive status: impaired   SENSATION: Unable to complete testing due to impaired cognition, pt unable to report if sensation impairment present, pt mother unsure   EDEMA:  Mom reports pt's feet do swell from time to time   LOWER EXTREMITY MMT:  *accuracy of testing likely impacted by pt difficulty following cues  MMT Right Eval Left Eval  Hip flexion 4+ 4+  Hip extension    Hip abduction 4+ 4+  Hip adduction 4+ 4+  Hip internal rotation    Hip external rotation    Knee flexion 4+ 4+  Knee extension 5 5  Ankle dorsiflexion    Ankle plantarflexion    Ankle inversion    Ankle eversion    (Blank rows = not tested)  TRANSFERS: HHA  tried in session, very unsteady, pt has difficulty with motor planning, little success with cues due to distraction, very unsafe Attempted in session with RW, pt unsteady, gait ataxic, continued significant difficulty with motor planning requiring up to min a + 2 and multi-modal cues to safely sit in chair.   FUNCTIONAL TESTS:  5 times sit to stand: 1 min 43 sec Timed up and go (TUG): 1 min 30 sec with RW 10 meter walk  test: 0.25 m/s with RW extensive cuing to complete activity  PATIENT SURVEYS:  SIS-16 filled out by pt's mother, used as questions relevant to pt: 45                                                                                                                              TREATMENT DATE: 07/25/2023   Unless otherwise stated, at least light min A was provided and gait belt donned in order to ensure pt safety throughout session.  R stand pivot transport chair>standard green chair with armrests using RW with skilled light min A - pt demonstrating significant improvement in motor planning when turning to sit at this time; however, later in session with fatigue or when turning to sit at the end of gait, her transfer technique significantly declines.  Ambulate 150 ft ; x2 second with 3lb AW on BLE; heavy guidance for walker positioning  required. X2 person assist; one person guarding, one person leading walker. Max cueing for bringing feet closer to walk, upright posture, and task orientation required.   Stand with attempts at grabbing and passing ball: min A to maintain upright posture due to posterior LOB; x 2 sets of 3 minutes; max cueing for visual scan and task orientation   Seated soccer with 3lb AW for coordination,spatial awareness, and sequencing. X multiple reps    Backwards stepping to chair with max cueing via visual, tactile, and verbal for sequencing.   Seated blaze pods Activity Description: seated with 3 pods in front Activity Setting:  The Blaze Pod Random setting was chosen to enhance cognitive processing and agility, providing an unpredictable environment to simulate real-world scenarios, and fostering quick reactions and adaptability.   Number of Pods:  3 Cycles/Sets:  3 Duration (Time or Hit Count):  10     PATIENT EDUCATION: Education details: goal reassessment, indications, exercise technique Person educated: Patient and Parent Education method: Explanation, Demonstration, Tactile cues, and Verbal cues Education comprehension: returned demonstration, verbal cues required, tactile cues required, and needs further education  HOME EXERCISE PROGRAM: Access Code: [email or web address] URL: [email or web address] Date: 06/11/2023 Prepared by: Aurora Lees  Exercises - Standing March with Counter Support  - 1 x daily - 7 x weekly - 2 sets - 8 reps - Side Stepping with Counter Support  - 1 x daily - 7 x weekly - 2 sets - 4 reps - Sit to Stand with Armchair  - 1 x daily - 7 x weekly - 2 sets - 5 reps - Seated March  - 1 x daily - 7 x weekly - 3 sets - 10 reps  GOALS: Goals reviewed with patient? Yes   SHORT TERM GOALS: Target date: 07/11/2023  Patient will be independent in home exercise program to improve strength/mobility for better functional independence with ADLs. Baseline: Goal status:  INITIAL   LONG TERM GOALS: Target date: 08/22/2023   1.  Patient will complete five times sit to stand test in <  35 seconds indicating an increased LE strength and improved balance. Baseline: previously 37 sec with UUE 04/19/22 today 05/30/23: 1 min 43 seconds; 6/3: 41 sec with improved technique (use of BUE on armrests)  Goal status: PARTIALLY MET  2. Patient will demo ability to ambulate > 400 ft w/ LRAD versus hand held assist min assist to demonstrate improved household mobility and short community distances.  Baseline: 4/28: 158ft with RW. Mod assist for AD management to prevent veer to the R. 6/3: ~ 300 ft with RW, requires cuing throughout for proximity to RW particularly with turns, but this has improved compared to eval Goal status: IN PROGRESS  3.  Patient will increase 10 meter walk test to >1.45m/s as to improve gait speed for better community ambulation and to reduce fall risk. Baseline: previously 0.36 m/s on 04/19/22 : 0.25 m/s;  07/10/23: 0. 24 m/s with RW  Goal status: INITIAL  4.  Patient will reduce timed up and go (TUG) to <50 seconds to reduce fall risk and demonstrate improved transfer/gait ability Baseline: previously 91 sec with RW on 04/19/22; 05/30/23: 1 min 30 sec with RW 07/19/2023: 48.265 seconds using RW with skilled min A primarily for AD management with pt having difficulty turning with AD Goal status: IN PROGRESS  5. Pt will exhibit correct and safe technique with stand>sit into a standard chair or transport chair to reduce risk of falling.   Baseline: pt current technique unsafe, poor motor planning; 6/3: pt with improved technique, bring RW closer to chair prior to sitting  Goal status: PARTIALLY MET   6. The pt will demonstrate at least a 15 point improvement on SIS-16 to indicate increased ease with ADLs.  Baseline: 45 (filled out by pt's mother); 42  Goal status: ONGOING  ASSESSMENT:  CLINICAL IMPRESSION:  Patient requires multimodal cueing for task  orientation and spatial awareness. She does well with interventions that require reflexes such as seated soccer ball kicks and is eager to participate with use of balls and toy type objects. Multimodal cueing required for foot placement with forward and backwards stepping. The patient demonstrated significant progress while utilizing [employer], showcasing improved coordination, balance, and cognitive function. The incorporation of dual-tasking technology with color recognition and association with specific movements in Blaze Pods was strategically chosen to provide a dynamic training environment, enabling the patient to engage in simultaneous physical and cognitive tasks. This unique approach enhances not only their physical abilities but also fosters increased neural connectivity and mental awareness, contributing to a well-rounded and effective rehabilitation and training experience.   Pt will continue to benefit from skilled therapy to address remaining deficits in order to improve overall QoL and return to PLOF.     OBJECTIVE IMPAIRMENTS: Abnormal gait, decreased activity tolerance, decreased balance, decreased coordination, decreased endurance, decreased knowledge of use of DME, decreased mobility, difficulty walking, decreased strength, decreased safety awareness, increased edema, impaired tone, improper body mechanics, and postural dysfunction.   ACTIVITY LIMITATIONS: carrying, lifting, bending, standing, squatting, stairs, transfers, bathing, dressing, locomotion level, and caring for others  PARTICIPATION LIMITATIONS: meal prep, cleaning, laundry, medication management, personal finances, driving, shopping, community activity, occupation, and yard work  PERSONAL FACTORS: Sex, Time since onset of injury/illness/exacerbation, and 1-2 comorbidities: per chart PMH includes CAD, HTN, ischemic cardiomyopathy, STEMI, ventricular fibrillation, dysphagia, s/p ICD, apraxia  are also affecting patient's  functional outcome.   REHAB POTENTIAL: Fair    CLINICAL DECISION MAKING: Evolving/moderate complexity  EVALUATION COMPLEXITY: High  PLAN:  PT FREQUENCY:  1-2x/week  PT DURATION: 12 weeks  PLANNED INTERVENTIONS: 97164- PT Re-evaluation, 97750- Physical Performance Testing, 97110-Therapeutic exercises, 97530- Therapeutic activity, [id number]- Neuromuscular re-education, 97535- Self Care, 65784- Manual therapy, [phone]- Gait training, [phone]- Orthotic Initial, [phone]- Orthotic/Prosthetic subsequent, [phone]- Canalith repositioning, Patient/Family education, Balance training, Stair training, Taping, Joint mobilization, Spinal mobilization, Vestibular training, DME instructions, Wheelchair mobility training, Cryotherapy, and Moist heat  PLAN FOR NEXT SESSION:  - backwards gait training with +2 skilled assistance when available - dynamic stepping in variable directions - block practice stand pivot transfers? (Place 2 chairs at 39 degreess?) - address whether pt will use RW at home or not and continue use of this AD vs training balance/gait without it     Lyra Alaimo  Brain Cahill, PT, DPT Physical Therapist - [facility]  Outpatient Physical Therapy- Main Campus [phone]    8:14 AM 07/25/23

## 2023-07-24 ENCOUNTER — Ambulatory Visit

## 2023-07-24 ENCOUNTER — Ambulatory Visit: Admitting: Physical Therapy

## 2023-07-24 ENCOUNTER — Ambulatory Visit: Admitting: Occupational Therapy

## 2023-07-24 DIAGNOSIS — R2681 Unsteadiness on feet: Secondary | ICD-10-CM

## 2023-07-24 DIAGNOSIS — M6281 Muscle weakness (generalized): Secondary | ICD-10-CM

## 2023-07-24 DIAGNOSIS — R278 Other lack of coordination: Secondary | ICD-10-CM | POA: Diagnosis not present

## 2023-07-24 DIAGNOSIS — R262 Difficulty in walking, not elsewhere classified: Secondary | ICD-10-CM

## 2023-07-24 DIAGNOSIS — R2689 Other abnormalities of gait and mobility: Secondary | ICD-10-CM

## 2023-07-24 NOTE — Therapy (Signed)
 OUTPATIENT OCCUPATIONAL THERAPY NEURO TREATMENT  Patient Name: Beverly [name] MRN: [id number] DOB:[birth year], 38 y.o., female Today's Date: 07/24/2023   REFERRING PROVIDER: Abelino Able, MD  END OF SESSION:  OT End of Session - 07/24/23 1431     Visit Number 2    Number of Visits 12    Date for OT Re-Evaluation 10/11/23    OT Start Time 1315    OT Stop Time 1400    OT Time Calculation (min) 45 min    Activity Tolerance Patient tolerated treatment well    Behavior During Therapy Flat affect;Impulsive;WFL for tasks assessed/performed          Past Medical History:  Diagnosis Date   Brain injury [facility])    CAD (coronary artery disease)    HLD (hyperlipidemia)    Hypertension    last pregnancy   Ischemic cardiomyopathy    STEMI (ST elevation myocardial infarction) (HCC) 09/2019   SCAD with aneurysmal dilation of proximal LAD.   Vaginal Pap smear, abnormal    when she was 38yo   Ventricular fibrillation (HCC) 09/23/2019   Past Surgical History:  Procedure Laterality Date   CARDIAC CATHETERIZATION     IR GASTROSTOMY TUBE MOD SED  10/07/2019   IR GASTROSTOMY TUBE REMOVAL  07/02/2020   IR REPLC GASTRO/COLONIC TUBE PERCUT W/FLUORO  11/03/2021   IR REPLC GASTRO/COLONIC TUBE PERCUT W/FLUORO  03/09/2023   LEFT HEART CATH AND CORONARY ANGIOGRAPHY N/A 09/23/2019   Procedure: LEFT HEART CATH AND CORONARY ANGIOGRAPHY;  Surgeon: Sammy Crisp, MD;  Location: [facility] INVASIVE CV LAB;  Service: Cardiovascular;  Laterality: N/A;   SUBQ ICD IMPLANT N/A 11/26/2020   Procedure: SUBQ ICD IMPLANT;  Surgeon: Boyce Byes, MD;  Location: [facility] INVASIVE CV LAB;  Service: Cardiovascular;  Laterality: N/A;   Patient Active Problem List   Diagnosis Date Noted   Apraxia 10/05/2021   S/P ICD (internal cardiac defibrillator) procedure 11/27/2020   Hx of Cardiac arrest (HCC) 11/26/2020   Abnormality of gait 03/16/2020   Sleep disturbance 03/16/2020   Oropharyngeal dysphagia 02/12/2020    Coronary artery disease involving native coronary artery of native heart without angina pectoris 12/12/2019   Ischemic cardiomyopathy 12/12/2019   Brain injury (HCC)    Prediabetes    Anoxic brain injury (HCC) 11/05/2019   Dysphagia 11/05/2019   Physical deconditioning 11/05/2019   Acute delirium 11/05/2019   Respiratory failure (HCC)    Encounter for central line placement    Ventricular fibrillation (HCC) 09/23/2019   Acute combined systolic and diastolic heart failure (HCC) 09/23/2019   Ventricular tachycardia, polymorphic (HCC) 09/23/2019   Pelvic pain affecting pregnancy 10/15/2015   Supervision of normal pregnancy in third trimester 09/28/2015   Poor weight gain of pregnancy 09/28/2015   Iron  deficiency anemia of pregnancy 09/01/2015   Increased BMI (body mass index) 07/29/2015    ONSET DATE: 09/23/19  REFERRING DIAG: Anoxic Brain Injury  THERAPY DIAG:  Muscle weakness (generalized)  Rationale for Evaluation and Treatment: Rehabilitation  SUBJECTIVE:   SUBJECTIVE STATEMENT:  Pt accompanied by:  Father  PERTINENT HISTORY:   Pt. Is a 38 y.o. female with a history of severe aphasia  from a Cradiac Arrest leading to Anoxic Brain Injury on 09/23/19 at 2 weeks postpartum. Pt. Currently hs a G-Tube in place. PMHx includes: CAD, HLD, HTN, Ischemic cardiomyopathy, STEMI, V-Fib, and ICD on 11/29/20.  PRECAUTIONS: None  WEIGHT BEARING RESTRICTIONS: No  PAIN:  Are you having pain? No  FALLS: Has patient fallen in last  6 months? No  LIVING ENVIRONMENT: Lives with: mother Lives in: House/apartment Stairs:  two story; No steps to enter. 10 steps inside, and has a chair lift Has following equipment at home: Walker - 2 wheeled and Wheelchair (manual), stair chair lift  PLOF: Independent  PATIENT GOALS: To improve bilateral hand function for increased engagement in ADLs, and IADL tasks.  OBJECTIVE:  Note: Objective measures were completed at Evaluation unless otherwise  noted.  HAND DOMINANCE: Left  ADLs: Per father report  Transfers/ambulation related to ADLs: Eating:  Dependent/feeding tube Grooming: Independent washing face, assist with hair care  UB Dressing: Assist with donning shirt LB Dressing: Assist with donning LE clothing  Toileting: Assist with transfers, toilet hygiene skills, and  clothing negotiation Bathing:  Unknown   IADLs: Per father report Shopping: Total A Light housekeeping: Total A Meal Prep: Total A Medication management:   Total A Financial management: Total A Handwriting: 50% legible printing name only using 1 inch letters.  MOBILITY STATUS: Needs Assist: using walker  POSTURE COMMENTS:   Sitting balance:  fair  ACTIVITY TOLERANCE: Activity tolerance:  Fair   FUNCTIONAL OUTCOME MEASURES:  TBD  UPPER EXTREMITY ROM:    Active ROM Right Eval WFL Left Eval [facility]  Shoulder flexion    Shoulder abduction    Shoulder adduction    Shoulder extension    Shoulder internal rotation    Shoulder external rotation    Elbow flexion    Elbow extension    Wrist flexion    Wrist extension    Wrist ulnar deviation    Wrist radial deviation    Wrist pronation    Wrist supination    (Blank rows = not tested)  UPPER EXTREMITY MMT:     MMT Right eval Left eval  Shoulder flexion 3/5 3+/5  Shoulder abduction 3/5 3+/5  Shoulder adduction    Shoulder extension    Shoulder internal rotation    Shoulder external rotation    Middle trapezius    Lower trapezius    Elbow flexion 3/5 3+/5  Elbow extension 3/5 3+/5  Wrist flexion    Wrist extension 3/5 3+/5  Wrist ulnar deviation    Wrist radial deviation    Wrist pronation    Wrist supination    (Blank rows = not tested)  HAND FUNCTION: Grip strength: Right: 10 lbs; Left: 18 lbs, Lateral pinch: Right: 4 lbs, Left: 7 lbs.  COORDINATION:  Nine Hole Peg test: Pt. was able to formulate isolated 2nd digit extension in anticipation for grasping the horizontal  pegs.  SENSATION: Light touch intact  EDEMA: N/A   COGNITION: Overall cognitive status: impaired   VISION:  Baseline:  wears glasses.   VISION ASSESSMENT: To be further assessed in functional context  PERCEPTION: WFL  PRAXIS: WFL  TREATMENT DATE: 07/24/2023  Therapeutic Ex.:   -Performed BUE strengthening using a 2# dowel ex. 2/2 to weakness. Bilateral shoulder flexion, chest press, circular patterns for 1 set 10 reps each with assist to stabilize the dowel with the right hand, and mirroring the exercises in front of her.  Therapeutic Activities:   -facilitated UE bimanual hand tasks grasping jumbo pegs with the right hand, bringing it to midline with tactile and verbal cues, and transferring them to her left hand in preparation for placing them onto the jumbo pegboard.   Self-care:  -performed writing tasks formulating her first name only using a standard-medium thickness.    PATIENT EDUCATION: Education details:  OT services, POC, and goals Person educated: Patient and Parent Education method: Explanation, Demonstration, Tactile cues, and Verbal cues Education comprehension: verbalized understanding, returned demonstration, verbal cues required, and needs further education  HOME EXERCISE PROGRAM:   Continue in an ongoing basis, and provide as indicated.   GOALS: Goals reviewed with patient? Yes  SHORT TERM GOALS: Target date: 08/30/2023    Pt. Will require supervision HEPs for BUE strengthening  Baseline: Eval: No current HEP. Goal status: INITIAL   LONG TERM GOALS: Target date: 10/11/2023   Pt. Will increase BUE strength by 2 mm grades to assist with ADLs, and IADLs. Baseline:  Eval:  RUE: 3/5 overall, LUE: 3+/5 overall Goal status: INITIAL  2.  Pt. will improve bilateral grip strength by 5# to be able to securely hold items in  her hands. Baseline:  Eval: Grip Right: 10#, Left: 18# Goal status: INITIAL  3.  Pt. Will improve bilateral lateral pinch strength by 3# to assist with grooming tasks. Baseline: Eval: R: 4#, L: 7# Goal status: INITIAL  4.  Pt. Will improve bilateral hand FMC grasping, and placing one peg in the 9 hole peg test in preparation for improved manipulation of small objects. Baseline: Eval: Pt. is able to initiate formulating isolated 2nd digit extension in preparation for grasping small objects. Goal status: INITIAL  5.  Pt.  Will write her name her full name with 75% legibility. Baseline: Eval: 50% legible printing name only using 1 inch letters. Goal status: INITIAL  6. Pt.  Will demonstrate modified techniques for ADLs with minA.  Baseline: Eval: Does not currently use adaptive, or modified techniques during ADLS.    ASSESSMENT:  CLINICAL IMPRESSION:  Pt. required consistent cues, mirroring, and stabilization of the dowel with the right hand. Pt. requires verbal cues for redirection, and increased time for processing, and step-by-step cuing for motor planning through each part of the task. Pt. required assist to place the pen within her left hand. Pt. was unable to maintain a mature grasp on the pen. Pt. presented with difficulty formulating the letters of her name today resulting in the letters with increased size, and direction. Pt. continues to benefit from OT services to work on improving BUE strength, grip strength, pinch strength, and bilateral [facility] skills in order to be able to use, and manipulate objects for ADLs, and IADL tasks, improve writing tasks, and to review modifications  to assist with daily self-care tasks.   PERFORMANCE DEFICITS: in functional skills including ADLs, IADLs, coordination, dexterity, proprioception, ROM, strength, pain, Fine motor control, and UE functional use, cognitive skills including attention, memory, problem solving, and safety awareness, and  psychosocial skills including coping strategies, environmental adaptation, and routines and behaviors.   IMPAIRMENTS: are limiting patient from ADLs, IADLs, and leisure.   CO-MORBIDITIES: may have co-morbidities  that  affects occupational performance. Patient will benefit from skilled OT to address above impairments and improve overall function.  MODIFICATION OR ASSISTANCE TO COMPLETE EVALUATION: Min-Moderate modification of tasks or assist with assess necessary to complete an evaluation.  OT OCCUPATIONAL PROFILE AND HISTORY: Detailed assessment: Review of records and additional review of physical, cognitive, psychosocial history related to current functional performance.  CLINICAL DECISION MAKING: Moderate - several treatment options, min-mod task modification necessary  REHAB POTENTIAL: Good  EVALUATION COMPLEXITY: Moderate    PLAN:  OT FREQUENCY: 1x/week  OT DURATION: 12 weeks  PLANNED INTERVENTIONS: 97535 self care/ADL training, 96045 therapeutic exercise, 97530 therapeutic activity, 97112 neuromuscular re-education, 97140 manual therapy, 97018 paraffin, 40981 moist heat, 97760 Orthotic Initial, 97761 Prosthetic Initial, 97763 Orthotic/Prosthetic subsequent, functional mobility training, visual/perceptual remediation/compensation, energy conservation, patient/family education, and DME and/or AE instructions  RECOMMENDED OTHER SERVICES: PT/ST  CONSULTED AND AGREED WITH PLAN OF CARE: Patient  PLAN FOR NEXT SESSION: Treatment  Duey Ghent, MS, OTR/L  07/24/2023, 2:36 PM

## 2023-07-26 ENCOUNTER — Ambulatory Visit: Admitting: Physical Therapy

## 2023-07-28 ENCOUNTER — Other Ambulatory Visit: Payer: Self-pay | Admitting: Cardiology

## 2023-07-31 ENCOUNTER — Ambulatory Visit: Admitting: Physical Therapy

## 2023-08-02 ENCOUNTER — Ambulatory Visit

## 2023-08-02 ENCOUNTER — Ambulatory Visit: Admitting: Physical Therapy

## 2023-08-02 ENCOUNTER — Encounter: Payer: Self-pay | Admitting: *Deleted

## 2023-08-02 DIAGNOSIS — R2681 Unsteadiness on feet: Secondary | ICD-10-CM

## 2023-08-02 DIAGNOSIS — G931 Anoxic brain damage, not elsewhere classified: Secondary | ICD-10-CM

## 2023-08-02 DIAGNOSIS — R278 Other lack of coordination: Secondary | ICD-10-CM | POA: Diagnosis not present

## 2023-08-02 DIAGNOSIS — M6281 Muscle weakness (generalized): Secondary | ICD-10-CM

## 2023-08-02 DIAGNOSIS — R262 Difficulty in walking, not elsewhere classified: Secondary | ICD-10-CM

## 2023-08-02 DIAGNOSIS — R269 Unspecified abnormalities of gait and mobility: Secondary | ICD-10-CM

## 2023-08-02 DIAGNOSIS — R2689 Other abnormalities of gait and mobility: Secondary | ICD-10-CM

## 2023-08-02 NOTE — Therapy (Signed)
 OUTPATIENT OCCUPATIONAL THERAPY NEURO TREATMENT  Patient Name: Beverly [name] MRN: [id number] DOB:01/27/1986, 38 y.o., female Today's Date: 08/03/2023   REFERRING PROVIDER: Babs Hussar, MD  END OF SESSION:  OT End of Session - 08/03/23 2223     Visit Number 3    Number of Visits 12    Date for OT Re-Evaluation 10/11/23    OT Start Time 0930    OT Stop Time 1015    OT Time Calculation (min) 45 min    Activity Tolerance Patient tolerated treatment well    Behavior During Therapy Flat affect;Impulsive;WFL for tasks assessed/performed         Past Medical History:  Diagnosis Date   Brain injury [facility])    CAD (coronary artery disease)    HLD (hyperlipidemia)    Hypertension    last pregnancy   Ischemic cardiomyopathy    STEMI (ST elevation myocardial infarction) (HCC) 09/2019   SCAD with aneurysmal dilation of proximal LAD.   Vaginal Pap smear, abnormal    when she was 38yo   Ventricular fibrillation (HCC) 09/23/2019   Past Surgical History:  Procedure Laterality Date   CARDIAC CATHETERIZATION     IR GASTROSTOMY TUBE MOD SED  10/07/2019   IR GASTROSTOMY TUBE REMOVAL  07/02/2020   IR REPLC GASTRO/COLONIC TUBE PERCUT W/FLUORO  11/03/2021   IR REPLC GASTRO/COLONIC TUBE PERCUT W/FLUORO  03/09/2023   LEFT HEART CATH AND CORONARY ANGIOGRAPHY N/A 09/23/2019   Procedure: LEFT HEART CATH AND CORONARY ANGIOGRAPHY;  Surgeon: Mady [name], MD;  Location: [facility] INVASIVE CV LAB;  Service: Cardiovascular;  Laterality: N/A;   SUBQ ICD IMPLANT N/A 11/26/2020   Procedure: SUBQ ICD IMPLANT;  Surgeon: [name], MD;  Location: [facility] INVASIVE CV LAB;  Service: Cardiovascular;  Laterality: N/A;   Patient Active Problem List   Diagnosis Date Noted   Apraxia 10/05/2021   S/P ICD (internal cardiac defibrillator) procedure 11/27/2020   Hx of Cardiac arrest (HCC) 11/26/2020   Abnormality of gait 03/16/2020   Sleep disturbance 03/16/2020   Oropharyngeal dysphagia 02/12/2020    Coronary artery disease involving native coronary artery of native heart without angina pectoris 12/12/2019   Ischemic cardiomyopathy 12/12/2019   Brain injury (HCC)    Prediabetes    Anoxic brain injury (HCC) 11/05/2019   Dysphagia 11/05/2019   Physical deconditioning 11/05/2019   Acute delirium 11/05/2019   Respiratory failure (HCC)    Encounter for central line placement    Ventricular fibrillation (HCC) 09/23/2019   Acute combined systolic and diastolic heart failure (HCC) 09/23/2019   Ventricular tachycardia, polymorphic (HCC) 09/23/2019   Pelvic pain affecting pregnancy 10/15/2015   Supervision of normal pregnancy in third trimester 09/28/2015   Poor weight gain of pregnancy 09/28/2015   Iron  deficiency anemia of pregnancy 09/01/2015   Increased BMI (body mass index) 07/29/2015   ONSET DATE: 09/23/19  REFERRING DIAG: Anoxic Brain Injury  THERAPY DIAG:  Muscle weakness (generalized)  Other lack of coordination  Anoxic brain injury (HCC)  Rationale for Evaluation and Treatment: Rehabilitation  SUBJECTIVE:  SUBJECTIVE STATEMENT: Dad requested spit cup and wash cloth for pt, as he reports she tends to drool as she does not swallow her saliva. Pt accompanied by:  Father  PERTINENT HISTORY:  Pt. Is a 38 y.o. female with a history of severe aphasia  from a Cradiac Arrest leading to Anoxic Brain Injury on 09/23/19 at 2 weeks postpartum. Pt. Currently hs a G-Tube in place. PMHx includes: CAD, HLD, HTN, Ischemic cardiomyopathy, STEMI,  V-Fib, and ICD on 11/29/20.  PRECAUTIONS: None  WEIGHT BEARING RESTRICTIONS: No  PAIN:  Are you having pain? No  FALLS: Has patient fallen in last 6 months? No  LIVING ENVIRONMENT: Lives with: mother Lives in: House/apartment Stairs:  two story; No steps to enter. 10 steps inside, and has a chair lift Has following equipment at home: Walker - 2 wheeled and Wheelchair (manual), stair chair lift  PLOF: Independent  PATIENT GOALS: To  improve bilateral hand function for increased engagement in ADLs, and IADL tasks.  OBJECTIVE:  Note: Objective measures were completed at Evaluation unless otherwise noted.  HAND DOMINANCE: Left  ADLs: Per father report  Transfers/ambulation related to ADLs: Eating:  Dependent/feeding tube Grooming: Independent washing face, assist with hair care  UB Dressing: Assist with donning shirt LB Dressing: Assist with donning LE clothing  Toileting: Assist with transfers, toilet hygiene skills, and  clothing negotiation Bathing:  Unknown  IADLs: Per father report Shopping: Total A Light housekeeping: Total A Meal Prep: Total A Medication management:   Total A Financial management: Total A Handwriting: 50% legible printing name only using 1 inch letters.  MOBILITY STATUS: Needs Assist: using walker  POSTURE COMMENTS:   Sitting balance:  fair  ACTIVITY TOLERANCE: Activity tolerance:  Fair   FUNCTIONAL OUTCOME MEASURES:  TBD  UPPER EXTREMITY ROM:    Active ROM Right Eval WFL Left Eval [facility]  Shoulder flexion    Shoulder abduction    Shoulder adduction    Shoulder extension    Shoulder internal rotation    Shoulder external rotation    Elbow flexion    Elbow extension    Wrist flexion    Wrist extension    Wrist ulnar deviation    Wrist radial deviation    Wrist pronation    Wrist supination    (Blank rows = not tested)  UPPER EXTREMITY MMT:     MMT Right eval Left eval  Shoulder flexion 3/5 3+/5  Shoulder abduction 3/5 3+/5  Shoulder adduction    Shoulder extension    Shoulder internal rotation    Shoulder external rotation    Middle trapezius    Lower trapezius    Elbow flexion 3/5 3+/5  Elbow extension 3/5 3+/5  Wrist flexion    Wrist extension 3/5 3+/5  Wrist ulnar deviation    Wrist radial deviation    Wrist pronation    Wrist supination    (Blank rows = not tested)  HAND FUNCTION: Grip strength: Right: 10 lbs; Left: 18 lbs, Lateral pinch:  Right: 4 lbs, Left: 7 lbs.  COORDINATION:  Nine Hole Peg test: Pt. was able to formulate isolated 2nd digit extension in anticipation for grasping the horizontal pegs.  SENSATION: Light touch intact  EDEMA: N/A   COGNITION: Overall cognitive status: impaired   VISION:  Baseline:  wears glasses.   VISION ASSESSMENT: To be further assessed in functional context  PERCEPTION: WFL  PRAXIS: WFL  TREATMENT DATE: 08/02/2023 Therapeutic Exercise: -L grip strengthening: Pt worked with hand gripper to remove jumbo pegs from pegboard; hand gripper set at 6.6#, with pt requiring intermittent hand over hand assist for task sequencing; 1 trial completed  Therapeutic Activity: -Facilitated LUE GMC/FMC working to place jumbo pegs into pegboard in prep to set up hand strengthening activity noted above.  Pt progressed from grasping vertical pegs from OT 's hand, to horizontal pegs within container. -Facilitated BUE strengthening, GMC/FMC skills working to place [employer] 4 pieces into [employer].  Pt alternated reaching with R/L [employer].  1# wrist weight applied to increase reaching challenge. -Non-skid mat placed beneath chips on table top when working with L hand, and OT assisted with chip set up in hand when grasping with R hand.  PATIENT EDUCATION: Education details:  BUE strengthening/coordination training  Person educated: Patient and Parent Education method: Explanation, Demonstration, Tactile cues, and Verbal cues Education comprehension: verbalized understanding, returned demonstration, verbal cues required, and needs further education  HOME EXERCISE PROGRAM:  Continue in an ongoing basis, and provide as indicated.  GOALS: Goals reviewed with patient? Yes  SHORT TERM GOALS: Target date: 08/30/2023    Pt. Will require supervision HEPs for BUE  strengthening  Baseline: Eval: No current HEP. Goal status: INITIAL   LONG TERM GOALS: Target date: 10/11/2023   Pt. Will increase BUE strength by 2 mm grades to assist with ADLs, and IADLs. Baseline:  Eval:  RUE: 3/5 overall, LUE: 3+/5 overall Goal status: INITIAL  2.  Pt. will improve bilateral grip strength by 5# to be able to securely hold items in her hands. Baseline:  Eval: Grip Right: 10#, Left: 18# Goal status: INITIAL  3.  Pt. Will improve bilateral lateral pinch strength by 3# to assist with grooming tasks. Baseline: Eval: R: 4#, L: 7# Goal status: INITIAL  4.  Pt. Will improve bilateral hand FMC grasping, and placing one peg in the 9 hole peg test in preparation for improved manipulation of small objects. Baseline: Eval: Pt. is able to initiate formulating isolated 2nd digit extension in preparation for grasping small objects. Goal status: INITIAL  5.  Pt.  Will write her name her full name with 75% legibility. Baseline: Eval: 50% legible printing name only using 1 inch letters. Goal status: INITIAL  6. Pt.  Will demonstrate modified techniques for ADLs with minA.  Baseline: Eval: Does not currently use adaptive, or modified techniques during ADLS.    ASSESSMENT: CLINICAL IMPRESSION: Pt with good physical tolerance to 1# wrist weight in both R/LUEs during [employer] activity and use of hand gripper in L hand to remove jumbo pegs as noted above.  [name] attention to task diminished significantly in the last 10 min of session, requiring OT to block pt's view of passersby within clinic.  Will plan to reposition pt to face away from hallway traffic next session.  Pt. continues to benefit from OT services to work on improving BUE strength, grip strength, pinch strength, and bilateral [facility] skills in order to be able to use, and manipulate objects for ADLs, and IADL tasks, improve writing tasks, and to review modifications  to assist with daily self-care tasks.   PERFORMANCE  DEFICITS: in functional skills including ADLs, IADLs, coordination, dexterity, proprioception, ROM, strength, pain, Fine motor control, and UE functional use, cognitive skills including attention, memory, problem solving, and safety awareness, and psychosocial skills including coping strategies, environmental adaptation, and routines and behaviors.   IMPAIRMENTS: are limiting patient from  ADLs, IADLs, and leisure.   CO-MORBIDITIES: may have co-morbidities  that affects occupational performance. Patient will benefit from skilled OT to address above impairments and improve overall function.  MODIFICATION OR ASSISTANCE TO COMPLETE EVALUATION: Min-Moderate modification of tasks or assist with assess necessary to complete an evaluation.  OT OCCUPATIONAL PROFILE AND HISTORY: Detailed assessment: Review of records and additional review of physical, cognitive, psychosocial history related to current functional performance.  CLINICAL DECISION MAKING: Moderate - several treatment options, min-mod task modification necessary  REHAB POTENTIAL: Good  EVALUATION COMPLEXITY: Moderate    PLAN:  OT FREQUENCY: 1x/week  OT DURATION: 12 weeks  PLANNED INTERVENTIONS: 97535 self care/ADL training, 02889 therapeutic exercise, 97530 therapeutic activity, 97112 neuromuscular re-education, 97140 manual therapy, 97018 paraffin, 02989 moist heat, 97760 Orthotic Initial, 97761 Prosthetic Initial, 97763 Orthotic/Prosthetic subsequent, functional mobility training, visual/perceptual remediation/compensation, energy conservation, patient/family education, and DME and/or AE instructions  RECOMMENDED OTHER SERVICES: PT/ST  CONSULTED AND AGREED WITH PLAN OF CARE: Patient  PLAN FOR NEXT SESSION: Treatment  Inocente Blazing, MS, OTR/L   08/03/2023, 10:24 PM

## 2023-08-02 NOTE — Therapy (Signed)
 OUTPATIENT PHYSICAL THERAPY TREATMENT   Patient Name: Beverly [name] MRN: [id number] DOB:1986/02/07, 38 y.o., female Today's Date: 08/02/2023  PCP: [facility] REFERRING PROVIDER: Babs Arthea DASEN, MD  END OF SESSION:   PT End of Session - 08/02/23 1017     Visit Number 13    Number of Visits 25    Date for PT Re-Evaluation 08/22/23    Authorization Type UHC Dual Complete; Medicaid secondary    Authorization Time Period 05/30/23-08/22/23    Progress Note Due on Visit 10    PT Start Time 1017    PT Stop Time 1057    PT Time Calculation (min) 40 min    Equipment Utilized During Treatment Gait belt    Activity Tolerance Patient tolerated treatment well    Behavior During Therapy Flat affect;Impulsive;WFL for tasks assessed/performed            Past Medical History:  Diagnosis Date   Brain injury [facility])    CAD (coronary artery disease)    HLD (hyperlipidemia)    Hypertension    last pregnancy   Ischemic cardiomyopathy    STEMI (ST elevation myocardial infarction) (HCC) 09/2019   SCAD with aneurysmal dilation of proximal LAD.   Vaginal Pap smear, abnormal    when she was 38yo   Ventricular fibrillation (HCC) 09/23/2019   Past Surgical History:  Procedure Laterality Date   CARDIAC CATHETERIZATION     IR GASTROSTOMY TUBE MOD SED  10/07/2019   IR GASTROSTOMY TUBE REMOVAL  07/02/2020   IR REPLC GASTRO/COLONIC TUBE PERCUT W/FLUORO  11/03/2021   IR REPLC GASTRO/COLONIC TUBE PERCUT W/FLUORO  03/09/2023   LEFT HEART CATH AND CORONARY ANGIOGRAPHY N/A 09/23/2019   Procedure: LEFT HEART CATH AND CORONARY ANGIOGRAPHY;  Surgeon: Mady [name], MD;  Location: [facility] INVASIVE CV LAB;  Service: Cardiovascular;  Laterality: N/A;   SUBQ ICD IMPLANT N/A 11/26/2020   Procedure: SUBQ ICD IMPLANT;  Surgeon: [name], MD;  Location: [facility] INVASIVE CV LAB;  Service: Cardiovascular;  Laterality: N/A;   Patient Active Problem List   Diagnosis Date Noted    Apraxia 10/05/2021   S/P ICD (internal cardiac defibrillator) procedure 11/27/2020   Hx of Cardiac arrest (HCC) 11/26/2020   Abnormality of gait 03/16/2020   Sleep disturbance 03/16/2020   Oropharyngeal dysphagia 02/12/2020   Coronary artery disease involving native coronary artery of native heart without angina pectoris 12/12/2019   Ischemic cardiomyopathy 12/12/2019   Brain injury (HCC)    Prediabetes    Anoxic brain injury (HCC) 11/05/2019   Dysphagia 11/05/2019   Physical deconditioning 11/05/2019   Acute delirium 11/05/2019   Respiratory failure (HCC)    Encounter for central line placement    Ventricular fibrillation (HCC) 09/23/2019   Acute combined systolic and diastolic heart failure (HCC) 09/23/2019   Ventricular tachycardia, polymorphic (HCC) 09/23/2019   Pelvic pain affecting pregnancy 10/15/2015   Supervision of normal pregnancy in third trimester 09/28/2015   Poor weight gain of pregnancy 09/28/2015   Iron  deficiency anemia of pregnancy 09/01/2015   Increased BMI (body mass index) 07/29/2015    ONSET DATE: August 2021  REFERRING DIAG:  G93.1 (ICD-10-CM) - Anoxic brain injury (HCC)  R48.2 (ICD-10-CM) - Apraxia    THERAPY DIAG:   Muscle weakness (generalized)  Unsteadiness on feet  Difficulty in walking, not elsewhere classified  Other abnormalities of gait and mobility  Other lack of coordination  Anoxic brain injury (HCC)  Abnormality of gait and mobility  Rationale for Evaluation  and Treatment: Rehabilitation  SUBJECTIVE:                                                                                                                                                                                             SUBJECTIVE STATEMENT:   Pt arrives with father. No reported falls or stumbles since last PT session. No changes medically.   Pt accompanied by: self, father [name])  PERTINENT HISTORY:  Patient is a 38 year old with diagnois of anoxic brain  injury. Pt known to PT clinic. Previously seen for outpatient services March 2024. Pt was receiving HH PT until October 2024 per her mother's report. [name] reports pt using HHA for ambulation, was using RW but she says was told not to do to difficulty with safe technique. Pt has fallen 1x in the past six months trying to ambulate on her own. Pt requires assistance with ADLs, including bathing and dressing. [name] reports pt never indicates any pain. Pt with severe aphasia so is unable to answer questions/pt's mom provides hx. She reports pt has impaired short-term memory, difficulty with stair climbing (has stair lift at home). She reports pt's BLE are weaker than they used to be. The pt is probably limited to <10 min with ambulation due to poor endurance. Pt reports her goal is to be able to use an AD to achieve mod-I mobility rather than relying on someone's HHA to ambulate.She was receiving outpatient PT until Feb. 2022 last year but ran out of visits. Patient has past medical history of Anoxic Brain Injury 09/2019, CAD, HLD, HTN, ISCHEMIC CARDIMYOPATHY, STEMI, V-FIB, ICD on 11/29/2020. Patient is currently living with mom. Mother reports she is abel to walk some with a walker with supervision but primarily ambulates with HHA of mother who is her guardian. Mother also reports she requires assist for all transfers and ADL's.   PAIN:  Are you having pain? No  PRECAUTIONS: Fall  WEIGHT BEARING RESTRICTIONS: No  FALLS: Has patient fallen in last 6 months? Yes. Number of falls 1  LIVING ENVIRONMENT: Lives with: lives with their family, mother Lives in: House/apartment Stairs: has stairs but stair lift installed, pt using lift Has following equipment at home: Environmental consultant - 2 wheeled, Wheelchair (manual), shower chair, and Grab bars, pt uses stair lift  PLOF: Needs assistance with ADLs  PATIENT GOALS: balance, walking, and pt states basketball  OBJECTIVE:  Note: Objective measures were  completed at Evaluation unless otherwise noted.  DIAGNOSTIC FINDINGS:  None recent in chart  COGNITION: Overall cognitive status: impaired   SENSATION: Unable to complete testing due to impaired cognition, pt  unable to report if sensation impairment present, pt mother unsure   EDEMA:  Mom reports pt's feet do swell from time to time   LOWER EXTREMITY MMT:  *accuracy of testing likely impacted by pt difficulty following cues  MMT Right Eval Left Eval  Hip flexion 4+ 4+  Hip extension    Hip abduction 4+ 4+  Hip adduction 4+ 4+  Hip internal rotation    Hip external rotation    Knee flexion 4+ 4+  Knee extension 5 5  Ankle dorsiflexion    Ankle plantarflexion    Ankle inversion    Ankle eversion    (Blank rows = not tested)  TRANSFERS: HHA  tried in session, very unsteady, pt has difficulty with motor planning, little success with cues due to distraction, very unsafe Attempted in session with RW, pt unsteady, gait ataxic, continued significant difficulty with motor planning requiring up to min a + 2 and multi-modal cues to safely sit in chair.   FUNCTIONAL TESTS:  5 times sit to stand: 1 min 43 sec Timed up and go (TUG): 1 min 30 sec with RW 10 meter walk test: 0.25 m/s with RW extensive cuing to complete activity  PATIENT SURVEYS:  SIS-16 filled out by pt's mother, used as questions relevant to pt: 45                                                                                                                              TREATMENT DATE: 08/02/2023   Unless otherwise stated, at least light min A was provided and gait belt donned in order to ensure pt safety throughout session.   R stand pivot transport chair>standard green chair with armrests using RW with skilled min A for AD management and sequencing.   Gait with RW 2x 40ft with min assist and moderate cues for AD management and posture. Improved AD management and correction of RW veer to the R in second bout.    Forward/reverse gait with RW x 5-6 ft each to tap blaze pods x 10 (5:31 min) mod assist for sequencing and AD management as well as attention to task   Reach beyond midline then place basketball in hoop x 8 bil with min assist for balance and moderate cues for sequencing for initiation of grasp and full reach to basket.   Gait to transport chair with RW x 6ft and min assist for safety and min fading to mod instruction for safety in turn to sit on the side of transport chair.   PATIENT EDUCATION: Education details: goal reassessment, indications, exercise technique Person educated: Patient and Parent Education method: Explanation, Demonstration, Tactile cues, and Verbal cues Education comprehension: returned demonstration, verbal cues required, tactile cues required, and needs further education  HOME EXERCISE PROGRAM: Access Code: [email or web address] URL: [email or web address] Date: 06/11/2023 Prepared by: Massie Dollar  Exercises - Standing March with Counter Support  - 1 x daily - 7 x weekly - 2 sets -  8 reps - Side Stepping with Counter Support  - 1 x daily - 7 x weekly - 2 sets - 4 reps - Sit to Stand with Armchair  - 1 x daily - 7 x weekly - 2 sets - 5 reps - Seated March  - 1 x daily - 7 x weekly - 3 sets - 10 reps  GOALS: Goals reviewed with patient? Yes   SHORT TERM GOALS: Target date: 07/11/2023  Patient will be independent in home exercise program to improve strength/mobility for better functional independence with ADLs. Baseline: Goal status: INITIAL   LONG TERM GOALS: Target date: 08/22/2023   1.  Patient will complete five times sit to stand test in < 35 seconds indicating an increased LE strength and improved balance. Baseline: previously 37 sec with UUE 04/19/22 today 05/30/23: 1 min 43 seconds; 6/3: 41 sec with improved technique (use of BUE on armrests)  Goal status: PARTIALLY MET  2. Patient will demo ability to ambulate > 400 ft w/ LRAD versus hand held  assist min assist to demonstrate improved household mobility and short community distances.  Baseline: 4/28: 154ft with RW. Mod assist for AD management to prevent veer to the R. 6/3: ~ 300 ft with RW, requires cuing throughout for proximity to RW particularly with turns, but this has improved compared to eval Goal status: IN PROGRESS  3.  Patient will increase 10 meter walk test to >1.47m/s as to improve gait speed for better community ambulation and to reduce fall risk. Baseline: previously 0.36 m/s on 04/19/22 : 0.25 m/s;  07/10/23: 0. 24 m/s with RW  Goal status: INITIAL  4.  Patient will reduce timed up and go (TUG) to <50 seconds to reduce fall risk and demonstrate improved transfer/gait ability Baseline: previously 91 sec with RW on 04/19/22; 05/30/23: 1 min 30 sec with RW 07/19/2023: 48.265 seconds using RW with skilled min A primarily for AD management with pt having difficulty turning with AD Goal status: IN PROGRESS  5. Pt will exhibit correct and safe technique with stand>sit into a standard chair or transport chair to reduce risk of falling.   Baseline: pt current technique unsafe, poor motor planning; 6/3: pt with improved technique, bring RW closer to chair prior to sitting  Goal status: PARTIALLY MET   6. The pt will demonstrate at least a 15 point improvement on SIS-16 to indicate increased ease with ADLs.  Baseline: 45 (filled out by pt's mother); 42  Goal status: ONGOING  ASSESSMENT:  CLINICAL IMPRESSION:  Patient requires multimodal cueing for task orientation and spatial awareness. Noted to have improved sequencing and management of AD on second bout of gait training following forward/reverse gait to tap 2 blaze pods. Pt also able to engage BUE volitionally with reaching and grasping task to place ball in hoop with only min-mod cues for sequencing and min assist for balance. The Blaze Pod Random setting was chosen to enhance cognitive processing and agility, providing an  unpredictable environment to simulate real-world scenarios, and fostering quick reactions and adaptability.   Pt will continue to benefit from skilled therapy to address remaining deficits in order to improve overall QoL and return to PLOF.     OBJECTIVE IMPAIRMENTS: Abnormal gait, decreased activity tolerance, decreased balance, decreased coordination, decreased endurance, decreased knowledge of use of DME, decreased mobility, difficulty walking, decreased strength, decreased safety awareness, increased edema, impaired tone, improper body mechanics, and postural dysfunction.   ACTIVITY LIMITATIONS: carrying, lifting, bending, standing, squatting, stairs, transfers, bathing,  dressing, locomotion level, and caring for others  PARTICIPATION LIMITATIONS: meal prep, cleaning, laundry, medication management, personal finances, driving, shopping, community activity, occupation, and yard work  PERSONAL FACTORS: Sex, Time since onset of injury/illness/exacerbation, and 1-2 comorbidities: per chart PMH includes CAD, HTN, ischemic cardiomyopathy, STEMI, ventricular fibrillation, dysphagia, s/p ICD, apraxia  are also affecting patient's functional outcome.   REHAB POTENTIAL: Fair    CLINICAL DECISION MAKING: Evolving/moderate complexity  EVALUATION COMPLEXITY: High  PLAN:  PT FREQUENCY: 1-2x/week  PT DURATION: 12 weeks  PLANNED INTERVENTIONS: 97164- PT Re-evaluation, 97750- Physical Performance Testing, 97110-Therapeutic exercises, 97530- Therapeutic activity, [id number]- Neuromuscular re-education, 97535- Self Care, [id number]- Manual therapy, [phone]- Gait training, [phone]- Orthotic Initial, [phone]- Orthotic/Prosthetic subsequent, [phone]- Canalith repositioning, Patient/Family education, Balance training, Stair training, Taping, Joint mobilization, Spinal mobilization, Vestibular training, DME instructions, Wheelchair mobility training, Cryotherapy, and Moist heat  PLAN FOR NEXT SESSION:  - backwards gait  training with skilled assistance when available - dynamic stepping in variable directions - block practice stand pivot transfers? (Place 2 chairs at 39 degreess?) - address whether pt will use RW at home or not and continue use of this AD vs training balance/gait without it   Massie Dollar PT, DPT  Physical Therapist - [facility]  4:11 PM 08/02/23 '

## 2023-08-03 ENCOUNTER — Ambulatory Visit: Attending: Cardiology | Admitting: Cardiology

## 2023-08-03 ENCOUNTER — Ambulatory Visit: Payer: Self-pay | Admitting: Cardiology

## 2023-08-03 VITALS — BP 98/56 | HR 80 | Ht 69.0 in | Wt 196.0 lb

## 2023-08-03 DIAGNOSIS — Z95810 Presence of automatic (implantable) cardiac defibrillator: Secondary | ICD-10-CM | POA: Diagnosis not present

## 2023-08-03 DIAGNOSIS — I4901 Ventricular fibrillation: Secondary | ICD-10-CM

## 2023-08-03 LAB — CUP PACEART INCLINIC DEVICE CHECK
Date Time Interrogation Session: 20250627155626
Implantable Pulse Generator Implant Date: 20221021
Pulse Gen Serial Number: 161805

## 2023-08-03 NOTE — Progress Notes (Signed)
 Electrophysiology Clinic Note    Date:  08/03/2023  Patient ID:  Beverly [name], Beverly [name] 08/03/1985, MRN [id number] PCP:  [facility]  Cardiologist:  Lonni Hanson, MD Electrophysiologist: OLE ONEIDA HOLTS, MD   Discussed the use of AI scribe software for clinical note transcription with the patient, who gave verbal consent to proceed.   Patient Profile    Chief Complaint: routine S-ICD follow-up  History of Present Illness: Beverly [name] is a 38 y.o. female with PMH notable for VF cardiac arrest iso aneurysmal dilation and spontaneous coronary arter dissection, anoxic brain injury, s/p ICD, HFrEF; seen today for OLE ONEIDA HOLTS, MD for routine electrophysiology followup.  She last saw Dr. HOLTS 05/2022 at which time she was doing well without complaints related to her ICD.   On follow-up today, patient is doing well.  She is mostly nonverbal and so her mother provides most of the history.  Her mom states that she does not complain of chest pain, chest pressure, or shortness of breath.  Mother is concerned that the bedside monitor turns different colors and that that may indicate that the patient was shocked, but the patient does not endorse having been shocked.      Arrhythmia/Device History Bos Sci s-ICD, imp 11/2020; dx VF arrest      ROS:  Please see the history of present illness. All other systems are reviewed and otherwise negative.    Physical Exam    VS:  BP (!) 98/56 (BP Location: Left Arm, Patient Position: Sitting, Cuff Size: Normal)   Pulse 80   Ht 5' 9 (1.753 m)   Wt 196 lb (88.9 kg)   SpO2 97%   BMI 28.94 kg/m  BMI: Body mass index is 28.94 kg/m.      Wt Readings from Last 3 Encounters:  08/03/23 196 lb (88.9 kg)  06/05/23 192 lb (87.1 kg)  05/23/23 196 lb 9.6 oz (89.2 kg)     GEN- The patient is well appearing, alert Lungs- Clear to ausculation bilaterally, normal work of breathing.  Heart- Regular rate and  rhythm, no murmurs, rubs or gallops Extremities- No peripheral edema, warm, dry Skin-  device pocket well-healed, no tethering   Device interrogation done today and reviewed by myself:  Battery good Impedence stable No episodes No changes made today   Studies Reviewed   Previous EP, cardiology notes.    EKG is not ordered. Personal review of EKG from 06/05/2023 shows:  SR at 91bpm, low voltage         Limited TTE, 12/30/2019  1. Left ventricular ejection fraction, by estimation, is 50 to 55%. The left ventricle has low normal function. The left ventricle has no regional wall motion abnormalities. Left ventricular diastolic parameters are indeterminate.   2. The mitral valve is grossly normal. Trivial mitral valve regurgitation.   TTE, 09/24/2019  1. Septal and apical akinesis Distal anterior wall and mid/distal inferior wall hypokinesis Hyperdynamic basal function . Left ventricular ejection fraction, by estimation, is 30 to 35%. The left ventricle has moderately decreased function. The left ventricle demonstrates regional wall motion abnormalities (see scoring diagram/findings for description). The left ventricular internal cavity size was mildly dilated. Left ventricular diastolic parameters are indeterminate.   2. Right ventricular systolic function is normal. The right ventricular size is normal.   3. The mitral valve is normal in structure. Trivial mitral valve regurgitation. No evidence of mitral stenosis.   4. The aortic valve was not well  visualized. Aortic valve regurgitation is not visualized. No aortic stenosis is present.   5. The inferior vena cava is normal in size with greater than 50% respiratory variability, suggesting right atrial pressure of 3 mmHg.    Assessment and Plan     #) h/o VF arrest #) s-ICD in situ S-ICD functioning well No ventricular arrhythmias        Current medicines are reviewed at length with the patient today.   The patient does not  have concerns regarding her medicines.  The following changes were made today:  none  Labs/ tests ordered today include:  No orders of the defined types were placed in this encounter.    Disposition: Follow up with Dr. Cindie or EP APP in 12 months   Signed, Amos Gaber, NP  08/03/23  3:49 PM  Electrophysiology [facility]

## 2023-08-03 NOTE — Patient Instructions (Signed)
 Medication Instructions:  The current medical regimen is effective;  continue present plan and medications.  *If you need a refill on your cardiac medications before your next appointment, please call your pharmacy*  Follow-Up: At [facility], you and your health needs are our priority.  As part of our continuing mission to provide you with exceptional heart care, our providers are all part of one team.  This team includes your primary Cardiologist (physician) and Advanced Practice Providers or APPs (Physician Assistants and Nurse Practitioners) who all work together to provide you with the care you need, when you need it.  Your next appointment:   12 month(s)  Provider:   Ole Holts, MD or Suzann Riddle, NP    We recommend signing up for the patient portal called MyChart.  Sign up information is provided on this After Visit Summary.  MyChart is used to connect with patients for Virtual Visits (Telemedicine).  Patients are able to view lab/test results, encounter notes, upcoming appointments, etc.  Non-urgent messages can be sent to your provider as well.   To learn more about what you can do with MyChart, go to [email or web address].   \

## 2023-08-08 ENCOUNTER — Ambulatory Visit: Attending: Physical Medicine & Rehabilitation | Admitting: Physical Therapy

## 2023-08-08 ENCOUNTER — Ambulatory Visit

## 2023-08-08 DIAGNOSIS — R262 Difficulty in walking, not elsewhere classified: Secondary | ICD-10-CM | POA: Insufficient documentation

## 2023-08-08 DIAGNOSIS — R2689 Other abnormalities of gait and mobility: Secondary | ICD-10-CM | POA: Diagnosis present

## 2023-08-08 DIAGNOSIS — G931 Anoxic brain damage, not elsewhere classified: Secondary | ICD-10-CM

## 2023-08-08 DIAGNOSIS — M6281 Muscle weakness (generalized): Secondary | ICD-10-CM

## 2023-08-08 DIAGNOSIS — R2681 Unsteadiness on feet: Secondary | ICD-10-CM | POA: Insufficient documentation

## 2023-08-08 DIAGNOSIS — R278 Other lack of coordination: Secondary | ICD-10-CM

## 2023-08-08 DIAGNOSIS — R41841 Cognitive communication deficit: Secondary | ICD-10-CM | POA: Insufficient documentation

## 2023-08-08 DIAGNOSIS — R269 Unspecified abnormalities of gait and mobility: Secondary | ICD-10-CM | POA: Diagnosis present

## 2023-08-08 NOTE — Therapy (Addendum)
 OUTPATIENT OCCUPATIONAL THERAPY NEURO TREATMENT  Patient Name: Beverly [name] MRN: [id number] DOB:[birth year], 38 y.o., female Today's Date: 08/12/2023   REFERRING PROVIDER: Babs Hussar, MD  END OF SESSION:  OT End of Session - 08/12/23 1534     Visit Number 4    Number of Visits 12    Date for OT Re-Evaluation 10/11/23    OT Start Time 1015    OT Stop Time 1100    OT Time Calculation (min) 45 min    Activity Tolerance Patient tolerated treatment well    Behavior During Therapy Flat affect;WFL for tasks assessed/performed         Past Medical History:  Diagnosis Date   Brain injury (HCC)    CAD (coronary artery disease)    HLD (hyperlipidemia)    Hypertension    last pregnancy   Ischemic cardiomyopathy    STEMI (ST elevation myocardial infarction) (HCC) 09/2019   SCAD with aneurysmal dilation of proximal LAD.   Vaginal Pap smear, abnormal    when she was 38yo   Ventricular fibrillation (HCC) 09/23/2019   Past Surgical History:  Procedure Laterality Date   CARDIAC CATHETERIZATION     IR GASTROSTOMY TUBE MOD SED  10/07/2019   IR GASTROSTOMY TUBE REMOVAL  07/02/2020   IR REPLC GASTRO/COLONIC TUBE PERCUT W/FLUORO  11/03/2021   IR REPLC GASTRO/COLONIC TUBE PERCUT W/FLUORO  03/09/2023   LEFT HEART CATH AND CORONARY ANGIOGRAPHY N/A 09/23/2019   Procedure: LEFT HEART CATH AND CORONARY ANGIOGRAPHY;  Surgeon: Mady [name], MD;  Location: [facility] INVASIVE CV LAB;  Service: Cardiovascular;  Laterality: N/A;   SUBQ ICD IMPLANT N/A 11/26/2020   Procedure: SUBQ ICD IMPLANT;  Surgeon: [name], MD;  Location: [facility] INVASIVE CV LAB;  Service: Cardiovascular;  Laterality: N/A;   Patient Active Problem List   Diagnosis Date Noted   Apraxia 10/05/2021   S/P ICD (internal cardiac defibrillator) procedure 11/27/2020   Hx of Cardiac arrest (HCC) 11/26/2020   Abnormality of gait 03/16/2020   Sleep disturbance 03/16/2020   Oropharyngeal dysphagia 02/12/2020   Coronary artery  disease involving native coronary artery of native heart without angina pectoris 12/12/2019   Ischemic cardiomyopathy 12/12/2019   Brain injury (HCC)    Prediabetes    Anoxic brain injury (HCC) 11/05/2019   Dysphagia 11/05/2019   Physical deconditioning 11/05/2019   Acute delirium 11/05/2019   Respiratory failure (HCC)    Encounter for central line placement    Ventricular fibrillation (HCC) 09/23/2019   Acute combined systolic and diastolic heart failure (HCC) 09/23/2019   Ventricular tachycardia, polymorphic (HCC) 09/23/2019   Pelvic pain affecting pregnancy 10/15/2015   Supervision of normal pregnancy in third trimester 09/28/2015   Poor weight gain of pregnancy 09/28/2015   Iron  deficiency anemia of pregnancy 09/01/2015   Increased BMI (body mass index) 07/29/2015   ONSET DATE: 09/23/19  REFERRING DIAG: Anoxic Brain Injury  THERAPY DIAG:  Muscle weakness (generalized)  Other lack of coordination  Anoxic brain injury (HCC)  Rationale for Evaluation and Treatment: Rehabilitation  SUBJECTIVE:  SUBJECTIVE STATEMENT: Pt nodded head to acknowledge doing well today. Pt accompanied by:  Father  PERTINENT HISTORY:  Pt. Is a 38 y.o. female with a history of severe aphasia  from a [place] Arrest leading to Anoxic Brain Injury on 09/23/19 at 2 weeks postpartum. Pt. Currently hs a G-Tube in place. PMHx includes: CAD, HLD, HTN, Ischemic cardiomyopathy, STEMI, V-Fib, and ICD on 11/29/20.  PRECAUTIONS: None  WEIGHT BEARING RESTRICTIONS: No  PAIN:  Are you having pain? No  FALLS: Has patient fallen in last 6 months? No  LIVING ENVIRONMENT: Lives with: mother Lives in: House/apartment Stairs:  two story; No steps to enter. 10 steps inside, and has a chair lift Has following equipment at home: Walker - 2 wheeled and Wheelchair (manual), stair chair lift  PLOF: Independent  PATIENT GOALS: To improve bilateral hand function for increased engagement in ADLs, and IADL  tasks.  OBJECTIVE:  Note: Objective measures were completed at Evaluation unless otherwise noted.  HAND DOMINANCE: Left  ADLs: Per father report  Transfers/ambulation related to ADLs: Eating:  Dependent/feeding tube Grooming: Independent washing face, assist with hair care  UB Dressing: Assist with donning shirt LB Dressing: Assist with donning LE clothing  Toileting: Assist with transfers, toilet hygiene skills, and clothing negotiation Bathing:  Unknown  IADLs: Per father report Shopping: Total A Light housekeeping: Total A Meal Prep: Total A Medication management:   Total A Financial management: Total A Handwriting: 50% legible printing name only using 1 inch letters.  MOBILITY STATUS: Needs Assist: using walker  POSTURE COMMENTS:   Sitting balance:  fair  ACTIVITY TOLERANCE: Activity tolerance:  Fair   FUNCTIONAL OUTCOME MEASURES:  TBD  UPPER EXTREMITY ROM:    Active ROM Right Eval WFL Left Eval [facility]  Shoulder flexion    Shoulder abduction    Shoulder adduction    Shoulder extension    Shoulder internal rotation    Shoulder external rotation    Elbow flexion    Elbow extension    Wrist flexion    Wrist extension    Wrist ulnar deviation    Wrist radial deviation    Wrist pronation    Wrist supination    (Blank rows = not tested)  UPPER EXTREMITY MMT:     MMT Right eval Left eval  Shoulder flexion 3/5 3+/5  Shoulder abduction 3/5 3+/5  Shoulder adduction    Shoulder extension    Shoulder internal rotation    Shoulder external rotation    Middle trapezius    Lower trapezius    Elbow flexion 3/5 3+/5  Elbow extension 3/5 3+/5  Wrist flexion    Wrist extension 3/5 3+/5  Wrist ulnar deviation    Wrist radial deviation    Wrist pronation    Wrist supination    (Blank rows = not tested)  HAND FUNCTION: Grip strength: Right: 10 lbs; Left: 18 lbs, Lateral pinch: Right: 4 lbs, Left: 7 lbs.  COORDINATION:  Nine Hole Peg test: Pt. was  able to formulate isolated 2nd digit extension in anticipation for grasping the horizontal pegs.  SENSATION: Light touch intact  EDEMA: N/A   COGNITION: Overall cognitive status: impaired   VISION:  Baseline:  wears glasses.   VISION ASSESSMENT: To be further assessed in functional context  PERCEPTION: WFL  PRAXIS: WFL  TREATMENT DATE: 08/08/2023 Therapeutic Exercise: -L grip strengthening: Pt worked with hand gripper to remove jumbo pegs from pegboard; hand gripper set at 6.6#, with pt requiring intermittent hand over hand assist for task sequencing; 1 trial completed on each hand.  -Trial of 2# dowel for bilat chest press and shoulder press for 10 reps each; Pt required extensive multimodal cueing and ultimately transitioned activities as pt repeatedly removed hands from dowel and level of therapist assist required for completion negated strengthening goal.   Therapeutic Activity: -Facilitated LUE GMC/FMC working to place jumbo pegs into pegboard in prep to set up hand strengthening activity noted above.  Pt progressed from grasping vertical pegs from OT 's hand, to horizontal pegs within container.  -Facilitated BUE strengthening and GMC/FMC skills working to move [employer] between levels 1-4 of [employer].  1# wrist weight applied to each wrist to increase reaching challenge.  PATIENT EDUCATION: Education details:  BUE strengthening/coordination training  Person educated: Patient and Parent Education method: Explanation, Demonstration, Tactile cues, and Verbal cues Education comprehension: verbalized understanding, returned demonstration, verbal cues required, and needs further education  HOME EXERCISE PROGRAM:  Continue in an ongoing basis, and provide as indicated.  GOALS: Goals reviewed with patient? Yes  SHORT TERM GOALS: Target date:  08/30/2023    Pt. Will require supervision HEPs for BUE strengthening  Baseline: Eval: No current HEP. Goal status: INITIAL   LONG TERM GOALS: Target date: 10/11/2023   Pt. Will increase BUE strength by 2 mm grades to assist with ADLs, and IADLs. Baseline:  Eval:  RUE: 3/5 overall, LUE: 3+/5 overall Goal status: INITIAL  2.  Pt. will improve bilateral grip strength by 5# to be able to securely hold items in her hands. Baseline:  Eval: Grip Right: 10#, Left: 18# Goal status: INITIAL  3.  Pt. Will improve bilateral lateral pinch strength by 3# to assist with grooming tasks. Baseline: Eval: R: 4#, L: 7# Goal status: INITIAL  4.  Pt. Will improve bilateral hand FMC grasping, and placing one peg in the 9 hole peg test in preparation for improved manipulation of small objects. Baseline: Eval: Pt. is able to initiate formulating isolated 2nd digit extension in preparation for grasping small objects. Goal status: INITIAL  5.  Pt.  Will write her name her full name with 75% legibility. Baseline: Eval: 50% legible printing name only using 1 inch letters. Goal status: INITIAL  6. Pt.  Will demonstrate modified techniques for ADLs with minA.  Baseline: Eval: Does not currently use adaptive, or modified techniques during ADLS.    ASSESSMENT: CLINICAL IMPRESSION: Trial of 2# dowel for bilat chest press and shoulder press for 10 reps each; Pt required extensive multimodal cueing and ultimately transitioned activities as pt repeatedly removed hands from dowel and level of therapist assist required for completion negated strengthening goal of this exercise.  Pt required less hand over hand assist for jumbo peg/hand gripper tasks this date.  Pt with good physical tolerance to 1# wrist weight in both R/LUEs to complete [place] tower task.  [name] attention to task improved today with pt set up to face away from busy therapy gym walkways.  Pt. continues to benefit from OT services to work on improving BUE  strength, grip strength, pinch strength, and bilateral [facility] skills in order to be able to use, and manipulate objects for ADLs, and IADL tasks, improve writing tasks, and to review modifications  to assist with daily self-care tasks.   PERFORMANCE DEFICITS: in functional  skills including ADLs, IADLs, coordination, dexterity, proprioception, ROM, strength, pain, Fine motor control, and UE functional use, cognitive skills including attention, memory, problem solving, and safety awareness, and psychosocial skills including coping strategies, environmental adaptation, and routines and behaviors.   IMPAIRMENTS: are limiting patient from ADLs, IADLs, and leisure.   CO-MORBIDITIES: may have co-morbidities  that affects occupational performance. Patient will benefit from skilled OT to address above impairments and improve overall function.  MODIFICATION OR ASSISTANCE TO COMPLETE EVALUATION: Min-Moderate modification of tasks or assist with assess necessary to complete an evaluation.  OT OCCUPATIONAL PROFILE AND HISTORY: Detailed assessment: Review of records and additional review of physical, cognitive, psychosocial history related to current functional performance.  CLINICAL DECISION MAKING: Moderate - several treatment options, min-mod task modification necessary  REHAB POTENTIAL: Good  EVALUATION COMPLEXITY: Moderate    PLAN:  OT FREQUENCY: 1x/week  OT DURATION: 12 weeks  PLANNED INTERVENTIONS: 97535 self care/ADL training, 02889 therapeutic exercise, 97530 therapeutic activity, 97112 neuromuscular re-education, 97140 manual therapy, 97018 paraffin, 02989 moist heat, 97760 Orthotic Initial, 97761 Prosthetic Initial, 97763 Orthotic/Prosthetic subsequent, functional mobility training, visual/perceptual remediation/compensation, energy conservation, patient/family education, and DME and/or AE instructions  RECOMMENDED OTHER SERVICES: PT/ST  CONSULTED AND AGREED WITH PLAN OF CARE: Patient  PLAN  FOR NEXT SESSION: Treatment  Inocente Blazing, MS, OTR/L  08/12/2023, 3:37 PM

## 2023-08-08 NOTE — Therapy (Signed)
 OUTPATIENT PHYSICAL THERAPY TREATMENT   Patient Name: Beverly [name] MRN: [id number] DOB:1985/06/22, 38 y.o., female Today's Date: 08/08/2023  PCP: [facility] REFERRING PROVIDER: Babs Arthea DASEN, MD  END OF SESSION:   PT End of Session - 08/08/23 0958     Visit Number 14    Number of Visits 25    Date for PT Re-Evaluation 08/22/23    Authorization Type UHC Dual Complete; Medicaid secondary    Authorization Time Period 05/30/23-08/22/23    Progress Note Due on Visit 10    PT Start Time 1100    PT Stop Time 1140    PT Time Calculation (min) 40 min    Equipment Utilized During Treatment Gait belt    Activity Tolerance Patient tolerated treatment well    Behavior During Therapy Flat affect;Impulsive;WFL for tasks assessed/performed            Past Medical History:  Diagnosis Date   Brain injury [facility])    CAD (coronary artery disease)    HLD (hyperlipidemia)    Hypertension    last pregnancy   Ischemic cardiomyopathy    STEMI (ST elevation myocardial infarction) (HCC) 09/2019   SCAD with aneurysmal dilation of proximal LAD.   Vaginal Pap smear, abnormal    when she was 38yo   Ventricular fibrillation (HCC) 09/23/2019   Past Surgical History:  Procedure Laterality Date   CARDIAC CATHETERIZATION     IR GASTROSTOMY TUBE MOD SED  10/07/2019   IR GASTROSTOMY TUBE REMOVAL  07/02/2020   IR REPLC GASTRO/COLONIC TUBE PERCUT W/FLUORO  11/03/2021   IR REPLC GASTRO/COLONIC TUBE PERCUT W/FLUORO  03/09/2023   LEFT HEART CATH AND CORONARY ANGIOGRAPHY N/A 09/23/2019   Procedure: LEFT HEART CATH AND CORONARY ANGIOGRAPHY;  Surgeon: Mady [name], MD;  Location: [facility] INVASIVE CV LAB;  Service: Cardiovascular;  Laterality: N/A;   SUBQ ICD IMPLANT N/A 11/26/2020   Procedure: SUBQ ICD IMPLANT;  Surgeon: [name], MD;  Location: [facility] INVASIVE CV LAB;  Service: Cardiovascular;  Laterality: N/A;   Patient Active Problem List   Diagnosis Date Noted    Apraxia 10/05/2021   S/P ICD (internal cardiac defibrillator) procedure 11/27/2020   Hx of Cardiac arrest (HCC) 11/26/2020   Abnormality of gait 03/16/2020   Sleep disturbance 03/16/2020   Oropharyngeal dysphagia 02/12/2020   Coronary artery disease involving native coronary artery of native heart without angina pectoris 12/12/2019   Ischemic cardiomyopathy 12/12/2019   Brain injury (HCC)    Prediabetes    Anoxic brain injury (HCC) 11/05/2019   Dysphagia 11/05/2019   Physical deconditioning 11/05/2019   Acute delirium 11/05/2019   Respiratory failure (HCC)    Encounter for central line placement    Ventricular fibrillation (HCC) 09/23/2019   Acute combined systolic and diastolic heart failure (HCC) 09/23/2019   Ventricular tachycardia, polymorphic (HCC) 09/23/2019   Pelvic pain affecting pregnancy 10/15/2015   Supervision of normal pregnancy in third trimester 09/28/2015   Poor weight gain of pregnancy 09/28/2015   Iron  deficiency anemia of pregnancy 09/01/2015   Increased BMI (body mass index) 07/29/2015    ONSET DATE: August 2021  REFERRING DIAG:  G93.1 (ICD-10-CM) - Anoxic brain injury (HCC)  R48.2 (ICD-10-CM) - Apraxia    THERAPY DIAG:   Muscle weakness (generalized)  Other lack of coordination  Unsteadiness on feet  Difficulty in walking, not elsewhere classified  Other abnormalities of gait and mobility  Abnormality of gait and mobility  Rationale for Evaluation and Treatment: Rehabilitation  SUBJECTIVE:  SUBJECTIVE STATEMENT:   Pt arrives with father. No changes medically. No falls.   Pt accompanied by: self, father [name])  PERTINENT HISTORY:  Patient is a 38 year old with diagnois of anoxic brain injury. Pt known to PT clinic. Previously seen for outpatient services  March 2024. Pt was receiving HH PT until October 2024 per her mother's report. [name] reports pt using HHA for ambulation, was using RW but she says was told not to do to difficulty with safe technique. Pt has fallen 1x in the past six months trying to ambulate on her own. Pt requires assistance with ADLs, including bathing and dressing. [name] reports pt never indicates any pain. Pt with severe aphasia so is unable to answer questions/pt's mom provides hx. She reports pt has impaired short-term memory, difficulty with stair climbing (has stair lift at home). She reports pt's BLE are weaker than they used to be. The pt is probably limited to <10 min with ambulation due to poor endurance. Pt reports her goal is to be able to use an AD to achieve mod-I mobility rather than relying on someone's HHA to ambulate.She was receiving outpatient PT until Feb. 2022 last year but ran out of visits. Patient has past medical history of Anoxic Brain Injury 09/2019, CAD, HLD, HTN, ISCHEMIC CARDIMYOPATHY, STEMI, V-FIB, ICD on 11/29/2020. Patient is currently living with mom. Mother reports she is abel to walk some with a walker with supervision but primarily ambulates with HHA of mother who is her guardian. Mother also reports she requires assist for all transfers and ADL's.   PAIN:  Are you having pain? No  PRECAUTIONS: Fall  WEIGHT BEARING RESTRICTIONS: No  FALLS: Has patient fallen in last 6 months? Yes. Number of falls 1  LIVING ENVIRONMENT: Lives with: lives with their family, mother Lives in: House/apartment Stairs: has stairs but stair lift installed, pt using lift Has following equipment at home: Environmental consultant - 2 wheeled, Wheelchair (manual), shower chair, and Grab bars, pt uses stair lift  PLOF: Needs assistance with ADLs  PATIENT GOALS: balance, walking, and pt states basketball  OBJECTIVE:  Note: Objective measures were completed at Evaluation unless otherwise noted.  DIAGNOSTIC FINDINGS:   None recent in chart  COGNITION: Overall cognitive status: impaired   SENSATION: Unable to complete testing due to impaired cognition, pt unable to report if sensation impairment present, pt mother unsure   EDEMA:  Mom reports pt's feet do swell from time to time   LOWER EXTREMITY MMT:  *accuracy of testing likely impacted by pt difficulty following cues  MMT Right Eval Left Eval  Hip flexion 4+ 4+  Hip extension    Hip abduction 4+ 4+  Hip adduction 4+ 4+  Hip internal rotation    Hip external rotation    Knee flexion 4+ 4+  Knee extension 5 5  Ankle dorsiflexion    Ankle plantarflexion    Ankle inversion    Ankle eversion    (Blank rows = not tested)  TRANSFERS: HHA  tried in session, very unsteady, pt has difficulty with motor planning, little success with cues due to distraction, very unsafe Attempted in session with RW, pt unsteady, gait ataxic, continued significant difficulty with motor planning requiring up to min a + 2 and multi-modal cues to safely sit in chair.   FUNCTIONAL TESTS:  5 times sit to stand: 1 min 43 sec Timed up and go (TUG): 1 min 30 sec with RW 10 meter walk test: 0.25 m/s with  RW extensive cuing to complete activity  PATIENT SURVEYS:  SIS-16 filled out by pt's mother, used as questions relevant to pt: 45                                                                                                                              TREATMENT DATE: 08/08/2023   Unless otherwise stated, at least light min A was provided and gait belt donned in order to ensure pt safety throughout session.'   R stand pivot transport chair>standard green chair with armrests using RW with min A  and only min cues for AD management and sequencing.   Gait with RW 2x 128ft with min assist and min-moderate cues for AD management. Was able to get back in walker multiple times with only occasional cues from PT on this day. But was noted to have increased need to  stop/start due to increased distractions on this day. Improved AD management and correction of RW veer to the R without hitting nay obstacles on the R on  this day.   Side stepping at rail 35ft x 4 bil with mod cues for attention to task and improved awareness of BUE to keep hands at shoulder width.   Stair management with BUE support and min-mod cues for sequencing, step to gait pattern, with greatest difficulty with motor planning for final step.   Forward/reverse gait with RW 2 x 12ft each with min assist forward, mod assist and max-total verbal and physical instruction to sequence movements in reverse.   Gait to doorway of PT gym x 1ft with min assist, abel to avoid obstacles with only min assist from PT.  Transport chair placed under patient to sit at end of session. Left in chair with father at end of session.   PATIENT EDUCATION: Education details: goal reassessment, indications, exercise technique Person educated: Patient and Parent Education method: Explanation, Demonstration, Tactile cues, and Verbal cues Education comprehension: returned demonstration, verbal cues required, tactile cues required, and needs further education  HOME EXERCISE PROGRAM: Access Code: [email or web address] URL: [email or web address] Date: 06/11/2023 Prepared by: Massie Dollar  Exercises - Standing March with Counter Support  - 1 x daily - 7 x weekly - 2 sets - 8 reps - Side Stepping with Counter Support  - 1 x daily - 7 x weekly - 2 sets - 4 reps - Sit to Stand with Armchair  - 1 x daily - 7 x weekly - 2 sets - 5 reps - Seated March  - 1 x daily - 7 x weekly - 3 sets - 10 reps  GOALS: Goals reviewed with patient? Yes   SHORT TERM GOALS: Target date: 07/11/2023  Patient will be independent in home exercise program to improve strength/mobility for better functional independence with ADLs. Baseline: Goal status: INITIAL   LONG TERM GOALS: Target date: 08/22/2023   1.  Patient will complete  five times sit to stand test in < 35 seconds indicating an  increased LE strength and improved balance. Baseline: previously 37 sec with UUE 04/19/22 today 05/30/23: 1 min 43 seconds; 6/3: 41 sec with improved technique (use of BUE on armrests)  Goal status: PARTIALLY MET  2. Patient will demo ability to ambulate > 400 ft w/ LRAD versus hand held assist min assist to demonstrate improved household mobility and short community distances.  Baseline: 4/28: 137ft with RW. Mod assist for AD management to prevent veer to the R. 6/3: ~ 300 ft with RW, requires cuing throughout for proximity to RW particularly with turns, but this has improved compared to eval Goal status: IN PROGRESS  3.  Patient will increase 10 meter walk test to >1.93m/s as to improve gait speed for better community ambulation and to reduce fall risk. Baseline: previously 0.36 m/s on 04/19/22 : 0.25 m/s;  07/10/23: 0. 24 m/s with RW  Goal status: INITIAL  4.  Patient will reduce timed up and go (TUG) to <50 seconds to reduce fall risk and demonstrate improved transfer/gait ability Baseline: previously 91 sec with RW on 04/19/22; 05/30/23: 1 min 30 sec with RW 07/19/2023: 48.265 seconds using RW with skilled min A primarily for AD management with pt having difficulty turning with AD Goal status: IN PROGRESS  5. Pt will exhibit correct and safe technique with stand>sit into a standard chair or transport chair to reduce risk of falling.   Baseline: pt current technique unsafe, poor motor planning; 6/3: pt with improved technique, bring RW closer to chair prior to sitting  Goal status: PARTIALLY MET   6. The pt will demonstrate at least a 15 point improvement on SIS-16 to indicate increased ease with ADLs.  Baseline: 45 (filled out by pt's mother); 42  Goal status: ONGOING  ASSESSMENT:  CLINICAL IMPRESSION:  Patient requires multimodal cueing for task orientation and spatial awareness. But reduced cues in straight line on this day. Noted  to have improved sequencing and management of AD compared with prior session. Greatest difficulty with reverse gait due to extreme difficulty motor planning and attention to task. Only min cues for sequencing of stairs on this day with step to gait pattern. Was also able to navigate obstacles on this day with reduced assist or cues from PT with forward gait using RW.  Pt will continue to benefit from skilled therapy to address remaining deficits in order to improve overall QoL and return to PLOF.     OBJECTIVE IMPAIRMENTS: Abnormal gait, decreased activity tolerance, decreased balance, decreased coordination, decreased endurance, decreased knowledge of use of DME, decreased mobility, difficulty walking, decreased strength, decreased safety awareness, increased edema, impaired tone, improper body mechanics, and postural dysfunction.   ACTIVITY LIMITATIONS: carrying, lifting, bending, standing, squatting, stairs, transfers, bathing, dressing, locomotion level, and caring for others  PARTICIPATION LIMITATIONS: meal prep, cleaning, laundry, medication management, personal finances, driving, shopping, community activity, occupation, and yard work  PERSONAL FACTORS: Sex, Time since onset of injury/illness/exacerbation, and 1-2 comorbidities: per chart PMH includes CAD, HTN, ischemic cardiomyopathy, STEMI, ventricular fibrillation, dysphagia, s/p ICD, apraxia  are also affecting patient's functional outcome.   REHAB POTENTIAL: Fair    CLINICAL DECISION MAKING: Evolving/moderate complexity  EVALUATION COMPLEXITY: High  PLAN:  PT FREQUENCY: 1-2x/week  PT DURATION: 12 weeks  PLANNED INTERVENTIONS: 97164- PT Re-evaluation, 97750- Physical Performance Testing, 97110-Therapeutic exercises, 97530- Therapeutic activity, [id number]- Neuromuscular re-education, 97535- Self Care, [id number]- Manual therapy, [id number]- Gait training, [phone]- Orthotic Initial, [phone]- Orthotic/Prosthetic subsequent, [phone]- Canalith  repositioning, Patient/Family education, Balance training, Stair training, Taping, Joint  mobilization, Spinal mobilization, Vestibular training, DME instructions, Wheelchair mobility training, Cryotherapy, and Moist heat  PLAN FOR NEXT SESSION:   - backwards gait training with skilled assistance when available - dynamic stepping in variable directions Blocked practice transfers.   Massie Dollar PT, DPT  Physical Therapist - [facility]  9:59 AM 08/08/23 '

## 2023-08-13 ENCOUNTER — Ambulatory Visit

## 2023-08-15 ENCOUNTER — Ambulatory Visit

## 2023-08-15 DIAGNOSIS — G931 Anoxic brain damage, not elsewhere classified: Secondary | ICD-10-CM

## 2023-08-15 DIAGNOSIS — M6281 Muscle weakness (generalized): Secondary | ICD-10-CM

## 2023-08-15 DIAGNOSIS — R278 Other lack of coordination: Secondary | ICD-10-CM

## 2023-08-15 DIAGNOSIS — R262 Difficulty in walking, not elsewhere classified: Secondary | ICD-10-CM

## 2023-08-15 DIAGNOSIS — R2681 Unsteadiness on feet: Secondary | ICD-10-CM

## 2023-08-15 DIAGNOSIS — R2689 Other abnormalities of gait and mobility: Secondary | ICD-10-CM

## 2023-08-15 DIAGNOSIS — R269 Unspecified abnormalities of gait and mobility: Secondary | ICD-10-CM

## 2023-08-15 NOTE — Therapy (Signed)
 OUTPATIENT PHYSICAL THERAPY TREATMENT   Patient Name: Beverly [name] MRN: [id number] DOB:[birth year], 38 y.o., female Today's Date: 08/15/2023  PCP: [facility], [facility] REFERRING PROVIDER: Babs Arthea DASEN, MD  END OF SESSION:   PT End of Session - 08/15/23 1104     Visit Number 15    Number of Visits 25    Date for PT Re-Evaluation 08/22/23    Authorization Type UHC Dual Complete; Medicaid secondary    Authorization Time Period 05/30/23-08/22/23    Progress Note Due on Visit 10    PT Start Time 1104    PT Stop Time 1143    PT Time Calculation (min) 39 min    Equipment Utilized During Treatment Gait belt    Activity Tolerance Patient tolerated treatment well    Behavior During Therapy Flat affect;Impulsive;WFL for tasks assessed/performed            Past Medical History:  Diagnosis Date   Brain injury [facility])    CAD (coronary artery disease)    HLD (hyperlipidemia)    Hypertension    last pregnancy   Ischemic cardiomyopathy    STEMI (ST elevation myocardial infarction) (HCC) 09/2019   SCAD with aneurysmal dilation of proximal LAD.   Vaginal Pap smear, abnormal    when she was 38yo   Ventricular fibrillation (HCC) 09/23/2019   Past Surgical History:  Procedure Laterality Date   CARDIAC CATHETERIZATION     IR GASTROSTOMY TUBE MOD SED  10/07/2019   IR GASTROSTOMY TUBE REMOVAL  07/02/2020   IR REPLC GASTRO/COLONIC TUBE PERCUT W/FLUORO  11/03/2021   IR REPLC GASTRO/COLONIC TUBE PERCUT W/FLUORO  03/09/2023   LEFT HEART CATH AND CORONARY ANGIOGRAPHY N/A 09/23/2019   Procedure: LEFT HEART CATH AND CORONARY ANGIOGRAPHY;  Surgeon: Mady [name], MD;  Location: [facility] INVASIVE CV LAB;  Service: Cardiovascular;  Laterality: N/A;   SUBQ ICD IMPLANT N/A 11/26/2020   Procedure: SUBQ ICD IMPLANT;  Surgeon: [name], MD;  Location: [facility] INVASIVE CV LAB;  Service: Cardiovascular;  Laterality: N/A;   Patient Active Problem List   Diagnosis Date Noted    Apraxia 10/05/2021   S/P ICD (internal cardiac defibrillator) procedure 11/27/2020   Hx of Cardiac arrest (HCC) 11/26/2020   Abnormality of gait 03/16/2020   Sleep disturbance 03/16/2020   Oropharyngeal dysphagia 02/12/2020   Coronary artery disease involving native coronary artery of native heart without angina pectoris 12/12/2019   Ischemic cardiomyopathy 12/12/2019   Brain injury (HCC)    Prediabetes    Anoxic brain injury (HCC) 11/05/2019   Dysphagia 11/05/2019   Physical deconditioning 11/05/2019   Acute delirium 11/05/2019   Respiratory failure (HCC)    Encounter for central line placement    Ventricular fibrillation (HCC) 09/23/2019   Acute combined systolic and diastolic heart failure (HCC) 09/23/2019   Ventricular tachycardia, polymorphic (HCC) 09/23/2019   Pelvic pain affecting pregnancy 10/15/2015   Supervision of normal pregnancy in third trimester 09/28/2015   Poor weight gain of pregnancy 09/28/2015   Iron  deficiency anemia of pregnancy 09/01/2015   Increased BMI (body mass index) 07/29/2015    ONSET DATE: August 2021  REFERRING DIAG:  G93.1 (ICD-10-CM) - Anoxic brain injury (HCC)  R48.2 (ICD-10-CM) - Apraxia    THERAPY DIAG:   Muscle weakness (generalized)  Anoxic brain injury (HCC)  Other lack of coordination  Unsteadiness on feet  Difficulty in walking, not elsewhere classified  Other abnormalities of gait and mobility  Abnormality of gait and mobility  Rationale for Evaluation  and Treatment: Rehabilitation  SUBJECTIVE:                                                                                                                                                                                             SUBJECTIVE STATEMENT:   Patient mostly non-verbal- denied pain- Fist bumped PT upon arrival- father with her stating no new issues. States his biggest concern is still safety with transfers.  Pt accompanied by: self, father [name])  PERTINENT  HISTORY:  Patient is a 38 year old with diagnois of anoxic brain injury. Pt known to PT clinic. Previously seen for outpatient services March 2024. Pt was receiving HH PT until October 2024 per her mother's report. [name] reports pt using HHA for ambulation, was using RW but she says was told not to do to difficulty with safe technique. Pt has fallen 1x in the past six months trying to ambulate on her own. Pt requires assistance with ADLs, including bathing and dressing. [name] reports pt never indicates any pain. Pt with severe aphasia so is unable to answer questions/pt's mom provides hx. She reports pt has impaired short-term memory, difficulty with stair climbing (has stair lift at home). She reports pt's BLE are weaker than they used to be. The pt is probably limited to <10 min with ambulation due to poor endurance. Pt reports her goal is to be able to use an AD to achieve mod-I mobility rather than relying on someone's HHA to ambulate.She was receiving outpatient PT until Feb. 2022 last year but ran out of visits. Patient has past medical history of Anoxic Brain Injury 09/2019, CAD, HLD, HTN, ISCHEMIC CARDIMYOPATHY, STEMI, V-FIB, ICD on 11/29/2020. Patient is currently living with mom. Mother reports she is abel to walk some with a walker with supervision but primarily ambulates with HHA of mother who is her guardian. Mother also reports she requires assist for all transfers and ADL's.   PAIN:  Are you having pain? No  PRECAUTIONS: Fall  WEIGHT BEARING RESTRICTIONS: No  FALLS: Has patient fallen in last 6 months? Yes. Number of falls 1  LIVING ENVIRONMENT: Lives with: lives with their family, mother Lives in: House/apartment Stairs: has stairs but stair lift installed, pt using lift Has following equipment at home: Environmental consultant - 2 wheeled, Wheelchair (manual), shower chair, and Grab bars, pt uses stair lift  PLOF: Needs assistance with ADLs  PATIENT GOALS: balance, walking, and pt  states basketball  OBJECTIVE:  Note: Objective measures were completed at Evaluation unless otherwise noted.  DIAGNOSTIC FINDINGS:  None recent in chart  COGNITION: Overall cognitive status: impaired   SENSATION:  Unable to complete testing due to impaired cognition, pt unable to report if sensation impairment present, pt mother unsure   EDEMA:  Mom reports pt's feet do swell from time to time   LOWER EXTREMITY MMT:  *accuracy of testing likely impacted by pt difficulty following cues  MMT Right Eval Left Eval  Hip flexion 4+ 4+  Hip extension    Hip abduction 4+ 4+  Hip adduction 4+ 4+  Hip internal rotation    Hip external rotation    Knee flexion 4+ 4+  Knee extension 5 5  Ankle dorsiflexion    Ankle plantarflexion    Ankle inversion    Ankle eversion    (Blank rows = not tested)  TRANSFERS: HHA  tried in session, very unsteady, pt has difficulty with motor planning, little success with cues due to distraction, very unsafe Attempted in session with RW, pt unsteady, gait ataxic, continued significant difficulty with motor planning requiring up to min a + 2 and multi-modal cues to safely sit in chair.   FUNCTIONAL TESTS:  5 times sit to stand: 1 min 43 sec Timed up and go (TUG): 1 min 30 sec with RW 10 meter walk test: 0.25 m/s with RW extensive cuing to complete activity  PATIENT SURVEYS:  SIS-16 filled out by pt's mother, used as questions relevant to pt: 45                                                                                                                              TREATMENT DATE: 08/15/2023   Unless otherwise stated, at least light min A was provided and gait belt donned in order to ensure pt safety throughout session.'   R stand pivot transport chair>standard green chair with armrests using RW with min A  and only min cues for AD management and sequencing.   Gait with RW  x 250 ft with min assist and min-moderate cues for AD management due to  easily distracted. VC to keep head up and look out for any obstacles.    10 Meter Walk Test: Patient instructed to walk 10 meters (32.8 ft) as quickly and as safely as possible at their normal speed x2 and at a fast speed x2. Time measured from 2 meter mark to 8 meter mark to accommodate ramp-up and ramp-down.  Normal speed 1: .39 m/s Normal speed 2: .38 m/s Average Normal speed: .39 m/s Cut off scores: <0.4 m/s = household Ambulator, 0.4-0.8 m/s = limited community Ambulator, >0.8 m/s = community Ambulator, >1.2 m/s = crossing a street, <1.0 = increased fall risk MCID 0.05 m/s (small), 0.13 m/s (moderate), 0.06 m/s (significant)  (ANPTA Core Set of Outcome Measures for Adults with Neurologic Conditions, 2018)   Sit to stand transfers: focusing on safety and trying to work on which cues work the best X 10 reps - Repetitive VC and tactile cues for UE placement with standing and sitting    Forward/reverse  gait with RW x 47ft each with min assist forward, mod assist and max-total verbal and physical instruction to sequence movements in reverse- stopped after 6 feet backward as patient unable to sequence backing up walker and her body in unison.      PATIENT EDUCATION: Education details: goal reassessment, indications, exercise technique Person educated: Patient and Parent Education method: Explanation, Demonstration, Tactile cues, and Verbal cues Education comprehension: returned demonstration, verbal cues required, tactile cues required, and needs further education  HOME EXERCISE PROGRAM: Access Code: [email or web address] URL: [email or web address] Date: 06/11/2023 Prepared by: Massie Dollar  Exercises - Standing March with Counter Support  - 1 x daily - 7 x weekly - 2 sets - 8 reps - Side Stepping with Counter Support  - 1 x daily - 7 x weekly - 2 sets - 4 reps - Sit to Stand with Armchair  - 1 x daily - 7 x weekly - 2 sets - 5 reps - Seated March  - 1 x daily - 7 x weekly - 3  sets - 10 reps  GOALS: Goals reviewed with patient? Yes   SHORT TERM GOALS: Target date: 07/11/2023  Patient will be independent in home exercise program to improve strength/mobility for better functional independence with ADLs. Baseline: Goal status: INITIAL   LONG TERM GOALS: Target date: 08/22/2023   1.  Patient will complete five times sit to stand test in < 35 seconds indicating an increased LE strength and improved balance. Baseline: previously 37 sec with UUE 04/19/22 today 05/30/23: 1 min 43 seconds; 6/3: 41 sec with improved technique (use of BUE on armrests)  Goal status: PARTIALLY MET  2. Patient will demo ability to ambulate > 400 ft w/ LRAD versus hand held assist min assist to demonstrate improved household mobility and short community distances.  Baseline: 4/28: 146ft with RW. Mod assist for AD management to prevent veer to the R. 6/3: ~ 300 ft with RW, requires cuing throughout for proximity to RW particularly with turns, but this has improved compared to eval Goal status: IN PROGRESS  3.  Patient will increase 10 meter walk test to >1.35m/s as to improve gait speed for better community ambulation and to reduce fall risk. Baseline: previously 0.36 m/s on 04/19/22 : 0.25 m/s;  07/10/23: 0. 24 m/s with RW; 08/15/2023= 0.39 m/s  with RW yet max VC for RW navigation.  Goal status: INITIAL  4.  Patient will reduce timed up and go (TUG) to <50 seconds to reduce fall risk and demonstrate improved transfer/gait ability Baseline: previously 91 sec with RW on 04/19/22; 05/30/23: 1 min 30 sec with RW 07/19/2023: 48.265 seconds using RW with skilled min A primarily for AD management with pt having difficulty turning with AD Goal status: IN PROGRESS  5. Pt will exhibit correct and safe technique with stand>sit into a standard chair or transport chair to reduce risk of falling.   Baseline: pt current technique unsafe, poor motor planning; 6/3: pt with improved technique, bring RW closer to  chair prior to sitting  Goal status: PARTIALLY MET   6. The pt will demonstrate at least a 15 point improvement on SIS-16 to indicate increased ease with ADLs.  Baseline: 45 (filled out by pt's mother); 42  Goal status: ONGOING  ASSESSMENT:  CLINICAL IMPRESSION:  Patient continues to require multimodal cueing for task orientation and spatial awareness. She did perform better with walking speed overall today yet continues to be at high risk of falling due to her increased level  of distractibility. She continued to have difficulty with sequencing with safe sit to stand transfers - some improvement with practice and continued difficulty with retro walking with very high risk of falling due to extreme difficulty motor planning and attention to task.  Pt will continue to benefit from skilled therapy to address remaining deficits in order to improve overall QoL and return to PLOF.     OBJECTIVE IMPAIRMENTS: Abnormal gait, decreased activity tolerance, decreased balance, decreased coordination, decreased endurance, decreased knowledge of use of DME, decreased mobility, difficulty walking, decreased strength, decreased safety awareness, increased edema, impaired tone, improper body mechanics, and postural dysfunction.   ACTIVITY LIMITATIONS: carrying, lifting, bending, standing, squatting, stairs, transfers, bathing, dressing, locomotion level, and caring for others  PARTICIPATION LIMITATIONS: meal prep, cleaning, laundry, medication management, personal finances, driving, shopping, community activity, occupation, and yard work  PERSONAL FACTORS: Sex, Time since onset of injury/illness/exacerbation, and 1-2 comorbidities: per chart PMH includes CAD, HTN, ischemic cardiomyopathy, STEMI, ventricular fibrillation, dysphagia, s/p ICD, apraxia  are also affecting patient's functional outcome.   REHAB POTENTIAL: Fair    CLINICAL DECISION MAKING: Evolving/moderate complexity  EVALUATION COMPLEXITY:  High  PLAN:  PT FREQUENCY: 1-2x/week  PT DURATION: 12 weeks  PLANNED INTERVENTIONS: 97164- PT Re-evaluation, 97750- Physical Performance Testing, 97110-Therapeutic exercises, 97530- Therapeutic activity, [id number]- Neuromuscular re-education, 97535- Self Care, [id number]- Manual therapy, [phone]- Gait training, [phone]- Orthotic Initial, [phone]- Orthotic/Prosthetic subsequent, [phone]- Canalith repositioning, Patient/Family education, Balance training, Stair training, Taping, Joint mobilization, Spinal mobilization, Vestibular training, DME instructions, Wheelchair mobility training, Cryotherapy, and Moist heat  PLAN FOR NEXT SESSION:   - backwards gait training with skilled assistance when available - dynamic stepping in variable directions Blocked practice transfers.   Chyrl London, PT Physical Therapist - [facility]  12:11 PM 08/15/23 '

## 2023-08-15 NOTE — Therapy (Signed)
 OUTPATIENT OCCUPATIONAL THERAPY NEURO TREATMENT  Patient Name: Beverly [name] MRN: [id number] DOB:08/10/1985, 38 y.o., female Today's Date: 08/17/2023   REFERRING PROVIDER: Babs Hussar, MD  END OF SESSION:  OT End of Session - 08/17/23 1005     Visit Number 5    Number of Visits 12    Date for OT Re-Evaluation 10/11/23    OT Start Time 1145    OT Stop Time 1230    OT Time Calculation (min) 45 min    Activity Tolerance Patient tolerated treatment well    Behavior During Therapy Flat affect;Impulsive;WFL for tasks assessed/performed         Past Medical History:  Diagnosis Date   Brain injury [facility])    CAD (coronary artery disease)    HLD (hyperlipidemia)    Hypertension    last pregnancy   Ischemic cardiomyopathy    STEMI (ST elevation myocardial infarction) (HCC) 09/2019   SCAD with aneurysmal dilation of proximal LAD.   Vaginal Pap smear, abnormal    when she was 38yo   Ventricular fibrillation (HCC) 09/23/2019   Past Surgical History:  Procedure Laterality Date   CARDIAC CATHETERIZATION     IR GASTROSTOMY TUBE MOD SED  10/07/2019   IR GASTROSTOMY TUBE REMOVAL  07/02/2020   IR REPLC GASTRO/COLONIC TUBE PERCUT W/FLUORO  11/03/2021   IR REPLC GASTRO/COLONIC TUBE PERCUT W/FLUORO  03/09/2023   LEFT HEART CATH AND CORONARY ANGIOGRAPHY N/A 09/23/2019   Procedure: LEFT HEART CATH AND CORONARY ANGIOGRAPHY;  Surgeon: Mady [name], MD;  Location: [facility] INVASIVE CV LAB;  Service: Cardiovascular;  Laterality: N/A;   SUBQ ICD IMPLANT N/A 11/26/2020   Procedure: SUBQ ICD IMPLANT;  Surgeon: [name], MD;  Location: [facility] INVASIVE CV LAB;  Service: Cardiovascular;  Laterality: N/A;   Patient Active Problem List   Diagnosis Date Noted   Apraxia 10/05/2021   S/P ICD (internal cardiac defibrillator) procedure 11/27/2020   Hx of Cardiac arrest (HCC) 11/26/2020   Abnormality of gait 03/16/2020   Sleep disturbance 03/16/2020   Oropharyngeal dysphagia 02/12/2020    Coronary artery disease involving native coronary artery of native heart without angina pectoris 12/12/2019   Ischemic cardiomyopathy 12/12/2019   Brain injury (HCC)    Prediabetes    Anoxic brain injury (HCC) 11/05/2019   Dysphagia 11/05/2019   Physical deconditioning 11/05/2019   Acute delirium 11/05/2019   Respiratory failure (HCC)    Encounter for central line placement    Ventricular fibrillation (HCC) 09/23/2019   Acute combined systolic and diastolic heart failure (HCC) 09/23/2019   Ventricular tachycardia, polymorphic (HCC) 09/23/2019   Pelvic pain affecting pregnancy 10/15/2015   Supervision of normal pregnancy in third trimester 09/28/2015   Poor weight gain of pregnancy 09/28/2015   Iron  deficiency anemia of pregnancy 09/01/2015   Increased BMI (body mass index) 07/29/2015   ONSET DATE: 09/23/19  REFERRING DIAG: Anoxic Brain Injury  THERAPY DIAG:  Muscle weakness (generalized)  Anoxic brain injury (HCC)  Other lack of coordination  Rationale for Evaluation and Treatment: Rehabilitation  SUBJECTIVE:  SUBJECTIVE STATEMENT: Pt nodded to acknowledge no pain today. Pt accompanied by:  Father  PERTINENT HISTORY:  Pt. Is a 38 y.o. female with a history of severe aphasia  from a [place]rad[place] Arrest leading to Anoxic Brain Injury on 09/23/19 at 2 weeks postpartum. Pt. Currently hs a G-Tube in place. PMHx includes: CAD, HLD, HTN, Ischemic cardiomyopathy, STEMI, V-Fib, and ICD on 11/29/20.  PRECAUTIONS: None  WEIGHT BEARING RESTRICTIONS: No  PAIN:  Are you having pain? No  FALLS: Has patient fallen in last 6 months? No  LIVING ENVIRONMENT: Lives with: mother Lives in: House/apartment Stairs:  two story; No steps to enter. 10 steps inside, and has a chair lift Has following equipment at home: Walker - 2 wheeled and Wheelchair (manual), stair chair lift  PLOF: Independent  PATIENT GOALS: To improve bilateral hand function for increased engagement in ADLs, and IADL  tasks.  OBJECTIVE:  Note: Objective measures were completed at Evaluation unless otherwise noted.  HAND DOMINANCE: Left  ADLs: Per father report  Transfers/ambulation related to ADLs: Eating:  Dependent/feeding tube Grooming: Independent washing face, assist with hair care  UB Dressing: Assist with donning shirt LB Dressing: Assist with donning LE clothing  Toileting: Assist with transfers, toilet hygiene skills, and clothing negotiation Bathing:  Unknown  IADLs: Per father report Shopping: Total A Light housekeeping: Total A Meal Prep: Total A Medication management:   Total A Financial management: Total A Handwriting: 50% legible printing name only using 1 inch letters.  MOBILITY STATUS: Needs Assist: using walker  POSTURE COMMENTS:   Sitting balance:  fair  ACTIVITY TOLERANCE: Activity tolerance:  Fair   FUNCTIONAL OUTCOME MEASURES: TBD  UPPER EXTREMITY ROM:    Active ROM Right Eval WFL Left Eval [facility]  Shoulder flexion    Shoulder abduction    Shoulder adduction    Shoulder extension    Shoulder internal rotation    Shoulder external rotation    Elbow flexion    Elbow extension    Wrist flexion    Wrist extension    Wrist ulnar deviation    Wrist radial deviation    Wrist pronation    Wrist supination    (Blank rows = not tested)  UPPER EXTREMITY MMT:     MMT Right eval Left eval  Shoulder flexion 3/5 3+/5  Shoulder abduction 3/5 3+/5  Shoulder adduction    Shoulder extension    Shoulder internal rotation    Shoulder external rotation    Middle trapezius    Lower trapezius    Elbow flexion 3/5 3+/5  Elbow extension 3/5 3+/5  Wrist flexion    Wrist extension 3/5 3+/5  Wrist ulnar deviation    Wrist radial deviation    Wrist pronation    Wrist supination    (Blank rows = not tested)  HAND FUNCTION: Grip strength: Right: 10 lbs; Left: 18 lbs, Lateral pinch: Right: 4 lbs, Left: 7 lbs.  COORDINATION:  Nine Hole Peg test: Pt. was able  to formulate isolated 2nd digit extension in anticipation for grasping the horizontal pegs.  SENSATION: Light touch intact  EDEMA: N/A   COGNITION: Overall cognitive status: impaired   VISION:  Baseline:  wears glasses.   VISION ASSESSMENT: To be further assessed in functional context  PERCEPTION: WFL  PRAXIS: WFL                                                                                                       TREATMENT DATE: 08/15/2023 Therapeutic Exercise: -Red theraband for BUE strengthening:  Pt completed 1 set 10-15 reps of elbow flex/ext, chest press, and shoulder press.  OT provided multimodal cues and constant hand over hand assist to facilitate above noted exercises 1 arm at a time.  Therapeutic Activity: -Facilitated L/RUE GMC/FMC and pinch strengthening working to place and remove therapy resistant clothespins (yellow and red only) onto vertical dowels.  Multimodal cueing and repeat hand over hand assist to promote using 1 arm at a time.  PATIENT EDUCATION: Education details:  BUE strengthening/coordination training  Person educated: Patient and Parent Education method: Explanation, Demonstration, Tactile cues, and Verbal cues Education comprehension: verbalized understanding, returned demonstration, verbal cues required, and needs further education  HOME EXERCISE PROGRAM:  Continue in an ongoing basis, and provide as indicated.  GOALS: Goals reviewed with patient? Yes  SHORT TERM GOALS: Target date: 08/30/2023    Pt. Will require supervision HEPs for BUE strengthening  Baseline: Eval: No current HEP. Goal status: INITIAL   LONG TERM GOALS: Target date: 10/11/2023   Pt. Will increase BUE strength by 2 mm grades to assist with ADLs, and IADLs. Baseline:  Eval:  RUE: 3/5 overall, LUE: 3+/5 overall Goal status: INITIAL  2.  Pt. will improve bilateral grip strength by 5# to be able to securely hold items in her hands. Baseline:  Eval: Grip Right: 10#,  Left: 18# Goal status: INITIAL  3.  Pt. Will improve bilateral lateral pinch strength by 3# to assist with grooming tasks. Baseline: Eval: R: 4#, L: 7# Goal status: INITIAL  4.  Pt. Will improve bilateral hand FMC grasping, and placing one peg in the 9 hole peg test in preparation for improved manipulation of small objects. Baseline: Eval: Pt. is able to initiate formulating isolated 2nd digit extension in preparation for grasping small objects. Goal status: INITIAL  5.  Pt.  Will write her name her full name with 75% legibility. Baseline: Eval: 50% legible printing name only using 1 inch letters. Goal status: INITIAL  6. Pt.  Will demonstrate modified techniques for ADLs with minA.  Baseline: Eval: Does not currently use adaptive, or modified techniques during ADLS.    ASSESSMENT: CLINICAL IMPRESSION: Pt continues to require extensive multimodal cues to facilitate BUE therapeutic exercises and activities and to maintain attention to task.  D/t inconsistent ability to follow 1 step verbal commands, exercises/activities require increased time to complete.  OT inquired with pt's father who accompanies her to her sessions about who provides care for pt's ADLs in order to identify specific ADL components that caregiver would want OT to target.  [name] father indicated that he assumes pt's mother assists with all her care.  Will plan to reach out to mother to identify ADL components to target during OT sessions, and focus on functional object use for increasing ADL participation.  Pt. continues to benefit from OT services to work on improving BUE strength, grip strength, pinch strength, and bilateral [facility] skills in order to be able to use, and manipulate objects for ADLs, and IADL tasks, improve writing tasks, and to review modifications  to assist with daily self-care tasks.   PERFORMANCE DEFICITS: in functional skills including ADLs, IADLs, coordination, dexterity, proprioception, ROM, strength,  pain, Fine motor control, and UE functional use, cognitive skills including attention, memory, problem solving, and safety awareness, and psychosocial skills including coping strategies, environmental adaptation, and routines and behaviors.   IMPAIRMENTS: are limiting patient from ADLs, IADLs, and leisure.   CO-MORBIDITIES: may have co-morbidities  that affects occupational performance. Patient will benefit from  skilled OT to address above impairments and improve overall function.  MODIFICATION OR ASSISTANCE TO COMPLETE EVALUATION: Min-Moderate modification of tasks or assist with assess necessary to complete an evaluation.  OT OCCUPATIONAL PROFILE AND HISTORY: Detailed assessment: Review of records and additional review of physical, cognitive, psychosocial history related to current functional performance.  CLINICAL DECISION MAKING: Moderate - several treatment options, min-mod task modification necessary  REHAB POTENTIAL: Good  EVALUATION COMPLEXITY: Moderate    PLAN:  OT FREQUENCY: 1x/week  OT DURATION: 12 weeks  PLANNED INTERVENTIONS: 97535 self care/ADL training, 02889 therapeutic exercise, 97530 therapeutic activity, 97112 neuromuscular re-education, 97140 manual therapy, 97018 paraffin, 02989 moist heat, 97760 Orthotic Initial, 97761 Prosthetic Initial, 97763 Orthotic/Prosthetic subsequent, functional mobility training, visual/perceptual remediation/compensation, energy conservation, patient/family education, and DME and/or AE instructions  RECOMMENDED OTHER SERVICES: PT/ST  CONSULTED AND AGREED WITH PLAN OF CARE: Patient  PLAN FOR NEXT SESSION: Treatment  Inocente Blazing, MS, OTR/L  08/17/2023, 10:06 AM

## 2023-08-17 ENCOUNTER — Ambulatory Visit

## 2023-08-17 DIAGNOSIS — R278 Other lack of coordination: Secondary | ICD-10-CM

## 2023-08-17 DIAGNOSIS — R41841 Cognitive communication deficit: Secondary | ICD-10-CM

## 2023-08-17 DIAGNOSIS — R2689 Other abnormalities of gait and mobility: Secondary | ICD-10-CM

## 2023-08-17 DIAGNOSIS — R262 Difficulty in walking, not elsewhere classified: Secondary | ICD-10-CM

## 2023-08-17 DIAGNOSIS — M6281 Muscle weakness (generalized): Secondary | ICD-10-CM

## 2023-08-17 DIAGNOSIS — R2681 Unsteadiness on feet: Secondary | ICD-10-CM

## 2023-08-17 DIAGNOSIS — R269 Unspecified abnormalities of gait and mobility: Secondary | ICD-10-CM

## 2023-08-17 DIAGNOSIS — G931 Anoxic brain damage, not elsewhere classified: Secondary | ICD-10-CM

## 2023-08-17 NOTE — Therapy (Signed)
 OUTPATIENT PHYSICAL THERAPY TREATMENT   Patient Name: Beverly [name] MRN: [id number] DOB:[birth year], 38 y.o., female Today's Date: 08/17/2023  PCP: [facility] REFERRING PROVIDER: Babs Arthea DASEN, MD  END OF SESSION:   PT End of Session - 08/17/23 1013     Visit Number 16    Number of Visits 25    Date for PT Re-Evaluation 08/22/23    Authorization Type UHC Dual Complete; Medicaid secondary    Authorization Time Period 05/30/23-08/22/23    Progress Note Due on Visit 20    PT Start Time 1013    PT Stop Time 1058    PT Time Calculation (min) 45 min    Equipment Utilized During Treatment Gait belt    Activity Tolerance Patient tolerated treatment well    Behavior During Therapy Flat affect;Impulsive;WFL for tasks assessed/performed            Past Medical History:  Diagnosis Date   Brain injury [facility])    CAD (coronary artery disease)    HLD (hyperlipidemia)    Hypertension    last pregnancy   Ischemic cardiomyopathy    STEMI (ST elevation myocardial infarction) (HCC) 09/2019   SCAD with aneurysmal dilation of proximal LAD.   Vaginal Pap smear, abnormal    when she was 38yo   Ventricular fibrillation (HCC) 09/23/2019   Past Surgical History:  Procedure Laterality Date   CARDIAC CATHETERIZATION     IR GASTROSTOMY TUBE MOD SED  10/07/2019   IR GASTROSTOMY TUBE REMOVAL  07/02/2020   IR REPLC GASTRO/COLONIC TUBE PERCUT W/FLUORO  11/03/2021   IR REPLC GASTRO/COLONIC TUBE PERCUT W/FLUORO  03/09/2023   LEFT HEART CATH AND CORONARY ANGIOGRAPHY N/A 09/23/2019   Procedure: LEFT HEART CATH AND CORONARY ANGIOGRAPHY;  Surgeon: Mady [name], MD;  Location: [facility] INVASIVE CV LAB;  Service: Cardiovascular;  Laterality: N/A;   SUBQ ICD IMPLANT N/A 11/26/2020   Procedure: SUBQ ICD IMPLANT;  Surgeon: [name], MD;  Location: [facility] INVASIVE CV LAB;  Service: Cardiovascular;  Laterality: N/A;   Patient Active Problem List   Diagnosis Date Noted    Apraxia 10/05/2021   S/P ICD (internal cardiac defibrillator) procedure 11/27/2020   Hx of Cardiac arrest (HCC) 11/26/2020   Abnormality of gait 03/16/2020   Sleep disturbance 03/16/2020   Oropharyngeal dysphagia 02/12/2020   Coronary artery disease involving native coronary artery of native heart without angina pectoris 12/12/2019   Ischemic cardiomyopathy 12/12/2019   Brain injury (HCC)    Prediabetes    Anoxic brain injury (HCC) 11/05/2019   Dysphagia 11/05/2019   Physical deconditioning 11/05/2019   Acute delirium 11/05/2019   Respiratory failure (HCC)    Encounter for central line placement    Ventricular fibrillation (HCC) 09/23/2019   Acute combined systolic and diastolic heart failure (HCC) 09/23/2019   Ventricular tachycardia, polymorphic (HCC) 09/23/2019   Pelvic pain affecting pregnancy 10/15/2015   Supervision of normal pregnancy in third trimester 09/28/2015   Poor weight gain of pregnancy 09/28/2015   Iron  deficiency anemia of pregnancy 09/01/2015   Increased BMI (body mass index) 07/29/2015    ONSET DATE: August 2021  REFERRING DIAG:  G93.1 (ICD-10-CM) - Anoxic brain injury (HCC)  R48.2 (ICD-10-CM) - Apraxia    THERAPY DIAG:   Muscle weakness (generalized)  Anoxic brain injury (HCC)  Other lack of coordination  Unsteadiness on feet  Difficulty in walking, not elsewhere classified  Other abnormalities of gait and mobility  Abnormality of gait and mobility  Cognitive communication deficit  Rationale for Evaluation and Treatment: Rehabilitation  SUBJECTIVE:                                                                                                                                                                                             SUBJECTIVE STATEMENT:   Patient verbalized ok when asked how she was feeling. Mother present and states she can tell a difference at home with patient - less distracting and less overall physical assist with  transfers and some walking.   Pt accompanied by: self, mother- [name]  PERTINENT HISTORY:  Patient is a 38 year old with diagnois of anoxic brain injury. Pt known to PT clinic. Previously seen for outpatient services March 2024. Pt was receiving HH PT until October 2024 per her mother's report. [name] reports pt using HHA for ambulation, was using RW but she says was told not to do to difficulty with safe technique. Pt has fallen 1x in the past six months trying to ambulate on her own. Pt requires assistance with ADLs, including bathing and dressing. [name] reports pt never indicates any pain. Pt with severe aphasia so is unable to answer questions/pt's mom provides hx. She reports pt has impaired short-term memory, difficulty with stair climbing (has stair lift at home). She reports pt's BLE are weaker than they used to be. The pt is probably limited to <10 min with ambulation due to poor endurance. Pt reports her goal is to be able to use an AD to achieve mod-I mobility rather than relying on someone's HHA to ambulate.She was receiving outpatient PT until Feb. 2022 last year but ran out of visits. Patient has past medical history of Anoxic Brain Injury 09/2019, CAD, HLD, HTN, ISCHEMIC CARDIMYOPATHY, STEMI, V-FIB, ICD on 11/29/2020. Patient is currently living with mom. Mother reports she is abel to walk some with a walker with supervision but primarily ambulates with HHA of mother who is her guardian. Mother also reports she requires assist for all transfers and ADL's.   PAIN:  Are you having pain? No  PRECAUTIONS: Fall  WEIGHT BEARING RESTRICTIONS: No  FALLS: Has patient fallen in last 6 months? Yes. Number of falls 1  LIVING ENVIRONMENT: Lives with: lives with their family, mother Lives in: House/apartment Stairs: has stairs but stair lift installed, pt using lift Has following equipment at home: Environmental consultant - 2 wheeled, Wheelchair (manual), shower chair, and Grab bars, pt uses stair  lift  PLOF: Needs assistance with ADLs  PATIENT GOALS: balance, walking, and pt states basketball  OBJECTIVE:  Note: Objective measures were completed at Evaluation unless otherwise noted.  DIAGNOSTIC FINDINGS:  None recent in chart  COGNITION: Overall cognitive status: impaired   SENSATION: Unable to complete testing due to impaired cognition, pt unable to report if sensation impairment present, pt mother unsure   EDEMA:  Mom reports pt's feet do swell from time to time   LOWER EXTREMITY MMT:  *accuracy of testing likely impacted by pt difficulty following cues  MMT Right Eval Left Eval  Hip flexion 4+ 4+  Hip extension    Hip abduction 4+ 4+  Hip adduction 4+ 4+  Hip internal rotation    Hip external rotation    Knee flexion 4+ 4+  Knee extension 5 5  Ankle dorsiflexion    Ankle plantarflexion    Ankle inversion    Ankle eversion    (Blank rows = not tested)  TRANSFERS: HHA  tried in session, very unsteady, pt has difficulty with motor planning, little success with cues due to distraction, very unsafe Attempted in session with RW, pt unsteady, gait ataxic, continued significant difficulty with motor planning requiring up to min a + 2 and multi-modal cues to safely sit in chair.   FUNCTIONAL TESTS:  5 times sit to stand: 1 min 43 sec Timed up and go (TUG): 1 min 30 sec with RW 10 meter walk test: 0.25 m/s with RW extensive cuing to complete activity  PATIENT SURVEYS:  SIS-16 filled out by pt's mother, used as questions relevant to pt: 45                                                                                                                              TREATMENT DATE: 08/17/2023   Unless otherwise stated, at least light min A was provided and gait belt donned in order to ensure pt safety throughout session.'   R stand pivot transport chair>standard green chair with armrests using RW with min A  and only min cues for AD management and sequencing x  5.  Gait with RW  x 120 ft with intermittent min assist and min-moderate cues for AD management due to easily distracted and veering to Right. Mother present and followed with w/c.   Step tap at support bar x 10 alt LE with BUE Support (VC only for technique) patient able to complete without distraction  Step up at support bar using 6 step box- BUE Support x 12 reps each LE - min VC for sequencing.    Sit to stand transfers: Focusing on safety and trying to work on which cues work the best X 10 reps - Repetitive VC and tactile cues for UE placement with standing and sitting    Forward/reverse gait with RW x 41ft each with min assist forward, mod assist and max-total verbal and physical instruction to sequence movements in reverse      PATIENT EDUCATION: Education details: goal reassessment, indications, exercise technique Person educated: Patient and Parent Education method: Explanation, Demonstration, Tactile cues, and Verbal cues Education comprehension: returned demonstration, verbal  cues required, tactile cues required, and needs further education  HOME EXERCISE PROGRAM: Access Code: [email or web address] URL: [email or web address] Date: 06/11/2023 Prepared by: Massie Dollar  Exercises - Standing March with Counter Support  - 1 x daily - 7 x weekly - 2 sets - 8 reps - Side Stepping with Counter Support  - 1 x daily - 7 x weekly - 2 sets - 4 reps - Sit to Stand with Armchair  - 1 x daily - 7 x weekly - 2 sets - 5 reps - Seated March  - 1 x daily - 7 x weekly - 3 sets - 10 reps  GOALS: Goals reviewed with patient? Yes   SHORT TERM GOALS: Target date: 07/11/2023  Patient will be independent in home exercise program to improve strength/mobility for better functional independence with ADLs. Baseline: Goal status: INITIAL   LONG TERM GOALS: Target date: 08/22/2023   1.  Patient will complete five times sit to stand test in < 35 seconds indicating an increased LE strength  and improved balance. Baseline: previously 37 sec with UUE 04/19/22 today 05/30/23: 1 min 43 seconds; 6/3: 41 sec with improved technique (use of BUE on armrests)  Goal status: PARTIALLY MET  2. Patient will demo ability to ambulate > 400 ft w/ LRAD versus hand held assist min assist to demonstrate improved household mobility and short community distances.  Baseline: 4/28: 161ft with RW. Mod assist for AD management to prevent veer to the R. 6/3: ~ 300 ft with RW, requires cuing throughout for proximity to RW particularly with turns, but this has improved compared to eval Goal status: IN PROGRESS  3.  Patient will increase 10 meter walk test to >1.62m/s as to improve gait speed for better community ambulation and to reduce fall risk. Baseline: previously 0.36 m/s on 04/19/22 : 0.25 m/s;  07/10/23: 0. 24 m/s with RW; 08/15/2023= 0.39 m/s  with RW yet max VC for RW navigation.  Goal status: INITIAL  4.  Patient will reduce timed up and go (TUG) to <50 seconds to reduce fall risk and demonstrate improved transfer/gait ability Baseline: previously 91 sec with RW on 04/19/22; 05/30/23: 1 min 30 sec with RW 07/19/2023: 48.265 seconds using RW with skilled min A primarily for AD management with pt having difficulty turning with AD Goal status: IN PROGRESS  5. Pt will exhibit correct and safe technique with stand>sit into a standard chair or transport chair to reduce risk of falling.   Baseline: pt current technique unsafe, poor motor planning; 6/3: pt with improved technique, bring RW closer to chair prior to sitting  Goal status: PARTIALLY MET   6. The pt will demonstrate at least a 15 point improvement on SIS-16 to indicate increased ease with ADLs.  Baseline: 45 (filled out by pt's mother); 42  Goal status: ONGOING  ASSESSMENT:  CLINICAL IMPRESSION:  Treatment continued with safety with mobility training with mother present. Walking with RW was much improved overall today- with patient exhibiting less  overall distractibility and less overall physical assistance. Less overall VC for sit to stand and safer with retro walking.   Pt will continue to benefit from skilled therapy to address remaining deficits in order to improve overall QoL and return to PLOF.     OBJECTIVE IMPAIRMENTS: Abnormal gait, decreased activity tolerance, decreased balance, decreased coordination, decreased endurance, decreased knowledge of use of DME, decreased mobility, difficulty walking, decreased strength, decreased safety awareness, increased edema, impaired tone, improper body mechanics, and postural dysfunction.  ACTIVITY LIMITATIONS: carrying, lifting, bending, standing, squatting, stairs, transfers, bathing, dressing, locomotion level, and caring for others  PARTICIPATION LIMITATIONS: meal prep, cleaning, laundry, medication management, personal finances, driving, shopping, community activity, occupation, and yard work  PERSONAL FACTORS: Sex, Time since onset of injury/illness/exacerbation, and 1-2 comorbidities: per chart PMH includes CAD, HTN, ischemic cardiomyopathy, STEMI, ventricular fibrillation, dysphagia, s/p ICD, apraxia  are also affecting patient's functional outcome.   REHAB POTENTIAL: Fair    CLINICAL DECISION MAKING: Evolving/moderate complexity  EVALUATION COMPLEXITY: High  PLAN:  PT FREQUENCY: 1-2x/week  PT DURATION: 12 weeks  PLANNED INTERVENTIONS: 97164- PT Re-evaluation, 97750- Physical Performance Testing, 97110-Therapeutic exercises, 97530- Therapeutic activity, [id number]- Neuromuscular re-education, 97535- Self Care, [id number]- Manual therapy, [phone]- Gait training, [phone]- Orthotic Initial, [phone]- Orthotic/Prosthetic subsequent, [phone]- Canalith repositioning, Patient/Family education, Balance training, Stair training, Taping, Joint mobilization, Spinal mobilization, Vestibular training, DME instructions, Wheelchair mobility training, Cryotherapy, and Moist heat  PLAN FOR NEXT SESSION:   -  backwards gait training with skilled assistance when available - dynamic stepping in variable directions Blocked practice transfers.   Chyrl London, PT Physical Therapist - [facility]  11:18 AM 08/17/23 '

## 2023-08-21 ENCOUNTER — Ambulatory Visit

## 2023-08-21 DIAGNOSIS — R278 Other lack of coordination: Secondary | ICD-10-CM

## 2023-08-21 DIAGNOSIS — M6281 Muscle weakness (generalized): Secondary | ICD-10-CM | POA: Diagnosis not present

## 2023-08-21 DIAGNOSIS — R269 Unspecified abnormalities of gait and mobility: Secondary | ICD-10-CM

## 2023-08-21 DIAGNOSIS — R262 Difficulty in walking, not elsewhere classified: Secondary | ICD-10-CM

## 2023-08-21 DIAGNOSIS — R2681 Unsteadiness on feet: Secondary | ICD-10-CM

## 2023-08-21 DIAGNOSIS — G931 Anoxic brain damage, not elsewhere classified: Secondary | ICD-10-CM

## 2023-08-21 DIAGNOSIS — R2689 Other abnormalities of gait and mobility: Secondary | ICD-10-CM

## 2023-08-21 NOTE — Therapy (Signed)
 OUTPATIENT PHYSICAL THERAPY TREATMENT/RECERT   Patient Name: Beverly [name] MRN: [id number] DOB:1985-06-12, 38 y.o., female Today's Date: 08/21/2023  PCP: Center, [facility] REFERRING PROVIDER: Babs Arthea DASEN, MD  END OF SESSION:   PT End of Session - 08/21/23 1021     Visit Number 17    Number of Visits 41    Date for PT Re-Evaluation 11/13/23    Authorization Type UHC Dual Complete; Medicaid secondary    Authorization Time Period 05/30/23-08/22/23    Progress Note Due on Visit 20    PT Start Time 1017    PT Stop Time 1059    PT Time Calculation (min) 42 min    Equipment Utilized During Treatment Gait belt    Activity Tolerance Patient tolerated treatment well    Behavior During Therapy Flat affect;Impulsive;WFL for tasks assessed/performed            Past Medical History:  Diagnosis Date   Brain injury [facility])    CAD (coronary artery disease)    HLD (hyperlipidemia)    Hypertension    last pregnancy   Ischemic cardiomyopathy    STEMI (ST elevation myocardial infarction) (HCC) 09/2019   SCAD with aneurysmal dilation of proximal LAD.   Vaginal Pap smear, abnormal    when she was 38yo   Ventricular fibrillation (HCC) 09/23/2019   Past Surgical History:  Procedure Laterality Date   CARDIAC CATHETERIZATION     IR GASTROSTOMY TUBE MOD SED  10/07/2019   IR GASTROSTOMY TUBE REMOVAL  07/02/2020   IR REPLC GASTRO/COLONIC TUBE PERCUT W/FLUORO  11/03/2021   IR REPLC GASTRO/COLONIC TUBE PERCUT W/FLUORO  03/09/2023   LEFT HEART CATH AND CORONARY ANGIOGRAPHY N/A 09/23/2019   Procedure: LEFT HEART CATH AND CORONARY ANGIOGRAPHY;  Surgeon: Mady [name], MD;  Location: [facility] INVASIVE CV LAB;  Service: Cardiovascular;  Laterality: N/A;   SUBQ ICD IMPLANT N/A 11/26/2020   Procedure: SUBQ ICD IMPLANT;  Surgeon: [name], MD;  Location: [facility] INVASIVE CV LAB;  Service: Cardiovascular;  Laterality: N/A;   Patient Active Problem List   Diagnosis Date Noted    Apraxia 10/05/2021   S/P ICD (internal cardiac defibrillator) procedure 11/27/2020   Hx of Cardiac arrest (HCC) 11/26/2020   Abnormality of gait 03/16/2020   Sleep disturbance 03/16/2020   Oropharyngeal dysphagia 02/12/2020   Coronary artery disease involving native coronary artery of native heart without angina pectoris 12/12/2019   Ischemic cardiomyopathy 12/12/2019   Brain injury (HCC)    Prediabetes    Anoxic brain injury (HCC) 11/05/2019   Dysphagia 11/05/2019   Physical deconditioning 11/05/2019   Acute delirium 11/05/2019   Respiratory failure (HCC)    Encounter for central line placement    Ventricular fibrillation (HCC) 09/23/2019   Acute combined systolic and diastolic heart failure (HCC) 09/23/2019   Ventricular tachycardia, polymorphic (HCC) 09/23/2019   Pelvic pain affecting pregnancy 10/15/2015   Supervision of normal pregnancy in third trimester 09/28/2015   Poor weight gain of pregnancy 09/28/2015   Iron  deficiency anemia of pregnancy 09/01/2015   Increased BMI (body mass index) 07/29/2015    ONSET DATE: August 2021  REFERRING DIAG:  G93.1 (ICD-10-CM) - Anoxic brain injury (HCC)  R48.2 (ICD-10-CM) - Apraxia    THERAPY DIAG:   Muscle weakness (generalized) - Plan: PT plan of care cert/re-cert  Anoxic brain injury (HCC) - Plan: PT plan of care cert/re-cert  Other lack of coordination - Plan: PT plan of care cert/re-cert  Unsteadiness on feet - Plan: PT  plan of care cert/re-cert  Difficulty in walking, not elsewhere classified - Plan: PT plan of care cert/re-cert  Other abnormalities of gait and mobility - Plan: PT plan of care cert/re-cert  Abnormality of gait and mobility - Plan: PT plan of care cert/re-cert  Rationale for Evaluation and Treatment: Rehabilitation  SUBJECTIVE:                                                                                                                                                                                              SUBJECTIVE STATEMENT:   Mother reports the biggest issue is still just safety with walking. Stating she would like for Beverly [name] to be able to walk with walker if possible but unsure if she will be safe enough due to cognitive effects from her injury.    Pt accompanied by: self, mother- Beverly [name]  PERTINENT HISTORY:  Patient is a 38 year old with diagnois of anoxic brain injury. Pt known to PT clinic. Previously seen for outpatient services March 2024. Pt was receiving HH PT until October 2024 per her mother's report. [name] reports pt using HHA for ambulation, was using RW but she says was told not to do to difficulty with safe technique. Pt has fallen 1x in the past six months trying to ambulate on her own. Pt requires assistance with ADLs, including bathing and dressing. [name] reports pt never indicates any pain. Pt with severe aphasia so is unable to answer questions/pt's mom provides hx. She reports pt has impaired short-term memory, difficulty with stair climbing (has stair lift at home). She reports pt's BLE are weaker than they used to be. The pt is probably limited to <10 min with ambulation due to poor endurance. Pt reports her goal is to be able to use an AD to achieve mod-I mobility rather than relying on someone's HHA to ambulate.She was receiving outpatient PT until Feb. 2022 last year but ran out of visits. Patient has past medical history of Anoxic Brain Injury 09/2019, CAD, HLD, HTN, ISCHEMIC CARDIMYOPATHY, STEMI, V-FIB, ICD on 11/29/2020. Patient is currently living with mom. Mother reports she is abel to walk some with a walker with supervision but primarily ambulates with HHA of mother who is her guardian. Mother also reports she requires assist for all transfers and ADL's.   PAIN:  Are you having pain? No  PRECAUTIONS: Fall  WEIGHT BEARING RESTRICTIONS: No  FALLS: Has patient fallen in last 6 months? Yes. Number of falls 1  LIVING ENVIRONMENT: Lives with:  lives with their family, mother Lives in: House/apartment Stairs: has stairs but stair lift installed, pt using  lift Has following equipment at home: Beverly [name] - 2 wheeled, Wheelchair (manual), shower chair, and Grab bars, pt uses stair lift  PLOF: Needs assistance with ADLs  PATIENT GOALS: balance, walking, and pt states basketball  OBJECTIVE:  Note: Objective measures were completed at Evaluation unless otherwise noted.  DIAGNOSTIC FINDINGS:  None recent in chart  COGNITION: Overall cognitive status: impaired   SENSATION: Unable to complete testing due to impaired cognition, pt unable to report if sensation impairment present, pt mother unsure   EDEMA:  Mom reports pt's feet do swell from time to time   LOWER EXTREMITY MMT:  *accuracy of testing likely impacted by pt difficulty following cues  MMT Right Eval Left Eval  Hip flexion 4+ 4+  Hip extension    Hip abduction 4+ 4+  Hip adduction 4+ 4+  Hip internal rotation    Hip external rotation    Knee flexion 4+ 4+  Knee extension 5 5  Ankle dorsiflexion    Ankle plantarflexion    Ankle inversion    Ankle eversion    (Blank rows = not tested)  TRANSFERS: HHA  tried in session, very unsteady, pt has difficulty with motor planning, little success with cues due to distraction, very unsafe Attempted in session with RW, pt unsteady, gait ataxic, continued significant difficulty with motor planning requiring up to min a + 2 and multi-modal cues to safely sit in chair.   FUNCTIONAL TESTS:  5 times sit to stand: 1 min 43 sec Timed up and go (TUG): 1 min 30 sec with RW 10 meter walk test: 0.25 m/s with RW extensive cuing to complete activity  PATIENT SURVEYS:  SIS-16 filled out by pt's mother, used as questions relevant to pt: 45                                                                                                                              TREATMENT DATE: 08/21/2023   Unless otherwise stated, at least light  min A was provided and gait belt donned in order to ensure pt safety throughout session.'  Physical therapy treatment session today consisted of completing assessment of goals and administration of testing as demonstrated and documented in flow sheet, treatment, and goals section of this note. Addition treatments may be found below.   Pt performed 5 time sit<>stand (5xSTS): 38 with BUE Support and max VC  sec (>15 sec indicates increased fall risk)    PT instructed pt in TUG: 33 sec with HHA and 1 min 25 sec with RW (Max VC for safety with turning back to seat and to reach back safely)  (average of 3 trials; >13.5 sec indicates increased fall risk)   PATIENT EDUCATION: Education details: goal reassessment, indications, exercise technique Person educated: Patient and Parent Education method: Explanation, Demonstration, Tactile cues, and Verbal cues Education comprehension: returned demonstration, verbal cues required, tactile cues required, and needs further education  HOME EXERCISE PROGRAM: Access Code: [email or web address]  URL: [email or web address] Date: 06/11/2023 Prepared by: Massie Dollar  Exercises - Standing March with Counter Support  - 1 x daily - 7 x weekly - 2 sets - 8 reps - Side Stepping with Counter Support  - 1 x daily - 7 x weekly - 2 sets - 4 reps - Sit to Stand with Armchair  - 1 x daily - 7 x weekly - 2 sets - 5 reps - Seated March  - 1 x daily - 7 x weekly - 3 sets - 10 reps  GOALS: Goals reviewed with patient? Yes   SHORT TERM GOALS: Target date: 07/11/2023  Patient will be independent in home exercise program to improve strength/mobility for better functional independence with ADLs. Baseline: Goal status: INITIAL   LONG TERM GOALS: Target date: 11/13/2023   1.  Patient will complete five times sit to stand test in < 35 seconds indicating an increased LE strength and improved balance. Baseline: previously 37 sec with UUE 04/19/22 today 05/30/23: 1 min 43  seconds; 6/3: 41 sec with improved technique (use of BUE on armrests); 08/21/2023= 38 sec with some distractibility and VC for hand placement.  Goal status: PARTIALLY MET  2. Patient will demo ability to ambulate > 400 ft w/ LRAD versus hand held assist min assist to demonstrate improved household mobility and short community distances.  Baseline: 4/28: 124ft with RW. Mod assist for AD management to prevent veer to the R. 6/3: ~ 300 ft with RW, requires cuing throughout for proximity to RW particularly with turns, but this has improved compared to eval; 08/21/2023= Patient ambulated approx 300 feet with RW- Sill some min assist at times to navigate walker and VC to remain close to walker.  Goal status: IN PROGRESS  3.  Patient will increase 10 meter walk test to >1.44m/s as to improve gait speed for better community ambulation and to reduce fall risk. Baseline: previously 0.36 m/s on 04/19/22 : 0.25 m/s;  07/10/23: 0. 24 m/s with RW; 08/15/2023= 0.39 m/s  with RW yet max VC for RW navigation.  Goal status: PROGRESSING  4.  Patient will reduce timed up and go (TUG) to <50 seconds to reduce fall risk and demonstrate improved transfer/gait ability Baseline: previously 91 sec with RW on 04/19/22; 05/30/23: 1 min 30 sec with RW 07/19/2023: 48.265 seconds using RW with skilled min A primarily for AD management with pt having difficulty turning with AD; 08/21/2023- 33 sec with HHA and 1 min 25 sec with RW (Max VC for safety with turning back to seat and to reach back safely)  Goal status: IN PROGRESS  5. Pt will exhibit correct and safe technique with stand>sit into a standard chair or transport chair to reduce risk of falling.   Baseline: pt current technique unsafe, poor motor planning; 6/3: pt with improved technique, bring RW closer to chair prior to sitting; 08/21/2023- Patient able to perform with CGA yet inconsistent with cues for safety and consistency with hand placement.   Goal status: PARTIALLY MET   6.  The pt will demonstrate at least a 15 point improvement on SIS-16 to indicate increased ease with ADLs.  Baseline: 45 (filled out by pt's mother); 42; 08/21/2023= 46  Goal status: ONGOING  ASSESSMENT:  CLINICAL IMPRESSION:  Goals were reassessed for recert today and patient is making small functional gains- demonstrating some improvement with TUG, Gait speed, Sit to stand transfers and overall gait. At this time she is still most safe using HHA of caregiver but has  potential to stand and walk with less assist. Will continue to try RW for safety but if not will continue to reinforce use of HHA for all transfers and safety. Patient's condition has the potential to improve in response to therapy. Maximum improvement is yet to be obtained. The anticipated improvement is attainable and reasonable in a generally predictable time.   Pt will continue to benefit from skilled therapy to address remaining deficits in order to improve overall QoL and return to PLOF.     OBJECTIVE IMPAIRMENTS: Abnormal gait, decreased activity tolerance, decreased balance, decreased coordination, decreased endurance, decreased knowledge of use of DME, decreased mobility, difficulty walking, decreased strength, decreased safety awareness, increased edema, impaired tone, improper body mechanics, and postural dysfunction.   ACTIVITY LIMITATIONS: carrying, lifting, bending, standing, squatting, stairs, transfers, bathing, dressing, locomotion level, and caring for others  PARTICIPATION LIMITATIONS: meal prep, cleaning, laundry, medication management, personal finances, driving, shopping, community activity, occupation, and yard work  PERSONAL FACTORS: Sex, Time since onset of injury/illness/exacerbation, and 1-2 comorbidities: per chart PMH includes CAD, HTN, ischemic cardiomyopathy, STEMI, ventricular fibrillation, dysphagia, s/p ICD, apraxia  are also affecting patient's functional outcome.   REHAB POTENTIAL: Fair    CLINICAL  DECISION MAKING: Evolving/moderate complexity  EVALUATION COMPLEXITY: High  PLAN:  PT FREQUENCY: 1-2x/week  PT DURATION: 12 weeks  PLANNED INTERVENTIONS: 97164- PT Re-evaluation, 97750- Physical Performance Testing, 97110-Therapeutic exercises, 97530- Therapeutic activity, 97112- Neuromuscular re-education, 97535- Self Care, [id number]- Manual therapy, [phone]- Gait training, [phone]- Orthotic Initial, [phone]- Orthotic/Prosthetic subsequent, [phone]- Canalith repositioning, Patient/Family education, Balance training, Stair training, Taping, Joint mobilization, Spinal mobilization, Vestibular training, DME instructions, Wheelchair mobility training, Cryotherapy, and Moist heat  PLAN FOR NEXT SESSION:   - backwards gait training with skilled assistance when available - dynamic stepping in variable directions Transfer safety training Gait with least restrictive AD vs. HHA  Chyrl London, PT Physical Therapist - [facility]  5:51 PM 08/21/23 '

## 2023-08-23 ENCOUNTER — Ambulatory Visit

## 2023-08-23 ENCOUNTER — Ambulatory Visit: Admitting: Occupational Therapy

## 2023-08-23 DIAGNOSIS — R278 Other lack of coordination: Secondary | ICD-10-CM

## 2023-08-23 DIAGNOSIS — R262 Difficulty in walking, not elsewhere classified: Secondary | ICD-10-CM

## 2023-08-23 DIAGNOSIS — R2689 Other abnormalities of gait and mobility: Secondary | ICD-10-CM

## 2023-08-23 DIAGNOSIS — M6281 Muscle weakness (generalized): Secondary | ICD-10-CM

## 2023-08-23 DIAGNOSIS — R2681 Unsteadiness on feet: Secondary | ICD-10-CM

## 2023-08-23 DIAGNOSIS — R269 Unspecified abnormalities of gait and mobility: Secondary | ICD-10-CM

## 2023-08-23 DIAGNOSIS — R41841 Cognitive communication deficit: Secondary | ICD-10-CM

## 2023-08-23 DIAGNOSIS — G931 Anoxic brain damage, not elsewhere classified: Secondary | ICD-10-CM

## 2023-08-23 NOTE — Therapy (Addendum)
 OUTPATIENT PHYSICAL THERAPY TREATMENT   Patient Name: ALYZZA ANDRINGA MRN: [id number] DOB:Feb 12, 1985, 38 y.o., female Today's Date: 08/23/2023  PCP: [facility], [facility] REFERRING PROVIDER: Babs Arthea DASEN, MD  END OF SESSION:   PT End of Session - 08/23/23 1532     Visit Number 18    Number of Visits 41    Date for PT Re-Evaluation 11/13/23    Authorization Type UHC Dual Complete; Medicaid secondary    Authorization Time Period 05/30/23-08/22/23    Progress Note Due on Visit 20    PT Start Time 1535    PT Stop Time 1615    PT Time Calculation (min) 40 min    Equipment Utilized During Treatment Gait belt    Activity Tolerance Patient tolerated treatment well    Behavior During Therapy Flat affect;Impulsive;WFL for tasks assessed/performed          Past Medical History:  Diagnosis Date   Brain injury [facility])    CAD (coronary artery disease)    HLD (hyperlipidemia)    Hypertension    last pregnancy   Ischemic cardiomyopathy    STEMI (ST elevation myocardial infarction) (HCC) 09/2019   SCAD with aneurysmal dilation of proximal LAD.   Vaginal Pap smear, abnormal    when she was 38yo   Ventricular fibrillation (HCC) 09/23/2019   Past Surgical History:  Procedure Laterality Date   CARDIAC CATHETERIZATION     IR GASTROSTOMY TUBE MOD SED  10/07/2019   IR GASTROSTOMY TUBE REMOVAL  07/02/2020   IR REPLC GASTRO/COLONIC TUBE PERCUT W/FLUORO  11/03/2021   IR REPLC GASTRO/COLONIC TUBE PERCUT W/FLUORO  03/09/2023   LEFT HEART CATH AND CORONARY ANGIOGRAPHY N/A 09/23/2019   Procedure: LEFT HEART CATH AND CORONARY ANGIOGRAPHY;  Surgeon: Mady [name], MD;  Location: [facility] INVASIVE CV LAB;  Service: Cardiovascular;  Laterality: N/A;   SUBQ ICD IMPLANT N/A 11/26/2020   Procedure: SUBQ ICD IMPLANT;  Surgeon: [name], MD;  Location: [facility] INVASIVE CV LAB;  Service: Cardiovascular;  Laterality: N/A;   Patient Active Problem List   Diagnosis Date Noted   Apraxia  10/05/2021   S/P ICD (internal cardiac defibrillator) procedure 11/27/2020   Hx of Cardiac arrest (HCC) 11/26/2020   Abnormality of gait 03/16/2020   Sleep disturbance 03/16/2020   Oropharyngeal dysphagia 02/12/2020   Coronary artery disease involving native coronary artery of native heart without angina pectoris 12/12/2019   Ischemic cardiomyopathy 12/12/2019   Brain injury (HCC)    Prediabetes    Anoxic brain injury (HCC) 11/05/2019   Dysphagia 11/05/2019   Physical deconditioning 11/05/2019   Acute delirium 11/05/2019   Respiratory failure (HCC)    Encounter for central line placement    Ventricular fibrillation (HCC) 09/23/2019   Acute combined systolic and diastolic heart failure (HCC) 09/23/2019   Ventricular tachycardia, polymorphic (HCC) 09/23/2019   Pelvic pain affecting pregnancy 10/15/2015   Supervision of normal pregnancy in third trimester 09/28/2015   Poor weight gain of pregnancy 09/28/2015   Iron  deficiency anemia of pregnancy 09/01/2015   Increased BMI (body mass index) 07/29/2015    ONSET DATE: August 2021  REFERRING DIAG:  G93.1 (ICD-10-CM) - Anoxic brain injury (HCC)  R48.2 (ICD-10-CM) - Apraxia    THERAPY DIAG:   Muscle weakness (generalized)  Anoxic brain injury (HCC)  Other lack of coordination  Unsteadiness on feet  Difficulty in walking, not elsewhere classified  Other abnormalities of gait and mobility  Abnormality of gait and mobility  Cognitive communication deficit  Rationale  for Evaluation and Treatment: Rehabilitation  SUBJECTIVE:                                                                                                                                                                                             SUBJECTIVE STATEMENT:   Pt reports she has not being doing any much.  Pt reports doing well otherwise and has been doing HEP.  Pt accompanied by: self, mother- [name]  PERTINENT HISTORY:   Patient is a 38 year  old with diagnois of anoxic brain injury. Pt known to PT clinic. Previously seen for outpatient services March 2024. Pt was receiving HH PT until October 2024 per her mother's report. [name] reports pt using HHA for ambulation, was using RW but she says was told not to do to difficulty with safe technique. Pt has fallen 1x in the past six months trying to ambulate on her own. Pt requires assistance with ADLs, including bathing and dressing. [name] reports pt never indicates any pain. Pt with severe aphasia so is unable to answer questions/pt's mom provides hx. She reports pt has impaired short-term memory, difficulty with stair climbing (has stair lift at home). She reports pt's BLE are weaker than they used to be. The pt is probably limited to <10 min with ambulation due to poor endurance. Pt reports her goal is to be able to use an AD to achieve mod-I mobility rather than relying on someone's HHA to ambulate.She was receiving outpatient PT until Feb. 2022 last year but ran out of visits. Patient has past medical history of Anoxic Brain Injury 09/2019, CAD, HLD, HTN, ISCHEMIC CARDIMYOPATHY, STEMI, V-FIB, ICD on 11/29/2020. Patient is currently living with mom. Mother reports she is abel to walk some with a walker with supervision but primarily ambulates with HHA of mother who is her guardian. Mother also reports she requires assist for all transfers and ADL's.   PAIN:  Are you having pain? No  PRECAUTIONS: Fall  WEIGHT BEARING RESTRICTIONS: No  FALLS: Has patient fallen in last 6 months? Yes. Number of falls 1  LIVING ENVIRONMENT: Lives with: lives with their family, mother Lives in: House/apartment Stairs: has stairs but stair lift installed, pt using lift Has following equipment at home: Environmental consultant - 2 wheeled, Wheelchair (manual), shower chair, and Grab bars, pt uses stair lift  PLOF: Needs assistance with ADLs  PATIENT GOALS: balance, walking, and pt states basketball  OBJECTIVE:   Note: Objective measures were completed at Evaluation unless otherwise noted.  DIAGNOSTIC FINDINGS:  None recent in chart  COGNITION: Overall cognitive status: impaired   SENSATION: Unable to complete  testing due to impaired cognition, pt unable to report if sensation impairment present, pt mother unsure   EDEMA:  Mom reports pt's feet do swell from time to time   LOWER EXTREMITY MMT:  *accuracy of testing likely impacted by pt difficulty following cues  MMT Right Eval Left Eval  Hip flexion 4+ 4+  Hip extension    Hip abduction 4+ 4+  Hip adduction 4+ 4+  Hip internal rotation    Hip external rotation    Knee flexion 4+ 4+  Knee extension 5 5  Ankle dorsiflexion    Ankle plantarflexion    Ankle inversion    Ankle eversion    (Blank rows = not tested)  TRANSFERS: HHA  tried in session, very unsteady, pt has difficulty with motor planning, little success with cues due to distraction, very unsafe Attempted in session with RW, pt unsteady, gait ataxic, continued significant difficulty with motor planning requiring up to min a + 2 and multi-modal cues to safely sit in chair.   FUNCTIONAL TESTS:  5 times sit to stand: 1 min 43 sec Timed up and go (TUG): 1 min 30 sec with RW 10 meter walk test: 0.25 m/s with RW extensive cuing to complete activity  PATIENT SURVEYS:  SIS-16 filled out by pt's mother, used as questions relevant to pt: 45                                                                                                                              TREATMENT DATE: 08/23/2023   Unless otherwise stated, at least light min A was provided and gait belt donned in order to ensure pt safety throughout session.'   Gait with RW, x120 ft with intermittent min assist and min-moderate cues for AD management due to easily distracted and veering to Right. Student assisting with w/c follow.   STS transfers with focus on safety and utilizing one word commands (sit, stand) to  optimize efficiency, x5 reps x2 in between ambulation bouts  Gait with bari rollator (to reduce kicking the AD with L LE), x120 ft with intermittent min assist and min-moderate cues for AD management due to easily distracted and veering to Right. Student assisting with w/c follow.    Gait with 2 person HHA assistance with cuing for direction and to keep pt from distraction     PATIENT EDUCATION: Education details: goal reassessment, indications, exercise technique Person educated: Patient and Parent Education method: Explanation, Demonstration, Tactile cues, and Verbal cues Education comprehension: returned demonstration, verbal cues required, tactile cues required, and needs further education  HOME EXERCISE PROGRAM: Access Code: [email or web address] URL: [email or web address] Date: 06/11/2023 Prepared by: Massie Dollar  Exercises - Standing March with Counter Support  - 1 x daily - 7 x weekly - 2 sets - 8 reps - Side Stepping with Counter Support  - 1 x daily - 7 x weekly - 2 sets - 4 reps - Sit to Stand with Armchair  -  1 x daily - 7 x weekly - 2 sets - 5 reps - Seated March  - 1 x daily - 7 x weekly - 3 sets - 10 reps  GOALS: Goals reviewed with patient? Yes   SHORT TERM GOALS: Target date: 07/11/2023  Patient will be independent in home exercise program to improve strength/mobility for better functional independence with ADLs. Baseline: Goal status: INITIAL   LONG TERM GOALS: Target date: 11/13/2023   1.  Patient will complete five times sit to stand test in < 35 seconds indicating an increased LE strength and improved balance. Baseline: previously 37 sec with UUE 04/19/22 today 05/30/23: 1 min 43 seconds; 6/3: 41 sec with improved technique (use of BUE on armrests); 08/21/2023= 38 sec with some distractibility and VC for hand placement.  Goal status: PARTIALLY MET  2. Patient will demo ability to ambulate > 400 ft w/ LRAD versus hand held assist min assist to demonstrate  improved household mobility and short community distances.  Baseline: 4/28: 154ft with RW. Mod assist for AD management to prevent veer to the R. 6/3: ~ 300 ft with RW, requires cuing throughout for proximity to RW particularly with turns, but this has improved compared to eval; 08/21/2023= Patient ambulated approx 300 feet with RW- Sill some min assist at times to navigate walker and VC to remain close to walker.  Goal status: IN PROGRESS  3.  Patient will increase 10 meter walk test to >1.67m/s as to improve gait speed for better community ambulation and to reduce fall risk. Baseline: previously 0.36 m/s on 04/19/22 : 0.25 m/s;  07/10/23: 0. 24 m/s with RW; 08/15/2023= 0.39 m/s  with RW yet max VC for RW navigation.  Goal status: PROGRESSING  4.  Patient will reduce timed up and go (TUG) to <50 seconds to reduce fall risk and demonstrate improved transfer/gait ability Baseline: previously 91 sec with RW on 04/19/22; 05/30/23: 1 min 30 sec with RW 07/19/2023: 48.265 seconds using RW with skilled min A primarily for AD management with pt having difficulty turning with AD; 08/21/2023- 33 sec with HHA and 1 min 25 sec with RW (Max VC for safety with turning back to seat and to reach back safely)  Goal status: IN PROGRESS  5. Pt will exhibit correct and safe technique with stand>sit into a standard chair or transport chair to reduce risk of falling.   Baseline: pt current technique unsafe, poor motor planning; 6/3: pt with improved technique, bring RW closer to chair prior to sitting; 08/21/2023- Patient able to perform with CGA yet inconsistent with cues for safety and consistency with hand placement.   Goal status: PARTIALLY MET   6. The pt will demonstrate at least a 15 point improvement on SIS-16 to indicate increased ease with ADLs. Baseline: 45 (filled out by pt's mother); 42; 08/21/2023= 46 Goal status: ONGOING  ASSESSMENT:  CLINICAL IMPRESSION:   Pt put forth great effort throughout the session,  however is still limited by distractions when attempting to perform tasks.  Pt ultimately was able to utilize different levels of assistance and noted to not be kicking the rollator due to the increased size from utilizing a bariatric rollator.  Pt also able to ambulate with +2 HHA and min-modA applied through the hands, mostly using the L hand when compared to the R.  Pt able to tolerate increased walking distance this session.   Pt will continue to benefit from skilled therapy to address remaining deficits in order to improve overall  QoL and return to PLOF.       OBJECTIVE IMPAIRMENTS: Abnormal gait, decreased activity tolerance, decreased balance, decreased coordination, decreased endurance, decreased knowledge of use of DME, decreased mobility, difficulty walking, decreased strength, decreased safety awareness, increased edema, impaired tone, improper body mechanics, and postural dysfunction.   ACTIVITY LIMITATIONS: carrying, lifting, bending, standing, squatting, stairs, transfers, bathing, dressing, locomotion level, and caring for others  PARTICIPATION LIMITATIONS: meal prep, cleaning, laundry, medication management, personal finances, driving, shopping, community activity, occupation, and yard work  PERSONAL FACTORS: Sex, Time since onset of injury/illness/exacerbation, and 1-2 comorbidities: per chart PMH includes CAD, HTN, ischemic cardiomyopathy, STEMI, ventricular fibrillation, dysphagia, s/p ICD, apraxia  are also affecting patient's functional outcome.   REHAB POTENTIAL: Fair    CLINICAL DECISION MAKING: Evolving/moderate complexity  EVALUATION COMPLEXITY: High  PLAN:  PT FREQUENCY: 1-2x/week  PT DURATION: 12 weeks  PLANNED INTERVENTIONS: 97164- PT Re-evaluation, 97750- Physical Performance Testing, 97110-Therapeutic exercises, 97530- Therapeutic activity, 97112- Neuromuscular re-education, 97535- Self Care, [id number]- Manual therapy, [phone]- Gait training, [phone]- Orthotic Initial,  [phone]- Orthotic/Prosthetic subsequent, [phone]- Canalith repositioning, Patient/Family education, Balance training, Stair training, Taping, Joint mobilization, Spinal mobilization, Vestibular training, DME instructions, Wheelchair mobility training, Cryotherapy, and Moist heat  PLAN FOR NEXT SESSION:   - backwards gait training with skilled assistance when available - dynamic stepping in variable directions Transfer safety training Gait with least restrictive AD vs. HHA    Fonda Simpers, PT, DPT Physical Therapist - [facility]  [facility]  08/23/23, 5:16 PM

## 2023-08-23 NOTE — Therapy (Addendum)
 OUTPATIENT OCCUPATIONAL THERAPY NEURO TREATMENT  Patient Name: Beverly [name] MRN: [id number] DOB:1985-02-10, 38 y.o., female Today's Date: 08/23/2023   REFERRING PROVIDER: Babs Hussar, MD  END OF SESSION:  OT End of Session - 08/23/23 1724     Visit Number 6    Number of Visits 12    Date for OT Re-Evaluation 10/11/23    OT Start Time 1617    OT Stop Time 1700    OT Time Calculation (min) 43 min    Activity Tolerance Patient tolerated treatment well    Behavior During Therapy WFL for tasks assessed/performed          Past Medical History:  Diagnosis Date   Brain injury (HCC)    CAD (coronary artery disease)    HLD (hyperlipidemia)    Hypertension    last pregnancy   Ischemic cardiomyopathy    STEMI (ST elevation myocardial infarction) (HCC) 09/2019   SCAD with aneurysmal dilation of proximal LAD.   Vaginal Pap smear, abnormal    when she was 38yo   Ventricular fibrillation (HCC) 09/23/2019   Past Surgical History:  Procedure Laterality Date   CARDIAC CATHETERIZATION     IR GASTROSTOMY TUBE MOD SED  10/07/2019   IR GASTROSTOMY TUBE REMOVAL  07/02/2020   IR REPLC GASTRO/COLONIC TUBE PERCUT W/FLUORO  11/03/2021   IR REPLC GASTRO/COLONIC TUBE PERCUT W/FLUORO  03/09/2023   LEFT HEART CATH AND CORONARY ANGIOGRAPHY N/A 09/23/2019   Procedure: LEFT HEART CATH AND CORONARY ANGIOGRAPHY;  Surgeon: Mady [name], MD;  Location: [facility] INVASIVE CV LAB;  Service: Cardiovascular;  Laterality: N/A;   SUBQ ICD IMPLANT N/A 11/26/2020   Procedure: SUBQ ICD IMPLANT;  Surgeon: [name], MD;  Location: [facility] INVASIVE CV LAB;  Service: Cardiovascular;  Laterality: N/A;   Patient Active Problem List   Diagnosis Date Noted   Apraxia 10/05/2021   S/P ICD (internal cardiac defibrillator) procedure 11/27/2020   Hx of Cardiac arrest (HCC) 11/26/2020   Abnormality of gait 03/16/2020   Sleep disturbance 03/16/2020   Oropharyngeal dysphagia 02/12/2020   Coronary artery disease  involving native coronary artery of native heart without angina pectoris 12/12/2019   Ischemic cardiomyopathy 12/12/2019   Brain injury (HCC)    Prediabetes    Anoxic brain injury (HCC) 11/05/2019   Dysphagia 11/05/2019   Physical deconditioning 11/05/2019   Acute delirium 11/05/2019   Respiratory failure (HCC)    Encounter for central line placement    Ventricular fibrillation (HCC) 09/23/2019   Acute combined systolic and diastolic heart failure (HCC) 09/23/2019   Ventricular tachycardia, polymorphic (HCC) 09/23/2019   Pelvic pain affecting pregnancy 10/15/2015   Supervision of normal pregnancy in third trimester 09/28/2015   Poor weight gain of pregnancy 09/28/2015   Iron  deficiency anemia of pregnancy 09/01/2015   Increased BMI (body mass index) 07/29/2015   ONSET DATE: 09/23/19  REFERRING DIAG: Anoxic Brain Injury  THERAPY DIAG:  Muscle weakness (generalized)  Other lack of coordination  Rationale for Evaluation and Treatment: Rehabilitation  SUBJECTIVE:  SUBJECTIVE STATEMENT: Pt nodded yes indicating that she understood instructions during [facility] tasks today. Pt accompanied by:  Father  PERTINENT HISTORY:  Pt. Is a 38 y.o. female with a history of severe aphasia  from a [place] Arrest leading to Anoxic Brain Injury on 09/23/19 at 2 weeks postpartum. Pt. Currently hs a G-Tube in place. PMHx includes: CAD, HLD, HTN, Ischemic cardiomyopathy, STEMI, V-Fib, and ICD on 11/29/20.  PRECAUTIONS: None  WEIGHT BEARING RESTRICTIONS: No  PAIN:  Are you having pain? No  FALLS: Has patient fallen in last 6 months? No  LIVING ENVIRONMENT: Lives with: mother Lives in: House/apartment Stairs:  two story; No steps to enter. 10 steps inside, and has a chair lift Has following equipment at home: Walker - 2 wheeled and Wheelchair (manual), stair chair lift  PLOF: Independent  PATIENT GOALS: To improve bilateral hand function for increased engagement in ADLs, and IADL  tasks.  OBJECTIVE:  Note: Objective measures were completed at Evaluation unless otherwise noted.  HAND DOMINANCE: Left  ADLs: Per father report  Transfers/ambulation related to ADLs: Eating:  Dependent/feeding tube Grooming: Independent washing face, assist with hair care  UB Dressing: Assist with donning shirt LB Dressing: Assist with donning LE clothing  Toileting: Assist with transfers, toilet hygiene skills, and clothing negotiation Bathing:  Unknown  IADLs: Per father report Shopping: Total A Light housekeeping: Total A Meal Prep: Total A Medication management:   Total A Financial management: Total A Handwriting: 50% legible printing name only using 1 inch letters.  MOBILITY STATUS: Needs Assist: using walker  POSTURE COMMENTS:   Sitting balance:  fair  ACTIVITY TOLERANCE: Activity tolerance:  Fair   FUNCTIONAL OUTCOME MEASURES: TBD  UPPER EXTREMITY ROM:    Active ROM Right Eval WFL Left Eval [facility]  Shoulder flexion    Shoulder abduction    Shoulder adduction    Shoulder extension    Shoulder internal rotation    Shoulder external rotation    Elbow flexion    Elbow extension    Wrist flexion    Wrist extension    Wrist ulnar deviation    Wrist radial deviation    Wrist pronation    Wrist supination    (Blank rows = not tested)  UPPER EXTREMITY MMT:     MMT Right eval Left eval  Shoulder flexion 3/5 3+/5  Shoulder abduction 3/5 3+/5  Shoulder adduction    Shoulder extension    Shoulder internal rotation    Shoulder external rotation    Middle trapezius    Lower trapezius    Elbow flexion 3/5 3+/5  Elbow extension 3/5 3+/5  Wrist flexion    Wrist extension 3/5 3+/5  Wrist ulnar deviation    Wrist radial deviation    Wrist pronation    Wrist supination    (Blank rows = not tested)  HAND FUNCTION: Grip strength: Right: 10 lbs; Left: 18 lbs, Lateral pinch: Right: 4 lbs, Left: 7 lbs.  COORDINATION:  Nine Hole Peg test: Pt. was able  to formulate isolated 2nd digit extension in anticipation for grasping the horizontal pegs.  SENSATION: Light touch intact  EDEMA: N/A   COGNITION: Overall cognitive status: impaired   VISION:  Baseline:  wears glasses.   VISION ASSESSMENT: To be further assessed in functional context  PERCEPTION: WFL  PRAXIS: WFL                                                                                                       TREATMENT DATE: 08/23/2023  Therapeutic Ex.:  -Facilitated 2-3 repetitions  of Alternating Bilateral lateral key pinches with red/yellow resistive clips in preparation for placing clips onto horizontal dowel.   Therapeutic Activity: -Facilitated alternating BUE GMC/FMC and lateral key pinch strengthening working to place and remove therapy resistive clips (yellow and red only) onto horizontal dowels.   - Pinch strengthening tasks were performed in combination with functional reaching. Pt. Placed red and yellow resistive clips into container in multiple planes to promote shoulder flexion.  -Pt. Performed functional reaching and [facility] skills using 1 flat circular discs to place into board placed in a vertical position to promote bilateral wrist extension and sustained pinch onto items.  -Pt. Removed 1 circular discs  from a flat surface container to place into vertical board.   PATIENT EDUCATION: Education details:  BUE strengthening/coordination training  Person educated: Patient and Parent Education method: Explanation, Demonstration, Tactile cues, and Verbal cues Education comprehension: verbalized understanding, returned demonstration, verbal cues required, and needs further education  HOME EXERCISE PROGRAM:  Continue in an ongoing basis, and provide as indicated.  GOALS: Goals reviewed with patient? Yes  SHORT TERM GOALS: Target date: 08/30/2023    Pt. Will require supervision HEPs for BUE strengthening  Baseline: Eval: No current HEP. Goal status:  INITIAL   LONG TERM GOALS: Target date: 10/11/2023   Pt. Will increase BUE strength by 2 mm grades to assist with ADLs, and IADLs. Baseline:  Eval:  RUE: 3/5 overall, LUE: 3+/5 overall Goal status: INITIAL  2.  Pt. will improve bilateral grip strength by 5# to be able to securely hold items in her hands. Baseline:  Eval: Grip Right: 10#, Left: 18# Goal status: INITIAL  3.  Pt. Will improve bilateral lateral pinch strength by 3# to assist with grooming tasks. Baseline: Eval: R: 4#, L: 7# Goal status: INITIAL  4.  Pt. Will improve bilateral hand FMC grasping, and placing one peg in the 9 hole peg test in preparation for improved manipulation of small objects. Baseline: Eval: Pt. is able to initiate formulating isolated 2nd digit extension in preparation for grasping small objects. Goal status: INITIAL  5.  Pt.  Will write her name her full name with 75% legibility. Baseline: Eval: 50% legible printing name only using 1 inch letters. Goal status: INITIAL  6. Pt.  Will demonstrate modified techniques for ADLs with minA.  Baseline: Eval: Does not currently use adaptive, or modified techniques during ADLS.    ASSESSMENT: CLINICAL IMPRESSION: Pt continues to require extensive multimodal cues to facilitate BUE lateral key and 3 pt pinch strengthening exercises and activities and to maintain attention to task. Pt. Was able to apply all yellow and red resistive clips onto horizontal dowel alternating BUE, however required consistent mod vc and visual demonstration for proper digit placement. Pt. Was able to reach above shoulder height while discarding resistive clips into container. Pt. Was able to sustain pinch onto 1 circular discs during functional reaching task, and successfully discarding chips into vertically positioned board. Pt. continues to benefit from OT services to work on improving BUE strength, grip strength, pinch strength, and bilateral [facility] skills in order to be able to use, and  manipulate objects for ADLs, and IADL tasks, improve writing tasks, and to review modifications  to assist with daily self-care tasks.   PERFORMANCE DEFICITS: in functional skills including ADLs, IADLs, coordination, dexterity, proprioception, ROM, strength, pain, Fine motor control, and UE functional use, cognitive skills including attention, memory, problem solving, and safety awareness, and psychosocial skills including coping strategies, environmental adaptation, and routines  and behaviors.   IMPAIRMENTS: are limiting patient from ADLs, IADLs, and leisure.   CO-MORBIDITIES: may have co-morbidities  that affects occupational performance. Patient will benefit from skilled OT to address above impairments and improve overall function.  MODIFICATION OR ASSISTANCE TO COMPLETE EVALUATION: Min-Moderate modification of tasks or assist with assess necessary to complete an evaluation.  OT OCCUPATIONAL PROFILE AND HISTORY: Detailed assessment: Review of records and additional review of physical, cognitive, psychosocial history related to current functional performance.  CLINICAL DECISION MAKING: Moderate - several treatment options, min-mod task modification necessary  REHAB POTENTIAL: Good  EVALUATION COMPLEXITY: Moderate    PLAN:  OT FREQUENCY: 1x/week  OT DURATION: 12 weeks  PLANNED INTERVENTIONS: 97535 self care/ADL training, 02889 therapeutic exercise, 97530 therapeutic activity, 97112 neuromuscular re-education, 97140 manual therapy, 97018 paraffin, 02989 moist heat, 97760 Orthotic Initial, 97761 Prosthetic Initial, 97763 Orthotic/Prosthetic subsequent, functional mobility training, visual/perceptual remediation/compensation, energy conservation, patient/family education, and DME and/or AE instructions  RECOMMENDED OTHER SERVICES: PT/ST  CONSULTED AND AGREED WITH PLAN OF CARE: Patient  PLAN FOR NEXT SESSION: Treatment  Damien Nap, OTS   This entire session was performed under  direct supervision and direction of a licensed therapist/therapist assistant . I have personally read, edited and approve of the note as written.   Richardson Otter, MS, OTR/L   08/23/2023, 5:26 PM

## 2023-08-27 ENCOUNTER — Ambulatory Visit: Admitting: Physical Therapy

## 2023-08-27 DIAGNOSIS — R262 Difficulty in walking, not elsewhere classified: Secondary | ICD-10-CM

## 2023-08-27 DIAGNOSIS — R269 Unspecified abnormalities of gait and mobility: Secondary | ICD-10-CM

## 2023-08-27 DIAGNOSIS — R2681 Unsteadiness on feet: Secondary | ICD-10-CM

## 2023-08-27 DIAGNOSIS — M6281 Muscle weakness (generalized): Secondary | ICD-10-CM | POA: Diagnosis not present

## 2023-08-27 DIAGNOSIS — G931 Anoxic brain damage, not elsewhere classified: Secondary | ICD-10-CM

## 2023-08-27 DIAGNOSIS — R2689 Other abnormalities of gait and mobility: Secondary | ICD-10-CM

## 2023-08-27 DIAGNOSIS — R278 Other lack of coordination: Secondary | ICD-10-CM

## 2023-08-27 NOTE — Therapy (Signed)
 OUTPATIENT PHYSICAL THERAPY TREATMENT   Patient Name: Beverly [name] MRN: [id number] DOB:10-04-1985, 38 y.o., female Today's Date: 08/27/2023  PCP: [facility] REFERRING PROVIDER: Babs Arthea DASEN, MD  END OF SESSION:   PT End of Session - 08/27/23 1158     Visit Number 19    Number of Visits 41    Date for PT Re-Evaluation 11/13/23    Authorization Type UHC Dual Complete; Medicaid secondary    Authorization Time Period 05/30/23-08/22/23    Progress Note Due on Visit 20    PT Start Time 1104    PT Stop Time 1142    PT Time Calculation (min) 38 min    Equipment Utilized During Treatment Gait belt    Activity Tolerance Patient tolerated treatment well    Behavior During Therapy Flat affect;Impulsive;WFL for tasks assessed/performed           Past Medical History:  Diagnosis Date   Brain injury [facility])    CAD (coronary artery disease)    HLD (hyperlipidemia)    Hypertension    last pregnancy   Ischemic cardiomyopathy    STEMI (ST elevation myocardial infarction) (HCC) 09/2019   SCAD with aneurysmal dilation of proximal LAD.   Vaginal Pap smear, abnormal    when she was 38yo   Ventricular fibrillation (HCC) 09/23/2019   Past Surgical History:  Procedure Laterality Date   CARDIAC CATHETERIZATION     IR GASTROSTOMY TUBE MOD SED  10/07/2019   IR GASTROSTOMY TUBE REMOVAL  07/02/2020   IR REPLC GASTRO/COLONIC TUBE PERCUT W/FLUORO  11/03/2021   IR REPLC GASTRO/COLONIC TUBE PERCUT W/FLUORO  03/09/2023   LEFT HEART CATH AND CORONARY ANGIOGRAPHY N/A 09/23/2019   Procedure: LEFT HEART CATH AND CORONARY ANGIOGRAPHY;  Surgeon: Mady [name], MD;  Location: [facility] INVASIVE CV LAB;  Service: Cardiovascular;  Laterality: N/A;   SUBQ ICD IMPLANT N/A 11/26/2020   Procedure: SUBQ ICD IMPLANT;  Surgeon: [name], MD;  Location: [facility] INVASIVE CV LAB;  Service: Cardiovascular;  Laterality: N/A;   Patient Active Problem List   Diagnosis Date Noted    Apraxia 10/05/2021   S/P ICD (internal cardiac defibrillator) procedure 11/27/2020   Hx of Cardiac arrest (HCC) 11/26/2020   Abnormality of gait 03/16/2020   Sleep disturbance 03/16/2020   Oropharyngeal dysphagia 02/12/2020   Coronary artery disease involving native coronary artery of native heart without angina pectoris 12/12/2019   Ischemic cardiomyopathy 12/12/2019   Brain injury (HCC)    Prediabetes    Anoxic brain injury (HCC) 11/05/2019   Dysphagia 11/05/2019   Physical deconditioning 11/05/2019   Acute delirium 11/05/2019   Respiratory failure (HCC)    Encounter for central line placement    Ventricular fibrillation (HCC) 09/23/2019   Acute combined systolic and diastolic heart failure (HCC) 09/23/2019   Ventricular tachycardia, polymorphic (HCC) 09/23/2019   Pelvic pain affecting pregnancy 10/15/2015   Supervision of normal pregnancy in third trimester 09/28/2015   Poor weight gain of pregnancy 09/28/2015   Iron  deficiency anemia of pregnancy 09/01/2015   Increased BMI (body mass index) 07/29/2015    ONSET DATE: August 2021  REFERRING DIAG:  G93.1 (ICD-10-CM) - Anoxic brain injury (HCC)  R48.2 (ICD-10-CM) - Apraxia    THERAPY DIAG:   Muscle weakness (generalized)  Other lack of coordination  Anoxic brain injury (HCC)  Unsteadiness on feet  Difficulty in walking, not elsewhere classified  Other abnormalities of gait and mobility  Abnormality of gait and mobility  Rationale for Evaluation and  Treatment: Rehabilitation  SUBJECTIVE:                                                                                                                                                                                             SUBJECTIVE STATEMENT:   Pt reports she has not being doing any much.  Pt reports doing well otherwise and has been doing HEP.  Pt accompanied by: self, mother- [name]  PERTINENT HISTORY:   Patient is a 38 year old with diagnois of anoxic  brain injury. Pt known to PT clinic. Previously seen for outpatient services March 2024. Pt was receiving HH PT until October 2024 per her mother's report. [name] reports pt using HHA for ambulation, was using RW but she says was told not to do to difficulty with safe technique. Pt has fallen 1x in the past six months trying to ambulate on her own. Pt requires assistance with ADLs, including bathing and dressing. [name] reports pt never indicates any pain. Pt with severe aphasia so is unable to answer questions/pt's mom provides hx. She reports pt has impaired short-term memory, difficulty with stair climbing (has stair lift at home). She reports pt's BLE are weaker than they used to be. The pt is probably limited to <10 min with ambulation due to poor endurance. Pt reports her goal is to be able to use an AD to achieve mod-I mobility rather than relying on someone's HHA to ambulate.She was receiving outpatient PT until Feb. 2022 last year but ran out of visits. Patient has past medical history of Anoxic Brain Injury 09/2019, CAD, HLD, HTN, ISCHEMIC CARDIMYOPATHY, STEMI, V-FIB, ICD on 11/29/2020. Patient is currently living with mom. Mother reports she is abel to walk some with a walker with supervision but primarily ambulates with HHA of mother who is her guardian. Mother also reports she requires assist for all transfers and ADL's.   PAIN:  Are you having pain? No  PRECAUTIONS: Fall  WEIGHT BEARING RESTRICTIONS: No  FALLS: Has patient fallen in last 6 months? Yes. Number of falls 1  LIVING ENVIRONMENT: Lives with: lives with their family, mother Lives in: House/apartment Stairs: has stairs but stair lift installed, pt using lift Has following equipment at home: Environmental consultant - 2 wheeled, Wheelchair (manual), shower chair, and Grab bars, pt uses stair lift  PLOF: Needs assistance with ADLs  PATIENT GOALS: balance, walking, and pt states basketball  OBJECTIVE:  Note: Objective measures were  completed at Evaluation unless otherwise noted.  DIAGNOSTIC FINDINGS:  None recent in chart  COGNITION: Overall cognitive status: impaired   SENSATION: Unable to complete testing due to  impaired cognition, pt unable to report if sensation impairment present, pt mother unsure   EDEMA:  Mom reports pt's feet do swell from time to time   LOWER EXTREMITY MMT:  *accuracy of testing likely impacted by pt difficulty following cues  MMT Right Eval Left Eval  Hip flexion 4+ 4+  Hip extension    Hip abduction 4+ 4+  Hip adduction 4+ 4+  Hip internal rotation    Hip external rotation    Knee flexion 4+ 4+  Knee extension 5 5  Ankle dorsiflexion    Ankle plantarflexion    Ankle inversion    Ankle eversion    (Blank rows = not tested)  TRANSFERS: HHA  tried in session, very unsteady, pt has difficulty with motor planning, little success with cues due to distraction, very unsafe Attempted in session with RW, pt unsteady, gait ataxic, continued significant difficulty with motor planning requiring up to min a + 2 and multi-modal cues to safely sit in chair.   FUNCTIONAL TESTS:  5 times sit to stand: 1 min 43 sec Timed up and go (TUG): 1 min 30 sec with RW 10 meter walk test: 0.25 m/s with RW extensive cuing to complete activity  PATIENT SURVEYS:  SIS-16 filled out by pt's mother, used as questions relevant to pt: 45                                                                                                                              TREATMENT DATE: 08/27/2023  TA- To improve functional movements patterns for everyday tasks   Unless otherwise stated, at least light min A was provided and gait belt donned in order to ensure pt safety throughout session.'   Brought patient to far hallway ( walk past SLP through double doors and past EVS break room and bathrooms) to quiet hallway infrequently using her to minimize distractions from environment.  Had patient utilize 2 wheeled  walker and completed several bouts of ambulation as well as sit to stands from various chairs placed along hallway.  All bouts of gait physical therapist provided min assist for keeping walker in proper place prolonged student physical therapist guarded patient and provided tactile cues at the hips and thighs when needed. First round 107 ft Second bout x 33 feet with sit to/from stand from [employer] chair.  Patient has difficulty deciding where to put hands and whenever she has a slight failure she gets flustered and does not know where it to utilize upper extremity assist. Third round x 30 feet   with sit to/from stand from armless chair Fourth round x 37 feet  with sit to stand from armless chair 5th round time 72 feet to finish  with sit to/from stand from [employer] chair -On fifth round physical therapist provided cues via tapping feet in front over patient need to step and this was overall successful cue likely due to the sound and visual nature of this.  PATIENT EDUCATION: Education details: goal reassessment, indications, exercise technique Person educated: Patient and Parent Education method: Explanation, Demonstration, Tactile cues, and Verbal cues Education comprehension: returned demonstration, verbal cues required, tactile cues required, and needs further education  HOME EXERCISE PROGRAM: Access Code: [email or web address] URL: [email or web address] Date: 06/11/2023 Prepared by: Massie Dollar  Exercises - Standing March with Counter Support  - 1 x daily - 7 x weekly - 2 sets - 8 reps - Side Stepping with Counter Support  - 1 x daily - 7 x weekly - 2 sets - 4 reps - Sit to Stand with Armchair  - 1 x daily - 7 x weekly - 2 sets - 5 reps - Seated March  - 1 x daily - 7 x weekly - 3 sets - 10 reps  GOALS: Goals reviewed with patient? Yes   SHORT TERM GOALS: Target date: 07/11/2023  Patient will be independent in home exercise program to improve strength/mobility for better  functional independence with ADLs. Baseline: Goal status: INITIAL   LONG TERM GOALS: Target date: 11/13/2023   1.  Patient will complete five times sit to stand test in < 35 seconds indicating an increased LE strength and improved balance. Baseline: previously 37 sec with UUE 04/19/22 today 05/30/23: 1 min 43 seconds; 6/3: 41 sec with improved technique (use of BUE on armrests); 08/21/2023= 38 sec with some distractibility and VC for hand placement.  Goal status: PARTIALLY MET  2. Patient will demo ability to ambulate > 400 ft w/ LRAD versus hand held assist min assist to demonstrate improved household mobility and short community distances.  Baseline: 4/28: 139ft with RW. Mod assist for AD management to prevent veer to the R. 6/3: ~ 300 ft with RW, requires cuing throughout for proximity to RW particularly with turns, but this has improved compared to eval; 08/21/2023= Patient ambulated approx 300 feet with RW- Sill some min assist at times to navigate walker and VC to remain close to walker.  Goal status: IN PROGRESS  3.  Patient will increase 10 meter walk test to >1.29m/s as to improve gait speed for better community ambulation and to reduce fall risk. Baseline: previously 0.36 m/s on 04/19/22 : 0.25 m/s;  07/10/23: 0. 24 m/s with RW; 08/15/2023= 0.39 m/s  with RW yet max VC for RW navigation.  Goal status: PROGRESSING  4.  Patient will reduce timed up and go (TUG) to <50 seconds to reduce fall risk and demonstrate improved transfer/gait ability Baseline: previously 91 sec with RW on 04/19/22; 05/30/23: 1 min 30 sec with RW 07/19/2023: 48.265 seconds using RW with skilled min A primarily for AD management with pt having difficulty turning with AD; 08/21/2023- 33 sec with HHA and 1 min 25 sec with RW (Max VC for safety with turning back to seat and to reach back safely)  Goal status: IN PROGRESS  5. Pt will exhibit correct and safe technique with stand>sit into a standard chair or transport chair to  reduce risk of falling.   Baseline: pt current technique unsafe, poor motor planning; 6/3: pt with improved technique, bring RW closer to chair prior to sitting; 08/21/2023- Patient able to perform with CGA yet inconsistent with cues for safety and consistency with hand placement.   Goal status: PARTIALLY MET   6. The pt will demonstrate at least a 15 point improvement on SIS-16 to indicate increased ease with ADLs. Baseline: 45 (filled out by pt's mother); 42; 08/21/2023= 46 Goal status: ONGOING  ASSESSMENT:  CLINICAL IMPRESSION:  Pt put forth great effort throughout the session, patient was much less limited by distractions this date by going to hallway described in treatment section of note.  Physical therapist had suited physical therapist working alongside him and ordered to minimize need for guarding and maximize cues and walker placement ease.  Patient did well ambulating this hallway did demonstrate some fatigue but overall with proper cues patient worked through this much better.  Patient still inconsistent with sit to stand transfers and requires a lot of assistance from physical therapist for walker management would likely benefit from some lateral training of steps in order to improve this as well as multiple turns with walker as appropriate. Pt will continue to benefit from skilled physical therapy intervention to address impairments, improve QOL, and attain therapy goals.       OBJECTIVE IMPAIRMENTS: Abnormal gait, decreased activity tolerance, decreased balance, decreased coordination, decreased endurance, decreased knowledge of use of DME, decreased mobility, difficulty walking, decreased strength, decreased safety awareness, increased edema, impaired tone, improper body mechanics, and postural dysfunction.   ACTIVITY LIMITATIONS: carrying, lifting, bending, standing, squatting, stairs, transfers, bathing, dressing, locomotion level, and caring for others  PARTICIPATION  LIMITATIONS: meal prep, cleaning, laundry, medication management, personal finances, driving, shopping, community activity, occupation, and yard work  PERSONAL FACTORS: Sex, Time since onset of injury/illness/exacerbation, and 1-2 comorbidities: per chart PMH includes CAD, HTN, ischemic cardiomyopathy, STEMI, ventricular fibrillation, dysphagia, s/p ICD, apraxia  are also affecting patient's functional outcome.   REHAB POTENTIAL: Fair    CLINICAL DECISION MAKING: Evolving/moderate complexity  EVALUATION COMPLEXITY: High  PLAN:  PT FREQUENCY: 1-2x/week  PT DURATION: 12 weeks  PLANNED INTERVENTIONS: 97164- PT Re-evaluation, 97750- Physical Performance Testing, 97110-Therapeutic exercises, 97530- Therapeutic activity, 97112- Neuromuscular re-education, 97535- Self Care, [id number]- Manual therapy, [phone]- Gait training, [phone]- Orthotic Initial, [phone]- Orthotic/Prosthetic subsequent, [phone]- Canalith repositioning, Patient/Family education, Balance training, Stair training, Taping, Joint mobilization, Spinal mobilization, Vestibular training, DME instructions, Wheelchair mobility training, Cryotherapy, and Moist heat  PLAN FOR NEXT SESSION:   - backwards gait training with skilled assistance when available - dynamic stepping in variable directions Transfer safety training Gait with least restrictive AD vs. HHA    Note: Portions of this document were prepared using Dragon voice recognition software and although reviewed may contain unintentional dictation errors in syntax, grammar, or spelling.  Lonni KATHEE Gainer PT ,DPT Physical Therapist- [facility]  08/27/23, 12:07 PM

## 2023-08-28 ENCOUNTER — Ambulatory Visit

## 2023-08-29 ENCOUNTER — Ambulatory Visit: Admitting: Physical Therapy

## 2023-08-29 ENCOUNTER — Ambulatory Visit

## 2023-08-29 DIAGNOSIS — R269 Unspecified abnormalities of gait and mobility: Secondary | ICD-10-CM

## 2023-08-29 DIAGNOSIS — R278 Other lack of coordination: Secondary | ICD-10-CM

## 2023-08-29 DIAGNOSIS — R2689 Other abnormalities of gait and mobility: Secondary | ICD-10-CM

## 2023-08-29 DIAGNOSIS — M6281 Muscle weakness (generalized): Secondary | ICD-10-CM | POA: Diagnosis not present

## 2023-08-29 DIAGNOSIS — R262 Difficulty in walking, not elsewhere classified: Secondary | ICD-10-CM

## 2023-08-29 DIAGNOSIS — R2681 Unsteadiness on feet: Secondary | ICD-10-CM

## 2023-08-29 DIAGNOSIS — G931 Anoxic brain damage, not elsewhere classified: Secondary | ICD-10-CM

## 2023-08-29 NOTE — Therapy (Signed)
 OUTPATIENT PHYSICAL THERAPY TREATMENT/  PHYSICAL THERAPY PROGRESS NOTE   Dates of reporting period  07/10/23   to   08/29/23    Patient Name: Beverly [name] MRN: [id number] DOB:[birth year], 38 y.o., female Today's Date: 08/29/2023  PCP: [facility] REFERRING PROVIDER: Babs Arthea DASEN, MD  END OF SESSION:   PT End of Session - 08/29/23 1511     Visit Number 20    Number of Visits 41    Date for PT Re-Evaluation 11/13/23    Authorization Type UHC Dual Complete; Medicaid secondary    Authorization Time Period 05/30/23-08/22/23    Progress Note Due on Visit 20    PT Start Time 1530    PT Stop Time 1610    PT Time Calculation (min) 40 min    Equipment Utilized During Treatment Gait belt    Activity Tolerance Patient tolerated treatment well    Behavior During Therapy Flat affect;Impulsive;WFL for tasks assessed/performed           Past Medical History:  Diagnosis Date   Brain injury [facility])    CAD (coronary artery disease)    HLD (hyperlipidemia)    Hypertension    last pregnancy   Ischemic cardiomyopathy    STEMI (ST elevation myocardial infarction) (HCC) 09/2019   SCAD with aneurysmal dilation of proximal LAD.   Vaginal Pap smear, abnormal    when she was 38yo   Ventricular fibrillation (HCC) 09/23/2019   Past Surgical History:  Procedure Laterality Date   CARDIAC CATHETERIZATION     IR GASTROSTOMY TUBE MOD SED  10/07/2019   IR GASTROSTOMY TUBE REMOVAL  07/02/2020   IR REPLC GASTRO/COLONIC TUBE PERCUT W/FLUORO  11/03/2021   IR REPLC GASTRO/COLONIC TUBE PERCUT W/FLUORO  03/09/2023   LEFT HEART CATH AND CORONARY ANGIOGRAPHY N/A 09/23/2019   Procedure: LEFT HEART CATH AND CORONARY ANGIOGRAPHY;  Surgeon: Mady [name], MD;  Location: [facility] INVASIVE CV LAB;  Service: Cardiovascular;  Laterality: N/A;   SUBQ ICD IMPLANT N/A 11/26/2020   Procedure: SUBQ ICD IMPLANT;  Surgeon: [name], MD;  Location: [facility] INVASIVE CV LAB;  Service:  Cardiovascular;  Laterality: N/A;   Patient Active Problem List   Diagnosis Date Noted   Apraxia 10/05/2021   S/P ICD (internal cardiac defibrillator) procedure 11/27/2020   Hx of Cardiac arrest (HCC) 11/26/2020   Abnormality of gait 03/16/2020   Sleep disturbance 03/16/2020   Oropharyngeal dysphagia 02/12/2020   Coronary artery disease involving native coronary artery of native heart without angina pectoris 12/12/2019   Ischemic cardiomyopathy 12/12/2019   Brain injury (HCC)    Prediabetes    Anoxic brain injury (HCC) 11/05/2019   Dysphagia 11/05/2019   Physical deconditioning 11/05/2019   Acute delirium 11/05/2019   Respiratory failure (HCC)    Encounter for central line placement    Ventricular fibrillation (HCC) 09/23/2019   Acute combined systolic and diastolic heart failure (HCC) 09/23/2019   Ventricular tachycardia, polymorphic (HCC) 09/23/2019   Pelvic pain affecting pregnancy 10/15/2015   Supervision of normal pregnancy in third trimester 09/28/2015   Poor weight gain of pregnancy 09/28/2015   Iron  deficiency anemia of pregnancy 09/01/2015   Increased BMI (body mass index) 07/29/2015    ONSET DATE: August 2021  REFERRING DIAG:  G93.1 (ICD-10-CM) - Anoxic brain injury (HCC)  R48.2 (ICD-10-CM) - Apraxia    THERAPY DIAG:   Muscle weakness (generalized)  Other lack of coordination  Anoxic brain injury (HCC)  Unsteadiness on feet  Difficulty in walking, not  elsewhere classified  Other abnormalities of gait and mobility  Abnormality of gait and mobility  Rationale for Evaluation and Treatment: Rehabilitation  SUBJECTIVE:                                                                                                                                                                                             SUBJECTIVE STATEMENT:   Pt reports she has not being doing any much.  Pt reports doing well otherwise and has been doing HEP.  Pt accompanied by: self,  mother- [name]  PERTINENT HISTORY:   Patient is a 38 year old with diagnois of anoxic brain injury. Pt known to PT clinic. Previously seen for outpatient services March 2024. Pt was receiving HH PT until October 2024 per her mother's report. [name] reports pt using HHA for ambulation, was using RW but she says was told not to do to difficulty with safe technique. Pt has fallen 1x in the past six months trying to ambulate on her own. Pt requires assistance with ADLs, including bathing and dressing. [name] reports pt never indicates any pain. Pt with severe aphasia so is unable to answer questions/pt's mom provides hx. She reports pt has impaired short-term memory, difficulty with stair climbing (has stair lift at home). She reports pt's BLE are weaker than they used to be. The pt is probably limited to <10 min with ambulation due to poor endurance. Pt reports her goal is to be able to use an AD to achieve mod-I mobility rather than relying on someone's HHA to ambulate.She was receiving outpatient PT until Feb. 2022 last year but ran out of visits. Patient has past medical history of Anoxic Brain Injury 09/2019, CAD, HLD, HTN, ISCHEMIC CARDIMYOPATHY, STEMI, V-FIB, ICD on 11/29/2020. Patient is currently living with mom. Mother reports she is abel to walk some with a walker with supervision but primarily ambulates with HHA of mother who is her guardian. Mother also reports she requires assist for all transfers and ADL's.   PAIN:  Are you having pain? No  PRECAUTIONS: Fall  WEIGHT BEARING RESTRICTIONS: No  FALLS: Has patient fallen in last 6 months? Yes. Number of falls 1  LIVING ENVIRONMENT: Lives with: lives with their family, mother Lives in: House/apartment Stairs: has stairs but stair lift installed, pt using lift Has following equipment at home: Environmental consultant - 2 wheeled, Wheelchair (manual), shower chair, and Grab bars, pt uses stair lift  PLOF: Needs assistance with ADLs  PATIENT  GOALS: balance, walking, and pt states basketball  OBJECTIVE:  Note: Objective measures were completed at Evaluation unless otherwise noted.  DIAGNOSTIC FINDINGS:  None recent in chart  COGNITION: Overall cognitive status: impaired   SENSATION: Unable to complete testing due to impaired cognition, pt unable to report if sensation impairment present, pt mother unsure   EDEMA:  Mom reports pt's feet do swell from time to time   LOWER EXTREMITY MMT:  *accuracy of testing likely impacted by pt difficulty following cues  MMT Right Eval Left Eval  Hip flexion 4+ 4+  Hip extension    Hip abduction 4+ 4+  Hip adduction 4+ 4+  Hip internal rotation    Hip external rotation    Knee flexion 4+ 4+  Knee extension 5 5  Ankle dorsiflexion    Ankle plantarflexion    Ankle inversion    Ankle eversion    (Blank rows = not tested)  TRANSFERS: HHA  tried in session, very unsteady, pt has difficulty with motor planning, little success with cues due to distraction, very unsafe Attempted in session with RW, pt unsteady, gait ataxic, continued significant difficulty with motor planning requiring up to min a + 2 and multi-modal cues to safely sit in chair.   FUNCTIONAL TESTS:  5 times sit to stand: 1 min 43 sec Timed up and go (TUG): 1 min 30 sec with RW 10 meter walk test: 0.25 m/s with RW extensive cuing to complete activity  PATIENT SURVEYS:  SIS-16 filled out by pt's mother, used as questions relevant to pt: 45                                                                                                                              TREATMENT DATE: 08/29/2023   PT instructed pt in goal assessment for progress note.   Pt performed 5 time sit<>stand (5xSTS): 39.06 (41.23, 36.89 sec;>15 sec indicates increased fall risk)   Gait with RW x 356ft with min assist for safety and mod assist/mod instruction for AD management. Pt required increasing instruction to keep BLE within RW to  reduce fal risk and for safety with turns to turn RW, then feet. Pt also noted to stop and let of og RW with the RUE intermittent, requiring cues from PT for safety to utilize BUE on RW for safety.   10 Meter Walk Test: Patient instructed to walk 10 meters (32.8 ft) as quickly and as safely as possible at their normal speed x2 and at a fast speed x2. Time measured from 2 meter mark to 8 meter mark to accommodate ramp-up and ramp-down.  Normal speed with RW 1: 47.5 sec  Normal speed with RW  2: 32.3 sec  Average Normal speed with RW: 0.25 m/s Normal speed with HHA 1: 34.81 sec Normal speed with HHA  2: 27.59sec Average Normal speed with HHA: 0.90m/s  Min assist overall and moderate instruction for safety with RW.  Min-mod assist for safety in turns without AD due to freezing of gait from apraxia, but reduced stops and starts in straight line without AD  Cut off scores: <0.4 m/s = household Ambulator, 0.4-0.8 m/s = limited community Ambulator, >0.8 m/s = community Ambulator, >1.2 m/s = crossing a street, <1.0 = increased fall risk MCID 0.05 m/s (small), 0.13 m/s (moderate), 0.06 m/s (significant)  (ANPTA Core Set of Outcome Measures for Adults with Neurologic Conditions, 2018)  PT instructed pt in TUG: 42.18sec with RW and min assist for safety in turn; 40.35 sec with HHA and min assist for safety with turns.  (average of 3 trials; >13.5 sec indicates increased fall risk)  Forward/reverse gait with RW x 82ft with min assist on first bout and mod assist on second for AD management. Improved sequencing of reverse movement compared to prior sessions with this PT.  Ambulatory transfer to transport chair with min assist and moderate instruction for safety with turn to sit on side of chair.   PATIENT EDUCATION: Education details: goal reassessment, indications, exercise technique Person educated: Patient and Parent Education method: Explanation, Demonstration, Tactile cues, and Verbal cues Education  comprehension: returned demonstration, verbal cues required, tactile cues required, and needs further education  HOME EXERCISE PROGRAM: Access Code: [email or web address] URL: [email or web address] Date: 06/11/2023 Prepared by: Massie Dollar  Exercises - Standing March with Counter Support  - 1 x daily - 7 x weekly - 2 sets - 8 reps - Side Stepping with Counter Support  - 1 x daily - 7 x weekly - 2 sets - 4 reps - Sit to Stand with Armchair  - 1 x daily - 7 x weekly - 2 sets - 5 reps - Seated March  - 1 x daily - 7 x weekly - 3 sets - 10 reps  GOALS: Goals reviewed with patient? Yes   SHORT TERM GOALS: Target date: 07/11/2023  Patient will be independent in home exercise program to improve strength/mobility for better functional independence with ADLs. Baseline: Goal status: INITIAL   LONG TERM GOALS: Target date: 11/13/2023   1.  Patient will complete five times sit to stand test in < 35 seconds indicating an increased LE strength and improved balance. Baseline: previously 37 sec with UUE 04/19/22 today 05/30/23: 1 min 43 seconds; 6/3: 41 sec with improved technique (use of BUE on armrests);  08/21/2023= 38 sec with some distractibility and VC for hand placement 7:23: 39.06 (41.23, 36.89 sec) Goal status: PARTIALLY MET  2. Patient will demo ability to ambulate > 400 ft w/ LRAD versus hand held assist min assist to demonstrate improved household mobility and short community distances.  Baseline: 4/28: 132ft with RW. Mod assist for AD management to prevent veer to the R. 6/3: ~ 300 ft with RW, requires cuing throughout for proximity to RW particularly with turns, but this has improved compared to eval;  08/21/2023= Patient ambulated approx 300 feet with RW- Sill some min assist at times to navigate walker and VC to remain close to walker.  7/23: 389ft with RW and min assist for safety and moderate instruction for safety AD intermittently, especially with fatigue  Goal status: IN  PROGRESS  3.  Patient will increase 10 meter walk test to >1.39m/s as to improve gait speed for better community ambulation and to reduce fall risk. Baseline: previously 0.36 m/s on 04/19/22 : 0.25 m/s;  07/10/23: 0. 24 m/s with RW;  08/15/2023= 0.39 m/s  with RW yet max VC for RW navigation.  7/23:Average Normal speed with RW: 0.25 m/s Average Normal speed with HHA: 0.55m/s Goal status: PROGRESSING  4.  Patient will reduce timed up and go (  TUG) to <50 seconds to reduce fall risk and demonstrate improved transfer/gait ability Baseline: previously 91 sec with RW on 04/19/22; 05/30/23: 1 min 30 sec with RW 07/19/2023: 48.265 seconds using RW with skilled min A primarily for AD management with pt having difficulty turning with AD;  08/21/2023- 33 sec with HHA and 1 min 25 sec with RW (Max VC for safety with turning back to seat and to reach back safely)  7/23:42.18sec with RW and min assist for safety in turn; 40.35 sec with HHA and min assist for safety with turns.  Goal status: IN PROGRESS  5. Pt will exhibit correct and safe technique with stand>sit into a standard chair or transport chair to reduce risk of falling.   Baseline: pt current technique unsafe, poor motor planning; 6/3: pt with improved technique, bring RW closer to chair prior to sitting; 08/21/2023- Patient able to perform with CGA yet inconsistent with cues for safety and consistency with hand placement.   Goal status: PARTIALLY MET   6. The pt will demonstrate at least a 15 point improvement on SIS-16 to indicate increased ease with ADLs. Baseline: 45 (filled out by pt's mother); 42; 08/21/2023= 46 Goal status: ONGOING  ASSESSMENT:  CLINICAL IMPRESSION:   Pt put forth great effort throughout the session, for progress note assessment . Increased distractibility noted on this day.  Demonstrates improved time in TUG, 5xSTS and72mWT compared to Eval, but only maintained from prior re-assessment. Continues to be greatly limited by internal  and external distractions. As well as apraxia limiting safety with turns and transfers from 1 seat to the next.    Patient's condition has the potential to improve in response to therapy. Maximum improvement is yet to be obtained. The anticipated improvement is attainable and reasonable in a generally predictable time.  Pt will continue to benefit from skilled physical therapy intervention to address impairments, improve QOL, and attain therapy goals.       OBJECTIVE IMPAIRMENTS: Abnormal gait, decreased activity tolerance, decreased balance, decreased coordination, decreased endurance, decreased knowledge of use of DME, decreased mobility, difficulty walking, decreased strength, decreased safety awareness, increased edema, impaired tone, improper body mechanics, and postural dysfunction.   ACTIVITY LIMITATIONS: carrying, lifting, bending, standing, squatting, stairs, transfers, bathing, dressing, locomotion level, and caring for others  PARTICIPATION LIMITATIONS: meal prep, cleaning, laundry, medication management, personal finances, driving, shopping, community activity, occupation, and yard work  PERSONAL FACTORS: Sex, Time since onset of injury/illness/exacerbation, and 1-2 comorbidities: per chart PMH includes CAD, HTN, ischemic cardiomyopathy, STEMI, ventricular fibrillation, dysphagia, s/p ICD, apraxia  are also affecting patient's functional outcome.   REHAB POTENTIAL: Fair    CLINICAL DECISION MAKING: Evolving/moderate complexity  EVALUATION COMPLEXITY: High  PLAN:  PT FREQUENCY: 1-2x/week  PT DURATION: 12 weeks  PLANNED INTERVENTIONS: [id number]- PT Re-evaluation, 97750- Physical Performance Testing, 97110-Therapeutic exercises, 97530- Therapeutic activity, [id number]- Neuromuscular re-education, 97535- Self Care, [id number]- Manual therapy, [id number]- Gait training, [phone]- Orthotic Initial, [phone]- Orthotic/Prosthetic subsequent, [phone]- Canalith repositioning, Patient/Family education, Balance  training, Stair training, Taping, Joint mobilization, Spinal mobilization, Vestibular training, DME instructions, Wheelchair mobility training, Cryotherapy, and Moist heat  PLAN FOR NEXT SESSION:   Re-assess SIS 87 with mother's assistance.  backwards gait training with skilled assistance when available with limited distractions  Transfer safety training Gait with least restrictive AD vs. HHA   Massie FORBES Dollar PT ,DPT Physical Therapist- [facility]  08/29/23, 3:13 PM

## 2023-08-29 NOTE — Therapy (Addendum)
 OUTPATIENT OCCUPATIONAL THERAPY NEURO TREATMENT  Patient Name: Beverly [name] MRN: [id number] DOB:1985/10/14, 38 y.o., female Today's Date: 08/29/2023   REFERRING PROVIDER: Babs Hussar, MD  END OF SESSION:  OT End of Session - 08/29/23 1538     Visit Number 7    Number of Visits 12    Date for OT Re-Evaluation 10/11/23    OT Start Time 1445    OT Stop Time 1530    OT Time Calculation (min) 45 min    Activity Tolerance Patient tolerated treatment well    Behavior During Therapy Flat affect;Impulsive;WFL for tasks assessed/performed          Past Medical History:  Diagnosis Date   Brain injury [facility])    CAD (coronary artery disease)    HLD (hyperlipidemia)    Hypertension    last pregnancy   Ischemic cardiomyopathy    STEMI (ST elevation myocardial infarction) (HCC) 09/2019   SCAD with aneurysmal dilation of proximal LAD.   Vaginal Pap smear, abnormal    when she was 38yo   Ventricular fibrillation (HCC) 09/23/2019   Past Surgical History:  Procedure Laterality Date   CARDIAC CATHETERIZATION     IR GASTROSTOMY TUBE MOD SED  10/07/2019   IR GASTROSTOMY TUBE REMOVAL  07/02/2020   IR REPLC GASTRO/COLONIC TUBE PERCUT W/FLUORO  11/03/2021   IR REPLC GASTRO/COLONIC TUBE PERCUT W/FLUORO  03/09/2023   LEFT HEART CATH AND CORONARY ANGIOGRAPHY N/A 09/23/2019   Procedure: LEFT HEART CATH AND CORONARY ANGIOGRAPHY;  Surgeon: Mady [name], MD;  Location: [facility] INVASIVE CV LAB;  Service: Cardiovascular;  Laterality: N/A;   SUBQ ICD IMPLANT N/A 11/26/2020   Procedure: SUBQ ICD IMPLANT;  Surgeon: [name], MD;  Location: [facility] INVASIVE CV LAB;  Service: Cardiovascular;  Laterality: N/A;   Patient Active Problem List   Diagnosis Date Noted   Apraxia 10/05/2021   S/P ICD (internal cardiac defibrillator) procedure 11/27/2020   Hx of Cardiac arrest (HCC) 11/26/2020   Abnormality of gait 03/16/2020   Sleep disturbance 03/16/2020   Oropharyngeal dysphagia 02/12/2020    Coronary artery disease involving native coronary artery of native heart without angina pectoris 12/12/2019   Ischemic cardiomyopathy 12/12/2019   Brain injury (HCC)    Prediabetes    Anoxic brain injury (HCC) 11/05/2019   Dysphagia 11/05/2019   Physical deconditioning 11/05/2019   Acute delirium 11/05/2019   Respiratory failure (HCC)    Encounter for central line placement    Ventricular fibrillation (HCC) 09/23/2019   Acute combined systolic and diastolic heart failure (HCC) 09/23/2019   Ventricular tachycardia, polymorphic (HCC) 09/23/2019   Pelvic pain affecting pregnancy 10/15/2015   Supervision of normal pregnancy in third trimester 09/28/2015   Poor weight gain of pregnancy 09/28/2015   Iron  deficiency anemia of pregnancy 09/01/2015   Increased BMI (body mass index) 07/29/2015   ONSET DATE: 09/23/19  REFERRING DIAG: Anoxic Brain Injury  THERAPY DIAG:  Muscle weakness (generalized)  Other lack of coordination  Anoxic brain injury (HCC)  Rationale for Evaluation and Treatment: Rehabilitation  SUBJECTIVE:  SUBJECTIVE STATEMENT: [name] father reports pt does pretty well getting in and out of the car, but he does use a gait belt. Pt accompanied by:  Father  PERTINENT HISTORY:  Pt. Is a 38 y.o. female with a history of severe aphasia  from a [place] Arrest leading to Anoxic Brain Injury on 09/23/19 at 2 weeks postpartum. Pt. Currently hs a G-Tube in place. PMHx includes: CAD, HLD, HTN, Ischemic cardiomyopathy, STEMI, V-Fib,  and ICD on 11/29/20.  PRECAUTIONS: None  WEIGHT BEARING RESTRICTIONS: No  PAIN:  Are you having pain? No  FALLS: Has patient fallen in last 6 months? No  LIVING ENVIRONMENT: Lives with: mother Lives in: House/apartment Stairs:  two story; No steps to enter. 10 steps inside, and has a chair lift Has following equipment at home: Walker - 2 wheeled and Wheelchair (manual), stair chair lift  PLOF: Independent  PATIENT GOALS: To improve bilateral  hand function for increased engagement in ADLs, and IADL tasks.  OBJECTIVE:  Note: Objective measures were completed at Evaluation unless otherwise noted.  HAND DOMINANCE: Left  ADLs: Per father report  Transfers/ambulation related to ADLs: Eating:  Dependent/feeding tube Grooming: Independent washing face, assist with hair care  UB Dressing: Assist with donning shirt LB Dressing: Assist with donning LE clothing  Toileting: Assist with transfers, toilet hygiene skills, and clothing negotiation Bathing:  Unknown  IADLs: Per father report Shopping: Total A Light housekeeping: Total A Meal Prep: Total A Medication management:   Total A Financial management: Total A Handwriting: 50% legible printing name only using 1 inch letters.  MOBILITY STATUS: Needs Assist: using walker  POSTURE COMMENTS:   Sitting balance:  fair  ACTIVITY TOLERANCE: Activity tolerance:  Fair   FUNCTIONAL OUTCOME MEASURES: TBD  UPPER EXTREMITY ROM:    Active ROM Right Eval WFL Left Eval [facility]  Shoulder flexion    Shoulder abduction    Shoulder adduction    Shoulder extension    Shoulder internal rotation    Shoulder external rotation    Elbow flexion    Elbow extension    Wrist flexion    Wrist extension    Wrist ulnar deviation    Wrist radial deviation    Wrist pronation    Wrist supination    (Blank rows = not tested)  UPPER EXTREMITY MMT:     MMT Right eval Left eval  Shoulder flexion 3/5 3+/5  Shoulder abduction 3/5 3+/5  Shoulder adduction    Shoulder extension    Shoulder internal rotation    Shoulder external rotation    Middle trapezius    Lower trapezius    Elbow flexion 3/5 3+/5  Elbow extension 3/5 3+/5  Wrist flexion    Wrist extension 3/5 3+/5  Wrist ulnar deviation    Wrist radial deviation    Wrist pronation    Wrist supination    (Blank rows = not tested)  HAND FUNCTION: Grip strength: Right: 10 lbs; Left: 18 lbs, Lateral pinch: Right: 4 lbs, Left: 7  lbs.  COORDINATION:  Nine Hole Peg test: Pt. was able to formulate isolated 2nd digit extension in anticipation for grasping the horizontal pegs.  SENSATION: Light touch intact  EDEMA: N/A   COGNITION: Overall cognitive status: impaired   VISION:  Baseline:  wears glasses.   VISION ASSESSMENT: To be further assessed in functional context  PERCEPTION: WFL  PRAXIS: WFL  TREATMENT DATE: 08/29/2023 Therapeutic Ex.:  -Passive and AAROM performed to BUEs through all joint ranges of the shoulders, elbows, wrists, and hands, working to decrease stiffness and prep for functional reaching/coordination tasks.  Self Care: -Trials of reaching with either hand for spit cup on table top and wiping mouth with tissue.  Pt tends to drool after spitting, and cracked a plastic cup d/t over gripping.  Pt required max vc and tactile cues to release tissue from R hand onto table top. -ADL routines reviewed with father, but briefly, as he states mother assists pt with BADLs.  OT called mother during session to review goals, but phone went to voicemail.  Encouraged father have pt's mother call to discuss OT goals with OT when available.  -Encouraged father communicate to mother to encourage keeping pt's nails shorter as to protect skin integrity of palm.  Therapeutic Activity: -Facilitated alternating BUE GMC/FMC skills working to pick up Connect 4 chips from [employer] on table top and drop into vertical slotted game board.  Pt requires OT assist to set up peg between thumb and IF of R hand, as pt otherwise grasps in palm.  Pt inconsistent on the L, and occasionally able to manage her own set up and place successfully with extra time.  Increased difficulty releasing items from the R hand. -Added cognitive component with pt asked to choose a black vs red chip.  PATIENT EDUCATION: Education details:  nail  care recommendations protect skin integrity of palm Person educated: Transport planner: Leisure centre manager cues Education comprehension: verbalized understanding  HOME EXERCISE PROGRAM:  Continue in an ongoing basis, and provide as indicated.  GOALS: Goals reviewed with patient? Yes  SHORT TERM GOALS: Target date: 08/30/2023    Pt. Will require supervision HEPs for BUE strengthening  Baseline: Eval: No current HEP. Goal status: INITIAL   LONG TERM GOALS: Target date: 10/11/2023   Pt. Will increase BUE strength by 2 mm grades to assist with ADLs, and IADLs. Baseline:  Eval:  RUE: 3/5 overall, LUE: 3+/5 overall Goal status: INITIAL  2.  Pt. will improve bilateral grip strength by 5# to be able to securely hold items in her hands. Baseline:  Eval: Grip Right: 10#, Left: 18# Goal status: INITIAL  3.  Pt. Will improve bilateral lateral pinch strength by 3# to assist with grooming tasks. Baseline: Eval: R: 4#, L: 7# Goal status: INITIAL  4.  Pt. Will improve bilateral hand FMC grasping, and placing one peg in the 9 hole peg test in preparation for improved manipulation of small objects. Baseline: Eval: Pt. is able to initiate formulating isolated 2nd digit extension in preparation for grasping small objects. Goal status: INITIAL  5.  Pt.  Will write her name her full name with 75% legibility. Baseline: Eval: 50% legible printing name only using 1 inch letters. Goal status: INITIAL  6. Pt.  Will demonstrate modified techniques for ADLs with minA.  Baseline: Eval: Does not currently use adaptive, or modified techniques during ADLS.   7.  Pt will brush teeth with set up and mod A for thoroughness.  Baseline: Dep  Goal status: New (08/29/23)  8.  Pt will don tshirt with min A.  Baseline: Max-dep; mother reports pt gets her shirt turned around and often struggles to pull over her head  Goal status: New (08/29/23  9. Pt will spit saliva into cup using cup with handle and  wipe mouth with washcloth/hand towel with supv-min A.  Baseline: Max-dep; pt squeezes  plastic cup too hard/paper cup slips from hands.  Pt struggles to wipe mouth thoroughly with napkin or tissue.  Goal status: New (08/29/23)   ASSESSMENT: CLINICAL IMPRESSION: Pt tolerated BUE therapeutic exercises and activities well this date, though extensive multimodal cueing required d/t pt's decreased cognition and attention to task.  Pt struggles with releasing objects from R hand more so than the L.  With tissue in R hand, pt responded well to OT demo of forcefully throwing a tissue to the table top to promote digit ext in R hand; pt able to return demo.  Increased difficulty grasping smaller items from the table top with R hand (Connect 4 piece), more able with the L.  OT reviewed limitations with progress towards goals with father, d/t limited ability to consistently follow 1 step commands, which then contributes to therapist assisting with many tasks hand over head to increase understanding, and ultimately limiting reps and the physical benefit that pt would receive from a task.  Anticipate benefit of reviewing ADLs with mother, as mother is pt's primary caregiver, in order to identify any specific ADL components in which OT could review with mother to improve ease of caregiver assist, increase pt's participation, or increase pt's safety with remaining OT visits.  Father unable to answer these questions re: pt's participation in ADLs.  OT did attempt to call mother today but phone went to vm.  Father agreed to ask mother to call clinic to review OT goals.  Pt. continues to benefit from OT services to maximize pt's participation in ADLs, increase ADL safety, and provide caregiver education as needed to ease caregiver assist with ADLs.   Addendum: *OT able to speak with mother via phone after OT session.  OT able to review ADL recommendations and discuss ADL goals.  Mother would love for pt to be able to brush her  teeth, but reports pt with difficulty manipulating tooth brush.  May try built up handle in OT session.  Mother assists with bathing but encourages pt to wash what she can, though pt lacks thoroughness and often drops washcloth.  OT encouraged trial of bath mit, sponge with strap, or looping loufa string around wrist to reduce dropping.  Recommended family have pt try strong plastic or aluminum cup with handle for pt's spit cup/wash cloth to wipe mouth.  Pt broke plastic cup in clinic d/t squeezing too hard and struggled to grasp paper cup (slippery).  May benefit from cup with handle to increase indep with spitting. Tissue too small to wipe mouth; may try washcloth to increase indep and reduce dropping. Goals updated after reviewing with mother via phone call.  PERFORMANCE DEFICITS: in functional skills including ADLs, IADLs, coordination, dexterity, proprioception, ROM, strength, pain, Fine motor control, and UE functional use, cognitive skills including attention, memory, problem solving, and safety awareness, and psychosocial skills including coping strategies, environmental adaptation, and routines and behaviors.   IMPAIRMENTS: are limiting patient from ADLs, IADLs, and leisure.   CO-MORBIDITIES: may have co-morbidities  that affects occupational performance. Patient will benefit from skilled OT to address above impairments and improve overall function.  MODIFICATION OR ASSISTANCE TO COMPLETE EVALUATION: Min-Moderate modification of tasks or assist with assess necessary to complete an evaluation.  OT OCCUPATIONAL PROFILE AND HISTORY: Detailed assessment: Review of records and additional review of physical, cognitive, psychosocial history related to current functional performance.  CLINICAL DECISION MAKING: Moderate - several treatment options, min-mod task modification necessary  REHAB POTENTIAL: Good  EVALUATION COMPLEXITY: Moderate  PLAN:  OT FREQUENCY: 1x/week  OT DURATION: 12  weeks  PLANNED INTERVENTIONS: 97535 self care/ADL training, 02889 therapeutic exercise, 97530 therapeutic activity, 97112 neuromuscular re-education, 97140 manual therapy, 97018 paraffin, 02989 moist heat, 97760 Orthotic Initial, 97761 Prosthetic Initial, 97763 Orthotic/Prosthetic subsequent, functional mobility training, visual/perceptual remediation/compensation, energy conservation, patient/family education, and DME and/or AE instructions  RECOMMENDED OTHER SERVICES: PT/ST  CONSULTED AND AGREED WITH PLAN OF CARE: Patient  PLAN FOR NEXT SESSION: Treatment  Inocente Blazing, MS, OTR/L

## 2023-08-31 ENCOUNTER — Ambulatory Visit: Admitting: Physical Therapy

## 2023-09-03 ENCOUNTER — Ambulatory Visit: Admitting: Occupational Therapy

## 2023-09-03 ENCOUNTER — Ambulatory Visit: Admitting: Physical Therapy

## 2023-09-03 DIAGNOSIS — R2681 Unsteadiness on feet: Secondary | ICD-10-CM

## 2023-09-03 DIAGNOSIS — M6281 Muscle weakness (generalized): Secondary | ICD-10-CM

## 2023-09-03 DIAGNOSIS — R278 Other lack of coordination: Secondary | ICD-10-CM

## 2023-09-03 DIAGNOSIS — G931 Anoxic brain damage, not elsewhere classified: Secondary | ICD-10-CM

## 2023-09-03 DIAGNOSIS — R2689 Other abnormalities of gait and mobility: Secondary | ICD-10-CM

## 2023-09-03 DIAGNOSIS — R269 Unspecified abnormalities of gait and mobility: Secondary | ICD-10-CM

## 2023-09-03 DIAGNOSIS — R262 Difficulty in walking, not elsewhere classified: Secondary | ICD-10-CM

## 2023-09-03 NOTE — Therapy (Cosign Needed)
 OUTPATIENT OCCUPATIONAL THERAPY NEURO TREATMENT  Patient Name: Beverly [name] MRN: [id number] DOB:05-27-1985, 38 y.o., female Today's Date: 09/03/2023   REFERRING PROVIDER: Babs Hussar, MD  END OF SESSION:  OT End of Session - 09/03/23 1717     Visit Number 8    Number of Visits 12    Date for OT Re-Evaluation 10/11/23    OT Start Time 1150    OT Stop Time 1230    OT Time Calculation (min) 40 min    Activity Tolerance Patient tolerated treatment well    Behavior During Therapy WFL for tasks assessed/performed           Past Medical History:  Diagnosis Date   Brain injury (HCC)    CAD (coronary artery disease)    HLD (hyperlipidemia)    Hypertension    last pregnancy   Ischemic cardiomyopathy    STEMI (ST elevation myocardial infarction) (HCC) 09/2019   SCAD with aneurysmal dilation of proximal LAD.   Vaginal Pap smear, abnormal    when she was 38yo   Ventricular fibrillation (HCC) 09/23/2019   Past Surgical History:  Procedure Laterality Date   CARDIAC CATHETERIZATION     IR GASTROSTOMY TUBE MOD SED  10/07/2019   IR GASTROSTOMY TUBE REMOVAL  07/02/2020   IR REPLC GASTRO/COLONIC TUBE PERCUT W/FLUORO  11/03/2021   IR REPLC GASTRO/COLONIC TUBE PERCUT W/FLUORO  03/09/2023   LEFT HEART CATH AND CORONARY ANGIOGRAPHY N/A 09/23/2019   Procedure: LEFT HEART CATH AND CORONARY ANGIOGRAPHY;  Surgeon: Mady [name], MD;  Location: [facility] INVASIVE CV LAB;  Service: Cardiovascular;  Laterality: N/A;   SUBQ ICD IMPLANT N/A 11/26/2020   Procedure: SUBQ ICD IMPLANT;  Surgeon: [name], MD;  Location: [facility] INVASIVE CV LAB;  Service: Cardiovascular;  Laterality: N/A;   Patient Active Problem List   Diagnosis Date Noted   Apraxia 10/05/2021   S/P ICD (internal cardiac defibrillator) procedure 11/27/2020   Hx of Cardiac arrest (HCC) 11/26/2020   Abnormality of gait 03/16/2020   Sleep disturbance 03/16/2020   Oropharyngeal dysphagia 02/12/2020   Coronary artery  disease involving native coronary artery of native heart without angina pectoris 12/12/2019   Ischemic cardiomyopathy 12/12/2019   Brain injury (HCC)    Prediabetes    Anoxic brain injury (HCC) 11/05/2019   Dysphagia 11/05/2019   Physical deconditioning 11/05/2019   Acute delirium 11/05/2019   Respiratory failure (HCC)    Encounter for central line placement    Ventricular fibrillation (HCC) 09/23/2019   Acute combined systolic and diastolic heart failure (HCC) 09/23/2019   Ventricular tachycardia, polymorphic (HCC) 09/23/2019   Pelvic pain affecting pregnancy 10/15/2015   Supervision of normal pregnancy in third trimester 09/28/2015   Poor weight gain of pregnancy 09/28/2015   Iron  deficiency anemia of pregnancy 09/01/2015   Increased BMI (body mass index) 07/29/2015   ONSET DATE: 09/23/19  REFERRING DIAG: Anoxic Brain Injury  THERAPY DIAG:  Muscle weakness (generalized)  Other lack of coordination  Rationale for Evaluation and Treatment: Rehabilitation  SUBJECTIVE:  SUBJECTIVE STATEMENT: [name] reports that pt. Has difficulty with sustaining grasp onto ADL items during bathing/grooming tasks at home.  Pt accompanied by:  Father  PERTINENT HISTORY:  Pt. Is a 38 y.o. female with a history of severe aphasia  from a [place] Arrest leading to Anoxic Brain Injury on 09/23/19 at 2 weeks postpartum. Pt. Currently hs a G-Tube in place. PMHx includes: CAD, HLD, HTN, Ischemic cardiomyopathy, STEMI, V-Fib, and ICD on 11/29/20.  PRECAUTIONS: None  WEIGHT BEARING RESTRICTIONS: No  PAIN:  Are you having pain? No  FALLS: Has patient fallen in last 6 months? No  LIVING ENVIRONMENT: Lives with: mother Lives in: House/apartment Stairs:  two story; No steps to enter. 10 steps inside, and has a chair lift Has following equipment at home: Walker - 2 wheeled and Wheelchair (manual), stair chair lift  PLOF: Independent  PATIENT GOALS: To improve bilateral hand function for  increased engagement in ADLs, and IADL tasks.  OBJECTIVE:  Note: Objective measures were completed at Evaluation unless otherwise noted.  HAND DOMINANCE: Left  ADLs: Per father report  Transfers/ambulation related to ADLs: Eating:  Dependent/feeding tube Grooming: Independent washing face, assist with hair care  UB Dressing: Assist with donning shirt LB Dressing: Assist with donning LE clothing  Toileting: Assist with transfers, toilet hygiene skills, and clothing negotiation Bathing:  Unknown  IADLs: Per father report Shopping: Total A Light housekeeping: Total A Meal Prep: Total A Medication management:   Total A Financial management: Total A Handwriting: 50% legible printing name only using 1 inch letters.  MOBILITY STATUS: Needs Assist: using walker  POSTURE COMMENTS:   Sitting balance:  fair  ACTIVITY TOLERANCE: Activity tolerance:  Fair   FUNCTIONAL OUTCOME MEASURES: TBD  UPPER EXTREMITY ROM:    Active ROM Right Eval WFL Left Eval [facility]  Shoulder flexion    Shoulder abduction    Shoulder adduction    Shoulder extension    Shoulder internal rotation    Shoulder external rotation    Elbow flexion    Elbow extension    Wrist flexion    Wrist extension    Wrist ulnar deviation    Wrist radial deviation    Wrist pronation    Wrist supination    (Blank rows = not tested)  UPPER EXTREMITY MMT:     MMT Right eval Left eval  Shoulder flexion 3/5 3+/5  Shoulder abduction 3/5 3+/5  Shoulder adduction    Shoulder extension    Shoulder internal rotation    Shoulder external rotation    Middle trapezius    Lower trapezius    Elbow flexion 3/5 3+/5  Elbow extension 3/5 3+/5  Wrist flexion    Wrist extension 3/5 3+/5  Wrist ulnar deviation    Wrist radial deviation    Wrist pronation    Wrist supination    (Blank rows = not tested)  HAND FUNCTION: Grip strength: Right: 10 lbs; Left: 18 lbs, Lateral pinch: Right: 4 lbs, Left: 7  lbs.  COORDINATION:  Nine Hole Peg test: Pt. was able to formulate isolated 2nd digit extension in anticipation for grasping the horizontal pegs.  SENSATION: Light touch intact  EDEMA: N/A   COGNITION: Overall cognitive status: impaired   VISION:  Baseline:  wears glasses.   VISION ASSESSMENT: To be further assessed in functional context  PERCEPTION: WFL  PRAXIS: WFL                                                                                                       TREATMENT DATE: 09/03/2023  Self Care: -Reviewed the updated Self-Care goals addressing UB dressing, bathing, use of ADL items at home with Pt./Pt. Caregiver. -Pt. education was provided with Pt. and Pt. Caregiver on A/E use for completing bathing tasks using an adaptive bath mit soap holder with a wrist band to be able to secure it to her wrist while using it when washing. Pt. demonstrated proper use of bath mit with Total A. Pt. required Max vc, visual demonstration, and tactile cues to simulate washing her her arms, and legs with the bath mit in place. Reviewed multiple options for bathing. -Pt./caregiver education was provided about built-up adaptive foam handles for a toothbrush.   Therapeutic Activity: -Facilitated Bilateral Lateral, and 3pt. Pinch strengthening using yellow, red, and green level resistive clips to clip onto vertical dowel placed at tabletop surface.  -Facilitated FMC grasp for 1 resistive cubes, placed at a vertical angle to encourage wrist extension. Emphasis was placed on removing resisitve cubes from board and placing resisitve cubes onto targeted surface, isolating the 2nd digit to securely apply resisitve cube to board.   PATIENT EDUCATION: Education details:  nail care recommendations protect skin integrity of palm Person educated: Transport planner: Leisure centre manager cues Education comprehension: verbalized understanding  HOME EXERCISE PROGRAM:  Continue in an ongoing  basis, and provide as indicated.  GOALS: Goals reviewed with patient? Yes  SHORT TERM GOALS: Target date: 08/30/2023    Pt. Will require supervision HEPs for BUE strengthening  Baseline: Eval: No current HEP. Goal status: INITIAL   LONG TERM GOALS: Target date: 10/11/2023   Pt. Will increase BUE strength by 2 mm grades to assist with ADLs, and IADLs. Baseline:  Eval:  RUE: 3/5 overall, LUE: 3+/5 overall Goal status: INITIAL  2.  Pt. will improve bilateral grip strength by 5# to be able to securely hold items in her hands. Baseline:  Eval: Grip Right: 10#, Left: 18# Goal status: INITIAL  3.  Pt. Will improve bilateral lateral pinch strength by 3# to assist with grooming tasks. Baseline: Eval: R: 4#, L: 7# Goal status: INITIAL  4.  Pt. Will improve bilateral hand FMC grasping, and placing one peg in the 9 hole peg test in preparation for improved manipulation of small objects. Baseline: Eval: Pt. is able to initiate formulating isolated 2nd digit extension in preparation for grasping small objects. Goal status: INITIAL  5.  Pt.  Will write her name her full name with 75% legibility. Baseline: Eval: 50% legible printing name only using 1 inch letters. Goal status: INITIAL  6. Pt.  Will demonstrate modified techniques for ADLs with minA.  Baseline: Eval: Does not currently use adaptive, or modified techniques during ADLS.   7.  Pt will brush teeth with set up and mod A for thoroughness.  Baseline: Dep  Goal status: New (08/29/23)  8.  Pt will don tshirt with min A.  Baseline: Max-dep; mother reports pt gets her shirt turned around and often struggles to pull over her head  Goal status: New (08/29/23  9. Pt will spit saliva into cup using cup with handle and wipe mouth with washcloth/hand towel with supv-min A.  Baseline: Max-dep; pt squeezes plastic cup too hard/paper cup slips from hands.  Pt struggles to wipe mouth thoroughly with napkin or tissue.  Goal status: New  (08/29/23)   ASSESSMENT: CLINICAL IMPRESSION:  Pt. Tolerated A/E use with universal cuff and bath mit, however required extensive multimodal cueing required d/t pt's decreased cognition and attention to task.  Pt.  Continues to have difficulty with grasping and releasing items with R hand, as well as having difficulty with performing a 3 pt. Pinch and a lateral key pinch. Pt. was unable to perform pinch strengthening today d/t decreased attention to task and being unable to formulate 3 pt. And lateral key pinch. Pt. has difficulty with grasping and releasing resisitve cubes from board, placed at vertical position. Pt. Continues to require multimodal cues in order to complete tasks during treatment session. Pt. Caregiver reports that she applied a new patch last night to decrease saliva, and did not require as much breaks to discard saliva into cup during today's session. Pt. Demonstrated proper use of bath mit with TotalA to [employer]. Pt. required Max vc, visual demonstration, and tactile cues to simulate washing her body with bath mit. Pt. Caregiver reports that Pt. Is having difficulty donning a T-shirt, as well as consistently dropping items during ADL tasks, such as toothbrush and washcloths. Pt. continues to benefit from OT services to maximize pt's participation in ADLs, increase ADL safety, and provide caregiver education as needed to ease caregiver assist with ADLs.     PERFORMANCE DEFICITS: in functional skills including ADLs, IADLs, coordination, dexterity, proprioception, ROM, strength, pain, Fine motor control, and UE functional use, cognitive skills including attention, memory, problem solving, and safety awareness, and psychosocial skills including coping strategies, environmental adaptation, and routines and behaviors.   IMPAIRMENTS: are limiting patient from ADLs, IADLs, and leisure.   CO-MORBIDITIES: may have co-morbidities  that affects occupational performance. Patient will  benefit from skilled OT to address above impairments and improve overall function.  MODIFICATION OR ASSISTANCE TO COMPLETE EVALUATION: Min-Moderate modification of tasks or assist with assess necessary to complete an evaluation.  OT OCCUPATIONAL PROFILE AND HISTORY: Detailed assessment: Review of records and additional review of physical, cognitive, psychosocial history related to current functional performance.  CLINICAL DECISION MAKING: Moderate - several treatment options, min-mod task modification necessary  REHAB POTENTIAL: Good  EVALUATION COMPLEXITY: Moderate    PLAN:  OT FREQUENCY: 1x/week  OT DURATION: 12 weeks  PLANNED INTERVENTIONS: 97535 self care/ADL training, 02889 therapeutic exercise, 97530 therapeutic activity, 97112 neuromuscular re-education, 97140 manual therapy, 97018 paraffin, 02989 moist heat, 97760 Orthotic Initial, 97761 Prosthetic Initial, 97763 Orthotic/Prosthetic subsequent, functional mobility training, visual/perceptual remediation/compensation, energy conservation, patient/family education, and DME and/or AE instructions  RECOMMENDED OTHER SERVICES: PT/ST  CONSULTED AND AGREED WITH PLAN OF CARE: Patient  PLAN FOR NEXT SESSION: Treatment  Damien Nap, OTS   This entire session was performed under direct supervision and direction of a licensed therapist/therapist assistant . I have personally read, edited and approve of the note as written.  Richardson Otter, MS, OTR/L   09/04/23

## 2023-09-03 NOTE — Therapy (Signed)
 OUTPATIENT PHYSICAL THERAPY TREATMENT    Patient Name: Beverly [name] MRN: [id number] DOB:01-Oct-1985, 38 y.o., female Today's Date: 09/03/2023  PCP: [facility] REFERRING PROVIDER: Babs Arthea DASEN, MD  END OF SESSION:   PT End of Session - 09/03/23 1102     Visit Number 21    Number of Visits 41    Date for PT Re-Evaluation 11/13/23    Authorization Type UHC Dual Complete; Medicaid secondary    Authorization Time Period 05/30/23-08/22/23    Progress Note Due on Visit 30    PT Start Time 1102    PT Stop Time 1147    PT Time Calculation (min) 45 min    Equipment Utilized During Treatment Gait belt    Activity Tolerance Patient tolerated treatment well    Behavior During Therapy Flat affect;Impulsive;WFL for tasks assessed/performed            Past Medical History:  Diagnosis Date   Brain injury [facility])    CAD (coronary artery disease)    HLD (hyperlipidemia)    Hypertension    last pregnancy   Ischemic cardiomyopathy    STEMI (ST elevation myocardial infarction) (HCC) 09/2019   SCAD with aneurysmal dilation of proximal LAD.   Vaginal Pap smear, abnormal    when she was 38yo   Ventricular fibrillation (HCC) 09/23/2019   Past Surgical History:  Procedure Laterality Date   CARDIAC CATHETERIZATION     IR GASTROSTOMY TUBE MOD SED  10/07/2019   IR GASTROSTOMY TUBE REMOVAL  07/02/2020   IR REPLC GASTRO/COLONIC TUBE PERCUT W/FLUORO  11/03/2021   IR REPLC GASTRO/COLONIC TUBE PERCUT W/FLUORO  03/09/2023   LEFT HEART CATH AND CORONARY ANGIOGRAPHY N/A 09/23/2019   Procedure: LEFT HEART CATH AND CORONARY ANGIOGRAPHY;  Surgeon: Mady [name], MD;  Location: [facility] INVASIVE CV LAB;  Service: Cardiovascular;  Laterality: N/A;   SUBQ ICD IMPLANT N/A 11/26/2020   Procedure: SUBQ ICD IMPLANT;  Surgeon: [name], MD;  Location: [facility] INVASIVE CV LAB;  Service: Cardiovascular;  Laterality: N/A;   Patient Active Problem List   Diagnosis Date Noted    Apraxia 10/05/2021   S/P ICD (internal cardiac defibrillator) procedure 11/27/2020   Hx of Cardiac arrest (HCC) 11/26/2020   Abnormality of gait 03/16/2020   Sleep disturbance 03/16/2020   Oropharyngeal dysphagia 02/12/2020   Coronary artery disease involving native coronary artery of native heart without angina pectoris 12/12/2019   Ischemic cardiomyopathy 12/12/2019   Brain injury (HCC)    Prediabetes    Anoxic brain injury (HCC) 11/05/2019   Dysphagia 11/05/2019   Physical deconditioning 11/05/2019   Acute delirium 11/05/2019   Respiratory failure (HCC)    Encounter for central line placement    Ventricular fibrillation (HCC) 09/23/2019   Acute combined systolic and diastolic heart failure (HCC) 09/23/2019   Ventricular tachycardia, polymorphic (HCC) 09/23/2019   Pelvic pain affecting pregnancy 10/15/2015   Supervision of normal pregnancy in third trimester 09/28/2015   Poor weight gain of pregnancy 09/28/2015   Iron  deficiency anemia of pregnancy 09/01/2015   Increased BMI (body mass index) 07/29/2015    ONSET DATE: August 2021  REFERRING DIAG:  G93.1 (ICD-10-CM) - Anoxic brain injury (HCC)  R48.2 (ICD-10-CM) - Apraxia    THERAPY DIAG:   Muscle weakness (generalized)  Other lack of coordination  Anoxic brain injury (HCC)  Unsteadiness on feet  Difficulty in walking, not elsewhere classified  Other abnormalities of gait and mobility  Abnormality of gait and mobility  Rationale for  Evaluation and Treatment: Rehabilitation  SUBJECTIVE:                                                                                                                                                                                             SUBJECTIVE STATEMENT:   Pt reports she is doing fine. Denies pain. Denies stumbles/falls. [name] states she sometimes gives patient the RW to walk in the living room, but otherwise since it is such short distances in the house, she  primarily provides patient HHA. [name] reports patient primarily goes in the community via dependent manual wheelchair propulsion if going long distances vs HHA in familiar and short distance community environments. However, states when they go to family events she provides pt with HHA and pt will ambulate. Mother states she varies which HHA she provides based on patient's stability that day.   [name] reports she is still trying to have patient perform HEP at a bar counter for support, but some days are more successful than others.   Pt accompanied by: self, mother- Beverly [name]  PERTINENT HISTORY:   Patient is a 38 year old with diagnois of anoxic brain injury. Pt known to PT clinic. Previously seen for outpatient services March 2024. Pt was receiving HH PT until October 2024 per her mother's report. [name] reports pt using HHA for ambulation, was using RW but she says was told not to do to difficulty with safe technique. Pt has fallen 1x in the past six months trying to ambulate on her own. Pt requires assistance with ADLs, including bathing and dressing. [name] reports pt never indicates any pain. Pt with severe aphasia so is unable to answer questions/pt's mom provides hx. She reports pt has impaired short-term memory, difficulty with stair climbing (has stair lift at home). She reports pt's BLE are weaker than they used to be. The pt is probably limited to <10 min with ambulation due to poor endurance. Pt reports her goal is to be able to use an AD to achieve mod-I mobility rather than relying on someone's HHA to ambulate.She was receiving outpatient PT until Feb. 2022 last year but ran out of visits. Patient has past medical history of Anoxic Brain Injury 09/2019, CAD, HLD, HTN, ISCHEMIC CARDIMYOPATHY, STEMI, V-FIB, ICD on 11/29/2020. Patient is currently living with mom. Mother reports she is abel to walk some with a walker with supervision but primarily ambulates with HHA of mother who  is her guardian. Mother also reports she requires assist for all transfers and ADL's.   PAIN:  Are you having pain? No  PRECAUTIONS: Fall  WEIGHT BEARING  RESTRICTIONS: No  FALLS: Has patient fallen in last 6 months? Yes. Number of falls 1  LIVING ENVIRONMENT: Lives with: lives with their family, mother Lives in: House/apartment Stairs: has stairs but stair lift installed, pt using lift Has following equipment at home: Environmental consultant - 2 wheeled, Wheelchair (manual), shower chair, and Grab bars, pt uses stair lift  PLOF: Needs assistance with ADLs  PATIENT GOALS: balance, walking, and pt states basketball  OBJECTIVE:  Note: Objective measures were completed at Evaluation unless otherwise noted.  DIAGNOSTIC FINDINGS:  None recent in chart  COGNITION: Overall cognitive status: impaired   SENSATION: Unable to complete testing due to impaired cognition, pt unable to report if sensation impairment present, pt mother unsure   EDEMA:  Mom reports pt's feet do swell from time to time   LOWER EXTREMITY MMT:  *accuracy of testing likely impacted by pt difficulty following cues  MMT Right Eval Left Eval  Hip flexion 4+ 4+  Hip extension    Hip abduction 4+ 4+  Hip adduction 4+ 4+  Hip internal rotation    Hip external rotation    Knee flexion 4+ 4+  Knee extension 5 5  Ankle dorsiflexion    Ankle plantarflexion    Ankle inversion    Ankle eversion    (Blank rows = not tested)  TRANSFERS: HHA  tried in session, very unsteady, pt has difficulty with motor planning, little success with cues due to distraction, very unsafe Attempted in session with RW, pt unsteady, gait ataxic, continued significant difficulty with motor planning requiring up to min a + 2 and multi-modal cues to safely sit in chair.   FUNCTIONAL TESTS:  5 times sit to stand: 1 min 43 sec Timed up and go (TUG): 1 min 30 sec with RW 10 meter walk test: 0.25 m/s with RW extensive cuing to complete  activity  PATIENT SURVEYS:  SIS-16 filled out by pt's mother, used as questions relevant to pt: 45                                                                                                                              TREATMENT DATE: 09/03/2023  Pt arrived to therapy session in transport chair.  Stroke Impact Scale 16 - completed by patient's mother (Copyrighted instrument, [employer])  In the past 2 weeks, how difficult was it to...  Rating Scale 5 = Not difficult at all 4 = A little difficult 3 = Somewhat difficult 2 = Very difficult 1 = Could not do at all  a. Dress the top part of your body? 2  b. Bathe yourself? 2  c. Get to the toilet on time? 4  d. Control your bladder (not have an accident)? 4  e. Control your bowels (not have an accident)? 5  f. Stand without losing balance? 3  g. Go shopping? 1  h. Do heavy household chores (e.g. vacuum, laundry or  yard work)?  1  i. Stay sitting without losing your balance? 5  j. Walk without losing your balance? 3  k. Move from a bed to a chair? 5  l. Walk fast? 2  m. Climb one flight of stairs? 2  n. Walk one block? 1  o. Get in and out of a car? 4  p. Carry heavy objects (e.g. bag of groceries) with your  affected hand? 2  Sum:  46/80 *completed by pt's mother  MDC (Minimal Detectable Change) is >/=8    Unless otherwise stated, CGA/min A was provided and gait belt donned in order to ensure pt safety throughout session.   R stand pivot transport chair>green chair with armrest support requiring min A for balance and pt again having difficulty turning fully to sit in chair.    Gait training 329ft using RW with CGA progressed to consistent skilled min assist for balance and AD management as she fatigues. As pt fatigues, she starts to push AD further forward in front of her requiring increased cuing to step into it and return to upright posture Pt continues to make last second adjustments to  avoid bumping obstacles with AD Progressed to adding dual-task challenge of visually scanning to identify post-it notes on wall with pt able to name post-it notes, but her AD management would deteriorate requiring increased cuing to correct Patient continues to have greatest challenge managing AD when turning, but especially so when stepping backwards to sit in chair, at end of gait   Gait training 150ft using RHHA with skilled min assist for balance due to increased imbalance without AD. Pt demos:  Wide BOS Short step lengths bilaterally with only slight step-through pattern B LEs slightly externally rotated With fatigue worsening B LE foot clearance Slower gait speed Minor postural sway in both/all directions   Dynamic gait training including forward/backwards gait ~53ft each direction x3 laps Requires + 2 B HHA for balance and pt sense of security  When attempting to have +2 release L HHA, pt immediately leans backwards towards R onto therapist and flexes knees in a fear response +2 min A for balance Pt demos more consistent reciprocal stepping pattern today compared to last time seen by this therapist; however, continues to have greater difficulty stepping back with R LE compared to L LE Patient will benefit from continuing this training with AD as well  Patient continues to remain easily distractible in clinic environment, requiring cuing to refocus attention on current task.     PATIENT EDUCATION: Education details: goal reassessment, indications, exercise technique Person educated: Patient and Parent Education method: Explanation, Demonstration, Tactile cues, and Verbal cues Education comprehension: returned demonstration, verbal cues required, tactile cues required, and needs further education  HOME EXERCISE PROGRAM: Access Code: [email or web address] URL: [email or web address] Date: 06/11/2023 Prepared by: Massie Dollar  Exercises - Standing March with Counter Support   - 1 x daily - 7 x weekly - 2 sets - 8 reps - Side Stepping with Counter Support  - 1 x daily - 7 x weekly - 2 sets - 4 reps - Sit to Stand with Armchair  - 1 x daily - 7 x weekly - 2 sets - 5 reps - Seated March  - 1 x daily - 7 x weekly - 3 sets - 10 reps  GOALS: Goals reviewed with patient? Yes   SHORT TERM GOALS: Target date: 07/11/2023  Patient will be independent in home exercise program to improve strength/mobility for better functional independence with ADLs. Baseline: Goal status:  INITIAL   LONG TERM GOALS: Target date: 11/13/2023   1.  Patient will complete five times sit to stand test in < 35 seconds indicating an increased LE strength and improved balance. Baseline: previously 37 sec with UUE 04/19/22 today 05/30/23: 1 min 43 seconds; 6/3: 41 sec with improved technique (use of BUE on armrests);  08/21/2023= 38 sec with some distractibility and VC for hand placement 7:23: 39.06 (41.23, 36.89 sec) Goal status: PARTIALLY MET  2. Patient will demo ability to ambulate > 400 ft w/ LRAD versus hand held assist min assist to demonstrate improved household mobility and short community distances.  Baseline: 4/28: 128ft with RW. Mod assist for AD management to prevent veer to the R. 6/3: ~ 300 ft with RW, requires cuing throughout for proximity to RW particularly with turns, but this has improved compared to eval;  08/21/2023= Patient ambulated approx 300 feet with RW- Sill some min assist at times to navigate walker and VC to remain close to walker.  7/23: 390ft with RW and min assist for safety and moderate instruction for safety AD intermittently, especially with fatigue  Goal status: IN PROGRESS  3.  Patient will increase 10 meter walk test to >1.68m/s as to improve gait speed for better community ambulation and to reduce fall risk. Baseline: previously 0.36 m/s on 04/19/22 : 0.25 m/s;  07/10/23: 0. 24 m/s with RW;  08/15/2023= 0.39 m/s  with RW yet max VC for RW navigation.  7/23:Average  Normal speed with RW: 0.25 m/s Average Normal speed with HHA: 0.61m/s Goal status: PROGRESSING  4.  Patient will reduce timed up and go (TUG) to <50 seconds to reduce fall risk and demonstrate improved transfer/gait ability Baseline: previously 91 sec with RW on 04/19/22; 05/30/23: 1 min 30 sec with RW 07/19/2023: 48.265 seconds using RW with skilled min A primarily for AD management with pt having difficulty turning with AD;  08/21/2023- 33 sec with HHA and 1 min 25 sec with RW (Max VC for safety with turning back to seat and to reach back safely)  7/23:42.18sec with RW and min assist for safety in turn; 40.35 sec with HHA and min assist for safety with turns.  Goal status: IN PROGRESS  5. Pt will exhibit correct and safe technique with stand>sit into a standard chair or transport chair to reduce risk of falling.   Baseline: pt current technique unsafe, poor motor planning; 6/3: pt with improved technique, bring RW closer to chair prior to sitting; 08/21/2023- Patient able to perform with CGA yet inconsistent with cues for safety and consistency with hand placement.   Goal status: PARTIALLY MET   6. The pt will demonstrate at least a 15 point improvement on SIS-16 to indicate increased ease with ADLs. Baseline: 45 (filled out by pt's mother); 42; 08/21/2023= 46; 09/03/2023: 46/80 Goal status: ONGOING  ASSESSMENT:  CLINICAL IMPRESSION:  Therapy session focused on gait training both with use of RW and with R HHA as patient's mother reports pt primarily uses HHA for functional mobility in life as stated above. Patient demos improving AD management; however, continues to make last second corrections to avoid bumping into obstacles, has increased difficulty with turns especially when going to sit in a chair, and requires min/mod cuing for improved AD management; however, this allows patient to walk with CGA/light min A progressing towards consistent min A when fatigued. Whereas when pt using R HHA to  ambulate, she requires constant min A for balance due to increased postural  sway and balance instability. Patient is challenged with sustained attention throughout sessions and the AD does require patient to divide her attention to this while ambulating. Patient participated in additional bout of forwards/backwards gait training with pt demonstrating significant improvement in backwards gait compared to last time seen by this thearpist; however, she does require + 2 B HHA throughout due to fear of falling without it. Patient will benefit from continued focus on dynamic balance and dynamic gait training as she primarily ambulates with HHA; otherwise, will need further AD training in household simulated environments. Pt will continue to benefit from skilled physical therapy intervention to address impairments, improve QOL, and attain therapy goals.       OBJECTIVE IMPAIRMENTS: Abnormal gait, decreased activity tolerance, decreased balance, decreased coordination, decreased endurance, decreased knowledge of use of DME, decreased mobility, difficulty walking, decreased strength, decreased safety awareness, increased edema, impaired tone, improper body mechanics, and postural dysfunction.   ACTIVITY LIMITATIONS: carrying, lifting, bending, standing, squatting, stairs, transfers, bathing, dressing, locomotion level, and caring for others  PARTICIPATION LIMITATIONS: meal prep, cleaning, laundry, medication management, personal finances, driving, shopping, community activity, occupation, and yard work  PERSONAL FACTORS: Sex, Time since onset of injury/illness/exacerbation, and 1-2 comorbidities: per chart PMH includes CAD, HTN, ischemic cardiomyopathy, STEMI, ventricular fibrillation, dysphagia, s/p ICD, apraxia  are also affecting patient's functional outcome.   REHAB POTENTIAL: Fair    CLINICAL DECISION MAKING: Evolving/moderate complexity  EVALUATION COMPLEXITY: High  PLAN:  PT FREQUENCY:  1-2x/week  PT DURATION: 12 weeks  PLANNED INTERVENTIONS: 97164- PT Re-evaluation, 97750- Physical Performance Testing, 97110-Therapeutic exercises, 97530- Therapeutic activity, 97112- Neuromuscular re-education, 97535- Self Care, [id number]- Manual therapy, [phone]- Gait training, [phone]- Orthotic Initial, [phone]- Orthotic/Prosthetic subsequent, [phone]- Canalith repositioning, Patient/Family education, Balance training, Stair training, Taping, Joint mobilization, Spinal mobilization, Vestibular training, DME instructions, Wheelchair mobility training, Cryotherapy, and Moist heat  PLAN FOR NEXT SESSION:  backwards gait training with skilled assistance when available with limited distractions  Transfer safety training Gait with least restrictive AD vs. HHA Dynamic balance     Kaedance Magos, PT, DPT, NCS, CSRS Physical Therapist - [facility]  [facility]  11:50 AM 09/03/23

## 2023-09-05 ENCOUNTER — Ambulatory Visit

## 2023-09-05 DIAGNOSIS — G931 Anoxic brain damage, not elsewhere classified: Secondary | ICD-10-CM

## 2023-09-05 DIAGNOSIS — M6281 Muscle weakness (generalized): Secondary | ICD-10-CM

## 2023-09-05 DIAGNOSIS — R41841 Cognitive communication deficit: Secondary | ICD-10-CM

## 2023-09-05 DIAGNOSIS — R262 Difficulty in walking, not elsewhere classified: Secondary | ICD-10-CM

## 2023-09-05 DIAGNOSIS — R278 Other lack of coordination: Secondary | ICD-10-CM

## 2023-09-05 DIAGNOSIS — R2689 Other abnormalities of gait and mobility: Secondary | ICD-10-CM

## 2023-09-05 DIAGNOSIS — R269 Unspecified abnormalities of gait and mobility: Secondary | ICD-10-CM

## 2023-09-05 DIAGNOSIS — R2681 Unsteadiness on feet: Secondary | ICD-10-CM

## 2023-09-05 NOTE — Therapy (Signed)
 OUTPATIENT PHYSICAL THERAPY TREATMENT    Patient Name: Beverly [name] MRN: [id number] DOB:October 08, 1985, 38 y.o., female Today's Date: 09/05/2023  PCP: [facility] REFERRING PROVIDER: Babs Arthea DASEN, MD  END OF SESSION:   PT End of Session - 09/05/23 1101     Visit Number 22    Number of Visits 41    Date for PT Re-Evaluation 11/13/23    Authorization Type UHC Dual Complete; Medicaid secondary    Authorization Time Period 05/30/23-08/22/23    Progress Note Due on Visit 30    PT Start Time 1101    PT Stop Time 1146    PT Time Calculation (min) 45 min    Equipment Utilized During Treatment Gait belt    Activity Tolerance Patient tolerated treatment well    Behavior During Therapy Flat affect;Impulsive;WFL for tasks assessed/performed            Past Medical History:  Diagnosis Date   Brain injury [facility])    CAD (coronary artery disease)    HLD (hyperlipidemia)    Hypertension    last pregnancy   Ischemic cardiomyopathy    STEMI (ST elevation myocardial infarction) (HCC) 09/2019   SCAD with aneurysmal dilation of proximal LAD.   Vaginal Pap smear, abnormal    when she was 38yo   Ventricular fibrillation (HCC) 09/23/2019   Past Surgical History:  Procedure Laterality Date   CARDIAC CATHETERIZATION     IR GASTROSTOMY TUBE MOD SED  10/07/2019   IR GASTROSTOMY TUBE REMOVAL  07/02/2020   IR REPLC GASTRO/COLONIC TUBE PERCUT W/FLUORO  11/03/2021   IR REPLC GASTRO/COLONIC TUBE PERCUT W/FLUORO  03/09/2023   LEFT HEART CATH AND CORONARY ANGIOGRAPHY N/A 09/23/2019   Procedure: LEFT HEART CATH AND CORONARY ANGIOGRAPHY;  Surgeon: Beverly [name], MD;  Location: [facility] INVASIVE CV LAB;  Service: Cardiovascular;  Laterality: N/A;   SUBQ ICD IMPLANT N/A 11/26/2020   Procedure: SUBQ ICD IMPLANT;  Surgeon: Beverly Ole DASEN, MD;  Location: [facility] INVASIVE CV LAB;  Service: Cardiovascular;  Laterality: N/A;   Patient Active Problem List   Diagnosis Date Noted    Apraxia 10/05/2021   S/P ICD (internal cardiac defibrillator) procedure 11/27/2020   Hx of Cardiac arrest (HCC) 11/26/2020   Abnormality of gait 03/16/2020   Sleep disturbance 03/16/2020   Oropharyngeal dysphagia 02/12/2020   Coronary artery disease involving native coronary artery of native heart without angina pectoris 12/12/2019   Ischemic cardiomyopathy 12/12/2019   Brain injury (HCC)    Prediabetes    Anoxic brain injury (HCC) 11/05/2019   Dysphagia 11/05/2019   Physical deconditioning 11/05/2019   Acute delirium 11/05/2019   Respiratory failure (HCC)    Encounter for central line placement    Ventricular fibrillation (HCC) 09/23/2019   Acute combined systolic and diastolic heart failure (HCC) 09/23/2019   Ventricular tachycardia, polymorphic (HCC) 09/23/2019   Pelvic pain affecting pregnancy 10/15/2015   Supervision of normal pregnancy in third trimester 09/28/2015   Poor weight gain of pregnancy 09/28/2015   Iron  deficiency anemia of pregnancy 09/01/2015   Increased BMI (body mass index) 07/29/2015    ONSET DATE: August 2021  REFERRING DIAG:  G93.1 (ICD-10-CM) - Anoxic brain injury (HCC)  R48.2 (ICD-10-CM) - Apraxia    THERAPY DIAG:   Muscle weakness (generalized)  Other lack of coordination  Anoxic brain injury (HCC)  Unsteadiness on feet  Difficulty in walking, not elsewhere classified  Other abnormalities of gait and mobility  Abnormality of gait and mobility  Cognitive communication  deficit  Rationale for Evaluation and Treatment: Rehabilitation  SUBJECTIVE:                                                                                                                                                                                             SUBJECTIVE STATEMENT:    Patient nods head when asked if she was okay.   Pt reports she is doing fine. Denies pain. Denies stumbles/falls. [name] states she sometimes gives patient the RW to walk in  the living room, but otherwise since it is such short distances in the house, she primarily provides patient HHA. [name] reports patient primarily goes in the community via dependent manual wheelchair propulsion if going long distances vs HHA in familiar and short distance community environments. However, states when they go to family events she provides pt with HHA and pt will ambulate. Mother states she varies which HHA she provides based on patient's stability that day.   [name] reports she is still trying to have patient perform HEP at a bar counter for support, but some days are more successful than others.   Pt accompanied by: self, mother- Beverly [name]  PERTINENT HISTORY:   Patient is a 38 year old with diagnois of anoxic brain injury. Pt known to PT clinic. Previously seen for outpatient services March 2024. Pt was receiving HH PT until October 2024 per her mother's report. [name] reports pt using HHA for ambulation, was using RW but she says was told not to do to difficulty with safe technique. Pt has fallen 1x in the past six months trying to ambulate on her own. Pt requires assistance with ADLs, including bathing and dressing. [name] reports pt never indicates any pain. Pt with severe aphasia so is unable to answer questions/pt's mom provides hx. She reports pt has impaired short-term memory, difficulty with stair climbing (has stair lift at home). She reports pt's BLE are weaker than they used to be. The pt is probably limited to <10 min with ambulation due to poor endurance. Pt reports her goal is to be able to use an AD to achieve mod-I mobility rather than relying on someone's HHA to ambulate.She was receiving outpatient PT until Feb. 2022 last year but ran out of visits. Patient has past medical history of Anoxic Brain Injury 09/2019, CAD, HLD, HTN, ISCHEMIC CARDIMYOPATHY, STEMI, V-FIB, ICD on 11/29/2020. Patient is currently living with mom. Mother reports she is abel to walk  some with a walker with supervision but primarily ambulates with HHA of mother who is her guardian. Mother also reports she requires assist for all transfers and  ADL's.   PAIN:  Are you having pain? No  PRECAUTIONS: Fall  WEIGHT BEARING RESTRICTIONS: No  FALLS: Has patient fallen in last 6 months? Yes. Number of falls 1  LIVING ENVIRONMENT: Lives with: lives with their family, mother Lives in: House/apartment Stairs: has stairs but stair lift installed, pt using lift Has following equipment at home: Environmental consultant - 2 wheeled, Wheelchair (manual), shower chair, and Grab bars, pt uses stair lift  PLOF: Needs assistance with ADLs  PATIENT GOALS: balance, walking, and pt states basketball  OBJECTIVE:  Note: Objective measures were completed at Evaluation unless otherwise noted.  DIAGNOSTIC FINDINGS:  None recent in chart  COGNITION: Overall cognitive status: impaired   SENSATION: Unable to complete testing due to impaired cognition, pt unable to report if sensation impairment present, pt mother unsure   EDEMA:  Mom reports pt's feet do swell from time to time   LOWER EXTREMITY MMT:  *accuracy of testing likely impacted by pt difficulty following cues  MMT Right Eval Left Eval  Hip flexion 4+ 4+  Hip extension    Hip abduction 4+ 4+  Hip adduction 4+ 4+  Hip internal rotation    Hip external rotation    Knee flexion 4+ 4+  Knee extension 5 5  Ankle dorsiflexion    Ankle plantarflexion    Ankle inversion    Ankle eversion    (Blank rows = not tested)  TRANSFERS: HHA  tried in session, very unsteady, pt has difficulty with motor planning, little success with cues due to distraction, very unsafe Attempted in session with RW, pt unsteady, gait ataxic, continued significant difficulty with motor planning requiring up to min a + 2 and multi-modal cues to safely sit in chair.   FUNCTIONAL TESTS:  5 times sit to stand: 1 min 43 sec Timed up and go (TUG): 1 min 30 sec with  RW 10 meter walk test: 0.25 m/s with RW extensive cuing to complete activity  PATIENT SURVEYS:  SIS-16 filled out by pt's mother, used as questions relevant to pt: 45                                                                                                                              TREATMENT DATE: 09/05/2023  Pt arrived to therapy session in transport chair.  Stroke Impact Scale 16 - completed by patient's mother (Copyrighted instrument, [employer])  In the past 2 weeks, how difficult was it to...  Rating Scale 5 = Not difficult at all 4 = A little difficult 3 = Somewhat difficult 2 = Very difficult 1 = Could not do at all  a. Dress the top part of your body? 2  b. Bathe yourself? 2  c. Get to the toilet on time? 4  d. Control your bladder (not have an accident)? 4  e. Control your bowels (not have an accident)? 5  f. Stand without losing balance? 3  g.  Go shopping? 1  h. Do heavy household chores (e.g. vacuum, laundry or  yard work)? 1  i. Stay sitting without losing your balance? 5  j. Walk without losing your balance? 3  k. Move from a bed to a chair? 5  l. Walk fast? 2  m. Climb one flight of stairs? 2  n. Walk one block? 1  o. Get in and out of a car? 4  p. Carry heavy objects (e.g. bag of groceries) with your  affected hand? 2  Sum:  46/80 *completed by pt's mother  MDC (Minimal Detectable Change) is >/=8    Unless otherwise stated, CGA/min A was provided and gait belt donned in order to ensure pt safety throughout session.   R stand pivot transport chair>green chair with armrest support requiring min A for balance and pt again having difficulty turning fully to sit in chair.    Gait training 150 ft x 2 trips using RW with CGA progressed to consistent skilled min assist for balance and AD management as she fatigues.  - tendency to veer walker to right requiring min A to keep walker facing forward Continues to push walker too  far in front of her requiring increased cuing to step into it and return to upright posture. VC to slow down Progressed to adding dual-task challenge of visually scanning to identify post-it notes on wall with pt able to name post-it notes, but her AD management would deteriorate requiring increased cuing to correct Patient continues to have greatest challenge managing AD when turning, but especially so when stepping backwards to sit in chair, at end of gait Much improved with less physical assist on 2nd trial- patient still veered to right but able to self correct better if given time and able to stay within walker better on 2nd trip.      Dynamic gait training including forward/backwards gait ~6 ft each direction x 8 Max Assist with 1  one HHA assist and patient easily distracted and appeared fearul.  On last 2 trials+ 1 B HHA for balance and pt sense of security   +2 (Second person for safety - close supervision)  2 more trials using RW- Min assist for navigation with overall improved compliance with stepping backward  l  Patient continues to remain easily distractible in clinic environment, requiring cuing to refocus attention on current task.     PATIENT EDUCATION: Education details: goal reassessment, indications, exercise technique Person educated: Patient and Parent Education method: Explanation, Demonstration, Tactile cues, and Verbal cues Education comprehension: returned demonstration, verbal cues required, tactile cues required, and needs further education  HOME EXERCISE PROGRAM: Access Code: [email or web address] URL: [email or web address] Date: 06/11/2023 Prepared by: Massie Dollar  Exercises - Standing March with Counter Support  - 1 x daily - 7 x weekly - 2 sets - 8 reps - Side Stepping with Counter Support  - 1 x daily - 7 x weekly - 2 sets - 4 reps - Sit to Stand with Armchair  - 1 x daily - 7 x weekly - 2 sets - 5 reps - Seated March  - 1 x daily - 7 x weekly  - 3 sets - 10 reps  GOALS: Goals reviewed with patient? Yes   SHORT TERM GOALS: Target date: 07/11/2023  Patient will be independent in home exercise program to improve strength/mobility for better functional independence with ADLs. Baseline: Goal status: INITIAL   LONG TERM GOALS: Target date: 11/13/2023   1.  Patient will complete five times  sit to stand test in < 35 seconds indicating an increased LE strength and improved balance. Baseline: previously 37 sec with UUE 04/19/22 today 05/30/23: 1 min 43 seconds; 6/3: 41 sec with improved technique (use of BUE on armrests);  08/21/2023= 38 sec with some distractibility and VC for hand placement 7:23: 39.06 (41.23, 36.89 sec) Goal status: PARTIALLY MET  2. Patient will demo ability to ambulate > 400 ft w/ LRAD versus hand held assist min assist to demonstrate improved household mobility and short community distances.  Baseline: 4/28: 134ft with RW. Mod assist for AD management to prevent veer to the R. 6/3: ~ 300 ft with RW, requires cuing throughout for proximity to RW particularly with turns, but this has improved compared to eval;  08/21/2023= Patient ambulated approx 300 feet with RW- Sill some min assist at times to navigate walker and VC to remain close to walker.  7/23: 355ft with RW and min assist for safety and moderate instruction for safety AD intermittently, especially with fatigue  Goal status: IN PROGRESS  3.  Patient will increase 10 meter walk test to >1.56m/s as to improve gait speed for better community ambulation and to reduce fall risk. Baseline: previously 0.36 m/s on 04/19/22 : 0.25 m/s;  07/10/23: 0. 24 m/s with RW;  08/15/2023= 0.39 m/s  with RW yet max VC for RW navigation.  7/23:Average Normal speed with RW: 0.25 m/s Average Normal speed with HHA: 0.89m/s Goal status: PROGRESSING  4.  Patient will reduce timed up and go (TUG) to <50 seconds to reduce fall risk and demonstrate improved transfer/gait ability Baseline:  previously 91 sec with RW on 04/19/22; 05/30/23: 1 min 30 sec with RW 07/19/2023: 48.265 seconds using RW with skilled min A primarily for AD management with pt having difficulty turning with AD;  08/21/2023- 33 sec with HHA and 1 min 25 sec with RW (Max VC for safety with turning back to seat and to reach back safely)  7/23:42.18sec with RW and min assist for safety in turn; 40.35 sec with HHA and min assist for safety with turns.  Goal status: IN PROGRESS  5. Pt will exhibit correct and safe technique with stand>sit into a standard chair or transport chair to reduce risk of falling.   Baseline: pt current technique unsafe, poor motor planning; 6/3: pt with improved technique, bring RW closer to chair prior to sitting; 08/21/2023- Patient able to perform with CGA yet inconsistent with cues for safety and consistency with hand placement.   Goal status: PARTIALLY MET   6. The pt will demonstrate at least a 15 point improvement on SIS-16 to indicate increased ease with ADLs. Baseline: 45 (filled out by pt's mother); 42; 08/21/2023= 46; 09/03/2023: 46/80 Goal status: ONGOING  ASSESSMENT:  CLINICAL IMPRESSION:  Treatment continued with gait training with use of RW to try to improve safety and consistency with use of device. She continues to struggle due to distractibility although she was able to avoid obstacles today if provided time for most part- still mainly min assist to keep her walking straight- tendency to veer to right and less overall cues on 2nd walk to stay within walker. She later demonstrated most difficulty with backwards walking-fearful and  requiring max VC and ultimately 2 hha for max safety.  Patient will benefit from continued focus on dynamic balance and dynamic gait training as she primarily ambulates with HHA; otherwise, will need further AD training in household simulated environments. Pt will continue to benefit from skilled physical  therapy intervention to address impairments, improve  QOL, and attain therapy goals.       OBJECTIVE IMPAIRMENTS: Abnormal gait, decreased activity tolerance, decreased balance, decreased coordination, decreased endurance, decreased knowledge of use of DME, decreased mobility, difficulty walking, decreased strength, decreased safety awareness, increased edema, impaired tone, improper body mechanics, and postural dysfunction.   ACTIVITY LIMITATIONS: carrying, lifting, bending, standing, squatting, stairs, transfers, bathing, dressing, locomotion level, and caring for others  PARTICIPATION LIMITATIONS: meal prep, cleaning, laundry, medication management, personal finances, driving, shopping, community activity, occupation, and yard work  PERSONAL FACTORS: Sex, Time since onset of injury/illness/exacerbation, and 1-2 comorbidities: per chart PMH includes CAD, HTN, ischemic cardiomyopathy, STEMI, ventricular fibrillation, dysphagia, s/p ICD, apraxia  are also affecting patient's functional outcome.   REHAB POTENTIAL: Fair    CLINICAL DECISION MAKING: Evolving/moderate complexity  EVALUATION COMPLEXITY: High  PLAN:  PT FREQUENCY: 1-2x/week  PT DURATION: 12 weeks  PLANNED INTERVENTIONS: 97164- PT Re-evaluation, 97750- Physical Performance Testing, 97110-Therapeutic exercises, 97530- Therapeutic activity, 97112- Neuromuscular re-education, 97535- Self Care, [id number]- Manual therapy, [phone]- Gait training, [phone]- Orthotic Initial, [phone]- Orthotic/Prosthetic subsequent, [phone]- Canalith repositioning, Patient/Family education, Balance training, Stair training, Taping, Joint mobilization, Spinal mobilization, Vestibular training, DME instructions, Wheelchair mobility training, Cryotherapy, and Moist heat  PLAN FOR NEXT SESSION:  backwards gait training with skilled assistance when available with limited distractions  Transfer safety training Gait with least restrictive AD vs. HHA Dynamic balance    Chyrl London, PT Physical Therapist - [facility]  [facility]  [facility]  11:57 AM 09/05/23

## 2023-09-10 ENCOUNTER — Ambulatory Visit: Admitting: Occupational Therapy

## 2023-09-10 ENCOUNTER — Ambulatory Visit

## 2023-09-12 ENCOUNTER — Ambulatory Visit: Admitting: Physical Therapy

## 2023-09-13 ENCOUNTER — Ambulatory Visit (INDEPENDENT_AMBULATORY_CARE_PROVIDER_SITE_OTHER): Payer: Medicaid Other

## 2023-09-13 DIAGNOSIS — I255 Ischemic cardiomyopathy: Secondary | ICD-10-CM

## 2023-09-14 LAB — CUP PACEART REMOTE DEVICE CHECK
Battery Remaining Percentage: 64 %
Date Time Interrogation Session: 20250807121100
HighPow Impedance: 150 Ohm
Implantable Pulse Generator Implant Date: 20221021
Pulse Gen Serial Number: 161805

## 2023-09-17 ENCOUNTER — Ambulatory Visit: Admitting: Physical Therapy

## 2023-09-17 ENCOUNTER — Ambulatory Visit: Payer: Self-pay | Admitting: Cardiology

## 2023-09-19 ENCOUNTER — Ambulatory Visit: Admitting: Physical Therapy

## 2023-09-19 ENCOUNTER — Ambulatory Visit: Admitting: Physical Medicine & Rehabilitation

## 2023-09-21 NOTE — Progress Notes (Signed)
 Patient for IR G-tube exchange on Mon 09/24/23, I called and spoke with the patient's mother, Ms Janice on the phone and gave pre-procedure instructions. Ms Janice was made aware to have the patient here at 1p and check in at the new entrance. Ms Janice stated understanding.  Called 09/10/23 and LVM again on 09/21/23

## 2023-09-24 ENCOUNTER — Ambulatory Visit: Attending: Physical Medicine & Rehabilitation

## 2023-09-24 ENCOUNTER — Ambulatory Visit: Admitting: Occupational Therapy

## 2023-09-24 ENCOUNTER — Ambulatory Visit
Admission: RE | Admit: 2023-09-24 | Discharge: 2023-09-24 | Disposition: A | Discharge status: Home / Self Care | Source: Ambulatory Visit | Attending: Interventional Radiology | Admitting: Interventional Radiology

## 2023-09-26 ENCOUNTER — Encounter: Attending: Physical Medicine & Rehabilitation | Admitting: Physical Medicine & Rehabilitation

## 2023-09-26 ENCOUNTER — Ambulatory Visit: Attending: Physical Medicine & Rehabilitation | Admitting: Physical Therapy

## 2023-09-26 ENCOUNTER — Encounter: Payer: Self-pay | Admitting: Physical Medicine & Rehabilitation

## 2023-09-26 VITALS — BP 101/68 | HR 92 | Ht 69.0 in | Wt 192.0 lb

## 2023-09-26 DIAGNOSIS — R2681 Unsteadiness on feet: Secondary | ICD-10-CM | POA: Diagnosis present

## 2023-09-26 DIAGNOSIS — R278 Other lack of coordination: Secondary | ICD-10-CM | POA: Insufficient documentation

## 2023-09-26 DIAGNOSIS — R41841 Cognitive communication deficit: Secondary | ICD-10-CM | POA: Insufficient documentation

## 2023-09-26 DIAGNOSIS — R269 Unspecified abnormalities of gait and mobility: Secondary | ICD-10-CM | POA: Insufficient documentation

## 2023-09-26 DIAGNOSIS — R2689 Other abnormalities of gait and mobility: Secondary | ICD-10-CM | POA: Diagnosis present

## 2023-09-26 DIAGNOSIS — R1312 Dysphagia, oropharyngeal phase: Secondary | ICD-10-CM | POA: Diagnosis not present

## 2023-09-26 DIAGNOSIS — G479 Sleep disorder, unspecified: Secondary | ICD-10-CM | POA: Diagnosis not present

## 2023-09-26 DIAGNOSIS — R262 Difficulty in walking, not elsewhere classified: Secondary | ICD-10-CM | POA: Insufficient documentation

## 2023-09-26 DIAGNOSIS — G931 Anoxic brain damage, not elsewhere classified: Secondary | ICD-10-CM | POA: Insufficient documentation

## 2023-09-26 DIAGNOSIS — M6281 Muscle weakness (generalized): Secondary | ICD-10-CM | POA: Insufficient documentation

## 2023-09-26 MED ORDER — TRAZODONE HCL 100 MG PO TABS: 50.0000 mg | ORAL_TABLET | Freq: Every day | ORAL | 5 refills | Status: DC

## 2023-09-26 MED ORDER — CARBIDOPA-LEVODOPA 25-100 MG PO TABS: 1.0000 | ORAL_TABLET | Freq: Four times a day (QID) | ORAL | 1 refills | Status: DC

## 2023-09-26 MED ORDER — TRAZODONE HCL 100 MG PO TABS: 100.0000 mg | ORAL_TABLET | Freq: Every day | ORAL | 5 refills | Status: DC

## 2023-09-26 NOTE — Therapy (Signed)
 OUTPATIENT PHYSICAL THERAPY TREATMENT    Patient Name: Beverly [name] MRN: [id number] DOB:04-26-1985, 38 y.o., female Today's Date: 09/26/2023  PCP: [facility] REFERRING PROVIDER: Babs Arthea DASEN, MD  END OF SESSION:   PT End of Session - 09/26/23 1113     Visit Number 23    Number of Visits 41    Date for PT Re-Evaluation 11/13/23    Authorization Type UHC Dual Complete; Medicaid secondary    Authorization Time Period 05/30/23-08/22/23    Progress Note Due on Visit 30    PT Start Time 1111    PT Stop Time 1150    PT Time Calculation (min) 39 min    Equipment Utilized During Treatment Gait belt    Activity Tolerance Patient tolerated treatment well    Behavior During Therapy Flat affect;Impulsive;WFL for tasks assessed/performed             Past Medical History:  Diagnosis Date   Brain injury [facility])    CAD (coronary artery disease)    HLD (hyperlipidemia)    Hypertension    last pregnancy   Ischemic cardiomyopathy    STEMI (ST elevation myocardial infarction) (HCC) 09/2019   SCAD with aneurysmal dilation of proximal LAD.   Vaginal Pap smear, abnormal    when she was 38yo   Ventricular fibrillation (HCC) 09/23/2019   Past Surgical History:  Procedure Laterality Date   CARDIAC CATHETERIZATION     IR GASTROSTOMY TUBE MOD SED  10/07/2019   IR GASTROSTOMY TUBE REMOVAL  07/02/2020   IR REPLC GASTRO/COLONIC TUBE PERCUT W/FLUORO  11/03/2021   IR REPLC GASTRO/COLONIC TUBE PERCUT W/FLUORO  03/09/2023   LEFT HEART CATH AND CORONARY ANGIOGRAPHY N/A 09/23/2019   Procedure: LEFT HEART CATH AND CORONARY ANGIOGRAPHY;  Surgeon: Mady [name], MD;  Location: [facility] INVASIVE CV LAB;  Service: Cardiovascular;  Laterality: N/A;   SUBQ ICD IMPLANT N/A 11/26/2020   Procedure: SUBQ ICD IMPLANT;  Surgeon: [name], MD;  Location: [facility] INVASIVE CV LAB;  Service: Cardiovascular;  Laterality: N/A;   Patient Active Problem List   Diagnosis Date Noted    Apraxia 10/05/2021   S/P ICD (internal cardiac defibrillator) procedure 11/27/2020   Hx of Cardiac arrest (HCC) 11/26/2020   Abnormality of gait 03/16/2020   Sleep disturbance 03/16/2020   Oropharyngeal dysphagia 02/12/2020   Coronary artery disease involving native coronary artery of native heart without angina pectoris 12/12/2019   Ischemic cardiomyopathy 12/12/2019   Brain injury (HCC)    Prediabetes    Anoxic brain injury (HCC) 11/05/2019   Dysphagia 11/05/2019   Physical deconditioning 11/05/2019   Acute delirium 11/05/2019   Respiratory failure (HCC)    Encounter for central line placement    Ventricular fibrillation (HCC) 09/23/2019   Acute combined systolic and diastolic heart failure (HCC) 09/23/2019   Ventricular tachycardia, polymorphic (HCC) 09/23/2019   Pelvic pain affecting pregnancy 10/15/2015   Supervision of normal pregnancy in third trimester 09/28/2015   Poor weight gain of pregnancy 09/28/2015   Iron  deficiency anemia of pregnancy 09/01/2015   Increased BMI (body mass index) 07/29/2015    ONSET DATE: August 2021  REFERRING DIAG:  G93.1 (ICD-10-CM) - Anoxic brain injury (HCC)  R48.2 (ICD-10-CM) - Apraxia    THERAPY DIAG:   Muscle weakness (generalized)  Other lack of coordination  Anoxic brain injury (HCC)  Unsteadiness on feet  Difficulty in walking, not elsewhere classified  Other abnormalities of gait and mobility  Abnormality of gait and mobility  Rationale  for Evaluation and Treatment: Rehabilitation  SUBJECTIVE:                                                                                                                                                                                             SUBJECTIVE STATEMENT:   Pt nods and states that she is doing OK. [name] father with her today and unsure why patient has not been to therapy the past 3 weeks. Patient's father reports he is unaware of any additional information/updates at this  time.  Pt accompanied by: self, father GLENWOOD Barefoot  PERTINENT HISTORY:   Patient is a 38 year old with diagnois of anoxic brain injury. Pt known to PT clinic. Previously seen for outpatient services March 2024. Pt was receiving HH PT until October 2024 per her mother's report. [name] reports pt using HHA for ambulation, was using RW but she says was told not to do to difficulty with safe technique. Pt has fallen 1x in the past six months trying to ambulate on her own. Pt requires assistance with ADLs, including bathing and dressing. [name] reports pt never indicates any pain. Pt with severe aphasia so is unable to answer questions/pt's mom provides hx. She reports pt has impaired short-term memory, difficulty with stair climbing (has stair lift at home). She reports pt's BLE are weaker than they used to be. The pt is probably limited to <10 min with ambulation due to poor endurance. Pt reports her goal is to be able to use an AD to achieve mod-I mobility rather than relying on someone's HHA to ambulate.She was receiving outpatient PT until Feb. 2022 last year but ran out of visits. Patient has past medical history of Anoxic Brain Injury 09/2019, CAD, HLD, HTN, ISCHEMIC CARDIMYOPATHY, STEMI, V-FIB, ICD on 11/29/2020. Patient is currently living with mom. Mother reports she is abel to walk some with a walker with supervision but primarily ambulates with HHA of mother who is her guardian. Mother also reports she requires assist for all transfers and ADL's.   PAIN:  Are you having pain? No  PRECAUTIONS: Fall  WEIGHT BEARING RESTRICTIONS: No  FALLS: Has patient fallen in last 6 months? Yes. Number of falls 1  LIVING ENVIRONMENT: Lives with: lives with their family, mother Lives in: House/apartment Stairs: has stairs but stair lift installed, pt using lift Has following equipment at home: Environmental consultant - 2 wheeled, Wheelchair (manual), shower chair, and Grab bars, pt uses stair lift  PLOF: Needs  assistance with ADLs  PATIENT GOALS: balance, walking, and pt states basketball  OBJECTIVE:  Note: Objective measures were completed at Evaluation unless otherwise  noted.  DIAGNOSTIC FINDINGS:  None recent in chart  COGNITION: Overall cognitive status: impaired   SENSATION: Unable to complete testing due to impaired cognition, pt unable to report if sensation impairment present, pt mother unsure   EDEMA:  Mom reports pt's feet do swell from time to time   LOWER EXTREMITY MMT:  *accuracy of testing likely impacted by pt difficulty following cues  MMT Right Eval Left Eval  Hip flexion 4+ 4+  Hip extension    Hip abduction 4+ 4+  Hip adduction 4+ 4+  Hip internal rotation    Hip external rotation    Knee flexion 4+ 4+  Knee extension 5 5  Ankle dorsiflexion    Ankle plantarflexion    Ankle inversion    Ankle eversion    (Blank rows = not tested)  TRANSFERS: HHA  tried in session, very unsteady, pt has difficulty with motor planning, little success with cues due to distraction, very unsafe Attempted in session with RW, pt unsteady, gait ataxic, continued significant difficulty with motor planning requiring up to min a + 2 and multi-modal cues to safely sit in chair.   FUNCTIONAL TESTS:  5 times sit to stand: 1 min 43 sec Timed up and go (TUG): 1 min 30 sec with RW 10 meter walk test: 0.25 m/s with RW extensive cuing to complete activity  PATIENT SURVEYS:  SIS-16 filled out by pt's mother, used as questions relevant to pt: 45                                                                                                                              TREATMENT DATE: 09/26/2023  Pt arrived to therapy session in transport chair.  Unless otherwise stated, CGA/min A was provided and gait belt donned in order to ensure pt safety throughout session.  R stand pivot transport chair>green chair with L HHA and armrest support requiring light min A for balance and pt improving  in ability to turn and sit without AD.   Gait training 300 ft using RW with CGA progressed to more consistent skilled min assist for balance and AD management as she fatigues. Still has tendency to veer walker to right, but primarily right before she is about to turn R Improved AD management with decreased tendency to push walker too far in front of her, until the end as she fatigues requiring more frequent cuing to step into the walker  Pt maintains slow gait speed with min reciprocal steps Improved base of support with pt no longer kicking back leg of RW with L LE  Progressed to adding dual-task challenge of visually scanning to identify post-it notes on wall with pt able to name post-it notes, but her AD management would deteriorate requiring increased cuing to correct; however, improved today after 1x cuing compared to last session This does result in even slower gait speed in order to successfully dual-task Patient continues to have greatest challenge managing  AD when turning, but especially so when stepping backwards to sit in chair, at end of gait Overall demonstrates significant improvement in gait and AD management compared to last time seen by this therapist!   Block practice short distance ~33ft ambulatory stand pivot transfers using RW When doing 90degree turns, pt has improved AD management; although still impaired with tendency to push AD too far forward when stepping backwards (likely a fear response), but this is even more severe during 180degree turns  Patient performing this in a busy environment so transitioned to hallway to decrease distractions.  Moved into hallway and practiced forward/backwards ~11ft gait training using RW - focusing on performing 1 step with each LE and then bringing AD backwards Requires +2 A for safety (2nd person providing min A) Skilled mod A on L side for balance and heavy assist to bring AD back to her after each step Pt continues to have greater  difficulty stepping R LE back Pt continues to have tendency to initiate posterior hip weight shift and push AD forward with arms extended when she gets close to a chair rather than stepping back fully  Patient continues to remain easily distractible in clinic environment, requiring cuing to refocus attention on current task.   PATIENT EDUCATION: Education details: goal reassessment, indications, exercise technique Person educated: Patient and Parent Education method: Explanation, Demonstration, Tactile cues, and Verbal cues Education comprehension: returned demonstration, verbal cues required, tactile cues required, and needs further education  HOME EXERCISE PROGRAM: Access Code: [email or web address] URL: [email or web address] Date: 06/11/2023 Prepared by: Massie Dollar  Exercises - Standing March with Counter Support  - 1 x daily - 7 x weekly - 2 sets - 8 reps - Side Stepping with Counter Support  - 1 x daily - 7 x weekly - 2 sets - 4 reps - Sit to Stand with Armchair  - 1 x daily - 7 x weekly - 2 sets - 5 reps - Seated March  - 1 x daily - 7 x weekly - 3 sets - 10 reps  GOALS: Goals reviewed with patient? Yes   SHORT TERM GOALS: Target date: 07/11/2023  Patient will be independent in home exercise program to improve strength/mobility for better functional independence with ADLs. Baseline: Goal status: INITIAL   LONG TERM GOALS: Target date: 11/13/2023   1.  Patient will complete five times sit to stand test in < 35 seconds indicating an increased LE strength and improved balance. Baseline: previously 37 sec with UUE 04/19/22 today 05/30/23: 1 min 43 seconds; 6/3: 41 sec with improved technique (use of BUE on armrests);  08/21/2023= 38 sec with some distractibility and VC for hand placement 7:23: 39.06 (41.23, 36.89 sec) Goal status: PARTIALLY MET  2. Patient will demo ability to ambulate > 400 ft w/ LRAD versus hand held assist min assist to demonstrate improved household  mobility and short community distances.  Baseline: 4/28: 156ft with RW. Mod assist for AD management to prevent veer to the R. 6/3: ~ 300 ft with RW, requires cuing throughout for proximity to RW particularly with turns, but this has improved compared to eval;  08/21/2023= Patient ambulated approx 300 feet with RW- Sill some min assist at times to navigate walker and VC to remain close to walker.  7/23: 320ft with RW and min assist for safety and moderate instruction for safety AD intermittently, especially with fatigue  Goal status: IN PROGRESS  3.  Patient will increase 10 meter walk test to >1.51m/s as to improve gait  speed for better community ambulation and to reduce fall risk. Baseline: previously 0.36 m/s on 04/19/22 : 0.25 m/s;  07/10/23: 0. 24 m/s with RW;  08/15/2023= 0.39 m/s  with RW yet max VC for RW navigation.  7/23:Average Normal speed with RW: 0.25 m/s Average Normal speed with HHA: 0.19m/s Goal status: PROGRESSING  4.  Patient will reduce timed up and go (TUG) to <50 seconds to reduce fall risk and demonstrate improved transfer/gait ability Baseline: previously 91 sec with RW on 04/19/22; 05/30/23: 1 min 30 sec with RW 07/19/2023: 48.265 seconds using RW with skilled min A primarily for AD management with pt having difficulty turning with AD;  08/21/2023- 33 sec with HHA and 1 min 25 sec with RW (Max VC for safety with turning back to seat and to reach back safely)  7/23:42.18sec with RW and min assist for safety in turn; 40.35 sec with HHA and min assist for safety with turns.  Goal status: IN PROGRESS  5. Pt will exhibit correct and safe technique with stand>sit into a standard chair or transport chair to reduce risk of falling.   Baseline: pt current technique unsafe, poor motor planning; 6/3: pt with improved technique, bring RW closer to chair prior to sitting; 08/21/2023- Patient able to perform with CGA yet inconsistent with cues for safety and consistency with hand placement.    Goal status: PARTIALLY MET   6. The pt will demonstrate at least a 15 point improvement on SIS-16 to indicate increased ease with ADLs. Baseline: 45 (filled out by pt's mother); 42; 08/21/2023= 46; 09/03/2023: 46/80 Goal status: ONGOING  ASSESSMENT:  CLINICAL IMPRESSION:  Treatment continued with gait training with use of RW to try to improve safety and consistency with use of device. Patient demonstrates significant improvement in RW management resulting in decreased need for cuing, until she becomes fatigued. Patient also demonstrates decreased tendency to veer AD towards the R, unless she is about to initiate a turn, and is no longer kicking back L leg of RW with her L foot due to improved balance and pt no longer feeling she needs as wide of a BOS. Patient continues to demonstrate greatest deficit when turning to sit in chair at end of gait with it appearing patient continues to have increased fear of falling when stepping posteriorly. Therefore, focused on forward/backwards gait training with RW to improve confidence and motor plan sequencing of this task. Patient will benefit from continuation of this training in future sessions. Pt will continue to benefit from skilled physical therapy intervention to address impairments, improve QOL, and attain therapy goals.       OBJECTIVE IMPAIRMENTS: Abnormal gait, decreased activity tolerance, decreased balance, decreased coordination, decreased endurance, decreased knowledge of use of DME, decreased mobility, difficulty walking, decreased strength, decreased safety awareness, increased edema, impaired tone, improper body mechanics, and postural dysfunction.   ACTIVITY LIMITATIONS: carrying, lifting, bending, standing, squatting, stairs, transfers, bathing, dressing, locomotion level, and caring for others  PARTICIPATION LIMITATIONS: meal prep, cleaning, laundry, medication management, personal finances, driving, shopping, community activity,  occupation, and yard work  PERSONAL FACTORS: Sex, Time since onset of injury/illness/exacerbation, and 1-2 comorbidities: per chart PMH includes CAD, HTN, ischemic cardiomyopathy, STEMI, ventricular fibrillation, dysphagia, s/p ICD, apraxia  are also affecting patient's functional outcome.   REHAB POTENTIAL: Fair    CLINICAL DECISION MAKING: Evolving/moderate complexity  EVALUATION COMPLEXITY: High  PLAN:  PT FREQUENCY: 1-2x/week  PT DURATION: 12 weeks  PLANNED INTERVENTIONS: 02835- PT Re-evaluation, 97750- Physical Performance Testing,  97110-Therapeutic exercises, 97530- Therapeutic activity, [id number]- Neuromuscular re-education, [phone]- Self Care, 02859- Manual therapy, [phone]- Gait training, [phone]- Orthotic Initial, [phone]- Orthotic/Prosthetic subsequent, [phone]- Canalith repositioning, Patient/Family education, Balance training, Stair training, Taping, Joint mobilization, Spinal mobilization, Vestibular training, DME instructions, Wheelchair mobility training, Cryotherapy, and Moist heat  PLAN FOR NEXT SESSION:  backwards gait training using RW with skilled assistance when available with limited distractions Transfer training - learning sequence of AD management and stepping back to seat Gait with least restrictive AD vs. HHA Dynamic balance    Inas Avena, PT, DPT, NCS, CSRS Physical Therapist - [facility]  [facility]  11:51 AM 09/26/23

## 2023-09-26 NOTE — Patient Instructions (Signed)
 ALWAYS FEEL FREE TO CALL OUR OFFICE WITH ANY PROBLEMS OR QUESTIONS [phone])  **PLEASE NOTE** ALL MEDICATION REFILL REQUESTS (INCLUDING CONTROLLED SUBSTANCES) NEED TO BE MADE AT LEAST 7 DAYS PRIOR TO REFILL BEING DUE. ANY REFILL REQUESTS INSIDE THAT TIME FRAME MAY RESULT IN DELAYS IN RECEIVING YOUR PRESCRIPTION.

## 2023-09-26 NOTE — Progress Notes (Signed)
 Subjective:    Patient ID: Beverly [name], female    DOB: 03/05/1985, 38 y.o.   MRN: [id number]  HPI Landen is here in follow up of her ABI. She has been fairly steady since I last saw her. She occasionally will take po but when she decides to eat/swallow seems to be fairly random. It seems  that her saliva management is better. She tends to puff her cheeks out stil. Teddi might be giving her a bit more verbal feedback at times, especially to emotional exchanges. She may do a little better when she's comfortable around family and friends.   She still has issues with her balance but does not like to use a rolling walker.  She tends to do better with hand-held assistance.  She got up the other night and mom could not find her and she happened to be sitting on the bathroom toilet.  Sleep has been more of a problem lately.  The 50 mg of trazodone  does not seem to be as effective as it once was.  Mood seems generally positive except for when she gets frustrated over not being able to do certain things.  Pain Inventory Average Pain 0 Pain Right Now 0 My pain is No pain  LOCATION OF PAIN  No pain.   BOWEL Number of stools per week: 7 or more Oral laxative use No  Type of laxative Miralax  Enema or suppository use No  History of colostomy No  Incontinent Getting better with communication.  BLADDER Pads  Bladder incontinence Yes     Mobility walk with assistance use a walker ability to climb steps?  yes do you drive?  no use a wheelchair Do you have any goals in this area?  yes  Function disabled: date disabled 2021 I need assistance with the following:  feeding, dressing, bathing, toileting, meal prep, household duties, and shopping Do you have any goals in this area?  yes  Neuro/Psych weakness trouble walking confusion  Prior Studies Any changes since last visit?  no  Physicians involved in your care Any changes since last visit?  no   Family History  Problem  Relation Age of Onset   Hyperlipidemia Mother    Hypertension Mother    Diabetes Maternal Grandmother    Seizures Maternal Grandmother    Thyroid  disease Maternal Grandmother    Diabetes Paternal Grandmother    Rashes / Skin problems Son        ezcema   Seizures Son    Social History   Socioeconomic History   Marital status: Single    Spouse name: Not on file   Number of children: 3   Years of education: Not on file   Highest education level: Not on file  Occupational History   Occupation: oncology    Employer: [employer]    Comment: NA  Tobacco Use   Smoking status: Never   Smokeless tobacco: Never  Vaping Use   Vaping status: Never Used  Substance and Sexual Activity   Alcohol use: Not Currently    Alcohol/week: 0.0 standard drinks of alcohol    Comment: quit 2017   Drug use: No    Frequency: 5.0 times per week    Types: Marijuana    Comment: stopped when she found out she was pregnant   Sexual activity: Not Currently    Partners: Male    Birth control/protection: None  Other Topics Concern   Not on file  Social History Narrative   01/06/20  lives with mom and her children   Social Drivers of Corporate investment banker Strain: Not on file  Food Insecurity: Not on file  Transportation Needs: Not on file  Physical Activity: Not on file  Stress: Not on file  Social Connections: Not on file   Past Surgical History:  Procedure Laterality Date   CARDIAC CATHETERIZATION     IR GASTROSTOMY TUBE MOD SED  10/07/2019   IR GASTROSTOMY TUBE REMOVAL  07/02/2020   IR REPLC GASTRO/COLONIC TUBE PERCUT W/FLUORO  11/03/2021   IR REPLC GASTRO/COLONIC TUBE PERCUT W/FLUORO  03/09/2023   LEFT HEART CATH AND CORONARY ANGIOGRAPHY N/A 09/23/2019   Procedure: LEFT HEART CATH AND CORONARY ANGIOGRAPHY;  Surgeon: Mady [name], MD;  Location: [facility] INVASIVE CV LAB;  Service: Cardiovascular;  Laterality: N/A;   SUBQ ICD IMPLANT N/A 11/26/2020   Procedure: SUBQ ICD IMPLANT;   Surgeon: [name], MD;  Location: [facility] INVASIVE CV LAB;  Service: Cardiovascular;  Laterality: N/A;   Past Medical History:  Diagnosis Date   Brain injury (HCC)    CAD (coronary artery disease)    HLD (hyperlipidemia)    Hypertension    last pregnancy   Ischemic cardiomyopathy    STEMI (ST elevation myocardial infarction) (HCC) 09/2019   SCAD with aneurysmal dilation of proximal LAD.   Vaginal Pap smear, abnormal    when she was 38yo   Ventricular fibrillation (HCC) 09/23/2019   BP 101/68   Pulse 92   Ht 5' 9 (1.753 m)   Wt 192 lb (87.1 kg) Comment: w/c  SpO2 95%   BMI 28.35 kg/m   Opioid Risk Score:   Fall Risk Score:  `1  Depression screen [facility] 2/9     05/23/2023   10:46 AM 11/22/2022   11:00 AM 03/22/2022    2:07 PM 10/05/2021    3:35 PM 08/03/2021    3:15 PM 06/01/2021    3:14 PM 03/23/2021    3:03 PM  Depression screen PHQ 2/9  Decreased Interest 0 0 1 0 0 0 0  Down, Depressed, Hopeless 0 0 1 0 0 0 0  PHQ - 2 Score 0 0 2 0 0 0 0    Review of Systems  Musculoskeletal:  Positive for gait problem.  Neurological:  Positive for speech difficulty and weakness.  Psychiatric/Behavioral:  Positive for confusion.   All other systems reviewed and are negative.      Objective:   Physical Exam  General: No acute distress HEENT: NCAT, EOMI, oral membranes moist Cards: reg rate  Chest: normal effort Abdomen: Soft, NT, ND Skin: dry, intact Extremities: no edema Psych: pleasant and appropriate  Skin: Warm and dry.  Intact.    Musc: No edema in extremities.  No tenderness in extremities. Neuro: engages and follows commands more so than past visit.  Can be cued to respond. Still pockets puffs cheeks but no saliva.. Remains  apraxic    Assessment & Plan:  1. Anoxic BI - substantial attention, initiation deficits as well as memory deficits              MRI/CTs unremarkable             -Recent ICD placement.           --continue sinemet  to 25/100 but increase  to qid  Continue [facility] speech therapy for her ongoing language and swallowing recovery.   -family needs to continue with reminders. Schedule, routine. 2. Dysphagia:             -  ongoing oral motor apraxia and coordination.  -Cont PEG as a back up for now             -[facility] SLP for cognition and swallowing . -NO E-STIM,  -[facility] speech therapy as above               3. Gait abnormality             Cont ambulation with assistance               RW for balance, as possible             -outpt PT at [facility]. 3. Sleep disturbance: improved             Melatonin 3 mg daily --can take with trazodone             -trazodone  at night time scheduled. increase to 100mg               -More alert     20 minutes of face to face patient care time were spent during this visit. All questions were encouraged and answered.  Follow up with me in 4 mos .

## 2023-09-27 NOTE — Progress Notes (Signed)
 Patient for IR G-tube Exchange on Friday 09/28/23, I called and spoke with the patient's Mother, Ms Janice on the phone and gave pre-procedure instructions. Ms Janice was made aware to have the patient here at 12:30p and check in at the new entrance. Ms Janice stated understanding. Called 09/24/23 and 09/27/23

## 2023-09-28 ENCOUNTER — Ambulatory Visit
Admission: RE | Admit: 2023-09-28 | Discharge: 2023-09-28 | Disposition: A | Discharge status: Home / Self Care | Source: Ambulatory Visit | Attending: Interventional Radiology | Admitting: Interventional Radiology

## 2023-09-28 ENCOUNTER — Other Ambulatory Visit: Payer: Self-pay | Admitting: Interventional Radiology

## 2023-09-28 DIAGNOSIS — Z431 Encounter for attention to gastrostomy: Secondary | ICD-10-CM | POA: Diagnosis present

## 2023-09-28 HISTORY — PX: IR REPLACE G-TUBE SIMPLE WO FLUORO: IMG2323

## 2023-10-01 ENCOUNTER — Ambulatory Visit: Admitting: Physical Therapy

## 2023-10-01 ENCOUNTER — Ambulatory Visit: Admitting: Occupational Therapy

## 2023-10-01 DIAGNOSIS — M6281 Muscle weakness (generalized): Secondary | ICD-10-CM

## 2023-10-01 DIAGNOSIS — R2689 Other abnormalities of gait and mobility: Secondary | ICD-10-CM

## 2023-10-01 DIAGNOSIS — G931 Anoxic brain damage, not elsewhere classified: Secondary | ICD-10-CM

## 2023-10-01 DIAGNOSIS — R262 Difficulty in walking, not elsewhere classified: Secondary | ICD-10-CM

## 2023-10-01 DIAGNOSIS — R2681 Unsteadiness on feet: Secondary | ICD-10-CM

## 2023-10-01 DIAGNOSIS — R278 Other lack of coordination: Secondary | ICD-10-CM

## 2023-10-01 DIAGNOSIS — R269 Unspecified abnormalities of gait and mobility: Secondary | ICD-10-CM

## 2023-10-01 NOTE — Therapy (Signed)
 OUTPATIENT PHYSICAL THERAPY TREATMENT    Patient Name: Beverly [name] MRN: [id number] DOB:[birth year], 38 y.o., female Today's Date: 10/01/2023  PCP: [facility] REFERRING PROVIDER: Babs Arthea DASEN, MD  END OF SESSION:   PT End of Session - 10/01/23 1108     Visit Number 24    Number of Visits 41    Date for PT Re-Evaluation 11/13/23    Authorization Type UHC Dual Complete; Medicaid secondary    Authorization Time Period 05/30/23-08/22/23    Progress Note Due on Visit 30    PT Start Time 1105    PT Stop Time 1146    PT Time Calculation (min) 41 min    Equipment Utilized During Treatment Gait belt    Activity Tolerance Patient tolerated treatment well    Behavior During Therapy Flat affect;Impulsive;WFL for tasks assessed/performed           Past Medical History:  Diagnosis Date   Brain injury [facility])    CAD (coronary artery disease)    HLD (hyperlipidemia)    Hypertension    last pregnancy   Ischemic cardiomyopathy    STEMI (ST elevation myocardial infarction) (HCC) 09/2019   SCAD with aneurysmal dilation of proximal LAD.   Vaginal Pap smear, abnormal    when she was 38yo   Ventricular fibrillation (HCC) 09/23/2019   Past Surgical History:  Procedure Laterality Date   CARDIAC CATHETERIZATION     IR GASTROSTOMY TUBE MOD SED  10/07/2019   IR GASTROSTOMY TUBE REMOVAL  07/02/2020   IR REPLC GASTRO/COLONIC TUBE PERCUT W/FLUORO  11/03/2021   IR REPLC GASTRO/COLONIC TUBE PERCUT W/FLUORO  03/09/2023   LEFT HEART CATH AND CORONARY ANGIOGRAPHY N/A 09/23/2019   Procedure: LEFT HEART CATH AND CORONARY ANGIOGRAPHY;  Surgeon: Beverly [name], MD;  Location: [facility] INVASIVE CV LAB;  Service: Cardiovascular;  Laterality: N/A;   SUBQ ICD IMPLANT N/A 11/26/2020   Procedure: SUBQ ICD IMPLANT;  Surgeon: Beverly Ole DASEN, MD;  Location: [facility] INVASIVE CV LAB;  Service: Cardiovascular;  Laterality: N/A;   Patient Active Problem List   Diagnosis Date Noted    Apraxia 10/05/2021   S/P ICD (internal cardiac defibrillator) procedure 11/27/2020   Hx of Cardiac arrest (HCC) 11/26/2020   Abnormality of gait 03/16/2020   Sleep disturbance 03/16/2020   Oropharyngeal dysphagia 02/12/2020   Coronary artery disease involving native coronary artery of native heart without angina pectoris 12/12/2019   Ischemic cardiomyopathy 12/12/2019   Brain injury (HCC)    Prediabetes    Anoxic brain injury (HCC) 11/05/2019   Dysphagia 11/05/2019   Physical deconditioning 11/05/2019   Acute delirium 11/05/2019   Respiratory failure (HCC)    Encounter for central line placement    Ventricular fibrillation (HCC) 09/23/2019   Acute combined systolic and diastolic heart failure (HCC) 09/23/2019   Ventricular tachycardia, polymorphic (HCC) 09/23/2019   Pelvic pain affecting pregnancy 10/15/2015   Supervision of normal pregnancy in third trimester 09/28/2015   Poor weight gain of pregnancy 09/28/2015   Iron  deficiency anemia of pregnancy 09/01/2015   Increased BMI (body mass index) 07/29/2015    ONSET DATE: August 2021  REFERRING DIAG:  G93.1 (ICD-10-CM) - Anoxic brain injury (HCC)  R48.2 (ICD-10-CM) - Apraxia    THERAPY DIAG:   Muscle weakness (generalized)  Other lack of coordination  Anoxic brain injury (HCC)  Unsteadiness on feet  Difficulty in walking, not elsewhere classified  Other abnormalities of gait and mobility  Abnormality of gait and mobility  Rationale for Evaluation  and Treatment: Rehabilitation  SUBJECTIVE:                                                                                                                                                                                             SUBJECTIVE STATEMENT:   [name] reports last Monday patient actually got up and walked to the bathroom by herself around 5 AM and she sat on the toilet until around 7:30AM when her mother found her, but reports pt did not fall. [name]  states she is trying to work with patient on using RW, but mostly uses it when walking outside on sidewalk, because mother states it is too challenging to ambulating in tight spaces of home using AD so they do HHA.   Pt accompanied by: self and mother, Beverly [name]  PERTINENT HISTORY:   Patient is a 38 year old with diagnois of anoxic brain injury. Pt known to PT clinic. Previously seen for outpatient services March 2024. Pt was receiving HH PT until October 2024 per her mother's report. [name] reports pt using HHA for ambulation, was using RW but she says was told not to do to difficulty with safe technique. Pt has fallen 1x in the past six months trying to ambulate on her own. Pt requires assistance with ADLs, including bathing and dressing. [name] reports pt never indicates any pain. Pt with severe aphasia so is unable to answer questions/pt's mom provides hx. She reports pt has impaired short-term memory, difficulty with stair climbing (has stair lift at home). She reports pt's BLE are weaker than they used to be. The pt is probably limited to <10 min with ambulation due to poor endurance. Pt reports her goal is to be able to use an AD to achieve mod-I mobility rather than relying on someone's HHA to ambulate.She was receiving outpatient PT until Feb. 2022 last year but ran out of visits. Patient has past medical history of Anoxic Brain Injury 09/2019, CAD, HLD, HTN, ISCHEMIC CARDIMYOPATHY, STEMI, V-FIB, ICD on 11/29/2020. Patient is currently living with mom. Mother reports she is abel to walk some with a walker with supervision but primarily ambulates with HHA of mother who is her guardian. Mother also reports she requires assist for all transfers and ADL's.   PAIN:  Are you having pain? No  PRECAUTIONS: Fall  WEIGHT BEARING RESTRICTIONS: No  FALLS: Has patient fallen in last 6 months? Yes. Number of falls 1  LIVING ENVIRONMENT: Lives with: lives with their family, mother Lives in:  House/apartment Stairs: has stairs but stair lift installed, pt using lift Has following equipment at home: Beverly [name] -  2 wheeled, Wheelchair (manual), shower chair, and Grab bars, pt uses stair lift  PLOF: Needs assistance with ADLs  PATIENT GOALS: balance, walking, and pt states basketball  OBJECTIVE:  Note: Objective measures were completed at Evaluation unless otherwise noted.  DIAGNOSTIC FINDINGS:  None recent in chart  COGNITION: Overall cognitive status: impaired   SENSATION: Unable to complete testing due to impaired cognition, pt unable to report if sensation impairment present, pt mother unsure   EDEMA:  Mom reports pt's feet do swell from time to time   LOWER EXTREMITY MMT:  *accuracy of testing likely impacted by pt difficulty following cues  MMT Right Eval Left Eval  Hip flexion 4+ 4+  Hip extension    Hip abduction 4+ 4+  Hip adduction 4+ 4+  Hip internal rotation    Hip external rotation    Knee flexion 4+ 4+  Knee extension 5 5  Ankle dorsiflexion    Ankle plantarflexion    Ankle inversion    Ankle eversion    (Blank rows = not tested)  TRANSFERS: HHA  tried in session, very unsteady, pt has difficulty with motor planning, little success with cues due to distraction, very unsafe Attempted in session with RW, pt unsteady, gait ataxic, continued significant difficulty with motor planning requiring up to min a + 2 and multi-modal cues to safely sit in chair.   FUNCTIONAL TESTS:  5 times sit to stand: 1 min 43 sec Timed up and go (TUG): 1 min 30 sec with RW 10 meter walk test: 0.25 m/s with RW extensive cuing to complete activity  PATIENT SURVEYS:  SIS-16 filled out by pt's mother, used as questions relevant to pt: 45                                                                                                                              TREATMENT DATE: 10/01/2023  Pt arrived to therapy session in transport chair.  Unless otherwise stated, CGA/min  A was provided and gait belt donned in order to ensure pt safety throughout session.  L stand pivot transport chair>green chair with R HHA and armrest support requiring light min A for balance and pt improving in ability to turn and sit without AD.   Gait training 300 ft using RW with CGA progressed to consistent skilled min assist for balance and AD management as she fatigues. Still has tendency to veer walker to right, but primarily right before she is about to turn R, or as she becomes fatigued Improved AD management with decreased tendency to push walker too far in front of her, until the end as she fatigues requiring more frequent cuing to step into the walker  Pt maintains slow gait speed with min reciprocal steps Improved base of support with pt no longer kicking back leg of RW with L LE, but as she fatigues she has wider BOS *performed gait through kitchen to work on AD management in more  of a home environment - had pt go to corner in kitchen and turn around to manage AD - requires min cuing and significantly increased time to problem solve Noticed when pt does have L LOB, she lets go of AD rather than using it for balance support, so pt will need stepping balance recovery training Patient continues to have greatest challenge managing AD when turning, but especially so when stepping backwards to sit in chair, at end of gait  *Noticed pt has improved ability to transfer into chair when approaching it form her L compared to R, due to pt having increaesed fear and impaired motor planning stepping R LE backwards  Moved into hallway and practiced forward/backwards ~11ft gait training using RW 2 sets of 2 reps  Focusing on performing 1 step with each LE and then bringing AD backwards Educated mom on this cuing in order to allow reinforcement of it at home 2nd person providing supervision for safety during, but not providing hands-on assistance  Skilled min to mod A on L side for balance and to  bring AD back to her after each step Pt continues to have greater difficulty stepping R LE back Pt continues to have tendency to initiate posterior hip weight shift and push AD forward with arms extended when she gets close to a chair rather than stepping back fully, but improving today, although inconsistent  Dynamic gait training ~86ft using RW while navigating around obstacles (3x 1/2 foam rolls placed in zig-zag configuration through doorway) targeting improved AD management in tighter spaces - as the environment becomes more busy patient's AD management declines.  Patient continues to remain easily distractible in clinic environment, requiring cuing to refocus attention on current task.   PATIENT EDUCATION: Education details: goal reassessment, indications, exercise technique Person educated: Patient and Parent Education method: Explanation, Demonstration, Tactile cues, and Verbal cues Education comprehension: returned demonstration, verbal cues required, tactile cues required, and needs further education  HOME EXERCISE PROGRAM: Access Code: [email or web address] URL: [email or web address] Date: 06/11/2023 Prepared by: Massie Dollar  Exercises - Standing March with Counter Support  - 1 x daily - 7 x weekly - 2 sets - 8 reps - Side Stepping with Counter Support  - 1 x daily - 7 x weekly - 2 sets - 4 reps - Sit to Stand with Armchair  - 1 x daily - 7 x weekly - 2 sets - 5 reps - Seated March  - 1 x daily - 7 x weekly - 3 sets - 10 reps  GOALS: Goals reviewed with patient? Yes   SHORT TERM GOALS: Target date: 07/11/2023  Patient will be independent in home exercise program to improve strength/mobility for better functional independence with ADLs. Baseline: Goal status: INITIAL   LONG TERM GOALS: Target date: 11/13/2023   1.  Patient will complete five times sit to stand test in < 35 seconds indicating an increased LE strength and improved balance. Baseline: previously 37 sec  with UUE 04/19/22 today 05/30/23: 1 min 43 seconds; 6/3: 41 sec with improved technique (use of BUE on armrests);  08/21/2023= 38 sec with some distractibility and VC for hand placement 7:23: 39.06 (41.23, 36.89 sec) Goal status: PARTIALLY MET  2. Patient will demo ability to ambulate > 400 ft w/ LRAD versus hand held assist min assist to demonstrate improved household mobility and short community distances.  Baseline: 4/28: 159ft with RW. Mod assist for AD management to prevent veer to the R. 6/3: ~ 300 ft with RW, requires cuing  throughout for proximity to RW particularly with turns, but this has improved compared to eval;  08/21/2023= Patient ambulated approx 300 feet with RW- Sill some min assist at times to navigate walker and VC to remain close to walker.  7/23: 328ft with RW and min assist for safety and moderate instruction for safety AD intermittently, especially with fatigue  Goal status: IN PROGRESS  3.  Patient will increase 10 meter walk test to >1.51m/s as to improve gait speed for better community ambulation and to reduce fall risk. Baseline: previously 0.36 m/s on 04/19/22 : 0.25 m/s;  07/10/23: 0. 24 m/s with RW;  08/15/2023= 0.39 m/s  with RW yet max VC for RW navigation.  7/23:Average Normal speed with RW: 0.25 m/s Average Normal speed with HHA: 0.75m/s Goal status: PROGRESSING  4.  Patient will reduce timed up and go (TUG) to <50 seconds to reduce fall risk and demonstrate improved transfer/gait ability Baseline: previously 91 sec with RW on 04/19/22; 05/30/23: 1 min 30 sec with RW 07/19/2023: 48.265 seconds using RW with skilled min A primarily for AD management with pt having difficulty turning with AD;  08/21/2023- 33 sec with HHA and 1 min 25 sec with RW (Max VC for safety with turning back to seat and to reach back safely)  7/23:42.18sec with RW and min assist for safety in turn; 40.35 sec with HHA and min assist for safety with turns.  Goal status: IN PROGRESS  5. Pt will exhibit  correct and safe technique with stand>sit into a standard chair or transport chair to reduce risk of falling.   Baseline: pt current technique unsafe, poor motor planning; 6/3: pt with improved technique, bring RW closer to chair prior to sitting; 08/21/2023- Patient able to perform with CGA yet inconsistent with cues for safety and consistency with hand placement.   Goal status: PARTIALLY MET   6. The pt will demonstrate at least a 15 point improvement on SIS-16 to indicate increased ease with ADLs. Baseline: 45 (filled out by pt's mother); 42; 08/21/2023= 46; 09/03/2023: 46/80 Goal status: ONGOING   ASSESSMENT:  CLINICAL IMPRESSION:  Treatment continued with gait training with use of RW to try to improve safety and consistency with use of device. Patient demonstrates significant improvement in RW management resulting in decreased need for cuing, until she becomes fatigued. Patient also demonstrates decreased tendency to veer AD towards the R, until she becomes fatigued. Patient continues to demonstrate greatest deficit when turning to sit in chair at end of gait with it appearing patient continues to have increased fear of falling when stepping posteriorly. Therefore, continued focus on forward/backwards gait training with RW to improve confidence and motor plan sequencing of backwards gait - she continues to require max cuing to be successful with this task; however, was able to perform with only 1 skilled assist and +2 providing supervision for safety! Therapist also initiated gait training using RW in smaller spaces to simulate home environment because her mother reports this as a barrier for patient's success with RW use at home. Patient will benefit from continuation of this training in future sessions. Pt will continue to benefit from skilled physical therapy intervention to address impairments, improve QOL, and attain therapy goals.       OBJECTIVE IMPAIRMENTS: Abnormal gait, decreased  activity tolerance, decreased balance, decreased coordination, decreased endurance, decreased knowledge of use of DME, decreased mobility, difficulty walking, decreased strength, decreased safety awareness, increased edema, impaired tone, improper body mechanics, and postural dysfunction.  ACTIVITY LIMITATIONS: carrying, lifting, bending, standing, squatting, stairs, transfers, bathing, dressing, locomotion level, and caring for others  PARTICIPATION LIMITATIONS: meal prep, cleaning, laundry, medication management, personal finances, driving, shopping, community activity, occupation, and yard work  PERSONAL FACTORS: Sex, Time since onset of injury/illness/exacerbation, and 1-2 comorbidities: per chart PMH includes CAD, HTN, ischemic cardiomyopathy, STEMI, ventricular fibrillation, dysphagia, s/p ICD, apraxia  are also affecting patient's functional outcome.   REHAB POTENTIAL: Fair    CLINICAL DECISION MAKING: Evolving/moderate complexity  EVALUATION COMPLEXITY: High  PLAN:  PT FREQUENCY: 1-2x/week  PT DURATION: 12 weeks  PLANNED INTERVENTIONS: 97164- PT Re-evaluation, 97750- Physical Performance Testing, 97110-Therapeutic exercises, 97530- Therapeutic activity, 97112- Neuromuscular re-education, 97535- Self Care, [id number]- Manual therapy, [phone]- Gait training, [phone]- Orthotic Initial, [phone]- Orthotic/Prosthetic subsequent, [phone]- Canalith repositioning, Patient/Family education, Balance training, Stair training, Taping, Joint mobilization, Spinal mobilization, Vestibular training, DME instructions, Wheelchair mobility training, Cryotherapy, and Moist heat  PLAN FOR NEXT SESSION:  backwards gait training using RW with skilled assistance - perform in environment with limited distractions to allow improved motor learning Transfer training - learning sequence of AD management and stepping back to seat Gait training using RW in home-like environment Dynamic balance Reaching with use of Blaze Pods  for tapping hands    Renato Spellman, PT, DPT, NCS, CSRS Physical Therapist - [facility]  [facility]  11:52 AM 10/01/23

## 2023-10-01 NOTE — Therapy (Signed)
 OUTPATIENT OCCUPATIONAL THERAPY NEURO TREATMENT  Patient Name: Beverly [name] MRN: [id number] DOB:1985-05-07, 38 y.o., female Today's Date: 10/01/2023   REFERRING PROVIDER: Babs Hussar, MD  END OF SESSION:  OT End of Session - 10/01/23 1713     Visit Number 9    Number of Visits 12    Date for OT Re-Evaluation 10/11/23    OT Start Time 1150    OT Stop Time 1230    OT Time Calculation (min) 40 min    Activity Tolerance Patient tolerated treatment well    Behavior During Therapy Flat affect;Impulsive;WFL for tasks assessed/performed           Past Medical History:  Diagnosis Date   Brain injury [facility])    CAD (coronary artery disease)    HLD (hyperlipidemia)    Hypertension    last pregnancy   Ischemic cardiomyopathy    STEMI (ST elevation myocardial infarction) (HCC) 09/2019   SCAD with aneurysmal dilation of proximal LAD.   Vaginal Pap smear, abnormal    when she was 38yo   Ventricular fibrillation (HCC) 09/23/2019   Past Surgical History:  Procedure Laterality Date   CARDIAC CATHETERIZATION     IR GASTROSTOMY TUBE MOD SED  10/07/2019   IR GASTROSTOMY TUBE REMOVAL  07/02/2020   IR REPLACE G-TUBE SIMPLE WO FLUORO  09/28/2023   IR REPLC GASTRO/COLONIC TUBE PERCUT W/FLUORO  11/03/2021   IR REPLC GASTRO/COLONIC TUBE PERCUT W/FLUORO  03/09/2023   LEFT HEART CATH AND CORONARY ANGIOGRAPHY N/A 09/23/2019   Procedure: LEFT HEART CATH AND CORONARY ANGIOGRAPHY;  Surgeon: Mady [name], MD;  Location: [facility] INVASIVE CV LAB;  Service: Cardiovascular;  Laterality: N/A;   SUBQ ICD IMPLANT N/A 11/26/2020   Procedure: SUBQ ICD IMPLANT;  Surgeon: [name], MD;  Location: [facility] INVASIVE CV LAB;  Service: Cardiovascular;  Laterality: N/A;   Patient Active Problem List   Diagnosis Date Noted   Apraxia 10/05/2021   S/P ICD (internal cardiac defibrillator) procedure 11/27/2020   Hx of Cardiac arrest (HCC) 11/26/2020   Abnormality of gait 03/16/2020   Sleep disturbance  03/16/2020   Oropharyngeal dysphagia 02/12/2020   Coronary artery disease involving native coronary artery of native heart without angina pectoris 12/12/2019   Ischemic cardiomyopathy 12/12/2019   Brain injury (HCC)    Prediabetes    Anoxic brain injury (HCC) 11/05/2019   Dysphagia 11/05/2019   Physical deconditioning 11/05/2019   Acute delirium 11/05/2019   Respiratory failure (HCC)    Encounter for central line placement    Ventricular fibrillation (HCC) 09/23/2019   Acute combined systolic and diastolic heart failure (HCC) 09/23/2019   Ventricular tachycardia, polymorphic (HCC) 09/23/2019   Pelvic pain affecting pregnancy 10/15/2015   Supervision of normal pregnancy in third trimester 09/28/2015   Poor weight gain of pregnancy 09/28/2015   Iron  deficiency anemia of pregnancy 09/01/2015   Increased BMI (body mass index) 07/29/2015   ONSET DATE: 09/23/19  REFERRING DIAG: Anoxic Brain Injury  THERAPY DIAG:  Muscle weakness (generalized)  Other lack of coordination  Rationale for Evaluation and Treatment: Rehabilitation  SUBJECTIVE:  SUBJECTIVE STATEMENT: Pt.'s mother was inquiring about a resting hand splint for nighttime use for the right hand. Pt accompanied by:  Father  PERTINENT HISTORY:  Pt. Is a 38 y.o. female with a history of severe aphasia  from a Cradiac Arrest leading to Anoxic Brain Injury on 09/23/19 at 2 weeks postpartum. Pt. Currently hs a G-Tube in place. PMHx includes: CAD, HLD, HTN, Ischemic cardiomyopathy, STEMI,  V-Fib, and ICD on 11/29/20.  PRECAUTIONS: None  WEIGHT BEARING RESTRICTIONS: No  PAIN:  Are you having pain? No  FALLS: Has patient fallen in last 6 months? No  LIVING ENVIRONMENT: Lives with: mother Lives in: House/apartment Stairs:  two story; No steps to enter. 10 steps inside, and has a chair lift Has following equipment at home: Walker - 2 wheeled and Wheelchair (manual), stair chair lift  PLOF: Independent  PATIENT GOALS: To  improve bilateral hand function for increased engagement in ADLs, and IADL tasks.  OBJECTIVE:  Note: Objective measures were completed at Evaluation unless otherwise noted.  HAND DOMINANCE: Left  ADLs: Per father report  Transfers/ambulation related to ADLs: Eating:  Dependent/feeding tube Grooming: Independent washing face, assist with hair care  UB Dressing: Assist with donning shirt LB Dressing: Assist with donning LE clothing  Toileting: Assist with transfers, toilet hygiene skills, and clothing negotiation Bathing:  Unknown  IADLs: Per father report Shopping: Total A Light housekeeping: Total A Meal Prep: Total A Medication management:   Total A Financial management: Total A Handwriting: 50% legible printing name only using 1 inch letters.  MOBILITY STATUS: Needs Assist: using walker  POSTURE COMMENTS:   Sitting balance:  fair  ACTIVITY TOLERANCE: Activity tolerance:  Fair   FUNCTIONAL OUTCOME MEASURES: TBD  UPPER EXTREMITY ROM:    Active ROM Right Eval WFL Left Eval [employer]  Shoulder flexion    Shoulder abduction    Shoulder adduction    Shoulder extension    Shoulder internal rotation    Shoulder external rotation    Elbow flexion    Elbow extension    Wrist flexion    Wrist extension    Wrist ulnar deviation    Wrist radial deviation    Wrist pronation    Wrist supination    (Blank rows = not tested)  UPPER EXTREMITY MMT:     MMT Right eval Left eval  Shoulder flexion 3/5 3+/5  Shoulder abduction 3/5 3+/5  Shoulder adduction    Shoulder extension    Shoulder internal rotation    Shoulder external rotation    Middle trapezius    Lower trapezius    Elbow flexion 3/5 3+/5  Elbow extension 3/5 3+/5  Wrist flexion    Wrist extension 3/5 3+/5  Wrist ulnar deviation    Wrist radial deviation    Wrist pronation    Wrist supination    (Blank rows = not tested)  HAND FUNCTION: Grip strength: Right: 10 lbs; Left: 18 lbs, Lateral pinch:  Right: 4 lbs, Left: 7 lbs.  COORDINATION:  Nine Hole Peg test: Pt. was able to formulate isolated 2nd digit extension in anticipation for grasping the horizontal pegs.  SENSATION: Light touch intact  EDEMA: N/A   COGNITION: Overall cognitive status: impaired   VISION:  Baseline:  wears glasses.   VISION ASSESSMENT: To be further assessed in functional context  PERCEPTION: WFL  PRAXIS: WFL  TREATMENT DATE: 10/01/2023  Self Care: -Pt. worked on using the bilateral hand to engage in ADL of washing the BUEs -Pt. worked on opening a body wash bottle with one hand while stabilizing the base with the opposite hand promoting bimanual dexterity skills with the task being modified to stabilize the bottle on the tabletop surface using Dyce[employer]. -Pt. Worked on reps of twitting then bottle top open, and closing tightly.  -Pt. Worked on isolating her 2nd digit to press the body wash bottle open. -Pt. worked on the coordinating the movements needed to pour the body wash onto the sponge mit. -Pt. Worked on using the sponge mit to simulate washing each arm thoroughly requiring cues, and assist to apply the appropriate amount of pressure with the mit. -Pt./caregiver education was provided about opportunities to engage her bilateral hands in, and practice ADL tasks.      PATIENT EDUCATION: Education details: washing her bilateral UEs, opening a body wash bottle, and pressing a lid open. Resting hand splint Person educated: Parent Education method: Explanation and Verbal cues Education comprehension: verbalized understanding  HOME EXERCISE PROGRAM:  -increasing opportunities for engaging the bilateral hands during ADL tasks.  GOALS: Goals reviewed with patient? Yes  SHORT TERM GOALS: Target date: 08/30/2023    Pt. Will require supervision HEPs for BUE strengthening  Baseline: Eval: No  current HEP. Goal status: INITIAL   LONG TERM GOALS: Target date: 10/11/2023   Pt. Will increase BUE strength by 2 mm grades to assist with ADLs, and IADLs. Baseline:  Eval:  RUE: 3/5 overall, LUE: 3+/5 overall Goal status: INITIAL  2.  Pt. will improve bilateral grip strength by 5# to be able to securely hold items in her hands. Baseline:  Eval: Grip Right: 10#, Left: 18# Goal status: INITIAL  3.  Pt. Will improve bilateral lateral pinch strength by 3# to assist with grooming tasks. Baseline: Eval: R: 4#, L: 7# Goal status: INITIAL  4.  Pt. Will improve bilateral hand FMC grasping, and placing one peg in the 9 hole peg test in preparation for improved manipulation of small objects. Baseline: Eval: Pt. is able to initiate formulating isolated 2nd digit extension in preparation for grasping small objects. Goal status: INITIAL  5.  Pt.  Will write her name her full name with 75% legibility. Baseline: Eval: 50% legible printing name only using 1 inch letters. Goal status: INITIAL  6. Pt.  Will demonstrate modified techniques for ADLs with minA.  Baseline: Eval: Does not currently use adaptive, or modified techniques during ADLS.   7.  Pt will brush teeth with set up and mod A for thoroughness.  Baseline: Dep  Goal status: New (08/29/23)  8.  Pt will don tshirt with min A.  Baseline: Max-dep; mother reports pt gets her shirt turned around and often struggles to pull over her head  Goal status: New (08/29/23  9. Pt will spit saliva into cup using cup with handle and wipe mouth with washcloth/hand towel with supv-min A.  Baseline: Max-dep; pt squeezes plastic cup too hard/paper cup slips from hands.  Pt struggles to wipe mouth thoroughly with napkin or tissue.  Goal status: New (08/29/23)   ASSESSMENT: CLINICAL IMPRESSION:  Information, and measurements were provided to Pt.'s mother about a right hand SoftPro resting hand splint for nighttime use. Pt. requires cues,  assist, and  increased time for motor planning through each of the movements of the bathing  task from working on opening the body wash bottle, twisting the  cap on/off, pressing the lid open/closed, pouring the body wash onto the mit, and ultimately simulated washing each arm. Pt. Required the task to be modified to focus on one component movement of the task at a time. Pt. Requires cues, and hand over hand assist for applying the appropriate amount of pressure through the sponge when washing her arms. Pt. Required intermittent intervals of  proprioceptive reset with the right hand flat at the tabletop surface. Pt. continues to benefit from OT services to maximize pt's participation in ADLs, increase ADL safety, and provide caregiver education as needed to ease caregiver assist with ADLs.     PERFORMANCE DEFICITS: in functional skills including ADLs, IADLs, coordination, dexterity, proprioception, ROM, strength, pain, Fine motor control, and UE functional use, cognitive skills including attention, memory, problem solving, and safety awareness, and psychosocial skills including coping strategies, environmental adaptation, and routines and behaviors.   IMPAIRMENTS: are limiting patient from ADLs, IADLs, and leisure.   CO-MORBIDITIES: may have co-morbidities  that affects occupational performance. Patient will benefit from skilled OT to address above impairments and improve overall function.  MODIFICATION OR ASSISTANCE TO COMPLETE EVALUATION: Min-Moderate modification of tasks or assist with assess necessary to complete an evaluation.  OT OCCUPATIONAL PROFILE AND HISTORY: Detailed assessment: Review of records and additional review of physical, cognitive, psychosocial history related to current functional performance.  CLINICAL DECISION MAKING: Moderate - several treatment options, min-mod task modification necessary  REHAB POTENTIAL: Good  EVALUATION COMPLEXITY: Moderate    PLAN:  OT FREQUENCY:  1x/week  OT DURATION: 12 weeks  PLANNED INTERVENTIONS: 97535 self care/ADL training, 02889 therapeutic exercise, 97530 therapeutic activity, 97112 neuromuscular re-education, 97140 manual therapy, 97018 paraffin, 02989 moist heat, 97760 Orthotic Initial, 97761 Prosthetic Initial, 97763 Orthotic/Prosthetic subsequent, functional mobility training, visual/perceptual remediation/compensation, energy conservation, patient/family education, and DME and/or AE instructions  RECOMMENDED OTHER SERVICES: PT/ST  CONSULTED AND AGREED WITH PLAN OF CARE: Patient  PLAN FOR NEXT SESSION: Treatment  Richardson Otter, MS, OTR/L   10/01/23

## 2023-10-03 ENCOUNTER — Ambulatory Visit: Admitting: Physical Therapy

## 2023-10-03 ENCOUNTER — Ambulatory Visit

## 2023-10-03 DIAGNOSIS — M6281 Muscle weakness (generalized): Secondary | ICD-10-CM | POA: Diagnosis not present

## 2023-10-03 DIAGNOSIS — R278 Other lack of coordination: Secondary | ICD-10-CM

## 2023-10-03 DIAGNOSIS — R2689 Other abnormalities of gait and mobility: Secondary | ICD-10-CM

## 2023-10-03 DIAGNOSIS — R41841 Cognitive communication deficit: Secondary | ICD-10-CM

## 2023-10-03 DIAGNOSIS — R269 Unspecified abnormalities of gait and mobility: Secondary | ICD-10-CM

## 2023-10-03 DIAGNOSIS — R2681 Unsteadiness on feet: Secondary | ICD-10-CM

## 2023-10-03 DIAGNOSIS — R262 Difficulty in walking, not elsewhere classified: Secondary | ICD-10-CM

## 2023-10-03 DIAGNOSIS — G931 Anoxic brain damage, not elsewhere classified: Secondary | ICD-10-CM

## 2023-10-03 NOTE — Therapy (Signed)
 OUTPATIENT PHYSICAL THERAPY TREATMENT    Patient Name: Beverly [name] MRN: [id number] DOB:10/21/1985, 38 y.o., female Today's Date: 10/03/2023  PCP: [facility] REFERRING PROVIDER: Babs Arthea DASEN, MD  END OF SESSION:   PT End of Session - 10/03/23 1133     Visit Number 25    Number of Visits 41    Date for PT Re-Evaluation 11/13/23    Authorization Type UHC Dual Complete; Medicaid secondary    Authorization Time Period 05/30/23-08/22/23    Progress Note Due on Visit 30    PT Start Time 1145    PT Stop Time 1230    PT Time Calculation (min) 45 min    Equipment Utilized During Treatment Gait belt    Activity Tolerance Patient tolerated treatment well    Behavior During Therapy Flat affect;Impulsive;WFL for tasks assessed/performed           Past Medical History:  Diagnosis Date   Brain injury [facility])    CAD (coronary artery disease)    HLD (hyperlipidemia)    Hypertension    last pregnancy   Ischemic cardiomyopathy    STEMI (ST elevation myocardial infarction) (HCC) 09/2019   SCAD with aneurysmal dilation of proximal LAD.   Vaginal Pap smear, abnormal    when she was 38yo   Ventricular fibrillation (HCC) 09/23/2019   Past Surgical History:  Procedure Laterality Date   CARDIAC CATHETERIZATION     IR GASTROSTOMY TUBE MOD SED  10/07/2019   IR GASTROSTOMY TUBE REMOVAL  07/02/2020   IR REPLACE G-TUBE SIMPLE WO FLUORO  09/28/2023   IR REPLC GASTRO/COLONIC TUBE PERCUT W/FLUORO  11/03/2021   IR REPLC GASTRO/COLONIC TUBE PERCUT W/FLUORO  03/09/2023   LEFT HEART CATH AND CORONARY ANGIOGRAPHY N/A 09/23/2019   Procedure: LEFT HEART CATH AND CORONARY ANGIOGRAPHY;  Surgeon: Mady [name], MD;  Location: [facility] INVASIVE CV LAB;  Service: Cardiovascular;  Laterality: N/A;   SUBQ ICD IMPLANT N/A 11/26/2020   Procedure: SUBQ ICD IMPLANT;  Surgeon: [name], MD;  Location: [facility] INVASIVE CV LAB;  Service: Cardiovascular;  Laterality: N/A;   Patient  Active Problem List   Diagnosis Date Noted   Apraxia 10/05/2021   S/P ICD (internal cardiac defibrillator) procedure 11/27/2020   Hx of Cardiac arrest (HCC) 11/26/2020   Abnormality of gait 03/16/2020   Sleep disturbance 03/16/2020   Oropharyngeal dysphagia 02/12/2020   Coronary artery disease involving native coronary artery of native heart without angina pectoris 12/12/2019   Ischemic cardiomyopathy 12/12/2019   Brain injury (HCC)    Prediabetes    Anoxic brain injury (HCC) 11/05/2019   Dysphagia 11/05/2019   Physical deconditioning 11/05/2019   Acute delirium 11/05/2019   Respiratory failure (HCC)    Encounter for central line placement    Ventricular fibrillation (HCC) 09/23/2019   Acute combined systolic and diastolic heart failure (HCC) 09/23/2019   Ventricular tachycardia, polymorphic (HCC) 09/23/2019   Pelvic pain affecting pregnancy 10/15/2015   Supervision of normal pregnancy in third trimester 09/28/2015   Poor weight gain of pregnancy 09/28/2015   Iron  deficiency anemia of pregnancy 09/01/2015   Increased BMI (body mass index) 07/29/2015    ONSET DATE: August 2021  REFERRING DIAG:  G93.1 (ICD-10-CM) - Anoxic brain injury (HCC)  R48.2 (ICD-10-CM) - Apraxia    THERAPY DIAG:   Muscle weakness (generalized)  Other lack of coordination  Anoxic brain injury (HCC)  Unsteadiness on feet  Difficulty in walking, not elsewhere classified  Other abnormalities of gait and mobility  Abnormality of gait and mobility  Cognitive communication deficit  Rationale for Evaluation and Treatment: Rehabilitation  SUBJECTIVE:                                                                                                                                                                                             SUBJECTIVE STATEMENT:   Pt denies any falls since the last visit.  Pt otherwise is doing well.    Pt accompanied by: self and mother, [name]  PERTINENT  HISTORY:   Patient is a 38 year old with diagnois of anoxic brain injury. Pt known to PT clinic. Previously seen for outpatient services March 2024. Pt was receiving HH PT until October 2024 per her mother's report. [name] reports pt using HHA for ambulation, was using RW but she says was told not to do to difficulty with safe technique. Pt has fallen 1x in the past six months trying to ambulate on her own. Pt requires assistance with ADLs, including bathing and dressing. [name] reports pt never indicates any pain. Pt with severe aphasia so is unable to answer questions/pt's mom provides hx. She reports pt has impaired short-term memory, difficulty with stair climbing (has stair lift at home). She reports pt's BLE are weaker than they used to be. The pt is probably limited to <10 min with ambulation due to poor endurance. Pt reports her goal is to be able to use an AD to achieve mod-I mobility rather than relying on someone's HHA to ambulate.She was receiving outpatient PT until Feb. 2022 last year but ran out of visits. Patient has past medical history of Anoxic Brain Injury 09/2019, CAD, HLD, HTN, ISCHEMIC CARDIMYOPATHY, STEMI, V-FIB, ICD on 11/29/2020. Patient is currently living with mom. Mother reports she is abel to walk some with a walker with supervision but primarily ambulates with HHA of mother who is her guardian. Mother also reports she requires assist for all transfers and ADL's.   PAIN:  Are you having pain? No  PRECAUTIONS: Fall  WEIGHT BEARING RESTRICTIONS: No  FALLS: Has patient fallen in last 6 months? Yes. Number of falls 1  LIVING ENVIRONMENT: Lives with: lives with their family, mother Lives in: House/apartment Stairs: has stairs but stair lift installed, pt using lift Has following equipment at home: Environmental consultant - 2 wheeled, Wheelchair (manual), shower chair, and Grab bars, pt uses stair lift  PLOF: Needs assistance with ADLs  PATIENT GOALS: balance, walking, and pt  states basketball  OBJECTIVE:  Note: Objective measures were completed at Evaluation unless otherwise noted.  DIAGNOSTIC FINDINGS:  None recent in chart  COGNITION: Overall cognitive  status: impaired   SENSATION: Unable to complete testing due to impaired cognition, pt unable to report if sensation impairment present, pt mother unsure   EDEMA:  Mom reports pt's feet do swell from time to time   LOWER EXTREMITY MMT:  *accuracy of testing likely impacted by pt difficulty following cues  MMT Right Eval Left Eval  Hip flexion 4+ 4+  Hip extension    Hip abduction 4+ 4+  Hip adduction 4+ 4+  Hip internal rotation    Hip external rotation    Knee flexion 4+ 4+  Knee extension 5 5  Ankle dorsiflexion    Ankle plantarflexion    Ankle inversion    Ankle eversion    (Blank rows = not tested)  TRANSFERS: HHA  tried in session, very unsteady, pt has difficulty with motor planning, little success with cues due to distraction, very unsafe Attempted in session with RW, pt unsteady, gait ataxic, continued significant difficulty with motor planning requiring up to min a + 2 and multi-modal cues to safely sit in chair.   FUNCTIONAL TESTS:  5 times sit to stand: 1 min 43 sec Timed up and go (TUG): 1 min 30 sec with RW 10 meter walk test: 0.25 m/s with RW extensive cuing to complete activity  PATIENT SURVEYS:  SIS-16 filled out by pt's mother, used as questions relevant to pt: 45                                                                                                                              TREATMENT DATE: 10/03/2023  Pt arrived to therapy session in transport chair.  Unless otherwise stated, CGA/min A was provided and gait belt donned in order to ensure pt safety throughout session.  Gait training 150 ft using RW with CGA progressed to consistent skilled min assist for balance and AD management as she fatigues.   Pt responded better to shortened commands, with  increased ability to stay within the walker when therapist cued close.  Pt, therapist, and father continued therapy in back hallway by security in order to minimize distractions  Pt continued to ambulate forward in the hallway (50') and then ambulate backwards (15'), however due to pt fatiguing with retro walking, pt needed the chair to be brought closer to her on multiple attempts.   Pt performed several attempts throughout the session and as noted in prior sessions, moved into widened gait when fatiguing.   Patient continues to remain easily distractible in clinic environment, requiring cuing to refocus attention on current task.   PATIENT EDUCATION: Education details: goal reassessment, indications, exercise technique Person educated: Patient and Parent Education method: Explanation, Demonstration, Tactile cues, and Verbal cues Education comprehension: returned demonstration, verbal cues required, tactile cues required, and needs further education  HOME EXERCISE PROGRAM: Access Code: [email or web address] URL: [email or web address] Date: 06/11/2023 Prepared by: Massie Dollar  Exercises - Standing March with Counter Support  - 1 x daily - 7  x weekly - 2 sets - 8 reps - Side Stepping with Counter Support  - 1 x daily - 7 x weekly - 2 sets - 4 reps - Sit to Stand with Armchair  - 1 x daily - 7 x weekly - 2 sets - 5 reps - Seated March  - 1 x daily - 7 x weekly - 3 sets - 10 reps  GOALS: Goals reviewed with patient? Yes   SHORT TERM GOALS: Target date: 07/11/2023  Patient will be independent in home exercise program to improve strength/mobility for better functional independence with ADLs. Baseline: Goal status: INITIAL   LONG TERM GOALS: Target date: 11/13/2023   1.  Patient will complete five times sit to stand test in < 35 seconds indicating an increased LE strength and improved balance. Baseline: previously 37 sec with UUE 04/19/22 today 05/30/23: 1 min 43 seconds; 6/3:  41 sec with improved technique (use of BUE on armrests);  08/21/2023= 38 sec with some distractibility and VC for hand placement 7:23: 39.06 (41.23, 36.89 sec) Goal status: PARTIALLY MET  2. Patient will demo ability to ambulate > 400 ft w/ LRAD versus hand held assist min assist to demonstrate improved household mobility and short community distances.  Baseline: 4/28: 137ft with RW. Mod assist for AD management to prevent veer to the R. 6/3: ~ 300 ft with RW, requires cuing throughout for proximity to RW particularly with turns, but this has improved compared to eval;  08/21/2023= Patient ambulated approx 300 feet with RW- Sill some min assist at times to navigate walker and VC to remain close to walker.  7/23: 348ft with RW and min assist for safety and moderate instruction for safety AD intermittently, especially with fatigue  Goal status: IN PROGRESS  3.  Patient will increase 10 meter walk test to >1.61m/s as to improve gait speed for better community ambulation and to reduce fall risk. Baseline: previously 0.36 m/s on 04/19/22 : 0.25 m/s;  07/10/23: 0. 24 m/s with RW;  08/15/2023= 0.39 m/s  with RW yet max VC for RW navigation.  7/23:Average Normal speed with RW: 0.25 m/s Average Normal speed with HHA: 0.51m/s Goal status: PROGRESSING  4.  Patient will reduce timed up and go (TUG) to <50 seconds to reduce fall risk and demonstrate improved transfer/gait ability Baseline: previously 91 sec with RW on 04/19/22; 05/30/23: 1 min 30 sec with RW 07/19/2023: 48.265 seconds using RW with skilled min A primarily for AD management with pt having difficulty turning with AD;  08/21/2023- 33 sec with HHA and 1 min 25 sec with RW (Max VC for safety with turning back to seat and to reach back safely)  7/23:42.18sec with RW and min assist for safety in turn; 40.35 sec with HHA and min assist for safety with turns.  Goal status: IN PROGRESS  5. Pt will exhibit correct and safe technique with stand>sit into a  standard chair or transport chair to reduce risk of falling.   Baseline: pt current technique unsafe, poor motor planning; 6/3: pt with improved technique, bring RW closer to chair prior to sitting; 08/21/2023- Patient able to perform with CGA yet inconsistent with cues for safety and consistency with hand placement.   Goal status: PARTIALLY MET   6. The pt will demonstrate at least a 15 point improvement on SIS-16 to indicate increased ease with ADLs. Baseline: 45 (filled out by pt's mother); 42; 08/21/2023= 46; 09/03/2023: 46/80 Goal status: ONGOING   ASSESSMENT:  CLINICAL IMPRESSION:  Pt responded well to the exercises and talked more in this session than in prior sessions with author.  Pt notably more talkative when father is around as he encourages her to speak instead of mumbling.  Pt ultimately has made improvements since the last time the author worked with the pt.  Pt still having difficulty with becoming distracted and with keeping the walker within cone of support.   Pt will continue to benefit from skilled therapy to address remaining deficits in order to improve overall QoL and return to PLOF.        OBJECTIVE IMPAIRMENTS: Abnormal gait, decreased activity tolerance, decreased balance, decreased coordination, decreased endurance, decreased knowledge of use of DME, decreased mobility, difficulty walking, decreased strength, decreased safety awareness, increased edema, impaired tone, improper body mechanics, and postural dysfunction.   ACTIVITY LIMITATIONS: carrying, lifting, bending, standing, squatting, stairs, transfers, bathing, dressing, locomotion level, and caring for others  PARTICIPATION LIMITATIONS: meal prep, cleaning, laundry, medication management, personal finances, driving, shopping, community activity, occupation, and yard work  PERSONAL FACTORS: Sex, Time since onset of injury/illness/exacerbation, and 1-2 comorbidities: per chart PMH includes CAD, HTN, ischemic  cardiomyopathy, STEMI, ventricular fibrillation, dysphagia, s/p ICD, apraxia  are also affecting patient's functional outcome.   REHAB POTENTIAL: Fair    CLINICAL DECISION MAKING: Evolving/moderate complexity  EVALUATION COMPLEXITY: High  PLAN:  PT FREQUENCY: 1-2x/week  PT DURATION: 12 weeks  PLANNED INTERVENTIONS: 97164- PT Re-evaluation, 97750- Physical Performance Testing, 97110-Therapeutic exercises, 97530- Therapeutic activity, 97112- Neuromuscular re-education, 97535- Self Care, [id number]- Manual therapy, [phone]- Gait training, [phone]- Orthotic Initial, [phone]- Orthotic/Prosthetic subsequent, [phone]- Canalith repositioning, Patient/Family education, Balance training, Stair training, Taping, Joint mobilization, Spinal mobilization, Vestibular training, DME instructions, Wheelchair mobility training, Cryotherapy, and Moist heat  PLAN FOR NEXT SESSION:  backwards gait training using RW with skilled assistance - perform in environment with limited distractions to allow improved motor learning Transfer training - learning sequence of AD management and stepping back to seat Gait training using RW in home-like environment Dynamic balance Reaching with use of Blaze Pods for tapping hands    Fonda Simpers, PT, DPT Physical Therapist - [facility]  10/03/23, 3:13 PM

## 2023-10-10 ENCOUNTER — Ambulatory Visit: Attending: Physical Medicine & Rehabilitation | Admitting: Physical Therapy

## 2023-10-10 DIAGNOSIS — R278 Other lack of coordination: Secondary | ICD-10-CM | POA: Insufficient documentation

## 2023-10-10 DIAGNOSIS — R262 Difficulty in walking, not elsewhere classified: Secondary | ICD-10-CM | POA: Diagnosis present

## 2023-10-10 DIAGNOSIS — G931 Anoxic brain damage, not elsewhere classified: Secondary | ICD-10-CM | POA: Insufficient documentation

## 2023-10-10 DIAGNOSIS — R2689 Other abnormalities of gait and mobility: Secondary | ICD-10-CM | POA: Insufficient documentation

## 2023-10-10 DIAGNOSIS — R269 Unspecified abnormalities of gait and mobility: Secondary | ICD-10-CM | POA: Diagnosis present

## 2023-10-10 DIAGNOSIS — R2681 Unsteadiness on feet: Secondary | ICD-10-CM | POA: Insufficient documentation

## 2023-10-10 DIAGNOSIS — M6281 Muscle weakness (generalized): Secondary | ICD-10-CM | POA: Diagnosis present

## 2023-10-10 DIAGNOSIS — R41841 Cognitive communication deficit: Secondary | ICD-10-CM | POA: Diagnosis present

## 2023-10-10 NOTE — Therapy (Signed)
 OUTPATIENT PHYSICAL THERAPY TREATMENT    Patient Name: Beverly [name] MRN: [id number] DOB:December 17, 1985, 38 y.o., female Today's Date: 10/10/2023  PCP: [facility] REFERRING PROVIDER: Babs Arthea DASEN, MD  END OF SESSION:   PT End of Session - 10/10/23 1105     Visit Number 26    Number of Visits 41    Date for PT Re-Evaluation 11/13/23    Authorization Type UHC Dual Complete; Medicaid secondary    Authorization Time Period 05/30/23-08/22/23    Progress Note Due on Visit 30    PT Start Time 1105    PT Stop Time 1145    PT Time Calculation (min) 40 min    Equipment Utilized During Treatment Gait belt    Activity Tolerance Patient tolerated treatment well    Behavior During Therapy Flat affect;Impulsive;WFL for tasks assessed/performed            Past Medical History:  Diagnosis Date   Brain injury [facility])    CAD (coronary artery disease)    HLD (hyperlipidemia)    Hypertension    last pregnancy   Ischemic cardiomyopathy    STEMI (ST elevation myocardial infarction) (HCC) 09/2019   SCAD with aneurysmal dilation of proximal LAD.   Vaginal Pap smear, abnormal    when she was 38yo   Ventricular fibrillation (HCC) 09/23/2019   Past Surgical History:  Procedure Laterality Date   CARDIAC CATHETERIZATION     IR GASTROSTOMY TUBE MOD SED  10/07/2019   IR GASTROSTOMY TUBE REMOVAL  07/02/2020   IR REPLACE G-TUBE SIMPLE WO FLUORO  09/28/2023   IR REPLC GASTRO/COLONIC TUBE PERCUT W/FLUORO  11/03/2021   IR REPLC GASTRO/COLONIC TUBE PERCUT W/FLUORO  03/09/2023   LEFT HEART CATH AND CORONARY ANGIOGRAPHY N/A 09/23/2019   Procedure: LEFT HEART CATH AND CORONARY ANGIOGRAPHY;  Surgeon: Mady [name], MD;  Location: [facility] INVASIVE CV LAB;  Service: Cardiovascular;  Laterality: N/A;   SUBQ ICD IMPLANT N/A 11/26/2020   Procedure: SUBQ ICD IMPLANT;  Surgeon: [name], MD;  Location: [facility] INVASIVE CV LAB;  Service: Cardiovascular;  Laterality: N/A;   Patient  Active Problem List   Diagnosis Date Noted   Apraxia 10/05/2021   S/P ICD (internal cardiac defibrillator) procedure 11/27/2020   Hx of Cardiac arrest (HCC) 11/26/2020   Abnormality of gait 03/16/2020   Sleep disturbance 03/16/2020   Oropharyngeal dysphagia 02/12/2020   Coronary artery disease involving native coronary artery of native heart without angina pectoris 12/12/2019   Ischemic cardiomyopathy 12/12/2019   Brain injury (HCC)    Prediabetes    Anoxic brain injury (HCC) 11/05/2019   Dysphagia 11/05/2019   Physical deconditioning 11/05/2019   Acute delirium 11/05/2019   Respiratory failure (HCC)    Encounter for central line placement    Ventricular fibrillation (HCC) 09/23/2019   Acute combined systolic and diastolic heart failure (HCC) 09/23/2019   Ventricular tachycardia, polymorphic (HCC) 09/23/2019   Pelvic pain affecting pregnancy 10/15/2015   Supervision of normal pregnancy in third trimester 09/28/2015   Poor weight gain of pregnancy 09/28/2015   Iron  deficiency anemia of pregnancy 09/01/2015   Increased BMI (body mass index) 07/29/2015    ONSET DATE: August 2021  REFERRING DIAG:  G93.1 (ICD-10-CM) - Anoxic brain injury (HCC)  R48.2 (ICD-10-CM) - Apraxia    THERAPY DIAG:   Muscle weakness (generalized)  Other lack of coordination  Anoxic brain injury (HCC)  Difficulty in walking, not elsewhere classified  Other abnormalities of gait and mobility  Unsteadiness on  feet  Abnormality of gait and mobility  Rationale for Evaluation and Treatment: Rehabilitation  SUBJECTIVE:                                                                                                                                                                                             SUBJECTIVE STATEMENT:   Patient reports she is doing fine. Denies pain. Denies falls since last visit. Father reports no updates since last session.  Pt accompanied by: self and father,  [place]  PERTINENT HISTORY:   Patient is a 38 year old with diagnois of anoxic brain injury. Pt known to PT clinic. Previously seen for outpatient services March 2024. Pt was receiving HH PT until October 2024 per her mother's report. [name] reports pt using HHA for ambulation, was using RW but she says was told not to do to difficulty with safe technique. Pt has fallen 1x in the past six months trying to ambulate on her own. Pt requires assistance with ADLs, including bathing and dressing. [name] reports pt never indicates any pain. Pt with severe aphasia so is unable to answer questions/pt's mom provides hx. She reports pt has impaired short-term memory, difficulty with stair climbing (has stair lift at home). She reports pt's BLE are weaker than they used to be. The pt is probably limited to <10 min with ambulation due to poor endurance. Pt reports her goal is to be able to use an AD to achieve mod-I mobility rather than relying on someone's HHA to ambulate.She was receiving outpatient PT until Feb. 2022 last year but ran out of visits. Patient has past medical history of Anoxic Brain Injury 09/2019, CAD, HLD, HTN, ISCHEMIC CARDIMYOPATHY, STEMI, V-FIB, ICD on 11/29/2020. Patient is currently living with mom. Mother reports she is abel to walk some with a walker with supervision but primarily ambulates with HHA of mother who is her guardian. Mother also reports she requires assist for all transfers and ADL's.   PAIN:  Are you having pain? No  PRECAUTIONS: Fall  WEIGHT BEARING RESTRICTIONS: No  FALLS: Has patient fallen in last 6 months? Yes. Number of falls 1  LIVING ENVIRONMENT: Lives with: lives with their family, mother Lives in: House/apartment Stairs: has stairs but stair lift installed, pt using lift Has following equipment at home: Environmental consultant - 2 wheeled, Wheelchair (manual), shower chair, and Grab bars, pt uses stair lift  PLOF: Needs assistance with ADLs  PATIENT GOALS:  balance, walking, and pt states basketball  OBJECTIVE:  Note: Objective measures were completed at Evaluation unless otherwise noted.  DIAGNOSTIC FINDINGS:  None recent in chart  COGNITION:  Overall cognitive status: impaired   SENSATION: Unable to complete testing due to impaired cognition, pt unable to report if sensation impairment present, pt mother unsure   EDEMA:  Mom reports pt's feet do swell from time to time   LOWER EXTREMITY MMT:  *accuracy of testing likely impacted by pt difficulty following cues  MMT Right Eval Left Eval  Hip flexion 4+ 4+  Hip extension    Hip abduction 4+ 4+  Hip adduction 4+ 4+  Hip internal rotation    Hip external rotation    Knee flexion 4+ 4+  Knee extension 5 5  Ankle dorsiflexion    Ankle plantarflexion    Ankle inversion    Ankle eversion    (Blank rows = not tested)  TRANSFERS: HHA  tried in session, very unsteady, pt has difficulty with motor planning, little success with cues due to distraction, very unsafe Attempted in session with RW, pt unsteady, gait ataxic, continued significant difficulty with motor planning requiring up to min a + 2 and multi-modal cues to safely sit in chair.   FUNCTIONAL TESTS:  5 times sit to stand: 1 min 43 sec Timed up and go (TUG): 1 min 30 sec with RW 10 meter walk test: 0.25 m/s with RW extensive cuing to complete activity  PATIENT SURVEYS:  SIS-16 filled out by pt's mother, used as questions relevant to pt: 45                                                                                                                              TREATMENT DATE: 10/10/2023  Pt arrived to therapy session in transport chair.  Unless otherwise stated, CGA/min A was provided and gait belt donned in order to ensure pt safety throughout session.  Focus of therapy session to continue addressing improved AD management in home environment as patient not currently using RW in home due to the difficulty managing  AD in smaller spaces.  L stand pivot transport chair>green chair using R HHA with min A for balance - continues to have greater challenge stepping R foot back compared to L.   Gait training 139ft using RW with CGA progressed to intermittent light min assist for balance and AD management Therapist providing primarily visual cuing of pointing for her to step back into the AD to keep cuing simple and to wean pt from needing external cuing  Increased tendency of L foot kicking back L leg of RW today with pt stepping L LE outside of AD more frequently A few instances of poor AD management causing imbalance when she becomes distracted  Pt starts to have instances of wanting to pick up the AD to move it or re-center it requiring cuing to avoid this becoming a habit  Dynamic gait training in kitchen using RW to work on improving AD management in home environment via having pt walk around to pick-up external object on counter Pt has significant difficulty problem solving  how to turn AD to navigate out of the corner of the kitchen today Pt frequently reaches for external targets prior to stepping up close to them and turning AD to where it can support her  Demos impaired awareness/problem solving of how to use AD for support Had patient pick-up hedgehogs (would use different external target next time as pt doesn't like the texture of these making it more difficult to pick-up) Requires up to min A for balance and AD management  Pt continues to have difficulty motor planning stepping backwards to sit in chair, specifically while keeping AD close to her, at end of gait.   Attempted ~68ft forward/backwards gait using RW to work on posterior stepping; however, pt spontaneously picks-up walker to turn around rather than walking backwards Requires mod AD for AD management and min A for balance   Transitioned to forward/backwards gait in kitchen using RW ~50ft each direction (where there isn't space to turn  around due to being next to cabinets) with goal of improving AD management, specifically when approaching cabinet to reach external target and during backwards gait - x5 reps Continues to require mod A for AD management when navigating backwards  Attempted cuing of step, step, pull vs step, step, walker to improve patients sequencing of bringing AD backwards after 1 step with each foot because pt frequently will keep stepping back leaving AD out in front of her causing her to flex forward; otherwise, pt ends up tipping/flipping the RW back onto it's back legs, losing her support  No significant improvement noted in stepping back towards chair with improved AD management during session, but did seem to respond well to cue of getting the back of her legs to touch the chair prior to sitting, potentially because this is the cue her father gives her.   Patient continues to remain easily distractible in clinic environment, requiring cuing to refocus attention on current task, and relocating treatment area to less distracting area.  PATIENT EDUCATION: Education details: goal reassessment, indications, exercise technique Person educated: Patient and Parent Education method: Explanation, Demonstration, Tactile cues, and Verbal cues Education comprehension: returned demonstration, verbal cues required, tactile cues required, and needs further education  HOME EXERCISE PROGRAM: Access Code: [email or web address] URL: [email or web address] Date: 06/11/2023 Prepared by: Massie Dollar  Exercises - Standing March with Counter Support  - 1 x daily - 7 x weekly - 2 sets - 8 reps - Side Stepping with Counter Support  - 1 x daily - 7 x weekly - 2 sets - 4 reps - Sit to Stand with Armchair  - 1 x daily - 7 x weekly - 2 sets - 5 reps - Seated March  - 1 x daily - 7 x weekly - 3 sets - 10 reps  GOALS: Goals reviewed with patient? Yes   SHORT TERM GOALS: Target date: 07/11/2023  Patient will be  independent in home exercise program to improve strength/mobility for better functional independence with ADLs. Baseline: Goal status: INITIAL   LONG TERM GOALS: Target date: 11/13/2023   1.  Patient will complete five times sit to stand test in < 35 seconds indicating an increased LE strength and improved balance. Baseline: previously 37 sec with UUE 04/19/22 today 05/30/23: 1 min 43 seconds; 6/3: 41 sec with improved technique (use of BUE on armrests);  08/21/2023= 38 sec with some distractibility and VC for hand placement 7:23: 39.06 (41.23, 36.89 sec) Goal status: PARTIALLY MET  2. Patient will demo ability to ambulate > 400 ft  w/ LRAD versus hand held assist min assist to demonstrate improved household mobility and short community distances.  Baseline: 4/28: 147ft with RW. Mod assist for AD management to prevent veer to the R. 6/3: ~ 300 ft with RW, requires cuing throughout for proximity to RW particularly with turns, but this has improved compared to eval;  08/21/2023= Patient ambulated approx 300 feet with RW- Sill some min assist at times to navigate walker and VC to remain close to walker.  7/23: 326ft with RW and min assist for safety and moderate instruction for safety AD intermittently, especially with fatigue  Goal status: IN PROGRESS  3.  Patient will increase 10 meter walk test to >1.48m/s as to improve gait speed for better community ambulation and to reduce fall risk. Baseline: previously 0.36 m/s on 04/19/22 : 0.25 m/s;  07/10/23: 0. 24 m/s with RW;  08/15/2023= 0.39 m/s  with RW yet max VC for RW navigation.  7/23:Average Normal speed with RW: 0.25 m/s Average Normal speed with HHA: 0.73m/s Goal status: PROGRESSING  4.  Patient will reduce timed up and go (TUG) to <50 seconds to reduce fall risk and demonstrate improved transfer/gait ability Baseline: previously 91 sec with RW on 04/19/22; 05/30/23: 1 min 30 sec with RW 07/19/2023: 48.265 seconds using RW with skilled min A  primarily for AD management with pt having difficulty turning with AD;  08/21/2023- 33 sec with HHA and 1 min 25 sec with RW (Max VC for safety with turning back to seat and to reach back safely)  7/23:42.18sec with RW and min assist for safety in turn; 40.35 sec with HHA and min assist for safety with turns.  Goal status: IN PROGRESS  5. Pt will exhibit correct and safe technique with stand>sit into a standard chair or transport chair to reduce risk of falling.   Baseline: pt current technique unsafe, poor motor planning; 6/3: pt with improved technique, bring RW closer to chair prior to sitting; 08/21/2023- Patient able to perform with CGA yet inconsistent with cues for safety and consistency with hand placement.   Goal status: PARTIALLY MET   6. The pt will demonstrate at least a 15 point improvement on SIS-16 to indicate increased ease with ADLs. Baseline: 45 (filled out by pt's mother); 42; 08/21/2023= 46; 09/03/2023: 46/80 Goal status: ONGOING   ASSESSMENT:  CLINICAL IMPRESSION:  Patient arrives motivated to participate in therapy session. Therapy session continued to focus on gait training using RW in home environment settings because patient is not currently using AD in the home due to poor AD management. Patient continues to require HHA from another person to safely ambulate in home. Gait training in kitchen using RW today with pt demonstrating significantly impaired problem solving of how to use RW to support her when navigating through small spaces, especially when turning in small spaces. Patient also demos impaired awareness of how the AD can support her when she becomes off balance. Patient continues to have tendency to sit too quickly when stepping back towards seat without pulling AD back close to her before initiating sitting; however, is doing better stepping her feet back towards the chair. Patient will continue to benefit from gait training using RW as pt is showing gradual  improvements, but requires increased time for carryover due to cognitive impairments. Pt will continue to benefit from skilled therapy to address remaining deficits in order to improve overall QoL and return to PLOF.        OBJECTIVE IMPAIRMENTS: Abnormal gait,  decreased activity tolerance, decreased balance, decreased coordination, decreased endurance, decreased knowledge of use of DME, decreased mobility, difficulty walking, decreased strength, decreased safety awareness, increased edema, impaired tone, improper body mechanics, and postural dysfunction.   ACTIVITY LIMITATIONS: carrying, lifting, bending, standing, squatting, stairs, transfers, bathing, dressing, locomotion level, and caring for others  PARTICIPATION LIMITATIONS: meal prep, cleaning, laundry, medication management, personal finances, driving, shopping, community activity, occupation, and yard work  PERSONAL FACTORS: Sex, Time since onset of injury/illness/exacerbation, and 1-2 comorbidities: per chart PMH includes CAD, HTN, ischemic cardiomyopathy, STEMI, ventricular fibrillation, dysphagia, s/p ICD, apraxia  are also affecting patient's functional outcome.   REHAB POTENTIAL: Fair    CLINICAL DECISION MAKING: Evolving/moderate complexity  EVALUATION COMPLEXITY: High  PLAN:  PT FREQUENCY: 1-2x/week  PT DURATION: 12 weeks  PLANNED INTERVENTIONS: 97164- PT Re-evaluation, 97750- Physical Performance Testing, 97110-Therapeutic exercises, 97530- Therapeutic activity, 97112- Neuromuscular re-education, 97535- Self Care, [id number]- Manual therapy, [phone]- Gait training, [phone]- Orthotic Initial, [phone]- Orthotic/Prosthetic subsequent, [phone]- Canalith repositioning, Patient/Family education, Balance training, Stair training, Taping, Joint mobilization, Spinal mobilization, Vestibular training, DME instructions, Wheelchair mobility training, Cryotherapy, and Moist heat  PLAN FOR NEXT SESSION:  backwards gait training using RW with  skilled assistance - perform in environment with limited distractions to allow improved motor learning Transfer training - learning sequence of AD management and stepping back to seat Gait training using RW in home-like environment Dynamic balance Reaching with use of Blaze Pods for tapping hands     Tyronza Happe, PT, DPT, NCS, CSRS Physical Therapist - [facility]  [facility]  12:48 PM 10/10/23

## 2023-10-15 ENCOUNTER — Ambulatory Visit

## 2023-10-15 ENCOUNTER — Ambulatory Visit: Admitting: Occupational Therapy

## 2023-10-15 DIAGNOSIS — M6281 Muscle weakness (generalized): Secondary | ICD-10-CM

## 2023-10-15 DIAGNOSIS — R262 Difficulty in walking, not elsewhere classified: Secondary | ICD-10-CM

## 2023-10-15 DIAGNOSIS — R269 Unspecified abnormalities of gait and mobility: Secondary | ICD-10-CM

## 2023-10-15 DIAGNOSIS — R41841 Cognitive communication deficit: Secondary | ICD-10-CM

## 2023-10-15 DIAGNOSIS — R2681 Unsteadiness on feet: Secondary | ICD-10-CM

## 2023-10-15 DIAGNOSIS — G931 Anoxic brain damage, not elsewhere classified: Secondary | ICD-10-CM

## 2023-10-15 DIAGNOSIS — R278 Other lack of coordination: Secondary | ICD-10-CM

## 2023-10-15 DIAGNOSIS — R2689 Other abnormalities of gait and mobility: Secondary | ICD-10-CM

## 2023-10-15 NOTE — Therapy (Signed)
 Occupational Therapy Progress/Recertification Note  Dates of reporting period  07/19/2023   to   10/15/23   Patient Name: Beverly [name] MRN: [id number] DOB:1985/02/19, 38 y.o., female Today's Date: 10/15/2023   REFERRING PROVIDER: Babs Hussar, MD  END OF SESSION:  OT End of Session - 10/15/23 1320     Visit Number 10    Number of Visits 12    Date for OT Re-Evaluation 01/07/24    OT Start Time 1315    OT Stop Time 1400    OT Time Calculation (min) 45 min    Activity Tolerance Patient tolerated treatment well    Behavior During Therapy Flat affect;Impulsive;WFL for tasks assessed/performed           Past Medical History:  Diagnosis Date   Brain injury [facility])    CAD (coronary artery disease)    HLD (hyperlipidemia)    Hypertension    last pregnancy   Ischemic cardiomyopathy    STEMI (ST elevation myocardial infarction) (HCC) 09/2019   SCAD with aneurysmal dilation of proximal LAD.   Vaginal Pap smear, abnormal    when she was 38yo   Ventricular fibrillation (HCC) 09/23/2019   Past Surgical History:  Procedure Laterality Date   CARDIAC CATHETERIZATION     IR GASTROSTOMY TUBE MOD SED  10/07/2019   IR GASTROSTOMY TUBE REMOVAL  07/02/2020   IR REPLACE G-TUBE SIMPLE WO FLUORO  09/28/2023   IR REPLC GASTRO/COLONIC TUBE PERCUT W/FLUORO  11/03/2021   IR REPLC GASTRO/COLONIC TUBE PERCUT W/FLUORO  03/09/2023   LEFT HEART CATH AND CORONARY ANGIOGRAPHY N/A 09/23/2019   Procedure: LEFT HEART CATH AND CORONARY ANGIOGRAPHY;  Surgeon: Mady [name], MD;  Location: [facility] INVASIVE CV LAB;  Service: Cardiovascular;  Laterality: N/A;   SUBQ ICD IMPLANT N/A 11/26/2020   Procedure: SUBQ ICD IMPLANT;  Surgeon: [name], MD;  Location: [facility] INVASIVE CV LAB;  Service: Cardiovascular;  Laterality: N/A;   Patient Active Problem List   Diagnosis Date Noted   Apraxia 10/05/2021   S/P ICD (internal cardiac defibrillator) procedure 11/27/2020   Hx of Cardiac arrest (HCC) 11/26/2020    Abnormality of gait 03/16/2020   Sleep disturbance 03/16/2020   Oropharyngeal dysphagia 02/12/2020   Coronary artery disease involving native coronary artery of native heart without angina pectoris 12/12/2019   Ischemic cardiomyopathy 12/12/2019   Brain injury (HCC)    Prediabetes    Anoxic brain injury (HCC) 11/05/2019   Dysphagia 11/05/2019   Physical deconditioning 11/05/2019   Acute delirium 11/05/2019   Respiratory failure (HCC)    Encounter for central line placement    Ventricular fibrillation (HCC) 09/23/2019   Acute combined systolic and diastolic heart failure (HCC) 09/23/2019   Ventricular tachycardia, polymorphic (HCC) 09/23/2019   Pelvic pain affecting pregnancy 10/15/2015   Supervision of normal pregnancy in third trimester 09/28/2015   Poor weight gain of pregnancy 09/28/2015   Iron  deficiency anemia of pregnancy 09/01/2015   Increased BMI (body mass index) 07/29/2015   ONSET DATE: 09/23/19  REFERRING DIAG: Anoxic Brain Injury  THERAPY DIAG:  Muscle weakness (generalized)  Rationale for Evaluation and Treatment: Rehabilitation  SUBJECTIVE:  SUBJECTIVE STATEMENT:  Pt.'s mother reports that she got up, and went to use the bathroom by herself in the middle of the night, reporting that she thankfully did not fall. Pt accompanied by:  Father  PERTINENT HISTORY:  Pt. Is a 38 y.o. female with a history of severe aphasia  from a Cardiac Arrest leading to Anoxic Brain Injury  on 09/23/19 at 2 weeks postpartum. Pt. Currently hs a G-Tube in place. PMHx includes: CAD, HLD, HTN, Ischemic cardiomyopathy, STEMI, V-Fib, and ICD on 11/29/20.  PRECAUTIONS: None  WEIGHT BEARING RESTRICTIONS: No  PAIN:  Are you having pain? No  FALLS: Has patient fallen in last 6 months? No  LIVING ENVIRONMENT: Lives with: mother Lives in: House/apartment Stairs:  two story; No steps to enter. 10 steps inside, and has a chair lift Has following equipment at home: Walker - 2 wheeled and  Wheelchair (manual), stair chair lift  PLOF: Independent  PATIENT GOALS: To improve bilateral hand function for increased engagement in ADLs, and IADL tasks.  OBJECTIVE:  Note: Objective measures were completed at Evaluation unless otherwise noted.  HAND DOMINANCE: Left  ADLs: Per father report  Transfers/ambulation related to ADLs: Eating:  Dependent/feeding tube Grooming: Independent washing face, assist with hair care  UB Dressing: Assist with donning shirt LB Dressing: Assist with donning LE clothing  Toileting: Assist with transfers, toilet hygiene skills, and clothing negotiation Bathing:  Unknown  IADLs: Per father report Shopping: Total A Light housekeeping: Total A Meal Prep: Total A Medication management:   Total A Financial management: Total A Handwriting: 50% legible printing name only using 1 inch letters.  MOBILITY STATUS: Needs Assist: using walker  POSTURE COMMENTS:   Sitting balance:  fair  ACTIVITY TOLERANCE: Activity tolerance:  Fair   FUNCTIONAL OUTCOME MEASURES: TBD  UPPER EXTREMITY ROM:    Active ROM Right Eval WFL Left Eval [facility]  Shoulder flexion    Shoulder abduction    Shoulder adduction    Shoulder extension    Shoulder internal rotation    Shoulder external rotation    Elbow flexion    Elbow extension    Wrist flexion    Wrist extension    Wrist ulnar deviation    Wrist radial deviation    Wrist pronation    Wrist supination    (Blank rows = not tested)  UPPER EXTREMITY MMT:     MMT Right eval Right 10/15/23 Left eval Left 10/15/23  Shoulder flexion 3/5 4-/5 3+/5 4-/5  Shoulder abduction 3/5 4-/5 3+/5 4-/5  Shoulder adduction      Shoulder extension      Shoulder internal rotation      Shoulder external rotation      Middle trapezius      Lower trapezius      Elbow flexion 3/5 4-/5 3+/5 4-/5  Elbow extension 3/5 4+/5 3+/5 4-/5  Wrist flexion      Wrist extension 3/5 3+/5 3+/5 4-/5  Wrist ulnar deviation       Wrist radial deviation      Wrist pronation      Wrist supination      (Blank rows = not tested)  HAND FUNCTION: Grip strength: Right: 10 lbs; Left: 18 lbs, Lateral pinch: Right: 5 lbs, Left: 7 lbs.  10/15/23:  Grip strength: Right: 15 lbs; Left: 25 lbs, Lateral pinch: Right: 4 lbs, Left: 11 lbs.   COORDINATION:  Eval:  Nine Hole Peg test: Pt. was able to formulate isolated 2nd digit extension in anticipation for grasping the horizontal pegs.  10/15/2023:   9 Hole Peg Test:   -Pt. was unable to grasp the pegs from a horizontal position, and transition them to a vertical position in preparation for placing them into the pegboard.   -To remove pegs only: Right: 1 min. & 45 sec.;  Left: 1 min. & 16 sec.  SENSATION: Light touch intact  EDEMA: N/A   COGNITION: Overall cognitive status: impaired   VISION:  Baseline:  wears glasses.   VISION ASSESSMENT: To be further assessed in functional context  PERCEPTION: WFL  PRAXIS: WFL                                                                                                       TREATMENT DATE: 10/15/2023  Measurements were obtained, and goals were reviewed with the Pt. and caregiver.   PATIENT EDUCATION: Education details:  Goals, POC, measurements, UBE UE strengthening Person educated: Parent Education method: Explanation and Verbal cues Education comprehension: verbalized understanding  HOME EXERCISE PROGRAM:  -increasing opportunities for engaging the bilateral hands during ADL tasks.  GOALS: Goals reviewed with patient? Yes  SHORT TERM GOALS: Target date: 11/26/2023  Pt. Will require supervision HEPs for BUE strengthening  Baseline: Eval: No current HEP. Goal status: INITIAL   LONG TERM GOALS: Target date: 01/07/2024   Pt. Will increase BUE strength by 2 mm grades to assist with ADLs, and IADLs. Baseline: 10/15/23: RUE: Right shoulder flexion:  4-/5, abduction: 4-/5, elbow flexion: 4-/5, elbow  extension: 4+/5, wrist extension: 3+/5; LUE: 4-/5 overall Eval:  RUE: 3/5 overall, LUE: 3+/5 overall Goal status: Progressing/Ongoing  2.  Pt. will improve bilateral grip strength by 5# to be able to securely hold items in her hands. Baseline: 10/15/23: Grip Right: 15#, Left: 25# Eval: Grip Right: 10#, Left: 18# Goal status: Progressing/Ongoing  3.  Pt. Will improve bilateral lateral pinch strength by 3# to assist with grooming tasks. Baseline: 10/15/23: lateral pinch strength:  R: 4#, L: 11# Eval: R: 4#, L: 7# Goal status: Progressing/Ongoing  4.  Pt. Will improve bilateral hand FMC grasping, and placing one peg in the 9 hole peg test in preparation for improved manipulation of small objects. Baseline: 10/15/23: 9 Hole Peg Test: Removing pegs only: R: 1 min. & 45 sec.; L: 1 min. &16 sec. Eval: Pt. is able to initiate formulating isolated 2nd digit extension in preparation for grasping small objects. Goal status: Progressing/Ongoing  5.  Pt.  Will write her name her full name with 75% legibility. Baseline: 10/15/23: Continue Eval: 50% legible printing name only using 1 inch letters. Goal status: Ongoing  6. Pt.  Will demonstrate modified techniques for ADLs with minA.  Baseline: 10/15/23: requires mod/maxA Eval: Does not currently use adaptive, or modified techniques during ADLS.  Goal status: ongoing  7.  Pt will brush teeth with set up and mod A for thoroughness.  Baseline: 10/15/23: MaxA 08/29/23: Dep  Goal status:Ongoing  8.  Pt will don tshirt with min A.  Baseline: 10/15/23: ModA per mother. 08/29/23: Max-dep; mother reports pt gets her shirt turned around and often struggles to pull over her head  Goal status: Ongoing  9. Pt will spit saliva into cup using cup with handle and wipe mouth with washcloth/hand towel with supv-min A.  Baseline: 10/15/23: Mod-maxA 08/29/23: Max-dep; pt squeezes plastic cup too hard/paper cup slips from hands.  Pt struggles to wipe mouth thoroughly with napkin  or tissue.  Goal status: Ongoing   ASSESSMENT:  CLINICAL IMPRESSION:  Measurements were obtained, ans goals were reviewed with the Pt. and caregiver. Pt. has made progress overall with BUE strength, grip strength, pinch strength, and FMC skills. Pt. is improving with ADL tasks requiring less assist for bathing tasks using modified techniques, brushing teeth, donning a T-shirt, and initiating discarding saliva into a cup and wiping her mouth. Pt. continues to require heavy step-by-step verbal, and tactile cuing with increased time to initiate each task. Pt. continues to work on improving BUE functioning, and ADL/IADL tasks. Pt. continues to benefit from OT services to maximize pt's participation in ADLs, increase ADL safety, and provide caregiver education as needed to ease caregiver assist with ADLs.     PERFORMANCE DEFICITS: in functional skills including ADLs, IADLs, coordination, dexterity, proprioception, ROM, strength, pain, Fine motor control, and UE functional use, cognitive skills including attention, memory, problem solving, and safety awareness, and psychosocial skills including coping strategies, environmental adaptation, and routines and behaviors.   IMPAIRMENTS: are limiting patient from ADLs, IADLs, and leisure.   CO-MORBIDITIES: may have co-morbidities  that affects occupational performance. Patient will benefit from skilled OT to address above impairments and improve overall function.  MODIFICATION OR ASSISTANCE TO COMPLETE EVALUATION: Min-Moderate modification of tasks or assist with assess necessary to complete an evaluation.  OT OCCUPATIONAL PROFILE AND HISTORY: Detailed assessment: Review of records and additional review of physical, cognitive, psychosocial history related to current functional performance.  CLINICAL DECISION MAKING: Moderate - several treatment options, min-mod task modification necessary  REHAB POTENTIAL: Good  EVALUATION COMPLEXITY:  Moderate    PLAN:  OT FREQUENCY: 1x/week  OT DURATION: 12 weeks  PLANNED INTERVENTIONS: 97535 self care/ADL training, 02889 therapeutic exercise, 97530 therapeutic activity, 97112 neuromuscular re-education, 97140 manual therapy, 97018 paraffin, 02989 moist heat, 97760 Orthotic Initial, 97761 Prosthetic Initial, 97763 Orthotic/Prosthetic subsequent, functional mobility training, visual/perceptual remediation/compensation, energy conservation, patient/family education, and DME and/or AE instructions  RECOMMENDED OTHER SERVICES: PT/ST  CONSULTED AND AGREED WITH PLAN OF CARE: Patient  PLAN FOR NEXT SESSION: Treatment  Richardson Otter, MS, OTR/L   10/15/23

## 2023-10-15 NOTE — Therapy (Signed)
 OUTPATIENT PHYSICAL THERAPY TREATMENT    Patient Name: Beverly [name] MRN: [id number] DOB:06-03-1985, 38 y.o., female Today's Date: 10/15/2023  PCP: [facility] REFERRING PROVIDER: Babs Arthea DASEN, MD  END OF SESSION:   PT End of Session - 10/15/23 1550     Visit Number 27    Number of Visits 41    Date for PT Re-Evaluation 11/13/23    Authorization Type UHC Dual Complete; Medicaid secondary    Authorization Time Period 05/30/23-08/22/23    Progress Note Due on Visit 30    PT Start Time 1402    PT Stop Time 1445    PT Time Calculation (min) 43 min    Equipment Utilized During Treatment Gait belt    Activity Tolerance Patient tolerated treatment well    Behavior During Therapy Flat affect;Impulsive;WFL for tasks assessed/performed            Past Medical History:  Diagnosis Date   Brain injury [facility])    CAD (coronary artery disease)    HLD (hyperlipidemia)    Hypertension    last pregnancy   Ischemic cardiomyopathy    STEMI (ST elevation myocardial infarction) (HCC) 09/2019   SCAD with aneurysmal dilation of proximal LAD.   Vaginal Pap smear, abnormal    when she was 38yo   Ventricular fibrillation (HCC) 09/23/2019   Past Surgical History:  Procedure Laterality Date   CARDIAC CATHETERIZATION     IR GASTROSTOMY TUBE MOD SED  10/07/2019   IR GASTROSTOMY TUBE REMOVAL  07/02/2020   IR REPLACE G-TUBE SIMPLE WO FLUORO  09/28/2023   IR REPLC GASTRO/COLONIC TUBE PERCUT W/FLUORO  11/03/2021   IR REPLC GASTRO/COLONIC TUBE PERCUT W/FLUORO  03/09/2023   LEFT HEART CATH AND CORONARY ANGIOGRAPHY N/A 09/23/2019   Procedure: LEFT HEART CATH AND CORONARY ANGIOGRAPHY;  Surgeon: Mady [name], MD;  Location: [facility] INVASIVE CV LAB;  Service: Cardiovascular;  Laterality: N/A;   SUBQ ICD IMPLANT N/A 11/26/2020   Procedure: SUBQ ICD IMPLANT;  Surgeon: [name], MD;  Location: [facility] INVASIVE CV LAB;  Service: Cardiovascular;  Laterality: N/A;   Patient  Active Problem List   Diagnosis Date Noted   Apraxia 10/05/2021   S/P ICD (internal cardiac defibrillator) procedure 11/27/2020   Hx of Cardiac arrest (HCC) 11/26/2020   Abnormality of gait 03/16/2020   Sleep disturbance 03/16/2020   Oropharyngeal dysphagia 02/12/2020   Coronary artery disease involving native coronary artery of native heart without angina pectoris 12/12/2019   Ischemic cardiomyopathy 12/12/2019   Brain injury (HCC)    Prediabetes    Anoxic brain injury (HCC) 11/05/2019   Dysphagia 11/05/2019   Physical deconditioning 11/05/2019   Acute delirium 11/05/2019   Respiratory failure (HCC)    Encounter for central line placement    Ventricular fibrillation (HCC) 09/23/2019   Acute combined systolic and diastolic heart failure (HCC) 09/23/2019   Ventricular tachycardia, polymorphic (HCC) 09/23/2019   Pelvic pain affecting pregnancy 10/15/2015   Supervision of normal pregnancy in third trimester 09/28/2015   Poor weight gain of pregnancy 09/28/2015   Iron  deficiency anemia of pregnancy 09/01/2015   Increased BMI (body mass index) 07/29/2015    ONSET DATE: August 2021  REFERRING DIAG:  G93.1 (ICD-10-CM) - Anoxic brain injury (HCC)  R48.2 (ICD-10-CM) - Apraxia    THERAPY DIAG:   Muscle weakness (generalized)  Other lack of coordination  Anoxic brain injury (HCC)  Difficulty in walking, not elsewhere classified  Other abnormalities of gait and mobility  Unsteadiness on  feet  Abnormality of gait and mobility  Cognitive communication deficit  Rationale for Evaluation and Treatment: Rehabilitation  SUBJECTIVE:                                                                                                                                                                                             SUBJECTIVE STATEMENT:   Pt accompanied by mother upon arrival.  Pt much less talkative than in prior session and pt did not enjoy having to use her voice instead of  nodding or mumbling.    Pt accompanied by: self and father, [place]  PERTINENT HISTORY:   Patient is a 38 year old with diagnois of anoxic brain injury. Pt known to PT clinic. Previously seen for outpatient services March 2024. Pt was receiving HH PT until October 2024 per her mother's report. [name] reports pt using HHA for ambulation, was using RW but she says was told not to do to difficulty with safe technique. Pt has fallen 1x in the past six months trying to ambulate on her own. Pt requires assistance with ADLs, including bathing and dressing. [name] reports pt never indicates any pain. Pt with severe aphasia so is unable to answer questions/pt's mom provides hx. She reports pt has impaired short-term memory, difficulty with stair climbing (has stair lift at home). She reports pt's BLE are weaker than they used to be. The pt is probably limited to <10 min with ambulation due to poor endurance. Pt reports her goal is to be able to use an AD to achieve mod-I mobility rather than relying on someone's HHA to ambulate.She was receiving outpatient PT until Feb. 2022 last year but ran out of visits. Patient has past medical history of Anoxic Brain Injury 09/2019, CAD, HLD, HTN, ISCHEMIC CARDIMYOPATHY, STEMI, V-FIB, ICD on 11/29/2020. Patient is currently living with mom. Mother reports she is abel to walk some with a walker with supervision but primarily ambulates with HHA of mother who is her guardian. Mother also reports she requires assist for all transfers and ADL's.   PAIN:  Are you having pain? No  PRECAUTIONS: Fall  WEIGHT BEARING RESTRICTIONS: No  FALLS: Has patient fallen in last 6 months? Yes. Number of falls 1  LIVING ENVIRONMENT: Lives with: lives with their family, mother Lives in: House/apartment Stairs: has stairs but stair lift installed, pt using lift Has following equipment at home: Environmental consultant - 2 wheeled, Wheelchair (manual), shower chair, and Grab bars, pt uses stair  lift  PLOF: Needs assistance with ADLs  PATIENT GOALS: balance, walking, and pt states basketball  OBJECTIVE:  Note: Objective measures were  completed at Evaluation unless otherwise noted.  DIAGNOSTIC FINDINGS:  None recent in chart  COGNITION: Overall cognitive status: impaired   SENSATION: Unable to complete testing due to impaired cognition, pt unable to report if sensation impairment present, pt mother unsure   EDEMA:  Mom reports pt's feet do swell from time to time   LOWER EXTREMITY MMT:  *accuracy of testing likely impacted by pt difficulty following cues  MMT Right Eval Left Eval  Hip flexion 4+ 4+  Hip extension    Hip abduction 4+ 4+  Hip adduction 4+ 4+  Hip internal rotation    Hip external rotation    Knee flexion 4+ 4+  Knee extension 5 5  Ankle dorsiflexion    Ankle plantarflexion    Ankle inversion    Ankle eversion    (Blank rows = not tested)  TRANSFERS: HHA  tried in session, very unsteady, pt has difficulty with motor planning, little success with cues due to distraction, very unsafe Attempted in session with RW, pt unsteady, gait ataxic, continued significant difficulty with motor planning requiring up to min a + 2 and multi-modal cues to safely sit in chair.   FUNCTIONAL TESTS:  5 times sit to stand: 1 min 43 sec Timed up and go (TUG): 1 min 30 sec with RW 10 meter walk test: 0.25 m/s with RW extensive cuing to complete activity  PATIENT SURVEYS:  SIS-16 filled out by pt's mother, used as questions relevant to pt: 45                                                                                                                              TREATMENT DATE: 10/15/2023  Pt arrived to therapy session in transport chair.  Unless otherwise stated, CGA/min A was provided and gait belt donned in order to ensure pt safety throughout session.  Focus of therapy session to continue addressing improved AD management in home environment as patient not  currently using RW in home due to the difficulty managing AD in smaller spaces.  TherAct: To improve functional movements patterns for everyday tasks  L stand pivot transport chair>green chair using R HHA with min A for balance - continues to have greater challenge stepping R foot back compared to L.   Seated soccer kicks with 3# AW donned, 30 sec rounds x2  Seated ball throw with therapist, x10 total throws with pt needing increased time to gather ball and attempt to throw  Standing soccer kicks with mom in front assisting, several minutes spent with this task.  Pt used R HHA and L UE assist on handrail to perform kicks and maintain balance.  ModA utilized at times to keep pt upright when taking too large of a step.   Gait:  Gait training 146ft using RW with CGA progressed to intermittent light min assist for balance and AD management  Forward/Retro ambulation with handrail for balance on the R UE and L HHA,  x6 total attempts   TherEx: To improve strength, endurance, mobility, and function of specific targeted muscle groups or improve joint range of motion or improve muscle flexibility  Seated LAQ with 3# AW donned, 2x10 each LE Seated marches with 3# AW donned, 2x10 each Le    Patient continues to remain easily distractible in clinic environment, requiring cuing to refocus attention on current task, and relocating treatment area to less distracting area.  PATIENT EDUCATION: Education details: goal reassessment, indications, exercise technique Person educated: Patient and Parent Education method: Explanation, Demonstration, Tactile cues, and Verbal cues Education comprehension: returned demonstration, verbal cues required, tactile cues required, and needs further education  HOME EXERCISE PROGRAM: Access Code: [email or web address] URL: [email or web address] Date: 06/11/2023 Prepared by: Massie Dollar  Exercises - Standing March with Counter Support  - 1 x daily - 7 x  weekly - 2 sets - 8 reps - Side Stepping with Counter Support  - 1 x daily - 7 x weekly - 2 sets - 4 reps - Sit to Stand with Armchair  - 1 x daily - 7 x weekly - 2 sets - 5 reps - Seated March  - 1 x daily - 7 x weekly - 3 sets - 10 reps  GOALS: Goals reviewed with patient? Yes   SHORT TERM GOALS: Target date: 07/11/2023  Patient will be independent in home exercise program to improve strength/mobility for better functional independence with ADLs. Baseline: Goal status: INITIAL   LONG TERM GOALS: Target date: 11/13/2023   1.  Patient will complete five times sit to stand test in < 35 seconds indicating an increased LE strength and improved balance. Baseline: previously 37 sec with UUE 04/19/22 today 05/30/23: 1 min 43 seconds; 6/3: 41 sec with improved technique (use of BUE on armrests);  08/21/2023= 38 sec with some distractibility and VC for hand placement 7:23: 39.06 (41.23, 36.89 sec) Goal status: PARTIALLY MET  2. Patient will demo ability to ambulate > 400 ft w/ LRAD versus hand held assist min assist to demonstrate improved household mobility and short community distances.  Baseline: 4/28: 165ft with RW. Mod assist for AD management to prevent veer to the R. 6/3: ~ 300 ft with RW, requires cuing throughout for proximity to RW particularly with turns, but this has improved compared to eval;  08/21/2023= Patient ambulated approx 300 feet with RW- Sill some min assist at times to navigate walker and VC to remain close to walker.  7/23: 320ft with RW and min assist for safety and moderate instruction for safety AD intermittently, especially with fatigue  Goal status: IN PROGRESS  3.  Patient will increase 10 meter walk test to >1.70m/s as to improve gait speed for better community ambulation and to reduce fall risk. Baseline: previously 0.36 m/s on 04/19/22 : 0.25 m/s;  07/10/23: 0. 24 m/s with RW;  08/15/2023= 0.39 m/s  with RW yet max VC for RW navigation.  7/23:Average Normal speed with  RW: 0.25 m/s Average Normal speed with HHA: 0.72m/s Goal status: PROGRESSING  4.  Patient will reduce timed up and go (TUG) to <50 seconds to reduce fall risk and demonstrate improved transfer/gait ability Baseline: previously 91 sec with RW on 04/19/22; 05/30/23: 1 min 30 sec with RW 07/19/2023: 48.265 seconds using RW with skilled min A primarily for AD management with pt having difficulty turning with AD;  08/21/2023- 33 sec with HHA and 1 min 25 sec with RW (Max VC for safety with turning back to seat and to  reach back safely)  7/23:42.18sec with RW and min assist for safety in turn; 40.35 sec with HHA and min assist for safety with turns.  Goal status: IN PROGRESS  5. Pt will exhibit correct and safe technique with stand>sit into a standard chair or transport chair to reduce risk of falling.   Baseline: pt current technique unsafe, poor motor planning; 6/3: pt with improved technique, bring RW closer to chair prior to sitting; 08/21/2023- Patient able to perform with CGA yet inconsistent with cues for safety and consistency with hand placement.   Goal status: PARTIALLY MET   6. The pt will demonstrate at least a 15 point improvement on SIS-16 to indicate increased ease with ADLs. Baseline: 45 (filled out by pt's mother); 42; 08/21/2023= 46; 09/03/2023: 46/80 Goal status: ONGOING   ASSESSMENT:  CLINICAL IMPRESSION:    Pt still with limited carryover whne it comes to managing AD and performing transfers to seated position.  Pt still leaving AD far out front to where pt is bent forward and outside of the cone of support causing her to let go of the AD.  Pt performed slightly better with HHA, however is unable to sequence the movement of the R hand on the bar when walking forward/retro without the use of verbal cues.  Pt participated in soccer kicks with 3# AW donned and pt was very fatigued at the conclusion of the session.   Pt will continue to benefit from skilled therapy to address remaining  deficits in order to improve overall QoL and return to PLOF.        OBJECTIVE IMPAIRMENTS: Abnormal gait, decreased activity tolerance, decreased balance, decreased coordination, decreased endurance, decreased knowledge of use of DME, decreased mobility, difficulty walking, decreased strength, decreased safety awareness, increased edema, impaired tone, improper body mechanics, and postural dysfunction.   ACTIVITY LIMITATIONS: carrying, lifting, bending, standing, squatting, stairs, transfers, bathing, dressing, locomotion level, and caring for others  PARTICIPATION LIMITATIONS: meal prep, cleaning, laundry, medication management, personal finances, driving, shopping, community activity, occupation, and yard work  PERSONAL FACTORS: Sex, Time since onset of injury/illness/exacerbation, and 1-2 comorbidities: per chart PMH includes CAD, HTN, ischemic cardiomyopathy, STEMI, ventricular fibrillation, dysphagia, s/p ICD, apraxia  are also affecting patient's functional outcome.   REHAB POTENTIAL: Fair    CLINICAL DECISION MAKING: Evolving/moderate complexity  EVALUATION COMPLEXITY: High  PLAN:  PT FREQUENCY: 1-2x/week  PT DURATION: 12 weeks  PLANNED INTERVENTIONS: 97164- PT Re-evaluation, 97750- Physical Performance Testing, 97110-Therapeutic exercises, 97530- Therapeutic activity, 97112- Neuromuscular re-education, 97535- Self Care, [id number]- Manual therapy, [phone]- Gait training, [id number]- Orthotic Initial, [phone]- Orthotic/Prosthetic subsequent, [phone]- Canalith repositioning, Patient/Family education, Balance training, Stair training, Taping, Joint mobilization, Spinal mobilization, Vestibular training, DME instructions, Wheelchair mobility training, Cryotherapy, and Moist heat  PLAN FOR NEXT SESSION:  backwards gait training using RW with skilled assistance - perform in environment with limited distractions to allow improved motor learning Transfer training - learning sequence of AD management and  stepping back to seat Gait training using RW in home-like environment Dynamic balance Reaching with use of Blaze Pods for tapping hands    Fonda Simpers, PT, DPT Physical Therapist - [facility]  10/15/23, 4:01 PM

## 2023-10-17 ENCOUNTER — Ambulatory Visit

## 2023-10-17 ENCOUNTER — Encounter

## 2023-10-17 DIAGNOSIS — R2681 Unsteadiness on feet: Secondary | ICD-10-CM

## 2023-10-17 DIAGNOSIS — R269 Unspecified abnormalities of gait and mobility: Secondary | ICD-10-CM

## 2023-10-17 DIAGNOSIS — M6281 Muscle weakness (generalized): Secondary | ICD-10-CM

## 2023-10-17 DIAGNOSIS — R278 Other lack of coordination: Secondary | ICD-10-CM

## 2023-10-17 DIAGNOSIS — R262 Difficulty in walking, not elsewhere classified: Secondary | ICD-10-CM

## 2023-10-17 DIAGNOSIS — G931 Anoxic brain damage, not elsewhere classified: Secondary | ICD-10-CM

## 2023-10-17 DIAGNOSIS — R41841 Cognitive communication deficit: Secondary | ICD-10-CM

## 2023-10-17 DIAGNOSIS — R2689 Other abnormalities of gait and mobility: Secondary | ICD-10-CM

## 2023-10-17 NOTE — Therapy (Signed)
 OUTPATIENT PHYSICAL THERAPY TREATMENT    Patient Name: Beverly [name] MRN: [id number] DOB:08/10/1985, 38 y.o., female Today's Date: 10/17/2023  PCP: [facility] REFERRING PROVIDER: Babs Arthea DASEN, MD  END OF SESSION:   PT End of Session - 10/17/23 1153     Visit Number 28    Number of Visits 41    Date for PT Re-Evaluation 11/13/23    Authorization Type UHC Dual Complete; Medicaid secondary    Authorization Time Period 05/30/23-08/22/23    Progress Note Due on Visit 30    PT Start Time 1148    PT Stop Time 1230    PT Time Calculation (min) 42 min    Equipment Utilized During Treatment Gait belt    Activity Tolerance Patient tolerated treatment well    Behavior During Therapy Flat affect;Impulsive;WFL for tasks assessed/performed          Past Medical History:  Diagnosis Date   Brain injury [facility])    CAD (coronary artery disease)    HLD (hyperlipidemia)    Hypertension    last pregnancy   Ischemic cardiomyopathy    STEMI (ST elevation myocardial infarction) (HCC) 09/2019   SCAD with aneurysmal dilation of proximal LAD.   Vaginal Pap smear, abnormal    when she was 38yo   Ventricular fibrillation (HCC) 09/23/2019   Past Surgical History:  Procedure Laterality Date   CARDIAC CATHETERIZATION     IR GASTROSTOMY TUBE MOD SED  10/07/2019   IR GASTROSTOMY TUBE REMOVAL  07/02/2020   IR REPLACE G-TUBE SIMPLE WO FLUORO  09/28/2023   IR REPLC GASTRO/COLONIC TUBE PERCUT W/FLUORO  11/03/2021   IR REPLC GASTRO/COLONIC TUBE PERCUT W/FLUORO  03/09/2023   LEFT HEART CATH AND CORONARY ANGIOGRAPHY N/A 09/23/2019   Procedure: LEFT HEART CATH AND CORONARY ANGIOGRAPHY;  Surgeon: Mady [name], MD;  Location: [facility] INVASIVE CV LAB;  Service: Cardiovascular;  Laterality: N/A;   SUBQ ICD IMPLANT N/A 11/26/2020   Procedure: SUBQ ICD IMPLANT;  Surgeon: [name], MD;  Location: [facility] INVASIVE CV LAB;  Service: Cardiovascular;  Laterality: N/A;   Patient  Active Problem List   Diagnosis Date Noted   Apraxia 10/05/2021   S/P ICD (internal cardiac defibrillator) procedure 11/27/2020   Hx of Cardiac arrest (HCC) 11/26/2020   Abnormality of gait 03/16/2020   Sleep disturbance 03/16/2020   Oropharyngeal dysphagia 02/12/2020   Coronary artery disease involving native coronary artery of native heart without angina pectoris 12/12/2019   Ischemic cardiomyopathy 12/12/2019   Brain injury (HCC)    Prediabetes    Anoxic brain injury (HCC) 11/05/2019   Dysphagia 11/05/2019   Physical deconditioning 11/05/2019   Acute delirium 11/05/2019   Respiratory failure (HCC)    Encounter for central line placement    Ventricular fibrillation (HCC) 09/23/2019   Acute combined systolic and diastolic heart failure (HCC) 09/23/2019   Ventricular tachycardia, polymorphic (HCC) 09/23/2019   Pelvic pain affecting pregnancy 10/15/2015   Supervision of normal pregnancy in third trimester 09/28/2015   Poor weight gain of pregnancy 09/28/2015   Iron  deficiency anemia of pregnancy 09/01/2015   Increased BMI (body mass index) 07/29/2015    ONSET DATE: August 2021  REFERRING DIAG:  G93.1 (ICD-10-CM) - Anoxic brain injury (HCC)  R48.2 (ICD-10-CM) - Apraxia    THERAPY DIAG:   Muscle weakness (generalized)  Other lack of coordination  Anoxic brain injury (HCC)  Difficulty in walking, not elsewhere classified  Other abnormalities of gait and mobility  Unsteadiness on feet  Abnormality of gait and mobility  Cognitive communication deficit  Rationale for Evaluation and Treatment: Rehabilitation  SUBJECTIVE:                                                                                                                                                                                             SUBJECTIVE STATEMENT:   Pt reports she has not been doing anything, but did do her exercises.    Pt accompanied by: self and father, [place]  PERTINENT HISTORY:    Patient is a 38 year old with diagnois of anoxic brain injury. Pt known to PT clinic. Previously seen for outpatient services March 2024. Pt was receiving HH PT until October 2024 per her mother's report. [name] reports pt using HHA for ambulation, was using RW but she says was told not to do to difficulty with safe technique. Pt has fallen 1x in the past six months trying to ambulate on her own. Pt requires assistance with ADLs, including bathing and dressing. [name] reports pt never indicates any pain. Pt with severe aphasia so is unable to answer questions/pt's mom provides hx. She reports pt has impaired short-term memory, difficulty with stair climbing (has stair lift at home). She reports pt's BLE are weaker than they used to be. The pt is probably limited to <10 min with ambulation due to poor endurance. Pt reports her goal is to be able to use an AD to achieve mod-I mobility rather than relying on someone's HHA to ambulate.She was receiving outpatient PT until Feb. 2022 last year but ran out of visits. Patient has past medical history of Anoxic Brain Injury 09/2019, CAD, HLD, HTN, ISCHEMIC CARDIMYOPATHY, STEMI, V-FIB, ICD on 11/29/2020. Patient is currently living with mom. Mother reports she is abel to walk some with a walker with supervision but primarily ambulates with HHA of mother who is her guardian. Mother also reports she requires assist for all transfers and ADL's.   PAIN:  Are you having pain? No  PRECAUTIONS: Fall  WEIGHT BEARING RESTRICTIONS: No  FALLS: Has patient fallen in last 6 months? Yes. Number of falls 1  LIVING ENVIRONMENT: Lives with: lives with their family, mother Lives in: House/apartment Stairs: has stairs but stair lift installed, pt using lift Has following equipment at home: Environmental consultant - 2 wheeled, Wheelchair (manual), shower chair, and Grab bars, pt uses stair lift  PLOF: Needs assistance with ADLs  PATIENT GOALS: balance, walking, and pt states  basketball  OBJECTIVE:  Note: Objective measures were completed at Evaluation unless otherwise noted.  DIAGNOSTIC FINDINGS:  None recent in chart  COGNITION: Overall cognitive status:  impaired   SENSATION: Unable to complete testing due to impaired cognition, pt unable to report if sensation impairment present, pt mother unsure   EDEMA:  Mom reports pt's feet do swell from time to time   LOWER EXTREMITY MMT:  *accuracy of testing likely impacted by pt difficulty following cues  MMT Right Eval Left Eval  Hip flexion 4+ 4+  Hip extension    Hip abduction 4+ 4+  Hip adduction 4+ 4+  Hip internal rotation    Hip external rotation    Knee flexion 4+ 4+  Knee extension 5 5  Ankle dorsiflexion    Ankle plantarflexion    Ankle inversion    Ankle eversion    (Blank rows = not tested)  TRANSFERS: HHA  tried in session, very unsteady, pt has difficulty with motor planning, little success with cues due to distraction, very unsafe Attempted in session with RW, pt unsteady, gait ataxic, continued significant difficulty with motor planning requiring up to min a + 2 and multi-modal cues to safely sit in chair.   FUNCTIONAL TESTS:  5 times sit to stand: 1 min 43 sec Timed up and go (TUG): 1 min 30 sec with RW 10 meter walk test: 0.25 m/s with RW extensive cuing to complete activity  PATIENT SURVEYS:  SIS-16 filled out by pt's mother, used as questions relevant to pt: 45                                                                                                                              TREATMENT DATE: 10/17/2023  Pt arrived to therapy session in transport chair.  Unless otherwise stated, CGA/min A was provided and gait belt donned in order to ensure pt safety throughout session.   Gait:  Gait training 14ft using RW with CGA progressed to intermittent light min assist for balance and AD management  Gait training 150' with initial 75' utilizing [facility], then just  utilizing HHA on the L to perform with increased degrees of freedom    TherEx: To improve strength, endurance, mobility, and function of specific targeted muscle groups or improve joint range of motion or improve muscle flexibility  Seated LAQ with 3# AW donned, 2x10 each LE Seated marches with 3# AW donned, 2x10 each LE    TherAct: To improve functional movements patterns for everyday tasks  L stand pivot transport chair>green chair using R HHA with min A for balance - continues to have greater challenge stepping R foot back compared to L.   Seated soccer kicks with 3# AW donned, 30 sec rounds x4  Pt attempted to perform standing basketball toss/dunks with the basket of balls, but was unable to completely perform and dropped the ball on several occasions.  Significant amount of time spent here allowing the pt to sort through the grasping portion and sequencing needed to grab the ball and then step towards the goal.  Pt never able to fully perform, however  many attempts made.  Pt also with increased difficulty with retro transfer to the chair for rest break.    Patient continues to remain easily distractible in clinic environment, requiring cuing to refocus attention on current task, and relocating treatment area to less distracting area.  PATIENT EDUCATION: Education details: goal reassessment, indications, exercise technique Person educated: Patient and Parent Education method: Explanation, Demonstration, Tactile cues, and Verbal cues Education comprehension: returned demonstration, verbal cues required, tactile cues required, and needs further education  HOME EXERCISE PROGRAM: Access Code: [email or web address] URL: [email or web address] Date: 06/11/2023 Prepared by: Massie Dollar  Exercises - Standing March with Counter Support  - 1 x daily - 7 x weekly - 2 sets - 8 reps - Side Stepping with Counter Support  - 1 x daily - 7 x weekly - 2 sets - 4 reps - Sit to Stand with  Armchair  - 1 x daily - 7 x weekly - 2 sets - 5 reps - Seated March  - 1 x daily - 7 x weekly - 3 sets - 10 reps  GOALS: Goals reviewed with patient? Yes   SHORT TERM GOALS: Target date: 07/11/2023  Patient will be independent in home exercise program to improve strength/mobility for better functional independence with ADLs. Baseline: Goal status: INITIAL   LONG TERM GOALS: Target date: 11/13/2023   1.  Patient will complete five times sit to stand test in < 35 seconds indicating an increased LE strength and improved balance. Baseline: previously 37 sec with UUE 04/19/22 today 05/30/23: 1 min 43 seconds; 6/3: 41 sec with improved technique (use of BUE on armrests);  08/21/2023= 38 sec with some distractibility and VC for hand placement 7:23: 39.06 (41.23, 36.89 sec) Goal status: PARTIALLY MET  2. Patient will demo ability to ambulate > 400 ft w/ LRAD versus hand held assist min assist to demonstrate improved household mobility and short community distances.  Baseline: 4/28: 162ft with RW. Mod assist for AD management to prevent veer to the R. 6/3: ~ 300 ft with RW, requires cuing throughout for proximity to RW particularly with turns, but this has improved compared to eval;  08/21/2023= Patient ambulated approx 300 feet with RW- Sill some min assist at times to navigate walker and VC to remain close to walker.  7/23: 374ft with RW and min assist for safety and moderate instruction for safety AD intermittently, especially with fatigue  Goal status: IN PROGRESS  3.  Patient will increase 10 meter walk test to >1.46m/s as to improve gait speed for better community ambulation and to reduce fall risk. Baseline: previously 0.36 m/s on 04/19/22 : 0.25 m/s;  07/10/23: 0. 24 m/s with RW;  08/15/2023= 0.39 m/s  with RW yet max VC for RW navigation.  7/23:Average Normal speed with RW: 0.25 m/s Average Normal speed with HHA: 0.71m/s Goal status: PROGRESSING  4.  Patient will reduce timed up and go (TUG)  to <50 seconds to reduce fall risk and demonstrate improved transfer/gait ability Baseline: previously 91 sec with RW on 04/19/22; 05/30/23: 1 min 30 sec with RW 07/19/2023: 48.265 seconds using RW with skilled min A primarily for AD management with pt having difficulty turning with AD;  08/21/2023- 33 sec with HHA and 1 min 25 sec with RW (Max VC for safety with turning back to seat and to reach back safely)  7/23:42.18sec with RW and min assist for safety in turn; 40.35 sec with HHA and min assist for safety with turns.  Goal status: IN  PROGRESS  5. Pt will exhibit correct and safe technique with stand>sit into a standard chair or transport chair to reduce risk of falling.   Baseline: pt current technique unsafe, poor motor planning; 6/3: pt with improved technique, bring RW closer to chair prior to sitting; 08/21/2023- Patient able to perform with CGA yet inconsistent with cues for safety and consistency with hand placement.   Goal status: PARTIALLY MET   6. The pt will demonstrate at least a 15 point improvement on SIS-16 to indicate increased ease with ADLs. Baseline: 45 (filled out by pt's mother); 42; 08/21/2023= 46; 09/03/2023: 46/80 Goal status: ONGOING   ASSESSMENT:  CLINICAL IMPRESSION:   Pt continues to put forth good effort throughout the session and was able to attempt to perform newer activities such as the basketball toss/dunks.  Pt unable to completely perform, however was given time and allowed to sequence the events necessary to pick up the ball and attempt to take a few steps before losing the ball on multiple occasions.  Pt continues to struggle with retro walking to chair as well, whether it be with an AD or with HHA.  Pt continues to need to improve in order to safely transfer, otherwise pt may experience a fall to the floor.   Pt will continue to benefit from skilled therapy to address remaining deficits in order to improve overall QoL and return to PLOF.           OBJECTIVE IMPAIRMENTS: Abnormal gait, decreased activity tolerance, decreased balance, decreased coordination, decreased endurance, decreased knowledge of use of DME, decreased mobility, difficulty walking, decreased strength, decreased safety awareness, increased edema, impaired tone, improper body mechanics, and postural dysfunction.   ACTIVITY LIMITATIONS: carrying, lifting, bending, standing, squatting, stairs, transfers, bathing, dressing, locomotion level, and caring for others  PARTICIPATION LIMITATIONS: meal prep, cleaning, laundry, medication management, personal finances, driving, shopping, community activity, occupation, and yard work  PERSONAL FACTORS: Sex, Time since onset of injury/illness/exacerbation, and 1-2 comorbidities: per chart PMH includes CAD, HTN, ischemic cardiomyopathy, STEMI, ventricular fibrillation, dysphagia, s/p ICD, apraxia  are also affecting patient's functional outcome.   REHAB POTENTIAL: Fair    CLINICAL DECISION MAKING: Evolving/moderate complexity  EVALUATION COMPLEXITY: High  PLAN:  PT FREQUENCY: 1-2x/week  PT DURATION: 12 weeks  PLANNED INTERVENTIONS: 97164- PT Re-evaluation, 97750- Physical Performance Testing, 97110-Therapeutic exercises, 97530- Therapeutic activity, 97112- Neuromuscular re-education, 97535- Self Care, [id number]- Manual therapy, [phone]- Gait training, [phone]- Orthotic Initial, [phone]- Orthotic/Prosthetic subsequent, [phone]- Canalith repositioning, Patient/Family education, Balance training, Stair training, Taping, Joint mobilization, Spinal mobilization, Vestibular training, DME instructions, Wheelchair mobility training, Cryotherapy, and Moist heat  PLAN FOR NEXT SESSION:  backwards gait training using RW with skilled assistance - perform in environment with limited distractions to allow improved motor learning Transfer training - learning sequence of AD management and stepping back to seat Gait training using RW in home-like  environment Dynamic balance Reaching with use of Blaze Pods for tapping hands    Fonda Simpers, PT, DPT Physical Therapist - [facility]  10/17/23, 5:19 PM

## 2023-10-22 ENCOUNTER — Ambulatory Visit

## 2023-10-24 ENCOUNTER — Encounter

## 2023-10-24 ENCOUNTER — Ambulatory Visit: Admitting: Physical Therapy

## 2023-10-24 DIAGNOSIS — G931 Anoxic brain damage, not elsewhere classified: Secondary | ICD-10-CM

## 2023-10-24 DIAGNOSIS — R269 Unspecified abnormalities of gait and mobility: Secondary | ICD-10-CM

## 2023-10-24 DIAGNOSIS — M6281 Muscle weakness (generalized): Secondary | ICD-10-CM | POA: Diagnosis not present

## 2023-10-24 DIAGNOSIS — R278 Other lack of coordination: Secondary | ICD-10-CM

## 2023-10-24 DIAGNOSIS — R262 Difficulty in walking, not elsewhere classified: Secondary | ICD-10-CM

## 2023-10-24 DIAGNOSIS — R2681 Unsteadiness on feet: Secondary | ICD-10-CM

## 2023-10-24 DIAGNOSIS — R2689 Other abnormalities of gait and mobility: Secondary | ICD-10-CM

## 2023-10-24 NOTE — Therapy (Unsigned)
 OUTPATIENT PHYSICAL THERAPY TREATMENT    Patient Name: Beverly [name] MRN: [id number] DOB:February 05, 1986, 38 y.o., female Today's Date: 10/24/2023  PCP: [facility] REFERRING PROVIDER: Babs Arthea DASEN, MD  END OF SESSION:   PT End of Session - 10/24/23 1318     Visit Number 29    Number of Visits 41    Date for PT Re-Evaluation 11/13/23    Authorization Type UHC Dual Complete; Medicaid secondary    Authorization Time Period 05/30/23-08/22/23    Progress Note Due on Visit 30    PT Start Time 1320    PT Stop Time 1400    PT Time Calculation (min) 40 min    Equipment Utilized During Treatment Gait belt    Activity Tolerance Patient tolerated treatment well    Behavior During Therapy Flat affect;Impulsive;WFL for tasks assessed/performed          Past Medical History:  Diagnosis Date   Brain injury [facility])    CAD (coronary artery disease)    HLD (hyperlipidemia)    Hypertension    last pregnancy   Ischemic cardiomyopathy    STEMI (ST elevation myocardial infarction) (HCC) 09/2019   SCAD with aneurysmal dilation of proximal LAD.   Vaginal Pap smear, abnormal    when she was 38yo   Ventricular fibrillation (HCC) 09/23/2019   Past Surgical History:  Procedure Laterality Date   CARDIAC CATHETERIZATION     IR GASTROSTOMY TUBE MOD SED  10/07/2019   IR GASTROSTOMY TUBE REMOVAL  07/02/2020   IR REPLACE G-TUBE SIMPLE WO FLUORO  09/28/2023   IR REPLC GASTRO/COLONIC TUBE PERCUT W/FLUORO  11/03/2021   IR REPLC GASTRO/COLONIC TUBE PERCUT W/FLUORO  03/09/2023   LEFT HEART CATH AND CORONARY ANGIOGRAPHY N/A 09/23/2019   Procedure: LEFT HEART CATH AND CORONARY ANGIOGRAPHY;  Surgeon: Mady [name], MD;  Location: [facility] INVASIVE CV LAB;  Service: Cardiovascular;  Laterality: N/A;   SUBQ ICD IMPLANT N/A 11/26/2020   Procedure: SUBQ ICD IMPLANT;  Surgeon: [name], MD;  Location: [facility] INVASIVE CV LAB;  Service: Cardiovascular;  Laterality: N/A;   Patient  Active Problem List   Diagnosis Date Noted   Apraxia 10/05/2021   S/P ICD (internal cardiac defibrillator) procedure 11/27/2020   Hx of Cardiac arrest (HCC) 11/26/2020   Abnormality of gait 03/16/2020   Sleep disturbance 03/16/2020   Oropharyngeal dysphagia 02/12/2020   Coronary artery disease involving native coronary artery of native heart without angina pectoris 12/12/2019   Ischemic cardiomyopathy 12/12/2019   Brain injury (HCC)    Prediabetes    Anoxic brain injury (HCC) 11/05/2019   Dysphagia 11/05/2019   Physical deconditioning 11/05/2019   Acute delirium 11/05/2019   Respiratory failure (HCC)    Encounter for central line placement    Ventricular fibrillation (HCC) 09/23/2019   Acute combined systolic and diastolic heart failure (HCC) 09/23/2019   Ventricular tachycardia, polymorphic (HCC) 09/23/2019   Pelvic pain affecting pregnancy 10/15/2015   Supervision of normal pregnancy in third trimester 09/28/2015   Poor weight gain of pregnancy 09/28/2015   Iron  deficiency anemia of pregnancy 09/01/2015   Increased BMI (body mass index) 07/29/2015    ONSET DATE: August 2021  REFERRING DIAG:  G93.1 (ICD-10-CM) - Anoxic brain injury (HCC)  R48.2 (ICD-10-CM) - Apraxia    THERAPY DIAG:   Muscle weakness (generalized)  Other lack of coordination  Anoxic brain injury (HCC)  Difficulty in walking, not elsewhere classified  Other abnormalities of gait and mobility  Unsteadiness on feet  Abnormality of gait and mobility  Rationale for Evaluation and Treatment: Rehabilitation  SUBJECTIVE:                                                                                                                                                                                             SUBJECTIVE STATEMENT:   Pt reports she has not been doing anything, but did do her exercises.    Pt accompanied by: self and father, [place]  PERTINENT HISTORY:   Patient is a 38 year old with  diagnois of anoxic brain injury. Pt known to PT clinic. Previously seen for outpatient services March 2024. Pt was receiving HH PT until October 2024 per her mother's report. [name] reports pt using HHA for ambulation, was using RW but she says was told not to do to difficulty with safe technique. Pt has fallen 1x in the past six months trying to ambulate on her own. Pt requires assistance with ADLs, including bathing and dressing. [name] reports pt never indicates any pain. Pt with severe aphasia so is unable to answer questions/pt's mom provides hx. She reports pt has impaired short-term memory, difficulty with stair climbing (has stair lift at home). She reports pt's BLE are weaker than they used to be. The pt is probably limited to <10 min with ambulation due to poor endurance. Pt reports her goal is to be able to use an AD to achieve mod-I mobility rather than relying on someone's HHA to ambulate.She was receiving outpatient PT until Feb. 2022 last year but ran out of visits. Patient has past medical history of Anoxic Brain Injury 09/2019, CAD, HLD, HTN, ISCHEMIC CARDIMYOPATHY, STEMI, V-FIB, ICD on 11/29/2020. Patient is currently living with mom. Mother reports she is abel to walk some with a walker with supervision but primarily ambulates with HHA of mother who is her guardian. Mother also reports she requires assist for all transfers and ADL's.   PAIN:  Are you having pain? No  PRECAUTIONS: Fall  WEIGHT BEARING RESTRICTIONS: No  FALLS: Has patient fallen in last 6 months? Yes. Number of falls 1  LIVING ENVIRONMENT: Lives with: lives with their family, mother Lives in: House/apartment Stairs: has stairs but stair lift installed, pt using lift Has following equipment at home: Environmental consultant - 2 wheeled, Wheelchair (manual), shower chair, and Grab bars, pt uses stair lift  PLOF: Needs assistance with ADLs  PATIENT GOALS: balance, walking, and pt states basketball  OBJECTIVE:  Note:  Objective measures were completed at Evaluation unless otherwise noted.  DIAGNOSTIC FINDINGS:  None recent in chart  COGNITION: Overall cognitive status: impaired   SENSATION:  Unable to complete testing due to impaired cognition, pt unable to report if sensation impairment present, pt mother unsure   EDEMA:  Mom reports pt's feet do swell from time to time   LOWER EXTREMITY MMT:  *accuracy of testing likely impacted by pt difficulty following cues  MMT Right Eval Left Eval  Hip flexion 4+ 4+  Hip extension    Hip abduction 4+ 4+  Hip adduction 4+ 4+  Hip internal rotation    Hip external rotation    Knee flexion 4+ 4+  Knee extension 5 5  Ankle dorsiflexion    Ankle plantarflexion    Ankle inversion    Ankle eversion    (Blank rows = not tested)  TRANSFERS: HHA  tried in session, very unsteady, pt has difficulty with motor planning, little success with cues due to distraction, very unsafe Attempted in session with RW, pt unsteady, gait ataxic, continued significant difficulty with motor planning requiring up to min a + 2 and multi-modal cues to safely sit in chair.   FUNCTIONAL TESTS:  5 times sit to stand: 1 min 43 sec Timed up and go (TUG): 1 min 30 sec with RW 10 meter walk test: 0.25 m/s with RW extensive cuing to complete activity  PATIENT SURVEYS:  SIS-16 filled out by pt's mother, used as questions relevant to pt: 45                                                                                                                              TREATMENT DATE: 10/24/2023  Pt arrived to therapy session in transport chair.  Unless otherwise stated, CGA/min A was provided and gait belt donned in order to ensure pt safety throughout session.  Stand pivot transfer to arm chair with CGA and light UE support on RW.  Sit<>stand throughout treatment with CGA with cues for proper set up and awareness of location of chair with turn to sit.   Gait:  Gait training 2x136ft  using RW with CGA progressed to intermittent light min assist for balance and AD management therapeutic rest break between bouts.   Forward/reverse gait with HHA 28ft x 3 and 65ft x 2. Then performed with RW x 3 bouts x 55ft. Significant freezing of gait with first bout with HHA and then on second bout with RW.   TA:  Grabbing hedge hog and moving to bucket 8 ft away with HHA, x 4 total. Instructed pt to perform with RUE, but after missing hedge hog on first grasp, perform grab with the LUE   Gait training 150' with initial 96' utilizing [facility], then just utilizing HHA on the L to perform with increased degrees of freedom    TherEx: To improve strength, endurance, mobility, and function of specific targeted muscle groups or improve joint range of motion or improve muscle flexibility  Seated LAQ with 3# AW donned, 2x10 each LE Seated marches with 3# AW donned, 2x10 each LE  TherAct: To improve functional movements patterns for everyday tasks  L stand pivot transport chair>green chair using R HHA with min A for balance - continues to have greater challenge stepping R foot back compared to L.   Seated soccer kicks with 3# AW donned, 30 sec rounds x4  Pt attempted to perform standing basketball toss/dunks with the basket of balls, but was unable to completely perform and dropped the ball on several occasions.  Significant amount of time spent here allowing the pt to sort through the grasping portion and sequencing needed to grab the ball and then step towards the goal.  Pt never able to fully perform, however many attempts made.  Pt also with increased difficulty with retro transfer to the chair for rest break.    Patient continues to remain easily distractible in clinic environment, requiring cuing to refocus attention on current task, and relocating treatment area to less distracting area.  PATIENT EDUCATION: Education details: goal reassessment, indications, exercise technique Person  educated: Patient and Parent Education method: Explanation, Demonstration, Tactile cues, and Verbal cues Education comprehension: returned demonstration, verbal cues required, tactile cues required, and needs further education  HOME EXERCISE PROGRAM: Access Code: [email or web address] URL: [email or web address] Date: 06/11/2023 Prepared by: Massie Dollar  Exercises - Standing March with Counter Support  - 1 x daily - 7 x weekly - 2 sets - 8 reps - Side Stepping with Counter Support  - 1 x daily - 7 x weekly - 2 sets - 4 reps - Sit to Stand with Armchair  - 1 x daily - 7 x weekly - 2 sets - 5 reps - Seated March  - 1 x daily - 7 x weekly - 3 sets - 10 reps  GOALS: Goals reviewed with patient? Yes   SHORT TERM GOALS: Target date: 07/11/2023  Patient will be independent in home exercise program to improve strength/mobility for better functional independence with ADLs. Baseline: Goal status: INITIAL   LONG TERM GOALS: Target date: 11/13/2023   1.  Patient will complete five times sit to stand test in < 35 seconds indicating an increased LE strength and improved balance. Baseline: previously 37 sec with UUE 04/19/22 today 05/30/23: 1 min 43 seconds; 6/3: 41 sec with improved technique (use of BUE on armrests);  08/21/2023= 38 sec with some distractibility and VC for hand placement 7:23: 39.06 (41.23, 36.89 sec) Goal status: PARTIALLY MET  2. Patient will demo ability to ambulate > 400 ft w/ LRAD versus hand held assist min assist to demonstrate improved household mobility and short community distances.  Baseline: 4/28: 144ft with RW. Mod assist for AD management to prevent veer to the R. 6/3: ~ 300 ft with RW, requires cuing throughout for proximity to RW particularly with turns, but this has improved compared to eval;  08/21/2023= Patient ambulated approx 300 feet with RW- Sill some min assist at times to navigate walker and VC to remain close to walker.  7/23: 333ft with RW and min  assist for safety and moderate instruction for safety AD intermittently, especially with fatigue  Goal status: IN PROGRESS  3.  Patient will increase 10 meter walk test to >1.41m/s as to improve gait speed for better community ambulation and to reduce fall risk. Baseline: previously 0.36 m/s on 04/19/22 : 0.25 m/s;  07/10/23: 0. 24 m/s with RW;  08/15/2023= 0.39 m/s  with RW yet max VC for RW navigation.  7/23:Average Normal speed with RW: 0.25 m/s Average Normal speed with HHA: 0.8m/s Goal  status: PROGRESSING  4.  Patient will reduce timed up and go (TUG) to <50 seconds to reduce fall risk and demonstrate improved transfer/gait ability Baseline: previously 91 sec with RW on 04/19/22; 05/30/23: 1 min 30 sec with RW 07/19/2023: 48.265 seconds using RW with skilled min A primarily for AD management with pt having difficulty turning with AD;  08/21/2023- 33 sec with HHA and 1 min 25 sec with RW (Max VC for safety with turning back to seat and to reach back safely)  7/23:42.18sec with RW and min assist for safety in turn; 40.35 sec with HHA and min assist for safety with turns.  Goal status: IN PROGRESS  5. Pt will exhibit correct and safe technique with stand>sit into a standard chair or transport chair to reduce risk of falling.   Baseline: pt current technique unsafe, poor motor planning; 6/3: pt with improved technique, bring RW closer to chair prior to sitting; 08/21/2023- Patient able to perform with CGA yet inconsistent with cues for safety and consistency with hand placement.   Goal status: PARTIALLY MET   6. The pt will demonstrate at least a 15 point improvement on SIS-16 to indicate increased ease with ADLs. Baseline: 45 (filled out by pt's mother); 42; 08/21/2023= 46; 09/03/2023: 46/80 Goal status: ONGOING   ASSESSMENT:  CLINICAL IMPRESSION:   Pt continues to put forth good effort throughout the session and was able to attempt to perform newer activities such as the basketball toss/dunks.   Pt unable to completely perform, however was given time and allowed to sequence the events necessary to pick up the ball and attempt to take a few steps before losing the ball on multiple occasions.  Pt continues to struggle with retro walking to chair as well, whether it be with an AD or with HHA.  Pt continues to need to improve in order to safely transfer, otherwise pt may experience a fall to the floor.   Pt will continue to benefit from skilled therapy to address remaining deficits in order to improve overall QoL and return to PLOF.          OBJECTIVE IMPAIRMENTS: Abnormal gait, decreased activity tolerance, decreased balance, decreased coordination, decreased endurance, decreased knowledge of use of DME, decreased mobility, difficulty walking, decreased strength, decreased safety awareness, increased edema, impaired tone, improper body mechanics, and postural dysfunction.   ACTIVITY LIMITATIONS: carrying, lifting, bending, standing, squatting, stairs, transfers, bathing, dressing, locomotion level, and caring for others  PARTICIPATION LIMITATIONS: meal prep, cleaning, laundry, medication management, personal finances, driving, shopping, community activity, occupation, and yard work  PERSONAL FACTORS: Sex, Time since onset of injury/illness/exacerbation, and 1-2 comorbidities: per chart PMH includes CAD, HTN, ischemic cardiomyopathy, STEMI, ventricular fibrillation, dysphagia, s/p ICD, apraxia  are also affecting patient's functional outcome.   REHAB POTENTIAL: Fair    CLINICAL DECISION MAKING: Evolving/moderate complexity  EVALUATION COMPLEXITY: High  PLAN:  PT FREQUENCY: 1-2x/week  PT DURATION: 12 weeks  PLANNED INTERVENTIONS: [id number]- PT Re-evaluation, 97750- Physical Performance Testing, 97110-Therapeutic exercises, 97530- Therapeutic activity, [id number]- Neuromuscular re-education, 97535- Self Care, [id number]- Manual therapy, [phone]- Gait training, [phone]- Orthotic Initial, [phone]-  Orthotic/Prosthetic subsequent, [phone]- Canalith repositioning, Patient/Family education, Balance training, Stair training, Taping, Joint mobilization, Spinal mobilization, Vestibular training, DME instructions, Wheelchair mobility training, Cryotherapy, and Moist heat  PLAN FOR NEXT SESSION:  backwards gait training using RW with skilled assistance - perform in environment with limited distractions to allow improved motor learning Transfer training - learning sequence of AD management and stepping back to seat  Gait training using RW in home-like environment Dynamic balance Reaching with use of Blaze Pods for tapping hands    Fonda Simpers, PT, DPT Physical Therapist - [facility]  10/24/23, 1:19 PM

## 2023-10-29 ENCOUNTER — Ambulatory Visit

## 2023-10-29 ENCOUNTER — Other Ambulatory Visit: Payer: Self-pay | Admitting: Physical Medicine & Rehabilitation

## 2023-10-29 DIAGNOSIS — G931 Anoxic brain damage, not elsewhere classified: Secondary | ICD-10-CM

## 2023-10-29 DIAGNOSIS — R278 Other lack of coordination: Secondary | ICD-10-CM

## 2023-10-29 DIAGNOSIS — R269 Unspecified abnormalities of gait and mobility: Secondary | ICD-10-CM

## 2023-10-29 DIAGNOSIS — R2681 Unsteadiness on feet: Secondary | ICD-10-CM

## 2023-10-29 DIAGNOSIS — M6281 Muscle weakness (generalized): Secondary | ICD-10-CM

## 2023-10-29 DIAGNOSIS — G479 Sleep disorder, unspecified: Secondary | ICD-10-CM

## 2023-10-29 DIAGNOSIS — R262 Difficulty in walking, not elsewhere classified: Secondary | ICD-10-CM

## 2023-10-29 DIAGNOSIS — R2689 Other abnormalities of gait and mobility: Secondary | ICD-10-CM

## 2023-10-29 DIAGNOSIS — R41841 Cognitive communication deficit: Secondary | ICD-10-CM

## 2023-10-29 NOTE — Therapy (Signed)
 OUTPATIENT PHYSICAL THERAPY TREATMENT    Patient Name: Beverly [name] MRN: [id number] DOB:05/18/1985, 38 y.o., female Today's Date: 10/29/2023  PCP: [facility] REFERRING PROVIDER: Babs Arthea DASEN, MD  END OF SESSION:   PT End of Session - 10/29/23 1402     Visit Number 30    Number of Visits 41    Date for Recertification  11/13/23    Authorization Type UHC Dual Complete; Medicaid secondary    Authorization Time Period 05/30/23-08/22/23    Progress Note Due on Visit 30    PT Start Time 1402    PT Stop Time 1445    PT Time Calculation (min) 43 min    Equipment Utilized During Treatment Gait belt    Activity Tolerance Patient tolerated treatment well    Behavior During Therapy Flat affect;Impulsive;WFL for tasks assessed/performed           Past Medical History:  Diagnosis Date   Brain injury [facility])    CAD (coronary artery disease)    HLD (hyperlipidemia)    Hypertension    last pregnancy   Ischemic cardiomyopathy    STEMI (ST elevation myocardial infarction) (HCC) 09/2019   SCAD with aneurysmal dilation of proximal LAD.   Vaginal Pap smear, abnormal    when she was 38yo   Ventricular fibrillation (HCC) 09/23/2019   Past Surgical History:  Procedure Laterality Date   CARDIAC CATHETERIZATION     IR GASTROSTOMY TUBE MOD SED  10/07/2019   IR GASTROSTOMY TUBE REMOVAL  07/02/2020   IR REPLACE G-TUBE SIMPLE WO FLUORO  09/28/2023   IR REPLC GASTRO/COLONIC TUBE PERCUT W/FLUORO  11/03/2021   IR REPLC GASTRO/COLONIC TUBE PERCUT W/FLUORO  03/09/2023   LEFT HEART CATH AND CORONARY ANGIOGRAPHY N/A 09/23/2019   Procedure: LEFT HEART CATH AND CORONARY ANGIOGRAPHY;  Surgeon: Mady [name], MD;  Location: [facility] INVASIVE CV LAB;  Service: Cardiovascular;  Laterality: N/A;   SUBQ ICD IMPLANT N/A 11/26/2020   Procedure: SUBQ ICD IMPLANT;  Surgeon: [name], MD;  Location: [facility] INVASIVE CV LAB;  Service: Cardiovascular;  Laterality: N/A;   Patient  Active Problem List   Diagnosis Date Noted   Apraxia 10/05/2021   S/P ICD (internal cardiac defibrillator) procedure 11/27/2020   Hx of Cardiac arrest (HCC) 11/26/2020   Abnormality of gait 03/16/2020   Sleep disturbance 03/16/2020   Oropharyngeal dysphagia 02/12/2020   Coronary artery disease involving native coronary artery of native heart without angina pectoris 12/12/2019   Ischemic cardiomyopathy 12/12/2019   Brain injury (HCC)    Prediabetes    Anoxic brain injury (HCC) 11/05/2019   Dysphagia 11/05/2019   Physical deconditioning 11/05/2019   Acute delirium 11/05/2019   Respiratory failure (HCC)    Encounter for central line placement    Ventricular fibrillation (HCC) 09/23/2019   Acute combined systolic and diastolic heart failure (HCC) 09/23/2019   Ventricular tachycardia, polymorphic (HCC) 09/23/2019   Pelvic pain affecting pregnancy 10/15/2015   Supervision of normal pregnancy in third trimester 09/28/2015   Poor weight gain of pregnancy 09/28/2015   Iron  deficiency anemia of pregnancy 09/01/2015   Increased BMI (body mass index) 07/29/2015    ONSET DATE: August 2021  REFERRING DIAG:  G93.1 (ICD-10-CM) - Anoxic brain injury (HCC)  R48.2 (ICD-10-CM) - Apraxia    THERAPY DIAG:   Muscle weakness (generalized)  Other lack of coordination  Anoxic brain injury (HCC)  Difficulty in walking, not elsewhere classified  Other abnormalities of gait and mobility  Unsteadiness on feet  Abnormality of gait and mobility  Cognitive communication deficit  Rationale for Evaluation and Treatment: Rehabilitation  SUBJECTIVE:                                                                                                                                                                                             SUBJECTIVE STATEMENT:   [name] reports that over the past few days, the pt is just low on energy level.  Pt has little to no motivation to get out of the bed.     Pt accompanied by: self and mom  PERTINENT HISTORY:   Patient is a 38 year old with diagnois of anoxic brain injury. Pt known to PT clinic. Previously seen for outpatient services March 2024. Pt was receiving HH PT until October 2024 per her mother's report. [name] reports pt using HHA for ambulation, was using RW but she says was told not to do to difficulty with safe technique. Pt has fallen 1x in the past six months trying to ambulate on her own. Pt requires assistance with ADLs, including bathing and dressing. [name] reports pt never indicates any pain. Pt with severe aphasia so is unable to answer questions/pt's mom provides hx. She reports pt has impaired short-term memory, difficulty with stair climbing (has stair lift at home). She reports pt's BLE are weaker than they used to be. The pt is probably limited to <10 min with ambulation due to poor endurance. Pt reports her goal is to be able to use an AD to achieve mod-I mobility rather than relying on someone's HHA to ambulate.She was receiving outpatient PT until Feb. 2022 last year but ran out of visits. Patient has past medical history of Anoxic Brain Injury 09/2019, CAD, HLD, HTN, ISCHEMIC CARDIMYOPATHY, STEMI, V-FIB, ICD on 11/29/2020. Patient is currently living with mom. Mother reports she is abel to walk some with a walker with supervision but primarily ambulates with HHA of mother who is her guardian. Mother also reports she requires assist for all transfers and ADL's.   PAIN:  Are you having pain? No  PRECAUTIONS: Fall  WEIGHT BEARING RESTRICTIONS: No  FALLS: Has patient fallen in last 6 months? Yes. Number of falls 1  LIVING ENVIRONMENT: Lives with: lives with their family, mother Lives in: House/apartment Stairs: has stairs but stair lift installed, pt using lift Has following equipment at home: Environmental consultant - 2 wheeled, Wheelchair (manual), shower chair, and Grab bars, pt uses stair lift  PLOF: Needs assistance with  ADLs  PATIENT GOALS: balance, walking, and pt states basketball  OBJECTIVE:  Note: Objective measures were completed at Evaluation  unless otherwise noted.  DIAGNOSTIC FINDINGS:  None recent in chart  COGNITION: Overall cognitive status: impaired   SENSATION: Unable to complete testing due to impaired cognition, pt unable to report if sensation impairment present, pt mother unsure   EDEMA:  Mom reports pt's feet do swell from time to time   LOWER EXTREMITY MMT:  *accuracy of testing likely impacted by pt difficulty following cues  MMT Right Eval Left Eval  Hip flexion 4+ 4+  Hip extension    Hip abduction 4+ 4+  Hip adduction 4+ 4+  Hip internal rotation    Hip external rotation    Knee flexion 4+ 4+  Knee extension 5 5  Ankle dorsiflexion    Ankle plantarflexion    Ankle inversion    Ankle eversion    (Blank rows = not tested)  TRANSFERS: HHA  tried in session, very unsteady, pt has difficulty with motor planning, little success with cues due to distraction, very unsafe Attempted in session with RW, pt unsteady, gait ataxic, continued significant difficulty with motor planning requiring up to min a + 2 and multi-modal cues to safely sit in chair.   FUNCTIONAL TESTS:  5 times sit to stand: 1 min 43 sec Timed up and go (TUG): 1 min 30 sec with RW 10 meter walk test: 0.25 m/s with RW extensive cuing to complete activity  PATIENT SURVEYS:  SIS-16 filled out by pt's mother, used as questions relevant to pt: 45                                                                                                                              TREATMENT DATE: 10/29/2023   Pt arrived to therapy session in transport chair.  Unless otherwise stated, CGA/min A was provided and gait belt donned in order to ensure pt safety throughout session.  Physical Performance Testing:  10 Meter Walk Test: Patient instructed to walk 10 meters (32.8 ft) as quickly and as safely as possible  at their normal speed Results: 0.23 m/s (44.35 seconds)  Cut off scores:   Household Ambulator  < 0.4 m/s  Limited Community Ambulator  0.4 - 0.8 m/s  [employer]  > 0.8 m/s  Increased fall risk  < 1.70m/s  Crossing a Street  >1.82m/s  MCID 0.05 m/s (small), 0.13 m/s (moderate), 0.06 m/s (significant)  (ANPTA Core Set of Outcome Measures for Adults with Neurologic Conditions, 2018)     Five times Sit to Stand Test (FTSS)  TIME: 56.85 with HHA; 1 min 16 sec with B UE on armrests  Cut off scores indicative of increased fall risk: >12 sec CVA, >16 sec PD, >13 sec vestibular (ANPTA Core Set of Outcome Measures for Adults with Neurologic Conditions, 2018)  Ambulation around the gym for 500' to assess endurance levels and need of assistance, however required modA and needs to reduce the assistance necessary for ambulation.  Patient continues to remain easily distractible in  clinic environment, requiring cuing to refocus attention on current task, and relocating treatment area to less distracting area.  PATIENT EDUCATION: Education details: goal reassessment, indications, exercise technique Person educated: Patient and Parent Education method: Explanation, Demonstration, Tactile cues, and Verbal cues Education comprehension: returned demonstration, verbal cues required, tactile cues required, and needs further education  HOME EXERCISE PROGRAM: Access Code: [email or web address] URL: [email or web address] Date: 06/11/2023 Prepared by: Massie Dollar  Exercises - Standing March with Counter Support  - 1 x daily - 7 x weekly - 2 sets - 8 reps - Side Stepping with Counter Support  - 1 x daily - 7 x weekly - 2 sets - 4 reps - Sit to Stand with Armchair  - 1 x daily - 7 x weekly - 2 sets - 5 reps - Seated March  - 1 x daily - 7 x weekly - 3 sets - 10 reps  GOALS: Goals reviewed with patient? Yes   SHORT TERM GOALS: Target date: 07/11/2023  Patient will be independent in  home exercise program to improve strength/mobility for better functional independence with ADLs. Baseline: Goal status: INITIAL   LONG TERM GOALS: Target date: 11/13/2023  1.  Patient will complete five times sit to stand test in < 35 seconds indicating an increased LE strength and improved balance. Baseline: previously 37 sec with UUE 04/19/22 today 05/30/23: 1 min 43 seconds; 6/3: 41 sec with improved technique (use of BUE on armrests);  08/21/2023= 38 sec with some distractibility and VC for hand placement 7:23: 39.06 (41.23, 36.89 sec) 10/29/23: 56.85 with HHA; 1 min 16 sec with B UE on armrests Goal status: PARTIALLY MET  2. Patient will demo ability to ambulate > 400 ft w/ LRAD versus hand held assist min assist to demonstrate improved household mobility and short community distances.  Baseline: 4/28: 137ft with RW. Mod assist for AD management to prevent veer to the R. 6/3: ~ 300 ft with RW, requires cuing throughout for proximity to RW particularly with turns, but this has improved compared to eval;  08/21/2023= Patient ambulated approx 300 feet with RW- Sill some min assist at times to navigate walker and VC to remain close to walker.  7/23: 350ft with RW and min assist for safety and moderate instruction for safety AD intermittently, especially with fatigue  10/29/23:  Pt able to ambulate 500' with RW, however required modA at times to keep the walker stable Goal status: IN PROGRESS  3.  Patient will increase 10 meter walk test to >1.18m/s as to improve gait speed for better community ambulation and to reduce fall risk. Baseline: previously 0.36 m/s on 04/19/22 : 0.25 m/s;  07/10/23: 0. 24 m/s with RW;  08/15/2023= 0.39 m/s  with RW yet max VC for RW navigation.  7/23:Average Normal speed with RW: 0.25 m/s Average Normal speed with HHA: 0.83m/s 10/29/23: Average Normal speed with RW: 0.23 m/s Goal status: PROGRESSING  4.  Patient will reduce timed up and go (TUG) to <50 seconds to reduce  fall risk and demonstrate improved transfer/gait ability Baseline: previously 91 sec with RW on 04/19/22; 05/30/23: 1 min 30 sec with RW 07/19/2023: 48.265 seconds using RW with skilled min A primarily for AD management with pt having difficulty turning with AD;  08/21/2023- 33 sec with HHA and 1 min 25 sec with RW (Max VC for safety with turning back to seat and to reach back safely)  7/23:42.18sec with RW and min assist for safety in turn; 40.35 sec with HHA  and min assist for safety with turns.  10/29/23: TBD Goal status: IN PROGRESS  5. Pt will exhibit correct and safe technique with stand>sit into a standard chair or transport chair to reduce risk of falling.   Baseline: pt current technique unsafe, poor motor planning; 6/3: pt with improved technique, bring RW closer to chair prior to sitting; 08/21/2023- Patient able to perform with CGA yet inconsistent with cues for safety and consistency with hand placement.  10/29/23: Pt still with inconsistent STS's, utilizing heavy UE assistance  Goal status: PARTIALLY MET   6. The pt will demonstrate at least a 15 point improvement on SIS-16 to indicate increased ease with ADLs. Baseline: 45 (filled out by pt's mother); 42; 08/21/2023= 46; 09/03/2023: 46/80 10/29/23: TBD Goal status: ONGOING   ASSESSMENT:  CLINICAL IMPRESSION:   Goals assessed and noted above.  Currently pt having increased difficulty achieving goals at this time and has regressed some, however pt's mom attributes this to the lack of energy the pt has had over the past several days.  Pt is still making some progress, but not evident by the goal assessment for today.  Pt took a significant amount of time to perform the 500' of ambulation around the gym and backing up into the chair.  Patient's condition has the potential to improve in response to therapy. Maximum improvement is yet to be obtained. The anticipated improvement is attainable and reasonable in a generally predictable time.   Pt  will continue to benefit from skilled therapy to address remaining deficits in order to improve overall QoL and return to PLOF.     OBJECTIVE IMPAIRMENTS: Abnormal gait, decreased activity tolerance, decreased balance, decreased coordination, decreased endurance, decreased knowledge of use of DME, decreased mobility, difficulty walking, decreased strength, decreased safety awareness, increased edema, impaired tone, improper body mechanics, and postural dysfunction.   ACTIVITY LIMITATIONS: carrying, lifting, bending, standing, squatting, stairs, transfers, bathing, dressing, locomotion level, and caring for others  PARTICIPATION LIMITATIONS: meal prep, cleaning, laundry, medication management, personal finances, driving, shopping, community activity, occupation, and yard work  PERSONAL FACTORS: Sex, Time since onset of injury/illness/exacerbation, and 1-2 comorbidities: per chart PMH includes CAD, HTN, ischemic cardiomyopathy, STEMI, ventricular fibrillation, dysphagia, s/p ICD, apraxia  are also affecting patient's functional outcome.   REHAB POTENTIAL: Fair    CLINICAL DECISION MAKING: Evolving/moderate complexity  EVALUATION COMPLEXITY: High  PLAN:  PT FREQUENCY: 1-2x/week  PT DURATION: 12 weeks  PLANNED INTERVENTIONS: [id number]- PT Re-evaluation, 97750- Physical Performance Testing, 97110-Therapeutic exercises, 97530- Therapeutic activity, 97112- Neuromuscular re-education, 97535- Self Care, [id number]- Manual therapy, [phone]- Gait training, [phone]- Orthotic Initial, [phone]- Orthotic/Prosthetic subsequent, [phone]- Canalith repositioning, Patient/Family education, Balance training, Stair training, Taping, Joint mobilization, Spinal mobilization, Vestibular training, DME instructions, Wheelchair mobility training, Cryotherapy, and Moist heat  PLAN FOR NEXT SESSION:    backwards gait training using RW with skilled assistance - perform in environment with limited distractions to allow improved motor  learning Transfer training - learning sequence of AD management and stepping back to seat Gait training using RW in home-like environment Dynamic balance Reaching with use of Blaze Pods for tapping hands   Fonda Simpers, PT, DPT Physical Therapist - [facility]  10/29/23, 4:54 PM

## 2023-10-29 NOTE — Therapy (Signed)
 Occupational Therapy Outpatient Neuro Treatment Note  Dates of reporting period  07/19/2023   to   10/15/23   Patient Name: Beverly [name] MRN: [id number] DOB:[birth year], 38 y.o., female Today's Date: 10/29/2023   REFERRING PROVIDER: Babs Hussar, MD  END OF SESSION:  OT End of Session - 10/29/23 1312     Visit Number 11    Number of Visits 22    Date for Recertification  01/07/24    OT Start Time 1315    OT Stop Time 1400    OT Time Calculation (min) 45 min    Activity Tolerance Patient tolerated treatment well    Behavior During Therapy [facility] for tasks assessed/performed         Past Medical History:  Diagnosis Date   Brain injury (HCC)    CAD (coronary artery disease)    HLD (hyperlipidemia)    Hypertension    last pregnancy   Ischemic cardiomyopathy    STEMI (ST elevation myocardial infarction) (HCC) 09/2019   SCAD with aneurysmal dilation of proximal LAD.   Vaginal Pap smear, abnormal    when she was 38yo   Ventricular fibrillation (HCC) 09/23/2019   Past Surgical History:  Procedure Laterality Date   CARDIAC CATHETERIZATION     IR GASTROSTOMY TUBE MOD SED  10/07/2019   IR GASTROSTOMY TUBE REMOVAL  07/02/2020   IR REPLACE G-TUBE SIMPLE WO FLUORO  09/28/2023   IR REPLC GASTRO/COLONIC TUBE PERCUT W/FLUORO  11/03/2021   IR REPLC GASTRO/COLONIC TUBE PERCUT W/FLUORO  03/09/2023   LEFT HEART CATH AND CORONARY ANGIOGRAPHY N/A 09/23/2019   Procedure: LEFT HEART CATH AND CORONARY ANGIOGRAPHY;  Surgeon: Mady [name], MD;  Location: [facility] INVASIVE CV LAB;  Service: Cardiovascular;  Laterality: N/A;   SUBQ ICD IMPLANT N/A 11/26/2020   Procedure: SUBQ ICD IMPLANT;  Surgeon: [name], MD;  Location: [facility] INVASIVE CV LAB;  Service: Cardiovascular;  Laterality: N/A;   Patient Active Problem List   Diagnosis Date Noted   Apraxia 10/05/2021   S/P ICD (internal cardiac defibrillator) procedure 11/27/2020   Hx of Cardiac arrest (HCC) 11/26/2020   Abnormality of gait  03/16/2020   Sleep disturbance 03/16/2020   Oropharyngeal dysphagia 02/12/2020   Coronary artery disease involving native coronary artery of native heart without angina pectoris 12/12/2019   Ischemic cardiomyopathy 12/12/2019   Brain injury (HCC)    Prediabetes    Anoxic brain injury (HCC) 11/05/2019   Dysphagia 11/05/2019   Physical deconditioning 11/05/2019   Acute delirium 11/05/2019   Respiratory failure (HCC)    Encounter for central line placement    Ventricular fibrillation (HCC) 09/23/2019   Acute combined systolic and diastolic heart failure (HCC) 09/23/2019   Ventricular tachycardia, polymorphic (HCC) 09/23/2019   Pelvic pain affecting pregnancy 10/15/2015   Supervision of normal pregnancy in third trimester 09/28/2015   Poor weight gain of pregnancy 09/28/2015   Iron  deficiency anemia of pregnancy 09/01/2015   Increased BMI (body mass index) 07/29/2015   ONSET DATE: 09/23/19  REFERRING DIAG: Anoxic Brain Injury  THERAPY DIAG:  Muscle weakness (generalized)  Other lack of coordination  Anoxic brain injury (HCC)  Rationale for Evaluation and Treatment: Rehabilitation  SUBJECTIVE:  SUBJECTIVE STATEMENT: Mother confirmed brushing teeth and donning shirt continue to be of high importance for OT goals.   Pt accompanied by:  Mother  PERTINENT HISTORY:  Pt. Is a 38 y.o. female with a history of severe aphasia  from a Cardiac Arrest leading to Anoxic Brain Injury on 09/23/19  at 2 weeks postpartum. Pt. Currently hs a G-Tube in place. PMHx includes: CAD, HLD, HTN, Ischemic cardiomyopathy, STEMI, V-Fib, and ICD on 11/29/20.  PRECAUTIONS: None  WEIGHT BEARING RESTRICTIONS: No  PAIN:  Are you having pain? No  FALLS: Has patient fallen in last 6 months? No  LIVING ENVIRONMENT: Lives with: mother Lives in: House/apartment Stairs:  two story; No steps to enter. 10 steps inside, and has a chair lift Has following equipment at home: Walker - 2 wheeled and Wheelchair  (manual), stair chair lift  PLOF: Independent  PATIENT GOALS: To improve bilateral hand function for increased engagement in ADLs, and IADL tasks.  OBJECTIVE:  Note: Objective measures were completed at Evaluation unless otherwise noted.  HAND DOMINANCE: Left  ADLs: Per father report  Transfers/ambulation related to ADLs: Eating:  Dependent/feeding tube Grooming: Independent washing face, assist with hair care  UB Dressing: Assist with donning shirt LB Dressing: Assist with donning LE clothing  Toileting: Assist with transfers, toilet hygiene skills, and clothing negotiation Bathing:  Unknown  IADLs: Per father report Shopping: Total A Light housekeeping: Total A Meal Prep: Total A Medication management:   Total A Financial management: Total A Handwriting: 50% legible printing name only using 1 inch letters.  MOBILITY STATUS: Needs Assist: using walker  POSTURE COMMENTS:   Sitting balance:  fair  ACTIVITY TOLERANCE: Activity tolerance:  Fair   FUNCTIONAL OUTCOME MEASURES: TBD  UPPER EXTREMITY ROM:    Active ROM Right Eval WFL Left Eval [facility]  Shoulder flexion    Shoulder abduction    Shoulder adduction    Shoulder extension    Shoulder internal rotation    Shoulder external rotation    Elbow flexion    Elbow extension    Wrist flexion    Wrist extension    Wrist ulnar deviation    Wrist radial deviation    Wrist pronation    Wrist supination    (Blank rows = not tested)  UPPER EXTREMITY MMT:     MMT Right eval Right 10/15/23 Left eval Left 10/15/23  Shoulder flexion 3/5 4-/5 3+/5 4-/5  Shoulder abduction 3/5 4-/5 3+/5 4-/5  Shoulder adduction      Shoulder extension      Shoulder internal rotation      Shoulder external rotation      Middle trapezius      Lower trapezius      Elbow flexion 3/5 4-/5 3+/5 4-/5  Elbow extension 3/5 4+/5 3+/5 4-/5  Wrist flexion      Wrist extension 3/5 3+/5 3+/5 4-/5  Wrist ulnar deviation      Wrist radial  deviation      Wrist pronation      Wrist supination      (Blank rows = not tested)  HAND FUNCTION: Grip strength: Right: 10 lbs; Left: 18 lbs, Lateral pinch: Right: 5 lbs, Left: 7 lbs.  10/15/23:  Grip strength: Right: 15 lbs; Left: 25 lbs, Lateral pinch: Right: 4 lbs, Left: 11 lbs.   COORDINATION:  Eval:  Nine Hole Peg test: Pt. was able to formulate isolated 2nd digit extension in anticipation for grasping the horizontal pegs.  10/15/2023:   9 Hole Peg Test:   -Pt. was unable to grasp the pegs from a horizontal position, and transition them to a vertical position in preparation for placing them into the pegboard.   -To remove pegs only: Right: 1 min. & 45 sec.;  Left: 1 min. & 16 sec.   SENSATION: Light  touch intact  EDEMA: N/A   COGNITION: Overall cognitive status: impaired   VISION:  Baseline:  wears glasses.   VISION ASSESSMENT: To be further assessed in functional context  PERCEPTION: WFL  PRAXIS: WFL                                                                                                       TREATMENT DATE: 10/29/2023 Self Care: -Brushing teeth at sink, wc level: -Good attempt to open toothpaste, but unable.   -OT secured rolled washcloth with tape to build up handle on toothbrush for easier grasp.  Red foam handle issued for pt's toothbrush at home but mom reports toothbrush is too wide.  OT recommended washcloth with tape or rubber band or obtaining set of foam handles with a variety of diameters to fit wider utensils -Step by step cues for sequencing brushing top/bottom, cues for repeat trials to increase thoroughness -Spit cup: -Pt requires mod A to ensure she doesn't miss cup to discard her spit; wipes mouth with poor-fair thoroughness with dry washcloth -Recommended mother to avoid plastic cup as cup breaks with forceful squeezing; would benefit from metal cup with handle  -Donning/doffing tshirt: -Pt able to identify front/back of  tshirt, but when placed in her lap required set up A and task initiation to begin donning shirt correct way   -Mod A for donning and doffing, step by step cues required  -2 trials for 2 techniques: Arms first vs head first  -Recommendation to contrast tank top with tshirt to assess whether pt struggled to identify where shirt needed to be pulled down d/t black tank beneath black tshirt today.  PATIENT EDUCATION: Education details:  ADL training/activity modification Person educated: Parent Education method: Leisure centre manager cues Education comprehension: verbalized understanding  HOME EXERCISE PROGRAM:  -increasing opportunities for engaging the bilateral hands during ADL tasks.  GOALS: Goals reviewed with patient? Yes  SHORT TERM GOALS: Target date: 11/26/2023  Pt. Will require supervision HEPs for BUE strengthening  Baseline: Eval: No current HEP. Goal status: INITIAL   LONG TERM GOALS: Target date: 01/07/2024   Pt. Will increase BUE strength by 2 mm grades to assist with ADLs, and IADLs. Baseline: 10/15/23: RUE: Right shoulder flexion:  4-/5, abduction: 4-/5, elbow flexion: 4-/5, elbow extension: 4+/5, wrist extension: 3+/5; LUE: 4-/5 overall Eval:  RUE: 3/5 overall, LUE: 3+/5 overall Goal status: Progressing/Ongoing  2.  Pt. will improve bilateral grip strength by 5# to be able to securely hold items in her hands. Baseline: 10/15/23: Grip Right: 15#, Left: 25# Eval: Grip Right: 10#, Left: 18# Goal status: Progressing/Ongoing  3.  Pt. Will improve bilateral lateral pinch strength by 3# to assist with grooming tasks. Baseline: 10/15/23: lateral pinch strength:  R: 4#, L: 11# Eval: R: 4#, L: 7# Goal status: Progressing/Ongoing  4.  Pt. Will improve bilateral hand FMC grasping, and placing one peg in the 9 hole peg test in preparation for improved manipulation of small objects. Baseline: 10/15/23: 9 Hole Peg Test: Removing pegs only: R: 1 min. & 45 sec.; L: 1  min. &16 sec.  Eval: Pt. is able to initiate formulating isolated 2nd digit extension in preparation for grasping small objects. Goal status: Progressing/Ongoing  5.  Pt.  Will write her name her full name with 75% legibility. Baseline: 10/15/23: Continue Eval: 50% legible printing name only using 1 inch letters. Goal status: Ongoing  6. Pt.  Will demonstrate modified techniques for ADLs with minA.  Baseline: 10/15/23: requires mod/maxA Eval: Does not currently use adaptive, or modified techniques during ADLS.  Goal status: ongoing  7.  Pt will brush teeth with set up and mod A for thoroughness.  Baseline: 10/15/23: MaxA 08/29/23: Dep  Goal status:Ongoing  8.  Pt will don tshirt with min A.  Baseline: 10/15/23: ModA per mother. 08/29/23: Max-dep; mother reports pt gets her shirt turned around and often struggles to pull over her head  Goal status: Ongoing  9. Pt will spit saliva into cup using cup with handle and wipe mouth with washcloth/hand towel with supv-min A.  Baseline: 10/15/23: Mod-maxA 08/29/23: Max-dep; pt squeezes plastic cup too hard/paper cup slips from hands.  Pt struggles to wipe mouth thoroughly with napkin or tissue.  Goal status: Ongoing   ASSESSMENT:  CLINICAL IMPRESSION: Focus on seated ADLs this date as noted above.  Pt. continues to require heavy step-by-step verbal and tactile cuing with increased time to initiate each task, but pt was able to participate in each step of brushing teeth, spitting into cup, and donning/doffing tshirt.  Mother reports pt has a new toothbrush at home which red foam handle does not fit over.  Pt did well with washcloth secured to toothbrush handle today to built up grip, and was able to sustain grasp throughout this ADL.  Discussed possibility of electric toothbrush for increasing thoroughness and to improve grasp from a wider handle, but this may cause mess if brush is left on with toothpaste still on brush.  Mother reported she did find it messy when using  one with pt in the past, but was receptive to use of washcloth to build up grip, or to obtain wider foam handles d/t red foam unable to fit.  OT provided options to obtain if desired.  Pt showed improved ability to don shirt when donning arms through sleeves first vs head first; relayed to mother to encourage carryover of this strategy.  Pt did make attempts to pull down shirt in the front and back, but struggled to initiate grasp patterns and to sustain grasp.  OT also wonders if contrasting undershirt and tshirt color may help pt to identify where to grasp, as pt had 2 black shirts on today.  Mother receptive to all recommendations.  Pt. continues to work on improving BUE functioning, and ADL/IADL tasks. Pt. continues to benefit from OT services to maximize pt's participation in ADLs, increase ADL safety, and provide caregiver education as needed to ease caregiver assist with ADLs.   PERFORMANCE DEFICITS: in functional skills including ADLs, IADLs, coordination, dexterity, proprioception, ROM, strength, pain, Fine motor control, and UE functional use, cognitive skills including attention, memory, problem solving, and safety awareness, and psychosocial skills including coping strategies, environmental adaptation, and routines and behaviors.   IMPAIRMENTS: are limiting patient from ADLs, IADLs, and leisure.   CO-MORBIDITIES: may have co-morbidities  that affects occupational performance. Patient will benefit from skilled OT to address above impairments and improve overall function.  MODIFICATION OR ASSISTANCE TO COMPLETE EVALUATION: Min-Moderate modification of tasks or assist with assess necessary to complete an evaluation.  OT OCCUPATIONAL  PROFILE AND HISTORY: Detailed assessment: Review of records and additional review of physical, cognitive, psychosocial history related to current functional performance.  CLINICAL DECISION MAKING: Moderate - several treatment options, min-mod task modification  necessary  REHAB POTENTIAL: Good  EVALUATION COMPLEXITY: Moderate    PLAN:  OT FREQUENCY: 1x/week  OT DURATION: 12 weeks  PLANNED INTERVENTIONS: 97535 self care/ADL training, 02889 therapeutic exercise, 97530 therapeutic activity, 97112 neuromuscular re-education, 97140 manual therapy, 97018 paraffin, 02989 moist heat, 97760 Orthotic Initial, 97761 Prosthetic Initial, 97763 Orthotic/Prosthetic subsequent, functional mobility training, visual/perceptual remediation/compensation, energy conservation, patient/family education, and DME and/or AE instructions  RECOMMENDED OTHER SERVICES: PT/ST  CONSULTED AND AGREED WITH PLAN OF CARE: Patient  PLAN FOR NEXT SESSION: Treatment  Inocente Blazing, MS, OTR/L  10/15/23

## 2023-10-31 ENCOUNTER — Ambulatory Visit

## 2023-10-31 ENCOUNTER — Encounter

## 2023-10-31 ENCOUNTER — Other Ambulatory Visit: Payer: Self-pay | Admitting: Cardiology

## 2023-10-31 DIAGNOSIS — R2689 Other abnormalities of gait and mobility: Secondary | ICD-10-CM

## 2023-10-31 DIAGNOSIS — G931 Anoxic brain damage, not elsewhere classified: Secondary | ICD-10-CM

## 2023-10-31 DIAGNOSIS — R269 Unspecified abnormalities of gait and mobility: Secondary | ICD-10-CM

## 2023-10-31 DIAGNOSIS — R278 Other lack of coordination: Secondary | ICD-10-CM

## 2023-10-31 DIAGNOSIS — R41841 Cognitive communication deficit: Secondary | ICD-10-CM

## 2023-10-31 DIAGNOSIS — M6281 Muscle weakness (generalized): Secondary | ICD-10-CM | POA: Diagnosis not present

## 2023-10-31 DIAGNOSIS — R262 Difficulty in walking, not elsewhere classified: Secondary | ICD-10-CM

## 2023-10-31 DIAGNOSIS — R2681 Unsteadiness on feet: Secondary | ICD-10-CM

## 2023-10-31 NOTE — Therapy (Signed)
 OUTPATIENT PHYSICAL THERAPY TREATMENT    Patient Name: Beverly [name] MRN: [id number] DOB:1985/04/08, 38 y.o., female Today's Date: 10/31/2023  PCP: [facility] REFERRING PROVIDER: Babs Arthea DASEN, MD  END OF SESSION:   PT End of Session - 10/31/23 1408     Visit Number 31    Number of Visits 41    Date for Recertification  11/13/23    Authorization Type UHC Dual Complete; Medicaid secondary    Authorization Time Period 05/30/23-08/22/23    Progress Note Due on Visit 40    PT Start Time 1404    PT Stop Time 1445    PT Time Calculation (min) 41 min    Equipment Utilized During Treatment Gait belt    Activity Tolerance Patient tolerated treatment well    Behavior During Therapy Flat affect;Impulsive;WFL for tasks assessed/performed            Past Medical History:  Diagnosis Date   Brain injury [facility])    CAD (coronary artery disease)    HLD (hyperlipidemia)    Hypertension    last pregnancy   Ischemic cardiomyopathy    STEMI (ST elevation myocardial infarction) (HCC) 09/2019   SCAD with aneurysmal dilation of proximal LAD.   Vaginal Pap smear, abnormal    when she was 38yo   Ventricular fibrillation (HCC) 09/23/2019   Past Surgical History:  Procedure Laterality Date   CARDIAC CATHETERIZATION     IR GASTROSTOMY TUBE MOD SED  10/07/2019   IR GASTROSTOMY TUBE REMOVAL  07/02/2020   IR REPLACE G-TUBE SIMPLE WO FLUORO  09/28/2023   IR REPLC GASTRO/COLONIC TUBE PERCUT W/FLUORO  11/03/2021   IR REPLC GASTRO/COLONIC TUBE PERCUT W/FLUORO  03/09/2023   LEFT HEART CATH AND CORONARY ANGIOGRAPHY N/A 09/23/2019   Procedure: LEFT HEART CATH AND CORONARY ANGIOGRAPHY;  Surgeon: Mady [name], MD;  Location: [facility] INVASIVE CV LAB;  Service: Cardiovascular;  Laterality: N/A;   SUBQ ICD IMPLANT N/A 11/26/2020   Procedure: SUBQ ICD IMPLANT;  Surgeon: [name], MD;  Location: [facility] INVASIVE CV LAB;  Service: Cardiovascular;  Laterality: N/A;   Patient  Active Problem List   Diagnosis Date Noted   Apraxia 10/05/2021   S/P ICD (internal cardiac defibrillator) procedure 11/27/2020   Hx of Cardiac arrest (HCC) 11/26/2020   Abnormality of gait 03/16/2020   Sleep disturbance 03/16/2020   Oropharyngeal dysphagia 02/12/2020   Coronary artery disease involving native coronary artery of native heart without angina pectoris 12/12/2019   Ischemic cardiomyopathy 12/12/2019   Brain injury (HCC)    Prediabetes    Anoxic brain injury (HCC) 11/05/2019   Dysphagia 11/05/2019   Physical deconditioning 11/05/2019   Acute delirium 11/05/2019   Respiratory failure (HCC)    Encounter for central line placement    Ventricular fibrillation (HCC) 09/23/2019   Acute combined systolic and diastolic heart failure (HCC) 09/23/2019   Ventricular tachycardia, polymorphic (HCC) 09/23/2019   Pelvic pain affecting pregnancy 10/15/2015   Supervision of normal pregnancy in third trimester 09/28/2015   Poor weight gain of pregnancy 09/28/2015   Iron  deficiency anemia of pregnancy 09/01/2015   Increased BMI (body mass index) 07/29/2015    ONSET DATE: August 2021  REFERRING DIAG:  G93.1 (ICD-10-CM) - Anoxic brain injury (HCC)  R48.2 (ICD-10-CM) - Apraxia    THERAPY DIAG:   Muscle weakness (generalized)  Other lack of coordination  Anoxic brain injury (HCC)  Difficulty in walking, not elsewhere classified  Other abnormalities of gait and mobility  Unsteadiness on  feet  Abnormality of gait and mobility  Cognitive communication deficit  Rationale for Evaluation and Treatment: Rehabilitation  SUBJECTIVE:                                                                                                                                                                                             SUBJECTIVE STATEMENT:   Pt denies anything new.  Pt denies any pain or falls.    Pt accompanied by: self and dad  PERTINENT HISTORY:   Patient is a 38 year  old with diagnois of anoxic brain injury. Pt known to PT clinic. Previously seen for outpatient services March 2024. Pt was receiving HH PT until October 2024 per her mother's report. [name] reports pt using HHA for ambulation, was using RW but she says was told not to do to difficulty with safe technique. Pt has fallen 1x in the past six months trying to ambulate on her own. Pt requires assistance with ADLs, including bathing and dressing. [name] reports pt never indicates any pain. Pt with severe aphasia so is unable to answer questions/pt's mom provides hx. She reports pt has impaired short-term memory, difficulty with stair climbing (has stair lift at home). She reports pt's BLE are weaker than they used to be. The pt is probably limited to <10 min with ambulation due to poor endurance. Pt reports her goal is to be able to use an AD to achieve mod-I mobility rather than relying on someone's HHA to ambulate.She was receiving outpatient PT until Feb. 2022 last year but ran out of visits. Patient has past medical history of Anoxic Brain Injury 09/2019, CAD, HLD, HTN, ISCHEMIC CARDIMYOPATHY, STEMI, V-FIB, ICD on 11/29/2020. Patient is currently living with mom. Mother reports she is abel to walk some with a walker with supervision but primarily ambulates with HHA of mother who is her guardian. Mother also reports she requires assist for all transfers and ADL's.   PAIN:  Are you having pain? No  PRECAUTIONS: Fall  WEIGHT BEARING RESTRICTIONS: No  FALLS: Has patient fallen in last 6 months? Yes. Number of falls 1  LIVING ENVIRONMENT: Lives with: lives with their family, mother Lives in: House/apartment Stairs: has stairs but stair lift installed, pt using lift Has following equipment at home: Environmental consultant - 2 wheeled, Wheelchair (manual), shower chair, and Grab bars, pt uses stair lift  PLOF: Needs assistance with ADLs  PATIENT GOALS: balance, walking, and pt states basketball  OBJECTIVE:   Note: Objective measures were completed at Evaluation unless otherwise noted.  DIAGNOSTIC FINDINGS:  None recent in chart  COGNITION: Overall cognitive status: impaired  SENSATION: Unable to complete testing due to impaired cognition, pt unable to report if sensation impairment present, pt mother unsure   EDEMA:  Mom reports pt's feet do swell from time to time   LOWER EXTREMITY MMT:  *accuracy of testing likely impacted by pt difficulty following cues  MMT Right Eval Left Eval  Hip flexion 4+ 4+  Hip extension    Hip abduction 4+ 4+  Hip adduction 4+ 4+  Hip internal rotation    Hip external rotation    Knee flexion 4+ 4+  Knee extension 5 5  Ankle dorsiflexion    Ankle plantarflexion    Ankle inversion    Ankle eversion    (Blank rows = not tested)  TRANSFERS: HHA  tried in session, very unsteady, pt has difficulty with motor planning, little success with cues due to distraction, very unsafe Attempted in session with RW, pt unsteady, gait ataxic, continued significant difficulty with motor planning requiring up to min a + 2 and multi-modal cues to safely sit in chair.   FUNCTIONAL TESTS:  5 times sit to stand: 1 min 43 sec Timed up and go (TUG): 1 min 30 sec with RW 10 meter walk test: 0.25 m/s with RW extensive cuing to complete activity  PATIENT SURVEYS:  SIS-16 filled out by pt's mother, used as questions relevant to pt: 45                                                                                                                              TREATMENT DATE: 10/31/2023  Pt arrived to therapy session in transport chair.  Unless otherwise stated, CGA/min A was provided and gait belt donned in order to ensure pt safety throughout session.   Ambulation above ground with use of the rolling walker, x2 laps, (300' total)  Above ground ambulation with use of 2 canes in each hand, x20 meter however had reduced control of the R UE and unable to control and so  this was disontinued  Above ground ambulation with 1# dumbbell in each UE, 40' total with better mobility but becoming fatigued in the R LE  Forward ambulation 30' then attempted to perform retro ambulation but unable to perform, so therapist had pt attempt side stepping, and pt again unable to sequence the motion to perform.  Pt very fatigued at the end of the session.   Patient continues to remain easily distractible in clinic environment, requiring cuing to refocus attention on current task, and relocating treatment area to less distracting area.  PATIENT EDUCATION: Education details: goal reassessment, indications, exercise technique Person educated: Patient and Parent Education method: Explanation, Demonstration, Tactile cues, and Verbal cues Education comprehension: returned demonstration, verbal cues required, tactile cues required, and needs further education  HOME EXERCISE PROGRAM: Access Code: [email or web address] URL: [email or web address] Date: 06/11/2023 Prepared by: Massie Dollar  Exercises - Standing March with Counter Support  - 1 x daily - 7 x weekly - 2 sets -  8 reps - Side Stepping with Counter Support  - 1 x daily - 7 x weekly - 2 sets - 4 reps - Sit to Stand with Armchair  - 1 x daily - 7 x weekly - 2 sets - 5 reps - Seated March  - 1 x daily - 7 x weekly - 3 sets - 10 reps  GOALS: Goals reviewed with patient? Yes   SHORT TERM GOALS: Target date: 07/11/2023  Patient will be independent in home exercise program to improve strength/mobility for better functional independence with ADLs. Baseline: Goal status: INITIAL   LONG TERM GOALS: Target date: 11/13/2023  1.  Patient will complete five times sit to stand test in < 35 seconds indicating an increased LE strength and improved balance. Baseline: previously 37 sec with UUE 04/19/22 today 05/30/23: 1 min 43 seconds; 6/3: 41 sec with improved technique (use of BUE on armrests);  08/21/2023= 38 sec with some  distractibility and VC for hand placement 7:23: 39.06 (41.23, 36.89 sec) 10/29/23: 56.85 with HHA; 1 min 16 sec with B UE on armrests Goal status: PARTIALLY MET  2. Patient will demo ability to ambulate > 400 ft w/ LRAD versus hand held assist min assist to demonstrate improved household mobility and short community distances.  Baseline: 4/28: 189ft with RW. Mod assist for AD management to prevent veer to the R. 6/3: ~ 300 ft with RW, requires cuing throughout for proximity to RW particularly with turns, but this has improved compared to eval;  08/21/2023= Patient ambulated approx 300 feet with RW- Sill some min assist at times to navigate walker and VC to remain close to walker.  7/23: 381ft with RW and min assist for safety and moderate instruction for safety AD intermittently, especially with fatigue  10/29/23:  Pt able to ambulate 500' with RW, however required modA at times to keep the walker stable Goal status: IN PROGRESS  3.  Patient will increase 10 meter walk test to >1.26m/s as to improve gait speed for better community ambulation and to reduce fall risk. Baseline: previously 0.36 m/s on 04/19/22 : 0.25 m/s;  07/10/23: 0. 24 m/s with RW;  08/15/2023= 0.39 m/s  with RW yet max VC for RW navigation.  7/23:Average Normal speed with RW: 0.25 m/s Average Normal speed with HHA: 0.73m/s 10/29/23: Average Normal speed with RW: 0.23 m/s Goal status: PROGRESSING  4.  Patient will reduce timed up and go (TUG) to <50 seconds to reduce fall risk and demonstrate improved transfer/gait ability Baseline: previously 91 sec with RW on 04/19/22; 05/30/23: 1 min 30 sec with RW 07/19/2023: 48.265 seconds using RW with skilled min A primarily for AD management with pt having difficulty turning with AD;  08/21/2023- 33 sec with HHA and 1 min 25 sec with RW (Max VC for safety with turning back to seat and to reach back safely)  7/23:42.18sec with RW and min assist for safety in turn; 40.35 sec with HHA and min assist  for safety with turns.  10/29/23: TBD Goal status: IN PROGRESS  5. Pt will exhibit correct and safe technique with stand>sit into a standard chair or transport chair to reduce risk of falling.   Baseline: pt current technique unsafe, poor motor planning; 6/3: pt with improved technique, bring RW closer to chair prior to sitting; 08/21/2023- Patient able to perform with CGA yet inconsistent with cues for safety and consistency with hand placement.  10/29/23: Pt still with inconsistent STS's, utilizing heavy UE assistance  Goal status:  PARTIALLY MET   6. The pt will demonstrate at least a 15 point improvement on SIS-16 to indicate increased ease with ADLs. Baseline: 45 (filled out by pt's mother); 42; 08/21/2023= 46; 09/03/2023: 46/80 10/29/23: TBD Goal status: ONGOING   ASSESSMENT:  CLINICAL IMPRESSION:   Pt very fatigued and had increased difficulty with the ambulation attempts, specifically with the retro ambulation.  Pt fatigued at the conclusion of the session and having increased clonus of the R LE.  Pt still recovering from increased fatigue over the past couple of visits.  Pt will continue to benefit from skilled therapy to address remaining deficits in order to improve overall QoL and return to PLOF.      OBJECTIVE IMPAIRMENTS: Abnormal gait, decreased activity tolerance, decreased balance, decreased coordination, decreased endurance, decreased knowledge of use of DME, decreased mobility, difficulty walking, decreased strength, decreased safety awareness, increased edema, impaired tone, improper body mechanics, and postural dysfunction.   ACTIVITY LIMITATIONS: carrying, lifting, bending, standing, squatting, stairs, transfers, bathing, dressing, locomotion level, and caring for others  PARTICIPATION LIMITATIONS: meal prep, cleaning, laundry, medication management, personal finances, driving, shopping, community activity, occupation, and yard work  PERSONAL FACTORS: Sex, Time since onset  of injury/illness/exacerbation, and 1-2 comorbidities: per chart PMH includes CAD, HTN, ischemic cardiomyopathy, STEMI, ventricular fibrillation, dysphagia, s/p ICD, apraxia  are also affecting patient's functional outcome.   REHAB POTENTIAL: Fair    CLINICAL DECISION MAKING: Evolving/moderate complexity  EVALUATION COMPLEXITY: High  PLAN:  PT FREQUENCY: 1-2x/week  PT DURATION: 12 weeks  PLANNED INTERVENTIONS: 97164- PT Re-evaluation, 97750- Physical Performance Testing, 97110-Therapeutic exercises, 97530- Therapeutic activity, 97112- Neuromuscular re-education, 97535- Self Care, [id number]- Manual therapy, [phone]- Gait training, [phone]- Orthotic Initial, [phone]- Orthotic/Prosthetic subsequent, [phone]- Canalith repositioning, Patient/Family education, Balance training, Stair training, Taping, Joint mobilization, Spinal mobilization, Vestibular training, DME instructions, Wheelchair mobility training, Cryotherapy, and Moist heat  PLAN FOR NEXT SESSION:    backwards gait training using RW with skilled assistance - perform in environment with limited distractions to allow improved motor learning Transfer training - learning sequence of AD management and stepping back to seat Gait training using RW in home-like environment Dynamic balance Reaching with use of Blaze Pods for tapping hands   Fonda Simpers, PT, DPT Physical Therapist - [facility]  10/31/23, 5:09 PM

## 2023-11-03 NOTE — Progress Notes (Signed)
 Remote ICD Transmission

## 2023-11-05 ENCOUNTER — Ambulatory Visit

## 2023-11-05 DIAGNOSIS — G931 Anoxic brain damage, not elsewhere classified: Secondary | ICD-10-CM

## 2023-11-05 DIAGNOSIS — R269 Unspecified abnormalities of gait and mobility: Secondary | ICD-10-CM

## 2023-11-05 DIAGNOSIS — R41841 Cognitive communication deficit: Secondary | ICD-10-CM

## 2023-11-05 DIAGNOSIS — M6281 Muscle weakness (generalized): Secondary | ICD-10-CM | POA: Diagnosis not present

## 2023-11-05 DIAGNOSIS — R278 Other lack of coordination: Secondary | ICD-10-CM

## 2023-11-05 DIAGNOSIS — R262 Difficulty in walking, not elsewhere classified: Secondary | ICD-10-CM

## 2023-11-05 DIAGNOSIS — R2689 Other abnormalities of gait and mobility: Secondary | ICD-10-CM

## 2023-11-05 DIAGNOSIS — R2681 Unsteadiness on feet: Secondary | ICD-10-CM

## 2023-11-05 NOTE — Therapy (Signed)
 OUTPATIENT PHYSICAL THERAPY TREATMENT    Patient Name: Beverly [name] MRN: [id number] DOB:08/09/1985, 38 y.o., female Today's Date: 11/05/2023  PCP: [facility] REFERRING PROVIDER: Babs Arthea DASEN, MD  END OF SESSION:   PT End of Session - 11/05/23 1404     Visit Number 32    Number of Visits 41    Date for Recertification  11/13/23    Authorization Type UHC Dual Complete; Medicaid secondary    Authorization Time Period 05/30/23-08/22/23    Progress Note Due on Visit 40    PT Start Time 1402    PT Stop Time 1445    PT Time Calculation (min) 43 min    Equipment Utilized During Treatment Gait belt    Activity Tolerance Patient tolerated treatment well    Behavior During Therapy Flat affect;Impulsive;WFL for tasks assessed/performed          Past Medical History:  Diagnosis Date   Brain injury [facility])    CAD (coronary artery disease)    HLD (hyperlipidemia)    Hypertension    last pregnancy   Ischemic cardiomyopathy    STEMI (ST elevation myocardial infarction) (HCC) 09/2019   SCAD with aneurysmal dilation of proximal LAD.   Vaginal Pap smear, abnormal    when she was 38yo   Ventricular fibrillation (HCC) 09/23/2019   Past Surgical History:  Procedure Laterality Date   CARDIAC CATHETERIZATION     IR GASTROSTOMY TUBE MOD SED  10/07/2019   IR GASTROSTOMY TUBE REMOVAL  07/02/2020   IR REPLACE G-TUBE SIMPLE WO FLUORO  09/28/2023   IR REPLC GASTRO/COLONIC TUBE PERCUT W/FLUORO  11/03/2021   IR REPLC GASTRO/COLONIC TUBE PERCUT W/FLUORO  03/09/2023   LEFT HEART CATH AND CORONARY ANGIOGRAPHY N/A 09/23/2019   Procedure: LEFT HEART CATH AND CORONARY ANGIOGRAPHY;  Surgeon: Mady [name], MD;  Location: [facility] INVASIVE CV LAB;  Service: Cardiovascular;  Laterality: N/A;   SUBQ ICD IMPLANT N/A 11/26/2020   Procedure: SUBQ ICD IMPLANT;  Surgeon: [name], MD;  Location: [facility] INVASIVE CV LAB;  Service: Cardiovascular;  Laterality: N/A;   Patient  Active Problem List   Diagnosis Date Noted   Apraxia 10/05/2021   S/P ICD (internal cardiac defibrillator) procedure 11/27/2020   Hx of Cardiac arrest (HCC) 11/26/2020   Abnormality of gait 03/16/2020   Sleep disturbance 03/16/2020   Oropharyngeal dysphagia 02/12/2020   Coronary artery disease involving native coronary artery of native heart without angina pectoris 12/12/2019   Ischemic cardiomyopathy 12/12/2019   Brain injury (HCC)    Prediabetes    Anoxic brain injury (HCC) 11/05/2019   Dysphagia 11/05/2019   Physical deconditioning 11/05/2019   Acute delirium 11/05/2019   Respiratory failure (HCC)    Encounter for central line placement    Ventricular fibrillation (HCC) 09/23/2019   Acute combined systolic and diastolic heart failure (HCC) 09/23/2019   Ventricular tachycardia, polymorphic (HCC) 09/23/2019   Pelvic pain affecting pregnancy 10/15/2015   Supervision of normal pregnancy in third trimester 09/28/2015   Poor weight gain of pregnancy 09/28/2015   Iron  deficiency anemia of pregnancy 09/01/2015   Increased BMI (body mass index) 07/29/2015    ONSET DATE: August 2021  REFERRING DIAG:  G93.1 (ICD-10-CM) - Anoxic brain injury (HCC)  R48.2 (ICD-10-CM) - Apraxia    THERAPY DIAG:   Muscle weakness (generalized)  Other lack of coordination  Anoxic brain injury (HCC)  Difficulty in walking, not elsewhere classified  Other abnormalities of gait and mobility  Unsteadiness on feet  Abnormality of gait and mobility  Cognitive communication deficit  Rationale for Evaluation and Treatment: Rehabilitation  SUBJECTIVE:                                                                                                                                                                                             SUBJECTIVE STATEMENT:   Pt denies doing anything over the weekend.  [name] reports that she did not even have company over the weekend.  Pt accompanied by:  self and mother.  PERTINENT HISTORY:   Patient is a 38 year old with diagnois of anoxic brain injury. Pt known to PT clinic. Previously seen for outpatient services March 2024. Pt was receiving HH PT until October 2024 per her mother's report. [name] reports pt using HHA for ambulation, was using RW but she says was told not to do to difficulty with safe technique. Pt has fallen 1x in the past six months trying to ambulate on her own. Pt requires assistance with ADLs, including bathing and dressing. [name] reports pt never indicates any pain. Pt with severe aphasia so is unable to answer questions/pt's mom provides hx. She reports pt has impaired short-term memory, difficulty with stair climbing (has stair lift at home). She reports pt's BLE are weaker than they used to be. The pt is probably limited to <10 min with ambulation due to poor endurance. Pt reports her goal is to be able to use an AD to achieve mod-I mobility rather than relying on someone's HHA to ambulate.She was receiving outpatient PT until Feb. 2022 last year but ran out of visits. Patient has past medical history of Anoxic Brain Injury 09/2019, CAD, HLD, HTN, ISCHEMIC CARDIMYOPATHY, STEMI, V-FIB, ICD on 11/29/2020. Patient is currently living with mom. Mother reports she is abel to walk some with a walker with supervision but primarily ambulates with HHA of mother who is her guardian. Mother also reports she requires assist for all transfers and ADL's.   PAIN:  Are you having pain? No  PRECAUTIONS: Fall  WEIGHT BEARING RESTRICTIONS: No  FALLS: Has patient fallen in last 6 months? Yes. Number of falls 1  LIVING ENVIRONMENT: Lives with: lives with their family, mother Lives in: House/apartment Stairs: has stairs but stair lift installed, pt using lift Has following equipment at home: Environmental consultant - 2 wheeled, Wheelchair (manual), shower chair, and Grab bars, pt uses stair lift  PLOF: Needs assistance with ADLs  PATIENT  GOALS: balance, walking, and pt states basketball  OBJECTIVE:  Note: Objective measures were completed at Evaluation unless otherwise noted.  DIAGNOSTIC FINDINGS:  None recent in chart  COGNITION: Overall cognitive status: impaired   SENSATION: Unable to complete testing due to impaired cognition, pt unable to report if sensation impairment present, pt mother unsure   EDEMA:  Mom reports pt's feet do swell from time to time   LOWER EXTREMITY MMT:  *accuracy of testing likely impacted by pt difficulty following cues  MMT Right Eval Left Eval  Hip flexion 4+ 4+  Hip extension    Hip abduction 4+ 4+  Hip adduction 4+ 4+  Hip internal rotation    Hip external rotation    Knee flexion 4+ 4+  Knee extension 5 5  Ankle dorsiflexion    Ankle plantarflexion    Ankle inversion    Ankle eversion    (Blank rows = not tested)  TRANSFERS: HHA  tried in session, very unsteady, pt has difficulty with motor planning, little success with cues due to distraction, very unsafe Attempted in session with RW, pt unsteady, gait ataxic, continued significant difficulty with motor planning requiring up to min a + 2 and multi-modal cues to safely sit in chair.   FUNCTIONAL TESTS:  5 times sit to stand: 1 min 43 sec Timed up and go (TUG): 1 min 30 sec with RW 10 meter walk test: 0.25 m/s with RW extensive cuing to complete activity  PATIENT SURVEYS:  SIS-16 filled out by pt's mother, used as questions relevant to pt: 45                                                                                                                              TREATMENT DATE: 11/05/2023  Pt arrived to therapy session in transport chair.  Unless otherwise stated, CGA/min A was provided and gait belt donned in order to ensure pt safety throughout session.  Above ground ambulation with 1# dumbbell in each UE, 30' continuously with a few standing breaks caused by distraction  Above ground ambulation with use of  // bars for support in both forward/retro x4 length  The patient completed 5 minutes at level 1 on the NuStep using both BUE/BLE reciprocal movements to promote strength, endurance, and cardiorespiratory fitness. PT increased the monitored the patient's response to the intervention throughout and provided verbal and visual cuing throughout for motivation and the motion necessary to complete the task. The patient required max cueing for technique and minimal level of assistance.   Patient continues to remain easily distractible in clinic environment, requiring cuing to refocus attention on current task, and relocating treatment area to less distracting area.    PATIENT EDUCATION: Education details: goal reassessment, indications, exercise technique Person educated: Patient and Parent Education method: Explanation, Demonstration, Tactile cues, and Verbal cues Education comprehension: returned demonstration, verbal cues required, tactile cues required, and needs further education  HOME EXERCISE PROGRAM: Access Code: [email or web address] URL: [email or web address] Date: 06/11/2023 Prepared by: Massie Dollar  Exercises - Standing March with Counter Support  - 1 x daily - 7 x weekly - 2 sets -  8 reps - Side Stepping with Counter Support  - 1 x daily - 7 x weekly - 2 sets - 4 reps - Sit to Stand with Armchair  - 1 x daily - 7 x weekly - 2 sets - 5 reps - Seated March  - 1 x daily - 7 x weekly - 3 sets - 10 reps  GOALS: Goals reviewed with patient? Yes   SHORT TERM GOALS: Target date: 07/11/2023  Patient will be independent in home exercise program to improve strength/mobility for better functional independence with ADLs. Baseline: Goal status: INITIAL   LONG TERM GOALS: Target date: 11/13/2023  1.  Patient will complete five times sit to stand test in < 35 seconds indicating an increased LE strength and improved balance. Baseline: previously 37 sec with UUE 04/19/22 today 05/30/23: 1  min 43 seconds; 6/3: 41 sec with improved technique (use of BUE on armrests);  08/21/2023= 38 sec with some distractibility and VC for hand placement 7:23: 39.06 (41.23, 36.89 sec) 10/29/23: 56.85 with HHA; 1 min 16 sec with B UE on armrests Goal status: PARTIALLY MET  2. Patient will demo ability to ambulate > 400 ft w/ LRAD versus hand held assist min assist to demonstrate improved household mobility and short community distances.  Baseline: 4/28: 14ft with RW. Mod assist for AD management to prevent veer to the R. 6/3: ~ 300 ft with RW, requires cuing throughout for proximity to RW particularly with turns, but this has improved compared to eval;  08/21/2023= Patient ambulated approx 300 feet with RW- Sill some min assist at times to navigate walker and VC to remain close to walker.  7/23: 328ft with RW and min assist for safety and moderate instruction for safety AD intermittently, especially with fatigue  10/29/23:  Pt able to ambulate 500' with RW, however required modA at times to keep the walker stable Goal status: IN PROGRESS  3.  Patient will increase 10 meter walk test to >1.55m/s as to improve gait speed for better community ambulation and to reduce fall risk. Baseline: previously 0.36 m/s on 04/19/22 : 0.25 m/s;  07/10/23: 0. 24 m/s with RW;  08/15/2023= 0.39 m/s  with RW yet max VC for RW navigation.  7/23:Average Normal speed with RW: 0.25 m/s Average Normal speed with HHA: 0.10m/s 10/29/23: Average Normal speed with RW: 0.23 m/s Goal status: PROGRESSING  4.  Patient will reduce timed up and go (TUG) to <50 seconds to reduce fall risk and demonstrate improved transfer/gait ability Baseline: previously 91 sec with RW on 04/19/22; 05/30/23: 1 min 30 sec with RW 07/19/2023: 48.265 seconds using RW with skilled min A primarily for AD management with pt having difficulty turning with AD;  08/21/2023- 33 sec with HHA and 1 min 25 sec with RW (Max VC for safety with turning back to seat and to reach  back safely)  7/23:42.18sec with RW and min assist for safety in turn; 40.35 sec with HHA and min assist for safety with turns.  10/29/23: TBD Goal status: IN PROGRESS  5. Pt will exhibit correct and safe technique with stand>sit into a standard chair or transport chair to reduce risk of falling.   Baseline: pt current technique unsafe, poor motor planning; 6/3: pt with improved technique, bring RW closer to chair prior to sitting; 08/21/2023- Patient able to perform with CGA yet inconsistent with cues for safety and consistency with hand placement.  10/29/23: Pt still with inconsistent STS's, utilizing heavy UE assistance  Goal status:  PARTIALLY MET   6. The pt will demonstrate at least a 15 point improvement on SIS-16 to indicate increased ease with ADLs. Baseline: 45 (filled out by pt's mother); 42; 08/21/2023= 46; 09/03/2023: 46/80 10/29/23: TBD Goal status: ONGOING   ASSESSMENT:  CLINICAL IMPRESSION:   Pt responded well to the treatment session, however was much more easily distracted and initially did not want to be in therapy.  Pt did demonstrate improved retro ambulation when placed in the // bars, however the carryover is lacking.  Pt also introduced to the NuStep in order to improve coordination and endurance training with reciprocal movement as pt still struggles to bring arms with her when mobilizing either forward/retro.  Will continue to improve in retro gait attempts along with transfer training in the future.   Pt will continue to benefit from skilled therapy to address remaining deficits in order to improve overall QoL and return to PLOF.       OBJECTIVE IMPAIRMENTS: Abnormal gait, decreased activity tolerance, decreased balance, decreased coordination, decreased endurance, decreased knowledge of use of DME, decreased mobility, difficulty walking, decreased strength, decreased safety awareness, increased edema, impaired tone, improper body mechanics, and postural dysfunction.    ACTIVITY LIMITATIONS: carrying, lifting, bending, standing, squatting, stairs, transfers, bathing, dressing, locomotion level, and caring for others  PARTICIPATION LIMITATIONS: meal prep, cleaning, laundry, medication management, personal finances, driving, shopping, community activity, occupation, and yard work  PERSONAL FACTORS: Sex, Time since onset of injury/illness/exacerbation, and 1-2 comorbidities: per chart PMH includes CAD, HTN, ischemic cardiomyopathy, STEMI, ventricular fibrillation, dysphagia, s/p ICD, apraxia  are also affecting patient's functional outcome.   REHAB POTENTIAL: Fair    CLINICAL DECISION MAKING: Evolving/moderate complexity  EVALUATION COMPLEXITY: High  PLAN:  PT FREQUENCY: 1-2x/week  PT DURATION: 12 weeks  PLANNED INTERVENTIONS: 97164- PT Re-evaluation, 97750- Physical Performance Testing, 97110-Therapeutic exercises, 97530- Therapeutic activity, 97112- Neuromuscular re-education, 97535- Self Care, [id number]- Manual therapy, [phone]- Gait training, [phone]- Orthotic Initial, [phone]- Orthotic/Prosthetic subsequent, [phone]- Canalith repositioning, Patient/Family education, Balance training, Stair training, Taping, Joint mobilization, Spinal mobilization, Vestibular training, DME instructions, Wheelchair mobility training, Cryotherapy, and Moist heat  PLAN FOR NEXT SESSION:   backwards gait training using RW with skilled assistance - perform in environment with limited distractions to allow improved motor learning Transfer training - learning sequence of AD management and stepping back to seat Gait training using RW in home-like environment Dynamic balance Reaching with use of Blaze Pods for tapping hands   Fonda Simpers, PT, DPT Physical Therapist - [facility]  11/05/23, 5:13 PM

## 2023-11-06 ENCOUNTER — Other Ambulatory Visit: Payer: Self-pay | Admitting: Physical Medicine & Rehabilitation

## 2023-11-06 ENCOUNTER — Other Ambulatory Visit: Payer: Self-pay | Admitting: Cardiology

## 2023-11-06 DIAGNOSIS — G931 Anoxic brain damage, not elsewhere classified: Secondary | ICD-10-CM

## 2023-11-06 DIAGNOSIS — R1312 Dysphagia, oropharyngeal phase: Secondary | ICD-10-CM

## 2023-11-06 NOTE — Therapy (Signed)
 Occupational Therapy Outpatient Neuro Treatment Note  Patient Name: Beverly [name] MRN: [id number] DOB:[birth year], 38 y.o., female Today's Date: 11/06/2023   REFERRING PROVIDER: Babs Hussar, MD  END OF SESSION:  OT End of Session - 11/06/23 0825     Visit Number 12    Number of Visits 22    Date for Recertification  01/07/24    OT Start Time 1315    OT Stop Time 1400    OT Time Calculation (min) 45 min    Equipment Utilized During Treatment transport chair    Activity Tolerance Patient tolerated treatment well    Behavior During Therapy WFL for tasks assessed/performed         Past Medical History:  Diagnosis Date   Brain injury (HCC)    CAD (coronary artery disease)    HLD (hyperlipidemia)    Hypertension    last pregnancy   Ischemic cardiomyopathy    STEMI (ST elevation myocardial infarction) (HCC) 09/2019   SCAD with aneurysmal dilation of proximal LAD.   Vaginal Pap smear, abnormal    when she was 38yo   Ventricular fibrillation (HCC) 09/23/2019   Past Surgical History:  Procedure Laterality Date   CARDIAC CATHETERIZATION     IR GASTROSTOMY TUBE MOD SED  10/07/2019   IR GASTROSTOMY TUBE REMOVAL  07/02/2020   IR REPLACE G-TUBE SIMPLE WO FLUORO  09/28/2023   IR REPLC GASTRO/COLONIC TUBE PERCUT W/FLUORO  11/03/2021   IR REPLC GASTRO/COLONIC TUBE PERCUT W/FLUORO  03/09/2023   LEFT HEART CATH AND CORONARY ANGIOGRAPHY N/A 09/23/2019   Procedure: LEFT HEART CATH AND CORONARY ANGIOGRAPHY;  Surgeon: Mady [name], MD;  Location: [facility] INVASIVE CV LAB;  Service: Cardiovascular;  Laterality: N/A;   SUBQ ICD IMPLANT N/A 11/26/2020   Procedure: SUBQ ICD IMPLANT;  Surgeon: [name], MD;  Location: [facility] INVASIVE CV LAB;  Service: Cardiovascular;  Laterality: N/A;   Patient Active Problem List   Diagnosis Date Noted   Apraxia 10/05/2021   S/P ICD (internal cardiac defibrillator) procedure 11/27/2020   Hx of Cardiac arrest (HCC) 11/26/2020   Abnormality of gait  03/16/2020   Sleep disturbance 03/16/2020   Oropharyngeal dysphagia 02/12/2020   Coronary artery disease involving native coronary artery of native heart without angina pectoris 12/12/2019   Ischemic cardiomyopathy 12/12/2019   Brain injury (HCC)    Prediabetes    Anoxic brain injury (HCC) 11/05/2019   Dysphagia 11/05/2019   Physical deconditioning 11/05/2019   Acute delirium 11/05/2019   Respiratory failure (HCC)    Encounter for central line placement    Ventricular fibrillation (HCC) 09/23/2019   Acute combined systolic and diastolic heart failure (HCC) 09/23/2019   Ventricular tachycardia, polymorphic (HCC) 09/23/2019   Pelvic pain affecting pregnancy 10/15/2015   Supervision of normal pregnancy in third trimester 09/28/2015   Poor weight gain of pregnancy 09/28/2015   Iron  deficiency anemia of pregnancy 09/01/2015   Increased BMI (body mass index) 07/29/2015   ONSET DATE: 09/23/19  REFERRING DIAG: Anoxic Brain Injury  THERAPY DIAG:  Muscle weakness (generalized)  Other lack of coordination  Anoxic brain injury (HCC)  Rationale for Evaluation and Treatment: Rehabilitation  SUBJECTIVE:  SUBJECTIVE STATEMENT: Mother inquired about ways to help strengthening pt's grip at home (see below).  Pt accompanied by:  Mother  PERTINENT HISTORY:  Pt. Is a 38 y.o. female with a history of severe aphasia  from a Cardiac Arrest leading to Anoxic Brain Injury on 09/23/19 at 2 weeks postpartum. Pt. Currently hs a G-Tube  in place. PMHx includes: CAD, HLD, HTN, Ischemic cardiomyopathy, STEMI, V-Fib, and ICD on 11/29/20.  PRECAUTIONS: None  WEIGHT BEARING RESTRICTIONS: No  PAIN:  Are you having pain? No  FALLS: Has patient fallen in last 6 months? No  LIVING ENVIRONMENT: Lives with: mother Lives in: House/apartment Stairs:  two story; No steps to enter. 10 steps inside, and has a chair lift Has following equipment at home: Walker - 2 wheeled and Wheelchair (manual), stair chair  lift  PLOF: Independent  PATIENT GOALS: To improve bilateral hand function for increased engagement in ADLs, and IADL tasks.  OBJECTIVE:  Note: Objective measures were completed at Evaluation unless otherwise noted.  HAND DOMINANCE: Left  ADLs: Per father report  Transfers/ambulation related to ADLs: Eating:  Dependent/feeding tube Grooming: Independent washing face, assist with hair care  UB Dressing: Assist with donning shirt LB Dressing: Assist with donning LE clothing  Toileting: Assist with transfers, toilet hygiene skills, and clothing negotiation Bathing:  Unknown  IADLs: Per father report Shopping: Total A Light housekeeping: Total A Meal Prep: Total A Medication management:   Total A Financial management: Total A Handwriting: 50% legible printing name only using 1 inch letters.  MOBILITY STATUS: Needs Assist: using walker  POSTURE COMMENTS:   Sitting balance:  fair  ACTIVITY TOLERANCE: Activity tolerance:  Fair   FUNCTIONAL OUTCOME MEASURES: TBD  UPPER EXTREMITY ROM:    Active ROM Right Eval WFL Left Eval [facility]  Shoulder flexion    Shoulder abduction    Shoulder adduction    Shoulder extension    Shoulder internal rotation    Shoulder external rotation    Elbow flexion    Elbow extension    Wrist flexion    Wrist extension    Wrist ulnar deviation    Wrist radial deviation    Wrist pronation    Wrist supination    (Blank rows = not tested)  UPPER EXTREMITY MMT:     MMT Right eval Right 10/15/23 Left eval Left 10/15/23  Shoulder flexion 3/5 4-/5 3+/5 4-/5  Shoulder abduction 3/5 4-/5 3+/5 4-/5  Shoulder adduction      Shoulder extension      Shoulder internal rotation      Shoulder external rotation      Middle trapezius      Lower trapezius      Elbow flexion 3/5 4-/5 3+/5 4-/5  Elbow extension 3/5 4+/5 3+/5 4-/5  Wrist flexion      Wrist extension 3/5 3+/5 3+/5 4-/5  Wrist ulnar deviation      Wrist radial deviation      Wrist  pronation      Wrist supination      (Blank rows = not tested)  HAND FUNCTION: Grip strength: Right: 10 lbs; Left: 18 lbs, Lateral pinch: Right: 5 lbs, Left: 7 lbs.  10/15/23:  Grip strength: Right: 15 lbs; Left: 25 lbs, Lateral pinch: Right: 4 lbs, Left: 11 lbs.   COORDINATION:  Eval:  Nine Hole Peg test: Pt. was able to formulate isolated 2nd digit extension in anticipation for grasping the horizontal pegs.  10/15/2023:   9 Hole Peg Test:   -Pt. was unable to grasp the pegs from a horizontal position, and transition them to a vertical position in preparation for placing them into the pegboard.   -To remove pegs only: Right: 1 min. & 45 sec.;  Left: 1 min. & 16 sec.   SENSATION: Light touch intact  EDEMA: N/A   COGNITION: Overall  cognitive status: impaired   VISION:  Baseline:  wears glasses.   VISION ASSESSMENT: To be further assessed in functional context  PERCEPTION: WFL  PRAXIS: WFL                                                                                                       TREATMENT DATE: 11/05/2023 Self Care: -Donning/doffing tshirt:   -Mod A for donning and doffing, step by step cues required  -3 trials for donning/doffing shirt using arms first technique, then overhead -Max vc and tactile cues for sequencing/problem solving  -HEP review with mother   Therapeutic Activity: -Grip strengthening addressed with towel tug of war.  Pt practiced bimanually and R/L individually, pulling therapist toward pt (therapist on rolling stool)  PATIENT EDUCATION: Education details:  HEP recommendations Person educated: Parent Education method: Explanation and Verbal cues Education comprehension: verbalized understanding  HOME EXERCISE PROGRAM:  -increasing opportunities for engaging the bilateral hands during ADL tasks.  GOALS: Goals reviewed with patient? Yes  SHORT TERM GOALS: Target date: 11/26/2023  Pt. Will require supervision HEPs for  BUE strengthening  Baseline: Eval: No current HEP. Goal status: INITIAL   LONG TERM GOALS: Target date: 01/07/2024   Pt. Will increase BUE strength by 2 mm grades to assist with ADLs, and IADLs. Baseline: 10/15/23: RUE: Right shoulder flexion:  4-/5, abduction: 4-/5, elbow flexion: 4-/5, elbow extension: 4+/5, wrist extension: 3+/5; LUE: 4-/5 overall Eval:  RUE: 3/5 overall, LUE: 3+/5 overall Goal status: Progressing/Ongoing  2.  Pt. will improve bilateral grip strength by 5# to be able to securely hold items in her hands. Baseline: 10/15/23: Grip Right: 15#, Left: 25# Eval: Grip Right: 10#, Left: 18# Goal status: Progressing/Ongoing  3.  Pt. Will improve bilateral lateral pinch strength by 3# to assist with grooming tasks. Baseline: 10/15/23: lateral pinch strength:  R: 4#, L: 11# Eval: R: 4#, L: 7# Goal status: Progressing/Ongoing  4.  Pt. Will improve bilateral hand FMC grasping, and placing one peg in the 9 hole peg test in preparation for improved manipulation of small objects. Baseline: 10/15/23: 9 Hole Peg Test: Removing pegs only: R: 1 min. & 45 sec.; L: 1 min. &16 sec. Eval: Pt. is able to initiate formulating isolated 2nd digit extension in preparation for grasping small objects. Goal status: Progressing/Ongoing  5.  Pt.  Will write her name her full name with 75% legibility. Baseline: 10/15/23: Continue Eval: 50% legible printing name only using 1 inch letters. Goal status: Ongoing  6. Pt.  Will demonstrate modified techniques for ADLs with minA.  Baseline: 10/15/23: requires mod/maxA Eval: Does not currently use adaptive, or modified techniques during ADLS.  Goal status: ongoing  7.  Pt will brush teeth with set up and mod A for thoroughness.  Baseline: 10/15/23: MaxA 08/29/23: Dep  Goal status:Ongoing  8.  Pt will don tshirt with min A.  Baseline: 10/15/23: ModA per mother. 08/29/23: Max-dep; mother reports pt gets her shirt turned around and often struggles to pull over her  head  Goal status: Ongoing  9. Pt will spit  saliva into cup using cup with handle and wipe mouth with washcloth/hand towel with supv-min A.  Baseline: 10/15/23: Mod-maxA 08/29/23: Max-dep; pt squeezes plastic cup too hard/paper cup slips from hands.  Pt struggles to wipe mouth thoroughly with napkin or tissue.  Goal status: Ongoing   ASSESSMENT:  CLINICAL IMPRESSION: Pt showed improved ability to help with pulling down shirt front/back with contrasting colors of her tank top and tshirt, though pt continues to require max vc/tactile cues for sequencing and problem solving strategies when shirt became stuck on pony tail at top of head, or if arms were lost in her shirt sleeves.  Mom inquired about grip strengthening strategies for pt.  Initial brief trial with hand gripper (rubber bands), with pt requiring constant hand over hand assist to maintain grip and sequence through this exercises.  Pt responded well to towel tug of war, and showed contentment with this activity.  Encouraged this activity at home for grip strengthening using towel, sheet, or rope.  Pt. continues to work on improving BUE functioning, and ADL/IADL tasks. Pt. continues to benefit from OT services to maximize pt's participation in ADLs, increase ADL safety, and provide caregiver education as needed to ease caregiver assist with ADLs.   PERFORMANCE DEFICITS: in functional skills including ADLs, IADLs, coordination, dexterity, proprioception, ROM, strength, pain, Fine motor control, and UE functional use, cognitive skills including attention, memory, problem solving, and safety awareness, and psychosocial skills including coping strategies, environmental adaptation, and routines and behaviors.   IMPAIRMENTS: are limiting patient from ADLs, IADLs, and leisure.   CO-MORBIDITIES: may have co-morbidities  that affects occupational performance. Patient will benefit from skilled OT to address above impairments and improve overall  function.  MODIFICATION OR ASSISTANCE TO COMPLETE EVALUATION: Min-Moderate modification of tasks or assist with assess necessary to complete an evaluation.  OT OCCUPATIONAL PROFILE AND HISTORY: Detailed assessment: Review of records and additional review of physical, cognitive, psychosocial history related to current functional performance.  CLINICAL DECISION MAKING: Moderate - several treatment options, min-mod task modification necessary  REHAB POTENTIAL: Good  EVALUATION COMPLEXITY: Moderate    PLAN:  OT FREQUENCY: 1x/week  OT DURATION: 12 weeks  PLANNED INTERVENTIONS: 97535 self care/ADL training, 02889 therapeutic exercise, 97530 therapeutic activity, 97112 neuromuscular re-education, 97140 manual therapy, 97018 paraffin, 02989 moist heat, 97760 Orthotic Initial, 97761 Prosthetic Initial, 97763 Orthotic/Prosthetic subsequent, functional mobility training, visual/perceptual remediation/compensation, energy conservation, patient/family education, and DME and/or AE instructions  RECOMMENDED OTHER SERVICES: PT/ST  CONSULTED AND AGREED WITH PLAN OF CARE: Patient  PLAN FOR NEXT SESSION: Treatment  Inocente Blazing, MS, OTR/L

## 2023-11-07 ENCOUNTER — Ambulatory Visit: Attending: Physical Medicine & Rehabilitation

## 2023-11-07 ENCOUNTER — Encounter

## 2023-11-07 DIAGNOSIS — R269 Unspecified abnormalities of gait and mobility: Secondary | ICD-10-CM | POA: Diagnosis present

## 2023-11-07 DIAGNOSIS — M6281 Muscle weakness (generalized): Secondary | ICD-10-CM | POA: Insufficient documentation

## 2023-11-07 DIAGNOSIS — R2681 Unsteadiness on feet: Secondary | ICD-10-CM | POA: Insufficient documentation

## 2023-11-07 DIAGNOSIS — R41841 Cognitive communication deficit: Secondary | ICD-10-CM | POA: Diagnosis present

## 2023-11-07 DIAGNOSIS — R2689 Other abnormalities of gait and mobility: Secondary | ICD-10-CM | POA: Diagnosis present

## 2023-11-07 DIAGNOSIS — R278 Other lack of coordination: Secondary | ICD-10-CM | POA: Insufficient documentation

## 2023-11-07 DIAGNOSIS — R262 Difficulty in walking, not elsewhere classified: Secondary | ICD-10-CM | POA: Diagnosis present

## 2023-11-07 DIAGNOSIS — G931 Anoxic brain damage, not elsewhere classified: Secondary | ICD-10-CM | POA: Diagnosis present

## 2023-11-07 NOTE — Therapy (Signed)
 OUTPATIENT PHYSICAL THERAPY TREATMENT    Patient Name: Beverly [name] MRN: [id number] DOB:31-Oct-1985, 38 y.o., female Today's Date: 11/07/2023  PCP: [facility] REFERRING PROVIDER: Babs Arthea DASEN, MD  END OF SESSION:   PT End of Session - 11/07/23 1406     Visit Number 33    Number of Visits 41    Date for Recertification  11/13/23    Authorization Type UHC Dual Complete; Medicaid secondary    Authorization Time Period 05/30/23-08/22/23    Progress Note Due on Visit 40    PT Start Time 1405    PT Stop Time 1445    PT Time Calculation (min) 40 min    Equipment Utilized During Treatment Gait belt    Activity Tolerance Patient tolerated treatment well    Behavior During Therapy Flat affect;Impulsive;WFL for tasks assessed/performed         Past Medical History:  Diagnosis Date   Brain injury [facility])    CAD (coronary artery disease)    HLD (hyperlipidemia)    Hypertension    last pregnancy   Ischemic cardiomyopathy    STEMI (ST elevation myocardial infarction) (HCC) 09/2019   SCAD with aneurysmal dilation of proximal LAD.   Vaginal Pap smear, abnormal    when she was 38yo   Ventricular fibrillation (HCC) 09/23/2019   Past Surgical History:  Procedure Laterality Date   CARDIAC CATHETERIZATION     IR GASTROSTOMY TUBE MOD SED  10/07/2019   IR GASTROSTOMY TUBE REMOVAL  07/02/2020   IR REPLACE G-TUBE SIMPLE WO FLUORO  09/28/2023   IR REPLC GASTRO/COLONIC TUBE PERCUT W/FLUORO  11/03/2021   IR REPLC GASTRO/COLONIC TUBE PERCUT W/FLUORO  03/09/2023   LEFT HEART CATH AND CORONARY ANGIOGRAPHY N/A 09/23/2019   Procedure: LEFT HEART CATH AND CORONARY ANGIOGRAPHY;  Surgeon: Mady [name], MD;  Location: [facility] INVASIVE CV LAB;  Service: Cardiovascular;  Laterality: N/A;   SUBQ ICD IMPLANT N/A 11/26/2020   Procedure: SUBQ ICD IMPLANT;  Surgeon: [name], MD;  Location: [facility] INVASIVE CV LAB;  Service: Cardiovascular;  Laterality: N/A;   Patient  Active Problem List   Diagnosis Date Noted   Apraxia 10/05/2021   S/P ICD (internal cardiac defibrillator) procedure 11/27/2020   Hx of Cardiac arrest (HCC) 11/26/2020   Abnormality of gait 03/16/2020   Sleep disturbance 03/16/2020   Oropharyngeal dysphagia 02/12/2020   Coronary artery disease involving native coronary artery of native heart without angina pectoris 12/12/2019   Ischemic cardiomyopathy 12/12/2019   Brain injury (HCC)    Prediabetes    Anoxic brain injury (HCC) 11/05/2019   Dysphagia 11/05/2019   Physical deconditioning 11/05/2019   Acute delirium 11/05/2019   Respiratory failure (HCC)    Encounter for central line placement    Ventricular fibrillation (HCC) 09/23/2019   Acute combined systolic and diastolic heart failure (HCC) 09/23/2019   Ventricular tachycardia, polymorphic (HCC) 09/23/2019   Pelvic pain affecting pregnancy 10/15/2015   Supervision of normal pregnancy in third trimester 09/28/2015   Poor weight gain of pregnancy 09/28/2015   Iron  deficiency anemia of pregnancy 09/01/2015   Increased BMI (body mass index) 07/29/2015    ONSET DATE: August 2021  REFERRING DIAG:  G93.1 (ICD-10-CM) - Anoxic brain injury (HCC)  R48.2 (ICD-10-CM) - Apraxia    THERAPY DIAG:   Muscle weakness (generalized)  Other lack of coordination  Anoxic brain injury (HCC)  Difficulty in walking, not elsewhere classified  Other abnormalities of gait and mobility  Unsteadiness on feet  Abnormality  of gait and mobility  Cognitive communication deficit  Rationale for Evaluation and Treatment: Rehabilitation  SUBJECTIVE:                                                                                                                                                                                             SUBJECTIVE STATEMENT:   Pt reports no pain upon arrival to the clinic.  Pt reports she has not been doing much of anything since the last visit.  Pt accompanied  by: self and dad.  PERTINENT HISTORY:   Patient is a 38 year old with diagnois of anoxic brain injury. Pt known to PT clinic. Previously seen for outpatient services March 2024. Pt was receiving HH PT until October 2024 per her mother's report. [name] reports pt using HHA for ambulation, was using RW but she says was told not to do to difficulty with safe technique. Pt has fallen 1x in the past six months trying to ambulate on her own. Pt requires assistance with ADLs, including bathing and dressing. [name] reports pt never indicates any pain. Pt with severe aphasia so is unable to answer questions/pt's mom provides hx. She reports pt has impaired short-term memory, difficulty with stair climbing (has stair lift at home). She reports pt's BLE are weaker than they used to be. The pt is probably limited to <10 min with ambulation due to poor endurance. Pt reports her goal is to be able to use an AD to achieve mod-I mobility rather than relying on someone's HHA to ambulate.She was receiving outpatient PT until Feb. 2022 last year but ran out of visits. Patient has past medical history of Anoxic Brain Injury 09/2019, CAD, HLD, HTN, ISCHEMIC CARDIMYOPATHY, STEMI, V-FIB, ICD on 11/29/2020. Patient is currently living with mom. Mother reports she is abel to walk some with a walker with supervision but primarily ambulates with HHA of mother who is her guardian. Mother also reports she requires assist for all transfers and ADL's.   PAIN:  Are you having pain? No  PRECAUTIONS: Fall  WEIGHT BEARING RESTRICTIONS: No  FALLS: Has patient fallen in last 6 months? Yes. Number of falls 1  LIVING ENVIRONMENT: Lives with: lives with their family, mother Lives in: House/apartment Stairs: has stairs but stair lift installed, pt using lift Has following equipment at home: Environmental consultant - 2 wheeled, Wheelchair (manual), shower chair, and Grab bars, pt uses stair lift  PLOF: Needs assistance with ADLs  PATIENT  GOALS: balance, walking, and pt states basketball  OBJECTIVE:  Note: Objective measures were completed at Evaluation unless otherwise noted.  DIAGNOSTIC FINDINGS:  None recent  in chart  COGNITION: Overall cognitive status: impaired   SENSATION: Unable to complete testing due to impaired cognition, pt unable to report if sensation impairment present, pt mother unsure   EDEMA:  Mom reports pt's feet do swell from time to time   LOWER EXTREMITY MMT:  *accuracy of testing likely impacted by pt difficulty following cues  MMT Right Eval Left Eval  Hip flexion 4+ 4+  Hip extension    Hip abduction 4+ 4+  Hip adduction 4+ 4+  Hip internal rotation    Hip external rotation    Knee flexion 4+ 4+  Knee extension 5 5  Ankle dorsiflexion    Ankle plantarflexion    Ankle inversion    Ankle eversion    (Blank rows = not tested)  TRANSFERS: HHA  tried in session, very unsteady, pt has difficulty with motor planning, little success with cues due to distraction, very unsafe Attempted in session with RW, pt unsteady, gait ataxic, continued significant difficulty with motor planning requiring up to min a + 2 and multi-modal cues to safely sit in chair.   FUNCTIONAL TESTS:  5 times sit to stand: 1 min 43 sec Timed up and go (TUG): 1 min 30 sec with RW 10 meter walk test: 0.25 m/s with RW extensive cuing to complete activity  PATIENT SURVEYS:  SIS-16 filled out by pt's mother, used as questions relevant to pt: 45                                                                                                                              TREATMENT DATE: 11/07/2023  Pt arrived to therapy session in transport chair.  Unless otherwise stated, CGA/min A was provided and gait belt donned in order to ensure pt safety throughout session.  Above ground ambulation RW around the gym 1 full lap (150') without rest break  Blocked practice with ambulation to chair placed 10' in front to work on  STS, ambulation with RW, and working on 180 deg turns to transition to sitting position.  Pt struggled with this task and consistently requires multi-modal cuing for proper performance and to keep the walker close by.  Pt performed this task x6 total attempts  Ambulation with weaving in/out of 4 total cones being spaced ~4' apart, x3 total attempts  STS 2x5  Patient continues to remain easily distractible in clinic environment, requiring cuing to refocus attention on current task, and relocating treatment area to less distracting area.    PATIENT EDUCATION: Education details: goal reassessment, indications, exercise technique Person educated: Patient and Parent Education method: Explanation, Demonstration, Tactile cues, and Verbal cues Education comprehension: returned demonstration, verbal cues required, tactile cues required, and needs further education  HOME EXERCISE PROGRAM: Access Code: [email or web address] URL: [email or web address] Date: 06/11/2023 Prepared by: Massie Dollar  Exercises - Standing March with Counter Support  - 1 x daily - 7 x weekly - 2 sets - 8 reps - Side Stepping  with Counter Support  - 1 x daily - 7 x weekly - 2 sets - 4 reps - Sit to Stand with Armchair  - 1 x daily - 7 x weekly - 2 sets - 5 reps - Seated March  - 1 x daily - 7 x weekly - 3 sets - 10 reps  GOALS: Goals reviewed with patient? Yes   SHORT TERM GOALS: Target date: 07/11/2023  Patient will be independent in home exercise program to improve strength/mobility for better functional independence with ADLs. Baseline: Goal status: INITIAL   LONG TERM GOALS: Target date: 11/13/2023  1.  Patient will complete five times sit to stand test in < 35 seconds indicating an increased LE strength and improved balance. Baseline: previously 37 sec with UUE 04/19/22 today 05/30/23: 1 min 43 seconds; 6/3: 41 sec with improved technique (use of BUE on armrests);  08/21/2023= 38 sec with some distractibility  and VC for hand placement 7:23: 39.06 (41.23, 36.89 sec) 10/29/23: 56.85 with HHA; 1 min 16 sec with B UE on armrests Goal status: PARTIALLY MET  2. Patient will demo ability to ambulate > 400 ft w/ LRAD versus hand held assist min assist to demonstrate improved household mobility and short community distances.  Baseline: 4/28: 185ft with RW. Mod assist for AD management to prevent veer to the R. 6/3: ~ 300 ft with RW, requires cuing throughout for proximity to RW particularly with turns, but this has improved compared to eval;  08/21/2023= Patient ambulated approx 300 feet with RW- Sill some min assist at times to navigate walker and VC to remain close to walker.  7/23: 36ft with RW and min assist for safety and moderate instruction for safety AD intermittently, especially with fatigue  10/29/23:  Pt able to ambulate 500' with RW, however required modA at times to keep the walker stable Goal status: IN PROGRESS  3.  Patient will increase 10 meter walk test to >1.85m/s as to improve gait speed for better community ambulation and to reduce fall risk. Baseline: previously 0.36 m/s on 04/19/22 : 0.25 m/s;  07/10/23: 0. 24 m/s with RW;  08/15/2023= 0.39 m/s  with RW yet max VC for RW navigation.  7/23:Average Normal speed with RW: 0.25 m/s Average Normal speed with HHA: 0.96m/s 10/29/23: Average Normal speed with RW: 0.23 m/s Goal status: PROGRESSING  4.  Patient will reduce timed up and go (TUG) to <50 seconds to reduce fall risk and demonstrate improved transfer/gait ability Baseline: previously 91 sec with RW on 04/19/22; 05/30/23: 1 min 30 sec with RW 07/19/2023: 48.265 seconds using RW with skilled min A primarily for AD management with pt having difficulty turning with AD;  08/21/2023- 33 sec with HHA and 1 min 25 sec with RW (Max VC for safety with turning back to seat and to reach back safely)  7/23:42.18sec with RW and min assist for safety in turn; 40.35 sec with HHA and min assist for safety with  turns.  10/29/23: TBD Goal status: IN PROGRESS  5. Pt will exhibit correct and safe technique with stand>sit into a standard chair or transport chair to reduce risk of falling.   Baseline: pt current technique unsafe, poor motor planning; 6/3: pt with improved technique, bring RW closer to chair prior to sitting; 08/21/2023- Patient able to perform with CGA yet inconsistent with cues for safety and consistency with hand placement.  10/29/23: Pt still with inconsistent STS's, utilizing heavy UE assistance  Goal status: PARTIALLY MET   6.  The pt will demonstrate at least a 15 point improvement on SIS-16 to indicate increased ease with ADLs. Baseline: 45 (filled out by pt's mother); 42; 08/21/2023= 46; 09/03/2023: 46/80 10/29/23: TBD Goal status: ONGOING   ASSESSMENT:  CLINICAL IMPRESSION:   Pt introduced to new tasks today and was able to perform them, however struggled with many of the transitions.  Pt still having difficult with retro ambulation and leaving walker out front, along with rotations/transitions.  Pt encouraged to mobilize at home with assistance to build up LE strengthening.   Pt will continue to benefit from skilled therapy to address remaining deficits in order to improve overall QoL and return to PLOF.        OBJECTIVE IMPAIRMENTS: Abnormal gait, decreased activity tolerance, decreased balance, decreased coordination, decreased endurance, decreased knowledge of use of DME, decreased mobility, difficulty walking, decreased strength, decreased safety awareness, increased edema, impaired tone, improper body mechanics, and postural dysfunction.   ACTIVITY LIMITATIONS: carrying, lifting, bending, standing, squatting, stairs, transfers, bathing, dressing, locomotion level, and caring for others  PARTICIPATION LIMITATIONS: meal prep, cleaning, laundry, medication management, personal finances, driving, shopping, community activity, occupation, and yard work  PERSONAL FACTORS: Sex,  Time since onset of injury/illness/exacerbation, and 1-2 comorbidities: per chart PMH includes CAD, HTN, ischemic cardiomyopathy, STEMI, ventricular fibrillation, dysphagia, s/p ICD, apraxia  are also affecting patient's functional outcome.   REHAB POTENTIAL: Fair    CLINICAL DECISION MAKING: Evolving/moderate complexity  EVALUATION COMPLEXITY: High  PLAN:  PT FREQUENCY: 1-2x/week  PT DURATION: 12 weeks  PLANNED INTERVENTIONS: 97164- PT Re-evaluation, 97750- Physical Performance Testing, 97110-Therapeutic exercises, 97530- Therapeutic activity, 97112- Neuromuscular re-education, 97535- Self Care, [id number]- Manual therapy, [phone]- Gait training, [phone]- Orthotic Initial, [phone]- Orthotic/Prosthetic subsequent, [phone]- Canalith repositioning, Patient/Family education, Balance training, Stair training, Taping, Joint mobilization, Spinal mobilization, Vestibular training, DME instructions, Wheelchair mobility training, Cryotherapy, and Moist heat  PLAN FOR NEXT SESSION:   backwards gait training using RW with skilled assistance - perform in environment with limited distractions to allow improved motor learning Transfer training - learning sequence of AD management and stepping back to seat Gait training using RW in home-like environment Dynamic balance Reaching with use of Blaze Pods for tapping hands   Fonda Simpers, PT, DPT Physical Therapist - [facility]  11/07/23, 3:56 PM

## 2023-11-12 ENCOUNTER — Ambulatory Visit

## 2023-11-12 DIAGNOSIS — R2681 Unsteadiness on feet: Secondary | ICD-10-CM

## 2023-11-12 DIAGNOSIS — G931 Anoxic brain damage, not elsewhere classified: Secondary | ICD-10-CM

## 2023-11-12 DIAGNOSIS — R2689 Other abnormalities of gait and mobility: Secondary | ICD-10-CM

## 2023-11-12 DIAGNOSIS — R278 Other lack of coordination: Secondary | ICD-10-CM

## 2023-11-12 DIAGNOSIS — M6281 Muscle weakness (generalized): Secondary | ICD-10-CM | POA: Diagnosis not present

## 2023-11-12 DIAGNOSIS — R41841 Cognitive communication deficit: Secondary | ICD-10-CM

## 2023-11-12 DIAGNOSIS — R269 Unspecified abnormalities of gait and mobility: Secondary | ICD-10-CM

## 2023-11-12 DIAGNOSIS — R262 Difficulty in walking, not elsewhere classified: Secondary | ICD-10-CM

## 2023-11-12 NOTE — Therapy (Signed)
 Occupational Therapy Outpatient Neuro Treatment Note  Patient Name: Beverly [name] MRN: [id number] DOB:18-May-1985, 38 y.o., female Today's Date: 11/12/2023  REFERRING PROVIDER: Babs Hussar, MD  END OF SESSION:  OT End of Session - 11/12/23 1403     Visit Number 13    Number of Visits 22    Date for Recertification  01/07/24    OT Start Time 1315    OT Stop Time 1400    OT Time Calculation (min) 45 min    Equipment Utilized During Treatment transport chair    Activity Tolerance Patient tolerated treatment well    Behavior During Therapy WFL for tasks assessed/performed         Past Medical History:  Diagnosis Date   Brain injury (HCC)    CAD (coronary artery disease)    HLD (hyperlipidemia)    Hypertension    last pregnancy   Ischemic cardiomyopathy    STEMI (ST elevation myocardial infarction) (HCC) 09/2019   SCAD with aneurysmal dilation of proximal LAD.   Vaginal Pap smear, abnormal    when she was 38yo   Ventricular fibrillation (HCC) 09/23/2019   Past Surgical History:  Procedure Laterality Date   CARDIAC CATHETERIZATION     IR GASTROSTOMY TUBE MOD SED  10/07/2019   IR GASTROSTOMY TUBE REMOVAL  07/02/2020   IR REPLACE G-TUBE SIMPLE WO FLUORO  09/28/2023   IR REPLC GASTRO/COLONIC TUBE PERCUT W/FLUORO  11/03/2021   IR REPLC GASTRO/COLONIC TUBE PERCUT W/FLUORO  03/09/2023   LEFT HEART CATH AND CORONARY ANGIOGRAPHY N/A 09/23/2019   Procedure: LEFT HEART CATH AND CORONARY ANGIOGRAPHY;  Surgeon: Mady [name], MD;  Location: [facility] INVASIVE CV LAB;  Service: Cardiovascular;  Laterality: N/A;   SUBQ ICD IMPLANT N/A 11/26/2020   Procedure: SUBQ ICD IMPLANT;  Surgeon: [name], MD;  Location: [facility] INVASIVE CV LAB;  Service: Cardiovascular;  Laterality: N/A;   Patient Active Problem List   Diagnosis Date Noted   Apraxia 10/05/2021   S/P ICD (internal cardiac defibrillator) procedure 11/27/2020   Hx of Cardiac arrest (HCC) 11/26/2020   Abnormality of gait  03/16/2020   Sleep disturbance 03/16/2020   Oropharyngeal dysphagia 02/12/2020   Coronary artery disease involving native coronary artery of native heart without angina pectoris 12/12/2019   Ischemic cardiomyopathy 12/12/2019   Brain injury (HCC)    Prediabetes    Anoxic brain injury (HCC) 11/05/2019   Dysphagia 11/05/2019   Physical deconditioning 11/05/2019   Acute delirium 11/05/2019   Respiratory failure (HCC)    Encounter for central line placement    Ventricular fibrillation (HCC) 09/23/2019   Acute combined systolic and diastolic heart failure (HCC) 09/23/2019   Ventricular tachycardia, polymorphic (HCC) 09/23/2019   Pelvic pain affecting pregnancy 10/15/2015   Supervision of normal pregnancy in third trimester 09/28/2015   Poor weight gain of pregnancy 09/28/2015   Iron  deficiency anemia of pregnancy 09/01/2015   Increased BMI (body mass index) 07/29/2015   ONSET DATE: 09/23/19  REFERRING DIAG: Anoxic Brain Injury  THERAPY DIAG:  Muscle weakness (generalized)  Other lack of coordination  Anoxic brain injury (HCC)  Rationale for Evaluation and Treatment: Rehabilitation  SUBJECTIVE:  SUBJECTIVE STATEMENT: Mother reports that she is continuing to have pt help with brushing teeth and getting dressed, but she notices that pt struggles to reach behind her back to help pull her shirt down.  Pt accompanied by:  Mother  PERTINENT HISTORY:  Pt. Is a 38 y.o. female with a history of severe aphasia  from  a Cardiac Arrest leading to Anoxic Brain Injury on 09/23/19 at 2 weeks postpartum. Pt. Currently hs a G-Tube in place. PMHx includes: CAD, HLD, HTN, Ischemic cardiomyopathy, STEMI, V-Fib, and ICD on 11/29/20.  PRECAUTIONS: None  WEIGHT BEARING RESTRICTIONS: No  PAIN:  Are you having pain? No (Faces scale)  FALLS: Has patient fallen in last 6 months? No  LIVING ENVIRONMENT: Lives with: mother Lives in: House/apartment Stairs:  two story; No steps to enter. 10 steps  inside, and has a chair lift Has following equipment at home: Walker - 2 wheeled and Wheelchair (manual), stair chair lift  PLOF: Independent  PATIENT GOALS: To improve bilateral hand function for increased engagement in ADLs, and IADL tasks.  OBJECTIVE:  Note: Objective measures were completed at Evaluation unless otherwise noted.  HAND DOMINANCE: Left  ADLs: Per father report  Transfers/ambulation related to ADLs: Eating:  Dependent/feeding tube Grooming: Independent washing face, assist with hair care  UB Dressing: Assist with donning shirt LB Dressing: Assist with donning LE clothing  Toileting: Assist with transfers, toilet hygiene skills, and clothing negotiation Bathing:  Unknown  IADLs: Per father report Shopping: Total A Light housekeeping: Total A Meal Prep: Total A Medication management:   Total A Financial management: Total A Handwriting: 50% legible printing name only using 1 inch letters.  MOBILITY STATUS: Needs Assist: using walker  POSTURE COMMENTS:   Sitting balance:  fair  ACTIVITY TOLERANCE: Activity tolerance:  Fair   FUNCTIONAL OUTCOME MEASURES: TBD  UPPER EXTREMITY ROM:    Active ROM Right Eval WFL Left Eval [facility]  Shoulder flexion    Shoulder abduction    Shoulder adduction    Shoulder extension    Shoulder internal rotation    Shoulder external rotation    Elbow flexion    Elbow extension    Wrist flexion    Wrist extension    Wrist ulnar deviation    Wrist radial deviation    Wrist pronation    Wrist supination    (Blank rows = not tested)  UPPER EXTREMITY MMT:     MMT Right eval Right 10/15/23 Left eval Left 10/15/23  Shoulder flexion 3/5 4-/5 3+/5 4-/5  Shoulder abduction 3/5 4-/5 3+/5 4-/5  Shoulder adduction      Shoulder extension      Shoulder internal rotation      Shoulder external rotation      Middle trapezius      Lower trapezius      Elbow flexion 3/5 4-/5 3+/5 4-/5  Elbow extension 3/5 4+/5 3+/5 4-/5   Wrist flexion      Wrist extension 3/5 3+/5 3+/5 4-/5  Wrist ulnar deviation      Wrist radial deviation      Wrist pronation      Wrist supination      (Blank rows = not tested)  HAND FUNCTION: Grip strength: Right: 10 lbs; Left: 18 lbs, Lateral pinch: Right: 5 lbs, Left: 7 lbs.  10/15/23:  Grip strength: Right: 15 lbs; Left: 25 lbs, Lateral pinch: Right: 4 lbs, Left: 11 lbs.   COORDINATION:  Eval:  Nine Hole Peg test: Pt. was able to formulate isolated 2nd digit extension in anticipation for grasping the horizontal pegs.  10/15/2023:   9 Hole Peg Test:   -Pt. was unable to grasp the pegs from a horizontal position, and transition them to a vertical position in preparation for placing them into the pegboard.   -To remove pegs only: Right: 1 min. & 45  sec.;  Left: 1 min. & 16 sec.   SENSATION: Light touch intact  EDEMA: N/A   COGNITION: Overall cognitive status: impaired   VISION:  Baseline:  wears glasses.  VISION ASSESSMENT: To be further assessed in functional context  PERCEPTION: WFL  PRAXIS: WFL                                                                                                       TREATMENT DATE: 11/12/2023 Self Care: -Practiced pulling shirt down behind back, alternating reach with R/L and then bilaterally, requiring hand over hand assist to initiate and set up shirt in palms to gather more of the shirt rather than pinching only bottom hem of the shirt, as this leads to shirt slipping from hands. -Practiced hand to face patterns with face lotion application.  Sequencing cues with use of visual feedback from mirror.  Used dry washcloth to rub lotion in more thoroughly. -Practiced reps of bringing spit cup to mouth to discard saliva, and subsequent towel drying with either hand using dry washcloth; hand over hand cueing, and min A to maintain cup to mouth to prevent spitting onto clothing  Therapeutic Activity: -Promoted R/L active IR behind  back reaching for cards from a deck, working towards improved reach behind back for pulling shirt down.  Therapeutic Exercise: -Passive bilat shoulder stretching: R/L IR behind back, alternating between pronated and supinated forearm positions  PATIENT EDUCATION: Education details:  ADL training Person educated: Parent Education method: Explanation and Verbal cues Education comprehension: verbalized understanding  HOME EXERCISE PROGRAM:  -increasing opportunities for engaging the bilateral hands during ADL tasks.  GOALS: Goals reviewed with patient? Yes  SHORT TERM GOALS: Target date: 11/26/2023  Pt. Will require supervision HEPs for BUE strengthening  Baseline: Eval: No current HEP. Goal status: INITIAL   LONG TERM GOALS: Target date: 01/07/2024   Pt. Will increase BUE strength by 2 mm grades to assist with ADLs, and IADLs. Baseline: 10/15/23: RUE: Right shoulder flexion:  4-/5, abduction: 4-/5, elbow flexion: 4-/5, elbow extension: 4+/5, wrist extension: 3+/5; LUE: 4-/5 overall Eval:  RUE: 3/5 overall, LUE: 3+/5 overall Goal status: Progressing/Ongoing  2.  Pt. will improve bilateral grip strength by 5# to be able to securely hold items in her hands. Baseline: 10/15/23: Grip Right: 15#, Left: 25# Eval: Grip Right: 10#, Left: 18# Goal status: Progressing/Ongoing  3.  Pt. Will improve bilateral lateral pinch strength by 3# to assist with grooming tasks. Baseline: 10/15/23: lateral pinch strength:  R: 4#, L: 11# Eval: R: 4#, L: 7# Goal status: Progressing/Ongoing  4.  Pt. Will improve bilateral hand FMC grasping, and placing one peg in the 9 hole peg test in preparation for improved manipulation of small objects. Baseline: 10/15/23: 9 Hole Peg Test: Removing pegs only: R: 1 min. & 45 sec.; L: 1 min. &16 sec. Eval: Pt. is able to initiate formulating isolated 2nd digit extension in preparation for grasping small objects. Goal status: Progressing/Ongoing  5.  Pt.  Will write her  name her full name with 75% legibility. Baseline: 10/15/23: Continue  Eval: 50% legible printing name only using 1 inch letters. Goal status: Ongoing  6. Pt.  Will demonstrate modified techniques for ADLs with minA.  Baseline: 10/15/23: requires mod/maxA Eval: Does not currently use adaptive, or modified techniques during ADLS.  Goal status: ongoing  7.  Pt will brush teeth with set up and mod A for thoroughness.  Baseline: 10/15/23: MaxA 08/29/23: Dep  Goal status:Ongoing  8.  Pt will don tshirt with min A.  Baseline: 10/15/23: ModA per mother. 08/29/23: Max-dep; mother reports pt gets her shirt turned around and often struggles to pull over her head  Goal status: Ongoing  9. Pt will spit saliva into cup using cup with handle and wipe mouth with washcloth/hand towel with supv-min A.  Baseline: 10/15/23: Mod-maxA 08/29/23: Max-dep; pt squeezes plastic cup too hard/paper cup slips from hands.  Pt struggles to wipe mouth thoroughly with napkin or tissue.  Goal status: Ongoing   ASSESSMENT:  CLINICAL IMPRESSION: Focus today on UB ADLs and reach behind back with either arm to pull shirt down.  Pt tolerated all activities and stretching well.  Pt able to reach to lower back with the RUE initially with min A, progressing to CGA to direct reach to grasp a card behind her.  LUE required mod A to coordinate arm position, as pt attempts to initiate reach with ER behind head rather than IR behind back.  Constant hand over hand assist to direct LUE behind back to grasp a card.  Pt benefited from visual feedback for face lotion application.  Pt practiced rubbing lotion in with open palms, but tends to resort to use of 1 or 2 fingertips with thumb opposed to digits 3-5, struggling to maintain digit extension.  Pt required use of dry washcloth to thoroughly rub lotion into face.  Continued encouragement for mom to engage pt into brushing teeth and donning shirt, acknowledging that mom will need to provide frequent  cues for redirection, and hand over hand assist to initiate or improve thoroughness with each step.  Pt continues to make attempts at holding her cup to discard her spit, but frequently misses the cup.  Mom wondered if pt may not be able to tell how close the cup is to her mouth.  OT in agreement that impaired spatial awareness and lack of coordination appear to be primary deficits contributing to pt struggling to direct cup to mouth.  OT has encouraged continued pt participation with lotion application, washing face, brushing teeth, and hand over hand assist to direct spit cup to mouth to continue to improve spatial awareness with hand to face patterns.  Continue to recommend cup with handle/non breakable cup for greater indep with this task, as pt continues to squeeze plastic cup without graded pressure which tends to crack the cup.  Pt. continues to work on improving BUE functioning, and ADL/IADL tasks. Pt. continues to benefit from OT services to maximize pt's participation in ADLs, increase ADL safety, and provide caregiver education as needed to ease caregiver assist with ADLs.   PERFORMANCE DEFICITS: in functional skills including ADLs, IADLs, coordination, dexterity, proprioception, ROM, strength, pain, Fine motor control, and UE functional use, cognitive skills including attention, memory, problem solving, and safety awareness, and psychosocial skills including coping strategies, environmental adaptation, and routines and behaviors.   IMPAIRMENTS: are limiting patient from ADLs, IADLs, and leisure.   CO-MORBIDITIES: may have co-morbidities  that affects occupational performance. Patient will benefit from skilled OT to address above impairments and improve overall function.  MODIFICATION OR ASSISTANCE TO COMPLETE EVALUATION: Min-Moderate modification of tasks or assist with assess necessary to complete an evaluation.  OT OCCUPATIONAL PROFILE AND HISTORY: Detailed assessment: Review of records and  additional review of physical, cognitive, psychosocial history related to current functional performance.  CLINICAL DECISION MAKING: Moderate - several treatment options, min-mod task modification necessary  REHAB POTENTIAL: Good  EVALUATION COMPLEXITY: Moderate    PLAN:  OT FREQUENCY: 1x/week  OT DURATION: 12 weeks  PLANNED INTERVENTIONS: 97535 self care/ADL training, 02889 therapeutic exercise, 97530 therapeutic activity, 97112 neuromuscular re-education, 97140 manual therapy, 97018 paraffin, 02989 moist heat, 97760 Orthotic Initial, 97761 Prosthetic Initial, 97763 Orthotic/Prosthetic subsequent, functional mobility training, visual/perceptual remediation/compensation, energy conservation, patient/family education, and DME and/or AE instructions  RECOMMENDED OTHER SERVICES: PT/ST  CONSULTED AND AGREED WITH PLAN OF CARE: Patient  PLAN FOR NEXT SESSION: Treatment  Inocente Blazing, MS, OTR/L

## 2023-11-12 NOTE — Therapy (Signed)
 OUTPATIENT PHYSICAL THERAPY TREATMENT    Patient Name: LIANNI KANAAN MRN: [id number] DOB:Aug 03, 1985, 38 y.o., female Today's Date: 11/12/2023  PCP: [facility] REFERRING PROVIDER: Babs Arthea DASEN, MD  END OF SESSION:   PT End of Session - 11/12/23 1404     Visit Number 34    Number of Visits 41    Date for Recertification  11/13/23    Authorization Type UHC Dual Complete; Medicaid secondary    Authorization Time Period 05/30/23-08/22/23    Progress Note Due on Visit 40    PT Start Time 1202    PT Stop Time 1245    PT Time Calculation (min) 43 min    Equipment Utilized During Treatment Gait belt    Activity Tolerance Patient tolerated treatment well    Behavior During Therapy Flat affect;Impulsive;WFL for tasks assessed/performed          Past Medical History:  Diagnosis Date   Brain injury [facility])    CAD (coronary artery disease)    HLD (hyperlipidemia)    Hypertension    last pregnancy   Ischemic cardiomyopathy    STEMI (ST elevation myocardial infarction) (HCC) 09/2019   SCAD with aneurysmal dilation of proximal LAD.   Vaginal Pap smear, abnormal    when she was 38yo   Ventricular fibrillation (HCC) 09/23/2019   Past Surgical History:  Procedure Laterality Date   CARDIAC CATHETERIZATION     IR GASTROSTOMY TUBE MOD SED  10/07/2019   IR GASTROSTOMY TUBE REMOVAL  07/02/2020   IR REPLACE G-TUBE SIMPLE WO FLUORO  09/28/2023   IR REPLC GASTRO/COLONIC TUBE PERCUT W/FLUORO  11/03/2021   IR REPLC GASTRO/COLONIC TUBE PERCUT W/FLUORO  03/09/2023   LEFT HEART CATH AND CORONARY ANGIOGRAPHY N/A 09/23/2019   Procedure: LEFT HEART CATH AND CORONARY ANGIOGRAPHY;  Surgeon: Mady [name], MD;  Location: [facility] INVASIVE CV LAB;  Service: Cardiovascular;  Laterality: N/A;   SUBQ ICD IMPLANT N/A 11/26/2020   Procedure: SUBQ ICD IMPLANT;  Surgeon: [name], MD;  Location: [facility] INVASIVE CV LAB;  Service: Cardiovascular;  Laterality: N/A;   Patient  Active Problem List   Diagnosis Date Noted   Apraxia 10/05/2021   S/P ICD (internal cardiac defibrillator) procedure 11/27/2020   Hx of Cardiac arrest (HCC) 11/26/2020   Abnormality of gait 03/16/2020   Sleep disturbance 03/16/2020   Oropharyngeal dysphagia 02/12/2020   Coronary artery disease involving native coronary artery of native heart without angina pectoris 12/12/2019   Ischemic cardiomyopathy 12/12/2019   Brain injury (HCC)    Prediabetes    Anoxic brain injury (HCC) 11/05/2019   Dysphagia 11/05/2019   Physical deconditioning 11/05/2019   Acute delirium 11/05/2019   Respiratory failure (HCC)    Encounter for central line placement    Ventricular fibrillation (HCC) 09/23/2019   Acute combined systolic and diastolic heart failure (HCC) 09/23/2019   Ventricular tachycardia, polymorphic (HCC) 09/23/2019   Pelvic pain affecting pregnancy 10/15/2015   Supervision of normal pregnancy in third trimester 09/28/2015   Poor weight gain of pregnancy 09/28/2015   Iron  deficiency anemia of pregnancy 09/01/2015   Increased BMI (body mass index) 07/29/2015    ONSET DATE: August 2021  REFERRING DIAG:  G93.1 (ICD-10-CM) - Anoxic brain injury (HCC)  R48.2 (ICD-10-CM) - Apraxia    THERAPY DIAG:   Muscle weakness (generalized)  Other lack of coordination  Anoxic brain injury (HCC)  Difficulty in walking, not elsewhere classified  Other abnormalities of gait and mobility  Unsteadiness on feet  Abnormality of gait and mobility  Cognitive communication deficit  Rationale for Evaluation and Treatment: Rehabilitation  SUBJECTIVE:                                                                                                                                                                                             SUBJECTIVE STATEMENT:   Pt reports she just came from OT and it was the same as it usually is.  Pt otherwise is doing well.    Pt accompanied by: self and  mother.  PERTINENT HISTORY:   Patient is a 38 year old with diagnois of anoxic brain injury. Pt known to PT clinic. Previously seen for outpatient services March 2024. Pt was receiving HH PT until October 2024 per her mother's report. [name] reports pt using HHA for ambulation, was using RW but she says was told not to do to difficulty with safe technique. Pt has fallen 1x in the past six months trying to ambulate on her own. Pt requires assistance with ADLs, including bathing and dressing. [name] reports pt never indicates any pain. Pt with severe aphasia so is unable to answer questions/pt's mom provides hx. She reports pt has impaired short-term memory, difficulty with stair climbing (has stair lift at home). She reports pt's BLE are weaker than they used to be. The pt is probably limited to <10 min with ambulation due to poor endurance. Pt reports her goal is to be able to use an AD to achieve mod-I mobility rather than relying on someone's HHA to ambulate.She was receiving outpatient PT until Feb. 2022 last year but ran out of visits. Patient has past medical history of Anoxic Brain Injury 09/2019, CAD, HLD, HTN, ISCHEMIC CARDIMYOPATHY, STEMI, V-FIB, ICD on 11/29/2020. Patient is currently living with mom. Mother reports she is abel to walk some with a walker with supervision but primarily ambulates with HHA of mother who is her guardian. Mother also reports she requires assist for all transfers and ADL's.   PAIN:  Are you having pain? No  PRECAUTIONS: Fall  WEIGHT BEARING RESTRICTIONS: No  FALLS: Has patient fallen in last 6 months? Yes. Number of falls 1  LIVING ENVIRONMENT: Lives with: lives with their family, mother Lives in: House/apartment Stairs: has stairs but stair lift installed, pt using lift Has following equipment at home: Environmental consultant - 2 wheeled, Wheelchair (manual), shower chair, and Grab bars, pt uses stair lift  PLOF: Needs assistance with ADLs  PATIENT GOALS:  balance, walking, and pt states basketball  OBJECTIVE:  Note: Objective measures were completed at Evaluation unless otherwise noted.  DIAGNOSTIC FINDINGS:  None  recent in chart  COGNITION: Overall cognitive status: impaired   SENSATION: Unable to complete testing due to impaired cognition, pt unable to report if sensation impairment present, pt mother unsure   EDEMA:  Mom reports pt's feet do swell from time to time   LOWER EXTREMITY MMT:  *accuracy of testing likely impacted by pt difficulty following cues  MMT Right Eval Left Eval  Hip flexion 4+ 4+  Hip extension    Hip abduction 4+ 4+  Hip adduction 4+ 4+  Hip internal rotation    Hip external rotation    Knee flexion 4+ 4+  Knee extension 5 5  Ankle dorsiflexion    Ankle plantarflexion    Ankle inversion    Ankle eversion    (Blank rows = not tested)  TRANSFERS: HHA  tried in session, very unsteady, pt has difficulty with motor planning, little success with cues due to distraction, very unsafe Attempted in session with RW, pt unsteady, gait ataxic, continued significant difficulty with motor planning requiring up to min a + 2 and multi-modal cues to safely sit in chair.   FUNCTIONAL TESTS:  5 times sit to stand: 1 min 43 sec Timed up and go (TUG): 1 min 30 sec with RW 10 meter walk test: 0.25 m/s with RW extensive cuing to complete activity  PATIENT SURVEYS:  SIS-16 filled out by pt's mother, used as questions relevant to pt: 45                                                                                                                              TREATMENT DATE: 11/12/2023   Pt arrived to therapy session in transport chair.  Unless otherwise stated, CGA/min A was provided and gait belt donned in order to ensure pt safety throughout session.  Above ground ambulation RW around the gym 1 full lap (150') without rest break   Activity 1: The Blaze Pod Random setting was chosen to enhance cognitive  processing and agility, providing an unpredictable environment to simulate real-world scenarios, and fostering quick reactions and adaptability.    Activity Description: 3 pods on mirror at eye level, 3 pods on floor, pt hitting the lit blaze pod Activity Setting:  Random Number of Pods:  6 Cycles/Sets:  1 cycle/set Duration (Time or Hit Count):  20 hits  Patient Stats  Reaction Time:  4 min 13    Activity 2 & 3: The Blaze Pod Focus setting was selected to refine precision and concentration, isolating specific muscle groups or movements to enhance overall coordination and targeted muscle engagement.  Activity Description: 3 pods on mirror at eye level, 3 pods on floor, pt hitting the red blaze pod with 6 other distracting colors Activity Setting:  Focus Number of Pods:  6 Cycles/Sets:  2 Duration (Time or Hit Count):  20 hits  Patient Stats  Reaction Time:  5 min 16 sec; 11 min 3 sec   Transfer training and  ambulation backwards to promote increased balance, x5  Patient continues to remain easily distractible in clinic environment, requiring cuing to refocus attention on current task, and relocating treatment area to less distracting area.    PATIENT EDUCATION: Education details: goal reassessment, indications, exercise technique Person educated: Patient and Parent Education method: Explanation, Demonstration, Tactile cues, and Verbal cues Education comprehension: returned demonstration, verbal cues required, tactile cues required, and needs further education  HOME EXERCISE PROGRAM: Access Code: [email or web address] URL: [email or web address] Date: 06/11/2023 Prepared by: Massie Dollar  Exercises - Standing March with Counter Support  - 1 x daily - 7 x weekly - 2 sets - 8 reps - Side Stepping with Counter Support  - 1 x daily - 7 x weekly - 2 sets - 4 reps - Sit to Stand with Armchair  - 1 x daily - 7 x weekly - 2 sets - 5 reps - Seated March  - 1 x daily - 7 x  weekly - 3 sets - 10 reps  GOALS: Goals reviewed with patient? Yes   SHORT TERM GOALS: Target date: 07/11/2023  Patient will be independent in home exercise program to improve strength/mobility for better functional independence with ADLs. Baseline: Goal status: INITIAL   LONG TERM GOALS: Target date: 11/13/2023  1.  Patient will complete five times sit to stand test in < 35 seconds indicating an increased LE strength and improved balance. Baseline: previously 37 sec with UUE 04/19/22 today 05/30/23: 1 min 43 seconds; 6/3: 41 sec with improved technique (use of BUE on armrests);  08/21/2023= 38 sec with some distractibility and VC for hand placement 7:23: 39.06 (41.23, 36.89 sec) 10/29/23: 56.85 with HHA; 1 min 16 sec with B UE on armrests Goal status: PARTIALLY MET  2. Patient will demo ability to ambulate > 400 ft w/ LRAD versus hand held assist min assist to demonstrate improved household mobility and short community distances.  Baseline: 4/28: 167ft with RW. Mod assist for AD management to prevent veer to the R. 6/3: ~ 300 ft with RW, requires cuing throughout for proximity to RW particularly with turns, but this has improved compared to eval;  08/21/2023= Patient ambulated approx 300 feet with RW- Sill some min assist at times to navigate walker and VC to remain close to walker.  7/23: 326ft with RW and min assist for safety and moderate instruction for safety AD intermittently, especially with fatigue  10/29/23:  Pt able to ambulate 500' with RW, however required modA at times to keep the walker stable Goal status: IN PROGRESS  3.  Patient will increase 10 meter walk test to >1.42m/s as to improve gait speed for better community ambulation and to reduce fall risk. Baseline: previously 0.36 m/s on 04/19/22 : 0.25 m/s;  07/10/23: 0. 24 m/s with RW;  08/15/2023= 0.39 m/s  with RW yet max VC for RW navigation.  7/23:Average Normal speed with RW: 0.25 m/s Average Normal speed with HHA:  0.65m/s 10/29/23: Average Normal speed with RW: 0.23 m/s Goal status: PROGRESSING  4.  Patient will reduce timed up and go (TUG) to <50 seconds to reduce fall risk and demonstrate improved transfer/gait ability Baseline: previously 91 sec with RW on 04/19/22; 05/30/23: 1 min 30 sec with RW 07/19/2023: 48.265 seconds using RW with skilled min A primarily for AD management with pt having difficulty turning with AD;  08/21/2023- 33 sec with HHA and 1 min 25 sec with RW (Max VC for safety with turning back to seat and to reach  back safely)  7/23:42.18sec with RW and min assist for safety in turn; 40.35 sec with HHA and min assist for safety with turns.  10/29/23: TBD Goal status: IN PROGRESS  5. Pt will exhibit correct and safe technique with stand>sit into a standard chair or transport chair to reduce risk of falling.   Baseline: pt current technique unsafe, poor motor planning; 6/3: pt with improved technique, bring RW closer to chair prior to sitting; 08/21/2023- Patient able to perform with CGA yet inconsistent with cues for safety and consistency with hand placement.  10/29/23: Pt still with inconsistent STS's, utilizing heavy UE assistance  Goal status: PARTIALLY MET   6. The pt will demonstrate at least a 15 point improvement on SIS-16 to indicate increased ease with ADLs. Baseline: 45 (filled out by pt's mother); 42; 08/21/2023= 46; 09/03/2023: 46/80 10/29/23: TBD Goal status: ONGOING   ASSESSMENT:  CLINICAL IMPRESSION:   Pt responded fairly well to the blazepod activities, noting slightly improved attention span when performing.  Pt still very easily distracted by clinicians walking by to grab supplies and had to be re-directed to the task at hand.  Pt also took and extensive amount of time to perform transfer to the chair from the wall/mirror where pt was performing the tasks.  Pt given significant amount of verbal cuing and remains unsteady at times and sitting too soon before getting  completely situated in front of chair at times.   Pt will continue to benefit from skilled therapy to address remaining deficits in order to improve overall QoL and return to PLOF.         OBJECTIVE IMPAIRMENTS: Abnormal gait, decreased activity tolerance, decreased balance, decreased coordination, decreased endurance, decreased knowledge of use of DME, decreased mobility, difficulty walking, decreased strength, decreased safety awareness, increased edema, impaired tone, improper body mechanics, and postural dysfunction.   ACTIVITY LIMITATIONS: carrying, lifting, bending, standing, squatting, stairs, transfers, bathing, dressing, locomotion level, and caring for others  PARTICIPATION LIMITATIONS: meal prep, cleaning, laundry, medication management, personal finances, driving, shopping, community activity, occupation, and yard work  PERSONAL FACTORS: Sex, Time since onset of injury/illness/exacerbation, and 1-2 comorbidities: per chart PMH includes CAD, HTN, ischemic cardiomyopathy, STEMI, ventricular fibrillation, dysphagia, s/p ICD, apraxia  are also affecting patient's functional outcome.   REHAB POTENTIAL: Fair    CLINICAL DECISION MAKING: Evolving/moderate complexity  EVALUATION COMPLEXITY: High  PLAN:  PT FREQUENCY: 1-2x/week  PT DURATION: 12 weeks  PLANNED INTERVENTIONS: [id number]- PT Re-evaluation, 97750- Physical Performance Testing, 97110-Therapeutic exercises, 97530- Therapeutic activity, 97112- Neuromuscular re-education, 97535- Self Care, [id number]- Manual therapy, [phone]- Gait training, [phone]- Orthotic Initial, [phone]- Orthotic/Prosthetic subsequent, [phone]- Canalith repositioning, Patient/Family education, Balance training, Stair training, Taping, Joint mobilization, Spinal mobilization, Vestibular training, DME instructions, Wheelchair mobility training, Cryotherapy, and Moist heat  PLAN FOR NEXT SESSION:   backwards gait training using RW with skilled assistance - perform in  environment with limited distractions to allow improved motor learning Transfer training - learning sequence of AD management and stepping back to seat Gait training using RW in home-like environment Dynamic balance Reaching with use of Blaze Pods for tapping hands   Fonda Simpers, PT, DPT Physical Therapist - [facility]  11/12/23, 2:04 PM

## 2023-11-14 ENCOUNTER — Encounter

## 2023-11-14 ENCOUNTER — Ambulatory Visit

## 2023-11-14 DIAGNOSIS — R2681 Unsteadiness on feet: Secondary | ICD-10-CM

## 2023-11-14 DIAGNOSIS — R278 Other lack of coordination: Secondary | ICD-10-CM

## 2023-11-14 DIAGNOSIS — R269 Unspecified abnormalities of gait and mobility: Secondary | ICD-10-CM

## 2023-11-14 DIAGNOSIS — M6281 Muscle weakness (generalized): Secondary | ICD-10-CM | POA: Diagnosis not present

## 2023-11-14 DIAGNOSIS — R262 Difficulty in walking, not elsewhere classified: Secondary | ICD-10-CM

## 2023-11-14 DIAGNOSIS — R2689 Other abnormalities of gait and mobility: Secondary | ICD-10-CM

## 2023-11-14 DIAGNOSIS — R41841 Cognitive communication deficit: Secondary | ICD-10-CM

## 2023-11-14 DIAGNOSIS — G931 Anoxic brain damage, not elsewhere classified: Secondary | ICD-10-CM

## 2023-11-14 NOTE — Therapy (Signed)
 OUTPATIENT PHYSICAL THERAPY TREATMENT/RECERT    Patient Name: Beverly [name] MRN: [id number] DOB:08-Jun-1985, 38 y.o., female Today's Date: 11/15/2023  PCP: [facility] REFERRING PROVIDER: Babs Arthea DASEN, MD  END OF SESSION:   PT End of Session - 11/14/23 1408     Visit Number 35    Number of Visits 59    Date for Recertification  02/06/24    Authorization Type UHC Dual Complete; Medicaid secondary    Progress Note Due on Visit 40    PT Start Time 1403    PT Stop Time 1444    PT Time Calculation (min) 41 min    Equipment Utilized During Treatment Gait belt    Activity Tolerance Patient tolerated treatment well    Behavior During Therapy Flat affect;Impulsive;WFL for tasks assessed/performed          Past Medical History:  Diagnosis Date   Brain injury [facility])    CAD (coronary artery disease)    HLD (hyperlipidemia)    Hypertension    last pregnancy   Ischemic cardiomyopathy    STEMI (ST elevation myocardial infarction) (HCC) 09/2019   SCAD with aneurysmal dilation of proximal LAD.   Vaginal Pap smear, abnormal    when she was 38yo   Ventricular fibrillation (HCC) 09/23/2019   Past Surgical History:  Procedure Laterality Date   CARDIAC CATHETERIZATION     IR GASTROSTOMY TUBE MOD SED  10/07/2019   IR GASTROSTOMY TUBE REMOVAL  07/02/2020   IR REPLACE G-TUBE SIMPLE WO FLUORO  09/28/2023   IR REPLC GASTRO/COLONIC TUBE PERCUT W/FLUORO  11/03/2021   IR REPLC GASTRO/COLONIC TUBE PERCUT W/FLUORO  03/09/2023   LEFT HEART CATH AND CORONARY ANGIOGRAPHY N/A 09/23/2019   Procedure: LEFT HEART CATH AND CORONARY ANGIOGRAPHY;  Surgeon: Mady [name], MD;  Location: [facility] INVASIVE CV LAB;  Service: Cardiovascular;  Laterality: N/A;   SUBQ ICD IMPLANT N/A 11/26/2020   Procedure: SUBQ ICD IMPLANT;  Surgeon: [name], MD;  Location: [facility] INVASIVE CV LAB;  Service: Cardiovascular;  Laterality: N/A;   Patient Active Problem List   Diagnosis Date  Noted   Apraxia 10/05/2021   S/P ICD (internal cardiac defibrillator) procedure 11/27/2020   Hx of Cardiac arrest (HCC) 11/26/2020   Abnormality of gait 03/16/2020   Sleep disturbance 03/16/2020   Oropharyngeal dysphagia 02/12/2020   Coronary artery disease involving native coronary artery of native heart without angina pectoris 12/12/2019   Ischemic cardiomyopathy 12/12/2019   Brain injury (HCC)    Prediabetes    Anoxic brain injury (HCC) 11/05/2019   Dysphagia 11/05/2019   Physical deconditioning 11/05/2019   Acute delirium 11/05/2019   Respiratory failure (HCC)    Encounter for central line placement    Ventricular fibrillation (HCC) 09/23/2019   Acute combined systolic and diastolic heart failure (HCC) 09/23/2019   Ventricular tachycardia, polymorphic (HCC) 09/23/2019   Pelvic pain affecting pregnancy 10/15/2015   Supervision of normal pregnancy in third trimester 09/28/2015   Poor weight gain of pregnancy 09/28/2015   Iron  deficiency anemia of pregnancy 09/01/2015   Increased BMI (body mass index) 07/29/2015    ONSET DATE: August 2021  REFERRING DIAG:  G93.1 (ICD-10-CM) - Anoxic brain injury (HCC)  R48.2 (ICD-10-CM) - Apraxia    THERAPY DIAG:   Muscle weakness (generalized) - Plan: PT plan of care cert/re-cert  Other lack of coordination - Plan: PT plan of care cert/re-cert  Anoxic brain injury (HCC) - Plan: PT plan of care cert/re-cert  Difficulty in walking, not  elsewhere classified - Plan: PT plan of care cert/re-cert  Other abnormalities of gait and mobility - Plan: PT plan of care cert/re-cert  Unsteadiness on feet - Plan: PT plan of care cert/re-cert  Abnormality of gait and mobility - Plan: PT plan of care cert/re-cert  Cognitive communication deficit - Plan: PT plan of care cert/re-cert  Rationale for Evaluation and Treatment: Rehabilitation  SUBJECTIVE:                                                                                                                                                                                              SUBJECTIVE STATEMENT:   Patient shook her head when asked if any pain. Father present for today's session and states no new issues to his knowledge.   Pt accompanied by: self and father  PERTINENT HISTORY:   Patient is a 38 year old with diagnois of anoxic brain injury. Pt known to PT clinic. Previously seen for outpatient services March 2024. Pt was receiving HH PT until October 2024 per her mother's report. [name] reports pt using HHA for ambulation, was using RW but she says was told not to do to difficulty with safe technique. Pt has fallen 1x in the past six months trying to ambulate on her own. Pt requires assistance with ADLs, including bathing and dressing. [name] reports pt never indicates any pain. Pt with severe aphasia so is unable to answer questions/pt's mom provides hx. She reports pt has impaired short-term memory, difficulty with stair climbing (has stair lift at home). She reports pt's BLE are weaker than they used to be. The pt is probably limited to <10 min with ambulation due to poor endurance. Pt reports her goal is to be able to use an AD to achieve mod-I mobility rather than relying on someone's HHA to ambulate.She was receiving outpatient PT until Feb. 2022 last year but ran out of visits. Patient has past medical history of Anoxic Brain Injury 09/2019, CAD, HLD, HTN, ISCHEMIC CARDIMYOPATHY, STEMI, V-FIB, ICD on 11/29/2020. Patient is currently living with mom. Mother reports she is abel to walk some with a walker with supervision but primarily ambulates with HHA of mother who is her guardian. Mother also reports she requires assist for all transfers and ADL's.   PAIN:  Are you having pain? No  PRECAUTIONS: Fall  WEIGHT BEARING RESTRICTIONS: No  FALLS: Has patient fallen in last 6 months? Yes. Number of falls 1  LIVING ENVIRONMENT: Lives with: lives with their family,  mother Lives in: House/apartment Stairs: has stairs but stair lift installed, pt using lift Has following equipment at home:  Walker - 2 wheeled, Wheelchair (manual), shower chair, and Grab bars, pt uses stair lift  PLOF: Needs assistance with ADLs  PATIENT GOALS: balance, walking, and pt states basketball  OBJECTIVE:  Note: Objective measures were completed at Evaluation unless otherwise noted.  DIAGNOSTIC FINDINGS:  None recent in chart  COGNITION: Overall cognitive status: impaired   SENSATION: Unable to complete testing due to impaired cognition, pt unable to report if sensation impairment present, pt mother unsure   EDEMA:  Mom reports pt's feet do swell from time to time   LOWER EXTREMITY MMT:  *accuracy of testing likely impacted by pt difficulty following cues  MMT Right Eval Left Eval  Hip flexion 4+ 4+  Hip extension    Hip abduction 4+ 4+  Hip adduction 4+ 4+  Hip internal rotation    Hip external rotation    Knee flexion 4+ 4+  Knee extension 5 5  Ankle dorsiflexion    Ankle plantarflexion    Ankle inversion    Ankle eversion    (Blank rows = not tested)  TRANSFERS: HHA  tried in session, very unsteady, pt has difficulty with motor planning, little success with cues due to distraction, very unsafe Attempted in session with RW, pt unsteady, gait ataxic, continued significant difficulty with motor planning requiring up to min a + 2 and multi-modal cues to safely sit in chair.   FUNCTIONAL TESTS:  5 times sit to stand: 1 min 43 sec Timed up and go (TUG): 1 min 30 sec with RW 10 meter walk test: 0.25 m/s with RW extensive cuing to complete activity  PATIENT SURVEYS:  SIS-16 filled out by pt's mother, used as questions relevant to pt: 45                                                                                                                              TREATMENT DATE: 11/15/2023   Pt arrived to therapy session in transport chair.  Unless  otherwise stated, CGA/min A was provided and gait belt donned in order to ensure pt safety throughout session.  Physical therapy treatment session today consisted of completing assessment of goals and administration of testing as demonstrated and documented in flow sheet, treatment, and goals section of this note. Addition treatments may be found below.    PT instructed pt in TUG: 43 sec with RW with difficulty turning 180 at end and max VC to reach back; 31 sec with HHA today. VC for turning (average of 3 trials; >13.5 sec indicates increased fall risk)   10 Meter Walk Test: Patient instructed to walk 10 meters (32.8 ft) as quickly and as safely as possible at their normal speed x2 and at a fast speed x2. Time measured from 2 meter mark to 8 meter mark to accommodate ramp-up and ramp-down.  Normal speed 1: 0.28 m/s with RW (VC for navigation) Normal speed 2:  0.45 m/s with HHA   Cut off scores: <  0.4 m/s = household Ambulator, 0.4-0.8 m/s = limited community Ambulator, >0.8 m/s = community Ambulator, >1.2 m/s = crossing a street, <1.0 = increased fall risk MCID 0.05 m/s (small), 0.13 m/s (moderate), 0.06 m/s (significant)  (ANPTA Core Set of Outcome Measures for Adults with Neurologic Conditions, 2018)   Five times Sit to Stand Test (FTSS) "Stand up and sit down as quickly as possible 5 times, keeping your arms folded across your chest."    TIME: 32.68 sec with BUE Support (VC for consistency of hand position)   Times > 13.6 seconds is associated with increased disability and morbidity (Guralnik, 2000) Times > 15 seconds is predictive of recurrent falls in healthy individuals aged 72 and older (Buatois, et al., 2008) Normal performance values in community dwelling individuals aged 56 and older (Bohannon, 2006): 60-69 years: 11.4 seconds 70-79 years: 12.6 seconds 80-89 years: 14.8 seconds  MCID: >= 2.3 seconds for Vestibular Disorders (Meretta, 2006)     PATIENT EDUCATION: Education  details: goal reassessment, indications, exercise technique Person educated: Patient and Parent Education method: Explanation, Demonstration, Tactile cues, and Verbal cues Education comprehension: returned demonstration, verbal cues required, tactile cues required, and needs further education   HOME EXERCISE PROGRAM: Access Code: [email or web address] URL: [email or web address] Date: 06/11/2023 Prepared by: Massie Dollar  Exercises - Standing March with Counter Support  - 1 x daily - 7 x weekly - 2 sets - 8 reps - Side Stepping with Counter Support  - 1 x daily - 7 x weekly - 2 sets - 4 reps - Sit to Stand with Armchair  - 1 x daily - 7 x weekly - 2 sets - 5 reps - Seated March  - 1 x daily - 7 x weekly - 3 sets - 10 reps  GOALS: Goals reviewed with patient? Yes   SHORT TERM GOALS: Target date: 07/11/2023  Patient will be independent in home exercise program to improve strength/mobility for better functional independence with ADLs. Baseline: Goal status: INITIAL   LONG TERM GOALS: Target date: 02/06/2024  1.  Patient will complete five times sit to stand test in < 35 seconds indicating an increased LE strength and improved balance. Baseline: previously 37 sec with UUE 04/19/22 today 05/30/23: 1 min 43 seconds; 6/3: 41 sec with improved technique (use of BUE on armrests);  08/21/2023= 38 sec with some distractibility and VC for hand placement 7:23: 39.06 (41.23, 36.89 sec) 10/29/23: 56.85 with HHA; 1 min 16 sec with B UE on armrests 11/14/2023= 32.68 sec with BUE Support (VC for consistency of hand position)  Goal status: PROGRESSING but will keep active to ensure consistency  2. Patient will demo ability to ambulate > 400 ft w/ LRAD versus hand held assist min assist to demonstrate improved household mobility and short community distances.  Baseline: 4/28: 137ft with RW. Mod assist for AD management to prevent veer to the R. 6/3: ~ 300 ft with RW, requires cuing throughout for  proximity to RW particularly with turns, but this has improved compared to eval;  08/21/2023= Patient ambulated approx 300 feet with RW- Sill some min assist at times to navigate walker and VC to remain close to walker.  7/23: 338ft with RW and min assist for safety and moderate instruction for safety AD intermittently, especially with fatigue  10/29/23:  Pt able to ambulate 500' with RW, however required modA at times to keep the walker stable 11/14/2023= Will assess next visit Goal status: IN PROGRESS  3.  Patient will increase 10 meter  walk test to >1.68m/s as to improve gait speed for better community ambulation and to reduce fall risk. Baseline: previously 0.36 m/s on 04/19/22 : 0.25 m/s;  07/10/23: 0. 24 m/s with RW;  08/15/2023= 0.39 m/s  with RW yet max VC for RW navigation.  7/23:Average Normal speed with RW: 0.25 m/s Average Normal speed with HHA: 0.76m/s 10/29/23: Average Normal speed with RW: 0.23 m/s 11/14/2023= 0.28 m/s with RW (VC for navigation); 0.45 m/s with HHA Goal status: PROGRESSING  4.  Patient will reduce timed up and go (TUG) to <50 seconds to reduce fall risk and demonstrate improved transfer/gait ability Baseline: previously 91 sec with RW on 04/19/22; 05/30/23: 1 min 30 sec with RW 07/19/2023: 48.265 seconds using RW with skilled min A primarily for AD management with pt having difficulty turning with AD;  08/21/2023- 33 sec with HHA and 1 min 25 sec with RW (Max VC for safety with turning back to seat and to reach back safely)  7/23:42.18sec with RW and min assist for safety in turn; 40.35 sec with HHA and min assist for safety with turns.  10/29/23: TBD 11/14/2023= 43 sec with RW with difficulty turning 180 at end and max VC to reach back; 31 sec with HHA today. VC for turning.  Goal status: MET  5. Pt will exhibit correct and safe technique with stand>sit into a standard chair or transport chair to reduce risk of falling.   Baseline: pt current technique unsafe, poor motor  planning; 6/3: pt with improved technique, bring RW closer to chair prior to sitting; 08/21/2023- Patient able to perform with CGA yet inconsistent with cues for safety and consistency with hand placement.  10/29/23: Pt still with inconsistent STS's, utilizing heavy UE assistance  11/14/2023= Patient continues to be inconsistent but performed around 10 times today and able to reach back and push up using UE appropriately 7/10 times.  Goal status: PARTIALLY MET   6. The pt will demonstrate at least a 15 point improvement on SIS-16 to indicate increased ease with ADLs. Baseline: 45 (filled out by pt's mother); 42; 08/21/2023= 46; 09/03/2023: 46/80 10/29/23: TBD 11/14/2023- Mother not present so will assess next visit she is present. Goal status: ONGOING   ASSESSMENT:  CLINICAL IMPRESSION:   Patient presents for recert visit today. She responded well to all testing today and father said he was surprised at her effort stating he had not seen her reach back for chair as much as she did today and turning better. She was able to demonstrate some progress with long term goals including gait speed with walker and with HHA. She also improved with TUG score and met her goal. Will keep other goals activities to ensure patient remains consistent. Patient's condition has the potential to improve in response to therapy. Maximum improvement is yet to be obtained. The anticipated improvement is attainable and reasonable in a generally predictable time. Pt will continue to benefit from skilled therapy to address remaining deficits in order to improve overall QoL and return to PLOF.         OBJECTIVE IMPAIRMENTS: Abnormal gait, decreased activity tolerance, decreased balance, decreased coordination, decreased endurance, decreased knowledge of use of DME, decreased mobility, difficulty walking, decreased strength, decreased safety awareness, increased edema, impaired tone, improper body mechanics, and postural  dysfunction.   ACTIVITY LIMITATIONS: carrying, lifting, bending, standing, squatting, stairs, transfers, bathing, dressing, locomotion level, and caring for others  PARTICIPATION LIMITATIONS: meal prep, cleaning, laundry, medication management, personal finances, driving, shopping, community  activity, occupation, and yard work  PERSONAL FACTORS: Sex, Time since onset of injury/illness/exacerbation, and 1-2 comorbidities: per chart PMH includes CAD, HTN, ischemic cardiomyopathy, STEMI, ventricular fibrillation, dysphagia, s/p ICD, apraxia  are also affecting patient's functional outcome.   REHAB POTENTIAL: Fair    CLINICAL DECISION MAKING: Evolving/moderate complexity  EVALUATION COMPLEXITY: High  PLAN:  PT FREQUENCY: 1-2x/week  PT DURATION: 12 weeks  PLANNED INTERVENTIONS: 97164- PT Re-evaluation, 97750- Physical Performance Testing, 97110-Therapeutic exercises, 97530- Therapeutic activity, 97112- Neuromuscular re-education, 97535- Self Care, [id number]- Manual therapy, [phone]- Gait training, [phone]- Orthotic Initial, [phone]- Orthotic/Prosthetic subsequent, [phone]- Canalith repositioning, Patient/Family education, Balance training, Stair training, Taping, Joint mobilization, Spinal mobilization, Vestibular training, DME instructions, Wheelchair mobility training, Cryotherapy, and Moist heat  PLAN FOR NEXT SESSION:   backwards gait training using RW with skilled assistance - perform in environment with limited distractions to allow improved motor learning Transfer training - learning sequence of AD management and stepping back to seat Gait training using RW in home-like environment Dynamic balance Reaching with use of Blaze Pods for tapping hands   Chyrl London, PT Physical Therapist - [facility]  [facility]  11/15/23, 1:32 PM

## 2023-11-15 ENCOUNTER — Encounter: Payer: Self-pay | Admitting: Nurse Practitioner

## 2023-11-15 ENCOUNTER — Ambulatory Visit: Admitting: Nurse Practitioner

## 2023-11-15 VITALS — BP 98/66 | HR 73 | Temp 97.5°F | Ht 69.0 in | Wt 200.0 lb

## 2023-11-15 DIAGNOSIS — I469 Cardiac arrest, cause unspecified: Secondary | ICD-10-CM | POA: Diagnosis not present

## 2023-11-15 DIAGNOSIS — R7303 Prediabetes: Secondary | ICD-10-CM | POA: Diagnosis not present

## 2023-11-15 DIAGNOSIS — G931 Anoxic brain damage, not elsewhere classified: Secondary | ICD-10-CM

## 2023-11-15 DIAGNOSIS — Z8674 Personal history of sudden cardiac arrest: Secondary | ICD-10-CM | POA: Diagnosis not present

## 2023-11-15 DIAGNOSIS — N3944 Nocturnal enuresis: Secondary | ICD-10-CM

## 2023-11-15 DIAGNOSIS — R269 Unspecified abnormalities of gait and mobility: Secondary | ICD-10-CM

## 2023-11-15 DIAGNOSIS — R1310 Dysphagia, unspecified: Secondary | ICD-10-CM

## 2023-11-15 DIAGNOSIS — E785 Hyperlipidemia, unspecified: Secondary | ICD-10-CM

## 2023-11-15 NOTE — Progress Notes (Addendum)
 New Patient Office Visit  Subjective   Patient ID: Beverly [name], female    DOB: Mar 03, 1985  Age: 38 y.o. MRN: [id number]  CC:  Chief Complaint  Patient presents with   Establish Care     HPI Beverly [name] presents to establish care accompanied by her mother. Her previous PCP was Dr. Grayce devonshire at [facility] center.   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   She had prolonged hospital course from 09/24/2019 until 11/28/2019.  She is currently living with her parents and 3 kids. She has significant cognitive and physical impairments. She suffers from short-term and some long-term memory loss and requires total care.   A PEG tube was placed in 2021 for nutrition. She still has feeding tube   due to occasional swallowing difficulties. She has a defibrillator and attends cardiology follow-ups every six months. Current medications include carbidopa -levodopa , carvedilol , aspirin , trazodone , and triamcinolone cream for eczema. Previous trials of Ritalin  and Adderall were not well tolerated.  She undergoes rehabilitation services, including physical, occupational, and speech therapy, to improve mobility and cognitive function. She can speak infrequently with a changed voice and walks with one-handed assistance but remains unsteady and requires supervision. She is easily distracted and loses focus, affecting her ability to perform daily activities independently.   She also seems to have quite a bit of saliva and frequently needs to spit this out.    Family history includes diabetes in both grandmothers and heart issues in her paternal grandmother. Her father has high cholesterol, and she was prediabetic during her last pregnancy. She has three children, aged twelve, eight, and four, but often does  not remember them due to her memory issues.  She has incontinence at night, during the day time she has no incontinence episodes during day. Has been using purewick at night.   Health Maintenance  Topic Date Due   Medicare Annual Wellness (AWV)  Never done   Hepatitis B Vaccines 19-59 Average Risk (3 of 3 - 3-dose series) 06/21/2005   Hepatitis C Screening  Never done   Pneumococcal Vaccine (1 of 2 - PCV) Never done   HPV VACCINES (1 - 3-dose SCDM series) Never done   Cervical Cancer Screening (HPV/Pap Cotest)  04/22/2018   COVID-19 Vaccine (1 - 2025-26 season) Never done   Influenza Vaccine  05/06/2024 (Originally 09/07/2023)   DTaP/Tdap/Td (2 - Td or Tdap) 07/28/2025   HIV Screening  Completed   Meningococcal B Vaccine  Aged Out       Topic Date Due   Hepatitis B Vaccines 19-59 Average Risk (3 of 3 - 3-dose series) 06/21/2005   HPV VACCINES (1 - 3-dose SCDM series) Never done    Outpatient Encounter Medications as of 11/15/2023  Medication Sig   atorvastatin  (LIPITOR ) 40 MG tablet TAKE 1 TABLET BY MOUTH EVERY DAY   carbidopa -levodopa  (SINEMET  IR) 25-100 MG tablet TAKE 1 TABLET BY MOUTH THREE TIMES A DAY. NEED PRIMARY INSURANCE   carvedilol  (COREG ) 3.125 MG tablet TAKE 1 TABLET BY MOUTH 2 TIMES DAILY.   clobetasol (TEMOVATE) 0.05 % external solution Apply topically 2 (two) times daily.   [facility] ASPIRIN  ADULT LOW DOSE 81 MG chewable tablet PLACE 1 TABLET (81 MG TOTAL) INTO FEEDING TUBE DAILY.   Ensure Plus (ENSURE PLUS) LIQD Take by mouth. Drinks 5-6 cans  per day   Melatonin 5 MG CAPS Place 5 mg into feeding tube at bedtime.   Multiple Vitamin (MULTIVITAMIN WITH MINERALS) TABS tablet Place 1 tablet into feeding tube daily.   scopolamine  (TRANSDERM-SCOP) 1 MG/3DAYS 1 patch every 3 (three) days.   traZODone  (DESYREL ) 100 MG tablet PLACE 1 TABLET (100 MG TOTAL) INTO FEEDING TUBE AT BEDTIME.   triamcinolone cream (KENALOG) 0.1 % SMARTSIG:Sparingly Topical Twice Daily   Water  For  Irrigation, Sterile (FREE WATER ) SOLN Place 200 mLs into feeding tube every 6 (six) hours.   No facility-administered encounter medications on file as of 11/15/2023.    Past Medical History:  Diagnosis Date   Brain injury [facility])    CAD (coronary artery disease)    HLD (hyperlipidemia)    Hypertension    last pregnancy   Ischemic cardiomyopathy    STEMI (ST elevation myocardial infarction) (HCC) 09/2019   SCAD with aneurysmal dilation of proximal LAD.   Vaginal Pap smear, abnormal    when she was 38yo   Ventricular fibrillation (HCC) 09/23/2019    Past Surgical History:  Procedure Laterality Date   CARDIAC CATHETERIZATION     IR GASTROSTOMY TUBE MOD SED  10/07/2019   IR GASTROSTOMY TUBE REMOVAL  07/02/2020   IR REPLACE G-TUBE SIMPLE WO FLUORO  09/28/2023   IR REPLC GASTRO/COLONIC TUBE PERCUT W/FLUORO  11/03/2021   IR REPLC GASTRO/COLONIC TUBE PERCUT W/FLUORO  03/09/2023   LEFT HEART CATH AND CORONARY ANGIOGRAPHY N/A 09/23/2019   Procedure: LEFT HEART CATH AND CORONARY ANGIOGRAPHY;  Surgeon: Mady [name], MD;  Location: [facility] INVASIVE CV LAB;  Service: Cardiovascular;  Laterality: N/A;   SUBQ ICD IMPLANT N/A 11/26/2020   Procedure: SUBQ ICD IMPLANT;  Surgeon: [name], MD;  Location: [facility] INVASIVE CV LAB;  Service: Cardiovascular;  Laterality: N/A;    Family History  Problem Relation Age of Onset   Hyperlipidemia Mother    Hypertension Mother    Diabetes Maternal Grandmother    Seizures Maternal Grandmother    Thyroid  disease Maternal Grandmother    Diabetes Paternal Grandmother    Rashes / Skin problems Son        ezcema   Seizures Son     Social History   Socioeconomic History   Marital status: Single    Spouse name: Not on file   Number of children: 3   Years of education: Not on file   Highest education level: Some college, no degree  Occupational History   Occupation: oncology    Employer: [employer]    Comment: NA  Tobacco Use   Smoking status:  Never   Smokeless tobacco: Never  Vaping Use   Vaping status: Never Used  Substance and Sexual Activity   Alcohol use: Not Currently    Alcohol/week: 0.0 standard drinks of alcohol    Comment: quit 2017   Drug use: No    Frequency: 5.0 times per week    Types: Marijuana    Comment: stopped when she found out she was pregnant   Sexual activity: Not Currently    Partners: Male    Birth control/protection: None  Other Topics Concern   Not on file  Social History Narrative   01/06/20 lives with mom and her children   Social Drivers of Health   Financial Resource Strain: Low Risk  (11/12/2023)   Overall Financial Resource Strain (CARDIA)    Difficulty of Paying Living Expenses: Not hard at all  Food Insecurity: No Food Insecurity (11/12/2023)  Hunger Vital Sign    Worried About Running Out of Food in the Last Year: Never true    Ran Out of Food in the Last Year: Never true  Transportation Needs: No Transportation Needs (11/12/2023)   PRAPARE - Administrator, Civil Service (Medical): No    Lack of Transportation (Non-Medical): No  Physical Activity: Insufficiently Active (11/12/2023)   Exercise Vital Sign    Days of Exercise per Week: 2 days    Minutes of Exercise per Session: 30 min  Stress: No Stress Concern Present (11/12/2023)   [employer] of Occupational Health - Occupational Stress Questionnaire    Feeling of Stress: Not at all  Social Connections: Socially Isolated (11/12/2023)   Social Connection and Isolation Panel    Frequency of Communication with Friends and Family: Never    Frequency of Social Gatherings with Friends and Family: Once a week    Attends Religious Services: Patient declined    Database Administrator or Organizations: No    Attends Engineer, Structural: Not on file    Marital Status: Never married  Intimate Partner Violence: Not on file    ROS Negative unless indicated in HPI.      Objective    BP 98/66   Pulse 73    Temp (!) 97.5 F (36.4 C)   Ht 5' 9 (1.753 m)   Wt 200 lb (90.7 kg)   LMP 10/27/2023   SpO2 98%   BMI 29.53 kg/m   Physical Exam Constitutional:      Appearance: Normal appearance.  HENT:     Right Ear: Tympanic membrane normal.     Left Ear: Tympanic membrane normal.     Mouth/Throat:     Mouth: Mucous membranes are moist.  Eyes:     Conjunctiva/sclera: Conjunctivae normal.     Pupils: Pupils are equal, round, and reactive to light.  Cardiovascular:     Rate and Rhythm: Normal rate and regular rhythm.     Pulses: Normal pulses.     Heart sounds: Normal heart sounds.  Pulmonary:     Effort: Pulmonary effort is normal.     Breath sounds: Normal breath sounds.  Musculoskeletal:     Cervical back: Normal range of motion. No tenderness.  Skin:    General: Skin is warm.     Findings: No bruising.  Neurological:     Mental Status: She is alert. Mental status is at baseline.     Gait: Gait abnormal.  Psychiatric:        Mood and Affect: Mood normal.        Behavior: Behavior normal.         Assessment & Plan:  Prediabetes -     Hemoglobin A1c  Hyperlipidemia, unspecified hyperlipidemia type Assessment & Plan: On cholesterol medication for cardiovascular risk management. - Continue cholesterol medication.  Orders: -     CBC with Differential/Platelet -     Comprehensive metabolic panel with GFR -     Lipid panel -     TSH  Anoxic brain injury [facility]) Assessment & Plan: Sustained an anoxic brain injury following cardiac arrest in August 2021, resulting in significant cognitive and physical impairments. Undergoing therapies to improve functional abilities. - Continue physical, occupational, and speech therapy.   Hx of Cardiac arrest [facility]) Assessment & Plan: Experienced cardiac arrest in August 2021, post-partum, with subsequent implantation of a defibrillator. Under regular cardiology follow-up. - Continue follow-up with cardiology every six  months.   Dysphagia, unspecified type Assessment & Plan: Dependent on PEG tube for nutrition since 2021 due to dysphagia. Requires regular PEG tube changes. - Refer to GI specialist for PEG tube management and regular changes.  Orders: -     Ambulatory referral to Gastroenterology  Abnormality of gait Assessment & Plan: She undergoes rehabilitation services, including physical, occupational, and speech therapy, to improve mobility and cognitive function. -Followed by Dr. Arlana.   Nocturnal enuresis Assessment & Plan: Mother reports pt has incontinence at night, during the day time she has no incontinence episodes. Has been using purewick at night.      No follow-ups on file.   Avyana Puffenbarger, NP

## 2023-11-16 LAB — COMPREHENSIVE METABOLIC PANEL WITH GFR
ALT: 16 U/L (ref 0–35)
AST: 23 U/L (ref 0–37)
Albumin: 4.1 g/dL (ref 3.5–5.2)
Alkaline Phosphatase: 91 U/L (ref 39–117)
BUN: 12 mg/dL (ref 6–23)
CO2: 29 meq/L (ref 19–32)
Calcium: 9.3 mg/dL (ref 8.4–10.5)
Chloride: 99 meq/L (ref 96–112)
Creatinine, Ser: 0.57 mg/dL (ref 0.40–1.20)
GFR: 116.97 mL/min (ref >60.00)
Glucose, Bld: 92 mg/dL (ref 70–99)
Potassium: 4.7 meq/L (ref 3.5–5.1)
Sodium: 134 meq/L — ABNORMAL LOW (ref 135–145)
Total Bilirubin: 0.4 mg/dL (ref 0.2–1.2)
Total Protein: 7.8 g/dL (ref 6.0–8.3)

## 2023-11-16 LAB — HEMOGLOBIN A1C: Hgb A1c MFr Bld: 6 % (ref 4.6–6.5)

## 2023-11-16 LAB — CBC WITH DIFFERENTIAL/PLATELET
Basophils Absolute: 0 K/uL (ref 0.0–0.1)
Basophils Relative: 0.5 % (ref 0.0–3.0)
Eosinophils Absolute: 0.1 K/uL (ref 0.0–0.7)
Eosinophils Relative: 1.5 % (ref 0.0–5.0)
HCT: 38.2 % (ref 36.0–46.0)
Hemoglobin: 12.4 g/dL (ref 12.0–15.0)
Lymphocytes Relative: 38.4 % (ref 12.0–46.0)
Lymphs Abs: 2.4 K/uL (ref 0.7–4.0)
MCHC: 32.6 g/dL (ref 30.0–36.0)
MCV: 87 fl (ref 78.0–100.0)
Monocytes Absolute: 0.5 K/uL (ref 0.1–1.0)
Monocytes Relative: 8.2 % (ref 3.0–12.0)
Neutro Abs: 3.2 K/uL (ref 1.4–7.7)
Neutrophils Relative %: 51.4 % (ref 43.0–77.0)
Platelets: 348 K/uL (ref 150.0–400.0)
RBC: 4.39 Mil/uL (ref 3.87–5.11)
RDW: 16.2 % — ABNORMAL HIGH (ref 11.5–15.5)
WBC: 6.1 K/uL (ref 4.0–10.5)

## 2023-11-16 LAB — LIPID PANEL
Cholesterol: 103 mg/dL (ref 0–200)
HDL: 40.6 mg/dL (ref >39.00)
LDL Cholesterol: 46 mg/dL (ref 0–99)
NonHDL: 62.58
Total CHOL/HDL Ratio: 3
Triglycerides: 81 mg/dL (ref 0.0–149.0)
VLDL: 16.2 mg/dL (ref 0.0–40.0)

## 2023-11-16 LAB — TSH: TSH: 1.03 u[IU]/mL (ref 0.35–5.50)

## 2023-11-19 ENCOUNTER — Ambulatory Visit

## 2023-11-19 DIAGNOSIS — G931 Anoxic brain damage, not elsewhere classified: Secondary | ICD-10-CM

## 2023-11-19 DIAGNOSIS — R262 Difficulty in walking, not elsewhere classified: Secondary | ICD-10-CM

## 2023-11-19 DIAGNOSIS — M6281 Muscle weakness (generalized): Secondary | ICD-10-CM

## 2023-11-19 DIAGNOSIS — R2689 Other abnormalities of gait and mobility: Secondary | ICD-10-CM

## 2023-11-19 DIAGNOSIS — R278 Other lack of coordination: Secondary | ICD-10-CM

## 2023-11-19 DIAGNOSIS — R41841 Cognitive communication deficit: Secondary | ICD-10-CM

## 2023-11-19 DIAGNOSIS — R269 Unspecified abnormalities of gait and mobility: Secondary | ICD-10-CM

## 2023-11-19 DIAGNOSIS — R2681 Unsteadiness on feet: Secondary | ICD-10-CM

## 2023-11-19 NOTE — Therapy (Signed)
 OUTPATIENT PHYSICAL THERAPY TREATMENT    Patient Name: Beverly [name] MRN: [id number] DOB:Jun 20, 1985, 38 y.o., female Today's Date: 11/19/2023  PCP: [facility] REFERRING PROVIDER: Babs Arthea DASEN, MD  END OF SESSION:   PT End of Session - 11/19/23 1400     Visit Number 36    Number of Visits 59    Date for Recertification  02/06/24    Authorization Type UHC Dual Complete; Medicaid secondary    Progress Note Due on Visit 40    PT Start Time 1358    PT Stop Time 1440    PT Time Calculation (min) 42 min    Equipment Utilized During Treatment Gait belt    Activity Tolerance Patient tolerated treatment well    Behavior During Therapy Flat affect;Impulsive;WFL for tasks assessed/performed         Past Medical History:  Diagnosis Date   Brain injury [facility])    CAD (coronary artery disease)    HLD (hyperlipidemia)    Hypertension    last pregnancy   Ischemic cardiomyopathy    STEMI (ST elevation myocardial infarction) (HCC) 09/2019   SCAD with aneurysmal dilation of proximal LAD.   Vaginal Pap smear, abnormal    when she was 38yo   Ventricular fibrillation (HCC) 09/23/2019   Past Surgical History:  Procedure Laterality Date   CARDIAC CATHETERIZATION     IR GASTROSTOMY TUBE MOD SED  10/07/2019   IR GASTROSTOMY TUBE REMOVAL  07/02/2020   IR REPLACE G-TUBE SIMPLE WO FLUORO  09/28/2023   IR REPLC GASTRO/COLONIC TUBE PERCUT W/FLUORO  11/03/2021   IR REPLC GASTRO/COLONIC TUBE PERCUT W/FLUORO  03/09/2023   LEFT HEART CATH AND CORONARY ANGIOGRAPHY N/A 09/23/2019   Procedure: LEFT HEART CATH AND CORONARY ANGIOGRAPHY;  Surgeon: Mady [name], MD;  Location: [facility] INVASIVE CV LAB;  Service: Cardiovascular;  Laterality: N/A;   SUBQ ICD IMPLANT N/A 11/26/2020   Procedure: SUBQ ICD IMPLANT;  Surgeon: [name], MD;  Location: [facility] INVASIVE CV LAB;  Service: Cardiovascular;  Laterality: N/A;   Patient Active Problem List   Diagnosis Date Noted    Apraxia 10/05/2021   S/P ICD (internal cardiac defibrillator) procedure 11/27/2020   Hx of Cardiac arrest (HCC) 11/26/2020   Abnormality of gait 03/16/2020   Sleep disturbance 03/16/2020   Oropharyngeal dysphagia 02/12/2020   Coronary artery disease involving native coronary artery of native heart without angina pectoris 12/12/2019   Ischemic cardiomyopathy 12/12/2019   Brain injury (HCC)    Prediabetes    Anoxic brain injury (HCC) 11/05/2019   Dysphagia 11/05/2019   Physical deconditioning 11/05/2019   Acute delirium 11/05/2019   Respiratory failure (HCC)    Encounter for central line placement    Ventricular fibrillation (HCC) 09/23/2019   Acute combined systolic and diastolic heart failure (HCC) 09/23/2019   Ventricular tachycardia, polymorphic (HCC) 09/23/2019   Pelvic pain affecting pregnancy 10/15/2015   Supervision of normal pregnancy in third trimester 09/28/2015   Poor weight gain of pregnancy 09/28/2015   Iron  deficiency anemia of pregnancy 09/01/2015   Increased BMI (body mass index) 07/29/2015    ONSET DATE: August 2021  REFERRING DIAG:  G93.1 (ICD-10-CM) - Anoxic brain injury (HCC)  R48.2 (ICD-10-CM) - Apraxia    THERAPY DIAG:   Muscle weakness (generalized)  Other lack of coordination  Anoxic brain injury (HCC)  Difficulty in walking, not elsewhere classified  Other abnormalities of gait and mobility  Unsteadiness on feet  Abnormality of gait and mobility  Cognitive communication  deficit  Rationale for Evaluation and Treatment: Rehabilitation  SUBJECTIVE:                                                                                                                                                                                             SUBJECTIVE STATEMENT:   Pt denies any pain upon arrival to the clinic.  Pt denies any other complaints at this time.   Pt accompanied by: self and father  PERTINENT HISTORY:   Patient is a 38 year old  with diagnois of anoxic brain injury. Pt known to PT clinic. Previously seen for outpatient services March 2024. Pt was receiving HH PT until October 2024 per her mother's report. [name] reports pt using HHA for ambulation, was using RW but she says was told not to do to difficulty with safe technique. Pt has fallen 1x in the past six months trying to ambulate on her own. Pt requires assistance with ADLs, including bathing and dressing. [name] reports pt never indicates any pain. Pt with severe aphasia so is unable to answer questions/pt's mom provides hx. She reports pt has impaired short-term memory, difficulty with stair climbing (has stair lift at home). She reports pt's BLE are weaker than they used to be. The pt is probably limited to <10 min with ambulation due to poor endurance. Pt reports her goal is to be able to use an AD to achieve mod-I mobility rather than relying on someone's HHA to ambulate.She was receiving outpatient PT until Feb. 2022 last year but ran out of visits. Patient has past medical history of Anoxic Brain Injury 09/2019, CAD, HLD, HTN, ISCHEMIC CARDIMYOPATHY, STEMI, V-FIB, ICD on 11/29/2020. Patient is currently living with mom. Mother reports she is abel to walk some with a walker with supervision but primarily ambulates with HHA of mother who is her guardian. Mother also reports she requires assist for all transfers and ADL's.   PAIN:  Are you having pain? No  PRECAUTIONS: Fall  WEIGHT BEARING RESTRICTIONS: No  FALLS: Has patient fallen in last 6 months? Yes. Number of falls 1  LIVING ENVIRONMENT: Lives with: lives with their family, mother Lives in: House/apartment Stairs: has stairs but stair lift installed, pt using lift Has following equipment at home: Environmental consultant - 2 wheeled, Wheelchair (manual), shower chair, and Grab bars, pt uses stair lift  PLOF: Needs assistance with ADLs  PATIENT GOALS: balance, walking, and pt states basketball  OBJECTIVE:  Note:  Objective measures were completed at Evaluation unless otherwise noted.  DIAGNOSTIC FINDINGS:  None recent in chart  COGNITION: Overall cognitive status: impaired   SENSATION: Unable  to complete testing due to impaired cognition, pt unable to report if sensation impairment present, pt mother unsure   EDEMA:  Mom reports pt's feet do swell from time to time   LOWER EXTREMITY MMT:  *accuracy of testing likely impacted by pt difficulty following cues  MMT Right Eval Left Eval  Hip flexion 4+ 4+  Hip extension    Hip abduction 4+ 4+  Hip adduction 4+ 4+  Hip internal rotation    Hip external rotation    Knee flexion 4+ 4+  Knee extension 5 5  Ankle dorsiflexion    Ankle plantarflexion    Ankle inversion    Ankle eversion    (Blank rows = not tested)  TRANSFERS: HHA  tried in session, very unsteady, pt has difficulty with motor planning, little success with cues due to distraction, very unsafe Attempted in session with RW, pt unsteady, gait ataxic, continued significant difficulty with motor planning requiring up to min a + 2 and multi-modal cues to safely sit in chair.   FUNCTIONAL TESTS:  5 times sit to stand: 1 min 43 sec Timed up and go (TUG): 1 min 30 sec with RW 10 meter walk test: 0.25 m/s with RW extensive cuing to complete activity  PATIENT SURVEYS:  SIS-16 filled out by pt's mother, used as questions relevant to pt: 45                                                                                                                              TREATMENT DATE: 11/19/2023   Pt arrived to therapy session in transport chair.  Unless otherwise stated, CGA/min A was provided and gait belt donned in order to ensure pt safety throughout session.  Ambulation with use of towels placed underneath the underarms for tactile feedback to promote close proximity of the walker, however pt unable to keep the towels under the arms when ambulating after multiple attempts.  Towels  then became a distraction and pt attempted to pick up but was ultimately unable to grasp even when bent over.    Ambulation attempt with theraband around the hips and tied to the walker for tactile feedback, and pt was much more better with ambulation and stayed closer/within the walker when ambulating.  Standing squats to pick up towels from floor, significant amount of time spent attempting, with the pt unable to formally do so and squatting too low to rise back without modA from therapist  Standing mini-squats to pick up basketball from elevated (18) surface, x5      PATIENT EDUCATION: Education details: goal reassessment, indications, exercise technique Person educated: Patient and Parent Education method: Explanation, Demonstration, Tactile cues, and Verbal cues Education comprehension: returned demonstration, verbal cues required, tactile cues required, and needs further education   HOME EXERCISE PROGRAM: Access Code: [email or web address] URL: [email or web address] Date: 06/11/2023 Prepared by: Massie Dollar  Exercises - Standing March with Counter Support  - 1 x daily - 7 x  weekly - 2 sets - 8 reps - Side Stepping with Counter Support  - 1 x daily - 7 x weekly - 2 sets - 4 reps - Sit to Stand with Armchair  - 1 x daily - 7 x weekly - 2 sets - 5 reps - Seated March  - 1 x daily - 7 x weekly - 3 sets - 10 reps  GOALS: Goals reviewed with patient? Yes   SHORT TERM GOALS: Target date: 07/11/2023  Patient will be independent in home exercise program to improve strength/mobility for better functional independence with ADLs. Baseline: Goal status: INITIAL   LONG TERM GOALS: Target date: 02/06/2024  1.  Patient will complete five times sit to stand test in < 35 seconds indicating an increased LE strength and improved balance. Baseline: previously 37 sec with UUE 04/19/22 today 05/30/23: 1 min 43 seconds; 6/3: 41 sec with improved technique (use of BUE on armrests);   08/21/2023= 38 sec with some distractibility and VC for hand placement 7:23: 39.06 (41.23, 36.89 sec) 10/29/23: 56.85 with HHA; 1 min 16 sec with B UE on armrests 11/14/2023= 32.68 sec with BUE Support (VC for consistency of hand position)  Goal status: PROGRESSING but will keep active to ensure consistency  2. Patient will demo ability to ambulate > 400 ft w/ LRAD versus hand held assist min assist to demonstrate improved household mobility and short community distances.  Baseline: 4/28: 176ft with RW. Mod assist for AD management to prevent veer to the R. 6/3: ~ 300 ft with RW, requires cuing throughout for proximity to RW particularly with turns, but this has improved compared to eval;  08/21/2023= Patient ambulated approx 300 feet with RW- Sill some min assist at times to navigate walker and VC to remain close to walker.  7/23: 310ft with RW and min assist for safety and moderate instruction for safety AD intermittently, especially with fatigue  10/29/23:  Pt able to ambulate 500' with RW, however required modA at times to keep the walker stable 11/14/2023= Will assess next visit Goal status: IN PROGRESS  3.  Patient will increase 10 meter walk test to >1.32m/s as to improve gait speed for better community ambulation and to reduce fall risk. Baseline: previously 0.36 m/s on 04/19/22 : 0.25 m/s;  07/10/23: 0. 24 m/s with RW;  08/15/2023= 0.39 m/s  with RW yet max VC for RW navigation.  7/23:Average Normal speed with RW: 0.25 m/s Average Normal speed with HHA: 0.69m/s 10/29/23: Average Normal speed with RW: 0.23 m/s 11/14/2023= 0.28 m/s with RW (VC for navigation); 0.45 m/s with HHA Goal status: PROGRESSING  4.  Patient will reduce timed up and go (TUG) to <50 seconds to reduce fall risk and demonstrate improved transfer/gait ability Baseline: previously 91 sec with RW on 04/19/22; 05/30/23: 1 min 30 sec with RW 07/19/2023: 48.265 seconds using RW with skilled min A primarily for AD management with pt  having difficulty turning with AD;  08/21/2023- 33 sec with HHA and 1 min 25 sec with RW (Max VC for safety with turning back to seat and to reach back safely)  7/23:42.18sec with RW and min assist for safety in turn; 40.35 sec with HHA and min assist for safety with turns.  10/29/23: TBD 11/14/2023= 43 sec with RW with difficulty turning 180 at end and max VC to reach back; 31 sec with HHA today. VC for turning.  Goal status: MET  5. Pt will exhibit correct and safe technique with stand>sit into  a standard chair or transport chair to reduce risk of falling.   Baseline: pt current technique unsafe, poor motor planning; 6/3: pt with improved technique, bring RW closer to chair prior to sitting; 08/21/2023- Patient able to perform with CGA yet inconsistent with cues for safety and consistency with hand placement.  10/29/23: Pt still with inconsistent STS's, utilizing heavy UE assistance  11/14/2023= Patient continues to be inconsistent but performed around 10 times today and able to reach back and push up using UE appropriately 7/10 times.  Goal status: PARTIALLY MET   6. The pt will demonstrate at least a 15 point improvement on SIS-16 to indicate increased ease with ADLs. Baseline: 45 (filled out by pt's mother); 42; 08/21/2023= 46; 09/03/2023: 46/80 10/29/23: TBD 11/19/2023- Mother not present so will assess next visit she is present. Goal status: ONGOING   ASSESSMENT:  CLINICAL IMPRESSION:   Pt participated in several different ambulation attempts with differing techniques.  Pt was unable to perform the exercise with the towel rolls under the arm due to it becoming a distraction when ambulating and the tactile feedback not assisting with her awareness of keeping the arms close by.  Pt did however demonstrate improved gait mechanics with the theraband tied behind her to the walker, which have tactile feedback and resistance when the pt pushed the walker too far in front, or left out front when trying  to step backwards.  Will likely attempt to perform this technique in the future in order to keep the walker close by and improve overall mechanics during ambulation.   Pt will continue to benefit from skilled therapy to address remaining deficits in order to improve overall QoL and return to PLOF.          OBJECTIVE IMPAIRMENTS: Abnormal gait, decreased activity tolerance, decreased balance, decreased coordination, decreased endurance, decreased knowledge of use of DME, decreased mobility, difficulty walking, decreased strength, decreased safety awareness, increased edema, impaired tone, improper body mechanics, and postural dysfunction.   ACTIVITY LIMITATIONS: carrying, lifting, bending, standing, squatting, stairs, transfers, bathing, dressing, locomotion level, and caring for others  PARTICIPATION LIMITATIONS: meal prep, cleaning, laundry, medication management, personal finances, driving, shopping, community activity, occupation, and yard work  PERSONAL FACTORS: Sex, Time since onset of injury/illness/exacerbation, and 1-2 comorbidities: per chart PMH includes CAD, HTN, ischemic cardiomyopathy, STEMI, ventricular fibrillation, dysphagia, s/p ICD, apraxia  are also affecting patient's functional outcome.   REHAB POTENTIAL: Fair    CLINICAL DECISION MAKING: Evolving/moderate complexity  EVALUATION COMPLEXITY: High  PLAN:  PT FREQUENCY: 1-2x/week  PT DURATION: 12 weeks  PLANNED INTERVENTIONS: [id number]- PT Re-evaluation, 97750- Physical Performance Testing, 97110-Therapeutic exercises, 97530- Therapeutic activity, 97112- Neuromuscular re-education, 97535- Self Care, [id number]- Manual therapy, [phone]- Gait training, [phone]- Orthotic Initial, [phone]- Orthotic/Prosthetic subsequent, [phone]- Canalith repositioning, Patient/Family education, Balance training, Stair training, Taping, Joint mobilization, Spinal mobilization, Vestibular training, DME instructions, Wheelchair mobility training, Cryotherapy,  and Moist heat  PLAN FOR NEXT SESSION:   backwards gait training using RW with skilled assistance - perform in environment with limited distractions to allow improved motor learning Transfer training - learning sequence of AD management and stepping back to seat Gait training using RW in home-like environment Dynamic balance Reaching with use of Blaze Pods for tapping hands   Fonda Simpers, PT, DPT Physical Therapist - [facility]  11/19/23, 5:48 PM

## 2023-11-19 NOTE — Therapy (Signed)
 Occupational Therapy Outpatient Neuro Treatment Note  Patient Name: Beverly [name] MRN: [id number] DOB:February 13, 1985, 38 y.o., female Today's Date: 11/19/2023  REFERRING PROVIDER: Babs Hussar, MD  END OF SESSION:  OT End of Session - 11/19/23 1400     Visit Number 14    Number of Visits 22    Date for Recertification  01/07/24    OT Start Time 1315    OT Stop Time 1400    OT Time Calculation (min) 45 min    Equipment Utilized During Treatment transport chair    Activity Tolerance Patient tolerated treatment well    Behavior During Therapy Flat affect;Impulsive;WFL for tasks assessed/performed         Past Medical History:  Diagnosis Date   Brain injury [facility])    CAD (coronary artery disease)    HLD (hyperlipidemia)    Hypertension    last pregnancy   Ischemic cardiomyopathy    STEMI (ST elevation myocardial infarction) (HCC) 09/2019   SCAD with aneurysmal dilation of proximal LAD.   Vaginal Pap smear, abnormal    when she was 38yo   Ventricular fibrillation (HCC) 09/23/2019   Past Surgical History:  Procedure Laterality Date   CARDIAC CATHETERIZATION     IR GASTROSTOMY TUBE MOD SED  10/07/2019   IR GASTROSTOMY TUBE REMOVAL  07/02/2020   IR REPLACE G-TUBE SIMPLE WO FLUORO  09/28/2023   IR REPLC GASTRO/COLONIC TUBE PERCUT W/FLUORO  11/03/2021   IR REPLC GASTRO/COLONIC TUBE PERCUT W/FLUORO  03/09/2023   LEFT HEART CATH AND CORONARY ANGIOGRAPHY N/A 09/23/2019   Procedure: LEFT HEART CATH AND CORONARY ANGIOGRAPHY;  Surgeon: Mady [name], MD;  Location: [facility] INVASIVE CV LAB;  Service: Cardiovascular;  Laterality: N/A;   SUBQ ICD IMPLANT N/A 11/26/2020   Procedure: SUBQ ICD IMPLANT;  Surgeon: [name], MD;  Location: [facility] INVASIVE CV LAB;  Service: Cardiovascular;  Laterality: N/A;   Patient Active Problem List   Diagnosis Date Noted   Apraxia 10/05/2021   S/P ICD (internal cardiac defibrillator) procedure 11/27/2020   Hx of Cardiac arrest (HCC) 11/26/2020    Abnormality of gait 03/16/2020   Sleep disturbance 03/16/2020   Oropharyngeal dysphagia 02/12/2020   Coronary artery disease involving native coronary artery of native heart without angina pectoris 12/12/2019   Ischemic cardiomyopathy 12/12/2019   Brain injury (HCC)    Prediabetes    Anoxic brain injury (HCC) 11/05/2019   Dysphagia 11/05/2019   Physical deconditioning 11/05/2019   Acute delirium 11/05/2019   Respiratory failure (HCC)    Encounter for central line placement    Ventricular fibrillation (HCC) 09/23/2019   Acute combined systolic and diastolic heart failure (HCC) 09/23/2019   Ventricular tachycardia, polymorphic (HCC) 09/23/2019   Pelvic pain affecting pregnancy 10/15/2015   Supervision of normal pregnancy in third trimester 09/28/2015   Poor weight gain of pregnancy 09/28/2015   Iron  deficiency anemia of pregnancy 09/01/2015   Increased BMI (body mass index) 07/29/2015   ONSET DATE: 09/23/19  REFERRING DIAG: Anoxic Brain Injury  THERAPY DIAG:  Muscle weakness (generalized)  Other lack of coordination  Anoxic brain injury (HCC)  Rationale for Evaluation and Treatment: Rehabilitation  SUBJECTIVE:  SUBJECTIVE STATEMENT: Mother reports a family member was helping pt at home and noted improved reach behind her back. Pt accompanied by:  Mother  PERTINENT HISTORY:  Pt. Is a 38 y.o. female with a history of severe aphasia  from a Cardiac Arrest leading to Anoxic Brain Injury on 09/23/19 at 2 weeks postpartum. Pt. Currently  hs a G-Tube in place. PMHx includes: CAD, HLD, HTN, Ischemic cardiomyopathy, STEMI, V-Fib, and ICD on 11/29/20.  PRECAUTIONS: None  WEIGHT BEARING RESTRICTIONS: No  PAIN:  Are you having pain? No (Faces scale)  FALLS: Has patient fallen in last 6 months? No  LIVING ENVIRONMENT: Lives with: mother Lives in: House/apartment Stairs:  two story; No steps to enter. 10 steps inside, and has a chair lift Has following equipment at home:  Walker - 2 wheeled and Wheelchair (manual), stair chair lift  PLOF: Independent  PATIENT GOALS: To improve bilateral hand function for increased engagement in ADLs, and IADL tasks.  OBJECTIVE:  Note: Objective measures were completed at Evaluation unless otherwise noted.  HAND DOMINANCE: Left  ADLs: Per father report  Transfers/ambulation related to ADLs: Eating:  Dependent/feeding tube Grooming: Independent washing face, assist with hair care  UB Dressing: Assist with donning shirt LB Dressing: Assist with donning LE clothing  Toileting: Assist with transfers, toilet hygiene skills, and clothing negotiation Bathing:  Unknown  IADLs: Per father report Shopping: Total A Light housekeeping: Total A Meal Prep: Total A Medication management:   Total A Financial management: Total A Handwriting: 50% legible printing name only using 1 inch letters.  MOBILITY STATUS: Needs Assist: using walker  POSTURE COMMENTS: Sitting balance:  fair  ACTIVITY TOLERANCE: Activity tolerance:  Fair   FUNCTIONAL OUTCOME MEASURES: TBD  UPPER EXTREMITY ROM:    Active ROM Right Eval WFL Left Eval [facility]  Shoulder flexion    Shoulder abduction    Shoulder adduction    Shoulder extension    Shoulder internal rotation    Shoulder external rotation    Elbow flexion    Elbow extension    Wrist flexion    Wrist extension    Wrist ulnar deviation    Wrist radial deviation    Wrist pronation    Wrist supination    (Blank rows = not tested)  UPPER EXTREMITY MMT:     MMT Right eval Right 10/15/23 Left eval Left 10/15/23  Shoulder flexion 3/5 4-/5 3+/5 4-/5  Shoulder abduction 3/5 4-/5 3+/5 4-/5  Shoulder adduction      Shoulder extension      Shoulder internal rotation      Shoulder external rotation      Middle trapezius      Lower trapezius      Elbow flexion 3/5 4-/5 3+/5 4-/5  Elbow extension 3/5 4+/5 3+/5 4-/5  Wrist flexion      Wrist extension 3/5 3+/5 3+/5 4-/5  Wrist  ulnar deviation      Wrist radial deviation      Wrist pronation      Wrist supination      (Blank rows = not tested)  HAND FUNCTION: Grip strength: Right: 10 lbs; Left: 18 lbs, Lateral pinch: Right: 5 lbs, Left: 7 lbs.  10/15/23:  Grip strength: Right: 15 lbs; Left: 25 lbs, Lateral pinch: Right: 4 lbs, Left: 11 lbs.   COORDINATION:  Eval:  Nine Hole Peg test: Pt. was able to formulate isolated 2nd digit extension in anticipation for grasping the horizontal pegs.  10/15/2023:   9 Hole Peg Test:   -Pt. was unable to grasp the pegs from a horizontal position, and transition them to a vertical position in preparation for placing them into the pegboard.   -To remove pegs only: Right: 1 min. & 45 sec.;  Left: 1 min. & 16 sec.   SENSATION: Light touch intact  EDEMA: N/A  COGNITION: Overall cognitive status: impaired   VISION:  Baseline:  wears glasses.  VISION ASSESSMENT: To be further assessed in functional context  PERCEPTION: WFL  PRAXIS: WFL                                                                                                       TREATMENT DATE: 11/19/2023 Therapeutic Activity: -Promoted R/L active shoulder IR behind back reaching for cards from a deck, working towards improved reach behind back for pulling shirt down, then overhead reach above head for donning shirt overhead.  10 reps on each arm in each direction noted. -Facilitated lateral pinch strength in each hand, having pt pinch and pull cards down from therapist's grasp above head and behind back.  -Facilitated R/L grip strengthening with pt working to maintain grasp of items against a strong tug from therapist (washcloth, 2 lb dumbbell), sustaining grasp in a variety of UE positions, including neutral forearm, supinated, and pronated forearm.    Self Care: -Practiced pulling shirt down behind back, alternating reach with R/L, requiring hand over hand assist to initiate  -Practiced towel  drying mouth and chin following use of spit cup, wash cloth in L hand; vc to initiate  PATIENT EDUCATION: Education details:  ADL training  Person educated: Counselling psychologist, pt Education method: Explanation and Verbal cues, tactile cues Education comprehension: verbalized understanding  HOME EXERCISE PROGRAM:  -increasing opportunities for engaging the bilateral hands during ADL tasks.  GOALS: Goals reviewed with patient? Yes  SHORT TERM GOALS: Target date: 11/26/2023  Pt. Will require supervision HEPs for BUE strengthening  Baseline: Eval: No current HEP. Goal status: INITIAL   LONG TERM GOALS: Target date: 01/07/2024   Pt. Will increase BUE strength by 2 mm grades to assist with ADLs, and IADLs. Baseline: 10/15/23: RUE: Right shoulder flexion:  4-/5, abduction: 4-/5, elbow flexion: 4-/5, elbow extension: 4+/5, wrist extension: 3+/5; LUE: 4-/5 overall Eval:  RUE: 3/5 overall, LUE: 3+/5 overall Goal status: Progressing/Ongoing  2.  Pt. will improve bilateral grip strength by 5# to be able to securely hold items in her hands. Baseline: 10/15/23: Grip Right: 15#, Left: 25# Eval: Grip Right: 10#, Left: 18# Goal status: Progressing/Ongoing  3.  Pt. Will improve bilateral lateral pinch strength by 3# to assist with grooming tasks. Baseline: 10/15/23: lateral pinch strength:  R: 4#, L: 11# Eval: R: 4#, L: 7# Goal status: Progressing/Ongoing  4.  Pt. Will improve bilateral hand FMC grasping, and placing one peg in the 9 hole peg test in preparation for improved manipulation of small objects. Baseline: 10/15/23: 9 Hole Peg Test: Removing pegs only: R: 1 min. & 45 sec.; L: 1 min. &16 sec. Eval: Pt. is able to initiate formulating isolated 2nd digit extension in preparation for grasping small objects. Goal status: Progressing/Ongoing  5.  Pt.  Will write her name her full name with 75% legibility. Baseline: 10/15/23: Continue Eval: 50% legible printing name only using 1 inch letters. Goal status:  Ongoing  6. Pt.  Will demonstrate modified techniques for ADLs with minA.  Baseline: 10/15/23: requires mod/maxA  Eval: Does not currently use adaptive, or modified techniques during ADLS.  Goal status: ongoing  7.  Pt will brush teeth with set up and mod A for thoroughness.  Baseline: 10/15/23: MaxA 08/29/23: Dep  Goal status:Ongoing  8.  Pt will don tshirt with min A.  Baseline: 10/15/23: ModA per mother. 08/29/23: Max-dep; mother reports pt gets her shirt turned around and often struggles to pull over her head  Goal status: Ongoing  9. Pt will spit saliva into cup using cup with handle and wipe mouth with washcloth/hand towel with supv-min A.  Baseline: 10/15/23: Mod-maxA 08/29/23: Max-dep; pt squeezes plastic cup too hard/paper cup slips from hands.  Pt struggles to wipe mouth thoroughly with napkin or tissue.  Goal status: Ongoing   ASSESSMENT:  CLINICAL IMPRESSION: Mom reported that a family member had commented this weekend, I didn't know pt could reach that far, when pt was observed to reach behind her back while being assisted with her shirt.  Pt with good participation in card activity to facilitate reaching over head and behind back, also targeting pinch and grip strength against a strong tug from therapist using cards, dumbbell, and washcloth.  Pt is demonstrating improved reach behind her back, and required only occasional tactile cues from OT to initiate L IR reaching pattern as compared to constant hand over hand requirement last visit.  Pt can reach to low back, not quite midline with RUE, and is reaching to posterior hip with the LUE; improved from last session as pt was not yet clearing iliac crest on either side at that time.  Pt. continues to work on improving BUE functioning, and ADL/IADL tasks. Pt. continues to benefit from OT services to maximize pt's participation in ADLs, increase ADL safety, and provide caregiver education as needed to ease caregiver assist with ADLs.    PERFORMANCE DEFICITS: in functional skills including ADLs, IADLs, coordination, dexterity, proprioception, ROM, strength, pain, Fine motor control, and UE functional use, cognitive skills including attention, memory, problem solving, and safety awareness, and psychosocial skills including coping strategies, environmental adaptation, and routines and behaviors.   IMPAIRMENTS: are limiting patient from ADLs, IADLs, and leisure.   CO-MORBIDITIES: may have co-morbidities  that affects occupational performance. Patient will benefit from skilled OT to address above impairments and improve overall function.  MODIFICATION OR ASSISTANCE TO COMPLETE EVALUATION: Min-Moderate modification of tasks or assist with assess necessary to complete an evaluation.  OT OCCUPATIONAL PROFILE AND HISTORY: Detailed assessment: Review of records and additional review of physical, cognitive, psychosocial history related to current functional performance.  CLINICAL DECISION MAKING: Moderate - several treatment options, min-mod task modification necessary  REHAB POTENTIAL: Good  EVALUATION COMPLEXITY: Moderate    PLAN:  OT FREQUENCY: 1x/week  OT DURATION: 12 weeks  PLANNED INTERVENTIONS: 97535 self care/ADL training, 02889 therapeutic exercise, 97530 therapeutic activity, 97112 neuromuscular re-education, 97140 manual therapy, 97018 paraffin, 02989 moist heat, 97760 Orthotic Initial, 97761 Prosthetic Initial, 97763 Orthotic/Prosthetic subsequent, functional mobility training, visual/perceptual remediation/compensation, energy conservation, patient/family education, and DME and/or AE instructions  RECOMMENDED OTHER SERVICES: PT/ST  CONSULTED AND AGREED WITH PLAN OF CARE: Patient  PLAN FOR NEXT SESSION: Treatment  Inocente Blazing, MS, OTR/L

## 2023-11-21 ENCOUNTER — Ambulatory Visit

## 2023-11-21 ENCOUNTER — Encounter

## 2023-11-21 DIAGNOSIS — R2681 Unsteadiness on feet: Secondary | ICD-10-CM

## 2023-11-21 DIAGNOSIS — R278 Other lack of coordination: Secondary | ICD-10-CM

## 2023-11-21 DIAGNOSIS — M6281 Muscle weakness (generalized): Secondary | ICD-10-CM | POA: Diagnosis not present

## 2023-11-21 DIAGNOSIS — R41841 Cognitive communication deficit: Secondary | ICD-10-CM

## 2023-11-21 DIAGNOSIS — R269 Unspecified abnormalities of gait and mobility: Secondary | ICD-10-CM

## 2023-11-21 DIAGNOSIS — G931 Anoxic brain damage, not elsewhere classified: Secondary | ICD-10-CM

## 2023-11-21 DIAGNOSIS — R262 Difficulty in walking, not elsewhere classified: Secondary | ICD-10-CM

## 2023-11-21 DIAGNOSIS — R2689 Other abnormalities of gait and mobility: Secondary | ICD-10-CM

## 2023-11-21 NOTE — Therapy (Signed)
 OUTPATIENT PHYSICAL THERAPY TREATMENT    Patient Name: Beverly [name] MRN: [id number] DOB:Apr 02, 1985, 38 y.o., female Today's Date: 11/21/2023  PCP: [facility], [facility] REFERRING PROVIDER: Babs Arthea DASEN, MD  END OF SESSION:   PT End of Session - 11/21/23 1410     Visit Number 37    Number of Visits 59    Date for Recertification  02/06/24    Authorization Type UHC Dual Complete; Medicaid secondary    Progress Note Due on Visit 40    PT Start Time 1404    PT Stop Time 1444    PT Time Calculation (min) 40 min    Equipment Utilized During Treatment Gait belt    Activity Tolerance Patient tolerated treatment well    Behavior During Therapy Flat affect;Impulsive;WFL for tasks assessed/performed         Past Medical History:  Diagnosis Date   Brain injury [facility])    CAD (coronary artery disease)    HLD (hyperlipidemia)    Hypertension    last pregnancy   Ischemic cardiomyopathy    STEMI (ST elevation myocardial infarction) (HCC) 09/2019   SCAD with aneurysmal dilation of proximal LAD.   Vaginal Pap smear, abnormal    when she was 38yo   Ventricular fibrillation (HCC) 09/23/2019   Past Surgical History:  Procedure Laterality Date   CARDIAC CATHETERIZATION     IR GASTROSTOMY TUBE MOD SED  10/07/2019   IR GASTROSTOMY TUBE REMOVAL  07/02/2020   IR REPLACE G-TUBE SIMPLE WO FLUORO  09/28/2023   IR REPLC GASTRO/COLONIC TUBE PERCUT W/FLUORO  11/03/2021   IR REPLC GASTRO/COLONIC TUBE PERCUT W/FLUORO  03/09/2023   LEFT HEART CATH AND CORONARY ANGIOGRAPHY N/A 09/23/2019   Procedure: LEFT HEART CATH AND CORONARY ANGIOGRAPHY;  Surgeon: Mady [name], MD;  Location: [facility] INVASIVE CV LAB;  Service: Cardiovascular;  Laterality: N/A;   SUBQ ICD IMPLANT N/A 11/26/2020   Procedure: SUBQ ICD IMPLANT;  Surgeon: [name], MD;  Location: [facility] INVASIVE CV LAB;  Service: Cardiovascular;  Laterality: N/A;   Patient Active Problem List   Diagnosis Date Noted    Apraxia 10/05/2021   S/P ICD (internal cardiac defibrillator) procedure 11/27/2020   Hx of Cardiac arrest (HCC) 11/26/2020   Abnormality of gait 03/16/2020   Sleep disturbance 03/16/2020   Oropharyngeal dysphagia 02/12/2020   Coronary artery disease involving native coronary artery of native heart without angina pectoris 12/12/2019   Ischemic cardiomyopathy 12/12/2019   Brain injury (HCC)    Prediabetes    Anoxic brain injury (HCC) 11/05/2019   Dysphagia 11/05/2019   Physical deconditioning 11/05/2019   Acute delirium 11/05/2019   Respiratory failure (HCC)    Encounter for central line placement    Ventricular fibrillation (HCC) 09/23/2019   Acute combined systolic and diastolic heart failure (HCC) 09/23/2019   Ventricular tachycardia, polymorphic (HCC) 09/23/2019   Pelvic pain affecting pregnancy 10/15/2015   Supervision of normal pregnancy in third trimester 09/28/2015   Poor weight gain of pregnancy 09/28/2015   Iron  deficiency anemia of pregnancy 09/01/2015   Increased BMI (body mass index) 07/29/2015    ONSET DATE: August 2021  REFERRING DIAG:  G93.1 (ICD-10-CM) - Anoxic brain injury (HCC)  R48.2 (ICD-10-CM) - Apraxia    THERAPY DIAG:   Muscle weakness (generalized)  Other lack of coordination  Anoxic brain injury (HCC)  Difficulty in walking, not elsewhere classified  Other abnormalities of gait and mobility  Unsteadiness on feet  Abnormality of gait and mobility  Cognitive communication  deficit  Rationale for Evaluation and Treatment: Rehabilitation  SUBJECTIVE:                                                                                                                                                                                             SUBJECTIVE STATEMENT:   Pt denies any pain upon arrival to the clinic.  Pt denies any other complaints at this time.   Pt accompanied by: self and father  PERTINENT HISTORY:   Patient is a 38 year old  with diagnois of anoxic brain injury. Pt known to PT clinic. Previously seen for outpatient services March 2024. Pt was receiving HH PT until October 2024 per her mother's report. [name] reports pt using HHA for ambulation, was using RW but she says was told not to do to difficulty with safe technique. Pt has fallen 1x in the past six months trying to ambulate on her own. Pt requires assistance with ADLs, including bathing and dressing. [name] reports pt never indicates any pain. Pt with severe aphasia so is unable to answer questions/pt's mom provides hx. She reports pt has impaired short-term memory, difficulty with stair climbing (has stair lift at home). She reports pt's BLE are weaker than they used to be. The pt is probably limited to <10 min with ambulation due to poor endurance. Pt reports her goal is to be able to use an AD to achieve mod-I mobility rather than relying on someone's HHA to ambulate.She was receiving outpatient PT until Feb. 2022 last year but ran out of visits. Patient has past medical history of Anoxic Brain Injury 09/2019, CAD, HLD, HTN, ISCHEMIC CARDIMYOPATHY, STEMI, V-FIB, ICD on 11/29/2020. Patient is currently living with mom. Mother reports she is abel to walk some with a walker with supervision but primarily ambulates with HHA of mother who is her guardian. Mother also reports she requires assist for all transfers and ADL's.   PAIN:  Are you having pain? No  PRECAUTIONS: Fall  WEIGHT BEARING RESTRICTIONS: No  FALLS: Has patient fallen in last 6 months? Yes. Number of falls 1  LIVING ENVIRONMENT: Lives with: lives with their family, mother Lives in: House/apartment Stairs: has stairs but stair lift installed, pt using lift Has following equipment at home: Environmental consultant - 2 wheeled, Wheelchair (manual), shower chair, and Grab bars, pt uses stair lift  PLOF: Needs assistance with ADLs  PATIENT GOALS: balance, walking, and pt states basketball  OBJECTIVE:  Note:  Objective measures were completed at Evaluation unless otherwise noted.  DIAGNOSTIC FINDINGS:  None recent in chart  COGNITION: Overall cognitive status: impaired   SENSATION: Unable  to complete testing due to impaired cognition, pt unable to report if sensation impairment present, pt mother unsure   EDEMA:  Mom reports pt's feet do swell from time to time   LOWER EXTREMITY MMT:  *accuracy of testing likely impacted by pt difficulty following cues  MMT Right Eval Left Eval  Hip flexion 4+ 4+  Hip extension    Hip abduction 4+ 4+  Hip adduction 4+ 4+  Hip internal rotation    Hip external rotation    Knee flexion 4+ 4+  Knee extension 5 5  Ankle dorsiflexion    Ankle plantarflexion    Ankle inversion    Ankle eversion    (Blank rows = not tested)  TRANSFERS: HHA  tried in session, very unsteady, pt has difficulty with motor planning, little success with cues due to distraction, very unsafe Attempted in session with RW, pt unsteady, gait ataxic, continued significant difficulty with motor planning requiring up to min a + 2 and multi-modal cues to safely sit in chair.   FUNCTIONAL TESTS:  5 times sit to stand: 1 min 43 sec Timed up and go (TUG): 1 min 30 sec with RW 10 meter walk test: 0.25 m/s with RW extensive cuing to complete activity  PATIENT SURVEYS:  SIS-16 filled out by pt's mother, used as questions relevant to pt: 45                                                                                                                              TREATMENT DATE: 11/21/2023   Pt arrived to therapy session in transport chair.  Unless otherwise stated, CGA/min A was provided and gait belt donned in order to ensure pt safety throughout session.  TA:   Sit to stand with heavy VC for hand placement- (with both rising and sitting down) 2 x 10 reps.   Ambulation using RW with theraband around the hips and tied to the walker for tactile feedback, CGA to min A with  navigation and pt was much more better with ambulation and stayed closer/within the walker when ambulating. 3 trials of 150 feet   Fwd/Retro walk- RW with TB tied around hips from back end of one of walker to the other to provide tactile cue to stay within walker - x 20 feet x 2 (max difficulty with retro gait)          PATIENT EDUCATION: Education details: goal reassessment, indications, exercise technique Person educated: Patient and Parent Education method: Explanation, Demonstration, Tactile cues, and Verbal cues Education comprehension: returned demonstration, verbal cues required, tactile cues required, and needs further education   HOME EXERCISE PROGRAM: Access Code: [email or web address] URL: [email or web address] Date: 06/11/2023 Prepared by: Massie Dollar  Exercises - Standing March with Counter Support  - 1 x daily - 7 x weekly - 2 sets - 8 reps - Side Stepping with Counter Support  - 1 x daily - 7 x weekly - 2 sets -  4 reps - Sit to Stand with Armchair  - 1 x daily - 7 x weekly - 2 sets - 5 reps - Seated March  - 1 x daily - 7 x weekly - 3 sets - 10 reps  GOALS: Goals reviewed with patient? Yes   SHORT TERM GOALS: Target date: 07/11/2023  Patient will be independent in home exercise program to improve strength/mobility for better functional independence with ADLs. Baseline: Goal status: INITIAL   LONG TERM GOALS: Target date: 02/06/2024  1.  Patient will complete five times sit to stand test in < 35 seconds indicating an increased LE strength and improved balance. Baseline: previously 37 sec with UUE 04/19/22 today 05/30/23: 1 min 43 seconds; 6/3: 41 sec with improved technique (use of BUE on armrests);  08/21/2023= 38 sec with some distractibility and VC for hand placement 7:23: 39.06 (41.23, 36.89 sec) 10/29/23: 56.85 with HHA; 1 min 16 sec with B UE on armrests 11/14/2023= 32.68 sec with BUE Support (VC for consistency of hand position)  Goal status:  PROGRESSING but will keep active to ensure consistency  2. Patient will demo ability to ambulate > 400 ft w/ LRAD versus hand held assist min assist to demonstrate improved household mobility and short community distances.  Baseline: 4/28: 145ft with RW. Mod assist for AD management to prevent veer to the R. 6/3: ~ 300 ft with RW, requires cuing throughout for proximity to RW particularly with turns, but this has improved compared to eval;  08/21/2023= Patient ambulated approx 300 feet with RW- Sill some min assist at times to navigate walker and VC to remain close to walker.  7/23: 39ft with RW and min assist for safety and moderate instruction for safety AD intermittently, especially with fatigue  10/29/23:  Pt able to ambulate 500' with RW, however required modA at times to keep the walker stable 11/14/2023= Will assess next visit Goal status: IN PROGRESS  3.  Patient will increase 10 meter walk test to >1.99m/s as to improve gait speed for better community ambulation and to reduce fall risk. Baseline: previously 0.36 m/s on 04/19/22 : 0.25 m/s;  07/10/23: 0. 24 m/s with RW;  08/15/2023= 0.39 m/s  with RW yet max VC for RW navigation.  7/23:Average Normal speed with RW: 0.25 m/s Average Normal speed with HHA: 0.108m/s 10/29/23: Average Normal speed with RW: 0.23 m/s 11/14/2023= 0.28 m/s with RW (VC for navigation); 0.45 m/s with HHA Goal status: PROGRESSING  4.  Patient will reduce timed up and go (TUG) to <50 seconds to reduce fall risk and demonstrate improved transfer/gait ability Baseline: previously 91 sec with RW on 04/19/22; 05/30/23: 1 min 30 sec with RW 07/19/2023: 48.265 seconds using RW with skilled min A primarily for AD management with pt having difficulty turning with AD;  08/21/2023- 33 sec with HHA and 1 min 25 sec with RW (Max VC for safety with turning back to seat and to reach back safely)  7/23:42.18sec with RW and min assist for safety in turn; 40.35 sec with HHA and min assist for  safety with turns.  10/29/23: TBD 11/14/2023= 43 sec with RW with difficulty turning 180 at end and max VC to reach back; 31 sec with HHA today. VC for turning.  Goal status: MET  5. Pt will exhibit correct and safe technique with stand>sit into a standard chair or transport chair to reduce risk of falling.   Baseline: pt current technique unsafe, poor motor planning; 6/3: pt with improved technique,  bring RW closer to chair prior to sitting; 08/21/2023- Patient able to perform with CGA yet inconsistent with cues for safety and consistency with hand placement.  10/29/23: Pt still with inconsistent STS's, utilizing heavy UE assistance  11/14/2023= Patient continues to be inconsistent but performed around 10 times today and able to reach back and push up using UE appropriately 7/10 times.  Goal status: PARTIALLY MET   6. The pt will demonstrate at least a 15 point improvement on SIS-16 to indicate increased ease with ADLs. Baseline: 45 (filled out by pt's mother); 42; 08/21/2023= 46; 09/03/2023: 46/80 10/29/23: TBD 11/19/2023- Mother not present so will assess next visit she is present. Goal status: ONGOING   ASSESSMENT:  CLINICAL IMPRESSION:  Treatment focused on success from last visit with walking with walker with TB tied posterior to walker to keep patient inside of walker. She demonstrated much improved posture with standing and forward walking. She required some assist with navigating walker but able to stay within walker so much better overall today. She continues to require constant cues for hand placement with transfers. Pt will continue to benefit from skilled therapy to address remaining deficits in order to improve overall QoL and return to PLOF.          OBJECTIVE IMPAIRMENTS: Abnormal gait, decreased activity tolerance, decreased balance, decreased coordination, decreased endurance, decreased knowledge of use of DME, decreased mobility, difficulty walking, decreased strength,  decreased safety awareness, increased edema, impaired tone, improper body mechanics, and postural dysfunction.   ACTIVITY LIMITATIONS: carrying, lifting, bending, standing, squatting, stairs, transfers, bathing, dressing, locomotion level, and caring for others  PARTICIPATION LIMITATIONS: meal prep, cleaning, laundry, medication management, personal finances, driving, shopping, community activity, occupation, and yard work  PERSONAL FACTORS: Sex, Time since onset of injury/illness/exacerbation, and 1-2 comorbidities: per chart PMH includes CAD, HTN, ischemic cardiomyopathy, STEMI, ventricular fibrillation, dysphagia, s/p ICD, apraxia  are also affecting patient's functional outcome.   REHAB POTENTIAL: Fair    CLINICAL DECISION MAKING: Evolving/moderate complexity  EVALUATION COMPLEXITY: High  PLAN:  PT FREQUENCY: 1-2x/week  PT DURATION: 12 weeks  PLANNED INTERVENTIONS: 97164- PT Re-evaluation, 97750- Physical Performance Testing, 97110-Therapeutic exercises, 97530- Therapeutic activity, 97112- Neuromuscular re-education, 97535- Self Care, [id number]- Manual therapy, [phone]- Gait training, [phone]- Orthotic Initial, [phone]- Orthotic/Prosthetic subsequent, [phone]- Canalith repositioning, Patient/Family education, Balance training, Stair training, Taping, Joint mobilization, Spinal mobilization, Vestibular training, DME instructions, Wheelchair mobility training, Cryotherapy, and Moist heat  PLAN FOR NEXT SESSION:   backwards gait training using RW with skilled assistance - perform in environment with limited distractions to allow improved motor learning Transfer training - learning sequence of AD management and stepping back to seat Gait training using RW in home-like environment Dynamic balance Reaching with use of Blaze Pods for tapping hands   Chyrl London, PT Physical Therapist - [facility]  11/21/23, 2:49 PM

## 2023-11-22 ENCOUNTER — Ambulatory Visit: Payer: Self-pay | Admitting: Nurse Practitioner

## 2023-11-26 ENCOUNTER — Ambulatory Visit

## 2023-11-26 DIAGNOSIS — R278 Other lack of coordination: Secondary | ICD-10-CM

## 2023-11-26 DIAGNOSIS — R2681 Unsteadiness on feet: Secondary | ICD-10-CM

## 2023-11-26 DIAGNOSIS — R262 Difficulty in walking, not elsewhere classified: Secondary | ICD-10-CM

## 2023-11-26 DIAGNOSIS — G931 Anoxic brain damage, not elsewhere classified: Secondary | ICD-10-CM

## 2023-11-26 DIAGNOSIS — R2689 Other abnormalities of gait and mobility: Secondary | ICD-10-CM

## 2023-11-26 DIAGNOSIS — M6281 Muscle weakness (generalized): Secondary | ICD-10-CM | POA: Diagnosis not present

## 2023-11-26 DIAGNOSIS — R41841 Cognitive communication deficit: Secondary | ICD-10-CM

## 2023-11-26 DIAGNOSIS — R269 Unspecified abnormalities of gait and mobility: Secondary | ICD-10-CM

## 2023-11-26 NOTE — Therapy (Signed)
 OUTPATIENT PHYSICAL THERAPY TREATMENT    Patient Name: Beverly [name] MRN: [id number] DOB:[birth year], 38 y.o., female Today's Date: 11/26/2023  PCP: [facility] REFERRING PROVIDER: Babs Arthea DASEN, MD  END OF SESSION:   PT End of Session - 11/26/23 1401     Visit Number 38    Number of Visits 59    Date for Recertification  02/06/24    Authorization Type UHC Dual Complete; Medicaid secondary    Progress Note Due on Visit 40    PT Start Time 1401    PT Stop Time 1445    PT Time Calculation (min) 44 min    Equipment Utilized During Treatment Gait belt    Activity Tolerance Patient tolerated treatment well    Behavior During Therapy Flat affect;Impulsive;WFL for tasks assessed/performed          Past Medical History:  Diagnosis Date   Brain injury [facility])    CAD (coronary artery disease)    HLD (hyperlipidemia)    Hypertension    last pregnancy   Ischemic cardiomyopathy    STEMI (ST elevation myocardial infarction) (HCC) 09/2019   SCAD with aneurysmal dilation of proximal LAD.   Vaginal Pap smear, abnormal    when she was 38yo   Ventricular fibrillation (HCC) 09/23/2019   Past Surgical History:  Procedure Laterality Date   CARDIAC CATHETERIZATION     IR GASTROSTOMY TUBE MOD SED  10/07/2019   IR GASTROSTOMY TUBE REMOVAL  07/02/2020   IR REPLACE G-TUBE SIMPLE WO FLUORO  09/28/2023   IR REPLC GASTRO/COLONIC TUBE PERCUT W/FLUORO  11/03/2021   IR REPLC GASTRO/COLONIC TUBE PERCUT W/FLUORO  03/09/2023   LEFT HEART CATH AND CORONARY ANGIOGRAPHY N/A 09/23/2019   Procedure: LEFT HEART CATH AND CORONARY ANGIOGRAPHY;  Surgeon: Mady [name], MD;  Location: [facility] INVASIVE CV LAB;  Service: Cardiovascular;  Laterality: N/A;   SUBQ ICD IMPLANT N/A 11/26/2020   Procedure: SUBQ ICD IMPLANT;  Surgeon: [name], MD;  Location: [facility] INVASIVE CV LAB;  Service: Cardiovascular;  Laterality: N/A;   Patient Active Problem List   Diagnosis Date Noted    Apraxia 10/05/2021   S/P ICD (internal cardiac defibrillator) procedure 11/27/2020   Hx of Cardiac arrest (HCC) 11/26/2020   Abnormality of gait 03/16/2020   Sleep disturbance 03/16/2020   Oropharyngeal dysphagia 02/12/2020   Coronary artery disease involving native coronary artery of native heart without angina pectoris 12/12/2019   Ischemic cardiomyopathy 12/12/2019   Brain injury (HCC)    Prediabetes    Anoxic brain injury (HCC) 11/05/2019   Dysphagia 11/05/2019   Physical deconditioning 11/05/2019   Acute delirium 11/05/2019   Respiratory failure (HCC)    Encounter for central line placement    Ventricular fibrillation (HCC) 09/23/2019   Acute combined systolic and diastolic heart failure (HCC) 09/23/2019   Ventricular tachycardia, polymorphic (HCC) 09/23/2019   Pelvic pain affecting pregnancy 10/15/2015   Supervision of normal pregnancy in third trimester 09/28/2015   Poor weight gain of pregnancy 09/28/2015   Iron  deficiency anemia of pregnancy 09/01/2015   Increased BMI (body mass index) 07/29/2015    ONSET DATE: August 2021  REFERRING DIAG:  G93.1 (ICD-10-CM) - Anoxic brain injury (HCC)  R48.2 (ICD-10-CM) - Apraxia    THERAPY DIAG:   Muscle weakness (generalized)  Other lack of coordination  Anoxic brain injury (HCC)  Difficulty in walking, not elsewhere classified  Other abnormalities of gait and mobility  Unsteadiness on feet  Abnormality of gait and mobility  Cognitive  communication deficit  Rationale for Evaluation and Treatment: Rehabilitation  SUBJECTIVE:                                                                                                                                                                                             SUBJECTIVE STATEMENT:   Pt reports she laid around and watched TV over the weekend.  Pt denies any other complaints upon arrival.  Pt accompanied by: self and father  PERTINENT HISTORY:   Patient is a 38  year old with diagnois of anoxic brain injury. Pt known to PT clinic. Previously seen for outpatient services March 2024. Pt was receiving HH PT until October 2024 per her mother's report. [name] reports pt using HHA for ambulation, was using RW but she says was told not to do to difficulty with safe technique. Pt has fallen 1x in the past six months trying to ambulate on her own. Pt requires assistance with ADLs, including bathing and dressing. [name] reports pt never indicates any pain. Pt with severe aphasia so is unable to answer questions/pt's mom provides hx. She reports pt has impaired short-term memory, difficulty with stair climbing (has stair lift at home). She reports pt's BLE are weaker than they used to be. The pt is probably limited to <10 min with ambulation due to poor endurance. Pt reports her goal is to be able to use an AD to achieve mod-I mobility rather than relying on someone's HHA to ambulate.She was receiving outpatient PT until Feb. 2022 last year but ran out of visits. Patient has past medical history of Anoxic Brain Injury 09/2019, CAD, HLD, HTN, ISCHEMIC CARDIMYOPATHY, STEMI, V-FIB, ICD on 11/29/2020. Patient is currently living with mom. Mother reports she is abel to walk some with a walker with supervision but primarily ambulates with HHA of mother who is her guardian. Mother also reports she requires assist for all transfers and ADL's.   PAIN:  Are you having pain? No  PRECAUTIONS: Fall  WEIGHT BEARING RESTRICTIONS: No  FALLS: Has patient fallen in last 6 months? Yes. Number of falls 1  LIVING ENVIRONMENT: Lives with: lives with their family, mother Lives in: House/apartment Stairs: has stairs but stair lift installed, pt using lift Has following equipment at home: Environmental consultant - 2 wheeled, Wheelchair (manual), shower chair, and Grab bars, pt uses stair lift  PLOF: Needs assistance with ADLs  PATIENT GOALS: balance, walking, and pt states  basketball  OBJECTIVE:  Note: Objective measures were completed at Evaluation unless otherwise noted.  DIAGNOSTIC FINDINGS:  None recent in chart  COGNITION: Overall cognitive status: impaired   SENSATION:  Unable to complete testing due to impaired cognition, pt unable to report if sensation impairment present, pt mother unsure   EDEMA:  Mom reports pt's feet do swell from time to time   LOWER EXTREMITY MMT:  *accuracy of testing likely impacted by pt difficulty following cues  MMT Right Eval Left Eval  Hip flexion 4+ 4+  Hip extension    Hip abduction 4+ 4+  Hip adduction 4+ 4+  Hip internal rotation    Hip external rotation    Knee flexion 4+ 4+  Knee extension 5 5  Ankle dorsiflexion    Ankle plantarflexion    Ankle inversion    Ankle eversion    (Blank rows = not tested)  TRANSFERS: HHA  tried in session, very unsteady, pt has difficulty with motor planning, little success with cues due to distraction, very unsafe Attempted in session with RW, pt unsteady, gait ataxic, continued significant difficulty with motor planning requiring up to min a + 2 and multi-modal cues to safely sit in chair.   FUNCTIONAL TESTS:  5 times sit to stand: 1 min 43 sec Timed up and go (TUG): 1 min 30 sec with RW 10 meter walk test: 0.25 m/s with RW extensive cuing to complete activity  PATIENT SURVEYS:  SIS-16 filled out by pt's mother, used as questions relevant to pt: 45                                                                                                                              TREATMENT DATE: 11/26/2023   Pt arrived to therapy session in transport chair.  Unless otherwise stated, CGA/min A was provided and gait belt donned in order to ensure pt safety throughout session.  TherAct:   STS 2x10, with pt requiring short verbal cues for task as pt is continuously distracted by surrounding people/obstacles  Ambulation using RW with theraband around the hips and  tied to the walker for tactile feedback, CGA to min A with navigation and pt was much more better with ambulation and stayed closer/within the walker when ambulating. 3 trials of 150 feet each attempt  Blocked practice of forward/retro gait with use of the walker to promote pt's ability to transfer to chair with use of the retro gait and RW.  Pt with continued difficulty performing this task       PATIENT EDUCATION: Education details: goal reassessment, indications, exercise technique Person educated: Patient and Parent Education method: Explanation, Demonstration, Tactile cues, and Verbal cues Education comprehension: returned demonstration, verbal cues required, tactile cues required, and needs further education   HOME EXERCISE PROGRAM: Access Code: [email or web address] URL: [email or web address] Date: 06/11/2023 Prepared by: Massie Dollar  Exercises - Standing March with Counter Support  - 1 x daily - 7 x weekly - 2 sets - 8 reps - Side Stepping with Counter Support  - 1 x daily - 7 x weekly - 2 sets - 4 reps - Sit to Stand  with Armchair  - 1 x daily - 7 x weekly - 2 sets - 5 reps - Seated March  - 1 x daily - 7 x weekly - 3 sets - 10 reps  GOALS: Goals reviewed with patient? Yes   SHORT TERM GOALS: Target date: 07/11/2023  Patient will be independent in home exercise program to improve strength/mobility for better functional independence with ADLs. Baseline: Goal status: INITIAL   LONG TERM GOALS: Target date: 02/06/2024  1.  Patient will complete five times sit to stand test in < 35 seconds indicating an increased LE strength and improved balance. Baseline: previously 37 sec with UUE 04/19/22 today 05/30/23: 1 min 43 seconds; 6/3: 41 sec with improved technique (use of BUE on armrests);  08/21/2023= 38 sec with some distractibility and VC for hand placement 7:23: 39.06 (41.23, 36.89 sec) 10/29/23: 56.85 with HHA; 1 min 16 sec with B UE on armrests 11/14/2023= 32.68 sec  with BUE Support (VC for consistency of hand position)  Goal status: PROGRESSING but will keep active to ensure consistency  2. Patient will demo ability to ambulate > 400 ft w/ LRAD versus hand held assist min assist to demonstrate improved household mobility and short community distances.  Baseline: 4/28: 162ft with RW. Mod assist for AD management to prevent veer to the R. 6/3: ~ 300 ft with RW, requires cuing throughout for proximity to RW particularly with turns, but this has improved compared to eval;  08/21/2023= Patient ambulated approx 300 feet with RW- Sill some min assist at times to navigate walker and VC to remain close to walker.  7/23: 3109ft with RW and min assist for safety and moderate instruction for safety AD intermittently, especially with fatigue  10/29/23:  Pt able to ambulate 500' with RW, however required modA at times to keep the walker stable 11/14/2023= Will assess next visit Goal status: IN PROGRESS  3.  Patient will increase 10 meter walk test to >1.14m/s as to improve gait speed for better community ambulation and to reduce fall risk. Baseline: previously 0.36 m/s on 04/19/22 : 0.25 m/s;  07/10/23: 0. 24 m/s with RW;  08/15/2023= 0.39 m/s  with RW yet max VC for RW navigation.  7/23:Average Normal speed with RW: 0.25 m/s Average Normal speed with HHA: 0.17m/s 10/29/23: Average Normal speed with RW: 0.23 m/s 11/14/2023= 0.28 m/s with RW (VC for navigation); 0.45 m/s with HHA Goal status: PROGRESSING  4.  Patient will reduce timed up and go (TUG) to <50 seconds to reduce fall risk and demonstrate improved transfer/gait ability Baseline: previously 91 sec with RW on 04/19/22; 05/30/23: 1 min 30 sec with RW 07/19/2023: 48.265 seconds using RW with skilled min A primarily for AD management with pt having difficulty turning with AD;  08/21/2023- 33 sec with HHA and 1 min 25 sec with RW (Max VC for safety with turning back to seat and to reach back safely)  7/23:42.18sec with RW and  min assist for safety in turn; 40.35 sec with HHA and min assist for safety with turns.  10/29/23: TBD 11/14/2023= 43 sec with RW with difficulty turning 180 at end and max VC to reach back; 31 sec with HHA today. VC for turning.  Goal status: MET  5. Pt will exhibit correct and safe technique with stand>sit into a standard chair or transport chair to reduce risk of falling.   Baseline: pt current technique unsafe, poor motor planning; 6/3: pt with improved technique, bring RW closer to chair prior  to sitting; 08/21/2023- Patient able to perform with CGA yet inconsistent with cues for safety and consistency with hand placement.  10/29/23: Pt still with inconsistent STS's, utilizing heavy UE assistance  11/14/2023= Patient continues to be inconsistent but performed around 10 times today and able to reach back and push up using UE appropriately 7/10 times.  Goal status: PARTIALLY MET   6. The pt will demonstrate at least a 15 point improvement on SIS-16 to indicate increased ease with ADLs. Baseline: 45 (filled out by pt's mother); 42; 08/21/2023= 46; 09/03/2023: 46/80 10/29/23: TBD 11/19/2023- Mother not present so will assess next visit she is present. Goal status: ONGOING   ASSESSMENT:  CLINICAL IMPRESSION:   Pt continues to have difficulty with retro ambulation, but put forth great effort throughout the session.  Pt was able to demonstrate improved strength with STS's today and when not distracted, pt is able to come upright with little to no effort.  Pt does tend to utilize the hands on the arm rests, rather than utilizing LE strength to perform.  Pt will continue to benefit from retro ambulation and transfer training to improve overall tolerance and endurance for increased independence.   Pt will continue to benefit from skilled therapy to address remaining deficits in order to improve overall QoL and return to PLOF.      OBJECTIVE IMPAIRMENTS: Abnormal gait, decreased activity tolerance,  decreased balance, decreased coordination, decreased endurance, decreased knowledge of use of DME, decreased mobility, difficulty walking, decreased strength, decreased safety awareness, increased edema, impaired tone, improper body mechanics, and postural dysfunction.   ACTIVITY LIMITATIONS: carrying, lifting, bending, standing, squatting, stairs, transfers, bathing, dressing, locomotion level, and caring for others  PARTICIPATION LIMITATIONS: meal prep, cleaning, laundry, medication management, personal finances, driving, shopping, community activity, occupation, and yard work  PERSONAL FACTORS: Sex, Time since onset of injury/illness/exacerbation, and 1-2 comorbidities: per chart PMH includes CAD, HTN, ischemic cardiomyopathy, STEMI, ventricular fibrillation, dysphagia, s/p ICD, apraxia  are also affecting patient's functional outcome.   REHAB POTENTIAL: Fair    CLINICAL DECISION MAKING: Evolving/moderate complexity  EVALUATION COMPLEXITY: High  PLAN:  PT FREQUENCY: 1-2x/week  PT DURATION: 12 weeks  PLANNED INTERVENTIONS: [id number]- PT Re-evaluation, 97750- Physical Performance Testing, 97110-Therapeutic exercises, 97530- Therapeutic activity, 97112- Neuromuscular re-education, 97535- Self Care, [id number]- Manual therapy, [phone]- Gait training, [phone]- Orthotic Initial, [phone]- Orthotic/Prosthetic subsequent, [phone]- Canalith repositioning, Patient/Family education, Balance training, Stair training, Taping, Joint mobilization, Spinal mobilization, Vestibular training, DME instructions, Wheelchair mobility training, Cryotherapy, and Moist heat  PLAN FOR NEXT SESSION:   backwards gait training using RW with skilled assistance - perform in environment with limited distractions to allow improved motor learning Transfer training - learning sequence of AD management and stepping back to seat Gait training using RW in home-like environment Dynamic balance Reaching with use of Blaze Pods for tapping  hands    Fonda Simpers, PT, DPT Physical Therapist - [facility]  11/26/23, 5:54 PM

## 2023-11-26 NOTE — Therapy (Signed)
 Occupational Therapy Outpatient Neuro Treatment Note  Patient Name: Beverly [name] MRN: [id number] DOB:March 19, 1985, 38 y.o., female Today's Date: 11/26/2023  REFERRING PROVIDER: Babs Hussar, MD  END OF SESSION:  OT End of Session - 11/26/23 1539     Visit Number 15    Number of Visits 22    Date for Recertification  01/07/24    OT Start Time 1315    OT Stop Time 1400    OT Time Calculation (min) 45 min    Equipment Utilized During Treatment transport chair    Activity Tolerance Patient tolerated treatment well    Behavior During Therapy Flat affect;Impulsive;WFL for tasks assessed/performed         Past Medical History:  Diagnosis Date   Brain injury [name])    CAD (coronary artery disease)    HLD (hyperlipidemia)    Hypertension    last pregnancy   Ischemic cardiomyopathy    STEMI (ST elevation myocardial infarction) (HCC) 09/2019   SCAD with aneurysmal dilation of proximal LAD.   Vaginal Pap smear, abnormal    when she was 38yo   Ventricular fibrillation (HCC) 09/23/2019   Past Surgical History:  Procedure Laterality Date   CARDIAC CATHETERIZATION     IR GASTROSTOMY TUBE MOD SED  10/07/2019   IR GASTROSTOMY TUBE REMOVAL  07/02/2020   IR REPLACE G-TUBE SIMPLE WO FLUORO  09/28/2023   IR REPLC GASTRO/COLONIC TUBE PERCUT W/FLUORO  11/03/2021   IR REPLC GASTRO/COLONIC TUBE PERCUT W/FLUORO  03/09/2023   LEFT HEART CATH AND CORONARY ANGIOGRAPHY N/A 09/23/2019   Procedure: LEFT HEART CATH AND CORONARY ANGIOGRAPHY;  Surgeon: Mady [name], MD;  Location: [facility] INVASIVE CV LAB;  Service: Cardiovascular;  Laterality: N/A;   SUBQ ICD IMPLANT N/A 11/26/2020   Procedure: SUBQ ICD IMPLANT;  Surgeon: [name], MD;  Location: [facility] INVASIVE CV LAB;  Service: Cardiovascular;  Laterality: N/A;   Patient Active Problem List   Diagnosis Date Noted   Apraxia 10/05/2021   S/P ICD (internal cardiac defibrillator) procedure 11/27/2020   Hx of Cardiac arrest (HCC) 11/26/2020    Abnormality of gait 03/16/2020   Sleep disturbance 03/16/2020   Oropharyngeal dysphagia 02/12/2020   Coronary artery disease involving native coronary artery of native heart without angina pectoris 12/12/2019   Ischemic cardiomyopathy 12/12/2019   Brain injury (HCC)    Prediabetes    Anoxic brain injury (HCC) 11/05/2019   Dysphagia 11/05/2019   Physical deconditioning 11/05/2019   Acute delirium 11/05/2019   Respiratory failure (HCC)    Encounter for central line placement    Ventricular fibrillation (HCC) 09/23/2019   Acute combined systolic and diastolic heart failure (HCC) 09/23/2019   Ventricular tachycardia, polymorphic (HCC) 09/23/2019   Pelvic pain affecting pregnancy 10/15/2015   Supervision of normal pregnancy in third trimester 09/28/2015   Poor weight gain of pregnancy 09/28/2015   Iron  deficiency anemia of pregnancy 09/01/2015   Increased BMI (body mass index) 07/29/2015   ONSET DATE: 09/23/19  REFERRING DIAG: Anoxic Brain Injury  THERAPY DIAG:  Muscle weakness (generalized)  Other lack of coordination  Anoxic brain injury (HCC)  Rationale for Evaluation and Treatment: Rehabilitation  SUBJECTIVE:  SUBJECTIVE STATEMENT: Pt acknowledges doing well today. Pt accompanied by:  Mother  PERTINENT HISTORY:  Pt. Is a 38 y.o. female with a history of severe aphasia  from a Cardiac Arrest leading to Anoxic Brain Injury on 09/23/19 at 2 weeks postpartum. Pt. Currently hs a G-Tube in place. PMHx includes: CAD, HLD, HTN, Ischemic cardiomyopathy,  STEMI, V-Fib, and ICD on 11/29/20.  PRECAUTIONS: None  WEIGHT BEARING RESTRICTIONS: No  PAIN:  Are you having pain? No (Faces scale)  FALLS: Has patient fallen in last 6 months? No  LIVING ENVIRONMENT: Lives with: mother Lives in: House/apartment Stairs:  two story; No steps to enter. 10 steps inside, and has a chair lift Has following equipment at home: Walker - 2 wheeled and Wheelchair (manual), stair chair  lift  PLOF: Independent  PATIENT GOALS: To improve bilateral hand function for increased engagement in ADLs, and IADL tasks.  OBJECTIVE:  Note: Objective measures were completed at Evaluation unless otherwise noted.  HAND DOMINANCE: Left  ADLs: Per father report  Transfers/ambulation related to ADLs: Eating:  Dependent/feeding tube Grooming: Independent washing face, assist with hair care  UB Dressing: Assist with donning shirt LB Dressing: Assist with donning LE clothing  Toileting: Assist with transfers, toilet hygiene skills, and clothing negotiation Bathing:  Unknown  IADLs: Per father report Shopping: Total A Light housekeeping: Total A Meal Prep: Total A Medication management:   Total A Financial management: Total A Handwriting: 50% legible printing name only using 1 inch letters.  MOBILITY STATUS: Needs Assist: using walker  POSTURE COMMENTS: Sitting balance:  fair  ACTIVITY TOLERANCE: Activity tolerance:  Fair   FUNCTIONAL OUTCOME MEASURES: TBD  UPPER EXTREMITY ROM:    Active ROM Right Eval WFL Left Eval [facility]  Shoulder flexion    Shoulder abduction    Shoulder adduction    Shoulder extension    Shoulder internal rotation    Shoulder external rotation    Elbow flexion    Elbow extension    Wrist flexion    Wrist extension    Wrist ulnar deviation    Wrist radial deviation    Wrist pronation    Wrist supination    (Blank rows = not tested)  UPPER EXTREMITY MMT:     MMT Right eval Right 10/15/23 Left eval Left 10/15/23  Shoulder flexion 3/5 4-/5 3+/5 4-/5  Shoulder abduction 3/5 4-/5 3+/5 4-/5  Shoulder adduction      Shoulder extension      Shoulder internal rotation      Shoulder external rotation      Middle trapezius      Lower trapezius      Elbow flexion 3/5 4-/5 3+/5 4-/5  Elbow extension 3/5 4+/5 3+/5 4-/5  Wrist flexion      Wrist extension 3/5 3+/5 3+/5 4-/5  Wrist ulnar deviation      Wrist radial deviation      Wrist  pronation      Wrist supination      (Blank rows = not tested)  HAND FUNCTION: Grip strength: Right: 10 lbs; Left: 18 lbs, Lateral pinch: Right: 5 lbs, Left: 7 lbs.  10/15/23:  Grip strength: Right: 15 lbs; Left: 25 lbs, Lateral pinch: Right: 4 lbs, Left: 11 lbs.   COORDINATION:  Eval:  Nine Hole Peg test: Pt. was able to formulate isolated 2nd digit extension in anticipation for grasping the horizontal pegs.  10/15/2023:   9 Hole Peg Test:   -Pt. was unable to grasp the pegs from a horizontal position, and transition them to a vertical position in preparation for placing them into the pegboard.   -To remove pegs only: Right: 1 min. & 45 sec.;  Left: 1 min. & 16 sec.   SENSATION: Light touch intact  EDEMA: N/A   COGNITION: Overall cognitive status: impaired   VISION:  Baseline:  wears glasses.  VISION ASSESSMENT: To be further assessed in functional context  PERCEPTION: WFL  PRAXIS: WFL                                                                                                       TREATMENT DATE: 11/26/2023 Therapeutic Activity: -Facilitated R/L FMC/GMC skills working to pick up [employer] 4 pieces from table top and place into [employer].  Pt required set up on most trials to pick up pieces with R hand using a lateral pinch.  Without assist, pt used 4-5 fingered grasp pattern which then prevented ability to place chip into slot.  Pt used L hand to slide chips to edge of table and place into game board slots with only a few dropped chips.  Self Care: -Participation in grooming tasks, involving bilat hand coordination: New chapstick application and filing chipped nail.  -Vc for opening chap stick top, dep to twist bottom to elevate stick from tube.  Pt used mirror for visual feedback to apply chapstick to lips.  Pt made 2 good attempts to place top back on tube, but unable, despite trials holding tube in either hand. -Filing nails: set up of large nail  file in R hand to file L LF nail.  Pt able to manage intermittent strokes back/forth, but  required max-total A d/t limited attention to task with external distractions in clinic. -4 uses of spit cup, requiring set up assist of cup in L hand, and min A to direct cup to mouth to prevent drool.  Set up/vc to wipe mouth with washcloth after missing cup.  PATIENT EDUCATION: Education details:  ADL training, BUE FMC/GMC training   Person educated: Patient Education method: Explanation and Verbal cues, tactile cues Education comprehension: verbalized understanding  HOME EXERCISE PROGRAM:  -increasing opportunities for engaging the bilateral hands during ADL tasks.  GOALS: Goals reviewed with patient? Yes  SHORT TERM GOALS: Target date: 11/26/2023  Pt. Will require supervision HEPs for BUE strengthening  Baseline: Eval: No current HEP. Goal status: INITIAL   LONG TERM GOALS: Target date: 01/07/2024   Pt. Will increase BUE strength by 2 mm grades to assist with ADLs, and IADLs. Baseline: 10/15/23: RUE: Right shoulder flexion:  4-/5, abduction: 4-/5, elbow flexion: 4-/5, elbow extension: 4+/5, wrist extension: 3+/5; LUE: 4-/5 overall Eval:  RUE: 3/5 overall, LUE: 3+/5 overall Goal status: Progressing/Ongoing  2.  Pt. will improve bilateral grip strength by 5# to be able to securely hold items in her hands. Baseline: 10/15/23: Grip Right: 15#, Left: 25# Eval: Grip Right: 10#, Left: 18# Goal status: Progressing/Ongoing  3.  Pt. Will improve bilateral lateral pinch strength by 3# to assist with grooming tasks. Baseline: 10/15/23: lateral pinch strength:  R: 4#, L: 11# Eval: R: 4#, L: 7# Goal status: Progressing/Ongoing  4.  Pt. Will improve bilateral hand FMC grasping, and placing one peg in the 9 hole peg test in preparation for improved manipulation of small objects. Baseline: 10/15/23: 9 Hole Peg Test: Removing pegs only: R: 1 min. & 45 sec.; L: 1 min. &  16 sec. Eval: Pt. is able to initiate  formulating isolated 2nd digit extension in preparation for grasping small objects. Goal status: Progressing/Ongoing  5.  Pt.  Will write her name her full name with 75% legibility. Baseline: 10/15/23: Continue Eval: 50% legible printing name only using 1 inch letters. Goal status: Ongoing  6. Pt.  Will demonstrate modified techniques for ADLs with minA.  Baseline: 10/15/23: requires mod/maxA Eval: Does not currently use adaptive, or modified techniques during ADLS.  Goal status: ongoing  7.  Pt will brush teeth with set up and mod A for thoroughness.  Baseline: 10/15/23: MaxA 08/29/23: Dep  Goal status:Ongoing  8.  Pt will don tshirt with min A.  Baseline: 10/15/23: ModA per mother. 08/29/23: Max-dep; mother reports pt gets her shirt turned around and often struggles to pull over her head  Goal status: Ongoing  9. Pt will spit saliva into cup using cup with handle and wipe mouth with washcloth/hand towel with supv-min A.  Baseline: 10/15/23: Mod-maxA 08/29/23: Max-dep; pt squeezes plastic cup too hard/paper cup slips from hands.  Pt struggles to wipe mouth thoroughly with napkin or tissue.  Goal status: Ongoing   ASSESSMENT:  CLINICAL IMPRESSION: Pt with good participation in bilat FMC/GMC activities this date, requiring OT assist to formulate lateral pinch in R hand to maintain grasp of [employer] chip, and  vc/extra time to formulate lateral pinch with chip in the L hand.  Pt made good attempts to pick up chips from table on the R hand, but mostly dropped chips to floor d/t limited engagement of the R thumb to stabilize chip within fingers.  Good response to visual feedback to apply chap stick to lips using L hand.  Without mirror, pt struggles to bring spit cup to mouth.  May try in front of mirror in follow up sessions to minimize drool onto shirt.  Max-total A for thorough nail filing, but this was nearing end of session when pt appeared more easily distracted by external stimuli within  clinic.  Pt. continues to benefit from OT services to maximize pt's participation in ADLs, increase ADL safety, and provide caregiver education as needed to ease caregiver assist with ADLs.   PERFORMANCE DEFICITS: in functional skills including ADLs, IADLs, coordination, dexterity, proprioception, ROM, strength, pain, Fine motor control, and UE functional use, cognitive skills including attention, memory, problem solving, and safety awareness, and psychosocial skills including coping strategies, environmental adaptation, and routines and behaviors.   IMPAIRMENTS: are limiting patient from ADLs, IADLs, and leisure.   CO-MORBIDITIES: may have co-morbidities  that affects occupational performance. Patient will benefit from skilled OT to address above impairments and improve overall function.  MODIFICATION OR ASSISTANCE TO COMPLETE EVALUATION: Min-Moderate modification of tasks or assist with assess necessary to complete an evaluation.  OT OCCUPATIONAL PROFILE AND HISTORY: Detailed assessment: Review of records and additional review of physical, cognitive, psychosocial history related to current functional performance.  CLINICAL DECISION MAKING: Moderate - several treatment options, min-mod task modification necessary  REHAB POTENTIAL: Good  EVALUATION COMPLEXITY: Moderate    PLAN:  OT FREQUENCY: 1x/week  OT DURATION: 12 weeks  PLANNED INTERVENTIONS: 97535 self care/ADL training, 02889 therapeutic exercise, 97530 therapeutic activity, 97112 neuromuscular re-education, 97140 manual therapy, 97018 paraffin, 02989 moist heat, 97760 Orthotic Initial, 97761 Prosthetic Initial, 97763 Orthotic/Prosthetic subsequent, functional mobility training, visual/perceptual remediation/compensation, energy conservation, patient/family education, and DME and/or AE instructions  RECOMMENDED OTHER SERVICES: PT/ST  CONSULTED AND AGREED WITH PLAN OF CARE: Patient  PLAN FOR  NEXT SESSION: Treatment  Inocente Blazing, MS, OTR/L

## 2023-11-28 ENCOUNTER — Encounter

## 2023-11-28 ENCOUNTER — Ambulatory Visit

## 2023-11-28 DIAGNOSIS — R41841 Cognitive communication deficit: Secondary | ICD-10-CM

## 2023-11-28 DIAGNOSIS — M6281 Muscle weakness (generalized): Secondary | ICD-10-CM | POA: Diagnosis not present

## 2023-11-28 DIAGNOSIS — R2689 Other abnormalities of gait and mobility: Secondary | ICD-10-CM

## 2023-11-28 DIAGNOSIS — R2681 Unsteadiness on feet: Secondary | ICD-10-CM

## 2023-11-28 DIAGNOSIS — G931 Anoxic brain damage, not elsewhere classified: Secondary | ICD-10-CM

## 2023-11-28 DIAGNOSIS — R278 Other lack of coordination: Secondary | ICD-10-CM

## 2023-11-28 DIAGNOSIS — R262 Difficulty in walking, not elsewhere classified: Secondary | ICD-10-CM

## 2023-11-28 DIAGNOSIS — R269 Unspecified abnormalities of gait and mobility: Secondary | ICD-10-CM

## 2023-11-28 NOTE — Therapy (Signed)
 OUTPATIENT PHYSICAL THERAPY TREATMENT    Patient Name: Beverly [name] MRN: [id number] DOB:[birth year], 38 y.o., female Today's Date: 11/28/2023  PCP: [facility] REFERRING PROVIDER: Babs Arthea DASEN, MD  END OF SESSION:   PT End of Session - 11/28/23 1410     Visit Number 39    Number of Visits 59    Date for Recertification  02/06/24    Authorization Type UHC Dual Complete; Medicaid secondary    Progress Note Due on Visit 40    PT Start Time 1405    PT Stop Time 1445    PT Time Calculation (min) 40 min    Equipment Utilized During Treatment Gait belt    Activity Tolerance Patient tolerated treatment well    Behavior During Therapy Flat affect;Impulsive;WFL for tasks assessed/performed           Past Medical History:  Diagnosis Date   Brain injury [facility])    CAD (coronary artery disease)    HLD (hyperlipidemia)    Hypertension    last pregnancy   Ischemic cardiomyopathy    STEMI (ST elevation myocardial infarction) (HCC) 09/2019   SCAD with aneurysmal dilation of proximal LAD.   Vaginal Pap smear, abnormal    when she was 38yo   Ventricular fibrillation (HCC) 09/23/2019   Past Surgical History:  Procedure Laterality Date   CARDIAC CATHETERIZATION     IR GASTROSTOMY TUBE MOD SED  10/07/2019   IR GASTROSTOMY TUBE REMOVAL  07/02/2020   IR REPLACE G-TUBE SIMPLE WO FLUORO  09/28/2023   IR REPLC GASTRO/COLONIC TUBE PERCUT W/FLUORO  11/03/2021   IR REPLC GASTRO/COLONIC TUBE PERCUT W/FLUORO  03/09/2023   LEFT HEART CATH AND CORONARY ANGIOGRAPHY N/A 09/23/2019   Procedure: LEFT HEART CATH AND CORONARY ANGIOGRAPHY;  Surgeon: Mady [name], MD;  Location: [facility] INVASIVE CV LAB;  Service: Cardiovascular;  Laterality: N/A;   SUBQ ICD IMPLANT N/A 11/26/2020   Procedure: SUBQ ICD IMPLANT;  Surgeon: [name], MD;  Location: [facility] INVASIVE CV LAB;  Service: Cardiovascular;  Laterality: N/A;   Patient Active Problem List   Diagnosis Date Noted    Apraxia 10/05/2021   S/P ICD (internal cardiac defibrillator) procedure 11/27/2020   Hx of Cardiac arrest (HCC) 11/26/2020   Abnormality of gait 03/16/2020   Sleep disturbance 03/16/2020   Oropharyngeal dysphagia 02/12/2020   Coronary artery disease involving native coronary artery of native heart without angina pectoris 12/12/2019   Ischemic cardiomyopathy 12/12/2019   Brain injury (HCC)    Prediabetes    Anoxic brain injury (HCC) 11/05/2019   Dysphagia 11/05/2019   Physical deconditioning 11/05/2019   Acute delirium 11/05/2019   Respiratory failure (HCC)    Encounter for central line placement    Ventricular fibrillation (HCC) 09/23/2019   Acute combined systolic and diastolic heart failure (HCC) 09/23/2019   Ventricular tachycardia, polymorphic (HCC) 09/23/2019   Pelvic pain affecting pregnancy 10/15/2015   Supervision of normal pregnancy in third trimester 09/28/2015   Poor weight gain of pregnancy 09/28/2015   Iron  deficiency anemia of pregnancy 09/01/2015   Increased BMI (body mass index) 07/29/2015    ONSET DATE: August 2021  REFERRING DIAG:  G93.1 (ICD-10-CM) - Anoxic brain injury (HCC)  R48.2 (ICD-10-CM) - Apraxia    THERAPY DIAG:   Muscle weakness (generalized)  Other lack of coordination  Anoxic brain injury (HCC)  Difficulty in walking, not elsewhere classified  Other abnormalities of gait and mobility  Unsteadiness on feet  Abnormality of gait and mobility  Cognitive communication deficit  Rationale for Evaluation and Treatment: Rehabilitation  SUBJECTIVE:                                                                                                                                                                                             SUBJECTIVE STATEMENT:   Pt reports that she has been doing nothing this morning.  Pt was able to eat with the feeding tube this morning for breakfast, but has not had any food for lunch.  Pt accompanied by:  self and father  PERTINENT HISTORY:   Patient is a 38 year old with diagnois of anoxic brain injury. Pt known to PT clinic. Previously seen for outpatient services March 2024. Pt was receiving HH PT until October 2024 per her mother's report. [name] reports pt using HHA for ambulation, was using RW but she says was told not to do to difficulty with safe technique. Pt has fallen 1x in the past six months trying to ambulate on her own. Pt requires assistance with ADLs, including bathing and dressing. [name] reports pt never indicates any pain. Pt with severe aphasia so is unable to answer questions/pt's mom provides hx. She reports pt has impaired short-term memory, difficulty with stair climbing (has stair lift at home). She reports pt's BLE are weaker than they used to be. The pt is probably limited to <10 min with ambulation due to poor endurance. Pt reports her goal is to be able to use an AD to achieve mod-I mobility rather than relying on someone's HHA to ambulate.She was receiving outpatient PT until Feb. 2022 last year but ran out of visits. Patient has past medical history of Anoxic Brain Injury 09/2019, CAD, HLD, HTN, ISCHEMIC CARDIMYOPATHY, STEMI, V-FIB, ICD on 11/29/2020. Patient is currently living with mom. Mother reports she is abel to walk some with a walker with supervision but primarily ambulates with HHA of mother who is her guardian. Mother also reports she requires assist for all transfers and ADL's.   PAIN:  Are you having pain? No  PRECAUTIONS: Fall  WEIGHT BEARING RESTRICTIONS: No  FALLS: Has patient fallen in last 6 months? Yes. Number of falls 1  LIVING ENVIRONMENT: Lives with: lives with their family, mother Lives in: House/apartment Stairs: has stairs but stair lift installed, pt using lift Has following equipment at home: Environmental consultant - 2 wheeled, Wheelchair (manual), shower chair, and Grab bars, pt uses stair lift  PLOF: Needs assistance with ADLs  PATIENT  GOALS: balance, walking, and pt states basketball  OBJECTIVE:  Note: Objective measures were completed at Evaluation unless otherwise noted.  DIAGNOSTIC FINDINGS:  None recent in chart  COGNITION: Overall cognitive status: impaired   SENSATION: Unable to complete testing due to impaired cognition, pt unable to report if sensation impairment present, pt mother unsure   EDEMA:  Mom reports pt's feet do swell from time to time   LOWER EXTREMITY MMT:  *accuracy of testing likely impacted by pt difficulty following cues  MMT Right Eval Left Eval  Hip flexion 4+ 4+  Hip extension    Hip abduction 4+ 4+  Hip adduction 4+ 4+  Hip internal rotation    Hip external rotation    Knee flexion 4+ 4+  Knee extension 5 5  Ankle dorsiflexion    Ankle plantarflexion    Ankle inversion    Ankle eversion    (Blank rows = not tested)  TRANSFERS: HHA  tried in session, very unsteady, pt has difficulty with motor planning, little success with cues due to distraction, very unsafe Attempted in session with RW, pt unsteady, gait ataxic, continued significant difficulty with motor planning requiring up to min a + 2 and multi-modal cues to safely sit in chair.   FUNCTIONAL TESTS:  5 times sit to stand: 1 min 43 sec Timed up and go (TUG): 1 min 30 sec with RW 10 meter walk test: 0.25 m/s with RW extensive cuing to complete activity  PATIENT SURVEYS:  SIS-16 filled out by pt's mother, used as questions relevant to pt: 45                                                                                                                              TREATMENT DATE: 11/28/2023   Pt arrived to therapy session in transport chair.  Unless otherwise stated, CGA/min A was provided and gait belt donned in order to ensure pt safety throughout session.  TherAct:   STS 2x5, with pt requiring short verbal cues for task as pt is continuously distracted by surrounding people/obstacles  Seated/Standing  bowling attempts with the use of 1kg (2.2# ball), multiple attempts performed to knock over colored pins, therapist assisting with the movement pattern and use of verbal cuing for proper form.  Pt unable to functionally perform  Squats to pick up 1kg ball, x3 total attempts, but with significant difficulty performing grasp while also trying to stand with the ball  Ambulation using RW with theraband around the hips and tied to the walker for tactile feedback, CGA to min A with navigation and pt was much more better with ambulation and stayed closer/within the walker when ambulating. 1 trial of 150 feet  Blocked practice of forward/retro gait with use of the walker to promote pt's ability to transfer to chair with use of the retro gait and RW.  Pt with continued difficulty performing this task   PATIENT EDUCATION: Education details: goal reassessment, indications, exercise technique Person educated: Patient and Parent Education method: Explanation, Demonstration, Tactile cues, and Verbal cues Education comprehension: returned demonstration, verbal cues required, tactile  cues required, and needs further education   HOME EXERCISE PROGRAM: Access Code: [email or web address] URL: [email or web address] Date: 06/11/2023 Prepared by: Massie Dollar  Exercises - Standing March with Counter Support  - 1 x daily - 7 x weekly - 2 sets - 8 reps - Side Stepping with Counter Support  - 1 x daily - 7 x weekly - 2 sets - 4 reps - Sit to Stand with Armchair  - 1 x daily - 7 x weekly - 2 sets - 5 reps - Seated March  - 1 x daily - 7 x weekly - 3 sets - 10 reps  GOALS: Goals reviewed with patient? Yes   SHORT TERM GOALS: Target date: 07/11/2023  Patient will be independent in home exercise program to improve strength/mobility for better functional independence with ADLs. Baseline: Goal status: INITIAL   LONG TERM GOALS: Target date: 02/06/2024  1.  Patient will complete five times sit to stand  test in < 35 seconds indicating an increased LE strength and improved balance. Baseline: previously 37 sec with UUE 04/19/22 today 05/30/23: 1 min 43 seconds; 6/3: 41 sec with improved technique (use of BUE on armrests);  08/21/2023= 38 sec with some distractibility and VC for hand placement 7:23: 39.06 (41.23, 36.89 sec) 10/29/23: 56.85 with HHA; 1 min 16 sec with B UE on armrests 11/14/2023= 32.68 sec with BUE Support (VC for consistency of hand position)  Goal status: PROGRESSING but will keep active to ensure consistency  2. Patient will demo ability to ambulate > 400 ft w/ LRAD versus hand held assist min assist to demonstrate improved household mobility and short community distances.  Baseline: 4/28: 127ft with RW. Mod assist for AD management to prevent veer to the R. 6/3: ~ 300 ft with RW, requires cuing throughout for proximity to RW particularly with turns, but this has improved compared to eval;  08/21/2023= Patient ambulated approx 300 feet with RW- Sill some min assist at times to navigate walker and VC to remain close to walker.  7/23: 347ft with RW and min assist for safety and moderate instruction for safety AD intermittently, especially with fatigue  10/29/23:  Pt able to ambulate 500' with RW, however required modA at times to keep the walker stable 11/14/2023= Will assess next visit Goal status: IN PROGRESS  3.  Patient will increase 10 meter walk test to >1.59m/s as to improve gait speed for better community ambulation and to reduce fall risk. Baseline: previously 0.36 m/s on 04/19/22 : 0.25 m/s;  07/10/23: 0. 24 m/s with RW;  08/15/2023= 0.39 m/s  with RW yet max VC for RW navigation.  7/23:Average Normal speed with RW: 0.25 m/s Average Normal speed with HHA: 0.37m/s 10/29/23: Average Normal speed with RW: 0.23 m/s 11/14/2023= 0.28 m/s with RW (VC for navigation); 0.45 m/s with HHA Goal status: PROGRESSING  4.  Patient will reduce timed up and go (TUG) to <50 seconds to reduce fall risk  and demonstrate improved transfer/gait ability Baseline: previously 91 sec with RW on 04/19/22; 05/30/23: 1 min 30 sec with RW 07/19/2023: 48.265 seconds using RW with skilled min A primarily for AD management with pt having difficulty turning with AD;  08/21/2023- 33 sec with HHA and 1 min 25 sec with RW (Max VC for safety with turning back to seat and to reach back safely)  7/23:42.18sec with RW and min assist for safety in turn; 40.35 sec with HHA and min assist for safety with turns.  10/29/23: TBD 11/14/2023= 43 sec  with RW with difficulty turning 180 at end and max VC to reach back; 31 sec with HHA today. VC for turning.  Goal status: MET  5. Pt will exhibit correct and safe technique with stand>sit into a standard chair or transport chair to reduce risk of falling.   Baseline: pt current technique unsafe, poor motor planning; 6/3: pt with improved technique, bring RW closer to chair prior to sitting; 08/21/2023- Patient able to perform with CGA yet inconsistent with cues for safety and consistency with hand placement.  10/29/23: Pt still with inconsistent STS's, utilizing heavy UE assistance  11/14/2023= Patient continues to be inconsistent but performed around 10 times today and able to reach back and push up using UE appropriately 7/10 times.  Goal status: PARTIALLY MET   6. The pt will demonstrate at least a 15 point improvement on SIS-16 to indicate increased ease with ADLs. Baseline: 45 (filled out by pt's mother); 42; 08/21/2023= 46; 09/03/2023: 46/80 10/29/23: TBD 11/19/2023- Mother not present so will assess next visit she is present. Goal status: ONGOING   ASSESSMENT:  CLINICAL IMPRESSION:   Pt attempted to perform new bowling task, and seemed to enjoy it.  Pt does have some difficulty with releasing of the ball when trying to either throw/roll the ball.  Pt also wanting to stand and then chase the ball when dropped.  Pt continues to mobilize well forward, yet still lacks functional  ability to mobilize retro to sit functionally in a chair.  [name] L LE continues to be the one in which she has difficulty bringing backwards before reaching the walker out front.   Pt will continue to benefit from skilled therapy to address remaining deficits in order to improve overall QoL and return to PLOF.     OBJECTIVE IMPAIRMENTS: Abnormal gait, decreased activity tolerance, decreased balance, decreased coordination, decreased endurance, decreased knowledge of use of DME, decreased mobility, difficulty walking, decreased strength, decreased safety awareness, increased edema, impaired tone, improper body mechanics, and postural dysfunction.   ACTIVITY LIMITATIONS: carrying, lifting, bending, standing, squatting, stairs, transfers, bathing, dressing, locomotion level, and caring for others  PARTICIPATION LIMITATIONS: meal prep, cleaning, laundry, medication management, personal finances, driving, shopping, community activity, occupation, and yard work  PERSONAL FACTORS: Sex, Time since onset of injury/illness/exacerbation, and 1-2 comorbidities: per chart PMH includes CAD, HTN, ischemic cardiomyopathy, STEMI, ventricular fibrillation, dysphagia, s/p ICD, apraxia  are also affecting patient's functional outcome.   REHAB POTENTIAL: Fair    CLINICAL DECISION MAKING: Evolving/moderate complexity  EVALUATION COMPLEXITY: High  PLAN:  PT FREQUENCY: 1-2x/week  PT DURATION: 12 weeks  PLANNED INTERVENTIONS: [id number]- PT Re-evaluation, 97750- Physical Performance Testing, 97110-Therapeutic exercises, 97530- Therapeutic activity, [id number]- Neuromuscular re-education, 97535- Self Care, [id number]- Manual therapy, [id number]- Gait training, [phone]- Orthotic Initial, [phone]- Orthotic/Prosthetic subsequent, [phone]- Canalith repositioning, Patient/Family education, Balance training, Stair training, Taping, Joint mobilization, Spinal mobilization, Vestibular training, DME instructions, Wheelchair mobility training,  Cryotherapy, and Moist heat  PLAN FOR NEXT SESSION:   Progress Report!  backwards gait training using RW with skilled assistance - perform in environment with limited distractions to allow improved motor learning Transfer training - learning sequence of AD management and stepping back to seat Gait training using RW in home-like environment Dynamic balance Reaching with use of Blaze Pods for tapping hands    Fonda Simpers, PT, DPT Physical Therapist - [facility]  11/28/23, 4:28 PM

## 2023-12-01 ENCOUNTER — Encounter: Payer: Self-pay | Admitting: Nurse Practitioner

## 2023-12-01 DIAGNOSIS — E785 Hyperlipidemia, unspecified: Secondary | ICD-10-CM | POA: Insufficient documentation

## 2023-12-01 NOTE — Assessment & Plan Note (Signed)
 Experienced cardiac arrest in August 2021, post-partum, with subsequent implantation of a defibrillator. Under regular cardiology follow-up. - Continue follow-up with cardiology every six months.

## 2023-12-01 NOTE — Assessment & Plan Note (Signed)
 On cholesterol medication for cardiovascular risk management. - Continue cholesterol medication.

## 2023-12-01 NOTE — Assessment & Plan Note (Signed)
 She undergoes rehabilitation services, including physical, occupational, and speech therapy, to improve mobility and cognitive function. -Followed by Dr. Arlana.

## 2023-12-01 NOTE — Assessment & Plan Note (Signed)
 Dependent on PEG tube for nutrition since 2021 due to dysphagia. Requires regular PEG tube changes. - Refer to GI specialist for PEG tube management and regular changes.

## 2023-12-01 NOTE — Assessment & Plan Note (Signed)
 Sustained an anoxic brain injury following cardiac arrest in August 2021, resulting in significant cognitive and physical impairments. Undergoing therapies to improve functional abilities. - Continue physical, occupational, and speech therapy.

## 2023-12-03 ENCOUNTER — Ambulatory Visit

## 2023-12-05 ENCOUNTER — Ambulatory Visit

## 2023-12-05 DIAGNOSIS — M6281 Muscle weakness (generalized): Secondary | ICD-10-CM

## 2023-12-05 DIAGNOSIS — R278 Other lack of coordination: Secondary | ICD-10-CM

## 2023-12-05 DIAGNOSIS — G931 Anoxic brain damage, not elsewhere classified: Secondary | ICD-10-CM

## 2023-12-05 DIAGNOSIS — R41841 Cognitive communication deficit: Secondary | ICD-10-CM

## 2023-12-05 DIAGNOSIS — R2689 Other abnormalities of gait and mobility: Secondary | ICD-10-CM

## 2023-12-05 DIAGNOSIS — R262 Difficulty in walking, not elsewhere classified: Secondary | ICD-10-CM

## 2023-12-05 DIAGNOSIS — R2681 Unsteadiness on feet: Secondary | ICD-10-CM

## 2023-12-05 DIAGNOSIS — R269 Unspecified abnormalities of gait and mobility: Secondary | ICD-10-CM

## 2023-12-05 NOTE — Therapy (Signed)
 OUTPATIENT PHYSICAL THERAPY TREATMENT/PHYSICAL THERAPY PROGRESS NOTE  Dates of reporting period  10/29/23   to   12/05/23   Beverly [name] Name: Beverly [name] MRN: [id number] DOB:April 29, 1985, 38 y.o., female Today's Date: 12/05/2023  PCP: [facility] REFERRING PROVIDER: Babs Arthea DASEN, MD  END OF SESSION:   Beverly [name] End of Session - 12/05/23 1407     Visit Number 40    Number of Visits 59    Date for Recertification  02/06/24    Authorization Type UHC Dual Complete; Medicaid secondary    Progress Note Due on Visit 40    Beverly [name] Start Time 1403    Beverly [name] Stop Time 1445    Beverly [name] Time Calculation (min) 42 min    Equipment Utilized During Treatment Gait belt    Activity Tolerance Beverly [name] tolerated treatment well    Behavior During Therapy Flat affect;Impulsive;WFL for tasks assessed/performed           Past Medical History:  Diagnosis Date   Brain injury [facility])    CAD (coronary artery disease)    HLD (hyperlipidemia)    Hypertension    last pregnancy   Ischemic cardiomyopathy    STEMI (ST elevation myocardial infarction) (HCC) 09/2019   SCAD with aneurysmal dilation of proximal LAD.   Vaginal Pap smear, abnormal    when Beverly [name] was 38yo   Ventricular fibrillation (HCC) 09/23/2019   Past Surgical History:  Procedure Laterality Date   CARDIAC CATHETERIZATION     IR GASTROSTOMY TUBE MOD SED  10/07/2019   IR GASTROSTOMY TUBE REMOVAL  07/02/2020   IR REPLACE G-TUBE SIMPLE WO FLUORO  09/28/2023   IR REPLC GASTRO/COLONIC TUBE PERCUT W/FLUORO  11/03/2021   IR REPLC GASTRO/COLONIC TUBE PERCUT W/FLUORO  03/09/2023   LEFT HEART CATH AND CORONARY ANGIOGRAPHY N/A 09/23/2019   Procedure: LEFT HEART CATH AND CORONARY ANGIOGRAPHY;  Surgeon: Mady [name], MD;  Location: [facility] INVASIVE CV LAB;  Service: Cardiovascular;  Laterality: N/A;   SUBQ ICD IMPLANT N/A 11/26/2020   Procedure: SUBQ ICD IMPLANT;  Surgeon: [name], MD;  Location: [facility] INVASIVE CV LAB;  Service:  Cardiovascular;  Laterality: N/A;   Beverly [name] Active Problem List   Diagnosis Date Noted   Hyperlipidemia 12/01/2023   Apraxia 10/05/2021   S/P ICD (internal cardiac defibrillator) procedure 11/27/2020   Hx of Cardiac arrest (HCC) 11/26/2020   Abnormality of gait 03/16/2020   Sleep disturbance 03/16/2020   Oropharyngeal dysphagia 02/12/2020   Coronary artery disease involving native coronary artery of native heart without angina pectoris 12/12/2019   Ischemic cardiomyopathy 12/12/2019   Brain injury (HCC)    Prediabetes    Anoxic brain injury (HCC) 11/05/2019   Dysphagia 11/05/2019   Physical deconditioning 11/05/2019   Acute delirium 11/05/2019   Respiratory failure (HCC)    Encounter for central line placement    Ventricular fibrillation (HCC) 09/23/2019   Acute combined systolic and diastolic heart failure (HCC) 09/23/2019   Ventricular tachycardia, polymorphic (HCC) 09/23/2019   Pelvic pain affecting pregnancy 10/15/2015   Supervision of normal pregnancy in third trimester 09/28/2015   Poor weight gain of pregnancy 09/28/2015   Iron  deficiency anemia of pregnancy 09/01/2015   Increased BMI (body mass index) 07/29/2015    ONSET DATE: August 2021  REFERRING DIAG:  G93.1 (ICD-10-CM) - Anoxic brain injury (HCC)  R48.2 (ICD-10-CM) - Apraxia    THERAPY DIAG:   Muscle weakness (generalized)  Other lack of coordination  Anoxic brain injury (HCC)  Difficulty in walking, not elsewhere  classified  Other abnormalities of gait and mobility  Unsteadiness on feet  Abnormality of gait and mobility  Cognitive communication deficit  Rationale for Evaluation and Treatment: Rehabilitation  SUBJECTIVE:                                                                                                                                                                                             SUBJECTIVE STATEMENT:   Beverly [name] reports no new complaints.  Beverly [name] states her daughter was real  tired the other day and having some sickness which is why Beverly [name] was unable to come.  Beverly [name] accompanied by: self and father  PERTINENT HISTORY:   Beverly [name] is a 38 year old with diagnois of anoxic brain injury. Beverly [name] known to [name] clinic. Previously seen for outpatient services March 2024. Beverly [name] was receiving HH Beverly [name] until October 2024 per her mother's report. Beverly [name]'s mother reports Beverly [name] using HHA for ambulation, was using RW but Beverly [name] says was told not to do to difficulty with safe technique. Beverly [name] has fallen 1x in the past six months trying to ambulate on her own. Beverly [name] requires assistance with ADLs, including bathing and dressing. Beverly [name]'s mother reports Beverly [name] never indicates any pain. Beverly [name] with severe aphasia so is unable to answer questions/Beverly [name]'s mom provides hx. Beverly [name] reports Beverly [name] has impaired short-term memory, difficulty with stair climbing (has stair lift at home). Beverly [name] reports Beverly [name]'s BLE are weaker than they used to be. The Beverly [name] is probably limited to <10 min with ambulation due to poor endurance. Beverly [name] reports her goal is to be able to use an AD to achieve mod-I mobility rather than relying on someone's HHA to ambulate.Beverly [name] was receiving outpatient Beverly [name] until Feb. 2022 last year but ran out of visits. Beverly [name] has past medical history of Anoxic Brain Injury 09/2019, CAD, HLD, HTN, ISCHEMIC CARDIMYOPATHY, STEMI, V-FIB, ICD on 11/29/2020. Beverly [name] is currently living with mom. Mother reports Beverly [name] is abel to walk some with a walker with supervision but primarily ambulates with HHA of mother who is her guardian. Mother also reports Beverly [name] requires assist for all transfers and ADL's.   PAIN:  Are you having pain? No  PRECAUTIONS: Fall  WEIGHT BEARING RESTRICTIONS: No  FALLS: Has Beverly [name] fallen in last 6 months? Yes. Number of falls 1  LIVING ENVIRONMENT: Lives with: lives with their family, mother Lives in: House/apartment Stairs: has stairs but stair lift installed, Beverly [name] using lift Has following equipment at home: [employer] - 2 wheeled, Wheelchair  (manual), shower chair, and Grab bars, Beverly [name] uses stair lift  PLOF: Needs assistance with ADLs  Beverly [name] GOALS: balance, walking, and Beverly [name] states basketball  OBJECTIVE:  Note: Objective measures were completed at Evaluation unless otherwise noted.  DIAGNOSTIC FINDINGS:  None recent in chart  COGNITION: Overall cognitive status: impaired   SENSATION: Unable to complete testing due to impaired cognition, Beverly [name] unable to report if sensation impairment present, Beverly [name] mother unsure   EDEMA:  Mom reports Beverly [name]'s feet do swell from time to time   LOWER EXTREMITY MMT:  *accuracy of testing likely impacted by Beverly [name] difficulty following cues  MMT Right Eval Left Eval  Hip flexion 4+ 4+  Hip extension    Hip abduction 4+ 4+  Hip adduction 4+ 4+  Hip internal rotation    Hip external rotation    Knee flexion 4+ 4+  Knee extension 5 5  Ankle dorsiflexion    Ankle plantarflexion    Ankle inversion    Ankle eversion    (Blank rows = not tested)  TRANSFERS: HHA  tried in session, very unsteady, Beverly [name] has difficulty with motor planning, little success with cues due to distraction, very unsafe Attempted in session with RW, Beverly [name] unsteady, gait ataxic, continued significant difficulty with motor planning requiring up to min a + 2 and multi-modal cues to safely sit in chair.   FUNCTIONAL TESTS:  5 times sit to stand: 1 min 43 sec Timed up and go (TUG): 1 min 30 sec with RW 10 meter walk test: 0.25 m/s with RW extensive cuing to complete activity  Beverly [name] SURVEYS:  SIS-16 filled out by Beverly [name]'s mother, used as questions relevant to Beverly [name]: 45                                                                                                                              TREATMENT DATE: 12/05/2023   Beverly [name] arrived to therapy session in transport chair.  Unless otherwise stated, CGA/min A was provided and gait belt donned in order to ensure Beverly [name] safety throughout session.  Physical Performance Testing:  Five times Sit  to Stand Test (FTSS)  TIME: 1 min 40 sec with significant distractions requiring verbal cuing to facilitate proper hand placement and walk use for standing upright  Cut off scores indicative of increased fall risk: >12 sec CVA, >16 sec PD, >13 sec vestibular (ANPTA Core Set of Outcome Measures for Adults with Neurologic Conditions, 2018)  10 Meter Walk Test: Beverly [name] instructed to walk 10 meters (32.8 ft) as quickly and as safely as possible at their normal speed Results: 0.18 m/s (53.95 seconds with CGA)  Cut off scores:   Household Ambulator  < 0.4 m/s  Limited Community Ambulator  0.4 - 0.8 m/s  [employer]  > 0.8 m/s  Increased fall risk  < 1.42m/s  Crossing a Street  >1.31m/s  MCID 0.05 m/s (small), 0.13 m/s (moderate), 0.06 m/s (significant)  (ANPTA Core Set of Outcome Measures for Adults with Neurologic Conditions, 2018)      TherAct:   STS 2x5, with Beverly [name] requiring short verbal cues for task  as Beverly [name] is continuously distracted by surrounding people/obstacles  Ambulation with RW, x2 full laps (300') with increasingly more difficult performing without the band placed around her posterior to keep the walker close by.  Beverly [name] ultimately had several instances of distraction and a significant amount of time and cuing was required for the Beverly [name] to ambulate 2 full laps.    Ambulation using RW with theraband around the hips and tied to the walker for tactile feedback, CGA to min A with navigation and Beverly [name] was much more better with ambulation and stayed closer/within the walker when ambulating. 1 trial of 150 feet     Beverly [name] EDUCATION: Education details: goal reassessment, indications, exercise technique Person educated: Beverly [name] and Parent Education method: Explanation, Demonstration, Tactile cues, and Verbal cues Education comprehension: returned demonstration, verbal cues required, tactile cues required, and needs further education   HOME EXERCISE PROGRAM: Access Code:  [email or web address] URL: [email or web address] Date: 06/11/2023 Prepared by: Massie Dollar  Exercises - Standing March with Counter Support  - 1 x daily - 7 x weekly - 2 sets - 8 reps - Side Stepping with Counter Support  - 1 x daily - 7 x weekly - 2 sets - 4 reps - Sit to Stand with Armchair  - 1 x daily - 7 x weekly - 2 sets - 5 reps - Seated March  - 1 x daily - 7 x weekly - 3 sets - 10 reps  GOALS: Goals reviewed with Beverly [name]? Yes   SHORT TERM GOALS: Target date: 07/11/2023  Beverly [name] will be independent in home exercise program to improve strength/mobility for better functional independence with ADLs. Baseline: Goal status: INITIAL   LONG TERM GOALS: Target date: 02/06/2024  1.  Beverly [name] will complete five times sit to stand test in < 35 seconds indicating an increased LE strength and improved balance. Baseline: previously 37 sec with UUE 04/19/22 today 05/30/23: 1 min 43 seconds; 6/3: 41 sec with improved technique (use of BUE on armrests);  08/21/2023= 38 sec with some distractibility and VC for hand placement 7:23: 39.06 (41.23, 36.89 sec) 10/29/23: 56.85 with HHA; 1 min 16 sec with B UE on armrests 11/14/2023= 32.68 sec with BUE Support (VC for consistency of hand position)  12/05/23: 1 min 40 sec with R UE support (VC for proper technique) Goal status: IN PROGRESS  2. Beverly [name] will demo ability to ambulate > 400 ft w/ LRAD versus hand held assist min assist to demonstrate improved household mobility and short community distances.  Baseline: 4/28: 133ft with RW. Mod assist for AD management to prevent veer to the R. 6/3: ~ 300 ft with RW, requires cuing throughout for proximity to RW particularly with turns, but this has improved compared to eval;  08/21/2023= Beverly [name] ambulated approx 300 feet with RW- Sill some min assist at times to navigate walker and VC to remain close to walker.  7/23: 342ft with RW and min assist for safety and moderate instruction for safety AD  intermittently, especially with fatigue  10/29/23:  Beverly [name] able to ambulate 500' with RW, however required modA at times to keep the walker stable 11/14/2023= Will assess next visit 12/05/23: Beverly [name] able to ambulate ~300' with RW and modA from therapist to keep the walker close/stable Goal status: IN PROGRESS  3.  Beverly [name] will increase 10 meter walk test to >1.74m/s as to improve gait speed for better community ambulation and to reduce fall risk. Baseline: previously 0.36 m/s on 04/19/22 : 0.25 m/s;  07/10/23: 0. 24 m/s with  RW;  08/15/2023= 0.39 m/s  with RW yet max VC for RW navigation.  7/23:Average Normal speed with RW: 0.25 m/s Average Normal speed with HHA: 0.82m/s 10/29/23: Average Normal speed with RW: 0.23 m/s 11/14/2023= 0.28 m/s with RW (VC for navigation); 0.45 m/s with HHA 12/05/23: 0.18 m/s; 53.95 sec with RW  Goal status: PROGRESSING  4.  Beverly [name] will reduce timed up and go (TUG) to <50 seconds to reduce fall risk and demonstrate improved transfer/gait ability Baseline: previously 91 sec with RW on 04/19/22; 05/30/23: 1 min 30 sec with RW 07/19/2023: 48.265 seconds using RW with skilled min A primarily for AD management with Beverly [name] having difficulty turning with AD;  08/21/2023- 33 sec with HHA and 1 min 25 sec with RW (Max VC for safety with turning back to seat and to reach back safely)  7/23:42.18sec with RW and min assist for safety in turn; 40.35 sec with HHA and min assist for safety with turns.  10/29/23: TBD 11/14/2023= 43 sec with RW with difficulty turning 180 at end and max VC to reach back; 31 sec with HHA today. VC for turning.  Goal status: MET  5. Beverly [name] will exhibit correct and safe technique with stand>sit into a standard chair or transport chair to reduce risk of falling.   Baseline: Beverly [name] current technique unsafe, poor motor planning; 6/3: Beverly [name] with improved technique, bring RW closer to chair prior to sitting; 08/21/2023- Beverly [name] able to perform with CGA yet inconsistent with cues for safety  and consistency with hand placement.  10/29/23: Beverly [name] still with inconsistent STS's, utilizing heavy UE assistance  11/14/2023= Beverly [name] continues to be inconsistent but performed around 10 times today and able to reach back and push up using UE appropriately 7/10 times.  Goal status: PARTIALLY MET   6. The Beverly [name] will demonstrate at least a 15 point improvement on SIS-16 to indicate increased ease with ADLs. Baseline: 45 (filled out by Beverly [name]'s mother); 42; 08/21/2023= 46; 09/03/2023: 46/80 10/29/23: TBD 11/19/2023- Mother not present so will assess next visit Beverly [name] is present. Goal status: ONGOING   ASSESSMENT:  CLINICAL IMPRESSION:   Beverly [name] has regressed pretty significantly during this progress report as noted in the goal section above.  Beverly [name] had increased difficulty performing ambulation today due to being distracted and not keeping the walker close.  Beverly [name] ultimately has not made improvements in her goal assessment as noted above at this time.  Will continue to monitor symptoms and potentially have discussion with Beverly [name] and mother about lack of progress/plateau.     OBJECTIVE IMPAIRMENTS: Abnormal gait, decreased activity tolerance, decreased balance, decreased coordination, decreased endurance, decreased knowledge of use of DME, decreased mobility, difficulty walking, decreased strength, decreased safety awareness, increased edema, impaired tone, improper body mechanics, and postural dysfunction.   ACTIVITY LIMITATIONS: carrying, lifting, bending, standing, squatting, stairs, transfers, bathing, dressing, locomotion level, and caring for others  PARTICIPATION LIMITATIONS: meal prep, cleaning, laundry, medication management, personal finances, driving, shopping, community activity, occupation, and yard work  PERSONAL FACTORS: Sex, Time since onset of injury/illness/exacerbation, and 1-2 comorbidities: per chart PMH includes CAD, HTN, ischemic cardiomyopathy, STEMI, ventricular fibrillation, dysphagia, s/p ICD,  apraxia  are also affecting Beverly [name]'s functional outcome.   REHAB POTENTIAL: Fair    CLINICAL DECISION MAKING: Evolving/moderate complexity  EVALUATION COMPLEXITY: High  PLAN:  Beverly [name] FREQUENCY: 1-2x/week  Beverly [name] DURATION: 12 weeks  PLANNED INTERVENTIONS: [id number]- Beverly [name] Re-evaluation, 97750- Physical Performance Testing, 97110-Therapeutic exercises, 97530- Therapeutic activity, [id number]- Neuromuscular re-education, 97535- Self Care, [id number]- Manual therapy, [id number]-  Gait training, 02239- Orthotic Initial, [id number]- Orthotic/Prosthetic subsequent, [phone]- Canalith repositioning, Beverly [name]/Family education, Balance training, Stair training, Taping, Joint mobilization, Spinal mobilization, Vestibular training, DME instructions, Wheelchair mobility training, Cryotherapy, and Moist heat  PLAN FOR NEXT SESSION:   Progress Report!  backwards gait training using RW with skilled assistance - perform in environment with limited distractions to allow improved motor learning Transfer training - learning sequence of AD management and stepping back to seat Gait training using RW in home-like environment Dynamic balance Reaching with use of Blaze Pods for tapping hands    Fonda Simpers, Beverly [name], DPT Physical Therapist - [facility]  12/05/23, 3:23 PM

## 2023-12-10 ENCOUNTER — Ambulatory Visit: Attending: Physical Medicine & Rehabilitation

## 2023-12-10 ENCOUNTER — Ambulatory Visit

## 2023-12-10 DIAGNOSIS — R41841 Cognitive communication deficit: Secondary | ICD-10-CM | POA: Diagnosis present

## 2023-12-10 DIAGNOSIS — G931 Anoxic brain damage, not elsewhere classified: Secondary | ICD-10-CM | POA: Diagnosis present

## 2023-12-10 DIAGNOSIS — R2689 Other abnormalities of gait and mobility: Secondary | ICD-10-CM | POA: Insufficient documentation

## 2023-12-10 DIAGNOSIS — R262 Difficulty in walking, not elsewhere classified: Secondary | ICD-10-CM | POA: Diagnosis present

## 2023-12-10 DIAGNOSIS — R2681 Unsteadiness on feet: Secondary | ICD-10-CM | POA: Diagnosis present

## 2023-12-10 DIAGNOSIS — M6281 Muscle weakness (generalized): Secondary | ICD-10-CM | POA: Diagnosis present

## 2023-12-10 DIAGNOSIS — R269 Unspecified abnormalities of gait and mobility: Secondary | ICD-10-CM | POA: Insufficient documentation

## 2023-12-10 DIAGNOSIS — R278 Other lack of coordination: Secondary | ICD-10-CM | POA: Insufficient documentation

## 2023-12-10 NOTE — Therapy (Signed)
 Occupational Therapy Outpatient Neuro Treatment Note  Patient Name: Beverly [name] MRN: [id number] DOB:[birth year], 38 y.o., female Today's Date: 12/10/2023  REFERRING PROVIDER: Babs Hussar, MD  END OF SESSION:  OT End of Session - 12/10/23 1404     Visit Number 16    Number of Visits 22    Date for Recertification  01/07/24    OT Start Time 1315    OT Stop Time 1400    OT Time Calculation (min) 45 min    Equipment Utilized During Treatment transport chair    Activity Tolerance Patient tolerated treatment well    Behavior During Therapy Flat affect;Impulsive;WFL for tasks assessed/performed         Past Medical History:  Diagnosis Date   Brain injury [facility])    CAD (coronary artery disease)    HLD (hyperlipidemia)    Hypertension    last pregnancy   Ischemic cardiomyopathy    STEMI (ST elevation myocardial infarction) (HCC) 09/2019   SCAD with aneurysmal dilation of proximal LAD.   Vaginal Pap smear, abnormal    when she was 38yo   Ventricular fibrillation (HCC) 09/23/2019   Past Surgical History:  Procedure Laterality Date   CARDIAC CATHETERIZATION     IR GASTROSTOMY TUBE MOD SED  10/07/2019   IR GASTROSTOMY TUBE REMOVAL  07/02/2020   IR REPLACE G-TUBE SIMPLE WO FLUORO  09/28/2023   IR REPLC GASTRO/COLONIC TUBE PERCUT W/FLUORO  11/03/2021   IR REPLC GASTRO/COLONIC TUBE PERCUT W/FLUORO  03/09/2023   LEFT HEART CATH AND CORONARY ANGIOGRAPHY N/A 09/23/2019   Procedure: LEFT HEART CATH AND CORONARY ANGIOGRAPHY;  Surgeon: Mady [name], MD;  Location: [facility] INVASIVE CV LAB;  Service: Cardiovascular;  Laterality: N/A;   SUBQ ICD IMPLANT N/A 11/26/2020   Procedure: SUBQ ICD IMPLANT;  Surgeon: [name], MD;  Location: [facility] INVASIVE CV LAB;  Service: Cardiovascular;  Laterality: N/A;   Patient Active Problem List   Diagnosis Date Noted   Hyperlipidemia 12/01/2023   Apraxia 10/05/2021   S/P ICD (internal cardiac defibrillator) procedure 11/27/2020   Hx of  Cardiac arrest (HCC) 11/26/2020   Abnormality of gait 03/16/2020   Sleep disturbance 03/16/2020   Oropharyngeal dysphagia 02/12/2020   Coronary artery disease involving native coronary artery of native heart without angina pectoris 12/12/2019   Ischemic cardiomyopathy 12/12/2019   Brain injury (HCC)    Prediabetes    Anoxic brain injury (HCC) 11/05/2019   Dysphagia 11/05/2019   Physical deconditioning 11/05/2019   Acute delirium 11/05/2019   Respiratory failure (HCC)    Encounter for central line placement    Ventricular fibrillation (HCC) 09/23/2019   Acute combined systolic and diastolic heart failure (HCC) 09/23/2019   Ventricular tachycardia, polymorphic (HCC) 09/23/2019   Pelvic pain affecting pregnancy 10/15/2015   Supervision of normal pregnancy in third trimester 09/28/2015   Poor weight gain of pregnancy 09/28/2015   Iron  deficiency anemia of pregnancy 09/01/2015   Increased BMI (body mass index) 07/29/2015   ONSET DATE: 09/23/19  REFERRING DIAG: Anoxic Brain Injury  THERAPY DIAG:  Muscle weakness (generalized)  Other lack of coordination  Anoxic brain injury (HCC)  Rationale for Evaluation and Treatment: Rehabilitation  SUBJECTIVE:  SUBJECTIVE STATEMENT: Pt nodded head to acknowledge doing well today. Pt accompanied by:  Mother  PERTINENT HISTORY:  Pt. Is a 38 y.o. female with a history of severe aphasia  from a Cardiac Arrest leading to Anoxic Brain Injury on 09/23/19 at 2 weeks postpartum. Pt. Currently hs a G-Tube in place.  PMHx includes: CAD, HLD, HTN, Ischemic cardiomyopathy, STEMI, V-Fib, and ICD on 11/29/20.  PRECAUTIONS: None  WEIGHT BEARING RESTRICTIONS: No  PAIN:  Are you having pain? No (Faces scale)  FALLS: Has patient fallen in last 6 months? No  LIVING ENVIRONMENT: Lives with: mother Lives in: House/apartment Stairs:  two story; No steps to enter. 10 steps inside, and has a chair lift Has following equipment at home: Walker - 2 wheeled  and Wheelchair (manual), stair chair lift  PLOF: Independent  PATIENT GOALS: To improve bilateral hand function for increased engagement in ADLs, and IADL tasks.  OBJECTIVE:  Note: Objective measures were completed at Evaluation unless otherwise noted.  HAND DOMINANCE: Left  ADLs: Per father report  Transfers/ambulation related to ADLs: Eating:  Dependent/feeding tube Grooming: Independent washing face, assist with hair care  UB Dressing: Assist with donning shirt LB Dressing: Assist with donning LE clothing  Toileting: Assist with transfers, toilet hygiene skills, and clothing negotiation Bathing:  Unknown  IADLs: Per father report Shopping: Total A Light housekeeping: Total A Meal Prep: Total A Medication management:   Total A Financial management: Total A Handwriting: 50% legible printing name only using 1 inch letters.  MOBILITY STATUS: Needs Assist: using walker  POSTURE COMMENTS: Sitting balance:  fair  ACTIVITY TOLERANCE: Activity tolerance:  Fair   FUNCTIONAL OUTCOME MEASURES: TBD  UPPER EXTREMITY ROM:    Active ROM Right Eval WFL Left Eval [facility]  Shoulder flexion    Shoulder abduction    Shoulder adduction    Shoulder extension    Shoulder internal rotation    Shoulder external rotation    Elbow flexion    Elbow extension    Wrist flexion    Wrist extension    Wrist ulnar deviation    Wrist radial deviation    Wrist pronation    Wrist supination    (Blank rows = not tested)  UPPER EXTREMITY MMT:     MMT Right eval Right 10/15/23 Left eval Left 10/15/23  Shoulder flexion 3/5 4-/5 3+/5 4-/5  Shoulder abduction 3/5 4-/5 3+/5 4-/5  Shoulder adduction      Shoulder extension      Shoulder internal rotation      Shoulder external rotation      Middle trapezius      Lower trapezius      Elbow flexion 3/5 4-/5 3+/5 4-/5  Elbow extension 3/5 4+/5 3+/5 4-/5  Wrist flexion      Wrist extension 3/5 3+/5 3+/5 4-/5  Wrist ulnar deviation       Wrist radial deviation      Wrist pronation      Wrist supination      (Blank rows = not tested)  HAND FUNCTION: Grip strength: Right: 10 lbs; Left: 18 lbs, Lateral pinch: Right: 5 lbs, Left: 7 lbs.  10/15/23:  Grip strength: Right: 15 lbs; Left: 25 lbs, Lateral pinch: Right: 4 lbs, Left: 11 lbs.   COORDINATION:  Eval:  Nine Hole Peg test: Pt. was able to formulate isolated 2nd digit extension in anticipation for grasping the horizontal pegs.  10/15/2023:   9 Hole Peg Test:   -Pt. was unable to grasp the pegs from a horizontal position, and transition them to a vertical position in preparation for placing them into the pegboard.   -To remove pegs only: Right: 1 min. & 45 sec.;  Left: 1 min. & 16 sec.  SENSATION: Light touch intact  EDEMA: N/A   COGNITION: Overall cognitive status: impaired  VISION:  Baseline:  wears glasses.  VISION ASSESSMENT: To be further assessed in functional context  PERCEPTION: WFL  PRAXIS: WFL                                                                                                       TREATMENT DATE: 12/10/2023 Therapeutic Activity: -Facilitated R/L/B FMC/GMC skills working to [employer] pegs to place and remove from peg board.  Pt practiced moving pegs between R/L hands, and alternated placing and removing pegs between R/L hands.   -Practiced same skills with an added resistance component, working to remove strong magnets from white board, then return to white board within a circular target to challenge GMC.  Pt practiced moving magnets between R/L hands, and alternated placing and removing magnets between R/L hands.   Self Care: -Practiced formulating letters of first name on white board with thick marker.  Max A to set up marker in hand between thumb, IF, and LF, but unable to maintain this prehension; pt used a 5 fingered grasp pattern with pronated palm.  Able to write first 3 letters of first name with assist  to reposition marker after each letter written.  Time constraints limited attempts at remaining letters.  PATIENT EDUCATION: Education details: BUE FMC/GMC training   Person educated: Patient Education method: Explanation and Verbal cues, tactile cues Education comprehension: returned demonstration, verbal cues required, and tactile cues required  HOME EXERCISE PROGRAM:  -increasing opportunities for engaging the bilateral hands during ADL tasks.  GOALS: Goals reviewed with patient? Yes  SHORT TERM GOALS: Target date: 11/26/2023  Pt. Will require supervision HEPs for BUE strengthening  Baseline: Eval: No current HEP. Goal status: INITIAL   LONG TERM GOALS: Target date: 01/07/2024   Pt. Will increase BUE strength by 2 mm grades to assist with ADLs, and IADLs. Baseline: 10/15/23: RUE: Right shoulder flexion:  4-/5, abduction: 4-/5, elbow flexion: 4-/5, elbow extension: 4+/5, wrist extension: 3+/5; LUE: 4-/5 overall Eval:  RUE: 3/5 overall, LUE: 3+/5 overall Goal status: Progressing/Ongoing  2.  Pt. will improve bilateral grip strength by 5# to be able to securely hold items in her hands. Baseline: 10/15/23: Grip Right: 15#, Left: 25# Eval: Grip Right: 10#, Left: 18# Goal status: Progressing/Ongoing  3.  Pt. Will improve bilateral lateral pinch strength by 3# to assist with grooming tasks. Baseline: 10/15/23: lateral pinch strength:  R: 4#, L: 11# Eval: R: 4#, L: 7# Goal status: Progressing/Ongoing  4.  Pt. Will improve bilateral hand FMC grasping, and placing one peg in the 9 hole peg test in preparation for improved manipulation of small objects. Baseline: 10/15/23: 9 Hole Peg Test: Removing pegs only: R: 1 min. & 45 sec.; L: 1 min. &16 sec. Eval: Pt. is able to initiate formulating isolated 2nd digit extension in preparation for grasping small objects. Goal status: Progressing/Ongoing  5.  Pt.  Will write her name her full name with 75% legibility. Baseline: 10/15/23: Continue  Eval: 50% legible printing name only using 1 inch letters. Goal status: Ongoing  6. Pt.  Will demonstrate modified  techniques for ADLs with minA.  Baseline: 10/15/23: requires mod/maxA Eval: Does not currently use adaptive, or modified techniques during ADLS.  Goal status: ongoing  7.  Pt will brush teeth with set up and mod A for thoroughness.  Baseline: 10/15/23: MaxA 08/29/23: Dep  Goal status:Ongoing  8.  Pt will don tshirt with min A.  Baseline: 10/15/23: ModA per mother. 08/29/23: Max-dep; mother reports pt gets her shirt turned around and often struggles to pull over her head  Goal status: Ongoing  9. Pt will spit saliva into cup using cup with handle and wipe mouth with washcloth/hand towel with supv-min A.  Baseline: 10/15/23: Mod-maxA 08/29/23: Max-dep; pt squeezes plastic cup too hard/paper cup slips from hands.  Pt struggles to wipe mouth thoroughly with napkin or tissue.  Goal status: Ongoing   ASSESSMENT:  CLINICAL IMPRESSION: Pt with fairly good participation in bilat FMC/GMC activities this date, requiring increased step-by-step vc/tactile cues to maintain attention to task.  Pt participated in repeat attempts to formulate 2 and 3 point pinch patterns with the R hand, but was unable to sustain these patterns d/t R thumb slipping, causing peg or magnet to drop.  Pt ultimately uses a 5 fingered grasp pattern to hold objects in hand.  Practiced formulating letters of first name on white board on table top with thick marker.  Max A to set up marker in hand to attempt a tripod grasp pattern, but unable to maintain this prehension; pt used a 5 fingered grasp pattern with pronated palm.  Able to write first 3 letters of first name with assist to reposition marker after each letter written.  Time constraints limited attempts at remaining letters.  Pt. continues to benefit from OT services to maximize pt's participation in ADLs, increase ADL safety, and provide caregiver education as needed to  ease caregiver assist with ADLs.   PERFORMANCE DEFICITS: in functional skills including ADLs, IADLs, coordination, dexterity, proprioception, ROM, strength, pain, Fine motor control, and UE functional use, cognitive skills including attention, memory, problem solving, and safety awareness, and psychosocial skills including coping strategies, environmental adaptation, and routines and behaviors.   IMPAIRMENTS: are limiting patient from ADLs, IADLs, and leisure.   CO-MORBIDITIES: may have co-morbidities  that affects occupational performance. Patient will benefit from skilled OT to address above impairments and improve overall function.  MODIFICATION OR ASSISTANCE TO COMPLETE EVALUATION: Min-Moderate modification of tasks or assist with assess necessary to complete an evaluation.  OT OCCUPATIONAL PROFILE AND HISTORY: Detailed assessment: Review of records and additional review of physical, cognitive, psychosocial history related to current functional performance.  CLINICAL DECISION MAKING: Moderate - several treatment options, min-mod task modification necessary  REHAB POTENTIAL: Good  EVALUATION COMPLEXITY: Moderate    PLAN:  OT FREQUENCY: 1x/week  OT DURATION: 12 weeks  PLANNED INTERVENTIONS: 97535 self care/ADL training, 02889 therapeutic exercise, 97530 therapeutic activity, 97112 neuromuscular re-education, 97140 manual therapy, 97018 paraffin, 02989 moist heat, 97760 Orthotic Initial, 97761 Prosthetic Initial, 97763 Orthotic/Prosthetic subsequent, functional mobility training, visual/perceptual remediation/compensation, energy conservation, patient/family education, and DME and/or AE instructions  RECOMMENDED OTHER SERVICES: PT/ST  CONSULTED AND AGREED WITH PLAN OF CARE: Patient  PLAN FOR NEXT SESSION: Treatment  Inocente Blazing, MS, OTR/L

## 2023-12-10 NOTE — Therapy (Signed)
 OUTPATIENT PHYSICAL THERAPY TREATMENT   Patient Name: Beverly [name] MRN: [id number] DOB:[birth year], 38 y.o., female Today's Date: 12/10/2023  PCP: [facility] REFERRING PROVIDER: Babs Arthea DASEN, MD  END OF SESSION:   PT End of Session - 12/10/23 1404     Visit Number 41    Number of Visits 59    Date for Recertification  02/06/24    Authorization Type UHC Dual Complete; Medicaid secondary    Progress Note Due on Visit 40    PT Start Time 1401    PT Stop Time 1445    PT Time Calculation (min) 44 min    Equipment Utilized During Treatment Gait belt    Activity Tolerance Patient tolerated treatment well    Behavior During Therapy Flat affect;Impulsive;WFL for tasks assessed/performed           Past Medical History:  Diagnosis Date   Brain injury [facility]    CAD (coronary artery disease)    HLD (hyperlipidemia)    Hypertension    last pregnancy   Ischemic cardiomyopathy    STEMI (ST elevation myocardial infarction) (HCC) 09/2019   SCAD with aneurysmal dilation of proximal LAD.   Vaginal Pap smear, abnormal    when she was 38yo   Ventricular fibrillation (HCC) 09/23/2019   Past Surgical History:  Procedure Laterality Date   CARDIAC CATHETERIZATION     IR GASTROSTOMY TUBE MOD SED  10/07/2019   IR GASTROSTOMY TUBE REMOVAL  07/02/2020   IR REPLACE G-TUBE SIMPLE WO FLUORO  09/28/2023   IR REPLC GASTRO/COLONIC TUBE PERCUT W/FLUORO  11/03/2021   IR REPLC GASTRO/COLONIC TUBE PERCUT W/FLUORO  03/09/2023   LEFT HEART CATH AND CORONARY ANGIOGRAPHY N/A 09/23/2019   Procedure: LEFT HEART CATH AND CORONARY ANGIOGRAPHY;  Surgeon: Mady [name], MD;  Location: [facility] INVASIVE CV LAB;  Service: Cardiovascular;  Laterality: N/A;   SUBQ ICD IMPLANT N/A 11/26/2020   Procedure: SUBQ ICD IMPLANT;  Surgeon: [name], MD;  Location: [facility] INVASIVE CV LAB;  Service: Cardiovascular;  Laterality: N/A;   Patient Active Problem List   Diagnosis Date Noted    Hyperlipidemia 12/01/2023   Apraxia 10/05/2021   S/P ICD (internal cardiac defibrillator) procedure 11/27/2020   Hx of Cardiac arrest (HCC) 11/26/2020   Abnormality of gait 03/16/2020   Sleep disturbance 03/16/2020   Oropharyngeal dysphagia 02/12/2020   Coronary artery disease involving native coronary artery of native heart without angina pectoris 12/12/2019   Ischemic cardiomyopathy 12/12/2019   Brain injury (HCC)    Prediabetes    Anoxic brain injury (HCC) 11/05/2019   Dysphagia 11/05/2019   Physical deconditioning 11/05/2019   Acute delirium 11/05/2019   Respiratory failure (HCC)    Encounter for central line placement    Ventricular fibrillation (HCC) 09/23/2019   Acute combined systolic and diastolic heart failure (HCC) 09/23/2019   Ventricular tachycardia, polymorphic (HCC) 09/23/2019   Pelvic pain affecting pregnancy 10/15/2015   Supervision of normal pregnancy in third trimester 09/28/2015   Poor weight gain of pregnancy 09/28/2015   Iron  deficiency anemia of pregnancy 09/01/2015   Increased BMI (body mass index) 07/29/2015    ONSET DATE: August 2021  REFERRING DIAG:  G93.1 (ICD-10-CM) - Anoxic brain injury (HCC)  R48.2 (ICD-10-CM) - Apraxia    THERAPY DIAG:   Muscle weakness (generalized)  Other lack of coordination  Anoxic brain injury (HCC)  Difficulty in walking, not elsewhere classified  Other abnormalities of gait and mobility  Unsteadiness on feet  Abnormality of gait  and mobility  Cognitive communication deficit  Rationale for Evaluation and Treatment: Rehabilitation  SUBJECTIVE:                                                                                                                                                                                             SUBJECTIVE STATEMENT:   Pt reports not doing much over the weekend.  Pt reports she gave candy out, but otherwise had uneventful weekend.  Pt accompanied by: self and  father  PERTINENT HISTORY:   Patient is a 38 year old with diagnois of anoxic brain injury. Pt known to PT clinic. Previously seen for outpatient services March 2024. Pt was receiving HH PT until October 2024 per her mother's report. [name] reports pt using HHA for ambulation, was using RW but she says was told not to do to difficulty with safe technique. Pt has fallen 1x in the past six months trying to ambulate on her own. Pt requires assistance with ADLs, including bathing and dressing. [name] reports pt never indicates any pain. Pt with severe aphasia so is unable to answer questions/pt's mom provides hx. She reports pt has impaired short-term memory, difficulty with stair climbing (has stair lift at home). She reports pt's BLE are weaker than they used to be. The pt is probably limited to <10 min with ambulation due to poor endurance. Pt reports her goal is to be able to use an AD to achieve mod-I mobility rather than relying on someone's HHA to ambulate.She was receiving outpatient PT until Feb. 2022 last year but ran out of visits. Patient has past medical history of Anoxic Brain Injury 09/2019, CAD, HLD, HTN, ISCHEMIC CARDIMYOPATHY, STEMI, V-FIB, ICD on 11/29/2020. Patient is currently living with mom. Mother reports she is abel to walk some with a walker with supervision but primarily ambulates with HHA of mother who is her guardian. Mother also reports she requires assist for all transfers and ADL's.   PAIN:  Are you having pain? No  PRECAUTIONS: Fall  WEIGHT BEARING RESTRICTIONS: No  FALLS: Has patient fallen in last 6 months? Yes. Number of falls 1  LIVING ENVIRONMENT: Lives with: lives with their family, mother Lives in: House/apartment Stairs: has stairs but stair lift installed, pt using lift Has following equipment at home: Environmental Consultant - 2 wheeled, Wheelchair (manual), shower chair, and Grab bars, pt uses stair lift  PLOF: Needs assistance with ADLs  PATIENT GOALS:  balance, walking, and pt states basketball  OBJECTIVE:  Note: Objective measures were completed at Evaluation unless otherwise noted.  DIAGNOSTIC FINDINGS:  None recent in chart  COGNITION: Overall cognitive  status: impaired   SENSATION: Unable to complete testing due to impaired cognition, pt unable to report if sensation impairment present, pt mother unsure   EDEMA:  Mom reports pt's feet do swell from time to time   LOWER EXTREMITY MMT:  *accuracy of testing likely impacted by pt difficulty following cues  MMT Right Eval Left Eval  Hip flexion 4+ 4+  Hip extension    Hip abduction 4+ 4+  Hip adduction 4+ 4+  Hip internal rotation    Hip external rotation    Knee flexion 4+ 4+  Knee extension 5 5  Ankle dorsiflexion    Ankle plantarflexion    Ankle inversion    Ankle eversion    (Blank rows = not tested)  TRANSFERS: HHA  tried in session, very unsteady, pt has difficulty with motor planning, little success with cues due to distraction, very unsafe Attempted in session with RW, pt unsteady, gait ataxic, continued significant difficulty with motor planning requiring up to min a + 2 and multi-modal cues to safely sit in chair.   FUNCTIONAL TESTS:  5 times sit to stand: 1 min 43 sec Timed up and go (TUG): 1 min 30 sec with RW 10 meter walk test: 0.25 m/s with RW extensive cuing to complete activity  PATIENT SURVEYS:  SIS-16 filled out by pt's mother, used as questions relevant to pt: 45                                                                                                                              TREATMENT DATE: 12/10/2023   Pt arrived to therapy session in transport chair.  Unless otherwise stated, CGA/min A was provided and gait belt donned in order to ensure pt safety throughout session.   TherAct:   STS 2x5, with pt requiring short verbal cues for task as pt is continuously distracted by surrounding people/obstacles  Ambulation using RW with  theraband around the hips and tied to the walker for tactile feedback, CGA to min A with navigation and pt was much more better with ambulation and stayed closer/within the walker when ambulating.  10# AW donned to each side of the walker for increased resistance and preventing pt from letting the walker get away from pt when ambulating, 1 trial of 150 feet  Neuro:  Activity Description: Seated with 3 pods in front Activity Setting:  The [employer] Focus setting was chosen to enhance cognitive processing and agility, providing an unpredictable environment to simulate real-world scenarios, and fostering quick reactions and adaptability.   Number of Pods:  3 Cycles/Sets:  3 Duration (Time or Hit Count):  15  Activity Description: Ambulation around the clinic to find the 3 lit pods and navigate obstacles along the way Activity Setting:  The Blaze Pod All At Once setting was selected to create a dynamic environment for comprehensive, attempt that challenge users with simultaneous activation of all Pods, fostering agility and enhancing reaction time.  Number of Pods:  3 Cycles/Sets:  2 Duration (Hit Count): 3    PATIENT EDUCATION: Education details: goal reassessment, indications, exercise technique Person educated: Patient and Parent Education method: Explanation, Demonstration, Tactile cues, and Verbal cues Education comprehension: returned demonstration, verbal cues required, tactile cues required, and needs further education   HOME EXERCISE PROGRAM: Access Code: [email or web address] URL: [email or web address] Date: 06/11/2023 Prepared by: Massie Dollar  Exercises - Standing March with Counter Support  - 1 x daily - 7 x weekly - 2 sets - 8 reps - Side Stepping with Counter Support  - 1 x daily - 7 x weekly - 2 sets - 4 reps - Sit to Stand with Armchair  - 1 x daily - 7 x weekly - 2 sets - 5 reps - Seated March  - 1 x daily - 7 x weekly - 3 sets - 10 reps  GOALS: Goals reviewed  with patient? Yes   SHORT TERM GOALS: Target date: 07/11/2023  Patient will be independent in home exercise program to improve strength/mobility for better functional independence with ADLs. Baseline: Goal status: INITIAL   LONG TERM GOALS: Target date: 02/06/2024  1.  Patient will complete five times sit to stand test in < 35 seconds indicating an increased LE strength and improved balance. Baseline: previously 37 sec with UUE 04/19/22 today 05/30/23: 1 min 43 seconds; 6/3: 41 sec with improved technique (use of BUE on armrests);  08/21/2023= 38 sec with some distractibility and VC for hand placement 7:23: 39.06 (41.23, 36.89 sec) 10/29/23: 56.85 with HHA; 1 min 16 sec with B UE on armrests 11/14/2023= 32.68 sec with BUE Support (VC for consistency of hand position)  12/05/23: 1 min 40 sec with R UE support (VC for proper technique) Goal status: IN PROGRESS  2. Patient will demo ability to ambulate > 400 ft w/ LRAD versus hand held assist min assist to demonstrate improved household mobility and short community distances.  Baseline: 4/28: 166ft with RW. Mod assist for AD management to prevent veer to the R. 6/3: ~ 300 ft with RW, requires cuing throughout for proximity to RW particularly with turns, but this has improved compared to eval;  08/21/2023= Patient ambulated approx 300 feet with RW- Sill some min assist at times to navigate walker and VC to remain close to walker.  7/23: 375ft with RW and min assist for safety and moderate instruction for safety AD intermittently, especially with fatigue  10/29/23:  Pt able to ambulate 500' with RW, however required modA at times to keep the walker stable 11/14/2023= Will assess next visit 12/05/23: Pt able to ambulate ~300' with RW and modA from therapist to keep the walker close/stable Goal status: IN PROGRESS  3.  Patient will increase 10 meter walk test to >1.64m/s as to improve gait speed for better community ambulation and to reduce fall  risk. Baseline: previously 0.36 m/s on 04/19/22 : 0.25 m/s;  07/10/23: 0. 24 m/s with RW;  08/15/2023= 0.39 m/s  with RW yet max VC for RW navigation.  7/23:Average Normal speed with RW: 0.25 m/s Average Normal speed with HHA: 0.80m/s 10/29/23: Average Normal speed with RW: 0.23 m/s 11/14/2023= 0.28 m/s with RW (VC for navigation); 0.45 m/s with HHA 12/05/23: 0.18 m/s; 53.95 sec with RW  Goal status: PROGRESSING  4.  Patient will reduce timed up and go (TUG) to <50 seconds to reduce fall risk and demonstrate improved transfer/gait ability Baseline: previously 91 sec with RW on 04/19/22; 05/30/23: 1 min 30  sec with RW 07/19/2023: 48.265 seconds using RW with skilled min A primarily for AD management with pt having difficulty turning with AD;  08/21/2023- 33 sec with HHA and 1 min 25 sec with RW (Max VC for safety with turning back to seat and to reach back safely)  7/23:42.18sec with RW and min assist for safety in turn; 40.35 sec with HHA and min assist for safety with turns.  10/29/23: TBD 11/14/2023= 43 sec with RW with difficulty turning 180 at end and max VC to reach back; 31 sec with HHA today. VC for turning.  Goal status: MET  5. Pt will exhibit correct and safe technique with stand>sit into a standard chair or transport chair to reduce risk of falling.   Baseline: pt current technique unsafe, poor motor planning; 6/3: pt with improved technique, bring RW closer to chair prior to sitting; 08/21/2023- Patient able to perform with CGA yet inconsistent with cues for safety and consistency with hand placement.  10/29/23: Pt still with inconsistent STS's, utilizing heavy UE assistance  11/14/2023= Patient continues to be inconsistent but performed around 10 times today and able to reach back and push up using UE appropriately 7/10 times.  Goal status: PARTIALLY MET   6. The pt will demonstrate at least a 15 point improvement on SIS-16 to indicate increased ease with ADLs. Baseline: 45 (filled out by pt's  mother); 42; 08/21/2023= 46; 09/03/2023: 46/80 10/29/23: TBD 11/19/2023- Mother not present so will assess next visit she is present. Goal status: ONGOING   ASSESSMENT:  CLINICAL IMPRESSION:   Pt was able to ambulate better with the use of the walker and along with better transfers today.  Pt also responded well to having the task of finding the lit blazepods that were scattered throughout the clinic.  Pt had to dodge obstacles and was able to do so safely and no LOB.   Pt will continue to benefit from skilled therapy to address remaining deficits in order to improve overall QoL and return to PLOF.      OBJECTIVE IMPAIRMENTS: Abnormal gait, decreased activity tolerance, decreased balance, decreased coordination, decreased endurance, decreased knowledge of use of DME, decreased mobility, difficulty walking, decreased strength, decreased safety awareness, increased edema, impaired tone, improper body mechanics, and postural dysfunction.   ACTIVITY LIMITATIONS: carrying, lifting, bending, standing, squatting, stairs, transfers, bathing, dressing, locomotion level, and caring for others  PARTICIPATION LIMITATIONS: meal prep, cleaning, laundry, medication management, personal finances, driving, shopping, community activity, occupation, and yard work  PERSONAL FACTORS: Sex, Time since onset of injury/illness/exacerbation, and 1-2 comorbidities: per chart PMH includes CAD, HTN, ischemic cardiomyopathy, STEMI, ventricular fibrillation, dysphagia, s/p ICD, apraxia  are also affecting patient's functional outcome.   REHAB POTENTIAL: Fair    CLINICAL DECISION MAKING: Evolving/moderate complexity  EVALUATION COMPLEXITY: High  PLAN:  PT FREQUENCY: 1-2x/week  PT DURATION: 12 weeks  PLANNED INTERVENTIONS: 97164- PT Re-evaluation, 97750- Physical Performance Testing, 97110-Therapeutic exercises, 97530- Therapeutic activity, 97112- Neuromuscular re-education, 97535- Self Care, [id number]- Manual therapy,  [phone]- Gait training, [phone]- Orthotic Initial, [phone]- Orthotic/Prosthetic subsequent, [phone]- Canalith repositioning, Patient/Family education, Balance training, Stair training, Taping, Joint mobilization, Spinal mobilization, Vestibular training, DME instructions, Wheelchair mobility training, Cryotherapy, and Moist heat  PLAN FOR NEXT SESSION:    backwards gait training using RW with skilled assistance - perform in environment with limited distractions to allow improved motor learning Transfer training - learning sequence of AD management and stepping back to seat Gait training using RW in home-like environment Dynamic balance Reaching  with use of Blaze Pods for tapping hands    Fonda Simpers, PT, DPT Physical Therapist - [facility]  12/10/23, 3:34 PM

## 2023-12-12 ENCOUNTER — Ambulatory Visit

## 2023-12-12 DIAGNOSIS — M6281 Muscle weakness (generalized): Secondary | ICD-10-CM

## 2023-12-12 DIAGNOSIS — G931 Anoxic brain damage, not elsewhere classified: Secondary | ICD-10-CM

## 2023-12-12 DIAGNOSIS — R262 Difficulty in walking, not elsewhere classified: Secondary | ICD-10-CM

## 2023-12-12 DIAGNOSIS — R278 Other lack of coordination: Secondary | ICD-10-CM

## 2023-12-12 DIAGNOSIS — R269 Unspecified abnormalities of gait and mobility: Secondary | ICD-10-CM

## 2023-12-12 DIAGNOSIS — R2689 Other abnormalities of gait and mobility: Secondary | ICD-10-CM

## 2023-12-12 DIAGNOSIS — R41841 Cognitive communication deficit: Secondary | ICD-10-CM

## 2023-12-12 DIAGNOSIS — R2681 Unsteadiness on feet: Secondary | ICD-10-CM

## 2023-12-12 NOTE — Therapy (Unsigned)
 OUTPATIENT PHYSICAL THERAPY TREATMENT   Patient Name: BEVERLYN MCGINNESS MRN: [id number] DOB:12/20/1985, 38 y.o., female Today's Date: 12/13/2023  PCP: [facility] REFERRING PROVIDER: Babs Arthea DASEN, MD  END OF SESSION:   PT End of Session - 12/12/23 1406     Visit Number 42    Number of Visits 59    Date for Recertification  02/06/24    Authorization Type UHC Dual Complete; Medicaid secondary    Progress Note Due on Visit 40    PT Start Time 1406    PT Stop Time 1445    PT Time Calculation (min) 39 min    Equipment Utilized During Treatment Gait belt    Activity Tolerance Patient tolerated treatment well    Behavior During Therapy Flat affect;Impulsive;WFL for tasks assessed/performed           Past Medical History:  Diagnosis Date   Brain injury [facility])    CAD (coronary artery disease)    HLD (hyperlipidemia)    Hypertension    last pregnancy   Ischemic cardiomyopathy    STEMI (ST elevation myocardial infarction) (HCC) 09/2019   SCAD with aneurysmal dilation of proximal LAD.   Vaginal Pap smear, abnormal    when she was 38yo   Ventricular fibrillation (HCC) 09/23/2019   Past Surgical History:  Procedure Laterality Date   CARDIAC CATHETERIZATION     IR GASTROSTOMY TUBE MOD SED  10/07/2019   IR GASTROSTOMY TUBE REMOVAL  07/02/2020   IR REPLACE G-TUBE SIMPLE WO FLUORO  09/28/2023   IR REPLC GASTRO/COLONIC TUBE PERCUT W/FLUORO  11/03/2021   IR REPLC GASTRO/COLONIC TUBE PERCUT W/FLUORO  03/09/2023   LEFT HEART CATH AND CORONARY ANGIOGRAPHY N/A 09/23/2019   Procedure: LEFT HEART CATH AND CORONARY ANGIOGRAPHY;  Surgeon: Mady [name], MD;  Location: [facility] INVASIVE CV LAB;  Service: Cardiovascular;  Laterality: N/A;   SUBQ ICD IMPLANT N/A 11/26/2020   Procedure: SUBQ ICD IMPLANT;  Surgeon: [name], MD;  Location: [facility] INVASIVE CV LAB;  Service: Cardiovascular;  Laterality: N/A;   Patient Active Problem List   Diagnosis Date Noted    Hyperlipidemia 12/01/2023   Apraxia 10/05/2021   S/P ICD (internal cardiac defibrillator) procedure 11/27/2020   Hx of Cardiac arrest (HCC) 11/26/2020   Abnormality of gait 03/16/2020   Sleep disturbance 03/16/2020   Oropharyngeal dysphagia 02/12/2020   Coronary artery disease involving native coronary artery of native heart without angina pectoris 12/12/2019   Ischemic cardiomyopathy 12/12/2019   Brain injury (HCC)    Prediabetes    Anoxic brain injury (HCC) 11/05/2019   Dysphagia 11/05/2019   Physical deconditioning 11/05/2019   Acute delirium 11/05/2019   Respiratory failure (HCC)    Encounter for central line placement    Ventricular fibrillation (HCC) 09/23/2019   Acute combined systolic and diastolic heart failure (HCC) 09/23/2019   Ventricular tachycardia, polymorphic (HCC) 09/23/2019   Pelvic pain affecting pregnancy 10/15/2015   Supervision of normal pregnancy in third trimester 09/28/2015   Poor weight gain of pregnancy 09/28/2015   Iron  deficiency anemia of pregnancy 09/01/2015   Increased BMI (body mass index) 07/29/2015    ONSET DATE: August 2021  REFERRING DIAG:  G93.1 (ICD-10-CM) - Anoxic brain injury (HCC)  R48.2 (ICD-10-CM) - Apraxia    THERAPY DIAG:   Muscle weakness (generalized)  Other lack of coordination  Anoxic brain injury (HCC)  Difficulty in walking, not elsewhere classified  Other abnormalities of gait and mobility  Unsteadiness on feet  Abnormality of gait  and mobility  Cognitive communication deficit  Rationale for Evaluation and Treatment: Rehabilitation  SUBJECTIVE:                                                                                                                                                                                             SUBJECTIVE STATEMENT:   Pt reports no new pain or complaints upon arrival to the clinic.    Pt accompanied by: self and father  PERTINENT HISTORY:   Patient is a 38 year old  with diagnois of anoxic brain injury. Pt known to PT clinic. Previously seen for outpatient services March 2024. Pt was receiving HH PT until October 2024 per her mother's report. [name] reports pt using HHA for ambulation, was using RW but she says was told not to do to difficulty with safe technique. Pt has fallen 1x in the past six months trying to ambulate on her own. Pt requires assistance with ADLs, including bathing and dressing. [name] reports pt never indicates any pain. Pt with severe aphasia so is unable to answer questions/pt's mom provides hx. She reports pt has impaired short-term memory, difficulty with stair climbing (has stair lift at home). She reports pt's BLE are weaker than they used to be. The pt is probably limited to <10 min with ambulation due to poor endurance. Pt reports her goal is to be able to use an AD to achieve mod-I mobility rather than relying on someone's HHA to ambulate.She was receiving outpatient PT until Feb. 2022 last year but ran out of visits. Patient has past medical history of Anoxic Brain Injury 09/2019, CAD, HLD, HTN, ISCHEMIC CARDIMYOPATHY, STEMI, V-FIB, ICD on 11/29/2020. Patient is currently living with mom. Mother reports she is abel to walk some with a walker with supervision but primarily ambulates with HHA of mother who is her guardian. Mother also reports she requires assist for all transfers and ADL's.   PAIN:  Are you having pain? No  PRECAUTIONS: Fall  WEIGHT BEARING RESTRICTIONS: No  FALLS: Has patient fallen in last 6 months? Yes. Number of falls 1  LIVING ENVIRONMENT: Lives with: lives with their family, mother Lives in: House/apartment Stairs: has stairs but stair lift installed, pt using lift Has following equipment at home: Environmental [employer] - 2 wheeled, Wheelchair (manual), shower chair, and Grab bars, pt uses stair lift  PLOF: Needs assistance with ADLs  PATIENT GOALS: balance, walking, and pt states basketball  OBJECTIVE:  Note:  Objective measures were completed at Evaluation unless otherwise noted.  DIAGNOSTIC FINDINGS:  None recent in chart  COGNITION: Overall cognitive status: impaired   SENSATION: Unable  to complete testing due to impaired cognition, pt unable to report if sensation impairment present, pt mother unsure   EDEMA:  Mom reports pt's feet do swell from time to time   LOWER EXTREMITY MMT:  *accuracy of testing likely impacted by pt difficulty following cues  MMT Right Eval Left Eval  Hip flexion 4+ 4+  Hip extension    Hip abduction 4+ 4+  Hip adduction 4+ 4+  Hip internal rotation    Hip external rotation    Knee flexion 4+ 4+  Knee extension 5 5  Ankle dorsiflexion    Ankle plantarflexion    Ankle inversion    Ankle eversion    (Blank rows = not tested)  TRANSFERS: HHA  tried in session, very unsteady, pt has difficulty with motor planning, little success with cues due to distraction, very unsafe Attempted in session with RW, pt unsteady, gait ataxic, continued significant difficulty with motor planning requiring up to min a + 2 and multi-modal cues to safely sit in chair.   FUNCTIONAL TESTS:  5 times sit to stand: 1 min 43 sec Timed up and go (TUG): 1 min 30 sec with RW 10 meter walk test: 0.25 m/s with RW extensive cuing to complete activity  PATIENT SURVEYS:  SIS-16 filled out by pt's mother, used as questions relevant to pt: 45                                                                                                                              TREATMENT DATE: 12/13/2023   Pt arrived to therapy session in transport chair.  Unless otherwise stated, CGA/min A was provided and gait belt donned in order to ensure pt safety throughout session.   TherAct:   STS 2x5, with pt requiring short verbal cues for task as pt is continuously distracted by surrounding people/obstacles  Ambulation using RW with theraband around the hips and tied to the walker for tactile  feedback, CGA to min A with navigation and pt was much more better with ambulation and stayed closer/within the walker when ambulating.  10# AW donned to each side of the walker for increased resistance and preventing pt from letting the walker get away from pt when ambulating, 1 trial of 150 feet  Neuro:  Activity Description: Seated with 3 pods in front Activity Setting:  The [employer] Focus setting was chosen to enhance cognitive processing and agility, providing an unpredictable environment to simulate real-world scenarios, and fostering quick reactions and adaptability.   Number of Pods:  3 Cycles/Sets:  3 Duration (Time or Hit Count):  15  Activity Description: 2 pods on floor, 2 pods held in front of pt, with pt hitting Red lit pods with R UE/LE, and Green lit pods with L UE/LE. Activity Setting:  The [facility] Focus setting was selected to refine precision and concentration, isolating specific muscle groups or movements to enhance overall coordination and targeted muscle engagement. Number  of Pods:  4 Cycles/Sets:  1 Duration (Hit Count): 15   Gait:  Round 1: Ambulation back and forth between two chairs, 16' spaced in between, x4 laps each for blocked practice of transfers/turns without AD, CGA progressing to modA to prevent pt from having a fall.  Round 2: Ambulation back and forth between two chairs, 16' spaced in between, x2 laps each for blocked practice of transfers/turns without AD, CGA progressing to modA to prevent pt from having a fall.  PATIENT EDUCATION: Education details: goal reassessment, indications, exercise technique Person educated: Patient and Parent Education method: Explanation, Demonstration, Tactile cues, and Verbal cues Education comprehension: returned demonstration, verbal cues required, tactile cues required, and needs further education   HOME EXERCISE PROGRAM: Access Code: [email or web address] URL: [email or web address] Date:  06/11/2023 Prepared by: Massie Dollar  Exercises - Standing March with Counter Support  - 1 x daily - 7 x weekly - 2 sets - 8 reps - Side Stepping with Counter Support  - 1 x daily - 7 x weekly - 2 sets - 4 reps - Sit to Stand with Armchair  - 1 x daily - 7 x weekly - 2 sets - 5 reps - Seated March  - 1 x daily - 7 x weekly - 3 sets - 10 reps  GOALS: Goals reviewed with patient? Yes   SHORT TERM GOALS: Target date: 07/11/2023  Patient will be independent in home exercise program to improve strength/mobility for better functional independence with ADLs. Baseline: Goal status: INITIAL   LONG TERM GOALS: Target date: 02/06/2024  1.  Patient will complete five times sit to stand test in < 35 seconds indicating an increased LE strength and improved balance. Baseline: previously 37 sec with UUE 04/19/22 today 05/30/23: 1 min 43 seconds; 6/3: 41 sec with improved technique (use of BUE on armrests);  08/21/2023= 38 sec with some distractibility and VC for hand placement 7:23: 39.06 (41.23, 36.89 sec) 10/29/23: 56.85 with HHA; 1 min 16 sec with B UE on armrests 11/14/2023= 32.68 sec with BUE Support (VC for consistency of hand position)  12/05/23: 1 min 40 sec with R UE support (VC for proper technique) Goal status: IN PROGRESS  2. Patient will demo ability to ambulate > 400 ft w/ LRAD versus hand held assist min assist to demonstrate improved household mobility and short community distances.  Baseline: 4/28: 130ft with RW. Mod assist for AD management to prevent veer to the R. 6/3: ~ 300 ft with RW, requires cuing throughout for proximity to RW particularly with turns, but this has improved compared to eval;  08/21/2023= Patient ambulated approx 300 feet with RW- Sill some min assist at times to navigate walker and VC to remain close to walker.  7/23: 339ft with RW and min assist for safety and moderate instruction for safety AD intermittently, especially with fatigue  10/29/23:  Pt able to  ambulate 500' with RW, however required modA at times to keep the walker stable 11/14/2023= Will assess next visit 12/05/23: Pt able to ambulate ~300' with RW and modA from therapist to keep the walker close/stable Goal status: IN PROGRESS  3.  Patient will increase 10 meter walk test to >1.8m/s as to improve gait speed for better community ambulation and to reduce fall risk. Baseline: previously 0.36 m/s on 04/19/22 : 0.25 m/s;  07/10/23: 0. 24 m/s with RW;  08/15/2023= 0.39 m/s  with RW yet max VC for RW navigation.  7/23:Average Normal speed with RW: 0.25 m/s Average Normal  speed with HHA: 0.69m/s 10/29/23: Average Normal speed with RW: 0.23 m/s 11/14/2023= 0.28 m/s with RW (VC for navigation); 0.45 m/s with HHA 12/05/23: 0.18 m/s; 53.95 sec with RW  Goal status: PROGRESSING  4.  Patient will reduce timed up and go (TUG) to <50 seconds to reduce fall risk and demonstrate improved transfer/gait ability Baseline: previously 91 sec with RW on 04/19/22; 05/30/23: 1 min 30 sec with RW 07/19/2023: 48.265 seconds using RW with skilled min A primarily for AD management with pt having difficulty turning with AD;  08/21/2023- 33 sec with HHA and 1 min 25 sec with RW (Max VC for safety with turning back to seat and to reach back safely)  7/23:42.18sec with RW and min assist for safety in turn; 40.35 sec with HHA and min assist for safety with turns.  10/29/23: TBD 11/14/2023= 43 sec with RW with difficulty turning 180 at end and max VC to reach back; 31 sec with HHA today. VC for turning.  Goal status: MET  5. Pt will exhibit correct and safe technique with stand>sit into a standard chair or transport chair to reduce risk of falling.   Baseline: pt current technique unsafe, poor motor planning; 6/3: pt with improved technique, bring RW closer to chair prior to sitting; 08/21/2023- Patient able to perform with CGA yet inconsistent with cues for safety and consistency with hand placement.  10/29/23: Pt still with  inconsistent STS's, utilizing heavy UE assistance  11/14/2023= Patient continues to be inconsistent but performed around 10 times today and able to reach back and push up using UE appropriately 7/10 times.  Goal status: PARTIALLY MET   6. The pt will demonstrate at least a 15 point improvement on SIS-16 to indicate increased ease with ADLs. Baseline: 45 (filled out by pt's mother); 42; 08/21/2023= 46; 09/03/2023: 46/80 10/29/23: TBD 11/19/2023- Mother not present so will assess next visit she is present. Goal status: ONGOING   ASSESSMENT:  CLINICAL IMPRESSION:   Pt had increased difficulty with the blocked practice attempt and needed modA to prevent her from falling on several attempts.  Pt otherwise performed well and was able to ambulate without the walker with CGA.  [name] continued difficulties are with turns and mobilizing backwards to sit down in the chair.  Pt will continue to benefit from this during subsequent sessions.   Pt will continue to benefit from skilled therapy to address remaining deficits in order to improve overall QoL and return to PLOF.       OBJECTIVE IMPAIRMENTS: Abnormal gait, decreased activity tolerance, decreased balance, decreased coordination, decreased endurance, decreased knowledge of use of DME, decreased mobility, difficulty walking, decreased strength, decreased safety awareness, increased edema, impaired tone, improper body mechanics, and postural dysfunction.   ACTIVITY LIMITATIONS: carrying, lifting, bending, standing, squatting, stairs, transfers, bathing, dressing, locomotion level, and caring for others  PARTICIPATION LIMITATIONS: meal prep, cleaning, laundry, medication management, personal finances, driving, shopping, community activity, occupation, and yard work  PERSONAL FACTORS: Sex, Time since onset of injury/illness/exacerbation, and 1-2 comorbidities: per chart PMH includes CAD, HTN, ischemic cardiomyopathy, STEMI, ventricular fibrillation,  dysphagia, s/p ICD, apraxia  are also affecting patient's functional outcome.   REHAB POTENTIAL: Fair    CLINICAL DECISION MAKING: Evolving/moderate complexity  EVALUATION COMPLEXITY: High  PLAN:  PT FREQUENCY: 1-2x/week  PT DURATION: 12 weeks  PLANNED INTERVENTIONS: 97164- PT Re-evaluation, 97750- Physical Performance Testing, 97110-Therapeutic exercises, 97530- Therapeutic activity, [id number]- Neuromuscular re-education, 97535- Self Care, [id number]- Manual therapy, [id number]- Gait training, [phone]- Orthotic Initial,  02236- Orthotic/Prosthetic subsequent, [phone]- Canalith repositioning, Patient/Family education, Balance training, Stair training, Taping, Joint mobilization, Spinal mobilization, Vestibular training, DME instructions, Wheelchair mobility training, Cryotherapy, and Moist heat  PLAN FOR NEXT SESSION:    backwards gait training using RW with skilled assistance - perform in environment with limited distractions to allow improved motor learning Transfer training - learning sequence of AD management and stepping back to seat Gait training using RW in home-like environment Dynamic balance Reaching with use of Blaze Pods for tapping hands    Fonda Simpers, PT, DPT Physical Therapist - [facility]  12/13/23, 3:33 PM

## 2023-12-13 ENCOUNTER — Ambulatory Visit (INDEPENDENT_AMBULATORY_CARE_PROVIDER_SITE_OTHER): Payer: Medicaid Other

## 2023-12-13 DIAGNOSIS — I4901 Ventricular fibrillation: Secondary | ICD-10-CM

## 2023-12-14 LAB — CUP PACEART REMOTE DEVICE CHECK
Battery Remaining Percentage: 61 %
Battery Voltage: 61
Date Time Interrogation Session: 20251106075100
HighPow Impedance: 145 Ohm
Implantable Pulse Generator Implant Date: 20221021
Pulse Gen Serial Number: 161805

## 2023-12-15 ENCOUNTER — Ambulatory Visit: Payer: Self-pay | Admitting: Cardiology

## 2023-12-17 ENCOUNTER — Ambulatory Visit

## 2023-12-17 DIAGNOSIS — G931 Anoxic brain damage, not elsewhere classified: Secondary | ICD-10-CM

## 2023-12-17 DIAGNOSIS — R2681 Unsteadiness on feet: Secondary | ICD-10-CM

## 2023-12-17 DIAGNOSIS — M6281 Muscle weakness (generalized): Secondary | ICD-10-CM | POA: Diagnosis not present

## 2023-12-17 DIAGNOSIS — R269 Unspecified abnormalities of gait and mobility: Secondary | ICD-10-CM

## 2023-12-17 DIAGNOSIS — R41841 Cognitive communication deficit: Secondary | ICD-10-CM

## 2023-12-17 DIAGNOSIS — R278 Other lack of coordination: Secondary | ICD-10-CM

## 2023-12-17 DIAGNOSIS — R2689 Other abnormalities of gait and mobility: Secondary | ICD-10-CM

## 2023-12-17 DIAGNOSIS — R262 Difficulty in walking, not elsewhere classified: Secondary | ICD-10-CM

## 2023-12-17 NOTE — Therapy (Signed)
 OUTPATIENT PHYSICAL THERAPY TREATMENT   Patient Name: Beverly [name] MRN: [id number] DOB:1985/03/14, 38 y.o., female Today's Date: 12/17/2023  PCP: [facility] REFERRING PROVIDER: Babs Arthea DASEN, MD  END OF SESSION:   PT End of Session - 12/17/23 1405     Visit Number 43    Number of Visits 59    Date for Recertification  02/06/24    Authorization Type UHC Dual Complete; Medicaid secondary    Progress Note Due on Visit 40    PT Start Time 1404    PT Stop Time 1445    PT Time Calculation (min) 41 min    Equipment Utilized During Treatment Gait belt    Activity Tolerance Patient tolerated treatment well    Behavior During Therapy Flat affect;Impulsive;WFL for tasks assessed/performed           Past Medical History:  Diagnosis Date   Brain injury [facility])    CAD (coronary artery disease)    HLD (hyperlipidemia)    Hypertension    last pregnancy   Ischemic cardiomyopathy    STEMI (ST elevation myocardial infarction) (HCC) 09/2019   SCAD with aneurysmal dilation of proximal LAD.   Vaginal Pap smear, abnormal    when she was 38yo   Ventricular fibrillation (HCC) 09/23/2019   Past Surgical History:  Procedure Laterality Date   CARDIAC CATHETERIZATION     IR GASTROSTOMY TUBE MOD SED  10/07/2019   IR GASTROSTOMY TUBE REMOVAL  07/02/2020   IR REPLACE G-TUBE SIMPLE WO FLUORO  09/28/2023   IR REPLC GASTRO/COLONIC TUBE PERCUT W/FLUORO  11/03/2021   IR REPLC GASTRO/COLONIC TUBE PERCUT W/FLUORO  03/09/2023   LEFT HEART CATH AND CORONARY ANGIOGRAPHY N/A 09/23/2019   Procedure: LEFT HEART CATH AND CORONARY ANGIOGRAPHY;  Surgeon: Mady [name], MD;  Location: [facility] INVASIVE CV LAB;  Service: Cardiovascular;  Laterality: N/A;   SUBQ ICD IMPLANT N/A 11/26/2020   Procedure: SUBQ ICD IMPLANT;  Surgeon: [name], MD;  Location: [facility] INVASIVE CV LAB;  Service: Cardiovascular;  Laterality: N/A;   Patient Active Problem List   Diagnosis Date Noted    Hyperlipidemia 12/01/2023   Apraxia 10/05/2021   S/P ICD (internal cardiac defibrillator) procedure 11/27/2020   Hx of Cardiac arrest (HCC) 11/26/2020   Abnormality of gait 03/16/2020   Sleep disturbance 03/16/2020   Oropharyngeal dysphagia 02/12/2020   Coronary artery disease involving native coronary artery of native heart without angina pectoris 12/12/2019   Ischemic cardiomyopathy 12/12/2019   Brain injury (HCC)    Prediabetes    Anoxic brain injury (HCC) 11/05/2019   Dysphagia 11/05/2019   Physical deconditioning 11/05/2019   Acute delirium 11/05/2019   Respiratory failure (HCC)    Encounter for central line placement    Ventricular fibrillation (HCC) 09/23/2019   Acute combined systolic and diastolic heart failure (HCC) 09/23/2019   Ventricular tachycardia, polymorphic (HCC) 09/23/2019   Pelvic pain affecting pregnancy 10/15/2015   Supervision of normal pregnancy in third trimester 09/28/2015   Poor weight gain of pregnancy 09/28/2015   Iron  deficiency anemia of pregnancy 09/01/2015   Increased BMI (body mass index) 07/29/2015    ONSET DATE: August 2021  REFERRING DIAG:  G93.1 (ICD-10-CM) - Anoxic brain injury (HCC)  R48.2 (ICD-10-CM) - Apraxia    THERAPY DIAG:   Muscle weakness (generalized)  Other lack of coordination  Anoxic brain injury (HCC)  Difficulty in walking, not elsewhere classified  Other abnormalities of gait and mobility  Unsteadiness on feet  Abnormality of gait  and mobility  Cognitive communication deficit  Rationale for Evaluation and Treatment: Rehabilitation  SUBJECTIVE:                                                                                                                                                                                             SUBJECTIVE STATEMENT:   Pt denies any new pain upon arrival.  Pt sporting a new hairstyle that she reports her mom did for her.  Pt accompanied by: self and father  PERTINENT  HISTORY:   Patient is a 38 year old with diagnois of anoxic brain injury. Pt known to PT clinic. Previously seen for outpatient services March 2024. Pt was receiving HH PT until October 2024 per her mother's report. [name] reports pt using HHA for ambulation, was using RW but she says was told not to do to difficulty with safe technique. Pt has fallen 1x in the past six months trying to ambulate on her own. Pt requires assistance with ADLs, including bathing and dressing. [name] reports pt never indicates any pain. Pt with severe aphasia so is unable to answer questions/pt's mom provides hx. She reports pt has impaired short-term memory, difficulty with stair climbing (has stair lift at home). She reports pt's BLE are weaker than they used to be. The pt is probably limited to <10 min with ambulation due to poor endurance. Pt reports her goal is to be able to use an AD to achieve mod-I mobility rather than relying on someone's HHA to ambulate.She was receiving outpatient PT until Feb. 2022 last year but ran out of visits. Patient has past medical history of Anoxic Brain Injury 09/2019, CAD, HLD, HTN, ISCHEMIC CARDIMYOPATHY, STEMI, V-FIB, ICD on 11/29/2020. Patient is currently living with mom. Mother reports she is abel to walk some with a walker with supervision but primarily ambulates with HHA of mother who is her guardian. Mother also reports she requires assist for all transfers and ADL's.   PAIN:  Are you having pain? No  PRECAUTIONS: Fall  WEIGHT BEARING RESTRICTIONS: No  FALLS: Has patient fallen in last 6 months? Yes. Number of falls 1  LIVING ENVIRONMENT: Lives with: lives with their family, mother Lives in: House/apartment Stairs: has stairs but stair lift installed, pt using lift Has following equipment at home: Environmental Consultant - 2 wheeled, Wheelchair (manual), shower chair, and Grab bars, pt uses stair lift  PLOF: Needs assistance with ADLs  PATIENT GOALS: balance, walking, and pt  states basketball  OBJECTIVE:  Note: Objective measures were completed at Evaluation unless otherwise noted.  DIAGNOSTIC FINDINGS:  None recent in chart  COGNITION: Overall  cognitive status: impaired   SENSATION: Unable to complete testing due to impaired cognition, pt unable to report if sensation impairment present, pt mother unsure   EDEMA:  Mom reports pt's feet do swell from time to time   LOWER EXTREMITY MMT:  *accuracy of testing likely impacted by pt difficulty following cues  MMT Right Eval Left Eval  Hip flexion 4+ 4+  Hip extension    Hip abduction 4+ 4+  Hip adduction 4+ 4+  Hip internal rotation    Hip external rotation    Knee flexion 4+ 4+  Knee extension 5 5  Ankle dorsiflexion    Ankle plantarflexion    Ankle inversion    Ankle eversion    (Blank rows = not tested)  TRANSFERS: HHA  tried in session, very unsteady, pt has difficulty with motor planning, little success with cues due to distraction, very unsafe Attempted in session with RW, pt unsteady, gait ataxic, continued significant difficulty with motor planning requiring up to min a + 2 and multi-modal cues to safely sit in chair.   FUNCTIONAL TESTS:  5 times sit to stand: 1 min 43 sec Timed up and go (TUG): 1 min 30 sec with RW 10 meter walk test: 0.25 m/s with RW extensive cuing to complete activity  PATIENT SURVEYS:  SIS-16 filled out by pt's mother, used as questions relevant to pt: 45                                                                                                                              TREATMENT DATE: 12/17/2023   Pt arrived to therapy session in transport chair.  Unless otherwise stated, CGA/min A was provided and gait belt donned in order to ensure pt safety throughout session.   TherAct:   STS 2x5, with pt requiring short verbal cues for task as pt is continuously distracted by surrounding people/obstacles  Blocked practice of standing and transferring  another chair approximately 6' in front of her and moving back and forth between the chairs without any UE assistance from therapist or AD.  One chair with arm rests, the other without.  Pt with inconsistent ability to perform throughout the session and extensive time spent performing this task.     Gait:  Ambulation using RW with theraband around the hips and tied to the walker for tactile feedback, CGA to min A with navigation, 2 trials of 150 feet   PATIENT EDUCATION: Education details: goal reassessment, indications, exercise technique Person educated: Patient and Parent Education method: Explanation, Demonstration, Tactile cues, and Verbal cues Education comprehension: returned demonstration, verbal cues required, tactile cues required, and needs further education   HOME EXERCISE PROGRAM: Access Code: [email or web address] URL: [email or web address] Date: 06/11/2023 Prepared by: Massie Dollar  Exercises - Standing March with Counter Support  - 1 x daily - 7 x weekly - 2 sets - 8 reps - Side Stepping with Counter Support  - 1 x daily -  7 x weekly - 2 sets - 4 reps - Sit to Stand with Armchair  - 1 x daily - 7 x weekly - 2 sets - 5 reps - Seated March  - 1 x daily - 7 x weekly - 3 sets - 10 reps  GOALS: Goals reviewed with patient? Yes   SHORT TERM GOALS: Target date: 07/11/2023  Patient will be independent in home exercise program to improve strength/mobility for better functional independence with ADLs. Baseline: Goal status: INITIAL   LONG TERM GOALS: Target date: 02/06/2024  1.  Patient will complete five times sit to stand test in < 35 seconds indicating an increased LE strength and improved balance. Baseline: previously 37 sec with UUE 04/19/22 today 05/30/23: 1 min 43 seconds; 6/3: 41 sec with improved technique (use of BUE on armrests);  08/21/2023= 38 sec with some distractibility and VC for hand placement 7:23: 39.06 (41.23, 36.89 sec) 10/29/23: 56.85 with HHA; 1  min 16 sec with B UE on armrests 11/14/2023= 32.68 sec with BUE Support (VC for consistency of hand position)  12/05/23: 1 min 40 sec with R UE support (VC for proper technique) Goal status: IN PROGRESS  2. Patient will demo ability to ambulate > 400 ft w/ LRAD versus hand held assist min assist to demonstrate improved household mobility and short community distances.  Baseline: 4/28: 155ft with RW. Mod assist for AD management to prevent veer to the R. 6/3: ~ 300 ft with RW, requires cuing throughout for proximity to RW particularly with turns, but this has improved compared to eval;  08/21/2023= Patient ambulated approx 300 feet with RW- Sill some min assist at times to navigate walker and VC to remain close to walker.  7/23: 370ft with RW and min assist for safety and moderate instruction for safety AD intermittently, especially with fatigue  10/29/23:  Pt able to ambulate 500' with RW, however required modA at times to keep the walker stable 11/14/2023= Will assess next visit 12/05/23: Pt able to ambulate ~300' with RW and modA from therapist to keep the walker close/stable Goal status: IN PROGRESS  3.  Patient will increase 10 meter walk test to >1.40m/s as to improve gait speed for better community ambulation and to reduce fall risk. Baseline: previously 0.36 m/s on 04/19/22 : 0.25 m/s;  07/10/23: 0. 24 m/s with RW;  08/15/2023= 0.39 m/s  with RW yet max VC for RW navigation.  7/23:Average Normal speed with RW: 0.25 m/s Average Normal speed with HHA: 0.57m/s 10/29/23: Average Normal speed with RW: 0.23 m/s 11/14/2023= 0.28 m/s with RW (VC for navigation); 0.45 m/s with HHA 12/05/23: 0.18 m/s; 53.95 sec with RW  Goal status: PROGRESSING  4.  Patient will reduce timed up and go (TUG) to <50 seconds to reduce fall risk and demonstrate improved transfer/gait ability Baseline: previously 91 sec with RW on 04/19/22; 05/30/23: 1 min 30 sec with RW 07/19/2023: 48.265 seconds using RW with skilled min A  primarily for AD management with pt having difficulty turning with AD;  08/21/2023- 33 sec with HHA and 1 min 25 sec with RW (Max VC for safety with turning back to seat and to reach back safely)  7/23:42.18sec with RW and min assist for safety in turn; 40.35 sec with HHA and min assist for safety with turns.  10/29/23: TBD 11/14/2023= 43 sec with RW with difficulty turning 180 at end and max VC to reach back; 31 sec with HHA today. VC for turning.  Goal status:  MET  5. Pt will exhibit correct and safe technique with stand>sit into a standard chair or transport chair to reduce risk of falling.   Baseline: pt current technique unsafe, poor motor planning; 6/3: pt with improved technique, bring RW closer to chair prior to sitting; 08/21/2023- Patient able to perform with CGA yet inconsistent with cues for safety and consistency with hand placement.  10/29/23: Pt still with inconsistent STS's, utilizing heavy UE assistance  11/14/2023= Patient continues to be inconsistent but performed around 10 times today and able to reach back and push up using UE appropriately 7/10 times.  Goal status: PARTIALLY MET   6. The pt will demonstrate at least a 15 point improvement on SIS-16 to indicate increased ease with ADLs. Baseline: 45 (filled out by pt's mother); 42; 08/21/2023= 46; 09/03/2023: 46/80 10/29/23: TBD 11/19/2023- Mother not present so will assess next visit she is present. Goal status: ONGOING   ASSESSMENT:  CLINICAL IMPRESSION:   Pt much more distracted today and not following verbal cues as well as normal.  Pt had increased difficulty performing the blocked practice of transfers in the hallway where distractions were minimized.  Pt ultimately continues to have difficulty with turns and transfers to a sitting position without heavy cuing.   Pt will continue to benefit from skilled therapy to address remaining deficits in order to improve overall QoL and return to PLOF.       OBJECTIVE IMPAIRMENTS:  Abnormal gait, decreased activity tolerance, decreased balance, decreased coordination, decreased endurance, decreased knowledge of use of DME, decreased mobility, difficulty walking, decreased strength, decreased safety awareness, increased edema, impaired tone, improper body mechanics, and postural dysfunction.   ACTIVITY LIMITATIONS: carrying, lifting, bending, standing, squatting, stairs, transfers, bathing, dressing, locomotion level, and caring for others  PARTICIPATION LIMITATIONS: meal prep, cleaning, laundry, medication management, personal finances, driving, shopping, community activity, occupation, and yard work  PERSONAL FACTORS: Sex, Time since onset of injury/illness/exacerbation, and 1-2 comorbidities: per chart PMH includes CAD, HTN, ischemic cardiomyopathy, STEMI, ventricular fibrillation, dysphagia, s/p ICD, apraxia  are also affecting patient's functional outcome.   REHAB POTENTIAL: Fair    CLINICAL DECISION MAKING: Evolving/moderate complexity  EVALUATION COMPLEXITY: High  PLAN:  PT FREQUENCY: 1-2x/week  PT DURATION: 12 weeks  PLANNED INTERVENTIONS: [id number]- PT Re-evaluation, 97750- Physical Performance Testing, 97110-Therapeutic exercises, 97530- Therapeutic activity, 97112- Neuromuscular re-education, 97535- Self Care, [id number]- Manual therapy, [phone]- Gait training, [phone]- Orthotic Initial, [phone]- Orthotic/Prosthetic subsequent, [phone]- Canalith repositioning, Patient/Family education, Balance training, Stair training, Taping, Joint mobilization, Spinal mobilization, Vestibular training, DME instructions, Wheelchair mobility training, Cryotherapy, and Moist heat  PLAN FOR NEXT SESSION:    backwards gait training using RW with skilled assistance - perform in environment with limited distractions to allow improved motor learning Transfer training - learning sequence of AD management and stepping back to seat Gait training using RW in home-like environment Dynamic  balance Reaching with use of Blaze Pods for tapping hands    Fonda Simpers, PT, DPT Physical Therapist - [facility]  12/17/23, 9:24 PM

## 2023-12-17 NOTE — Progress Notes (Signed)
 Remote ICD Transmission

## 2023-12-17 NOTE — Therapy (Unsigned)
 Occupational Therapy Outpatient Neuro Treatment Note  Patient Name: Beverly [name] MRN: [id number] DOB:May 28, 1985, 38 y.o., female Today's Date: 12/18/2023  REFERRING PROVIDER: Babs Hussar, MD  END OF SESSION:  OT End of Session - 12/18/23 1624     Visit Number 17    Number of Visits 22    Date for Recertification  01/07/24    OT Start Time 1321    OT Stop Time 1400    OT Time Calculation (min) 39 min    Equipment Utilized During Treatment transport chair    Activity Tolerance Patient tolerated treatment well    Behavior During Therapy Impulsive;WFL for tasks assessed/performed         Past Medical History:  Diagnosis Date   Brain injury (HCC)    CAD (coronary artery disease)    HLD (hyperlipidemia)    Hypertension    last pregnancy   Ischemic cardiomyopathy    STEMI (ST elevation myocardial infarction) (HCC) 09/2019   SCAD with aneurysmal dilation of proximal LAD.   Vaginal Pap smear, abnormal    when she was 38yo   Ventricular fibrillation (HCC) 09/23/2019   Past Surgical History:  Procedure Laterality Date   CARDIAC CATHETERIZATION     IR GASTROSTOMY TUBE MOD SED  10/07/2019   IR GASTROSTOMY TUBE REMOVAL  07/02/2020   IR REPLACE G-TUBE SIMPLE WO FLUORO  09/28/2023   IR REPLC GASTRO/COLONIC TUBE PERCUT W/FLUORO  11/03/2021   IR REPLC GASTRO/COLONIC TUBE PERCUT W/FLUORO  03/09/2023   LEFT HEART CATH AND CORONARY ANGIOGRAPHY N/A 09/23/2019   Procedure: LEFT HEART CATH AND CORONARY ANGIOGRAPHY;  Surgeon: Mady [name], MD;  Location: [facility] INVASIVE CV LAB;  Service: Cardiovascular;  Laterality: N/A;   SUBQ ICD IMPLANT N/A 11/26/2020   Procedure: SUBQ ICD IMPLANT;  Surgeon: [name], MD;  Location: [facility] INVASIVE CV LAB;  Service: Cardiovascular;  Laterality: N/A;   Patient Active Problem List   Diagnosis Date Noted   Hyperlipidemia 12/01/2023   Apraxia 10/05/2021   S/P ICD (internal cardiac defibrillator) procedure 11/27/2020   Hx of Cardiac arrest  (HCC) 11/26/2020   Abnormality of gait 03/16/2020   Sleep disturbance 03/16/2020   Oropharyngeal dysphagia 02/12/2020   Coronary artery disease involving native coronary artery of native heart without angina pectoris 12/12/2019   Ischemic cardiomyopathy 12/12/2019   Brain injury (HCC)    Prediabetes    Anoxic brain injury (HCC) 11/05/2019   Dysphagia 11/05/2019   Physical deconditioning 11/05/2019   Acute delirium 11/05/2019   Respiratory failure (HCC)    Encounter for central line placement    Ventricular fibrillation (HCC) 09/23/2019   Acute combined systolic and diastolic heart failure (HCC) 09/23/2019   Ventricular tachycardia, polymorphic (HCC) 09/23/2019   Pelvic pain affecting pregnancy 10/15/2015   Supervision of normal pregnancy in third trimester 09/28/2015   Poor weight gain of pregnancy 09/28/2015   Iron  deficiency anemia of pregnancy 09/01/2015   Increased BMI (body mass index) 07/29/2015   ONSET DATE: 09/23/19  REFERRING DIAG: Anoxic Brain Injury  THERAPY DIAG:  Muscle weakness (generalized)  Other lack of coordination  Anoxic brain injury (HCC)  Rationale for Evaluation and Treatment: Rehabilitation  SUBJECTIVE:  SUBJECTIVE STATEMENT: Pt acknowledged doing well today. Pt accompanied by:  Mother  PERTINENT HISTORY:  Pt. Is a 38 y.o. female with a history of severe aphasia  from a Cardiac Arrest leading to Anoxic Brain Injury on 09/23/19 at 2 weeks postpartum. Pt. Currently hs a G-Tube in place. PMHx includes: CAD, HLD,  HTN, Ischemic cardiomyopathy, STEMI, V-Fib, and ICD on 11/29/20.  PRECAUTIONS: None  WEIGHT BEARING RESTRICTIONS: No  PAIN:  Are you having pain? No (Faces scale)  FALLS: Has patient fallen in last 6 months? No  LIVING ENVIRONMENT: Lives with: mother Lives in: House/apartment Stairs:  two story; No steps to enter. 10 steps inside, and has a chair lift Has following equipment at home: Walker - 2 wheeled and Wheelchair (manual), stair  chair lift  PLOF: Independent  PATIENT GOALS: To improve bilateral hand function for increased engagement in ADLs, and IADL tasks.  OBJECTIVE:  Note: Objective measures were completed at Evaluation unless otherwise noted.  HAND DOMINANCE: Left  ADLs: Per father report  Transfers/ambulation related to ADLs: Eating:  Dependent/feeding tube Grooming: Independent washing face, assist with hair care  UB Dressing: Assist with donning shirt LB Dressing: Assist with donning LE clothing  Toileting: Assist with transfers, toilet hygiene skills, and clothing negotiation Bathing:  Unknown  IADLs: Per father report Shopping: Total A Light housekeeping: Total A Meal Prep: Total A Medication management:   Total A Financial management: Total A Handwriting: 50% legible printing name only using 1 inch letters.  MOBILITY STATUS: Needs Assist: using walker  POSTURE COMMENTS: Sitting balance:  fair  ACTIVITY TOLERANCE: Activity tolerance:  Fair   FUNCTIONAL OUTCOME MEASURES: TBD  UPPER EXTREMITY ROM:    Active ROM Right Eval WFL Left Eval [facility]  Shoulder flexion    Shoulder abduction    Shoulder adduction    Shoulder extension    Shoulder internal rotation    Shoulder external rotation    Elbow flexion    Elbow extension    Wrist flexion    Wrist extension    Wrist ulnar deviation    Wrist radial deviation    Wrist pronation    Wrist supination    (Blank rows = not tested)  UPPER EXTREMITY MMT:     MMT Right eval Right 10/15/23 Left eval Left 10/15/23  Shoulder flexion 3/5 4-/5 3+/5 4-/5  Shoulder abduction 3/5 4-/5 3+/5 4-/5  Shoulder adduction      Shoulder extension      Shoulder internal rotation      Shoulder external rotation      Middle trapezius      Lower trapezius      Elbow flexion 3/5 4-/5 3+/5 4-/5  Elbow extension 3/5 4+/5 3+/5 4-/5  Wrist flexion      Wrist extension 3/5 3+/5 3+/5 4-/5  Wrist ulnar deviation      Wrist radial deviation       Wrist pronation      Wrist supination      (Blank rows = not tested)  HAND FUNCTION: Grip strength: Right: 10 lbs; Left: 18 lbs, Lateral pinch: Right: 5 lbs, Left: 7 lbs.  10/15/23:  Grip strength: Right: 15 lbs; Left: 25 lbs, Lateral pinch: Right: 4 lbs, Left: 11 lbs.  COORDINATION: Eval:  Nine Hole Peg test: Pt. was able to formulate isolated 2nd digit extension in anticipation for grasping the horizontal pegs.  10/15/2023:   9 Hole Peg Test:   -Pt. was unable to grasp the pegs from a horizontal position, and transition them to a vertical position in preparation for placing them into the pegboard.   -To remove pegs only: Right: 1 min. & 45 sec.;  Left: 1 min. & 16 sec.  SENSATION: Light touch intact  EDEMA: N/A  COGNITION: Overall cognitive status: impaired   VISION:  Baseline:  wears  glasses.  VISION ASSESSMENT: To be further assessed in functional context  PERCEPTION: WFL  PRAXIS: WFL                                                                                                       TREATMENT DATE: 12/17/2023 Therapeutic Activity: -Facilitated R hand grasp/release skills and lateral and 3 point pinch prehension patterns working with 1 velcro blocks and moving blocks between table top and velcro board.    Therapeutic Exercise: -Performed passive and AAROM for R/L shoulder flex/abd/ER behind head/IR behind back, R wrist ext, R hand digit ext and thumb abd, working to increase range of BUEs for BADLs.  Self Care: -UB dressing: max A to doff sweatshirt following max vc/tactile cues for sequencing and grasping strategies with sweatshirt to pull off arms of shirt and overtop of head. Max A to don, pulling overhead, and pulling shirt down behind back and in front, including max vc/tactile cues for sequencing and attention to task. -Facilitated lotion application to hands and face, with vc for bilat hand coordination and extending digits to rub lotion with palm of  hands rather than fingertips.  PATIENT EDUCATION: Education details: ADL training, bilat hand coordination activities    Person educated: Patient Education method: Explanation and Verbal cues, tactile cues Education comprehension: verbal cues required and tactile cues required  HOME EXERCISE PROGRAM:  -increasing opportunities for engaging the bilateral hands during ADL tasks.  GOALS: Goals reviewed with patient? Yes  SHORT TERM GOALS: Target date: 11/26/2023  Pt. Will require supervision HEPs for BUE strengthening  Baseline: Eval: No current HEP. Goal status: INITIAL   LONG TERM GOALS: Target date: 01/07/2024   Pt. Will increase BUE strength by 2 mm grades to assist with ADLs, and IADLs. Baseline: 10/15/23: RUE: Right shoulder flexion:  4-/5, abduction: 4-/5, elbow flexion: 4-/5, elbow extension: 4+/5, wrist extension: 3+/5; LUE: 4-/5 overall Eval:  RUE: 3/5 overall, LUE: 3+/5 overall Goal status: Progressing/Ongoing  2.  Pt. will improve bilateral grip strength by 5# to be able to securely hold items in her hands. Baseline: 10/15/23: Grip Right: 15#, Left: 25# Eval: Grip Right: 10#, Left: 18# Goal status: Progressing/Ongoing  3.  Pt. Will improve bilateral lateral pinch strength by 3# to assist with grooming tasks. Baseline: 10/15/23: lateral pinch strength:  R: 4#, L: 11# Eval: R: 4#, L: 7# Goal status: Progressing/Ongoing  4.  Pt. Will improve bilateral hand FMC grasping, and placing one peg in the 9 hole peg test in preparation for improved manipulation of small objects. Baseline: 10/15/23: 9 Hole Peg Test: Removing pegs only: R: 1 min. & 45 sec.; L: 1 min. &16 sec. Eval: Pt. is able to initiate formulating isolated 2nd digit extension in preparation for grasping small objects. Goal status: Progressing/Ongoing  5.  Pt.  Will write her name her full name with 75% legibility. Baseline: 10/15/23: Continue Eval: 50% legible printing name only using 1 inch letters. Goal status:  Ongoing  6. Pt.  Will demonstrate modified techniques for ADLs with minA.  Baseline: 10/15/23: requires mod/maxA Eval: Does not  currently use adaptive, or modified techniques during ADLS.  Goal status: ongoing  7.  Pt will brush teeth with set up and mod A for thoroughness.  Baseline: 10/15/23: MaxA 08/29/23: Dep  Goal status:Ongoing  8.  Pt will don tshirt with min A.  Baseline: 10/15/23: ModA per mother. 08/29/23: Max-dep; mother reports pt gets her shirt turned around and often struggles to pull over her head  Goal status: Ongoing  9. Pt will spit saliva into cup using cup with handle and wipe mouth with washcloth/hand towel with supv-min A.  Baseline: 10/15/23: Mod-maxA 08/29/23: Max-dep; pt squeezes plastic cup too hard/paper cup slips from hands.  Pt struggles to wipe mouth thoroughly with napkin or tissue.  Goal status: Ongoing   ASSESSMENT:  CLINICAL IMPRESSION: Pt requiring increased cues for attention to task this date.  Focus on R hand grasp/release and maximizing digit extension and thumb abd for easier release of items from hand.  On most trials with releasing blocks, pt required at least tactile cues or min A to initiate digit ext and thumb abd to release block from R hand, with a few reps of releasing with vc and extra time; limited by increased spasticity and decreased motor planning on the R as compared to the L.  Max A for donning/doffing sweatshirt this date, noting increased level of assist with sweatshirt with a shirt underneath as compared to a loose tshirt.  Pt. continues to benefit from OT services to maximize pt's participation in ADLs, increase ADL safety, and provide caregiver education as needed to ease caregiver assist with ADLs.   PERFORMANCE DEFICITS: in functional skills including ADLs, IADLs, coordination, dexterity, proprioception, ROM, strength, pain, Fine motor control, and UE functional use, cognitive skills including attention, memory, problem solving, and  safety awareness, and psychosocial skills including coping strategies, environmental adaptation, and routines and behaviors.   IMPAIRMENTS: are limiting patient from ADLs, IADLs, and leisure.   CO-MORBIDITIES: may have co-morbidities  that affects occupational performance. Patient will benefit from skilled OT to address above impairments and improve overall function.  MODIFICATION OR ASSISTANCE TO COMPLETE EVALUATION: Min-Moderate modification of tasks or assist with assess necessary to complete an evaluation.  OT OCCUPATIONAL PROFILE AND HISTORY: Detailed assessment: Review of records and additional review of physical, cognitive, psychosocial history related to current functional performance.  CLINICAL DECISION MAKING: Moderate - several treatment options, min-mod task modification necessary  REHAB POTENTIAL: Good  EVALUATION COMPLEXITY: Moderate    PLAN:  OT FREQUENCY: 1x/week  OT DURATION: 12 weeks  PLANNED INTERVENTIONS: 97535 self care/ADL training, 02889 therapeutic exercise, 97530 therapeutic activity, 97112 neuromuscular re-education, 97140 manual therapy, 97018 paraffin, 02989 moist heat, 97760 Orthotic Initial, 97761 Prosthetic Initial, 97763 Orthotic/Prosthetic subsequent, functional mobility training, visual/perceptual remediation/compensation, energy conservation, patient/family education, and DME and/or AE instructions  RECOMMENDED OTHER SERVICES: PT/ST  CONSULTED AND AGREED WITH PLAN OF CARE: Patient  PLAN FOR NEXT SESSION: Treatment  Inocente Blazing, MS, OTR/L

## 2023-12-18 ENCOUNTER — Telehealth: Payer: Self-pay | Admitting: Nurse Practitioner

## 2023-12-19 ENCOUNTER — Ambulatory Visit

## 2023-12-19 DIAGNOSIS — M6281 Muscle weakness (generalized): Secondary | ICD-10-CM

## 2023-12-19 DIAGNOSIS — R41841 Cognitive communication deficit: Secondary | ICD-10-CM

## 2023-12-19 DIAGNOSIS — G931 Anoxic brain damage, not elsewhere classified: Secondary | ICD-10-CM

## 2023-12-19 DIAGNOSIS — R269 Unspecified abnormalities of gait and mobility: Secondary | ICD-10-CM

## 2023-12-19 DIAGNOSIS — R2681 Unsteadiness on feet: Secondary | ICD-10-CM

## 2023-12-19 DIAGNOSIS — R2689 Other abnormalities of gait and mobility: Secondary | ICD-10-CM

## 2023-12-19 DIAGNOSIS — R278 Other lack of coordination: Secondary | ICD-10-CM

## 2023-12-19 DIAGNOSIS — R262 Difficulty in walking, not elsewhere classified: Secondary | ICD-10-CM

## 2023-12-19 NOTE — Therapy (Signed)
 OUTPATIENT PHYSICAL THERAPY TREATMENT   Patient Name: Beverly [name] MRN: [id number] DOB:09-Jun-1985, 38 y.o., female Today's Date: 12/19/2023  PCP: [facility] REFERRING PROVIDER: Babs Arthea DASEN, MD  END OF SESSION:   PT End of Session - 12/19/23 1404     Visit Number 44    Number of Visits 59    Date for Recertification  02/06/24    Authorization Type UHC Dual Complete; Medicaid secondary    Progress Note Due on Visit 40    PT Start Time 1404    PT Stop Time 1445    PT Time Calculation (min) 41 min    Equipment Utilized During Treatment Gait belt    Activity Tolerance Patient tolerated treatment well    Behavior During Therapy Flat affect;Impulsive;WFL for tasks assessed/performed            Past Medical History:  Diagnosis Date   Brain injury [name])    CAD (coronary artery disease)    HLD (hyperlipidemia)    Hypertension    last pregnancy   Ischemic cardiomyopathy    STEMI (ST elevation myocardial infarction) (HCC) 09/2019   SCAD with aneurysmal dilation of proximal LAD.   Vaginal Pap smear, abnormal    when she was 38yo   Ventricular fibrillation (HCC) 09/23/2019   Past Surgical History:  Procedure Laterality Date   CARDIAC CATHETERIZATION     IR GASTROSTOMY TUBE MOD SED  10/07/2019   IR GASTROSTOMY TUBE REMOVAL  07/02/2020   IR REPLACE G-TUBE SIMPLE WO FLUORO  09/28/2023   IR REPLC GASTRO/COLONIC TUBE PERCUT W/FLUORO  11/03/2021   IR REPLC GASTRO/COLONIC TUBE PERCUT W/FLUORO  03/09/2023   LEFT HEART CATH AND CORONARY ANGIOGRAPHY N/A 09/23/2019   Procedure: LEFT HEART CATH AND CORONARY ANGIOGRAPHY;  Surgeon: Mady [name], MD;  Location: [facility] INVASIVE CV LAB;  Service: Cardiovascular;  Laterality: N/A;   SUBQ ICD IMPLANT N/A 11/26/2020   Procedure: SUBQ ICD IMPLANT;  Surgeon: [name], MD;  Location: [facility] INVASIVE CV LAB;  Service: Cardiovascular;  Laterality: N/A;   Patient Active Problem List   Diagnosis Date Noted    Hyperlipidemia 12/01/2023   Apraxia 10/05/2021   S/P ICD (internal cardiac defibrillator) procedure 11/27/2020   Hx of Cardiac arrest (HCC) 11/26/2020   Abnormality of gait 03/16/2020   Sleep disturbance 03/16/2020   Oropharyngeal dysphagia 02/12/2020   Coronary artery disease involving native coronary artery of native heart without angina pectoris 12/12/2019   Ischemic cardiomyopathy 12/12/2019   Brain injury (HCC)    Prediabetes    Anoxic brain injury (HCC) 11/05/2019   Dysphagia 11/05/2019   Physical deconditioning 11/05/2019   Acute delirium 11/05/2019   Respiratory failure (HCC)    Encounter for central line placement    Ventricular fibrillation (HCC) 09/23/2019   Acute combined systolic and diastolic heart failure (HCC) 09/23/2019   Ventricular tachycardia, polymorphic (HCC) 09/23/2019   Pelvic pain affecting pregnancy 10/15/2015   Supervision of normal pregnancy in third trimester 09/28/2015   Poor weight gain of pregnancy 09/28/2015   Iron  deficiency anemia of pregnancy 09/01/2015   Increased BMI (body mass index) 07/29/2015    ONSET DATE: August 2021  REFERRING DIAG:  G93.1 (ICD-10-CM) - Anoxic brain injury (HCC)  R48.2 (ICD-10-CM) - Apraxia    THERAPY DIAG:   Muscle weakness (generalized)  Other lack of coordination  Anoxic brain injury (HCC)  Difficulty in walking, not elsewhere classified  Other abnormalities of gait and mobility  Unsteadiness on feet  Abnormality of  gait and mobility  Cognitive communication deficit  Rationale for Evaluation and Treatment: Rehabilitation  SUBJECTIVE:                                                                                                                                                                                             SUBJECTIVE STATEMENT:   Pt denies any complaints upon arrival.  Pt is brought to the clinic by her father.  Pt denies doing anything this morning, but her father has said she has  been much more active.  Pt accompanied by: self and father  PERTINENT HISTORY:   Patient is a 38 year old with diagnois of anoxic brain injury. Pt known to PT clinic. Previously seen for outpatient services March 2024. Pt was receiving HH PT until October 2024 per her mother's report. [name] reports pt using HHA for ambulation, was using RW but she says was told not to do to difficulty with safe technique. Pt has fallen 1x in the past six months trying to ambulate on her own. Pt requires assistance with ADLs, including bathing and dressing. [name] reports pt never indicates any pain. Pt with severe aphasia so is unable to answer questions/pt's mom provides hx. She reports pt has impaired short-term memory, difficulty with stair climbing (has stair lift at home). She reports pt's BLE are weaker than they used to be. The pt is probably limited to <10 min with ambulation due to poor endurance. Pt reports her goal is to be able to use an AD to achieve mod-I mobility rather than relying on someone's HHA to ambulate.She was receiving outpatient PT until Feb. 2022 last year but ran out of visits. Patient has past medical history of Anoxic Brain Injury 09/2019, CAD, HLD, HTN, ISCHEMIC CARDIMYOPATHY, STEMI, V-FIB, ICD on 11/29/2020. Patient is currently living with mom. Mother reports she is abel to walk some with a walker with supervision but primarily ambulates with HHA of mother who is her guardian. Mother also reports she requires assist for all transfers and ADL's.   PAIN:  Are you having pain? No  PRECAUTIONS: Fall  WEIGHT BEARING RESTRICTIONS: No  FALLS: Has patient fallen in last 6 months? Yes. Number of falls 1  LIVING ENVIRONMENT: Lives with: lives with their family, mother Lives in: House/apartment Stairs: has stairs but stair lift installed, pt using lift Has following equipment at home: Environmental [employer]ant - 2 wheeled, Wheelchair (manual), shower chair, and Grab bars, pt uses stair  lift  PLOF: Needs assistance with ADLs  PATIENT GOALS: balance, walking, and pt states basketball  OBJECTIVE:  Note: Objective measures were completed at Evaluation  unless otherwise noted.  DIAGNOSTIC FINDINGS:  None recent in chart  COGNITION: Overall cognitive status: impaired   SENSATION: Unable to complete testing due to impaired cognition, pt unable to report if sensation impairment present, pt mother unsure   EDEMA:  Mom reports pt's feet do swell from time to time   LOWER EXTREMITY MMT:  *accuracy of testing likely impacted by pt difficulty following cues  MMT Right Eval Left Eval  Hip flexion 4+ 4+  Hip extension    Hip abduction 4+ 4+  Hip adduction 4+ 4+  Hip internal rotation    Hip external rotation    Knee flexion 4+ 4+  Knee extension 5 5  Ankle dorsiflexion    Ankle plantarflexion    Ankle inversion    Ankle eversion    (Blank rows = not tested)  TRANSFERS: HHA  tried in session, very unsteady, pt has difficulty with motor planning, little success with cues due to distraction, very unsafe Attempted in session with RW, pt unsteady, gait ataxic, continued significant difficulty with motor planning requiring up to min a + 2 and multi-modal cues to safely sit in chair.   FUNCTIONAL TESTS:  5 times sit to stand: 1 min 43 sec Timed up and go (TUG): 1 min 30 sec with RW 10 meter walk test: 0.25 m/s with RW extensive cuing to complete activity  PATIENT SURVEYS:  SIS-16 filled out by pt's mother, used as questions relevant to pt: 45                                                                                                                              TREATMENT DATE: 12/19/2023   Pt arrived to therapy session in transport chair.  Unless otherwise stated, CGA/min A was provided and gait belt donned in order to ensure pt safety throughout session.   TherAct:   STS 2x5, with pt requiring short verbal cues for task as pt is continuously distracted  by surrounding people/obstacles  Transfer training from transport chair to green chair, to a green chair placed ~12' away and back.  Pt with intermittent success throughout the session    Gait:  Donned LiteGait harness.    Gait training on treadmill; the following trials completed in litegait harness for safety, but not providing BWS (body weight support), with therapist cuing for body placement and keeping up with treadmill speed.   Round 1: B UE supported, pt ambulating at 0.7 mph, and able to achieve 2 minutes before fatiguing   Round 2: B UE supported, pt ambulating at 0.7 mph, and able to achieve 2.5 minutes before fatiguing   Round 3: B UE supported, pt ambulating at 0.7 mph, and able to achieve 1.5 minutes before fatiguing  Litegait support with +2 assistance for safety and monitoring.  Pt noted to have foot clearance complications on the L LE > R LE.  Pt unable to voice a BORG scale after the session.  PATIENT EDUCATION: Education details: goal reassessment, indications, exercise technique Person educated: Patient and Parent Education method: Explanation, Demonstration, Tactile cues, and Verbal cues Education comprehension: returned demonstration, verbal cues required, tactile cues required, and needs further education   HOME EXERCISE PROGRAM: Access Code: [email or web address] URL: [email or web address] Date: 06/11/2023 Prepared by: Massie Dollar  Exercises - Standing March with Counter Support  - 1 x daily - 7 x weekly - 2 sets - 8 reps - Side Stepping with Counter Support  - 1 x daily - 7 x weekly - 2 sets - 4 reps - Sit to Stand with Armchair  - 1 x daily - 7 x weekly - 2 sets - 5 reps - Seated March  - 1 x daily - 7 x weekly - 3 sets - 10 reps  GOALS: Goals reviewed with patient? Yes   SHORT TERM GOALS: Target date: 07/11/2023  Patient will be independent in home exercise program to improve strength/mobility for better functional independence with  ADLs. Baseline: Goal status: INITIAL   LONG TERM GOALS: Target date: 02/06/2024  1.  Patient will complete five times sit to stand test in < 35 seconds indicating an increased LE strength and improved balance. Baseline: previously 37 sec with UUE 04/19/22 today 05/30/23: 1 min 43 seconds; 6/3: 41 sec with improved technique (use of BUE on armrests);  08/21/2023= 38 sec with some distractibility and VC for hand placement 7:23: 39.06 (41.23, 36.89 sec) 10/29/23: 56.85 with HHA; 1 min 16 sec with B UE on armrests 11/14/2023= 32.68 sec with BUE Support (VC for consistency of hand position)  12/05/23: 1 min 40 sec with R UE support (VC for proper technique) Goal status: IN PROGRESS  2. Patient will demo ability to ambulate > 400 ft w/ LRAD versus hand held assist min assist to demonstrate improved household mobility and short community distances.  Baseline: 4/28: 152ft with RW. Mod assist for AD management to prevent veer to the R. 6/3: ~ 300 ft with RW, requires cuing throughout for proximity to RW particularly with turns, but this has improved compared to eval;  08/21/2023= Patient ambulated approx 300 feet with RW- Sill some min assist at times to navigate walker and VC to remain close to walker.  7/23: 334ft with RW and min assist for safety and moderate instruction for safety AD intermittently, especially with fatigue  10/29/23:  Pt able to ambulate 500' with RW, however required modA at times to keep the walker stable 11/14/2023= Will assess next visit 12/05/23: Pt able to ambulate ~300' with RW and modA from therapist to keep the walker close/stable Goal status: IN PROGRESS  3.  Patient will increase 10 meter walk test to >1.74m/s as to improve gait speed for better community ambulation and to reduce fall risk. Baseline: previously 0.36 m/s on 04/19/22 : 0.25 m/s;  07/10/23: 0. 24 m/s with RW;  08/15/2023= 0.39 m/s  with RW yet max VC for RW navigation.  7/23:Average Normal speed with RW: 0.25  m/s Average Normal speed with HHA: 0.37m/s 10/29/23: Average Normal speed with RW: 0.23 m/s 11/14/2023= 0.28 m/s with RW (VC for navigation); 0.45 m/s with HHA 12/05/23: 0.18 m/s; 53.95 sec with RW  Goal status: PROGRESSING  4.  Patient will reduce timed up and go (TUG) to <50 seconds to reduce fall risk and demonstrate improved transfer/gait ability Baseline: previously 91 sec with RW on 04/19/22; 05/30/23: 1 min 30 sec with RW 07/19/2023: 48.265 seconds using RW with skilled min A primarily for AD  management with pt having difficulty turning with AD;  08/21/2023- 33 sec with HHA and 1 min 25 sec with RW (Max VC for safety with turning back to seat and to reach back safely)  7/23:42.18sec with RW and min assist for safety in turn; 40.35 sec with HHA and min assist for safety with turns.  10/29/23: TBD 11/14/2023= 43 sec with RW with difficulty turning 180 at end and max VC to reach back; 31 sec with HHA today. VC for turning.  Goal status: MET  5. Pt will exhibit correct and safe technique with stand>sit into a standard chair or transport chair to reduce risk of falling.   Baseline: pt current technique unsafe, poor motor planning; 6/3: pt with improved technique, bring RW closer to chair prior to sitting; 08/21/2023- Patient able to perform with CGA yet inconsistent with cues for safety and consistency with hand placement.  10/29/23: Pt still with inconsistent STS's, utilizing heavy UE assistance  11/14/2023= Patient continues to be inconsistent but performed around 10 times today and able to reach back and push up using UE appropriately 7/10 times.  Goal status: PARTIALLY MET   6. The pt will demonstrate at least a 15 point improvement on SIS-16 to indicate increased ease with ADLs. Baseline: 45 (filled out by pt's mother); 42; 08/21/2023= 46; 09/03/2023: 46/80 10/29/23: TBD 11/19/2023- Mother not present so will assess next visit she is present. Goal status: ONGOING   ASSESSMENT:  CLINICAL  IMPRESSION:   Pt responded well to the exercises and benefited from the safety of the LiteGait harness during the session today.  Pt noted to be much more stable when performing the transfer from the chair to the treadmill, however the treadmill added the requirement for the pt to continue ambulation and be more focused on walking rather than external factors.  Pt may benefit from continued LiteGait training in future sessions to promote increased tolerance to walking longer distances.   Pt will continue to benefit from skilled therapy to address remaining deficits in order to improve overall QoL and return to PLOF.        OBJECTIVE IMPAIRMENTS: Abnormal gait, decreased activity tolerance, decreased balance, decreased coordination, decreased endurance, decreased knowledge of use of DME, decreased mobility, difficulty walking, decreased strength, decreased safety awareness, increased edema, impaired tone, improper body mechanics, and postural dysfunction.   ACTIVITY LIMITATIONS: carrying, lifting, bending, standing, squatting, stairs, transfers, bathing, dressing, locomotion level, and caring for others  PARTICIPATION LIMITATIONS: meal prep, cleaning, laundry, medication management, personal finances, driving, shopping, community activity, occupation, and yard work  PERSONAL FACTORS: Sex, Time since onset of injury/illness/exacerbation, and 1-2 comorbidities: per chart PMH includes CAD, HTN, ischemic cardiomyopathy, STEMI, ventricular fibrillation, dysphagia, s/p ICD, apraxia  are also affecting patient's functional outcome.   REHAB POTENTIAL: Fair    CLINICAL DECISION MAKING: Evolving/moderate complexity  EVALUATION COMPLEXITY: High  PLAN:  PT FREQUENCY: 1-2x/week  PT DURATION: 12 weeks  PLANNED INTERVENTIONS: 97164- PT Re-evaluation, 97750- Physical Performance Testing, 97110-Therapeutic exercises, 97530- Therapeutic activity, [id number]- Neuromuscular re-education, 97535- Self Care, [id number]-  Manual therapy, [phone]- Gait training, [phone]- Orthotic Initial, [phone]- Orthotic/Prosthetic subsequent, [phone]- Canalith repositioning, Patient/Family education, Balance training, Stair training, Taping, Joint mobilization, Spinal mobilization, Vestibular training, DME instructions, Wheelchair mobility training, Cryotherapy, and Moist heat  PLAN FOR NEXT SESSION:   backwards gait training using RW with skilled assistance - perform in environment with limited distractions to allow improved motor learning Transfer training - learning sequence of AD management and stepping back to seat  Gait training using RW in home-like environment Dynamic balance Reaching with use of Blaze Pods for tapping hands    Fonda Simpers, PT, DPT Physical Therapist - [facility]  12/19/23, 2:07 PM

## 2023-12-19 NOTE — Telephone Encounter (Signed)
 Patient's mother states she uses the purewick and does need supplies. Mother Myrick also states that she will not be able to get nutrition through [facility] so she will also need nutrition. Myrick states if you need to speak to her you can call her anytime.

## 2023-12-20 NOTE — Telephone Encounter (Signed)
 Called mother, she states that Heba has  incontinence at night, during the day time she has no incontinence episodes. Has been using purewick at night.  Paperwork completed and ready to fax.

## 2023-12-21 NOTE — Telephone Encounter (Signed)
 Faxed paperwork to [facility] and received an ok confirmation.

## 2023-12-24 ENCOUNTER — Ambulatory Visit

## 2023-12-24 DIAGNOSIS — G931 Anoxic brain damage, not elsewhere classified: Secondary | ICD-10-CM

## 2023-12-24 DIAGNOSIS — R269 Unspecified abnormalities of gait and mobility: Secondary | ICD-10-CM

## 2023-12-24 DIAGNOSIS — R2689 Other abnormalities of gait and mobility: Secondary | ICD-10-CM

## 2023-12-24 DIAGNOSIS — R278 Other lack of coordination: Secondary | ICD-10-CM

## 2023-12-24 DIAGNOSIS — R2681 Unsteadiness on feet: Secondary | ICD-10-CM

## 2023-12-24 DIAGNOSIS — R262 Difficulty in walking, not elsewhere classified: Secondary | ICD-10-CM

## 2023-12-24 DIAGNOSIS — M6281 Muscle weakness (generalized): Secondary | ICD-10-CM | POA: Diagnosis not present

## 2023-12-24 DIAGNOSIS — R41841 Cognitive communication deficit: Secondary | ICD-10-CM

## 2023-12-24 NOTE — Therapy (Unsigned)
 Occupational Therapy Outpatient Neuro Treatment Note  Patient Name: Beverly [name] MRN: [id number] DOB:Jun 15, 1985, 38 y.o., female Today's Date: 12/24/2023  REFERRING PROVIDER: Babs Hussar, MD  END OF SESSION:   Past Medical History:  Diagnosis Date   Brain injury [facility])    CAD (coronary artery disease)    HLD (hyperlipidemia)    Hypertension    last pregnancy   Ischemic cardiomyopathy    STEMI (ST elevation myocardial infarction) (HCC) 09/2019   SCAD with aneurysmal dilation of proximal LAD.   Vaginal Pap smear, abnormal    when she was 38yo   Ventricular fibrillation (HCC) 09/23/2019   Past Surgical History:  Procedure Laterality Date   CARDIAC CATHETERIZATION     IR GASTROSTOMY TUBE MOD SED  10/07/2019   IR GASTROSTOMY TUBE REMOVAL  07/02/2020   IR REPLACE G-TUBE SIMPLE WO FLUORO  09/28/2023   IR REPLC GASTRO/COLONIC TUBE PERCUT W/FLUORO  11/03/2021   IR REPLC GASTRO/COLONIC TUBE PERCUT W/FLUORO  03/09/2023   LEFT HEART CATH AND CORONARY ANGIOGRAPHY N/A 09/23/2019   Procedure: LEFT HEART CATH AND CORONARY ANGIOGRAPHY;  Surgeon: Mady [name], MD;  Location: [facility] INVASIVE CV LAB;  Service: Cardiovascular;  Laterality: N/A;   SUBQ ICD IMPLANT N/A 11/26/2020   Procedure: SUBQ ICD IMPLANT;  Surgeon: [name], MD;  Location: [facility] INVASIVE CV LAB;  Service: Cardiovascular;  Laterality: N/A;   Patient Active Problem List   Diagnosis Date Noted   Hyperlipidemia 12/01/2023   Apraxia 10/05/2021   S/P ICD (internal cardiac defibrillator) procedure 11/27/2020   Hx of Cardiac arrest (HCC) 11/26/2020   Abnormality of gait 03/16/2020   Sleep disturbance 03/16/2020   Oropharyngeal dysphagia 02/12/2020   Coronary artery disease involving native coronary artery of native heart without angina pectoris 12/12/2019   Ischemic cardiomyopathy 12/12/2019   Brain injury (HCC)    Prediabetes    Anoxic brain injury (HCC) 11/05/2019   Dysphagia 11/05/2019   Physical  deconditioning 11/05/2019   Acute delirium 11/05/2019   Respiratory failure (HCC)    Encounter for central line placement    Ventricular fibrillation (HCC) 09/23/2019   Acute combined systolic and diastolic heart failure (HCC) 09/23/2019   Ventricular tachycardia, polymorphic (HCC) 09/23/2019   Pelvic pain affecting pregnancy 10/15/2015   Supervision of normal pregnancy in third trimester 09/28/2015   Poor weight gain of pregnancy 09/28/2015   Iron  deficiency anemia of pregnancy 09/01/2015   Increased BMI (body mass index) 07/29/2015   ONSET DATE: 09/23/19  REFERRING DIAG: Anoxic Brain Injury  THERAPY DIAG:  No diagnosis found.  Rationale for Evaluation and Treatment: Rehabilitation  SUBJECTIVE:  SUBJECTIVE STATEMENT: Pt acknowledged doing well today. Pt accompanied by:  Mother  PERTINENT HISTORY:  Pt. Is a 38 y.o. female with a history of severe aphasia  from a Cardiac Arrest leading to Anoxic Brain Injury on 09/23/19 at 2 weeks postpartum. Pt. Currently hs a G-Tube in place. PMHx includes: CAD, HLD, HTN, Ischemic cardiomyopathy, STEMI, V-Fib, and ICD on 11/29/20.  PRECAUTIONS: None  WEIGHT BEARING RESTRICTIONS: No  PAIN:  Are you having pain? No (Faces scale)  FALLS: Has patient fallen in last 6 months? No  LIVING ENVIRONMENT: Lives with: mother Lives in: House/apartment Stairs:  two story; No steps to enter. 10 steps inside, and has a chair lift Has following equipment at home: Walker - 2 wheeled and Wheelchair (manual), stair chair lift  PLOF: Independent  PATIENT GOALS: To improve bilateral hand function for increased engagement in ADLs, and IADL tasks.  OBJECTIVE:  Note: Objective measures were completed at Evaluation unless otherwise noted.  HAND DOMINANCE: Left  ADLs: Per father report  Transfers/ambulation related to ADLs: Eating:  Dependent/feeding tube Grooming: Independent washing face, assist with hair care  UB Dressing: Assist with donning  shirt LB Dressing: Assist with donning LE clothing  Toileting: Assist with transfers, toilet hygiene skills, and clothing negotiation Bathing:  Unknown  IADLs: Per father report Shopping: Total A Light housekeeping: Total A Meal Prep: Total A Medication management:   Total A Financial management: Total A Handwriting: 50% legible printing name only using 1 inch letters.  MOBILITY STATUS: Needs Assist: using walker  POSTURE COMMENTS: Sitting balance:  fair  ACTIVITY TOLERANCE: Activity tolerance:  Fair   FUNCTIONAL OUTCOME MEASURES: TBD  UPPER EXTREMITY ROM:    Active ROM Right Eval WFL Left Eval [facility]  Shoulder flexion    Shoulder abduction    Shoulder adduction    Shoulder extension    Shoulder internal rotation    Shoulder external rotation    Elbow flexion    Elbow extension    Wrist flexion    Wrist extension    Wrist ulnar deviation    Wrist radial deviation    Wrist pronation    Wrist supination    (Blank rows = not tested)  UPPER EXTREMITY MMT:     MMT Right eval Right 10/15/23 Right 12/24/23 Left eval Left 10/15/23 Left 12/24/23  Shoulder flexion 3/5 4-/5 4/5 3+/5 4-/5 4/5  Shoulder abduction 3/5 4-/5 4/5 3+/5 4-/5 4/5  Shoulder adduction        Shoulder extension        Shoulder internal rotation        Shoulder external rotation        Middle trapezius        Lower trapezius        Elbow flexion 3/5 4-/5 4+/5 3+/5 4-/5 4+/5  Elbow extension 3/5 4+/5 4+/5 3+/5 4-/5 4+/5  Wrist flexion        Wrist extension 3/5 3+/5 4+/5 3+/5 4-/5 4+/5  Wrist ulnar deviation        Wrist radial deviation        Wrist pronation        Wrist supination        (Blank rows = not tested)  HAND FUNCTION: Eval: Grip strength: Right: 10 lbs; Left: 18 lbs, Lateral pinch: Right: 5 lbs, Left: 7 lbs.  10/15/23: Grip strength: Right: 15 lbs; Left: 25 lbs, Lateral pinch: Right: 4 lbs, Left: 11 lbs.  12/24/23:  Grip strength: Right: 20 lbs, Left: 26 lbs; Lateral  pinch: Right: 10 lbs, Left: 12 lbs  COORDINATION: Eval:  Nine Hole Peg test: Pt. was able to formulate isolated 2nd digit extension in anticipation for grasping the horizontal pegs.  10/15/2023:   9 Hole Peg Test:   -Pt. was unable to grasp the pegs from a horizontal position, and transition them to a vertical position in preparation for placing them into the pegboard.   -To remove pegs only: Right: 1 min. & 45 sec.;  Left: 1 min. & 16 sec.  12/24/23: To remove pegs: Left 59 sec, Right: 1 min 30 sec   SENSATION: Light touch intact  EDEMA: N/A  COGNITION: Overall cognitive status: impaired   VISION:  Baseline:  wears glasses.  VISION ASSESSMENT: To be further assessed in functional context  PERCEPTION: WFL  PRAXIS: WFL  TREATMENT DATE: 12/17/2023 Therapeutic Activity: -Facilitated R hand grasp/release skills and lateral and 3 point pinch prehension patterns working with 1 velcro blocks and moving blocks between table top and velcro board.    Therapeutic Exercise: -Performed passive and AAROM for R/L shoulder flex/abd/ER behind head/IR behind back, R wrist ext, R hand digit ext and thumb abd, working to increase range of BUEs for BADLs.  Self Care: -UB dressing: max A to doff sweatshirt following max vc/tactile cues for sequencing and grasping strategies with sweatshirt to pull off arms of shirt and overtop of head. Max A to don, pulling overhead, and pulling shirt down behind back and in front, including max vc/tactile cues for sequencing and attention to task. -Facilitated lotion application to hands and face, with vc for bilat hand coordination and extending digits to rub lotion with palm of hands rather than fingertips.  PATIENT EDUCATION: Education details: ADL training, bilat hand coordination activities    Person educated: Patient Education method: Explanation  and Verbal cues, tactile cues Education comprehension: verbal cues required and tactile cues required  HOME EXERCISE PROGRAM:  -increasing opportunities for engaging the bilateral hands during ADL tasks.  GOALS: Goals reviewed with patient? Yes  SHORT TERM GOALS: Target date: 11/26/2023  Pt. Will require supervision HEPs for BUE strengthening  Baseline: Eval: No current HEP. Goal status: INITIAL   LONG TERM GOALS: Target date: 01/07/2024   Pt. Will increase BUE strength by 2 mm grades to assist with ADLs, and IADLs. Baseline: 10/15/23: RUE: Right shoulder flexion:  4-/5, abduction: 4-/5, elbow flexion: 4-/5, elbow extension: 4+/5, wrist extension: 3+/5; LUE: 4-/5 overall Eval:  RUE: 3/5 overall, LUE: 3+/5 overall Goal status: Progressing/Ongoing  2.  Pt. will improve bilateral grip strength by 5# to be able to securely hold items in her hands. Baseline: 10/15/23: Grip Right: 15#, Left: 25# Eval: Grip Right: 10#, Left: 18# Goal status: Progressing/Ongoing  3.  Pt. Will improve bilateral lateral pinch strength by 3# to assist with grooming tasks. Baseline: 10/15/23: lateral pinch strength:  R: 4#, L: 11# Eval: R: 4#, L: 7# Goal status: Progressing/Ongoing  4.  Pt. Will improve bilateral hand FMC grasping, and placing one peg in the 9 hole peg test in preparation for improved manipulation of small objects. Baseline: 10/15/23: 9 Hole Peg Test: Removing pegs only: R: 1 min. & 45 sec.; L: 1 min. &16 sec. Eval: Pt. is able to initiate formulating isolated 2nd digit extension in preparation for grasping small objects. Goal status: Progressing/Ongoing  5.  Pt.  Will write her name her full name with 75% legibility. Baseline: 10/15/23: Continue Eval: 50% legible printing name only using 1 inch letters. Goal status: Ongoing  6. Pt.  Will demonstrate modified techniques for ADLs with minA.  Baseline: 10/15/23: requires mod/maxA Eval: Does not currently use adaptive, or modified techniques during  ADLS.  Goal status: ongoing  7.  Pt will brush teeth with set up and mod A for thoroughness.  Baseline: 10/15/23: MaxA 08/29/23: Dep  Goal status:Ongoing  8.  Pt will don tshirt with min A.  Baseline: 10/15/23: ModA per mother. 08/29/23: Max-dep; mother reports pt gets her shirt turned around and often struggles to pull over her head  Goal status: Ongoing  9. Pt will spit saliva into cup using cup with handle and wipe mouth with washcloth/hand towel with supv-min A.  Baseline: 10/15/23: Mod-maxA 08/29/23: Max-dep; pt squeezes plastic cup too hard/paper cup slips from hands.  Pt struggles to wipe mouth thoroughly with  napkin or tissue.  Goal status: Ongoing   ASSESSMENT:  CLINICAL IMPRESSION: Pt requiring increased cues for attention to task this date.  Focus on R hand grasp/release and maximizing digit extension and thumb abd for easier release of items from hand.  On most trials with releasing blocks, pt required at least tactile cues or min A to initiate digit ext and thumb abd to release block from R hand, with a few reps of releasing with vc and extra time; limited by increased spasticity and decreased motor planning on the R as compared to the L.  Max A for donning/doffing sweatshirt this date, noting increased level of assist with sweatshirt with a shirt underneath as compared to a loose tshirt.  Pt. continues to benefit from OT services to maximize pt's participation in ADLs, increase ADL safety, and provide caregiver education as needed to ease caregiver assist with ADLs.   PERFORMANCE DEFICITS: in functional skills including ADLs, IADLs, coordination, dexterity, proprioception, ROM, strength, pain, Fine motor control, and UE functional use, cognitive skills including attention, memory, problem solving, and safety awareness, and psychosocial skills including coping strategies, environmental adaptation, and routines and behaviors.   IMPAIRMENTS: are limiting patient from ADLs, IADLs, and  leisure.   CO-MORBIDITIES: may have co-morbidities  that affects occupational performance. Patient will benefit from skilled OT to address above impairments and improve overall function.  MODIFICATION OR ASSISTANCE TO COMPLETE EVALUATION: Min-Moderate modification of tasks or assist with assess necessary to complete an evaluation.  OT OCCUPATIONAL PROFILE AND HISTORY: Detailed assessment: Review of records and additional review of physical, cognitive, psychosocial history related to current functional performance.  CLINICAL DECISION MAKING: Moderate - several treatment options, min-mod task modification necessary  REHAB POTENTIAL: Good  EVALUATION COMPLEXITY: Moderate    PLAN:  OT FREQUENCY: 1x/week  OT DURATION: 12 weeks  PLANNED INTERVENTIONS: 97535 self care/ADL training, 02889 therapeutic exercise, 97530 therapeutic activity, 97112 neuromuscular re-education, 97140 manual therapy, 97018 paraffin, 02989 moist heat, 97760 Orthotic Initial, 97761 Prosthetic Initial, 97763 Orthotic/Prosthetic subsequent, functional mobility training, visual/perceptual remediation/compensation, energy conservation, patient/family education, and DME and/or AE instructions  RECOMMENDED OTHER SERVICES: PT/ST  CONSULTED AND AGREED WITH PLAN OF CARE: Patient  PLAN FOR NEXT SESSION: Treatment  Inocente Blazing, MS, OTR/L

## 2023-12-24 NOTE — Therapy (Signed)
 OUTPATIENT PHYSICAL THERAPY TREATMENT   Patient Name: Beverly [name] MRN: [id number] DOB:[birth year], 38 y.o., female Today's Date: 12/24/2023  PCP: [facility] REFERRING PROVIDER: Babs Arthea DASEN, MD  END OF SESSION:   PT End of Session - 12/24/23 1358     Visit Number 45    Number of Visits 59    Date for Recertification  02/06/24    Authorization Type UHC Dual Complete; Medicaid secondary    Progress Note Due on Visit 40    PT Start Time 1404    PT Stop Time 1445    PT Time Calculation (min) 41 min    Equipment Utilized During Treatment Gait belt    Activity Tolerance Patient tolerated treatment well    Behavior During Therapy Flat affect;Impulsive;WFL for tasks assessed/performed          Past Medical History:  Diagnosis Date   Brain injury [facility])    CAD (coronary artery disease)    HLD (hyperlipidemia)    Hypertension    last pregnancy   Ischemic cardiomyopathy    STEMI (ST elevation myocardial infarction) (HCC) 09/2019   SCAD with aneurysmal dilation of proximal LAD.   Vaginal Pap smear, abnormal    when she was 38yo   Ventricular fibrillation (HCC) 09/23/2019   Past Surgical History:  Procedure Laterality Date   CARDIAC CATHETERIZATION     IR GASTROSTOMY TUBE MOD SED  10/07/2019   IR GASTROSTOMY TUBE REMOVAL  07/02/2020   IR REPLACE G-TUBE SIMPLE WO FLUORO  09/28/2023   IR REPLC GASTRO/COLONIC TUBE PERCUT W/FLUORO  11/03/2021   IR REPLC GASTRO/COLONIC TUBE PERCUT W/FLUORO  03/09/2023   LEFT HEART CATH AND CORONARY ANGIOGRAPHY N/A 09/23/2019   Procedure: LEFT HEART CATH AND CORONARY ANGIOGRAPHY;  Surgeon: Mady [name], MD;  Location: [facility] INVASIVE CV LAB;  Service: Cardiovascular;  Laterality: N/A;   SUBQ ICD IMPLANT N/A 11/26/2020   Procedure: SUBQ ICD IMPLANT;  Surgeon: [name], MD;  Location: [facility] INVASIVE CV LAB;  Service: Cardiovascular;  Laterality: N/A;   Patient Active Problem List   Diagnosis Date Noted    Hyperlipidemia 12/01/2023   Apraxia 10/05/2021   S/P ICD (internal cardiac defibrillator) procedure 11/27/2020   Hx of Cardiac arrest (HCC) 11/26/2020   Abnormality of gait 03/16/2020   Sleep disturbance 03/16/2020   Oropharyngeal dysphagia 02/12/2020   Coronary artery disease involving native coronary artery of native heart without angina pectoris 12/12/2019   Ischemic cardiomyopathy 12/12/2019   Brain injury (HCC)    Prediabetes    Anoxic brain injury (HCC) 11/05/2019   Dysphagia 11/05/2019   Physical deconditioning 11/05/2019   Acute delirium 11/05/2019   Respiratory failure (HCC)    Encounter for central line placement    Ventricular fibrillation (HCC) 09/23/2019   Acute combined systolic and diastolic heart failure (HCC) 09/23/2019   Ventricular tachycardia, polymorphic (HCC) 09/23/2019   Pelvic pain affecting pregnancy 10/15/2015   Supervision of normal pregnancy in third trimester 09/28/2015   Poor weight gain of pregnancy 09/28/2015   Iron  deficiency anemia of pregnancy 09/01/2015   Increased BMI (body mass index) 07/29/2015    ONSET DATE: August 2021  REFERRING DIAG:  G93.1 (ICD-10-CM) - Anoxic brain injury (HCC)  R48.2 (ICD-10-CM) - Apraxia    THERAPY DIAG:   Muscle weakness (generalized)  Other lack of coordination  Anoxic brain injury (HCC)  Difficulty in walking, not elsewhere classified  Other abnormalities of gait and mobility  Unsteadiness on feet  Abnormality of gait and  mobility  Cognitive communication deficit  Rationale for Evaluation and Treatment: Rehabilitation  SUBJECTIVE:                                                                                                                                                                                             SUBJECTIVE STATEMENT:   Pt reports no new complaints upon arrival.    Pt accompanied by: self   PERTINENT HISTORY:   Patient is a 38 year old with diagnois of anoxic brain  injury. Pt known to PT clinic. Previously seen for outpatient services March 2024. Pt was receiving HH PT until October 2024 per her mother's report. [name] reports pt using HHA for ambulation, was using RW but she says was told not to do to difficulty with safe technique. Pt has fallen 1x in the past six months trying to ambulate on her own. Pt requires assistance with ADLs, including bathing and dressing. [name] reports pt never indicates any pain. Pt with severe aphasia so is unable to answer questions/pt's mom provides hx. She reports pt has impaired short-term memory, difficulty with stair climbing (has stair lift at home). She reports pt's BLE are weaker than they used to be. The pt is probably limited to <10 min with ambulation due to poor endurance. Pt reports her goal is to be able to use an AD to achieve mod-I mobility rather than relying on someone's HHA to ambulate.She was receiving outpatient PT until Feb. 2022 last year but ran out of visits. Patient has past medical history of Anoxic Brain Injury 09/2019, CAD, HLD, HTN, ISCHEMIC CARDIMYOPATHY, STEMI, V-FIB, ICD on 11/29/2020. Patient is currently living with mom. Mother reports she is abel to walk some with a walker with supervision but primarily ambulates with HHA of mother who is her guardian. Mother also reports she requires assist for all transfers and ADL's.   PAIN:  Are you having pain? No  PRECAUTIONS: Fall  WEIGHT BEARING RESTRICTIONS: No  FALLS: Has patient fallen in last 6 months? Yes. Number of falls 1  LIVING ENVIRONMENT: Lives with: lives with their family, mother Lives in: House/apartment Stairs: has stairs but stair lift installed, pt using lift Has following equipment at home: Environmental Consultant - 2 wheeled, Wheelchair (manual), shower chair, and Grab bars, pt uses stair lift  PLOF: Needs assistance with ADLs  PATIENT GOALS: balance, walking, and pt states basketball  OBJECTIVE:  Note: Objective measures were  completed at Evaluation unless otherwise noted.  DIAGNOSTIC FINDINGS:  None recent in chart  COGNITION: Overall cognitive status: impaired   SENSATION: Unable to complete testing due to impaired cognition,  pt unable to report if sensation impairment present, pt mother unsure   EDEMA:  Mom reports pt's feet do swell from time to time   LOWER EXTREMITY MMT:  *accuracy of testing likely impacted by pt difficulty following cues  MMT Right Eval Left Eval  Hip flexion 4+ 4+  Hip extension    Hip abduction 4+ 4+  Hip adduction 4+ 4+  Hip internal rotation    Hip external rotation    Knee flexion 4+ 4+  Knee extension 5 5  Ankle dorsiflexion    Ankle plantarflexion    Ankle inversion    Ankle eversion    (Blank rows = not tested)  TRANSFERS: HHA  tried in session, very unsteady, pt has difficulty with motor planning, little success with cues due to distraction, very unsafe Attempted in session with RW, pt unsteady, gait ataxic, continued significant difficulty with motor planning requiring up to min a + 2 and multi-modal cues to safely sit in chair.   FUNCTIONAL TESTS:  5 times sit to stand: 1 min 43 sec Timed up and go (TUG): 1 min 30 sec with RW 10 meter walk test: 0.25 m/s with RW extensive cuing to complete activity  PATIENT SURVEYS:  SIS-16 filled out by pt's mother, used as questions relevant to pt: 45                                                                                                                              TREATMENT DATE: 12/24/2023   Pt arrived to therapy session in transport chair.  Unless otherwise stated, CGA/min A was provided and gait belt donned in order to ensure pt safety throughout session.   TherAct:   STS 2x5, with pt requiring short verbal cues for task as pt is continuously distracted by surrounding people/obstacles    Gait:  Donned LiteGait harness.    Gait overground with use of LiteGait in the gym to the treadmill,  ~25'  Gait training on treadmill; the following trials completed in litegait harness for safety, but not providing BWS (body weight support), with therapist cuing for body placement and keeping up with treadmill speed.   Round 1: B UE supported, pt ambulating at 1.0 mph, and able to achieve 1.5 minutes before fatiguing   Round 2: B UE supported, pt ambulating at 1.0 mph, and able to achieve 2 min 39 sec (227') before fatiguing   Round 3: B UE supported, pt ambulating at 0.8 mph, and able to achieve 3 min 21 sec minutes before fatiguing  Litegait support with +1 assistance for safety and monitoring.  Pt noted to have foot clearance complications on the L LE > R LE.  Pt unable to voice a BORG scale after the session.   PATIENT EDUCATION: Education details: goal reassessment, indications, exercise technique Person educated: Patient and Parent Education method: Explanation, Demonstration, Tactile cues, and Verbal cues Education comprehension: returned demonstration, verbal cues required, tactile cues required,  and needs further education   HOME EXERCISE PROGRAM: Access Code: [email or web address] URL: [email or web address] Date: 06/11/2023 Prepared by: Massie Dollar  Exercises - Standing March with Counter Support  - 1 x daily - 7 x weekly - 2 sets - 8 reps - Side Stepping with Counter Support  - 1 x daily - 7 x weekly - 2 sets - 4 reps - Sit to Stand with Armchair  - 1 x daily - 7 x weekly - 2 sets - 5 reps - Seated March  - 1 x daily - 7 x weekly - 3 sets - 10 reps  GOALS: Goals reviewed with patient? Yes   SHORT TERM GOALS: Target date: 07/11/2023  Patient will be independent in home exercise program to improve strength/mobility for better functional independence with ADLs. Baseline: Goal status: INITIAL   LONG TERM GOALS: Target date: 02/06/2024  1.  Patient will complete five times sit to stand test in < 35 seconds indicating an increased LE strength and improved  balance. Baseline: previously 37 sec with UUE 04/19/22 today 05/30/23: 1 min 43 seconds; 6/3: 41 sec with improved technique (use of BUE on armrests);  08/21/2023= 38 sec with some distractibility and VC for hand placement 7:23: 39.06 (41.23, 36.89 sec) 10/29/23: 56.85 with HHA; 1 min 16 sec with B UE on armrests 11/14/2023= 32.68 sec with BUE Support (VC for consistency of hand position)  12/05/23: 1 min 40 sec with R UE support (VC for proper technique) Goal status: IN PROGRESS  2. Patient will demo ability to ambulate > 400 ft w/ LRAD versus hand held assist min assist to demonstrate improved household mobility and short community distances.  Baseline: 4/28: 173ft with RW. Mod assist for AD management to prevent veer to the R. 6/3: ~ 300 ft with RW, requires cuing throughout for proximity to RW particularly with turns, but this has improved compared to eval;  08/21/2023= Patient ambulated approx 300 feet with RW- Sill some min assist at times to navigate walker and VC to remain close to walker.  7/23: 392ft with RW and min assist for safety and moderate instruction for safety AD intermittently, especially with fatigue  10/29/23:  Pt able to ambulate 500' with RW, however required modA at times to keep the walker stable 11/14/2023= Will assess next visit 12/05/23: Pt able to ambulate ~300' with RW and modA from therapist to keep the walker close/stable Goal status: IN PROGRESS  3.  Patient will increase 10 meter walk test to >1.23m/s as to improve gait speed for better community ambulation and to reduce fall risk. Baseline: previously 0.36 m/s on 04/19/22 : 0.25 m/s;  07/10/23: 0. 24 m/s with RW;  08/15/2023= 0.39 m/s  with RW yet max VC for RW navigation.  7/23:Average Normal speed with RW: 0.25 m/s Average Normal speed with HHA: 0.15m/s 10/29/23: Average Normal speed with RW: 0.23 m/s 11/14/2023= 0.28 m/s with RW (VC for navigation); 0.45 m/s with HHA 12/05/23: 0.18 m/s; 53.95 sec with RW  Goal status:  PROGRESSING  4.  Patient will reduce timed up and go (TUG) to <50 seconds to reduce fall risk and demonstrate improved transfer/gait ability Baseline: previously 91 sec with RW on 04/19/22; 05/30/23: 1 min 30 sec with RW 07/19/2023: 48.265 seconds using RW with skilled min A primarily for AD management with pt having difficulty turning with AD;  08/21/2023- 33 sec with HHA and 1 min 25 sec with RW (Max VC for safety with turning back to seat and to reach  back safely)  7/23:42.18sec with RW and min assist for safety in turn; 40.35 sec with HHA and min assist for safety with turns.  10/29/23: TBD 11/14/2023= 43 sec with RW with difficulty turning 180 at end and max VC to reach back; 31 sec with HHA today. VC for turning.  Goal status: MET  5. Pt will exhibit correct and safe technique with stand>sit into a standard chair or transport chair to reduce risk of falling.   Baseline: pt current technique unsafe, poor motor planning; 6/3: pt with improved technique, bring RW closer to chair prior to sitting; 08/21/2023- Patient able to perform with CGA yet inconsistent with cues for safety and consistency with hand placement.  10/29/23: Pt still with inconsistent STS's, utilizing heavy UE assistance  11/14/2023= Patient continues to be inconsistent but performed around 10 times today and able to reach back and push up using UE appropriately 7/10 times.  Goal status: PARTIALLY MET   6. The pt will demonstrate at least a 15 point improvement on SIS-16 to indicate increased ease with ADLs. Baseline: 45 (filled out by pt's mother); 42; 08/21/2023= 46; 09/03/2023: 46/80 10/29/23: TBD 11/19/2023- Mother not present so will assess next visit she is present. Goal status: ONGOING   ASSESSMENT:  CLINICAL IMPRESSION:   Pt performed well on the LiteGait treadmill with the use of the harness for safety.  Pt did not have any body weight support, just utilizing the harness to keep pt upright if she were to lose balance.  Pt  was able to ambulate longer distance and at a faster speed.  Pt also even able to take a few steps backwards with the use of the LiteGait machine/harness.  Pt will continue to benefit from this and may do well with overground ambulation to focus on backwards ambulation attempts.   Pt will continue to benefit from skilled therapy to address remaining deficits in order to improve overall QoL and return to PLOF.         OBJECTIVE IMPAIRMENTS: Abnormal gait, decreased activity tolerance, decreased balance, decreased coordination, decreased endurance, decreased knowledge of use of DME, decreased mobility, difficulty walking, decreased strength, decreased safety awareness, increased edema, impaired tone, improper body mechanics, and postural dysfunction.   ACTIVITY LIMITATIONS: carrying, lifting, bending, standing, squatting, stairs, transfers, bathing, dressing, locomotion level, and caring for others  PARTICIPATION LIMITATIONS: meal prep, cleaning, laundry, medication management, personal finances, driving, shopping, community activity, occupation, and yard work  PERSONAL FACTORS: Sex, Time since onset of injury/illness/exacerbation, and 1-2 comorbidities: per chart PMH includes CAD, HTN, ischemic cardiomyopathy, STEMI, ventricular fibrillation, dysphagia, s/p ICD, apraxia  are also affecting patient's functional outcome.   REHAB POTENTIAL: Fair    CLINICAL DECISION MAKING: Evolving/moderate complexity  EVALUATION COMPLEXITY: High  PLAN:  PT FREQUENCY: 1-2x/week  PT DURATION: 12 weeks  PLANNED INTERVENTIONS: 97164- PT Re-evaluation, 97750- Physical Performance Testing, 97110-Therapeutic exercises, 97530- Therapeutic activity, 97112- Neuromuscular re-education, 97535- Self Care, [id number]- Manual therapy, [phone]- Gait training, [phone]- Orthotic Initial, [phone]- Orthotic/Prosthetic subsequent, [phone]- Canalith repositioning, Patient/Family education, Balance training, Stair training, Taping, Joint  mobilization, Spinal mobilization, Vestibular training, DME instructions, Wheelchair mobility training, Cryotherapy, and Moist heat   PLAN FOR NEXT SESSION:   backwards gait training using RW with skilled assistance - perform in environment with limited distractions to allow improved motor learning Transfer training - learning sequence of AD management and stepping back to seat Gait training using RW in home-like environment Dynamic balance Reaching with use of Blaze Pods for tapping hands  Fonda Simpers, PT, DPT Physical Therapist - [facility]  12/24/23, 4:18 PM

## 2023-12-25 ENCOUNTER — Telehealth: Payer: Self-pay

## 2023-12-25 NOTE — Telephone Encounter (Signed)
 The fax was sent and is documented in the chart.

## 2023-12-25 NOTE — Telephone Encounter (Signed)
 Copied from CRM [id number]. Topic: General - Call Back - No Documentation >> Dec 25, 2023 10:47 AM Alfonso ORN wrote: Reason for CRM: Cathlean from [employer] called to f/u on documents that were faxed to them 10/13 . chart notes missing for paperwork sent, page 2 date of onset missing   callback:[phone]  fax: [phone]

## 2023-12-25 NOTE — Telephone Encounter (Signed)
 Spoke to pt's mom. Stated she is unfamiliar with this company. Informed mom we would document that she didn't request them. Reviewed chart there is no documentation that anything was faxed on the listed dated also reviewed efaxes there is no paperwork to represent this information. RN Supervisor made aware of call and request

## 2023-12-26 ENCOUNTER — Ambulatory Visit: Admitting: Occupational Therapy

## 2023-12-26 ENCOUNTER — Ambulatory Visit

## 2023-12-26 DIAGNOSIS — M6281 Muscle weakness (generalized): Secondary | ICD-10-CM

## 2023-12-26 DIAGNOSIS — R262 Difficulty in walking, not elsewhere classified: Secondary | ICD-10-CM

## 2023-12-26 DIAGNOSIS — R269 Unspecified abnormalities of gait and mobility: Secondary | ICD-10-CM

## 2023-12-26 DIAGNOSIS — R41841 Cognitive communication deficit: Secondary | ICD-10-CM

## 2023-12-26 DIAGNOSIS — R278 Other lack of coordination: Secondary | ICD-10-CM

## 2023-12-26 DIAGNOSIS — G931 Anoxic brain damage, not elsewhere classified: Secondary | ICD-10-CM

## 2023-12-26 DIAGNOSIS — R2681 Unsteadiness on feet: Secondary | ICD-10-CM

## 2023-12-26 DIAGNOSIS — R2689 Other abnormalities of gait and mobility: Secondary | ICD-10-CM

## 2023-12-26 NOTE — Telephone Encounter (Signed)
 Paperwork was faxed to [facility] on 12/21/23 and it is documented in the Patient chart and the Mother of the Patient confirmed  yes, she uses [employer].

## 2023-12-26 NOTE — Therapy (Signed)
 OUTPATIENT PHYSICAL THERAPY TREATMENT   Patient Name: Beverly [name] MRN: [id number] DOB:December 23, 1985, 38 y.o., female Today's Date: 12/26/2023  [facility]P: [facility] REFERRING PROVIDER: Babs Arthea DASEN, MD  END OF SESSION:   PT End of Session - 12/26/23 1438     Visit Number 46    Number of Visits 59    Date for Recertification  02/06/24    Authorization Type UHC Dual Complete; Medicaid secondary    Progress Note Due on Visit 40    PT Start Time 1403    PT Stop Time 1445    PT Time Calculation (min) 42 min    Equipment Utilized During Treatment Gait belt    Activity Tolerance Patient tolerated treatment well    Behavior During Therapy Flat affect;Impulsive;WFL for tasks assessed/performed          Past Medical History:  Diagnosis Date   Brain injury [facility])    CAD (coronary artery disease)    HLD (hyperlipidemia)    Hypertension    last pregnancy   Ischemic cardiomyopathy    STEMI (ST elevation myocardial infarction) (HCC) 09/2019   SCAD with aneurysmal dilation of proximal LAD.   Vaginal Pap smear, abnormal    when she was 38yo   Ventricular fibrillation (HCC) 09/23/2019   Past Surgical History:  Procedure Laterality Date   CARDIAC CATHETERIZATION     IR GASTROSTOMY TUBE MOD SED  10/07/2019   IR GASTROSTOMY TUBE REMOVAL  07/02/2020   IR REPLACE G-TUBE SIMPLE WO FLUORO  09/28/2023   IR REPLC GASTRO/COLONIC TUBE PERCUT W/FLUORO  11/03/2021   IR REPLC GASTRO/COLONIC TUBE PERCUT W/FLUORO  03/09/2023   LEFT HEART CATH AND CORONARY ANGIOGRAPHY N/A 09/23/2019   Procedure: LEFT HEART CATH AND CORONARY ANGIOGRAPHY;  Surgeon: Mady [name], MD;  Location: [facility] INVASIVE CV LAB;  Service: Cardiovascular;  Laterality: N/A;   SUBQ ICD IMPLANT N/A 11/26/2020   Procedure: SUBQ ICD IMPLANT;  Surgeon: [name], MD;  Location: [facility] INVASIVE CV LAB;  Service: Cardiovascular;  Laterality: N/A;   Patient Active Problem List   Diagnosis Date Noted    Hyperlipidemia 12/01/2023   Apraxia 10/05/2021   S/P ICD (internal cardiac defibrillator) procedure 11/27/2020   Hx of Cardiac arrest (HCC) 11/26/2020   Abnormality of gait 03/16/2020   Sleep disturbance 03/16/2020   Oropharyngeal dysphagia 02/12/2020   Coronary artery disease involving native coronary artery of native heart without angina pectoris 12/12/2019   Ischemic cardiomyopathy 12/12/2019   Brain injury (HCC)    Prediabetes    Anoxic brain injury (HCC) 11/05/2019   Dysphagia 11/05/2019   Physical deconditioning 11/05/2019   Acute delirium 11/05/2019   Respiratory failure (HCC)    Encounter for central line placement    Ventricular fibrillation (HCC) 09/23/2019   Acute combined systolic and diastolic heart failure (HCC) 09/23/2019   Ventricular tachycardia, polymorphic (HCC) 09/23/2019   Pelvic pain affecting pregnancy 10/15/2015   Supervision of normal pregnancy in third trimester 09/28/2015   Poor weight gain of pregnancy 09/28/2015   Iron  deficiency anemia of pregnancy 09/01/2015   Increased BMI (body mass index) 07/29/2015    ONSET DATE: August 2021  REFERRING DIAG:  G93.1 (ICD-10-CM) - Anoxic brain injury (HCC)  R48.2 (ICD-10-CM) - Apraxia    THERAPY DIAG:   Muscle weakness (generalized)  Other lack of coordination  Anoxic brain injury (HCC)  Difficulty in walking, not elsewhere classified  Other abnormalities of gait and mobility  Unsteadiness on feet  Abnormality of gait and  mobility  Cognitive communication deficit  Rationale for Evaluation and Treatment: Rehabilitation  SUBJECTIVE:                                                                                                                                                                                             SUBJECTIVE STATEMENT:   Pt denies any new complaints upon arrival   Pt accompanied by: self, father   PERTINENT HISTORY:   Patient is a 38 year old with diagnois of anoxic  brain injury. Pt known to PT clinic. Previously seen for outpatient services March 2024. Pt was receiving HH PT until October 2024 per her mother's report. [name] reports pt using HHA for ambulation, was using RW but she says was told not to do to difficulty with safe technique. Pt has fallen 1x in the past six months trying to ambulate on her own. Pt requires assistance with ADLs, including bathing and dressing. [name] reports pt never indicates any pain. Pt with severe aphasia so is unable to answer questions/pt's mom provides hx. She reports pt has impaired short-term memory, difficulty with stair climbing (has stair lift at home). She reports pt's BLE are weaker than they used to be. The pt is probably limited to <10 min with ambulation due to poor endurance. Pt reports her goal is to be able to use an AD to achieve mod-I mobility rather than relying on someone's HHA to ambulate.She was receiving outpatient PT until Feb. 2022 last year but ran out of visits. Patient has past medical history of Anoxic Brain Injury 09/2019, CAD, HLD, HTN, ISCHEMIC CARDIMYOPATHY, STEMI, V-FIB, ICD on 11/29/2020. Patient is currently living with mom. Mother reports she is abel to walk some with a walker with supervision but primarily ambulates with HHA of mother who is her guardian. Mother also reports she requires assist for all transfers and ADL's.   PAIN:  Are you having pain? No  PRECAUTIONS: Fall  WEIGHT BEARING RESTRICTIONS: No  FALLS: Has patient fallen in last 6 months? Yes. Number of falls 1  LIVING ENVIRONMENT: Lives with: lives with their family, mother Lives in: House/apartment Stairs: has stairs but stair lift installed, pt using lift Has following equipment at home: Environmental Consultant - 2 wheeled, Wheelchair (manual), shower chair, and Grab bars, pt uses stair lift  PLOF: Needs assistance with ADLs  PATIENT GOALS: balance, walking, and pt states basketball  OBJECTIVE:  Note: Objective measures were  completed at Evaluation unless otherwise noted.  DIAGNOSTIC FINDINGS:  None recent in chart  COGNITION: Overall cognitive status: impaired   SENSATION: Unable to complete testing due to impaired cognition,  pt unable to report if sensation impairment present, pt mother unsure   EDEMA:  Mom reports pt's feet do swell from time to time   LOWER EXTREMITY MMT:  *accuracy of testing likely impacted by pt difficulty following cues  MMT Right Eval Left Eval  Hip flexion 4+ 4+  Hip extension    Hip abduction 4+ 4+  Hip adduction 4+ 4+  Hip internal rotation    Hip external rotation    Knee flexion 4+ 4+  Knee extension 5 5  Ankle dorsiflexion    Ankle plantarflexion    Ankle inversion    Ankle eversion    (Blank rows = not tested)  TRANSFERS: HHA  tried in session, very unsteady, pt has difficulty with motor planning, little success with cues due to distraction, very unsafe Attempted in session with RW, pt unsteady, gait ataxic, continued significant difficulty with motor planning requiring up to min a + 2 and multi-modal cues to safely sit in chair.   FUNCTIONAL TESTS:  5 times sit to stand: 1 min 43 sec Timed up and go (TUG): 1 min 30 sec with RW 10 meter walk test: 0.25 m/s with RW extensive cuing to complete activity  PATIENT SURVEYS:  SIS-16 filled out by pt's mother, used as questions relevant to pt: 45                                                                                                                              TREATMENT DATE: 12/26/2023   Pt arrived to therapy session in transport chair.  Unless otherwise stated, CGA/min A was provided and gait belt donned in order to ensure pt safety throughout session.   Gait:  Overground ambulation with use of walker and BlueTB around the walker for tactile feedback to keep the walker close by, 1 full lap around the gym (150')  Donned LiteGait harness.    Gait overground with use of LiteGait in the gym to  the treadmill, ~25'  Gait training on treadmill; the following trials completed in litegait harness for safety, but not providing BWS (body weight support), with therapist cuing for body placement and keeping up with treadmill speed.   Round 1: B UE supported, pt ambulating at 0.8 mph, and able to achieve 2 minutes before fatiguing  Round 2: B UE supported, pt ambulating at 0.8 mph, and able to achieve 3 min before fatiguing   Round 3: B UE supported, pt ambulating backwards at 0.5 mph, and able to achieve 2 minutes before fatiguing  Litegait support with +1 assistance for safety and monitoring.  Pt noted to have foot clearance complications on the L LE > R LE.  Pt unable to voice a BORG scale after the session.   PATIENT EDUCATION: Education details: goal reassessment, indications, exercise technique Person educated: Patient and Parent Education method: Explanation, Demonstration, Tactile cues, and Verbal cues Education comprehension: returned demonstration, verbal cues required, tactile cues required, and needs  further education   HOME EXERCISE PROGRAM: Access Code: [email or web address] URL: [email or web address] Date: 06/11/2023 Prepared by: Massie Dollar  Exercises - Standing March with Counter Support  - 1 x daily - 7 x weekly - 2 sets - 8 reps - Side Stepping with Counter Support  - 1 x daily - 7 x weekly - 2 sets - 4 reps - Sit to Stand with Armchair  - 1 x daily - 7 x weekly - 2 sets - 5 reps - Seated March  - 1 x daily - 7 x weekly - 3 sets - 10 reps  GOALS: Goals reviewed with patient? Yes   SHORT TERM GOALS: Target date: 07/11/2023  Patient will be independent in home exercise program to improve strength/mobility for better functional independence with ADLs. Baseline: Goal status: INITIAL   LONG TERM GOALS: Target date: 02/06/2024  1.  Patient will complete five times sit to stand test in < 35 seconds indicating an increased LE strength and improved  balance. Baseline: previously 37 sec with UUE 04/19/22 today 05/30/23: 1 min 43 seconds; 6/3: 41 sec with improved technique (use of BUE on armrests);  08/21/2023= 38 sec with some distractibility and VC for hand placement 7:23: 39.06 (41.23, 36.89 sec) 10/29/23: 56.85 with HHA; 1 min 16 sec with B UE on armrests 11/14/2023= 32.68 sec with BUE Support (VC for consistency of hand position)  12/05/23: 1 min 40 sec with R UE support (VC for proper technique) Goal status: IN PROGRESS  2. Patient will demo ability to ambulate > 400 ft w/ LRAD versus hand held assist min assist to demonstrate improved household mobility and short community distances.  Baseline: 4/28: 164ft with RW. Mod assist for AD management to prevent veer to the R. 6/3: ~ 300 ft with RW, requires cuing throughout for proximity to RW particularly with turns, but this has improved compared to eval;  08/21/2023= Patient ambulated approx 300 feet with RW- Sill some min assist at times to navigate walker and VC to remain close to walker.  7/23: 37ft with RW and min assist for safety and moderate instruction for safety AD intermittently, especially with fatigue  10/29/23:  Pt able to ambulate 500' with RW, however required modA at times to keep the walker stable 11/14/2023= Will assess next visit 12/05/23: Pt able to ambulate ~300' with RW and modA from therapist to keep the walker close/stable Goal status: IN PROGRESS  3.  Patient will increase 10 meter walk test to >1.46m/s as to improve gait speed for better community ambulation and to reduce fall risk. Baseline: previously 0.36 m/s on 04/19/22 : 0.25 m/s;  07/10/23: 0. 24 m/s with RW;  08/15/2023= 0.39 m/s  with RW yet max VC for RW navigation.  7/23:Average Normal speed with RW: 0.25 m/s Average Normal speed with HHA: 0.51m/s 10/29/23: Average Normal speed with RW: 0.23 m/s 11/14/2023= 0.28 m/s with RW (VC for navigation); 0.45 m/s with HHA 12/05/23: 0.18 m/s; 53.95 sec with RW  Goal status:  PROGRESSING  4.  Patient will reduce timed up and go (TUG) to <50 seconds to reduce fall risk and demonstrate improved transfer/gait ability Baseline: previously 91 sec with RW on 04/19/22; 05/30/23: 1 min 30 sec with RW 07/19/2023: 48.265 seconds using RW with skilled min A primarily for AD management with pt having difficulty turning with AD;  08/21/2023- 33 sec with HHA and 1 min 25 sec with RW (Max VC for safety with turning back to seat and to reach back safely)  7/23:42.18sec with RW and min assist for safety in turn; 40.35 sec with HHA and min assist for safety with turns.  10/29/23: TBD 11/14/2023= 43 sec with RW with difficulty turning 180 at end and max VC to reach back; 31 sec with HHA today. VC for turning.  Goal status: MET  5. Pt will exhibit correct and safe technique with stand>sit into a standard chair or transport chair to reduce risk of falling.   Baseline: pt current technique unsafe, poor motor planning; 6/3: pt with improved technique, bring RW closer to chair prior to sitting; 08/21/2023- Patient able to perform with CGA yet inconsistent with cues for safety and consistency with hand placement.  10/29/23: Pt still with inconsistent STS's, utilizing heavy UE assistance  11/14/2023= Patient continues to be inconsistent but performed around 10 times today and able to reach back and push up using UE appropriately 7/10 times.  Goal status: PARTIALLY MET   6. The pt will demonstrate at least a 15 point improvement on SIS-16 to indicate increased ease with ADLs. Baseline: 45 (filled out by pt's mother); 42; 08/21/2023= 46; 09/03/2023: 46/80 10/29/23: TBD 11/19/2023- Mother not present so will assess next visit she is present. Goal status: ONGOING   ASSESSMENT:  CLINICAL IMPRESSION:   Pt responded well to the exercises today and was able to try retro ambulation on the LiteGait.  Pt performed well with the task and it forced her to take larger steps at an increased cadence that she  would not normally perform.  Pt has made improvements over the past couple of sessions and will likely benefit from LiteGait use over the next several sessions.   Pt will continue to benefit from skilled therapy to address remaining deficits in order to improve overall QoL and return to PLOF.        OBJECTIVE IMPAIRMENTS: Abnormal gait, decreased activity tolerance, decreased balance, decreased coordination, decreased endurance, decreased knowledge of use of DME, decreased mobility, difficulty walking, decreased strength, decreased safety awareness, increased edema, impaired tone, improper body mechanics, and postural dysfunction.   ACTIVITY LIMITATIONS: carrying, lifting, bending, standing, squatting, stairs, transfers, bathing, dressing, locomotion level, and caring for others  PARTICIPATION LIMITATIONS: meal prep, cleaning, laundry, medication management, personal finances, driving, shopping, community activity, occupation, and yard work  PERSONAL FACTORS: Sex, Time since onset of injury/illness/exacerbation, and 1-2 comorbidities: per chart PMH includes CAD, HTN, ischemic cardiomyopathy, STEMI, ventricular fibrillation, dysphagia, s/p ICD, apraxia  are also affecting patient's functional outcome.   REHAB POTENTIAL: Fair    CLINICAL DECISION MAKING: Evolving/moderate complexity  EVALUATION COMPLEXITY: High  PLAN:  PT FREQUENCY: 1-2x/week  PT DURATION: 12 weeks  PLANNED INTERVENTIONS: [id number]- PT Re-evaluation, 97750- Physical Performance Testing, 97110-Therapeutic exercises, 97530- Therapeutic activity, 97112- Neuromuscular re-education, 97535- Self Care, [id number]- Manual therapy, [phone]- Gait training, [phone]- Orthotic Initial, [phone]- Orthotic/Prosthetic subsequent, [phone]- Canalith repositioning, Patient/Family education, Balance training, Stair training, Taping, Joint mobilization, Spinal mobilization, Vestibular training, DME instructions, Wheelchair mobility training, Cryotherapy, and Moist  heat   PLAN FOR NEXT SESSION:   backwards gait training using RW with skilled assistance - perform in environment with limited distractions to allow improved motor learning Transfer training - learning sequence of AD management and stepping back to seat Gait training using RW in home-like environment Dynamic balance Reaching with use of Blaze Pods for tapping hands    Fonda Simpers, PT, DPT Physical Therapist - [facility]  12/26/23, 3:04 PM

## 2023-12-27 NOTE — Telephone Encounter (Signed)
 Patient's Mother Myrick states due to Insurance they have stopped the Patient's nutrition with [employer]. Mother Myrick states they need help finding another nutrition company. Please help.

## 2023-12-27 NOTE — Telephone Encounter (Signed)
 Can you please check the message states the paper work is incomplete.     Discard previous message

## 2023-12-27 NOTE — Telephone Encounter (Signed)
 Can you please check the message states the paper work is complete.

## 2023-12-27 NOTE — Telephone Encounter (Signed)
 Spoke with Mother Myrick and she states yes, please fax the paperwork to [employer] for the purewick supplies.

## 2023-12-28 ENCOUNTER — Other Ambulatory Visit: Payer: Self-pay | Admitting: Nurse Practitioner

## 2023-12-28 DIAGNOSIS — R1310 Dysphagia, unspecified: Secondary | ICD-10-CM

## 2023-12-28 NOTE — Telephone Encounter (Signed)
 Noted

## 2023-12-28 NOTE — Telephone Encounter (Signed)
 Spoke to Mother Myrick and she is agreeable to the case worker and calling her Insurance and says 'THANKS!

## 2023-12-28 NOTE — Telephone Encounter (Signed)
 Left message to call the office back.

## 2023-12-28 NOTE — Telephone Encounter (Unsigned)
 Copied from CRM [id number]. Topic: General - Other >> Dec 28, 2023  1:47 PM Macario HERO wrote: Reason for CRM: Patient mom called regarding missed call. Called [employer] and advised with patients. Advised mom will return call.

## 2023-12-28 NOTE — Telephone Encounter (Signed)
 Please call pt to call the insurance to check which company they will cover for feeding tube nutrtion. We have also sent referral a RN case manger or Social worker will reach out to them.

## 2023-12-30 ENCOUNTER — Other Ambulatory Visit: Payer: Self-pay | Admitting: Cardiology

## 2023-12-30 NOTE — Therapy (Incomplete)
 OUTPATIENT PHYSICAL THERAPY TREATMENT   Patient Name: Beverly [name] MRN: [id number] DOB:May 07, 1985, 38 y.o., female Today's Date: 12/30/2023  PCP: [facility] REFERRING PROVIDER: Babs Arthea DASEN, MD  END OF SESSION:     Past Medical History:  Diagnosis Date   Brain injury [facility])    CAD (coronary artery disease)    HLD (hyperlipidemia)    Hypertension    last pregnancy   Ischemic cardiomyopathy    STEMI (ST elevation myocardial infarction) (HCC) 09/2019   SCAD with aneurysmal dilation of proximal LAD.   Vaginal Pap smear, abnormal    when she was 38yo   Ventricular fibrillation (HCC) 09/23/2019   Past Surgical History:  Procedure Laterality Date   CARDIAC CATHETERIZATION     IR GASTROSTOMY TUBE MOD SED  10/07/2019   IR GASTROSTOMY TUBE REMOVAL  07/02/2020   IR REPLACE G-TUBE SIMPLE WO FLUORO  09/28/2023   IR REPLC GASTRO/COLONIC TUBE PERCUT W/FLUORO  11/03/2021   IR REPLC GASTRO/COLONIC TUBE PERCUT W/FLUORO  03/09/2023   LEFT HEART CATH AND CORONARY ANGIOGRAPHY N/A 09/23/2019   Procedure: LEFT HEART CATH AND CORONARY ANGIOGRAPHY;  Surgeon: Mady [name], MD;  Location: [facility] INVASIVE CV LAB;  Service: Cardiovascular;  Laterality: N/A;   SUBQ ICD IMPLANT N/A 11/26/2020   Procedure: SUBQ ICD IMPLANT;  Surgeon: [name], MD;  Location: [facility] INVASIVE CV LAB;  Service: Cardiovascular;  Laterality: N/A;   Patient Active Problem List   Diagnosis Date Noted   Hyperlipidemia 12/01/2023   Apraxia 10/05/2021   S/P ICD (internal cardiac defibrillator) procedure 11/27/2020   Hx of Cardiac arrest (HCC) 11/26/2020   Abnormality of gait 03/16/2020   Sleep disturbance 03/16/2020   Oropharyngeal dysphagia 02/12/2020   Coronary artery disease involving native coronary artery of native heart without angina pectoris 12/12/2019   Ischemic cardiomyopathy 12/12/2019   Brain injury (HCC)    Prediabetes    Anoxic brain injury (HCC) 11/05/2019   Dysphagia  11/05/2019   Physical deconditioning 11/05/2019   Acute delirium 11/05/2019   Respiratory failure (HCC)    Encounter for central line placement    Ventricular fibrillation (HCC) 09/23/2019   Acute combined systolic and diastolic heart failure (HCC) 09/23/2019   Ventricular tachycardia, polymorphic (HCC) 09/23/2019   Pelvic pain affecting pregnancy 10/15/2015   Supervision of normal pregnancy in third trimester 09/28/2015   Poor weight gain of pregnancy 09/28/2015   Iron  deficiency anemia of pregnancy 09/01/2015   Increased BMI (body mass index) 07/29/2015    ONSET DATE: August 2021  REFERRING DIAG:  G93.1 (ICD-10-CM) - Anoxic brain injury (HCC)  R48.2 (ICD-10-CM) - Apraxia    THERAPY DIAG:   No diagnosis found.  Rationale for Evaluation and Treatment: Rehabilitation  SUBJECTIVE:  SUBJECTIVE STATEMENT:   Pt denies any new complaints upon arrival   Pt accompanied by: self, father   PERTINENT HISTORY:   Patient is a 38 year old with diagnois of anoxic brain injury. Pt known to PT clinic. Previously seen for outpatient services March 2024. Pt was receiving HH PT until October 2024 per her mother's report. [name] reports pt using HHA for ambulation, was using RW but she says was told not to do to difficulty with safe technique. Pt has fallen 1x in the past six months trying to ambulate on her own. Pt requires assistance with ADLs, including bathing and dressing. [name] reports pt never indicates any pain. Pt with severe aphasia so is unable to answer questions/pt's mom provides hx. She reports pt has impaired short-term memory, difficulty with stair climbing (has stair lift at home). She reports pt's BLE are weaker than they used to be. The pt is probably limited to <10 min with ambulation  due to poor endurance. Pt reports her goal is to be able to use an AD to achieve mod-I mobility rather than relying on someone's HHA to ambulate.She was receiving outpatient PT until Feb. 2022 last year but ran out of visits. Patient has past medical history of Anoxic Brain Injury 09/2019, CAD, HLD, HTN, ISCHEMIC CARDIMYOPATHY, STEMI, V-FIB, ICD on 11/29/2020. Patient is currently living with mom. Mother reports she is abel to walk some with a walker with supervision but primarily ambulates with HHA of mother who is her guardian. Mother also reports she requires assist for all transfers and ADL's.   PAIN:  Are you having pain? No  PRECAUTIONS: Fall  WEIGHT BEARING RESTRICTIONS: No  FALLS: Has patient fallen in last 6 months? Yes. Number of falls 1  LIVING ENVIRONMENT: Lives with: lives with their family, mother Lives in: House/apartment Stairs: has stairs but stair lift installed, pt using lift Has following equipment at home: [employer] - 2 wheeled, Wheelchair (manual), shower chair, and Grab bars, pt uses stair lift  PLOF: Needs assistance with ADLs  PATIENT GOALS: balance, walking, and pt states basketball  OBJECTIVE:  Note: Objective measures were completed at Evaluation unless otherwise noted.  DIAGNOSTIC FINDINGS:  None recent in chart  COGNITION: Overall cognitive status: impaired   SENSATION: Unable to complete testing due to impaired cognition, pt unable to report if sensation impairment present, pt mother unsure   EDEMA:  Mom reports pt's feet do swell from time to time   LOWER EXTREMITY MMT:  *accuracy of testing likely impacted by pt difficulty following cues  MMT Right Eval Left Eval  Hip flexion 4+ 4+  Hip extension    Hip abduction 4+ 4+  Hip adduction 4+ 4+  Hip internal rotation    Hip external rotation    Knee flexion 4+ 4+  Knee extension 5 5  Ankle dorsiflexion    Ankle plantarflexion    Ankle inversion    Ankle eversion    (Blank rows = not  tested)  TRANSFERS: HHA  tried in session, very unsteady, pt has difficulty with motor planning, little success with cues due to distraction, very unsafe Attempted in session with RW, pt unsteady, gait ataxic, continued significant difficulty with motor planning requiring up to min a + 2 and multi-modal cues to safely sit in chair.   FUNCTIONAL TESTS:  5 times sit to stand: 1 min 43 sec Timed up and go (TUG): 1 min 30 sec with RW 10 meter walk test: 0.25 m/s with RW  extensive cuing to complete activity  PATIENT SURVEYS:  SIS-16 filled out by pt's mother, used as questions relevant to pt: 45                                                                                                                              TREATMENT DATE: 12/30/2023   Pt arrived to therapy session in transport chair.  Unless otherwise stated, CGA/min A was provided and gait belt donned in order to ensure pt safety throughout session.   Gait:  Overground ambulation with use of walker and BlueTB around the walker for tactile feedback to keep the walker close by, 1 full lap around the gym (150')  Donned LiteGait harness.    Gait overground with use of LiteGait in the gym to the treadmill, ~25'  Gait training on treadmill; the following trials completed in litegait harness for safety, but not providing BWS (body weight support), with therapist cuing for body placement and keeping up with treadmill speed.   Round 1: B UE supported, pt ambulating at 0.8 mph, and able to achieve 2 minutes before fatiguing  Round 2: B UE supported, pt ambulating at 0.8 mph, and able to achieve 3 min before fatiguing   Round 3: B UE supported, pt ambulating backwards at 0.5 mph, and able to achieve 2 minutes before fatiguing  Litegait support with +1 assistance for safety and monitoring.  Pt noted to have foot clearance complications on the L LE > R LE.  Pt unable to voice a BORG scale after the session.   PATIENT  EDUCATION: Education details: goal reassessment, indications, exercise technique Person educated: Patient and Parent Education method: Explanation, Demonstration, Tactile cues, and Verbal cues Education comprehension: returned demonstration, verbal cues required, tactile cues required, and needs further education   HOME EXERCISE PROGRAM: Access Code: [email or web address] URL: [email or web address] Date: 06/11/2023 Prepared by: Massie Dollar  Exercises - Standing March with Counter Support  - 1 x daily - 7 x weekly - 2 sets - 8 reps - Side Stepping with Counter Support  - 1 x daily - 7 x weekly - 2 sets - 4 reps - Sit to Stand with Armchair  - 1 x daily - 7 x weekly - 2 sets - 5 reps - Seated March  - 1 x daily - 7 x weekly - 3 sets - 10 reps  GOALS: Goals reviewed with patient? Yes   SHORT TERM GOALS: Target date: 07/11/2023  Patient will be independent in home exercise program to improve strength/mobility for better functional independence with ADLs. Baseline: Goal status: INITIAL   LONG TERM GOALS: Target date: 02/06/2024  1.  Patient will complete five times sit to stand test in < 35 seconds indicating an increased LE strength and improved balance. Baseline: previously 37 sec with UUE 04/19/22 today 05/30/23: 1 min 43 seconds; 6/3: 41 sec with improved technique (use of BUE on armrests);  08/21/2023= 38 sec with some distractibility and  VC for hand placement 7:23: 39.06 (41.23, 36.89 sec) 10/29/23: 56.85 with HHA; 1 min 16 sec with B UE on armrests 11/14/2023= 32.68 sec with BUE Support (VC for consistency of hand position)  12/05/23: 1 min 40 sec with R UE support (VC for proper technique) Goal status: IN PROGRESS  2. Patient will demo ability to ambulate > 400 ft w/ LRAD versus hand held assist min assist to demonstrate improved household mobility and short community distances.  Baseline: 4/28: 115ft with RW. Mod assist for AD management to prevent veer to the R. 6/3: ~ 300  ft with RW, requires cuing throughout for proximity to RW particularly with turns, but this has improved compared to eval;  08/21/2023= Patient ambulated approx 300 feet with RW- Sill some min assist at times to navigate walker and VC to remain close to walker.  7/23: 354ft with RW and min assist for safety and moderate instruction for safety AD intermittently, especially with fatigue  10/29/23:  Pt able to ambulate 500' with RW, however required modA at times to keep the walker stable 11/14/2023= Will assess next visit 12/05/23: Pt able to ambulate ~300' with RW and modA from therapist to keep the walker close/stable Goal status: IN PROGRESS  3.  Patient will increase 10 meter walk test to >1.34m/s as to improve gait speed for better community ambulation and to reduce fall risk. Baseline: previously 0.36 m/s on 04/19/22 : 0.25 m/s;  07/10/23: 0. 24 m/s with RW;  08/15/2023= 0.39 m/s  with RW yet max VC for RW navigation.  7/23:Average Normal speed with RW: 0.25 m/s Average Normal speed with HHA: 0.59m/s 10/29/23: Average Normal speed with RW: 0.23 m/s 11/14/2023= 0.28 m/s with RW (VC for navigation); 0.45 m/s with HHA 12/05/23: 0.18 m/s; 53.95 sec with RW  Goal status: PROGRESSING  4.  Patient will reduce timed up and go (TUG) to <50 seconds to reduce fall risk and demonstrate improved transfer/gait ability Baseline: previously 91 sec with RW on 04/19/22; 05/30/23: 1 min 30 sec with RW 07/19/2023: 48.265 seconds using RW with skilled min A primarily for AD management with pt having difficulty turning with AD;  08/21/2023- 33 sec with HHA and 1 min 25 sec with RW (Max VC for safety with turning back to seat and to reach back safely)  7/23:42.18sec with RW and min assist for safety in turn; 40.35 sec with HHA and min assist for safety with turns.  10/29/23: TBD 11/14/2023= 43 sec with RW with difficulty turning 180 at end and max VC to reach back; 31 sec with HHA today. VC for turning.  Goal status: MET  5.  Pt will exhibit correct and safe technique with stand>sit into a standard chair or transport chair to reduce risk of falling.   Baseline: pt current technique unsafe, poor motor planning; 6/3: pt with improved technique, bring RW closer to chair prior to sitting; 08/21/2023- Patient able to perform with CGA yet inconsistent with cues for safety and consistency with hand placement.  10/29/23: Pt still with inconsistent STS's, utilizing heavy UE assistance  11/14/2023= Patient continues to be inconsistent but performed around 10 times today and able to reach back and push up using UE appropriately 7/10 times.  Goal status: PARTIALLY MET   6. The pt will demonstrate at least a 15 point improvement on SIS-16 to indicate increased ease with ADLs. Baseline: 45 (filled out by pt's mother); 42; 08/21/2023= 46; 09/03/2023: 46/80 10/29/23: TBD 11/19/2023- Mother not present so will assess  next visit she is present. Goal status: ONGOING   ASSESSMENT:  CLINICAL IMPRESSION:   Pt responded well to the exercises today and was able to try retro ambulation on the LiteGait.  Pt performed well with the task and it forced her to take larger steps at an increased cadence that she would not normally perform.  Pt has made improvements over the past couple of sessions and will likely benefit from LiteGait use over the next several sessions.   Pt will continue to benefit from skilled therapy to address remaining deficits in order to improve overall QoL and return to PLOF.        OBJECTIVE IMPAIRMENTS: Abnormal gait, decreased activity tolerance, decreased balance, decreased coordination, decreased endurance, decreased knowledge of use of DME, decreased mobility, difficulty walking, decreased strength, decreased safety awareness, increased edema, impaired tone, improper body mechanics, and postural dysfunction.   ACTIVITY LIMITATIONS: carrying, lifting, bending, standing, squatting, stairs, transfers, bathing, dressing,  locomotion level, and caring for others  PARTICIPATION LIMITATIONS: meal prep, cleaning, laundry, medication management, personal finances, driving, shopping, community activity, occupation, and yard work  PERSONAL FACTORS: Sex, Time since onset of injury/illness/exacerbation, and 1-2 comorbidities: per chart PMH includes CAD, HTN, ischemic cardiomyopathy, STEMI, ventricular fibrillation, dysphagia, s/p ICD, apraxia  are also affecting patient's functional outcome.   REHAB POTENTIAL: Fair    CLINICAL DECISION MAKING: Evolving/moderate complexity  EVALUATION COMPLEXITY: High  PLAN:  PT FREQUENCY: 1-2x/week  PT DURATION: 12 weeks  PLANNED INTERVENTIONS: 97164- PT Re-evaluation, 97750- Physical Performance Testing, 97110-Therapeutic exercises, 97530- Therapeutic activity, 97112- Neuromuscular re-education, 97535- Self Care, [id number]- Manual therapy, [phone]- Gait training, [phone]- Orthotic Initial, [phone]- Orthotic/Prosthetic subsequent, [phone]- Canalith repositioning, Patient/Family education, Balance training, Stair training, Taping, Joint mobilization, Spinal mobilization, Vestibular training, DME instructions, Wheelchair mobility training, Cryotherapy, and Moist heat   PLAN FOR NEXT SESSION:   backwards gait training using RW with skilled assistance - perform in environment with limited distractions to allow improved motor learning Transfer training - learning sequence of AD management and stepping back to seat Gait training using RW in home-like environment Dynamic balance Reaching with use of Blaze Pods for tapping hands    Fonda Simpers, PT, DPT Physical Therapist - [facility]  12/30/23, 8:57 PM

## 2023-12-31 ENCOUNTER — Telehealth: Payer: Self-pay

## 2023-12-31 ENCOUNTER — Ambulatory Visit

## 2023-12-31 NOTE — Telephone Encounter (Signed)
 Copied from CRM [id number]. Topic: Clinical - Medical Advice >> Dec 31, 2023  4:37 PM Rea ORN wrote: Reason for CRM: Jerylyn with [facility] called to advise the chart notes they received for this pt are incomplete. She also mentioned that they do not state that the pt is incontinent and in need of incontinence supplies. She is asking for PCP to correct this in order for the pt's insurance to cover supplies.   Please return call [phone]

## 2024-01-01 DIAGNOSIS — N3944 Nocturnal enuresis: Secondary | ICD-10-CM | POA: Insufficient documentation

## 2024-01-01 DIAGNOSIS — R32 Unspecified urinary incontinence: Secondary | ICD-10-CM | POA: Insufficient documentation

## 2024-01-01 NOTE — Telephone Encounter (Signed)
 For Review-ok to fill??

## 2024-01-01 NOTE — Assessment & Plan Note (Signed)
 Mother reports pt has incontinence at night, during the day time she has no incontinence episodes. Has been using purewick at night.

## 2024-01-01 NOTE — Telephone Encounter (Signed)
 Notes has been updated. Please fax last OV notes.

## 2024-01-02 ENCOUNTER — Ambulatory Visit: Admitting: Occupational Therapy

## 2024-01-02 ENCOUNTER — Ambulatory Visit

## 2024-01-02 NOTE — Telephone Encounter (Signed)
 We filled it last time for her. It is fine to go ahead a fill it again.

## 2024-01-02 NOTE — Telephone Encounter (Signed)
 Refaxed paperwork with Charanpreet Kaur's updated progress notes to [facility] at [phone] and received an ok confirmation.

## 2024-01-02 NOTE — Telephone Encounter (Signed)
 Noted

## 2024-01-07 ENCOUNTER — Ambulatory Visit

## 2024-01-07 ENCOUNTER — Ambulatory Visit: Attending: Physical Medicine & Rehabilitation | Admitting: Physical Therapy

## 2024-01-07 DIAGNOSIS — R2689 Other abnormalities of gait and mobility: Secondary | ICD-10-CM | POA: Diagnosis present

## 2024-01-07 DIAGNOSIS — R269 Unspecified abnormalities of gait and mobility: Secondary | ICD-10-CM | POA: Insufficient documentation

## 2024-01-07 DIAGNOSIS — R278 Other lack of coordination: Secondary | ICD-10-CM | POA: Insufficient documentation

## 2024-01-07 DIAGNOSIS — R262 Difficulty in walking, not elsewhere classified: Secondary | ICD-10-CM | POA: Diagnosis present

## 2024-01-07 DIAGNOSIS — R41841 Cognitive communication deficit: Secondary | ICD-10-CM | POA: Insufficient documentation

## 2024-01-07 DIAGNOSIS — G931 Anoxic brain damage, not elsewhere classified: Secondary | ICD-10-CM | POA: Diagnosis present

## 2024-01-07 DIAGNOSIS — R2681 Unsteadiness on feet: Secondary | ICD-10-CM | POA: Diagnosis present

## 2024-01-07 DIAGNOSIS — M6281 Muscle weakness (generalized): Secondary | ICD-10-CM | POA: Insufficient documentation

## 2024-01-07 NOTE — Therapy (Signed)
 OUTPATIENT PHYSICAL THERAPY TREATMENT   Patient Name: Beverly [name] MRN: [id number] DOB:1985-08-05, 38 y.o., female Today's Date: 01/07/2024  PCP: [facility] REFERRING PROVIDER: Babs Arthea DASEN, MD  END OF SESSION:   PT End of Session - 01/07/24 1455     Visit Number 47    Number of Visits 59    Date for Recertification  02/06/24    Authorization Type UHC Dual Complete; Medicaid secondary    Progress Note Due on Visit 40    PT Start Time 1403    PT Stop Time 1445    PT Time Calculation (min) 42 min    Equipment Utilized During Treatment Gait belt    Activity Tolerance Patient tolerated treatment well    Behavior During Therapy Flat affect;Impulsive;WFL for tasks assessed/performed          Past Medical History:  Diagnosis Date   Brain injury [facility])    CAD (coronary artery disease)    HLD (hyperlipidemia)    Hypertension    last pregnancy   Ischemic cardiomyopathy    STEMI (ST elevation myocardial infarction) (HCC) 09/2019   SCAD with aneurysmal dilation of proximal LAD.   Vaginal Pap smear, abnormal    when she was 38yo   Ventricular fibrillation (HCC) 09/23/2019   Past Surgical History:  Procedure Laterality Date   CARDIAC CATHETERIZATION     IR GASTROSTOMY TUBE MOD SED  10/07/2019   IR GASTROSTOMY TUBE REMOVAL  07/02/2020   IR REPLACE G-TUBE SIMPLE WO FLUORO  09/28/2023   IR REPLC GASTRO/COLONIC TUBE PERCUT W/FLUORO  11/03/2021   IR REPLC GASTRO/COLONIC TUBE PERCUT W/FLUORO  03/09/2023   LEFT HEART CATH AND CORONARY ANGIOGRAPHY N/A 09/23/2019   Procedure: LEFT HEART CATH AND CORONARY ANGIOGRAPHY;  Surgeon: Mady [name], MD;  Location: [facility] INVASIVE CV LAB;  Service: Cardiovascular;  Laterality: N/A;   SUBQ ICD IMPLANT N/A 11/26/2020   Procedure: SUBQ ICD IMPLANT;  Surgeon: [name], MD;  Location: [facility] INVASIVE CV LAB;  Service: Cardiovascular;  Laterality: N/A;   Patient Active Problem List   Diagnosis Date Noted    Nocturnal enuresis 01/01/2024   Hyperlipidemia 12/01/2023   Apraxia 10/05/2021   S/P ICD (internal cardiac defibrillator) procedure 11/27/2020   Hx of Cardiac arrest (HCC) 11/26/2020   Abnormality of gait 03/16/2020   Sleep disturbance 03/16/2020   Oropharyngeal dysphagia 02/12/2020   Coronary artery disease involving native coronary artery of native heart without angina pectoris 12/12/2019   Ischemic cardiomyopathy 12/12/2019   Brain injury (HCC)    Prediabetes    Anoxic brain injury (HCC) 11/05/2019   Dysphagia 11/05/2019   Physical deconditioning 11/05/2019   Acute delirium 11/05/2019   Respiratory failure (HCC)    Encounter for central line placement    Ventricular fibrillation (HCC) 09/23/2019   Acute combined systolic and diastolic heart failure (HCC) 09/23/2019   Ventricular tachycardia, polymorphic (HCC) 09/23/2019   Pelvic pain affecting pregnancy 10/15/2015   Supervision of normal pregnancy in third trimester 09/28/2015   Poor weight gain of pregnancy 09/28/2015   Iron  deficiency anemia of pregnancy 09/01/2015   Increased BMI (body mass index) 07/29/2015    ONSET DATE: August 2021  REFERRING DIAG:  G93.1 (ICD-10-CM) - Anoxic brain injury (HCC)  R48.2 (ICD-10-CM) - Apraxia    THERAPY DIAG:   Muscle weakness (generalized)  Other lack of coordination  Anoxic brain injury (HCC)  Difficulty in walking, not elsewhere classified  Abnormality of gait and mobility  Unsteadiness on feet  Other abnormalities of gait and mobility  Rationale for Evaluation and Treatment: Rehabilitation  SUBJECTIVE:                                                                                                                                                                                             SUBJECTIVE STATEMENT:   Pt denies any new complaints upon arrival.   Pt accompanied by: self, mother in waiting room   PERTINENT HISTORY:   Patient is a 38 year old with  diagnois of anoxic brain injury. Pt known to PT clinic. Previously seen for outpatient services March 2024. Pt was receiving HH PT until October 2024 per her mother's report. [name] reports pt using HHA for ambulation, was using RW but she says was told not to do to difficulty with safe technique. Pt has fallen 1x in the past six months trying to ambulate on her own. Pt requires assistance with ADLs, including bathing and dressing. [name] reports pt never indicates any pain. Pt with severe aphasia so is unable to answer questions/pt's mom provides hx. She reports pt has impaired short-term memory, difficulty with stair climbing (has stair lift at home). She reports pt's BLE are weaker than they used to be. The pt is probably limited to <10 min with ambulation due to poor endurance. Pt reports her goal is to be able to use an AD to achieve mod-I mobility rather than relying on someone's HHA to ambulate.She was receiving outpatient PT until Feb. 2022 last year but ran out of visits. Patient has past medical history of Anoxic Brain Injury 09/2019, CAD, HLD, HTN, ISCHEMIC CARDIMYOPATHY, STEMI, V-FIB, ICD on 11/29/2020. Patient is currently living with mom. Mother reports she is abel to walk some with a walker with supervision but primarily ambulates with HHA of mother who is her guardian. Mother also reports she requires assist for all transfers and ADL's.   PAIN:  Are you having pain? No  PRECAUTIONS: Fall  WEIGHT BEARING RESTRICTIONS: No  FALLS: Has patient fallen in last 6 months? Yes. Number of falls 1  LIVING ENVIRONMENT: Lives with: lives with their family, mother Lives in: House/apartment Stairs: has stairs but stair lift installed, pt using lift Has following equipment at home: Environmental [employer]ant - 2 wheeled, Wheelchair (manual), shower chair, and Grab bars, pt uses stair lift  PLOF: Needs assistance with ADLs  PATIENT GOALS: balance, walking, and pt states basketball  OBJECTIVE:  Note:  Objective measures were completed at Evaluation unless otherwise noted.  DIAGNOSTIC FINDINGS:  None recent in chart  COGNITION: Overall cognitive status: impaired   SENSATION: Unable to complete testing  due to impaired cognition, pt unable to report if sensation impairment present, pt mother unsure   EDEMA:  Mom reports pt's feet do swell from time to time   LOWER EXTREMITY MMT:  *accuracy of testing likely impacted by pt difficulty following cues  MMT Right Eval Left Eval  Hip flexion 4+ 4+  Hip extension    Hip abduction 4+ 4+  Hip adduction 4+ 4+  Hip internal rotation    Hip external rotation    Knee flexion 4+ 4+  Knee extension 5 5  Ankle dorsiflexion    Ankle plantarflexion    Ankle inversion    Ankle eversion    (Blank rows = not tested)  TRANSFERS: HHA  tried in session, very unsteady, pt has difficulty with motor planning, little success with cues due to distraction, very unsafe Attempted in session with RW, pt unsteady, gait ataxic, continued significant difficulty with motor planning requiring up to min a + 2 and multi-modal cues to safely sit in chair.   FUNCTIONAL TESTS:  5 times sit to stand: 1 min 43 sec Timed up and go (TUG): 1 min 30 sec with RW 10 meter walk test: 0.25 m/s with RW extensive cuing to complete activity  PATIENT SURVEYS:  SIS-16 filled out by pt's mother, used as questions relevant to pt: 45                                                                                                                              TREATMENT DATE: 01/07/2024 *Unless otherwise stated, CGA/min A was provided and gait belt donned in order to ensure pt safety throughout session.*  *Pt arrived to therapy session in transport chair with mother accompanying her in waiting area.* Transfer from transport chair into green arm rest chair, mod assist from SPT for transition Overground ambulation, 1 lap, 138ft using 2WW  Seated break Overground ambulation, 1 lap,  141ft using 2WW w/ BTB tied posterior to pt LE for tactile feedback for close body placement within walker during ambulation Seated break Overground ambulation, 1 lap, 127ft using 2WW w/ BTB and 5# AW donned to each side of walker to decrease distance between pt and walker Seated break Sit<>stand w/ hand held assist from SPT, 2x5, verbal and tactile cue for placement of feet, and upright posture Transport from green arm rest chair to transport chair using sit<>stand and pivot with 2WW with mod assist from PT and verbal cues for proper pivot technique and transition Pt wheeled out to mother in waiting area.   PATIENT EDUCATION: Education details: goal reassessment, indications, exercise technique Person educated: Patient and Parent Education method: Explanation, Demonstration, Tactile cues, and Verbal cues Education comprehension: returned demonstration, verbal cues required, tactile cues required, and needs further education   HOME EXERCISE PROGRAM: Access Code: [email or web address] URL: [email or web address] Date: 06/11/2023 Prepared by: Massie Dollar  Exercises - Standing March with Counter Support  - 1 x daily - 7 x weekly -  2 sets - 8 reps - Side Stepping with Counter Support  - 1 x daily - 7 x weekly - 2 sets - 4 reps - Sit to Stand with Armchair  - 1 x daily - 7 x weekly - 2 sets - 5 reps - Seated March  - 1 x daily - 7 x weekly - 3 sets - 10 reps  GOALS: Goals reviewed with patient? Yes   SHORT TERM GOALS: Target date: 07/11/2023  Patient will be independent in home exercise program to improve strength/mobility for better functional independence with ADLs. Baseline: Goal status: INITIAL   LONG TERM GOALS: Target date: 02/06/2024  1.  Patient will complete five times sit to stand test in < 35 seconds indicating an increased LE strength and improved balance. Baseline: previously 37 sec with UUE 04/19/22 today 05/30/23: 1 min 43 seconds; 6/3: 41 sec with improved technique  (use of BUE on armrests);  08/21/2023= 38 sec with some distractibility and VC for hand placement 7:23: 39.06 (41.23, 36.89 sec) 10/29/23: 56.85 with HHA; 1 min 16 sec with B UE on armrests 11/14/2023= 32.68 sec with BUE Support (VC for consistency of hand position)  12/05/23: 1 min 40 sec with R UE support (VC for proper technique) Goal status: IN PROGRESS  2. Patient will demo ability to ambulate > 400 ft w/ LRAD versus hand held assist min assist to demonstrate improved household mobility and short community distances.  Baseline: 4/28: 171ft with RW. Mod assist for AD management to prevent veer to the R. 6/3: ~ 300 ft with RW, requires cuing throughout for proximity to RW particularly with turns, but this has improved compared to eval;  08/21/2023= Patient ambulated approx 300 feet with RW- Sill some min assist at times to navigate walker and VC to remain close to walker.  7/23: 329ft with RW and min assist for safety and moderate instruction for safety AD intermittently, especially with fatigue  10/29/23:  Pt able to ambulate 500' with RW, however required modA at times to keep the walker stable 11/14/2023= Will assess next visit 12/05/23: Pt able to ambulate ~300' with RW and modA from therapist to keep the walker close/stable Goal status: IN PROGRESS  3.  Patient will increase 10 meter walk test to >1.32m/s as to improve gait speed for better community ambulation and to reduce fall risk. Baseline: previously 0.36 m/s on 04/19/22 : 0.25 m/s;  07/10/23: 0. 24 m/s with RW;  08/15/2023= 0.39 m/s  with RW yet max VC for RW navigation.  7/23:Average Normal speed with RW: 0.25 m/s Average Normal speed with HHA: 0.44m/s 10/29/23: Average Normal speed with RW: 0.23 m/s 11/14/2023= 0.28 m/s with RW (VC for navigation); 0.45 m/s with HHA 12/05/23: 0.18 m/s; 53.95 sec with RW  Goal status: PROGRESSING  4.  Patient will reduce timed up and go (TUG) to <50 seconds to reduce fall risk and demonstrate improved  transfer/gait ability Baseline: previously 91 sec with RW on 04/19/22; 05/30/23: 1 min 30 sec with RW 07/19/2023: 48.265 seconds using RW with skilled min A primarily for AD management with pt having difficulty turning with AD;  08/21/2023- 33 sec with HHA and 1 min 25 sec with RW (Max VC for safety with turning back to seat and to reach back safely)  7/23:42.18sec with RW and min assist for safety in turn; 40.35 sec with HHA and min assist for safety with turns.  10/29/23: TBD 11/14/2023= 43 sec with RW with difficulty turning 180 at end  and max VC to reach back; 31 sec with HHA today. VC for turning.  Goal status: MET  5. Pt will exhibit correct and safe technique with stand>sit into a standard chair or transport chair to reduce risk of falling.   Baseline: pt current technique unsafe, poor motor planning; 6/3: pt with improved technique, bring RW closer to chair prior to sitting; 08/21/2023- Patient able to perform with CGA yet inconsistent with cues for safety and consistency with hand placement.  10/29/23: Pt still with inconsistent STS's, utilizing heavy UE assistance  11/14/2023= Patient continues to be inconsistent but performed around 10 times today and able to reach back and push up using UE appropriately 7/10 times.  Goal status: PARTIALLY MET   6. The pt will demonstrate at least a 15 point improvement on SIS-16 to indicate increased ease with ADLs. Baseline: 45 (filled out by pt's mother); 42; 08/21/2023= 46; 09/03/2023: 46/80 10/29/23: TBD 11/19/2023- Mother not present so will assess next visit she is present. Goal status: ONGOING   ASSESSMENT:  CLINICAL IMPRESSION:   Pt continued to show difficulty with chair<>chair transitions and turning with 2WW during initial transfer from transfer chair to seated arm chair, more so in today's session due to improper chair placement for optimal ease of transition. First lap of ambulation showed increased difficulty in keeping walker closer to body  and with placement of feet within walker. Pt continued to require verbal and tactile cuing in order to limit significant distraction and keep pt on task. Second bout of ambulation showed significant improvement with tactile feedback from BTB tied behind patient in keeping feet within walker and limiting distance between pt and AD. Pt showed good performance of sit<>stands in today's session, showing great power of movement, but occasionally requiring hand held assist prior to movement, with occasional postural sway upon stance from chair, requiring mod assist from SPT for correction. Transfer back into transport chair following session required mod assist from PT for proper mechanics and sequencing back into chair. Pt would benefit from continued turning and transitional movement training, improving pt ability to walk backwards to improve ability to safely sit down. Pt will continue to benefit from skilled therapy to address remaining deficits in order to improve overall QoL and return to PLOF.     OBJECTIVE IMPAIRMENTS: Abnormal gait, decreased activity tolerance, decreased balance, decreased coordination, decreased endurance, decreased knowledge of use of DME, decreased mobility, difficulty walking, decreased strength, decreased safety awareness, increased edema, impaired tone, improper body mechanics, and postural dysfunction.   ACTIVITY LIMITATIONS: carrying, lifting, bending, standing, squatting, stairs, transfers, bathing, dressing, locomotion level, and caring for others  PARTICIPATION LIMITATIONS: meal prep, cleaning, laundry, medication management, personal finances, driving, shopping, community activity, occupation, and yard work  PERSONAL FACTORS: Sex, Time since onset of injury/illness/exacerbation, and 1-2 comorbidities: per chart PMH includes CAD, HTN, ischemic cardiomyopathy, STEMI, ventricular fibrillation, dysphagia, s/p ICD, apraxia  are also affecting patient's functional outcome.    REHAB POTENTIAL: Fair    CLINICAL DECISION MAKING: Evolving/moderate complexity  EVALUATION COMPLEXITY: High  PLAN:  PT FREQUENCY: 1-2x/week  PT DURATION: 12 weeks  PLANNED INTERVENTIONS: 97164- PT Re-evaluation, 97750- Physical Performance Testing, 97110-Therapeutic exercises, 97530- Therapeutic activity, [id number]- Neuromuscular re-education, 97535- Self Care, [id number]- Manual therapy, [id number]- Gait training, [phone]- Orthotic Initial, [phone]- Orthotic/Prosthetic subsequent, [phone]- Canalith repositioning, Patient/Family education, Balance training, Stair training, Taping, Joint mobilization, Spinal mobilization, Vestibular training, DME instructions, Wheelchair mobility training, Cryotherapy, and Moist heat   PLAN FOR NEXT SESSION:   backwards gait  training using RW with skilled assistance - perform in environment with limited distractions to allow improved motor learning Transfer training- learning sequence of AD management and stepping back to seat Gait training using RW in home-like environment Dynamic balance Reaching with use of Blaze Pods for tapping hands    Renna Helling, SPT  01/07/24, 2:57 PM

## 2024-01-07 NOTE — Telephone Encounter (Signed)
 Refill sent as requested.

## 2024-01-08 ENCOUNTER — Telehealth: Payer: Self-pay

## 2024-01-08 NOTE — Telephone Encounter (Signed)
 Copied from CRM #[id number]. Topic: Clinical - Medication Question >> Jan 08, 2024  3:46 PM Beverly [name] wrote: Reason for CRM: Patients mother called in stating that she had a question for the nurse in regards to her daughters medication. Patient is requesting a callback and can be reached at [phone].

## 2024-01-09 ENCOUNTER — Ambulatory Visit

## 2024-01-09 DIAGNOSIS — R2681 Unsteadiness on feet: Secondary | ICD-10-CM

## 2024-01-09 DIAGNOSIS — R262 Difficulty in walking, not elsewhere classified: Secondary | ICD-10-CM

## 2024-01-09 DIAGNOSIS — R269 Unspecified abnormalities of gait and mobility: Secondary | ICD-10-CM

## 2024-01-09 DIAGNOSIS — M6281 Muscle weakness (generalized): Secondary | ICD-10-CM | POA: Diagnosis not present

## 2024-01-09 DIAGNOSIS — R278 Other lack of coordination: Secondary | ICD-10-CM

## 2024-01-09 DIAGNOSIS — R41841 Cognitive communication deficit: Secondary | ICD-10-CM

## 2024-01-09 DIAGNOSIS — G931 Anoxic brain damage, not elsewhere classified: Secondary | ICD-10-CM

## 2024-01-09 DIAGNOSIS — R2689 Other abnormalities of gait and mobility: Secondary | ICD-10-CM

## 2024-01-09 NOTE — Telephone Encounter (Signed)
 Called the Patient's mother Myrick and she just need to know the name of the company for the [facility] so I let her know that it is [employer].

## 2024-01-09 NOTE — Therapy (Signed)
 OUTPATIENT PHYSICAL THERAPY TREATMENT   Patient Name: Beverly [name] MRN: [id number] DOB:[birth year], 38 y.o., female Today's Date: 01/09/2024  PCP: [facility] REFERRING PROVIDER: Babs Arthea DASEN, MD  END OF SESSION:   PT End of Session - 01/09/24 1402     Visit Number 48    Number of Visits 59    Date for Recertification  02/06/24    Authorization Type UHC Dual Complete; Medicaid secondary    Progress Note Due on Visit 40    PT Start Time 1403    PT Stop Time 1445    PT Time Calculation (min) 42 min    Equipment Utilized During Treatment Gait belt    Activity Tolerance Patient tolerated treatment well    Behavior During Therapy Flat affect;Impulsive;WFL for tasks assessed/performed          Past Medical History:  Diagnosis Date   Brain injury [facility])    CAD (coronary artery disease)    HLD (hyperlipidemia)    Hypertension    last pregnancy   Ischemic cardiomyopathy    STEMI (ST elevation myocardial infarction) (HCC) 09/2019   SCAD with aneurysmal dilation of proximal LAD.   Vaginal Pap smear, abnormal    when she was 38yo   Ventricular fibrillation (HCC) 09/23/2019   Past Surgical History:  Procedure Laterality Date   CARDIAC CATHETERIZATION     IR GASTROSTOMY TUBE MOD SED  10/07/2019   IR GASTROSTOMY TUBE REMOVAL  07/02/2020   IR REPLACE G-TUBE SIMPLE WO FLUORO  09/28/2023   IR REPLC GASTRO/COLONIC TUBE PERCUT W/FLUORO  11/03/2021   IR REPLC GASTRO/COLONIC TUBE PERCUT W/FLUORO  03/09/2023   LEFT HEART CATH AND CORONARY ANGIOGRAPHY N/A 09/23/2019   Procedure: LEFT HEART CATH AND CORONARY ANGIOGRAPHY;  Surgeon: Mady [name], MD;  Location: [facility] INVASIVE CV LAB;  Service: Cardiovascular;  Laterality: N/A;   SUBQ ICD IMPLANT N/A 11/26/2020   Procedure: SUBQ ICD IMPLANT;  Surgeon: [name], MD;  Location: [facility] INVASIVE CV LAB;  Service: Cardiovascular;  Laterality: N/A;   Patient Active Problem List   Diagnosis Date Noted    Nocturnal enuresis 01/01/2024   Hyperlipidemia 12/01/2023   Apraxia 10/05/2021   S/P ICD (internal cardiac defibrillator) procedure 11/27/2020   Hx of Cardiac arrest (HCC) 11/26/2020   Abnormality of gait 03/16/2020   Sleep disturbance 03/16/2020   Oropharyngeal dysphagia 02/12/2020   Coronary artery disease involving native coronary artery of native heart without angina pectoris 12/12/2019   Ischemic cardiomyopathy 12/12/2019   Brain injury (HCC)    Prediabetes    Anoxic brain injury (HCC) 11/05/2019   Dysphagia 11/05/2019   Physical deconditioning 11/05/2019   Acute delirium 11/05/2019   Respiratory failure (HCC)    Encounter for central line placement    Ventricular fibrillation (HCC) 09/23/2019   Acute combined systolic and diastolic heart failure (HCC) 09/23/2019   Ventricular tachycardia, polymorphic (HCC) 09/23/2019   Pelvic pain affecting pregnancy 10/15/2015   Supervision of normal pregnancy in third trimester 09/28/2015   Poor weight gain of pregnancy 09/28/2015   Iron  deficiency anemia of pregnancy 09/01/2015   Increased BMI (body mass index) 07/29/2015    ONSET DATE: August 2021  REFERRING DIAG:  G93.1 (ICD-10-CM) - Anoxic brain injury (HCC)  R48.2 (ICD-10-CM) - Apraxia    THERAPY DIAG:   Muscle weakness (generalized)  Other lack of coordination  Anoxic brain injury (HCC)  Difficulty in walking, not elsewhere classified  Abnormality of gait and mobility  Unsteadiness on feet  Other abnormalities of gait and mobility  Cognitive communication deficit  Rationale for Evaluation and Treatment: Rehabilitation  SUBJECTIVE:                                                                                                                                                                                             SUBJECTIVE STATEMENT:   Pt reports she had a good Thanksgiving.  Pt reports having more than 6 people at her house for Thanksgiving.  Pt  accompanied by: self, father   PERTINENT HISTORY:   Patient is a 38 year old with diagnois of anoxic brain injury. Pt known to PT clinic. Previously seen for outpatient services March 2024. Pt was receiving HH PT until October 2024 per her mother's report. [name] reports pt using HHA for ambulation, was using RW but she says was told not to do to difficulty with safe technique. Pt has fallen 1x in the past six months trying to ambulate on her own. Pt requires assistance with ADLs, including bathing and dressing. [name] reports pt never indicates any pain. Pt with severe aphasia so is unable to answer questions/pt's mom provides hx. She reports pt has impaired short-term memory, difficulty with stair climbing (has stair lift at home). She reports pt's BLE are weaker than they used to be. The pt is probably limited to <10 min with ambulation due to poor endurance. Pt reports her goal is to be able to use an AD to achieve mod-I mobility rather than relying on someone's HHA to ambulate.She was receiving outpatient PT until Feb. 2022 last year but ran out of visits. Patient has past medical history of Anoxic Brain Injury 09/2019, CAD, HLD, HTN, ISCHEMIC CARDIMYOPATHY, STEMI, V-FIB, ICD on 11/29/2020. Patient is currently living with mom. Mother reports she is abel to walk some with a walker with supervision but primarily ambulates with HHA of mother who is her guardian. Mother also reports she requires assist for all transfers and ADL's.   PAIN:  Are you having pain? No  PRECAUTIONS: Fall  WEIGHT BEARING RESTRICTIONS: No  FALLS: Has patient fallen in last 6 months? Yes. Number of falls 1  LIVING ENVIRONMENT: Lives with: lives with their family, mother Lives in: House/apartment Stairs: has stairs but stair lift installed, pt using lift Has following equipment at home: [employer] - 2 wheeled, Wheelchair (manual), shower chair, and Grab bars, pt uses stair lift  PLOF: Needs assistance with  ADLs  PATIENT GOALS: balance, walking, and pt states basketball  OBJECTIVE:  Note: Objective measures were completed at Evaluation unless otherwise noted.  DIAGNOSTIC FINDINGS:  None recent in chart  COGNITION: Overall cognitive status: impaired   SENSATION: Unable to complete testing due to impaired cognition, pt unable to report if sensation impairment present, pt mother unsure   EDEMA:  Mom reports pt's feet do swell from time to time   LOWER EXTREMITY MMT:  *accuracy of testing likely impacted by pt difficulty following cues  MMT Right Eval Left Eval  Hip flexion 4+ 4+  Hip extension    Hip abduction 4+ 4+  Hip adduction 4+ 4+  Hip internal rotation    Hip external rotation    Knee flexion 4+ 4+  Knee extension 5 5  Ankle dorsiflexion    Ankle plantarflexion    Ankle inversion    Ankle eversion    (Blank rows = not tested)  TRANSFERS: HHA  tried in session, very unsteady, pt has difficulty with motor planning, little success with cues due to distraction, very unsafe Attempted in session with RW, pt unsteady, gait ataxic, continued significant difficulty with motor planning requiring up to min a + 2 and multi-modal cues to safely sit in chair.   FUNCTIONAL TESTS:  5 times sit to stand: 1 min 43 sec Timed up and go (TUG): 1 min 30 sec with RW 10 meter walk test: 0.25 m/s with RW extensive cuing to complete activity  PATIENT SURVEYS:  SIS-16 filled out by pt's mother, used as questions relevant to pt: 45                                                                                                                              TREATMENT DATE: 01/09/2024  *Unless otherwise stated, CGA/min A was provided and gait belt donned in order to ensure pt safety throughout session.*   Donned LiteGait harness in seated position and adjusted in standing position at locked LiteGait   Gait overground with use of LiteGait in the gym to the treadmill, ~25'   Gait training  on treadmill; the following trials completed in litegait harness for safety, but not providing BWS (body weight support), with therapist cuing for body placement and keeping up with treadmill speed.   Round 1: B UE supported, pt ambulating backwards at -0.6 mph, and able to achieve 2 minutes before fatiguing   Round 2: B UE supported, pt ambulating at 0.8 mph, and able to achieve 3 min before fatiguing   Seated rest break utilized and pt was monitored throughout the rest break and was able to voice when ready to attempt again   Round 3: B UE supported, pt ambulating at 0.8 mph, and able to achieve 3 min before fatiguing  Round 4: B UE supported, pt ambulating backwards at 0.5 mph, and able to achieve 2 minutes before fatiguing   Litegait support with +1 assistance for safety and monitoring.  Pt noted to have foot clearance complications on the L LE > R LE.  Pt unable to voice a BORG scale after the session.  Doffed LiteGait harness  and pt assisted back to the transport chair with father escorting pt out.   PATIENT EDUCATION: Education details: goal reassessment, indications, exercise technique Person educated: Patient and Parent Education method: Explanation, Demonstration, Tactile cues, and Verbal cues Education comprehension: returned demonstration, verbal cues required, tactile cues required, and needs further education   HOME EXERCISE PROGRAM: Access Code: [email or web address] URL: [email or web address] Date: 06/11/2023 Prepared by: Massie Dollar  Exercises - Standing March with Counter Support  - 1 x daily - 7 x weekly - 2 sets - 8 reps - Side Stepping with Counter Support  - 1 x daily - 7 x weekly - 2 sets - 4 reps - Sit to Stand with Armchair  - 1 x daily - 7 x weekly - 2 sets - 5 reps - Seated March  - 1 x daily - 7 x weekly - 3 sets - 10 reps  GOALS: Goals reviewed with patient? Yes   SHORT TERM GOALS: Target date: 07/11/2023  Patient will be independent in  home exercise program to improve strength/mobility for better functional independence with ADLs. Baseline: Goal status: INITIAL   LONG TERM GOALS: Target date: 02/06/2024  1.  Patient will complete five times sit to stand test in < 35 seconds indicating an increased LE strength and improved balance. Baseline: previously 37 sec with UUE 04/19/22 today 05/30/23: 1 min 43 seconds; 6/3: 41 sec with improved technique (use of BUE on armrests);  08/21/2023= 38 sec with some distractibility and VC for hand placement 7:23: 39.06 (41.23, 36.89 sec) 10/29/23: 56.85 with HHA; 1 min 16 sec with B UE on armrests 11/14/2023= 32.68 sec with BUE Support (VC for consistency of hand position)  12/05/23: 1 min 40 sec with R UE support (VC for proper technique) Goal status: IN PROGRESS  2. Patient will demo ability to ambulate > 400 ft w/ LRAD versus hand held assist min assist to demonstrate improved household mobility and short community distances.  Baseline: 4/28: 169ft with RW. Mod assist for AD management to prevent veer to the R. 6/3: ~ 300 ft with RW, requires cuing throughout for proximity to RW particularly with turns, but this has improved compared to eval;  08/21/2023= Patient ambulated approx 300 feet with RW- Sill some min assist at times to navigate walker and VC to remain close to walker.  7/23: 365ft with RW and min assist for safety and moderate instruction for safety AD intermittently, especially with fatigue  10/29/23:  Pt able to ambulate 500' with RW, however required modA at times to keep the walker stable 11/14/2023= Will assess next visit 12/05/23: Pt able to ambulate ~300' with RW and modA from therapist to keep the walker close/stable Goal status: IN PROGRESS  3.  Patient will increase 10 meter walk test to >1.59m/s as to improve gait speed for better community ambulation and to reduce fall risk. Baseline: previously 0.36 m/s on 04/19/22 : 0.25 m/s;  07/10/23: 0. 24 m/s with RW;  08/15/2023= 0.39  m/s  with RW yet max VC for RW navigation.  7/23:Average Normal speed with RW: 0.25 m/s Average Normal speed with HHA: 0.8m/s 10/29/23: Average Normal speed with RW: 0.23 m/s 11/14/2023= 0.28 m/s with RW (VC for navigation); 0.45 m/s with HHA 12/05/23: 0.18 m/s; 53.95 sec with RW  Goal status: PROGRESSING  4.  Patient will reduce timed up and go (TUG) to <50 seconds to reduce fall risk and demonstrate improved transfer/gait ability Baseline: previously 91 sec with RW on 04/19/22; 05/30/23: 1 min 30  sec with RW 07/19/2023: 48.265 seconds using RW with skilled min A primarily for AD management with pt having difficulty turning with AD;  08/21/2023- 33 sec with HHA and 1 min 25 sec with RW (Max VC for safety with turning back to seat and to reach back safely)  7/23:42.18sec with RW and min assist for safety in turn; 40.35 sec with HHA and min assist for safety with turns.  10/29/23: TBD 11/14/2023= 43 sec with RW with difficulty turning 180 at end and max VC to reach back; 31 sec with HHA today. VC for turning.  Goal status: MET  5. Pt will exhibit correct and safe technique with stand>sit into a standard chair or transport chair to reduce risk of falling.   Baseline: pt current technique unsafe, poor motor planning; 6/3: pt with improved technique, bring RW closer to chair prior to sitting; 08/21/2023- Patient able to perform with CGA yet inconsistent with cues for safety and consistency with hand placement.  10/29/23: Pt still with inconsistent STS's, utilizing heavy UE assistance  11/14/2023= Patient continues to be inconsistent but performed around 10 times today and able to reach back and push up using UE appropriately 7/10 times.  Goal status: PARTIALLY MET   6. The pt will demonstrate at least a 15 point improvement on SIS-16 to indicate increased ease with ADLs. Baseline: 45 (filled out by pt's mother); 42; 08/21/2023= 46; 09/03/2023: 46/80 10/29/23: TBD 11/19/2023- Mother not present so will  assess next visit she is present. Goal status: ONGOING   ASSESSMENT:  CLINICAL IMPRESSION:   Pt responded well to the treatment approach and was able to ambulate further distance during the treatment session today, specifically with the backwards ambulation.  Pt became more distracted at times, but ultimately improved in her ability to perform the tasks given.  Pt will continue to benefit from backwards ambulation in order to perform more fluid transfers into seated position.  Pt will continue to benefit from skilled therapy to address remaining deficits in order to improve overall QoL and return to PLOF.      OBJECTIVE IMPAIRMENTS: Abnormal gait, decreased activity tolerance, decreased balance, decreased coordination, decreased endurance, decreased knowledge of use of DME, decreased mobility, difficulty walking, decreased strength, decreased safety awareness, increased edema, impaired tone, improper body mechanics, and postural dysfunction.   ACTIVITY LIMITATIONS: carrying, lifting, bending, standing, squatting, stairs, transfers, bathing, dressing, locomotion level, and caring for others  PARTICIPATION LIMITATIONS: meal prep, cleaning, laundry, medication management, personal finances, driving, shopping, community activity, occupation, and yard work  PERSONAL FACTORS: Sex, Time since onset of injury/illness/exacerbation, and 1-2 comorbidities: per chart PMH includes CAD, HTN, ischemic cardiomyopathy, STEMI, ventricular fibrillation, dysphagia, s/p ICD, apraxia  are also affecting patient's functional outcome.   REHAB POTENTIAL: Fair    CLINICAL DECISION MAKING: Evolving/moderate complexity  EVALUATION COMPLEXITY: High  PLAN:  PT FREQUENCY: 1-2x/week  PT DURATION: 12 weeks  PLANNED INTERVENTIONS: 97164- PT Re-evaluation, 97750- Physical Performance Testing, 97110-Therapeutic exercises, 97530- Therapeutic activity, [id number]- Neuromuscular re-education, 97535- Self Care, 02859- Manual  therapy, [phone]- Gait training, [phone]- Orthotic Initial, [phone]- Orthotic/Prosthetic subsequent, [phone]- Canalith repositioning, Patient/Family education, Balance training, Stair training, Taping, Joint mobilization, Spinal mobilization, Vestibular training, DME instructions, Wheelchair mobility training, Cryotherapy, and Moist heat   PLAN FOR NEXT SESSION:   backwards gait training using RW with skilled assistance - perform in environment with limited distractions to allow improved motor learning Transfer training- learning sequence of AD management and stepping back to seat Gait training using RW in  home-like environment Dynamic balance Reaching with use of Blaze Pods for tapping hands   Fonda Simpers, PT, DPT Physical Therapist - [facility]  01/09/24, 3:30 PM

## 2024-01-10 ENCOUNTER — Telehealth: Payer: Self-pay

## 2024-01-10 DIAGNOSIS — G931 Anoxic brain damage, not elsewhere classified: Secondary | ICD-10-CM

## 2024-01-10 DIAGNOSIS — R1310 Dysphagia, unspecified: Secondary | ICD-10-CM

## 2024-01-10 NOTE — Telephone Encounter (Signed)
 Copied from CRM #[id number]. Topic: Clinical - Medication Question >> Jan 08, 2024  3:46 PM Mercedes MATSU wrote: Reason for CRM: Patients mother called in stating that she had a question for the nurse in regards to her daughters medication. Patient is requesting a callback and can be reached at [phone]. >> Jan 10, 2024  1:00 PM Drema MATSU wrote: Patient mom stated that pt case worker called patient mom yesterday and gave her the number to [facility] to get pt supplies. They are asking that pcp fax over a prescriptions for her Ensure Enlive Vanilla 350 all in one, 30 or 31 of the Enfit Neoconnect Tube Syringes, and 30 or 31 split sponges 2x2 that go around feeding tube.  Fax: [phone] [facility] [phone]

## 2024-01-10 NOTE — Progress Notes (Signed)
 Complex Care Management Note Care Guide Note  01/10/2024 Name: Beverly [name] MRN: [id number] DOB: Jul 24, 1985   Complex Care Management Outreach Attempts: An unsuccessful telephone outreach was attempted today to offer the patient information about available complex care management services.  Follow Up Plan:  Additional outreach attempts will be made to offer the patient complex care management information and services.   Encounter Outcome:  No Answer  Beverly [name] [facility]  [facility], [facility] VBCI Assistant Direct Dial: [phone]  Fax: [phone]

## 2024-01-10 NOTE — Progress Notes (Signed)
 Complex Care Management Note  Care Guide Note 01/10/2024 Name: Beverly [name] MRN: [id number] DOB: Aug 10, 1985  Beverly [name] is a 38 y.o. year old female who sees Vincente Saber, NP for primary care. I reached out to Beverly [name] by phone today to offer complex care management services.  Beverly [name] was given information about Complex Care Management services today including:   The Complex Care Management services include support from the care team which includes your Nurse Care Manager, Clinical Social Worker, or Pharmacist.  The Complex Care Management team is here to help remove barriers to the health concerns and goals most important to you. Complex Care Management services are voluntary, and the patient may decline or stop services at any time by request to their care team member.   Complex Care Management Consent Status: Patient agreed to services and verbal consent obtained.   Follow up plan:  Telephone appointment with complex care management team member scheduled for:  01/29/24 & 02/05/24.  Encounter Outcome:  Patient Scheduled  [facility]ly [name] [facility]  [facility], [facility] VBCI Assistant Direct Dial: [phone]  Fax: [phone]

## 2024-01-11 NOTE — Telephone Encounter (Signed)
 Please fax DME to [facility], Fax: [phone], Phone: [phone]

## 2024-01-11 NOTE — Addendum Note (Signed)
 Addended by: VINCENTE SABER on: 01/11/2024 04:00 PM   Modules accepted: Orders

## 2024-01-11 NOTE — Telephone Encounter (Signed)
 Noted

## 2024-01-11 NOTE — Telephone Encounter (Signed)
 Faxed DME order for nutritional supplies to [employer] [facility] per Patient's mother Myrick and the case worker.

## 2024-01-14 ENCOUNTER — Ambulatory Visit

## 2024-01-14 NOTE — Therapy (Incomplete)
 OUTPATIENT PHYSICAL THERAPY TREATMENT   Patient Name: Beverly [name] MRN: [id number] DOB:[birth year], 38 y.o., female Today's Date: 01/14/2024  PCP: [facility] REFERRING PROVIDER: Babs Arthea DASEN, MD  END OF SESSION:     Past Medical History:  Diagnosis Date   Brain injury [facility]    CAD (coronary artery disease)    HLD (hyperlipidemia)    Hypertension    last pregnancy   Ischemic cardiomyopathy    STEMI (ST elevation myocardial infarction) (HCC) 09/2019   SCAD with aneurysmal dilation of proximal LAD.   Vaginal Pap smear, abnormal    when she was 38yo   Ventricular fibrillation (HCC) 09/23/2019   Past Surgical History:  Procedure Laterality Date   CARDIAC CATHETERIZATION     IR GASTROSTOMY TUBE MOD SED  10/07/2019   IR GASTROSTOMY TUBE REMOVAL  07/02/2020   IR REPLACE G-TUBE SIMPLE WO FLUORO  09/28/2023   IR REPLC GASTRO/COLONIC TUBE PERCUT W/FLUORO  11/03/2021   IR REPLC GASTRO/COLONIC TUBE PERCUT W/FLUORO  03/09/2023   LEFT HEART CATH AND CORONARY ANGIOGRAPHY N/A 09/23/2019   Procedure: LEFT HEART CATH AND CORONARY ANGIOGRAPHY;  Surgeon: Mady [name], MD;  Location: [facility] INVASIVE CV LAB;  Service: Cardiovascular;  Laterality: N/A;   SUBQ ICD IMPLANT N/A 11/26/2020   Procedure: SUBQ ICD IMPLANT;  Surgeon: [name], MD;  Location: [facility] INVASIVE CV LAB;  Service: Cardiovascular;  Laterality: N/A;   Patient Active Problem List   Diagnosis Date Noted   Nocturnal enuresis 01/01/2024   Hyperlipidemia 12/01/2023   Apraxia 10/05/2021   S/P ICD (internal cardiac defibrillator) procedure 11/27/2020   Hx of Cardiac arrest (HCC) 11/26/2020   Abnormality of gait 03/16/2020   Sleep disturbance 03/16/2020   Oropharyngeal dysphagia 02/12/2020   Coronary artery disease involving native coronary artery of native heart without angina pectoris 12/12/2019   Ischemic cardiomyopathy 12/12/2019   Brain injury (HCC)    Prediabetes    Anoxic brain  injury (HCC) 11/05/2019   Dysphagia 11/05/2019   Physical deconditioning 11/05/2019   Acute delirium 11/05/2019   Respiratory failure (HCC)    Encounter for central line placement    Ventricular fibrillation (HCC) 09/23/2019   Acute combined systolic and diastolic heart failure (HCC) 09/23/2019   Ventricular tachycardia, polymorphic (HCC) 09/23/2019   Pelvic pain affecting pregnancy 10/15/2015   Supervision of normal pregnancy in third trimester 09/28/2015   Poor weight gain of pregnancy 09/28/2015   Iron  deficiency anemia of pregnancy 09/01/2015   Increased BMI (body mass index) 07/29/2015    ONSET DATE: August 2021  REFERRING DIAG:  G93.1 (ICD-10-CM) - Anoxic brain injury (HCC)  R48.2 (ICD-10-CM) - Apraxia    THERAPY DIAG:   No diagnosis found.  Rationale for Evaluation and Treatment: Rehabilitation  SUBJECTIVE:  SUBJECTIVE STATEMENT:   ***  Pt accompanied by: self, father   PERTINENT HISTORY:   Patient is a 38 year old with diagnois of anoxic brain injury. Pt known to PT clinic. Previously seen for outpatient services March 2024. Pt was receiving HH PT until October 2024 per her mother's report. [name] reports pt using HHA for ambulation, was using RW but she says was told not to do to difficulty with safe technique. Pt has fallen 1x in the past six months trying to ambulate on her own. Pt requires assistance with ADLs, including bathing and dressing. [name] reports pt never indicates any pain. Pt with severe aphasia so is unable to answer questions/pt's mom provides hx. She reports pt has impaired short-term memory, difficulty with stair climbing (has stair lift at home). She reports pt's BLE are weaker than they used to be. The pt is probably limited to <10 min with ambulation  due to poor endurance. Pt reports her goal is to be able to use an AD to achieve mod-I mobility rather than relying on someone's HHA to ambulate.She was receiving outpatient PT until Feb. 2022 last year but ran out of visits. Patient has past medical history of Anoxic Brain Injury 09/2019, CAD, HLD, HTN, ISCHEMIC CARDIMYOPATHY, STEMI, V-FIB, ICD on 11/29/2020. Patient is currently living with mom. Mother reports she is abel to walk some with a walker with supervision but primarily ambulates with HHA of mother who is her guardian. Mother also reports she requires assist for all transfers and ADL's.   PAIN:  Are you having pain? No  PRECAUTIONS: Fall  WEIGHT BEARING RESTRICTIONS: No  FALLS: Has patient fallen in last 6 months? Yes. Number of falls 1  LIVING ENVIRONMENT: Lives with: lives with their family, mother Lives in: House/apartment Stairs: has stairs but stair lift installed, pt using lift Has following equipment at home: [employer] - 2 wheeled, Wheelchair (manual), shower chair, and Grab bars, pt uses stair lift  PLOF: Needs assistance with ADLs  PATIENT GOALS: balance, walking, and pt states basketball  OBJECTIVE:  Note: Objective measures were completed at Evaluation unless otherwise noted.  DIAGNOSTIC FINDINGS:  None recent in chart  COGNITION: Overall cognitive status: impaired   SENSATION: Unable to complete testing due to impaired cognition, pt unable to report if sensation impairment present, pt mother unsure   EDEMA:  Mom reports pt's feet do swell from time to time   LOWER EXTREMITY MMT:  *accuracy of testing likely impacted by pt difficulty following cues  MMT Right Eval Left Eval  Hip flexion 4+ 4+  Hip extension    Hip abduction 4+ 4+  Hip adduction 4+ 4+  Hip internal rotation    Hip external rotation    Knee flexion 4+ 4+  Knee extension 5 5  Ankle dorsiflexion    Ankle plantarflexion    Ankle inversion    Ankle eversion    (Blank rows = not  tested)  TRANSFERS: HHA  tried in session, very unsteady, pt has difficulty with motor planning, little success with cues due to distraction, very unsafe Attempted in session with RW, pt unsteady, gait ataxic, continued significant difficulty with motor planning requiring up to min a + 2 and multi-modal cues to safely sit in chair.   FUNCTIONAL TESTS:  5 times sit to stand: 1 min 43 sec Timed up and go (TUG): 1 min 30 sec with RW 10 meter walk test: 0.25 m/s with RW extensive cuing to complete activity  PATIENT  SURVEYS:  SIS-16 filled out by pt's mother, used as questions relevant to pt: 45                                                                                                                              TREATMENT DATE: 01/14/2024  ***  *Unless otherwise stated, CGA/min A was provided and gait belt donned in order to ensure pt safety throughout session.*   Donned LiteGait harness in seated position and adjusted in standing position at locked LiteGait   Gait overground with use of LiteGait in the gym to the treadmill, ~25'   Gait training on treadmill; the following trials completed in litegait harness for safety, but not providing BWS (body weight support), with therapist cuing for body placement and keeping up with treadmill speed.   Round 1: B UE supported, pt ambulating backwards at -0.6 mph, and able to achieve 2 minutes before fatiguing   Round 2: B UE supported, pt ambulating at 0.8 mph, and able to achieve 3 min before fatiguing   Seated rest break utilized and pt was monitored throughout the rest break and was able to voice when ready to attempt again   Round 3: B UE supported, pt ambulating at 0.8 mph, and able to achieve 3 min before fatiguing  Round 4: B UE supported, pt ambulating backwards at 0.5 mph, and able to achieve 2 minutes before fatiguing   Litegait support with +1 assistance for safety and monitoring.  Pt noted to have foot clearance  complications on the L LE > R LE.  Pt unable to voice a BORG scale after the session.  Doffed LiteGait harness and pt assisted back to the transport chair with father escorting pt out.   PATIENT EDUCATION: Education details: goal reassessment, indications, exercise technique Person educated: Patient and Parent Education method: Explanation, Demonstration, Tactile cues, and Verbal cues Education comprehension: returned demonstration, verbal cues required, tactile cues required, and needs further education   HOME EXERCISE PROGRAM: Access Code: [email or web address] URL: [email or web address] Date: 06/11/2023 Prepared by: Massie Dollar  Exercises - Standing March with Counter Support  - 1 x daily - 7 x weekly - 2 sets - 8 reps - Side Stepping with Counter Support  - 1 x daily - 7 x weekly - 2 sets - 4 reps - Sit to Stand with Armchair  - 1 x daily - 7 x weekly - 2 sets - 5 reps - Seated March  - 1 x daily - 7 x weekly - 3 sets - 10 reps  GOALS: Goals reviewed with patient? Yes   SHORT TERM GOALS: Target date: 07/11/2023  Patient will be independent in home exercise program to improve strength/mobility for better functional independence with ADLs. Baseline: Goal status: INITIAL   LONG TERM GOALS: Target date: 02/06/2024  1.  Patient will complete five times sit to stand test in < 35 seconds indicating an increased LE strength and improved balance. Baseline: previously 37  sec with UUE 04/19/22 today 05/30/23: 1 min 43 seconds; 6/3: 41 sec with improved technique (use of BUE on armrests);  08/21/2023= 38 sec with some distractibility and VC for hand placement 7:23: 39.06 (41.23, 36.89 sec) 10/29/23: 56.85 with HHA; 1 min 16 sec with B UE on armrests 11/14/2023= 32.68 sec with BUE Support (VC for consistency of hand position)  12/05/23: 1 min 40 sec with R UE support (VC for proper technique) Goal status: IN PROGRESS  2. Patient will demo ability to ambulate > 400 ft w/ LRAD versus  hand held assist min assist to demonstrate improved household mobility and short community distances.  Baseline: 4/28: 149ft with RW. Mod assist for AD management to prevent veer to the R. 6/3: ~ 300 ft with RW, requires cuing throughout for proximity to RW particularly with turns, but this has improved compared to eval;  08/21/2023= Patient ambulated approx 300 feet with RW- Sill some min assist at times to navigate walker and VC to remain close to walker.  7/23: 344ft with RW and min assist for safety and moderate instruction for safety AD intermittently, especially with fatigue  10/29/23:  Pt able to ambulate 500' with RW, however required modA at times to keep the walker stable 11/14/2023= Will assess next visit 12/05/23: Pt able to ambulate ~300' with RW and modA from therapist to keep the walker close/stable Goal status: IN PROGRESS  3.  Patient will increase 10 meter walk test to >1.63m/s as to improve gait speed for better community ambulation and to reduce fall risk. Baseline: previously 0.36 m/s on 04/19/22 : 0.25 m/s;  07/10/23: 0. 24 m/s with RW;  08/15/2023= 0.39 m/s  with RW yet max VC for RW navigation.  7/23:Average Normal speed with RW: 0.25 m/s Average Normal speed with HHA: 0.35m/s 10/29/23: Average Normal speed with RW: 0.23 m/s 11/14/2023= 0.28 m/s with RW (VC for navigation); 0.45 m/s with HHA 12/05/23: 0.18 m/s; 53.95 sec with RW  Goal status: PROGRESSING  4.  Patient will reduce timed up and go (TUG) to <50 seconds to reduce fall risk and demonstrate improved transfer/gait ability Baseline: previously 91 sec with RW on 04/19/22; 05/30/23: 1 min 30 sec with RW 07/19/2023: 48.265 seconds using RW with skilled min A primarily for AD management with pt having difficulty turning with AD;  08/21/2023- 33 sec with HHA and 1 min 25 sec with RW (Max VC for safety with turning back to seat and to reach back safely)  7/23:42.18sec with RW and min assist for safety in turn; 40.35 sec with HHA and  min assist for safety with turns.  10/29/23: TBD 11/14/2023= 43 sec with RW with difficulty turning 180 at end and max VC to reach back; 31 sec with HHA today. VC for turning.  Goal status: MET  5. Pt will exhibit correct and safe technique with stand>sit into a standard chair or transport chair to reduce risk of falling.   Baseline: pt current technique unsafe, poor motor planning; 6/3: pt with improved technique, bring RW closer to chair prior to sitting; 08/21/2023- Patient able to perform with CGA yet inconsistent with cues for safety and consistency with hand placement.  10/29/23: Pt still with inconsistent STS's, utilizing heavy UE assistance  11/14/2023= Patient continues to be inconsistent but performed around 10 times today and able to reach back and push up using UE appropriately 7/10 times.  Goal status: PARTIALLY MET   6. The pt will demonstrate at least a 15 point improvement  on SIS-16 to indicate increased ease with ADLs. Baseline: 45 (filled out by pt's mother); 42; 08/21/2023= 46; 09/03/2023: 46/80 10/29/23: TBD 11/19/2023- Mother not present so will assess next visit she is present. Goal status: ONGOING   ASSESSMENT:  CLINICAL IMPRESSION:   ***    OBJECTIVE IMPAIRMENTS: Abnormal gait, decreased activity tolerance, decreased balance, decreased coordination, decreased endurance, decreased knowledge of use of DME, decreased mobility, difficulty walking, decreased strength, decreased safety awareness, increased edema, impaired tone, improper body mechanics, and postural dysfunction.   ACTIVITY LIMITATIONS: carrying, lifting, bending, standing, squatting, stairs, transfers, bathing, dressing, locomotion level, and caring for others  PARTICIPATION LIMITATIONS: meal prep, cleaning, laundry, medication management, personal finances, driving, shopping, community activity, occupation, and yard work  PERSONAL FACTORS: Sex, Time since onset of injury/illness/exacerbation, and 1-2  comorbidities: per chart PMH includes CAD, HTN, ischemic cardiomyopathy, STEMI, ventricular fibrillation, dysphagia, s/p ICD, apraxia  are also affecting patient's functional outcome.   REHAB POTENTIAL: Fair    CLINICAL DECISION MAKING: Evolving/moderate complexity  EVALUATION COMPLEXITY: High  PLAN:  PT FREQUENCY: 1-2x/week  PT DURATION: 12 weeks  PLANNED INTERVENTIONS: 97164- PT Re-evaluation, 97750- Physical Performance Testing, 97110-Therapeutic exercises, 97530- Therapeutic activity, 97112- Neuromuscular re-education, 97535- Self Care, [id number]- Manual therapy, [phone]- Gait training, [phone]- Orthotic Initial, [phone]- Orthotic/Prosthetic subsequent, [phone]- Canalith repositioning, Patient/Family education, Balance training, Stair training, Taping, Joint mobilization, Spinal mobilization, Vestibular training, DME instructions, Wheelchair mobility training, Cryotherapy, and Moist heat   PLAN FOR NEXT SESSION:  *** backwards gait training using RW with skilled assistance - perform in environment with limited distractions to allow improved motor learning Transfer training- learning sequence of AD management and stepping back to seat Gait training using RW in home-like environment Dynamic balance Reaching with use of Blaze Pods for tapping hands   Fonda Simpers, PT, DPT Physical Therapist - [facility]  [facility]  01/14/24, 7:59 AM

## 2024-01-16 ENCOUNTER — Ambulatory Visit: Admitting: Occupational Therapy

## 2024-01-16 ENCOUNTER — Ambulatory Visit

## 2024-01-16 DIAGNOSIS — R269 Unspecified abnormalities of gait and mobility: Secondary | ICD-10-CM

## 2024-01-16 DIAGNOSIS — R278 Other lack of coordination: Secondary | ICD-10-CM

## 2024-01-16 DIAGNOSIS — G931 Anoxic brain damage, not elsewhere classified: Secondary | ICD-10-CM

## 2024-01-16 DIAGNOSIS — R2689 Other abnormalities of gait and mobility: Secondary | ICD-10-CM

## 2024-01-16 DIAGNOSIS — M6281 Muscle weakness (generalized): Secondary | ICD-10-CM | POA: Diagnosis not present

## 2024-01-16 DIAGNOSIS — R2681 Unsteadiness on feet: Secondary | ICD-10-CM

## 2024-01-16 DIAGNOSIS — R262 Difficulty in walking, not elsewhere classified: Secondary | ICD-10-CM

## 2024-01-16 DIAGNOSIS — R41841 Cognitive communication deficit: Secondary | ICD-10-CM

## 2024-01-16 NOTE — Therapy (Signed)
 OUTPATIENT PHYSICAL THERAPY TREATMENT   Patient Name: Beverly [name] MRN: [id number] DOB:1985/05/12, 38 y.o., female Today's Date: 01/16/2024  PCP: [facility] REFERRING PROVIDER: Babs Arthea DASEN, MD  END OF SESSION:   PT End of Session - 01/16/24 1405     Visit Number 49    Number of Visits 59    Date for Recertification  02/06/24    Authorization Type UHC Dual Complete; Medicaid secondary    Progress Note Due on Visit 40    PT Start Time 1403    PT Stop Time 1445    PT Time Calculation (min) 42 min    Equipment Utilized During Treatment Gait belt    Activity Tolerance Patient tolerated treatment well    Behavior During Therapy Flat affect;Impulsive;WFL for tasks assessed/performed           Past Medical History:  Diagnosis Date   Brain injury [facility])    CAD (coronary artery disease)    HLD (hyperlipidemia)    Hypertension    last pregnancy   Ischemic cardiomyopathy    STEMI (ST elevation myocardial infarction) (HCC) 09/2019   SCAD with aneurysmal dilation of proximal LAD.   Vaginal Pap smear, abnormal    when she was 38yo   Ventricular fibrillation (HCC) 09/23/2019   Past Surgical History:  Procedure Laterality Date   CARDIAC CATHETERIZATION     IR GASTROSTOMY TUBE MOD SED  10/07/2019   IR GASTROSTOMY TUBE REMOVAL  07/02/2020   IR REPLACE G-TUBE SIMPLE WO FLUORO  09/28/2023   IR REPLC GASTRO/COLONIC TUBE PERCUT W/FLUORO  11/03/2021   IR REPLC GASTRO/COLONIC TUBE PERCUT W/FLUORO  03/09/2023   LEFT HEART CATH AND CORONARY ANGIOGRAPHY N/A 09/23/2019   Procedure: LEFT HEART CATH AND CORONARY ANGIOGRAPHY;  Surgeon: Mady [name], MD;  Location: [facility] INVASIVE CV LAB;  Service: Cardiovascular;  Laterality: N/A;   SUBQ ICD IMPLANT N/A 11/26/2020   Procedure: SUBQ ICD IMPLANT;  Surgeon: [name], MD;  Location: [facility] INVASIVE CV LAB;  Service: Cardiovascular;  Laterality: N/A;   Patient Active Problem List   Diagnosis Date Noted    Nocturnal enuresis 01/01/2024   Hyperlipidemia 12/01/2023   Apraxia 10/05/2021   S/P ICD (internal cardiac defibrillator) procedure 11/27/2020   Hx of Cardiac arrest (HCC) 11/26/2020   Abnormality of gait 03/16/2020   Sleep disturbance 03/16/2020   Oropharyngeal dysphagia 02/12/2020   Coronary artery disease involving native coronary artery of native heart without angina pectoris 12/12/2019   Ischemic cardiomyopathy 12/12/2019   Brain injury (HCC)    Prediabetes    Anoxic brain injury (HCC) 11/05/2019   Dysphagia 11/05/2019   Physical deconditioning 11/05/2019   Acute delirium 11/05/2019   Respiratory failure (HCC)    Encounter for central line placement    Ventricular fibrillation (HCC) 09/23/2019   Acute combined systolic and diastolic heart failure (HCC) 09/23/2019   Ventricular tachycardia, polymorphic (HCC) 09/23/2019   Pelvic pain affecting pregnancy 10/15/2015   Supervision of normal pregnancy in third trimester 09/28/2015   Poor weight gain of pregnancy 09/28/2015   Iron  deficiency anemia of pregnancy 09/01/2015   Increased BMI (body mass index) 07/29/2015    ONSET DATE: August 2021  REFERRING DIAG:  G93.1 (ICD-10-CM) - Anoxic brain injury (HCC)  R48.2 (ICD-10-CM) - Apraxia    THERAPY DIAG:   Muscle weakness (generalized)  Other lack of coordination  Anoxic brain injury (HCC)  Difficulty in walking, not elsewhere classified  Abnormality of gait and mobility  Unsteadiness on feet  Other abnormalities of gait and mobility  Cognitive communication deficit  Rationale for Evaluation and Treatment: Rehabilitation  SUBJECTIVE:                                                                                                                                                                                             SUBJECTIVE STATEMENT:   Pt reports that she's doing better and feeling better since she wasn't here on Tuesday.    Pt accompanied by: self,  father   PERTINENT HISTORY:   Patient is a 38 year old with diagnois of anoxic brain injury. Pt known to PT clinic. Previously seen for outpatient services March 2024. Pt was receiving HH PT until October 2024 per her mother's report. [name] reports pt using HHA for ambulation, was using RW but she says was told not to do to difficulty with safe technique. Pt has fallen 1x in the past six months trying to ambulate on her own. Pt requires assistance with ADLs, including bathing and dressing. [name] reports pt never indicates any pain. Pt with severe aphasia so is unable to answer questions/pt's mom provides hx. She reports pt has impaired short-term memory, difficulty with stair climbing (has stair lift at home). She reports pt's BLE are weaker than they used to be. The pt is probably limited to <10 min with ambulation due to poor endurance. Pt reports her goal is to be able to use an AD to achieve mod-I mobility rather than relying on someone's HHA to ambulate.She was receiving outpatient PT until Feb. 2022 last year but ran out of visits. Patient has past medical history of Anoxic Brain Injury 09/2019, CAD, HLD, HTN, ISCHEMIC CARDIMYOPATHY, STEMI, V-FIB, ICD on 11/29/2020. Patient is currently living with mom. Mother reports she is abel to walk some with a walker with supervision but primarily ambulates with HHA of mother who is her guardian. Mother also reports she requires assist for all transfers and ADL's.   PAIN:  Are you having pain? No  PRECAUTIONS: Fall  WEIGHT BEARING RESTRICTIONS: No  FALLS: Has patient fallen in last 6 months? Yes. Number of falls 1  LIVING ENVIRONMENT: Lives with: lives with their family, mother Lives in: House/apartment Stairs: has stairs but stair lift installed, pt using lift Has following equipment at home: Environmental [employer]ant - 2 wheeled, Wheelchair (manual), shower chair, and Grab bars, pt uses stair lift  PLOF: Needs assistance with ADLs  PATIENT GOALS:  balance, walking, and pt states basketball  OBJECTIVE:  Note: Objective measures were completed at Evaluation unless otherwise noted.  DIAGNOSTIC FINDINGS:  None recent in chart  COGNITION: Overall  cognitive status: impaired   SENSATION: Unable to complete testing due to impaired cognition, pt unable to report if sensation impairment present, pt mother unsure   EDEMA:  Mom reports pt's feet do swell from time to time   LOWER EXTREMITY MMT:  *accuracy of testing likely impacted by pt difficulty following cues  MMT Right Eval Left Eval  Hip flexion 4+ 4+  Hip extension    Hip abduction 4+ 4+  Hip adduction 4+ 4+  Hip internal rotation    Hip external rotation    Knee flexion 4+ 4+  Knee extension 5 5  Ankle dorsiflexion    Ankle plantarflexion    Ankle inversion    Ankle eversion    (Blank rows = not tested)  TRANSFERS: HHA  tried in session, very unsteady, pt has difficulty with motor planning, little success with cues due to distraction, very unsafe Attempted in session with RW, pt unsteady, gait ataxic, continued significant difficulty with motor planning requiring up to min a + 2 and multi-modal cues to safely sit in chair.   FUNCTIONAL TESTS:  5 times sit to stand: 1 min 43 sec Timed up and go (TUG): 1 min 30 sec with RW 10 meter walk test: 0.25 m/s with RW extensive cuing to complete activity  PATIENT SURVEYS:  SIS-16 filled out by pt's mother, used as questions relevant to pt: 45                                                                                                                              TREATMENT DATE: 01/16/2024   *Unless otherwise stated, CGA/min A was provided and gait belt donned in order to ensure pt safety throughout session.*   Donned LiteGait harness in seated position and adjusted in standing position at locked LiteGait   Gait overground around the gym with pt facing away from the LiteGait to simulate ambulation without an AD or  physical assistance, 150' bout  Rest break for ~2 minutes  Gait overground around the gym with pt facing towards the handles on the LiteGait, hallway throughout the run, pt ambulated backwards in the main hallway, then lateral ambulation for ~10 feet each direction, then finished the session with facing away from the machine and ambulating to the transport chair.  Pt very fatigued at the conclusion of the session with visible shaking of the LE's when going to sit down.   Doffed LiteGait harness and pt assisted back to the transport chair with father escorting pt out.   PATIENT EDUCATION: Education details: goal reassessment, indications, exercise technique Person educated: Patient and Parent Education method: Explanation, Demonstration, Tactile cues, and Verbal cues Education comprehension: returned demonstration, verbal cues required, tactile cues required, and needs further education   HOME EXERCISE PROGRAM: Access Code: [email or web address] URL: [email or web address] Date: 06/11/2023 Prepared by: Massie Dollar  Exercises - Standing March with Counter Support  - 1 x daily - 7 x weekly - 2 sets -  8 reps - Side Stepping with Counter Support  - 1 x daily - 7 x weekly - 2 sets - 4 reps - Sit to Stand with Armchair  - 1 x daily - 7 x weekly - 2 sets - 5 reps - Seated March  - 1 x daily - 7 x weekly - 3 sets - 10 reps  GOALS: Goals reviewed with patient? Yes   SHORT TERM GOALS: Target date: 07/11/2023  Patient will be independent in home exercise program to improve strength/mobility for better functional independence with ADLs. Baseline: Goal status: INITIAL   LONG TERM GOALS: Target date: 02/06/2024  1.  Patient will complete five times sit to stand test in < 35 seconds indicating an increased LE strength and improved balance. Baseline: previously 37 sec with UUE 04/19/22 today 05/30/23: 1 min 43 seconds; 6/3: 41 sec with improved technique (use of BUE on armrests);  08/21/2023=  38 sec with some distractibility and VC for hand placement 7:23: 39.06 (41.23, 36.89 sec) 10/29/23: 56.85 with HHA; 1 min 16 sec with B UE on armrests 11/14/2023= 32.68 sec with BUE Support (VC for consistency of hand position)  12/05/23: 1 min 40 sec with R UE support (VC for proper technique) Goal status: IN PROGRESS  2. Patient will demo ability to ambulate > 400 ft w/ LRAD versus hand held assist min assist to demonstrate improved household mobility and short community distances.  Baseline: 4/28: 11ft with RW. Mod assist for AD management to prevent veer to the R. 6/3: ~ 300 ft with RW, requires cuing throughout for proximity to RW particularly with turns, but this has improved compared to eval;  08/21/2023= Patient ambulated approx 300 feet with RW- Sill some min assist at times to navigate walker and VC to remain close to walker.  7/23: 380ft with RW and min assist for safety and moderate instruction for safety AD intermittently, especially with fatigue  10/29/23:  Pt able to ambulate 500' with RW, however required modA at times to keep the walker stable 11/14/2023= Will assess next visit 12/05/23: Pt able to ambulate ~300' with RW and modA from therapist to keep the walker close/stable Goal status: IN PROGRESS  3.  Patient will increase 10 meter walk test to >1.15m/s as to improve gait speed for better community ambulation and to reduce fall risk. Baseline: previously 0.36 m/s on 04/19/22 : 0.25 m/s;  07/10/23: 0. 24 m/s with RW;  08/15/2023= 0.39 m/s  with RW yet max VC for RW navigation.  7/23:Average Normal speed with RW: 0.25 m/s Average Normal speed with HHA: 0.68m/s 10/29/23: Average Normal speed with RW: 0.23 m/s 11/14/2023= 0.28 m/s with RW (VC for navigation); 0.45 m/s with HHA 12/05/23: 0.18 m/s; 53.95 sec with RW  Goal status: PROGRESSING  4.  Patient will reduce timed up and go (TUG) to <50 seconds to reduce fall risk and demonstrate improved transfer/gait ability Baseline:  previously 91 sec with RW on 04/19/22; 05/30/23: 1 min 30 sec with RW 07/19/2023: 48.265 seconds using RW with skilled min A primarily for AD management with pt having difficulty turning with AD;  08/21/2023- 33 sec with HHA and 1 min 25 sec with RW (Max VC for safety with turning back to seat and to reach back safely)  7/23:42.18sec with RW and min assist for safety in turn; 40.35 sec with HHA and min assist for safety with turns.  10/29/23: TBD 11/14/2023= 43 sec with RW with difficulty turning 180 at end and max VC  to reach back; 31 sec with HHA today. VC for turning.  Goal status: MET  5. Pt will exhibit correct and safe technique with stand>sit into a standard chair or transport chair to reduce risk of falling.   Baseline: pt current technique unsafe, poor motor planning; 6/3: pt with improved technique, bring RW closer to chair prior to sitting; 08/21/2023- Patient able to perform with CGA yet inconsistent with cues for safety and consistency with hand placement.  10/29/23: Pt still with inconsistent STS's, utilizing heavy UE assistance  11/14/2023= Patient continues to be inconsistent but performed around 10 times today and able to reach back and push up using UE appropriately 7/10 times.  Goal status: PARTIALLY MET   6. The pt will demonstrate at least a 15 point improvement on SIS-16 to indicate increased ease with ADLs. Baseline: 45 (filled out by pt's mother); 42; 08/21/2023= 46; 09/03/2023: 46/80 10/29/23: TBD 11/19/2023- Mother not present so will assess next visit she is present. Goal status: ONGOING   ASSESSMENT:  CLINICAL IMPRESSION:   Pt responded well to the exercises and for the first time was able to ambulate without any UE support while utilizing the LiteGait for safety, but no offloading provided by the LiteGait.  Pt very fatigued and with noticeable muscle quivering at the end of the session when going to sit down in the transport chair.  Pt will ultimately continue to improve  with the use of the LiteGait in order to ultimately attempt ambulation without UE support.   Pt will continue to benefit from skilled therapy to address remaining deficits in order to improve overall QoL and return to PLOF.       OBJECTIVE IMPAIRMENTS: Abnormal gait, decreased activity tolerance, decreased balance, decreased coordination, decreased endurance, decreased knowledge of use of DME, decreased mobility, difficulty walking, decreased strength, decreased safety awareness, increased edema, impaired tone, improper body mechanics, and postural dysfunction.   ACTIVITY LIMITATIONS: carrying, lifting, bending, standing, squatting, stairs, transfers, bathing, dressing, locomotion level, and caring for others  PARTICIPATION LIMITATIONS: meal prep, cleaning, laundry, medication management, personal finances, driving, shopping, community activity, occupation, and yard work  PERSONAL FACTORS: Sex, Time since onset of injury/illness/exacerbation, and 1-2 comorbidities: per chart PMH includes CAD, HTN, ischemic cardiomyopathy, STEMI, ventricular fibrillation, dysphagia, s/p ICD, apraxia  are also affecting patient's functional outcome.   REHAB POTENTIAL: Fair    CLINICAL DECISION MAKING: Evolving/moderate complexity  EVALUATION COMPLEXITY: High  PLAN:  PT FREQUENCY: 1-2x/week  PT DURATION: 12 weeks  PLANNED INTERVENTIONS: 97164- PT Re-evaluation, 97750- Physical Performance Testing, 97110-Therapeutic exercises, 97530- Therapeutic activity, 97112- Neuromuscular re-education, 97535- Self Care, [id number]- Manual therapy, [phone]- Gait training, [phone]- Orthotic Initial, [phone]- Orthotic/Prosthetic subsequent, [phone]- Canalith repositioning, Patient/Family education, Balance training, Stair training, Taping, Joint mobilization, Spinal mobilization, Vestibular training, DME instructions, Wheelchair mobility training, Cryotherapy, and Moist heat   PLAN FOR NEXT SESSION:   PROGRESS REPORT!  backwards gait  training using RW with skilled assistance - perform in environment with limited distractions to allow improved motor learning Transfer training- learning sequence of AD management and stepping back to seat Gait training using RW in home-like environment Dynamic balance Reaching with use of Blaze Pods for tapping hands   Fonda Simpers, PT, DPT Physical Therapist - [facility]  01/16/24, 3:41 PM

## 2024-01-17 NOTE — Telephone Encounter (Signed)
 I have not called the Patient this week and do not see any documentation where someone else called.

## 2024-01-17 NOTE — Telephone Encounter (Unsigned)
 Copied from CRM #[id number]. Topic: General - Call Back - No Documentation >> Jan 17, 2024 10:28 AM Carlyon D wrote: Reason for CRM: Pt mother Myrick is calling in regards to getting a call and VM from office there is no documentation that I can see. Please call pt back if reached out

## 2024-01-21 ENCOUNTER — Ambulatory Visit

## 2024-01-22 NOTE — Therapy (Signed)
 OUTPATIENT PHYSICAL THERAPY TREATMENT/ Physical Therapy Progress Note   Dates of reporting period  12/05/23   to   01/23/2024     Patient Name: Beverly [name], 38 y.o., female MRN: [id number] DOB:1985-08-29, 38 y.o., female Today's Date: 01/23/2024  PCP: Center, [facility] REFERRING PROVIDER: Babs Arthea DASEN, MD  END OF SESSION:   PT End of Session - 01/23/24 1344     Visit Number 50    Number of Visits 59    Date for Recertification  02/06/24    Authorization Type UHC Dual Complete; Medicaid secondary    Progress Note Due on Visit 40    PT Start Time 1400    PT Stop Time 1444    PT Time Calculation (min) 44 min    Equipment Utilized During Treatment Gait belt    Activity Tolerance Patient tolerated treatment well    Behavior During Therapy Flat affect;Impulsive;WFL for tasks assessed/performed            Past Medical History:  Diagnosis Date   Brain injury [facility])    CAD (coronary artery disease)    HLD (hyperlipidemia)    Hypertension    last pregnancy   Ischemic cardiomyopathy    STEMI (ST elevation myocardial infarction) (HCC) 09/2019   SCAD with aneurysmal dilation of proximal LAD.   Vaginal Pap smear, abnormal    when she was 38yo   Ventricular fibrillation (HCC) 09/23/2019   Past Surgical History:  Procedure Laterality Date   CARDIAC CATHETERIZATION     IR GASTROSTOMY TUBE MOD SED  10/07/2019   IR GASTROSTOMY TUBE REMOVAL  07/02/2020   IR REPLACE G-TUBE SIMPLE WO FLUORO  09/28/2023   IR REPLC GASTRO/COLONIC TUBE PERCUT W/FLUORO  11/03/2021   IR REPLC GASTRO/COLONIC TUBE PERCUT W/FLUORO  03/09/2023   LEFT HEART CATH AND CORONARY ANGIOGRAPHY N/A 09/23/2019   Procedure: LEFT HEART CATH AND CORONARY ANGIOGRAPHY;  Surgeon: Mady [name], MD;  Location: [facility] INVASIVE CV LAB;  Service: Cardiovascular;  Laterality: N/A;   SUBQ ICD IMPLANT N/A 11/26/2020   Procedure: SUBQ ICD IMPLANT;  Surgeon: [name], MD;  Location: [facility] INVASIVE CV LAB;   Service: Cardiovascular;  Laterality: N/A;   Patient Active Problem List   Diagnosis Date Noted   Nocturnal enuresis 01/01/2024   Hyperlipidemia 12/01/2023   Apraxia 10/05/2021   S/P ICD (internal cardiac defibrillator) procedure 11/27/2020   Hx of Cardiac arrest (HCC) 11/26/2020   Abnormality of gait 03/16/2020   Sleep disturbance 03/16/2020   Oropharyngeal dysphagia 02/12/2020   Coronary artery disease involving native coronary artery of native heart without angina pectoris 12/12/2019   Ischemic cardiomyopathy 12/12/2019   Brain injury (HCC)    Prediabetes    Anoxic brain injury (HCC) 11/05/2019   Dysphagia 11/05/2019   Physical deconditioning 11/05/2019   Acute delirium 11/05/2019   Respiratory failure (HCC)    Encounter for central line placement    Ventricular fibrillation (HCC) 09/23/2019   Acute combined systolic and diastolic heart failure (HCC) 09/23/2019   Ventricular tachycardia, polymorphic (HCC) 09/23/2019   Pelvic pain affecting pregnancy 10/15/2015   Supervision of normal pregnancy in third trimester 09/28/2015   Poor weight gain of pregnancy 09/28/2015   Iron  deficiency anemia of pregnancy 09/01/2015   Increased BMI (body mass index) 07/29/2015    ONSET DATE: August 2021  REFERRING DIAG:  G93.1 (ICD-10-CM) - Anoxic brain injury (HCC)  R48.2 (ICD-10-CM) - Apraxia    THERAPY DIAG:   Muscle weakness (generalized)  Difficulty in walking, not elsewhere  classified  Abnormality of gait and mobility  Unsteadiness on feet  Rationale for Evaluation and Treatment: Rehabilitation  SUBJECTIVE:                                                                                                                                                                                             SUBJECTIVE STATEMENT:   Patient presents with father. Reports no changes.   Pt accompanied by: self, father   PERTINENT HISTORY:   Patient is a 38 year old with diagnois of  anoxic brain injury. Pt known to PT clinic. Previously seen for outpatient services March 2024. Pt was receiving HH PT until October 2024 per her mother's report. [name] reports pt using HHA for ambulation, was using RW but she says was told not to do to difficulty with safe technique. Pt has fallen 1x in the past six months trying to ambulate on her own. Pt requires assistance with ADLs, including bathing and dressing. [name] reports pt never indicates any pain. Pt with severe aphasia so is unable to answer questions/pt's mom provides hx. She reports pt has impaired short-term memory, difficulty with stair climbing (has stair lift at home). She reports pt's BLE are weaker than they used to be. The pt is probably limited to <10 min with ambulation due to poor endurance. Pt reports her goal is to be able to use an AD to achieve mod-I mobility rather than relying on someone's HHA to ambulate.She was receiving outpatient PT until Feb. 2022 last year but ran out of visits. Patient has past medical history of Anoxic Brain Injury 09/2019, CAD, HLD, HTN, ISCHEMIC CARDIMYOPATHY, STEMI, V-FIB, ICD on 11/29/2020. Patient is currently living with mom. Mother reports she is abel to walk some with a walker with supervision but primarily ambulates with HHA of mother who is her guardian. Mother also reports she requires assist for all transfers and ADL's.   PAIN:  Are you having pain? No  PRECAUTIONS: Fall  WEIGHT BEARING RESTRICTIONS: No  FALLS: Has patient fallen in last 6 months? Yes. Number of falls 1  LIVING ENVIRONMENT: Lives with: lives with their family, mother Lives in: House/apartment Stairs: has stairs but stair lift installed, pt using lift Has following equipment at home: Environmental Consultant - 2 wheeled, Wheelchair (manual), shower chair, and Grab bars, pt uses stair lift  PLOF: Needs assistance with ADLs  PATIENT GOALS: balance, walking, and pt states basketball  OBJECTIVE:  Note: Objective  measures were completed at Evaluation unless otherwise noted.  DIAGNOSTIC FINDINGS:  None recent in chart  COGNITION: Overall cognitive status: impaired   SENSATION: Unable to  complete testing due to impaired cognition, pt unable to report if sensation impairment present, pt mother unsure   EDEMA:  Mom reports pt's feet do swell from time to time   LOWER EXTREMITY MMT:  *accuracy of testing likely impacted by pt difficulty following cues  MMT Right Eval Left Eval  Hip flexion 4+ 4+  Hip extension    Hip abduction 4+ 4+  Hip adduction 4+ 4+  Hip internal rotation    Hip external rotation    Knee flexion 4+ 4+  Knee extension 5 5  Ankle dorsiflexion    Ankle plantarflexion    Ankle inversion    Ankle eversion    (Blank rows = not tested)  TRANSFERS: HHA  tried in session, very unsteady, pt has difficulty with motor planning, little success with cues due to distraction, very unsafe Attempted in session with RW, pt unsteady, gait ataxic, continued significant difficulty with motor planning requiring up to min a + 2 and multi-modal cues to safely sit in chair.   FUNCTIONAL TESTS:  5 times sit to stand: 1 min 43 sec Timed up and go (TUG): 1 min 30 sec with RW 10 meter walk test: 0.25 m/s with RW extensive cuing to complete activity  PATIENT SURVEYS:  SIS-16 filled out by pt's mother, used as questions relevant to pt: 45                                                                                                                              TREATMENT DATE: 01/23/2024   *Unless otherwise stated, CGA/min A was provided and gait belt donned in order to ensure pt safety throughout session.*  Physical therapy treatment session today consisted of completing assessment of goals and administration of testing as demonstrated and documented in flow sheet, treatment, and goals section of this note. Addition treatments may be found below.   10 Meter Walk Test: Patient instructed  to walk 10 meters (32.8 ft) as quickly and as safely as possible at their normal speed x2 and at a fast speed x2. Time measured from 2 meter mark to 8 meter mark to accommodate ramp-up and ramp-down.  Normal speed 1: 38 seconds   Average Fast speed: 38  Cut off scores: <0.4 m/s = household Ambulator, 0.4-0.8 m/s = limited community Ambulator, >0.8 m/s = community Ambulator, >1.2 m/s = crossing a street, <1.0 = increased fall risk MCID 0.05 m/s (small), 0.13 m/s (moderate), 0.06 m/s (significant)  (ANPTA Core Set of Outcome Measures for Adults with Neurologic Conditions, 2018)  Pt performed 5 time sit<>stand (5xSTS): 41  sec with UE support  (>15 sec indicates increased fall risk)   PT instructed pt in TUG: first try 1 min 8 seconds due to difficulty turning; second attempt; second attempt 39  sec (average of 3 trials; >13.5 sec indicates increased fall risk)  400 ft: ambulate 310 ft with mod A for walker control, max cueing for foot placement  and walker placement  SIS-16: unable to give today due to mom not present  PATIENT EDUCATION: Education details: goal reassessment, indications, exercise technique Person educated: Patient and Parent Education method: Explanation, Demonstration, Tactile cues, and Verbal cues Education comprehension: returned demonstration, verbal cues required, tactile cues required, and needs further education   HOME EXERCISE PROGRAM: Access Code: [email or web address] URL: [email or web address] Date: 06/11/2023 Prepared by: Massie Dollar  Exercises - Standing March with Counter Support  - 1 x daily - 7 x weekly - 2 sets - 8 reps - Side Stepping with Counter Support  - 1 x daily - 7 x weekly - 2 sets - 4 reps - Sit to Stand with Armchair  - 1 x daily - 7 x weekly - 2 sets - 5 reps - Seated March  - 1 x daily - 7 x weekly - 3 sets - 10 reps  GOALS: Goals reviewed with patient? Yes   SHORT TERM GOALS: Target date: 07/11/2023  Patient will be independent in  home exercise program to improve strength/mobility for better functional independence with ADLs. Baseline: Goal status: INITIAL   LONG TERM GOALS: Target date: 02/06/2024  1.  Patient will complete five times sit to stand test in < 35 seconds indicating an increased LE strength and improved balance. Baseline: previously 37 sec with UUE 04/19/22 today 05/30/23: 1 min 43 seconds; 6/3: 41 sec with improved technique (use of BUE on armrests);  08/21/2023= 38 sec with some distractibility and VC for hand placement 7:23: 39.06 (41.23, 36.89 sec) 10/29/23: 56.85 with HHA; 1 min 16 sec with B UE on armrests 11/14/2023= 32.68 sec with BUE Support (VC for consistency of hand position)  12/05/23: 1 min 40 sec with R UE support (VC for proper technique) 12/17: 41 seconds with UE support on chair Goal status: IN PROGRESS  2. Patient will demo ability to ambulate > 400 ft w/ LRAD versus hand held assist min assist to demonstrate improved household mobility and short community distances.  Baseline: 4/28: 184ft with RW. Mod assist for AD management to prevent veer to the R. 6/3: ~ 300 ft with RW, requires cuing throughout for proximity to RW particularly with turns, but this has improved compared to eval;  08/21/2023= Patient ambulated approx 300 feet with RW- Sill some min assist at times to navigate walker and VC to remain close to walker.  7/23: 343ft with RW and min assist for safety and moderate instruction for safety AD intermittently, especially with fatigue  10/29/23:  Pt able to ambulate 500' with RW, however required modA at times to keep the walker stable 11/14/2023= Will assess next visit 12/05/23: Pt able to ambulate ~300' with RW and modA from therapist to keep the walker close/stable 12/17: 310 ft with RW with mod A for walker positioning  Goal status: IN PROGRESS  3.  Patient will increase 10 meter walk test to >1.32m/s as to improve gait speed for better community ambulation and to reduce fall  risk. Baseline: previously 0.36 m/s on 04/19/22 : 0.25 m/s;  07/10/23: 0. 24 m/s with RW;  08/15/2023= 0.39 m/s  with RW yet max VC for RW navigation.  7/23:Average Normal speed with RW: 0.25 m/s Average Normal speed with HHA: 0.37m/s 10/29/23: Average Normal speed with RW: 0.23 m/s 11/14/2023= 0.28 m/s with RW (VC for navigation); 0.45 m/s with HHA 12/05/23: 0.18 m/s; 53.95 sec with RW  12/17: 38 seconds with RW  Goal status: PROGRESSING  4.  Patient will reduce timed up and  go (TUG) to <30 seconds to reduce fall risk and demonstrate improved transfer/gait ability Baseline: previously 91 sec with RW on 04/19/22; 05/30/23: 1 min 30 sec with RW 07/19/2023: 48.265 seconds using RW with skilled min A primarily for AD management with pt having difficulty turning with AD;  08/21/2023- 33 sec with HHA and 1 min 25 sec with RW (Max VC for safety with turning back to seat and to reach back safely)  7/23:42.18sec with RW and min assist for safety in turn; 40.35 sec with HHA and min assist for safety with turns.  10/29/23: TBD 11/14/2023= 43 sec with RW with difficulty turning 180 at end and max VC to reach back; 31 sec with HHA today. VC for turning.  12/17: 39 seconds with RW assistance for guiding walker for turn Goal status: MET/ progressed  5. Pt will exhibit correct and safe technique with stand>sit into a standard chair or transport chair to reduce risk of falling.   Baseline: pt current technique unsafe, poor motor planning; 6/3: pt with improved technique, bring RW closer to chair prior to sitting; 08/21/2023- Patient able to perform with CGA yet inconsistent with cues for safety and consistency with hand placement.  10/29/23: Pt still with inconsistent STS's, utilizing heavy UE assistance  11/14/2023= Patient continues to be inconsistent but performed around 10 times today and able to reach back and push up using UE appropriately 7/10 times.  Goal status: PARTIALLY MET   6. The pt will demonstrate at  least a 15 point improvement on SIS-16 to indicate increased ease with ADLs. Baseline: 45 (filled out by pt's mother); 42; 08/21/2023= 46; 09/03/2023: 46/80 10/29/23: TBD 11/19/2023- Mother not present so will assess next visit she is present. 12/17: unable to give today due to mom not present Goal status: ONGOING   ASSESSMENT:  CLINICAL IMPRESSION:    Patient's condition has the potential to improve in response to therapy. Maximum improvement is yet to be obtained. The anticipated improvement is attainable and reasonable in a generally predictable time. Patient demonstrates excellent progression of mobility and ambulation with STS, TUG, and 10 MWT goal. She is very challenged with negotiation of room with walker requiring assistance for positioning of walker and cues for foot placement. Patient continues to demonstrate excellent advancement and  motivation for independent mobility.  Pt will continue to benefit from skilled therapy to address remaining deficits in order to improve overall QoL and return to PLOF.       OBJECTIVE IMPAIRMENTS: Abnormal gait, decreased activity tolerance, decreased balance, decreased coordination, decreased endurance, decreased knowledge of use of DME, decreased mobility, difficulty walking, decreased strength, decreased safety awareness, increased edema, impaired tone, improper body mechanics, and postural dysfunction.   ACTIVITY LIMITATIONS: carrying, lifting, bending, standing, squatting, stairs, transfers, bathing, dressing, locomotion level, and caring for others  PARTICIPATION LIMITATIONS: meal prep, cleaning, laundry, medication management, personal finances, driving, shopping, community activity, occupation, and yard work  PERSONAL FACTORS: Sex, Time since onset of injury/illness/exacerbation, and 1-2 comorbidities: per chart PMH includes CAD, HTN, ischemic cardiomyopathy, STEMI, ventricular fibrillation, dysphagia, s/p ICD, apraxia  are also affecting  patient's functional outcome.   REHAB POTENTIAL: Fair    CLINICAL DECISION MAKING: Evolving/moderate complexity  EVALUATION COMPLEXITY: High  PLAN:  PT FREQUENCY: 1-2x/week  PT DURATION: 12 weeks  PLANNED INTERVENTIONS: [id number]- PT Re-evaluation, 97750- Physical Performance Testing, 97110-Therapeutic exercises, 97530- Therapeutic activity, [id number]- Neuromuscular re-education, 97535- Self Care, [id number]- Manual therapy, [id number]- Gait training, [id number]- Orthotic Initial, [id number]- Orthotic/Prosthetic subsequent, [phone]- Canalith repositioning,  Patient/Family education, Balance training, Stair training, Taping, Joint mobilization, Spinal mobilization, Vestibular training, DME instructions, Wheelchair mobility training, Cryotherapy, and Moist heat   PLAN FOR NEXT SESSION:   PROGRESS REPORT!  backwards gait training using RW with skilled assistance - perform in environment with limited distractions to allow improved motor learning Transfer training- learning sequence of AD management and stepping back to seat Gait training using RW in home-like environment Dynamic balance Reaching with use of Blaze Pods for tapping hands  Vernesha Talbot  Leopoldo, PT, DPT Physical Therapist - [facility]  Outpatient Physical Therapy- Main Campus [phone]    01/23/2024, 2:45 PM

## 2024-01-23 ENCOUNTER — Ambulatory Visit: Admitting: Occupational Therapy

## 2024-01-23 ENCOUNTER — Encounter: Attending: Physical Medicine & Rehabilitation | Admitting: Physical Medicine & Rehabilitation

## 2024-01-23 ENCOUNTER — Encounter: Payer: Self-pay | Admitting: Physical Medicine & Rehabilitation

## 2024-01-23 ENCOUNTER — Ambulatory Visit

## 2024-01-23 VITALS — BP 92/64 | HR 76 | Ht 69.0 in | Wt 200.0 lb

## 2024-01-23 DIAGNOSIS — R1312 Dysphagia, oropharyngeal phase: Secondary | ICD-10-CM | POA: Diagnosis not present

## 2024-01-23 DIAGNOSIS — M6281 Muscle weakness (generalized): Secondary | ICD-10-CM

## 2024-01-23 DIAGNOSIS — R2681 Unsteadiness on feet: Secondary | ICD-10-CM

## 2024-01-23 DIAGNOSIS — G931 Anoxic brain damage, not elsewhere classified: Secondary | ICD-10-CM | POA: Diagnosis not present

## 2024-01-23 DIAGNOSIS — R269 Unspecified abnormalities of gait and mobility: Secondary | ICD-10-CM

## 2024-01-23 DIAGNOSIS — R262 Difficulty in walking, not elsewhere classified: Secondary | ICD-10-CM

## 2024-01-23 DIAGNOSIS — R482 Apraxia: Secondary | ICD-10-CM | POA: Diagnosis not present

## 2024-01-23 NOTE — Progress Notes (Signed)
 Subjective:    Patient ID: Beverly [name], female    DOB: [birth year], 38 y.o.   MRN: [id number]  HPI Discussed the use of AI scribe software for clinical note transcription with the patient, who gave verbal consent to proceed.  History of Present Illness Beverly [name] is a 38 year old female with anoxic brain injury who presents for follow-up of persistent neurological and functional deficits.  Neurological and cognitive deficits - Persistent sequelae of anoxic brain injury, including apraxia and difficulty initiating actions - Increased verbalization and question-asking - Occasionally follows commands with prompting - Difficulty maintaining attention  Oropharyngeal dysphagia - Dysphagia remains variable without a clear pattern - Intermittent requirement for feeding tube  Gait abnormality and functional mobility - Persistent gait abnormality - Remains engaged in physical therapy at both [facility] and [facility] - Father provides transportation to therapy sessions  Speech and language impairment - Remains engaged in speech therapy at both [facility] and [facility] - Communication from therapists has been inconsistent, particularly with the new therapist at [facility] - Previous therapist, Beverly [name], expected to resume care next month  Medication management - Current medication: Sinemet  four times daily - Ritalin  and Adderall previously discontinued due to limited efficacy - Bromocriptine has not been trialed  Vision - Received new glasses since last visit - No visual complaints     Pain Inventory Average Pain 0 Pain Right Now 0 My pain is no pain  LOCATION OF PAIN  no pain  BOWEL Number of stools per week: 7 Oral laxative use No  Type of laxative Miralzx as needed Enema or suppository use No  History of colostomy No  Incontinent Yes   BLADDER Pads In and out cath, frequency No Able to self cath No  Bladder incontinence Yes  Frequent urination No  Leakage with coughing No  Difficulty  starting stream No  Incomplete bladder emptying No    Mobility use a wheelchair  Function disabled: date disabled    Neuro/Psych bladder control problems bowel control problems weakness spasms  Prior Studies Any changes since last visit?  no  Physicians involved in your care Primary care  Beverly Aurora, NP   Family History  Problem Relation Age of Onset   Hyperlipidemia Mother    Hypertension Mother    Diabetes Maternal Grandmother    Seizures Maternal Grandmother    Thyroid  disease Maternal Grandmother    Diabetes Paternal Grandmother    Rashes / Skin problems Son        ezcema   Seizures Son    Social History   Socioeconomic History   Marital status: Single    Spouse name: Not on file   Number of children: 3   Years of education: Not on file   Highest education level: Some college, no degree  Occupational History   Occupation: oncology    Employer: [employer]    Comment: NA  Tobacco Use   Smoking status: Never   Smokeless tobacco: Never  Vaping Use   Vaping status: Never Used  Substance and Sexual Activity   Alcohol use: Not Currently    Alcohol/week: 0.0 standard drinks of alcohol    Comment: quit 2017   Drug use: No    Frequency: 5.0 times per week    Types: Marijuana    Comment: stopped when she found out she was pregnant   Sexual activity: Not Currently    Partners: Male    Birth control/protection: None  Other Topics Concern   Not  on file  Social History Narrative   01/06/20 lives with mom and her children   Social Drivers of Health   Tobacco Use: Low Risk (01/23/2024)   Patient History    Smoking Tobacco Use: Never    Smokeless Tobacco Use: Never    Passive Exposure: Not on file  Financial Resource Strain: Low Risk (11/12/2023)   Overall Financial Resource Strain (CARDIA)    Difficulty of Paying Living Expenses: Not hard at all  Food Insecurity: No Food Insecurity (11/12/2023)   Epic    Worried About Programme Researcher, Broadcasting/film/video in the  Last Year: Never true    Ran Out of Food in the Last Year: Never true  Transportation Needs: No Transportation Needs (11/12/2023)   Epic    Lack of Transportation (Medical): No    Lack of Transportation (Non-Medical): No  Physical Activity: Insufficiently Active (11/12/2023)   Exercise Vital Sign    Days of Exercise per Week: 2 days    Minutes of Exercise per Session: 30 min  Stress: No Stress Concern Present (11/12/2023)   [employer] of Occupational Health - Occupational Stress Questionnaire    Feeling of Stress: Not at all  Social Connections: Socially Isolated (11/12/2023)   Social Connection and Isolation Panel    Frequency of Communication with Friends and Family: Never    Frequency of Social Gatherings with Friends and Family: Once a week    Attends Religious Services: Patient declined    Active Member of Clubs or Organizations: No    Attends Engineer, Structural: Not on file    Marital Status: Never married  Depression (PHQ2-9): Low Risk (11/15/2023)   Depression (PHQ2-9)    PHQ-2 Score: 0  Alcohol Screen: Not on file  Housing: Low Risk (11/12/2023)   Epic    Unable to Pay for Housing in the Last Year: No    Number of Times Moved in the Last Year: 0    Homeless in the Last Year: No  Utilities: Not on file  Health Literacy: Not on file   Past Surgical History:  Procedure Laterality Date   CARDIAC CATHETERIZATION     IR GASTROSTOMY TUBE MOD SED  10/07/2019   IR GASTROSTOMY TUBE REMOVAL  07/02/2020   IR REPLACE G-TUBE SIMPLE WO FLUORO  09/28/2023   IR REPLC GASTRO/COLONIC TUBE PERCUT W/FLUORO  11/03/2021   IR REPLC GASTRO/COLONIC TUBE PERCUT W/FLUORO  03/09/2023   LEFT HEART CATH AND CORONARY ANGIOGRAPHY N/A 09/23/2019   Procedure: LEFT HEART CATH AND CORONARY ANGIOGRAPHY;  Surgeon: Mady [name], MD;  Location: [facility] INVASIVE CV LAB;  Service: Cardiovascular;  Laterality: N/A;   SUBQ ICD IMPLANT N/A 11/26/2020   Procedure: SUBQ ICD IMPLANT;  Surgeon: [name], MD;  Location: [facility] INVASIVE CV LAB;  Service: Cardiovascular;  Laterality: N/A;   Past Medical History:  Diagnosis Date   Brain injury (HCC)    CAD (coronary artery disease)    HLD (hyperlipidemia)    Hypertension    last pregnancy   Ischemic cardiomyopathy    STEMI (ST elevation myocardial infarction) (HCC) 09/2019   SCAD with aneurysmal dilation of proximal LAD.   Vaginal Pap smear, abnormal    when she was 38yo   Ventricular fibrillation (HCC) 09/23/2019   BP 92/64 (BP Location: Left Arm, Patient Position: Sitting, Cuff Size: Normal)   Pulse 76   Ht 5' 9 (1.753 m)   Wt 200 lb (90.7 kg) Comment: last documented  SpO2 92%   BMI 29.53  kg/m   Opioid Risk Score:   Fall Risk Score:  `1  Depression screen PHQ 2/9     11/15/2023    1:08 PM 09/26/2023   12:58 PM 05/23/2023   10:46 AM 11/22/2022   11:00 AM 03/22/2022    2:07 PM 10/05/2021    3:35 PM 08/03/2021    3:15 PM  Depression screen PHQ 2/9  Decreased Interest 0 0 0 0 1 0 0  Down, Depressed, Hopeless 0 0 0 0 1 0 0  PHQ - 2 Score 0 0 0 0 2 0 0  Altered sleeping 0        Tired, decreased energy 0        Change in appetite 0        Feeling bad or failure about yourself  0        Trouble concentrating 0        Moving slowly or fidgety/restless 0        Suicidal thoughts 0        PHQ-9 Score 0         Difficult doing work/chores Not difficult at all           Data saved with a previous flowsheet row definition      Review of Systems  Cardiovascular:        CAD, HLD, Ischemic Cardiomyopathy, HYN  Neurological:  Positive for tremors, speech difficulty, weakness and numbness.  All other systems reviewed and are negative.      Objective:   Physical Exam General: No acute distress HEENT: NCAT, EOMI, oral membranes moist Cards: reg rate  Chest: normal effort Abdomen: Soft, NT, ND Skin: dry, intact Extremities: no edema Psych: pleasant and appropriate  Skin: Warm and dry.  Intact.    Musc: No edema  in extremities.  No tenderness in extremities. Neuro: engages and follows commands more so than past visit.  Can be cued to respond. Still pockets puffs cheeks but no saliva.. Remains  apraxic but initiates more today   Assessment & Plan:  1. Anoxic BI - substantial attention, initiation deficits as well as memory deficits              MRI/CTs unremarkable             -Recent ICD placement.           --wean sinemet    25/100 to off over 3 weeks -consider trial of bromocriptine if no adverse effects coming off sinemet   . 2. Dysphagia:             -ongoing oral motor apraxia and coordination.  -Cont PEG as a back up for now             - -NO E-STIM,  -[facility] speech therapy               3. Gait abnormality             outpt PT at [facility]              RW for balance, as possible              3. Sleep disturbance: improved             Melatonin 3 mg daily --can take with trazodone             -trazodone  at night time scheduled. increase to 100mg               -More alert

## 2024-01-23 NOTE — Patient Instructions (Signed)
°  VISIT SUMMARY: Today, we discussed your ongoing recovery from an anoxic brain injury, focusing on your neurological and functional deficits. We reviewed your current medications, therapy progress, and addressed specific issues such as dysphagia, gait abnormalities, and speech impairments.  YOUR PLAN: -ANOXIC BRAIN INJURY: An anoxic brain injury occurs when the brain is deprived of oxygen, leading to various neurological deficits. We are gradually reducing your Sinemet  dosage over the next three weeks and will discontinue it if no changes are observed. If necessary, we may consider starting bromocriptine, but we will discuss the potential cardiac risks before proceeding. Please continue with your physical and speech therapies.  -OROPHARYNGEAL DYSPHAGIA: Oropharyngeal dysphagia is difficulty swallowing due to problems in the throat. You will continue with your current feeding regimen and therapy as there has been no significant improvement in swallowing.  -APRAXIA: Apraxia is a condition where you have difficulty initiating and performing movements or tasks. You will continue with therapies that target apraxia and attention deficits to help improve your ability to follow commands and initiate actions.  INSTRUCTIONS: Please follow the plan to gradually reduce your Sinemet  dosage as discussed. Continue attending your physical and speech therapy sessions. If you notice no difference after discontinuing Sinemet , contact us  to discuss the possibility of starting bromocriptine. Your next follow-up appointment will be in three months.

## 2024-01-24 NOTE — Therapy (Signed)
 OUTPATIENT PHYSICAL THERAPY TREATMENT     Patient Name: Beverly [name] MRN: [id number] DOB:1985-08-29, 38 y.o., female Today's Date: 01/24/2024  PCP: [facility] REFERRING PROVIDER: Babs Arthea DASEN, MD  END OF SESSION:       Past Medical History:  Diagnosis Date   Brain injury [facility])    CAD (coronary artery disease)    HLD (hyperlipidemia)    Hypertension    last pregnancy   Ischemic cardiomyopathy    STEMI (ST elevation myocardial infarction) (HCC) 09/2019   SCAD with aneurysmal dilation of proximal LAD.   Vaginal Pap smear, abnormal    when she was 38yo   Ventricular fibrillation (HCC) 09/23/2019   Past Surgical History:  Procedure Laterality Date   CARDIAC CATHETERIZATION     IR GASTROSTOMY TUBE MOD SED  10/07/2019   IR GASTROSTOMY TUBE REMOVAL  07/02/2020   IR REPLACE G-TUBE SIMPLE WO FLUORO  09/28/2023   IR REPLC GASTRO/COLONIC TUBE PERCUT W/FLUORO  11/03/2021   IR REPLC GASTRO/COLONIC TUBE PERCUT W/FLUORO  03/09/2023   LEFT HEART CATH AND CORONARY ANGIOGRAPHY N/A 09/23/2019   Procedure: LEFT HEART CATH AND CORONARY ANGIOGRAPHY;  Surgeon: Mady [name], MD;  Location: [facility] INVASIVE CV LAB;  Service: Cardiovascular;  Laterality: N/A;   SUBQ ICD IMPLANT N/A 11/26/2020   Procedure: SUBQ ICD IMPLANT;  Surgeon: [name], MD;  Location: [facility] INVASIVE CV LAB;  Service: Cardiovascular;  Laterality: N/A;   Patient Active Problem List   Diagnosis Date Noted   Nocturnal enuresis 01/01/2024   Hyperlipidemia 12/01/2023   Apraxia 10/05/2021   S/P ICD (internal cardiac defibrillator) procedure 11/27/2020   Hx of Cardiac arrest (HCC) 11/26/2020   Abnormality of gait 03/16/2020   Sleep disturbance 03/16/2020   Oropharyngeal dysphagia 02/12/2020   Coronary artery disease involving native coronary artery of native heart without angina pectoris 12/12/2019   Ischemic cardiomyopathy 12/12/2019   Brain injury (HCC)    Prediabetes    Anoxic  brain injury (HCC) 11/05/2019   Dysphagia 11/05/2019   Physical deconditioning 11/05/2019   Acute delirium 11/05/2019   Respiratory failure (HCC)    Encounter for central line placement    Ventricular fibrillation (HCC) 09/23/2019   Acute combined systolic and diastolic heart failure (HCC) 09/23/2019   Ventricular tachycardia, polymorphic (HCC) 09/23/2019   Pelvic pain affecting pregnancy 10/15/2015   Supervision of normal pregnancy in third trimester 09/28/2015   Poor weight gain of pregnancy 09/28/2015   Iron  deficiency anemia of pregnancy 09/01/2015   Increased BMI (body mass index) 07/29/2015    ONSET DATE: August 2021  REFERRING DIAG:  G93.1 (ICD-10-CM) - Anoxic brain injury (HCC)  R48.2 (ICD-10-CM) - Apraxia    THERAPY DIAG:   No diagnosis found.  Rationale for Evaluation and Treatment: Rehabilitation  SUBJECTIVE:  SUBJECTIVE STATEMENT:  ***  Pt accompanied by: self, father   PERTINENT HISTORY:   Patient is a 38 year old with diagnois of anoxic brain injury. Pt known to PT clinic. Previously seen for outpatient services March 2024. Pt was receiving HH PT until October 2024 per her mother's report. [name] reports pt using HHA for ambulation, was using RW but she says was told not to do to difficulty with safe technique. Pt has fallen 1x in the past six months trying to ambulate on her own. Pt requires assistance with ADLs, including bathing and dressing. [name] reports pt never indicates any pain. Pt with severe aphasia so is unable to answer questions/pt's mom provides hx. She reports pt has impaired short-term memory, difficulty with stair climbing (has stair lift at home). She reports pt's BLE are weaker than they used to be. The pt is probably limited to <10 min with  ambulation due to poor endurance. Pt reports her goal is to be able to use an AD to achieve mod-I mobility rather than relying on someone's HHA to ambulate.She was receiving outpatient PT until Feb. 2022 last year but ran out of visits. Patient has past medical history of Anoxic Brain Injury 09/2019, CAD, HLD, HTN, ISCHEMIC CARDIMYOPATHY, STEMI, V-FIB, ICD on 11/29/2020. Patient is currently living with mom. Mother reports she is abel to walk some with a walker with supervision but primarily ambulates with HHA of mother who is her guardian. Mother also reports she requires assist for all transfers and ADL's.   PAIN:  Are you having pain? No  PRECAUTIONS: Fall  WEIGHT BEARING RESTRICTIONS: No  FALLS: Has patient fallen in last 6 months? Yes. Number of falls 1  LIVING ENVIRONMENT: Lives with: lives with their family, mother Lives in: House/apartment Stairs: has stairs but stair lift installed, pt using lift Has following equipment at home: Environmental Consultant - 2 wheeled, Wheelchair (manual), shower chair, and Grab bars, pt uses stair lift  PLOF: Needs assistance with ADLs  PATIENT GOALS: balance, walking, and pt states basketball  OBJECTIVE:  Note: Objective measures were completed at Evaluation unless otherwise noted.  DIAGNOSTIC FINDINGS:  None recent in chart  COGNITION: Overall cognitive status: impaired   SENSATION: Unable to complete testing due to impaired cognition, pt unable to report if sensation impairment present, pt mother unsure   EDEMA:  Mom reports pt's feet do swell from time to time   LOWER EXTREMITY MMT:  *accuracy of testing likely impacted by pt difficulty following cues  MMT Right Eval Left Eval  Hip flexion 4+ 4+  Hip extension    Hip abduction 4+ 4+  Hip adduction 4+ 4+  Hip internal rotation    Hip external rotation    Knee flexion 4+ 4+  Knee extension 5 5  Ankle dorsiflexion    Ankle plantarflexion    Ankle inversion    Ankle eversion    (Blank rows  = not tested)  TRANSFERS: HHA  tried in session, very unsteady, pt has difficulty with motor planning, little success with cues due to distraction, very unsafe Attempted in session with RW, pt unsteady, gait ataxic, continued significant difficulty with motor planning requiring up to min a + 2 and multi-modal cues to safely sit in chair.   FUNCTIONAL TESTS:  5 times sit to stand: 1 min 43 sec Timed up and go (TUG): 1 min 30 sec with RW 10 meter walk test: 0.25 m/s with RW extensive cuing to complete activity  PATIENT SURVEYS:  SIS-16 filled out by pt's mother, used as questions relevant to pt: 45                                                                                                                              TREATMENT DATE: 01/24/2024   *Unless otherwise stated, CGA/min A was provided and gait belt donned in order to ensure pt safety throughout session.*  *Pt arrived to therapy session in transport chair with mother accompanying her in waiting area.* Transfer from transport chair into green arm rest chair, mod assist from SPT for transition Overground ambulation, 1 lap, 1109ft using 2WW  Seated break Overground ambulation, 1 lap, 195ft using 2WW w/ BTB tied posterior to pt LE for tactile feedback for close body placement within walker during ambulation Seated break Overground ambulation, 1 lap, 198ft using 2WW w/ BTB and 5# AW donned to each side of walker to decrease distance between pt and walker Seated break Sit<>stand w/ hand held assist from SPT, 2x5, verbal and tactile cue for placement of feet, and upright posture Transport from green arm rest chair to transport chair using sit<>stand and pivot with 2WW with mod assist from PT and verbal cues for proper pivot technique and transition Pt wheeled out to mother in waiting area.   PATIENT EDUCATION: Education details: goal reassessment, indications, exercise technique Person educated: Patient and Parent Education  method: Explanation, Demonstration, Tactile cues, and Verbal cues Education comprehension: returned demonstration, verbal cues required, tactile cues required, and needs further education   HOME EXERCISE PROGRAM: Access Code: [email or web address] URL: [email or web address] Date: 06/11/2023 Prepared by: Massie Dollar  Exercises - Standing March with Counter Support  - 1 x daily - 7 x weekly - 2 sets - 8 reps - Side Stepping with Counter Support  - 1 x daily - 7 x weekly - 2 sets - 4 reps - Sit to Stand with Armchair  - 1 x daily - 7 x weekly - 2 sets - 5 reps - Seated March  - 1 x daily - 7 x weekly - 3 sets - 10 reps  GOALS: Goals reviewed with patient? Yes   SHORT TERM GOALS: Target date: 07/11/2023  Patient will be independent in home exercise program to improve strength/mobility for better functional independence with ADLs. Baseline: Goal status: INITIAL   LONG TERM GOALS: Target date: 02/06/2024  1.  Patient will complete five times sit to stand test in < 35 seconds indicating an increased LE strength and improved balance. Baseline: previously 37 sec with UUE 04/19/22 today 05/30/23: 1 min 43 seconds; 6/3: 41 sec with improved technique (use of BUE on armrests);  08/21/2023= 38 sec with some distractibility and VC for hand placement 7:23: 39.06 (41.23, 36.89 sec) 10/29/23: 56.85 with HHA; 1 min 16 sec with B UE on armrests 11/14/2023= 32.68 sec with BUE Support (VC for consistency of hand position)  12/05/23: 1 min 40 sec with R UE support (VC for proper  technique) 12/17: 41 seconds with UE support on chair Goal status: IN PROGRESS  2. Patient will demo ability to ambulate > 400 ft w/ LRAD versus hand held assist min assist to demonstrate improved household mobility and short community distances.  Baseline: 4/28: 173ft with RW. Mod assist for AD management to prevent veer to the R. 6/3: ~ 300 ft with RW, requires cuing throughout for proximity to RW particularly with turns, but  this has improved compared to eval;  08/21/2023= Patient ambulated approx 300 feet with RW- Sill some min assist at times to navigate walker and VC to remain close to walker.  7/23: 319ft with RW and min assist for safety and moderate instruction for safety AD intermittently, especially with fatigue  10/29/23:  Pt able to ambulate 500' with RW, however required modA at times to keep the walker stable 11/14/2023= Will assess next visit 12/05/23: Pt able to ambulate ~300' with RW and modA from therapist to keep the walker close/stable 12/17: 310 ft with RW with mod A for walker positioning  Goal status: IN PROGRESS  3.  Patient will increase 10 meter walk test to >1.71m/s as to improve gait speed for better community ambulation and to reduce fall risk. Baseline: previously 0.36 m/s on 04/19/22 : 0.25 m/s;  07/10/23: 0. 24 m/s with RW;  08/15/2023= 0.39 m/s  with RW yet max VC for RW navigation.  7/23:Average Normal speed with RW: 0.25 m/s Average Normal speed with HHA: 0.18m/s 10/29/23: Average Normal speed with RW: 0.23 m/s 11/14/2023= 0.28 m/s with RW (VC for navigation); 0.45 m/s with HHA 12/05/23: 0.18 m/s; 53.95 sec with RW  12/17: 38 seconds with RW  Goal status: PROGRESSING  4.  Patient will reduce timed up and go (TUG) to <30 seconds to reduce fall risk and demonstrate improved transfer/gait ability Baseline: previously 91 sec with RW on 04/19/22; 05/30/23: 1 min 30 sec with RW 07/19/2023: 48.265 seconds using RW with skilled min A primarily for AD management with pt having difficulty turning with AD;  08/21/2023- 33 sec with HHA and 1 min 25 sec with RW (Max VC for safety with turning back to seat and to reach back safely)  7/23:42.18sec with RW and min assist for safety in turn; 40.35 sec with HHA and min assist for safety with turns.  10/29/23: TBD 11/14/2023= 43 sec with RW with difficulty turning 180 at end and max VC to reach back; 31 sec with HHA today. VC for turning.  12/17: 39 seconds with  RW assistance for guiding walker for turn Goal status: MET/ progressed  5. Pt will exhibit correct and safe technique with stand>sit into a standard chair or transport chair to reduce risk of falling.   Baseline: pt current technique unsafe, poor motor planning; 6/3: pt with improved technique, bring RW closer to chair prior to sitting; 08/21/2023- Patient able to perform with CGA yet inconsistent with cues for safety and consistency with hand placement.  10/29/23: Pt still with inconsistent STS's, utilizing heavy UE assistance  11/14/2023= Patient continues to be inconsistent but performed around 10 times today and able to reach back and push up using UE appropriately 7/10 times.  Goal status: PARTIALLY MET   6. The pt will demonstrate at least a 15 point improvement on SIS-16 to indicate increased ease with ADLs. Baseline: 45 (filled out by pt's mother); 42; 08/21/2023= 46; 09/03/2023: 46/80 10/29/23: TBD 11/19/2023- Mother not present so will assess next visit she is present. 12/17: unable to give  today due to mom not present Goal status: ONGOING   ASSESSMENT:  CLINICAL IMPRESSION:   ***  Pt will continue to benefit from skilled therapy to address remaining deficits in order to improve overall QoL and return to PLOF.       OBJECTIVE IMPAIRMENTS: Abnormal gait, decreased activity tolerance, decreased balance, decreased coordination, decreased endurance, decreased knowledge of use of DME, decreased mobility, difficulty walking, decreased strength, decreased safety awareness, increased edema, impaired tone, improper body mechanics, and postural dysfunction.   ACTIVITY LIMITATIONS: carrying, lifting, bending, standing, squatting, stairs, transfers, bathing, dressing, locomotion level, and caring for others  PARTICIPATION LIMITATIONS: meal prep, cleaning, laundry, medication management, personal finances, driving, shopping, community activity, occupation, and yard work  PERSONAL FACTORS:  Sex, Time since onset of injury/illness/exacerbation, and 1-2 comorbidities: per chart PMH includes CAD, HTN, ischemic cardiomyopathy, STEMI, ventricular fibrillation, dysphagia, s/p ICD, apraxia  are also affecting patient's functional outcome.   REHAB POTENTIAL: Fair    CLINICAL DECISION MAKING: Evolving/moderate complexity  EVALUATION COMPLEXITY: High  PLAN:  PT FREQUENCY: 1-2x/week  PT DURATION: 12 weeks  PLANNED INTERVENTIONS: 97164- PT Re-evaluation, 97750- Physical Performance Testing, 97110-Therapeutic exercises, 97530- Therapeutic activity, 97112- Neuromuscular re-education, 97535- Self Care, [id number]- Manual therapy, [phone]- Gait training, [phone]- Orthotic Initial, [phone]- Orthotic/Prosthetic subsequent, [phone]- Canalith repositioning, Patient/Family education, Balance training, Stair training, Taping, Joint mobilization, Spinal mobilization, Vestibular training, DME instructions, Wheelchair mobility training, Cryotherapy, and Moist heat   PLAN FOR NEXT SESSION:   PROGRESS REPORT!  backwards gait training using RW with skilled assistance - perform in environment with limited distractions to allow improved motor learning Transfer training- learning sequence of AD management and stepping back to seat Gait training using RW in home-like environment Dynamic balance Reaching with use of Blaze Pods for tapping hands  Arwin Bisceglia  Leopoldo, PT, DPT Physical Therapist - [facility]  Outpatient Physical Therapy- Main Campus [phone]    01/24/2024, 9:38 AM

## 2024-01-28 ENCOUNTER — Ambulatory Visit

## 2024-01-28 DIAGNOSIS — M6281 Muscle weakness (generalized): Secondary | ICD-10-CM

## 2024-01-28 DIAGNOSIS — R269 Unspecified abnormalities of gait and mobility: Secondary | ICD-10-CM

## 2024-01-28 DIAGNOSIS — R2681 Unsteadiness on feet: Secondary | ICD-10-CM

## 2024-01-28 DIAGNOSIS — R262 Difficulty in walking, not elsewhere classified: Secondary | ICD-10-CM

## 2024-01-29 ENCOUNTER — Telehealth

## 2024-01-29 ENCOUNTER — Telehealth: Payer: Self-pay

## 2024-01-29 NOTE — Patient Outreach (Addendum)
 RNCM received call back from Angeline Pickett - CAP Team at [facility] regarding feeding and supplies. Angeline explained G-tube feed not provided through CAP funds, would be UHC/Medicaid and provided number for Nutrition Care Team Intake: [id number] option 1.   Angeline also provided information they need to process order - RNCM will contact PCP for documentation regarding:  Height/Weight/BMI Diagnosis demonstrating need and history sheet including date of G-tube placement Prescription clarification of Enlive Ensure 350 quantity needed daily/monthly Name of previous supplier and reason no longer providing (no longer taking Medicaid)  Called Mother and updated. LCSW updated. Unable to reach Nutrition Care Team. Will retry on Friday after holiday.

## 2024-01-29 NOTE — Patient Outreach (Addendum)
 RNCM - spoke with DPR/Legal Guardian/[name]. Explained RNCM role and services, declined need at this time and focused visit completed to meet need for G-tube feeding supplies.   Determined patient is enrolled in CAP Program and Mother is her Worker, and managing health needs. Mother reported need for G-Tube feeding and supplies - previous provider no longer taking insurance. Also in need of information about obtaining guardianship of daughter's children. Has appointment with LCSW 02/05/2024 at 11:00 am and aware LCSW would be able to provide resources. Interest in Adult Day Programs - aware to request info from CAP CM and can discuss with LCSW.   Assisted patient's Mom in determining status of G-tube feeding supplies:  Called and left message for CAP CM Angelica [phone] requesting call back to Mom and RNCM.   [facility] [phone] and spoke with Norleen who reported order for G-tube feeding and supplies created but not requested and transferred call to [employer] who works with CAP orders - left VM requesting call back to mother and RNCM regarding status of order and need for delivery. Mother reported having about 2 week supply remaining.   Mother declined need to schedule follow up with [facility] and is aware if would like appointment, to notify LCSW and she can schedule.   LCSW notified of above for appointment 02/05/2024.

## 2024-01-30 ENCOUNTER — Ambulatory Visit

## 2024-02-04 ENCOUNTER — Ambulatory Visit

## 2024-02-04 NOTE — Therapy (Incomplete)
 " OUTPATIENT PHYSICAL THERAPY TREATMENT     Patient Name: Beverly [name] MRN: [id number] DOB:[birth year], 38 y.o., female Today's Date: 02/04/2024  PCP: [facility] REFERRING PROVIDER: Babs Arthea DASEN, MD  END OF SESSION:        Past Medical History:  Diagnosis Date   Brain injury [facility])    CAD (coronary artery disease)    HLD (hyperlipidemia)    Hypertension    last pregnancy   Ischemic cardiomyopathy    STEMI (ST elevation myocardial infarction) (HCC) 09/2019   SCAD with aneurysmal dilation of proximal LAD.   Vaginal Pap smear, abnormal    when she was 38yo   Ventricular fibrillation (HCC) 09/23/2019   Past Surgical History:  Procedure Laterality Date   CARDIAC CATHETERIZATION     IR GASTROSTOMY TUBE MOD SED  10/07/2019   IR GASTROSTOMY TUBE REMOVAL  07/02/2020   IR REPLACE G-TUBE SIMPLE WO FLUORO  09/28/2023   IR REPLC GASTRO/COLONIC TUBE PERCUT W/FLUORO  11/03/2021   IR REPLC GASTRO/COLONIC TUBE PERCUT W/FLUORO  03/09/2023   LEFT HEART CATH AND CORONARY ANGIOGRAPHY N/A 09/23/2019   Procedure: LEFT HEART CATH AND CORONARY ANGIOGRAPHY;  Surgeon: Mady [name], MD;  Location: [facility] INVASIVE CV LAB;  Service: Cardiovascular;  Laterality: N/A;   SUBQ ICD IMPLANT N/A 11/26/2020   Procedure: SUBQ ICD IMPLANT;  Surgeon: [name], MD;  Location: [facility] INVASIVE CV LAB;  Service: Cardiovascular;  Laterality: N/A;   Patient Active Problem List   Diagnosis Date Noted   Nocturnal enuresis 01/01/2024   Hyperlipidemia 12/01/2023   Apraxia 10/05/2021   S/P ICD (internal cardiac defibrillator) procedure 11/27/2020   Hx of Cardiac arrest (HCC) 11/26/2020   Abnormality of gait 03/16/2020   Sleep disturbance 03/16/2020   Oropharyngeal dysphagia 02/12/2020   Coronary artery disease involving native coronary artery of native heart without angina pectoris 12/12/2019   Ischemic cardiomyopathy 12/12/2019   Brain injury (HCC)    Prediabetes    Anoxic  brain injury (HCC) 11/05/2019   Dysphagia 11/05/2019   Physical deconditioning 11/05/2019   Acute delirium 11/05/2019   Respiratory failure (HCC)    Encounter for central line placement    Ventricular fibrillation (HCC) 09/23/2019   Acute combined systolic and diastolic heart failure (HCC) 09/23/2019   Ventricular tachycardia, polymorphic (HCC) 09/23/2019   Pelvic pain affecting pregnancy 10/15/2015   Supervision of normal pregnancy in third trimester 09/28/2015   Poor weight gain of pregnancy 09/28/2015   Iron  deficiency anemia of pregnancy 09/01/2015   Increased BMI (body mass index) 07/29/2015    ONSET DATE: August 2021  REFERRING DIAG:  G93.1 (ICD-10-CM) - Anoxic brain injury (HCC)  R48.2 (ICD-10-CM) - Apraxia    THERAPY DIAG:   No diagnosis found.  Rationale for Evaluation and Treatment: Rehabilitation  SUBJECTIVE:  SUBJECTIVE STATEMENT:  ***  Pt accompanied by: self, father   PERTINENT HISTORY:   Patient is a 38 year old with diagnois of anoxic brain injury. Pt known to PT clinic. Previously seen for outpatient services March 2024. Pt was receiving HH PT until October 2024 per her mother's report. [name] reports pt using HHA for ambulation, was using RW but she says was told not to do to difficulty with safe technique. Pt has fallen 1x in the past six months trying to ambulate on her own. Pt requires assistance with ADLs, including bathing and dressing. [name] reports pt never indicates any pain. Pt with severe aphasia so is unable to answer questions/pt's mom provides hx. She reports pt has impaired short-term memory, difficulty with stair climbing (has stair lift at home). She reports pt's BLE are weaker than they used to be. The pt is probably limited to <10 min with  ambulation due to poor endurance. Pt reports her goal is to be able to use an AD to achieve mod-I mobility rather than relying on someone's HHA to ambulate.She was receiving outpatient PT until Feb. 2022 last year but ran out of visits. Patient has past medical history of Anoxic Brain Injury 09/2019, CAD, HLD, HTN, ISCHEMIC CARDIMYOPATHY, STEMI, V-FIB, ICD on 11/29/2020. Patient is currently living with mom. Mother reports she is abel to walk some with a walker with supervision but primarily ambulates with HHA of mother who is her guardian. Mother also reports she requires assist for all transfers and ADL's.   PAIN:  Are you having pain? No  PRECAUTIONS: Fall  WEIGHT BEARING RESTRICTIONS: No  FALLS: Has patient fallen in last 6 months? Yes. Number of falls 1  LIVING ENVIRONMENT: Lives with: lives with their family, mother Lives in: House/apartment Stairs: has stairs but stair lift installed, pt using lift Has following equipment at home: Environmental Consultant - 2 wheeled, Wheelchair (manual), shower chair, and Grab bars, pt uses stair lift  PLOF: Needs assistance with ADLs  PATIENT GOALS: balance, walking, and pt states basketball  OBJECTIVE:  Note: Objective measures were completed at Evaluation unless otherwise noted.  DIAGNOSTIC FINDINGS:  None recent in chart  COGNITION: Overall cognitive status: impaired   SENSATION: Unable to complete testing due to impaired cognition, pt unable to report if sensation impairment present, pt mother unsure   EDEMA:  Mom reports pt's feet do swell from time to time   LOWER EXTREMITY MMT:  *accuracy of testing likely impacted by pt difficulty following cues  MMT Right Eval Left Eval  Hip flexion 4+ 4+  Hip extension    Hip abduction 4+ 4+  Hip adduction 4+ 4+  Hip internal rotation    Hip external rotation    Knee flexion 4+ 4+  Knee extension 5 5  Ankle dorsiflexion    Ankle plantarflexion    Ankle inversion    Ankle eversion    (Blank rows  = not tested)  TRANSFERS: HHA  tried in session, very unsteady, pt has difficulty with motor planning, little success with cues due to distraction, very unsafe Attempted in session with RW, pt unsteady, gait ataxic, continued significant difficulty with motor planning requiring up to min a + 2 and multi-modal cues to safely sit in chair.   FUNCTIONAL TESTS:  5 times sit to stand: 1 min 43 sec Timed up and go (TUG): 1 min 30 sec with RW 10 meter walk test: 0.25 m/s with RW extensive cuing to complete activity  PATIENT SURVEYS:  SIS-16 filled out by pt's mother, used as questions relevant to pt: 45                                                                                                                              TREATMENT DATE: 02/04/2024   *Unless otherwise stated, CGA/min A was provided and gait belt donned in order to ensure pt safety throughout session.*  *Pt arrived to therapy session in transport chair with mother accompanying her in waiting area.* Transfer from transport chair into green arm rest chair, mod assist  for transition Overground ambulation, 1 lap, 169ft using 2WW w/ BTB tied posterior to pt LE for tactile feedback for close body placement within walker during ambulation x 2 sets  Seated break Sit<>stand w/ hand held assist from SPT, 2x5, verbal and tactile cue for placement of feet, and upright posture Static stand at whiteboard, move letters to work on dynamic stability; very challenging for patient to perform.  Seated soccer ball kicks for coordination and sequencing with timing of muscle recruitment x 15 kicks  Transport from green arm rest chair to transport chair using sit<>stand and pivot with 2WW with mod assist from PT and verbal cues for proper pivot technique and transition Pt wheeled out to mother in waiting area.   PATIENT EDUCATION: Education details: goal reassessment, indications, exercise technique Person educated: Patient and  Parent Education method: Explanation, Demonstration, Tactile cues, and Verbal cues Education comprehension: returned demonstration, verbal cues required, tactile cues required, and needs further education   HOME EXERCISE PROGRAM: Access Code: [email or web address] URL: [email or web address] Date: 06/11/2023 Prepared by: Massie Dollar  Exercises - Standing March with Counter Support  - 1 x daily - 7 x weekly - 2 sets - 8 reps - Side Stepping with Counter Support  - 1 x daily - 7 x weekly - 2 sets - 4 reps - Sit to Stand with Armchair  - 1 x daily - 7 x weekly - 2 sets - 5 reps - Seated March  - 1 x daily - 7 x weekly - 3 sets - 10 reps  GOALS: Goals reviewed with patient? Yes   SHORT TERM GOALS: Target date: 07/11/2023  Patient will be independent in home exercise program to improve strength/mobility for better functional independence with ADLs. Baseline: Goal status: INITIAL   LONG TERM GOALS: Target date: 04/28/2024    1.  Patient will complete five times sit to stand test in < 35 seconds indicating an increased LE strength and improved balance. Baseline: previously 37 sec with UUE 04/19/22 today 05/30/23: 1 min 43 seconds; 6/3: 41 sec with improved technique (use of BUE on armrests);  08/21/2023= 38 sec with some distractibility and VC for hand placement 7:23: 39.06 (41.23, 36.89 sec) 10/29/23: 56.85 with HHA; 1 min 16 sec with B UE on armrests 11/14/2023= 32.68 sec with BUE Support (VC for consistency of hand position)  12/05/23: 1 min 40 sec with R UE support (  VC for proper technique) 12/17: 41 seconds with UE support on chair Goal status: IN PROGRESS  2. Patient will demo ability to ambulate > 400 ft w/ LRAD versus hand held assist min assist to demonstrate improved household mobility and short community distances.  Baseline: 4/28: 179ft with RW. Mod assist for AD management to prevent veer to the R. 6/3: ~ 300 ft with RW, requires cuing throughout for proximity to RW  particularly with turns, but this has improved compared to eval;  08/21/2023= Patient ambulated approx 300 feet with RW- Sill some min assist at times to navigate walker and VC to remain close to walker.  7/23: 347ft with RW and min assist for safety and moderate instruction for safety AD intermittently, especially with fatigue  10/29/23:  Pt able to ambulate 500' with RW, however required modA at times to keep the walker stable 11/14/2023= Will assess next visit 12/05/23: Pt able to ambulate ~300' with RW and modA from therapist to keep the walker close/stable 12/17: 310 ft with RW with mod A for walker positioning  Goal status: IN PROGRESS  3.  Patient will increase 10 meter walk test to >1.25m/s as to improve gait speed for better community ambulation and to reduce fall risk. Baseline: previously 0.36 m/s on 04/19/22 : 0.25 m/s;  07/10/23: 0. 24 m/s with RW;  08/15/2023= 0.39 m/s  with RW yet max VC for RW navigation.  7/23:Average Normal speed with RW: 0.25 m/s Average Normal speed with HHA: 0.49m/s 10/29/23: Average Normal speed with RW: 0.23 m/s 11/14/2023= 0.28 m/s with RW (VC for navigation); 0.45 m/s with HHA 12/05/23: 0.18 m/s; 53.95 sec with RW  12/17: 38 seconds with RW  Goal status: PROGRESSING  4.  Patient will reduce timed up and go (TUG) to <30 seconds to reduce fall risk and demonstrate improved transfer/gait ability Baseline: previously 91 sec with RW on 04/19/22; 05/30/23: 1 min 30 sec with RW 07/19/2023: 48.265 seconds using RW with skilled min A primarily for AD management with pt having difficulty turning with AD;  08/21/2023- 33 sec with HHA and 1 min 25 sec with RW (Max VC for safety with turning back to seat and to reach back safely)  7/23:42.18sec with RW and min assist for safety in turn; 40.35 sec with HHA and min assist for safety with turns.  10/29/23: TBD 11/14/2023= 43 sec with RW with difficulty turning 180 at end and max VC to reach back; 31 sec with HHA today. VC for  turning.  12/17: 39 seconds with RW assistance for guiding walker for turn Goal status: MET/ progressed  5. Pt will exhibit correct and safe technique with stand>sit into a standard chair or transport chair to reduce risk of falling.   Baseline: pt current technique unsafe, poor motor planning; 6/3: pt with improved technique, bring RW closer to chair prior to sitting; 08/21/2023- Patient able to perform with CGA yet inconsistent with cues for safety and consistency with hand placement.  10/29/23: Pt still with inconsistent STS's, utilizing heavy UE assistance  11/14/2023= Patient continues to be inconsistent but performed around 10 times today and able to reach back and push up using UE appropriately 7/10 times.  Goal status: PARTIALLY MET   6. The pt will demonstrate at least a 15 point improvement on SIS-16 to indicate increased ease with ADLs. Baseline: 45 (filled out by pt's mother); 42; 08/21/2023= 46; 09/03/2023: 46/80 10/29/23: TBD 11/19/2023- Mother not present so will assess next visit she is present. 12/17:  unable to give today due to mom not present 12/22:  37 Goal status: ONGOING   ASSESSMENT:  CLINICAL IMPRESSION:  Patient goals performed 01/28/24, please refer to this note for further details. *** Pt will continue to benefit from skilled therapy to address remaining deficits in order to improve overall QoL and return to PLOF.       OBJECTIVE IMPAIRMENTS: Abnormal gait, decreased activity tolerance, decreased balance, decreased coordination, decreased endurance, decreased knowledge of use of DME, decreased mobility, difficulty walking, decreased strength, decreased safety awareness, increased edema, impaired tone, improper body mechanics, and postural dysfunction.   ACTIVITY LIMITATIONS: carrying, lifting, bending, standing, squatting, stairs, transfers, bathing, dressing, locomotion level, and caring for others  PARTICIPATION LIMITATIONS: meal prep, cleaning, laundry,  medication management, personal finances, driving, shopping, community activity, occupation, and yard work  PERSONAL FACTORS: Sex, Time since onset of injury/illness/exacerbation, and 1-2 comorbidities: per chart PMH includes CAD, HTN, ischemic cardiomyopathy, STEMI, ventricular fibrillation, dysphagia, s/p ICD, apraxia  are also affecting patient's functional outcome.   REHAB POTENTIAL: Fair    CLINICAL DECISION MAKING: Evolving/moderate complexity  EVALUATION COMPLEXITY: High  PLAN:  PT FREQUENCY: 1-2x/week  PT DURATION: 12 weeks  PLANNED INTERVENTIONS: 97164- PT Re-evaluation, 97750- Physical Performance Testing, 97110-Therapeutic exercises, 97530- Therapeutic activity, 97112- Neuromuscular re-education, 97535- Self Care, 02859- Manual therapy, [phone]- Gait training, [phone]- Orthotic Initial, [phone]- Orthotic/Prosthetic subsequent, [phone]- Canalith repositioning, Patient/Family education, Balance training, Stair training, Taping, Joint mobilization, Spinal mobilization, Vestibular training, DME instructions, Wheelchair mobility training, Cryotherapy, and Moist heat   PLAN FOR NEXT SESSION:     backwards gait training using RW with skilled assistance - perform in environment with limited distractions to allow improved motor learning Transfer training- learning sequence of AD management and stepping back to seat Gait training using RW in home-like environment Dynamic balance Reaching with use of Blaze Pods for tapping hands  Leotta Weingarten  Leopoldo, PT, DPT Physical Therapist - [facility]  Outpatient Physical Therapy- Main Campus [phone]    02/04/2024, 8:01 AM  "

## 2024-02-05 ENCOUNTER — Other Ambulatory Visit: Payer: Self-pay | Admitting: *Deleted

## 2024-02-06 ENCOUNTER — Ambulatory Visit

## 2024-02-06 NOTE — Patient Outreach (Addendum)
 Complex Care Management   Visit Note  02/06/2024  Name:  Beverly [name] MRN: [id number] DOB: 05-11-1985  Situation: Referral received for Complex Care Management related to need to file for guardianship of her grandschildren I obtained verbal consent from Guardian.  Visit completed with Guardian  on the phone on 02/05/24.  Background:   Past Medical History:  Diagnosis Date   Brain injury (HCC)    CAD (coronary artery disease)    HLD (hyperlipidemia)    Hypertension    last pregnancy   Ischemic cardiomyopathy    STEMI (ST elevation myocardial infarction) (HCC) 09/2019   SCAD with aneurysmal dilation of proximal LAD.   Vaginal Pap smear, abnormal    when she was 38yo   Ventricular fibrillation (HCC) 09/23/2019    Assessment: Patient Reported Symptoms:  Cognitive Cognitive Status: Able to follow simple commands, Requires Assistance Decision Making, Struggling with memory recall Cognitive/Intellectual Conditions Management [RPT]: Brain Injury Brain Injury: cardiac arrest following third child   Health Maintenance Behaviors: Annual physical exam Healing Pattern: Slow Health Facilitated by: Rest  Neurological Neurological Review of Symptoms: No symptoms reported    HEENT HEENT Symptoms Reported: No symptoms reported      Cardiovascular Cardiovascular Symptoms Reported: No symptoms reported    Respiratory Respiratory Symptoms Reported: No symptoms reported    Endocrine Endocrine Symptoms Reported: No symptoms reported Is patient diabetic?: No    Gastrointestinal Gastrointestinal Symptoms Reported: Incontinence Additional Gastrointestinal Details: uses a pure wick at night Gastrointestinal Management Strategies: Incontinence garment/pad    Genitourinary Genitourinary Symptoms Reported: Incontinence    Integumentary Integumentary Symptoms Reported: No symptoms reported    Musculoskeletal Musculoskelatal Symptoms Reviewed: Difficulty walking, Limited mobility, Unsteady  gait, Weakness Additional Musculoskeletal Details: working with outpatient PT on walking with walker   Falls in the past year?: Yes Number of falls in past year: 1 or less Was there an injury with Fall?: No Fall Risk Category Calculator: 1 Patient Fall Risk Level: Low Fall Risk    Psychosocial Psychosocial Symptoms Reported: No symptoms reported          02/06/2024    PHQ2-9 Depression Screening   Little interest or pleasure in doing things Not at all  Feeling down, depressed, or hopeless Not at all  PHQ-2 - Total Score 0  Trouble falling or staying asleep, or sleeping too much    Feeling tired or having little energy    Poor appetite or overeating     Feeling bad about yourself - or that you are a failure or have let yourself or your family down    Trouble concentrating on things, such as reading the newspaper or watching television    Moving or speaking so slowly that other people could have noticed.  Or the opposite - being so fidgety or restless that you have been moving around a lot more than usual    Thoughts that you would be better off dead, or hurting yourself in some way    PHQ2-9 Total Score    If you checked off any problems, how difficult have these problems made it for you to do your work, take care of things at home, or get along with other people    Depression Interventions/Treatment      There were no vitals filed for this visit. Pain Scale: 0-10 Pain Score: 0-No pain  Medications Reviewed Today     Reviewed by Ermalinda Lenn HERO, LCSW (Social Worker) on 02/05/24 at 1124  Med List Status: <None>  Medication Order Taking? Sig Documenting Provider Last Dose Status Informant  atorvastatin  (LIPITOR ) 40 MG tablet [id number] Yes TAKE 1 TABLET BY MOUTH EVERY DAY Hammock, Sheri, NP  Active   carbidopa -levodopa  (SINEMET  IR) 25-100 MG tablet [id number] Yes TAKE 1 TABLET BY MOUTH THREE TIMES A DAY. NEED PRIMARY INSURANCE Babs Arthea DASEN, MD  Active   carvedilol  (COREG )  3.125 MG tablet [id number] Yes TAKE 1 TABLET BY MOUTH 2 TIMES DAILY. Gerard Frederick, NP  Active   clobetasol (TEMOVATE) 0.05 % external solution [id number] Yes Apply topically 2 (two) times daily. [provider]  Active   [employer] ASPIRIN  ADULT LOW DOSE 81 MG chewable tablet [id number] Yes PLACE 1 TABLET (81 MG TOTAL) INTO FEEDING TUBE DAILY. Mady [name], MD  Active   Ensure Plus (ENSURE PLUS) BERNICE [id number] Yes Take by mouth. Drinks 5-6 cans per day [provider]  Active   Melatonin 5 MG CAPS [id number] Yes Place 5 mg into feeding tube at bedtime. [provider]  Active Mother  Multiple Vitamin (MULTIVITAMIN WITH MINERALS) TABS tablet [id number] Yes Place 1 tablet into feeding tube daily. [provider]  Active Mother  scopolamine  (TRANSDERM-SCOP) 1 MG/3DAYS [id number] Yes PLACE 1 PATCH ONTO THE SKIN EVERY 3 DAYS. Gerard Frederick, NP  Active   traZODone  (DESYREL ) 100 MG tablet [id number] Yes PLACE 1 TABLET (100 MG TOTAL) INTO FEEDING TUBE AT BEDTIME. Babs Arthea DASEN, MD  Active   triamcinolone cream (KENALOG) 0.1 % [id number] Yes SMARTSIG:Sparingly Topical Twice Daily [provider]  Active   Water  For Irrigation, Sterile (FREE WATER ) SOLN [id number] Yes Place 200 mLs into feeding tube every 6 (six) hours. Alto Isaiah CROME, NP  Active Mother            Recommendation:   PCP Follow-up Specialty provider follow-up as scheduled Complete application for guardianship provided by mail  Follow Up Plan:   Telephone follow up appointment date/time:  02/24/25 10am  Domonique Brouillard, LCSW [place]  [facility], [facility] Licensed Clinical Social Worker  Direct Dial: [phone]

## 2024-02-06 NOTE — Patient Instructions (Signed)
 Visit Information  Thank you for taking time to visit with me today. Please don't hesitate to contact me if I can be of assistance to you before our next scheduled appointment.  Our next appointment is by telephone on 02/25/24 at 10am Please call the care guide team at [phone] if you need to cancel or reschedule your appointment.   Following is a copy of your care plan:   Goals Addressed             This Visit's Progress    VBCI Social Work Care Plan       Problems:   Lacks knowledge of how to connect to resources related to obtaining guardianship of her granndchildren  CSW Clinical Goal(s):   Over the next 90 days the Guardian will work with the department of social services and the local court  to address needs related to kinship care.              Over the next 30 days patient's guardian  will complete the application for guardianship of her grandchildren and will file the paperwork with the local court Interventions:  Confirmed that patient's mother is patient's legal guardian and is requesting assistance with filing for guardianship of patient's 3 children               Discussed guardianship process and mailed guardianship application to be completed by patient's mother               Confirmed that patient is followed under CAP through  [facility] [employer]                 Patient Goals/Self-Care Activities:  Complete guardianship application to be filed with the local court  Plan:   Telephone follow up appointment with care management team member scheduled for:  02/25/24        Please call the Suicide and Crisis Lifeline: 988 call the [place]  National Suicide Prevention Lifeline: 929-366-5270 or TTY: 787-751-0573 TTY 217-827-1155) to talk to a trained counselor call 1-800-273-TALK (toll free, 24 hour hotline) call 911 if you are experiencing a Mental Health or Behavioral Health Crisis or need someone to talk to.  Legal Guardian (smart list) verbalized understanding  of Care plan and visit instructions communicated this visit  Evian Derringer, LCSW [place]  [facility], [facility] Licensed Clinical Social Worker  Direct Dial: [phone]

## 2024-02-11 ENCOUNTER — Ambulatory Visit: Attending: Physical Medicine & Rehabilitation

## 2024-02-11 ENCOUNTER — Ambulatory Visit

## 2024-02-12 ENCOUNTER — Telehealth: Payer: Self-pay

## 2024-02-12 NOTE — Patient Outreach (Signed)
 RNCM - mother called, no progress made on obtaining food since we last spoke and now alost completely out. States CAP SW not assisting at this time.   Conference call with Mom and [facility] - called x2 to speak with different people and neither aware of Nutrition Care Intake Team (provided by Angeline Pickett) and given local office number. Called local office and unable to hold due to patient appointment. Mom made aware will try again today and call her back with update.

## 2024-02-12 NOTE — Patient Outreach (Addendum)
 RNCM called [facility], spoke with Wanda, received fax number where order can be sent - [phone], states no Nutrition Care Team and to just send to fax.   Called PCP office - Nurse Almarie with a patient, left contact info and reason for call and was told she will call back today.   Spoke with Almarie - provided fax number and detailed info to be included in order as requested by Angeline Pickett at [employer]. She stated she will talk with supervisor now and aware can call me if further questions.   Called and spoke with mother Myrick and given info above and all contact numbers for her records. Aware to call me if does not hear back.

## 2024-02-12 NOTE — Telephone Encounter (Signed)
 Called Patient's mother to find out how much Ensure Enlive 350 she has to ensure she has Gtube food. Mother states she has about 10 days left of g tube food.

## 2024-02-12 NOTE — Telephone Encounter (Signed)
 Copied from CRM [id number]. Topic: General - Other >> Feb 12, 2024  4:16 PM Sophia H wrote: Reason for CRM: Needs to speak - speak with MA/Nurse for NP Vincente regarding the G tube feeding/orders. Please reach out # [phone].    Care manager for [place] [facility]

## 2024-02-13 ENCOUNTER — Telehealth: Payer: Self-pay

## 2024-02-13 ENCOUNTER — Ambulatory Visit

## 2024-02-13 ENCOUNTER — Ambulatory Visit: Attending: Physical Medicine & Rehabilitation

## 2024-02-13 DIAGNOSIS — R269 Unspecified abnormalities of gait and mobility: Secondary | ICD-10-CM | POA: Diagnosis present

## 2024-02-13 DIAGNOSIS — M6281 Muscle weakness (generalized): Secondary | ICD-10-CM | POA: Diagnosis present

## 2024-02-13 DIAGNOSIS — R2681 Unsteadiness on feet: Secondary | ICD-10-CM | POA: Diagnosis present

## 2024-02-13 DIAGNOSIS — R262 Difficulty in walking, not elsewhere classified: Secondary | ICD-10-CM | POA: Insufficient documentation

## 2024-02-13 NOTE — Telephone Encounter (Signed)
 Called [facility] and have received 3 different fax numbers and have faxed the order to all 3 fax numbers.

## 2024-02-13 NOTE — Telephone Encounter (Signed)
 Called [facility] again and [facility] finally states they can see the order for the G tube food and supplies and Therisa at [employer] states she manually put the order in due to the Patient being low on these supplies. I tried to confirm about how long it would take for the Patient to receive the supplies but Therisa states that if there is any questions that needs to be answered or if the order is cancelled that someone from [facility] will call the clinic and let us  know. First started faxing this order to [facility] on 01/11/24. I found out on 02/12/24 that the Patient had not received any supplies so I called [facility] this morning and was gave a different fax number then called back to confirm the order had been received and they gave me a third fax number which apparently is the correct number ([phone]).

## 2024-02-13 NOTE — Telephone Encounter (Signed)
 Per our discussion, pt had enough supplies for 1.5 weeks. Reviewed chart. Per 01/10/24 phone messages/notes - there was a DME order faxed to [facility] for supplies (including Ensure). Did they receive this fax. Is there something else needed? Do we need to re fax?

## 2024-02-13 NOTE — Telephone Encounter (Signed)
 No. Just wanted everyone to be aware of the situation. Thank You for asking!

## 2024-02-13 NOTE — Telephone Encounter (Signed)
 Noted, thanks for following up with this

## 2024-02-13 NOTE — Therapy (Addendum)
 " OUTPATIENT PHYSICAL THERAPY TREATMENT/ RECERT      Patient Name: Beverly [name] MRN: [id number] DOB:30-Mar-1985, 39 y.o., female Today's Date: 02/13/2024  [facility]: [facility] REFERRING PROVIDER: Babs Arthea DASEN, MD  END OF SESSION:   PT End of Session - 02/13/24 1356     Visit Number 52    Number of Visits 76    Date for Recertification  05/07/24    Authorization Type UHC Dual Complete; Medicaid secondary    Progress Note Due on Visit 40    PT Start Time 1400    PT Stop Time 1444    PT Time Calculation (min) 44 min    Equipment Utilized During Treatment Gait belt    Activity Tolerance Patient tolerated treatment well    Behavior During Therapy Flat affect;Impulsive;WFL for tasks assessed/performed              Past Medical History:  Diagnosis Date   Brain injury [facility])    CAD (coronary artery disease)    HLD (hyperlipidemia)    Hypertension    last pregnancy   Ischemic cardiomyopathy    STEMI (ST elevation myocardial infarction) (HCC) 09/2019   SCAD with aneurysmal dilation of proximal LAD.   Vaginal Pap smear, abnormal    when she was 39yo   Ventricular fibrillation (HCC) 09/23/2019   Past Surgical History:  Procedure Laterality Date   CARDIAC CATHETERIZATION     IR GASTROSTOMY TUBE MOD SED  10/07/2019   IR GASTROSTOMY TUBE REMOVAL  07/02/2020   IR REPLACE G-TUBE SIMPLE WO FLUORO  09/28/2023   IR REPLC GASTRO/COLONIC TUBE PERCUT W/FLUORO  11/03/2021   IR REPLC GASTRO/COLONIC TUBE PERCUT W/FLUORO  03/09/2023   LEFT HEART CATH AND CORONARY ANGIOGRAPHY N/A 09/23/2019   Procedure: LEFT HEART CATH AND CORONARY ANGIOGRAPHY;  Surgeon: Mady [name], MD;  Location: [facility] INVASIVE CV LAB;  Service: Cardiovascular;  Laterality: N/A;   SUBQ ICD IMPLANT N/A 11/26/2020   Procedure: SUBQ ICD IMPLANT;  Surgeon: [name], MD;  Location: [facility] INVASIVE CV LAB;  Service: Cardiovascular;  Laterality: N/A;   Patient Active Problem List   Diagnosis  Date Noted   Nocturnal enuresis 01/01/2024   Hyperlipidemia 12/01/2023   Apraxia 10/05/2021   S/P ICD (internal cardiac defibrillator) procedure 11/27/2020   Hx of Cardiac arrest (HCC) 11/26/2020   Abnormality of gait 03/16/2020   Sleep disturbance 03/16/2020   Oropharyngeal dysphagia 02/12/2020   Coronary artery disease involving native coronary artery of native heart without angina pectoris 12/12/2019   Ischemic cardiomyopathy 12/12/2019   Brain injury (HCC)    Prediabetes    Anoxic brain injury (HCC) 11/05/2019   Dysphagia 11/05/2019   Physical deconditioning 11/05/2019   Acute delirium 11/05/2019   Respiratory failure (HCC)    Encounter for central line placement    Ventricular fibrillation (HCC) 09/23/2019   Acute combined systolic and diastolic heart failure (HCC) 09/23/2019   Ventricular tachycardia, polymorphic (HCC) 09/23/2019   Pelvic pain affecting pregnancy 10/15/2015   Supervision of normal pregnancy in third trimester 09/28/2015   Poor weight gain of pregnancy 09/28/2015   Iron  deficiency anemia of pregnancy 09/01/2015   Increased BMI (body mass index) 07/29/2015    ONSET DATE: August 2021  REFERRING DIAG:  G93.1 (ICD-10-CM) - Anoxic brain injury (HCC)  R48.2 (ICD-10-CM) - Apraxia    THERAPY DIAG:   Muscle weakness (generalized) - Plan: PT plan of care cert/re-cert  Difficulty in walking, not elsewhere classified - Plan: PT plan of  care cert/re-cert  Abnormality of gait and mobility - Plan: PT plan of care cert/re-cert  Unsteadiness on feet - Plan: PT plan of care cert/re-cert  Rationale for Evaluation and Treatment: Rehabilitation  SUBJECTIVE:                                                                                                                                                                                             SUBJECTIVE STATEMENT:  Patient presents with father, father states no changes.   Pt accompanied by: self,  father   PERTINENT HISTORY:   Patient is a 39 year old with diagnois of anoxic brain injury. Pt known to PT clinic. Previously seen for outpatient services March 2024. Pt was receiving HH PT until October 2024 per her mother's report. [name] reports pt using HHA for ambulation, was using RW but she says was told not to do to difficulty with safe technique. Pt has fallen 1x in the past six months trying to ambulate on her own. Pt requires assistance with ADLs, including bathing and dressing. [name] reports pt never indicates any pain. Pt with severe aphasia so is unable to answer questions/pt's mom provides hx. She reports pt has impaired short-term memory, difficulty with stair climbing (has stair lift at home). She reports pt's BLE are weaker than they used to be. The pt is probably limited to <10 min with ambulation due to poor endurance. Pt reports her goal is to be able to use an AD to achieve mod-I mobility rather than relying on someone's HHA to ambulate.She was receiving outpatient PT until Feb. 2022 last year but ran out of visits. Patient has past medical history of Anoxic Brain Injury 09/2019, CAD, HLD, HTN, ISCHEMIC CARDIMYOPATHY, STEMI, V-FIB, ICD on 11/29/2020. Patient is currently living with mom. Mother reports she is abel to walk some with a walker with supervision but primarily ambulates with HHA of mother who is her guardian. Mother also reports she requires assist for all transfers and ADL's.   PAIN:  Are you having pain? No  PRECAUTIONS: Fall  WEIGHT BEARING RESTRICTIONS: No  FALLS: Has patient fallen in last 6 months? Yes. Number of falls 1  LIVING ENVIRONMENT: Lives with: lives with their family, mother Lives in: House/apartment Stairs: has stairs but stair lift installed, pt using lift Has following equipment at home: Environmental Consultant - 2 wheeled, Wheelchair (manual), shower chair, and Grab bars, pt uses stair lift  PLOF: Needs assistance with ADLs  PATIENT GOALS:  balance, walking, and pt states basketball  OBJECTIVE:  Note: Objective measures were completed at Evaluation unless otherwise noted.  DIAGNOSTIC FINDINGS:  None recent in chart  COGNITION: Overall cognitive status: impaired   SENSATION: Unable to complete testing due to impaired cognition, pt unable to report if sensation impairment present, pt mother unsure   EDEMA:  Mom reports pt's feet do swell from time to time   LOWER EXTREMITY MMT:  *accuracy of testing likely impacted by pt difficulty following cues  MMT Right Eval Left Eval  Hip flexion 4+ 4+  Hip extension    Hip abduction 4+ 4+  Hip adduction 4+ 4+  Hip internal rotation    Hip external rotation    Knee flexion 4+ 4+  Knee extension 5 5  Ankle dorsiflexion    Ankle plantarflexion    Ankle inversion    Ankle eversion    (Blank rows = not tested)  TRANSFERS: HHA  tried in session, very unsteady, pt has difficulty with motor planning, little success with cues due to distraction, very unsafe Attempted in session with RW, pt unsteady, gait ataxic, continued significant difficulty with motor planning requiring up to min a + 2 and multi-modal cues to safely sit in chair.   FUNCTIONAL TESTS:  5 times sit to stand: 1 min 43 sec Timed up and go (TUG): 1 min 30 sec with RW 10 meter walk test: 0.25 m/s with RW extensive cuing to complete activity  PATIENT SURVEYS:  SIS-16 filled out by pt's mother, used as questions relevant to pt: 45                                                                                                                              TREATMENT DATE: 02/13/2024   *Unless otherwise stated, CGA/min A was provided and gait belt donned in order to ensure pt safety throughout session.*  *Pt arrived to therapy session in transport chair with father accompanying her in waiting area.* Transfer from transport chair into green arm rest chair, mod assist  for transition Overground ambulation, 1 lap,  12ft using 2WW w/ BTB tied posterior to pt LE for tactile feedback for close body placement within walker during ambulation x 2 sets  Seated break- one rest break due to spitting on self   Pick up ball in standing, toss/dunk into hoop, using lumbrical grip; very challenging for patient to perform x multiple trials Backwards ambulation max A for walker x 10 ft Sit<>stand w/ hand held assist from PT, 2x5, verbal and tactile cue for placement of feet, and upright posture  Seated soccer ball kicks for coordination and sequencing with timing of muscle recruitment x 15 kicks  GTB march 15x each LE.  Transport from green arm rest chair to transport chair using sit<>stand and pivot with 2WW with mod assist from PT and verbal cues for proper pivot technique and transition    PATIENT EDUCATION: Education details: goal reassessment, indications, exercise technique Person educated: Patient and Parent Education method: Explanation, Demonstration, Tactile cues, and Verbal cues Education comprehension: returned demonstration, verbal cues required,  tactile cues required, and needs further education   HOME EXERCISE PROGRAM: Access Code: [email or web address] URL: [email or web address] Date: 06/11/2023 Prepared by: Massie Dollar  Exercises - Standing March with Counter Support  - 1 x daily - 7 x weekly - 2 sets - 8 reps - Side Stepping with Counter Support  - 1 x daily - 7 x weekly - 2 sets - 4 reps - Sit to Stand with Armchair  - 1 x daily - 7 x weekly - 2 sets - 5 reps - Seated March  - 1 x daily - 7 x weekly - 3 sets - 10 reps  GOALS: Goals reviewed with patient? Yes   SHORT TERM GOALS: Target date: 07/11/2023  Patient will be independent in home exercise program to improve strength/mobility for better functional independence with ADLs. Baseline: Goal status: INITIAL   LONG TERM GOALS: Target date:05/07/2024    1.  Patient will complete five times sit to stand test in < 35 seconds  indicating an increased LE strength and improved balance. Baseline: previously 37 sec with UUE 04/19/22 today 05/30/23: 1 min 43 seconds; 6/3: 41 sec with improved technique (use of BUE on armrests);  08/21/2023= 38 sec with some distractibility and VC for hand placement 7:23: 39.06 (41.23, 36.89 sec) 10/29/23: 56.85 with HHA; 1 min 16 sec with B UE on armrests 11/14/2023= 32.68 sec with BUE Support (VC for consistency of hand position)  12/05/23: 1 min 40 sec with R UE support (VC for proper technique) 12/17: 41 seconds with UE support on chair Goal status: IN PROGRESS  2. Patient will demo ability to ambulate > 400 ft w/ LRAD versus hand held assist min assist to demonstrate improved household mobility and short community distances.  Baseline: 4/28: 165ft with RW. Mod assist for AD management to prevent veer to the R. 6/3: ~ 300 ft with RW, requires cuing throughout for proximity to RW particularly with turns, but this has improved compared to eval;  08/21/2023= Patient ambulated approx 300 feet with RW- Sill some min assist at times to navigate walker and VC to remain close to walker.  7/23: 358ft with RW and min assist for safety and moderate instruction for safety AD intermittently, especially with fatigue  10/29/23:  Pt able to ambulate 500' with RW, however required modA at times to keep the walker stable 11/14/2023= Will assess next visit 12/05/23: Pt able to ambulate ~300' with RW and modA from therapist to keep the walker close/stable 12/17: 310 ft with RW with mod A for walker positioning  Goal status: IN PROGRESS  3.  Patient will increase 10 meter walk test to >1.16m/s as to improve gait speed for better community ambulation and to reduce fall risk. Baseline: previously 0.36 m/s on 04/19/22 : 0.25 m/s;  07/10/23: 0. 24 m/s with RW;  08/15/2023= 0.39 m/s  with RW yet max VC for RW navigation.  7/23:Average Normal speed with RW: 0.25 m/s Average Normal speed with HHA: 0.41m/s 10/29/23: Average  Normal speed with RW: 0.23 m/s 11/14/2023= 0.28 m/s with RW (VC for navigation); 0.45 m/s with HHA 12/05/23: 0.18 m/s; 53.95 sec with RW  12/17: 38 seconds with RW  Goal status: PROGRESSING  4.  Patient will reduce timed up and go (TUG) to <30 seconds to reduce fall risk and demonstrate improved transfer/gait ability Baseline: previously 91 sec with RW on 04/19/22; 05/30/23: 1 min 30 sec with RW 07/19/2023: 48.265 seconds using RW with skilled min A primarily for AD management with pt  having difficulty turning with AD;  08/21/2023- 33 sec with HHA and 1 min 25 sec with RW (Max VC for safety with turning back to seat and to reach back safely)  7/23:42.18sec with RW and min assist for safety in turn; 40.35 sec with HHA and min assist for safety with turns.  10/29/23: TBD 11/14/2023= 43 sec with RW with difficulty turning 180 at end and max VC to reach back; 31 sec with HHA today. VC for turning.  12/17: 39 seconds with RW assistance for guiding walker for turn Goal status: MET/ progressed  5. Pt will exhibit correct and safe technique with stand>sit into a standard chair or transport chair to reduce risk of falling.   Baseline: pt current technique unsafe, poor motor planning; 6/3: pt with improved technique, bring RW closer to chair prior to sitting; 08/21/2023- Patient able to perform with CGA yet inconsistent with cues for safety and consistency with hand placement.  10/29/23: Pt still with inconsistent STS's, utilizing heavy UE assistance  11/14/2023= Patient continues to be inconsistent but performed around 10 times today and able to reach back and push up using UE appropriately 7/10 times.  Goal status: PARTIALLY MET   6. The pt will demonstrate at least a 15 point improvement on SIS-16 to indicate increased ease with ADLs. Baseline: 45 (filled out by pt's mother); 42; 08/21/2023= 46; 09/03/2023: 46/80 10/29/23: TBD 11/19/2023- Mother not present so will assess next visit she is present. 12/17:  unable to give today due to mom not present 12/22:  37 Goal status: ONGOING   ASSESSMENT:  CLINICAL IMPRESSION:  Patient goals performed 01/28/24 and 01/23/24, two sessions ago, please refer to this note for further details. Patient tolerates progressive stability and mobility interventions. Patient is more challenged with attention span this session requiring multimodal cueing for task orientation. Pt will continue to benefit from skilled therapy to address remaining deficits in order to improve overall QoL and return to PLOF.       OBJECTIVE IMPAIRMENTS: Abnormal gait, decreased activity tolerance, decreased balance, decreased coordination, decreased endurance, decreased knowledge of use of DME, decreased mobility, difficulty walking, decreased strength, decreased safety awareness, increased edema, impaired tone, improper body mechanics, and postural dysfunction.   ACTIVITY LIMITATIONS: carrying, lifting, bending, standing, squatting, stairs, transfers, bathing, dressing, locomotion level, and caring for others  PARTICIPATION LIMITATIONS: meal prep, cleaning, laundry, medication management, personal finances, driving, shopping, community activity, occupation, and yard work  PERSONAL FACTORS: Sex, Time since onset of injury/illness/exacerbation, and 1-2 comorbidities: per chart PMH includes CAD, HTN, ischemic cardiomyopathy, STEMI, ventricular fibrillation, dysphagia, s/p ICD, apraxia  are also affecting patient's functional outcome.   REHAB POTENTIAL: Fair    CLINICAL DECISION MAKING: Evolving/moderate complexity  EVALUATION COMPLEXITY: High  PLAN:  PT FREQUENCY: 1-2x/week  PT DURATION: 12 weeks  PLANNED INTERVENTIONS: 97164- PT Re-evaluation, 97750- Physical Performance Testing, 97110-Therapeutic exercises, 97530- Therapeutic activity, 97112- Neuromuscular re-education, 97535- Self Care, 02859- Manual therapy, [phone]- Gait training, [phone]- Orthotic Initial, [phone]-  Orthotic/Prosthetic subsequent, [phone]- Canalith repositioning, Patient/Family education, Balance training, Stair training, Taping, Joint mobilization, Spinal mobilization, Vestibular training, DME instructions, Wheelchair mobility training, Cryotherapy, and Moist heat   PLAN FOR NEXT SESSION:     backwards gait training using RW with skilled assistance - perform in environment with limited distractions to allow improved motor learning Transfer training- learning sequence of AD management and stepping back to seat Gait training using RW in home-like environment Dynamic balance Reaching with use of Blaze Pods for tapping hands  Shamirah Ivan  Leopoldo, PT, DPT Physical Therapist - [facility]  Outpatient Physical Therapy- Main Campus [phone]    02/13/2024, 2:45 PM  "

## 2024-02-13 NOTE — Telephone Encounter (Signed)
 Called [facility] to confirm if they received the order for the G tube food and supplies. [employer] confirmed No order was received for G tube food and supplies so I confirmed the fax number and faxed to the new number. This order has been faxed to 3 different fax numbers per [facility].

## 2024-02-13 NOTE — Telephone Encounter (Signed)
 Called [facility] to see if they received the order for the nutritional supplies for the g tube that was faxed on 01/11/24. [facility] stated they did not receive the order and gave me a different fax number so I faxed the order to the new fax number.

## 2024-02-14 ENCOUNTER — Other Ambulatory Visit: Payer: Self-pay

## 2024-02-14 ENCOUNTER — Telehealth: Payer: Self-pay

## 2024-02-14 DIAGNOSIS — G931 Anoxic brain damage, not elsewhere classified: Secondary | ICD-10-CM

## 2024-02-14 DIAGNOSIS — R1312 Dysphagia, oropharyngeal phase: Secondary | ICD-10-CM

## 2024-02-14 DIAGNOSIS — I469 Cardiac arrest, cause unspecified: Secondary | ICD-10-CM

## 2024-02-14 DIAGNOSIS — N3944 Nocturnal enuresis: Secondary | ICD-10-CM

## 2024-02-14 DIAGNOSIS — R1310 Dysphagia, unspecified: Secondary | ICD-10-CM

## 2024-02-14 NOTE — Telephone Encounter (Signed)
 Called [facility] at [phone] to let them know all information they requested has been sent over and if there is any problems to please call Almarie back and let her know. I also let them know the Patient is almost out of G tube food and supplies.

## 2024-02-14 NOTE — Telephone Encounter (Signed)
 Edited the order for the G tube food and supplies so it would say she uses 6 cans per day of Ensure Enlive Vanilla all in one and the Patient's height , weight and BMI. Doctor of the day signed it since the Patient's PCP is out of the office and the order and the last progress note was faxed to [facility] at [phone].

## 2024-02-14 NOTE — Telephone Encounter (Signed)
 THANK YOU! And Noted.

## 2024-02-14 NOTE — Telephone Encounter (Signed)
Thank you Toya! 

## 2024-02-14 NOTE — Telephone Encounter (Signed)
 I called [employer] and they stated this is a new start for this patient. I informed them the pt is very low on her supply and we need this expedited. They will notify they team and have them start processing tomorrow. They will contact us  if they need anything else.

## 2024-02-14 NOTE — Telephone Encounter (Signed)
 Copied from CRM [id number]. Topic: Clinical - Prescription Issue >> Feb 14, 2024 12:37 PM Harlene ORN wrote: Reason for CRM: [facility] - [facility]  Returning a call from [place]. Regarding the order for the Ensure. calling to see if they can expedite the shipping. Needs the order updated to the amount of Ensure to take daily and latest doctor's note is needed.  Phone: [phone], option 2 Fax: [phone]

## 2024-02-14 NOTE — Telephone Encounter (Signed)
See other phone note for response.

## 2024-02-14 NOTE — Telephone Encounter (Signed)
 Noted. Please confirm they have definitely received. Want to make sure she is able to get her supplies. If having problems, can the case manager you spoke to help - to confirm information is received.

## 2024-02-15 NOTE — Telephone Encounter (Unsigned)
 Copied from CRM [id number]. Topic: General - Call Back - No Documentation >> Feb 15, 2024  9:58 AM Alfonso ORN wrote: Reason for CRM: [facility] called to return call from elizabeth to f/u. CAL stated Almarie not available . [facility] requesting a callback stating they have given callback number multiple times and needs to be resolved urgently. Please call to update

## 2024-02-15 NOTE — Telephone Encounter (Signed)
 Called [facility] which states the Patient needs a PA for the G tube food and supplies before they can ship it. This is HIGH PRIORITY due to the Patient ONLY has a FEW DAYS of G tube food left.

## 2024-02-15 NOTE — Telephone Encounter (Signed)
 Called [facility] at [phone] and spoke with Mep to find out if they received the G tube food and supplies order and to let her know that the Patient is almost out of supplies. Mep confirmed that [facility] did receive the order for the G tube food and supplies and she will make sure they know that the Patient is low on supplies.

## 2024-02-18 ENCOUNTER — Ambulatory Visit

## 2024-02-20 ENCOUNTER — Ambulatory Visit

## 2024-02-22 ENCOUNTER — Telehealth: Payer: Self-pay

## 2024-02-22 ENCOUNTER — Telehealth: Payer: Self-pay | Admitting: *Deleted

## 2024-02-22 ENCOUNTER — Other Ambulatory Visit: Payer: Self-pay

## 2024-02-22 DIAGNOSIS — I469 Cardiac arrest, cause unspecified: Secondary | ICD-10-CM

## 2024-02-22 DIAGNOSIS — R1310 Dysphagia, unspecified: Secondary | ICD-10-CM

## 2024-02-22 DIAGNOSIS — G931 Anoxic brain damage, not elsewhere classified: Secondary | ICD-10-CM

## 2024-02-22 NOTE — Telephone Encounter (Signed)
 Faxed [facility] a order for 60 cc Enfit syringes and received a ok confirmation.

## 2024-02-22 NOTE — Telephone Encounter (Signed)
 Copied from CRM [id number]. Topic: Clinical - Order For Equipment >> Feb 22, 2024  2:51 PM China J wrote: Reason for CRM: Patient's mother calling in to request 60cc Enfit Syringes to [facility].  Please call [name] at [phone] for an update.

## 2024-02-25 ENCOUNTER — Other Ambulatory Visit: Payer: Self-pay | Admitting: *Deleted

## 2024-02-25 ENCOUNTER — Encounter

## 2024-02-25 ENCOUNTER — Ambulatory Visit

## 2024-02-25 DIAGNOSIS — R2681 Unsteadiness on feet: Secondary | ICD-10-CM

## 2024-02-25 DIAGNOSIS — M6281 Muscle weakness (generalized): Secondary | ICD-10-CM | POA: Diagnosis not present

## 2024-02-25 DIAGNOSIS — R269 Unspecified abnormalities of gait and mobility: Secondary | ICD-10-CM

## 2024-02-25 DIAGNOSIS — R262 Difficulty in walking, not elsewhere classified: Secondary | ICD-10-CM

## 2024-02-25 NOTE — Patient Instructions (Signed)
 Visit Information  Thank you for taking time to visit with me today. Please don't hesitate to contact me if I can be of assistance to you before our next scheduled appointment.  Your next care management appointment is by telephone on 03/04/24 at 10am    Please call the care guide team at [phone] if you need to cancel, schedule, or reschedule an appointment.   Please call the Suicide and Crisis Lifeline: 988 call the [place]  National Suicide Prevention Lifeline: 423 634 4035 or TTY: 561-309-0273 TTY 971-835-1036) to talk to a trained counselor call 1-800-273-TALK (toll free, 24 hour hotline) call 911 if you are experiencing a Mental Health or Behavioral Health Crisis or need someone to talk to.  Kerney Hopfensperger, LCSW [place]  [facility], [facility] Licensed Clinical Social Worker  Direct Dial: [phone]

## 2024-02-25 NOTE — Telephone Encounter (Signed)
 Order the 60 cc Enfit syringes and faxed order to [facility] and received an ok confirmation.

## 2024-02-25 NOTE — Patient Outreach (Signed)
 Complex Care Management   Visit Note  02/25/2024  Name:  Beverly [name] MRN: [id number] DOB: [birth year]  Situation: Referral received for Complex Care Management related to need to file for guardianship of her grandschildren I obtained verbal consent from Guardian.  Visit completed with Guardian  on the phone  Background:   Past Medical History:  Diagnosis Date   Brain injury [facility])    CAD (coronary artery disease)    HLD (hyperlipidemia)    Hypertension    last pregnancy   Ischemic cardiomyopathy    STEMI (ST elevation myocardial infarction) (HCC) 09/2019   SCAD with aneurysmal dilation of proximal LAD.   Vaginal Pap smear, abnormal    when she was 39yo   Ventricular fibrillation (HCC) 09/23/2019    Assessment: patient's mother currently has guardianship of patient and is now providing care for patient's 3 minor children. She is requesting assistance with applying for guardianship of her 3 grandchildren. Patient's mother has received the forms, needs additional assistance with completion Patient Reported Symptoms:  Cognitive Cognitive Status: Able to follow simple commands, Requires Assistance Decision Making, Struggling with memory recall Cognitive/Intellectual Conditions Management [RPT]: Brain Injury Brain Injury: cardiac arresct following third child   Health Maintenance Behaviors: Annual physical exam Healing Pattern: Slow Health Facilitated by: Rest  Neurological Neurological Review of Symptoms: No symptoms reported    HEENT HEENT Symptoms Reported: No symptoms reported      Cardiovascular Cardiovascular Symptoms Reported: No symptoms reported Cardiovascular Comment: has appointment with Cardiologist on 02/27/24  Respiratory Respiratory Symptoms Reported: No symptoms reported    Endocrine Endocrine Symptoms Reported: No symptoms reported    Gastrointestinal Gastrointestinal Symptoms Reported: Incontinence Additional Gastrointestinal Details: continues to use a pure  wick at night      Genitourinary Genitourinary Symptoms Reported: Incontinence Genitourinary Management Strategies: Incontinence garment/pad  Integumentary Integumentary Symptoms Reported: No symptoms reported    Musculoskeletal Musculoskelatal Symptoms Reviewed: Difficulty walking, Limited mobility, Weakness Additional Musculoskeletal Details: continues to work with outpatient PT and speech        Psychosocial Psychosocial Symptoms Reported: No symptoms reported          02/25/2024    PHQ2-9 Depression Screening   Little interest or pleasure in doing things    Feeling down, depressed, or hopeless    PHQ-2 - Total Score    Trouble falling or staying asleep, or sleeping too much    Feeling tired or having little energy    Poor appetite or overeating     Feeling bad about yourself - or that you are a failure or have let yourself or your family down    Trouble concentrating on things, such as reading the newspaper or watching television    Moving or speaking so slowly that other people could have noticed.  Or the opposite - being so fidgety or restless that you have been moving around a lot more than usual    Thoughts that you would be better off dead, or hurting yourself in some way    PHQ2-9 Total Score    If you checked off any problems, how difficult have these problems made it for you to do your work, take care of things at home, or get along with other people    Depression Interventions/Treatment      There were no vitals filed for this visit.    Medications Reviewed Today     Reviewed by Ermalinda Lenn HERO, LCSW (Social Worker) on 02/25/24 at 1036  Med List  Status: <None>   Medication Order Taking? Sig Documenting Provider Last Dose Status Informant  atorvastatin  (LIPITOR ) 40 MG tablet [id number] Yes TAKE 1 TABLET BY MOUTH EVERY DAY Hammock, Sheri, NP  Active   carbidopa -levodopa  (SINEMET  IR) 25-100 MG tablet [id number] Yes TAKE 1 TABLET BY MOUTH THREE TIMES A DAY. NEED  PRIMARY INSURANCE Babs Arthea DASEN, MD  Active   carvedilol  (COREG ) 3.125 MG tablet [id number] Yes TAKE 1 TABLET BY MOUTH 2 TIMES DAILY. Gerard Frederick, NP  Active   clobetasol (TEMOVATE) 0.05 % external solution [id number] Yes Apply topically 2 (two) times daily. [provider]  Active   [employer] ASPIRIN  ADULT LOW DOSE 81 MG chewable tablet [id number] Yes PLACE 1 TABLET (81 MG TOTAL) INTO FEEDING TUBE DAILY. Mady [name], MD  Active   Ensure Plus (ENSURE PLUS) BERNICE [id number] Yes Take by mouth. Drinks 5-6 cans per day [provider]  Active   Melatonin 5 MG CAPS [id number] Yes Place 5 mg into feeding tube at bedtime. [provider]  Active Mother  Multiple Vitamin (MULTIVITAMIN WITH MINERALS) TABS tablet [id number] Yes Place 1 tablet into feeding tube daily. [provider]  Active Mother  scopolamine  (TRANSDERM-SCOP) 1 MG/3DAYS [id number] Yes PLACE 1 PATCH ONTO THE SKIN EVERY 3 DAYS. Gerard Frederick, NP  Active   traZODone  (DESYREL ) 100 MG tablet [id number] Yes PLACE 1 TABLET (100 MG TOTAL) INTO FEEDING TUBE AT BEDTIME. Babs Arthea DASEN, MD  Active   triamcinolone cream (KENALOG) 0.1 % [id number] Yes SMARTSIG:Sparingly Topical Twice Daily [provider]  Active   Water  For Irrigation, Sterile (FREE WATER ) SOLN [id number] Yes Place 200 mLs into feeding tube every 6 (six) hours. Alto Isaiah CROME, NP  Active Mother            Recommendation:   PCP Follow-up Specialty provider follow-up as scheduled Continue Current Plan of Care Continue to review guardianship application for patient's children   Follow Up Plan:   Telephone follow up appointment date/time:  03/04/24 10am  Kahlen Boyde, LCSW [place]  [facility] Licensed Clinical Social Worker  Direct Dial: [phone]

## 2024-02-25 NOTE — Therapy (Signed)
 " OUTPATIENT PHYSICAL THERAPY TREATMENT     Patient Name: Beverly [name] MRN: [id number] DOB:1985-12-02, 39 y.o., female Today's Date: 02/25/2024  PCP: [facility] REFERRING PROVIDER: Babs Arthea DASEN, MD  END OF SESSION:   PT End of Session - 02/25/24 1417     Visit Number 53    Number of Visits 76    Date for Recertification  05/07/24    Authorization Type UHC Dual Complete; Medicaid secondary    Progress Note Due on Visit 40    PT Start Time 1417    PT Stop Time 1444    PT Time Calculation (min) 27 min    Equipment Utilized During Treatment Gait belt    Activity Tolerance Patient tolerated treatment well    Behavior During Therapy Flat affect;Impulsive;WFL for tasks assessed/performed               Past Medical History:  Diagnosis Date   Brain injury [facility])    CAD (coronary artery disease)    HLD (hyperlipidemia)    Hypertension    last pregnancy   Ischemic cardiomyopathy    STEMI (ST elevation myocardial infarction) (HCC) 09/2019   SCAD with aneurysmal dilation of proximal LAD.   Vaginal Pap smear, abnormal    when she was 39yo   Ventricular fibrillation (HCC) 09/23/2019   Past Surgical History:  Procedure Laterality Date   CARDIAC CATHETERIZATION     IR GASTROSTOMY TUBE MOD SED  10/07/2019   IR GASTROSTOMY TUBE REMOVAL  07/02/2020   IR REPLACE G-TUBE SIMPLE WO FLUORO  09/28/2023   IR REPLC GASTRO/COLONIC TUBE PERCUT W/FLUORO  11/03/2021   IR REPLC GASTRO/COLONIC TUBE PERCUT W/FLUORO  03/09/2023   LEFT HEART CATH AND CORONARY ANGIOGRAPHY N/A 09/23/2019   Procedure: LEFT HEART CATH AND CORONARY ANGIOGRAPHY;  Surgeon: Mady [name], MD;  Location: [facility] INVASIVE CV LAB;  Service: Cardiovascular;  Laterality: N/A;   SUBQ ICD IMPLANT N/A 11/26/2020   Procedure: SUBQ ICD IMPLANT;  Surgeon: [name], MD;  Location: [facility] INVASIVE CV LAB;  Service: Cardiovascular;  Laterality: N/A;   Patient Active Problem List   Diagnosis Date  Noted   Nocturnal enuresis 01/01/2024   Hyperlipidemia 12/01/2023   Apraxia 10/05/2021   S/P ICD (internal cardiac defibrillator) procedure 11/27/2020   Hx of Cardiac arrest (HCC) 11/26/2020   Abnormality of gait 03/16/2020   Sleep disturbance 03/16/2020   Oropharyngeal dysphagia 02/12/2020   Coronary artery disease involving native coronary artery of native heart without angina pectoris 12/12/2019   Ischemic cardiomyopathy 12/12/2019   Brain injury (HCC)    Prediabetes    Anoxic brain injury (HCC) 11/05/2019   Dysphagia 11/05/2019   Physical deconditioning 11/05/2019   Acute delirium 11/05/2019   Respiratory failure (HCC)    Encounter for central line placement    Ventricular fibrillation (HCC) 09/23/2019   Acute combined systolic and diastolic heart failure (HCC) 09/23/2019   Ventricular tachycardia, polymorphic (HCC) 09/23/2019   Pelvic pain affecting pregnancy 10/15/2015   Supervision of normal pregnancy in third trimester 09/28/2015   Poor weight gain of pregnancy 09/28/2015   Iron  deficiency anemia of pregnancy 09/01/2015   Increased BMI (body mass index) 07/29/2015    ONSET DATE: August 2021  REFERRING DIAG:  G93.1 (ICD-10-CM) - Anoxic brain injury (HCC)  R48.2 (ICD-10-CM) - Apraxia    THERAPY DIAG:   Muscle weakness (generalized)  Difficulty in walking, not elsewhere classified  Abnormality of gait and mobility  Unsteadiness on feet  Rationale for  Evaluation and Treatment: Rehabilitation  SUBJECTIVE:                                                                                                                                                                                             SUBJECTIVE STATEMENT:   Patient arrived late.  Pt accompanied by: self, father   PERTINENT HISTORY:   Patient is a 39 year old with diagnois of anoxic brain injury. Pt known to PT clinic. Previously seen for outpatient services March 2024. Pt was receiving HH PT until  October 2024 per her mother's report. [name] reports pt using HHA for ambulation, was using RW but she says was told not to do to difficulty with safe technique. Pt has fallen 1x in the past six months trying to ambulate on her own. Pt requires assistance with ADLs, including bathing and dressing. [name] reports pt never indicates any pain. Pt with severe aphasia so is unable to answer questions/pt's mom provides hx. She reports pt has impaired short-term memory, difficulty with stair climbing (has stair lift at home). She reports pt's BLE are weaker than they used to be. The pt is probably limited to <10 min with ambulation due to poor endurance. Pt reports her goal is to be able to use an AD to achieve mod-I mobility rather than relying on someone's HHA to ambulate.She was receiving outpatient PT until Feb. 2022 last year but ran out of visits. Patient has past medical history of Anoxic Brain Injury 09/2019, CAD, HLD, HTN, ISCHEMIC CARDIMYOPATHY, STEMI, V-FIB, ICD on 11/29/2020. Patient is currently living with mom. Mother reports she is abel to walk some with a walker with supervision but primarily ambulates with HHA of mother who is her guardian. Mother also reports she requires assist for all transfers and ADL's.   PAIN:  Are you having pain? No  PRECAUTIONS: Fall  WEIGHT BEARING RESTRICTIONS: No  FALLS: Has patient fallen in last 6 months? Yes. Number of falls 1  LIVING ENVIRONMENT: Lives with: lives with their family, mother Lives in: House/apartment Stairs: has stairs but stair lift installed, pt using lift Has following equipment at home: Environmental Consultant - 2 wheeled, Wheelchair (manual), shower chair, and Grab bars, pt uses stair lift  PLOF: Needs assistance with ADLs  PATIENT GOALS: balance, walking, and pt states basketball  OBJECTIVE:  Note: Objective measures were completed at Evaluation unless otherwise noted.  DIAGNOSTIC FINDINGS:  None recent in chart  COGNITION: Overall  cognitive status: impaired   SENSATION: Unable to complete testing due to impaired cognition, pt unable to report if sensation impairment present, pt mother unsure  EDEMA:  Mom reports pt's feet do swell from time to time   LOWER EXTREMITY MMT:  *accuracy of testing likely impacted by pt difficulty following cues  MMT Right Eval Left Eval  Hip flexion 4+ 4+  Hip extension    Hip abduction 4+ 4+  Hip adduction 4+ 4+  Hip internal rotation    Hip external rotation    Knee flexion 4+ 4+  Knee extension 5 5  Ankle dorsiflexion    Ankle plantarflexion    Ankle inversion    Ankle eversion    (Blank rows = not tested)  TRANSFERS: HHA  tried in session, very unsteady, pt has difficulty with motor planning, little success with cues due to distraction, very unsafe Attempted in session with RW, pt unsteady, gait ataxic, continued significant difficulty with motor planning requiring up to min a + 2 and multi-modal cues to safely sit in chair.   FUNCTIONAL TESTS:  5 times sit to stand: 1 min 43 sec Timed up and go (TUG): 1 min 30 sec with RW 10 meter walk test: 0.25 m/s with RW extensive cuing to complete activity  PATIENT SURVEYS:  SIS-16 filled out by pt's mother, used as questions relevant to pt: 45                                                                                                                              TREATMENT DATE: 02/25/2024   *Unless otherwise stated, CGA/min A was provided and gait belt donned in order to ensure pt safety throughout session.*  *Pt arrived to therapy session in transport chair with family member accompanying her in waiting area.* Transfer from transport chair into green arm rest chair, mod assist  for transition Walk with BRWW 10 ft, turn and sit in chair with max cueing for safe turning and seated position 4x Overground ambulation, 1 lap, 180ft using 2WW w/ BTB tied posterior to pt LE for tactile feedback for close body placement within  walker during ambulation  Backwards ambulation max A for walker x 10 ft Sit<>stand w/ hand held assist from PT, 2x5, verbal and tactile cue for placement of feet, and upright posture  GTB march 15x each LE.  Transport from green arm rest chair to transport chair using sit<>stand and pivot with 2WW with mod assist from PT and verbal cues for proper pivot technique and transition    PATIENT EDUCATION: Education details: goal reassessment, indications, exercise technique Person educated: Patient and Parent Education method: Explanation, Demonstration, Tactile cues, and Verbal cues Education comprehension: returned demonstration, verbal cues required, tactile cues required, and needs further education   HOME EXERCISE PROGRAM: Access Code: [email or web address] URL: [email or web address] Date: 06/11/2023 Prepared by: Massie Dollar  Exercises - Standing March with Counter Support  - 1 x daily - 7 x weekly - 2 sets - 8 reps - Side Stepping with Counter Support  - 1 x daily - 7 x weekly - 2 sets -  4 reps - Sit to Stand with Armchair  - 1 x daily - 7 x weekly - 2 sets - 5 reps - Seated March  - 1 x daily - 7 x weekly - 3 sets - 10 reps  GOALS: Goals reviewed with patient? Yes   SHORT TERM GOALS: Target date: 07/11/2023  Patient will be independent in home exercise program to improve strength/mobility for better functional independence with ADLs. Baseline: Goal status: INITIAL   LONG TERM GOALS: Target date:05/07/2024    1.  Patient will complete five times sit to stand test in < 35 seconds indicating an increased LE strength and improved balance. Baseline: previously 37 sec with UUE 04/19/22 today 05/30/23: 1 min 43 seconds; 6/3: 41 sec with improved technique (use of BUE on armrests);  08/21/2023= 38 sec with some distractibility and VC for hand placement 7:23: 39.06 (41.23, 36.89 sec) 10/29/23: 56.85 with HHA; 1 min 16 sec with B UE on armrests 11/14/2023= 32.68 sec with BUE Support  (VC for consistency of hand position)  12/05/23: 1 min 40 sec with R UE support (VC for proper technique) 12/17: 41 seconds with UE support on chair Goal status: IN PROGRESS  2. Patient will demo ability to ambulate > 400 ft w/ LRAD versus hand held assist min assist to demonstrate improved household mobility and short community distances.  Baseline: 4/28: 167ft with RW. Mod assist for AD management to prevent veer to the R. 6/3: ~ 300 ft with RW, requires cuing throughout for proximity to RW particularly with turns, but this has improved compared to eval;  08/21/2023= Patient ambulated approx 300 feet with RW- Sill some min assist at times to navigate walker and VC to remain close to walker.  7/23: 32ft with RW and min assist for safety and moderate instruction for safety AD intermittently, especially with fatigue  10/29/23:  Pt able to ambulate 500' with RW, however required modA at times to keep the walker stable 11/14/2023= Will assess next visit 12/05/23: Pt able to ambulate ~300' with RW and modA from therapist to keep the walker close/stable 12/17: 310 ft with RW with mod A for walker positioning  Goal status: IN PROGRESS  3.  Patient will increase 10 meter walk test to >1.48m/s as to improve gait speed for better community ambulation and to reduce fall risk. Baseline: previously 0.36 m/s on 04/19/22 : 0.25 m/s;  07/10/23: 0. 24 m/s with RW;  08/15/2023= 0.39 m/s  with RW yet max VC for RW navigation.  7/23:Average Normal speed with RW: 0.25 m/s Average Normal speed with HHA: 0.59m/s 10/29/23: Average Normal speed with RW: 0.23 m/s 11/14/2023= 0.28 m/s with RW (VC for navigation); 0.45 m/s with HHA 12/05/23: 0.18 m/s; 53.95 sec with RW  12/17: 38 seconds with RW  Goal status: PROGRESSING  4.  Patient will reduce timed up and go (TUG) to <30 seconds to reduce fall risk and demonstrate improved transfer/gait ability Baseline: previously 91 sec with RW on 04/19/22; 05/30/23: 1 min 30 sec with  RW 07/19/2023: 48.265 seconds using RW with skilled min A primarily for AD management with pt having difficulty turning with AD;  08/21/2023- 33 sec with HHA and 1 min 25 sec with RW (Max VC for safety with turning back to seat and to reach back safely)  7/23:42.18sec with RW and min assist for safety in turn; 40.35 sec with HHA and min assist for safety with turns.  10/29/23: TBD 11/14/2023= 43 sec with RW with difficulty turning  180 at end and max VC to reach back; 31 sec with HHA today. VC for turning.  12/17: 39 seconds with RW assistance for guiding walker for turn Goal status: MET/ progressed  5. Pt will exhibit correct and safe technique with stand>sit into a standard chair or transport chair to reduce risk of falling.   Baseline: pt current technique unsafe, poor motor planning; 6/3: pt with improved technique, bring RW closer to chair prior to sitting; 08/21/2023- Patient able to perform with CGA yet inconsistent with cues for safety and consistency with hand placement.  10/29/23: Pt still with inconsistent STS's, utilizing heavy UE assistance  11/14/2023= Patient continues to be inconsistent but performed around 10 times today and able to reach back and push up using UE appropriately 7/10 times.  Goal status: PARTIALLY MET   6. The pt will demonstrate at least a 15 point improvement on SIS-16 to indicate increased ease with ADLs. Baseline: 45 (filled out by pt's mother); 42; 08/21/2023= 46; 09/03/2023: 46/80 10/29/23: TBD 11/19/2023- Mother not present so will assess next visit she is present. 12/17: unable to give today due to mom not present 12/22:  37 Goal status: ONGOING   ASSESSMENT:  CLINICAL IMPRESSION:   The patient arrives to the scheduled therapy session later than the originally planned start time, and as a direct result of this delayed arrival, the overall treatment session is shortened and more abbreviated than what would typically occur during a full-length appointment. Due  to the reduced available time, the session is necessarily condensed, though skilled interventions are still provided within the limited timeframe. Pt will continue to benefit from skilled therapy to address remaining deficits in order to improve overall QoL and return to PLOF.       OBJECTIVE IMPAIRMENTS: Abnormal gait, decreased activity tolerance, decreased balance, decreased coordination, decreased endurance, decreased knowledge of use of DME, decreased mobility, difficulty walking, decreased strength, decreased safety awareness, increased edema, impaired tone, improper body mechanics, and postural dysfunction.   ACTIVITY LIMITATIONS: carrying, lifting, bending, standing, squatting, stairs, transfers, bathing, dressing, locomotion level, and caring for others  PARTICIPATION LIMITATIONS: meal prep, cleaning, laundry, medication management, personal finances, driving, shopping, community activity, occupation, and yard work  PERSONAL FACTORS: Sex, Time since onset of injury/illness/exacerbation, and 1-2 comorbidities: per chart PMH includes CAD, HTN, ischemic cardiomyopathy, STEMI, ventricular fibrillation, dysphagia, s/p ICD, apraxia  are also affecting patient's functional outcome.   REHAB POTENTIAL: Fair    CLINICAL DECISION MAKING: Evolving/moderate complexity  EVALUATION COMPLEXITY: High  PLAN:  PT FREQUENCY: 1-2x/week  PT DURATION: 12 weeks  PLANNED INTERVENTIONS: 97164- PT Re-evaluation, 97750- Physical Performance Testing, 97110-Therapeutic exercises, 97530- Therapeutic activity, 97112- Neuromuscular re-education, 97535- Self Care, [id number]- Manual therapy, [phone]- Gait training, [phone]- Orthotic Initial, [phone]- Orthotic/Prosthetic subsequent, [phone]- Canalith repositioning, Patient/Family education, Balance training, Stair training, Taping, Joint mobilization, Spinal mobilization, Vestibular training, DME instructions, Wheelchair mobility training, Cryotherapy, and Moist heat   PLAN  FOR NEXT SESSION:     backwards gait training using RW with skilled assistance - perform in environment with limited distractions to allow improved motor learning Transfer training- learning sequence of AD management and stepping back to seat Gait training using RW in home-like environment Dynamic balance Reaching with use of Blaze Pods for tapping hands  Paizlee Kinder  Leopoldo, PT, DPT Physical Therapist - [facility]  Outpatient Physical Therapy- Main Campus [phone]    02/25/24, 2:47 PM  "

## 2024-02-26 NOTE — Therapy (Signed)
 " OUTPATIENT PHYSICAL THERAPY TREATMENT     Patient Name: Beverly [name] MRN: [id number] DOB:11-05-1985, 39 y.o., female Today's Date: 02/27/2024  PCP: [facility] REFERRING PROVIDER: Babs Arthea DASEN, MD  END OF SESSION:   PT End of Session - 02/27/24 1356     Visit Number 54    Number of Visits 76    Date for Recertification  05/07/24    Authorization Type UHC Dual Complete; Medicaid secondary    Progress Note Due on Visit 40    PT Start Time 1400    PT Stop Time 1443    PT Time Calculation (min) 43 min    Equipment Utilized During Treatment Gait belt    Activity Tolerance Patient tolerated treatment well    Behavior During Therapy Flat affect;Impulsive;WFL for tasks assessed/performed                Past Medical History:  Diagnosis Date   Brain injury [facility])    CAD (coronary artery disease)    HLD (hyperlipidemia)    Hypertension    last pregnancy   Ischemic cardiomyopathy    STEMI (ST elevation myocardial infarction) (HCC) 09/2019   SCAD with aneurysmal dilation of proximal LAD.   Vaginal Pap smear, abnormal    when she was 39yo   Ventricular fibrillation (HCC) 09/23/2019   Past Surgical History:  Procedure Laterality Date   CARDIAC CATHETERIZATION     IR GASTROSTOMY TUBE MOD SED  10/07/2019   IR GASTROSTOMY TUBE REMOVAL  07/02/2020   IR REPLACE G-TUBE SIMPLE WO FLUORO  09/28/2023   IR REPLC GASTRO/COLONIC TUBE PERCUT W/FLUORO  11/03/2021   IR REPLC GASTRO/COLONIC TUBE PERCUT W/FLUORO  03/09/2023   LEFT HEART CATH AND CORONARY ANGIOGRAPHY N/A 09/23/2019   Procedure: LEFT HEART CATH AND CORONARY ANGIOGRAPHY;  Surgeon: Mady [name], MD;  Location: [facility] INVASIVE CV LAB;  Service: Cardiovascular;  Laterality: N/A;   SUBQ ICD IMPLANT N/A 11/26/2020   Procedure: SUBQ ICD IMPLANT;  Surgeon: [name], MD;  Location: [facility] INVASIVE CV LAB;  Service: Cardiovascular;  Laterality: N/A;   Patient Active Problem List   Diagnosis  Date Noted   Nocturnal enuresis 01/01/2024   Hyperlipidemia 12/01/2023   Apraxia 10/05/2021   S/P ICD (internal cardiac defibrillator) procedure 11/27/2020   Hx of Cardiac arrest (HCC) 11/26/2020   Abnormality of gait 03/16/2020   Sleep disturbance 03/16/2020   Oropharyngeal dysphagia 02/12/2020   Coronary artery disease involving native coronary artery of native heart without angina pectoris 12/12/2019   Ischemic cardiomyopathy 12/12/2019   Brain injury (HCC)    Prediabetes    Anoxic brain injury (HCC) 11/05/2019   Dysphagia 11/05/2019   Physical deconditioning 11/05/2019   Acute delirium 11/05/2019   Respiratory failure (HCC)    Encounter for central line placement    Ventricular fibrillation (HCC) 09/23/2019   Acute combined systolic and diastolic heart failure (HCC) 09/23/2019   Ventricular tachycardia, polymorphic (HCC) 09/23/2019   Pelvic pain affecting pregnancy 10/15/2015   Supervision of normal pregnancy in third trimester 09/28/2015   Poor weight gain of pregnancy 09/28/2015   Iron  deficiency anemia of pregnancy 09/01/2015   Increased BMI (body mass index) 07/29/2015    ONSET DATE: August 2021  REFERRING DIAG:  G93.1 (ICD-10-CM) - Anoxic brain injury (HCC)  R48.2 (ICD-10-CM) - Apraxia    THERAPY DIAG:   Muscle weakness (generalized)  Difficulty in walking, not elsewhere classified  Abnormality of gait and mobility  Unsteadiness on feet  Rationale  for Evaluation and Treatment: Rehabilitation  SUBJECTIVE:                                                                                                                                                                                             SUBJECTIVE STATEMENT:  Patient presents with dad. No new changes.  Pt accompanied by: self, father   PERTINENT HISTORY:   Patient is a 39 year old with diagnois of anoxic brain injury. Pt known to PT clinic. Previously seen for outpatient services March 2024. Pt was  receiving HH PT until October 2024 per her mother's report. [name] reports pt using HHA for ambulation, was using RW but she says was told not to do to difficulty with safe technique. Pt has fallen 1x in the past six months trying to ambulate on her own. Pt requires assistance with ADLs, including bathing and dressing. [name] reports pt never indicates any pain. Pt with severe aphasia so is unable to answer questions/pt's mom provides hx. She reports pt has impaired short-term memory, difficulty with stair climbing (has stair lift at home). She reports pt's BLE are weaker than they used to be. The pt is probably limited to <10 min with ambulation due to poor endurance. Pt reports her goal is to be able to use an AD to achieve mod-I mobility rather than relying on someone's HHA to ambulate.She was receiving outpatient PT until Feb. 2022 last year but ran out of visits. Patient has past medical history of Anoxic Brain Injury 09/2019, CAD, HLD, HTN, ISCHEMIC CARDIMYOPATHY, STEMI, V-FIB, ICD on 11/29/2020. Patient is currently living with mom. Mother reports she is abel to walk some with a walker with supervision but primarily ambulates with HHA of mother who is her guardian. Mother also reports she requires assist for all transfers and ADL's.   PAIN:  Are you having pain? No  PRECAUTIONS: Fall  WEIGHT BEARING RESTRICTIONS: No  FALLS: Has patient fallen in last 6 months? Yes. Number of falls 1  LIVING ENVIRONMENT: Lives with: lives with their family, mother Lives in: House/apartment Stairs: has stairs but stair lift installed, pt using lift Has following equipment at home: Environmental Consultant - 2 wheeled, Wheelchair (manual), shower chair, and Grab bars, pt uses stair lift  PLOF: Needs assistance with ADLs  PATIENT GOALS: balance, walking, and pt states basketball  OBJECTIVE:  Note: Objective measures were completed at Evaluation unless otherwise noted.  DIAGNOSTIC FINDINGS:  None recent in  chart  COGNITION: Overall cognitive status: impaired   SENSATION: Unable to complete testing due to impaired cognition, pt unable to report if sensation impairment present, pt  mother unsure   EDEMA:  Mom reports pt's feet do swell from time to time   LOWER EXTREMITY MMT:  *accuracy of testing likely impacted by pt difficulty following cues  MMT Right Eval Left Eval  Hip flexion 4+ 4+  Hip extension    Hip abduction 4+ 4+  Hip adduction 4+ 4+  Hip internal rotation    Hip external rotation    Knee flexion 4+ 4+  Knee extension 5 5  Ankle dorsiflexion    Ankle plantarflexion    Ankle inversion    Ankle eversion    (Blank rows = not tested)  TRANSFERS: HHA  tried in session, very unsteady, pt has difficulty with motor planning, little success with cues due to distraction, very unsafe Attempted in session with RW, pt unsteady, gait ataxic, continued significant difficulty with motor planning requiring up to min a + 2 and multi-modal cues to safely sit in chair.   FUNCTIONAL TESTS:  5 times sit to stand: 1 min 43 sec Timed up and go (TUG): 1 min 30 sec with RW 10 meter walk test: 0.25 m/s with RW extensive cuing to complete activity  PATIENT SURVEYS:  SIS-16 filled out by pt's mother, used as questions relevant to pt: 45                                                                                                                              TREATMENT DATE: 02/27/2024   *Unless otherwise stated, CGA/min A was provided and gait belt donned in order to ensure pt safety throughout session.*  *Pt arrived to therapy session in transport chair with family member accompanying her in waiting area.* Transfer from transport chair into green arm rest chair, mod assist  for transition Walk with BRWW 20 ft, turn and sit in chair with max cueing for safe turning and seated position 4x Overground ambulation, 1 lap, 119ft using 2WW w/ BTB tied posterior to pt LE for tactile feedback  for close body placement within walker during ambulation with 2 lb AW Sit<>stand w/ hand held assist from PT, 2x5, verbal and tactile cue for placement of feet, and upright posture  GTB march 15x each LE.  Activity Description: pods on a table stand with RW and reach Activity Setting:  The Blaze Pod Random setting was chosen to enhance cognitive processing and agility, providing an unpredictable environment to simulate real-world scenarios, and fostering quick reactions and adaptability.  Number of Pods:  6 Cycles/Sets:  2 Duration (Time or Hit Count):  20  Donning/doffing coat with max A  Transport from green arm rest chair to transport chair using sit<>stand and pivot with 2WW with mod assist from PT and verbal cues for proper pivot technique and transition    PATIENT EDUCATION: Education details: goal reassessment, indications, exercise technique Person educated: Patient and Parent Education method: Explanation, Demonstration, Tactile cues, and Verbal cues Education comprehension: returned demonstration, verbal cues required, tactile cues required,  and needs further education   HOME EXERCISE PROGRAM: Access Code: [email or web address] URL: [email or web address] Date: 06/11/2023 Prepared by: Massie Dollar  Exercises - Standing March with Counter Support  - 1 x daily - 7 x weekly - 2 sets - 8 reps - Side Stepping with Counter Support  - 1 x daily - 7 x weekly - 2 sets - 4 reps - Sit to Stand with Armchair  - 1 x daily - 7 x weekly - 2 sets - 5 reps - Seated March  - 1 x daily - 7 x weekly - 3 sets - 10 reps  GOALS: Goals reviewed with patient? Yes   SHORT TERM GOALS: Target date: 07/11/2023  Patient will be independent in home exercise program to improve strength/mobility for better functional independence with ADLs. Baseline: Goal status: INITIAL   LONG TERM GOALS: Target date:05/07/2024    1.  Patient will complete five times sit to stand test in < 35 seconds  indicating an increased LE strength and improved balance. Baseline: previously 37 sec with UUE 04/19/22 today 05/30/23: 1 min 43 seconds; 6/3: 41 sec with improved technique (use of BUE on armrests);  08/21/2023= 38 sec with some distractibility and VC for hand placement 7:23: 39.06 (41.23, 36.89 sec) 10/29/23: 56.85 with HHA; 1 min 16 sec with B UE on armrests 11/14/2023= 32.68 sec with BUE Support (VC for consistency of hand position)  12/05/23: 1 min 40 sec with R UE support (VC for proper technique) 12/17: 41 seconds with UE support on chair Goal status: IN PROGRESS  2. Patient will demo ability to ambulate > 400 ft w/ LRAD versus hand held assist min assist to demonstrate improved household mobility and short community distances.  Baseline: 4/28: 157ft with RW. Mod assist for AD management to prevent veer to the R. 6/3: ~ 300 ft with RW, requires cuing throughout for proximity to RW particularly with turns, but this has improved compared to eval;  08/21/2023= Patient ambulated approx 300 feet with RW- Sill some min assist at times to navigate walker and VC to remain close to walker.  7/23: 320ft with RW and min assist for safety and moderate instruction for safety AD intermittently, especially with fatigue  10/29/23:  Pt able to ambulate 500' with RW, however required modA at times to keep the walker stable 11/14/2023= Will assess next visit 12/05/23: Pt able to ambulate ~300' with RW and modA from therapist to keep the walker close/stable 12/17: 310 ft with RW with mod A for walker positioning  Goal status: IN PROGRESS  3.  Patient will increase 10 meter walk test to >1.56m/s as to improve gait speed for better community ambulation and to reduce fall risk. Baseline: previously 0.36 m/s on 04/19/22 : 0.25 m/s;  07/10/23: 0. 24 m/s with RW;  08/15/2023= 0.39 m/s  with RW yet max VC for RW navigation.  7/23:Average Normal speed with RW: 0.25 m/s Average Normal speed with HHA: 0.33m/s 10/29/23: Average  Normal speed with RW: 0.23 m/s 11/14/2023= 0.28 m/s with RW (VC for navigation); 0.45 m/s with HHA 12/05/23: 0.18 m/s; 53.95 sec with RW  12/17: 38 seconds with RW  Goal status: PROGRESSING  4.  Patient will reduce timed up and go (TUG) to <30 seconds to reduce fall risk and demonstrate improved transfer/gait ability Baseline: previously 91 sec with RW on 04/19/22; 05/30/23: 1 min 30 sec with RW 07/19/2023: 48.265 seconds using RW with skilled min A primarily for AD management with pt having difficulty turning  with AD;  08/21/2023- 33 sec with HHA and 1 min 25 sec with RW (Max VC for safety with turning back to seat and to reach back safely)  7/23:42.18sec with RW and min assist for safety in turn; 40.35 sec with HHA and min assist for safety with turns.  10/29/23: TBD 11/14/2023= 43 sec with RW with difficulty turning 180 at end and max VC to reach back; 31 sec with HHA today. VC for turning.  12/17: 39 seconds with RW assistance for guiding walker for turn Goal status: MET/ progressed  5. Pt will exhibit correct and safe technique with stand>sit into a standard chair or transport chair to reduce risk of falling.   Baseline: pt current technique unsafe, poor motor planning; 6/3: pt with improved technique, bring RW closer to chair prior to sitting; 08/21/2023- Patient able to perform with CGA yet inconsistent with cues for safety and consistency with hand placement.  10/29/23: Pt still with inconsistent STS's, utilizing heavy UE assistance  11/14/2023= Patient continues to be inconsistent but performed around 10 times today and able to reach back and push up using UE appropriately 7/10 times.  Goal status: PARTIALLY MET   6. The pt will demonstrate at least a 15 point improvement on SIS-16 to indicate increased ease with ADLs. Baseline: 45 (filled out by pt's mother); 42; 08/21/2023= 46; 09/03/2023: 46/80 10/29/23: TBD 11/19/2023- Mother not present so will assess next visit she is present. 12/17:  unable to give today due to mom not present 12/22:  37 Goal status: ONGOING   ASSESSMENT:  CLINICAL IMPRESSION:  Patient is very challenged with sequential standing, turning, and sitting down requiring max cueing for hand placement and safety of placement of walker. Improves with repetition. Patient is highly motivated throughout session but does require max cueing for task orientation due to her being easily distracted. Standing balance with reaching is improving with decreased LOB. Pt will continue to benefit from skilled therapy to address remaining deficits in order to improve overall QoL and return to PLOF.       OBJECTIVE IMPAIRMENTS: Abnormal gait, decreased activity tolerance, decreased balance, decreased coordination, decreased endurance, decreased knowledge of use of DME, decreased mobility, difficulty walking, decreased strength, decreased safety awareness, increased edema, impaired tone, improper body mechanics, and postural dysfunction.   ACTIVITY LIMITATIONS: carrying, lifting, bending, standing, squatting, stairs, transfers, bathing, dressing, locomotion level, and caring for others  PARTICIPATION LIMITATIONS: meal prep, cleaning, laundry, medication management, personal finances, driving, shopping, community activity, occupation, and yard work  PERSONAL FACTORS: Sex, Time since onset of injury/illness/exacerbation, and 1-2 comorbidities: per chart PMH includes CAD, HTN, ischemic cardiomyopathy, STEMI, ventricular fibrillation, dysphagia, s/p ICD, apraxia  are also affecting patient's functional outcome.   REHAB POTENTIAL: Fair    CLINICAL DECISION MAKING: Evolving/moderate complexity  EVALUATION COMPLEXITY: High  PLAN:  PT FREQUENCY: 1-2x/week  PT DURATION: 12 weeks  PLANNED INTERVENTIONS: 97164- PT Re-evaluation, 97750- Physical Performance Testing, 97110-Therapeutic exercises, 97530- Therapeutic activity, 97112- Neuromuscular re-education, 97535- Self Care, [id number]-  Manual therapy, [phone]- Gait training, [phone]- Orthotic Initial, [phone]- Orthotic/Prosthetic subsequent, [phone]- Canalith repositioning, Patient/Family education, Balance training, Stair training, Taping, Joint mobilization, Spinal mobilization, Vestibular training, DME instructions, Wheelchair mobility training, Cryotherapy, and Moist heat   PLAN FOR NEXT SESSION:     backwards gait training using RW with skilled assistance - perform in environment with limited distractions to allow improved motor learning Transfer training- learning sequence of AD management and stepping back to seat Gait training using RW in home-like  environment Dynamic balance Reaching with use of Blaze Pods for tapping hands  Kareena Arrambide  Leopoldo, PT, DPT Physical Therapist - [facility]  Outpatient Physical Therapy- Main Campus [phone]    02/27/24, 2:43 PM  "

## 2024-02-27 ENCOUNTER — Other Ambulatory Visit: Payer: Self-pay

## 2024-02-27 ENCOUNTER — Ambulatory Visit: Admitting: Internal Medicine

## 2024-02-27 ENCOUNTER — Ambulatory Visit

## 2024-02-27 ENCOUNTER — Encounter

## 2024-02-27 ENCOUNTER — Encounter: Payer: Self-pay | Admitting: Internal Medicine

## 2024-02-27 VITALS — BP 104/71 | HR 80 | Ht 69.0 in | Wt 204.8 lb

## 2024-02-27 DIAGNOSIS — I2510 Atherosclerotic heart disease of native coronary artery without angina pectoris: Secondary | ICD-10-CM

## 2024-02-27 DIAGNOSIS — I5020 Unspecified systolic (congestive) heart failure: Secondary | ICD-10-CM

## 2024-02-27 DIAGNOSIS — R2681 Unsteadiness on feet: Secondary | ICD-10-CM

## 2024-02-27 DIAGNOSIS — I5042 Chronic combined systolic (congestive) and diastolic (congestive) heart failure: Secondary | ICD-10-CM

## 2024-02-27 DIAGNOSIS — I255 Ischemic cardiomyopathy: Secondary | ICD-10-CM | POA: Diagnosis not present

## 2024-02-27 DIAGNOSIS — R262 Difficulty in walking, not elsewhere classified: Secondary | ICD-10-CM

## 2024-02-27 DIAGNOSIS — Z8679 Personal history of other diseases of the circulatory system: Secondary | ICD-10-CM | POA: Diagnosis not present

## 2024-02-27 DIAGNOSIS — G931 Anoxic brain damage, not elsewhere classified: Secondary | ICD-10-CM

## 2024-02-27 DIAGNOSIS — I4901 Ventricular fibrillation: Secondary | ICD-10-CM

## 2024-02-27 DIAGNOSIS — R269 Unspecified abnormalities of gait and mobility: Secondary | ICD-10-CM

## 2024-02-27 DIAGNOSIS — M6281 Muscle weakness (generalized): Secondary | ICD-10-CM | POA: Diagnosis not present

## 2024-02-27 NOTE — Patient Instructions (Signed)
 Medication Instructions:  No medication changes - continue your current medications.  *If you need a refill on your cardiac medications before your next appointment, please call your pharmacy*  Lab Work: None  Testing/Procedures: None  Follow-Up: At [facility], you and your health needs are our priority.  As part of our continuing mission to provide you with exceptional heart care, our providers are all part of one team.  This team includes your primary Cardiologist (physician) and Advanced Practice Providers or APPs (Physician Assistants and Nurse Practitioners) who all work together to provide you with the care you need, when you need it.  Your next appointment:   6 month(s)  Provider:   You may see Lonni Hanson, MD or one of the following Advanced Practice Providers on your designated Care Team:   Lonni Meager, NP Lesley Maffucci, PA-C Bernardino Bring, PA-C Cadence Selinsgrove, PA-C Tylene Lunch, NP Barnie Hila, NP    We recommend signing up for the patient portal called MyChart.  Sign up information is provided on this After Visit Summary.  MyChart is used to connect with patients for Virtual Visits (Telemedicine).  Patients are able to view lab/test results, encounter notes, upcoming appointments, etc.  Non-urgent messages can be sent to your provider as well.   To learn more about what you can do with MyChart, go to [email or web address].   Other Instructions

## 2024-02-27 NOTE — Telephone Encounter (Signed)
 Called Patient and her Mother to find out if they received the G tube supplementation and supplies.

## 2024-02-27 NOTE — Patient Instructions (Signed)
 Visit Information  Thank you for taking time to visit with me today. Please don't hesitate to contact me if I can be of assistance to you before our next scheduled appointment.  Your next care management appointment is no further scheduled appointments.  Will continue to see LCSW.   Closing From: RN Care Management  Please call the care guide team at [phone] if you need to cancel, schedule, or reschedule an appointment.   Please call the Suicide and Crisis Lifeline: 988 call the [place]  National Suicide Prevention Lifeline: 506-246-5117 or TTY: (915)221-1437 TTY 7092312957) to talk to a trained counselor call 1-800-273-TALK (toll free, 24 hour hotline) go to [facility] [address] [place] [phone]) call 911 if you are experiencing a Mental Health or Behavioral Health Crisis or need someone to talk to.  Nestora Duos, MSN, RN [place]  [facility], [facility] RN Care Manager Direct Dial: [phone] Fax: [phone]   Fall Prevention in the Home, Adult Falls can cause injuries and can happen to people of all ages. There are many things you can do to make your home safer and to help prevent falls. What actions can I take to prevent falls? General information Use good lighting in all rooms. Make sure to: Replace any light bulbs that burn out. Turn on the lights in dark areas and use night-lights. Keep items that you use often in easy-to-reach places. Lower the shelves around your home if needed. Move furniture so that there are clear paths around it. Do not use throw rugs or other things on the floor that can make you trip. If any of your floors are uneven, fix them. Add color or contrast paint or tape to clearly mark and help you see: Grab bars or handrails. First and last steps of staircases. Where the edge of each step is. If you use a ladder or stepladder: Make sure that it is fully opened. Do  not climb a closed ladder. Make sure the sides of the ladder are locked in place. Have someone hold the ladder while you use it. Know where your pets are as you move through your home. What can I do in the bathroom?     Keep the floor dry. Clean up any water  on the floor right away. Remove soap buildup in the bathtub or shower. Buildup makes bathtubs and showers slippery. Use non-skid mats or decals on the floor of the bathtub or shower. Attach bath mats securely with double-sided, non-slip rug tape. If you need to sit down in the shower, use a non-slip stool. Install grab bars by the toilet and in the bathtub and shower. Do not use towel bars as grab bars. What can I do in the bedroom? Make sure that you have a light by your bed that is easy to reach. Do not use any sheets or blankets on your bed that hang to the floor. Have a firm chair or bench with side arms that you can use for support when you get dressed. What can I do in the kitchen? Clean up any spills right away. If you need to reach something above you, use a step stool with a grab bar. Keep electrical cords out of the way. Do not use floor polish or wax that makes floors slippery. What can I do with my stairs? Do not leave anything on the stairs. Make sure that you have a light switch at the top and the bottom of the stairs. Make  sure that there are handrails on both sides of the stairs. Fix handrails that are broken or loose. Install non-slip stair treads on all your stairs if they do not have carpet. Avoid having throw rugs at the top or bottom of the stairs. Choose a carpet that does not hide the edge of the steps on the stairs. Make sure that the carpet is firmly attached to the stairs. Fix carpet that is loose or worn. What can I do on the outside of my home? Use bright outdoor lighting. Fix the edges of walkways and driveways and fix any cracks. Clear paths of anything that can make you trip, such as tools or  rocks. Add color or contrast paint or tape to clearly mark and help you see anything that might make you trip as you walk through a door, such as a raised step or threshold. Trim any bushes or trees on paths to your home. Check to see if handrails are loose or broken and that both sides of all steps have handrails. Install guardrails along the edges of any raised decks and porches. Have leaves, snow, or ice cleared regularly. Use sand, salt, or ice melter on paths if you live where there is ice and snow during the winter. Clean up any spills in your garage right away. This includes grease or oil spills. What other actions can I take? Review your medicines with your doctor. Some medicines can cause dizziness or changes in blood pressure, which increase your risk of falling. Wear shoes that: Have a low heel. Do not wear high heels. Have rubber bottoms and are closed at the toe. Feel good on your feet and fit well. Use tools that help you move around if needed. These include: Canes. Walkers. Scooters. Crutches. Ask your doctor what else you can do to help prevent falls. This may include seeing a physical therapist to learn to do exercises to move better and get stronger. Where to find more information Centers for Disease Control and Prevention, STEADI: tonerpromos.no [employer] on Aging: baseringtones.pl National Institute on Aging: baseringtones.pl Contact a doctor if: You are afraid of falling at home. You feel weak, drowsy, or dizzy at home. You fall at home. Get help right away if you: Lose consciousness or have trouble moving after a fall. Have a fall that causes a head injury. These symptoms may be an emergency. Get help right away. Call 911. Do not wait to see if the symptoms will go away. Do not drive yourself to the hospital. This information is not intended to replace advice given to you by your health care provider. Make sure you discuss any questions you have with your health care  provider. Document Revised: 09/26/2021 Document Reviewed: 09/26/2021 Elsevier Patient Education  2024 Elsevier Inc.  Heart Failure Action Plan A heart failure action plan helps you know what to do when you have symptoms of heart failure. Your action plan is a color-coded plan that lists the symptoms to watch for and indicates what actions to take. If you have symptoms in the green zone, you're doing well. If you have symptoms in the yellow zone, you're having problems. If you have symptoms in the red zone, you need medical care right away. Follow the plan that was created by you and your health care provider. Review your plan each time you visit your provider. Green zone These signs mean you're doing well and can continue what you're doing: You don't have new or worsening shortness of breath. You  have very little swelling or no new swelling. Your weight is stable (no gain or loss). You have a normal activity level. You don't have chest pain or any other new symptoms. Yellow zone These signs and symptoms mean your condition may be getting worse and you should make some changes: You have trouble breathing when you're active. You have swelling in your feet or legs or have discomfort in your belly. You gain 2-3 lb (0.9-1.4 kg) in 24 hours, or 5 lb (2.3 kg) in a week. This amount may be more or less depending on your condition. You get tired easily. You have trouble sleeping. You have a dry cough. If you have any of these symptoms: Contact your provider within the next day. Your provider may adjust your medicines. Red zone These signs and symptoms mean you should get medical help right away: You have trouble breathing when resting or cannot lie flat and you need to raise your head to help you breathe. You have a dry cough that's getting worse. You have swelling or pain in your feet or legs or discomfort in your belly that's getting worse. You suddenly gain more than 2-3 lb (0.9-1.4 kg)  in 24 hours, or more than 5 lb (2.3 kg) in a week. This amount may be more or less depending on your condition. You have trouble staying awake or you feel confused. You don't have an appetite. You have worsening sadness or depression. These symptoms may be an emergency. Call 911 right away. Do not wait to see if the symptoms will go away. Do not drive yourself to the hospital. Follow these instructions at home: Take medicines only as told. Eat a heart-healthy diet. Work with a dietitian to create an eating plan that's best for you. Weigh yourself each day. Your target weight is __________ lb (__________ kg). Call your provider if you gain more than __________ lb (__________ kg) in 24 hours, or more than __________ lb (__________ kg) in a week. Health care provider name: _____________________________________________________ Health care provider phone number: _____________________________________________________ Where to find more information American Heart Association: heart.org This information is not intended to replace advice given to you by your health care provider. Make sure you discuss any questions you have with your health care provider. Document Revised: 09/07/2022 Document Reviewed: 09/07/2022 Elsevier Patient Education  2024 [employer].

## 2024-02-27 NOTE — Progress Notes (Unsigned)
" °  Cardiology Office Note:  .   Date:  02/28/2024  ID:  Beverly [name], DOB Oct 09, 1985, MRN [id number] PCP: Vincente Saber, NP  [facility] Providers Cardiologist:  Lonni Hanson, MD Electrophysiologist:  OLE ONEIDA HOLTS, MD (Inactive)     History of Present Illness: .   Beverly [name] is a 39 y.o. 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  She was last seen in our office in 05/2023, at which time her mother noted some edema in Beverly [name]'s feet.  She was more verbal since coming of Adderall.  No medication changes are additional testing were pursued.  Beverly [name] and her family report that Beverly [name] has been doing fairly well.  She has chronic dependent edema, which is stable.  She has not expressed and chest pain or shortness of breath.  She has not passed out or had any ICD shocks.  Her family feels that she is more interactive since coming off Ritalin  and Adderrall.  ROS: See HPI  Studies Reviewed: SABRA   EKG Interpretation Date/Time:  Wednesday February 27 2024 16:10:08 EST Ventricular Rate:  80 PR Interval:  158 QRS Duration:  80 QT Interval:  368 QTC Calculation: 424 R Axis:   66  Text Interpretation: Normal sinus rhythm Low voltage QRS When compared with ECG of 05-Jun-2023 15:37, QRS axis Shifted left Otherwise no significant change Confirmed by Astryd Pearcy, Lonni [phone]) on 02/28/2024 5:35:45 PM     Risk Assessment/Calculations:             Physical Exam:   VS:  BP 104/71 (BP Location: Right Wrist, Patient Position: Sitting, Cuff Size: Normal)   Pulse 80 Comment: 80 oximeter  Ht 5' 9 (1.753 m)   Wt 204 lb 12.8 oz (92.9 kg)   SpO2 98%   BMI 30.24 kg/m    Wt Readings from Last 3 Encounters:  02/27/24 204 lb 12.8 oz (92.9  kg)  01/23/24 200 lb (90.7 kg)  11/15/23 200 lb (90.7 kg)    General:  NAD. Neck: No JVD or HJR. Lungs: Clear to auscultation bilaterally without wheezes or crackles. Heart: Regular rate and rhythm without murmurs, rubs, or gallops. Abdomen: Soft, nontender, nondistended.  G-tube in place. Extremities: Trace pretibial edema bilaterally.  ASSESSMENT AND PLAN: .    Coronary artery disease: No complaints reported today by Ms. Wolfer or her family.  Continue secondary prevention with atorvastatin  and aspirin .  Lipids very well-controlled on last check in 11/2023 (LDL 46).  Heart failure with recovered ejection fraction due to ischemic cardiomyopathy: Mild dependent edema is stable.  Continue low dose carvedilol ; unable to escalate GDMT further due to chronic soft BP.  Defer standing diuretic as well.  LVEF 50-55% on last check in 2021.  History of ventricular fibrillation s/p subcutaneous ICD: No syncope or ICD shocks.  Continue low-dose carvedilol  as well as device follow-up in the EP clinic.  Anoxic brain injury: Continue therapy and ongoing physiatry follow-up.    Dispo: Return to clinic in 6 months.  Signed, Lonni Hanson, MD   "

## 2024-02-27 NOTE — Patient Outreach (Signed)
 Complex Care Management   Visit Note  02/27/2024  Name:  Beverly [name] MRN: [id number] DOB: Jan 30, 1986  Situation: Referral received for Complex Care Management related to ESRD and G-Tube Feeding I obtained verbal consent from Guardian.  Visit completed with Guardian  on the phone  Background:   Past Medical History:  Diagnosis Date   Brain injury [facility])    CAD (coronary artery disease)    HLD (hyperlipidemia)    Hypertension    last pregnancy   Ischemic cardiomyopathy    STEMI (ST elevation myocardial infarction) (HCC) 09/2019   SCAD with aneurysmal dilation of proximal LAD.   Vaginal Pap smear, abnormal    when she was 39yo   Ventricular fibrillation (HCC) 09/23/2019    Assessment: Patient Reported Symptoms:  Cognitive Cognitive Status: Able to follow simple commands, Struggling with memory recall (oriented to self and home) Brain Injury: anoxic brain injury not oritneted to place or time, follows simple 1 step commands, verbal - short responses/phrases   Health Maintenance Behaviors: Exercise, Sleep adequate Healing Pattern: Average Health Facilitated by: Rest  Neurological Neurological Review of Symptoms: No symptoms reported Neurological Management Strategies: Routine screening Neurological Comment: patient does not have complaints of HA/dizzy and none noted, no weakness  HEENT HEENT Symptoms Reported: No symptoms reported HEENT Management Strategies: Routine screening HEENT Comment: glasses for distance    Cardiovascular Cardiovascular Symptoms Reported: No symptoms reported Does patient have uncontrolled Hypertension?: No Cardiovascular Management Strategies: Routine screening, Medication therapy Cardiovascular Comment: Mother reports swelling in feet at times, seeing cardiology today and will discuss, no sx of orthostatic hypotension per Mom, BP checks several times a week runs low 100's/60's  Respiratory Respiratory Symptoms Reported: No symptoms reported     Endocrine Endocrine Symptoms Reported: Not assessed Is patient diabetic?: No    Gastrointestinal Gastrointestinal Symptoms Reported: No symptoms reported, Incontinence Additional Gastrointestinal Details: bowel continence improving - toileting schedule Gastrointestinal Management Strategies: Incontinence garment/pad Gastrointestinal Comment: G-Tube 5-6 cans Ensure Enlive daily, no issues with insertion site - reports always has had some drainge at site GI aware, reviewed sign of infection, flushes fine, weight stable    Genitourinary Additional Genitourinary Details: pure wik at night, reports incontinence improving during day with toileting schedule    Integumentary Integumentary Symptoms Reported: No symptoms reported    Musculoskeletal Musculoskelatal Symptoms Reviewed: Difficulty walking, Limited mobility, Unsteady gait Additional Musculoskeletal Details: walker at all times, PT 2x week. walks slow getting stronger with therapy Musculoskeletal Management Strategies: Medical device, Exercise Musculoskeletal Comment: last fall in rehab now over year ago Falls in the past year?: No Number of falls in past year: 1 or less Was there an injury with Fall?: No Fall Risk Category Calculator: 0 Patient Fall Risk Level: Low Fall Risk Patient at Risk for Falls Due to: History of fall(s), Impaired mobility, Mental status change Fall risk Follow up: Falls evaluation completed, Falls prevention discussed, Education provided  Psychosocial Psychosocial Symptoms Reported: No symptoms reported Additional Psychological Details: Mom reports calm, Declines need for RNCM will continue with LCSW, has direct contact number if needs arise and aware can request LCSW place on RN schedule as needed Behavioral Management Strategies: Adequate rest, Exercise, Optimal nutrition intake Behaviors When Feeling Stressed/Fearful: none noted by mother      02/27/2024    PHQ2-9 Depression Screening   Little interest or  pleasure in doing things Not at all (does not have interest in doing things, unsure if know what is really going on around her, if  Mom tries to get her to do something she does it)  Feeling down, depressed, or hopeless Not at all  PHQ-2 - Total Score 0  Trouble falling or staying asleep, or sleeping too much    Feeling tired or having little energy    Poor appetite or overeating     Feeling bad about yourself - or that you are a failure or have let yourself or your family down    Trouble concentrating on things, such as reading the newspaper or watching television    Moving or speaking so slowly that other people could have noticed.  Or the opposite - being so fidgety or restless that you have been moving around a lot more than usual    Thoughts that you would be better off dead, or hurting yourself in some way    PHQ2-9 Total Score    If you checked off any problems, how difficult have these problems made it for you to do your work, take care of things at home, or get along with other people    Depression Interventions/Treatment      There were no vitals filed for this visit.    Medications Reviewed Today     Reviewed by Devra Lands, RN (Registered Nurse) on 02/27/24 at 1108  Med List Status: <None>   Medication Order Taking? Sig Documenting Provider Last Dose Status Informant  atorvastatin  (LIPITOR ) 40 MG tablet [id number] Yes TAKE 1 TABLET BY MOUTH EVERY DAY Hammock, Sheri, NP  Active   carbidopa -levodopa  (SINEMET  IR) 25-100 MG tablet [id number]  TAKE 1 TABLET BY MOUTH THREE TIMES A DAY. NEED PRIMARY INSURANCE Babs Arthea DASEN, MD  Active   carvedilol  (COREG ) 3.125 MG tablet [id number] Yes TAKE 1 TABLET BY MOUTH 2 TIMES DAILY. Gerard Frederick, NP  Active   clobetasol (TEMOVATE) 0.05 % external solution [id number] Yes Apply topically 2 (two) times daily. [provider]  Active   [facility] ASPIRIN  ADULT LOW DOSE 81 MG chewable tablet [id number] Yes PLACE 1 TABLET (81 MG TOTAL) INTO  FEEDING TUBE DAILY. Mady [name], MD  Active   Ensure Plus (ENSURE PLUS) BERNICE [id number] Yes Take by mouth. Drinks 5-6 cans per day  Patient taking differently: Take by mouth. Drinks 5-6 cans per day via G tube [employer], Historical, MD  Active   Melatonin 5 MG CAPS [id number] Yes Place 5 mg into feeding tube at bedtime. [provider]  Active Mother  Multiple Vitamin (MULTIVITAMIN WITH MINERALS) TABS tablet [id number] Yes Place 1 tablet into feeding tube daily. [provider]  Active Mother  scopolamine  (TRANSDERM-SCOP) 1 MG/3DAYS [id number] Yes PLACE 1 PATCH ONTO THE SKIN EVERY 3 DAYS. Gerard Frederick, NP  Active   traZODone  (DESYREL ) 100 MG tablet [id number] Yes PLACE 1 TABLET (100 MG TOTAL) INTO FEEDING TUBE AT BEDTIME. Babs Arthea DASEN, MD  Active   triamcinolone cream (KENALOG) 0.1 % [id number] Yes SMARTSIG:Sparingly Topical Twice Daily [provider]  Active   Water  For Irrigation, Sterile (FREE WATER ) SOLN [id number] Yes Place 200 mLs into feeding tube every 6 (six) hours. Alto Isaiah CROME, NP  Active Mother            Recommendation:   PCP Follow-up Specialty provider follow-up Cardiology Continue Current Plan of Care  Follow Up Plan:   Closing from [facility], will continue with LCSW  Dainel Arcidiacono, MSN, RN [facility]  [facility], [facility] RN Care Manager Direct Dial: [phone] Fax: [phone]

## 2024-02-28 ENCOUNTER — Telehealth: Payer: Self-pay

## 2024-02-28 ENCOUNTER — Encounter: Payer: Self-pay | Admitting: Internal Medicine

## 2024-02-28 DIAGNOSIS — I5020 Unspecified systolic (congestive) heart failure: Secondary | ICD-10-CM | POA: Insufficient documentation

## 2024-02-28 NOTE — Telephone Encounter (Signed)
 Attempted to call Myrick, legal guardian, no answer.  LMTCB.  Need to know if they have received the tube feeding supplies.   [facility] and spoke with Jessia who reported that supplies were shipped out 02/18/24.  Want to be sure that they have needed supplies.

## 2024-02-28 NOTE — Telephone Encounter (Signed)
 Noted! Thank you

## 2024-02-28 NOTE — Telephone Encounter (Signed)
 Copied from CRM #[id number]. Topic: General - Other >> Feb 28, 2024 10:40 AM Zebedee SAUNDERS wrote: Reason for CRM: [name] legal guardian Janice Katos [phone] called stated someone call her but she could not make out the name. Please call Ms. Hanner.

## 2024-02-28 NOTE — Addendum Note (Signed)
 Addended by: SEBASTIAN KNEE on: 02/28/2024 10:59 AM   Modules accepted: Orders

## 2024-02-28 NOTE — Telephone Encounter (Signed)
 noted

## 2024-02-28 NOTE — Telephone Encounter (Signed)
 Called and spoke with [employer], they reported that they are trying to run the Enteral supplies through [facility].     Called [employer] to initiate PA for enteral supplies and feeding supplements.  Spoke with Odis T, who checked benefits for procedure codes listed on order and PA is not required.  Odis explained that patient needs a referral for DME.   Referral for DME created as urgent.  Sending message to STAT chat in TEAMS for assistance in getting [facility] Portal updated so that pt can get supplies.

## 2024-02-29 NOTE — Telephone Encounter (Signed)
 Per Beverly [name] the Patient has already received supplies and this issue has been handled. D.

## 2024-03-03 ENCOUNTER — Ambulatory Visit

## 2024-03-03 ENCOUNTER — Encounter

## 2024-03-04 ENCOUNTER — Telehealth: Admitting: *Deleted

## 2024-03-05 ENCOUNTER — Ambulatory Visit

## 2024-03-10 ENCOUNTER — Other Ambulatory Visit: Payer: Self-pay | Admitting: *Deleted

## 2024-03-10 ENCOUNTER — Encounter

## 2024-03-10 ENCOUNTER — Ambulatory Visit

## 2024-03-12 ENCOUNTER — Ambulatory Visit

## 2024-03-13 ENCOUNTER — Ambulatory Visit

## 2024-03-14 LAB — CUP PACEART REMOTE DEVICE CHECK
Battery Remaining Percentage: 58 %
Date Time Interrogation Session: 20260205082800
HighPow Impedance: 145 Ohm
Implantable Pulse Generator Implant Date: 20221021
Pulse Gen Serial Number: 161805

## 2024-03-17 ENCOUNTER — Ambulatory Visit

## 2024-03-17 ENCOUNTER — Encounter

## 2024-03-19 ENCOUNTER — Ambulatory Visit

## 2024-03-24 ENCOUNTER — Encounter

## 2024-03-24 ENCOUNTER — Ambulatory Visit

## 2024-03-26 ENCOUNTER — Ambulatory Visit

## 2024-03-26 ENCOUNTER — Telehealth: Admitting: *Deleted

## 2024-03-31 ENCOUNTER — Ambulatory Visit

## 2024-03-31 ENCOUNTER — Encounter

## 2024-04-02 ENCOUNTER — Ambulatory Visit

## 2024-04-07 ENCOUNTER — Ambulatory Visit: Attending: Physical Medicine & Rehabilitation | Admitting: Physical Therapy

## 2024-04-07 ENCOUNTER — Encounter

## 2024-04-09 ENCOUNTER — Ambulatory Visit

## 2024-04-14 ENCOUNTER — Encounter

## 2024-04-14 ENCOUNTER — Ambulatory Visit

## 2024-04-16 ENCOUNTER — Ambulatory Visit

## 2024-05-07 ENCOUNTER — Encounter: Admitting: Physical Medicine & Rehabilitation

## 2024-05-16 ENCOUNTER — Ambulatory Visit: Admitting: Nurse Practitioner

## 2024-06-12 ENCOUNTER — Ambulatory Visit

## 2024-09-11 ENCOUNTER — Ambulatory Visit

## 2024-12-11 ENCOUNTER — Ambulatory Visit

## 2025-03-12 ENCOUNTER — Ambulatory Visit

## 2025-06-11 ENCOUNTER — Ambulatory Visit
